# Patient Record
Sex: Male | Born: 1965
Health system: Southern US, Community
[De-identification: ages and names within clinical notes are randomized; demographics above are authoritative.]

## PROBLEM LIST (undated history)

## (undated) DIAGNOSIS — F1020 Alcohol dependence, uncomplicated: Secondary | ICD-10-CM

## (undated) DIAGNOSIS — F419 Anxiety disorder, unspecified: Secondary | ICD-10-CM

## (undated) DIAGNOSIS — F32A Depression, unspecified: Secondary | ICD-10-CM

---

## 1999-09-06 ENCOUNTER — Emergency Department (HOSPITAL_COMMUNITY): Admission: EM | Admit: 1999-09-06 | Discharge: 1999-09-06 | Payer: Self-pay | Admitting: Emergency Medicine

## 1999-09-06 ENCOUNTER — Encounter: Payer: Self-pay | Admitting: Emergency Medicine

## 1999-09-15 ENCOUNTER — Emergency Department (HOSPITAL_COMMUNITY): Admission: EM | Admit: 1999-09-15 | Discharge: 1999-09-15 | Payer: Self-pay | Admitting: Emergency Medicine

## 2000-05-25 ENCOUNTER — Encounter: Payer: Self-pay | Admitting: Emergency Medicine

## 2000-05-25 ENCOUNTER — Emergency Department (HOSPITAL_COMMUNITY): Admission: EM | Admit: 2000-05-25 | Discharge: 2000-05-25 | Payer: Self-pay | Admitting: Emergency Medicine

## 2004-06-05 ENCOUNTER — Emergency Department (HOSPITAL_COMMUNITY): Admission: EM | Admit: 2004-06-05 | Discharge: 2004-06-05 | Payer: Self-pay | Admitting: Emergency Medicine

## 2009-07-21 ENCOUNTER — Emergency Department (HOSPITAL_COMMUNITY): Admission: EM | Admit: 2009-07-21 | Discharge: 2009-07-21 | Payer: Self-pay | Admitting: Emergency Medicine

## 2009-08-02 ENCOUNTER — Emergency Department (HOSPITAL_COMMUNITY): Admission: EM | Admit: 2009-08-02 | Discharge: 2009-08-03 | Payer: Self-pay | Admitting: Emergency Medicine

## 2009-11-03 ENCOUNTER — Emergency Department (HOSPITAL_COMMUNITY): Admission: EM | Admit: 2009-11-03 | Discharge: 2009-11-04 | Payer: Self-pay | Admitting: Emergency Medicine

## 2009-11-04 ENCOUNTER — Ambulatory Visit: Payer: Self-pay | Admitting: Psychiatry

## 2009-11-04 ENCOUNTER — Inpatient Hospital Stay (HOSPITAL_COMMUNITY): Admission: AD | Admit: 2009-11-04 | Discharge: 2009-11-09 | Payer: Self-pay | Admitting: Psychiatry

## 2010-06-26 LAB — COMPREHENSIVE METABOLIC PANEL
ALT: 35 U/L (ref 0–53)
Alkaline Phosphatase: 59 U/L (ref 39–117)
CO2: 26 mEq/L (ref 19–32)
Chloride: 100 mEq/L (ref 96–112)
Glucose, Bld: 113 mg/dL — ABNORMAL HIGH (ref 70–99)
Potassium: 3.9 mEq/L (ref 3.5–5.1)
Sodium: 138 mEq/L (ref 135–145)
Total Bilirubin: 0.1 mg/dL — ABNORMAL LOW (ref 0.3–1.2)
Total Protein: 8.6 g/dL — ABNORMAL HIGH (ref 6.0–8.3)

## 2010-06-26 LAB — RAPID URINE DRUG SCREEN, HOSP PERFORMED
Benzodiazepines: NOT DETECTED
Cocaine: NOT DETECTED
Tetrahydrocannabinol: NOT DETECTED

## 2010-06-26 LAB — CBC
HCT: 40.7 % (ref 39.0–52.0)
Hemoglobin: 14.4 g/dL (ref 13.0–17.0)
RBC: 4.56 MIL/uL (ref 4.22–5.81)
WBC: 4.3 10*3/uL (ref 4.0–10.5)

## 2010-06-26 LAB — DIFFERENTIAL
Basophils Relative: 1 % (ref 0–1)
Eosinophils Absolute: 0.2 10*3/uL (ref 0.0–0.7)
Monocytes Absolute: 0.5 10*3/uL (ref 0.1–1.0)
Neutrophils Relative %: 16 % — ABNORMAL LOW (ref 43–77)

## 2010-06-26 LAB — ACETAMINOPHEN LEVEL: Acetaminophen (Tylenol), Serum: <10 ug/mL — ABNORMAL LOW (ref 10–30)

## 2010-06-26 LAB — ETHANOL: Alcohol, Ethyl (B): 362 mg/dL — ABNORMAL HIGH (ref 0–10)

## 2010-06-29 LAB — RAPID URINE DRUG SCREEN, HOSP PERFORMED
Amphetamines: NOT DETECTED
Barbiturates: NOT DETECTED
Opiates: POSITIVE — AB

## 2010-06-29 LAB — URINE MICROSCOPIC-ADD ON

## 2010-06-29 LAB — POCT I-STAT, CHEM 8
BUN: 12 mg/dL (ref 6–23)
Chloride: 102 mEq/L (ref 96–112)
Creatinine, Ser: 1 mg/dL (ref 0.4–1.5)
Glucose, Bld: 106 mg/dL — ABNORMAL HIGH (ref 70–99)
Hemoglobin: 14.6 g/dL (ref 13.0–17.0)
Potassium: 3.6 mEq/L (ref 3.5–5.1)
Sodium: 137 mEq/L (ref 135–145)

## 2010-06-29 LAB — URINALYSIS, ROUTINE W REFLEX MICROSCOPIC
Bilirubin Urine: NEGATIVE
Glucose, UA: NEGATIVE mg/dL
Urobilinogen, UA: 0.2 mg/dL (ref 0.0–1.0)
pH: 5.5 (ref 5.0–8.0)

## 2010-06-30 LAB — BASIC METABOLIC PANEL
BUN: 7 mg/dL (ref 6–23)
Creatinine, Ser: 0.94 mg/dL (ref 0.4–1.5)
GFR calc non Af Amer: >60 mL/min (ref >60)
Glucose, Bld: 156 mg/dL — ABNORMAL HIGH (ref 70–99)
Potassium: 3.8 mEq/L (ref 3.5–5.1)

## 2010-06-30 LAB — RAPID URINE DRUG SCREEN, HOSP PERFORMED
Amphetamines: NOT DETECTED
Benzodiazepines: NOT DETECTED
Cocaine: NOT DETECTED
Opiates: NOT DETECTED
Tetrahydrocannabinol: NOT DETECTED

## 2010-06-30 LAB — CBC
HCT: 39.3 % (ref 39.0–52.0)
MCV: 90.3 fL (ref 78.0–100.0)
Platelets: 78 10*3/uL — ABNORMAL LOW (ref 150–400)
RDW: 15.1 % (ref 11.5–15.5)
WBC: 2.6 10*3/uL — ABNORMAL LOW (ref 4.0–10.5)

## 2010-06-30 LAB — DIFFERENTIAL
Basophils Absolute: 0.1 10*3/uL (ref 0.0–0.1)
Lymphs Abs: 1.6 10*3/uL (ref 0.7–4.0)
Monocytes Absolute: 0.3 10*3/uL (ref 0.1–1.0)
Monocytes Relative: 11 % (ref 3–12)
Neutrophils Relative %: 15 % — ABNORMAL LOW (ref 43–77)
Smear Review: DECREASED

## 2010-06-30 LAB — ETHANOL: Alcohol, Ethyl (B): 391 mg/dL — ABNORMAL HIGH (ref 0–10)

## 2010-06-30 LAB — PATHOLOGIST SMEAR REVIEW

## 2011-09-22 ENCOUNTER — Emergency Department (HOSPITAL_COMMUNITY)
Admission: EM | Admit: 2011-09-22 | Discharge: 2011-09-22 | Discharge status: Against Medical Advice | Payer: Self-pay | Attending: Emergency Medicine | Admitting: Emergency Medicine

## 2011-09-22 ENCOUNTER — Encounter (HOSPITAL_COMMUNITY): Payer: Self-pay | Admitting: Emergency Medicine

## 2011-09-22 DIAGNOSIS — Z0389 Encounter for observation for other suspected diseases and conditions ruled out: Secondary | ICD-10-CM | POA: Insufficient documentation

## 2011-09-22 NOTE — ED Notes (Signed)
Pt not in room when this nurse went to assess him. Charge nurse reported that if he walked out again he would be escorted off premises by security, which he did. Pt was ambulatory at time of leaving.

## 2011-09-22 NOTE — ED Notes (Signed)
Pt presents via EMS intoxicated with the hiccups, and n/v. Pt wants to leave. No active vomiting.

## 2013-11-27 ENCOUNTER — Ambulatory Visit: Payer: Self-pay

## 2013-12-12 ENCOUNTER — Ambulatory Visit: Payer: Self-pay

## 2014-06-14 ENCOUNTER — Emergency Department (HOSPITAL_COMMUNITY): Payer: Self-pay

## 2014-06-14 ENCOUNTER — Emergency Department (HOSPITAL_COMMUNITY)
Admission: EM | Admit: 2014-06-14 | Discharge: 2014-06-14 | Disposition: A | Discharge status: Home / Self Care | Payer: Self-pay | Attending: Emergency Medicine | Admitting: Emergency Medicine

## 2014-06-14 ENCOUNTER — Encounter (HOSPITAL_COMMUNITY): Payer: Self-pay | Admitting: Emergency Medicine

## 2014-06-14 DIAGNOSIS — Y9389 Activity, other specified: Secondary | ICD-10-CM | POA: Insufficient documentation

## 2014-06-14 DIAGNOSIS — Y9289 Other specified places as the place of occurrence of the external cause: Secondary | ICD-10-CM | POA: Insufficient documentation

## 2014-06-14 DIAGNOSIS — Z720 Tobacco use: Secondary | ICD-10-CM | POA: Insufficient documentation

## 2014-06-14 DIAGNOSIS — S82099A Other fracture of unspecified patella, initial encounter for closed fracture: Secondary | ICD-10-CM | POA: Insufficient documentation

## 2014-06-14 DIAGNOSIS — Y990 Civilian activity done for income or pay: Secondary | ICD-10-CM | POA: Insufficient documentation

## 2014-06-14 DIAGNOSIS — W1830XA Fall on same level, unspecified, initial encounter: Secondary | ICD-10-CM | POA: Insufficient documentation

## 2014-06-14 DIAGNOSIS — S82001A Unspecified fracture of right patella, initial encounter for closed fracture: Secondary | ICD-10-CM

## 2014-06-14 MED ORDER — HYDROCODONE-ACETAMINOPHEN 5-325 MG PO TABS: 1.0000 | ORAL_TABLET | Freq: Four times a day (QID) | ORAL | Status: DC | PRN

## 2014-06-14 MED ORDER — IBUPROFEN 800 MG PO TABS: 800.0000 mg | ORAL_TABLET | Freq: Three times a day (TID) | ORAL | Status: DC | PRN

## 2014-06-14 NOTE — ED Notes (Signed)
Pt c/o rigth knee pain and swelling after falling last week.  Pt ambulated to triage room.

## 2014-06-14 NOTE — ED Notes (Signed)
Ortho tech at bedside 

## 2014-06-14 NOTE — Discharge Instructions (Signed)
Patellar Fracture with Rehab Patellar fracture is a break in the kneecap (patella). These fractures may be complete or incomplete. It is common for injuries that cause a patellar fracture to also cause a tear (sprain) of the ligaments or ligament-like tissue (retinaculum), or a tear (strain) in the patellar tendon. SYMPTOMS   Severe pain in the knee at the time of injury.  Tenderness and swelling in the knee.  Pain with movement of the knee.  Inability to straighten a bent knee by its own power.  Catching or locking of the knee.  Bleeding and bruising (contusion) in the knee.  Difficulty bearing weight on the injured limb, especially when trying to get up from a sitting position, go up or down stairs, or jump.  Visible deformity, if the bone fragments are out of alignment (displaced).  Numbness and coldness in the leg and foot beyond the fracture site, if the blood supply is impaired. CAUSES  Patellar fractures occur when a force is placed on the kneecap that is greater than it can handle. Common causes of injury include:  Direct trauma to the patella.  Indirect stress caused by twisting or bending. RISK INCREASES WITH:   Contact sports (football, hockey, soccer).  Basketball.  Motor sports.  Bony abnormalities or diseases of the bone (osteoporosis, bone tumor).  Metabolism disorders, hormone problems, nutrition deficiencies and disorders (anorexia or bulimia).  Poor strength and flexibility. PREVENTION  Warm up and stretch properly before activity.  Maintain physical fitness:  Strength, flexibility, and endurance.  Cardiovascular fitness.  Wear properly fitted and padded protective equipment (knee pads). PROGNOSIS  If treated properly, patellar fractures usually heal.  RELATED COMPLICATIONS   Fracture fails to heal (nonunion).  Fracture heals in a poor position (malunion).  Bone death (necrosis), due to interruption of blood supply to the bone.  Stopping  of normal bone growth in children.  Risks of surgery: infection, bleeding, injury to nerves (numbness, weakness, paralysis), need for further surgery, and pain from the wires or screws used to fix the fracture.  Infection in the skin, broken over the fracture site (open fractures).  Arthritic knee joint.  Longer healing time, if activity is resumed too soon.  Proneness to repeated knee injury.  After healing, risk of roughened contact surface of the kneecap, causing pain with sitting, when rising from a sitting position, when going up or down stairs or hills, and when jumping or running.  Stiff knee.  Unstable kneecap. TREATMENT  Treatment first involves the use of ice and medicine to reduce pain and inflammation. If the bone fragments are out of alignment (displaced), immediate realigning of the bone (reduction) by a person trained in the procedure is required. Fractures that cannot be realigned by hand, or are open (bones protrude through the skin), may require surgery to hold the fracture in place with screws, pins, and plates. Once the bone is in proper alignment, restraining the knee is often needed to allow for healing. After restraint, it is important to perform strengthening and stretching exercises to help regain strength and a full range of motion. These exercises may be completed at home or with a therapist. MEDICATION   If pain medicine is needed, nonsteroidal anti-inflammatory medicines (aspirin and ibuprofen), or other minor pain relievers (acetaminophen), are often advised.  Do not take pain medicine for 7 days before surgery.  Prescription pain relievers may be given if your caregiver thinks they are needed. Use only as directed and only as much as you need. SEEK   MEDICAL CARE IF:  Symptoms get worse or do not improve in 2 weeks, despite treatment.  The following occur after restraint or surgery. (Report any of these signs immediately):  Swelling above or below the  fracture site.  Severe, persistent pain.  Blue or gray skin below the fracture site or in the toes. Numbness or loss of feeling below the fracture site.  New, unexplained symptoms develop. (Drugs used in treatment may produce side effects.) EXERCISES RANGE OF MOTION (ROM) AND STRETCHING EXERCISES - Patellar Fracture Your fractured patella may be corrected with surgery and/or with a knee brace. Upon your caregiver's instructions, you may begin your rehabilitation with some of the following exercises. As you complete these, remember:   These initial exercises are intended to be gentle. They will help you restore motion without increasing any swelling.  Completing these exercises allows less painful movement and prepares you for the more aggressive strengthening exercises in Phase II.  An effective stretch should be held for at least 30 seconds.  A stretch should never be painful. You should only feel a gentle lengthening or release in the stretched tissue. RANGE OF MOTION - Knee Flexion and Extension, Active-Assisted  Sit on the edge of a table or chair with your thighs firmly supported. It may be helpful to place a folded towel under the end of your right / left thigh.  Flexion (bending): Place the ankle of your healthy leg on top of the other ankle. Use your healthy leg to gently bend your right / left knee until you feel a mild tension across the top of your knee.  Hold for __________ seconds.  Extension (straightening): Switch your ankles so your right / left leg is on top. Use your healthy leg to straighten your right / left knee until you feel a mild tension on the backside of your knee.  Hold for __________ seconds. Repeat __________ times. Complete this exercise __________ times per day. RANGE OF MOTION - Knee Flexion, Active  Lie on your back with both knees straight. (If this causes back discomfort, bend your opposite knee, placing your foot flat on the floor.)  Slowly slide  your heel back toward your buttocks until you feel a gentle stretch in the front of your knee or thigh.  Hold for __________ seconds. Slowly slide your heel back to the starting position. Repeat __________ times. Complete this exercise __________ times per day.  STRETCH - Knee Flexion, Supine  Lie on the floor with your right / left heel and foot lightly touching the wall. (Place both feet on the wall if you do not use a door frame.)  Without using any effort, allow gravity to slide your foot down the wall slowly until you feel a gentle stretch in the front of your right / left knee.  Hold this stretch for __________ seconds. Then return the leg to the starting position, using your health leg for help, if needed. Repeat __________ times. Complete this stretch __________ times per day.  STRETCH - Knee Extension Sitting  Sit with your right / left leg/heel propped on another chair, coffee table, or foot stool.  Allow your leg muscles to relax, letting gravity straighten out your knee.*  You should feel a stretch behind your right / left knee. Hold this position for __________ seconds. Repeat __________ times. Complete this stretch __________ times per day.  *Your physician, physical therapist or athletic trainer may instruct you place a __________ weight on your thigh, just above your kneecap,   to deepen the stretch.  STRETCH - Knee Extension, Prone  Lie on your stomach on a firm surface, such as a bed or countertop. Place your right / left knee and leg just beyond the edge of the surface. You may wish to place a towel under the far end of your right / left thigh for comfort.  Relax your leg muscles and allow gravity to straighten your knee. Your caregiver may advise you to add an ankle weight if more resistance is helpful for you.  You should feel a stretch in the back of your right / left knee. Hold this position for __________ seconds. Repeat __________ times. Complete this __________  times per day. *Your physician, physical therapist or athletic trainer may instruct you place a __________ weight on your ankle to deepen the stretch.  STRENGTHENING EXERCISES - Patellar Fracture  Regardless of whether your physician has stabilized your knee with surgery or a brace, he or she may have you begin some of the following exercises to rehabilitate your injury. The exercises may resolve your symptoms with or without further involvement from your physician, physical therapist, or athletic trainer. While completing these exercises, remember:   Muscles can gain both the endurance and the strength needed for everyday activities through controlled exercises.  Complete these exercises as instructed by your physician, physical therapist, or athletic trainer. Increase the resistance and repetitions only as guided by your caregiver. STRENGTH - Quadriceps, Isometrics  Lie on your back with your right / left leg extended and your opposite knee bent.  Gradually tense the muscles in the front of your right / left thigh. You should see either your kneecap slide up toward your hip or increased dimpling just above the knee. This motion will push the back of the knee down toward the floor, mat, or bed on which you are lying.  Hold the muscle as tight as you can, without increasing your pain, for __________ seconds.  Relax the muscles slowly and completely in between each repetition. Repeat __________ times. Complete this exercise __________ times per day.  STRENGTH - Quadriceps, Short Arcs  Lie on your back. Place a __________ inch towel roll under your right / left knee, so that the knee bends slightly.  Raise only your lower leg by tightening the muscles in the front of your thigh. Do not allow your thigh to rise.  Hold this position for __________ seconds. Repeat __________ times. Complete this exercise __________ times per day.  OPTIONAL ANKLE WEIGHTS: Begin with ____________________, but DO  NOT exceed ____________________. Increase in 1 lb/0.5 kg increments.  STRENGTH - Quadriceps, Straight Leg Raises Quality counts! Watch for signs that the quadriceps muscle is working, to be sure you are strengthening the correct muscles and not "cheating" by substituting with healthier muscles.  Lay on your back with your right / left leg extended and your opposite knee bent.  Tense the muscles in the front of your right / left thigh. You should see either your kneecap slide up or increased dimpling just above the knee. Your thigh may even shake a bit.  Tighten these muscles even more and raise your leg 4 to 6 inches off the floor. Hold for __________ seconds.  Keeping these muscles tense, lower your leg.  Relax the muscles slowly and completely between each repetition. Repeat __________ times. Complete this exercise __________ times per day.  STRENGTH - Hamstring, Isometrics  Lie on your back on a firm surface.  Bend your right / left knee   approximately __________ degrees.  Dig your heel into the surface as if you are trying to pull it toward your buttocks. Tighten the muscles in the back of your thighs to "dig" as hard as you can without increasing any pain.  Hold this position for __________ seconds.  Release the tension gradually and allow your muscle to completely relax for __________ seconds between each exercise. Repeat __________ times. Complete this exercise __________ times per day.  STRENGTH - Hamstring, Curls  Lay on your stomach with your legs extended. (If you lay on a bed, your feet may hang over the edge.)  Tighten the muscles in the back of your thigh to bend your right / left knee up to 90 degrees. Keep your hips flat on the bed.  Hold this position for __________ seconds.  Slowly lower your leg back to the starting position. Repeat __________ times. Complete this exercise __________ times per day.  STRENGTH - Hip Extensors, Bridge  Lie on your back on a firm  surface. Bend your knees and place your feet flat on the floor.  Tighten your buttocks muscles and lift your bottom off the floor until your trunk is level with your thighs. You should feel the muscles in your buttocks and back of your thighs working. If you do not feel these muscles, slide your feet 1-2 inches further away from your buttocks.  Hold this position for __________ seconds.  Slowly lower your hips to the starting position and allow your buttock muscles relax completely before beginning the next repetition.  If this exercise is too easy, you may cross your arms over your chest. Repeat __________ times. Complete this exercise __________ times per day.  STRENGTH - Hip Abductors, Straight Leg Raises Be aware of your form throughout the entire exercise, so that you exercise the correct muscles. Poor form means that you are not strengthening the correct muscles.  Lie on your side so that your head, shoulders, knee and hip line up. You may bend your lower knee to help maintain your balance. Your right / left leg should be on top.  Roll your hips slightly forward, so that your hips are stacked directly over each other and your right / left knee is facing forward.  Lift your top leg up 4 to 6 inches, leading with your heel. Be sure that your foot does not drift forward or that your knee does not roll toward the ceiling.  Hold this position for __________ seconds. You should feel the muscles in your outer hip lifting. (You may not notice this until your leg begins to tire.)  Slowly lower your leg to the starting position. Allow the muscles to fully relax before beginning the next repetition. Repeat __________ times. Complete this exercise __________ times per day.  STRENGTH - Hip Abductors, Quadruped  On a firm, lightly padded surface, position yourself on your hands and knees. Your hands should be directly below your shoulders and your knees should be directly below your hips.  Keeping  your right / left knee bent, lift your leg out to the side. Keep your legs level and in line with your shoulders.  Hold for __________ seconds.  Keeping your trunk steady and your hips level, slowly lower your leg to the starting position. Repeat __________ times. Complete this exercise __________ times per day.  STRENGTH - Quadriceps, Squats  Stand in a door frame so that your feet and knees are in line with the frame.  Use your hands for balance, not   support, on the frame.  Slowly lower your weight, bending at the hips and knees. Keep your lower legs upright so that they are parallel with the door frame. Squat only within the range that does not increase your knee pain. Never let your hips drop below your knees.  Slowly return upright, pushing with your legs, not pulling with your hands. Repeat __________ times. Complete this exercise __________ times per day.  STRENGTH - Plantar-flexors, Standing  Stand with your feet shoulder width apart. Steady yourself with a wall or table, using as little support as needed.  Keeping your weight evenly spread over the width of your feet, rise up on your toes.*  Hold this position for __________ seconds. Repeat exercise __________ times, __________ times per day.  *If this is too easy, shift your weight toward your right / left leg until you feel challenged. Ultimately, you may be asked to do this exercise while standing on your right / left foot only. Document Released: 03/28/2005 Document Revised: 08/12/2013 Document Reviewed: 07/10/2008 [employer] Patient Information 2015 [employer]. This information is not intended to replace advice given to you by your health care provider. Make sure you discuss any questions you have with your health care provider.  

## 2014-06-14 NOTE — ED Provider Notes (Signed)
CSN: [id number]     Arrival date & time 06/14/14  1510 History  This chart was scribed for non-physician practitioner, Jake Helper, PA-C,working with Flint Melter, MD, by Karle Plumber, ED Scribe. This patient was seen in room WTR9/WTR9 and the patient's care was started at 4:59 PM.  Chief Complaint  Patient presents with  . Knee Pain   Patient is a 49 y.o. male presenting with knee pain. The history is provided by the patient and medical records. No language interpreter was used.  Knee Pain   HPI Comments:  Jake Nguyen is a 49 y.o. male who presents to the Emergency Department complaining of severe right knee pain and swelling secondary to falling on the knee from a standing position on to concrete at work approximately six days ago. He reports associated stiffness of the knee while sleeping at night. Walking makes the pain worse. Denies alleviating factors. He applied ice and warm compresses with minimal relief of the symptoms. He denies right ankle or right hip pain, numbness, tingling or weakness of the LLE.  History reviewed. No pertinent past medical history. History reviewed. No pertinent past surgical history. No family history on file. History  Substance Use Topics  . Smoking status: Current Every Day Smoker    Types: Cigarettes  . Smokeless tobacco: Not on file  . Alcohol Use: Yes    Review of Systems  Musculoskeletal: Positive for joint swelling and arthralgias.  Skin: Negative for color change and wound.  Neurological: Negative for weakness and numbness.    Allergies  Review of patient's allergies indicates no known allergies.  Home Medications   Prior to Admission medications   Not on File   Triage Vitals: BP 139/95 mmHg  Pulse 91  Temp(Src) 98.1 F (36.7 C) (Oral)  Resp 17  Ht  (1.778 m)  Wt 163 lb (73.936 kg)  BMI 23.39 kg/m2  SpO2 100% Physical Exam  Constitutional: He is oriented to person, place, and time. He appears well-developed and  well-nourished.  HENT:  Head: Normocephalic and atraumatic.  Eyes: EOM are normal.  Neck: Normal range of motion.  Cardiovascular: Normal rate and intact distal pulses.   Pulmonary/Chest: Effort normal.  Musculoskeletal: Normal range of motion. He exhibits tenderness.  No joint laxity of right knee. Tender to palpation of both medial and lateral right knee. No tenderness over patella. No gross deformity. Normal flexion and extension of right knee. Normal right hip and ankle.   Neurological: He is alert and oriented to person, place, and time.  Skin: Skin is warm and dry.  Psychiatric: He has a normal mood and affect. His behavior is normal.  Nursing note and vitals reviewed.   ED Course  Procedures (including critical care time) DIAGNOSTIC STUDIES: Oxygen Saturation is 100% on RA, normal by my interpretation.   COORDINATION OF CARE: 5:06 PM- Informed pt of fractured patella. Will order crutches and knee immobilizer and give ortho referral. Pt verbalizes understanding and agrees to plan.  Medications - No data to display  Labs Review Labs Reviewed - No data to display  Imaging Review Dg Knee Complete 4 Views Right  06/14/2014   CLINICAL DATA:  Initial evaluation for pain in right knee around the patella due to a fall 6 days ago at work  EXAM: RIGHT KNEE - COMPLETE 4+ VIEW  COMPARISON:  None.  FINDINGS: Small moderate joint effusion. Vertical defect in the lateral patella consistent with nondisplaced fracture. Prominent nutrient foramen proximal fibula. Tibia, fibula,  and femur otherwise normal.  IMPRESSION: Patellar fracture   Electronically Signed   By: Esperanza Heiraymond  Rubner M.D.   On: 06/14/2014 16:22     EKG Interpretation None      MDM   Final diagnoses:  Right patella fracture, closed, initial encounter    BP 139/95 mmHg  Pulse 91  Temp(Src) 98.1 F (36.7 C) (Oral)  Resp 17  Ht 5\' 10"  (1.778 m)  Wt 163 lb (73.936 kg)  BMI 23.39 kg/m2  SpO2 100%  I have reviewed  nursing notes and vital signs. I personally reviewed the imaging tests through PACS system  I reviewed available ER/hospitalization records thought the EMR   I personally performed the services described in this documentation, which was scribed in my presence. The recorded information has been reviewed and is accurate.    Jake HelperBowie Kamy Poinsett, PA-C 06/14/14 1712  Flint MelterElliott L Wentz, MD 06/14/14 [id number]

## 2014-08-19 ENCOUNTER — Ambulatory Visit: Payer: Self-pay | Attending: Internal Medicine

## 2014-08-25 ENCOUNTER — Ambulatory Visit: Payer: Self-pay

## 2016-02-02 ENCOUNTER — Encounter (HOSPITAL_COMMUNITY): Payer: Self-pay | Admitting: *Deleted

## 2016-02-02 ENCOUNTER — Emergency Department (HOSPITAL_COMMUNITY)
Admission: EM | Admit: 2016-02-02 | Discharge: 2016-02-02 | Discharge status: Against Medical Advice | Payer: Self-pay | Attending: Emergency Medicine | Admitting: Emergency Medicine

## 2016-02-02 DIAGNOSIS — F10120 Alcohol abuse with intoxication, uncomplicated: Secondary | ICD-10-CM | POA: Insufficient documentation

## 2016-02-02 DIAGNOSIS — Z5181 Encounter for therapeutic drug level monitoring: Secondary | ICD-10-CM | POA: Insufficient documentation

## 2016-02-02 DIAGNOSIS — F10920 Alcohol use, unspecified with intoxication, uncomplicated: Secondary | ICD-10-CM

## 2016-02-02 DIAGNOSIS — F17210 Nicotine dependence, cigarettes, uncomplicated: Secondary | ICD-10-CM | POA: Insufficient documentation

## 2016-02-02 LAB — COMPREHENSIVE METABOLIC PANEL
ALBUMIN: 4.7 g/dL (ref 3.5–5.0)
ALT: 16 U/L — AB (ref 17–63)
AST: 31 U/L (ref 15–41)
Alkaline Phosphatase: 54 U/L (ref 38–126)
Anion gap: 10 (ref 5–15)
BUN: 7 mg/dL (ref 6–20)
CHLORIDE: 104 mmol/L (ref 101–111)
CO2: 28 mmol/L (ref 22–32)
CREATININE: 0.72 mg/dL (ref 0.61–1.24)
Calcium: 8.8 mg/dL — ABNORMAL LOW (ref 8.9–10.3)
GFR calc Af Amer: >60 mL/min (ref >60)
GFR calc non Af Amer: >60 mL/min (ref >60)
GLUCOSE: 124 mg/dL — AB (ref 65–99)
POTASSIUM: 3.6 mmol/L (ref 3.5–5.1)
SODIUM: 142 mmol/L (ref 135–145)
Total Bilirubin: 0.3 mg/dL (ref 0.3–1.2)
Total Protein: 8.9 g/dL — ABNORMAL HIGH (ref 6.5–8.1)

## 2016-02-02 LAB — RAPID URINE DRUG SCREEN, HOSP PERFORMED
AMPHETAMINES: NOT DETECTED
BENZODIAZEPINES: NOT DETECTED
Barbiturates: NOT DETECTED
Cocaine: NOT DETECTED
Opiates: NOT DETECTED
TETRAHYDROCANNABINOL: POSITIVE — AB

## 2016-02-02 LAB — CBC
HEMATOCRIT: 41.9 % (ref 39.0–52.0)
HEMOGLOBIN: 14.3 g/dL (ref 13.0–17.0)
MCH: 30.9 pg (ref 26.0–34.0)
MCHC: 34.1 g/dL (ref 30.0–36.0)
MCV: 90.5 fL (ref 78.0–100.0)
PLATELETS: 168 10*3/uL (ref 150–400)
RBC: 4.63 MIL/uL (ref 4.22–5.81)
RDW: 13.8 % (ref 11.5–15.5)
WBC: 5.9 10*3/uL (ref 4.0–10.5)

## 2016-02-02 LAB — ETHANOL
ALCOHOL ETHYL (B): 439 mg/dL — AB (ref <5)
ALCOHOL ETHYL (B): 376 mg/dL — AB (ref <5)

## 2016-02-02 LAB — ACETAMINOPHEN LEVEL: Acetaminophen (Tylenol), Serum: <10 ug/mL — ABNORMAL LOW (ref 10–30)

## 2016-02-02 LAB — SALICYLATE LEVEL: Salicylate Lvl: <7 mg/dL (ref 2.8–30.0)

## 2016-02-02 MED ORDER — ONDANSETRON HCL 4 MG PO TABS: 4.0000 mg | ORAL_TABLET | Freq: Three times a day (TID) | ORAL | Status: DC | PRN

## 2016-02-02 MED ORDER — ALUM & MAG HYDROXIDE-SIMETH 200-200-20 MG/5ML PO SUSP: 30.0000 mL | ORAL | Status: DC | PRN

## 2016-02-02 MED ORDER — LORAZEPAM 1 MG PO TABS: 1.0000 mg | ORAL_TABLET | Freq: Three times a day (TID) | ORAL | Status: DC | PRN

## 2016-02-02 MED ORDER — LORAZEPAM 1 MG PO TABS: 0.0000 mg | ORAL_TABLET | Freq: Two times a day (BID) | ORAL | Status: DC

## 2016-02-02 MED ORDER — NICOTINE 21 MG/24HR TD PT24: 21.0000 mg | MEDICATED_PATCH | Freq: Every day | TRANSDERMAL | Status: DC

## 2016-02-02 MED ORDER — IBUPROFEN 200 MG PO TABS: 600.0000 mg | ORAL_TABLET | Freq: Three times a day (TID) | ORAL | Status: DC | PRN

## 2016-02-02 MED ORDER — LORAZEPAM 1 MG PO TABS
0.0000 mg | ORAL_TABLET | Freq: Four times a day (QID) | ORAL | Status: DC
Start: 2016-02-02 — End: 2016-02-02
  Administered 2016-02-02: 2 mg via ORAL
  Filled 2016-02-02: qty 2

## 2016-02-02 NOTE — ED Notes (Signed)
Pt is changed into scrubs, belongings collected and wanded by security.

## 2016-02-02 NOTE — BH Assessment (Addendum)
Assessment Note  Jake Nguyen is an 50 y.o. male. He presents to [facility] via EMS called by his girlfriend. Patient and girlfriend live in a hotel room together. Girlfriend contacted 911/EMS after patient voiced that he wanted to "End it all". Patient presents to the [facility] with both suicidal ideations and intoxicated. BAL upon arrival was 439.  Onset of suicidal thoughts started 2-3 days. No plan or intent. No history of suicidal ideations; thoughts only. No history of self mutilating behaviors. Patient admits that he has felt depressed with symptoms of hopelessness, isolating self from others, crying spells, and anger/irritability. He denies HI. No history of violent or aggressive behaviors. No AVH's per patient. Girlfriend however sts that patient "talks to himself all the time". No current psychiatrist or therapist. Patient has a history of INPT hospitalization at [facility]. Patient admits that he drinks heavily on a daily basis. He started drinking at the age of 29. Last drink was today. Denied drug use but UDS was + for THC.  Diagnosis:  Major Depressive Disorder, Recurrent, Severe, without psychotic features  Past Medical History: History reviewed. No pertinent past medical history.  History reviewed. No pertinent surgical history.  Family History: No family history on file.  Social History:  reports that he has been smoking Cigarettes.  He has been smoking about 0.50 packs per day. He has never used smokeless tobacco. He reports that he drinks alcohol. His drug history is not on file.   CIWA: CIWA-Ar BP: 137/87 Pulse Rate: 75 COWS:    Allergies:  Allergies  Allergen Reactions  . Pork-Derived Products Other (See Comments)    Pt states he doesn't eat pork    Additional Social History:  Alcohol / Drug Use Pain Medications: SEE MAR Prescriptions: SEE MAR Over the Counter: SEE MAR History of alcohol / drug use?: Yes Negative Consequences of Use: Financial, Personal  relationships Withdrawal Symptoms: Agitation, Sweats, Weakness Substance #1 Name of Substance 1: Alcohol  1 - Age of First Use: 50 yrs old 1 - Amount (size/oz): "I drink until I can't drink no more" 1 - Frequency: daily  1 - Duration: "all my life" 1 - Last Use / Amount: 02/02/2016  CIWA: CIWA-Ar BP: 109/76 Pulse Rate: 69 COWS:    Allergies:  Allergies  Allergen Reactions  . Pork-Derived Products Other (See Comments)    Pt states he doesn't eat pork    Home Medications:  (Not in a hospital admission)  OB/GYN Status:  No LMP for male patient.  General Assessment Data [facility] of Assessment: [facility] TTS Assessment: In system Is this a Tele or Face-to-Face Assessment?: Face-to-Face Is this an Initial Assessment or a Re-assessment for this encounter?: Initial Assessment Marital status: Single Maiden name:  (n/a) Is patient pregnant?: No Pregnancy Status: No Living Arrangements: Other (Comment) (lives in hotel with girlfriend ) Can pt return to current living arrangement?: Yes Admission Status: Voluntary Is patient capable of signing voluntary admission?: Yes Referral Source: Self/Family/Friend Insurance type:  (Self Pay )     Crisis Care Plan Living Arrangements: Other (Comment) (lives in hotel with girlfriend ) Legal Guardian: Other: (no legal guardian ) Name of Psychiatrist:  (no psychiatrist ) Name of Therapist:  (no therapist )  Education Status Is patient currently in school?: No Current Grade:  (n/a) Highest grade of school patient has completed:  (college) Name of school:  (n/a) Contact person:  (n/a)  Risk to self with the past 6 months Suicidal Ideation: Yes-Currently Present Has patient been  a risk to self within the past 6 months prior to admission? : Yes Suicidal Intent: No Has patient had any suicidal intent within the past 6 months prior to admission? : Yes Is patient at risk for suicide?: No Suicidal Plan?: No Has patient had any suicidal plan  within the past 6 months prior to admission? : Yes Access to Means: No What has been your use of drugs/alcohol within the last 12 months?:  (heavy alcohol use) Previous Attempts/Gestures: No How many times?:  (0) Other Self Harm Risks:  (denies ) Triggers for Past Attempts: Other (Comment) (no previous attempts or gestures; ideations only ) Intentional Self Injurious Behavior: None Family Suicide History: No Recent stressful life event(s): Other (Comment) ("Everything"; pt would not provide specific responses ) Persecutory voices/beliefs?: No Depression: Yes Depression Symptoms: Feeling angry/irritable, Feeling worthless/self pity, Loss of interest in usual pleasures, Guilt, Fatigue, Tearfulness, Isolating, Despondent, Insomnia Substance abuse history and/or treatment for substance abuse?: No Suicide prevention information given to non-admitted patients: Not applicable  Risk to Others within the past 6 months Homicidal Ideation: No Does patient have any lifetime risk of violence toward others beyond the six months prior to admission? : No Thoughts of Harm to Others: No Current Homicidal Intent: No Current Homicidal Plan: No Access to Homicidal Means: No Identified Victim:  (n/a) History of harm to others?: No Assessment of Violence: None Noted Violent Behavior Description:  (patient is calm and cooperative ) Does patient have access to weapons?: No Criminal Charges Pending?: No Describe Pending Criminal Charges:  (n/a) Does patient have a court date: No Is patient on probation?: No  Psychosis Hallucinations: None noted (Pt denies; Girlfriend sts patient talks to himself all the t) Delusions: None noted  Mental Status Report Appearance/Hygiene: Disheveled Eye Contact: Poor Motor Activity: Freedom of movement Speech: Logical/coherent Level of Consciousness: Alert Mood: Depressed Affect: Appropriate to circumstance Anxiety Level: None Thought Processes: Coherent,  Relevant Judgement: Impaired Orientation: Person, Place, Time, Situation Obsessive Compulsive Thoughts/Behaviors: None  Cognitive Functioning Concentration: Decreased Memory: Remote Intact, Recent Intact IQ: Average Insight: Poor Impulse Control: Poor Appetite: Fair Weight Loss:  (none reported) Weight Gain:  (none reported) Sleep: Decreased Total Hours of Sleep:  ("I don't sleep at all") Vegetative Symptoms: None  ADLScreening [facility] Patient's cognitive ability adequate to safely complete daily activities?: Yes Patient able to express need for assistance with ADLs?: Yes Independently performs ADLs?: Yes (appropriate for developmental age)  Prior Inpatient Therapy Prior Inpatient Therapy: Yes Prior Therapy Dates:  (2011) Prior Therapy Facilty/Provider(s):  ([name]) Reason for Treatment:  (substance abuse, depression, suicidal ideations)  Prior Outpatient Therapy Prior Outpatient Therapy: No Prior Therapy Dates:  (n/a) Prior Therapy Facilty/Provider(s):  (n/a) Reason for Treatment:  (n/a) Does patient have an ACCT team?: No Does patient have Intensive In-House Services?  : No Does patient have [facility] services? : No Does patient have P4CC services?: No  ADL Screening (condition at time of admission) Patient's cognitive ability adequate to safely complete daily activities?: Yes Is the patient deaf or have difficulty hearing?: No Does the patient have difficulty seeing, even when wearing glasses/contacts?: No Does the patient have difficulty concentrating, remembering, or making decisions?: No Patient able to express need for assistance with ADLs?: Yes Does the patient have difficulty dressing or bathing?: No Independently performs ADLs?: Yes (appropriate for developmental age) Does the patient have difficulty walking or climbing stairs?: No Weakness of Legs: None Weakness of Arms/Hands: None  Home Assistive Devices/Equipment Home Assistive  Devices/Equipment:  None    Abuse/Neglect Assessment (Assessment to be complete while patient is alone) Physical Abuse: Denies Verbal Abuse: Denies Sexual Abuse: Denies Exploitation of patient/patient's resources: Denies Self-Neglect: Denies Possible abuse reported to:: [facility] department of social services Values / Beliefs Cultural Requests During Hospitalization: None Spiritual Requests During Hospitalization: None   Advance Directives (For Healthcare) Does patient have an advance directive?: No    Additional Information 1:1 In Past 12 Months?: No CIRT Risk: No Elopement Risk: No Does patient have medical clearance?: Yes     Disposition: Nanine Means, DNP recommended Obs unit at [facility]; patient declined and requested to discharge home. Patient discharged home AMA.  Disposition Initial Assessment Completed for this Encounter: Yes Disposition of Patient:  Type of inpatient treatment program:  Nanine Means, DNP recommends OBS treatment)   On Site Evaluation by:   Reviewed with Physician:  Nanine Means, DNP  Melynda Ripple [facility] 02/02/2016 3:46 PM

## 2016-02-02 NOTE — Progress Notes (Signed)
Entered in d/c instructions Please use the resources provided to you in emergency room by case manager to assist you're your choice of doctor for follow up     These [place] uninsured resources provide possible primary care providers, resources for discounted medications, housing, dental resources, affordable care act information, plus other resources for [place]    Next Steps: Schedule an appointment as soon as possible for a visit today  Instructions: A referral for you has been sent to [facility] if you have not received a call in 3 days you may contact them Call Karen Andrianos at [phone] Tuesday-Friday [email or web address]    

## 2016-02-02 NOTE — ED Notes (Addendum)
This RN went and got patient's girlfriend from lobby so he could talk to her and Patient still conflicted about wanting to leave or stay.  Patient and girlfriend talking at bedside.

## 2016-02-02 NOTE — Progress Notes (Signed)
CM spoke with pt who [place]cp.  CM discussed and provided written information to assist pt with determining choice for uninsured accepting pcps, discussed the importance of pcp vs EDP services for f/u care, www.needymeds.org, www.goodrx.com, discounted pharmacies and other [place] resources such as [facility] , [facility], affordable care act, financial assistance, uninsured dental services, [place] [facility] assist, DSS and  health department  Reviewed resources for [place] uninsured accepting pcps like [facility], family medicine at [address], [facility], [facility], local urgent care centers, [facility], [facility] family practice, general medical clinics, family services of the [place], [facility] urgent care plus others, medication resources, [facility] out patient pharmacies and housing Pt voiced understanding and appreciation of resources provided   Provided P4CC contact information Pt agreed to a referral Cm completed referral to get a his old [place] card  Pt to be contact by [facility] clinical liaison

## 2016-02-02 NOTE — ED Notes (Signed)
Patient ambulated with girlfriend and [place] with TTS to consult room.

## 2016-02-02 NOTE — Progress Notes (Signed)
Patient ID: Jake Nguyen, male   DOB: 02/01/1966, 50 y.o.   MRN: [id number]  Patient fully assessed by TTS counselor.  He initially presented with passive suicidal ideations but was intoxicated, no plan or intent.  Jake [facility] and agreed to go but decided he wanted to leave.  Multiple attempts to leave despite redirection.  Snacks and drinks provided.  When he started demanding to leave, he signed AMA paper work and left with his girlfriend.  Not vested in treatment at this time.    [name], PMH-NP

## 2016-02-02 NOTE — ED Notes (Signed)
Patient getting agitated.  Patient yelling at girlfriend and trying to grab her purse to get a cigarette.  Girlfriend got up and walked off.  Made Jamison with TTS aware.

## 2016-02-02 NOTE — ED Notes (Signed)
[facility] Expected date:  Expected time:  Means of arrival:  Comments: 

## 2016-02-02 NOTE — ED Notes (Signed)
Patient states that he wants to leave. Made Catha NottinghamJamison with TTS aware and stated that patient could leave AMA if he wants to.   Patient signed AMA form.   Patient then states that he just wants to speak to girlfriend and he does want to stay.   RN working on Therapist, artgetting registration to send girlfriend back from lobby.

## 2016-02-02 NOTE — ED Notes (Signed)
Patient and girlfriend asking for patient's clothes because patient states that he is leaving.  This RN asked patient if he was serious this time about leaving.  Patient stated he was and wanted his clothing.  Patient was given his clothes in patient belonging bag and he left with girlfriend,.  Patient did sign AMA form.

## 2016-02-02 NOTE — ED Notes (Signed)
Patient wanting to go home.  Made Jyl Heinzoyka and Jamison with TTS aware.  Catha NottinghamJamison will be coming to see patient.

## 2016-02-02 NOTE — ED Notes (Signed)
Family at bedside. 

## 2016-02-02 NOTE — ED Provider Notes (Signed)
[facility]-EM[facility]ENCY DEPT Provider Note   CSN: [id number] Arrival date & time: 02/02/16  1129     History   Chief Complaint Chief Complaint  Patient presents with  . Psychiatric Evaluation  . Alcohol Intoxication    HPI Jake Nguyen is a 50 y.o. male.  HPI   Patient brought in by girlfriend for worsening psychiatric symptoms over the past month.   Has has been talking to people who are not present, has been saying things that do not connect to the conversation, has bad nightmares about people trying to kill him or him trying to kill others.  Has become depressed and cries a lot, stating he wants to kill himself.  History obtained mostly from girlfriend of 3 years.   Patient sleeping soundly, intoxicated.  Wakes up and answers questions.  States he wants to put a knife in his heart.    Level 5 caveat for intoxication, psychiatric disorder.   History reviewed. No pertinent past medical history.  There are no active problems to display for this patient.   History reviewed. No pertinent surgical history.     Home Medications    Prior to Admission medications   Not on File    Family History No family history on file.  Social History Social History  Substance Use Topics  . Smoking status: Current Every Day Smoker    Packs/day: 0.50    Types: Cigarettes  . Smokeless tobacco: Never Used  . Alcohol use Yes     Allergies   Pork-derived products   Review of Systems Review of Systems  Unable to perform ROS: Psychiatric disorder     Physical Exam Updated Vital Signs BP 109/76 (BP Location: Right Arm)   Pulse 69   Temp 97.7 F (36.5 C) (Oral)   Resp 18   SpO2 96%   Physical Exam  Constitutional: He appears well-developed and well-nourished. He is sleeping. No distress.  Sleeping soundly, somewhat difficult to wake up.  Does wake up and talk, goes right back to sleep. Intoxicated.    HENT:  Head: Normocephalic and atraumatic.  Neck: Neck supple.    Cardiovascular: Normal rate and regular rhythm.   Pulmonary/Chest: Effort normal and breath sounds normal. No respiratory distress. He has no wheezes. He has no rales.  Abdominal: Soft. He exhibits no distension and no mass. There is no tenderness. There is no rebound and no guarding.  Neurological: He exhibits normal muscle tone.  Skin: He is not diaphoretic.  Nursing note and vitals reviewed.    ED Treatments / Results  Labs (all labs ordered are listed, but only abnormal results are displayed) Labs Reviewed  COMPREHENSIVE METABOLIC PANEL - Abnormal; Notable for the following:       Result Value   Glucose, Bld 124 (*)    Calcium 8.8 (*)    Total Protein 8.9 (*)    ALT 16 (*)    All other components within normal limits  ETHANOL - Abnormal; Notable for the following:    Alcohol, Ethyl (B) 439 (*)    All other components within normal limits  ACETAMINOPHEN LEVEL - Abnormal; Notable for the following:    Acetaminophen (Tylenol), Serum <10 (*)    All other components within normal limits  RAPID URINE DRUG SCREEN, HOSP PERFORMED - Abnormal; Notable for the following:    Tetrahydrocannabinol POSITIVE (*)    All other components within normal limits  ETHANOL - Abnormal; Notable for the following:    Alcohol, Ethyl (B) 376 (*)  All other components within normal limits  SALICYLATE LEVEL  CBC    EKG  EKG Interpretation None       Radiology No results found.  Procedures Procedures (including critical care time)  Medications Ordered in ED Medications  LORazepam (ATIVAN) tablet 0-4 mg (2 mg Oral Given 02/02/16 1712)    Followed by  LORazepam (ATIVAN) tablet 0-4 mg (not administered)  LORazepam (ATIVAN) tablet 1 mg (not administered)  ibuprofen (ADVIL,MOTRIN) tablet 600 mg (not administered)  nicotine (NICODERM CQ - dosed in mg/24 hours) patch 21 mg (21 mg Transdermal Refused 02/02/16 1723)  ondansetron (ZOFRAN) tablet 4 mg (not administered)  alum & mag  hydroxide-simeth (MAALOX/MYLANTA) 200-200-20 MG/5ML suspension 30 mL (not administered)     Initial Impression / Assessment and Plan / ED Course  I have reviewed the triage vital signs and the nursing notes.  Pertinent labs & imaging results that were available during my care of the patient were reviewed by me and considered in my medical decision making (see chart for details).  Clinical Course    Pt with no known mental health disorders presents after 1 month of presumed visual and auditory hallucinations, depression, crying, threatening to kill himself.  High level of alcohol use.  Pt remained in ED several hours for sobering so that he could be properly assessed by TTS and psychiatry.  After becoming more alert and conversational he was seen by TTS and Nanine Means, NP.  Please see their notes for further information about this patient's assessment.  After he was seen and assessed he was allowed to leave AMA.  As the patient was very intoxicated on my assessment and he said very little, the psychiatric provider's assessment is likely a more accurate reflection of the patient's current state.  I was not made aware when he was asking to leave or when he left AMA.    Final Clinical Impressions(s) / ED Diagnoses   Final diagnoses:  Alcoholic intoxication without complication (HCC)    New Prescriptions There are no discharge medications for this patient.    Trixie Dredge, PA-C 02/02/16 2042    Nira Conn, MD 02/02/16 2156

## 2016-02-02 NOTE — ED Triage Notes (Signed)
Pt bib girlfriend.  Pt appears to be intoxicated and states he doesn't know why he is here and that he is not in pain. Pt's girlfriend states that pt has been having nightmares and hearing voices that tells pt to harm himself.  When asked if pt has SI, pt is unable to answer.

## 2016-06-15 ENCOUNTER — Emergency Department (HOSPITAL_COMMUNITY)
Admission: EM | Admit: 2016-06-15 | Discharge: 2016-06-15 | Disposition: A | Discharge status: Home / Self Care | Payer: Self-pay | Attending: Emergency Medicine | Admitting: Emergency Medicine

## 2016-06-15 ENCOUNTER — Encounter (HOSPITAL_COMMUNITY): Payer: Self-pay | Admitting: Emergency Medicine

## 2016-06-15 DIAGNOSIS — Z5181 Encounter for therapeutic drug level monitoring: Secondary | ICD-10-CM | POA: Insufficient documentation

## 2016-06-15 DIAGNOSIS — F17210 Nicotine dependence, cigarettes, uncomplicated: Secondary | ICD-10-CM | POA: Insufficient documentation

## 2016-06-15 DIAGNOSIS — F10120 Alcohol abuse with intoxication, uncomplicated: Secondary | ICD-10-CM | POA: Insufficient documentation

## 2016-06-15 DIAGNOSIS — F10920 Alcohol use, unspecified with intoxication, uncomplicated: Secondary | ICD-10-CM

## 2016-06-15 HISTORY — DX: Alcohol dependence, uncomplicated: F10.20

## 2016-06-15 LAB — COMPREHENSIVE METABOLIC PANEL
ALBUMIN: 4.2 g/dL (ref 3.5–5.0)
ALK PHOS: 61 U/L (ref 38–126)
ALT: 40 U/L (ref 17–63)
ANION GAP: 10 (ref 5–15)
AST: 98 U/L — ABNORMAL HIGH (ref 15–41)
BILIRUBIN TOTAL: 0.5 mg/dL (ref 0.3–1.2)
BUN: 7 mg/dL (ref 6–20)
CALCIUM: 8.7 mg/dL — AB (ref 8.9–10.3)
CO2: 30 mmol/L (ref 22–32)
Chloride: 99 mmol/L — ABNORMAL LOW (ref 101–111)
Creatinine, Ser: 0.67 mg/dL (ref 0.61–1.24)
GFR calc Af Amer: >60 mL/min (ref >60)
GLUCOSE: 100 mg/dL — AB (ref 65–99)
POTASSIUM: 4.1 mmol/L (ref 3.5–5.1)
Sodium: 139 mmol/L (ref 135–145)
TOTAL PROTEIN: 9.5 g/dL — AB (ref 6.5–8.1)

## 2016-06-15 LAB — ETHANOL: Alcohol, Ethyl (B): 349 mg/dL (ref <5)

## 2016-06-15 LAB — PROTIME-INR
INR: 0.88
PROTHROMBIN TIME: 12 s (ref 11.4–15.2)

## 2016-06-15 LAB — CBC WITH DIFFERENTIAL/PLATELET
BASOS PCT: 1 %
Basophils Absolute: 0 10*3/uL (ref 0.0–0.1)
Eosinophils Absolute: 0 10*3/uL (ref 0.0–0.7)
Eosinophils Relative: 1 %
HEMATOCRIT: 39.8 % (ref 39.0–52.0)
HEMOGLOBIN: 14 g/dL (ref 13.0–17.0)
LYMPHS ABS: 1.2 10*3/uL (ref 0.7–4.0)
LYMPHS PCT: 32 %
MCH: 30.2 pg (ref 26.0–34.0)
MCHC: 35.2 g/dL (ref 30.0–36.0)
MCV: 86 fL (ref 78.0–100.0)
MONO ABS: 0.6 10*3/uL (ref 0.1–1.0)
MONOS PCT: 14 %
NEUTROS ABS: 2.1 10*3/uL (ref 1.7–7.7)
NEUTROS PCT: 53 %
Platelets: 47 10*3/uL — ABNORMAL LOW (ref 150–400)
RBC: 4.63 MIL/uL (ref 4.22–5.81)
RDW: 14.6 % (ref 11.5–15.5)
WBC: 3.9 10*3/uL — ABNORMAL LOW (ref 4.0–10.5)

## 2016-06-15 LAB — POC OCCULT BLOOD, ED: FECAL OCCULT BLD: NEGATIVE

## 2016-06-15 MED ORDER — SODIUM CHLORIDE 0.9 % IV BOLUS (SEPSIS)
1000.0000 mL | Freq: Once | INTRAVENOUS | Status: AC
  Administered 2016-06-15: 1000 mL via INTRAVENOUS

## 2016-06-15 NOTE — ED Provider Notes (Signed)
[facility] Provider Note   CSN: [id number] Arrival date & time: 06/15/16  1312     History   Chief Complaint Chief Complaint  Patient presents with  . Alcohol Intoxication  . Melena    HPI Jake Nguyen is a 51 y.o. male.  Patient with history of alcoholism presents with complaint of alcohol intoxication. Patient is requesting alcohol detox. He also states that he noted red blood in his stool. Last drink was earlier today. Patient denies other substances. Denies other symptoms. States he is currently homeless. States he has had 'problems' after quitting drinking in the past but cannot give details. The onset of this condition was acute. The course is constant. Aggravating factors: none. Alleviating factors: none.         Past Medical History:  Diagnosis Date  . Alcoholism (HCC)     There are no active problems to display for this patient.   History reviewed. No pertinent surgical history.     Home Medications    Prior to Admission medications   Not on File    Family History No family history on file.  Social History Social History  Substance Use Topics  . Smoking status: Current Every Day Smoker    Packs/day: 0.50    Types: Cigarettes  . Smokeless tobacco: Never Used  . Alcohol use Yes     Allergies   Pork-derived products   Review of Systems Review of Systems  Constitutional: Negative for fever.  HENT: Negative for rhinorrhea and sore throat.   Eyes: Negative for redness.  Respiratory: Negative for cough.   Cardiovascular: Negative for chest pain.  Gastrointestinal: Positive for blood in stool. Negative for abdominal pain, diarrhea, nausea and vomiting.  Genitourinary: Negative for dysuria.  Musculoskeletal: Negative for myalgias.  Skin: Negative for rash.  Neurological: Negative for headaches.     Physical Exam Updated Vital Signs BP (!) 139/129   Pulse 109   Temp 98 F (36.7 C) (Oral)   Resp 14   SpO2 99%   Physical  Exam  Constitutional: He appears well-developed and well-nourished.  HENT:  Head: Normocephalic and atraumatic.  Eyes: Conjunctivae are normal. Right eye exhibits no discharge. Left eye exhibits no discharge.  Neck: Normal range of motion. Neck supple.  Cardiovascular: Normal rate, regular rhythm and normal heart sounds.   Pulmonary/Chest: Effort normal and breath sounds normal.  Abdominal: Soft. There is no tenderness.  Genitourinary: Testes normal and penis normal. Rectal exam shows no external hemorrhoid, no mass, no tenderness and guaiac negative stool.  Neurological: He is alert.  Speech is slurred, consistent with intoxication.   Skin: Skin is warm and dry.  Psychiatric: He has a normal mood and affect.  Nursing note and vitals reviewed.    ED Treatments / Results  Labs (all labs ordered are listed, but only abnormal results are displayed) Labs Reviewed  CBC WITH DIFFERENTIAL/PLATELET - Abnormal; Notable for the following:       Result Value   WBC 3.9 (*)    Platelets 47 (*)    All other components within normal limits  COMPREHENSIVE METABOLIC PANEL - Abnormal; Notable for the following:    Chloride 99 (*)    Glucose, Bld 100 (*)    Calcium 8.7 (*)    Total Protein 9.5 (*)    AST 98 (*)    All other components within normal limits  ETHANOL - Abnormal; Notable for the following:    Alcohol, Ethyl (B) 349 (*)  All other components within normal limits  PROTIME-INR  POC OCCULT BLOOD, ED    Procedures Procedures (including critical care time)  Medications Ordered in ED Medications  sodium chloride 0.9 % bolus 1,000 mL (1,000 mLs Intravenous New Bag/Given 06/15/16 1441)     Initial Impression / Assessment and Plan / ED Course  I have reviewed the triage vital signs and the nursing notes.  Pertinent labs & imaging results that were available during my care of the patient were reviewed by me and considered in my medical decision making (see chart for details).       Patient seen and examined. Work-up initiated. Pt is clinically intoxicated.    Vital signs reviewed and are as follows: BP (!) 139/129   Pulse 109   Temp 98 F (36.7 C) (Oral)   Resp 14   SpO2 99%   Rectal performed with RN chaperone [facility]). Hemoccult negative. Hemoglobin normal. Labs are consistent with chronic alcoholism. Alcohol 349.  4:25 PM Informed by nurse that patient eloped. He was smoking in the bathroom x 2.   Final Clinical Impressions(s) / ED Diagnoses   Final diagnoses:  Alcoholic intoxication without complication (HCC)   Pt eloped. Do not suspect GI bleed given exam and work-up.   New Prescriptions New Prescriptions   No medications on file     Renne Crigler, PA-C 06/15/16 1626    Charlynne Pander, MD 06/16/16 [phone]

## 2016-06-15 NOTE — ED Triage Notes (Signed)
Per PT[facility] pt from home reports Hx alcoholism , last drink today. Requests detox. alos reported to [facility] melena noticed today. Alert and oriented x 4.

## 2016-06-15 NOTE — ED Notes (Signed)
Alcohol of 349 result given to Lenox Dalekrame, Charity fundraiserN and Josh, P.A.

## 2016-06-15 NOTE — ED Notes (Signed)
Pt removed his IV and walked out his room out of the department. Left AMA. Josh PA made aware.

## 2016-06-15 NOTE — ED Notes (Signed)
Bed: WA12 Expected date:  Expected time:  Means of arrival:  Comments: EMS ETOH 

## 2017-10-27 ENCOUNTER — Emergency Department (HOSPITAL_COMMUNITY): Payer: Self-pay

## 2017-10-27 ENCOUNTER — Other Ambulatory Visit: Payer: Self-pay

## 2017-10-27 ENCOUNTER — Encounter (HOSPITAL_COMMUNITY): Payer: Self-pay

## 2017-10-27 ENCOUNTER — Emergency Department (HOSPITAL_COMMUNITY)
Admission: EM | Admit: 2017-10-27 | Discharge: 2017-10-28 | Disposition: A | Discharge status: Home / Self Care | Payer: Self-pay | Attending: Emergency Medicine | Admitting: Emergency Medicine

## 2017-10-27 DIAGNOSIS — R4182 Altered mental status, unspecified: Secondary | ICD-10-CM | POA: Insufficient documentation

## 2017-10-27 DIAGNOSIS — F17210 Nicotine dependence, cigarettes, uncomplicated: Secondary | ICD-10-CM | POA: Insufficient documentation

## 2017-10-27 DIAGNOSIS — F10920 Alcohol use, unspecified with intoxication, uncomplicated: Secondary | ICD-10-CM | POA: Insufficient documentation

## 2017-10-27 LAB — COMPREHENSIVE METABOLIC PANEL
ALBUMIN: 3.7 g/dL (ref 3.5–5.0)
ALK PHOS: 69 U/L (ref 38–126)
ALT: 52 U/L — AB (ref 0–44)
AST: 100 U/L — AB (ref 15–41)
Anion gap: 13 (ref 5–15)
BILIRUBIN TOTAL: 0.3 mg/dL (ref 0.3–1.2)
BUN: 6 mg/dL (ref 6–20)
CALCIUM: 8.5 mg/dL — AB (ref 8.9–10.3)
CO2: 27 mmol/L (ref 22–32)
CREATININE: 0.65 mg/dL (ref 0.61–1.24)
Chloride: 102 mmol/L (ref 98–111)
GFR calc Af Amer: >60 mL/min (ref >60)
GFR calc non Af Amer: >60 mL/min (ref >60)
GLUCOSE: 98 mg/dL (ref 70–99)
Potassium: 4.3 mmol/L (ref 3.5–5.1)
Sodium: 142 mmol/L (ref 135–145)
TOTAL PROTEIN: 9 g/dL — AB (ref 6.5–8.1)

## 2017-10-27 LAB — ETHANOL: Alcohol, Ethyl (B): 478 mg/dL (ref <10)

## 2017-10-27 LAB — URINALYSIS, ROUTINE W REFLEX MICROSCOPIC
BILIRUBIN URINE: NEGATIVE
Glucose, UA: NEGATIVE mg/dL
Ketones, ur: NEGATIVE mg/dL
Leukocytes, UA: NEGATIVE
Nitrite: NEGATIVE
PH: 6 (ref 5.0–8.0)
Protein, ur: 30 mg/dL — AB
SPECIFIC GRAVITY, URINE: 1.005 (ref 1.005–1.030)

## 2017-10-27 LAB — CBC WITH DIFFERENTIAL/PLATELET
BASOS PCT: 5 %
Basophils Absolute: 0.2 10*3/uL — ABNORMAL HIGH (ref 0.0–0.1)
EOS ABS: 0.1 10*3/uL (ref 0.0–0.7)
Eosinophils Relative: 3 %
HCT: 40.6 % (ref 39.0–52.0)
HEMOGLOBIN: 13.9 g/dL (ref 13.0–17.0)
LYMPHS ABS: 2.2 10*3/uL (ref 0.7–4.0)
LYMPHS PCT: 54 %
MCH: 29.8 pg (ref 26.0–34.0)
MCHC: 34.2 g/dL (ref 30.0–36.0)
MCV: 86.9 fL (ref 78.0–100.0)
Monocytes Absolute: 0.1 10*3/uL (ref 0.1–1.0)
Monocytes Relative: 3 %
NEUTROS ABS: 1.4 10*3/uL — AB (ref 1.7–7.7)
Neutrophils Relative %: 35 %
Platelets: 65 10*3/uL — ABNORMAL LOW (ref 150–400)
RBC: 4.67 MIL/uL (ref 4.22–5.81)
RDW: 15.3 % (ref 11.5–15.5)
WBC: 4 10*3/uL (ref 4.0–10.5)

## 2017-10-27 MED ORDER — SODIUM CHLORIDE 0.9 % IV BOLUS
1000.0000 mL | Freq: Once | INTRAVENOUS | Status: AC
  Administered 2017-10-27: 1000 mL via INTRAVENOUS

## 2017-10-27 NOTE — ED Triage Notes (Signed)
Pt BIBA for ETOH. Pt denies substance use.

## 2017-10-27 NOTE — ED Notes (Signed)
Pt provided meal tray. Pt asleep, so placed at bedside for him

## 2017-10-27 NOTE — ED Notes (Signed)
Date and time results received: 10/27/17 5:44 PM  Test: Etoh Critical Value: 478  Name of Provider Notified: Dr. Rubin PayorPickering  Orders Received? Or Actions Taken?:N/A

## 2017-10-27 NOTE — ED Notes (Signed)
Bed: [id number] Expected date:  Expected time:  Means of arrival:  Comments: ETOH

## 2017-10-27 NOTE — ED Provider Notes (Addendum)
[facility]-EMERGENCY DEPT Provider Note   CSN: [id number] Arrival date & time: 10/27/17  1545     History   Chief Complaint Chief Complaint  Patient presents with  . Alcohol Intoxication    HPI Jake Nguyen is a 52 y.o. male.  level 5 caveat due to altered mental status.   HPI Patient presents brought in by EMS.  Unable to get history from patient.  Patient states he drank 1 beer.  States he does not know why he is here and frankly does not seem to know where he is.  There is evidence of trauma on his left ear.  He is covered in grass posteriorly. Past Medical History:  Diagnosis Date  . Alcoholism (HCC)     There are no active problems to display for this patient.   History reviewed. No pertinent surgical history.      Home Medications    Prior to Admission medications   Not on File    Family History No family history on file.  Social History Social History   Tobacco Use  . Smoking status: Current Every Day Smoker    Packs/day: 0.50    Types: Cigarettes  . Smokeless tobacco: Never Used  Substance Use Topics  . Alcohol use: Yes  . Drug use: Not on file     Allergies   Pork-derived products   Review of Systems Review of Systems  Unable to perform ROS: Mental status change     Physical Exam Updated Vital Signs BP 116/79   Pulse 78   Temp 98.4 F (36.9 C) (Oral)   Resp 18   Ht 5\' 10"  (1.778 m)   Wt 73.9 kg (163 lb)   SpO2 95%   BMI 23.39 kg/m   Physical Exam  Constitutional: He appears well-developed.  HENT:  Dried blood on top of left ear.  Eyes: Pupils are equal, round, and reactive to light.  Neck:  No cervical spine tenderness.  Cardiovascular: Normal rate.  Pulmonary/Chest: Effort normal.  Abdominal: There is no tenderness.  Musculoskeletal: He exhibits no tenderness.  Neurological:  Patient is laying in bed on his right side.  Will answer some yes or no questions but appears somewhat altered.   Appears to move all his extremities but cannot give a reliable history.  Skin: Skin is warm.     ED Treatments / Results  Labs (all labs ordered are listed, but only abnormal results are displayed) Labs Reviewed  COMPREHENSIVE METABOLIC PANEL - Abnormal; Notable for the following components:      Result Value   Calcium 8.5 (*)    Total Protein 9.0 (*)    AST 100 (*)    ALT 52 (*)    All other components within normal limits  ETHANOL - Abnormal; Notable for the following components:   Alcohol, Ethyl (B) 478 (*)    All other components within normal limits  CBC WITH DIFFERENTIAL/PLATELET - Abnormal; Notable for the following components:   Platelets 65 (*)    Neutro Abs 1.4 (*)    Basophils Absolute 0.2 (*)    All other components within normal limits  URINALYSIS, ROUTINE W REFLEX MICROSCOPIC - Abnormal; Notable for the following components:   Hgb urine dipstick SMALL (*)    Protein, ur 30 (*)    Bacteria, UA RARE (*)    All other components within normal limits    EKG None  Radiology Ct Head Wo Contrast  Result Date: 10/27/2017 CLINICAL DATA:  Altered level of consciousness.  Fall. EXAM: CT HEAD WITHOUT CONTRAST TECHNIQUE: Contiguous axial images were obtained from the base of the skull through the vertex without intravenous contrast. COMPARISON:  08/02/2009 FINDINGS: Brain: The brainstem, cerebellum, cerebral peduncles, thalami, basal ganglia, basilar cisterns, and ventricular system appear within normal limits. No intracranial hemorrhage, mass lesion, or acute CVA. Vascular: Unremarkable Skull: Unremarkable Sinuses/Orbits: Chronic frontal, ethmoid, maxillary, and sphenoid sinusitis. Other: No supplemental non-categorized findings. IMPRESSION: 1. No acute intracranial findings. 2. Chronic paranasal pansinusitis. Electronically Signed   By: Gaylyn Rong M.D.   On: 10/27/2017 18:27    Procedures Procedures (including critical care time)  Medications Ordered in  ED Medications  sodium chloride 0.9 % bolus 1,000 mL (0 mLs Intravenous Stopped 10/27/17 1829)     Initial Impression / Assessment and Plan / ED Course  I have reviewed the triage vital signs and the nursing notes.  Pertinent labs & imaging results that were available during my care of the patient were reviewed by me and considered in my medical decision making (see chart for details).    Patient brought in for reported alcohol intoxication but unable to provide history.  Head CT done due to unclear story.  Head CT reassuring.  Does have a severely elevated alcohol  however.  Continues to be sleepy.  We will continue to monitor and await sobriety.  Care turned over to Dr Nicanor Alcon.  Patient now awake. Will d'c  Final Clinical Impressions(s) / ED Diagnoses   Final diagnoses:  Alcoholic intoxication without complication [facility])    ED Discharge Orders    None       Benjiman Core, MD 10/28/17 Mike Gip    Benjiman Core, MD 10/28/17 [phone]

## 2017-10-28 NOTE — ED Notes (Signed)
Discharge instructions reviewed with pt. Pt verbalized understanding. Pt to follow up with PCP. Pt ambulatory to waiting room.  

## 2017-12-23 ENCOUNTER — Other Ambulatory Visit: Payer: Self-pay

## 2017-12-23 ENCOUNTER — Encounter (HOSPITAL_COMMUNITY): Payer: Self-pay | Admitting: Obstetrics and Gynecology

## 2017-12-23 ENCOUNTER — Emergency Department (HOSPITAL_COMMUNITY)
Admission: EM | Admit: 2017-12-23 | Discharge: 2017-12-23 | Disposition: A | Discharge status: Home / Self Care | Payer: Self-pay | Attending: Emergency Medicine | Admitting: Emergency Medicine

## 2017-12-23 DIAGNOSIS — M25561 Pain in right knee: Secondary | ICD-10-CM | POA: Insufficient documentation

## 2017-12-23 DIAGNOSIS — Z5321 Procedure and treatment not carried out due to patient leaving prior to being seen by health care provider: Secondary | ICD-10-CM | POA: Insufficient documentation

## 2017-12-23 DIAGNOSIS — M25562 Pain in left knee: Secondary | ICD-10-CM | POA: Insufficient documentation
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
## 2017-12-23 NOTE — ED Notes (Signed)
Pt was wandering the department and RN escorted pt back to his room several times.

## 2017-12-23 NOTE — ED Notes (Signed)
Pt reported to RN "I need to go to the lobby" RN told pt if he left the department he would'nt not be seen.  Pt stated "Okay" and walked out.

## 2018-05-20 ENCOUNTER — Emergency Department (HOSPITAL_COMMUNITY): Payer: Self-pay

## 2018-05-20 ENCOUNTER — Other Ambulatory Visit: Payer: Self-pay

## 2018-05-20 ENCOUNTER — Inpatient Hospital Stay (HOSPITAL_COMMUNITY)
Admission: EM | Admit: 2018-05-20 | Discharge: 2018-05-28 | DRG: 557 | Disposition: A | Discharge status: Skilled Nursing Facility | Payer: Self-pay | Attending: Internal Medicine | Admitting: Internal Medicine

## 2018-05-20 ENCOUNTER — Encounter (HOSPITAL_COMMUNITY): Payer: Self-pay

## 2018-05-20 DIAGNOSIS — M10472 Other secondary gout, left ankle and foot: Secondary | ICD-10-CM | POA: Diagnosis present

## 2018-05-20 DIAGNOSIS — E861 Hypovolemia: Secondary | ICD-10-CM | POA: Diagnosis present

## 2018-05-20 DIAGNOSIS — Z789 Other specified health status: Secondary | ICD-10-CM

## 2018-05-20 DIAGNOSIS — R778 Other specified abnormalities of plasma proteins: Secondary | ICD-10-CM

## 2018-05-20 DIAGNOSIS — R7401 Elevation of levels of liver transaminase levels: Secondary | ICD-10-CM

## 2018-05-20 DIAGNOSIS — F10229 Alcohol dependence with intoxication, unspecified: Secondary | ICD-10-CM | POA: Diagnosis present

## 2018-05-20 DIAGNOSIS — Z590 Homelessness: Secondary | ICD-10-CM

## 2018-05-20 DIAGNOSIS — R740 Nonspecific elevation of levels of transaminase and lactic acid dehydrogenase [LDH]: Secondary | ICD-10-CM

## 2018-05-20 DIAGNOSIS — F1010 Alcohol abuse, uncomplicated: Secondary | ICD-10-CM

## 2018-05-20 DIAGNOSIS — L8990 Pressure ulcer of unspecified site, unspecified stage: Secondary | ICD-10-CM

## 2018-05-20 DIAGNOSIS — G9341 Metabolic encephalopathy: Secondary | ICD-10-CM | POA: Diagnosis present

## 2018-05-20 DIAGNOSIS — Z7289 Other problems related to lifestyle: Secondary | ICD-10-CM

## 2018-05-20 DIAGNOSIS — E871 Hypo-osmolality and hyponatremia: Secondary | ICD-10-CM

## 2018-05-20 DIAGNOSIS — F1090 Alcohol use, unspecified, uncomplicated: Secondary | ICD-10-CM

## 2018-05-20 DIAGNOSIS — E860 Dehydration: Secondary | ICD-10-CM

## 2018-05-20 DIAGNOSIS — K7010 Alcoholic hepatitis without ascites: Secondary | ICD-10-CM | POA: Diagnosis present

## 2018-05-20 DIAGNOSIS — D6959 Other secondary thrombocytopenia: Secondary | ICD-10-CM | POA: Diagnosis present

## 2018-05-20 DIAGNOSIS — F17210 Nicotine dependence, cigarettes, uncomplicated: Secondary | ICD-10-CM | POA: Diagnosis present

## 2018-05-20 DIAGNOSIS — J101 Influenza due to other identified influenza virus with other respiratory manifestations: Secondary | ICD-10-CM

## 2018-05-20 DIAGNOSIS — R7989 Other specified abnormal findings of blood chemistry: Secondary | ICD-10-CM

## 2018-05-20 DIAGNOSIS — M79672 Pain in left foot: Secondary | ICD-10-CM

## 2018-05-20 DIAGNOSIS — R4182 Altered mental status, unspecified: Secondary | ICD-10-CM

## 2018-05-20 DIAGNOSIS — R7881 Bacteremia: Secondary | ICD-10-CM | POA: Diagnosis present

## 2018-05-20 DIAGNOSIS — M6282 Rhabdomyolysis: Principal | ICD-10-CM | POA: Diagnosis present

## 2018-05-20 DIAGNOSIS — M10471 Other secondary gout, right ankle and foot: Secondary | ICD-10-CM | POA: Diagnosis present

## 2018-05-20 DIAGNOSIS — E876 Hypokalemia: Secondary | ICD-10-CM | POA: Diagnosis not present

## 2018-05-20 HISTORY — DX: Influenza due to other identified influenza virus with other respiratory manifestations: J10.1

## 2018-05-20 LAB — URINALYSIS, ROUTINE W REFLEX MICROSCOPIC
BILIRUBIN URINE: NEGATIVE
Bacteria, UA: NONE SEEN
Glucose, UA: NEGATIVE mg/dL
Ketones, ur: 80 mg/dL — AB
Leukocytes, UA: NEGATIVE
Nitrite: NEGATIVE
Protein, ur: >=300 mg/dL — AB
Specific Gravity, Urine: 1.024 (ref 1.005–1.030)
pH: 6 (ref 5.0–8.0)

## 2018-05-20 LAB — LACTIC ACID, PLASMA
Lactic Acid, Venous: 3.1 mmol/L (ref 0.5–1.9)
Lactic Acid, Venous: 1.9 mmol/L (ref 0.5–1.9)

## 2018-05-20 LAB — COMPREHENSIVE METABOLIC PANEL
ALT: 128 U/L — AB (ref 0–44)
AST: 704 U/L — AB (ref 15–41)
Albumin: 3.4 g/dL — ABNORMAL LOW (ref 3.5–5.0)
Alkaline Phosphatase: 46 U/L (ref 38–126)
Anion gap: 19 — ABNORMAL HIGH (ref 5–15)
BILIRUBIN TOTAL: 1.5 mg/dL — AB (ref 0.3–1.2)
BUN: 24 mg/dL — AB (ref 6–20)
CO2: 24 mmol/L (ref 22–32)
CREATININE: 0.82 mg/dL (ref 0.61–1.24)
Calcium: 8.1 mg/dL — ABNORMAL LOW (ref 8.9–10.3)
Chloride: 81 mmol/L — ABNORMAL LOW (ref 98–111)
GFR calc Af Amer: >60 mL/min (ref >60)
Glucose, Bld: 118 mg/dL — ABNORMAL HIGH (ref 70–99)
POTASSIUM: 3.9 mmol/L (ref 3.5–5.1)
Sodium: 124 mmol/L — ABNORMAL LOW (ref 135–145)
TOTAL PROTEIN: 8.2 g/dL — AB (ref 6.5–8.1)

## 2018-05-20 LAB — GLUCOSE, CAPILLARY: GLUCOSE-CAPILLARY: 99 mg/dL (ref 70–99)

## 2018-05-20 LAB — CK: CK TOTAL: 18367 U/L — AB (ref 49–397)

## 2018-05-20 LAB — CBC WITH DIFFERENTIAL/PLATELET
Abs Immature Granulocytes: 0.03 10*3/uL (ref 0.00–0.07)
BASOS ABS: 0 10*3/uL (ref 0.0–0.1)
Basophils Relative: 0 %
EOS ABS: 0 10*3/uL (ref 0.0–0.5)
EOS PCT: 0 %
HEMATOCRIT: 42.6 % (ref 39.0–52.0)
Hemoglobin: 14.3 g/dL (ref 13.0–17.0)
Immature Granulocytes: 1 %
LYMPHS ABS: 0.8 10*3/uL (ref 0.7–4.0)
LYMPHS PCT: 19 %
MCH: 29.5 pg (ref 26.0–34.0)
MCHC: 33.6 g/dL (ref 30.0–36.0)
MCV: 87.8 fL (ref 80.0–100.0)
Monocytes Absolute: 0.9 10*3/uL (ref 0.1–1.0)
Monocytes Relative: 21 %
NRBC: 0.5 % — AB (ref 0.0–0.2)
Neutro Abs: 2.5 10*3/uL (ref 1.7–7.7)
Neutrophils Relative %: 59 %
Platelets: UNDETERMINED 10*3/uL (ref 150–400)
RBC: 4.85 MIL/uL (ref 4.22–5.81)
RDW: 14 % (ref 11.5–15.5)
WBC: 4.3 10*3/uL (ref 4.0–10.5)

## 2018-05-20 LAB — MRSA PCR SCREENING: MRSA by PCR: NEGATIVE

## 2018-05-20 LAB — AMMONIA: Ammonia: 26 umol/L (ref 9–35)

## 2018-05-20 LAB — ACETAMINOPHEN LEVEL: Acetaminophen (Tylenol), Serum: <10 ug/mL — ABNORMAL LOW (ref 10–30)

## 2018-05-20 LAB — TROPONIN I
Troponin I: 0.33 ng/mL (ref <0.03)
Troponin I: 0.3 ng/mL (ref <0.03)

## 2018-05-20 LAB — RAPID URINE DRUG SCREEN, HOSP PERFORMED
AMPHETAMINES: NOT DETECTED
BARBITURATES: NOT DETECTED
Benzodiazepines: NOT DETECTED
COCAINE: NOT DETECTED
Opiates: NOT DETECTED
Tetrahydrocannabinol: NOT DETECTED

## 2018-05-20 LAB — PROTIME-INR
INR: 0.93
PROTHROMBIN TIME: 12.4 s (ref 11.4–15.2)

## 2018-05-20 LAB — ETHANOL

## 2018-05-20 LAB — CBG MONITORING, ED: GLUCOSE-CAPILLARY: 70 mg/dL (ref 70–99)

## 2018-05-20 LAB — VITAMIN B12: Vitamin B-12: 349 pg/mL (ref 180–914)

## 2018-05-20 LAB — SALICYLATE LEVEL: Salicylate Lvl: <7 mg/dL (ref 2.8–30.0)

## 2018-05-20 LAB — INFLUENZA PANEL BY PCR (TYPE A & B)
Influenza A By PCR: POSITIVE — AB
Influenza B By PCR: NEGATIVE

## 2018-05-20 MED ORDER — FOLIC ACID 1 MG PO TABS
1.0000 mg | ORAL_TABLET | Freq: Every day | ORAL | Status: DC
  Administered 2018-05-21 – 2018-05-28 (×8): 1 mg via ORAL
  Filled 2018-05-20 (×8): qty 1

## 2018-05-20 MED ORDER — HEPARIN SODIUM (PORCINE) 5000 UNIT/ML IJ SOLN: 5000.0000 [IU] | Freq: Three times a day (TID) | INTRAMUSCULAR | Status: DC

## 2018-05-20 MED ORDER — LORAZEPAM 1 MG PO TABS: 0.0000 mg | ORAL_TABLET | Freq: Two times a day (BID) | ORAL | Status: AC

## 2018-05-20 MED ORDER — ACETAMINOPHEN 325 MG PO TABS
650.0000 mg | ORAL_TABLET | Freq: Four times a day (QID) | ORAL | Status: DC | PRN
  Administered 2018-05-20 – 2018-05-21 (×3): 650 mg via ORAL
  Filled 2018-05-20 (×4): qty 2

## 2018-05-20 MED ORDER — ONDANSETRON HCL 4 MG/2ML IJ SOLN: 4.0000 mg | Freq: Four times a day (QID) | INTRAMUSCULAR | Status: DC | PRN

## 2018-05-20 MED ORDER — SENNOSIDES-DOCUSATE SODIUM 8.6-50 MG PO TABS: 1.0000 | ORAL_TABLET | Freq: Every evening | ORAL | Status: DC | PRN

## 2018-05-20 MED ORDER — LORAZEPAM 1 MG PO TABS: 0.0000 mg | ORAL_TABLET | Freq: Four times a day (QID) | ORAL | Status: AC

## 2018-05-20 MED ORDER — ENOXAPARIN SODIUM 40 MG/0.4ML ~~LOC~~ SOLN
40.0000 mg | SUBCUTANEOUS | Status: DC
  Administered 2018-05-21 – 2018-05-27 (×7): 40 mg via SUBCUTANEOUS
  Filled 2018-05-20 (×8): qty 0.4

## 2018-05-20 MED ORDER — SODIUM CHLORIDE 0.9 % IV BOLUS
1000.0000 mL | Freq: Once | INTRAVENOUS | Status: AC
  Administered 2018-05-20: 1000 mL via INTRAVENOUS

## 2018-05-20 MED ORDER — LACTATED RINGERS IV BOLUS: 1000.0000 mL | Freq: Once | INTRAVENOUS | Status: DC

## 2018-05-20 MED ORDER — LORAZEPAM 1 MG PO TABS: 1.0000 mg | ORAL_TABLET | Freq: Four times a day (QID) | ORAL | Status: AC | PRN

## 2018-05-20 MED ORDER — LORAZEPAM 2 MG/ML IJ SOLN
0.0000 mg | Freq: Four times a day (QID) | INTRAMUSCULAR | Status: AC
  Administered 2018-05-21: 1 mg via INTRAVENOUS
  Filled 2018-05-20: qty 1

## 2018-05-20 MED ORDER — VITAMIN B-1 100 MG PO TABS
100.0000 mg | ORAL_TABLET | Freq: Every day | ORAL | Status: DC
  Administered 2018-05-21 – 2018-05-28 (×8): 100 mg via ORAL
  Filled 2018-05-20 (×8): qty 1

## 2018-05-20 MED ORDER — THIAMINE HCL 100 MG/ML IJ SOLN
100.0000 mg | Freq: Once | INTRAMUSCULAR | Status: AC
  Administered 2018-05-20: 100 mg via INTRAVENOUS
  Filled 2018-05-20: qty 2

## 2018-05-20 MED ORDER — INSULIN ASPART 100 UNIT/ML ~~LOC~~ SOLN
0.0000 [IU] | Freq: Three times a day (TID) | SUBCUTANEOUS | Status: DC
  Administered 2018-05-22 (×2): 2 [IU] via SUBCUTANEOUS
  Administered 2018-05-23: 11 [IU] via SUBCUTANEOUS
  Administered 2018-05-23: 3 [IU] via SUBCUTANEOUS
  Administered 2018-05-24 (×3): 2 [IU] via SUBCUTANEOUS

## 2018-05-20 MED ORDER — LORAZEPAM 2 MG/ML IJ SOLN: 0.0000 mg | Freq: Two times a day (BID) | INTRAMUSCULAR | Status: AC

## 2018-05-20 MED ORDER — LORAZEPAM 2 MG/ML IJ SOLN
1.0000 mg | Freq: Four times a day (QID) | INTRAMUSCULAR | Status: AC | PRN
  Administered 2018-05-21: 1 mg via INTRAVENOUS
  Filled 2018-05-20: qty 1

## 2018-05-20 MED ORDER — BISACODYL 5 MG PO TBEC: 5.0000 mg | DELAYED_RELEASE_TABLET | Freq: Every day | ORAL | Status: DC | PRN

## 2018-05-20 MED ORDER — ACETAMINOPHEN 650 MG RE SUPP: 650.0000 mg | Freq: Four times a day (QID) | RECTAL | Status: DC | PRN

## 2018-05-20 MED ORDER — ONDANSETRON HCL 4 MG PO TABS: 4.0000 mg | ORAL_TABLET | Freq: Four times a day (QID) | ORAL | Status: DC | PRN

## 2018-05-20 MED ORDER — ADULT MULTIVITAMIN W/MINERALS CH
1.0000 | ORAL_TABLET | Freq: Every day | ORAL | Status: DC
  Administered 2018-05-21 – 2018-05-28 (×8): 1 via ORAL
  Filled 2018-05-20 (×8): qty 1

## 2018-05-20 MED ORDER — SODIUM CHLORIDE 0.9 % IV SOLN
INTRAVENOUS | Status: AC
  Administered 2018-05-21 – 2018-05-23 (×7): via INTRAVENOUS

## 2018-05-20 MED ORDER — SODIUM CHLORIDE 0.9 % IV SOLN
INTRAVENOUS | Status: DC
Start: 2018-05-20 — End: 2018-05-21

## 2018-05-20 MED ORDER — THIAMINE HCL 100 MG/ML IJ SOLN: 100.0000 mg | Freq: Every day | INTRAMUSCULAR | Status: DC

## 2018-05-20 MED ORDER — OSELTAMIVIR PHOSPHATE 75 MG PO CAPS
75.0000 mg | ORAL_CAPSULE | Freq: Two times a day (BID) | ORAL | Status: AC
  Administered 2018-05-21 – 2018-05-25 (×10): 75 mg via ORAL
  Filled 2018-05-20 (×11): qty 1

## 2018-05-20 NOTE — Progress Notes (Signed)
Pt arrived from ED. Alert to person, place, and time. When primary RN, Sophia assessed feet, Socks were adhered to his toes. Once socks removed, toes are grey and cold.  [place] and [place] paged to assess/advise.  Earnest Conroy. Clelia Croft, RN
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
## 2018-05-20 NOTE — Progress Notes (Signed)
CRITICAL VALUE ALERT  Critical Value:  Troponin 0.30  Date & Time Notied:  05/20/2018 20:17  Provider Notified:  Dr. Bruna Potter  Orders Received/Actions taken: awaiting any new orders.

## 2018-05-20 NOTE — ED Notes (Signed)
Date and time results received: 05/20/18 1508 (use smartphrase ".now" to insert current time)  Test: Lactic Acid  Critical Value: 3.1 Name of Provider Notified: Kathlene NovemberMike, RN  Orders Received? Or Actions Taken?: Actions Taken: Surveyor, miningotified Primary RN

## 2018-05-20 NOTE — ED Provider Notes (Signed)
Patient care assumed at 1600.  Pt with hx/o EtOH abuse here after [place].  Pt with dehydration, hyponatremia, rhabdo, troponemia.  Plan to admit for further evaluation and mgmt.     Tilden Fossa, MD 05/21/18 0001

## 2018-05-20 NOTE — ED Notes (Signed)
Bed: EX93 Expected date:  Expected time:  Means of arrival:  Comments: 53 yo FTT

## 2018-05-20 NOTE — ED Provider Notes (Signed)
[facility]-EMERGENCY DEPT Provider Note   CSN: [id number] Arrival date & time: 05/20/18  1347     History   Chief Complaint Chief Complaint  Patient presents with  . Alcohol Intoxication  . Failure To Thrive  . Fatigue    HPI Jake Nguyen is a 53 y.o. male.  The history is provided by the EMS personnel. The history is limited by the condition of the patient (intoxicated).  Alcohol Intoxication   Pt was seen at 1350. Per EMS: EMS called by pt's brother who called for pt homeless, laying on ground at bus stop since 7am today. Hx etoh abuse. EMS states CBG 60 on scene, gave glucose with CBG increasing to 74. Pt "unable to sit up" per EMS, and "admits to drinking beer today."   Past Medical History:  Diagnosis Date  . Alcoholism (HCC)     There are no active problems to display for this patient.   History reviewed. No pertinent surgical history.      Home Medications    Prior to Admission medications   Not on File    Family History History reviewed. No pertinent family history.  Social History Social History   Tobacco Use  . Smoking status: Current Every Day Smoker    Packs/day: 0.50    Types: Cigarettes  . Smokeless tobacco: Never Used  Substance Use Topics  . Alcohol use: Yes  . Drug use: Not on file     Allergies   Pork-derived products   Review of Systems Review of Systems  Unable to perform ROS: Other     Physical Exam Updated Vital Signs BP (!) 115/99   Pulse (!) 107   Temp 98.1 F (36.7 C) (Oral)   Resp 16   SpO2 100%    BP 124/90   Pulse (!) 113   Temp 98.1 F (36.7 C) (Oral)   Resp 18   SpO2 98%    Physical Exam 1355: Physical examination:  Nursing notes reviewed; Vital signs and O2 SAT reviewed;  Constitutional: Well developed, Well nourished, In no acute distress. Poor hygiene, disheveled.; Head:  Normocephalic, atraumatic; Eyes: EOMI, PERRL, No scleral icterus; ENMT: Mouth and pharynx normal,  Mucous membranes dry; Neck: Supple, Full range of motion, No lymphadenopathy; Cardiovascular: Regular rate and rhythm, No gallop; Respiratory: Breath sounds clear & equal bilaterally, No wheezes. +moist cough. Normal respiratory effort/excursion; Chest: Nontender, Movement normal; Abdomen: Soft, Nontender, Nondistended, Normal bowel sounds; Genitourinary: No CVA tenderness; Spine:  No midline CS, TS, LS tenderness.;; Extremities: Peripheral pulses normal, No tenderness, No edema, No calf edema or asymmetry.; Neuro: With me: Pt laying with eyes closed, moaning. Will open eyes spontaneously, look around, then close them again and moan. ED RN and EMS states pt spoke with them. No facial droop. Moves all extremities spontaneously on stretcher..; Skin: Color normal, Warm, Dry.      ED Treatments / Results  Labs (all labs ordered are listed, but only abnormal results are displayed)   EKG None  Radiology   Procedures Procedures (including critical care time)  Medications Ordered in ED Medications  sodium chloride 0.9 % bolus 1,000 mL (1,000 mLs Intravenous New Bag/Given 05/20/18 1532)  0.9 %  sodium chloride infusion (has no administration in time range)  sodium chloride 0.9 % bolus 1,000 mL (1,000 mLs Intravenous New Bag/Given 05/20/18 1532)  LORazepam (ATIVAN) injection 0-4 mg (has no administration in time range)    Or  LORazepam (ATIVAN) tablet 0-4 mg (has no  administration in time range)  LORazepam (ATIVAN) injection 0-4 mg (has no administration in time range)    Or  LORazepam (ATIVAN) tablet 0-4 mg (has no administration in time range)  thiamine (B-1) injection 100 mg (100 mg Intravenous Given 05/20/18 1532)     Initial Impression / Assessment and Plan / ED Course  I have reviewed the triage vital signs and the nursing notes.  Pertinent labs & imaging results that were available during my care of the patient were reviewed by me and considered in my medical decision making (see chart  for details).  MDM Reviewed: previous chart, nursing note and vitals Reviewed previous: labs and ECG Interpretation: labs, ECG, x-ray and CT scan Total time providing critical care: 30-74 minutes. This excludes time spent performing separately reportable procedures and services.    CRITICAL CARE Performed by: Samuel Jester Total critical care time: 35 minutes Critical care time was exclusive of separately billable procedures and treating other patients. Critical care was necessary to treat or prevent imminent or life-threatening deterioration. Critical care was time spent personally by me on the following activities: development of treatment plan with patient and/or surrogate as well as nursing, discussions with consultants, evaluation of patient's response to treatment, examination of patient, obtaining history from patient or surrogate, ordering and performing treatments and interventions, ordering and review of laboratory studies, ordering and review of radiographic studies, pulse oximetry and re-evaluation of patient's condition.   ED ECG REPORT   Date: 05/20/2018  Rate: 95  Rhythm: normal sinus rhythm  QRS Axis: normal  Intervals: normal  ST/T Wave abnormalities: nonspecific ST/T changes  Conduction Disutrbances:none  Narrative Interpretation: Artifact  Old EKG Reviewed: none available I have personally reviewed the EKG tracing and agree with the computerized printout as noted.   Results for orders placed or performed during the hospital encounter of 05/20/18  Acetaminophen level  Result Value Ref Range   Acetaminophen (Tylenol), Serum <10 (L) 10 - 30 ug/mL  Comprehensive metabolic panel  Result Value Ref Range   Sodium 124 (L) 135 - 145 mmol/L   Potassium 3.9 3.5 - 5.1 mmol/L   Chloride 81 (L) 98 - 111 mmol/L   CO2 24 22 - 32 mmol/L   Glucose, Bld 118 (H) 70 - 99 mg/dL   BUN 24 (H) 6 - 20 mg/dL   Creatinine, Ser 5.83 0.61 - 1.24 mg/dL   Calcium 8.1 (L) 8.9 - 10.3  mg/dL   Total Protein 8.2 (H) 6.5 - 8.1 g/dL   Albumin 3.4 (L) 3.5 - 5.0 g/dL   AST 094 (H) 15 - 41 U/L   ALT 128 (H) 0 - 44 U/L   Alkaline Phosphatase 46 38 - 126 U/L   Total Bilirubin 1.5 (H) 0.3 - 1.2 mg/dL   GFR calc non Af Amer >60 >60 mL/min   GFR calc Af Amer >60 >60 mL/min   Anion gap 19 (H) 5 - 15  Ethanol  Result Value Ref Range   Alcohol, Ethyl (B) <10 <10 mg/dL  Salicylate level  Result Value Ref Range   Salicylate Lvl <7.0 2.8 - 30.0 mg/dL  Troponin I - Once  Result Value Ref Range   Troponin I 0.33 (HH) <0.03 ng/mL  Lactic acid, plasma  Result Value Ref Range   Lactic Acid, Venous 3.1 (HH) 0.5 - 1.9 mmol/L  CBC with Differential  Result Value Ref Range   WBC 4.3 4.0 - 10.5 K/uL   RBC 4.85 4.22 - 5.81 MIL/uL   Hemoglobin  14.3 13.0 - 17.0 g/dL   HCT 40.942.6 81.139.0 - 91.452.0 %   MCV 87.8 80.0 - 100.0 fL   MCH 29.5 26.0 - 34.0 pg   MCHC 33.6 30.0 - 36.0 g/dL   RDW 78.214.0 95.611.5 - 21.315.5 %   Platelets PLATELET CLUMPS NOTED ON SMEAR, UNABLE TO ESTIMATE 150 - 400 K/uL   nRBC 0.5 (H) 0.0 - 0.2 %   Neutrophils Relative % 59 %   Neutro Abs 2.5 1.7 - 7.7 K/uL   Lymphocytes Relative 19 %   Lymphs Abs 0.8 0.7 - 4.0 K/uL   Monocytes Relative 21 %   Monocytes Absolute 0.9 0.1 - 1.0 K/uL   Eosinophils Relative 0 %   Eosinophils Absolute 0.0 0.0 - 0.5 K/uL   Basophils Relative 0 %   Basophils Absolute 0.0 0.0 - 0.1 K/uL   Immature Granulocytes 1 %   Abs Immature Granulocytes 0.03 0.00 - 0.07 K/uL   Acanthocytes PRESENT   Protime-INR  Result Value Ref Range   Prothrombin Time 12.4 11.4 - 15.2 seconds   INR 0.93   Urine rapid drug screen (hosp performed)  Result Value Ref Range   Opiates NONE DETECTED NONE DETECTED   Cocaine NONE DETECTED NONE DETECTED   Benzodiazepines NONE DETECTED NONE DETECTED   Amphetamines NONE DETECTED NONE DETECTED   Tetrahydrocannabinol NONE DETECTED NONE DETECTED   Barbiturates NONE DETECTED NONE DETECTED  Urinalysis, Routine w reflex microscopic    Result Value Ref Range   Color, Urine YELLOW YELLOW   APPearance CLEAR CLEAR   Specific Gravity, Urine 1.024 1.005 - 1.030   pH 6.0 5.0 - 8.0   Glucose, UA NEGATIVE NEGATIVE mg/dL   Hgb urine dipstick LARGE (A) NEGATIVE   Bilirubin Urine NEGATIVE NEGATIVE   Ketones, ur 80 (A) NEGATIVE mg/dL   Protein, ur >=086>=300 (A) NEGATIVE mg/dL   Nitrite NEGATIVE NEGATIVE   Leukocytes, UA NEGATIVE NEGATIVE   RBC / HPF 0-5 0 - 5 RBC/hpf   WBC, UA 0-5 0 - 5 WBC/hpf   Bacteria, UA NONE SEEN NONE SEEN   Mucus PRESENT    Hyaline Casts, UA PRESENT    Granular Casts, UA PRESENT    Non Squamous Epithelial 0-5 (A) NONE SEEN  CBG monitoring, ED  Result Value Ref Range   Glucose-Capillary 70 70 - 99 mg/dL     Results for Rosealee AlbeeJACKSON, La L (MRN [id number]) as of 05/20/2018 15:13  Ref. Range 10/27/2017 16:59 05/20/2018 13:57  AST Latest Ref Range: 15 - 41 U/L 100 (H) 704 (H)  ALT Latest Ref Range: 0 - 44 U/L 52 (H) 128 (H)     1545:  IVF boluses ordered, as well as CIWA protocol, and thiamine. Pt will answer questions with me now. Talking with his brother at bedside. Speech clear.   1625:  CT-H pending, as well as ammonia, flu and CK level. Pt will need admission. Sign out Dr. Madilyn Hookees.      Final Clinical Impressions(s) / ED Diagnoses   Final diagnoses:  None    ED Discharge Orders    None       Samuel JesterMcManus, Hridaan Bouse, DO 05/23/18 1851

## 2018-05-20 NOTE — ED Notes (Signed)
ED TO INPATIENT HANDOFF REPORT  Name/Age/Gender Jake Nguyen 53 y.o. male  Code Status    Code Status Orders  (From admission, onward)         Start     Ordered   05/20/18 1707  Full code  Continuous     05/20/18 1709        Code Status History    Date Active Date Inactive Code Status Order ID Comments User Context   02/02/2016 1702 02/02/2016 2138 Full Code [id number]  Trixie Dredge, PA-C ED      Home/SNF/Other Homeless  Chief Complaint FTT  Level of Care/Admitting Diagnosis ED Disposition    ED Disposition Condition Comment   Admit  Hospital Area: [facility] [[id number]]  Level of Care: Telemetry [5]  Admit to tele based on following criteria: Complex arrhythmia (Bradycardia/Tachycardia)  Diagnosis: Rhabdomyolysis [728.88.ICD-9-CM]  Admitting Physician: Stephania Fragmin [facility] [[id number]]  Attending Physician: Stephania Fragmin [facility] [[id number]]  Estimated length of stay: past midnight tomorrow  Certification:: I certify this patient will need inpatient services for at least 2 midnights  PT Class (Do Not Modify): Inpatient [101]  PT Acc Code (Do Not Modify): Private [1]       Medical History Past Medical History:  Diagnosis Date  . Alcoholism (HCC)     Allergies Allergies  Allergen Reactions  . Pork-Derived Products Other (See Comments)    Pt states he doesn't eat pork    IV Location/Drains/Wounds Patient Lines/Drains/Airways Status   Active Line/Drains/Airways    Name:   Placement date:   Placement time:   Site:   Days:   Peripheral IV 05/20/18 Right;Anterior Forearm   05/20/18    1518    Forearm   less than 1          Labs/Imaging Results for orders placed or performed during the hospital encounter of 05/20/18 (from the past 48 hour(s))  Acetaminophen level     Status: Abnormal   Collection Time: 05/20/18  1:57 PM  Result Value Ref Range   Acetaminophen (Tylenol), Serum <10 (L) 10 - 30 ug/mL    Comment: (NOTE) Therapeutic  concentrations vary significantly. A range of 10-30 ug/mL  may be an effective concentration for many patients. However, some  are best treated at concentrations outside of this range. Acetaminophen concentrations >150 ug/mL at 4 hours after ingestion  and >50 ug/mL at 12 hours after ingestion are often associated with  toxic reactions. Performed at [facility], [address] [place], [place] [address]   Comprehensive metabolic panel     Status: Abnormal   Collection Time: 05/20/18  1:57 PM  Result Value Ref Range   Sodium 124 (L) 135 - 145 mmol/L   Potassium 3.9 3.5 - 5.1 mmol/L   Chloride 81 (L) 98 - 111 mmol/L   CO2 24 22 - 32 mmol/L   Glucose, Bld 118 (H) 70 - 99 mg/dL   BUN 24 (H) 6 - 20 mg/dL   Creatinine, Ser 6.94 0.61 - 1.24 mg/dL   Calcium 8.1 (L) 8.9 - 10.3 mg/dL   Total Protein 8.2 (H) 6.5 - 8.1 g/dL   Albumin 3.4 (L) 3.5 - 5.0 g/dL   AST 503 (H) 15 - 41 U/L   ALT 128 (H) 0 - 44 U/L   Alkaline Phosphatase 46 38 - 126 U/L   Total Bilirubin 1.5 (H) 0.3 - 1.2 mg/dL   GFR calc non Af Amer >60 >60 mL/min   GFR calc Af  Amer >60 >60 mL/min   Anion gap 19 (H) 5 - 15    Comment: Performed at [facility], [address] [place], [place] [address]  Ethanol     Status: None   Collection Time: 05/20/18  1:57 PM  Result Value Ref Range   Alcohol, Ethyl (B) <10 <10 mg/dL    Comment: (NOTE) Lowest detectable limit for serum alcohol is 10 mg/dL. For medical purposes only. Performed at [facility], [address] [place], [place] [address]   Salicylate level     Status: None   Collection Time: 05/20/18  1:57 PM  Result Value Ref Range   Salicylate Lvl <7.0 2.8 - 30.0 mg/dL    Comment: Performed at [facility], [address] [place], [place] [address]  Troponin I - Once     Status: Abnormal   Collection Time: 05/20/18  1:57 PM  Result Value Ref Range   Troponin I 0.33 (HH) <0.03 ng/mL     Comment: CRITICAL RESULT CALLED TO, READ BACK BY AND VERIFIED WITH: S CLAPP,RN 05/20/18 1509 RHOLMES Performed at [facility], [address] [place], [place] [address]   Lactic acid, plasma     Status: Abnormal   Collection Time: 05/20/18  1:57 PM  Result Value Ref Range   Lactic Acid, Venous 3.1 (HH) 0.5 - 1.9 mmol/L    Comment: CRITICAL RESULT CALLED TO, READ BACK BY AND VERIFIED WITH: S CLAPP,RN 05/20/18 1507 RHOLMES Performed at [facility], [address] [place], [place] [address]   CBC with Differential     Status: Abnormal   Collection Time: 05/20/18  1:57 PM  Result Value Ref Range   WBC 4.3 4.0 - 10.5 K/uL   RBC 4.85 4.22 - 5.81 MIL/uL   Hemoglobin 14.3 13.0 - 17.0 g/dL   HCT 69.642.6 29.539.0 - 28.452.0 %   MCV 87.8 80.0 - 100.0 fL   MCH 29.5 26.0 - 34.0 pg   MCHC 33.6 30.0 - 36.0 g/dL   RDW 13.214.0 44.011.5 - 10.215.5 %   Platelets PLATELET CLUMPS NOTED ON SMEAR, UNABLE TO ESTIMATE 150 - 400 K/uL    Comment: Immature Platelet Fraction may be clinically indicated, consider ordering this additional test [id number]    nRBC 0.5 (H) 0.0 - 0.2 %   Neutrophils Relative % 59 %   Neutro Abs 2.5 1.7 - 7.7 K/uL   Lymphocytes Relative 19 %   Lymphs Abs 0.8 0.7 - 4.0 K/uL   Monocytes Relative 21 %   Monocytes Absolute 0.9 0.1 - 1.0 K/uL   Eosinophils Relative 0 %   Eosinophils Absolute 0.0 0.0 - 0.5 K/uL   Basophils Relative 0 %   Basophils Absolute 0.0 0.0 - 0.1 K/uL   Immature Granulocytes 1 %   Abs Immature Granulocytes 0.03 0.00 - 0.07 K/uL   Acanthocytes PRESENT     Comment: Performed at [facility], [address] [place], [place] [address]  Protime-INR     Status: None   Collection Time: 05/20/18  1:57 PM  Result Value Ref Range   Prothrombin Time 12.4 11.4 - 15.2 seconds   INR 0.93     Comment: Performed at [facility], [address] [place], [place] [address]  CK     Status: Abnormal   Collection Time:  05/20/18  1:57 PM  Result Value Ref Range   Total CK 18,367 (H) 49 - 397 U/L  Comment: RESULTS CONFIRMED BY MANUAL DILUTION Performed at [facility], [address] [place], [place] [address]   CBG monitoring, ED     Status: None   Collection Time: 05/20/18  2:15 PM  Result Value Ref Range   Glucose-Capillary 70 70 - 99 mg/dL  Urine rapid drug screen (hosp performed)     Status: None   Collection Time: 05/20/18  2:58 PM  Result Value Ref Range   Opiates NONE DETECTED NONE DETECTED   Cocaine NONE DETECTED NONE DETECTED   Benzodiazepines NONE DETECTED NONE DETECTED   Amphetamines NONE DETECTED NONE DETECTED   Tetrahydrocannabinol NONE DETECTED NONE DETECTED   Barbiturates NONE DETECTED NONE DETECTED    Comment: (NOTE) DRUG SCREEN FOR MEDICAL PURPOSES ONLY.  IF CONFIRMATION IS NEEDED FOR ANY PURPOSE, NOTIFY LAB WITHIN 5 DAYS. LOWEST DETECTABLE LIMITS FOR URINE DRUG SCREEN Drug Class                     Cutoff (ng/mL) Amphetamine and metabolites    1000 Barbiturate and metabolites    200 Benzodiazepine                 200 Tricyclics and metabolites     300 Opiates and metabolites        300 Cocaine and metabolites        300 THC                            50 Performed at [facility], [address], [place], [place] [address]   Urinalysis, Routine w reflex microscopic     Status: Abnormal   Collection Time: 05/20/18  2:58 PM  Result Value Ref Range   Color, Urine YELLOW YELLOW   APPearance CLEAR CLEAR   Specific Gravity, Urine 1.024 1.005 - 1.030   pH 6.0 5.0 - 8.0   Glucose, UA NEGATIVE NEGATIVE mg/dL   Hgb urine dipstick LARGE (A) NEGATIVE   Bilirubin Urine NEGATIVE NEGATIVE   Ketones, ur 80 (A) NEGATIVE mg/dL   Protein, ur >=578 (A) NEGATIVE mg/dL   Nitrite NEGATIVE NEGATIVE   Leukocytes, UA NEGATIVE NEGATIVE   RBC / HPF 0-5 0 - 5 RBC/hpf   WBC, UA 0-5 0 - 5 WBC/hpf   Bacteria, UA NONE SEEN NONE SEEN   Mucus PRESENT     Hyaline Casts, UA PRESENT    Granular Casts, UA PRESENT    Non Squamous Epithelial 0-5 (A) NONE SEEN    Comment: Performed at [facility], [address], [place], [place] [address]  Influenza panel by PCR (type A & B)     Status: Abnormal   Collection Time: 05/20/18  3:54 PM  Result Value Ref Range   Influenza A By PCR POSITIVE (A) NEGATIVE   Influenza B By PCR NEGATIVE NEGATIVE    Comment: (NOTE) The Xpert Xpress Flu assay is intended as an aid in the diagnosis of  influenza and should not be used as a sole basis for treatment.  This  assay is FDA approved for nasopharyngeal swab specimens only. Nasal  washings and aspirates are unacceptable for Xpert Xpress Flu testing. Performed at [facility], [address], [place], [place] [address]   Lactic acid, plasma     Status: None   Collection Time: 05/20/18  3:57 PM  Result Value Ref Range   Lactic Acid, Venous 1.9 0.5 - 1.9 mmol/L  Comment: Performed at [facility], [address] [place], [place] [address]  Ammonia     Status: None   Collection Time: 05/20/18  4:27 PM  Result Value Ref Range   Ammonia 26 9 - 35 umol/L    Comment: Performed at [facility], [address] [place], [place] [address]   Dg Chest 1 View  Result Date: 05/20/2018 CLINICAL DATA:  Patient with failure to thrive. EXAM: CHEST  1 VIEW COMPARISON:  None. FINDINGS: Monitoring leads overlie the patient. Normal cardiac and mediastinal contours. No consolidative pulmonary opacities. No pleural effusion or pneumothorax. IMPRESSION: No acute cardiopulmonary process. Electronically Signed   By: Annia Belt M.D.   On: 05/20/2018 15:53   Ct Head Wo Contrast  Result Date: 05/20/2018 CLINICAL DATA:  Altered mental status, ETOH abuse, failure to thrive EXAM: CT HEAD WITHOUT CONTRAST TECHNIQUE: Contiguous axial images were obtained from the base of the skull through the vertex without  intravenous contrast. COMPARISON:  10/27/2017 FINDINGS: Brain: No evidence of acute infarction, hemorrhage, hydrocephalus, extra-axial collection or mass lesion/mass effect. Cortical volume is likely decreased for age. Vascular: No hyperdense vessel or unexpected calcification. Skull: Normal. Negative for fracture or focal lesion. Sinuses/Orbits: Mild paranasal sinus opacification, maxillary and ethmoid predominant. Mastoid air cells are clear. Other: None. IMPRESSION: Normal head CT. Electronically Signed   By: Charline Bills M.D.   On: 05/20/2018 16:45   None  Pending Labs Unresulted Labs (From admission, onward)    Start     Ordered   05/21/18 0500  Comprehensive metabolic panel  Daily,   R    Question:  Specimen collection method  Answer:  Lab=Lab collect   05/20/18 1709   05/21/18 0500  CBC  Daily,   R    Question:  Specimen collection method  Answer:  Lab=Lab collect   05/20/18 1709   05/21/18 0500  Magnesium  Daily,   R    Question:  Specimen collection method  Answer:  Lab=Lab collect   05/20/18 1709   05/21/18 0500  CK  Daily,   R    Question:  Specimen collection method  Answer:  Lab=Lab collect   05/20/18 1709   05/20/18 1800  Troponin I - Now Then Q6H  Now then every 6 hours,   R    Question:  Specimen collection method  Answer:  Lab=Lab collect   05/20/18 1709   05/20/18 1748  Culture, blood (Routine X 2) w Reflex to ID Panel  BLOOD CULTURE X 2,   R     05/20/18 1747   05/20/18 1709  Vitamin B12  Once,   R    Question:  Specimen collection method  Answer:  Lab=Lab collect   05/20/18 1709   05/20/18 1709  Folate RBC  Once,   R    Question:  Specimen collection method  Answer:  Lab=Lab collect   05/20/18 1709   05/20/18 1706  HIV antibody (Routine Testing)  Once,   R    Question:  Specimen collection method  Answer:  Lab=Lab collect   05/20/18 1709   05/20/18 1706  CBC  (heparin)  Once,   R    Comments:  Baseline for heparin therapy IF NOT ALREADY DRAWN.  Notify MD if  PLT < 100 K.   Question:  Specimen collection method  Answer:  Lab=Lab collect   05/20/18 1709   05/20/18 1706  Creatinine, serum  (heparin)  Once,   R  Comments:  Baseline for heparin therapy IF NOT ALREADY DRAWN.   Question:  Specimen collection method  Answer:  Lab=Lab collect   05/20/18 1709   05/20/18 1357  Urine culture  ONCE - STAT,   STAT     05/20/18 1357          Vitals/Pain Today's Vitals   05/20/18 1630 05/20/18 1700 05/20/18 1743 05/20/18 1800  BP: (!) 137/98 (!) 125/95  122/77  Pulse: (!) 112 (!) 113  97  Resp: (!) 27 (!) 26  (!) 24  Temp:   (!) 101.7 F (38.7 C)   TempSrc:   Rectal   SpO2: 100% 95%  100%  PainSc:        Isolation Precautions Droplet precaution  Medications Medications  0.9 %  sodium chloride infusion (has no administration in time range)  LORazepam (ATIVAN) injection 0-4 mg (0 mg Intravenous Not Given 05/20/18 1643)    Or  LORazepam (ATIVAN) tablet 0-4 mg ( Oral See Alternative 05/20/18 1643)  LORazepam (ATIVAN) injection 0-4 mg (has no administration in time range)    Or  LORazepam (ATIVAN) tablet 0-4 mg (has no administration in time range)  0.9 %  sodium chloride infusion (has no administration in time range)  acetaminophen (TYLENOL) tablet 650 mg (has no administration in time range)    Or  acetaminophen (TYLENOL) suppository 650 mg (has no administration in time range)  senna-docusate (Senokot-S) tablet 1 tablet (has no administration in time range)  bisacodyl (DULCOLAX) EC tablet 5 mg (has no administration in time range)  ondansetron (ZOFRAN) tablet 4 mg (has no administration in time range)    Or  ondansetron (ZOFRAN) injection 4 mg (has no administration in time range)  insulin aspart (novoLOG) injection 0-15 Units (has no administration in time range)  enoxaparin (LOVENOX) injection 40 mg (has no administration in time range)  LORazepam (ATIVAN) tablet 1 mg (has no administration in time range)    Or  LORazepam (ATIVAN)  injection 1 mg (has no administration in time range)  thiamine (VITAMIN B-1) tablet 100 mg (has no administration in time range)    Or  thiamine (B-1) injection 100 mg (has no administration in time range)  folic acid (FOLVITE) tablet 1 mg (has no administration in time range)  multivitamin with minerals tablet 1 tablet (has no administration in time range)  oseltamivir (TAMIFLU) capsule 75 mg (has no administration in time range)  sodium chloride 0.9 % bolus 1,000 mL (0 mLs Intravenous Stopped 05/20/18 1641)  thiamine (B-1) injection 100 mg (100 mg Intravenous Given 05/20/18 1532)  sodium chloride 0.9 % bolus 1,000 mL (0 mLs Intravenous Stopped 05/20/18 1641)    Mobility walks

## 2018-05-20 NOTE — ED Triage Notes (Signed)
EMS reports homeless from bus stop, with brother who called for failure to thrive, Pt has been laying on ground at bus stop since approx. 7 am. Pt unable to sit up on own at EMS arrival. Hx of ETOH abuse Pt admits to drinking beer today.  BP 144/94 HR 110 RR 16  CBG 60 on scene given 15gms oral glucose CBG enroute 74

## 2018-05-20 NOTE — Progress Notes (Addendum)
Received pt from the ED, slow to respond, during transfer pt became alert and able to state name and location. "I getting ready to leave." Pt reoriented and skin assessed with Herbert Seta, RN. Pt has dry flaky skin over 100% of his body, skin appear dry flaky, leathery skin. Pt sock were mated dry to his feet, removed by cutting the sock. Pt feet are wrinkled and dry leathery malodorous. Pt has decreased sensation in the great toe and 2nd-4th digit on the left foot and on the right foot pt has scatter area of red and grey patches with decrease sensation in the feet, which increase towards the arch of each feet. Multiple areas of skin discoloration- left great toe to 4th digit, decreased sensation, right great toes are scatter areas of red and grey patches. decreased sensation.Possible Maceration to feet due to over-exposure to moisture. Complex skin --total body to feet. Called CN to assist in  assessment review, Dr. Teddy Spike and NP [facility] notified. Night RN updated. SRP, RN

## 2018-05-20 NOTE — ED Notes (Signed)
Date and time results received: 05/20/18 1509 (use smartphrase ".now" to insert current time)   Test: Troponin Critical Value: 0.33  Name of Provider Notified: Kathlene November, RN Orders Received? Or Actions Taken?: Actions Taken: Surveyor, mining

## 2018-05-20 NOTE — ED Notes (Signed)
Reported critical lab results Trop 0.33 and Lactic acid 3.1 to provider

## 2018-05-21 ENCOUNTER — Other Ambulatory Visit: Payer: Self-pay

## 2018-05-21 LAB — COMPREHENSIVE METABOLIC PANEL
ALBUMIN: 2.6 g/dL — AB (ref 3.5–5.0)
ALT: 94 U/L — ABNORMAL HIGH (ref 0–44)
AST: 510 U/L — ABNORMAL HIGH (ref 15–41)
Alkaline Phosphatase: 30 U/L — ABNORMAL LOW (ref 38–126)
Anion gap: 14 (ref 5–15)
BUN: 15 mg/dL (ref 6–20)
CALCIUM: 7.4 mg/dL — AB (ref 8.9–10.3)
CO2: 23 mmol/L (ref 22–32)
CREATININE: 0.76 mg/dL (ref 0.61–1.24)
Chloride: 88 mmol/L — ABNORMAL LOW (ref 98–111)
GFR calc Af Amer: >60 mL/min (ref >60)
GFR calc non Af Amer: >60 mL/min (ref >60)
Glucose, Bld: 134 mg/dL — ABNORMAL HIGH (ref 70–99)
Potassium: 2.8 mmol/L — ABNORMAL LOW (ref 3.5–5.1)
SODIUM: 125 mmol/L — AB (ref 135–145)
Total Bilirubin: 0.9 mg/dL (ref 0.3–1.2)
Total Protein: 6.2 g/dL — ABNORMAL LOW (ref 6.5–8.1)

## 2018-05-21 LAB — BLOOD CULTURE ID PANEL (REFLEXED)
Acinetobacter baumannii: DETECTED — AB
CANDIDA ALBICANS: NOT DETECTED
Candida glabrata: NOT DETECTED
Candida krusei: NOT DETECTED
Candida parapsilosis: NOT DETECTED
Candida tropicalis: NOT DETECTED
Carbapenem resistance: NOT DETECTED
ENTEROBACTER CLOACAE COMPLEX: NOT DETECTED
Enterobacteriaceae species: NOT DETECTED
Enterococcus species: NOT DETECTED
Escherichia coli: NOT DETECTED
Haemophilus influenzae: NOT DETECTED
Klebsiella oxytoca: NOT DETECTED
Klebsiella pneumoniae: NOT DETECTED
Listeria monocytogenes: NOT DETECTED
Methicillin resistance: DETECTED — AB
Neisseria meningitidis: NOT DETECTED
Proteus species: NOT DETECTED
Pseudomonas aeruginosa: NOT DETECTED
STREPTOCOCCUS PYOGENES: NOT DETECTED
Serratia marcescens: NOT DETECTED
Staphylococcus aureus (BCID): NOT DETECTED
Staphylococcus species: DETECTED — AB
Streptococcus agalactiae: NOT DETECTED
Streptococcus pneumoniae: NOT DETECTED
Streptococcus species: NOT DETECTED

## 2018-05-21 LAB — GLUCOSE, CAPILLARY
Glucose-Capillary: 99 mg/dL (ref 70–99)
Glucose-Capillary: 107 mg/dL — ABNORMAL HIGH (ref 70–99)
Glucose-Capillary: 96 mg/dL (ref 70–99)
Glucose-Capillary: 114 mg/dL — ABNORMAL HIGH (ref 70–99)

## 2018-05-21 LAB — CBC
HCT: 31.5 % — ABNORMAL LOW (ref 39.0–52.0)
Hemoglobin: 10.6 g/dL — ABNORMAL LOW (ref 13.0–17.0)
MCH: 29.4 pg (ref 26.0–34.0)
MCHC: 33.7 g/dL (ref 30.0–36.0)
MCV: 87.3 fL (ref 80.0–100.0)
Platelets: 90 10*3/uL — ABNORMAL LOW (ref 150–400)
RBC: 3.61 MIL/uL — ABNORMAL LOW (ref 4.22–5.81)
RDW: 14.3 % (ref 11.5–15.5)
WBC: 7.4 10*3/uL (ref 4.0–10.5)
nRBC: 0 % (ref 0.0–0.2)

## 2018-05-21 LAB — TROPONIN I: Troponin I: 0.23 ng/mL (ref <0.03)

## 2018-05-21 LAB — MAGNESIUM: Magnesium: 2 mg/dL (ref 1.7–2.4)

## 2018-05-21 LAB — CK: Total CK: 17614 U/L — ABNORMAL HIGH (ref 49–397)

## 2018-05-21 MED ORDER — ACETAMINOPHEN 325 MG PO TABS
650.0000 mg | ORAL_TABLET | Freq: Once | ORAL | Status: AC
  Administered 2018-05-21: 650 mg via ORAL

## 2018-05-21 MED ORDER — SODIUM CHLORIDE 0.9 % IV BOLUS
500.0000 mL | Freq: Once | INTRAVENOUS | Status: AC
  Administered 2018-05-21: 500 mL via INTRAVENOUS

## 2018-05-21 MED ORDER — SODIUM CHLORIDE 0.9 % IV SOLN
500.0000 mg | Freq: Four times a day (QID) | INTRAVENOUS | Status: DC
  Administered 2018-05-21 – 2018-05-23 (×9): 500 mg via INTRAVENOUS
  Filled 2018-05-21 (×10): qty 500

## 2018-05-21 MED ORDER — HYDROCERIN EX CREA
TOPICAL_CREAM | Freq: Every day | CUTANEOUS | Status: DC
  Administered 2018-05-21 – 2018-05-27 (×6): via TOPICAL
  Filled 2018-05-21 (×2): qty 113

## 2018-05-21 MED ORDER — POTASSIUM CHLORIDE CRYS ER 20 MEQ PO TBCR
40.0000 meq | EXTENDED_RELEASE_TABLET | Freq: Two times a day (BID) | ORAL | Status: AC
  Administered 2018-05-21: 40 meq via ORAL
  Administered 2018-05-21: 20 meq via ORAL
  Filled 2018-05-21 (×2): qty 2

## 2018-05-21 MED ORDER — POTASSIUM CHLORIDE 10 MEQ/100ML IV SOLN
10.0000 meq | INTRAVENOUS | Status: AC
  Administered 2018-05-21 (×4): 10 meq via INTRAVENOUS
  Filled 2018-05-21 (×4): qty 100

## 2018-05-21 MED ORDER — IBUPROFEN 200 MG PO TABS
400.0000 mg | ORAL_TABLET | Freq: Once | ORAL | Status: DC
  Filled 2018-05-21: qty 2

## 2018-05-21 NOTE — Progress Notes (Signed)
CRITICAL VALUE ALERT  Critical Value:  0.23  Date & Time Notied: 05/21/2018 0142  Provider Notified: Bruna Potter NP   Orders Received/Actions taken: awaiting any new orders

## 2018-05-21 NOTE — Progress Notes (Signed)
PHARMACY - PHYSICIAN COMMUNICATION CRITICAL VALUE ALERT - BLOOD CULTURE IDENTIFICATION (BCID)  Jake Nguyen is an 53 y.o. male who presented to [facility] on 05/20/2018 after being found unresponsive.  Assessment:  Patient + for influenza A. BCID call received reporting 1/4 staph species mecA detected, and acinetobacter.  Name of physician (or Provider) Contacted: Dr. Nelson Chimes  Current antibiotics: No antibiotics currently ordered. Pt on Tamiflu 75 mg PO BID  Changes to prescribed antibiotics recommended: Per discussion with MD: -will initiate imipenem per BCID treatment algorithm. Ordered imipenem/cilastin 500 mg IV q6h. Pt is not hypotensive or requiring pressors so will hold off on tobramycin.  1/4 staph species, mecA detected likely contaminant.   Results for orders placed or performed during the hospital encounter of 05/20/18  Blood Culture ID Panel (Reflexed) (Collected: 05/20/2018  7:23 PM)  Result Value Ref Range   Enterococcus species NOT DETECTED NOT DETECTED   Listeria monocytogenes NOT DETECTED NOT DETECTED   Staphylococcus species DETECTED (A) NOT DETECTED   Staphylococcus aureus (BCID) NOT DETECTED NOT DETECTED   Methicillin resistance DETECTED (A) NOT DETECTED   Streptococcus species NOT DETECTED NOT DETECTED   Streptococcus agalactiae NOT DETECTED NOT DETECTED   Streptococcus pneumoniae NOT DETECTED NOT DETECTED   Streptococcus pyogenes NOT DETECTED NOT DETECTED   Acinetobacter baumannii DETECTED (A) NOT DETECTED   Enterobacteriaceae species NOT DETECTED NOT DETECTED   Enterobacter cloacae complex NOT DETECTED NOT DETECTED   Escherichia coli NOT DETECTED NOT DETECTED   Klebsiella oxytoca NOT DETECTED NOT DETECTED   Klebsiella pneumoniae NOT DETECTED NOT DETECTED   Proteus species NOT DETECTED NOT DETECTED   Serratia marcescens NOT DETECTED NOT DETECTED   Carbapenem resistance NOT DETECTED NOT DETECTED   Haemophilus influenzae NOT DETECTED NOT DETECTED   Neisseria  meningitidis NOT DETECTED NOT DETECTED   Pseudomonas aeruginosa NOT DETECTED NOT DETECTED   Candida albicans NOT DETECTED NOT DETECTED   Candida glabrata NOT DETECTED NOT DETECTED   Candida krusei NOT DETECTED NOT DETECTED   Candida parapsilosis NOT DETECTED NOT DETECTED   Candida tropicalis NOT DETECTED NOT DETECTED    Cindi Carbon, PharmD 05/21/2018  1:33 PM
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
## 2018-05-21 NOTE — Progress Notes (Signed)
PROGRESS NOTE    Jake AlbeeDwight L Nguyen  [name]:[id number] DOB: 01/26/1966 DOA: 05/20/2018 PCP: Patient, No Pcp Per   Brief Narrative: 53 y.o. male with medical history significant of alcohol abuse but otherwise limited medical history was brought to the hospital for evaluation of change in mental status and unresponsiveness likely secondary to alcohol intoxication.  History was limited due to his mental status.  Upon admission he was found to be hyponatremic, and rhabdomyolysis with elevated lactate level.  His LFTs were elevated as well.  He was positive for influenza.    Assessment & Plan:   Principal Problem:   Rhabdomyolysis Active Problems:   Hyponatremia   Elevated troponin   Moderate dehydration   Alcohol use   Influenza A   Transaminitis Acute metabolic encephalopathy secondary to alcohol intoxication, improving Acute rhabdomyolysis, improving Moderate dehydration with elevated anion gap, improving - CK levels are trending down with IV fluid.  Still quite elevated therefore continue IV fluids.  Monitor urine output.  No focal neuro deficits.  Closely monitor his neurologic status.  CT of the head is negative - UDS-negative, UA-negative, chest x-ray-negative - Anion gap improved with IV fluids -Diet as tolerated.  Hypokalemia -Aggressive repletion  Hyponatremia, secondary to hypovolemia -Improving with IV fluids.  Elevated troponin -Trended down.  Unlikely ACS.  Currently chest pain-free.  Transaminitis Alcohol use -LFTs are trending down.  Will closely monitor this.  Nutritional support. -Alcohol withdrawal protocol in place.  Folic acid, thiamine and multivitamins ordered.  Influenza A positive -Day 2 Tamiflu.  Supportive care..  DVT prophylaxis: Lovenox Code Status: Full code Family Communication: None at bedside Disposition Plan: Maintain inpatient stay for IV fluids and aggressive treatment for his rhabdomyolysis.  Consultants:   None  Procedures:    None  Antimicrobials:   Tamiflu   Subjective: Patient mentation is little better this morning.  He is awake alert.  Does remember much about what happened over the last couple of days.  Reports of drinking alcohol.  Review of Systems Otherwise negative except as per HPI, including: General = no fevers, chills, dizziness, malaise, fatigue HEENT/EYES = negative for pain, redness, loss of vision, double vision, blurred vision, loss of hearing, sore throat, hoarseness, dysphagia Cardiovascular= negative for chest pain, palpitation, murmurs, lower extremity swelling Respiratory/lungs= negative for shortness of breath, cough, hemoptysis, wheezing, mucus production Gastrointestinal= negative for nausea, vomiting,, abdominal pain, melena, hematemesis Genitourinary= negative for Dysuria, Hematuria, Change in Urinary Frequency MSK = Negative for arthralgia, myalgias, Back Pain, Joint swelling  Neurology= Negative for headache, seizures, numbness, tingling  Psychiatry= Negative for anxiety, depression, suicidal and homocidal ideation Allergy/Immunology= Medication/Food allergy as listed  Skin= Negative for Rash, lesions, ulcers, itching   Objective: Vitals:   05/21/18 0600 05/21/18 0617 05/21/18 0723 05/21/18 0917  BP: 114/63 114/63 105/81 114/84  Pulse:  77 78 80  Resp:   20   Temp:   99.2 F (37.3 C) 99.8 F (37.7 C)  TempSrc:    Oral  SpO2:   95% 99%  Weight:  70.5 kg    Height:        Intake/Output Summary (Last 24 hours) at 05/21/2018 1053 Last data filed at 05/21/2018 0600 Gross per 24 hour  Intake 3726.86 ml  Output 350 ml  Net 3376.86 ml   Filed Weights   05/20/18 2031 05/21/18 0617  Weight: 70.8 kg 70.5 kg    Examination:  Constitutional: Poor hygiene, disheveled. Eyes: PERRL, lids and conjunctivae normal ENMT: Mucous membranes are dry posterior  pharynx clear of any exudate or lesions.Normal dentition.  Neck: normal, supple, no masses, no  thyromegaly Respiratory: clear to auscultation bilaterally, no wheezing, no crackles. Normal respiratory effort. No accessory muscle use.  Cardiovascular: Regular rate and rhythm, no murmurs / rubs / gallops. No extremity edema. 2+ pedal pulses. No carotid bruits.  Abdomen: no tenderness, no masses palpated. No hepatosplenomegaly. Bowel sounds positive.  Musculoskeletal: no clubbing / cyanosis. No joint deformity upper and lower extremities. Good ROM, no contractures. Normal muscle tone.  Skin: no rashes, lesions, ulcers. No induration Neurologic: CN 2-12 grossly intact. Sensation intact, DTR normal. Strength 5/5 in all 4.  Psychiatric: Alert awake oriented X3.  Poor judgment and insight.  Data Reviewed:   CBC: Recent Labs  Lab 05/20/18 1357 05/21/18 0052  WBC 4.3 7.4  NEUTROABS 2.5  --   HGB 14.3 10.6*  HCT 42.6 31.5*  MCV 87.8 87.3  PLT PLATELET CLUMPS NOTED ON SMEAR, UNABLE TO ESTIMATE 90*   Basic Metabolic Panel: Recent Labs  Lab 05/20/18 1357 05/21/18 0052  NA 124* 125*  K 3.9 2.8*  CL 81* 88*  CO2 24 23  GLUCOSE 118* 134*  BUN 24* 15  CREATININE 0.82 0.76  CALCIUM 8.1* 7.4*  MG  --  2.0   GFR: Estimated Creatinine Clearance: 106.9 mL/min (by C-G formula based on SCr of 0.76 mg/dL). Liver Function Tests: Recent Labs  Lab 05/20/18 1357 05/21/18 0052  AST 704* 510*  ALT 128* 94*  ALKPHOS 46 30*  BILITOT 1.5* 0.9  PROT 8.2* 6.2*  ALBUMIN 3.4* 2.6*   No results for input(s): LIPASE, AMYLASE in the last 168 hours. Recent Labs  Lab 05/20/18 1627  AMMONIA 26   Coagulation Profile: Recent Labs  Lab 05/20/18 1357  INR 0.93   Cardiac Enzymes: Recent Labs  Lab 05/20/18 1357 05/20/18 1923 05/21/18 0052  CKTOTAL 18,367*  --  17,614*  TROPONINI 0.33* 0.30* 0.23*   BNP (last 3 results) No results for input(s): PROBNP in the last 8760 hours. HbA1C: No results for input(s): HGBA1C in the last 72 hours. CBG: Recent Labs  Lab 05/20/18 1415  05/20/18 2118 05/21/18 0803  GLUCAP 70 99 99   Lipid Profile: No results for input(s): CHOL, HDL, LDLCALC, TRIG, CHOLHDL, LDLDIRECT in the last 72 hours. Thyroid Function Tests: No results for input(s): TSH, T4TOTAL, FREET4, T3FREE, THYROIDAB in the last 72 hours. Anemia Panel: Recent Labs    05/20/18 1923  VITAMINB12 349   Sepsis Labs: Recent Labs  Lab 05/20/18 1357 05/20/18 1557  LATICACIDVEN 3.1* 1.9    Recent Results (from the past 240 hour(s))  MRSA PCR Screening     Status: None   Collection Time: 05/20/18  6:28 PM  Result Value Ref Range Status   MRSA by PCR NEGATIVE NEGATIVE Final    Comment:        The GeneXpert MRSA Assay (FDA approved for NASAL specimens only), is one component of a comprehensive MRSA colonization surveillance program. It is not intended to diagnose MRSA infection nor to guide or monitor treatment for MRSA infections. Performed at [facility], [address] [place], [place] [address]          Radiology Studies: Dg Chest 1 View  Result Date: 05/20/2018 CLINICAL DATA:  Patient with failure to thrive. EXAM: CHEST  1 VIEW COMPARISON:  None. FINDINGS: Monitoring leads overlie the patient. Normal cardiac and mediastinal contours. No consolidative pulmonary opacities. No pleural effusion or pneumothorax. IMPRESSION: No acute cardiopulmonary  process. Electronically Signed   By: Annia Belt M.D.   On: 05/20/2018 15:53   Ct Head Wo Contrast  Result Date: 05/20/2018 CLINICAL DATA:  Altered mental status, ETOH abuse, failure to thrive EXAM: CT HEAD WITHOUT CONTRAST TECHNIQUE: Contiguous axial images were obtained from the base of the skull through the vertex without intravenous contrast. COMPARISON:  10/27/2017 FINDINGS: Brain: No evidence of acute infarction, hemorrhage, hydrocephalus, extra-axial collection or mass lesion/mass effect. Cortical volume is likely decreased for age. Vascular: No hyperdense vessel or unexpected  calcification. Skull: Normal. Negative for fracture or focal lesion. Sinuses/Orbits: Mild paranasal sinus opacification, maxillary and ethmoid predominant. Mastoid air cells are clear. Other: None. IMPRESSION: Normal head CT. Electronically Signed   By: Charline Bills M.D.   On: 05/20/2018 16:45        Scheduled Meds: . enoxaparin (LOVENOX) injection  40 mg Subcutaneous Q24H  . folic acid  1 mg Oral Daily  . insulin aspart  0-15 Units Subcutaneous TID WC  . LORazepam  0-4 mg Intravenous Q6H   Or  . LORazepam  0-4 mg Oral Q6H  . [START ON 05/23/2018] LORazepam  0-4 mg Intravenous Q12H   Or  . [START ON 05/23/2018] LORazepam  0-4 mg Oral Q12H  . multivitamin with minerals  1 tablet Oral Daily  . oseltamivir  75 mg Oral BID  . potassium chloride  40 mEq Oral BID  . thiamine  100 mg Oral Daily   Or  . thiamine  100 mg Intravenous Daily   Continuous Infusions: . sodium chloride 100 mL/hr at 05/21/18 0253  . potassium chloride 10 mEq (05/21/18 0928)     LOS: 1 day   Time spent= 35 mins    Ankit Joline Maxcy, MD [facility]  If 7PM-7AM, please contact night-coverage [email or web address] 05/21/2018, 10:53 AM

## 2018-05-21 NOTE — Progress Notes (Signed)
MEWS 3, VS assessed. Provider paged with update

## 2018-05-21 NOTE — Significant Event (Addendum)
Rapid Response Event Note  Overview: Time Called: 2126 Arrival Time: 2130 Event Type: Neurologic  Initial Focused Assessment: Patient lying in bed. Responds to pain, will not follow commands or open eyes. Patient is warm, dry to touch. Has current rectal temperature of 102F. Patient had been awake and taking in oral medication at 1915, now only responding to pain. Breathing is intermittently sonorous. Patient is on continuous pulse oximetry monitoring, oxygen saturation >94% on 2LNC. HR 90-110 bpm. BP 117/78. Pupils equal, round and reactive. Sclera redden. CBG check, 114.  Patient admitted on 05/20/18 for change in mental status and unresponsiveness.   Interventions: Patient received Tylenol at 1915 and just completed a 500 cc bolus. Temperature is coming down but is currently at 102F, rectally.   CT head completed on admission. CXR completed on admission. Sodium 124 this morning. EKG completed at 1654. No other acute changes noted by primary RN, other than patient's drowsiness at this time.  NP notified of patient assessment, and change in mentation from today at 84. NP advised primary RN to recheck the patient's temperature at 2230. NP ordered a second 500 cc bolus to be given. NP to call primary RN back at 2230 for update on patient status. NP advises that the patient's mentation change is most-likely related to his fever.   Plan of Care (if not transferred): Patient currently maintaining his vital signs and intermittently responding with speech, but not following commands or stating his name when asked to. I recommend when the primary RN speaks with NP at 2230, if patient's mentation hasn't improved, to ask if checking an ABG would be beneficial since the patient was unable to provide his medical history now or on admission.   RN to contact rapid response for further needs.   Event Summary:  Jake Nguyen

## 2018-05-21 NOTE — Progress Notes (Signed)
Rectal temp reassessed, now 102.7. On call provider paged. Will give tylenol

## 2018-05-22 DIAGNOSIS — R7881 Bacteremia: Secondary | ICD-10-CM

## 2018-05-22 LAB — COMPREHENSIVE METABOLIC PANEL
ALK PHOS: 40 U/L (ref 38–126)
ALT: 110 U/L — ABNORMAL HIGH (ref 0–44)
AST: 669 U/L — ABNORMAL HIGH (ref 15–41)
Albumin: 2.5 g/dL — ABNORMAL LOW (ref 3.5–5.0)
Anion gap: 12 (ref 5–15)
BUN: 6 mg/dL (ref 6–20)
CALCIUM: 7.8 mg/dL — AB (ref 8.9–10.3)
CO2: 25 mmol/L (ref 22–32)
Chloride: 95 mmol/L — ABNORMAL LOW (ref 98–111)
Creatinine, Ser: 0.53 mg/dL — ABNORMAL LOW (ref 0.61–1.24)
Glucose, Bld: 93 mg/dL (ref 70–99)
Potassium: 2.8 mmol/L — ABNORMAL LOW (ref 3.5–5.1)
Sodium: 132 mmol/L — ABNORMAL LOW (ref 135–145)
Total Bilirubin: 0.9 mg/dL (ref 0.3–1.2)
Total Protein: 6.7 g/dL (ref 6.5–8.1)

## 2018-05-22 LAB — GLUCOSE, CAPILLARY
Glucose-Capillary: 97 mg/dL (ref 70–99)
Glucose-Capillary: 150 mg/dL — ABNORMAL HIGH (ref 70–99)
Glucose-Capillary: 146 mg/dL — ABNORMAL HIGH (ref 70–99)
Glucose-Capillary: 138 mg/dL — ABNORMAL HIGH (ref 70–99)

## 2018-05-22 LAB — CBC
HCT: 35.3 % — ABNORMAL LOW (ref 39.0–52.0)
Hemoglobin: 11.3 g/dL — ABNORMAL LOW (ref 13.0–17.0)
MCH: 29 pg (ref 26.0–34.0)
MCHC: 32 g/dL (ref 30.0–36.0)
MCV: 90.7 fL (ref 80.0–100.0)
NRBC: 0.2 % (ref 0.0–0.2)
Platelets: 124 10*3/uL — ABNORMAL LOW (ref 150–400)
RBC: 3.89 MIL/uL — ABNORMAL LOW (ref 4.22–5.81)
RDW: 14.9 % (ref 11.5–15.5)
WBC: 8.5 10*3/uL (ref 4.0–10.5)

## 2018-05-22 LAB — HIV ANTIBODY (ROUTINE TESTING W REFLEX): HIV Screen 4th Generation wRfx: NONREACTIVE

## 2018-05-22 LAB — MAGNESIUM: Magnesium: 2 mg/dL (ref 1.7–2.4)

## 2018-05-22 LAB — URINE CULTURE

## 2018-05-22 LAB — CK
Total CK: 23740 U/L — ABNORMAL HIGH (ref 49–397)
Total CK: 17441 U/L — ABNORMAL HIGH (ref 49–397)

## 2018-05-22 LAB — FOLATE RBC
Folate, Hemolysate: 329 ng/mL
Folate, RBC: 904 ng/mL (ref >498)
HEMATOCRIT: 36.4 % — AB (ref 37.5–51.0)

## 2018-05-22 LAB — BASIC METABOLIC PANEL
Anion gap: 8 (ref 5–15)
CO2: 28 mmol/L (ref 22–32)
Calcium: 7.9 mg/dL — ABNORMAL LOW (ref 8.9–10.3)
Chloride: 98 mmol/L (ref 98–111)
Creatinine, Ser: 0.57 mg/dL — ABNORMAL LOW (ref 0.61–1.24)
GFR calc Af Amer: >60 mL/min (ref >60)
GFR calc non Af Amer: >60 mL/min (ref >60)
Glucose, Bld: 131 mg/dL — ABNORMAL HIGH (ref 70–99)
Potassium: 3 mmol/L — ABNORMAL LOW (ref 3.5–5.1)
Sodium: 134 mmol/L — ABNORMAL LOW (ref 135–145)

## 2018-05-22 MED ORDER — POTASSIUM CHLORIDE CRYS ER 20 MEQ PO TBCR
40.0000 meq | EXTENDED_RELEASE_TABLET | ORAL | Status: AC
  Administered 2018-05-22 (×2): 40 meq via ORAL
  Filled 2018-05-22 (×2): qty 2

## 2018-05-22 MED ORDER — POLYVINYL ALCOHOL 1.4 % OP SOLN: 1.0000 [drp] | OPHTHALMIC | Status: DC | PRN

## 2018-05-22 MED ORDER — PHENOL 1.4 % MT LIQD: 1.0000 | OROMUCOSAL | Status: DC | PRN

## 2018-05-22 MED ORDER — LORATADINE 10 MG PO TABS
10.0000 mg | ORAL_TABLET | Freq: Every day | ORAL | Status: DC | PRN
  Administered 2018-05-22: 10 mg via ORAL
  Filled 2018-05-22: qty 1

## 2018-05-22 MED ORDER — SALINE SPRAY 0.65 % NA SOLN
1.0000 | NASAL | Status: DC | PRN
  Administered 2018-05-22: 1 via NASAL
  Filled 2018-05-22: qty 44

## 2018-05-22 MED ORDER — HYDROCORTISONE 1 % EX CREA: 1.0000 "application " | TOPICAL_CREAM | Freq: Three times a day (TID) | CUTANEOUS | Status: DC | PRN

## 2018-05-22 MED ORDER — VANCOMYCIN HCL IN DEXTROSE 1-5 GM/200ML-% IV SOLN
1000.0000 mg | Freq: Two times a day (BID) | INTRAVENOUS | Status: DC
  Administered 2018-05-22 – 2018-05-23 (×2): 1000 mg via INTRAVENOUS
  Filled 2018-05-22 (×3): qty 200

## 2018-05-22 MED ORDER — POTASSIUM CHLORIDE 10 MEQ/100ML IV SOLN
10.0000 meq | INTRAVENOUS | Status: AC
  Administered 2018-05-22 (×4): 10 meq via INTRAVENOUS
  Filled 2018-05-22 (×4): qty 100

## 2018-05-22 MED ORDER — VANCOMYCIN HCL 10 G IV SOLR
1500.0000 mg | INTRAVENOUS | Status: AC
  Administered 2018-05-22: 1500 mg via INTRAVENOUS
  Filled 2018-05-22: qty 1500

## 2018-05-22 MED ORDER — HYDROCORTISONE 2.5 % RE CREA: 1.0000 "application " | TOPICAL_CREAM | Freq: Four times a day (QID) | RECTAL | Status: DC | PRN

## 2018-05-22 MED ORDER — IBUPROFEN 200 MG PO TABS
400.0000 mg | ORAL_TABLET | Freq: Three times a day (TID) | ORAL | Status: DC | PRN
  Administered 2018-05-22: 400 mg via ORAL
  Filled 2018-05-22: qty 2

## 2018-05-22 MED ORDER — GUAIFENESIN-DM 100-10 MG/5ML PO SYRP: 5.0000 mL | ORAL_SOLUTION | ORAL | Status: DC | PRN

## 2018-05-22 MED ORDER — ALUM & MAG HYDROXIDE-SIMETH 200-200-20 MG/5ML PO SUSP: 30.0000 mL | ORAL | Status: DC | PRN

## 2018-05-22 MED ORDER — LIP MEDEX EX OINT
1.0000 "application " | TOPICAL_OINTMENT | CUTANEOUS | Status: DC | PRN
  Filled 2018-05-22: qty 7

## 2018-05-22 MED ORDER — MUSCLE RUB 10-15 % EX CREA: 1.0000 "application " | TOPICAL_CREAM | CUTANEOUS | Status: DC | PRN

## 2018-05-22 NOTE — Progress Notes (Signed)
Pt's temp is coming down, 101.4 rectal temp. Pt is still drowsy, but is now following commands to swallow water and oral medication. He is speaking intermittently, but I am not able to understand what he is saying. Updated on-call TRH NP. All other vitals WNL. Administered tylenol per order, will continue to monitor closely.

## 2018-05-22 NOTE — Progress Notes (Signed)
Pharmacy Antibiotic Note  Jake Nguyen is a 53 y.o. male admitted on 05/20/2018 with bacteremia.  Pharmacy has been consulted for vancomycin dosing.  Patient was found unresponsive and brought to ED. Pt has PMH significant for EtOH abuse. Blood cultures positive for 1/4 Acinetobacter baumannii and staphylococcus species mecA detected (CoNS). Pt also influenza +.   Currently on tamiflu and imipenem/cilastin. Pharmacy consulted to dose vancomycin.  Today, 05/22/18  WBC 8.5  SCr stable, WNL. CrCl ~90 mL/min  Tmax 103 F  Plan:  Vancomycin 1500 mg IV once loading dose followed by vancomycin 1000 mg IV q12h for estimated AUC ~ 420  Goal AUC 400-500   Continue imipenem/cilastin 500 mg IV q6h per MD  Tamiflu 75 mg PO BID per MD  Monitor culture data for sensitivities, renal function, and vancomycin levels at steady state if indicated  Height: 5\' 8"  (172.7 cm) Weight: 155 lb 6.8 oz (70.5 kg) IBW/kg (Calculated) : 68.4  Temp (24hrs), Avg:100.2 F (37.9 C), Min:98 F (36.7 C), Max:103 F (39.4 C)  Recent Labs  Lab 05/20/18 1357 05/20/18 1557 05/21/18 0052 05/22/18 0508  WBC 4.3  --  7.4 8.5  CREATININE 0.82  --  0.76 0.53*  LATICACIDVEN 3.1* 1.9  --   --     Estimated Creatinine Clearance: 106.9 mL/min (A) (by C-G formula based on SCr of 0.53 mg/dL (L)).    Allergies  Allergen Reactions  . Pork-Derived Products Other (See Comments)    Pt states he doesn't eat pork    Antimicrobials this admission: Imipenem/cilastin 2/10 >>  vancomycin 2/11 >>  Tamiflu 2/10 >>  Dose adjustments this admission:  Microbiology results: 2/9 BCx: 1/4 acinetobacter; staphylococcus species methicillin resistance detected (CoNS) 2/9 UCx: Multiple species, suggest recollection  2/9 MRSA PCR: Negative  Thank you for allowing pharmacy to be a part of this patient's care.  Cindi Carbon, PharmD 05/22/2018 11:54 AM

## 2018-05-22 NOTE — Progress Notes (Signed)
PROGRESS NOTE    Jake Nguyen  OZH:[id number] DOB: 04/30/1965 DOA: 05/20/2018 PCP: Patient, No Pcp Per   Brief Narrative: 53 y.o. male with medical history significant of alcohol abuse but otherwise limited medical history was brought to the hospital for evaluation of change in mental status and unresponsiveness likely secondary to alcohol intoxication.  History was limited due to his mental status.  Upon admission he was found to be hyponatremic, and rhabdomyolysis with elevated lactate level.  His LFTs were elevated as well.  He was positive for influenza.  He was febrile during the hospitalization and blood cultures ended up growing Acinetobacter and 1 bottle staph aureus with methicillin resistance.  Patient started on imipenem and vancomycin.    Assessment & Plan:   Principal Problem:   Rhabdomyolysis Active Problems:   Hyponatremia   Elevated troponin   Moderate dehydration   Alcohol use   Influenza A   Transaminitis Acute metabolic encephalopathy secondary to alcohol intoxication, improving Acute rhabdomyolysis, worsening Moderate dehydration with elevated anion gap, resolved - CK levels have trended up.  Will increase normal saline to 150 cc/h.  Monitor urine output.  CT of the head was negative at the time of admission.  Encourage oral diet.  Bacteremia-Acinetobacter. -Blood cultures are positive for Acinetobacter.  Also positive for what appears to be MRSA just in 1 bottle.  Due to his fevers overnight, he has been started on vancomycin and imipenem. -We will repeat cultures again today.  If this persistently remains positive, he will definitely need further work-up.  Hypokalemia -Aggressive repletion  Hyponatremia, secondary to hypovolemia -Improved quite a bit to hydration.  Sodium is 132.  Elevated troponin -Trended down.  Unlikely ACS.  Currently chest pain-free.  Transaminitis Alcohol use -LFTs are stable at this time.  Continue to provide nutritional  support.  If continues to go up, consider treatment for alcoholic hepatitis with prednisone.  No indication at this moment. -Alcohol withdrawal protocol in place.  Folic acid, thiamine and multivitamins ordered.  Influenza A positive -Day 3 Tamiflu.  Supportive care..  Thrombocytopenia -Secondary to alcohol use.  DVT prophylaxis: Lovenox Code Status: Full code Family Communication: None at bedside Disposition Plan: Maintain in hospital stay for IV antibiotics and fluids.  He also needs aggressive fluids for worsening rhabdomyolysis.  Consultants:   None  Procedures:   None  Antimicrobials:   Tamiflu   Subjective: Patient had several episodes of fever over the last 24 hours.  No new complaints this morning.  Review of Systems Otherwise negative except as per HPI, including: General = no fevers, chills, dizziness, malaise, fatigue HEENT/EYES = negative for pain, redness, loss of vision, double vision, blurred vision, loss of hearing, sore throat, hoarseness, dysphagia Cardiovascular= negative for chest pain, palpitation, murmurs, lower extremity swelling Respiratory/lungs= negative for shortness of breath, cough, hemoptysis, wheezing, mucus production Gastrointestinal= negative for nausea, vomiting,, abdominal pain, melena, hematemesis Genitourinary= negative for Dysuria, Hematuria, Change in Urinary Frequency MSK = Negative for arthralgia, myalgias, Back Pain, Joint swelling  Neurology= Negative for headache, seizures, numbness, tingling  Psychiatry= Negative for anxiety, depression, suicidal and homocidal ideation Allergy/Immunology= Medication/Food allergy as listed  Skin= Negative for Rash, lesions, ulcers, itching    Objective: Vitals:   05/22/18 0259 05/22/18 0513 05/22/18 0525 05/22/18 0806  BP:  137/82    Pulse: 81 98 88   Resp:   20   Temp: 100.1 F (37.8 C)  (!) 100.4 F (38 C) 99.3 F (37.4 C)  TempSrc: Rectal  Rectal Oral  SpO2: 97% 100% 94%     Weight:      Height:        Intake/Output Summary (Last 24 hours) at 05/22/2018 1126 Last data filed at 05/22/2018 0813 Gross per 24 hour  Intake 3151.37 ml  Output 3250 ml  Net -98.63 ml   Filed Weights   05/20/18 2031 05/21/18 0617  Weight: 70.8 kg 70.5 kg    Examination: Constitutional: NAD, calm, comfortable, disheveled and poor hygiene Eyes: PERRL, lids and conjunctivae normal ENMT: Mucous membranes are moist. Posterior pharynx clear of any exudate or lesions.Normal dentition.  Neck: normal, supple, no masses, no thyromegaly Respiratory: clear to auscultation bilaterally, no wheezing, no crackles. Normal respiratory effort. No accessory muscle use.  Cardiovascular: Regular rate and rhythm, no murmurs / rubs / gallops. No extremity edema. 2+ pedal pulses. No carotid bruits.  Abdomen: no tenderness, no masses palpated. No hepatosplenomegaly. Bowel sounds positive.  Musculoskeletal: no clubbing / cyanosis. No joint deformity upper and lower extremities. Good ROM, no contractures. Normal muscle tone.  Skin: no rashes, lesions, ulcers. No induration Neurologic: CN 2-12 grossly intact. Sensation intact, DTR normal. Strength 4/5 in all 4.  Psychiatric: Normal judgment and insight. Alert and oriented x 3. Normal mood.   Data Reviewed:   CBC: Recent Labs  Lab 05/20/18 1357 05/21/18 0052 05/22/18 0508  WBC 4.3 7.4 8.5  NEUTROABS 2.5  --   --   HGB 14.3 10.6* 11.3*  HCT 42.6 31.5* 35.3*  MCV 87.8 87.3 90.7  PLT PLATELET CLUMPS NOTED ON SMEAR, UNABLE TO ESTIMATE 90* 124*   Basic Metabolic Panel: Recent Labs  Lab 05/20/18 1357 05/21/18 0052 05/22/18 0508  NA 124* 125* 132*  K 3.9 2.8* 2.8*  CL 81* 88* 95*  CO2 24 23 25   GLUCOSE 118* 134* 93  BUN 24* 15 6  CREATININE 0.82 0.76 0.53*  CALCIUM 8.1* 7.4* 7.8*  MG  --  2.0 2.0   GFR: Estimated Creatinine Clearance: 106.9 mL/min (A) (by C-G formula based on SCr of 0.53 mg/dL (L)). Liver Function Tests: Recent Labs   Lab 05/20/18 1357 05/21/18 0052 05/22/18 0508  AST 704* 510* 669*  ALT 128* 94* 110*  ALKPHOS 46 30* 40  BILITOT 1.5* 0.9 0.9  PROT 8.2* 6.2* 6.7  ALBUMIN 3.4* 2.6* 2.5*   No results for input(s): LIPASE, AMYLASE in the last 168 hours. Recent Labs  Lab 05/20/18 1627  AMMONIA 26   Coagulation Profile: Recent Labs  Lab 05/20/18 1357  INR 0.93   Cardiac Enzymes: Recent Labs  Lab 05/20/18 1357 05/20/18 1923 05/21/18 0052 05/22/18 0508  CKTOTAL 18,367*  --  17,614* 23,740*  TROPONINI 0.33* 0.30* 0.23*  --    BNP (last 3 results) No results for input(s): PROBNP in the last 8760 hours. HbA1C: No results for input(s): HGBA1C in the last 72 hours. CBG: Recent Labs  Lab 05/21/18 0803 05/21/18 1200 05/21/18 1710 05/21/18 2139 05/22/18 0800  GLUCAP 99 107* 96 114* 97   Lipid Profile: No results for input(s): CHOL, HDL, LDLCALC, TRIG, CHOLHDL, LDLDIRECT in the last 72 hours. Thyroid Function Tests: No results for input(s): TSH, T4TOTAL, FREET4, T3FREE, THYROIDAB in the last 72 hours. Anemia Panel: Recent Labs    05/20/18 1923  VITAMINB12 349   Sepsis Labs: Recent Labs  Lab 05/20/18 1357 05/20/18 1557  LATICACIDVEN 3.1* 1.9    Recent Results (from the past 240 hour(s))  Urine culture     Status: Abnormal   Collection  Time: 05/20/18  2:58 PM  Result Value Ref Range Status   Specimen Description   Final    URINE, RANDOM Performed at [facility], [address] [place], [place] [address]    Special Requests   Final    NONE Performed at [facility], [address] [place], [place] [address]    Culture MULTIPLE SPECIES PRESENT, SUGGEST RECOLLECTION (A)  Final   Report Status 05/22/2018 FINAL  Final  MRSA PCR Screening     Status: None   Collection Time: 05/20/18  6:28 PM  Result Value Ref Range Status   MRSA by PCR NEGATIVE NEGATIVE Final    Comment:        The GeneXpert MRSA Assay (FDA approved for NASAL  specimens only), is one component of a comprehensive MRSA colonization surveillance program. It is not intended to diagnose MRSA infection nor to guide or monitor treatment for MRSA infections. Performed at [facility], [address] [place], [place] [address]   Culture, blood (Routine X 2) w Reflex to ID Panel     Status: None (Preliminary result)   Collection Time: 05/20/18  7:23 PM  Result Value Ref Range Status   Specimen Description   Final    BLOOD BLOOD RIGHT HAND Performed at [facility], [address] [place], [place] [address]    Special Requests   Final    BOTTLES DRAWN AEROBIC ONLY Blood Culture adequate volume Performed at [facility], [address] [place], [place] [address]    Culture  Setup Time   Final    GRAM POSITIVE COCCI GRAM NEGATIVE COCCOBACILLI AEROBIC BOTTLE ONLY CRITICAL RESULT CALLED TO, READ BACK BY AND VERIFIED WITH: PHARMD Santosuosso, E [phone] FCP    Culture   Final    GRAM POSITIVE COCCI GRAM NEGATIVE COCCOBACILLI CULTURE REINCUBATED FOR BETTER GROWTH Performed at [facility], [address] [place], [place] [address]    Report Status PENDING  Incomplete  Culture, blood (Routine X 2) w Reflex to ID Panel     Status: None (Preliminary result)   Collection Time: 05/20/18  7:23 PM  Result Value Ref Range Status   Specimen Description   Final    BLOOD RIGHT ANTECUBITAL Performed at [facility], [address] [place], [place] [address]    Special Requests   Final    BOTTLES DRAWN AEROBIC ONLY Blood Culture adequate volume Performed at [facility], [address] [place], [place] [address]    Culture   Final    NO GROWTH 2 DAYS Performed at [facility], [address] [place], [place] [address]    Report Status PENDING  Incomplete  Blood Culture ID Panel (Reflexed)     Status: Abnormal   Collection Time: 05/20/18   7:23 PM  Result Value Ref Range Status   Enterococcus species NOT DETECTED NOT DETECTED Final   Listeria monocytogenes NOT DETECTED NOT DETECTED Final   Staphylococcus species DETECTED (A) NOT DETECTED Final    Comment: Methicillin (oxacillin) resistant coagulase negative staphylococcus. Possible blood culture contaminant (unless isolated from more than one blood culture draw or clinical case suggests pathogenicity). No antibiotic treatment is indicated for blood  culture contaminants. CRITICAL RESULT CALLED TO, READ BACK BY AND VERIFIED WITH: PHARMD Manus, E [id number] FCP    Staphylococcus aureus (BCID) NOT DETECTED NOT DETECTED Final   Methicillin resistance DETECTED (A) NOT  DETECTED Final    Comment: CRITICAL RESULT CALLED TO, READ BACK BY AND VERIFIED WITH: PHARMD [place], E [phone] FCP    Streptococcus species NOT DETECTED NOT DETECTED Final   Streptococcus agalactiae NOT DETECTED NOT DETECTED Final   Streptococcus pneumoniae NOT DETECTED NOT DETECTED Final   Streptococcus pyogenes NOT DETECTED NOT DETECTED Final   Acinetobacter baumannii DETECTED (A) NOT DETECTED Final    Comment: CRITICAL RESULT CALLED TO, READ BACK BY AND VERIFIED WITH: PHARMD Husser, E [phone] FCP    Enterobacteriaceae species NOT DETECTED NOT DETECTED Final   Enterobacter cloacae complex NOT DETECTED NOT DETECTED Final   Escherichia coli NOT DETECTED NOT DETECTED Final   Klebsiella oxytoca NOT DETECTED NOT DETECTED Final   Klebsiella pneumoniae NOT DETECTED NOT DETECTED Final   Proteus species NOT DETECTED NOT DETECTED Final   Serratia marcescens NOT DETECTED NOT DETECTED Final   Carbapenem resistance NOT DETECTED NOT DETECTED Final   Haemophilus influenzae NOT DETECTED NOT DETECTED Final   Neisseria meningitidis NOT DETECTED NOT DETECTED Final   Pseudomonas aeruginosa NOT DETECTED NOT DETECTED Final   Candida albicans NOT DETECTED NOT DETECTED Final   Candida glabrata NOT DETECTED NOT  DETECTED Final   Candida krusei NOT DETECTED NOT DETECTED Final   Candida parapsilosis NOT DETECTED NOT DETECTED Final   Candida tropicalis NOT DETECTED NOT DETECTED Final         Radiology Studies: Dg Chest 1 View  Result Date: 05/20/2018 CLINICAL DATA:  Patient with failure to thrive. EXAM: CHEST  1 VIEW COMPARISON:  None. FINDINGS: Monitoring leads overlie the patient. Normal cardiac and mediastinal contours. No consolidative pulmonary opacities. No pleural effusion or pneumothorax. IMPRESSION: No acute cardiopulmonary process. Electronically Signed   By: Annia Belt M.D.   On: 05/20/2018 15:53   Ct Head Wo Contrast  Result Date: 05/20/2018 CLINICAL DATA:  Altered mental status, ETOH abuse, failure to thrive EXAM: CT HEAD WITHOUT CONTRAST TECHNIQUE: Contiguous axial images were obtained from the base of the skull through the vertex without intravenous contrast. COMPARISON:  10/27/2017 FINDINGS: Brain: No evidence of acute infarction, hemorrhage, hydrocephalus, extra-axial collection or mass lesion/mass effect. Cortical volume is likely decreased for age. Vascular: No hyperdense vessel or unexpected calcification. Skull: Normal. Negative for fracture or focal lesion. Sinuses/Orbits: Mild paranasal sinus opacification, maxillary and ethmoid predominant. Mastoid air cells are clear. Other: None. IMPRESSION: Normal head CT. Electronically Signed   By: Charline Bills M.D.   On: 05/20/2018 16:45        Scheduled Meds: . enoxaparin (LOVENOX) injection  40 mg Subcutaneous Q24H  . folic acid  1 mg Oral Daily  . hydrocerin   Topical Daily  . insulin aspart  0-15 Units Subcutaneous TID WC  . LORazepam  0-4 mg Intravenous Q6H   Or  . LORazepam  0-4 mg Oral Q6H  . [START ON 05/23/2018] LORazepam  0-4 mg Intravenous Q12H   Or  . [START ON 05/23/2018] LORazepam  0-4 mg Oral Q12H  . multivitamin with minerals  1 tablet Oral Daily  . oseltamivir  75 mg Oral BID  . potassium chloride  40 mEq  Oral Q4H  . thiamine  100 mg Oral Daily   Or  . thiamine  100 mg Intravenous Daily   Continuous Infusions: . sodium chloride 150 mL/hr at 05/22/18 0912  . imipenem-cilastatin 500 mg (05/22/18 0700)  . potassium chloride 10 mEq (05/22/18 1016)  . vancomycin 1,500 mg (05/22/18 1009)     LOS:  2 days   Time spent= 40 mins    Ankit Joline Maxcy, MD [facility]  If 7PM-7AM, please contact night-coverage [email or web address] 05/22/2018, 11:26 AM

## 2018-05-22 NOTE — Progress Notes (Signed)
CSW consutl-Self care issues  CSW met with the patient at bedside 2/10. Patient alert to place and self. Patient appeared drowsy and somewhat confused by the questions presented.. Patient reports he is homeless in the area. He understands he is in the hospital because he is sick. Patient informed CSW he has two brothers but unable to recall their contact information.   CSW will continue to follow this patient.   Kathrin Greathouse, Marlinda Mike, MSW Clinical Social Worker  [phone]

## 2018-05-23 DIAGNOSIS — R4182 Altered mental status, unspecified: Secondary | ICD-10-CM

## 2018-05-23 LAB — CBC
HCT: 31 % — ABNORMAL LOW (ref 39.0–52.0)
Hemoglobin: 10 g/dL — ABNORMAL LOW (ref 13.0–17.0)
MCH: 28.5 pg (ref 26.0–34.0)
MCHC: 32.3 g/dL (ref 30.0–36.0)
MCV: 88.3 fL (ref 80.0–100.0)
Platelets: 181 10*3/uL (ref 150–400)
RBC: 3.51 MIL/uL — ABNORMAL LOW (ref 4.22–5.81)
RDW: 15 % (ref 11.5–15.5)
WBC: 8.3 10*3/uL (ref 4.0–10.5)
nRBC: 0 % (ref 0.0–0.2)

## 2018-05-23 LAB — CULTURE, BLOOD (ROUTINE X 2): Special Requests: ADEQUATE

## 2018-05-23 LAB — MAGNESIUM: Magnesium: 1.7 mg/dL (ref 1.7–2.4)

## 2018-05-23 LAB — COMPREHENSIVE METABOLIC PANEL
ALBUMIN: 2.3 g/dL — AB (ref 3.5–5.0)
ALT: 100 U/L — ABNORMAL HIGH (ref 0–44)
AST: 503 U/L — ABNORMAL HIGH (ref 15–41)
Alkaline Phosphatase: 39 U/L (ref 38–126)
Anion gap: 10 (ref 5–15)
BUN: <5 mg/dL — ABNORMAL LOW (ref 6–20)
CO2: 25 mmol/L (ref 22–32)
CREATININE: 0.57 mg/dL — AB (ref 0.61–1.24)
Calcium: 7.6 mg/dL — ABNORMAL LOW (ref 8.9–10.3)
Chloride: 99 mmol/L (ref 98–111)
GFR calc Af Amer: >60 mL/min (ref >60)
GFR calc non Af Amer: >60 mL/min (ref >60)
GLUCOSE: 139 mg/dL — AB (ref 70–99)
Potassium: 2.8 mmol/L — ABNORMAL LOW (ref 3.5–5.1)
Sodium: 134 mmol/L — ABNORMAL LOW (ref 135–145)
Total Bilirubin: 0.6 mg/dL (ref 0.3–1.2)
Total Protein: 6.1 g/dL — ABNORMAL LOW (ref 6.5–8.1)

## 2018-05-23 LAB — GLUCOSE, CAPILLARY
GLUCOSE-CAPILLARY: 197 mg/dL — AB (ref 70–99)
Glucose-Capillary: 329 mg/dL — ABNORMAL HIGH (ref 70–99)
Glucose-Capillary: 106 mg/dL — ABNORMAL HIGH (ref 70–99)
Glucose-Capillary: 175 mg/dL — ABNORMAL HIGH (ref 70–99)

## 2018-05-23 LAB — HEMOGLOBIN A1C
Hgb A1c MFr Bld: 6.9 % — ABNORMAL HIGH (ref 4.8–5.6)
Mean Plasma Glucose: 151.33 mg/dL

## 2018-05-23 LAB — CK: Total CK: 11546 U/L — ABNORMAL HIGH (ref 49–397)

## 2018-05-23 MED ORDER — MAGNESIUM SULFATE 2 GM/50ML IV SOLN
2.0000 g | Freq: Once | INTRAVENOUS | Status: AC
  Administered 2018-05-23: 2 g via INTRAVENOUS
  Filled 2018-05-23: qty 50

## 2018-05-23 MED ORDER — SODIUM CHLORIDE 0.9 % IV SOLN
INTRAVENOUS | Status: AC
  Administered 2018-05-23 – 2018-05-26 (×8): via INTRAVENOUS

## 2018-05-23 MED ORDER — SODIUM CHLORIDE 0.9 % IV SOLN
3.0000 g | Freq: Four times a day (QID) | INTRAVENOUS | Status: DC
  Administered 2018-05-23 – 2018-05-27 (×15): 3 g via INTRAVENOUS
  Filled 2018-05-23 (×17): qty 3

## 2018-05-23 MED ORDER — METHYLPREDNISOLONE SODIUM SUCC 40 MG IJ SOLR
40.0000 mg | Freq: Once | INTRAMUSCULAR | Status: AC
  Administered 2018-05-23: 40 mg via INTRAVENOUS
  Filled 2018-05-23: qty 1

## 2018-05-23 MED ORDER — POTASSIUM CHLORIDE CRYS ER 20 MEQ PO TBCR
40.0000 meq | EXTENDED_RELEASE_TABLET | Freq: Two times a day (BID) | ORAL | Status: AC
  Administered 2018-05-23 (×2): 40 meq via ORAL
  Filled 2018-05-23 (×2): qty 2

## 2018-05-23 NOTE — Plan of Care (Signed)
  Problem: Clinical Measurements: Goal: Respiratory complications will improve Outcome: Progressing   Problem: Activity: Goal: Risk for activity intolerance will decrease Outcome: Progressing   Problem: Nutrition: Goal: Adequate nutrition will be maintained Outcome: Progressing   

## 2018-05-23 NOTE — Progress Notes (Signed)
Report received from K. Garcia, RN. No change from initial pm assessment. Will continue to monitor and follow the POC.  

## 2018-05-23 NOTE — Progress Notes (Signed)
PROGRESS NOTE    Jake Nguyen  QPR:[id number] DOB: 25-Sep-1965 DOA: 05/20/2018 PCP: Patient, No Pcp Per    Brief Narrative:  53 y.o.malewith medical history significant ofalcohol abuse but otherwise limited medical history was brought to the hospital for evaluation of change in mental status and unresponsiveness likely secondary to alcohol intoxication.  History was limited due to his mental status.  Upon admission he was found to be hyponatremic, and rhabdomyolysis with elevated lactate level.  His LFTs were elevated as well.  He was positive for influenza.  He was febrile during the hospitalization and blood cultures ended up growing Acinetobacter and 1 bottle staph aureus with methicillin resistance.  Patient started on imipenem and vancomycin.   Assessment & Plan:   Principal Problem:   Rhabdomyolysis Active Problems:   Hyponatremia   Elevated troponin   Moderate dehydration   Alcohol use   Influenza A   Transaminitis  Acute metabolic encephalopathy secondary to alcohol intoxication, improving Acute rhabdomyolysis, worsening Moderate dehydration with elevated anion gap, resolved -CK trending down with IVF hydration -Repeat CK in AM -Plan to continue aggressive IVF hydration until CK is at least <5,000  Bacteremia-Acinetobacter. -Blood cultures are positive for Acinetobacter.  Also positive for what appears to be MRSA just in 1 bottle.  Due to his fevers overnight, he has been started on vancomycin and imipenem. -Repeat cultures neg  Hypokalemia -Low at 2.8. Replaced  Hyponatremia, secondary to hypovolemia -Improved with hydration -Na currently 134. Repeat bmet in AM  Elevated troponin -Trended down.  Unlikely ACS.  Presently chest pain-free.  Transaminitis Alcohol use -LFTs trending down -Alcohol withdrawal protocol in place. Folic acid, thiamine and multivitamins ordered.  Influenza A positive -Day 4 Tamiflu.  Supportive care. - Stable at  present  Thrombocytopenia -Likely secondary to alcohol use. -Repeat CBC in AM  DVT prophylaxis: Lovenox subQ Code Status: Full Family Communication: Pt in room, family not at bedside Disposition Plan: Uncertain at this time  Consultants:     Procedures:     Antimicrobials: Anti-infectives (From admission, onward)   Start     Dose/Rate Route Frequency Ordered Stop   05/22/18 2100  vancomycin (VANCOCIN) IVPB 1000 mg/200 mL premix     1,000 mg 200 mL/hr over 60 Minutes Intravenous Every 12 hours 05/22/18 1153     05/22/18 0815  vancomycin (VANCOCIN) 1,500 mg in sodium chloride 0.9 % 500 mL IVPB     1,500 mg 250 mL/hr over 120 Minutes Intravenous STAT 05/22/18 0754 05/22/18 1209   05/21/18 1400  imipenem-cilastatin (PRIMAXIN) 500 mg in sodium chloride 0.9 % 100 mL IVPB     500 mg 200 mL/hr over 30 Minutes Intravenous Every 6 hours 05/21/18 1328     05/20/18 1800  oseltamivir (TAMIFLU) capsule 75 mg     75 mg Oral 2 times daily 05/20/18 1738 05/25/18 2159       Subjective: Complaining of diarrhea  Objective: Vitals:   05/22/18 1835 05/22/18 2117 05/23/18 0500 05/23/18 1153  BP: 106/68 113/71 113/76 119/77  Pulse: 80 62 75 86  Resp: 19 18 19 19   Temp: 99.3 F (37.4 C) 98.5 F (36.9 C) 98.3 F (36.8 C) 98.7 F (37.1 C)  TempSrc: Oral Oral Oral Oral  SpO2: 97% 97% 95% 99%  Weight:      Height:        Intake/Output Summary (Last 24 hours) at 05/23/2018 1444 Last data filed at 05/23/2018 1206 Gross per 24 hour  Intake 4909.76 ml  Output  6050 ml  Net -1140.24 ml   Filed Weights   05/20/18 2031 05/21/18 0617  Weight: 70.8 kg 70.5 kg    Examination:  General exam: Appears calm and comfortable  Respiratory system: Clear to auscultation. Respiratory effort normal. Cardiovascular system: S1 & S2 heard, RRR. Gastrointestinal system: Abdomen is nondistended, soft and nontender. No organomegaly or masses felt. Normal bowel sounds heard. Central nervous system:  Alert and oriented. No focal neurological deficits. Extremities: Symmetric 5 x 5 power. Skin: No rashes, lesions  Psychiatry: Judgement and insight appear normal. Mood & affect appropriate.   Data Reviewed: I have personally reviewed following labs and imaging studies  CBC: Recent Labs  Lab 05/20/18 1357 05/20/18 1923 05/21/18 0052 05/22/18 0508 05/23/18 0413  WBC 4.3  --  7.4 8.5 8.3  NEUTROABS 2.5  --   --   --   --   HGB 14.3  --  10.6* 11.3* 10.0*  HCT 42.6 36.4* 31.5* 35.3* 31.0*  MCV 87.8  --  87.3 90.7 88.3  PLT PLATELET CLUMPS NOTED ON SMEAR, UNABLE TO ESTIMATE  --  90* 124* 181   Basic Metabolic Panel: Recent Labs  Lab 05/20/18 1357 05/21/18 0052 05/22/18 0508 05/22/18 1527 05/23/18 0413  NA 124* 125* 132* 134* 134*  K 3.9 2.8* 2.8* 3.0* 2.8*  CL 81* 88* 95* 98 99  CO2 24 23 25 28 25   GLUCOSE 118* 134* 93 131* 139*  BUN 24* 15 6 <5* <5*  CREATININE 0.82 0.76 0.53* 0.57* 0.57*  CALCIUM 8.1* 7.4* 7.8* 7.9* 7.6*  MG  --  2.0 2.0  --  1.7   GFR: Estimated Creatinine Clearance: 106.9 mL/min (A) (by C-G formula based on SCr of 0.57 mg/dL (L)). Liver Function Tests: Recent Labs  Lab 05/20/18 1357 05/21/18 0052 05/22/18 0508 05/23/18 0413  AST 704* 510* 669* 503*  ALT 128* 94* 110* 100*  ALKPHOS 46 30* 40 39  BILITOT 1.5* 0.9 0.9 0.6  PROT 8.2* 6.2* 6.7 6.1*  ALBUMIN 3.4* 2.6* 2.5* 2.3*   No results for input(s): LIPASE, AMYLASE in the last 168 hours. Recent Labs  Lab 05/20/18 1627  AMMONIA 26   Coagulation Profile: Recent Labs  Lab 05/20/18 1357  INR 0.93   Cardiac Enzymes: Recent Labs  Lab 05/20/18 1357 05/20/18 1923 05/21/18 0052 05/22/18 0508 05/22/18 1527 05/23/18 0413  CKTOTAL 18,367*  --  17,614* 23,740* 17,441* 11,546*  TROPONINI 0.33* 0.30* 0.23*  --   --   --    BNP (last 3 results) No results for input(s): PROBNP in the last 8760 hours. HbA1C: Recent Labs    05/23/18 0952  HGBA1C 6.9*   CBG: Recent Labs  Lab  05/22/18 1225 05/22/18 1637 05/22/18 2116 05/23/18 0736 05/23/18 1150  GLUCAP 150* 146* 138* 329* 106*   Lipid Profile: No results for input(s): CHOL, HDL, LDLCALC, TRIG, CHOLHDL, LDLDIRECT in the last 72 hours. Thyroid Function Tests: No results for input(s): TSH, T4TOTAL, FREET4, T3FREE, THYROIDAB in the last 72 hours. Anemia Panel: Recent Labs    05/20/18 1923  VITAMINB12 349   Sepsis Labs: Recent Labs  Lab 05/20/18 1357 05/20/18 1557  LATICACIDVEN 3.1* 1.9    Recent Results (from the past 240 hour(s))  Urine culture     Status: Abnormal   Collection Time: 05/20/18  2:58 PM  Result Value Ref Range Status   Specimen Description   Final    URINE, RANDOM Performed at [facility], [address] [place], [place]  [id number]    Special Requests   Final    NONE Performed at [facility], [address] [place], [place] [address]    Culture MULTIPLE SPECIES PRESENT, SUGGEST RECOLLECTION (A)  Final   Report Status 05/22/2018 FINAL  Final  MRSA PCR Screening     Status: None   Collection Time: 05/20/18  6:28 PM  Result Value Ref Range Status   MRSA by PCR NEGATIVE NEGATIVE Final    Comment:        The GeneXpert MRSA Assay (FDA approved for NASAL specimens only), is one component of a comprehensive MRSA colonization surveillance program. It is not intended to diagnose MRSA infection nor to guide or monitor treatment for MRSA infections. Performed at [facility], [address] [place], [place] [address]   Culture, blood (Routine X 2) w Reflex to ID Panel     Status: Abnormal   Collection Time: 05/20/18  7:23 PM  Result Value Ref Range Status   Specimen Description   Final    BLOOD BLOOD RIGHT HAND Performed at [facility], [address] [place], [place] [address]    Special Requests   Final    BOTTLES DRAWN AEROBIC ONLY Blood Culture adequate volume Performed at [facility], [address] [place], [place] [address]    Culture  Setup Time   Final    GRAM POSITIVE COCCI GRAM NEGATIVE COCCOBACILLI AEROBIC BOTTLE ONLY CRITICAL RESULT CALLED TO, READ BACK BY AND VERIFIED WITH: PHARMD Kroner, E [phone] FCP    Culture (A)  Final    AEROCOCCUS VIRIDANS ACINETOBACTER CALCOACETICUS/BAUMANNII COMPLEX STAPHYLOCOCCUS SPECIES (COAGULASE NEGATIVE) THE SIGNIFICANCE OF ISOLATING THIS ORGANISM FROM A SINGLE SET OF BLOOD CULTURES WHEN MULTIPLE SETS ARE DRAWN IS UNCERTAIN. PLEASE NOTIFY THE MICROBIOLOGY DEPARTMENT WITHIN ONE WEEK IF SPECIATION AND SENSITIVITIES ARE REQUIRED. Performed at [facility], [address] [place], [place] [address]    Report Status 05/23/2018 FINAL  Final   Organism ID, Bacteria ACINETOBACTER CALCOACETICUS/BAUMANNII COMPLEX  Final      Susceptibility   Acinetobacter calcoaceticus/baumannii complex - MIC*    CEFTAZIDIME 4 SENSITIVE Sensitive     CEFTRIAXONE 16 INTERMEDIATE Intermediate     CIPROFLOXACIN <=0.25 SENSITIVE Sensitive     GENTAMICIN <=1 SENSITIVE Sensitive     IMIPENEM <=0.25 SENSITIVE Sensitive     PIP/TAZO 8 SENSITIVE Sensitive     TRIMETH/SULFA <=20 SENSITIVE Sensitive     CEFEPIME 2 SENSITIVE Sensitive     AMPICILLIN/SULBACTAM <=2 SENSITIVE Sensitive     * ACINETOBACTER CALCOACETICUS/BAUMANNII COMPLEX  Culture, blood (Routine X 2) w Reflex to ID Panel     Status: None (Preliminary result)   Collection Time: 05/20/18  7:23 PM  Result Value Ref Range Status   Specimen Description   Final    BLOOD RIGHT ANTECUBITAL Performed at [facility], [address] [place], [place] [address]    Special Requests   Final    BOTTLES DRAWN AEROBIC ONLY Blood Culture adequate volume Performed at [facility], [address] [place], [place] [address]    Culture   Final    NO GROWTH 3 DAYS Performed at [facility], [address] [place], [place] [address]     Report Status PENDING  Incomplete  Blood Culture ID Panel (Reflexed)     Status: Abnormal   Collection Time: 05/20/18  7:23 PM  Result Value Ref Range Status  Enterococcus species NOT DETECTED NOT DETECTED Final   Listeria monocytogenes NOT DETECTED NOT DETECTED Final   Staphylococcus species DETECTED (A) NOT DETECTED Final    Comment: Methicillin (oxacillin) resistant coagulase negative staphylococcus. Possible blood culture contaminant (unless isolated from more than one blood culture draw or clinical case suggests pathogenicity). No antibiotic treatment is indicated for blood  culture contaminants. CRITICAL RESULT CALLED TO, READ BACK BY AND VERIFIED WITH: PHARMD Brokaw, E [phone] FCP    Staphylococcus aureus (BCID) NOT DETECTED NOT DETECTED Final   Methicillin resistance DETECTED (A) NOT DETECTED Final    Comment: CRITICAL RESULT CALLED TO, READ BACK BY AND VERIFIED WITH: PHARMD Dirk, E [phone] FCP    Streptococcus species NOT DETECTED NOT DETECTED Final   Streptococcus agalactiae NOT DETECTED NOT DETECTED Final   Streptococcus pneumoniae NOT DETECTED NOT DETECTED Final   Streptococcus pyogenes NOT DETECTED NOT DETECTED Final   Acinetobacter baumannii DETECTED (A) NOT DETECTED Final    Comment: CRITICAL RESULT CALLED TO, READ BACK BY AND VERIFIED WITH: PHARMD Novakovich, E [phone] FCP    Enterobacteriaceae species NOT DETECTED NOT DETECTED Final   Enterobacter cloacae complex NOT DETECTED NOT DETECTED Final   Escherichia coli NOT DETECTED NOT DETECTED Final   Klebsiella oxytoca NOT DETECTED NOT DETECTED Final   Klebsiella pneumoniae NOT DETECTED NOT DETECTED Final   Proteus species NOT DETECTED NOT DETECTED Final   Serratia marcescens NOT DETECTED NOT DETECTED Final   Carbapenem resistance NOT DETECTED NOT DETECTED Final   Haemophilus influenzae NOT DETECTED NOT DETECTED Final   Neisseria meningitidis NOT DETECTED NOT DETECTED Final   Pseudomonas aeruginosa NOT  DETECTED NOT DETECTED Final   Candida albicans NOT DETECTED NOT DETECTED Final   Candida glabrata NOT DETECTED NOT DETECTED Final   Candida krusei NOT DETECTED NOT DETECTED Final   Candida parapsilosis NOT DETECTED NOT DETECTED Final   Candida tropicalis NOT DETECTED NOT DETECTED Final  Culture, blood (Routine X 2) w Reflex to ID Panel     Status: None (Preliminary result)   Collection Time: 05/22/18  3:27 PM  Result Value Ref Range Status   Specimen Description   Final    BLOOD LEFT ANTECUBITAL Performed at [facility], [address] [place], [place] [address]    Special Requests   Final    BOTTLES DRAWN AEROBIC AND ANAEROBIC Blood Culture adequate volume Performed at [facility], [address] [place], [place] [address]    Culture   Final    NO GROWTH < 12 HOURS Performed at [facility], [address] [place], [place] [address]    Report Status PENDING  Incomplete  Culture, blood (Routine X 2) w Reflex to ID Panel     Status: None (Preliminary result)   Collection Time: 05/22/18  3:28 PM  Result Value Ref Range Status   Specimen Description   Final    BLOOD LEFT HAND Performed at [facility], [address] [place], [place] [address]    Special Requests   Final    BOTTLES DRAWN AEROBIC ONLY Blood Culture adequate volume Performed at [facility], [address] [place], [place] [address]    Culture   Final    NO GROWTH < 12 HOURS Performed at [facility], [address] [place], [place] [address]    Report Status PENDING  Incomplete     Radiology Studies: No results found.  Scheduled Meds: .  enoxaparin (LOVENOX) injection  40 mg Subcutaneous Q24H  . folic acid  1 mg Oral Daily  . hydrocerin   Topical Daily  . insulin aspart  0-15 Units Subcutaneous TID WC  . LORazepam  0-4 mg Intravenous Q12H   Or  . LORazepam  0-4 mg Oral Q12H  . multivitamin with minerals  1  tablet Oral Daily  . oseltamivir  75 mg Oral BID  . potassium chloride  40 mEq Oral BID  . thiamine  100 mg Oral Daily   Or  . thiamine  100 mg Intravenous Daily   Continuous Infusions: . sodium chloride 150 mL/hr at 05/23/18 1204  . imipenem-cilastatin 500 mg (05/23/18 1154)  . vancomycin 1,000 mg (05/23/18 0930)     LOS: 3 days   Rickey Barbara, MD [facility] Pager On Amion  If 7PM-7AM, please contact night-coverage 05/23/2018, 2:44 PM

## 2018-05-23 NOTE — Progress Notes (Signed)
Xeroform guaze applied to scrotal and perineum area. Pt tol well. SRP, RN

## 2018-05-24 ENCOUNTER — Inpatient Hospital Stay (HOSPITAL_COMMUNITY): Payer: Self-pay

## 2018-05-24 DIAGNOSIS — R7881 Bacteremia: Secondary | ICD-10-CM

## 2018-05-24 LAB — COMPREHENSIVE METABOLIC PANEL
ALT: 99 U/L — ABNORMAL HIGH (ref 0–44)
ANION GAP: 6 (ref 5–15)
AST: 352 U/L — ABNORMAL HIGH (ref 15–41)
Albumin: 2.5 g/dL — ABNORMAL LOW (ref 3.5–5.0)
Alkaline Phosphatase: 48 U/L (ref 38–126)
BUN: 7 mg/dL (ref 6–20)
CO2: 26 mmol/L (ref 22–32)
Calcium: 8.1 mg/dL — ABNORMAL LOW (ref 8.9–10.3)
Chloride: 103 mmol/L (ref 98–111)
Creatinine, Ser: 0.55 mg/dL — ABNORMAL LOW (ref 0.61–1.24)
GFR calc Af Amer: >60 mL/min (ref >60)
GFR calc non Af Amer: >60 mL/min (ref >60)
GLUCOSE: 115 mg/dL — AB (ref 70–99)
Potassium: 3.7 mmol/L (ref 3.5–5.1)
Sodium: 135 mmol/L (ref 135–145)
Total Bilirubin: 0.8 mg/dL (ref 0.3–1.2)
Total Protein: 6.9 g/dL (ref 6.5–8.1)

## 2018-05-24 LAB — CBC
HCT: 32.6 % — ABNORMAL LOW (ref 39.0–52.0)
Hemoglobin: 10.5 g/dL — ABNORMAL LOW (ref 13.0–17.0)
MCH: 29.2 pg (ref 26.0–34.0)
MCHC: 32.2 g/dL (ref 30.0–36.0)
MCV: 90.6 fL (ref 80.0–100.0)
PLATELETS: 255 10*3/uL (ref 150–400)
RBC: 3.6 MIL/uL — ABNORMAL LOW (ref 4.22–5.81)
RDW: 15.4 % (ref 11.5–15.5)
WBC: 10.1 10*3/uL (ref 4.0–10.5)
nRBC: 0 % (ref 0.0–0.2)

## 2018-05-24 LAB — ECHOCARDIOGRAM COMPLETE
Height: 68 in
Weight: 2486.79 oz

## 2018-05-24 LAB — GLUCOSE, CAPILLARY
Glucose-Capillary: 147 mg/dL — ABNORMAL HIGH (ref 70–99)
Glucose-Capillary: 174 mg/dL — ABNORMAL HIGH (ref 70–99)
Glucose-Capillary: 142 mg/dL — ABNORMAL HIGH (ref 70–99)
Glucose-Capillary: 149 mg/dL — ABNORMAL HIGH (ref 70–99)

## 2018-05-24 LAB — CK: Total CK: 6266 U/L — ABNORMAL HIGH (ref 49–397)

## 2018-05-24 LAB — MAGNESIUM: Magnesium: 1.9 mg/dL (ref 1.7–2.4)

## 2018-05-24 MED ORDER — TRAZODONE HCL 100 MG PO TABS
100.0000 mg | ORAL_TABLET | Freq: Every evening | ORAL | Status: DC | PRN
  Administered 2018-05-25 – 2018-05-27 (×3): 100 mg via ORAL
  Filled 2018-05-24 (×3): qty 1

## 2018-05-24 MED ORDER — PREDNISONE 20 MG PO TABS
40.0000 mg | ORAL_TABLET | Freq: Every day | ORAL | Status: DC
  Administered 2018-05-24 – 2018-05-28 (×5): 40 mg via ORAL
  Filled 2018-05-24 (×5): qty 2

## 2018-05-24 MED ORDER — PREDNISONE 10 MG PO TABS: 10.0000 mg | ORAL_TABLET | Freq: Every day | ORAL | Status: DC

## 2018-05-24 MED ORDER — POTASSIUM CHLORIDE CRYS ER 20 MEQ PO TBCR
40.0000 meq | EXTENDED_RELEASE_TABLET | Freq: Once | ORAL | Status: AC
  Administered 2018-05-24: 40 meq via ORAL
  Filled 2018-05-24: qty 2

## 2018-05-24 NOTE — Evaluation (Signed)
Occupational Therapy Evaluation Patient Details Name: Jake AlbeeDwight L Hopes MRN: [id number] DOB: 08/06/1965 Today's Date: 05/24/2018    History of Present Illness 53 y.o. male with medical history significant of alcohol abuse but otherwise limited medical history was brought to the hospital for evaluation of change in mental status and unresponsiveness likely secondary to alcohol intoxication.   Pt fournd to have acute rhabdomyolysis, Bacteremia-Acinetobacter, and influenza A positive.   Clinical Impression   Pt admitted with the above diagnoses and presents with below problem list. Pt will benefit from continued acute OT to address the below listed deficits and maximize independence with basic ADLs prior to d/c below. PTA pt was independent with ADLs, does endorses h/o recent falls. Pt is currently min A for LB ADLs and functional mobility.      Follow Up Recommendations  SNF    Equipment Recommendations  Other (comment)(defer to next venue)    Recommendations for Other Services       Precautions / Restrictions Precautions Precautions: Fall Restrictions Weight Bearing Restrictions: No      Mobility Bed Mobility Overal bed mobility: Modified Independent             General bed mobility comments: up in chair  Transfers Overall transfer level: Needs assistance Equipment used: Rolling walker (2 wheeled) Transfers: Sit to/from Stand Sit to Stand: Min assist         General transfer comment: assist for steadying with rise, cues for hand placement    Balance Overall balance assessment: History of Falls;Needs assistance         Standing balance support: Bilateral upper extremity supported Standing balance-Leahy Scale: Poor                             ADL either performed or assessed with clinical judgement   ADL Overall ADL's : Needs assistance/impaired Eating/Feeding: Set up;Sitting   Grooming: Set up;Sitting   Upper Body Bathing: Set up;Sitting    Lower Body Bathing: Minimal assistance;Sit to/from stand   Upper Body Dressing : Set up;Sitting   Lower Body Dressing: Minimal assistance;Sit to/from stand   Toilet Transfer: Minimal assistance;Ambulation;RW   Toileting- Clothing Manipulation and Hygiene: Minimal assistance;Sit to/from stand   Tub/ Shower Transfer: Minimal assistance   Functional mobility during ADLs: Minimal assistance;Rolling walker General ADL Comments: min A for LB ADLs and functional transfers/mobility due to balance deficits.     Vision         Perception     Praxis      Pertinent Vitals/Pain Pain Assessment: 0-10 Pain Score: 5  Pain Location: bil feet Pain Descriptors / Indicators: Discomfort Pain Intervention(s): Monitored during session     Hand Dominance     Extremity/Trunk Assessment Upper Extremity Assessment Upper Extremity Assessment: RUE deficits/detail;Overall [facility] for tasks assessed;Generalized weakness RUE Deficits / Details: reports some mild numbness and weakness throughout RUE. WFL.   Lower Extremity Assessment Lower Extremity Assessment: Defer to PT evaluation       Communication Communication Communication: No difficulties   Cognition Arousal/Alertness: Awake/alert Behavior During Therapy: WFL for tasks assessed/performed Overall Cognitive Status: Within Functional Limits for tasks assessed                                     General Comments       Exercises     Shoulder Instructions  Home Living Family/patient expects to be discharged to:: Shelter/Homeless                                        Prior Functioning/Environment Level of Independence: Independent        Comments: reports he is on the move a lot on a typical day. endorses h/o recent falls.        OT Problem List: Decreased activity tolerance;Impaired balance (sitting and/or standing);Decreased knowledge of use of DME or AE;Decreased knowledge of  precautions;Pain      OT Treatment/Interventions: Self-care/ADL training;Therapeutic exercise;DME and/or AE instruction;Therapeutic activities;Patient/family education;Balance training    OT Goals(Current goals can be found in the care plan section) Acute Rehab OT Goals Patient Stated Goal: not stated OT Goal Formulation: With patient Time For Goal Achievement: 06/07/18 Potential to Achieve Goals: Good ADL Goals Pt Will Perform Grooming: with modified independence;standing Pt Will Perform Lower Body Bathing: with modified independence;sit to/from stand Pt Will Perform Lower Body Dressing: with modified independence;sit to/from stand Pt Will Transfer to Toilet: with modified independence;ambulating Pt Will Perform Toileting - Clothing Manipulation and hygiene: with modified independence;sit to/from stand Pt Will Perform Tub/Shower Transfer: with modified independence;ambulating  OT Frequency: Min 2X/week   Barriers to D/C: Other (comment)(homeless)          Co-evaluation              AM-PAC OT "6 Clicks" Daily Activity     Outcome Measure Help from another person eating meals?: None Help from another person taking care of personal grooming?: None Help from another person toileting, which includes using toliet, bedpan, or urinal?: A Little Help from another person bathing (including washing, rinsing, drying)?: A Little Help from another person to put on and taking off regular upper body clothing?: A Little Help from another person to put on and taking off regular lower body clothing?: A Little 6 Click Score: 20   End of Session Equipment Utilized During Treatment: Rolling walker  Activity Tolerance: Patient limited by fatigue Patient left: in chair;with call bell/phone within reach;with chair alarm set  OT Visit Diagnosis: Unsteadiness on feet (R26.81);Muscle weakness (generalized) (M62.81);Pain;History of falling (Z91.81);Repeated falls (R29.6)                Time:  4270-6237 OT Time Calculation (min): 10 min Charges:  OT General Charges $OT Visit: 1 Visit OT Evaluation $OT Eval Low Complexity: 1 Low  05/24/2018  Sat: Wynelle Link:  Mon: Tues: Wed: Thurs: Fri:  Seen by:     Pilar Grammes 05/24/2018, 1:51 PM

## 2018-05-24 NOTE — Progress Notes (Signed)
PROGRESS NOTE    Jake AlbeeDwight L Doty  ZOX:[id number] DOB: 02/13/1966 DOA: 05/20/2018 PCP: Patient, No Pcp Per    Brief Narrative:  53 y.o.malewith medical history significant ofalcohol abuse but otherwise limited medical history was brought to the hospital for evaluation of change in mental status and unresponsiveness likely secondary to alcohol intoxication.  History was limited due to his mental status.  Upon admission he was found to be hyponatremic, and rhabdomyolysis with elevated lactate level.  His LFTs were elevated as well.  He was positive for influenza.  He was febrile during the hospitalization and blood cultures ended up growing Acinetobacter and 1 bottle staph aureus with methicillin resistance.  Patient started on imipenem and vancomycin.   Assessment & Plan:   Principal Problem:   Rhabdomyolysis Active Problems:   Hyponatremia   Elevated troponin   Moderate dehydration   Alcohol use   Influenza A   Transaminitis  Acute metabolic encephalopathy secondary to alcohol intoxication, improving Acute rhabdomyolysis, worsening Moderate dehydration with elevated anion gap, resolved -CK trending down with IVF hydration -Recheck CK in AM -CK down to 6k today -Plan to continue aggressive IVF hydration until CK is at least <5,000  Bacteremia-Acinetobacter. -Blood cultures are positive for Acinetobacter.  Also positive for what appears to be MRSA just in 1 bottle.  Due to his fevers overnight, he was started empirically  on vancomycin and imipenem. Discussed with ID. Transitioned to unasyn with eventual plan to transition to orals when stable. Anticipating tx of 5 days of abx -Repeat cultures neg  Hypokalemia -replaced -Will provide 40mEq additional KCl to achieve goal of K of 4.0  Hyponatremia, secondary to hypovolemia -Improved with hydration -Recheck bmet in AM  Elevated troponin -Trended down.  Unlikely ACS.   - Remains chest pain-free at this  time  Transaminitis Alcohol use -LFTs trending down -Alcohol withdrawal protocol in place. Folic acid, thiamine and multivitamins ordered. -Cont to follow LFT's  Influenza A positive -Day 5 Tamiflu.  Supportive care. -Currently stable  Thrombocytopenia -Likely secondary to alcohol use. -Repeat CBC in AM  DVT prophylaxis: Lovenox subQ Code Status: Full Family Communication: Pt in room, family not at bedside Disposition Plan: Uncertain at this time  Consultants:     Procedures:     Antimicrobials: Anti-infectives (From admission, onward)   Start     Dose/Rate Route Frequency Ordered Stop   05/23/18 1800  Ampicillin-Sulbactam (UNASYN) 3 g in sodium chloride 0.9 % 100 mL IVPB     3 g 200 mL/hr over 30 Minutes Intravenous Every 6 hours 05/23/18 1630     05/22/18 2100  vancomycin (VANCOCIN) IVPB 1000 mg/200 mL premix  Status:  Discontinued     1,000 mg 200 mL/hr over 60 Minutes Intravenous Every 12 hours 05/22/18 1153 05/23/18 1630   05/22/18 0815  vancomycin (VANCOCIN) 1,500 mg in sodium chloride 0.9 % 500 mL IVPB     1,500 mg 250 mL/hr over 120 Minutes Intravenous STAT 05/22/18 0754 05/22/18 1209   05/21/18 1400  imipenem-cilastatin (PRIMAXIN) 500 mg in sodium chloride 0.9 % 100 mL IVPB  Status:  Discontinued     500 mg 200 mL/hr over 30 Minutes Intravenous Every 6 hours 05/21/18 1328 05/23/18 1630   05/20/18 1800  oseltamivir (TAMIFLU) capsule 75 mg     75 mg Oral 2 times daily 05/20/18 1738 05/25/18 2159      Subjective: Complaining of trouble sleeping at night  Objective: Vitals:   05/24/18 0452 05/24/18 0704 05/24/18 0900 05/24/18 1400  BP: (!) 148/94 137/87 125/83 137/89  Pulse: 71 68 64 69  Resp: 18 18 14 12   Temp: 98 F (36.7 C)  98.6 F (37 C) 98.1 F (36.7 C)  TempSrc:   Oral Oral  SpO2: 96% 94% 97% 98%  Weight:      Height:        Intake/Output Summary (Last 24 hours) at 05/24/2018 1443 Last data filed at 05/24/2018 1441 Gross per 24 hour   Intake 5946.95 ml  Output 6875 ml  Net -928.05 ml   Filed Weights   05/20/18 2031 05/21/18 0617  Weight: 70.8 kg 70.5 kg    Examination: General exam: Awake, laying in bed, in nad Respiratory system: Normal respiratory effort, no wheezing Cardiovascular system: regular rate, s1, s2 Gastrointestinal system: Soft, nondistended, positive BS Central nervous system: CN2-12 grossly intact, strength intact Extremities: Perfused, no clubbing Skin: Normal skin turgor, no rashes Psychiatry: Mood normal // no visual hallucinations   Data Reviewed: I have personally reviewed following labs and imaging studies  CBC: Recent Labs  Lab 05/20/18 1357 05/20/18 1923 05/21/18 0052 05/22/18 0508 05/23/18 0413 05/24/18 0357  WBC 4.3  --  7.4 8.5 8.3 10.1  NEUTROABS 2.5  --   --   --   --   --   HGB 14.3  --  10.6* 11.3* 10.0* 10.5*  HCT 42.6 36.4* 31.5* 35.3* 31.0* 32.6*  MCV 87.8  --  87.3 90.7 88.3 90.6  PLT PLATELET CLUMPS NOTED ON SMEAR, UNABLE TO ESTIMATE  --  90* 124* 181 255   Basic Metabolic Panel: Recent Labs  Lab 05/21/18 0052 05/22/18 0508 05/22/18 1527 05/23/18 0413 05/24/18 0357  NA 125* 132* 134* 134* 135  K 2.8* 2.8* 3.0* 2.8* 3.7  CL 88* 95* 98 99 103  CO2 23 25 28 25 26   GLUCOSE 134* 93 131* 139* 115*  BUN 15 6 <5* <5* 7  CREATININE 0.76 0.53* 0.57* 0.57* 0.55*  CALCIUM 7.4* 7.8* 7.9* 7.6* 8.1*  MG 2.0 2.0  --  1.7 1.9   GFR: Estimated Creatinine Clearance: 106.9 mL/min (A) (by C-G formula based on SCr of 0.55 mg/dL (L)). Liver Function Tests: Recent Labs  Lab 05/20/18 1357 05/21/18 0052 05/22/18 0508 05/23/18 0413 05/24/18 0357  AST 704* 510* 669* 503* 352*  ALT 128* 94* 110* 100* 99*  ALKPHOS 46 30* 40 39 48  BILITOT 1.5* 0.9 0.9 0.6 0.8  PROT 8.2* 6.2* 6.7 6.1* 6.9  ALBUMIN 3.4* 2.6* 2.5* 2.3* 2.5*   No results for input(s): LIPASE, AMYLASE in the last 168 hours. Recent Labs  Lab 05/20/18 1627  AMMONIA 26   Coagulation Profile: Recent  Labs  Lab 05/20/18 1357  INR 0.93   Cardiac Enzymes: Recent Labs  Lab 05/20/18 1357 05/20/18 1923 05/21/18 0052 05/22/18 0508 05/22/18 1527 05/23/18 0413 05/24/18 0357  CKTOTAL 18,367*  --  17,614* 23,740* 17,441* 11,546* 6,266*  TROPONINI 0.33* 0.30* 0.23*  --   --   --   --    BNP (last 3 results) No results for input(s): PROBNP in the last 8760 hours. HbA1C: Recent Labs    05/23/18 0952  HGBA1C 6.9*   CBG: Recent Labs  Lab 05/23/18 1150 05/23/18 1706 05/23/18 2113 05/24/18 0839 05/24/18 1120  GLUCAP 106* 197* 175* 147* 174*   Lipid Profile: No results for input(s): CHOL, HDL, LDLCALC, TRIG, CHOLHDL, LDLDIRECT in the last 72 hours. Thyroid Function Tests: No results for input(s): TSH, T4TOTAL, FREET4, T3FREE, THYROIDAB in the last  72 hours. Anemia Panel: No results for input(s): VITAMINB12, FOLATE, FERRITIN, TIBC, IRON, RETICCTPCT in the last 72 hours. Sepsis Labs: Recent Labs  Lab 05/20/18 1357 05/20/18 1557  LATICACIDVEN 3.1* 1.9    Recent Results (from the past 240 hour(s))  Urine culture     Status: Abnormal   Collection Time: 05/20/18  2:58 PM  Result Value Ref Range Status   Specimen Description   Final    URINE, RANDOM Performed at [facility], [address], [place], [place] [address]    Special Requests   Final    NONE Performed at [facility], [address], [place], [place] [address]    Culture MULTIPLE SPECIES PRESENT, SUGGEST RECOLLECTION (A)  Final   Report Status 05/22/2018 FINAL  Final  MRSA PCR Screening     Status: None   Collection Time: 05/20/18  6:28 PM  Result Value Ref Range Status   MRSA by PCR NEGATIVE NEGATIVE Final    Comment:        The GeneXpert MRSA Assay (FDA approved for NASAL specimens only), is one component of a comprehensive MRSA colonization surveillance program. It is not intended to diagnose MRSA infection nor to guide or monitor treatment for MRSA  infections. Performed at [facility], [address], [place], [place] [address]   Culture, blood (Routine X 2) w Reflex to ID Panel     Status: Abnormal   Collection Time: 05/20/18  7:23 PM  Result Value Ref Range Status   Specimen Description   Final    BLOOD BLOOD RIGHT HAND Performed at [facility], [address], [place], [place] [address]    Special Requests   Final    BOTTLES DRAWN AEROBIC ONLY Blood Culture adequate volume Performed at [facility], [address], [place], [place] [address]    Culture  Setup Time   Final    GRAM POSITIVE COCCI GRAM NEGATIVE COCCOBACILLI AEROBIC BOTTLE ONLY CRITICAL RESULT CALLED TO, READ BACK BY AND VERIFIED WITH: PHARMD Spake, [phone] FCP    Culture (A)  Final    AEROCOCCUS VIRIDANS ACINETOBACTER CALCOACETICUS/BAUMANNII COMPLEX STAPHYLOCOCCUS SPECIES (COAGULASE NEGATIVE) THE SIGNIFICANCE OF ISOLATING THIS ORGANISM FROM A SINGLE SET OF BLOOD CULTURES WHEN MULTIPLE SETS ARE DRAWN IS UNCERTAIN. PLEASE NOTIFY THE MICROBIOLOGY DEPARTMENT WITHIN ONE WEEK IF SPECIATION AND SENSITIVITIES ARE REQUIRED. Performed at [facility], [address] [place], [place] [address]    Report Status 05/23/2018 FINAL  Final   Organism ID, Bacteria ACINETOBACTER CALCOACETICUS/BAUMANNII COMPLEX  Final      Susceptibility   Acinetobacter calcoaceticus/baumannii complex - MIC*    CEFTAZIDIME 4 SENSITIVE Sensitive     CEFTRIAXONE 16 INTERMEDIATE Intermediate     CIPROFLOXACIN <=0.25 SENSITIVE Sensitive     GENTAMICIN <=1 SENSITIVE Sensitive     IMIPENEM <=0.25 SENSITIVE Sensitive     PIP/TAZO 8 SENSITIVE Sensitive     TRIMETH/SULFA <=20 SENSITIVE Sensitive     CEFEPIME 2 SENSITIVE Sensitive     AMPICILLIN/SULBACTAM <=2 SENSITIVE Sensitive     * ACINETOBACTER CALCOACETICUS/BAUMANNII COMPLEX  Culture, blood (Routine X 2) w Reflex to ID Panel     Status: None (Preliminary result)    Collection Time: 05/20/18  7:23 PM  Result Value Ref Range Status   Specimen Description   Final    BLOOD RIGHT ANTECUBITAL Performed at [facility], [address] [place], [place] [address]    Special  Requests   Final    BOTTLES DRAWN AEROBIC ONLY Blood Culture adequate volume Performed at [facility], [address] [place], [place] [address]    Culture   Final    NO GROWTH 4 DAYS Performed at [facility], [address] [place], [place] [address]    Report Status PENDING  Incomplete  Blood Culture ID Panel (Reflexed)     Status: Abnormal   Collection Time: 05/20/18  7:23 PM  Result Value Ref Range Status   Enterococcus species NOT DETECTED NOT DETECTED Final   Listeria monocytogenes NOT DETECTED NOT DETECTED Final   Staphylococcus species DETECTED (A) NOT DETECTED Final    Comment: Methicillin (oxacillin) resistant coagulase negative staphylococcus. Possible blood culture contaminant (unless isolated from more than one blood culture draw or clinical case suggests pathogenicity). No antibiotic treatment is indicated for blood  culture contaminants. CRITICAL RESULT CALLED TO, READ BACK BY AND VERIFIED WITH: PHARMD Privett, E [phone] FCP    Staphylococcus aureus (BCID) NOT DETECTED NOT DETECTED Final   Methicillin resistance DETECTED (A) NOT DETECTED Final    Comment: CRITICAL RESULT CALLED TO, READ BACK BY AND VERIFIED WITH: PHARMD Vary, E [phone] FCP    Streptococcus species NOT DETECTED NOT DETECTED Final   Streptococcus agalactiae NOT DETECTED NOT DETECTED Final   Streptococcus pneumoniae NOT DETECTED NOT DETECTED Final   Streptococcus pyogenes NOT DETECTED NOT DETECTED Final   Acinetobacter baumannii DETECTED (A) NOT DETECTED Final    Comment: CRITICAL RESULT CALLED TO, READ BACK BY AND VERIFIED WITH: PHARMD Mcsorley, E [phone] FCP    Enterobacteriaceae species NOT DETECTED NOT DETECTED Final   Enterobacter  cloacae complex NOT DETECTED NOT DETECTED Final   Escherichia coli NOT DETECTED NOT DETECTED Final   Klebsiella oxytoca NOT DETECTED NOT DETECTED Final   Klebsiella pneumoniae NOT DETECTED NOT DETECTED Final   Proteus species NOT DETECTED NOT DETECTED Final   Serratia marcescens NOT DETECTED NOT DETECTED Final   Carbapenem resistance NOT DETECTED NOT DETECTED Final   Haemophilus influenzae NOT DETECTED NOT DETECTED Final   Neisseria meningitidis NOT DETECTED NOT DETECTED Final   Pseudomonas aeruginosa NOT DETECTED NOT DETECTED Final   Candida albicans NOT DETECTED NOT DETECTED Final   Candida glabrata NOT DETECTED NOT DETECTED Final   Candida krusei NOT DETECTED NOT DETECTED Final   Candida parapsilosis NOT DETECTED NOT DETECTED Final   Candida tropicalis NOT DETECTED NOT DETECTED Final  Culture, blood (Routine X 2) w Reflex to ID Panel     Status: None (Preliminary result)   Collection Time: 05/22/18  3:27 PM  Result Value Ref Range Status   Specimen Description   Final    BLOOD LEFT ANTECUBITAL Performed at [facility], [address] [place], [place] [address]    Special Requests   Final    BOTTLES DRAWN AEROBIC AND ANAEROBIC Blood Culture adequate volume Performed at [facility], [address] [place], [place] [address]    Culture   Final    NO GROWTH 2 DAYS Performed at [facility], [address] [place], [place] [address]    Report Status PENDING  Incomplete  Culture, blood (Routine X 2) w Reflex to ID Panel     Status: None (Preliminary result)   Collection Time: 05/22/18  3:28 PM  Result Value Ref Range Status   Specimen Description   Final    BLOOD LEFT HAND Performed at [facility], [address], [place], [place] [address]    Special Requests   Final    BOTTLES DRAWN AEROBIC ONLY Blood Culture adequate volume Performed at [facility], [address], [place], [place]  [address]    Culture   Final    NO GROWTH 2 DAYS Performed at [facility], [address], [place], [place] [address]    Report Status PENDING  Incomplete     Radiology Studies: No results found.  Scheduled Meds: . enoxaparin (LOVENOX) injection  40 mg Subcutaneous Q24H  . folic acid  1 mg Oral Daily  . hydrocerin   Topical Daily  . insulin aspart  0-15 Units Subcutaneous TID WC  . LORazepam  0-4 mg Intravenous Q12H   Or  . LORazepam  0-4 mg Oral Q12H  . multivitamin with minerals  1 tablet Oral Daily  . oseltamivir  75 mg Oral BID  . predniSONE  40 mg Oral Q breakfast  . thiamine  100 mg Oral Daily   Or  . thiamine  100 mg Intravenous Daily   Continuous Infusions: . sodium chloride 150 mL/hr at 05/24/18 0727  . ampicillin-sulbactam (UNASYN) IV 3 g (05/24/18 1153)     LOS: 4 days   Rickey Barbara, MD [facility] Pager On Amion  If 7PM-7AM, please contact night-coverage 05/24/2018, 2:43 PM

## 2018-05-24 NOTE — Evaluation (Signed)
Physical Therapy Evaluation Patient Details Name: Jake Nguyen MRN: [id number] DOB: 1966-04-06 Today's Date: 05/24/2018   History of Present Illness  53 y.o. male with medical history significant of alcohol abuse but otherwise limited medical history was brought to the hospital for evaluation of change in mental status and unresponsiveness likely secondary to alcohol intoxication.   Pt fournd to have acute rhabdomyolysis, Bacteremia-Acinetobacter, and influenza A positive.  Clinical Impression  Pt admitted with above diagnosis. Pt currently with functional limitations due to the deficits listed below (see PT Problem List).  Pt will benefit from skilled PT to increase their independence and safety with mobility to allow discharge to the venue listed below.  Pt very unsteady initially however improved with ambulation distance.  Pt requiring RW at this time.  Pt presents as high fall risk.  Recommend d/c to SNF if possible.     Follow Up Recommendations SNF    Equipment Recommendations  Rolling walker with 5" wheels    Recommendations for Other Services       Precautions / Restrictions Precautions Precautions: Fall Restrictions Weight Bearing Restrictions: No      Mobility  Bed Mobility Overal bed mobility: Modified Independent                Transfers Overall transfer level: Needs assistance Equipment used: Rolling walker (2 wheeled) Transfers: Sit to/from Stand Sit to Stand: Min assist         General transfer comment: assist for steadying with rise, cues for hand placement  Ambulation/Gait Ambulation/Gait assistance: Min guard;Min assist Gait Distance (Feet): 60 Feet Assistive device: Rolling walker (2 wheeled) Gait Pattern/deviations: Decreased stride length;Narrow base of support;Trunk flexed     General Gait Details: verbal cues for RW position and step length, pt requiring min assist initially for unsteadiness however improved, student nurse following  with recliner for safety, HR elevated to 137 bpm so had pt sit in recliner, SPO2 93% on room air  Stairs            Wheelchair Mobility    Modified Rankin (Stroke Patients Only)       Balance Overall balance assessment: History of Falls;Needs assistance         Standing balance support: Bilateral upper extremity supported Standing balance-Leahy Scale: Poor                               Pertinent Vitals/Pain Pain Assessment: 0-10 Pain Score: 5  Pain Location: bil feet Pain Descriptors / Indicators: Discomfort Pain Intervention(s): Limited activity within patient's tolerance;Monitored during session    Home Living Family/patient expects to be discharged to:: Shelter/Homeless                      Prior Function Level of Independence: Independent               Hand Dominance        Extremity/Trunk Assessment        Lower Extremity Assessment Lower Extremity Assessment: Generalized weakness       Communication   Communication: No difficulties  Cognition Arousal/Alertness: Awake/alert Behavior During Therapy: WFL for tasks assessed/performed Overall Cognitive Status: Within Functional Limits for tasks assessed                                        General  Comments      Exercises     Assessment/Plan    PT Assessment Patient needs continued PT services  PT Problem List Decreased strength;Decreased mobility;Decreased safety awareness;Decreased activity tolerance;Decreased balance;Decreased knowledge of use of DME;Decreased coordination       PT Treatment Interventions DME instruction;Gait training;Therapeutic activities;Stair training;Therapeutic exercise;Balance training;Functional mobility training;Patient/family education    PT Goals (Current goals can be found in the Care Plan section)  Acute Rehab PT Goals PT Goal Formulation: With patient Time For Goal Achievement: 06/07/18 Potential to Achieve  Goals: Good    Frequency Min 2X/week   Barriers to discharge        Co-evaluation               AM-PAC PT "6 Clicks" Mobility  Outcome Measure Help needed turning from your back to your side while in a flat bed without using bedrails?: None Help needed moving from lying on your back to sitting on the side of a flat bed without using bedrails?: A Little Help needed moving to and from a bed to a chair (including a wheelchair)?: A Little Help needed standing up from a chair using your arms (e.g., wheelchair or bedside chair)?: A Little Help needed to walk in hospital room?: A Little Help needed climbing 3-5 steps with a railing? : A Lot 6 Click Score: 18    End of Session Equipment Utilized During Treatment: Gait belt Activity Tolerance: Patient tolerated treatment well Patient left: in chair;with call bell/phone within reach;with chair alarm set(with OT)   PT Visit Diagnosis: Other abnormalities of gait and mobility (R26.89)    Time: 1030-1052 PT Time Calculation (min) (ACUTE ONLY): 22 min   Charges:   PT Evaluation $PT Eval Low Complexity: 1 Low         Jake Nguyen, PT, DPT Acute Rehabilitation Services Office: [phone] Pager: [phone]  Sarajane JewsLEMYRE,KATHrine E 05/24/2018, 12:00 PM

## 2018-05-25 DIAGNOSIS — F1010 Alcohol abuse, uncomplicated: Secondary | ICD-10-CM

## 2018-05-25 DIAGNOSIS — L8990 Pressure ulcer of unspecified site, unspecified stage: Secondary | ICD-10-CM

## 2018-05-25 LAB — CULTURE, BLOOD (ROUTINE X 2)
Culture: NO GROWTH
SPECIAL REQUESTS: ADEQUATE

## 2018-05-25 LAB — CBC
HCT: 31.6 % — ABNORMAL LOW (ref 39.0–52.0)
Hemoglobin: 10 g/dL — ABNORMAL LOW (ref 13.0–17.0)
MCH: 28.8 pg (ref 26.0–34.0)
MCHC: 31.6 g/dL (ref 30.0–36.0)
MCV: 91.1 fL (ref 80.0–100.0)
Platelets: 327 10*3/uL (ref 150–400)
RBC: 3.47 MIL/uL — ABNORMAL LOW (ref 4.22–5.81)
RDW: 15.6 % — ABNORMAL HIGH (ref 11.5–15.5)
WBC: 11 10*3/uL — ABNORMAL HIGH (ref 4.0–10.5)
nRBC: 0.2 % (ref 0.0–0.2)

## 2018-05-25 LAB — COMPREHENSIVE METABOLIC PANEL
ALT: 123 U/L — AB (ref 0–44)
AST: 261 U/L — ABNORMAL HIGH (ref 15–41)
Albumin: 2.4 g/dL — ABNORMAL LOW (ref 3.5–5.0)
Alkaline Phosphatase: 56 U/L (ref 38–126)
Anion gap: 5 (ref 5–15)
BUN: 9 mg/dL (ref 6–20)
CO2: 25 mmol/L (ref 22–32)
Calcium: 8.1 mg/dL — ABNORMAL LOW (ref 8.9–10.3)
Chloride: 104 mmol/L (ref 98–111)
Creatinine, Ser: 0.56 mg/dL — ABNORMAL LOW (ref 0.61–1.24)
GFR calc non Af Amer: >60 mL/min (ref >60)
Glucose, Bld: 108 mg/dL — ABNORMAL HIGH (ref 70–99)
Potassium: 3.4 mmol/L — ABNORMAL LOW (ref 3.5–5.1)
Sodium: 134 mmol/L — ABNORMAL LOW (ref 135–145)
Total Bilirubin: 0.5 mg/dL (ref 0.3–1.2)
Total Protein: 6.3 g/dL — ABNORMAL LOW (ref 6.5–8.1)

## 2018-05-25 LAB — GLUCOSE, CAPILLARY
GLUCOSE-CAPILLARY: 151 mg/dL — AB (ref 70–99)
Glucose-Capillary: 99 mg/dL (ref 70–99)

## 2018-05-25 LAB — CK: Total CK: 2852 U/L — ABNORMAL HIGH (ref 49–397)

## 2018-05-25 LAB — MAGNESIUM: Magnesium: 1.6 mg/dL — ABNORMAL LOW (ref 1.7–2.4)

## 2018-05-25 MED ORDER — MAGNESIUM SULFATE 2 GM/50ML IV SOLN
2.0000 g | Freq: Once | INTRAVENOUS | Status: AC
  Administered 2018-05-25: 2 g via INTRAVENOUS
  Filled 2018-05-25: qty 50

## 2018-05-25 MED ORDER — POTASSIUM CHLORIDE CRYS ER 20 MEQ PO TBCR
40.0000 meq | EXTENDED_RELEASE_TABLET | Freq: Once | ORAL | Status: AC
  Administered 2018-05-25: 40 meq via ORAL
  Filled 2018-05-25: qty 2

## 2018-05-25 NOTE — Clinical Social Work Note (Signed)
Clinical Social Work Assessment  Patient Details  Name: Jake Nguyen MRN: [id number] Date of Birth: 05-06-1965  Date of referral:  05/25/18               Reason for consult:  Discharge Planning, Facility Placement                Permission sought to share information with:  Facility Industrial/product designer granted to share information::  Yes, Verbal Permission Granted  Name::      Magic Kuhar   Agency::   SNF  Relationship::    Brother   Contact Information:    [phone]  Housing/Transportation Living arrangements for the past 2 months:  Homeless Source of Information:  Patient Patient Interpreter Needed:  None Criminal Activity/Legal Involvement Pertinent to Current Situation/Hospitalization:  Yes Significant Relationships:    Lives with:    Do you feel safe going back to the place where you live?  Yes Need for family participation in patient care:  Yes (Comment)  Care giving concerns:   53 y.o.malewith medical history significant ofalcohol abuse but otherwise limited medical history was brought to the hospital for evaluation of change in mental status and unresponsiveness likely secondary to alcohol intoxication. History was limited due to his mental status.   Patient homeless. Patient deconditioned and weak,PT recommends SNF placement.   Patient does not have not have payor source.   CSW reached out to CSW for Letter of Guarantee.    Social Worker assessment / plan:  CSW discussed discharge planning with the patient and his brother at bedside. Patient is agreeable to SNF if accepted. CSW reached out to CSW Chiropodist. He has requested the patient be revaluated on Monday to determine if the patient will still need intensive rehab at [facility]. CSW sent clinicals to SNF's that accept LOG's.   CSW will continue to assist the patient discharge.   Plan: To be determine.   Employment status:  Unemployed Health and safety inspector:  Self Pay (Medicaid  Pending) PT Recommendations:  Skilled Nursing Facility Information / Referral to community resources:  Skilled Nursing Facility  Patient/Family's Response to care:  Agreeable to care.  Patient/Family's Understanding of and Emotional Response to Diagnosis, Current Treatment, and Prognosis:  Patient reports understanding his diagnosis. Patient brother willing to help and support the patient during his time of transition from the hospital.   Emotional Assessment Appearance:  Appears older than stated age Attitude/Demeanor/Rapport:    Affect (typically observed):  Accepting Orientation:  Oriented to Self, Oriented to Place, Oriented to  Time, Oriented to Situation Alcohol / Substance use:  Not Applicable Psych involvement (Current and /or in the community):  No (Comment)  Discharge Needs  Concerns to be addressed:  Homelessness, Discharge Planning Concerns Readmission within the last 30 days:  No Current discharge risk:  Homeless, Inadequate Financial Supports, Dependent with Mobility Barriers to Discharge:  Continued Medical Work up, Homeless with medical needs, Inadequate or no insurance, Active Substance Use   Clearance Coots, LCSW 05/25/2018, 4:01 PM

## 2018-05-25 NOTE — Progress Notes (Signed)
Occupational Therapy Treatment Patient Details Name: Jake Nguyen MRN: [id number] DOB: 30-Dec-1965 Today's Date: 05/25/2018    History of present illness 53 y.o. male with medical history significant of alcohol abuse but otherwise limited medical history was brought to the hospital for evaluation of change in mental status and unresponsiveness likely secondary to alcohol intoxication.   Pt fournd to have acute rhabdomyolysis, Bacteremia-Acinetobacter, and influenza A positive.   OT comments  Pt very motivated. Balance improved during the session; the RW and min guard to min A is needed for safety.  Pt fatiques quickly; multiple rest breaks given  Follow Up Recommendations  SNF    Equipment Recommendations  (pt is homeless)    Recommendations for Other Services      Precautions / Restrictions Precautions Precautions: Fall Restrictions Weight Bearing Restrictions: No       Mobility Bed Mobility Overal bed mobility: Modified Independent                Transfers   Equipment used: Rolling walker (2 wheeled)   Sit to Stand: Min assist         General transfer comment: light assistance to stand for balance    Balance                                           ADL either performed or assessed with clinical judgement   ADL       Grooming: Oral care;Min guard;Standing               Lower Body Dressing: Minimal assistance;Sit to/from stand   Toilet Transfer: Minimal assistance;Ambulation;RW(chair)             General ADL Comments: stood at sink, transferred to chair and worked on sit to stand, reaching activities as well as walking across room and reaching for things on tray to reorganize/work on weight shifts:  min guard to min A.  Pt more unbalanced at beginning of sesion but needs min A to stand and min guard to min A for balance during activities. Pt is having difficulty reaching to feet for donning socks.     Vision        Perception     Praxis      Cognition Arousal/Alertness: Awake/alert Behavior During Therapy: WFL for tasks assessed/performed Overall Cognitive Status: Within Functional Limits for tasks assessed                                          Exercises     Shoulder Instructions       General Comments multiple rest breaks offered; pt fatiques easily    Pertinent Vitals/ Pain       Pain Score: 5  Pain Location: bil feet Pain Descriptors / Indicators: Tingling Pain Intervention(s): Limited activity within patient's tolerance;Monitored during session  Home Living                                          Prior Functioning/Environment              Frequency  Min 2X/week        Progress Toward Goals  OT Goals(current goals can  now be found in the care plan section)  Progress towards OT goals: Progressing toward goals     Plan      Co-evaluation                 AM-PAC OT "6 Clicks" Daily Activity     Outcome Measure   Help from another person eating meals?: None Help from another person taking care of personal grooming?: A Little Help from another person toileting, which includes using toliet, bedpan, or urinal?: A Little Help from another person bathing (including washing, rinsing, drying)?: A Little Help from another person to put on and taking off regular upper body clothing?: A Little Help from another person to put on and taking off regular lower body clothing?: A Little 6 Click Score: 19    End of Session    OT Visit Diagnosis: Unsteadiness on feet (R26.81);Muscle weakness (generalized) (M62.81);Pain;History of falling (Z91.81);Repeated falls (R29.6)   Activity Tolerance Patient limited by fatigue   Patient Left in bed;with call bell/phone within reach;with chair alarm set(pt wanted to go back to bed at end of sesion; encouraged cha)   Nurse Communication          Time: 9024-0973 OT Time Calculation  (min): 29 min  Charges: OT General Charges $OT Visit: 1 Visit OT Treatments $Self Care/Home Management : 8-22 mins $Therapeutic Activity: 8-22 mins  Jake Nguyen, OTR/L Acute Rehabilitation Services [phone] [facility] pager [phone] office 05/25/2018   Jake Nguyen 05/25/2018, 12:47 PM

## 2018-05-25 NOTE — Progress Notes (Signed)
PROGRESS NOTE    Jake Nguyen  ZOX:[id number] DOB: 02/08/1966 DOA: 05/20/2018 PCP: Patient, No Pcp Per    Brief Narrative:  52 y.o.malewith medical history significant ofalcohol abuse but otherwise limited medical history was brought to the hospital for evaluation of change in mental status and unresponsiveness likely secondary to alcohol intoxication.  History was limited due to his mental status.  Upon admission he was found to be hyponatremic, and rhabdomyolysis with elevated lactate level.  His LFTs were elevated as well.  He was positive for influenza.  He was febrile during the hospitalization and blood cultures ended up growing Acinetobacter and 1 bottle staph aureus with methicillin resistance.  Patient started on imipenem and vancomycin.   Assessment & Plan:   Principal Problem:   Rhabdomyolysis Active Problems:   Hyponatremia   Elevated troponin   Moderate dehydration   Alcohol use   Influenza A   Transaminitis   Pressure injury of skin  Acute metabolic encephalopathy secondary to alcohol intoxication, improving Acute rhabdomyolysis, worsening Moderate dehydration with elevated anion gap, resolved -CK has trended down with IVF hydration -Recheck CK in AM -CK down to 2k today  Bacteremia-Acinetobacter. -Blood cultures are positive for Acinetobacter.  Also positive for what appears to be MRSA just in 1 bottle.  Due to his fevers overnight, he was started empirically  on vancomycin and imipenem. Discussed with ID. Transitioned to unasyn with eventual plan to complete 5 days of tx -Repeat cultures noted to be neg   Hypokalemia -replaced -repeat BMET in AM  Hyponatremia, secondary to hypovolemia -Improved with hydration -Recheck bmet in AM  Elevated troponin -Trended down.  Unlikely ACS.   - Noted to be chest pain free  Transaminitis Alcohol use -LFTs trending down -Alcohol withdrawal protocol in place. Folic acid, thiamine and multivitamins  ordered. -Repeat LFT  Influenza A positive -Day 5 Tamiflu.  Supportive care. -Remains stable  Thrombocytopenia -Likely secondary to alcohol use. -Recheck CBC in AM  DVT prophylaxis: Lovenox subQ Code Status: Full Family Communication: Pt in room, family not at bedside Disposition Plan: Uncertain at this time  Consultants:     Procedures:     Antimicrobials: Anti-infectives (From admission, onward)   Start     Dose/Rate Route Frequency Ordered Stop   05/23/18 1800  Ampicillin-Sulbactam (UNASYN) 3 g in sodium chloride 0.9 % 100 mL IVPB     3 g 200 mL/hr over 30 Minutes Intravenous Every 6 hours 05/23/18 1630     05/22/18 2100  vancomycin (VANCOCIN) IVPB 1000 mg/200 mL premix  Status:  Discontinued     1,000 mg 200 mL/hr over 60 Minutes Intravenous Every 12 hours 05/22/18 1153 05/23/18 1630   05/22/18 0815  vancomycin (VANCOCIN) 1,500 mg in sodium chloride 0.9 % 500 mL IVPB     1,500 mg 250 mL/hr over 120 Minutes Intravenous STAT 05/22/18 0754 05/22/18 1209   05/21/18 1400  imipenem-cilastatin (PRIMAXIN) 500 mg in sodium chloride 0.9 % 100 mL IVPB  Status:  Discontinued     500 mg 200 mL/hr over 30 Minutes Intravenous Every 6 hours 05/21/18 1328 05/23/18 1630   05/20/18 1800  oseltamivir (TAMIFLU) capsule 75 mg     75 mg Oral 2 times daily 05/20/18 1738 05/25/18 0927      Subjective: Didn't receive trazadone overnight. Asking about sleep medication for tonight  Objective: Vitals:   05/24/18 2347 05/25/18 0549 05/25/18 0550 05/25/18 1152  BP: (!) 142/90 (!) 153/95  (!) 145/91  Pulse: 64 63  Marland Kitchen)  59  Resp: 18 18  16   Temp: 99.1 F (37.3 C) 98.9 F (37.2 C)  98.9 F (37.2 C)  TempSrc: Oral Oral  Oral  SpO2: 97% 94%  100%  Weight:   73.3 kg   Height:        Intake/Output Summary (Last 24 hours) at 05/25/2018 1514 Last data filed at 05/25/2018 1451 Gross per 24 hour  Intake 4330.21 ml  Output 6250 ml  Net -1919.79 ml   Filed Weights   05/20/18 2031  05/21/18 0617 05/25/18 0550  Weight: 70.8 kg 70.5 kg 73.3 kg    Examination: General exam: Conversant, in no acute distress Respiratory system: normal chest rise, clear, no audible wheezing Cardiovascular system: regular rhythm, s1-s2 Gastrointestinal system: Nondistended, nontender, pos BS Central nervous system: No seizures, no tremors Extremities: No cyanosis, no joint deformities Skin: No rashes, no pallor Psychiatry: Affect normal // no auditory hallucinations   Data Reviewed: I have personally reviewed following labs and imaging studies  CBC: Recent Labs  Lab 05/20/18 1357  05/21/18 0052 05/22/18 0508 05/23/18 0413 05/24/18 0357 05/25/18 0357  WBC 4.3  --  7.4 8.5 8.3 10.1 11.0*  NEUTROABS 2.5  --   --   --   --   --   --   HGB 14.3  --  10.6* 11.3* 10.0* 10.5* 10.0*  HCT 42.6   < > 31.5* 35.3* 31.0* 32.6* 31.6*  MCV 87.8  --  87.3 90.7 88.3 90.6 91.1  PLT PLATELET CLUMPS NOTED ON SMEAR, UNABLE TO ESTIMATE  --  90* 124* 181 255 327   < > = values in this interval not displayed.   Basic Metabolic Panel: Recent Labs  Lab 05/21/18 0052 05/22/18 0508 05/22/18 1527 05/23/18 0413 05/24/18 0357 05/25/18 0357  NA 125* 132* 134* 134* 135 134*  K 2.8* 2.8* 3.0* 2.8* 3.7 3.4*  CL 88* 95* 98 99 103 104  CO2 23 25 28 25 26 25   GLUCOSE 134* 93 131* 139* 115* 108*  BUN 15 6 <5* <5* 7 9  CREATININE 0.76 0.53* 0.57* 0.57* 0.55* 0.56*  CALCIUM 7.4* 7.8* 7.9* 7.6* 8.1* 8.1*  MG 2.0 2.0  --  1.7 1.9 1.6*   GFR: Estimated Creatinine Clearance: 106.9 mL/min (A) (by C-G formula based on SCr of 0.56 mg/dL (L)). Liver Function Tests: Recent Labs  Lab 05/21/18 0052 05/22/18 0508 05/23/18 0413 05/24/18 0357 05/25/18 0357  AST 510* 669* 503* 352* 261*  ALT 94* 110* 100* 99* 123*  ALKPHOS 30* 40 39 48 56  BILITOT 0.9 0.9 0.6 0.8 0.5  PROT 6.2* 6.7 6.1* 6.9 6.3*  ALBUMIN 2.6* 2.5* 2.3* 2.5* 2.4*   No results for input(s): LIPASE, AMYLASE in the last 168 hours. Recent  Labs  Lab 05/20/18 1627  AMMONIA 26   Coagulation Profile: Recent Labs  Lab 05/20/18 1357  INR 0.93   Cardiac Enzymes: Recent Labs  Lab 05/20/18 1357 05/20/18 1923 05/21/18 0052 05/22/18 0508 05/22/18 1527 05/23/18 0413 05/24/18 0357 05/25/18 0357  CKTOTAL 18,367*  --  17,614* 23,740* 17,441* 11,546* 6,266* 2,852*  TROPONINI 0.33* 0.30* 0.23*  --   --   --   --   --    BNP (last 3 results) No results for input(s): PROBNP in the last 8760 hours. HbA1C: Recent Labs    05/23/18 0952  HGBA1C 6.9*   CBG: Recent Labs  Lab 05/24/18 1120 05/24/18 1614 05/24/18 2256 05/25/18 0740 05/25/18 1148  GLUCAP 174* 142* 149* 99 151*  Lipid Profile: No results for input(s): CHOL, HDL, LDLCALC, TRIG, CHOLHDL, LDLDIRECT in the last 72 hours. Thyroid Function Tests: No results for input(s): TSH, T4TOTAL, FREET4, T3FREE, THYROIDAB in the last 72 hours. Anemia Panel: No results for input(s): VITAMINB12, FOLATE, FERRITIN, TIBC, IRON, RETICCTPCT in the last 72 hours. Sepsis Labs: Recent Labs  Lab 05/20/18 1357 05/20/18 1557  LATICACIDVEN 3.1* 1.9    Recent Results (from the past 240 hour(s))  Urine culture     Status: Abnormal   Collection Time: 05/20/18  2:58 PM  Result Value Ref Range Status   Specimen Description   Final    URINE, RANDOM Performed at [facility], [address] [place], [place] [address]    Special Requests   Final    NONE Performed at [facility], [address] [place], [place] [address]    Culture MULTIPLE SPECIES PRESENT, SUGGEST RECOLLECTION (A)  Final   Report Status 05/22/2018 FINAL  Final  MRSA PCR Screening     Status: None   Collection Time: 05/20/18  6:28 PM  Result Value Ref Range Status   MRSA by PCR NEGATIVE NEGATIVE Final    Comment:        The GeneXpert MRSA Assay (FDA approved for NASAL specimens only), is one component of a comprehensive MRSA colonization surveillance program. It is  not intended to diagnose MRSA infection nor to guide or monitor treatment for MRSA infections. Performed at [facility], [address] [place], [place] [address]   Culture, blood (Routine X 2) w Reflex to ID Panel     Status: Abnormal   Collection Time: 05/20/18  7:23 PM  Result Value Ref Range Status   Specimen Description   Final    BLOOD BLOOD RIGHT HAND Performed at [facility], [address] [place], [place] [address]    Special Requests   Final    BOTTLES DRAWN AEROBIC ONLY Blood Culture adequate volume Performed at [facility], [address] [place], [place] [address]    Culture  Setup Time   Final    GRAM POSITIVE COCCI GRAM NEGATIVE COCCOBACILLI AEROBIC BOTTLE ONLY CRITICAL RESULT CALLED TO, READ BACK BY AND VERIFIED WITH: PHARMD Avakian, E [phone] FCP    Culture (A)  Final    AEROCOCCUS VIRIDANS ACINETOBACTER CALCOACETICUS/BAUMANNII COMPLEX STAPHYLOCOCCUS SPECIES (COAGULASE NEGATIVE) THE SIGNIFICANCE OF ISOLATING THIS ORGANISM FROM A SINGLE SET OF BLOOD CULTURES WHEN MULTIPLE SETS ARE DRAWN IS UNCERTAIN. PLEASE NOTIFY THE MICROBIOLOGY DEPARTMENT WITHIN ONE WEEK IF SPECIATION AND SENSITIVITIES ARE REQUIRED. Performed at [facility], [address] [place], [place] [address]    Report Status 05/23/2018 FINAL  Final   Organism ID, Bacteria ACINETOBACTER CALCOACETICUS/BAUMANNII COMPLEX  Final      Susceptibility   Acinetobacter calcoaceticus/baumannii complex - MIC*    CEFTAZIDIME 4 SENSITIVE Sensitive     CEFTRIAXONE 16 INTERMEDIATE Intermediate     CIPROFLOXACIN <=0.25 SENSITIVE Sensitive     GENTAMICIN <=1 SENSITIVE Sensitive     IMIPENEM <=0.25 SENSITIVE Sensitive     PIP/TAZO 8 SENSITIVE Sensitive     TRIMETH/SULFA <=20 SENSITIVE Sensitive     CEFEPIME 2 SENSITIVE Sensitive     AMPICILLIN/SULBACTAM <=2 SENSITIVE Sensitive     * ACINETOBACTER CALCOACETICUS/BAUMANNII COMPLEX  Culture,  blood (Routine X 2) w Reflex to ID Panel     Status: None   Collection Time: 05/20/18  7:23 PM  Result Value Ref Range Status  Specimen Description   Final    BLOOD RIGHT ANTECUBITAL Performed at [facility], [address], [place], [place] [address]    Special Requests   Final    BOTTLES DRAWN AEROBIC ONLY Blood Culture adequate volume Performed at [facility], [address], [place], [place] [address]    Culture   Final    NO GROWTH 5 DAYS Performed at [facility], [address], [place], [place] [address]    Report Status 05/25/2018 FINAL  Final  Blood Culture ID Panel (Reflexed)     Status: Abnormal   Collection Time: 05/20/18  7:23 PM  Result Value Ref Range Status   Enterococcus species NOT DETECTED NOT DETECTED Final   Listeria monocytogenes NOT DETECTED NOT DETECTED Final   Staphylococcus species DETECTED (A) NOT DETECTED Final    Comment: Methicillin (oxacillin) resistant coagulase negative staphylococcus. Possible blood culture contaminant (unless isolated from more than one blood culture draw or clinical case suggests pathogenicity). No antibiotic treatment is indicated for blood  culture contaminants. CRITICAL RESULT CALLED TO, READ BACK BY AND VERIFIED WITH: PHARMD Karney, E [phone] FCP    Staphylococcus aureus (BCID) NOT DETECTED NOT DETECTED Final   Methicillin resistance DETECTED (A) NOT DETECTED Final    Comment: CRITICAL RESULT CALLED TO, READ BACK BY AND VERIFIED WITH: PHARMD Andes, E [phone] FCP    Streptococcus species NOT DETECTED NOT DETECTED Final   Streptococcus agalactiae NOT DETECTED NOT DETECTED Final   Streptococcus pneumoniae NOT DETECTED NOT DETECTED Final   Streptococcus pyogenes NOT DETECTED NOT DETECTED Final   Acinetobacter baumannii DETECTED (A) NOT DETECTED Final    Comment: CRITICAL RESULT CALLED TO, READ BACK BY AND VERIFIED WITH: PHARMD Bodner, E [phone] FCP     Enterobacteriaceae species NOT DETECTED NOT DETECTED Final   Enterobacter cloacae complex NOT DETECTED NOT DETECTED Final   Escherichia coli NOT DETECTED NOT DETECTED Final   Klebsiella oxytoca NOT DETECTED NOT DETECTED Final   Klebsiella pneumoniae NOT DETECTED NOT DETECTED Final   Proteus species NOT DETECTED NOT DETECTED Final   Serratia marcescens NOT DETECTED NOT DETECTED Final   Carbapenem resistance NOT DETECTED NOT DETECTED Final   Haemophilus influenzae NOT DETECTED NOT DETECTED Final   Neisseria meningitidis NOT DETECTED NOT DETECTED Final   Pseudomonas aeruginosa NOT DETECTED NOT DETECTED Final   Candida albicans NOT DETECTED NOT DETECTED Final   Candida glabrata NOT DETECTED NOT DETECTED Final   Candida krusei NOT DETECTED NOT DETECTED Final   Candida parapsilosis NOT DETECTED NOT DETECTED Final   Candida tropicalis NOT DETECTED NOT DETECTED Final  Culture, blood (Routine X 2) w Reflex to ID Panel     Status: None (Preliminary result)   Collection Time: 05/22/18  3:27 PM  Result Value Ref Range Status   Specimen Description   Final    BLOOD LEFT ANTECUBITAL Performed at [facility], [address], [place], [place] [address]    Special Requests   Final    BOTTLES DRAWN AEROBIC AND ANAEROBIC Blood Culture adequate volume Performed at [facility] [address] [place], [place] [address]    Culture   Final    NO GROWTH 3 DAYS Performed at [facility], [address], [place], [place] [address]    Report Status PENDING  Incomplete  Culture, blood (Routine X 2) w Reflex to ID Panel     Status: None (Preliminary result)  Collection Time: 05/22/18  3:28 PM  Result Value Ref Range Status   Specimen Description   Final    BLOOD LEFT HAND Performed at [facility], [address] [place], [place] [address]    Special Requests   Final    BOTTLES DRAWN AEROBIC ONLY Blood Culture adequate volume Performed  at [facility], [address] [place], [place] [address]    Culture   Final    NO GROWTH 3 DAYS Performed at [facility], [address], [place], [place] [address]    Report Status PENDING  Incomplete     Radiology Studies: No results found.  Scheduled Meds: . enoxaparin (LOVENOX) injection  40 mg Subcutaneous Q24H  . folic acid  1 mg Oral Daily  . hydrocerin   Topical Daily  . multivitamin with minerals  1 tablet Oral Daily  . potassium chloride  40 mEq Oral Once  . predniSONE  40 mg Oral Q breakfast  . thiamine  100 mg Oral Daily   Or  . thiamine  100 mg Intravenous Daily   Continuous Infusions: . sodium chloride 100 mL/hr at 05/25/18 0928  . ampicillin-sulbactam (UNASYN) IV 3 g (05/25/18 3716)     LOS: 5 days   Rickey Barbara, MD [facility] Pager On Amion  If 7PM-7AM, please contact night-coverage 05/25/2018, 3:14 PM

## 2018-05-26 ENCOUNTER — Inpatient Hospital Stay (HOSPITAL_COMMUNITY): Payer: Self-pay

## 2018-05-26 LAB — BASIC METABOLIC PANEL
Anion gap: 6 (ref 5–15)
BUN: 9 mg/dL (ref 6–20)
CO2: 26 mmol/L (ref 22–32)
Calcium: 8.1 mg/dL — ABNORMAL LOW (ref 8.9–10.3)
Chloride: 106 mmol/L (ref 98–111)
Creatinine, Ser: 0.5 mg/dL — ABNORMAL LOW (ref 0.61–1.24)
GFR calc Af Amer: >60 mL/min (ref >60)
GFR calc non Af Amer: >60 mL/min (ref >60)
Glucose, Bld: 121 mg/dL — ABNORMAL HIGH (ref 70–99)
Potassium: 3.6 mmol/L (ref 3.5–5.1)
SODIUM: 138 mmol/L (ref 135–145)

## 2018-05-26 LAB — CK: Total CK: 1699 U/L — ABNORMAL HIGH (ref 49–397)

## 2018-05-26 NOTE — Progress Notes (Signed)
PROGRESS NOTE    Jake Nguyen  [name]:[id number] DOB: 30-Nov-1965 DOA: 05/20/2018 PCP: Patient, No Pcp Per    Brief Narrative:  52 y.o.malewith medical history significant ofalcohol abuse but otherwise limited medical history was brought to the hospital for evaluation of change in mental status and unresponsiveness likely secondary to alcohol intoxication.  History was limited due to his mental status.  Upon admission he was found to be hyponatremic, and rhabdomyolysis with elevated lactate level.  His LFTs were elevated as well.  He was positive for influenza.  He was febrile during the hospitalization and blood cultures ended up growing Acinetobacter and 1 bottle staph aureus with methicillin resistance.  Patient started on imipenem and vancomycin.   Assessment & Plan:   Principal Problem:   Rhabdomyolysis Active Problems:   Hyponatremia   Elevated troponin   Moderate dehydration   Alcohol use   Influenza A   Transaminitis   Pressure injury of skin  Acute metabolic encephalopathy secondary to alcohol intoxication, improving Acute rhabdomyolysis, worsening Moderate dehydration with elevated anion gap, resolved -CK has trended down with IVF hydration -CK down to 1K -Reduce IVF to 75cc/hr  Bacteremia-Acinetobacter. -Blood cultures are positive for Acinetobacter.  Also positive for what appears to be MRSA just in 1 bottle.  Due to his fevers overnight, he was started empirically  on vancomycin and imipenem. Earlier discussed with ID.  -To complete course of Unasyn today for total of 5 days of tx -Repeat cultures noted to be neg   Hypokalemia -replaced -Recheck bmet in AM  Hyponatremia, secondary to hypovolemia -Improved with hydration -Repeat bmet in AM  Elevated troponin -Trended down.  Unlikely ACS.   - chest pain free at this time  Transaminitis Alcohol use -LFTs trending down -Alcohol withdrawal protocol in place. Folic acid, thiamine and multivitamins  ordered. -Continue to follow LFT's  Influenza A positive -Day 5 Tamiflu.  Supportive care. -Presently stable  Thrombocytopenia -Likely secondary to alcohol use. -Will repeat CBC in AM  B foot pain -Recent BLE swelling and pain -improved with steroids given -Suspect gout in setting of known significant ETOH abuse -Ordered and reviewed B foot xrays, found to be unremarkable -continue prednisone as tolerated  DVT prophylaxis: Lovenox subQ Code Status: Full Family Communication: Pt in room, family not at bedside Disposition Plan: Uncertain at this time  Consultants:     Procedures:     Antimicrobials: Anti-infectives (From admission, onward)   Start     Dose/Rate Route Frequency Ordered Stop   05/23/18 1800  Ampicillin-Sulbactam (UNASYN) 3 g in sodium chloride 0.9 % 100 mL IVPB     3 g 200 mL/hr over 30 Minutes Intravenous Every 6 hours 05/23/18 1630     05/22/18 2100  vancomycin (VANCOCIN) IVPB 1000 mg/200 mL premix  Status:  Discontinued     1,000 mg 200 mL/hr over 60 Minutes Intravenous Every 12 hours 05/22/18 1153 05/23/18 1630   05/22/18 0815  vancomycin (VANCOCIN) 1,500 mg in sodium chloride 0.9 % 500 mL IVPB     1,500 mg 250 mL/hr over 120 Minutes Intravenous STAT 05/22/18 0754 05/22/18 1209   05/21/18 1400  imipenem-cilastatin (PRIMAXIN) 500 mg in sodium chloride 0.9 % 100 mL IVPB  Status:  Discontinued     500 mg 200 mL/hr over 30 Minutes Intravenous Every 6 hours 05/21/18 1328 05/23/18 1630   05/20/18 1800  oseltamivir (TAMIFLU) capsule 75 mg     75 mg Oral 2 times daily 05/20/18 1738 05/25/18 0927  Subjective: Still with B foot pain, improved  Objective: Vitals:   05/26/18 0002 05/26/18 0541 05/26/18 0547 05/26/18 1330  BP: (!) 149/92 (!) 153/95  139/85  Pulse: (!) 59 (!) 58  68  Resp: 18 18  15   Temp: 99.2 F (37.3 C) 99.4 F (37.4 C)  98.9 F (37.2 C)  TempSrc: Oral Oral  Oral  SpO2: 95% 96%  97%  Weight:   70.4 kg   Height:         Intake/Output Summary (Last 24 hours) at 05/26/2018 1619 Last data filed at 05/26/2018 1502 Gross per 24 hour  Intake 4723.91 ml  Output 4725 ml  Net -1.09 ml   Filed Weights   05/21/18 0617 05/25/18 0550 05/26/18 0547  Weight: 70.5 kg 73.3 kg 70.4 kg    Examination: General exam: Awake, laying in bed, in nad Respiratory system: Normal respiratory effort, no wheezing Cardiovascular system: regular rate, s1, s2 Gastrointestinal system: Soft, nondistended, positive BS Central nervous system: CN2-12 grossly intact, strength intact Extremities: Perfused, no clubbing Skin: Normal skin turgor, no notable skin lesions seen Psychiatry: Mood normal // no visual hallucinations   Data Reviewed: I have personally reviewed following labs and imaging studies  CBC: Recent Labs  Lab 05/20/18 1357  05/21/18 0052 05/22/18 0508 05/23/18 0413 05/24/18 0357 05/25/18 0357  WBC 4.3  --  7.4 8.5 8.3 10.1 11.0*  NEUTROABS 2.5  --   --   --   --   --   --   HGB 14.3  --  10.6* 11.3* 10.0* 10.5* 10.0*  HCT 42.6   < > 31.5* 35.3* 31.0* 32.6* 31.6*  MCV 87.8  --  87.3 90.7 88.3 90.6 91.1  PLT PLATELET CLUMPS NOTED ON SMEAR, UNABLE TO ESTIMATE  --  90* 124* 181 255 327   < > = values in this interval not displayed.   Basic Metabolic Panel: Recent Labs  Lab 05/21/18 0052 05/22/18 0508 05/22/18 1527 05/23/18 0413 05/24/18 0357 05/25/18 0357 05/26/18 0415  NA 125* 132* 134* 134* 135 134* 138  K 2.8* 2.8* 3.0* 2.8* 3.7 3.4* 3.6  CL 88* 95* 98 99 103 104 106  CO2 23 25 28 25 26 25 26   GLUCOSE 134* 93 131* 139* 115* 108* 121*  BUN 15 6 <5* <5* 7 9 9   CREATININE 0.76 0.53* 0.57* 0.57* 0.55* 0.56* 0.50*  CALCIUM 7.4* 7.8* 7.9* 7.6* 8.1* 8.1* 8.1*  MG 2.0 2.0  --  1.7 1.9 1.6*  --    GFR: Estimated Creatinine Clearance: 106.9 mL/min (A) (by C-G formula based on SCr of 0.5 mg/dL (L)). Liver Function Tests: Recent Labs  Lab 05/21/18 0052 05/22/18 0508 05/23/18 0413 05/24/18 0357  05/25/18 0357  AST 510* 669* 503* 352* 261*  ALT 94* 110* 100* 99* 123*  ALKPHOS 30* 40 39 48 56  BILITOT 0.9 0.9 0.6 0.8 0.5  PROT 6.2* 6.7 6.1* 6.9 6.3*  ALBUMIN 2.6* 2.5* 2.3* 2.5* 2.4*   No results for input(s): LIPASE, AMYLASE in the last 168 hours. Recent Labs  Lab 05/20/18 1627  AMMONIA 26   Coagulation Profile: Recent Labs  Lab 05/20/18 1357  INR 0.93   Cardiac Enzymes: Recent Labs  Lab 05/20/18 1357 05/20/18 1923 05/21/18 0052  05/22/18 1527 05/23/18 0413 05/24/18 0357 05/25/18 0357 05/26/18 0415  CKTOTAL 18,367*  --  17,614*   < > 17,441* 11,546* 6,266* 2,852* 1,699*  TROPONINI 0.33* 0.30* 0.23*  --   --   --   --   --   --    < > =  values in this interval not displayed.   BNP (last 3 results) No results for input(s): PROBNP in the last 8760 hours. HbA1C: No results for input(s): HGBA1C in the last 72 hours. CBG: Recent Labs  Lab 05/24/18 1120 05/24/18 1614 05/24/18 2256 05/25/18 0740 05/25/18 1148  GLUCAP 174* 142* 149* 99 151*   Lipid Profile: No results for input(s): CHOL, HDL, LDLCALC, TRIG, CHOLHDL, LDLDIRECT in the last 72 hours. Thyroid Function Tests: No results for input(s): TSH, T4TOTAL, FREET4, T3FREE, THYROIDAB in the last 72 hours. Anemia Panel: No results for input(s): VITAMINB12, FOLATE, FERRITIN, TIBC, IRON, RETICCTPCT in the last 72 hours. Sepsis Labs: Recent Labs  Lab 05/20/18 1357 05/20/18 1557  LATICACIDVEN 3.1* 1.9    Recent Results (from the past 240 hour(s))  Urine culture     Status: Abnormal   Collection Time: 05/20/18  2:58 PM  Result Value Ref Range Status   Specimen Description   Final    URINE, RANDOM Performed at [facility], [address] [place], [place] [address]    Special Requests   Final    NONE Performed at [facility], [address] [place], [place] [address]    Culture MULTIPLE SPECIES PRESENT, SUGGEST RECOLLECTION (A)  Final   Report Status  05/22/2018 FINAL  Final  MRSA PCR Screening     Status: None   Collection Time: 05/20/18  6:28 PM  Result Value Ref Range Status   MRSA by PCR NEGATIVE NEGATIVE Final    Comment:        The GeneXpert MRSA Assay (FDA approved for NASAL specimens only), is one component of a comprehensive MRSA colonization surveillance program. It is not intended to diagnose MRSA infection nor to guide or monitor treatment for MRSA infections. Performed at [facility], [address] [place], [place] [address]   Culture, blood (Routine X 2) w Reflex to ID Panel     Status: Abnormal   Collection Time: 05/20/18  7:23 PM  Result Value Ref Range Status   Specimen Description   Final    BLOOD BLOOD RIGHT HAND Performed at [facility], [address] [place], [place] [address]    Special Requests   Final    BOTTLES DRAWN AEROBIC ONLY Blood Culture adequate volume Performed at [facility], [address] [place], [place] [address]    Culture  Setup Time   Final    GRAM POSITIVE COCCI GRAM NEGATIVE COCCOBACILLI AEROBIC BOTTLE ONLY CRITICAL RESULT CALLED TO, READ BACK BY AND VERIFIED WITH: PHARMD Forcier, E [phone] FCP    Culture (A)  Final    AEROCOCCUS VIRIDANS ACINETOBACTER CALCOACETICUS/BAUMANNII COMPLEX STAPHYLOCOCCUS SPECIES (COAGULASE NEGATIVE) THE SIGNIFICANCE OF ISOLATING THIS ORGANISM FROM A SINGLE SET OF BLOOD CULTURES WHEN MULTIPLE SETS ARE DRAWN IS UNCERTAIN. PLEASE NOTIFY THE MICROBIOLOGY DEPARTMENT WITHIN ONE WEEK IF SPECIATION AND SENSITIVITIES ARE REQUIRED. Performed at [facility], [address] [place], [place] [address]    Report Status 05/23/2018 FINAL  Final   Organism ID, Bacteria ACINETOBACTER CALCOACETICUS/BAUMANNII COMPLEX  Final      Susceptibility   Acinetobacter calcoaceticus/baumannii complex - MIC*    CEFTAZIDIME 4 SENSITIVE Sensitive     CEFTRIAXONE 16 INTERMEDIATE Intermediate      CIPROFLOXACIN <=0.25 SENSITIVE Sensitive     GENTAMICIN <=1 SENSITIVE Sensitive     IMIPENEM <=0.25 SENSITIVE Sensitive     PIP/TAZO 8 SENSITIVE Sensitive     TRIMETH/SULFA <=20  SENSITIVE Sensitive     CEFEPIME 2 SENSITIVE Sensitive     AMPICILLIN/SULBACTAM <=2 SENSITIVE Sensitive     * ACINETOBACTER CALCOACETICUS/BAUMANNII COMPLEX  Culture, blood (Routine X 2) w Reflex to ID Panel     Status: None   Collection Time: 05/20/18  7:23 PM  Result Value Ref Range Status   Specimen Description   Final    BLOOD RIGHT ANTECUBITAL Performed at [facility], [address], [place], [place] [address]    Special Requests   Final    BOTTLES DRAWN AEROBIC ONLY Blood Culture adequate volume Performed at [facility], [address] [place], [place] [address]    Culture   Final    NO GROWTH 5 DAYS Performed at [facility], [address] [place], [place] [address]    Report Status 05/25/2018 FINAL  Final  Blood Culture ID Panel (Reflexed)     Status: Abnormal   Collection Time: 05/20/18  7:23 PM  Result Value Ref Range Status   Enterococcus species NOT DETECTED NOT DETECTED Final   Listeria monocytogenes NOT DETECTED NOT DETECTED Final   Staphylococcus species DETECTED (A) NOT DETECTED Final    Comment: Methicillin (oxacillin) resistant coagulase negative staphylococcus. Possible blood culture contaminant (unless isolated from more than one blood culture draw or clinical case suggests pathogenicity). No antibiotic treatment is indicated for blood  culture contaminants. CRITICAL RESULT CALLED TO, READ BACK BY AND VERIFIED WITH: PHARMD Stotts, E [phone] FCP    Staphylococcus aureus (BCID) NOT DETECTED NOT DETECTED Final   Methicillin resistance DETECTED (A) NOT DETECTED Final    Comment: CRITICAL RESULT CALLED TO, READ BACK BY AND VERIFIED WITH: PHARMD Horace, E [phone] FCP    Streptococcus species NOT DETECTED NOT DETECTED Final    Streptococcus agalactiae NOT DETECTED NOT DETECTED Final   Streptococcus pneumoniae NOT DETECTED NOT DETECTED Final   Streptococcus pyogenes NOT DETECTED NOT DETECTED Final   Acinetobacter baumannii DETECTED (A) NOT DETECTED Final    Comment: CRITICAL RESULT CALLED TO, READ BACK BY AND VERIFIED WITH: PHARMD Lahmann, E [phone] FCP    Enterobacteriaceae species NOT DETECTED NOT DETECTED Final   Enterobacter cloacae complex NOT DETECTED NOT DETECTED Final   Escherichia coli NOT DETECTED NOT DETECTED Final   Klebsiella oxytoca NOT DETECTED NOT DETECTED Final   Klebsiella pneumoniae NOT DETECTED NOT DETECTED Final   Proteus species NOT DETECTED NOT DETECTED Final   Serratia marcescens NOT DETECTED NOT DETECTED Final   Carbapenem resistance NOT DETECTED NOT DETECTED Final   Haemophilus influenzae NOT DETECTED NOT DETECTED Final   Neisseria meningitidis NOT DETECTED NOT DETECTED Final   Pseudomonas aeruginosa NOT DETECTED NOT DETECTED Final   Candida albicans NOT DETECTED NOT DETECTED Final   Candida glabrata NOT DETECTED NOT DETECTED Final   Candida krusei NOT DETECTED NOT DETECTED Final   Candida parapsilosis NOT DETECTED NOT DETECTED Final   Candida tropicalis NOT DETECTED NOT DETECTED Final  Culture, blood (Routine X 2) w Reflex to ID Panel     Status: None (Preliminary result)   Collection Time: 05/22/18  3:27 PM  Result Value Ref Range Status   Specimen Description   Final    BLOOD LEFT ANTECUBITAL Performed at [facility], [address], [place], [place] [address]    Special Requests   Final    BOTTLES DRAWN AEROBIC AND ANAEROBIC Blood Culture adequate volume Performed at [facility], [address]  [place], [place] [address]    Culture   Final    NO GROWTH 4 DAYS Performed at [facility], [address] [place], [place] [address]    Report Status PENDING  Incomplete  Culture, blood (Routine X 2) w Reflex to ID Panel      Status: None (Preliminary result)   Collection Time: 05/22/18  3:28 PM  Result Value Ref Range Status   Specimen Description   Final    BLOOD LEFT HAND Performed at [facility], [address] [place], [place] [address]    Special Requests   Final    BOTTLES DRAWN AEROBIC ONLY Blood Culture adequate volume Performed at [facility], [address] [place], [place] [address]    Culture   Final    NO GROWTH 4 DAYS Performed at [facility], [address], [place], [place] [address]    Report Status PENDING  Incomplete     Radiology Studies: Dg Foot 2 Views Left  Result Date: 05/26/2018 CLINICAL DATA:  Diabetic with bilateral foot pain for 1 year. EXAM: LEFT FOOT - 2 VIEW COMPARISON:  None. FINDINGS: The mineralization and alignment are normal. There is no evidence of acute fracture or dislocation. There are mild talonavicular degenerative changes. The joint spaces are otherwise preserved. No focal soft tissue swelling or foreign body identified. IMPRESSION: No acute findings.  Mild talonavicular degenerative changes. Electronically Signed   By: Carey Bullocks M.D.   On: 05/26/2018 15:37   Dg Foot 2 Views Right  Result Date: 05/26/2018 CLINICAL DATA:  Bilateral foot pain for 1 year.  Diabetes. EXAM: RIGHT FOOT - 2 VIEW COMPARISON:  None. FINDINGS: The mineralization and alignment are normal. There is no evidence of acute fracture or dislocation. The joint spaces are maintained. No focal soft tissue swelling or foreign bodies identified. IMPRESSION: Normal examination. Electronically Signed   By: Carey Bullocks M.D.   On: 05/26/2018 15:36    Scheduled Meds: . enoxaparin (LOVENOX) injection  40 mg Subcutaneous Q24H  . folic acid  1 mg Oral Daily  . hydrocerin   Topical Daily  . multivitamin with minerals  1 tablet Oral Daily  . predniSONE  40 mg Oral Q breakfast  . thiamine  100 mg Oral Daily   Or  . thiamine  100 mg Intravenous Daily    Continuous Infusions: . sodium chloride 75 mL/hr at 05/26/18 1036  . ampicillin-sulbactam (UNASYN) IV 3 g (05/26/18 1408)     LOS: 6 days   Rickey Barbara, MD [facility] Pager On Amion  If 7PM-7AM, please contact night-coverage 05/26/2018, 4:19 PM

## 2018-05-27 LAB — COMPREHENSIVE METABOLIC PANEL
ALK PHOS: 57 U/L (ref 38–126)
ALT: 142 U/L — ABNORMAL HIGH (ref 0–44)
AST: 104 U/L — ABNORMAL HIGH (ref 15–41)
Albumin: 2.6 g/dL — ABNORMAL LOW (ref 3.5–5.0)
Anion gap: 7 (ref 5–15)
BUN: 8 mg/dL (ref 6–20)
CO2: 26 mmol/L (ref 22–32)
Calcium: 8.4 mg/dL — ABNORMAL LOW (ref 8.9–10.3)
Chloride: 102 mmol/L (ref 98–111)
Creatinine, Ser: 0.55 mg/dL — ABNORMAL LOW (ref 0.61–1.24)
GFR calc Af Amer: >60 mL/min (ref >60)
GFR calc non Af Amer: >60 mL/min (ref >60)
Glucose, Bld: 132 mg/dL — ABNORMAL HIGH (ref 70–99)
Potassium: 4 mmol/L (ref 3.5–5.1)
Sodium: 135 mmol/L (ref 135–145)
Total Bilirubin: 0.7 mg/dL (ref 0.3–1.2)
Total Protein: 7.1 g/dL (ref 6.5–8.1)

## 2018-05-27 LAB — CULTURE, BLOOD (ROUTINE X 2)
Culture: NO GROWTH
Culture: NO GROWTH
Special Requests: ADEQUATE
Special Requests: ADEQUATE

## 2018-05-27 NOTE — Progress Notes (Signed)
PROGRESS NOTE    Jake Nguyen  [name]:[id number] DOB: 1965/08/18 DOA: 05/20/2018 PCP: Patient, No Pcp Per    Brief Narrative:  53 y.o.malewith medical history significant ofalcohol abuse but otherwise limited medical history was brought to the hospital for evaluation of change in mental status and unresponsiveness likely secondary to alcohol intoxication.  History was limited due to his mental status.  Upon admission he was found to be hyponatremic, and rhabdomyolysis with elevated lactate level.  His LFTs were elevated as well.  He was positive for influenza.  He was febrile during the hospitalization and blood cultures ended up growing Acinetobacter and 1 bottle staph aureus with methicillin resistance.  Patient started on imipenem and vancomycin.   Assessment & Plan:   Principal Problem:   Rhabdomyolysis Active Problems:   Hyponatremia   Elevated troponin   Moderate dehydration   Alcohol use   Influenza A   Transaminitis   Pressure injury of skin  Acute metabolic encephalopathy secondary to alcohol intoxication, improving Acute rhabdomyolysis, worsening Moderate dehydration with elevated anion gap, resolved -CK has trended down with IVF hydration -CK has since trended down -Continued on basal IVF  Bacteremia-Acinetobacter. -Blood cultures are positive for Acinetobacter.  Also positive for what appears to be MRSA just in 1 bottle.  Due to his fevers overnight, he was started empirically  on vancomycin and imipenem. Earlier discussed with ID.  -Completed course of Unasyn today for total of 5 days of tx -Reviewed cultures noted to be neg   Hypokalemia -replaced -Potassium normal  Hyponatremia, secondary to hypovolemia -Improved with hydration -sodium within normal limits  Elevated troponin -Trended down.  Unlikely ACS.   - Remains chest pain free  Transaminitis Alcohol use -LFTs trending down -Alcohol withdrawal protocol in place. Folic acid, thiamine and  multivitamins ordered. -Continue to follow LFT's  Influenza A positive -Day 5 Tamiflu.  Supportive care. -Currently stable  Thrombocytopenia -Likely secondary to alcohol use. -normalized  B foot pain -Recent BLE swelling and pain -improved with steroids given -Suspect gout in setting of known significant ETOH abuse -Ordered and reviewed B foot xrays, found to be unremarkable -tolerating prednisone -PT/OT eval with recs for SNF, pending placement  DVT prophylaxis: Lovenox subQ Code Status: Full Family Communication: Pt in room, family not at bedside Disposition Plan: Uncertain at this time  Consultants:     Procedures:     Antimicrobials: Anti-infectives (From admission, onward)   Start     Dose/Rate Route Frequency Ordered Stop   05/23/18 1800  Ampicillin-Sulbactam (UNASYN) 3 g in sodium chloride 0.9 % 100 mL IVPB     3 g 200 mL/hr over 30 Minutes Intravenous Every 6 hours 05/23/18 1630     05/22/18 2100  vancomycin (VANCOCIN) IVPB 1000 mg/200 mL premix  Status:  Discontinued     1,000 mg 200 mL/hr over 60 Minutes Intravenous Every 12 hours 05/22/18 1153 05/23/18 1630   05/22/18 0815  vancomycin (VANCOCIN) 1,500 mg in sodium chloride 0.9 % 500 mL IVPB     1,500 mg 250 mL/hr over 120 Minutes Intravenous STAT 05/22/18 0754 05/22/18 1209   05/21/18 1400  imipenem-cilastatin (PRIMAXIN) 500 mg in sodium chloride 0.9 % 100 mL IVPB  Status:  Discontinued     500 mg 200 mL/hr over 30 Minutes Intravenous Every 6 hours 05/21/18 1328 05/23/18 1630   05/20/18 1800  oseltamivir (TAMIFLU) capsule 75 mg     75 mg Oral 2 times daily 05/20/18 1738 05/25/18 0927  Subjective: States foot pain improved  Objective: Vitals:   05/26/18 0547 05/26/18 1330 05/26/18 2157 05/27/18 0619  BP:  139/85 118/79 (!) 127/92  Pulse:  68 68 71  Resp:  15 18 18   Temp:  98.9 F (37.2 C) 98.9 F (37.2 C) 98.8 F (37.1 C)  TempSrc:  Oral Oral Oral  SpO2:  97% 95% 95%  Weight: 70.4 kg      Height:        Intake/Output Summary (Last 24 hours) at 05/27/2018 0945 Last data filed at 05/27/2018 0102 Gross per 24 hour  Intake 1464.39 ml  Output 5950 ml  Net -4485.61 ml   Filed Weights   05/21/18 0617 05/25/18 0550 05/26/18 0547  Weight: 70.5 kg 73.3 kg 70.4 kg    Examination: General exam: Conversant, in no acute distress Respiratory system: normal chest rise, clear, no audible wheezing Cardiovascular system: regular rhythm, s1-s2 Gastrointestinal system: Nondistended, nontender, pos BS Central nervous system: No seizures, no tremors Extremities: No cyanosis, no joint deformities Skin: No rashes, no pallor Psychiatry: Affect normal // no auditory hallucinations   Data Reviewed: I have personally reviewed following labs and imaging studies  CBC: Recent Labs  Lab 05/20/18 1357  05/21/18 0052 05/22/18 0508 05/23/18 0413 05/24/18 0357 05/25/18 0357  WBC 4.3  --  7.4 8.5 8.3 10.1 11.0*  NEUTROABS 2.5  --   --   --   --   --   --   HGB 14.3  --  10.6* 11.3* 10.0* 10.5* 10.0*  HCT 42.6   < > 31.5* 35.3* 31.0* 32.6* 31.6*  MCV 87.8  --  87.3 90.7 88.3 90.6 91.1  PLT PLATELET CLUMPS NOTED ON SMEAR, UNABLE TO ESTIMATE  --  90* 124* 181 255 327   < > = values in this interval not displayed.   Basic Metabolic Panel: Recent Labs  Lab 05/21/18 0052 05/22/18 0508  05/23/18 0413 05/24/18 0357 05/25/18 0357 05/26/18 0415 05/27/18 0457  NA 125* 132*   < > 134* 135 134* 138 135  K 2.8* 2.8*   < > 2.8* 3.7 3.4* 3.6 4.0  CL 88* 95*   < > 99 103 104 106 102  CO2 23 25   < > 25 26 25 26 26   GLUCOSE 134* 93   < > 139* 115* 108* 121* 132*  BUN 15 6   < > <5* 7 9 9 8   CREATININE 0.76 0.53*   < > 0.57* 0.55* 0.56* 0.50* 0.55*  CALCIUM 7.4* 7.8*   < > 7.6* 8.1* 8.1* 8.1* 8.4*  MG 2.0 2.0  --  1.7 1.9 1.6*  --   --    < > = values in this interval not displayed.   GFR: Estimated Creatinine Clearance: 106.9 mL/min (A) (by C-G formula based on SCr of 0.55 mg/dL  (L)). Liver Function Tests: Recent Labs  Lab 05/22/18 0508 05/23/18 0413 05/24/18 0357 05/25/18 0357 05/27/18 0457  AST 669* 503* 352* 261* 104*  ALT 110* 100* 99* 123* 142*  ALKPHOS 40 39 48 56 57  BILITOT 0.9 0.6 0.8 0.5 0.7  PROT 6.7 6.1* 6.9 6.3* 7.1  ALBUMIN 2.5* 2.3* 2.5* 2.4* 2.6*   No results for input(s): LIPASE, AMYLASE in the last 168 hours. Recent Labs  Lab 05/20/18 1627  AMMONIA 26   Coagulation Profile: Recent Labs  Lab 05/20/18 1357  INR 0.93   Cardiac Enzymes: Recent Labs  Lab 05/20/18 1357 05/20/18 1923 05/21/18 0052  05/22/18 1527  05/23/18 0413 05/24/18 0357 05/25/18 0357 05/26/18 0415  CKTOTAL 18,367*  --  17,614*   < > 17,441* 11,546* 6,266* 2,852* 1,699*  TROPONINI 0.33* 0.30* 0.23*  --   --   --   --   --   --    < > = values in this interval not displayed.   BNP (last 3 results) No results for input(s): PROBNP in the last 8760 hours. HbA1C: No results for input(s): HGBA1C in the last 72 hours. CBG: Recent Labs  Lab 05/24/18 1120 05/24/18 1614 05/24/18 2256 05/25/18 0740 05/25/18 1148  GLUCAP 174* 142* 149* 99 151*   Lipid Profile: No results for input(s): CHOL, HDL, LDLCALC, TRIG, CHOLHDL, LDLDIRECT in the last 72 hours. Thyroid Function Tests: No results for input(s): TSH, T4TOTAL, FREET4, T3FREE, THYROIDAB in the last 72 hours. Anemia Panel: No results for input(s): VITAMINB12, FOLATE, FERRITIN, TIBC, IRON, RETICCTPCT in the last 72 hours. Sepsis Labs: Recent Labs  Lab 05/20/18 1357 05/20/18 1557  LATICACIDVEN 3.1* 1.9    Recent Results (from the past 240 hour(s))  Urine culture     Status: Abnormal   Collection Time: 05/20/18  2:58 PM  Result Value Ref Range Status   Specimen Description   Final    URINE, RANDOM Performed at [facility], [address] [place], [place] [address]    Special Requests   Final    NONE Performed at [facility], [address]  [place], [place] [address]    Culture MULTIPLE SPECIES PRESENT, SUGGEST RECOLLECTION (A)  Final   Report Status 05/22/2018 FINAL  Final  MRSA PCR Screening     Status: None   Collection Time: 05/20/18  6:28 PM  Result Value Ref Range Status   MRSA by PCR NEGATIVE NEGATIVE Final    Comment:        The GeneXpert MRSA Assay (FDA approved for NASAL specimens only), is one component of a comprehensive MRSA colonization surveillance program. It is not intended to diagnose MRSA infection nor to guide or monitor treatment for MRSA infections. Performed at [facility], [address] [place], [place] [address]   Culture, blood (Routine X 2) w Reflex to ID Panel     Status: Abnormal   Collection Time: 05/20/18  7:23 PM  Result Value Ref Range Status   Specimen Description   Final    BLOOD BLOOD RIGHT HAND Performed at [facility], [address] [place], [place] [address]    Special Requests   Final    BOTTLES DRAWN AEROBIC ONLY Blood Culture adequate volume Performed at [facility], [address] [place], [place] [address]    Culture  Setup Time   Final    GRAM POSITIVE COCCI GRAM NEGATIVE COCCOBACILLI AEROBIC BOTTLE ONLY CRITICAL RESULT CALLED TO, READ BACK BY AND VERIFIED WITH: PHARMD Hannula, E [phone] FCP    Culture (A)  Final    AEROCOCCUS VIRIDANS ACINETOBACTER CALCOACETICUS/BAUMANNII COMPLEX STAPHYLOCOCCUS SPECIES (COAGULASE NEGATIVE) THE SIGNIFICANCE OF ISOLATING THIS ORGANISM FROM A SINGLE SET OF BLOOD CULTURES WHEN MULTIPLE SETS ARE DRAWN IS UNCERTAIN. PLEASE NOTIFY THE MICROBIOLOGY DEPARTMENT WITHIN ONE WEEK IF SPECIATION AND SENSITIVITIES ARE REQUIRED. Performed at [facility], [address] [place], [place] [address]    Report Status 05/23/2018 FINAL  Final   Organism ID, Bacteria ACINETOBACTER CALCOACETICUS/BAUMANNII COMPLEX  Final      Susceptibility   Acinetobacter calcoaceticus/baumannii  complex - MIC*  CEFTAZIDIME 4 SENSITIVE Sensitive     CEFTRIAXONE 16 INTERMEDIATE Intermediate     CIPROFLOXACIN <=0.25 SENSITIVE Sensitive     GENTAMICIN <=1 SENSITIVE Sensitive     IMIPENEM <=0.25 SENSITIVE Sensitive     PIP/TAZO 8 SENSITIVE Sensitive     TRIMETH/SULFA <=20 SENSITIVE Sensitive     CEFEPIME 2 SENSITIVE Sensitive     AMPICILLIN/SULBACTAM <=2 SENSITIVE Sensitive     * ACINETOBACTER CALCOACETICUS/BAUMANNII COMPLEX  Culture, blood (Routine X 2) w Reflex to ID Panel     Status: None   Collection Time: 05/20/18  7:23 PM  Result Value Ref Range Status   Specimen Description   Final    BLOOD RIGHT ANTECUBITAL Performed at [facility], [address] [place], [place] [address]    Special Requests   Final    BOTTLES DRAWN AEROBIC ONLY Blood Culture adequate volume Performed at [facility], [address] [place], [place] [address]    Culture   Final    NO GROWTH 5 DAYS Performed at [facility], [address] [place], [place] [address]    Report Status 05/25/2018 FINAL  Final  Blood Culture ID Panel (Reflexed)     Status: Abnormal   Collection Time: 05/20/18  7:23 PM  Result Value Ref Range Status   Enterococcus species NOT DETECTED NOT DETECTED Final   Listeria monocytogenes NOT DETECTED NOT DETECTED Final   Staphylococcus species DETECTED (A) NOT DETECTED Final    Comment: Methicillin (oxacillin) resistant coagulase negative staphylococcus. Possible blood culture contaminant (unless isolated from more than one blood culture draw or clinical case suggests pathogenicity). No antibiotic treatment is indicated for blood  culture contaminants. CRITICAL RESULT CALLED TO, READ BACK BY AND VERIFIED WITH: PHARMD Beverlin, E [id number] FCP    Staphylococcus aureus (BCID) NOT DETECTED NOT DETECTED Final   Methicillin resistance DETECTED (A) NOT DETECTED Final    Comment: CRITICAL RESULT CALLED TO, READ BACK BY AND VERIFIED  WITH: PHARMD Amis, E [id number] FCP    Streptococcus species NOT DETECTED NOT DETECTED Final   Streptococcus agalactiae NOT DETECTED NOT DETECTED Final   Streptococcus pneumoniae NOT DETECTED NOT DETECTED Final   Streptococcus pyogenes NOT DETECTED NOT DETECTED Final   Acinetobacter baumannii DETECTED (A) NOT DETECTED Final    Comment: CRITICAL RESULT CALLED TO, READ BACK BY AND VERIFIED WITH: PHARMD Jobst, E [id number] FCP    Enterobacteriaceae species NOT DETECTED NOT DETECTED Final   Enterobacter cloacae complex NOT DETECTED NOT DETECTED Final   Escherichia coli NOT DETECTED NOT DETECTED Final   Klebsiella oxytoca NOT DETECTED NOT DETECTED Final   Klebsiella pneumoniae NOT DETECTED NOT DETECTED Final   Proteus species NOT DETECTED NOT DETECTED Final   Serratia marcescens NOT DETECTED NOT DETECTED Final   Carbapenem resistance NOT DETECTED NOT DETECTED Final   Haemophilus influenzae NOT DETECTED NOT DETECTED Final   Neisseria meningitidis NOT DETECTED NOT DETECTED Final   Pseudomonas aeruginosa NOT DETECTED NOT DETECTED Final   Candida albicans NOT DETECTED NOT DETECTED Final   Candida glabrata NOT DETECTED NOT DETECTED Final   Candida krusei NOT DETECTED NOT DETECTED Final   Candida parapsilosis NOT DETECTED NOT DETECTED Final   Candida tropicalis NOT DETECTED NOT DETECTED Final  Culture, blood (Routine X 2) w Reflex to ID Panel     Status: None   Collection Time: 05/22/18  3:27 PM  Result Value Ref Range Status   Specimen Description   Final  BLOOD LEFT ANTECUBITAL Performed at [facility], [address] [place], [place] [address]    Special Requests   Final    BOTTLES DRAWN AEROBIC AND ANAEROBIC Blood Culture adequate volume Performed at [facility], [address] [place], [place] [address]    Culture   Final    NO GROWTH 5 DAYS Performed at [facility], [address] [place], [place] [address]    Report  Status 05/27/2018 FINAL  Final  Culture, blood (Routine X 2) w Reflex to ID Panel     Status: None   Collection Time: 05/22/18  3:28 PM  Result Value Ref Range Status   Specimen Description   Final    BLOOD LEFT HAND Performed at [facility], [address], [place], [place] [address]    Special Requests   Final    BOTTLES DRAWN AEROBIC ONLY Blood Culture adequate volume Performed at [facility], [address] [place], [place] [address]    Culture   Final    NO GROWTH 5 DAYS Performed at [facility], [address], [place], [place] [address]    Report Status 05/27/2018 FINAL  Final     Radiology Studies: Dg Foot 2 Views Left  Result Date: 05/26/2018 CLINICAL DATA:  Diabetic with bilateral foot pain for 1 year. EXAM: LEFT FOOT - 2 VIEW COMPARISON:  None. FINDINGS: The mineralization and alignment are normal. There is no evidence of acute fracture or dislocation. There are mild talonavicular degenerative changes. The joint spaces are otherwise preserved. No focal soft tissue swelling or foreign body identified. IMPRESSION: No acute findings.  Mild talonavicular degenerative changes. Electronically Signed   By: Carey Bullocks M.D.   On: 05/26/2018 15:37   Dg Foot 2 Views Right  Result Date: 05/26/2018 CLINICAL DATA:  Bilateral foot pain for 1 year.  Diabetes. EXAM: RIGHT FOOT - 2 VIEW COMPARISON:  None. FINDINGS: The mineralization and alignment are normal. There is no evidence of acute fracture or dislocation. The joint spaces are maintained. No focal soft tissue swelling or foreign bodies identified. IMPRESSION: Normal examination. Electronically Signed   By: Carey Bullocks M.D.   On: 05/26/2018 15:36    Scheduled Meds: . enoxaparin (LOVENOX) injection  40 mg Subcutaneous Q24H  . folic acid  1 mg Oral Daily  . hydrocerin   Topical Daily  . multivitamin with minerals  1 tablet Oral Daily  . predniSONE  40 mg Oral Q breakfast  .  thiamine  100 mg Oral Daily   Or  . thiamine  100 mg Intravenous Daily   Continuous Infusions: . ampicillin-sulbactam (UNASYN) IV 3 g (05/27/18 0519)     LOS: 7 days   Rickey Barbara, MD [facility] Pager On Amion  If 7PM-7AM, please contact night-coverage 05/27/2018, 9:45 AM

## 2018-05-28 LAB — COMPREHENSIVE METABOLIC PANEL
ALK PHOS: 56 U/L (ref 38–126)
ALT: 143 U/L — ABNORMAL HIGH (ref 0–44)
AST: 108 U/L — ABNORMAL HIGH (ref 15–41)
Albumin: 2.6 g/dL — ABNORMAL LOW (ref 3.5–5.0)
Anion gap: 7 (ref 5–15)
BUN: 11 mg/dL (ref 6–20)
CO2: 26 mmol/L (ref 22–32)
Calcium: 8.5 mg/dL — ABNORMAL LOW (ref 8.9–10.3)
Chloride: 101 mmol/L (ref 98–111)
Creatinine, Ser: 0.58 mg/dL — ABNORMAL LOW (ref 0.61–1.24)
GFR calc Af Amer: >60 mL/min (ref >60)
GFR calc non Af Amer: >60 mL/min (ref >60)
Glucose, Bld: 105 mg/dL — ABNORMAL HIGH (ref 70–99)
Potassium: 3.8 mmol/L (ref 3.5–5.1)
SODIUM: 134 mmol/L — AB (ref 135–145)
Total Bilirubin: 0.6 mg/dL (ref 0.3–1.2)
Total Protein: 6.7 g/dL (ref 6.5–8.1)

## 2018-05-28 LAB — MAGNESIUM: Magnesium: 2 mg/dL (ref 1.7–2.4)

## 2018-05-28 MED ORDER — PREDNISONE 10 MG PO TABS: ORAL_TABLET | ORAL | Status: DC

## 2018-05-28 MED ORDER — IBUPROFEN 400 MG PO TABS: 400.0000 mg | ORAL_TABLET | Freq: Three times a day (TID) | ORAL | 0 refills | Status: DC | PRN

## 2018-05-28 MED ORDER — METOPROLOL TARTRATE 25 MG PO TABS: 25.0000 mg | ORAL_TABLET | Freq: Two times a day (BID) | ORAL | 0 refills | Status: DC

## 2018-05-28 MED ORDER — METOPROLOL TARTRATE 25 MG PO TABS
25.0000 mg | ORAL_TABLET | Freq: Two times a day (BID) | ORAL | Status: DC
  Administered 2018-05-28: 25 mg via ORAL
  Filled 2018-05-28: qty 1

## 2018-05-28 NOTE — [place] FL2 (Signed)
[place] MEDICAID FL2 LEVEL OF CARE SCREENING TOOL     IDENTIFICATION  Patient Name: Jake Nguyen Birthdate: March 31, 1966 Sex: male Admission Date (Current Location): 05/20/2018  [facility] and [place] Number:  Producer, television/film/video and Address:  [facility],  [address] [place], [place] [address]      Provider Number: [id number]  Attending Physician Name and Address:  Jerald Kief, MD  Relative Name and Phone Number:       Current Level of Care: Hospital Recommended Level of Care: Skilled Nursing Facility Prior Approval Number:    Date Approved/Denied:   PASRR Number: [id number]  Discharge Plan: SNF    Current Diagnoses: Patient Active Problem List   Diagnosis Date Noted  . Pressure injury of skin 05/25/2018  . Rhabdomyolysis 05/20/2018  . Hyponatremia 05/20/2018  . Elevated troponin 05/20/2018  . Moderate dehydration 05/20/2018  . Alcohol use 05/20/2018  . Influenza A 05/20/2018  . Transaminitis 05/20/2018    Orientation RESPIRATION BLADDER Height & Weight     Self, Time, Situation, Place  Normal Continent Weight: 154 lb 8.7 oz (70.1 kg) Height:  5\' 8"  (172.7 cm)  BEHAVIORAL SYMPTOMS/MOOD NEUROLOGICAL BOWEL NUTRITION STATUS      Continent Diet(Regular )  AMBULATORY STATUS COMMUNICATION OF NEEDS Skin   Extensive Assist Verbally Normal                       Personal Care Assistance Level of Assistance  Bathing, Feeding, Dressing Bathing Assistance: Limited assistance Feeding assistance: Independent Dressing Assistance: Limited assistance     Functional Limitations Info  Sight, Hearing, Speech Sight Info: Adequate Hearing Info: Adequate Speech Info: Adequate    SPECIAL CARE FACTORS FREQUENCY  PT (By licensed PT), OT (By licensed OT)     PT Frequency: 5x/week OT Frequency: 5x/week            Contractures Contractures Info: Not present    Additional Factors Info  Code Status, Allergies, Psychotropic Code Status Info:  Fullcode  Allergies Info: Allergies: Pork-derived Products           Current Medications (05/28/2018):  This is the current hospital active medication list Current Facility-Administered Medications  Medication Dose Route Frequency Provider Last Rate Last Dose  . alum & mag hydroxide-simeth (MAALOX/MYLANTA) 200-200-20 MG/5ML suspension 30 mL  30 mL Oral Q4H PRN Amin, Ankit Chirag, MD      . bisacodyl (DULCOLAX) EC tablet 5 mg  5 mg Oral Daily PRN Amin, Ankit Chirag, MD      . enoxaparin (LOVENOX) injection 40 mg  40 mg Subcutaneous Q24H Amin, Ankit Chirag, MD   40 mg at 05/27/18 1704  . folic acid (FOLVITE) tablet 1 mg  1 mg Oral Daily Amin, Ankit Chirag, MD   1 mg at 05/28/18 1020  . guaiFENesin-dextromethorphan (ROBITUSSIN DM) 100-10 MG/5ML syrup 5 mL  5 mL Oral Q4H PRN Amin, Ankit Chirag, MD      . hydrocerin (EUCERIN) cream   Topical Daily Amin, Ankit Chirag, MD      . hydrocortisone (ANUSOL-HC) 2.5 % rectal cream 1 application  1 application Topical QID PRN Amin, Ankit Chirag, MD      . hydrocortisone cream 1 % 1 application  1 application Topical TID PRN Amin, Ankit Chirag, MD      . ibuprofen (ADVIL,MOTRIN) tablet 400 mg  400 mg Oral TID PRN Dimple Nanas, MD   400 mg at 05/22/18 1555  . lip balm (CARMEX) ointment  1 application  1 application Topical PRN Amin, Ankit Chirag, MD      . loratadine (CLARITIN) tablet 10 mg  10 mg Oral Daily PRN Amin, Loura Halt, MD   10 mg at 05/22/18 1708  . metoprolol tartrate (LOPRESSOR) tablet 25 mg  25 mg Oral BID Jerald Kief, MD   25 mg at 05/28/18 1020  . multivitamin with minerals tablet 1 tablet  1 tablet Oral Daily Amin, Ankit Chirag, MD   1 tablet at 05/28/18 1020  . MUSCLE RUB CREA 1 application  1 application Topical PRN Amin, Ankit Chirag, MD      . ondansetron (ZOFRAN) tablet 4 mg  4 mg Oral Q6H PRN Amin, Ankit Chirag, MD       Or  . ondansetron (ZOFRAN) injection 4 mg  4 mg Intravenous Q6H PRN Amin, Ankit Chirag, MD      . phenol  (CHLORASEPTIC) mouth spray 1 spray  1 spray Mouth/Throat PRN Amin, Ankit Chirag, MD      . polyvinyl alcohol (LIQUIFILM TEARS) 1.4 % ophthalmic solution 1 drop  1 drop Both Eyes PRN Amin, Ankit Chirag, MD      . predniSONE (DELTASONE) tablet 40 mg  40 mg Oral Q breakfast Jerald Kief, MD   40 mg at 05/28/18 1020  . senna-docusate (Senokot-S) tablet 1 tablet  1 tablet Oral QHS PRN Amin, Ankit Chirag, MD      . sodium chloride (OCEAN) 0.65 % nasal spray 1 spray  1 spray Each Nare PRN Amin, Loura Halt, MD   1 spray at 05/22/18 1701  . thiamine (VITAMIN B-1) tablet 100 mg  100 mg Oral Daily Amin, Ankit Chirag, MD   100 mg at 05/28/18 1020   Or  . thiamine (B-1) injection 100 mg  100 mg Intravenous Daily Amin, Ankit Chirag, MD      . traZODone (DESYREL) tablet 100 mg  100 mg Oral QHS PRN Jerald Kief, MD   100 mg at 05/27/18 2102     Discharge Medications: Please see discharge summary for a list of discharge medications.  Relevant Imaging Results:  Relevant Lab Results:   Additional Information [id number]  Clearance Coots, LCSW

## 2018-05-28 NOTE — Progress Notes (Signed)
Report called to Cowen at [facility].  All questions answered.  VSS.

## 2018-05-28 NOTE — Progress Notes (Signed)
Occupational Therapy Treatment Patient Details Name: Jake Nguyen MRN: [id number] DOB: [birth year] Today's Date: 05/28/2018    History of present illness 53 y.o. male with medical history significant of alcohol abuse but otherwise limited medical history was brought to the hospital for evaluation of change in mental status and unresponsiveness likely secondary to alcohol intoxication.   Pt fournd to have acute rhabdomyolysis, Bacteremia-Acinetobacter, and influenza A positive.   OT comments  PATIENT REQUIRED CUES FOR SAFETY WITH ADLS AND ADLS MOBILITY. PATIENT IS IMPULSIVE AND REQUIRES CUES TO SLOW DOWN WITH AMB. PATIENT WAS S TO MIN A WITH ADLS AND MOBILITY. ACUTE OT TO FOLLOW.   Follow Up Recommendations       Equipment Recommendations       Recommendations for Other Services      Precautions / Restrictions Precautions Precautions: Fall Restrictions Weight Bearing Restrictions: No       Mobility Bed Mobility Overal bed mobility: Needs Assistance Bed Mobility: Supine to Sit;Sit to Supine     Supine to sit: Supervision Sit to supine: Supervision   General bed mobility comments: PATIENT IS S WITH DECREASED SAFETY.  Transfers     Transfers: Stand Pivot Transfers Sit to Stand: Min assist Stand pivot transfers: Min assist            Balance                                           ADL either performed or assessed with clinical judgement   ADL       Grooming: Wash/dry hands;Wash/dry face;Oral care;Supervision/safety;Standing           Upper Body Dressing : Supervision/safety;Set up;Sitting   Lower Body Dressing: Min guard;Sit to/from stand   Toilet Transfer: Minimal assistance   Toileting- Architect and Hygiene: Min guard       Functional mobility during ADLs: Minimal assistance;Rolling walker General ADL Comments: PATIENT PERFORMED ADLS SITTING EOB AT S TO MIN GUARD ASSIST LEVEL. PATIENT AMB TO BATHROOM AND STOOD  AT [facility] AT S LEVEL. PATIIENT WAS MIN A WITH AMB AND TRANSFERS WITH PATIENT HAVING DECREASED BALANCE.      Vision       Perception     Praxis      Cognition Arousal/Alertness: Awake/alert Behavior During Therapy: Impulsive Overall Cognitive Status: No family/caregiver present to determine baseline cognitive functioning                                          Exercises     Shoulder Instructions       General Comments      Pertinent Vitals/ Pain       Pain Assessment: No/denies pain(PATIENT STATES HIS FEET ARE TINGELING )  Home Living                                          Prior Functioning/Environment              Frequency  Min 2X/week        Progress Toward Goals  OT Goals(current goals can now be found in the care plan section)  Progress towards OT goals: Progressing toward goals  Plan Discharge plan remains appropriate    Co-evaluation                 AM-PAC OT "6 Clicks" Daily Activity     Outcome Measure   Help from another person eating meals?: None Help from another person taking care of personal grooming?: A Little Help from another person toileting, which includes using toliet, bedpan, or urinal?: A Little Help from another person bathing (including washing, rinsing, drying)?: A Little Help from another person to put on and taking off regular upper body clothing?: A Little Help from another person to put on and taking off regular lower body clothing?: A Little 6 Click Score: 19    End of Session Equipment Utilized During Treatment: Rolling walker  OT Visit Diagnosis: Unsteadiness on feet (R26.81);Repeated falls (R29.6)   Activity Tolerance Patient limited by fatigue   Patient Left in bed;with call bell/phone within reach;with bed alarm set   Nurse Communication ([facility])        Time: 3329-5188 OT Time Calculation (min): 38 min  Charges: OT General Charges $OT Visit: 1  Visit OT Treatments $Self Care/Home Management : 38-52 mins  6 CLICKS.   Renay Crammer 05/28/2018, 9:08 AM

## 2018-05-28 NOTE — Progress Notes (Signed)
Physical Therapy Treatment Patient Details Name: Jake Nguyen MRN: [id number] DOB: [birth year] Today's Date: 05/28/2018    History of Present Illness 53 y.o. male with medical history significant of alcohol abuse but otherwise limited medical history was brought to the hospital for evaluation of change in mental status and unresponsiveness likely secondary to alcohol intoxication.   Pt fournd to have acute rhabdomyolysis, Bacteremia-Acinetobacter, and influenza A positive.    PT Comments    Pt progressing, incr activity tolerance however he continues to demonstrate decr balance, fatigue quickly and is at risk for falls. Continue to recommend STSNF post acute. Will follow in acute setting.   Follow Up Recommendations  SNF     Equipment Recommendations  Rolling walker with 5" wheels    Recommendations for Other Services       Precautions / Restrictions Precautions Precautions: Fall Restrictions Weight Bearing Restrictions: No    Mobility  Bed Mobility   Bed Mobility: Supine to Sit;Sit to Supine     Supine to sit: Supervision Sit to supine: Supervision   General bed mobility comments: no phsyical assist, no use of rails, bed flat, supervision for safety   Transfers Overall transfer level: Needs assistance Equipment used: Rolling walker (2 wheeled)   Sit to Stand: Min guard         General transfer comment: light steadying assist to rise, cues for hand placement  Ambulation/Gait Ambulation/Gait assistance: Min guard;Min assist Gait Distance (Feet): (hallway ambulation ) Assistive device: Rolling walker (2 wheeled);1 person hand held assist Gait Pattern/deviations: Step-through pattern;Decreased stride length;Wide base of support     General Gait Details: amb ~ 36' with RW and min assist,  amb further distance without device, intermittent HHA. LOB x3 requiring min assist to recover   Stairs             Wheelchair Mobility    Modified Rankin (Stroke  Patients Only)       Balance             Standing balance-Leahy Scale: Fair                              Cognition Arousal/Alertness: Awake/alert Behavior During Therapy: WFL for tasks assessed/performed Overall Cognitive Status: Within Functional Limits for tasks assessed                                        Exercises      General Comments        Pertinent Vitals/Pain Pain Assessment: No/denies pain    Home Living                      Prior Function            PT Goals (current goals can now be found in the care plan section) Acute Rehab PT Goals Patient Stated Goal: not stated PT Goal Formulation: With patient Time For Goal Achievement: 06/07/18 Potential to Achieve Goals: Good Progress towards PT goals: Progressing toward goals    Frequency    Min 2X/week      PT Plan Current plan remains appropriate    Co-evaluation              AM-PAC PT "6 Clicks" Mobility   Outcome Measure  Help needed turning from your back to your side while in a  flat bed without using bedrails?: None Help needed moving from lying on your back to sitting on the side of a flat bed without using bedrails?: None Help needed moving to and from a bed to a chair (including a wheelchair)?: A Little Help needed standing up from a chair using your arms (e.g., wheelchair or bedside chair)?: A Little Help needed to walk in hospital room?: A Little Help needed climbing 3-5 steps with a railing? : A Little 6 Click Score: 20    End of Session Equipment Utilized During Treatment: Gait belt Activity Tolerance: Patient tolerated treatment well Patient left: in bed;with call bell/phone within reach;with bed alarm set   PT Visit Diagnosis: Other abnormalities of gait and mobility (R26.89)     Time: 1423-9532 PT Time Calculation (min) (ACUTE ONLY): 17 min  Charges:  $Gait Training: 8-22 mins                     Drucilla Chalet,  PT  Pager: [phone] Acute Rehab Dept [facility]): [phone]   05/28/2018    [facility] 05/28/2018, 1:37 PM

## 2018-05-28 NOTE — Discharge Summary (Signed)
Physician Discharge Summary  Jake Nguyen:[id number] DOB: 06/19/1965 DOA: 05/20/2018  PCP: Patient, No Pcp Per  Admit date: 05/20/2018 Discharge date: 05/28/2018  Admitted From: Home Disposition:  SNF  Recommendations for Outpatient Follow-up:  1. Follow up with PCP in 1-2 weeks  Discharge Condition:Improved CODE STATUS:Full Diet recommendation: Regular   Brief/Interim Summary: 53 y.o.malewith medical history significant ofalcohol abuse but otherwise limited medical history was brought to the hospital for evaluation of change in mental status and unresponsiveness likely secondary to alcohol intoxication. History was limited due to his mental status. Upon admission he was found to be hyponatremic, and rhabdomyolysis with elevated lactate level. His LFTs were elevated as well. He was positive for influenza.He was febrile during the hospitalization and blood cultures ended up growing Acinetobacter and 1 bottle staph aureus with methicillin resistance. Patient started on imipenem and vancomycin.   Discharge Diagnoses:  Principal Problem:   Rhabdomyolysis Active Problems:   Hyponatremia   Elevated troponin   Moderate dehydration   Alcohol use   Influenza A   Transaminitis   Pressure injury of skin   Acute metabolic encephalopathy secondary to alcohol intoxication, improving Acute rhabdomyolysis,worsening Moderate dehydration with elevated anion gap,resolved -CK has trended down with IVF hydration -CK has since trended down  Bacteremia-Acinetobacter. -Blood cultures are positive for Acinetobacter. Also positive for what appears to be MRSA just in 1 bottle. Due to his fevers overnight, he was started empirically  on vancomycin and imipenem. Earlier discussed with ID.  -Completed course of Unasyn today for total of 5 days of tx -Reviewed cultures noted to be neg   Hypokalemia -replaced -Potassium normal  Hyponatremia, secondary to hypovolemia -Improved with  hydration -sodium within normal limits  Elevated troponin -Trended down. Unlikely ACS.  - Remains chest pain free  Transaminitis Alcohol use -LFTs trending down -Alcohol withdrawal protocol in place. Folic acid, thiamine and multivitamins ordered. -Continue to follow LFT's  Influenza A positive -Completed 5 days ofTamiflu. Supportive care. -afebrile and asymptomatic  Thrombocytopenia -Likely secondary to alcohol use. -normalized  B foot pain -Recent BLE swelling and pain -improved with steroids given -Suspect gout in setting of known significant ETOH abuse -Ordered and reviewed B foot xrays, found to be unremarkable -tolerating prednisone -PT/OT eval with recs for SNF   Discharge Instructions   Allergies as of 05/28/2018      Reactions   Pork-derived Products Other (See Comments)   Pt states he doesn't eat pork      Medication List    TAKE these medications   ibuprofen 400 MG tablet Commonly known as:  ADVIL,MOTRIN Take 1 tablet (400 mg total) by mouth 3 (three) times daily as needed for fever.   metoprolol tartrate 25 MG tablet Commonly known as:  LOPRESSOR Take 1 tablet (25 mg total) by mouth 2 (two) times daily for 30 days.   predniSONE 10 MG tablet Commonly known as:  DELTASONE Taper dose: 40mg  po x 2 days, then 20mg  po daily x 2 days, then 10mg  po daily x 2 days, then 5mg  po daily x 2 days, then stop. Zero refills      Contact information for after-discharge care    Destination    [facility] .   Service:  Skilled Nursing Contact information: [address] [place] [address] [phone]             Allergies  Allergen Reactions  . Pork-Derived Products Other (See Comments)    Pt states he  doesn't eat pork    Procedures/Studies: Dg Chest 1 View  Result Date: 05/20/2018 CLINICAL DATA:  Patient with failure to thrive. EXAM: CHEST  1 VIEW COMPARISON:  None. FINDINGS: Monitoring leads  overlie the patient. Normal cardiac and mediastinal contours. No consolidative pulmonary opacities. No pleural effusion or pneumothorax. IMPRESSION: No acute cardiopulmonary process. Electronically Signed   By: Annia Belt M.D.   On: 05/20/2018 15:53   Ct Head Wo Contrast  Result Date: 05/20/2018 CLINICAL DATA:  Altered mental status, ETOH abuse, failure to thrive EXAM: CT HEAD WITHOUT CONTRAST TECHNIQUE: Contiguous axial images were obtained from the base of the skull through the vertex without intravenous contrast. COMPARISON:  10/27/2017 FINDINGS: Brain: No evidence of acute infarction, hemorrhage, hydrocephalus, extra-axial collection or mass lesion/mass effect. Cortical volume is likely decreased for age. Vascular: No hyperdense vessel or unexpected calcification. Skull: Normal. Negative for fracture or focal lesion. Sinuses/Orbits: Mild paranasal sinus opacification, maxillary and ethmoid predominant. Mastoid air cells are clear. Other: None. IMPRESSION: Normal head CT. Electronically Signed   By: Charline Bills M.D.   On: 05/20/2018 16:45   Dg Foot 2 Views Left  Result Date: 05/26/2018 CLINICAL DATA:  Diabetic with bilateral foot pain for 1 year. EXAM: LEFT FOOT - 2 VIEW COMPARISON:  None. FINDINGS: The mineralization and alignment are normal. There is no evidence of acute fracture or dislocation. There are mild talonavicular degenerative changes. The joint spaces are otherwise preserved. No focal soft tissue swelling or foreign body identified. IMPRESSION: No acute findings.  Mild talonavicular degenerative changes. Electronically Signed   By: Carey Bullocks M.D.   On: 05/26/2018 15:37   Dg Foot 2 Views Right  Result Date: 05/26/2018 CLINICAL DATA:  Bilateral foot pain for 1 year.  Diabetes. EXAM: RIGHT FOOT - 2 VIEW COMPARISON:  None. FINDINGS: The mineralization and alignment are normal. There is no evidence of acute fracture or dislocation. The joint spaces are maintained. No focal soft  tissue swelling or foreign bodies identified. IMPRESSION: Normal examination. Electronically Signed   By: Carey Bullocks M.D.   On: 05/26/2018 15:36     Subjective: Eager to start therapy  Discharge Exam: Vitals:   05/28/18 0456 05/28/18 1330  BP: 131/89 129/79  Pulse: 68 72  Resp: 18 18  Temp: 98.8 F (37.1 C) 98.7 F (37.1 C)  SpO2: 95% 97%   Vitals:   05/27/18 2056 05/28/18 0400 05/28/18 0456 05/28/18 1330  BP: 115/82  131/89 129/79  Pulse: 68  68 72  Resp: 16  18 18   Temp: 98.9 F (37.2 C)  98.8 F (37.1 C) 98.7 F (37.1 C)  TempSrc: Oral  Oral Oral  SpO2: 96%  95% 97%  Weight:  70.1 kg    Height:        General: Pt is alert, awake, not in acute distress Cardiovascular: RRR, S1/S2 +, no rubs, no gallops Respiratory: CTA bilaterally, no wheezing, no rhonchi Abdominal: Soft, NT, ND, bowel sounds + Extremities: no edema, no cyanosis   The results of significant diagnostics from this hospitalization (including imaging, microbiology, ancillary and laboratory) are listed below for reference.     Microbiology: Recent Results (from the past 240 hour(s))  Urine culture     Status: Abnormal   Collection Time: 05/20/18  2:58 PM  Result Value Ref Range Status   Specimen Description   Final    URINE, RANDOM Performed at [facility], [address], [place], [place] [address]    Special Requests  Final    NONE Performed at [facility], [address] [place], [place] [address]    Culture MULTIPLE SPECIES PRESENT, SUGGEST RECOLLECTION (A)  Final   Report Status 05/22/2018 FINAL  Final  MRSA PCR Screening     Status: None   Collection Time: 05/20/18  6:28 PM  Result Value Ref Range Status   MRSA by PCR NEGATIVE NEGATIVE Final    Comment:        The GeneXpert MRSA Assay (FDA approved for NASAL specimens only), is one component of a comprehensive MRSA colonization surveillance program. It is not intended to diagnose  MRSA infection nor to guide or monitor treatment for MRSA infections. Performed at [facility], [address] [place], [place] [address]   Culture, blood (Routine X 2) w Reflex to ID Panel     Status: Abnormal   Collection Time: 05/20/18  7:23 PM  Result Value Ref Range Status   Specimen Description   Final    BLOOD BLOOD RIGHT HAND Performed at [facility], [address] [place], [place] [address]    Special Requests   Final    BOTTLES DRAWN AEROBIC ONLY Blood Culture adequate volume Performed at [facility], [address], [place], [place] [address]    Culture  Setup Time   Final    GRAM POSITIVE COCCI GRAM NEGATIVE COCCOBACILLI AEROBIC BOTTLE ONLY CRITICAL RESULT CALLED TO, READ BACK BY AND VERIFIED WITH: PHARMD Avitabile, E [phone] FCP    Culture (A)  Final    AEROCOCCUS VIRIDANS ACINETOBACTER CALCOACETICUS/BAUMANNII COMPLEX STAPHYLOCOCCUS SPECIES (COAGULASE NEGATIVE) THE SIGNIFICANCE OF ISOLATING THIS ORGANISM FROM A SINGLE SET OF BLOOD CULTURES WHEN MULTIPLE SETS ARE DRAWN IS UNCERTAIN. PLEASE NOTIFY THE MICROBIOLOGY DEPARTMENT WITHIN ONE WEEK IF SPECIATION AND SENSITIVITIES ARE REQUIRED. Performed at [facility], [address] [place], [place] [address]    Report Status 05/23/2018 FINAL  Final   Organism ID, Bacteria ACINETOBACTER CALCOACETICUS/BAUMANNII COMPLEX  Final      Susceptibility   Acinetobacter calcoaceticus/baumannii complex - MIC*    CEFTAZIDIME 4 SENSITIVE Sensitive     CEFTRIAXONE 16 INTERMEDIATE Intermediate     CIPROFLOXACIN <=0.25 SENSITIVE Sensitive     GENTAMICIN <=1 SENSITIVE Sensitive     IMIPENEM <=0.25 SENSITIVE Sensitive     PIP/TAZO 8 SENSITIVE Sensitive     TRIMETH/SULFA <=20 SENSITIVE Sensitive     CEFEPIME 2 SENSITIVE Sensitive     AMPICILLIN/SULBACTAM <=2 SENSITIVE Sensitive     * ACINETOBACTER CALCOACETICUS/BAUMANNII COMPLEX  Culture, blood (Routine X 2) w  Reflex to ID Panel     Status: None   Collection Time: 05/20/18  7:23 PM  Result Value Ref Range Status   Specimen Description   Final    BLOOD RIGHT ANTECUBITAL Performed at [facility], [address] [place], [place] [address]    Special Requests   Final    BOTTLES DRAWN AEROBIC ONLY Blood Culture adequate volume Performed at [facility], [address], [place], [place] [address]    Culture   Final    NO GROWTH 5 DAYS Performed at [facility], [address], [place], [place] [address]    Report Status 05/25/2018 FINAL  Final  Blood Culture ID Panel (Reflexed)     Status: Abnormal   Collection Time: 05/20/18  7:23 PM  Result Value Ref Range Status   Enterococcus species NOT DETECTED NOT DETECTED Final  Listeria monocytogenes NOT DETECTED NOT DETECTED Final   Staphylococcus species DETECTED (A) NOT DETECTED Final    Comment: Methicillin (oxacillin) resistant coagulase negative staphylococcus. Possible blood culture contaminant (unless isolated from more than one blood culture draw or clinical case suggests pathogenicity). No antibiotic treatment is indicated for blood  culture contaminants. CRITICAL RESULT CALLED TO, READ BACK BY AND VERIFIED WITH: PHARMD Pronovost, E [phone] FCP    Staphylococcus aureus (BCID) NOT DETECTED NOT DETECTED Final   Methicillin resistance DETECTED (A) NOT DETECTED Final    Comment: CRITICAL RESULT CALLED TO, READ BACK BY AND VERIFIED WITH: PHARMD Ziebarth, E [phone] FCP    Streptococcus species NOT DETECTED NOT DETECTED Final   Streptococcus agalactiae NOT DETECTED NOT DETECTED Final   Streptococcus pneumoniae NOT DETECTED NOT DETECTED Final   Streptococcus pyogenes NOT DETECTED NOT DETECTED Final   Acinetobacter baumannii DETECTED (A) NOT DETECTED Final    Comment: CRITICAL RESULT CALLED TO, READ BACK BY AND VERIFIED WITH: PHARMD Ohms, E [phone] FCP    Enterobacteriaceae species NOT  DETECTED NOT DETECTED Final   Enterobacter cloacae complex NOT DETECTED NOT DETECTED Final   Escherichia coli NOT DETECTED NOT DETECTED Final   Klebsiella oxytoca NOT DETECTED NOT DETECTED Final   Klebsiella pneumoniae NOT DETECTED NOT DETECTED Final   Proteus species NOT DETECTED NOT DETECTED Final   Serratia marcescens NOT DETECTED NOT DETECTED Final   Carbapenem resistance NOT DETECTED NOT DETECTED Final   Haemophilus influenzae NOT DETECTED NOT DETECTED Final   Neisseria meningitidis NOT DETECTED NOT DETECTED Final   Pseudomonas aeruginosa NOT DETECTED NOT DETECTED Final   Candida albicans NOT DETECTED NOT DETECTED Final   Candida glabrata NOT DETECTED NOT DETECTED Final   Candida krusei NOT DETECTED NOT DETECTED Final   Candida parapsilosis NOT DETECTED NOT DETECTED Final   Candida tropicalis NOT DETECTED NOT DETECTED Final  Culture, blood (Routine X 2) w Reflex to ID Panel     Status: None   Collection Time: 05/22/18  3:27 PM  Result Value Ref Range Status   Specimen Description   Final    BLOOD LEFT ANTECUBITAL Performed at [facility], [address], [place], [place] [address]    Special Requests   Final    BOTTLES DRAWN AEROBIC AND ANAEROBIC Blood Culture adequate volume Performed at [facility], [address], [place], [place] [address]    Culture   Final    NO GROWTH 5 DAYS Performed at [facility], [address], [place], [place] [address]    Report Status 05/27/2018 FINAL  Final  Culture, blood (Routine X 2) w Reflex to ID Panel     Status: None   Collection Time: 05/22/18  3:28 PM  Result Value Ref Range Status   Specimen Description   Final    BLOOD LEFT HAND Performed at [facility], [address], [place], [place] [address]    Special Requests   Final    BOTTLES DRAWN AEROBIC ONLY Blood Culture adequate volume Performed at [facility], [address] [place],  [place] [address]    Culture   Final    NO GROWTH 5 DAYS Performed at [facility], [address] [place], [place] [address]    Report Status 05/27/2018 FINAL  Final     Labs: BNP (last 3 results) No results for input(s): BNP in the last 8760 hours. Basic Metabolic Panel: Recent Labs  Lab 05/22/18 0508  05/23/18 0413 05/24/18 0357 05/25/18 0357 05/26/18 0415 05/27/18 0457 05/28/18 0429  NA 132*   < > 134* 135 134* 138 135 134*  K 2.8*   < > 2.8* 3.7 3.4* 3.6 4.0 3.8  CL 95*   < > 99 103 104 106 102 101  CO2 25   < > 25 26 25 26 26 26   GLUCOSE 93   < > 139* 115* 108* 121* 132* 105*  BUN 6   < > <5* 7 9 9 8 11   CREATININE 0.53*   < > 0.57* 0.55* 0.56* 0.50* 0.55* 0.58*  CALCIUM 7.8*   < > 7.6* 8.1* 8.1* 8.1* 8.4* 8.5*  MG 2.0  --  1.7 1.9 1.6*  --   --  2.0   < > = values in this interval not displayed.   Liver Function Tests: Recent Labs  Lab 05/23/18 0413 05/24/18 0357 05/25/18 0357 05/27/18 0457 05/28/18 0429  AST 503* 352* 261* 104* 108*  ALT 100* 99* 123* 142* 143*  ALKPHOS 39 48 56 57 56  BILITOT 0.6 0.8 0.5 0.7 0.6  PROT 6.1* 6.9 6.3* 7.1 6.7  ALBUMIN 2.3* 2.5* 2.4* 2.6* 2.6*   No results for input(s): LIPASE, AMYLASE in the last 168 hours. No results for input(s): AMMONIA in the last 168 hours. CBC: Recent Labs  Lab 05/22/18 0508 05/23/18 0413 05/24/18 0357 05/25/18 0357  WBC 8.5 8.3 10.1 11.0*  HGB 11.3* 10.0* 10.5* 10.0*  HCT 35.3* 31.0* 32.6* 31.6*  MCV 90.7 88.3 90.6 91.1  PLT 124* 181 255 327   Cardiac Enzymes: Recent Labs  Lab 05/22/18 1527 05/23/18 0413 05/24/18 0357 05/25/18 0357 05/26/18 0415  CKTOTAL 17,441* 11,546* 6,266* 2,852* 1,699*   BNP: Invalid input(s): POCBNP CBG: Recent Labs  Lab 05/24/18 1120 05/24/18 1614 05/24/18 2256 05/25/18 0740 05/25/18 1148  GLUCAP 174* 142* 149* 99 151*   D-Dimer No results for input(s): DDIMER in the last 72 hours. Hgb A1c No results for input(s): HGBA1C in the last 72  hours. Lipid Profile No results for input(s): CHOL, HDL, LDLCALC, TRIG, CHOLHDL, LDLDIRECT in the last 72 hours. Thyroid function studies No results for input(s): TSH, T4TOTAL, T3FREE, THYROIDAB in the last 72 hours.  Invalid input(s): FREET3 Anemia work up No results for input(s): VITAMINB12, FOLATE, FERRITIN, TIBC, IRON, RETICCTPCT in the last 72 hours. Urinalysis    Component Value Date/Time   COLORURINE YELLOW 05/20/2018 1458   APPEARANCEUR CLEAR 05/20/2018 1458   LABSPEC 1.024 05/20/2018 1458   PHURINE 6.0 05/20/2018 1458   GLUCOSEU NEGATIVE 05/20/2018 1458   HGBUR LARGE (A) 05/20/2018 1458   BILIRUBINUR NEGATIVE 05/20/2018 1458   KETONESUR 80 (A) 05/20/2018 1458   PROTEINUR >=300 (A) 05/20/2018 1458   UROBILINOGEN 0.2 08/02/2009 2144   NITRITE NEGATIVE 05/20/2018 1458   LEUKOCYTESUR NEGATIVE 05/20/2018 1458   Sepsis Labs Invalid input(s): PROCALCITONIN,  WBC,  LACTICIDVEN Microbiology Recent Results (from the past 240 hour(s))  Urine culture     Status: Abnormal   Collection Time: 05/20/18  2:58 PM  Result Value Ref Range Status   Specimen Description   Final    URINE, RANDOM Performed at [facility], [address] [place], [place] [id number]    Special Requests   Final    NONE Performed at [facility], [address] [place], [place] [id number]    Culture MULTIPLE SPECIES PRESENT, SUGGEST RECOLLECTION (A)  Final   Report Status 05/22/2018 FINAL  Final  MRSA PCR Screening     Status: None   Collection Time: 05/20/18  6:28 PM  Result Value Ref Range Status   MRSA by PCR NEGATIVE NEGATIVE Final    Comment:        The GeneXpert MRSA Assay (FDA approved for NASAL specimens only), is one component of a comprehensive MRSA colonization surveillance program. It is not intended to diagnose MRSA infection nor to guide or monitor treatment for MRSA infections. Performed at [facility], [address], [place], [place] [address]   Culture, blood (Routine X 2) w Reflex to ID Panel     Status: Abnormal   Collection Time: 05/20/18  7:23 PM  Result Value Ref Range Status   Specimen Description   Final    BLOOD BLOOD RIGHT HAND Performed at [facility], [address], [place], [place] [address]    Special Requests   Final    BOTTLES DRAWN AEROBIC ONLY Blood Culture adequate volume Performed at [facility], [address], [place], [place] [address]    Culture  Setup Time   Final    GRAM POSITIVE COCCI GRAM NEGATIVE COCCOBACILLI AEROBIC BOTTLE ONLY CRITICAL RESULT CALLED TO, READ BACK BY AND VERIFIED WITH: PHARMD Lorensen, E [phone] FCP    Culture (A)  Final    AEROCOCCUS VIRIDANS ACINETOBACTER CALCOACETICUS/BAUMANNII COMPLEX STAPHYLOCOCCUS SPECIES (COAGULASE NEGATIVE) THE SIGNIFICANCE OF ISOLATING THIS ORGANISM FROM A SINGLE SET OF BLOOD CULTURES WHEN MULTIPLE SETS ARE DRAWN IS UNCERTAIN. PLEASE NOTIFY THE MICROBIOLOGY DEPARTMENT WITHIN ONE WEEK IF SPECIATION AND SENSITIVITIES ARE REQUIRED. Performed at [facility], [address] [place], [place] [address]    Report Status 05/23/2018 FINAL  Final   Organism ID, Bacteria ACINETOBACTER CALCOACETICUS/BAUMANNII COMPLEX  Final      Susceptibility   Acinetobacter calcoaceticus/baumannii complex - MIC*    CEFTAZIDIME 4 SENSITIVE Sensitive     CEFTRIAXONE 16 INTERMEDIATE Intermediate     CIPROFLOXACIN <=0.25 SENSITIVE Sensitive     GENTAMICIN <=1 SENSITIVE Sensitive     IMIPENEM <=0.25 SENSITIVE Sensitive     PIP/TAZO 8 SENSITIVE Sensitive     TRIMETH/SULFA <=20 SENSITIVE Sensitive     CEFEPIME 2 SENSITIVE Sensitive     AMPICILLIN/SULBACTAM <=2 SENSITIVE Sensitive     * ACINETOBACTER CALCOACETICUS/BAUMANNII COMPLEX  Culture, blood (Routine X 2) w Reflex to ID Panel     Status: None   Collection Time: 05/20/18  7:23 PM  Result Value Ref Range Status   Specimen Description   Final     BLOOD RIGHT ANTECUBITAL Performed at [facility], [address], [place], [place] [address]    Special Requests   Final    BOTTLES DRAWN AEROBIC ONLY Blood Culture adequate volume Performed at [facility], [address], [place], [place] [address]    Culture   Final    NO GROWTH 5 DAYS Performed at [facility], [address] [place] [address] [place], [place] [address]    Report Status 05/25/2018 FINAL  Final  Blood Culture ID Panel (Reflexed)     Status: Abnormal   Collection Time: 05/20/18  7:23 PM  Result Value Ref Range Status   Enterococcus species NOT DETECTED NOT DETECTED Final   Listeria monocytogenes NOT DETECTED NOT DETECTED Final   Staphylococcus species DETECTED (A) NOT DETECTED Final    Comment: Methicillin (oxacillin) resistant coagulase negative staphylococcus. Possible blood culture contaminant (unless isolated from more than one  blood culture draw or clinical case suggests pathogenicity). No antibiotic treatment is indicated for blood  culture contaminants. CRITICAL RESULT CALLED TO, READ BACK BY AND VERIFIED WITH: PHARMD Steinhilber, E [phone] FCP    Staphylococcus aureus (BCID) NOT DETECTED NOT DETECTED Final   Methicillin resistance DETECTED (A) NOT DETECTED Final    Comment: CRITICAL RESULT CALLED TO, READ BACK BY AND VERIFIED WITH: PHARMD Mccaskey, E [phone] FCP    Streptococcus species NOT DETECTED NOT DETECTED Final   Streptococcus agalactiae NOT DETECTED NOT DETECTED Final   Streptococcus pneumoniae NOT DETECTED NOT DETECTED Final   Streptococcus pyogenes NOT DETECTED NOT DETECTED Final   Acinetobacter baumannii DETECTED (A) NOT DETECTED Final    Comment: CRITICAL RESULT CALLED TO, READ BACK BY AND VERIFIED WITH: PHARMD Holst, E [phone] FCP    Enterobacteriaceae species NOT DETECTED NOT DETECTED Final   Enterobacter cloacae complex NOT DETECTED NOT DETECTED Final   Escherichia coli NOT DETECTED NOT DETECTED Final    Klebsiella oxytoca NOT DETECTED NOT DETECTED Final   Klebsiella pneumoniae NOT DETECTED NOT DETECTED Final   Proteus species NOT DETECTED NOT DETECTED Final   Serratia marcescens NOT DETECTED NOT DETECTED Final   Carbapenem resistance NOT DETECTED NOT DETECTED Final   Haemophilus influenzae NOT DETECTED NOT DETECTED Final   Neisseria meningitidis NOT DETECTED NOT DETECTED Final   Pseudomonas aeruginosa NOT DETECTED NOT DETECTED Final   Candida albicans NOT DETECTED NOT DETECTED Final   Candida glabrata NOT DETECTED NOT DETECTED Final   Candida krusei NOT DETECTED NOT DETECTED Final   Candida parapsilosis NOT DETECTED NOT DETECTED Final   Candida tropicalis NOT DETECTED NOT DETECTED Final  Culture, blood (Routine X 2) w Reflex to ID Panel     Status: None   Collection Time: 05/22/18  3:27 PM  Result Value Ref Range Status   Specimen Description   Final    BLOOD LEFT ANTECUBITAL Performed at [facility], [address], [place], [place] [address]    Special Requests   Final    BOTTLES DRAWN AEROBIC AND ANAEROBIC Blood Culture adequate volume Performed at [facility], [address] [place], [place] [address]    Culture   Final    NO GROWTH 5 DAYS Performed at [facility], [address], [place], [place] [address]    Report Status 05/27/2018 FINAL  Final  Culture, blood (Routine X 2) w Reflex to ID Panel     Status: None   Collection Time: 05/22/18  3:28 PM  Result Value Ref Range Status   Specimen Description   Final    BLOOD LEFT HAND Performed at [facility], [address], [place], [place] [address]    Special Requests   Final    BOTTLES DRAWN AEROBIC ONLY Blood Culture adequate volume Performed at [facility], [address] [place], [place] [address]    Culture   Final    NO GROWTH 5 DAYS Performed at [facility], [address] [place], [place] [address]    Report  Status 05/27/2018 FINAL  Final   Time spent: 30 min  SIGNED:   Rickey Barbara, MD  [facility] 05/28/2018, 3:01 PM  If 7PM-7AM, please contact night-coverage

## 2018-05-28 NOTE — Clinical Social Work Placement (Addendum)
CSW leadership approved 2 week Letter of [place] for therapy at SNF. Patient reports understanding the plan.  Patient brother [name] aware and will complete paperwork [facility] to transport.  Nurse call report [phone] Room 113B   CLINICAL SOCIAL WORK PLACEMENT  NOTE  Date:  05/28/2018  Patient Details  Name: Jake Nguyen MRN: [id number] Date of Birth: January 07, 1966  Clinical Social Work is seeking post-discharge placement for this patient at the Skilled  Nursing Facility level of care (*CSW will initial, date and re-position this form in  chart as items are completed):      Patient/family provided with [facility] Clinical Social Work Department's list of facilities offering this level of care within the geographic area requested by the patient (or if unable, by the patient's family).      Patient/family informed of their freedom to choose among providers that offer the needed level of care, that participate in Medicare, Medicaid or managed care program needed by the patient, have an available bed and are willing to accept the patient.      Patient/family informed of 's ownership interest in [facility] and [facility], as well as of the fact that they are under no obligation to receive care at these facilities.  PASRR submitted to EDS on       PASRR number received on       Existing PASRR number confirmed on       FL2 transmitted to all facilities in geographic area requested by pt/family on       FL2 transmitted to all facilities within larger geographic area on 05/28/18     Patient informed that his/her managed care company has contracts with or will negotiate with certain facilities, including the following:        Yes   Patient/family informed of bed offers received.  Patient chooses bed at     [facility]   Physician recommends and patient chooses bed at      Patient to be transferred to   on 05/28/18.  Patient to be transferred to facility  by [facility]      Patient family notified on 05/28/18 of transfer.  Name of family member notified:     Brother- [name]  PHYSICIAN       Additional Comment:    _______________________________________________ Clearance Coots, LCSW 05/28/2018, 2:31 PM

## 2018-07-03 ENCOUNTER — Ambulatory Visit: Payer: Self-pay | Admitting: Family Medicine

## 2019-05-24 ENCOUNTER — Emergency Department (HOSPITAL_COMMUNITY)
Admission: EM | Admit: 2019-05-24 | Discharge: 2019-05-25 | Disposition: A | Discharge status: Home / Self Care | Payer: Self-pay | Attending: Emergency Medicine | Admitting: Emergency Medicine

## 2019-05-24 ENCOUNTER — Other Ambulatory Visit: Payer: Self-pay

## 2019-05-24 DIAGNOSIS — Y908 Blood alcohol level of 240 mg/100 ml or more: Secondary | ICD-10-CM | POA: Insufficient documentation

## 2019-05-24 DIAGNOSIS — R1013 Epigastric pain: Secondary | ICD-10-CM | POA: Insufficient documentation

## 2019-05-24 DIAGNOSIS — F10220 Alcohol dependence with intoxication, uncomplicated: Secondary | ICD-10-CM | POA: Insufficient documentation

## 2019-05-24 DIAGNOSIS — F17210 Nicotine dependence, cigarettes, uncomplicated: Secondary | ICD-10-CM | POA: Insufficient documentation

## 2019-05-24 DIAGNOSIS — F10920 Alcohol use, unspecified with intoxication, uncomplicated: Secondary | ICD-10-CM

## 2019-05-24 DIAGNOSIS — R112 Nausea with vomiting, unspecified: Secondary | ICD-10-CM | POA: Insufficient documentation

## 2019-05-24 LAB — CBC WITH DIFFERENTIAL/PLATELET
Abs Immature Granulocytes: 0.01 10*3/uL (ref 0.00–0.07)
Basophils Absolute: 0.1 10*3/uL (ref 0.0–0.1)
Basophils Relative: 1 %
Eosinophils Absolute: 0 10*3/uL (ref 0.0–0.5)
Eosinophils Relative: 1 %
HCT: 44 % (ref 39.0–52.0)
Hemoglobin: 14.5 g/dL (ref 13.0–17.0)
Immature Granulocytes: 0 %
Lymphocytes Relative: 71 %
Lymphs Abs: 3.7 10*3/uL (ref 0.7–4.0)
MCH: 27.9 pg (ref 26.0–34.0)
MCHC: 33 g/dL (ref 30.0–36.0)
MCV: 84.6 fL (ref 80.0–100.0)
Monocytes Absolute: 0.5 10*3/uL (ref 0.1–1.0)
Monocytes Relative: 9 %
Neutro Abs: 1 10*3/uL — ABNORMAL LOW (ref 1.7–7.7)
Neutrophils Relative %: 18 %
Platelets: 100 10*3/uL — ABNORMAL LOW (ref 150–400)
RBC: 5.2 MIL/uL (ref 4.22–5.81)
RDW: 16.1 % — ABNORMAL HIGH (ref 11.5–15.5)
WBC: 5.2 10*3/uL (ref 4.0–10.5)
nRBC: 0 % (ref 0.0–0.2)

## 2019-05-24 LAB — RAPID URINE DRUG SCREEN, HOSP PERFORMED
Amphetamines: NOT DETECTED
Barbiturates: NOT DETECTED
Benzodiazepines: NOT DETECTED
Cocaine: NOT DETECTED
Opiates: NOT DETECTED
Tetrahydrocannabinol: POSITIVE — AB

## 2019-05-24 LAB — COMPREHENSIVE METABOLIC PANEL
ALT: 52 U/L — ABNORMAL HIGH (ref 0–44)
AST: 61 U/L — ABNORMAL HIGH (ref 15–41)
Albumin: 4.6 g/dL (ref 3.5–5.0)
Alkaline Phosphatase: 63 U/L (ref 38–126)
Anion gap: 13 (ref 5–15)
BUN: 5 mg/dL — ABNORMAL LOW (ref 6–20)
CO2: 26 mmol/L (ref 22–32)
Calcium: 8.3 mg/dL — ABNORMAL LOW (ref 8.9–10.3)
Chloride: 101 mmol/L (ref 98–111)
Creatinine, Ser: 0.62 mg/dL (ref 0.61–1.24)
GFR calc Af Amer: >60 mL/min (ref >60)
GFR calc non Af Amer: >60 mL/min (ref >60)
Glucose, Bld: 101 mg/dL — ABNORMAL HIGH (ref 70–99)
Potassium: 4.1 mmol/L (ref 3.5–5.1)
Sodium: 140 mmol/L (ref 135–145)
Total Bilirubin: 0.5 mg/dL (ref 0.3–1.2)
Total Protein: 8.9 g/dL — ABNORMAL HIGH (ref 6.5–8.1)

## 2019-05-24 LAB — LIPASE, BLOOD: Lipase: 29 U/L (ref 11–51)

## 2019-05-24 LAB — ETHANOL: Alcohol, Ethyl (B): 395 mg/dL (ref <10)

## 2019-05-24 MED ORDER — LORAZEPAM 1 MG PO TABS: 0.0000 mg | ORAL_TABLET | Freq: Two times a day (BID) | ORAL | Status: DC

## 2019-05-24 MED ORDER — LORAZEPAM 2 MG/ML IJ SOLN: 0.0000 mg | Freq: Two times a day (BID) | INTRAMUSCULAR | Status: DC

## 2019-05-24 MED ORDER — SODIUM CHLORIDE 0.9 % IV BOLUS
1000.0000 mL | Freq: Once | INTRAVENOUS | Status: AC
  Administered 2019-05-24: 22:00:00 1000 mL via INTRAVENOUS

## 2019-05-24 MED ORDER — LORAZEPAM 2 MG/ML IJ SOLN: 0.0000 mg | Freq: Four times a day (QID) | INTRAMUSCULAR | Status: DC

## 2019-05-24 MED ORDER — THIAMINE HCL 100 MG/ML IJ SOLN: 100.0000 mg | Freq: Every day | INTRAMUSCULAR | Status: DC

## 2019-05-24 MED ORDER — THIAMINE HCL 100 MG PO TABS: 100.0000 mg | ORAL_TABLET | Freq: Every day | ORAL | Status: DC

## 2019-05-24 MED ORDER — PANTOPRAZOLE SODIUM 40 MG IV SOLR
40.0000 mg | Freq: Once | INTRAVENOUS | Status: AC
  Administered 2019-05-24: 22:00:00 40 mg via INTRAVENOUS
  Filled 2019-05-24: qty 40

## 2019-05-24 MED ORDER — LORAZEPAM 1 MG PO TABS: 0.0000 mg | ORAL_TABLET | Freq: Four times a day (QID) | ORAL | Status: DC

## 2019-05-24 NOTE — ED Provider Notes (Signed)
[facility]-EMERGENCY DEPT Provider Note   CSN: [id number] Arrival date & time: 05/24/19  2116     History Abdominal pain   Jake Nguyen is a 54 y.o. male with past history significant for alcoholism presents for evaluation abdominal pain and emesis.  States he just got done with an alcohol "binger over the last 3 days." NBNB emesis at home. Denies CP, SOB, headache, dysuria, diarrhea however patient seems very intoxicated on exam and is intermittently falling asleep.  Last drink PTA. States drank a "40"  Level 5 Caveat- Intoxication  Patient received Zofran via EMS. Intermittently aggitated however able to be redirected  No prior hx of esophageal varices per patient  HPI     Past Medical History:  Diagnosis Date  . Alcoholism [facility])     Patient Active Problem List   Diagnosis Date Noted  . Pressure injury of skin 05/25/2018  . Rhabdomyolysis 05/20/2018  . Hyponatremia 05/20/2018  . Elevated troponin 05/20/2018  . Moderate dehydration 05/20/2018  . Alcohol use 05/20/2018  . Influenza A 05/20/2018  . Transaminitis 05/20/2018    No past surgical history on file.     No family history on file.  Social History   Tobacco Use  . Smoking status: Current Every Day Smoker    Packs/day: 0.50    Types: Cigarettes  . Smokeless tobacco: Never Used  Substance Use Topics  . Alcohol use: Yes  . Drug use: Not on file    Home Medications Prior to Admission medications   Medication Sig Start Date End Date Taking? Authorizing Provider  ibuprofen (ADVIL,MOTRIN) 400 MG tablet Take 1 tablet (400 mg total) by mouth 3 (three) times daily as needed for fever. Patient not taking: Reported on 05/24/2019 05/28/18   Jerald Kief, MD  metoprolol tartrate (LOPRESSOR) 25 MG tablet Take 1 tablet (25 mg total) by mouth 2 (two) times daily for 30 days. Patient not taking: Reported on 05/24/2019 05/28/18 06/27/18  Jerald Kief, MD  predniSONE (DELTASONE) 10 MG  tablet Taper dose: 40mg  po x 2 days, then 20mg  po daily x 2 days, then 10mg  po daily x 2 days, then 5mg  po daily x 2 days, then stop. Zero refills Patient not taking: Reported on 05/24/2019 05/28/18   , MD    Allergies    Pork-derived products  Review of Systems   Review of Systems  Unable to perform ROS: Other (Intoxicated)  Constitutional: Negative for fatigue and fever.  Respiratory: Negative for cough and shortness of breath.   Cardiovascular: Negative for chest pain.  Gastrointestinal: Positive for abdominal pain, nausea and vomiting.  All other systems reviewed and are negative.   Physical Exam Updated Vital Signs BP 105/68   Pulse 60   Temp 98.2 F (36.8 C) (Oral)   Resp (!) 22   Ht 5\' 11"  (1.803 m)   Wt 72 kg   SpO2 93%   BMI 22.14 kg/m   Physical Exam Constitutional:      General: He is not in acute distress.    Appearance: He is not toxic-appearing or diaphoretic.     Comments: Disheveled, smells of alcohol. Falls asleep mid sentence  HENT:     Head: Normocephalic and atraumatic.     Mouth/Throat:     Mouth: Mucous membranes are moist.     Comments: Mucous membranes moist. Poor dentition with multiple missing teeth and dental carries Cardiovascular:     Rate and Rhythm: Normal rate.  Pulses:          Dorsalis pedis pulses are 2+ on the right side and 2+ on the left side.       Posterior tibial pulses are 2+ on the right side and 2+ on the left side.     Heart sounds: Normal heart sounds.  Pulmonary:     Effort: Pulmonary effort is normal.     Breath sounds: Normal breath sounds.  Abdominal:     General: Bowel sounds are normal.     Palpations: Abdomen is soft.     Tenderness: There is abdominal tenderness in the epigastric area. There is no right CVA tenderness, left CVA tenderness, guarding or rebound. Negative signs include Murphy's sign.     Comments: Mild tenderness to palpation to epigastric region. Negative murphy sign    Musculoskeletal:     Comments: Compartments soft. Moves all 4 extremities without difficulty.  Skin:    Comments: Brisk cap refill    ED Results / Procedures / Treatments   Labs (all labs ordered are listed, but only abnormal results are displayed) Labs Reviewed  CBC WITH DIFFERENTIAL/PLATELET - Abnormal; Notable for the following components:      Result Value   RDW 16.1 (*)    Platelets 100 (*)    Neutro Abs 1.0 (*)    All other components within normal limits  COMPREHENSIVE METABOLIC PANEL - Abnormal; Notable for the following components:   Glucose, Bld 101 (*)    BUN 5 (*)    Calcium 8.3 (*)    Total Protein 8.9 (*)    AST 61 (*)    ALT 52 (*)    All other components within normal limits  ETHANOL - Abnormal; Notable for the following components:   Alcohol, Ethyl (B) 395 (*)    All other components within normal limits  RAPID URINE DRUG SCREEN, HOSP PERFORMED - Abnormal; Notable for the following components:   Tetrahydrocannabinol POSITIVE (*)    All other components within normal limits  LIPASE, BLOOD  TROPONIN I (HIGH SENSITIVITY)    EKG EKG Interpretation  Date/Time:  Friday May 24 2019 21:32:05 EST Ventricular Rate:  62 PR Interval:    QRS Duration: 93 QT Interval:  420 QTC Calculation: 427 R Axis:   90 Text Interpretation: Sinus rhythm Borderline right axis deviation Borderline low voltage, extremity leads ST elev, probable normal early repol pattern inferior ST elevation improved from previous No significant change was found Confirmed by Ezequiel Essex [phone]) on 05/24/2019 11:00:03 PM   Radiology No results found.  Procedures Procedures (including critical care time)  Medications Ordered in ED Medications  LORazepam (ATIVAN) injection 0-4 mg (has no administration in time range)    Or  LORazepam (ATIVAN) tablet 0-4 mg (has no administration in time range)  LORazepam (ATIVAN) injection 0-4 mg (has no administration in time range)    Or   LORazepam (ATIVAN) tablet 0-4 mg (has no administration in time range)  thiamine tablet 100 mg (has no administration in time range)    Or  thiamine (B-1) injection 100 mg (has no administration in time range)  sodium chloride 0.9 % bolus 1,000 mL (1,000 mLs Intravenous New Bag/Given 05/24/19 2210)  pantoprazole (PROTONIX) injection 40 mg (40 mg Intravenous Given 05/24/19 2207)   ED Course  I have reviewed the triage vital signs and the nursing notes.  Pertinent labs & imaging results that were available during my care of the patient were reviewed by me and considered in  my medical decision making (see chart for details).  54 year old male appears otherwise well presents for evaluation of abdominal pain.  Known history of alcohol abuse.  Afebrile, nonseptic appearing.  Patient states had a 3-day "binger" of alcohol. Patient disheveled, smells of alcohol. Slurred speech obviously intoxicated on exam.  Has some mild epigastric pain however no rebound, guarding.  Negative Murphy sign.  No chest pain, shortness of breath.  Patient was agitated intermittently with EMS however able to be redirected.  Plan on labs, fluid and reevaluate.  Labs and imaging personally reviewed and interpreted: UDS positive for THC EKG with mild ST elevation however improved from previous. Denies CP, SOB, hemoptysis. Will add trop. Previously elevated trops in the past. Lipase 29 CMP with mild elevation in LFT however with significant improvement in previous labs. CBC without leukocytosis Ethanol 395 Trop pending  2320: Patient reevaluated. No emesis in ED. States only has abd pain when he moves. No radiation into back, No CP, SOB. Abd soft, non tender without rebound or guarding.Texting on phone on repeat evaluation. No acute distress noted.  Care transferred to [facility] [place] who will follow up on remaining labs. She will determine ultimate disposition. If labs negative and able to tolerate PO intake plan to obs until  clinically sober and dc home. Suspect alcoholic gastritis.    MDM Rules/Calculators/A&P                       Final Clinical Impression(s) / ED Diagnoses Final diagnoses:  Alcoholic intoxication without complication (HCC)  Epigastric abdominal pain  Non-intractable vomiting with nausea, unspecified vomiting type    Rx / DC Orders ED Discharge Orders    None       Baani Bober A, PA-C 05/24/19 2335    Glynn Octave, MD 05/25/19 1353

## 2019-05-24 NOTE — ED Triage Notes (Signed)
Pt presents to the ED from home via [facility] c/o N/V and abdominal pain x3 days with a hx of heavy ETOH use. EMS gave 4mg  Zofran IV. No episodes of emesis with EMS. Pt was belligerent at times per EMS but able to be verbally deescalated.

## 2019-05-24 NOTE — ED Notes (Signed)
Labeled specimen cup and culture sent to lab. [employer]

## 2019-05-24 NOTE — ED Notes (Signed)
Critical Alcohol Level is 395. EDP and Primary RN made aware.

## 2019-05-25 LAB — TROPONIN I (HIGH SENSITIVITY)
Troponin I (High Sensitivity): 5 ng/L (ref <18)
Troponin I (High Sensitivity): 5 ng/L (ref <18)

## 2019-05-25 MED ORDER — THIAMINE HCL 100 MG/ML IJ SOLN
Freq: Once | INTRAVENOUS | Status: AC
  Filled 2019-05-25: qty 1000

## 2019-05-25 NOTE — ED Provider Notes (Signed)
5:28 AM Patient care assumed at shift change from tenderly, PA-C.  In short, patient is a 54 year old male with a history of alcoholism complaining of epigastric abdominal pain and vomiting after an alcohol binge x3 days.  Was treated with IV fluids as well as Protonix.  Placed on CIWA protocol given acute intoxication in the ED.  Troponin was pending at shift change.  This is stable, negative x2.  The patient repeatedly denied chest pain.  Do not feel further cardiopulmonary evaluation is indicated.  Patient has been allowed to sober in the emergency department for the past 8 hours.  VSS.  Will attempt ambulation.  6:22 AM Patient too intoxicated for ambulation. Continues to sleep.   6:30 AM Patient assessed.  Easily awoken.  Is able to tell me that he is in [place] in the hospital.  He is asking that he be removed from all items on the monitor.  Discussed ambulation attempt with patient who verbalizes understanding.  Anticipate discharge if able to ambulate with steady gait.   Vitals:   05/25/19 0350 05/25/19 0400 05/25/19 0430 05/25/19 0500  BP:  91/70 91/64 103/78  Pulse: 80 63 72 88  Resp: (!) 21 17 16 19   Temp:      TempSrc:      SpO2: 100% 96% 97% 99%  Weight:      Height:          , PA-C 05/25/19 0631    Palumbo, April, MD 05/25/19 05/27/19

## 2019-07-16 ENCOUNTER — Ambulatory Visit: Payer: Self-pay | Attending: Internal Medicine | Admitting: Internal Medicine

## 2019-07-16 ENCOUNTER — Other Ambulatory Visit: Payer: Self-pay

## 2019-08-06 ENCOUNTER — Ambulatory Visit: Payer: Self-pay | Attending: Internal Medicine | Admitting: Internal Medicine

## 2019-08-06 ENCOUNTER — Other Ambulatory Visit: Payer: Self-pay

## 2019-11-04 ENCOUNTER — Other Ambulatory Visit: Payer: Self-pay

## 2019-11-04 ENCOUNTER — Encounter (HOSPITAL_COMMUNITY): Payer: Self-pay

## 2019-11-04 ENCOUNTER — Emergency Department (HOSPITAL_COMMUNITY)
Admission: EM | Admit: 2019-11-04 | Discharge: 2019-11-04 | Discharge status: Against Medical Advice | Payer: Self-pay | Attending: Emergency Medicine | Admitting: Emergency Medicine

## 2019-11-04 DIAGNOSIS — R000 Tachycardia, unspecified: Secondary | ICD-10-CM | POA: Insufficient documentation

## 2019-11-04 DIAGNOSIS — F17210 Nicotine dependence, cigarettes, uncomplicated: Secondary | ICD-10-CM | POA: Insufficient documentation

## 2019-11-04 DIAGNOSIS — R61 Generalized hyperhidrosis: Secondary | ICD-10-CM | POA: Insufficient documentation

## 2019-11-04 DIAGNOSIS — Z79899 Other long term (current) drug therapy: Secondary | ICD-10-CM | POA: Insufficient documentation

## 2019-11-04 DIAGNOSIS — R569 Unspecified convulsions: Secondary | ICD-10-CM | POA: Insufficient documentation

## 2019-11-04 NOTE — ED Notes (Addendum)
Pt clearly agitated, visible tremors, profusely diaphoretic, requesting to leave at this time. Patient's visitor notified this RN that pt is a severe alcoholic and has not had anything to drink today. Provider made aware. Pt informed that leaving now is against medical advice and dangerous to his health. Pt insisted on leaving and signed AMA form.

## 2019-11-04 NOTE — ED Triage Notes (Signed)
Pt BIB [facility] from home following seizure witnessed by wife, no hx of the same. Pt post ictal on EMS arrival, diaphoretic, mouth trauma noted, no obstruction of airway. ETOH use per wife, none today. CBG 105, sinus tach in 130's, VSS, A&Ox4 at this time, no memory of event.

## 2019-11-04 NOTE — ED Provider Notes (Signed)
[facility] EMERGENCY DEPARTMENT Provider Note   CSN: [id number] Arrival date & time: 11/04/19  1108    History Chief Complaint  Patient presents with  . Seizures    Jake Nguyen is a 54 y.o. male with past medical history significant for rhabdomyolysis, hyponatremia, alcohol abuse who presents for evaluation of witnessed seizure.  No prior history of same.  Postictal on EMS arrival with diaphoretic and dry mouth.  Patient with chronic alcohol use however wife states patient has not used in approximately 18 hours.  Prior history of DTs or withdrawal seizures.  Patient visibly agitated in room with tremors and diaphoretic.  Patient states "I am leaving this should."  He refuses to answer any of my questions.  Patient got up from bed and states he is leaving.  Patient denies any pain.  States "I just need to drink."  History obtained from patient and past medical records.  No interpreter used.   HPI    Past Medical History:  Diagnosis Date  . Alcoholism [facility])     Patient Active Problem List   Diagnosis Date Noted  . Pressure injury of skin 05/25/2018  . Rhabdomyolysis 05/20/2018  . Hyponatremia 05/20/2018  . Elevated troponin 05/20/2018  . Moderate dehydration 05/20/2018  . Alcohol use 05/20/2018  . Influenza A 05/20/2018  . Transaminitis 05/20/2018    History reviewed. No pertinent surgical history.     History reviewed. No pertinent family history.  Social History   Tobacco Use  . Smoking status: Current Every Day Smoker    Packs/day: 0.50    Types: Cigarettes  . Smokeless tobacco: Never Used  Substance Use Topics  . Alcohol use: Yes  . Drug use: Not on file    Home Medications Prior to Admission medications   Medication Sig Start Date End Date Taking? Authorizing Provider  ibuprofen (ADVIL,MOTRIN) 400 MG tablet Take 1 tablet (400 mg total) by mouth 3 (three) times daily as needed for fever. Patient not taking: Reported on  05/24/2019 05/28/18   Jerald Kief, MD  metoprolol tartrate (LOPRESSOR) 25 MG tablet Take 1 tablet (25 mg total) by mouth 2 (two) times daily for 30 days. Patient not taking: Reported on 05/24/2019 05/28/18 06/27/18  Jerald Kief, MD  predniSONE (DELTASONE) 10 MG tablet Taper dose: 40mg  po x 2 days, then 20mg  po daily x 2 days, then 10mg  po daily x 2 days, then 5mg  po daily x 2 days, then stop. Zero refills Patient not taking: Reported on 05/24/2019 05/28/18   , MD    Allergies    Pork-derived products  Review of Systems   Review of Systems  Constitutional: Negative.   HENT: Negative.   Respiratory: Negative.   Cardiovascular: Negative.   Gastrointestinal: Negative.   Genitourinary: Negative.   Musculoskeletal: Negative.   Skin: Negative.   Neurological: Negative.   All other systems reviewed and are negative.   Physical Exam Updated Vital Signs BP (!) 132/98   Pulse (!) 113   Temp 97.9 F (36.6 C) (Oral)   Resp 20   Ht 6' (1.829 m)   Wt 72.6 kg   SpO2 95%   BMI 21.70 kg/m   Physical Exam Vitals and nursing note reviewed.  Constitutional:      General: He is not in acute distress.    Appearance: He is well-developed. He is diaphoretic. He is not toxic-appearing.  HENT:     Head: Normocephalic and atraumatic.  Comments: Circular nodule to right mandible.    Nose: Nose normal.     Mouth/Throat:     Mouth: Mucous membranes are dry.  Eyes:     Pupils: Pupils are equal, round, and reactive to light.  Cardiovascular:     Rate and Rhythm: Regular rhythm. Tachycardia present.     Pulses: Normal pulses.     Heart sounds: Normal heart sounds.  Pulmonary:     Effort: Pulmonary effort is normal. No respiratory distress.     Breath sounds: Normal breath sounds.     Comments: Speaks in full sentences without difficulty Abdominal:     General: Bowel sounds are normal. There is no distension.     Palpations: Abdomen is soft.     Comments: Soft, nontender   Musculoskeletal:        General: Normal range of motion.     Cervical back: Normal range of motion and neck supple.     Comments: Bilateral tremors however no bony tenderness.  Moves all 4 extremities without difficulty.  Skin:    General: Skin is warm.     Comments: Diaphoretic. Unable to perform remaining skin exam due to patient refusal  Neurological:     General: No focal deficit present.     Mental Status: He is alert and oriented to person, place, and time.     Comments: Patient alert to person, place, time, and president.  He has bilateral tremors.  He is ambulatory without difficulty.   Psychiatric:     Comments: Denies SI, HI, AVH.    ED Results / Procedures / Treatments   Labs (all labs ordered are listed, but only abnormal results are displayed) Labs Reviewed - No data to display  EKG None  Radiology No results found.  Procedures Procedures (including critical care time)  Medications Ordered in ED Medications - No data to display  ED Course  I have reviewed the triage vital signs and the nursing notes.  Pertinent labs & imaging results that were available during my care of the patient were reviewed by me and considered in my medical decision making (see chart for details).  54 year old presents for evaluation of seizure.  Known history of alcohol abuse.  Per wife has not had alcohol in approximately 18 hours.  No prior history of DTs or withdrawal seizures.  Postictal on EMS arrival.  Patient tachycardic, mildly hypertensive, diaphoretic, tremulous.  Patient agitated, intermittently refusing to answer questions.  Patient stating "I am getting out of this shit."  States "I just need to drink." "Leave me the F alone." Doe snot appear to be clinically intoxicated or under illicit substance  Discussed this with family.  Family members also try to convince patient to stay however patient is adamant that he is leaving AGAINST MEDICAL ADVICE.   Patient A x O x4.  He  ambulated around room in the emergency department doors without difficulty with family member.  Patient appears to have capacity to make decisions.  I had thorough discussion with patient as well as nursing, Copy and tech in room.  Patient states he is leaving.  I thoroughly discussed risk of leaving including death, permanent organ or limb damage.  Patient voiced understanding of risk versus benefit and has chosen to AGAINST MEDICAL ADVICE.  I did offer Ativan here in the ED prior to leaving as well as Librium taper for at home however patient adamantly refuses this.   We discussed the nature and purpose, risks and benefits,  as well as, the alternatives of treatment. Time was given to allow the opportunity to ask questions and consider their options, and after the discussion, the patient decided to refuse the offerred treatment. The patient was informed that refusal could lead to, but was not limited to, death, permanent disability, or severe pain. If present, I asked the relatives or significant others to dissuade them without success. Prior to refusing, I determined that the patient had the capacity to make their decision and understood the consequences of that decision. After refusal, I made every reasonable opportunity to treat them to the best of my ability.  The patient was notified that they may return to the emergency department at any time for further treatment.    Discussed patient with attending, Dr. Effie Shy who states if patient has capacity make medical decisions he will have to leave AGAINST MEDICAL ADVICE if he does not want treatment.     MDM Rules/Calculators/A&P                           Final Clinical Impression(s) / ED Diagnoses Final diagnoses:  Seizure [facility])    Rx / DC Orders ED Discharge Orders    None       Sunaina Ferrando A, PA-C 11/04/19 1214    Mancel Bale, MD 11/04/19 1746

## 2019-11-06 ENCOUNTER — Inpatient Hospital Stay (HOSPITAL_COMMUNITY)
Admission: EM | Admit: 2019-11-06 | Discharge: 2019-11-12 | DRG: 897 | Disposition: A | Discharge status: Home / Self Care | Payer: Self-pay | Attending: Family Medicine | Admitting: Family Medicine

## 2019-11-06 DIAGNOSIS — F1014 Alcohol abuse with alcohol-induced mood disorder: Secondary | ICD-10-CM

## 2019-11-06 DIAGNOSIS — Z20822 Contact with and (suspected) exposure to covid-19: Secondary | ICD-10-CM | POA: Diagnosis present

## 2019-11-06 DIAGNOSIS — E860 Dehydration: Secondary | ICD-10-CM | POA: Diagnosis present

## 2019-11-06 DIAGNOSIS — X58XXXA Exposure to other specified factors, initial encounter: Secondary | ICD-10-CM | POA: Diagnosis present

## 2019-11-06 DIAGNOSIS — F1210 Cannabis abuse, uncomplicated: Secondary | ICD-10-CM | POA: Diagnosis present

## 2019-11-06 DIAGNOSIS — R270 Ataxia, unspecified: Secondary | ICD-10-CM

## 2019-11-06 DIAGNOSIS — E871 Hypo-osmolality and hyponatremia: Secondary | ICD-10-CM | POA: Diagnosis present

## 2019-11-06 DIAGNOSIS — F1020 Alcohol dependence, uncomplicated: Secondary | ICD-10-CM

## 2019-11-06 DIAGNOSIS — F10931 Alcohol use, unspecified with withdrawal delirium: Secondary | ICD-10-CM

## 2019-11-06 DIAGNOSIS — F17210 Nicotine dependence, cigarettes, uncomplicated: Secondary | ICD-10-CM | POA: Diagnosis present

## 2019-11-06 DIAGNOSIS — E875 Hyperkalemia: Secondary | ICD-10-CM | POA: Diagnosis present

## 2019-11-06 DIAGNOSIS — R569 Unspecified convulsions: Secondary | ICD-10-CM | POA: Diagnosis present

## 2019-11-06 DIAGNOSIS — R2681 Unsteadiness on feet: Secondary | ICD-10-CM | POA: Diagnosis present

## 2019-11-06 DIAGNOSIS — N179 Acute kidney failure, unspecified: Secondary | ICD-10-CM | POA: Diagnosis present

## 2019-11-06 DIAGNOSIS — Z7289 Other problems related to lifestyle: Secondary | ICD-10-CM

## 2019-11-06 DIAGNOSIS — D696 Thrombocytopenia, unspecified: Secondary | ICD-10-CM | POA: Diagnosis present

## 2019-11-06 DIAGNOSIS — IMO0001 Reserved for inherently not codable concepts without codable children: Secondary | ICD-10-CM

## 2019-11-06 DIAGNOSIS — Y901 Blood alcohol level of 20-39 mg/100 ml: Secondary | ICD-10-CM | POA: Diagnosis present

## 2019-11-06 DIAGNOSIS — F10231 Alcohol dependence with withdrawal delirium: Principal | ICD-10-CM | POA: Diagnosis present

## 2019-11-06 DIAGNOSIS — R000 Tachycardia, unspecified: Secondary | ICD-10-CM | POA: Diagnosis not present

## 2019-11-06 DIAGNOSIS — E872 Acidosis: Secondary | ICD-10-CM | POA: Diagnosis present

## 2019-11-06 DIAGNOSIS — S40022A Contusion of left upper arm, initial encounter: Secondary | ICD-10-CM | POA: Diagnosis present

## 2019-11-06 DIAGNOSIS — L850 Acquired ichthyosis: Secondary | ICD-10-CM | POA: Diagnosis present

## 2019-11-06 DIAGNOSIS — E876 Hypokalemia: Secondary | ICD-10-CM | POA: Diagnosis not present

## 2019-11-06 DIAGNOSIS — F1024 Alcohol dependence with alcohol-induced mood disorder: Secondary | ICD-10-CM | POA: Diagnosis present

## 2019-11-06 DIAGNOSIS — S0093XA Contusion of unspecified part of head, initial encounter: Secondary | ICD-10-CM | POA: Diagnosis present

## 2019-11-06 HISTORY — DX: Alcohol use, unspecified with withdrawal delirium: F10.931

## 2019-11-06 LAB — CBC
HCT: 40 % (ref 39.0–52.0)
Hemoglobin: 13.9 g/dL (ref 13.0–17.0)
MCH: 29.8 pg (ref 26.0–34.0)
MCHC: 34.8 g/dL (ref 30.0–36.0)
MCV: 85.7 fL (ref 80.0–100.0)
Platelets: 69 10*3/uL — ABNORMAL LOW (ref 150–400)
RBC: 4.67 MIL/uL (ref 4.22–5.81)
RDW: 14.3 % (ref 11.5–15.5)
WBC: 8 10*3/uL (ref 4.0–10.5)
nRBC: 0 % (ref 0.0–0.2)

## 2019-11-06 LAB — HIV ANTIBODY (ROUTINE TESTING W REFLEX): HIV Screen 4th Generation wRfx: NONREACTIVE

## 2019-11-06 LAB — COMPREHENSIVE METABOLIC PANEL
ALT: 43 U/L (ref 0–44)
AST: 181 U/L — ABNORMAL HIGH (ref 15–41)
Albumin: 4.7 g/dL (ref 3.5–5.0)
Alkaline Phosphatase: 63 U/L (ref 38–126)
Anion gap: 18 — ABNORMAL HIGH (ref 5–15)
BUN: 9 mg/dL (ref 6–20)
CO2: 19 mmol/L — ABNORMAL LOW (ref 22–32)
Calcium: 9.4 mg/dL (ref 8.9–10.3)
Chloride: 90 mmol/L — ABNORMAL LOW (ref 98–111)
Creatinine, Ser: 1.32 mg/dL — ABNORMAL HIGH (ref 0.61–1.24)
GFR calc Af Amer: >60 mL/min (ref >60)
GFR calc non Af Amer: >60 mL/min (ref >60)
Glucose, Bld: 101 mg/dL — ABNORMAL HIGH (ref 70–99)
Potassium: 5.2 mmol/L — ABNORMAL HIGH (ref 3.5–5.1)
Sodium: 127 mmol/L — ABNORMAL LOW (ref 135–145)
Total Bilirubin: 1.1 mg/dL (ref 0.3–1.2)
Total Protein: 10.3 g/dL — ABNORMAL HIGH (ref 6.5–8.1)

## 2019-11-06 LAB — SARS CORONAVIRUS 2 BY RT PCR (DIASORIN): SARS Coronavirus 2: NEGATIVE

## 2019-11-06 LAB — BETA-HYDROXYBUTYRIC ACID: Beta-Hydroxybutyric Acid: 0.77 mmol/L — ABNORMAL HIGH (ref 0.05–0.27)

## 2019-11-06 LAB — GLUCOSE, CAPILLARY: Glucose-Capillary: 90 mg/dL (ref 70–99)

## 2019-11-06 LAB — ETHANOL: Alcohol, Ethyl (B): 37 mg/dL — ABNORMAL HIGH (ref <10)

## 2019-11-06 MED ORDER — LORAZEPAM 1 MG PO TABS
0.0000 mg | ORAL_TABLET | Freq: Four times a day (QID) | ORAL | Status: DC
  Administered 2019-11-06: 2 mg via ORAL
  Filled 2019-11-06: qty 2

## 2019-11-06 MED ORDER — LORAZEPAM 2 MG/ML IJ SOLN
2.0000 mg | Freq: Once | INTRAMUSCULAR | Status: AC
  Administered 2019-11-06: 2 mg via INTRAVENOUS

## 2019-11-06 MED ORDER — LORAZEPAM 2 MG/ML IJ SOLN
1.0000 mg | Freq: Once | INTRAMUSCULAR | Status: AC
  Administered 2019-11-06: 1 mg via INTRAVENOUS
  Filled 2019-11-06: qty 1

## 2019-11-06 MED ORDER — THIAMINE HCL 100 MG/ML IJ SOLN
100.0000 mg | Freq: Every day | INTRAMUSCULAR | Status: DC
  Administered 2019-11-06 – 2019-11-08 (×2): 100 mg via INTRAVENOUS
  Filled 2019-11-06 (×2): qty 2

## 2019-11-06 MED ORDER — PHENOBARBITAL SODIUM 130 MG/ML IJ SOLN
130.0000 mg | Freq: Once | INTRAMUSCULAR | Status: AC
  Administered 2019-11-06: 130 mg via INTRAVENOUS
  Filled 2019-11-06 (×2): qty 1

## 2019-11-06 MED ORDER — LORAZEPAM 2 MG/ML IJ SOLN
0.0000 mg | INTRAMUSCULAR | Status: DC | PRN
  Administered 2019-11-08 – 2019-11-09 (×4): 2 mg via INTRAVENOUS
  Administered 2019-11-09 (×2): 4 mg via INTRAVENOUS
  Filled 2019-11-06: qty 2
  Filled 2019-11-06: qty 1
  Filled 2019-11-06: qty 2
  Filled 2019-11-06: qty 1
  Filled 2019-11-06: qty 2
  Filled 2019-11-06: qty 1
  Filled 2019-11-06: qty 2

## 2019-11-06 MED ORDER — SODIUM CHLORIDE 0.9 % IV BOLUS
1000.0000 mL | Freq: Once | INTRAVENOUS | Status: AC
  Administered 2019-11-06: 1000 mL via INTRAVENOUS

## 2019-11-06 MED ORDER — LORAZEPAM 2 MG/ML IJ SOLN
0.0000 mg | Freq: Four times a day (QID) | INTRAMUSCULAR | Status: DC
  Administered 2019-11-06: 2 mg via INTRAVENOUS
  Administered 2019-11-06: 1 mg via INTRAVENOUS
  Filled 2019-11-06 (×3): qty 1

## 2019-11-06 MED ORDER — FOLIC ACID 5 MG/ML IJ SOLN
1.0000 mg | Freq: Every day | INTRAMUSCULAR | Status: DC
  Administered 2019-11-07 – 2019-11-08 (×2): 1 mg via INTRAVENOUS
  Filled 2019-11-06 (×3): qty 0.2

## 2019-11-06 MED ORDER — LORAZEPAM 1 MG PO TABS
0.0000 mg | ORAL_TABLET | ORAL | Status: DC | PRN
  Filled 2019-11-06: qty 4

## 2019-11-06 MED ORDER — THIAMINE HCL 100 MG PO TABS
100.0000 mg | ORAL_TABLET | Freq: Every day | ORAL | Status: DC
  Administered 2019-11-07 – 2019-11-12 (×4): 100 mg via ORAL
  Filled 2019-11-06 (×5): qty 1

## 2019-11-06 MED ORDER — LORAZEPAM 2 MG/ML IJ SOLN
2.0000 mg | INTRAMUSCULAR | Status: DC
  Administered 2019-11-06 – 2019-11-08 (×11): 2 mg via INTRAVENOUS
  Filled 2019-11-06 (×10): qty 1

## 2019-11-06 MED ORDER — ADULT MULTIVITAMIN W/MINERALS CH
1.0000 | ORAL_TABLET | Freq: Every day | ORAL | Status: DC
  Administered 2019-11-07 – 2019-11-12 (×4): 1 via ORAL
  Filled 2019-11-06 (×5): qty 1

## 2019-11-06 MED ORDER — LORAZEPAM 1 MG PO TABS: 0.0000 mg | ORAL_TABLET | Freq: Two times a day (BID) | ORAL | Status: DC

## 2019-11-06 MED ORDER — LORAZEPAM 2 MG/ML IJ SOLN
0.0000 mg | Freq: Two times a day (BID) | INTRAMUSCULAR | Status: DC
  Administered 2019-11-06: 1 mg via INTRAVENOUS

## 2019-11-06 NOTE — ED Notes (Signed)
MAGISTRATE CALLED AROUND 1300 AND IVC PAPERWORK MISSING PETITION. RN TALKED TO DR. Yvone Neu AGREES THIS IS MEDICAL PROBLEM NOT PSYC. WILL NOT PURSUE IVC PROCESS AT THIS TIME

## 2019-11-06 NOTE — ED Triage Notes (Addendum)
Pt has been IVCed by wife for leaving hospital against medical advice. She reports acting erratic and paranoid. Drinking beer only and not eating. Pt states he does not want to be here and knows what he needs to do to go through motions to get discharged.

## 2019-11-06 NOTE — Progress Notes (Addendum)
Late entry. Checked in on Jake Nguyen around 10:30pm.  He was sleeping calmly, occasionally taking his legs.  Nursing and staff have no concerns at this time.  I did not attempt to wake him, instead letting him rest.  I was able to perform limited physical exam as below.  Vitals:   11/06/19 1954 11/06/19 2048  BP: 124/81   Pulse: 91 97  Resp: (!) 25   Temp: 99.6 F (37.6 C)   SpO2: 96%    General: Sleeping comfortably Cardiac: Regular rate and rhythm, no murmurs auscultated Respiratory: Clear to auscultation bilaterally Extremities: No BLE edema, palpable pedal pulses  Fayette Pho, MD

## 2019-11-06 NOTE — Progress Notes (Signed)
Patient heart rate going up into the 180s-190s patient is not sympathic. Heart rate is not sustaining at that rate. MD notified new orders for EKG given.  Rod Majerus, Kae Heller, RN

## 2019-11-06 NOTE — Consult Note (Signed)
NAME:  Jake Nguyen, MRN:  [id number], DOB:  1965-07-20, LOS: 0 ADMISSION DATE:  11/06/2019, CONSULTATION DATE:  11/06/2019 REFERRING MD: Pearlean Brownie, MD CHIEF COMPLAINT:  Alcohol withdrawl  Brief History   Consulted for alcohol withdrawal hallucinations History of present illness   Patient currently having hallucinations and unable to provide history. Patient was brought to the emergency department 7/26 for evaluation after a witnessed seizure. His girlfriend stated he had not drink anything for approximately 18 hours leading up to the seizure.  He was noted to be very agitated in the ED and left AMA.   I was able to reach patient's sister who gave me the correct number for patient's significant other listed in the chart.  Phone number was updated in chart. Patient's drank six 40 ounce beers yesterday and was hallucinating at home. He has not been sick recently and has no other medical issues she knows of.   Patient was started on  4 mg Ativan ,scheduled Ativan, and put on CIWA protocol.   Past Medical History  Alcohol use disorder Tobacco use disorder   Significant Hospital Events   Admitted to [facility] 7/28  Consults:  PCCM  Procedures:  na  Significant Diagnostic Tests:  na  Micro Data:  SARS coronavirus 2: Negative  Antimicrobials:  na  Interim history/subjective:  na  Objective   Blood pressure (!) 132/93, pulse (!) 135, temperature 99.3 F (37.4 C), temperature source Oral, resp. rate 18, SpO2 98 %.       No intake or output data in the 24 hours ending 11/06/19 1611 There were no vitals filed for this visit.  Examination: General: Adult male, curled up in bed, talking to someone who is not in the room HENT: [place]/AT, trachea midline Lungs: CTAB, no wheezes or rhonchi Cardiovascular: tachycardic, normal rhythm, no murmurs , rubs or gallops Abdomen: BS+ non distended Extremities: warm , no deformities Neuro: not alert or oriented, hallucinating GU:  Deferred   Noted labs: Creatinine 1.32, ethanol 37, hemoglobin 13.9 CO2 19, anion gap 18 Resolved Hospital Problem list   na  Assessment & Plan:  Jake Nguyen is a 54 year old present with a history of alcohol use disorder presenting with altered mental status likely from alcohol withdrawal.Patient is actively hallucinating and has received 11 mg total of Ativan.  He is going 03/07/19/35 on CIWA since presentation to the ED.  Assessment: AMS Alcohol withdrawal w/ alcoholic hallucinosis AGMA   Plan: - Check beta hydroxybutyric acid given anion gap metabolic acidosis to check for alcoholic ketoacidosis.  -Consider starting some dextrose if patient has alcoholic ketoacidosis, patient already started on Thiamine , Folic acid, and multivitamin  -Given phenobarbital and responded well. - Continue CIWA with Ativan   AKI Hypernatremia Thrombocytopenia - Per primary   Best practice:  Diet: NPO Pain/Anxiety/Delirium protocol (if indicated): S VAP protocol (if indicated): Safety precautions  DVT prophylaxis: Per primary  Glucose control: Per primary Mobility: Bed rest  Code Status: Full Family Communication: Updated significant other Disposition: Progressive  Labs   CBC: Recent Labs  Lab 11/06/19 1200  WBC 8.0  HGB 13.9  HCT 40.0  MCV 85.7  PLT 69*    Basic Metabolic Panel: Recent Labs  Lab 11/06/19 1200  NA 127*  K 5.2*  CL 90*  CO2 19*  GLUCOSE 101*  BUN 9  CREATININE 1.32*  CALCIUM 9.4   GFR: Estimated Creatinine Clearance: 67.2 mL/min (A) (by C-G formula based on SCr of 1.32 mg/dL (H)). Recent  Labs  Lab 11/06/19 1200  WBC 8.0    Liver Function Tests: Recent Labs  Lab 11/06/19 1200  AST 181*  ALT 43  ALKPHOS 63  BILITOT 1.1  PROT 10.3*  ALBUMIN 4.7   No results for input(s): LIPASE, AMYLASE in the last 168 hours. No results for input(s): AMMONIA in the last 168 hours.  ABG    Component Value Date/Time   TCO2 25 08/02/2009 2156      Coagulation Profile: No results for input(s): INR, PROTIME in the last 168 hours.  Cardiac Enzymes: No results for input(s): CKTOTAL, CKMB, CKMBINDEX, TROPONINI in the last 168 hours.  HbA1C: Hgb A1c MFr Bld  Date/Time Value Ref Range Status  05/23/2018 09:52 AM 6.9 (H) 4.8 - 5.6 % Final    Comment:    (NOTE) Pre diabetes:          5.7%-6.4% Diabetes:              >6.4% Glycemic control for   <7.0% adults with diabetes     CBG: No results for input(s): GLUCAP in the last 168 hours.  Review of Systems:   Unable to complete due to ams  Past Medical History  He,  has a past medical history of Alcoholism (HCC).   Surgical History   No past surgical history on file.   Social History   reports that he has been smoking cigarettes. He has been smoking about 0.50 packs per day. He has never used smokeless tobacco. He reports current alcohol use.   Family History   His family history is not on file.   Allergies Allergies  Allergen Reactions  . Pork-Derived Products Other (See Comments)    Pt states he doesn't eat pork     Home Medications  Prior to Admission medications   Medication Sig Start Date End Date Taking? Authorizing Provider  ibuprofen (ADVIL,MOTRIN) 400 MG tablet Take 1 tablet (400 mg total) by mouth 3 (three) times daily as needed for fever. Patient not taking: Reported on 05/24/2019 05/28/18   Jerald Kief, MD  metoprolol tartrate (LOPRESSOR) 25 MG tablet Take 1 tablet (25 mg total) by mouth 2 (two) times daily for 30 days. Patient not taking: Reported on 05/24/2019 05/28/18 06/27/18  Jerald Kief, MD  predniSONE (DELTASONE) 10 MG tablet Taper dose: 40mg  po x 2 days, then 20mg  po daily x 2 days, then 10mg  po daily x 2 days, then 5mg  po daily x 2 days, then stop. Zero refills Patient not taking: Reported on 05/24/2019 05/28/18   , MD     Critical care time:     , MD PGY2 Internal Medicine [phone]

## 2019-11-06 NOTE — ED Notes (Signed)
Pt wanded and changed in to scrubs

## 2019-11-06 NOTE — ED Notes (Signed)
TTS occurring at this time 

## 2019-11-06 NOTE — Progress Notes (Signed)
Got a page from nursing regarding his HR. Nursing noted heart rate jumped up to the 180s, but was not sustained.  Says he is still sleeping comfortably, and heart rate baseline is around 100.  We will order an EKG.   Fayette Pho, MD

## 2019-11-06 NOTE — ED Notes (Signed)
Attempted to call nursing report to the floor  

## 2019-11-06 NOTE — ED Provider Notes (Addendum)
[facility] EMERGENCY DEPARTMENT Provider Note   CSN: [id number] Arrival date & time: 11/06/19  1028     History Chief Complaint  Patient presents with  . Hallucinations  . Alcohol Problem    Per report, Pt experienced withdrawal symptoms    Jake Nguyen is a 53 y.o. male.  Jake Nguyen is a 54 y.o. male with a history of alcoholism, who presents to the emergency department accompanied by [employer] under IVC by significant other for hallucinations.  Patient was seen here in the emergency department 2 days prior after having a witnessed seizure without prior history.  This was felt to likely be related to alcohol withdrawal, but the patient left AMA, stating that he just needed to leave and go drink.  His significant other IVC to him today after he had persistent erratic and disoriented behavior since leaving the hospital 2 days ago.  She states that he is only been drinking beer and has not been eating at all.  She states that he has continued to have hallucinations and has been acting paranoid.  She states that he left the house during the night, and came back bleeding.  She is worried he is a danger to himself or others potentially.  Patient does not appear to be have any open wounds today.  Patient denies any suicidal or homicidal ideations.  Reports he has had 3 beers today and had several beers yesterday.  He is not currently hallucinating.  He denies any focal medical complaints.  Denies any chest pain, palpitations, abdominal pain, nausea or vomiting.  No headaches or visual changes.  Patient stated to nursing staff "I do not want to be here and I know what I need to say and can go through the motions to get discharged".        Past Medical History:  Diagnosis Date  . Alcoholism [facility])     Patient Active Problem List   Diagnosis Date Noted  . Pressure injury of skin 05/25/2018  . Rhabdomyolysis 05/20/2018  . Hyponatremia 05/20/2018  . Elevated troponin  05/20/2018  . Moderate dehydration 05/20/2018  . Alcohol use 05/20/2018  . Influenza A 05/20/2018  . Transaminitis 05/20/2018    No past surgical history on file.     No family history on file.  Social History   Tobacco Use  . Smoking status: Current Every Day Smoker    Packs/day: 0.50    Types: Cigarettes  . Smokeless tobacco: Never Used  Substance Use Topics  . Alcohol use: Yes  . Drug use: Not on file    Home Medications Prior to Admission medications   Medication Sig Start Date End Date Taking? Authorizing Provider  ibuprofen (ADVIL,MOTRIN) 400 MG tablet Take 1 tablet (400 mg total) by mouth 3 (three) times daily as needed for fever. Patient not taking: Reported on 05/24/2019 05/28/18   Jerald Kief, MD  metoprolol tartrate (LOPRESSOR) 25 MG tablet Take 1 tablet (25 mg total) by mouth 2 (two) times daily for 30 days. Patient not taking: Reported on 05/24/2019 05/28/18 06/27/18  Jerald Kief, MD  predniSONE (DELTASONE) 10 MG tablet Taper dose: 40mg  po x 2 days, then 20mg  po daily x 2 days, then 10mg  po daily x 2 days, then 5mg  po daily x 2 days, then stop. Zero refills Patient not taking: Reported on 05/24/2019 05/28/18   , MD    Allergies    Pork-derived products  Review of Systems  Review of Systems  Constitutional: Negative for chills and fever.  HENT: Negative.   Respiratory: Negative for cough and shortness of breath.   Gastrointestinal: Negative for abdominal pain, nausea and vomiting.  Musculoskeletal: Negative for arthralgias and myalgias.  Skin: Negative for color change and rash.  Neurological: Negative for dizziness, syncope, light-headedness and headaches.  Psychiatric/Behavioral: Positive for hallucinations. Negative for agitation, dysphoric mood and suicidal ideas. The patient is not nervous/anxious.   All other systems reviewed and are negative.   Physical Exam Updated Vital Signs BP (!) 118/103 (BP Location: Left Arm)   Pulse 75    Temp 98.3 F (36.8 C) (Oral)   Resp 18   SpO2 100%   Physical Exam Vitals and nursing note reviewed.  Constitutional:      General: He is not in acute distress.    Appearance: Normal appearance. He is well-developed. He is not diaphoretic.     Comments: Alert, well-appearing and in no acute distress  HENT:     Head: Normocephalic and atraumatic.  Eyes:     General:        Right eye: No discharge.        Left eye: No discharge.     Extraocular Movements: Extraocular movements intact.     Pupils: Pupils are equal, round, and reactive to light.  Cardiovascular:     Rate and Rhythm: Normal rate and regular rhythm.     Heart sounds: Normal heart sounds. No murmur heard.  No friction rub. No gallop.   Pulmonary:     Effort: Pulmonary effort is normal. No respiratory distress.     Breath sounds: Normal breath sounds. No wheezing or rales.     Comments: Respirations equal and unlabored, patient able to speak in full sentences, lungs clear to auscultation bilaterally Abdominal:     General: Bowel sounds are normal. There is no distension.     Palpations: Abdomen is soft. There is no mass.     Tenderness: There is no abdominal tenderness. There is no guarding.     Comments: Abdomen soft, nondistended, nontender to palpation in all quadrants without guarding or peritoneal signs  Musculoskeletal:        General: No deformity.     Cervical back: Neck supple.  Skin:    General: Skin is warm and dry.     Capillary Refill: Capillary refill takes less than 2 seconds.  Neurological:     Mental Status: He is alert.     Coordination: Coordination normal.     Comments: Speech is clear, able to follow commands CN III-XII intact Normal strength in upper and lower extremities bilaterally including dorsiflexion and plantar flexion, strong and equal grip strength Sensation normal to light and sharp touch Moves extremities without ataxia, coordination intact   Psychiatric:        Attention and  Perception: He does not perceive auditory or visual hallucinations.        Mood and Affect: Affect is blunt.        Speech: Speech normal. Speech is not tangential.        Behavior: Behavior normal. Behavior is cooperative.        Thought Content: Thought content does not include homicidal or suicidal ideation.     ED Results / Procedures / Treatments   Labs (all labs ordered are listed, but only abnormal results are displayed) Labs Reviewed  COMPREHENSIVE METABOLIC PANEL - Abnormal; Notable for the following components:      Result Value  Sodium 127 (*)    Potassium 5.2 (*)    Chloride 90 (*)    CO2 19 (*)    Glucose, Bld 101 (*)    Creatinine, Ser 1.32 (*)    Total Protein 10.3 (*)    AST 181 (*)    Anion gap 18 (*)    All other components within normal limits  ETHANOL - Abnormal; Notable for the following components:   Alcohol, Ethyl (B) 37 (*)    All other components within normal limits  CBC - Abnormal; Notable for the following components:   Platelets 69 (*)    All other components within normal limits  SARS CORONAVIRUS 2 BY RT PCR ([facility])  SARS CORONAVIRUS 2 BY RT PCR (HOSPITAL ORDER, PERFORMED IN [place] [facility])  RAPID URINE DRUG SCREEN, HOSP PERFORMED   EKG None  Radiology No results found.  Procedures .Critical Care Performed by: Dartha Lodge, PA-C Authorized by: Dartha Lodge, PA-C   Critical care provider statement:    Critical care time (minutes):  45   Critical care was necessary to treat or prevent imminent or life-threatening deterioration of the following conditions: Alcohol withdrawal with delirium.   Critical care was time spent personally by me on the following activities:  Discussions with consultants, evaluation of patient's response to treatment, examination of patient, ordering and performing treatments and interventions, ordering and review of laboratory studies, ordering and review of radiographic studies, pulse oximetry,  re-evaluation of patient's condition, obtaining history from patient or surrogate and review of old charts   (including critical care time)  Medications Ordered in ED Medications  LORazepam (ATIVAN) injection 0-4 mg (1 mg Intravenous Given 11/06/19 1352)    Or  LORazepam (ATIVAN) tablet 0-4 mg ( Oral See Alternative 11/06/19 1352)  LORazepam (ATIVAN) injection 0-4 mg (has no administration in time range)    Or  LORazepam (ATIVAN) tablet 0-4 mg (has no administration in time range)  thiamine tablet 100 mg (has no administration in time range)    Or  thiamine (B-1) injection 100 mg (has no administration in time range)  sodium chloride 0.9 % bolus 1,000 mL (1,000 mLs Intravenous New Bag/Given 11/06/19 1323)  LORazepam (ATIVAN) injection 1 mg (1 mg Intravenous Given 11/06/19 1327)    ED Course  I have reviewed the triage vital signs and the nursing notes.  Pertinent labs & imaging results that were available during my care of the patient were reviewed by me and considered in my medical decision making (see chart for details).    MDM Rules/Calculators/A&P                         54 year old male presents under IVC completed by patient significant other due to hallucinations and erratic behavior at home for the past 2 days after he left AGAINST MEDICAL ADVICE 2 days ago after having a seizure that was felt to likely be related to alcohol withdrawal.  Since leaving the hospital patient has been drinking beer, will not eat or drink anything else.  Has been hallucinating and acting paranoid per the family member, and left the house in the middle of the night.  Patient is currently cooperative and states, I know what I need to do, and can go through the motions so that I can get discharged".  I certainly think that alcohol withdrawal may be contributing patient's symptoms, but at this time his symptoms appear to be fairly mild with a  CIWA score of 9.  Will treat with Ativan and continue to monitor  closely.  Patient does state that he has been drinking for the past 2 days since being discharged from the hospital but today his drink less than he usually does..  He has had no further seizure activity since being discharged 2 days ago.  Denies SI or HI.  Will get medical screening labs and place TTS consult.  First exam paperwork completed.  Medical Screening labs reviewed and interpreted: CBC: No leukocytosis, normal hemoglobin, patient does have low platelets of 69, was previously 100, patient is not having any bleeding symptoms, will have patient follow-up as an outpatient CMP: With sodium of 127, potassium of 5.2 with elevation in creatinine at 1.32 today, CO2 of 19 and slight bump in anion gap at 18, I suspect a lot of this is related to dehydration, will give IV fluid bolus and recheck metabolic panel.  Patient does have slight elevation in AST, other LFTs are unremarkable. Ethanol level of 37 which I suspect is significantly lower than patient's baseline with chronic alcohol use.    TTS consult placed and they are in the process of evaluating the patient.  On reevaluation despite 2 doses of Ativan patient's withdrawal symptoms are significantly worsening he is tremulous and actively hallucinating, with history of alcohol withdrawal seizure 2 days ago he has the high clinical potential to worsen and feel that patient will require medical admission for alcohol withdrawal.  CIWA worsening just in the past 2 hours despite 4mg  in total of Ativan.  Consult placed for medical admission.  Case discussed with family medicine teaching service who will see and admit the patient    Final Clinical Impression(s) / ED Diagnoses Final diagnoses:  Alcohol withdrawal syndrome, with delirium [facility](HCC)    Rx / DC Orders ED Discharge Orders    None       Legrand RamsFord, Ezri Fanguy N, PA-C 11/06/19 1410    Blane OharaZavitz, Joshua, MD 11/06/19 1516    Blane OharaZavitz, Joshua, MD 11/06/19 1535    Jodi GeraldsFord, Sabastien Tyler ClinchportN,  [place]PA-C 11/06/19 1746    Blane OharaZavitz, Joshua, MD 11/12/19 [phone]

## 2019-11-06 NOTE — BH Assessment (Signed)
Comprehensive Clinical Assessment (CCA) Note  11/06/2019 Jake Nguyen [id number]  Visit Diagnosis:   Alcohol Withdrawal; Alcohol Use Disorder, Severe; Cannabis Use Disorder  This patient is a 54 year old gentleman who presented to [facility] under IVC (Petitioner is his Jake Nguyen -- [phone]).   Per hospital note, Ms. Jake Nguyen petitioned for IVC due to Pt's ongoing abuse of alcohol and accompanying symptoms.  Pt was last seen at the ED on 11/04/2019.  At that time, Pt came to the ED following a seizure.  Pt was observed to be agitated, tremulous, and profusely diaphoretic.  Pt left the hospital AMA.    Per hospital note, Ms. Jake Nguyen petitioned for IVC as Pt abuses alcohol and is now hallucinating.  Author left a Architect for Ms. Jake Nguyen.  Author assessed Pt.  Pt stated that he drinks at least a six pack of beer each day and that he has not had beer since yesterday.  Pt stated that he has not had enough to drink over the last three days (''No money''), and as a result, Pt has experienced the following symptoms -- insomnia (no sleep over three days), tremors, nausea, sweating.  Pt denied suicidal ideation, homicidal ideation, and hallucination.  CCA Screening, Triage and Referral (STR)  Patient Reported Information How did you hear about [place]? Family/Friend  Referral name: Jake Nguyen fiancee  Referral phone number: [phone]   Whom do you see for routine medical problems? I don't have a doctor  Practice/Facility Name: No data recorded Practice/Facility Phone Number: No data recorded Name of Contact: No data recorded Contact Number: No data recorded Contact Fax Number: No data recorded Prescriber Name: No data recorded Prescriber Address (if known): No data recorded  What Is the Reason for Your Visit/Call Today? Pt presented under IVC; endorsed withdrawal symptoms  How Long Has This Been Causing You Problems? > than 6 months  What Do You  Feel Would Help You the Most Today? Assessment Only   Have You Recently Been in Any Inpatient Treatment (Hospital/Detox/Crisis Center/28-Day Program)? Yes  Name/Location of Program/Hospital:[facility]  How Long Were You There? one day  When Were You Discharged? 11/03/19   Have You Ever Received Services From [employer] Before? Yes  Who Do You See at [facility]? Pt was assessed due to alcohol use   Have You Recently Had Any Thoughts About Hurting Yourself? No  Are You Planning to Commit Suicide/Harm Yourself At This time? No   Have you Recently Had Thoughts About Hurting Someone [name]? No  Explanation: No data recorded  Have You Used Any Alcohol or Drugs in the Past 24 Hours? Yes  How Long Ago Did You Use Drugs or Alcohol? 1500  What Did You Use and How Much? At least a six pack of beer; unknown quantity of marijuana   Do You Currently Have a Therapist/Psychiatrist? No  Name of Therapist/Psychiatrist: No data recorded  Have You Been Recently Discharged From Any Office Practice or Programs? No  Explanation of Discharge From Practice/Program: No data recorded    CCA Screening Triage Referral Assessment Type of Contact: Tele-Assessment  Is this Initial or Reassessment? Initial Assessment  Date Telepsych consult ordered in [facility]:  11/06/19  Time Telepsych consult ordered in [facility]:  No data recorded  Patient Reported Information Reviewed? Yes  Patient Left Without Being Seen? No data recorded Reason for Not Completing Assessment: No data recorded  Collateral Involvement: Left HIPAA-compliant voice message for Lonia Skinner   Does Patient Have  a Automotive engineer Guardian? No data recorded Name and Contact of Legal Guardian: No data recorded If Minor and Not Nguyen with Parent(s), Who has Custody? No data recorded Is CPS involved or ever been involved? Never  Is APS involved or ever been involved? Never   Patient Determined To Be At Risk for Harm To  Self or Others Based on Review of Patient Reported Information or Presenting Complaint? No  Method: No data recorded Availability of Means: No data recorded Intent: No data recorded Notification Required: No data recorded Additional Information for Danger to Others Potential: No data recorded Additional Comments for Danger to Others Potential: No data recorded Are There Guns or Other Weapons in Your Home? No data recorded Types of Guns/Weapons: No data recorded Are These Weapons Safely Secured?                            No data recorded Who Could Verify You Are Able To Have These Secured: No data recorded Do You Have any Outstanding Charges, Pending Court Dates, Parole/Probation? No data recorded Contacted To Inform of Risk of Harm To Self or Others: No data recorded  Location of Assessment: [facility] ED   Does Patient Present under Involuntary Commitment? Yes  IVC Papers Initial File Date: 11/06/19   [place] of Residence: [place]   Patient Currently Receiving the Following Services: Not Receiving Services   Determination of Need: Emergent (2 hours)   Options For Referral: Chemical Dependency Intensive Outpatient Therapy (CDIOP);Medication Management;BH Urgent Care     CCA Biopsychosocial  Intake/Chief Complaint:  CCA Intake With Chief Complaint Chief Complaint/Presenting Problem: Pt presented to ED under IVC; endorsed withdrawal symptoms Patient's Currently Reported Symptoms/Problems: Tremors, nausea, sweating; insomnia; poor appetite; per IVC, Pt is hallucinating Type of Services Patient Feels Are Needed: None indicated Initial Clinical Notes/Concerns: Pt is a 54 year old male who presented to [facility] under IVC due to ongoing alcohol use and withdrawal symptoms.  Per report, Pt's fiancee, Jake Nguyen, petitioned for IVC as Pt continues to use alcohol and refused treatment/detox three days ago.  Mental Health Symptoms Depression:  Depression: None  Mania:  Mania: None   Anxiety:   Anxiety: None  Psychosis:  Psychosis: None  Trauma:  Trauma: None  Obsessions:  Obsessions: None  Compulsions:  Compulsions: None  Inattention:  Inattention: None  Hyperactivity/Impulsivity:  Hyperactivity/Impulsivity: N/A  Oppositional/Defiant Behaviors:  Oppositional/Defiant Behaviors: None  Emotional Irregularity:  Emotional Irregularity: Potentially harmful impulsivity  Other Mood/Personality Symptoms:  Other Mood/Personality Symptoms: Ongoing use of alcohol and marijuana   Mental Status Exam Appearance and self-care  Stature:  Stature: Average  Weight:  Weight: Average weight  Clothing:  Clothing: Casual  Grooming:  Grooming: Normal  Cosmetic use:  Cosmetic Use: None  Posture/gait:  Posture/Gait: Normal  Motor activity:  Motor Activity: Not Remarkable  Sensorium  Attention:  Attention: Distractible  Concentration:  Concentration: Scattered  Orientation:  Orientation: X5  Recall/memory:  Recall/Memory: Normal  Affect and Mood  Affect:  Affect: Negative  Mood:  Mood: Dysphoric, Negative  Relating  Eye contact:  Eye Contact: Fleeting  Facial expression:  Facial Expression: Responsive  Attitude toward examiner:  Attitude Toward Examiner: Resistant  Thought and Language  Speech flow: Speech Flow: Normal  Thought content:  Thought Content: Appropriate to Mood and Circumstances  Preoccupation:  Preoccupations: None  Hallucinations:  Hallucinations: None (Pt denied; but see notes)  Organization:     Art therapist  Fund of Knowledge:  Fund of Knowledge: Average  Intelligence:  Intelligence: Average  Abstraction:  Abstraction: Normal  Judgement:  Judgement: Poor  Reality Testing:  Reality Testing: Adequate  Insight:  Insight: Poor  Decision Making:  Decision Making: Impulsive  Social Functioning  Social Maturity:  Social Maturity: Irresponsible  Social Judgement:  Social Judgement: Heedless  Stress  Stressors:  Stressors: Relationship (Conflict with  fiancee)  Coping Ability:  Coping Ability: Building surveyor Deficits:  Skill Deficits: Decision making  Supports:  Supports: Family     Religion:    Leisure/Recreation:    Exercise/Diet: Exercise/Diet Do You Have Any Trouble Sleeping?: Yes Explanation of Sleeping Difficulties: Pt stated that he has not slept in three days   CCA Employment/Education  Employment/Work Situation: Employment / Work Situation Employment situation: Unemployed  Education:     CCA Family/Childhood History  Family and Relationship History: Family history Marital status: Long term relationship Additional relationship information: Engaged to [employer] petitioner Are you sexually active?: Yes What is your sexual orientation?: Heterosexual  Childhood History:  Childhood History Did patient suffer from severe childhood neglect?: No Has patient ever been sexually abused/assaulted/raped as an adolescent or adult?: No Was the patient ever a victim of a crime or a disaster?: No Witnessed domestic violence?: No Has patient been affected by domestic violence as an adult?: No  Child/Adolescent Assessment:     CCA Substance Use  Alcohol/Drug Use: Alcohol / Drug Use Pain Medications: See MAR Prescriptions: See MAR Over the Counter: See MAR History of alcohol / drug use?: Yes Negative Consequences of Use: Financial, Personal relationships Withdrawal Symptoms: Agitation, Sweats, Tremors, Nausea / Vomiting, Other (Comment) (Per IVC, Pt experiences hallucinations) Substance #1 Name of Substance 1: Alcohol 1 - Amount (size/oz): At least a six pack 1 - Frequency: Daily 1 - Duration: ongoing 1 - Last Use / Amount: 11/05/2019 -- Not sure of amount; BAC not available on admission Substance #2 Name of Substance 2: Marijuana 2 - Amount (size/oz): Varied 2 - Frequency: Daily 2 - Duration: Ongoing 2 - Last Use / Amount: 11/04/2019 -- not sure                     ASAM's:  Six Dimensions of  Multidimensional Assessment  Dimension 1:  Acute Intoxication and/or Withdrawal Potential:      Dimension 2:  Biomedical Conditions and Complications:      Dimension 3:  Emotional, Behavioral, or Cognitive Conditions and Complications:     Dimension 4:  Readiness to Change:     Dimension 5:  Relapse, Continued use, or Continued Problem Potential:     Dimension 6:  Recovery/Nguyen Environment:     ASAM Severity Score:    ASAM Recommended Level of Treatment:     Substance use Disorder (SUD) Substance Use Disorder (SUD)  Checklist Symptoms of Substance Use: Recurrent use that results in a failure to fulfill major role obligations (work, school, home), Evidence of withdrawal (Comment), Continued use despite persistent or recurrent social, interpersonal problems, caused or exacerbated by use  Recommendations for Services/Supports/Treatments: Recommendations for Services/Supports/Treatments Recommendations For Services/Supports/Treatments: SAIOP (Substance Abuse Intensive Outpatient Program), Medication Management  DSM5 Diagnoses: Patient Active Problem List   Diagnosis Date Noted  . Pressure injury of skin 05/25/2018  . Rhabdomyolysis 05/20/2018  . Hyponatremia 05/20/2018  . Elevated troponin 05/20/2018  . Moderate dehydration 05/20/2018  . Alcohol use 05/20/2018  . Influenza A 05/20/2018  . Transaminitis 05/20/2018    Patient Centered Plan: Patient  is on the following Treatment Plan(s):     Referrals to Alternative Service(s): Referred to Alternative Service(s):   Place:   Date:   Time:    Referred to Alternative Service(s):   Place:   Date:   Time:    Referred to Alternative Service(s):   Place:   Date:   Time:    Referred to Alternative Service(s):   Place:   Date:   Time:     MSE:  During assessment, Pt presented as alert and oriented.Marland Kitchen.  He had fair eye contact.  Demeanor was guarded.  Pt's motor activity appeared agitated.  Pt was in a gown, and he appeared appropriately  groomed.  Mood was irritable and preoccupied with physical symptoms (insomnia, nausea, tremors, loss of appetite).  Affect was also preoccupied.  Pt's speech was normal in rate, rhythm, and volume.  Thought processes were within normal range, and thought content was logical and goal-oriented.  There was no evidence of delusion.  Pt denied hallucination.  Pt's memory and concentration were fair.  Insight, judgment, and impulse control were deemed poor as evidenced by ongoing alcohol use in spite of physical consequences.   DISPOSITION:   As assessment was concluded, advised by attending PA that Pt is being medically admitted.  Dorris FetchEugene T Rosebud Koenen

## 2019-11-06 NOTE — H&P (Addendum)
Family Medicine Teaching [facility] Admission History and Physical Service Pager: [phone]  Patient name: Jake Nguyen Medical record number: [id number] Date of birth: Mar 20, 1966 Age: 54 y.o. Gender: male  Primary Care Provider: Patient, No Pcp Per Consultants: Psychiatry  Code Status: Full Preferred Emergency Contact: Lonia Skinner (fiancee) [phone]  Chief Complaint: alcohol withdrawal and hallucinations   Assessment and Plan: Jake Nguyen is a 54 y.o. male presenting with hallucinations and alcohol withdrawal. PMH is significant for alcoholism.  Worsening Alcohol Withdrawal w/ Hallucinations Patient presents with hallucinations and paranoia after having a recent hospitalization 2 days ago for new-onset, witnessed seizure likely 2/2 alcohol withdrawal. He subsequently left AMA to continue to drink alcohol. This admission he was escorted to the ED by [employer] due to being IVCed by fiancee who was worried for his safety and others because of his odd behavior of leaving the house in the middle of the night and returning bloody. Physical exam limited due to lack of cooperation from patient's withdrawal state, but overall he has some bruising on the left extremity. He is visibly unstable and tremulous. His upper extremities are hyperflexed but he will extend them with passive movement. Only oriented to person. Patient needs consistent redirection and cannot follow commands, responds to what seems like both visual and auditory hallucinations. Did not appear combative or agitated. No known seizures since admission. Ethanol level elevated at 37 but baseline appears to be in the 300-400s. Blood glucose 101, AST 181 and ALT 43. Plts 69, appreciably decreased from prior labs. Anion gap 18. No pertinent imaging performed in ED. Prior to admission he received a total of 7 mg of Ativan due to his worsening withdrawal. CIWAs ranged from 9-20. CCM consulted shortly after admission and provided  recommendation of Phenobabital. CCM aware of patient and will return to assess if patient requires ICU status. For now we will continue management and admit to FPTS, Progressive.  -Admit to progressive, attending Dr. Deirdre Priest -Initially given Ativan 4 mg -scheduled Ativan 2 mg q4-6h  -phenobarbital 130 mg x1 -thiamine and folate, MVI -1L fluid bolus x2 -CIWA q6h -CCM consulted: f/u BHB c/f alcoholic ketoacidosis; consider dextrose if indicated  -continuous cardiac monitoring placed -placed on 1:1 safety observation  -seizure precautions placed -Psych consulted and on board -monitor vitals per floor protocol  -awaiting UDS -counsel on excessive alcohol use once stable  AKI Cr on admission 1.32, baseline appears to be around 0.5. Likely secondary to alcohol abuse. -1L fluid bolus x2, assess after hydration  -Continue to monitor BMP/CMP  Hyponatremia  Na on admission 127, likely dehydration status. History of hyponatremia during previous hospitalizations.  -1L fluid x2 bolus -Monitor for improvement with hydration -monitor mental status changes   Thrombocytopenia Platelet count on admission 69, continues to downtrend since 5 months ago at 100. Likely reactive causes and complication of alcohol abuse. -Continue to monitor CBC -order CBC w/ diff -consider smear and hem consult once stable and if continues to downtrend  FEN/GI: NPO Prophylaxis: SCDs  Disposition: admit to progressive, attending Dr. Deirdre Priest   History of Present Illness:  Jake Nguyen is a 54 y.o. male presenting with hallucinations.  Appears to be experiencing both visual and auditory hallucinations, requires consistent redirection while in the room and does not follow commands. When asked a question, patient responds with illogical responses. When asked where we are, answered with "a chicken box with white walls." Able to say his name when asked, but not able to answer any further  questions including if he is  in any pain currently. Appears tremulous and rigid.   I tried to get in touch with fiancee to determine more information but was not successful. Per chart review, it appears that patient was admitted 2 days ago for first-time seizure likely from alcohol withdrawal and left AMA soon after. Today, patient comes in under IVC by fiancee who is worried about him having worsening withdrawal symptoms and worried for him harming himself or others. Patient has been erratic and disoriented since him leaving the hospital 2 days ago. [name] reports that his only intake has been beer and has not been eating. No other symptoms noted except disorientation and hallucinations. Denies suicidal or homicidal ideations, according to psych note.  History and review of systems limited by patient's current state, most information obtained from chart review.   Review Of Systems: Per HPI with the following additions:   Review of Systems  Psychiatric/Behavioral: Positive for confusion and hallucinations. The patient is hyperactive.      Patient Active Problem List   Diagnosis Date Noted   Pressure injury of skin 05/25/2018   Rhabdomyolysis 05/20/2018   Hyponatremia 05/20/2018   Elevated troponin 05/20/2018   Moderate dehydration 05/20/2018   Alcohol use 05/20/2018   Influenza A 05/20/2018   Transaminitis 05/20/2018    Past Medical History: Past Medical History:  Diagnosis Date   Alcoholism [facility])     Past Surgical History: No past surgical history on file.  Social History: Social History   Tobacco Use   Smoking status: Current Every Day Smoker    Packs/day: 0.50    Types: Cigarettes   Smokeless tobacco: Never Used  Substance Use Topics   Alcohol use: Yes   Drug use: Not on file   Please also refer to relevant sections of EMR.  Family History: No family history on file.   Allergies and Medications: Allergies  Allergen Reactions   Pork-Derived Products Other (See Comments)     Pt states he doesn't eat pork   No current facility-administered medications on file prior to encounter.   Current Outpatient Medications on File Prior to Encounter  Medication Sig Dispense Refill   ibuprofen (ADVIL,MOTRIN) 400 MG tablet Take 1 tablet (400 mg total) by mouth 3 (three) times daily as needed for fever. (Patient not taking: Reported on 05/24/2019) 30 tablet 0   metoprolol tartrate (LOPRESSOR) 25 MG tablet Take 1 tablet (25 mg total) by mouth 2 (two) times daily for 30 days. (Patient not taking: Reported on 05/24/2019) 60 tablet 0   predniSONE (DELTASONE) 10 MG tablet Taper dose: 40mg  po x 2 days, then 20mg  po daily x 2 days, then 10mg  po daily x 2 days, then 5mg  po daily x 2 days, then stop. Zero refills (Patient not taking: Reported on 05/24/2019)      Objective: BP (!) 118/103 (BP Location: Left Arm)    Pulse 75    Temp 98.3 F (36.8 C) (Oral)    Resp 18    SpO2 100%   Exam: General: Alert to voice, in no acute distress. Engaging in visual and auditory hallucinations. Neck: no JVD noted Cardiovascular: tachycardic, no arrhythmias, murmurs or gallops appreciated  Respiratory: lungs clear to auscultation bilaterally  Gastrointestinal: nontender on palpation, active bowel sounds  MSK: increased tone and rigidity of UE bilaterally, distal pulses intact bilaterally, no LE edema noted bilaterally  Derm: warm and dry to touch, moderate xerosis noted in LE bilaterally  Neuro: AOx1 (person only) Psych: auditory and  visual hallucinations, requires constant redirection, no agitation noted  Labs and Imaging: CBC BMET  Recent Labs  Lab 11/06/19 1200  WBC 8.0  HGB 13.9  HCT 40.0  PLT 69*   Recent Labs  Lab 11/06/19 1200  NA 127*  K 5.2*  CL 90*  CO2 19*  BUN 9  CREATININE 1.32*  GLUCOSE 101*  CALCIUM 9.4     EKG: sinus tachycardic, shortened QT interval   No results found.  Reece Leader, DO 11/06/2019, 1:59 PM PGY-1, [facility] Family Medicine FPTS Intern  pager: [phone], text pages welcome  FPTS Upper-Level Resident Addendum I have independently interviewed and examined the patient. I have discussed the above with the original author and agree with their documentation. My edits for correction/addition/clarification are in PINK. Please see also any attending notes.  Lavonda Jumbo, DO PGY-2, [facility] 11/06/2019 6:06 PM  FPTS Service pager: [phone] (text pages welcome through [facility])

## 2019-11-07 LAB — COMPREHENSIVE METABOLIC PANEL
ALT: 33 U/L (ref 0–44)
AST: 113 U/L — ABNORMAL HIGH (ref 15–41)
Albumin: 3.5 g/dL (ref 3.5–5.0)
Alkaline Phosphatase: 44 U/L (ref 38–126)
Anion gap: 12 (ref 5–15)
BUN: 12 mg/dL (ref 6–20)
CO2: 22 mmol/L (ref 22–32)
Calcium: 8.2 mg/dL — ABNORMAL LOW (ref 8.9–10.3)
Chloride: 98 mmol/L (ref 98–111)
Creatinine, Ser: 0.8 mg/dL (ref 0.61–1.24)
GFR calc Af Amer: >60 mL/min (ref >60)
GFR calc non Af Amer: >60 mL/min (ref >60)
Glucose, Bld: 87 mg/dL (ref 70–99)
Potassium: 3.3 mmol/L — ABNORMAL LOW (ref 3.5–5.1)
Sodium: 132 mmol/L — ABNORMAL LOW (ref 135–145)
Total Bilirubin: 1 mg/dL (ref 0.3–1.2)
Total Protein: 8 g/dL (ref 6.5–8.1)

## 2019-11-07 LAB — CBC
HCT: 34.7 % — ABNORMAL LOW (ref 39.0–52.0)
Hemoglobin: 11.7 g/dL — ABNORMAL LOW (ref 13.0–17.0)
MCH: 29.4 pg (ref 26.0–34.0)
MCHC: 33.7 g/dL (ref 30.0–36.0)
MCV: 87.2 fL (ref 80.0–100.0)
Platelets: 64 10*3/uL — ABNORMAL LOW (ref 150–400)
RBC: 3.98 MIL/uL — ABNORMAL LOW (ref 4.22–5.81)
RDW: 14.6 % (ref 11.5–15.5)
WBC: 5.4 10*3/uL (ref 4.0–10.5)
nRBC: 0 % (ref 0.0–0.2)

## 2019-11-07 LAB — PROTIME-INR
INR: 1.1 (ref 0.8–1.2)
Prothrombin Time: 13.4 seconds (ref 11.4–15.2)

## 2019-11-07 MED ORDER — POTASSIUM CHLORIDE CRYS ER 20 MEQ PO TBCR
40.0000 meq | EXTENDED_RELEASE_TABLET | Freq: Once | ORAL | Status: AC
  Administered 2019-11-07: 40 meq via ORAL
  Filled 2019-11-07: qty 2

## 2019-11-07 MED ORDER — NICOTINE 14 MG/24HR TD PT24
14.0000 mg | MEDICATED_PATCH | Freq: Every day | TRANSDERMAL | Status: DC
  Administered 2019-11-08 – 2019-11-12 (×4): 14 mg via TRANSDERMAL
  Filled 2019-11-07 (×5): qty 1

## 2019-11-07 MED ORDER — DEXTROSE-NACL 5-0.9 % IV SOLN: INTRAVENOUS | Status: AC

## 2019-11-07 MED ORDER — POTASSIUM CHLORIDE 10 MEQ/100ML IV SOLN: 10.0000 meq | INTRAVENOUS | Status: DC

## 2019-11-07 MED ORDER — ENOXAPARIN SODIUM 40 MG/0.4ML ~~LOC~~ SOLN
40.0000 mg | SUBCUTANEOUS | Status: DC
  Administered 2019-11-07 – 2019-11-12 (×5): 40 mg via SUBCUTANEOUS
  Filled 2019-11-07 (×5): qty 0.4

## 2019-11-07 NOTE — Progress Notes (Signed)
Abdominal ultra sound ordered for which Pt must be NPO for 6 hrs prior.  At 1500 call made to U/S dept, revealing a possible wait of several more hours.  Pt is coming around and asking to eat so permission requested and received to delay U/S to tomorrow and allow Pt to eat this afternoon/evening.    Several notes indicate the Pt's SO wishes Pt to be IVC'd this admission; however there is no official document to confirm this status.  Call made to Rae Lips, who states she will bring a copy from the magistrate later today.

## 2019-11-07 NOTE — Progress Notes (Signed)
FPTS Interim Progress Note  Patient seen and examined at bedside for PM check.  Patient denies any complaints, states "I am still here."  Nurse reports that CIWA just checked, score of 3.  Patient does not seem to be obviously hallucinating or tremulous on examination.  We will continue to monitor on CIWA's closely.  Dessie Tatem, Solmon Ice, DO 11/07/2019, 9:04 PM PGY-3, [facility] Family Medicine Service pager [phone]

## 2019-11-07 NOTE — Progress Notes (Signed)
Family Medicine Teaching Service Daily Progress Note Intern Pager: [phone]  Patient name: Jake Nguyen Medical record number: [id number] Date of birth: 02/11/1966 Age: 54 y.o. Gender: male  Primary Care Provider: Patient, No Pcp Per Consultants: Psychiatry Code Status: Full  Pt Overview and Major Events to Date:  Admitted 11/06/2019  Assessment and Plan: Jake Nguyen is a 54 y.o. male presenting with hallucinations and alcohol withdrawal. PMH is significant for alcoholism.  Alcohol Withdrawal w/ Hallucinations Improving. Patient presents with hallucinations and paranoia after having a recent hospitalization 2 days ago for new-onset, witnessed seizure likely 2/2 alcohol withdrawal. He subsequently left AMA to continue to drink alcohol. IVCed by fiancee for concern for safety of patient and others. Ethanol level elevated at 37 but baseline appears to be in the 300-400s. Blood glucose 87, AST 113 and ALT 33. Patient is alert and able to follow commands, denies experiencing any auditory or visual hallucinations. -scheduled Ativan 2 mg q4 -CIWA q6h, Ativan as needed -placed on 1:1 safety observation  -seizure precautions placed -awaiting UDS -complete abdominal US ordered, no recent abdominal imaging noted -continue to monitor for further withdrawal symptoms, including tremors -counsel on excessive alcohol use once stable  AKI Likely resolved. Cr 0.8 today. Cr on admission 1.32, baseline appears to be around 0.5. Likely secondary to alcohol abuse. -Continue to monitor BMP/CMP  Hyponatremia  Na 132 today, improved since admission at 127, likely dehydration status. History of hyponatremia during previous hospitalizations.  -1L fluid x2 bolus -Monitor for improvement with hydration -monitor mental status changes  -Continue to monitor BMP  Hypokalemia  Admitted for hyperkalemia with 5.2, with fluid bolus this morning K 3.3  - given -continue to monitor  BMP/CMP  Thrombocytopenia Platelet count 64 today. Platelet count on admission 69, continues to downtrend since 5 months ago at 100. Likely reactive causes and complication of alcohol abuse. -Continue to monitor CBC -consider CBC w/ diff -consider smear and hem consult once stable and if continues to downtrend  Tobacco use Patient smokes half pack/day.  -Nicotine patch  FEN/GI: Regular diet PPx: SCDs  Disposition: Inpatient   Subjective:  Overnight events include tachycardic and went back to baseline in 100s. Given Ativan 2mg  twice. Given D5NSat 1/101mIVF for 7 hrs since betahydroxybutyrate was 0.77 yesterday. Denies any chest pain, dyspnea, generalized or localized pain. He knows where he is and states that his fiancee put him in the hospital. Denies suicidal or homicidal ideations. Denies experiencing auditory or visual hallucinations.   Objective: Temp:  [97.8 F (36.6 C)-99.6 F (37.6 C)] 98.1 F (36.7 C) (07/29 0400) Pulse Rate:  [75-135] 91 (07/29 0400) Resp:  [14-31] 26 (07/29 0400) BP: (107-132)/(67-103) 128/88 (07/29 0400) SpO2:  [92 %-100 %] 92 % (07/29 0400) Weight:  07[birth year]28-1989 kg] 69 kg (07/28 1954) Physical Exam: General: Patient laying comfortably, in no acute distress. Cardiovascular: regular rate and rhythm, no murmurs or gallops noted  Respiratory: lungs clear to auscultation bilaterally anteriorly  Abdomen: nontender, nondistended, active bowel sounds  Extremities: no LE edema present, distal pulses intact bilaterally, moderate xerosis noted LE bilaterally  Neuro: AOx4, CN2-12 grossly intact, 5/5 LE strength bilaterally  Psych: denies suicidal or homicidal ideations, no agitation or combativeness noted  Laboratory: Recent Labs  Lab 11/06/19 1200 11/07/19 0049  WBC 8.0 5.4  HGB 13.9 11.7*  HCT 40.0 34.7*  PLT 69* 64*   Recent Labs  Lab 11/06/19 1200 11/07/19 0049  NA 127* 132*  K 5.2* 3.3*  CL 90* 98  CO2 19* 22  BUN 9 12  CREATININE 1.32* 0.80   CALCIUM 9.4 8.2*  PROT 10.3* 8.0  BILITOT 1.1 1.0  ALKPHOS 63 44  ALT 43 33  AST 181* 113*  GLUCOSE 101* 87    EKG 7/28 demonstrated sinus tachycardia with inverted T waves and low voltage QRS, shortened QT interval, unchanged from previous  Imaging/Diagnostic Tests: No results found.  Reece Leader, DO 11/07/2019, 6:49 AM PGY-1, [facility] Family Medicine FPTS Intern pager: [phone], text pages welcome

## 2019-11-07 NOTE — Progress Notes (Addendum)
EKG with sinus tachycardia, unchanged from previous.  Will continue to monitor.  Could be having intermittent tachycardia with withdrawal.  Also BHOB mildly elevated at 0.77.  Given anion gap, likely alcoholic ketoacidosis.  Has received thiamine, therefore will give 1/2 mIVF of D5NS overnight x7 hrs.  Luis Abed, D.O.  PGY-3 Family Medicine  11/07/2019 12:01 AM

## 2019-11-08 ENCOUNTER — Other Ambulatory Visit (HOSPITAL_COMMUNITY): Payer: Self-pay

## 2019-11-08 DIAGNOSIS — F1020 Alcohol dependence, uncomplicated: Secondary | ICD-10-CM

## 2019-11-08 DIAGNOSIS — D696 Thrombocytopenia, unspecified: Secondary | ICD-10-CM

## 2019-11-08 LAB — CBC WITH DIFFERENTIAL/PLATELET
Abs Immature Granulocytes: 0.02 10*3/uL (ref 0.00–0.07)
Basophils Absolute: 0 10*3/uL (ref 0.0–0.1)
Basophils Relative: 1 %
Eosinophils Absolute: 0 10*3/uL (ref 0.0–0.5)
Eosinophils Relative: 1 %
HCT: 35.6 % — ABNORMAL LOW (ref 39.0–52.0)
Hemoglobin: 11.9 g/dL — ABNORMAL LOW (ref 13.0–17.0)
Immature Granulocytes: 0 %
Lymphocytes Relative: 35 %
Lymphs Abs: 1.9 10*3/uL (ref 0.7–4.0)
MCH: 30 pg (ref 26.0–34.0)
MCHC: 33.4 g/dL (ref 30.0–36.0)
MCV: 89.7 fL (ref 80.0–100.0)
Monocytes Absolute: 0.9 10*3/uL (ref 0.1–1.0)
Monocytes Relative: 16 %
Neutro Abs: 2.6 10*3/uL (ref 1.7–7.7)
Neutrophils Relative %: 47 %
Platelets: 92 10*3/uL — ABNORMAL LOW (ref 150–400)
RBC: 3.97 MIL/uL — ABNORMAL LOW (ref 4.22–5.81)
RDW: 14.2 % (ref 11.5–15.5)
WBC: 5.5 10*3/uL (ref 4.0–10.5)
nRBC: 0 % (ref 0.0–0.2)

## 2019-11-08 LAB — BASIC METABOLIC PANEL
Anion gap: 9 (ref 5–15)
BUN: 8 mg/dL (ref 6–20)
CO2: 26 mmol/L (ref 22–32)
Calcium: 8.5 mg/dL — ABNORMAL LOW (ref 8.9–10.3)
Chloride: 98 mmol/L (ref 98–111)
Creatinine, Ser: 0.63 mg/dL (ref 0.61–1.24)
GFR calc Af Amer: >60 mL/min (ref >60)
GFR calc non Af Amer: >60 mL/min (ref >60)
Glucose, Bld: 104 mg/dL — ABNORMAL HIGH (ref 70–99)
Potassium: 3.4 mmol/L — ABNORMAL LOW (ref 3.5–5.1)
Sodium: 133 mmol/L — ABNORMAL LOW (ref 135–145)

## 2019-11-08 LAB — RAPID URINE DRUG SCREEN, HOSP PERFORMED
Amphetamines: NOT DETECTED
Barbiturates: POSITIVE — AB
Benzodiazepines: POSITIVE — AB
Cocaine: NOT DETECTED
Opiates: NOT DETECTED
Tetrahydrocannabinol: POSITIVE — AB

## 2019-11-08 LAB — MAGNESIUM: Magnesium: 2.1 mg/dL (ref 1.7–2.4)

## 2019-11-08 MED ORDER — WHITE PETROLATUM EX OINT: TOPICAL_OINTMENT | CUTANEOUS | Status: DC | PRN

## 2019-11-08 MED ORDER — FOLIC ACID 5 MG/ML IJ SOLN
1.0000 mg | Freq: Every day | INTRAMUSCULAR | Status: DC
  Filled 2019-11-08 (×5): qty 0.2

## 2019-11-08 MED ORDER — FOLIC ACID 1 MG PO TABS
1.0000 mg | ORAL_TABLET | Freq: Every day | ORAL | Status: DC
  Administered 2019-11-10 – 2019-11-12 (×3): 1 mg via ORAL
  Filled 2019-11-08 (×3): qty 1

## 2019-11-08 MED ORDER — LORAZEPAM 2 MG/ML IJ SOLN
2.0000 mg | Freq: Four times a day (QID) | INTRAMUSCULAR | Status: DC
  Administered 2019-11-08 – 2019-11-11 (×8): 2 mg via INTRAVENOUS
  Filled 2019-11-08 (×10): qty 1

## 2019-11-08 MED ORDER — POTASSIUM CHLORIDE CRYS ER 20 MEQ PO TBCR
40.0000 meq | EXTENDED_RELEASE_TABLET | Freq: Once | ORAL | Status: AC
  Administered 2019-11-08: 40 meq via ORAL
  Filled 2019-11-08: qty 2

## 2019-11-08 NOTE — Progress Notes (Signed)
Received page that patient was irate wanting to leave and that security was present.  Went to evaluate at bedside.  RN and security officers outside patient's room questioning if IVC in place as fianc was adamant and recently just left to go back to the magistrate's office. Of note, we had already investigated and noted that official IVC completion was not obtained.  Went to discuss with patient, sitting at his desk eating pineapple. Tearful and appeared frustrated, gave him space.   No official IVC reconfirmed via ED and security left.  Went back to speak with the patient again, now laying in bed.  He reports he just wants to go home because his rent is due on the third.  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  He does not have a primary care provider and does not follow with anyone, felt like he just never needed to.  In reference to alcohol, says he has had too many times where he has needed medical care and is going to quit again.  He said back in ~2009 he quit for 2 years until his mother passed away of a heart attack in 2011.  Shortly after that his brother passed away in his sleep due to a presumed asthma attack.  He has never tried counseling/therapy or medications to ease his depression, but does enjoy playing basketball and football.   He asked why we wanted the ultrasound and if we could go ahead and do this now so that he could leave afterwards.  I discussed we are worried for his liver (i.e. cirrhosis) and that we postponed so that he could eat.  Additionally discussed alcohol withdrawal if he were to leave and that it would be a good idea to at least stay through tomorrow to allow Ativan to taper.  He again reiterated that he understood that seizures and death could be possible.  At this point he became very frustrated and stated "I don't want to talk anymore if you're telling me I can't leave tonight."  Informed him that he  is not forced to stay but would be recommended and that I am available if he would like to talk any further.  Left the room with his safety sitter present.  Allayne Stack, DO

## 2019-11-08 NOTE — Progress Notes (Signed)
**  NO IVC IN PLACE**  Girlfriend/fianc at bedside reported to our attending, Dr. Jennette Kettle, that she was just at the [facility] office this morning and provided [place] with a card to call and that they could "help [place] out "to get the paperwork.  Reach out to phone number provided, Judson Roch with the magistrate's office.  She reports that indeed the patient's fianc was at the office earlier this morning, but informed us that they do not keep any copy of the IVC paperwork if there was one and cannot help.  Additionally, we received a call back from Tom at [facility] who reported that in fact no official IVC paperwork was completed.  States it was considered, but not officially completed due to behavior being 2/2 medical reasoning.  At this point, the patient is alert and oriented. He is able to state the risks associated including seizure, coma, or death if he decides to leave AGAINST MEDICAL ADVICE.  Currently, if patient does decide to leave he should not be held as no IVC in place and has capacity for medical decision-making.  Will continually reassess.   Allayne Stack, DO

## 2019-11-08 NOTE — Progress Notes (Signed)
FPTS Interim Progress Note Spoke with Berneice Heinrich [facility] NP. She is not covering for my patient currently but agreed to help out with a question about IVC paperwork. She states there is no timing on IVC paperwork it just allows the patient to be evaluated and then the IVC can be either rescinded or upheld. The actual paperwork should be in either media (which we both agreed and visually reviewed it was not) or on the paper chart (which it was not). Next steps to be sure that this paperwork is factual would be to attempt to get in touch with the Magistrate office via Tom. Attempted to call Tom at [phone]. No answer. Left voicemail with call back number.   Lavonda Jumbo, DO 11/08/2019, 12:05 PM PGY-2, [facility] Family Medicine Service pager [phone]

## 2019-11-08 NOTE — Hospital Course (Addendum)
Patient presented on 7/28 with hallucinations, secondary to alcohol withdrawal. Poor historian at admission due to withdrawal state. Appeared to be experiencing auditory and visual hallucinations. Physical exam limited due to patient unable to follow commands. Ethanol 37, baseline appears to be in the 300-400s. AST 181, ALT 43, platelet count 69, anion gap 18. No pertinent imaging performed in ED. Ativan 4 mg given, followed by Ativan 2 mg every 4 hours followed by Ativan 2 mg every 6 hours. CCM consulted and recommended a dose of phenobarbital 130 mg. Folate and thiamine given. BHB 0.77, given D5NS, half maintenance fluids for 7 hours. Patient alert and oriented the next morning, denies experiencing auditory and visual hallucinations as well as suicidal or homicidal ideations. Throughout the remainder of his hospital course he continued to progress until discharge.

## 2019-11-08 NOTE — Progress Notes (Signed)
Family Medicine Teaching Service Daily Progress Note Intern Pager: [phone]  Patient name: Jake Nguyen Medical record number: [id number] Date of birth: 30-Oct-1965 Age: 54 y.o. Gender: male  Primary Care Provider: Patient, No Pcp Per Consultants: Psychiatry  Code Status: Full  Pt Overview and Major Events to Date:  Admitted on 11/06/2019  Assessment and Plan: JSON KOELZER a 54 y.o.malepresenting with hallucinations and alcohol withdrawal. PMH is significant foralcoholism.  Alcohol Withdrawal w/ Hallucinations Improving. Patient is alert and able to follow commands, denies experiencing any auditory or visual hallucinations. Slight tremor noted on finger to nose testing but of less concern considering his alcohol withdrawal state. Otherwise unremarkable neuro exam. UDS positive for THC. Additionally, I was notified today that patient was in fact not ICVed upon admission. I had made this assumption from reading previous notes in the ED and from psychiatry. At the time of admission, patient was a poor historian given his withdrawal state and was not following many commands. Therefore, I was not able to gather all the appropriate details to verify.  -scheduled Ativan 2 mg q6 -CIWA -folate -placed on 1:1safetyobservation -seizure precautionsplaced -cancelled complete abdominal US since patient agitated about being NPO and wanted to make him comfortable so he can eat and continue his hospital course. Patient transitioned from NPO to regular diet.  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 Patient uses lotion at home. -Vaseline  Hyponatremia Improving. Na 133 today, improved since admission at 127, likelydehydration status upon arrival.History of hyponatremia during previous hospitalizations. -monitor mental status changes -Continue to monitor  BMP  Hypokalemia  Admitted for hyperkalemia with 5.2. This morning hypokalemic, K 3.4. - PO given -continue to monitor BMP/CMP  Thrombocytopenia Platelet count 92 today. All cell lines trending down, likely related to volume status. Platelet count on admission 69, continues to downtrend since 5 months ago at 100. Likely reactivecausesand complication of alcohol abuse. -Continue to monitor CBC  -consider smear and hem consultonce stable andif continues to downtrend but likely due to excessive alcohol intake   Tobacco use Patient smokes half pack/day -Nicotine patch  FEN/GI: Regular diet  PPx: Lovenox  Disposition: Inpatient with close monitoring   Subjective:  Patient seen and examined at bedside. Appears pleasant, states that he is hungry but is NPO for abdominal imaging today. Reports getting up to use the restroom and had a BM this morning. Denies chest pain, dyspnea, headache, blurry vision, dizziness and any generalized or localized pain. He denies experiencing any hallucinations.  Objective: Temp:  [98.6 F (37 C)-99.3 F (37.4 C)] (P) 98.6 F (37 C) (07/30 0426) Pulse Rate:  [64-90] 64 (07/30 0413) Resp:  [13-29] 19 (07/30 0413) BP: (103-134)/(70-89) 120/73 (07/30 0500) SpO2:  [95 %-100 %] 100 % (07/30 0413) Physical Exam: General: Patient sitting upright, in no acute distress. Cardiovascular: regular rate and rhythm, no murmurs or gallops noted Respiratory: lungs clear to auscultation bilaterally, no rhonchi or rales noted  Abdomen: nontender, nondistended, active bowel sounds present  Extremities: no LE edema noted, distal pulses intact bilaterally  Neuro: AOx4, CN2-12 grossly intact, UE sensation intact, 5/5 strength UE and LE bilaterally, slight tremor noted on finger to nose testing bilaterally with left side a little worse Psych: mood appropriate, no agitation, denies suicidal or homicidal ideations, denies both auditory and visual hallucinations    Laboratory: Recent Labs  Lab 11/06/19 1200 11/07/19 0049 11/08/19 0421  WBC 8.0  5.4 5.5  HGB 13.9 11.7* 11.9*  HCT 40.0 34.7* 35.6*  PLT 69* 64* 92*   Recent Labs  Lab 11/06/19 1200 11/07/19 0049 11/08/19 0421  NA 127* 132* 133*  K 5.2* 3.3* 3.4*  CL 90* 98 98  CO2 19* 22 26  BUN 9 12 8   CREATININE 1.32* 0.80 0.63  CALCIUM 9.4 8.2* 8.5*  PROT 10.3* 8.0  --   BILITOT 1.1 1.0  --   ALKPHOS 63 44  --   ALT 43 33  --   AST 181* 113*  --   GLUCOSE 101* 87 104*      Imaging/Diagnostic Tests: No results found.  , DO 11/08/2019, 6:30 AM PGY-1, [facility] Family Medicine FPTS Intern pager: [phone], text pages welcome

## 2019-11-08 NOTE — Progress Notes (Signed)
Pt has been cooperative, ordered dinner and ate his meal. Made phone calls to family members. Tearful on and off, is not ready to share why he is hurt at this time. Requesting prayers for well being/ healing.

## 2019-11-09 ENCOUNTER — Inpatient Hospital Stay (HOSPITAL_COMMUNITY): Payer: Self-pay

## 2019-11-09 DIAGNOSIS — IMO0001 Reserved for inherently not codable concepts without codable children: Secondary | ICD-10-CM

## 2019-11-09 LAB — CBC WITH DIFFERENTIAL/PLATELET
Abs Immature Granulocytes: 0.01 10*3/uL (ref 0.00–0.07)
Basophils Absolute: 0.1 10*3/uL (ref 0.0–0.1)
Basophils Relative: 1 %
Eosinophils Absolute: 0 10*3/uL (ref 0.0–0.5)
Eosinophils Relative: 1 %
HCT: 39.9 % (ref 39.0–52.0)
Hemoglobin: 13.2 g/dL (ref 13.0–17.0)
Immature Granulocytes: 0 %
Lymphocytes Relative: 31 %
Lymphs Abs: 1.8 10*3/uL (ref 0.7–4.0)
MCH: 30.1 pg (ref 26.0–34.0)
MCHC: 33.1 g/dL (ref 30.0–36.0)
MCV: 91.1 fL (ref 80.0–100.0)
Monocytes Absolute: 1.1 10*3/uL — ABNORMAL HIGH (ref 0.1–1.0)
Monocytes Relative: 19 %
Neutro Abs: 2.9 10*3/uL (ref 1.7–7.7)
Neutrophils Relative %: 48 %
Platelets: 116 10*3/uL — ABNORMAL LOW (ref 150–400)
RBC: 4.38 MIL/uL (ref 4.22–5.81)
RDW: 14.2 % (ref 11.5–15.5)
WBC: 6 10*3/uL (ref 4.0–10.5)
nRBC: 0 % (ref 0.0–0.2)

## 2019-11-09 LAB — BASIC METABOLIC PANEL
Anion gap: 10 (ref 5–15)
BUN: 5 mg/dL — ABNORMAL LOW (ref 6–20)
CO2: 23 mmol/L (ref 22–32)
Calcium: 9.1 mg/dL (ref 8.9–10.3)
Chloride: 100 mmol/L (ref 98–111)
Creatinine, Ser: 0.62 mg/dL (ref 0.61–1.24)
GFR calc Af Amer: >60 mL/min (ref >60)
GFR calc non Af Amer: >60 mL/min (ref >60)
Glucose, Bld: 112 mg/dL — ABNORMAL HIGH (ref 70–99)
Potassium: 4.3 mmol/L (ref 3.5–5.1)
Sodium: 133 mmol/L — ABNORMAL LOW (ref 135–145)

## 2019-11-09 MED ORDER — PHENOBARBITAL SODIUM 130 MG/ML IJ SOLN
130.0000 mg | INTRAMUSCULAR | Status: AC
  Administered 2019-11-09: 130 mg via INTRAVENOUS
  Filled 2019-11-09: qty 1

## 2019-11-09 MED ORDER — HALOPERIDOL LACTATE 5 MG/ML IJ SOLN: 1.0000 mg | Freq: Once | INTRAMUSCULAR | Status: DC

## 2019-11-09 MED ORDER — LORAZEPAM BOLUS VIA INFUSION: 6.0000 mg | INTRAVENOUS | Status: DC | PRN

## 2019-11-09 MED ORDER — LORAZEPAM 2 MG/ML IJ SOLN: 6.0000 mg | INTRAMUSCULAR | Status: DC | PRN

## 2019-11-09 MED ORDER — HALOPERIDOL LACTATE 5 MG/ML IJ SOLN
1.0000 mg | Freq: Once | INTRAMUSCULAR | Status: AC | PRN
  Administered 2019-11-09: 1 mg via INTRAMUSCULAR
  Filled 2019-11-09: qty 1

## 2019-11-09 MED ORDER — HALOPERIDOL 0.5 MG PO TABS
0.5000 mg | ORAL_TABLET | Freq: Once | ORAL | Status: AC | PRN
  Filled 2019-11-09: qty 1

## 2019-11-09 MED ORDER — HALOPERIDOL 0.5 MG PO TABS: 0.5000 mg | ORAL_TABLET | Freq: Once | ORAL | Status: DC

## 2019-11-09 MED ORDER — HALOPERIDOL 1 MG PO TABS
4.0000 mg | ORAL_TABLET | Freq: Once | ORAL | Status: AC
  Filled 2019-11-09: qty 4

## 2019-11-09 MED ORDER — HALOPERIDOL LACTATE 5 MG/ML IJ SOLN
4.0000 mg | Freq: Once | INTRAMUSCULAR | Status: AC
  Administered 2019-11-09: 4 mg via INTRAMUSCULAR
  Filled 2019-11-09: qty 1

## 2019-11-09 NOTE — Progress Notes (Signed)
Patient noted removing LLE restraint, mittens and loosing wrist restraints.  Patient pulling and jerking arm stating he is going to break out of them.    Patient yelling & cursing

## 2019-11-09 NOTE — Progress Notes (Signed)
Pt resting comfortably in bed at 1300 and agreed to allow cardiac monitoring.  CCMD notified. Approximately 1305, an individual addressed as "cuz" walked in room and patient became increasingly agitated and self-released waist restraint.  Patient then noted staggering, attempted to leave and was assisted by multiple staff members as not to fall.  Pt began swinging and striking staff.  Another individual, while on video chat, arrived and began yelling out "don't touch him".  " what did you give him?"   Security notified/MD notified/AC notified.  All staff present & notified visitors that all visitation has been halted.   Pt's emergency contact would be able to give them information and updates or they can speak to the patient via room phone or his personal phone.   Information desk notified by security

## 2019-11-09 NOTE — Progress Notes (Signed)
NAME:  Jake Nguyen, MRN:  [id number], DOB:  1966-03-31, LOS: 3 ADMISSION DATE:  11/06/2019, CONSULTATION DATE:  11/06/2019 REFERRING MD:  Deirdre Priest, CHIEF COMPLAINT:  Alcohol withdrawal   Brief History   Patient currently having hallucinations and unable to provide history. Patient was brought to the emergency department 7/26 for evaluation after a witnessed seizure. His girlfriend stated he had not drink anything for approximately 18 hours leading up to the seizure.  He was noted to be very agitated in the ED and left AMA.  Was initially seen by [facility] 7/28; recommended hi-dose Ativan CIWA, phenobarb for breakthrough, but recognized patient may need Precedex.  PCCM called today for increased agitation not responsive to Ativan 4 mg IV or phenobarb. The RN reports that the patient can be relatively calm one minute, then markedly agitated the next.  Past Medical History  Alcohol use disorder Tobacco use disorder   Interim history/subjective:  On my interview, the patient acknowledges no c/o other than a desire to sit up in bed.  Objective   Blood pressure (!) 138/93, pulse (!) 114, temperature 98.4 F (36.9 C), temperature source Oral, resp. rate 20, weight 69 kg, SpO2 100 %.        Intake/Output Summary (Last 24 hours) at 11/09/2019 1732 Last data filed at 11/09/2019 1533 Gross per 24 hour  Intake 960 ml  Output 852 ml  Net 108 ml   Filed Weights   11/06/19 1954  Weight: 69 kg    Examination: General: WD/WN B M NAD and  HENT: [place]/AT PERRL EOMI Lungs: Clear bilaterally Cardiovascular: RRR w/o m/r/g Abdomen: Supple, NT Extremities: No c/c/e Neuro: Does not know date but knows presidents from [place] to [place]  Assessment & Plan:  Alc withdrawal. Will give Ativan 6 mg to see level of effect and observe for possibility of over sedation. If tolerated, would use this as a prn dose; otherwise, would manage with Precedex.   Labs   CBC: Recent Labs  Lab 11/06/19 1200  11/07/19 0049 11/08/19 0421 11/09/19 0202  WBC 8.0 5.4 5.5 6.0  NEUTROABS  --   --  2.6 2.9  HGB 13.9 11.7* 11.9* 13.2  HCT 40.0 34.7* 35.6* 39.9  MCV 85.7 87.2 89.7 91.1  PLT 69* 64* 92* 116*    Basic Metabolic Panel: Recent Labs  Lab 11/06/19 1200 11/07/19 0049 11/08/19 0421 11/09/19 0202  NA 127* 132* 133* 133*  K 5.2* 3.3* 3.4* 4.3  CL 90* 98 98 100  CO2 19* 22 26 23   GLUCOSE 101* 87 104* 112*  BUN 9 12 8  5*  CREATININE 1.32* 0.80 0.63 0.62  CALCIUM 9.4 8.2* 8.5* 9.1  MG  --   --  2.1  --    GFR: Estimated Creatinine Clearance: 105.4 mL/min (by C-G formula based on SCr of 0.62 mg/dL). Recent Labs  Lab 11/06/19 1200 11/07/19 0049 11/08/19 0421 11/09/19 0202  WBC 8.0 5.4 5.5 6.0    Liver Function Tests: Recent Labs  Lab 11/06/19 1200 11/07/19 0049  AST 181* 113*  ALT 43 33  ALKPHOS 63 44  BILITOT 1.1 1.0  PROT 10.3* 8.0  ALBUMIN 4.7 3.5   No results for input(s): LIPASE, AMYLASE in the last 168 hours. No results for input(s): AMMONIA in the last 168 hours.  ABG    Component Value Date/Time   TCO2 25 08/02/2009 2156     Coagulation Profile: Recent Labs  Lab 11/07/19 1219  INR 1.1    Cardiac Enzymes: No  results for input(s): CKTOTAL, CKMB, CKMBINDEX, TROPONINI in the last 168 hours.  HbA1C: Hgb A1c MFr Bld  Date/Time Value Ref Range Status  05/23/2018 09:52 AM 6.9 (H) 4.8 - 5.6 % Final    Comment:    (NOTE) Pre diabetes:          5.7%-6.4% Diabetes:              >6.4% Glycemic control for   <7.0% adults with diabetes     CBG: Recent Labs  Lab 11/06/19 2104  GLUCAP 90    Past Medical History  He,  has a past medical history of Alcoholism (HCC).   Surgical History   No past surgical history on file.   Social History   reports that he has been smoking cigarettes. He has been smoking about 0.50 packs per day. He has never used smokeless tobacco. He reports current alcohol use.   Family History   His family history is not  on file.   Allergies Allergies  Allergen Reactions  . Pork-Derived Products Other (See Comments)    Pt states he doesn't eat pork     Home Medications  Prior to Admission medications   Medication Sig Start Date End Date Taking? Authorizing Provider  ibuprofen (ADVIL,MOTRIN) 400 MG tablet Take 1 tablet (400 mg total) by mouth 3 (three) times daily as needed for fever. Patient not taking: Reported on 05/24/2019 05/28/18   Jerald Kief, MD  metoprolol tartrate (LOPRESSOR) 25 MG tablet Take 1 tablet (25 mg total) by mouth 2 (two) times daily for 30 days. Patient not taking: Reported on 05/24/2019 05/28/18 06/27/18  Jerald Kief, MD  predniSONE (DELTASONE) 10 MG tablet Taper dose: 40mg  po x 2 days, then 20mg  po daily x 2 days, then 10mg  po daily x 2 days, then 5mg  po daily x 2 days, then stop. Zero refills Patient not taking: Reported on 05/24/2019 05/28/18   , MD     , M.D.

## 2019-11-09 NOTE — Progress Notes (Addendum)
Family Medicine Teaching Service Daily Progress Note Intern Pager: [phone]  Patient name: Jake Nguyen Medical record number: [id number] Date of birth: 1965-11-11 Age: 54 y.o. Gender: male  Primary Care Provider: Patient, No Pcp Per Consultants: Psychiatry  Code Status: Full  Pt Overview and Major Events to Date:  Admitted on 11/06/2019  Assessment and Plan: Jake Nguyen a 54 y.o.malepresenting with hallucinations and alcohol withdrawal. PMH is significant foralcoholism.  Alcohol Withdrawal w/ Hallucinations Appears to be worsening. Received 10 mg IV ativan in 4 hours. Continually intermittently agitated and wanting to leave. Security called x2 (1 once for support, 1 once to get patient back to room). Appreciate interim progress note for full details. Official IVC paperwork filed 11/08/19 and is in patient's shadow chart.  -scheduled Ativan 2 mg q6 -CIWA score w/ 0-4mg  IV ativan Q1H PRN -Haldol 1 mg IM once PRN -folate -placed on 1:1safetyobservation -seizure precautionsplaced  -Obtain bedside complete abdominal US  -continue to monitor for further withdrawal symptoms -counsel on excessive alcohol use once stable -Low threshold to call CCM with continued worsening symptoms  Right Mandibular Cyst Not seen on admission but patient admits it has been there for several weeks. It is not painful but has grown some. It is mobile and does not appear to cause the patient distress at this time. Will need to monitor for acute changes since this is a new finding by the team. Nurse voiced neck cyst as well. This was not seen on exam at the time and patient remains agitated. 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 Patient uses lotion at home. -Vaseline  Hyponatremia. Stable.  Na 133 again today, improved since admission at 127, likelydehydration status upon arrival.History of hyponatremia during previous  hospitalizations. -monitor mental status changes -Continue to monitor BMP  Hypokalemia. Resolved.   K 4.3 -continue to monitor BMP/CMP  Thrombocytopenia. Improving  Platelet count 116 today. Platelet count on admission 69. Likely reactivecausesand complication of alcohol abuse. -Continue to monitor CBC   Tobacco use Patient smokes half pack/day -Nicotine patch  FEN/GI: Regular diet  PPx: Lovenox  Disposition: Inpatient with close monitoring   Subjective:  Please see interim progress note.   Objective: Temp:  [97.8 F (36.6 C)-99.2 F (37.3 C)] 98.1 F (36.7 C) (07/31 0726) Pulse Rate:  [60-98] 98 (07/31 0726) Resp:  [17-20] 17 (07/31 0726) BP: (112-138)/(72-95) 126/93 (07/31 0726) SpO2:  [99 %-100 %] 99 % (07/31 0726)  Physical Exam:   General: Agitated, very unstable with ambulation. No acute distress. Sitter at bedside. HEENT: Right mandibular nodule consistent with what appears to be a mobile non painful cyst Cardiac: Tachycardic regular rhythm, normal heart sounds, no murmurs Respiratory: CTAB, normal effort Neuro: alert and oriented to person and place. Unstable ambulation.  Psych: Visible and vocal agitation   Laboratory: Recent Labs  Lab 11/07/19 0049 11/08/19 0421 11/09/19 0202  WBC 5.4 5.5 6.0  HGB 11.7* 11.9* 13.2  HCT 34.7* 35.6* 39.9  PLT 64* 92* 116*   Recent Labs  Lab 11/06/19 1200 11/06/19 1200 11/07/19 0049 11/08/19 0421 11/09/19 0202  NA 127*   < > 132* 133* 133*  K 5.2*   < > 3.3* 3.4* 4.3  CL 90*   < > 98 98 100  CO2 19*   < > 22 26 23   BUN 9   < > 12 8 5*  CREATININE 1.32*   < > 0.80 0.63 0.62  CALCIUM 9.4   < >  8.2* 8.5* 9.1  PROT 10.3*  --  8.0  --   --   BILITOT 1.1  --  1.0  --   --   ALKPHOS 63  --  44  --   --   ALT 43  --  33  --   --   AST 181*  --  113*  --   --   GLUCOSE 101*   < > 87 104* 112*   < > = values in this interval not displayed.   Imaging/Diagnostic Tests: No results found.  Lavonda Jumbo, DO 11/09/2019, 8:11 AM PGY-2, [facility] FPTS Intern pager: [phone], text pages welcome

## 2019-11-09 NOTE — Progress Notes (Signed)
Pt again attempting to leave.  Unsteady gait-high fall risk.  Agitated & demanding to leave.  Pt requiring 2-4 staff members to re-orient, re-direct & stabilize patient.   MD notified  Haldal given 1 mg IM

## 2019-11-09 NOTE — Progress Notes (Signed)
74- Paged to patient's room. IVC hard copy in patient's shadow chart. Security called. Mr. Inclan has made multiple attempts to leave the hospital. 2 mg IV ativan given by RN. Asked to sit on bed when I entered the room as he was standing by the door. He is visibly unstable on his feet and flopped back into the bed. He would like to go outside and smoke a cigarette and go home. He wants to see his family. When asked why does he want to go home he says to pay his bills. He would not like his fiance take the money and pay them for him. He continues to get agitated. I performed my physical exam (appreciate in progress note) without issue. Explained to him that he is here for the safety of himself. He says his fiance is coming to get him and take him home. I explained I would talk with the fiance when she got here and get her opinion on how he is doing. He agreed but continued to get out of bed and walk toward the door additional 2 mg IV ativan given.   [phone]-Paged by RN patient attempting to leave after a period of calm and attempting activities, security paged 2mg  IV ativan given. Arrive and patient and staff are in the hallway. Security intervened shortly after my arrival and helped patient back into the room. Mr. Mutchler was covering up his IV with his hand so that the nurse could not give him IV ativan. With the assistance of security and [employer] she was able to administer 4 mg ativan. In a separate private room I spoke with the fiance who was in the waiting room at the time of this event. She is concerned for his safety and his mental state especially since the seizure. She and his sister who joined Jean Rosenthal via phone (she lives in [place]) she has the same concerns as well. Explained EEG may not be beneficial in his acute state and explains the course of Delirium tremens. They voiced understanding. Also voiced understanding is the plan is to follow up on the abdominal ultrasound and continue to keep him safe and treat  him medically as he continues to withdrawal from alcohol. For now the fiance will not visit his room because she feels this agitates him more and makes him think she is coming to take him home. We will call with daily updates or major changes.   I really appreciate the patience and care of the nursing staff and security guards.  Rhiley Tarver Autry-Lott, DO 11/09/2019, 12:45 PM PGY-2, [facility]

## 2019-11-09 NOTE — Progress Notes (Signed)
Pt noted darting out of room at1410 and into fire exit at the end of the hall.  Patient once again assisted back to bed.   Security notified.   Pharmacy contacted regarding medication status.  Medication arrived via tube and administered.

## 2019-11-09 NOTE — Progress Notes (Signed)
Late lunch ordered per pt request.  Meal arrived and currently refusing meal

## 2019-11-09 NOTE — Progress Notes (Signed)
Patient a little more agitated this morning. Trying to leave the room. He says he is ready to leave and wants to smoke a cigarette. A pack of cigarettes and a lighter was found in the  patients room. It has been confiscated and placed in the patients chart.

## 2019-11-09 NOTE — Progress Notes (Signed)
Paged by nursing staff. Patient has 2 visitors without clearance at bedside. Was resting prior to their arrival and became agitated as well as physical with the nursing staff. AC and security called. Visitors asked to contact patient's contact person for further details. They were informed that they do not have clearance to visit. They exited on their own. Patient got back in the bed and laid down on his own. PCCM aware and will come see likely today. Will appreciate recommendations. Psych consult place and psych (spoke with Dr. Lenore Cordia) aware and will come see pt tomorrow.  Lavonda Jumbo, DO 11/09/2019, 2:17 PM PGY-2, [facility]

## 2019-11-09 NOTE — Progress Notes (Signed)
CALL PAGER [phone] for any questions or notifications regarding this patient  FMTS Attending Note: Denny Levy MD Appreciate CCm consult and recommendations. {atient has been very unstable today with several episodes requiring security to room and has broken several restraints.  I have examined him multiple times today and do not see anything that would indicate other cause for agitation than his alcohol withdrawal. Notably, he was experiencing DT symptoms on admission when his alcohol level was 40.

## 2019-11-09 NOTE — Progress Notes (Signed)
IVC filed 11/08/19 Copy of papers are in pt chart. MD notified

## 2019-11-09 NOTE — Progress Notes (Signed)
6 mg ativan given at 1733 per CCM MD.  Approx 1800, patient alert and no yelling/crusing and attempting to remove restraints.

## 2019-11-09 NOTE — Progress Notes (Signed)
No urine output recorded since 7/30.   Pt states he did "use that bottle" but no urination noted.  Abd & pelvis non-tender/non-distended. Bladder scan x 3 noted bladder with no urine.

## 2019-11-09 NOTE — Progress Notes (Signed)
Pt belligerent and combative and is moving too much to insert an IV at this time. Daryl RN aware.

## 2019-11-09 NOTE — Progress Notes (Addendum)
Pt noted agitated, trying to leave, and swinging fist.   Unsteady gait.   Charge notified  Security called and currently at bedside PRN ativan 2 mg given  MD paged

## 2019-11-09 NOTE — Progress Notes (Signed)
Pt refusing to sit in bed and wants to leave. Pt staggering in hallway, resisting help from staff. Order received for soft waist restraint. Will apply with help from security.  Versie Starks, RN

## 2019-11-09 NOTE — Progress Notes (Signed)
Patient given juice and happy meal/ box lunch.  Sitter assisted in feeding.

## 2019-11-10 DIAGNOSIS — F1014 Alcohol abuse with alcohol-induced mood disorder: Secondary | ICD-10-CM

## 2019-11-10 DIAGNOSIS — E871 Hypo-osmolality and hyponatremia: Secondary | ICD-10-CM

## 2019-11-10 HISTORY — DX: Alcohol abuse with alcohol-induced mood disorder: F10.14

## 2019-11-10 LAB — BASIC METABOLIC PANEL
Anion gap: 11 (ref 5–15)
BUN: 6 mg/dL (ref 6–20)
CO2: 26 mmol/L (ref 22–32)
Calcium: 9.5 mg/dL (ref 8.9–10.3)
Chloride: 97 mmol/L — ABNORMAL LOW (ref 98–111)
Creatinine, Ser: 0.69 mg/dL (ref 0.61–1.24)
GFR calc Af Amer: >60 mL/min (ref >60)
GFR calc non Af Amer: >60 mL/min (ref >60)
Glucose, Bld: 104 mg/dL — ABNORMAL HIGH (ref 70–99)
Potassium: 3.7 mmol/L (ref 3.5–5.1)
Sodium: 134 mmol/L — ABNORMAL LOW (ref 135–145)

## 2019-11-10 LAB — CBC WITH DIFFERENTIAL/PLATELET
Abs Immature Granulocytes: 0.02 10*3/uL (ref 0.00–0.07)
Basophils Absolute: 0 10*3/uL (ref 0.0–0.1)
Basophils Relative: 1 %
Eosinophils Absolute: 0 10*3/uL (ref 0.0–0.5)
Eosinophils Relative: 1 %
HCT: 36.5 % — ABNORMAL LOW (ref 39.0–52.0)
Hemoglobin: 12.1 g/dL — ABNORMAL LOW (ref 13.0–17.0)
Immature Granulocytes: 0 %
Lymphocytes Relative: 24 %
Lymphs Abs: 1.6 10*3/uL (ref 0.7–4.0)
MCH: 29.5 pg (ref 26.0–34.0)
MCHC: 33.2 g/dL (ref 30.0–36.0)
MCV: 89 fL (ref 80.0–100.0)
Monocytes Absolute: 1.3 10*3/uL — ABNORMAL HIGH (ref 0.1–1.0)
Monocytes Relative: 20 %
Neutro Abs: 3.7 10*3/uL (ref 1.7–7.7)
Neutrophils Relative %: 54 %
Platelets: 157 10*3/uL (ref 150–400)
RBC: 4.1 MIL/uL — ABNORMAL LOW (ref 4.22–5.81)
RDW: 14.1 % (ref 11.5–15.5)
WBC: 6.8 10*3/uL (ref 4.0–10.5)
nRBC: 0 % (ref 0.0–0.2)

## 2019-11-10 MED ORDER — GABAPENTIN 100 MG PO CAPS
200.0000 mg | ORAL_CAPSULE | Freq: Two times a day (BID) | ORAL | Status: DC
  Administered 2019-11-10 – 2019-11-12 (×4): 200 mg via ORAL
  Filled 2019-11-10 (×5): qty 2

## 2019-11-10 MED ORDER — LORAZEPAM 2 MG/ML IJ SOLN
6.0000 mg | INTRAMUSCULAR | Status: AC | PRN
  Administered 2019-11-10: 2 mg via INTRAMUSCULAR

## 2019-11-10 NOTE — Progress Notes (Signed)
CALL PAGER [phone] for any questions or notifications regarding this patient  FMTS Attending Note: Jake Levy MD Jake Nguyen is much calmer today. Will continue current regimen and begin to wean his lorazepam tomorrow if e continues to do well. Appreciate CCM assistance in his care.

## 2019-11-10 NOTE — Progress Notes (Signed)
NAME:  Jake Nguyen, MRN:  [id number], DOB:  30-Dec-1965, LOS: 4 ADMISSION DATE:  11/06/2019, CONSULTATION DATE:  11/06/2019 REFERRING MD:  Deirdre Priest, CHIEF COMPLAINT:  Alcohol withdrawal   Brief History   Patient currently having hallucinations and unable to provide history. Patient was brought to the emergency department 7/26 for evaluation after a witnessed seizure. His girlfriend stated he had not drink anything for approximately 18 hours leading up to the seizure.  He was noted to be very agitated in the ED and left AMA.  Was initially seen by [facility] 7/28; recommended hi-dose Ativan CIWA, phenobarb for breakthrough, but recognized patient may need Precedex.  PCCM called today for increased agitation not responsive to Ativan 4 mg IV or phenobarb. The RN reports that the patient can be relatively calm one minute, then markedly agitated the next.  Past Medical History  Alcohol use disorder Tobacco use disorder   Interim history/subjective:   Patient's agitation better managed over last 24h - ativan working   Objective   Blood pressure 120/83, pulse 83, temperature 98.1 F (36.7 C), temperature source Oral, resp. rate 20, weight 69 kg, SpO2 98 %.        Intake/Output Summary (Last 24 hours) at 11/10/2019 1441 Last data filed at 11/10/2019 1258 Gross per 24 hour  Intake 1320 ml  Output 1000 ml  Net 320 ml   Filed Weights   11/06/19 1954  Weight: 69 kg    Examination: Appears calm, sleeping. Has not been belligerent, has participated in his care today - allowed PIV to be placed. No more tremor.   Assessment & Plan:   EtOH withdrawal, better control now after initial bolus and increase in ativan to get symptoms under control. Anticipate that we will be able to slowly wean.  Does not appear to need ICU transfer.    Labs   CBC: Recent Labs  Lab 11/06/19 1200 11/07/19 0049 11/08/19 0421 11/09/19 0202 11/10/19 0244  WBC 8.0 5.4 5.5 6.0 6.8  NEUTROABS  --   --  2.6 2.9 3.7   HGB 13.9 11.7* 11.9* 13.2 12.1*  HCT 40.0 34.7* 35.6* 39.9 36.5*  MCV 85.7 87.2 89.7 91.1 89.0  PLT 69* 64* 92* 116* 157    Basic Metabolic Panel: Recent Labs  Lab 11/06/19 1200 11/07/19 0049 11/08/19 0421 11/09/19 0202 11/10/19 0244  NA 127* 132* 133* 133* 134*  K 5.2* 3.3* 3.4* 4.3 3.7  CL 90* 98 98 100 97*  CO2 19* 22 26 23 26   GLUCOSE 101* 87 104* 112* 104*  BUN 9 12 8  5* 6  CREATININE 1.32* 0.80 0.63 0.62 0.69  CALCIUM 9.4 8.2* 8.5* 9.1 9.5  MG  --   --  2.1  --   --    GFR: Estimated Creatinine Clearance: 105.4 mL/min (by C-G formula based on SCr of 0.69 mg/dL). Recent Labs  Lab 11/07/19 0049 11/08/19 0421 11/09/19 0202 11/10/19 0244  WBC 5.4 5.5 6.0 6.8    Liver Function Tests: Recent Labs  Lab 11/06/19 1200 11/07/19 0049  AST 181* 113*  ALT 43 33  ALKPHOS 63 44  BILITOT 1.1 1.0  PROT 10.3* 8.0  ALBUMIN 4.7 3.5   No results for input(s): LIPASE, AMYLASE in the last 168 hours. No results for input(s): AMMONIA in the last 168 hours.  ABG    Component Value Date/Time   TCO2 25 08/02/2009 2156     Coagulation Profile: Recent Labs  Lab 11/07/19 1219  INR 1.1  Cardiac Enzymes: No results for input(s): CKTOTAL, CKMB, CKMBINDEX, TROPONINI in the last 168 hours.  HbA1C: Hgb A1c MFr Bld  Date/Time Value Ref Range Status  05/23/2018 09:52 AM 6.9 (H) 4.8 - 5.6 % Final    Comment:    (NOTE) Pre diabetes:          5.7%-6.4% Diabetes:              >6.4% Glycemic control for   <7.0% adults with diabetes     CBG: Recent Labs  Lab 11/06/19 2104  GLUCAP 90    Past Medical History  He,  has a past medical history of Alcoholism (HCC).   Surgical History   No past surgical history on file.   Social History   reports that he has been smoking cigarettes. He has been smoking about 0.50 packs per day. He has never used smokeless tobacco. He reports current alcohol use.   Family History   His family history is not on file.    Allergies Allergies  Allergen Reactions   Pork-Derived Products Other (See Comments)    Pt states he doesn't eat pork     Home Medications  Prior to Admission medications   Medication Sig Start Date End Date Taking? Authorizing Provider  ibuprofen (ADVIL,MOTRIN) 400 MG tablet Take 1 tablet (400 mg total) by mouth 3 (three) times daily as needed for fever. Patient not taking: Reported on 05/24/2019 05/28/18   Jerald Kief, MD  metoprolol tartrate (LOPRESSOR) 25 MG tablet Take 1 tablet (25 mg total) by mouth 2 (two) times daily for 30 days. Patient not taking: Reported on 05/24/2019 05/28/18 06/27/18  Jerald Kief, MD  predniSONE (DELTASONE) 10 MG tablet Taper dose: 40mg  po x 2 days, then 20mg  po daily x 2 days, then 10mg  po daily x 2 days, then 5mg  po daily x 2 days, then stop. Zero refills Patient not taking: Reported on 05/24/2019 05/28/18   , MD    Call if we can assist in any way.    , MD, PhD 11/10/2019, 2:45 PM [facility] [phone] or if no answer [phone]

## 2019-11-10 NOTE — Consult Note (Signed)
[facility] Face-to-Face Psychiatry Consult   Reason for Consult:  ''agitation, failed medicinal restraints.'' Referring Physician:  Denny Levy, MD Patient Identification: MABRY TIFT MRN:  [id number] Principal Diagnosis: Alcohol abuse with alcohol-induced mood disorder (HCC) Diagnosis:  Principal Problem:   Alcohol abuse with alcohol-induced mood disorder (HCC) Active Problems:   Alcohol withdrawal syndrome, with delirium (HCC)   Alcoholism /alcohol abuse (HCC)   Total Time spent with patient: 1 hour  Subjective:   BOYCE KELTNER is a 54 y.o. male patient admitted with hallucinations and Alcohol withdrawal.  HPI:  Patient with Cannabis use disorder, Alcohol use disorder -severe who was placed under IVC prior to admission by her fiancee Izetta Dakin)  due to ongoing alcohol abuse with hallucinations and withdrawal symptoms. This consult was initiated due to increased agitation which did not respond to medication management. Today, patient is alert, oriented, cooperative, denies psychosis, delusions self harming thoughts or thoughts of harming others. He reports daily alcohol use, about 6 pack of beer  for the past 20 years. He states that he was referred to alcohol rehabilitations several times in the past but did not take it serious. Now, he is willing to quit alcohol and wants every help he can get, says he does not want to ruin his relationship with his fiancee.    Past Psychiatric History: as above  Risk to Self:  denies Risk to Others:   denies Prior Inpatient Therapy:   [facility] in the past Prior Outpatient Therapy:   none reported by the patient  Past Medical History:  Past Medical History:  Diagnosis Date  . Alcoholism (HCC)    No past surgical history on file. Family History: No family history on file. Family Psychiatric  History:  Social History:  Social History   Substance and Sexual Activity  Alcohol Use Yes     Social History   Substance and Sexual Activity   Drug Use Not on file    Social History   Socioeconomic History  . Marital status: Single    Spouse name: Not on file  . Number of children: Not on file  . Years of education: Not on file  . Highest education level: Not on file  Occupational History  . Not on file  Tobacco Use  . Smoking status: Current Every Day Smoker    Packs/day: 0.50    Types: Cigarettes  . Smokeless tobacco: Never Used  Substance and Sexual Activity  . Alcohol use: Yes  . Drug use: Not on file  . Sexual activity: Not Currently  Other Topics Concern  . Not on file  Social History Narrative  . Not on file   Social Determinants of Health   Financial Resource Strain:   . Difficulty of Paying Living Expenses:   Food Insecurity:   . Worried About Programme researcher, broadcasting/film/video in the Last Year:   . Barista in the Last Year:   Transportation Needs:   . Freight forwarder (Medical):   Marland Kitchen Lack of Transportation (Non-Medical):   Physical Activity:   . Days of Exercise per Week:   . Minutes of Exercise per Session:   Stress:   . Feeling of Stress :   Social Connections:   . Frequency of Communication with Friends and Family:   . Frequency of Social Gatherings with Friends and Family:   . Attends Religious Services:   . Active Member of Clubs or Organizations:   . Attends Banker Meetings:   .  Marital Status:    Additional Social History:    Allergies:   Allergies  Allergen Reactions  . Pork-Derived Products Other (See Comments)    Pt states he doesn't eat pork    Labs:  Results for orders placed or performed during the hospital encounter of 11/06/19 (from the past 48 hour(s))  Rapid urine drug screen (hospital performed)     Status: Abnormal   Collection Time: 11/08/19  1:21 PM  Result Value Ref Range   Opiates NONE DETECTED NONE DETECTED   Cocaine NONE DETECTED NONE DETECTED   Benzodiazepines POSITIVE (A) NONE DETECTED   Amphetamines NONE DETECTED NONE DETECTED    Tetrahydrocannabinol POSITIVE (A) NONE DETECTED   Barbiturates POSITIVE (A) NONE DETECTED    Comment: (NOTE) DRUG SCREEN FOR MEDICAL PURPOSES ONLY.  IF CONFIRMATION IS NEEDED FOR ANY PURPOSE, NOTIFY LAB WITHIN 5 DAYS.  LOWEST DETECTABLE LIMITS FOR URINE DRUG SCREEN Drug Class                     Cutoff (ng/mL) Amphetamine and metabolites    1000 Barbiturate and metabolites    200 Benzodiazepine                 200 Tricyclics and metabolites     300 Opiates and metabolites        300 Cocaine and metabolites        300 THC                            50 Performed at [facility], [address], [place], [place] [address]   CBC with Differential/Platelet     Status: Abnormal   Collection Time: 11/09/19  2:02 AM  Result Value Ref Range   WBC 6.0 4.0 - 10.5 K/uL   RBC 4.38 4.22 - 5.81 MIL/uL   Hemoglobin 13.2 13.0 - 17.0 g/dL   HCT 20.2 39 - 52 %   MCV 91.1 80.0 - 100.0 fL   MCH 30.1 26.0 - 34.0 pg   MCHC 33.1 30.0 - 36.0 g/dL   RDW 54.2 70.6 - 23.7 %   Platelets 116 (L) 150 - 400 K/uL    Comment: REPEATED TO VERIFY SPECIMEN CHECKED FOR CLOTS Immature Platelet Fraction may be clinically indicated, consider ordering this additional test [id number] CONSISTENT WITH PREVIOUS RESULT    nRBC 0.0 0.0 - 0.2 %   Neutrophils Relative % 48 %   Neutro Abs 2.9 1.7 - 7.7 K/uL   Lymphocytes Relative 31 %   Lymphs Abs 1.8 0.7 - 4.0 K/uL   Monocytes Relative 19 %   Monocytes Absolute 1.1 (H) 0 - 1 K/uL   Eosinophils Relative 1 %   Eosinophils Absolute 0.0 0 - 0 K/uL   Basophils Relative 1 %   Basophils Absolute 0.1 0 - 0 K/uL   Immature Granulocytes 0 %   Abs Immature Granulocytes 0.01 0.00 - 0.07 K/uL    Comment: Performed at [facility], [address] [place], [place] [address]  Basic metabolic panel     Status: Abnormal   Collection Time: 11/09/19  2:02 AM  Result Value Ref Range   Sodium 133 (L) 135 - 145 mmol/L   Potassium 4.3 3.5 - 5.1 mmol/L   Chloride 100 98  - 111 mmol/L   CO2 23 22 - 32 mmol/L   Glucose, Bld 112 (H) 70 - 99 mg/dL    Comment:  Glucose reference range applies only to samples taken after fasting for at least 8 hours.   BUN 5 (L) 6 - 20 mg/dL   Creatinine, Ser 1.61 0.61 - 1.24 mg/dL   Calcium 9.1 8.9 - 09.6 mg/dL   GFR calc non Af Amer >60 >60 mL/min   GFR calc Af Amer >60 >60 mL/min   Anion gap 10 5 - 15    Comment: Performed at [facility], [address] [place], [place] [address]  CBC with Differential/Platelet     Status: Abnormal   Collection Time: 11/10/19  2:44 AM  Result Value Ref Range   WBC 6.8 4.0 - 10.5 K/uL   RBC 4.10 (L) 4.22 - 5.81 MIL/uL   Hemoglobin 12.1 (L) 13.0 - 17.0 g/dL   HCT 98.1 (L) 39 - 52 %   MCV 89.0 80.0 - 100.0 fL   MCH 29.5 26.0 - 34.0 pg   MCHC 33.2 30.0 - 36.0 g/dL   RDW 19.1 47.8 - 29.5 %   Platelets 157 150 - 400 K/uL   nRBC 0.0 0.0 - 0.2 %   Neutrophils Relative % 54 %   Neutro Abs 3.7 1.7 - 7.7 K/uL   Lymphocytes Relative 24 %   Lymphs Abs 1.6 0.7 - 4.0 K/uL   Monocytes Relative 20 %   Monocytes Absolute 1.3 (H) 0 - 1 K/uL   Eosinophils Relative 1 %   Eosinophils Absolute 0.0 0 - 0 K/uL   Basophils Relative 1 %   Basophils Absolute 0.0 0 - 0 K/uL   Immature Granulocytes 0 %   Abs Immature Granulocytes 0.02 0.00 - 0.07 K/uL    Comment: Performed at [facility], [address] [place] [address], [place], [place] [address]  Basic metabolic panel     Status: Abnormal   Collection Time: 11/10/19  2:44 AM  Result Value Ref Range   Sodium 134 (L) 135 - 145 mmol/L   Potassium 3.7 3.5 - 5.1 mmol/L   Chloride 97 (L) 98 - 111 mmol/L   CO2 26 22 - 32 mmol/L   Glucose, Bld 104 (H) 70 - 99 mg/dL    Comment: Glucose reference range applies only to samples taken after fasting for at least 8 hours.   BUN 6 6 - 20 mg/dL   Creatinine, Ser 8.65 0.61 - 1.24 mg/dL   Calcium 9.5 8.9 - 78.4 mg/dL   GFR calc non Af Amer >60 >60 mL/min   GFR calc Af Amer >60 >60 mL/min   Anion gap 11 5 - 15     Comment: Performed at [facility], [address], [place], [place] [address]    Current Facility-Administered Medications  Medication Dose Route Frequency Provider Last Rate Last Admin  . enoxaparin (LOVENOX) injection 40 mg  40 mg Subcutaneous Q24H Autry-Lott, Simone, DO   40 mg at 11/08/19 1233  . folic acid (FOLVITE) tablet 1 mg  1 mg Oral Daily Leander Rams, RPH   1 mg at 11/10/19 5284   Or  . folic acid injection 1 mg  1 mg Intravenous Daily Leander Rams, RPH      . gabapentin (NEURONTIN) capsule 200 mg  200 mg Oral BID Carles Florea, MD      . LORazepam (ATIVAN) injection 0-4 mg  0-4 mg Intravenous Q1H PRN Autry-Lott, Simone, DO   4 mg at 11/09/19 1733   Or  . LORazepam (ATIVAN) tablet 0-4 mg  0-4 mg Oral Q1H PRN  Autry-Lott, Simone, DO      . LORazepam (ATIVAN) injection 2 mg  2 mg Intravenous Q6H Beard, Samantha N, DO   2 mg at 11/09/19 2001  . multivitamin with minerals tablet 1 tablet  1 tablet Oral Daily Autry-Lott, Simone, DO   1 tablet at 11/10/19 0934  . nicotine (NICODERM CQ - dosed in mg/24 hours) patch 14 mg  14 mg Transdermal Daily Ganta, Anupa, DO   14 mg at 11/09/19 1015  . thiamine tablet 100 mg  100 mg Oral Daily Autry-Lott, Simone, DO   100 mg at 11/10/19 3474   Or  . thiamine (B-1) injection 100 mg  100 mg Intravenous Daily Autry-Lott, Simone, DO   100 mg at 11/08/19 2595  . white petrolatum (VASELINE) gel   Topical PRN Reece Leader, DO        Musculoskeletal: Strength & Muscle Tone: not tested Gait & Station: unsteady Patient leans: N/A  Psychiatric Specialty Exam: Physical Exam Psychiatric:        Attention and Perception: Attention normal.        Mood and Affect: Mood normal.        Speech: Speech normal.        Behavior: Behavior is cooperative.        Thought Content: Thought content normal.        Cognition and Memory: Cognition and memory normal.        Judgment: Judgment is impulsive.     Review of Systems  Constitutional: Negative.    HENT: Negative.   Eyes: Negative.   Respiratory: Negative.   Cardiovascular: Negative.   Gastrointestinal: Negative.   Psychiatric/Behavioral: The patient is nervous/anxious.     Blood pressure 117/80, pulse 92, temperature 98.2 F (36.8 C), temperature source Oral, resp. rate 19, weight 69 kg, SpO2 100 %.Body mass index is 20.63 kg/m.  General Appearance: Casual  Eye Contact:  Good  Speech:  Clear and Coherent  Volume:  Normal  Mood:  Euthymic  Affect:  anxious  Thought Process:  Coherent and Linear  Orientation:  Full (Time, Place, and Person)  Thought Content:  Logical  Suicidal Thoughts:  No  Homicidal Thoughts:  No  Memory:  Immediate;   Fair Recent;   Fair Remote;   Fair  Judgement:  Fair  Insight:  Shallow  Psychomotor Activity:  Psychomotor Retardation  Concentration:  Concentration: Fair and Attention Span: Fair  Recall:  Good  Fund of Knowledge:  Good  Language:  Good  Akathisia:  No  Handed:  Right  AIMS (if indicated):     Assets:  Communication Skills Desire for Improvement Social Support  ADL's:  Intact  Cognition:  WNL  Sleep:        Treatment Plan Summary: 54 year old man with long history of alcohol use disorder dating back to 20 years ago. He was admitted due to alcohol withdrawal, hallucination and found to be agitated. Today, he is calm, cooperative but still having unsteady gait and mild tremulous. He denies psychosis, delusions and self harming thoughts.   Recommendations: -Continue CIWA and Lorazepam alcohol detox protocol  -Consider adding Gabapentin 200 mg bid for alcohol withdrawal and agitation. -Consider social worker consult to facilitate referring patient to outpatient alcohol rehab facility after he is medically stable  Disposition: No evidence of imminent risk to self or others at present.   Patient does not meet criteria for psychiatric inpatient admission. Supportive therapy provided about ongoing stressors. Psychiatric service  siging out. Re-consult  as needed  Thedore MinsMojeed Tarin Johndrow, MD 11/10/2019 11:43 AM

## 2019-11-10 NOTE — Progress Notes (Signed)
Family Medicine Teaching Service Daily Progress Note Intern Pager: [phone]  Patient name: Jake Nguyen Medical record number: [id number] Date of birth: Jun 08, 1965 Age: 54 y.o. Gender: male  Primary Care Provider: Patient, No Pcp Per Consultants: Psychiatry  Code Status: Full  Pt Overview and Major Events to Date:  Admitted on 11/06/2019  Assessment and Plan: Jake Nguyen a 54 y.o.malepresenting with hallucinations and alcohol withdrawal. PMH is significant foralcoholism.  Alcohol Withdrawal w/ Hallucinations, improved Patient significantly less agitated today. Received total of 6 mg IV Ativan today. Yesterday received total of 22 mg IV Ativan. Official IVC paperwork filed 11/08/19 and is in patient's shadow chart.  -PRN Ativan 6mg  x1 as needed for agitation -CIWA score w/ 0-4mg  IV ativan Q1H PRN -Haldol 1 mg IM once PRN -folate -placed on 1:1safetyobservation -seizure precautionsplaced  -continue to monitor for further withdrawal symptoms -counsel on excessive alcohol use once stable -Low threshold to call CCM should patient become unstable  Right Mandibular Cyst, stable, unchanged Not seen on admission but patient admits it has been there for several weeks. It is not painful but has grown some. It is mobile and does not appear to cause the patient distress at this time. Will need to monitor for acute changes since this is a new finding by the team. -Consider outpatient I&D   Xeroderma, chronic, stable Severe, scaly xerosis noted on LE bilaterally. Patient uses lotion at home. -Vaseline  Hyponatremia, improving, stable  Na 134 today 8/1, has improved since admission. History of hyponatremia during previous hospitalizations. -monitor mental status changes -Continue to monitor BMP  Hypokalemia, resolved.   K 4.3 -continue to monitor BMP  Thrombocytopenia, improving  Platelet count 116>157 today 8/1. Platelet count on admission 69. Likely  reactivecausesand complication of alcohol abuse. -Continue to monitor CBC   Tobacco use Patient smokes half pack/day -Nicotine patch  FEN/GI: Regular diet  PPx: Lovenox  Disposition: Inpatient with close monitoring   Subjective:  Patient seen sitting upright on the edge of his bed with safety sitter in the room.  Reports that he feels he is doing "much better" today than yesterday.  Is much more clear headed today, also very friendly.  Does not have any concerns at this moment.    Objective: Temp:  [97.7 F (36.5 C)-99 F (37.2 C)] 98.8 F (37.1 C) (08/01 0416) Pulse Rate:  [85-115] 85 (08/01 0416) Resp:  [17-23] 23 (07/31 1938) BP: (112-138)/(82-95) 117/84 (08/01 0416) SpO2:  [99 %-100 %] 100 % (08/01 0100)  Physical Exam:   General: Pleasant today, no apparent distress, sitter at bedside HEENT: Right mandibular nodule consistent with what appears to be a mobile non painful cyst, unchanged from previous exam Cardiac: RRR, S1-S2 present, no murmurs appreciated Respiratory: CTAB, speaking in full sentences Neuro: alert and oriented to person and place.  Unsteady gait appreciated with brief ambulation in the room Psych: A&O x3  Laboratory: Recent Labs  Lab 11/08/19 0421 11/09/19 0202 11/10/19 0244  WBC 5.5 6.0 6.8  HGB 11.9* 13.2 12.1*  HCT 35.6* 39.9 36.5*  PLT 92* 116* 157   Recent Labs  Lab 11/06/19 1200 11/06/19 1200 11/07/19 0049 11/07/19 0049 11/08/19 0421 11/09/19 0202 11/10/19 0244  NA 127*   < > 132*   < > 133* 133* 134*  K 5.2*   < > 3.3*   < > 3.4* 4.3 3.7  CL 90*   < > 98   < > 98 100 97*  CO2 19*   < >  22   < > 26 23 26   BUN 9   < > 12   < > 8 5* 6  CREATININE 1.32*   < > 0.80   < > 0.63 0.62 0.69  CALCIUM 9.4   < > 8.2*   < > 8.5* 9.1 9.5  PROT 10.3*  --  8.0  --   --   --   --   BILITOT 1.1  --  1.0  --   --   --   --   ALKPHOS 63  --  44  --   --   --   --   ALT 43  --  33  --   --   --   --   AST 181*  --  113*  --   --   --   --    GLUCOSE 101*   < > 87   < > 104* 112* 104*   < > = values in this interval not displayed.   Imaging/Diagnostic Tests: Abdomen Complete  Result Date: 11/09/2019 CLINICAL DATA:  Alcoholism EXAM: ABDOMEN ULTRASOUND COMPLETE COMPARISON:  None FINDINGS: Gallbladder: No gallstones or wall thickening visualized. No sonographic Murphy sign noted by sonographer. Common bile duct: Diameter: 2.1 Liver: Coarse hepatic echotexture, generally increased with respect echogenicity. Limited assessment without focal lesion. Portal vein is patent on color Doppler imaging with normal direction of blood flow towards the liver. IVC: No abnormality visualized. Pancreas: Obscured by bowel gas. Spleen: Mildly heterogeneous, limited assessment.  Unenlarged. Right Kidney: Length: 11.6 cm. Mild lobulation of renal contour. Decreased corticomedullary differentiation. No hydronephrosis. Left Kidney: Length: 11 cm. No hydronephrosis. Mild lobulation of renal contour. Limited assessment due to adjacent bowel gas. Abdominal aorta: No aneurysm visualized. Other findings: None. IMPRESSION: 1. Coarse hepatic echotexture and increased echogenicity compatible with steatosis and question of early liver disease. 2. No acute biliary findings. 3. Mildly lobulated renal contours may represent scarring or congenital lobulation. No hydronephrosis. Limited assessment of kidneys and mid abdominal structures due to adjacent bowel gas. Electronically Signed   By: 11/11/2019 M.D.   On: 11/09/2019 13:28    11/11/2019, DO 11/10/2019, 6:51 AM PGY-3, [place] [facility] FPTS Intern pager: [phone], text pages welcome

## 2019-11-11 DIAGNOSIS — R270 Ataxia, unspecified: Secondary | ICD-10-CM

## 2019-11-11 LAB — CBC WITH DIFFERENTIAL/PLATELET
Abs Immature Granulocytes: 0.01 10*3/uL (ref 0.00–0.07)
Basophils Absolute: 0.1 10*3/uL (ref 0.0–0.1)
Basophils Relative: 1 %
Eosinophils Absolute: 0.1 10*3/uL (ref 0.0–0.5)
Eosinophils Relative: 2 %
HCT: 37.9 % — ABNORMAL LOW (ref 39.0–52.0)
Hemoglobin: 12.8 g/dL — ABNORMAL LOW (ref 13.0–17.0)
Immature Granulocytes: 0 %
Lymphocytes Relative: 41 %
Lymphs Abs: 2.5 10*3/uL (ref 0.7–4.0)
MCH: 30.3 pg (ref 26.0–34.0)
MCHC: 33.8 g/dL (ref 30.0–36.0)
MCV: 89.8 fL (ref 80.0–100.0)
Monocytes Absolute: 1 10*3/uL (ref 0.1–1.0)
Monocytes Relative: 16 %
Neutro Abs: 2.4 10*3/uL (ref 1.7–7.7)
Neutrophils Relative %: 40 %
Platelets: 224 10*3/uL (ref 150–400)
RBC: 4.22 MIL/uL (ref 4.22–5.81)
RDW: 13.9 % (ref 11.5–15.5)
WBC: 6 10*3/uL (ref 4.0–10.5)
nRBC: 0 % (ref 0.0–0.2)

## 2019-11-11 LAB — BASIC METABOLIC PANEL
Anion gap: 9 (ref 5–15)
BUN: 8 mg/dL (ref 6–20)
CO2: 28 mmol/L (ref 22–32)
Calcium: 9.1 mg/dL (ref 8.9–10.3)
Chloride: 98 mmol/L (ref 98–111)
Creatinine, Ser: 0.75 mg/dL (ref 0.61–1.24)
GFR calc Af Amer: >60 mL/min (ref >60)
GFR calc non Af Amer: >60 mL/min (ref >60)
Glucose, Bld: 107 mg/dL — ABNORMAL HIGH (ref 70–99)
Potassium: 3.7 mmol/L (ref 3.5–5.1)
Sodium: 135 mmol/L (ref 135–145)

## 2019-11-11 NOTE — Progress Notes (Signed)
Family Medicine Teaching Service Daily Progress Note Intern Pager: [phone]  Patient name: Jake Nguyen Medical record number: [id number] Date of birth: Aug 13, 1965 Age: 54 y.o. Gender: male  Primary Care Provider: Patient, No Pcp Per Consultants: Psychiatry Code Status: Full   Pt Overview and Major Events to Date:  Admitted on 11/06/2019  Assessment and Plan: Jake Nguyen a 54 y.o.malepresenting with hallucinations and alcohol withdrawal. PMH is significant foralcoholism.   Alcohol Withdrawal w/ Hallucinations, improved Pt reports feeling very well today. Received 2 mg IV Ativan today. Yesterday received 6mg  IV Ativan. Official IVC paperwork filed 11/08/19 and is in patient's shadow chart.  -CIWA score 4 (tremor and tactile disturbance) w/ 0-4mg  IV ativan Q1H PRN.  Received one dose 2 mg today  -PRN Ativan 6mg  x1 as needed for agitation. -Haldol 1 mg IM once PRN -folate -placed on 1:1safetyobservation. Consider removing sitter as pt has been improving -seizure precautionsplaced  -continue to monitor for further withdrawal symptoms -counsel on excessive alcohol use once stable -Low threshold to call CCM should patient become unstable - per psych consult: -"Continue CIWA and Lorazepam alcohol detox protocol  Consider adding Gabapentin 200 mg bid for alcohol withdrawal and agitation. Consider social worker consult to facilitate referring patient to outpatient alcohol rehab facility after he is medically stable" - received 1 dose gabapentin 200mg  this am  Right Mandibular Cyst, stable, unchanged Not seen on admission but patient admits it has been there for several weeks. It is not painful but has grown some. It is mobile and does not appear to cause the patient distress at this time. Will need to monitor for acute changes since this is a new finding by the team. -Consider outpatient I&D   Xeroderma, chronic, stable Severe, scaly xerosis noted on LE bilaterally.  Patient uses lotion at home. -Vaseline  Hyponatremia, improved  Na 135 today 8/2, has improved since admission. History of hyponatremia during previous hospitalizations. -monitor mental status changes -Continue to monitor BMP  Thrombocytopenia, improving  Platelet count [phone]. Platelet count on admission 69. Likely reactivecausesand complication of alcohol abuse. -Continue to monitor CBC   Tobacco use Patient smokes half pack/day -Nicotine patch  FEN/GI: Regular diet  PPx: Lovenox  Disposition: Inpatient with close monitoring   Subjective:   Today patient sitting on the bed and just finished eating a big breakfast. He endorses feeling very well, and was pleasant on conversation. He denies any complaints at this time.   Objective: Temp:  [98.1 F (36.7 C)-99.1 F (37.3 C)] 98.5 F (36.9 C) (08/02 0457) Pulse Rate:  [83-110] 91 (08/02 0457) Resp:  [18-20] 18 (08/02 0457) BP: (95-145)/(62-85) 103/85 (08/02 0457) SpO2:  [96 %-100 %] 96 % (08/02 0457) Physical Exam: General: Pleasant, sitting in bed eating breakfast  Cardiovascular: RRR. No murmurs, rubs, or gallops Respiratory: CTA. No wheezes, rales, rhonchi  Abdomen: Soft, non-tender, non distended  Psych: A&O x3  Laboratory: Recent Labs  Lab 11/09/19 0202 11/10/19 0244 11/11/19 0225  WBC 6.0 6.8 6.0  HGB 13.2 12.1* 12.8*  HCT 39.9 36.5* 37.9*  PLT 116* 157 224   Recent Labs  Lab 11/06/19 1200 11/06/19 1200 11/07/19 0049 11/08/19 0421 11/09/19 0202 11/10/19 0244 11/11/19 0225  NA 127*   < > 132*   < > 133* 134* 135  K 5.2*   < > 3.3*   < > 4.3 3.7 3.7  CL 90*   < > 98   < > 100 97* 98  CO2 19*   < >  22   < > 23 26 28   BUN 9   < > 12   < > 5* 6 8  CREATININE 1.32*   < > 0.80   < > 0.62 0.69 0.75  CALCIUM 9.4   < > 8.2*   < > 9.1 9.5 9.1  PROT 10.3*  --  8.0  --   --   --   --   BILITOT 1.1  --  1.0  --   --   --   --   ALKPHOS 63  --  44  --   --   --   --   ALT 43  --  33  --   --    --   --   AST 181*  --  113*  --   --   --   --   GLUCOSE 101*   < > 87   < > 112* 104* 107*   < > = values in this interval not displayed.     Imaging/Diagnostic Tests: Abdomen Complete Result Date: 11/09/2019 CLINICAL DATA:  Alcoholism   IMPRESSION: 1. Coarse hepatic echotexture and increased echogenicity compatible with steatosis and question of early liver disease. 2. No acute biliary findings. 3. Mildly lobulated renal contours may represent scarring or congenital lobulation. No hydronephrosis. Limited assessment of kidneys and mid abdominal structures due to adjacent bowel gas.  11/11/2019, DO 11/11/2019, 5:33 AM PGY-1, [place] [facility] FPTS Intern pager: [phone], text pages welcome

## 2019-11-11 NOTE — Progress Notes (Signed)
Spoke with RN Kathlene November, states patient has been calm and cooperative today. Went to see patient, he denies SI/HI. He is AxO x4, I believe that he has capacity. He tells me that he is willing to stay and continue treatment. Will d/c IVC.

## 2019-11-11 NOTE — Social Work (Signed)
8pm EDCSW called [place] [employer] and got verbal confirmation that IVC rescind paperwork was received.  10:49 CSW still waiting for return fax. Provider updated.

## 2019-11-11 NOTE — Significant Event (Signed)
Patient ambulated in the halls with RN and fiance, walked approximately 300 feet. Returned to room and settled in bed per patient's requests.

## 2019-11-12 LAB — CBC WITH DIFFERENTIAL/PLATELET
Abs Immature Granulocytes: 0.01 10*3/uL (ref 0.00–0.07)
Basophils Absolute: 0.1 10*3/uL (ref 0.0–0.1)
Basophils Relative: 1 %
Eosinophils Absolute: 0.1 10*3/uL (ref 0.0–0.5)
Eosinophils Relative: 1 %
HCT: 37.6 % — ABNORMAL LOW (ref 39.0–52.0)
Hemoglobin: 12.5 g/dL — ABNORMAL LOW (ref 13.0–17.0)
Immature Granulocytes: 0 %
Lymphocytes Relative: 36 %
Lymphs Abs: 2.2 10*3/uL (ref 0.7–4.0)
MCH: 30.3 pg (ref 26.0–34.0)
MCHC: 33.2 g/dL (ref 30.0–36.0)
MCV: 91.3 fL (ref 80.0–100.0)
Monocytes Absolute: 1.2 10*3/uL — ABNORMAL HIGH (ref 0.1–1.0)
Monocytes Relative: 19 %
Neutro Abs: 2.7 10*3/uL (ref 1.7–7.7)
Neutrophils Relative %: 43 %
Platelets: 244 10*3/uL (ref 150–400)
RBC: 4.12 MIL/uL — ABNORMAL LOW (ref 4.22–5.81)
RDW: 13.7 % (ref 11.5–15.5)
WBC: 6.3 10*3/uL (ref 4.0–10.5)
nRBC: 0 % (ref 0.0–0.2)

## 2019-11-12 LAB — BASIC METABOLIC PANEL
Anion gap: 7 (ref 5–15)
BUN: 10 mg/dL (ref 6–20)
CO2: 29 mmol/L (ref 22–32)
Calcium: 9 mg/dL (ref 8.9–10.3)
Chloride: 98 mmol/L (ref 98–111)
Creatinine, Ser: 0.75 mg/dL (ref 0.61–1.24)
GFR calc Af Amer: >60 mL/min (ref >60)
GFR calc non Af Amer: >60 mL/min (ref >60)
Glucose, Bld: 112 mg/dL — ABNORMAL HIGH (ref 70–99)
Potassium: 4.1 mmol/L (ref 3.5–5.1)
Sodium: 134 mmol/L — ABNORMAL LOW (ref 135–145)

## 2019-11-12 MED ORDER — GABAPENTIN 100 MG PO CAPS: 200.0000 mg | ORAL_CAPSULE | Freq: Every day | ORAL | 0 refills | Status: DC

## 2019-11-12 MED ORDER — NICOTINE 14 MG/24HR TD PT24: 14.0000 mg | MEDICATED_PATCH | Freq: Every day | TRANSDERMAL | 0 refills | Status: DC

## 2019-11-12 MED ORDER — TRAZODONE HCL 50 MG PO TABS: 50.0000 mg | ORAL_TABLET | Freq: Every day | ORAL | 0 refills | Status: DC

## 2019-11-12 MED ORDER — TRAZODONE HCL 50 MG PO TABS: 50.0000 mg | ORAL_TABLET | Freq: Every day | ORAL | Status: DC

## 2019-11-12 MED FILL — GABAPENTIN 100 MG CAPSULE: 100 | 15 days supply | Qty: 30 | Fill #0

## 2019-11-12 MED FILL — traZODone HCL 50 MG TABS: 50 | 30 days supply | Qty: 30 | Fill #0

## 2019-11-12 NOTE — Discharge Instructions (Signed)
You were hospitalized at [facility] due to alcohol withrawal. We have prescribed 30 days of Trazodone to help with sleep, as well as continuing the folic acid, thiamine and nicotine patch. Please be sure to follow-up with our clinic/PCP  at your earliest convenience.  Please return to the hospital for difficulty breathing, seizures, tremors, or other concerning symptoms.  Thank you for allowing [place] to take care of you.  Take care, [facility] family medicine team    Alcohol Withdrawal Syndrome Alcohol withdrawal syndrome is a group of symptoms that can develop when a person who drinks heavily and regularly stops drinking or drinks less. Alcohol withdrawal syndrome can be mild or severe, and it may even be life-threatening. Alcohol withdrawal syndrome usually affects people who have alcohol use disorder, which may also be called alcoholism. Alcohol use disorder is when a person is unable to control his or her alcohol use, and drinking too much or too often causes problems at home, at work, or in relationships. What are the causes? Drinking heavily and drinking on a regular basis cause changes in brain chemistry. Over time, the body becomes dependent on alcohol. When alcohol use stops, the chemistry system in the brain becomes unbalanced and causes the symptoms of alcohol withdrawal. What increases the risk? Alcohol withdrawal syndrome is more likely to occur in people who drink more than the recommended limit of alcohol (2 drinks a day for men or 1 drink a day for non-pregnant women). It is also more likely to affect heavy drinkers who have been using alcohol for long periods of time. The more a person drinks and the longer he or she drinks, the greater the risk of alcohol withdrawal syndrome. Severe withdrawal is more likely to develop in someone who:  Had severe alcohol withdrawal in the past.  Had a seizure during a previous episode of alcohol withdrawal.  Is elderly.  Uses other  drugs.  Has a long-term (chronic) medical problem, such as heart, lung, or liver disease.  Has depression.  Does not get enough nutrients from his or her diet (malnutrition). What are the signs or symptoms? Symptoms of this condition can be mild to moderate, or they can be severe. Symptoms may develop a few hours (or up to a day) after a person changes his or her drinking patterns. During the 48 hours after he or she has stopped drinking, the following symptoms may go away or get better:  Uncontrollable shaking (tremor).  Sweating.  Headache.  Anxiety.  Inability to relax (agitation).  Trouble sleeping (insomnia).  Irregular heartbeats (palpitations).  Alcohol cravings.  Seizure. The following symptoms may get worse 24-48 hours after a person has decreased or stopped alcohol use, and they may gradually improve over a period of days or weeks:  Nausea and vomiting.  Fatigue.  Sensitivity to light and sounds.  Confusion and inability to think clearly.  Loss of appetite.  Mood swings, irritability, depression, and anxiety.  Insomnia and nightmares. The following symptoms are severe and life-threatening. When these symptoms occur together, they are called delirium tremens (DTs):  High blood pressure.  Increased heart rate.  Trouble breathing.  Seizures. These may go away along with other symptoms, or they may persist.  Seeing, hearing, feeling, smelling, or tasting things that are not there (hallucinations). If you experience hallucinations, they usually begin 12-24 hours after a change in drinking patterns. Delirium tremens requires immediate hospitalization. How is this diagnosed? This condition may be diagnosed based on:  Your symptoms and  medical history.  Your history of alcohol use. Your health care provider may ask questions about your drinking behavior. It is important to be honest when you answer these questions.  A psychological assessment.  A  physical exam.  Blood tests or urine tests to measure blood alcohol level and to rule out other causes of symptoms.  MRI or CT scan. This may be done if you seem to have abnormal thinking or behaviors (altered mental status). Diagnosis can be difficult. People going through withdrawal often avoid seeking medical care and are not thinking clearly. Friends and family members play an important role in recognizing symptoms and encouraging loved ones to get treatment. How is this treated? Most people with symptoms of withdrawal can be treated outside of a hospital setting (outpatient treatment), with close monitoring such as daily check-ins with a health care provider and counseling. You may need treatment at a hospital or treatment center (inpatient treatment) if:  You have a history of delirium tremens or seizures.  You have severe symptoms.  You are addicted to other drugs.  You cannot swallow medicine.  You have a serious medical condition such as heart failure.  You experienced withdrawal in the past but then you continued drinking alcohol.  You are not likely to commit to an outpatient treatment schedule. Treatment may involve:  Monitoring your blood pressure, pulse, and breathing.  IV fluids to keep you hydrated.  Medicines to reduce withdrawal symptoms and discomfort (benzodiazepines).  Medicine to reduce anxiety.  Medicine to prevent or control seizures.  Multivitamins and B vitamins.  Having a health care provider check on you daily. It is important to get treatment for alcohol withdrawal early. Getting treatment early can:  Speed up your recovery from withdrawal symptoms.  Make you more successful with long-term stoppage of alcohol use (sobriety). If you need help to stop drinking, your health care provider may recommend a long-term treatment plan that includes:  Medicines to help treat alcohol use disorder.  Substance abuse counseling.  Support groups. Follow  these instructions at home:   Take over-the-counter and prescription medicines (including vitamin supplements) only as told by your health care provider.  Do not drink alcohol.  Do not drive until your health care provider approves.  Have someone you trust stay with you or be available if you need help with your symptoms or with not drinking.  Drink enough fluid to keep your urine pale yellow.  Consider joining an alcohol support group or treatment program. These can provide emotional support, advice, and guidance.  Keep all follow-up visits as told by your health care provider. This is important. Contact a health care provider if:  Your symptoms get worse instead of better.  You cannot eat or drink without vomiting.  You are struggling with not drinking alcohol.  You cannot stop drinking alcohol. Get help right away if:  You have an irregular heartbeat.  You have chest pain.  You have trouble breathing.  You have a seizure for the first time.  You hallucinate.  You become very confused. Summary  Alcohol withdrawal is a group of symptoms that can develop when a person who drinks heavily and regularly stops drinking or drinks less.  Symptoms of this condition can be mild to moderate, or they can be severe.  Treatment may include hospitalization, medicine, and counseling. This information is not intended to replace advice given to you by your health care provider. Make sure you discuss any questions you have with your health  care provider. Document Revised: 03/10/2017 Document Reviewed: 12/02/2016 Elsevier Patient Education  2020 [employer].

## 2019-11-12 NOTE — Progress Notes (Addendum)
Family Medicine Teaching Service Daily Progress Note Intern Pager: [phone]  Patient name: Jake Nguyen Medical record number: [id number] Date of birth: October 13, 1965 Age: 54 y.o. Gender: male  Primary Care Provider: Patient, No Pcp Per Consultants: Psychiatry, Transitions of Care  Code Status: Full   Pt Overview and Major Events to Date:  Admitted on 11/06/2019  Assessment and Plan: Jake Redner Jacksonis a 54 y.o.malepresenting with hallucinations and alcohol withdrawal. PMH is significant foralcoholism.   Alcohol Withdrawal w/ Hallucinations,improved Pt reports feeling very well today. Not interested  Received 2 mg IV Ativan yesterday. IVC rescinded, SW waiting on faxed confirmation from [place] office.   -CIWA score 1 (very slight tremor) w/ 0-4mg  IV ativan Q1H PRN.  Received one dose 2 mg today  -PRNAtivan6mg x1as needed for agitation. -folate -seizure precautionsplaced -continue to monitor for further withdrawal symptoms -counsel on excessive alcohol use once stable -Low threshold to call CCMshould patient become unstable - per psych consult: -"Continue CIWA and Lorazepam alcohol detox protocol  Consider adding Gabapentin 200 mg bid for alcohol withdrawal and agitation. Consider social worker consult to facilitate referring patient to outpatient alcohol rehab facility after he is medically stable" - received 1 dose gabapentin 200mg  this am (scheduled for BID) - trazodone 50pg qhs   Right Mandibular Cyst, stable,unchanged Not seen on admission but patient admits it has been there for several weeks. It is not painful but has grown some. It is mobile and does not appear to cause the patient distress at this time. Will need to monitor for acute changes since this is a new finding by the team. -Consider outpatient I&D   Xeroderma, chronic, stable Severe, scaly xerosis noted on LE bilaterally. Patient uses lotion at  home. -Vaseline  Hyponatremia,improved Na 134. has improved since admission.History of hyponatremia during previous hospitalizations. -monitor mental status changes -Continue to monitor BMP  Tobacco use Patient smokes half pack/day -Nicotine patch  FEN/GI: Regular diet  PPx: Lovenox  Disposition: Inpatient with close monitoring   Subjective:  Pt alert and pleasant in bed this morning. States he is feeling better. He requested Trazodone for sleep- stated it has helped him in the past. He stated he wanted to quit alcohol but was not interested in going to a rehab facility after leaving the hospital. Also not interested in meds to help him with quitting alcohol. He discussed that he picked up alcohol again after his brother's death. He is also not interested in talking to someone about everything he says he is better now.   Objective: Temp:  [98.8 F (37.1 C)-99.4 F (37.4 C)] 98.8 F (37.1 C) (08/03 0411) Pulse Rate:  [73-97] 84 (08/03 0411) Resp:  [14-20] 20 (08/03 0411) BP: (112-128)/(69-88) 112/69 (08/03 0411) SpO2:  [95 %-100 %] 96 % (08/03 0411) Physical Exam: General: alert, pleasant, in no acute distress Cardiovascular: RRR no murmurs rubs or gallops Respiratory: CTA bilaterally no wheezes, rales or rhonchi  Abdomen: soft, non-distended. Non tender to palpation  Extremities: no edema. Distal pulses 2+ bilaterally   Laboratory: Recent Labs  Lab 11/10/19 0244 11/11/19 0225 11/12/19 0359  WBC 6.8 6.0 6.3  HGB 12.1* 12.8* 12.5*  HCT 36.5* 37.9* 37.6*  PLT 157 224 244   Recent Labs  Lab 11/06/19 1200 11/06/19 1200 11/07/19 0049 11/08/19 0421 11/10/19 0244 11/11/19 0225 11/12/19 0359  NA 127*   < > 132*   < > 134* 135 134*  K 5.2*   < > 3.3*   < > 3.7 3.7  4.1  CL 90*   < > 98   < > 97* 98 98  CO2 19*   < > 22   < > 26 28 29   BUN 9   < > 12   < > 6 8 10   CREATININE 1.32*   < > 0.80   < > 0.69 0.75 0.75  CALCIUM 9.4   < > 8.2*   < > 9.5 9.1 9.0   PROT 10.3*  --  8.0  --   --   --   --   BILITOT 1.1  --  1.0  --   --   --   --   ALKPHOS 63  --  44  --   --   --   --   ALT 43  --  33  --   --   --   --   AST 181*  --  113*  --   --   --   --   GLUCOSE 101*   < > 87   < > 104* 107* 112*   < > = values in this interval not displayed.    Imaging/Diagnostic Tests:  Abdomen Complete Result Date: 11/09/2019 CLINICAL DATA: Alcoholism   IMPRESSION: 1. Coarse hepatic echotexture and increased echogenicity compatible with steatosis and question of early liver disease. 2. No acute biliary findings. 3. Mildly lobulated renal contours may represent scarring or congenital lobulation. No hydronephrosis. Limited assessment of kidneys and mid abdominal structures due to adjacent bowel gas.  [place], DO 11/12/2019, 5:44 AM PGY-1, [facility] FPTS Intern pager: [phone], text pages welcome

## 2019-11-12 NOTE — Progress Notes (Signed)
PT Cancellation Note  Patient Details Name: Jake Nguyen MRN: [id number] DOB: [birth year]   Cancelled Treatment:    Reason Eval/Treat Not Completed: Other (comment).  Pt is verbalizing that he is getting around with no issues, and agreed to let PT check in tomorrow to be sure he is still fine.  Asked pt to let nursing know if he needs anything.   [name] 11/12/2019, 11:56 AM   Samul Dada, PT MS Acute Rehab Dept. Number: [facility] [id number] and [facility] [phone]

## 2019-11-13 NOTE — Discharge Summary (Addendum)
Family Medicine Teaching [facility] Discharge Summary  Patient name: Jake Nguyen Medical record number: [id number] Date of birth: 10-05-1965 Age: 54 y.o. Gender: male Date of Admission: 11/06/2019  Date of Discharge: 11/12/2019 Admitting Physician: Carney Living, MD  Primary Care Provider: Patient, No Pcp Per Consultants: Psychiatry   Indication for Hospitalization: Alcohol withdrawal  Discharge Diagnoses/Problem List:  Alcohol withdrawal, resolved  Unsteady gait  Disposition: Home  Discharge Condition: Stable   Discharge Exam:  Temp:  [98.1 F (36.7 C)-99.1 F (37.3 C)] 98.5 F (36.9 C) (08/02 0457) Pulse Rate:  [83-110] 91 (08/02 0457) Resp:  [18-20] 18 (08/02 0457) BP: (95-145)/(62-85) 103/85 (08/02 0457) SpO2:  [96 %-100 %] 96 % (08/02 0457) Physical Exam: General: Pleasant, sitting in bed eating breakfast  Cardiovascular: RRR. No murmurs, rubs, or gallops Respiratory: CTA. No wheezes, rales, rhonchi  Abdomen: Soft, non-tender, non distended  Psych: A&O x3  Brief Hospital Course:   Patient presented on 7/28 with hallucinations, likely secondary to alcohol withdrawal. Poor historian at admission due to withdrawal state. Appeared to be experiencing auditory and visual hallucinations.  Initial physical exam limited due to patient not being able to follow commands. Ethanol 37, baseline appears to be in the 300-400s. AST 181, ALT 43, platelet count 69, anion gap 18.  Ativan 4 mg given, followed by Ativan 2 mg every 4 hours followed by Ativan 2 mg every 6 hours. CCM consulted and recommended a dose of phenobarbital 130 mg. Folate and thiamine given. BHB 0.77, given D5NS, half maintenance fluids for 7 hours. Psych was consulted who recommended Gabapentin 200mg  twice a day for his alcohol withdrawal and agitation. CIWA scores were recorded throughout his hospitalization until he reached a score of 1 for very slight tremors.  Abdominal ultrasound showed steatosis and  question of early liver disease. Throughout the remainder of his hospital course he continued to progress until discharge on 8/3, denying auditory and visual hallucinations as well as suicidal or homicidal ideations. Patient denied rehab after discharge or medication to help with his alcohol abuse.   Issues for Follow Up:  Follow up with [facility] for routine care  Significant Procedures: None  Significant Labs and Imaging:  Recent Labs  Lab 11/10/19 0244 11/11/19 0225 11/12/19 0359  WBC 6.8 6.0 6.3  HGB 12.1* 12.8* 12.5*  HCT 36.5* 37.9* 37.6*  PLT 157 224 244   Recent Labs  Lab 11/07/19 0049 11/07/19 0049 11/08/19 0421 11/08/19 0421 11/09/19 0202 11/09/19 0202 11/10/19 0244 11/10/19 0244 11/11/19 0225 11/12/19 0359  NA 132*   < > 133*  --  133*  --  134*  --  135 134*  K 3.3*   < > 3.4*   < > 4.3   < > 3.7   < > 3.7 4.1  CL 98   < > 98  --  100  --  97*  --  98 98  CO2 22   < > 26  --  23  --  26  --  28 29  GLUCOSE 87   < > 104*  --  112*  --  104*  --  107* 112*  BUN 12   < > 8  --  5*  --  6  --  8 10  CREATININE 0.80   < > 0.63  --  0.62  --  0.69  --  0.75 0.75  CALCIUM 8.2*   < > 8.5*  --  9.1  --  9.5  --  9.1 9.0  MG  --   --  2.1  --   --   --   --   --   --   --   ALKPHOS 44  --   --   --   --   --   --   --   --   --   AST 113*  --   --   --   --   --   --   --   --   --   ALT 33  --   --   --   --   --   --   --   --   --   ALBUMIN 3.5  --   --   --   --   --   --   --   --   --    < > = values in this interval not displayed.    Results/Tests Pending at Time of Discharge:   US Abdomen Complete  Result Date: 11/09/2019 CLINICAL DATA:  Alcoholism   IMPRESSION: 1. Coarse hepatic echotexture and increased echogenicity compatible with steatosis and question of early liver disease. 2. No acute biliary findings. 3. Mildly lobulated renal contours may represent scarring or congenital lobulation. No hydronephrosis. Limited assessment of  kidneys and mid abdominal structures due to adjacent bowel gas.  Discharge Medications:  Allergies as of 11/12/2019       Reactions   Pork-derived Products Other (See Comments)   Pt states he doesn't eat pork        Medication List     STOP taking these medications    ibuprofen 400 MG tablet Commonly known as: ADVIL   metoprolol tartrate 25 MG tablet Commonly known as: LOPRESSOR   predniSONE 10 MG tablet Commonly known as: DELTASONE       TAKE these medications    gabapentin 100 MG capsule Commonly known as: NEURONTIN Take 2 capsules (200 mg total) by mouth at bedtime.   nicotine 14 mg/24hr patch Commonly known as: NICODERM CQ - dosed in mg/24 hours Place 1 patch (14 mg total) onto the skin daily.   traZODone 50 MG tablet Commonly known as: DESYREL Take 1 tablet (50 mg total) by mouth at bedtime.        Discharge Instructions: Please refer to Patient Instructions section of EMR for full details.  Patient was counseled important signs and symptoms that should prompt return to medical care, changes in medications, dietary instructions, activity restrictions, and follow up appointments.   Follow-Up Appointments:  Patient to follow-up with PCP  Bethena Midget, DO 11/13/2019, 2:26 PM PGY-1, [facility]   FPTS Upper-Level Resident Addendum I have independently interviewed and examined the patient. I have discussed the above with the original author and agree with their documentation. Please see also any attending notes.    Peggyann [place], DO PGY-2, [facility] 11/13/2019 5:53 PM  FPTS Service pager: [phone] (text pages welcome through [facility])

## 2019-12-04 ENCOUNTER — Encounter (HOSPITAL_COMMUNITY): Payer: Self-pay

## 2019-12-04 ENCOUNTER — Ambulatory Visit (HOSPITAL_COMMUNITY)
Admission: EM | Admit: 2019-12-04 | Discharge: 2019-12-04 | Disposition: A | Discharge status: Home / Self Care | Payer: Self-pay | Attending: Internal Medicine | Admitting: Internal Medicine

## 2019-12-04 ENCOUNTER — Other Ambulatory Visit: Payer: Self-pay

## 2019-12-04 DIAGNOSIS — L989 Disorder of the skin and subcutaneous tissue, unspecified: Secondary | ICD-10-CM

## 2019-12-04 DIAGNOSIS — H6121 Impacted cerumen, right ear: Secondary | ICD-10-CM

## 2019-12-04 NOTE — ED Triage Notes (Addendum)
Pt c/o painful swollen area to right jaw x 1 year

## 2019-12-04 NOTE — Discharge Instructions (Signed)
I have referred you to [facility] health family medicine.  They will call you to schedule an appointment.

## 2019-12-04 NOTE — ED Provider Notes (Signed)
[facility]    CSN: [id number] Arrival date & time: 12/04/19  6606     History   Chief Complaint Chief Complaint  Patient presents with  . Cyst    HPI Jake Nguyen is a 54 y.o. male with past medical history of EtOH abuse presents today with complaints of swollen area to right jaw x1 year.  Patient states he has been seen in the emergency department for same but has not had any treatment.  Patient states area is nontender and has remained unchanged though he does feel like it has started affecting his hearing.  Patient denies any recent headache, dizziness, fever.  Doing over-the-counter earwax softener this week.    Past Medical History:  Diagnosis Date  . Alcoholism [facility])     Patient Active Problem List   Diagnosis Date Noted  . Ataxia   . Alcohol abuse with alcohol-induced mood disorder (HCC) 11/10/2019  . Alcoholism /alcohol abuse (HCC)   . Alcohol withdrawal syndrome, with delirium (HCC) 11/06/2019  . Pressure injury of skin 05/25/2018  . Rhabdomyolysis 05/20/2018  . Hyponatremia 05/20/2018  . Elevated troponin 05/20/2018  . Moderate dehydration 05/20/2018  . Alcohol use 05/20/2018  . Influenza A 05/20/2018  . Transaminitis 05/20/2018    History reviewed. No pertinent surgical history.    Home Medications    Prior to Admission medications   Medication Sig Start Date End Date Taking? Authorizing Provider  gabapentin (NEURONTIN) 100 MG capsule Take 2 capsules (200 mg total) by mouth at bedtime. 11/12/19   Cora Collum, DO  nicotine (NICODERM CQ - DOSED IN MG/24 HOURS) 14 mg/24hr patch Place 1 patch (14 mg total) onto the skin daily. 11/13/19   Cora Collum, DO  traZODone (DESYREL) 50 MG tablet Take 1 tablet (50 mg total) by mouth at bedtime. 11/12/19   Cora Collum, DO    Family History Family History  Family history unknown: Yes    Social History Social History   Tobacco Use  . Smoking status: Current Every Day Smoker     Packs/day: 0.50    Types: Cigarettes  . Smokeless tobacco: Never Used  Substance Use Topics  . Alcohol use: Yes  . Drug use: Not on file     Allergies   Pork-derived products   Review of Systems As stated in HPI otherwise negative   Physical Exam Triage Vital Signs ED Triage Vitals  Enc Vitals Group     BP 12/04/19 1022 107/74     Pulse Rate 12/04/19 1021 60     Resp 12/04/19 1020 16     Temp 12/04/19 1020 97.8 F (36.6 C)     Temp src --      SpO2 12/04/19 1021 97 %     Weight --      Height --      Head Circumference --      Peak Flow --      Pain Score 12/04/19 1021 8     Pain Loc --      Pain Edu? --      Excl. in GC? --    No data found.  Updated Vital Signs BP 107/74   Pulse 60   Temp 97.8 F (36.6 C)   Resp 16   SpO2 97%       Physical Exam Constitutional:      General: He is not in acute distress.    Appearance: Normal appearance. He is not ill-appearing.  HENT:  Right Ear: There is impacted cerumen.  Musculoskeletal:     Cervical back: Normal range of motion.  Skin:    General: Skin is warm and dry.     Findings: Lesion present.     Comments: Large approximately 3 cm lesion along right jawline.  Lesion nontender and movable.  No surrounding erythema  Neurological:     Mental Status: He is alert.  Psychiatric:        Mood and Affect: Mood normal.        Behavior: Behavior normal.     UC Treatments / Results  Labs (all labs ordered are listed, but only abnormal results are displayed) Labs Reviewed - No data to display  EKG  Radiology No results found.  Procedures Procedures (including critical care time)  Medications Ordered in UC Medications - No data to display  Initial Impression / Assessment and Plan / UC Course  I have reviewed the triage vital signs and the nursing notes.  Pertinent labs & imaging results that were available during my care of the patient were reviewed by me and considered in my medical decision  making (see chart for details).  Cyst, right facial -exam c/w cystic lesion present for approximately 81yr -given location and size as well as repeated visits, will refer to [facility] to discuss removal  Cerumen impaction -small amt removed with curette -pt declined irrigation -continue using OTC debrox. Ok to irrigate at home after using gtts x 1 week -f/u as needed   Final Clinical Impressions(s) / UC Diagnoses   Final diagnoses:  Facial lesion  Impacted cerumen of right ear     Discharge Instructions     I have referred you to [facility] health family medicine.  They will call you to schedule an appointment.    ED Prescriptions    None     PDMP not reviewed this encounter.   Rolla Etienne, NP 12/04/19 1053

## 2020-07-14 ENCOUNTER — Emergency Department (HOSPITAL_COMMUNITY): Payer: Medicaid Other

## 2020-07-14 ENCOUNTER — Inpatient Hospital Stay (HOSPITAL_COMMUNITY)
Admission: EM | Admit: 2020-07-14 | Discharge: 2020-07-14 | DRG: 300 | Discharge status: Against Medical Advice | Payer: Medicaid Other | Attending: Internal Medicine | Admitting: Internal Medicine

## 2020-07-14 ENCOUNTER — Other Ambulatory Visit: Payer: Self-pay

## 2020-07-14 ENCOUNTER — Encounter (HOSPITAL_COMMUNITY): Payer: Self-pay

## 2020-07-14 DIAGNOSIS — I96 Gangrene, not elsewhere classified: Principal | ICD-10-CM | POA: Diagnosis present

## 2020-07-14 DIAGNOSIS — Z5329 Procedure and treatment not carried out because of patient's decision for other reasons: Secondary | ICD-10-CM | POA: Diagnosis not present

## 2020-07-14 DIAGNOSIS — F10920 Alcohol use, unspecified with intoxication, uncomplicated: Secondary | ICD-10-CM

## 2020-07-14 DIAGNOSIS — E871 Hypo-osmolality and hyponatremia: Secondary | ICD-10-CM | POA: Diagnosis present

## 2020-07-14 DIAGNOSIS — F17210 Nicotine dependence, cigarettes, uncomplicated: Secondary | ICD-10-CM | POA: Diagnosis present

## 2020-07-14 DIAGNOSIS — D649 Anemia, unspecified: Secondary | ICD-10-CM | POA: Diagnosis present

## 2020-07-14 DIAGNOSIS — Z20822 Contact with and (suspected) exposure to covid-19: Secondary | ICD-10-CM | POA: Diagnosis present

## 2020-07-14 DIAGNOSIS — F10220 Alcohol dependence with intoxication, uncomplicated: Secondary | ICD-10-CM | POA: Diagnosis present

## 2020-07-14 DIAGNOSIS — Z5900 Homelessness unspecified: Secondary | ICD-10-CM | POA: Diagnosis not present

## 2020-07-14 DIAGNOSIS — Z79899 Other long term (current) drug therapy: Secondary | ICD-10-CM | POA: Diagnosis not present

## 2020-07-14 LAB — COMPREHENSIVE METABOLIC PANEL
ALT: 14 U/L (ref 0–44)
AST: 19 U/L (ref 15–41)
Albumin: 2.7 g/dL — ABNORMAL LOW (ref 3.5–5.0)
Alkaline Phosphatase: 58 U/L (ref 38–126)
Anion gap: 12 (ref 5–15)
BUN: 6 mg/dL (ref 6–20)
CO2: 25 mmol/L (ref 22–32)
Calcium: 7.7 mg/dL — ABNORMAL LOW (ref 8.9–10.3)
Chloride: 97 mmol/L — ABNORMAL LOW (ref 98–111)
Creatinine, Ser: 0.49 mg/dL — ABNORMAL LOW (ref 0.61–1.24)
GFR, Estimated: >60 mL/min (ref >60)
Glucose, Bld: 99 mg/dL (ref 70–99)
Potassium: 3.5 mmol/L (ref 3.5–5.1)
Sodium: 134 mmol/L — ABNORMAL LOW (ref 135–145)
Total Bilirubin: 0.5 mg/dL (ref 0.3–1.2)
Total Protein: 8 g/dL (ref 6.5–8.1)

## 2020-07-14 LAB — CBC WITH DIFFERENTIAL/PLATELET
Abs Immature Granulocytes: 0.02 10*3/uL (ref 0.00–0.07)
Basophils Absolute: 0.1 10*3/uL (ref 0.0–0.1)
Basophils Relative: 1 %
Eosinophils Absolute: 0 10*3/uL (ref 0.0–0.5)
Eosinophils Relative: 0 %
HCT: 32.5 % — ABNORMAL LOW (ref 39.0–52.0)
Hemoglobin: 10.7 g/dL — ABNORMAL LOW (ref 13.0–17.0)
Immature Granulocytes: 0 %
Lymphocytes Relative: 36 %
Lymphs Abs: 2.6 10*3/uL (ref 0.7–4.0)
MCH: 28 pg (ref 26.0–34.0)
MCHC: 32.9 g/dL (ref 30.0–36.0)
MCV: 85.1 fL (ref 80.0–100.0)
Monocytes Absolute: 0.7 10*3/uL (ref 0.1–1.0)
Monocytes Relative: 9 %
Neutro Abs: 4 10*3/uL (ref 1.7–7.7)
Neutrophils Relative %: 54 %
Platelets: 210 10*3/uL (ref 150–400)
RBC: 3.82 MIL/uL — ABNORMAL LOW (ref 4.22–5.81)
RDW: 14.5 % (ref 11.5–15.5)
WBC: 7.4 10*3/uL (ref 4.0–10.5)
nRBC: 0 % (ref 0.0–0.2)

## 2020-07-14 LAB — ETHANOL: Alcohol, Ethyl (B): 247 mg/dL — ABNORMAL HIGH (ref <10)

## 2020-07-14 LAB — LACTIC ACID, PLASMA: Lactic Acid, Venous: 1.3 mmol/L (ref 0.5–1.9)

## 2020-07-14 LAB — SARS CORONAVIRUS 2 (TAT 6-24 HRS): SARS Coronavirus 2: NEGATIVE

## 2020-07-14 MED ORDER — MORPHINE SULFATE (PF) 4 MG/ML IV SOLN
4.0000 mg | Freq: Once | INTRAVENOUS | Status: AC
  Administered 2020-07-14: 4 mg via INTRAVENOUS
  Filled 2020-07-14: qty 1

## 2020-07-14 MED ORDER — VANCOMYCIN HCL IN DEXTROSE 1-5 GM/200ML-% IV SOLN
1000.0000 mg | Freq: Once | INTRAVENOUS | Status: AC
  Administered 2020-07-14: 1000 mg via INTRAVENOUS
  Filled 2020-07-14: qty 200

## 2020-07-14 MED ORDER — LACTATED RINGERS IV BOLUS
1000.0000 mL | Freq: Once | INTRAVENOUS | Status: AC
  Administered 2020-07-14: 1000 mL via INTRAVENOUS

## 2020-07-14 MED ORDER — CALCIUM CARBONATE ANTACID 500 MG PO CHEW: 200.0000 mg | CHEWABLE_TABLET | Freq: Two times a day (BID) | ORAL | Status: DC

## 2020-07-14 NOTE — ED Provider Notes (Signed)
I spoke with Dr Arbie Cookey.  Reviewed images in the chart.  Requests admission to [facility], pt will likely need amputation.   Linwood Dibbles, MD 07/14/20 [phone]

## 2020-07-14 NOTE — ED Provider Notes (Addendum)
[facility]-EMERGENCY DEPT Provider Note   CSN: [id number] Arrival date & time: 07/14/20  0347   History Chief Complaint  Patient presents with  . Foot Pain  . Alcohol Intoxication    Jake Nguyen is a 55 y.o. male.  The history is provided by the patient.  Foot Pain  Alcohol Intoxication  He has history of alcohol abuse and comes in complaining of a painful left foot.  He states it has been hurting for several weeks.  Unfortunately, he is a very poor and vague historian.  Pain has been increasing to the point where he says it hurts a lot but he will not put a number on the pain.  He denies fever or chills.  He does admit to drinking approximately 6 pack of beer a day and admits to smoking 1-1.5 packs of cigarettes a day.  He denies other drug use.  He is homeless.  Past Medical History:  Diagnosis Date  . Alcoholism [facility])     Patient Active Problem List   Diagnosis Date Noted  . Ataxia   . Alcohol abuse with alcohol-induced mood disorder (HCC) 11/10/2019  . Alcoholism /alcohol abuse   . Alcohol withdrawal syndrome, with delirium (HCC) 11/06/2019  . Pressure injury of skin 05/25/2018  . Rhabdomyolysis 05/20/2018  . Hyponatremia 05/20/2018  . Elevated troponin 05/20/2018  . Moderate dehydration 05/20/2018  . Alcohol use 05/20/2018  . Influenza A 05/20/2018  . Transaminitis 05/20/2018    History reviewed. No pertinent surgical history.     Family History  Family history unknown: Yes    Social History   Tobacco Use  . Smoking status: Current Every Day Smoker    Packs/day: 0.50    Types: Cigarettes  . Smokeless tobacco: Never Used  Substance Use Topics  . Alcohol use: Yes    Home Medications Prior to Admission medications   Medication Sig Start Date End Date Taking? Authorizing Provider  gabapentin (NEURONTIN) 100 MG capsule Take 2 capsules (200 mg total) by mouth at bedtime. 11/12/19   Cora Collum, DO  nicotine (NICODERM CQ -  DOSED IN MG/24 HOURS) 14 mg/24hr patch Place 1 patch (14 mg total) onto the skin daily. 11/13/19   Cora Collum, DO  traZODone (DESYREL) 50 MG tablet Take 1 tablet (50 mg total) by mouth at bedtime. 11/12/19   Cora Collum, DO    Allergies    Pork-derived products  Review of Systems   Review of Systems  All other systems reviewed and are negative.   Physical Exam Updated Vital Signs BP 125/80 (BP Location: Left Arm)   Pulse (!) 110   Temp 98 F (36.7 C) (Oral)   Resp 16   Ht 6' (1.829 m)   Wt 69 kg   SpO2 93%   BMI 20.63 kg/m   Physical Exam Vitals and nursing note reviewed.   55 year old male, resting comfortably and in no acute distress. Vital signs are significant for elevated heart rate. Oxygen saturation is 93%, which is normal. Head is normocephalic and atraumatic. PERRLA, EOMI. Oropharynx is clear. Neck is nontender and supple without adenopathy or JVD. Back is nontender and there is no CVA tenderness. Lungs are clear without rales, wheezes, or rhonchi. Chest is nontender. Heart has regular rate and rhythm without murmur. Abdomen is soft, flat, nontender without masses or hepatosplenomegaly and peristalsis is normoactive. Extremities: Left foot is cold from the midfoot region, distally.  Toes on the left foot are  deeply cyanotic.  There is no crepitus.  Right foot is warm and dorsalis pedis pulse palpable. Skin is warm and dry without rash. Neurologic: Sleepy but arousable, answers questions appropriately.  Moves all extremities, but resists moving his left leg because of pain in the foot.   ED Results / Procedures / Treatments   Labs (all labs ordered are listed, but only abnormal results are displayed) Labs Reviewed  CBC WITH DIFFERENTIAL/PLATELET  ETHANOL  LACTIC ACID, PLASMA  COMPREHENSIVE METABOLIC PANEL    EKG None  Radiology DG Foot Complete Left  Result Date: 07/14/2020 CLINICAL DATA:  Injury. EXAM: LEFT FOOT - COMPLETE 3+ VIEW COMPARISON:   05/26/2018. FINDINGS: Soft tissue air is noted diffusely about the medial aspect of the left foot. No radiopaque foreign body. No acute bony or joint abnormality. No bony erosions. Degenerative changes first MTP joint. IMPRESSION: Soft tissue air is noted about the medial aspect of the left foot. No radiopaque foreign body. No acute bony abnormality identified. No bony erosions noted. Electronically Signed   By: Maisie Fus  Register   On: 07/14/2020 05:09    Procedures Procedures   Medications Ordered in ED Medications  lactated ringers bolus 1,000 mL (has no administration in time range)  morphine 4 MG/ML injection 4 mg (has no administration in time range)    ED Course  I have reviewed the triage vital signs and the nursing notes.  Pertinent labs & imaging results that were available during my care of the patient were reviewed by me and considered in my medical decision making (see chart for details).  MDM Rules/Calculators/A&P Tissue necrosis of the left foot.  X-rays show gas in the soft tissues of the medial aspect of the left midfoot.  He will likely need midfoot amputation at a minimum, possible below the knee amputation.  He is started on IV antibiotics and will plan to admit when screening labs are back.  Old records are reviewed, and he has no relevant past visits.  WBC is normal with normal differential.  Hemoglobin has dropped compared with last year.  Ethanol level is 247 indicating significant alcohol intoxication.  Borderline hyponatremia is present which is not felt to be clinically significant.  Lactic acid level is normal.  Case is discussed with Dr. Ronaldo Miyamoto of [facility], who agrees to admit the patient.  Vascular surgery consult will be obtained.  Final Clinical Impression(s) / ED Diagnoses Final diagnoses:  Gangrene of left foot (HCC)  Alcohol intoxication, uncomplicated (HCC)  Normochromic normocytic anemia  Hyponatremia    Rx / DC Orders ED Discharge Orders     None       Dione Booze, MD 07/14/20 5284    Dione Booze, MD 07/14/20 [phone]

## 2020-07-14 NOTE — Progress Notes (Deleted)
Notified that patient wishes to leave AMA. I spoke with him in room about his plan of care. I informed him that his foot is gangrenous; and without treatment, he is likely to become septic. Consequences of sepsis can include death. He states that he is aware that can happen. He states he is willing to accept the risks. He is currently A&O x 4. AMA paper work has been given.   Teddy Spike, DO

## 2020-07-14 NOTE — Discharge Summary (Signed)
AMA DISCHARGE SUMMARY  Notified that patient wishes to leave AMA. I spoke with him in room about his plan of care. I informed him that his foot is gangrenous; and without treatment, he is likely to become septic. Consequences of sepsis can include death. He states that he is aware that can happen. He states he is willing to accept the risks. He is currently A&O x 4. AMA paper work has been given.   Final Dx: Gangrene of left foot EtOH abuse Normocytic anemia Hypocalcemia Hyponatremia  Recommended against leaving AMA. He chose otherwise. Recommend he follow up with physician as soon as possible.

## 2020-07-14 NOTE — H&P (Addendum)
History and Physical    Jake Nguyen VZD:[id number] DOB: 1966/03/29 DOA: 07/14/2020  PCP: Patient, No Pcp Per (Inactive)  Patient coming from: Home  Chief Complaint: foot pain  HPI: OSA Jake Nguyen is a 55 y.o. male with medical history significant of EtOH abuse. Presenting with left foot pain. He gives limited history. He reports that he has had left foot pain for a long time, but can not give me an estimate. He says that he was in the hospital recently and was told he was ok. He says that he foot continued to have sharp pain through last night. It was last night that he could not stand on it anymore, so he decided to come to the hospital. He denies any other aggravating or alleviating factors.    ED Course: XR foot showed soft tissue airabout the medial aspect of the left foot. He was started on vanc. Vascular Surgery was consulted. [facility] was called for admission.   Review of Systems:  Denies CP, dyspnea, palpitations, N/V/D, fevers. Review of systems is otherwise negative for all not mentioned in HPI.   PMHx Past Medical History:  Diagnosis Date  . Alcoholism (HCC)     PSHx History reviewed. No pertinent surgical history.  SocHx  reports that he has been smoking cigarettes. He has been smoking about 0.50 packs per day. He has never used smokeless tobacco. He reports current alcohol use. No history on file for drug use.  Allergies  Allergen Reactions  . Pork-Derived Products Other (See Comments)    Pt states he doesn't eat pork    FamHx Family History  Family history unknown: Yes    Prior to Admission medications   Medication Sig Start Date End Date Taking? Authorizing Provider  traZODone (DESYREL) 50 MG tablet Take 1 tablet (50 mg total) by mouth at bedtime. Patient not taking: Reported on 07/14/2020 11/12/19   Cora Collum, DO    Physical Exam: Vitals:   07/14/20 0404 07/14/20 0610 07/14/20 0700 07/14/20 0804  BP:  114/62 128/80 114/74  Pulse:  (!) 119 (!) 113  (!) 122  Resp:  16 16 18   Temp:      TempSrc:      SpO2:  93% 96% 92%  Weight: 69 kg     Height: 6' (1.829 m)       General: 55 y.o. male resting in bed in NAD Eyes: PERRL, normal sclera ENMT: Nares patent w/o discharge, orophaynx clear, poor dentition, ears w/o discharge/lesions/ulcers Neck: Supple, trachea midline Cardiovascular: RRR, +S1, S2, no m/g/r, equal pulses throughout Respiratory: CTABL, no w/r/r, normal WOB GI: BS+, NDNT, no masses noted, no organomegaly noted MSK: gangrene of left foot w/ breakage in skin;no draining noted, BLE dry skin noted, No edema.  Skin: chronic facial lesion of left orbit, non-bloody, nod-draining Neuro: A&O x 3, no focal deficits Psyc: Appropriate interaction and affect, calm/cooperative  Labs on Admission: I have personally reviewed following labs and imaging studies  CBC: Recent Labs  Lab 07/14/20 0615  WBC 7.4  NEUTROABS 4.0  HGB 10.7*  HCT 32.5*  MCV 85.1  PLT 210   Basic Metabolic Panel: Recent Labs  Lab 07/14/20 0615  NA 134*  K 3.5  CL 97*  CO2 25  GLUCOSE 99  BUN 6  CREATININE 0.49*  CALCIUM 7.7*   GFR: Estimated Creatinine Clearance: 105.4 mL/min (A) (by C-G formula based on SCr of 0.49 mg/dL (L)). Liver Function Tests: Recent Labs  Lab 07/14/20 0615  AST  19  ALT 14  ALKPHOS 58  BILITOT 0.5  PROT 8.0  ALBUMIN 2.7*   No results for input(s): LIPASE, AMYLASE in the last 168 hours. No results for input(s): AMMONIA in the last 168 hours. Coagulation Profile: No results for input(s): INR, PROTIME in the last 168 hours. Cardiac Enzymes: No results for input(s): CKTOTAL, CKMB, CKMBINDEX, TROPONINI in the last 168 hours. BNP (last 3 results) No results for input(s): PROBNP in the last 8760 hours. HbA1C: No results for input(s): HGBA1C in the last 72 hours. CBG: No results for input(s): GLUCAP in the last 168 hours. Lipid Profile: No results for input(s): CHOL, HDL, LDLCALC, TRIG, CHOLHDL, LDLDIRECT in the  last 72 hours. Thyroid Function Tests: No results for input(s): TSH, T4TOTAL, FREET4, T3FREE, THYROIDAB in the last 72 hours. Anemia Panel: No results for input(s): VITAMINB12, FOLATE, FERRITIN, TIBC, IRON, RETICCTPCT in the last 72 hours. Urine analysis:    Component Value Date/Time   COLORURINE YELLOW 05/20/2018 1458   APPEARANCEUR CLEAR 05/20/2018 1458   LABSPEC 1.024 05/20/2018 1458   PHURINE 6.0 05/20/2018 1458   GLUCOSEU NEGATIVE 05/20/2018 1458   HGBUR LARGE (A) 05/20/2018 1458   BILIRUBINUR NEGATIVE 05/20/2018 1458   KETONESUR 80 (A) 05/20/2018 1458   PROTEINUR >=300 (A) 05/20/2018 1458   UROBILINOGEN 0.2 08/02/2009 2144   NITRITE NEGATIVE 05/20/2018 1458   LEUKOCYTESUR NEGATIVE 05/20/2018 1458    Radiological Exams on Admission: DG Foot Complete Left  Result Date: 07/14/2020 CLINICAL DATA:  Injury. EXAM: LEFT FOOT - COMPLETE 3+ VIEW COMPARISON:  05/26/2018. FINDINGS: Soft tissue air is noted diffusely about the medial aspect of the left foot. No radiopaque foreign body. No acute bony or joint abnormality. No bony erosions. Degenerative changes first MTP joint. IMPRESSION: Soft tissue air is noted about the medial aspect of the left foot. No radiopaque foreign body. No acute bony abnormality identified. No bony erosions noted. Electronically Signed   By: Maisie Fus  Register   On: 07/14/2020 05:09   Assessment/Plan Gangrene of left foot     - admit to inpt, tele @ [facility]     - VVS consulted (Dr. Arbie Cookey), will see at [facility], appreciate assistance     - continue vanco, fluids  EtOH abuse     - CIWA protocol     - check thiamine, B12     - daily thiamine, B12, folate     - banana bag x 1  Normocytic anemia     - no evidence of bleed     - check iron studies  Hypocalcemia Hyponatremia     - fluids, add TUMS w/ meals (corrected Ca2+ is 8.7)  DVT prophylaxis: lovenox  Code Status: FULL  Family Communication: None at bedside  Consults called: EDP spoke with VVS   Status is:  Inpatient  Remains inpatient appropriate because:Inpatient level of care appropriate due to severity of illness   Dispo: The patient is from: Home              Anticipated d/c is to: TBD              Patient currently is not medically stable to d/c.   Difficult to place patient No  Teddy Spike DO [facility]  If 7PM-7AM, please contact night-coverage [email or web address]  07/14/2020, 8:49 AM

## 2020-07-14 NOTE — ED Notes (Signed)
Pt wants to leave, MD made aware and came to see pt. Pt alert and A&O X4, Dr Ronaldo Miyamoto states Pt is aware that the infection in his foot will spread if not treated and pt accepted that responsibility. Pt leaving AMA
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
## 2020-07-31 ENCOUNTER — Other Ambulatory Visit: Payer: Self-pay

## 2020-07-31 ENCOUNTER — Emergency Department (HOSPITAL_COMMUNITY): Payer: Medicaid Other

## 2020-07-31 ENCOUNTER — Inpatient Hospital Stay (HOSPITAL_COMMUNITY)
Admission: EM | Admit: 2020-07-31 | Discharge: 2020-08-27 | DRG: 240 | Disposition: A | Discharge status: Skilled Nursing Facility | Payer: Medicaid Other | Attending: Internal Medicine | Admitting: Internal Medicine

## 2020-07-31 DIAGNOSIS — Z89512 Acquired absence of left leg below knee: Secondary | ICD-10-CM

## 2020-07-31 DIAGNOSIS — F419 Anxiety disorder, unspecified: Secondary | ICD-10-CM | POA: Diagnosis not present

## 2020-07-31 DIAGNOSIS — Z23 Encounter for immunization: Secondary | ICD-10-CM

## 2020-07-31 DIAGNOSIS — R45851 Suicidal ideations: Secondary | ICD-10-CM | POA: Diagnosis present

## 2020-07-31 DIAGNOSIS — I739 Peripheral vascular disease, unspecified: Secondary | ICD-10-CM | POA: Diagnosis not present

## 2020-07-31 DIAGNOSIS — M792 Neuralgia and neuritis, unspecified: Secondary | ICD-10-CM | POA: Diagnosis not present

## 2020-07-31 DIAGNOSIS — D62 Acute posthemorrhagic anemia: Secondary | ICD-10-CM | POA: Diagnosis not present

## 2020-07-31 DIAGNOSIS — F17203 Nicotine dependence unspecified, with withdrawal: Secondary | ICD-10-CM | POA: Diagnosis present

## 2020-07-31 DIAGNOSIS — Z20822 Contact with and (suspected) exposure to covid-19: Secondary | ICD-10-CM | POA: Diagnosis present

## 2020-07-31 DIAGNOSIS — F1024 Alcohol dependence with alcohol-induced mood disorder: Secondary | ICD-10-CM | POA: Diagnosis present

## 2020-07-31 DIAGNOSIS — E860 Dehydration: Secondary | ICD-10-CM | POA: Diagnosis present

## 2020-07-31 DIAGNOSIS — Z993 Dependence on wheelchair: Secondary | ICD-10-CM | POA: Diagnosis not present

## 2020-07-31 DIAGNOSIS — F39 Unspecified mood [affective] disorder: Secondary | ICD-10-CM | POA: Diagnosis not present

## 2020-07-31 DIAGNOSIS — R5381 Other malaise: Secondary | ICD-10-CM | POA: Diagnosis not present

## 2020-07-31 DIAGNOSIS — F1014 Alcohol abuse with alcohol-induced mood disorder: Secondary | ICD-10-CM | POA: Diagnosis not present

## 2020-07-31 DIAGNOSIS — E861 Hypovolemia: Secondary | ICD-10-CM | POA: Diagnosis not present

## 2020-07-31 DIAGNOSIS — L97929 Non-pressure chronic ulcer of unspecified part of left lower leg with unspecified severity: Secondary | ICD-10-CM | POA: Diagnosis present

## 2020-07-31 DIAGNOSIS — R0902 Hypoxemia: Secondary | ICD-10-CM | POA: Diagnosis not present

## 2020-07-31 DIAGNOSIS — L0390 Cellulitis, unspecified: Secondary | ICD-10-CM | POA: Diagnosis not present

## 2020-07-31 DIAGNOSIS — G546 Phantom limb syndrome with pain: Secondary | ICD-10-CM | POA: Diagnosis not present

## 2020-07-31 DIAGNOSIS — Z5902 Unsheltered homelessness: Secondary | ICD-10-CM | POA: Diagnosis not present

## 2020-07-31 DIAGNOSIS — I70262 Atherosclerosis of native arteries of extremities with gangrene, left leg: Secondary | ICD-10-CM | POA: Diagnosis not present

## 2020-07-31 DIAGNOSIS — E871 Hypo-osmolality and hyponatremia: Secondary | ICD-10-CM | POA: Diagnosis not present

## 2020-07-31 DIAGNOSIS — F10239 Alcohol dependence with withdrawal, unspecified: Secondary | ICD-10-CM | POA: Diagnosis present

## 2020-07-31 DIAGNOSIS — G4701 Insomnia due to medical condition: Secondary | ICD-10-CM | POA: Diagnosis not present

## 2020-07-31 DIAGNOSIS — M79605 Pain in left leg: Secondary | ICD-10-CM | POA: Diagnosis not present

## 2020-07-31 DIAGNOSIS — I96 Gangrene, not elsewhere classified: Secondary | ICD-10-CM | POA: Diagnosis not present

## 2020-07-31 DIAGNOSIS — E876 Hypokalemia: Secondary | ICD-10-CM | POA: Diagnosis not present

## 2020-07-31 DIAGNOSIS — Z89511 Acquired absence of right leg below knee: Secondary | ICD-10-CM

## 2020-07-31 LAB — COMPREHENSIVE METABOLIC PANEL
ALT: 14 U/L (ref 0–44)
AST: 26 U/L (ref 15–41)
Albumin: 2.3 g/dL — ABNORMAL LOW (ref 3.5–5.0)
Alkaline Phosphatase: 58 U/L (ref 38–126)
Anion gap: 11 (ref 5–15)
BUN: <5 mg/dL — ABNORMAL LOW (ref 6–20)
CO2: 28 mmol/L (ref 22–32)
Calcium: 8.3 mg/dL — ABNORMAL LOW (ref 8.9–10.3)
Chloride: 93 mmol/L — ABNORMAL LOW (ref 98–111)
Creatinine, Ser: 0.6 mg/dL — ABNORMAL LOW (ref 0.61–1.24)
GFR, Estimated: >60 mL/min (ref >60)
Glucose, Bld: 89 mg/dL (ref 70–99)
Potassium: 3.6 mmol/L (ref 3.5–5.1)
Sodium: 132 mmol/L — ABNORMAL LOW (ref 135–145)
Total Bilirubin: 0.4 mg/dL (ref 0.3–1.2)
Total Protein: 8.6 g/dL — ABNORMAL HIGH (ref 6.5–8.1)

## 2020-07-31 LAB — CBC WITH DIFFERENTIAL/PLATELET
Abs Immature Granulocytes: 0.02 10*3/uL (ref 0.00–0.07)
Basophils Absolute: 0.1 10*3/uL (ref 0.0–0.1)
Basophils Relative: 1 %
Eosinophils Absolute: 0 10*3/uL (ref 0.0–0.5)
Eosinophils Relative: 0 %
HCT: 32.2 % — ABNORMAL LOW (ref 39.0–52.0)
Hemoglobin: 10.5 g/dL — ABNORMAL LOW (ref 13.0–17.0)
Immature Granulocytes: 0 %
Lymphocytes Relative: 32 %
Lymphs Abs: 2.7 10*3/uL (ref 0.7–4.0)
MCH: 27.3 pg (ref 26.0–34.0)
MCHC: 32.6 g/dL (ref 30.0–36.0)
MCV: 83.9 fL (ref 80.0–100.0)
Monocytes Absolute: 0.7 10*3/uL (ref 0.1–1.0)
Monocytes Relative: 9 %
Neutro Abs: 5 10*3/uL (ref 1.7–7.7)
Neutrophils Relative %: 58 %
Platelets: 247 10*3/uL (ref 150–400)
RBC: 3.84 MIL/uL — ABNORMAL LOW (ref 4.22–5.81)
RDW: 14.7 % (ref 11.5–15.5)
WBC: 8.5 10*3/uL (ref 4.0–10.5)
nRBC: 0 % (ref 0.0–0.2)

## 2020-07-31 LAB — LIPID PANEL
Cholesterol: 105 mg/dL (ref 0–200)
HDL: 47 mg/dL (ref >40)
LDL Cholesterol: 51 mg/dL (ref 0–99)
Total CHOL/HDL Ratio: 2.2 RATIO
Triglycerides: 37 mg/dL (ref <150)
VLDL: 7 mg/dL (ref 0–40)

## 2020-07-31 LAB — LACTIC ACID, PLASMA
Lactic Acid, Venous: 1.6 mmol/L (ref 0.5–1.9)
Lactic Acid, Venous: 1.1 mmol/L (ref 0.5–1.9)

## 2020-07-31 LAB — RESP PANEL BY RT-PCR (FLU A&B, COVID) ARPGX2
Influenza A by PCR: NEGATIVE
Influenza B by PCR: NEGATIVE
SARS Coronavirus 2 by RT PCR: NEGATIVE

## 2020-07-31 LAB — ACETAMINOPHEN LEVEL: Acetaminophen (Tylenol), Serum: <10 ug/mL — ABNORMAL LOW (ref 10–30)

## 2020-07-31 LAB — HEMOGLOBIN A1C
Hgb A1c MFr Bld: 6.6 % — ABNORMAL HIGH (ref 4.8–5.6)
Mean Plasma Glucose: 142.72 mg/dL

## 2020-07-31 LAB — SALICYLATE LEVEL: Salicylate Lvl: <7 mg/dL — ABNORMAL LOW (ref 7.0–30.0)

## 2020-07-31 MED ORDER — ENOXAPARIN SODIUM 40 MG/0.4ML ~~LOC~~ SOLN
40.0000 mg | SUBCUTANEOUS | Status: DC
  Administered 2020-07-31 – 2020-08-03 (×4): 40 mg via SUBCUTANEOUS
  Filled 2020-07-31 (×3): qty 0.4

## 2020-07-31 MED ORDER — ONDANSETRON HCL 4 MG PO TABS: 4.0000 mg | ORAL_TABLET | Freq: Four times a day (QID) | ORAL | Status: DC | PRN

## 2020-07-31 MED ORDER — MORPHINE SULFATE (PF) 4 MG/ML IV SOLN
4.0000 mg | INTRAVENOUS | Status: DC | PRN
  Filled 2020-07-31: qty 1

## 2020-07-31 MED ORDER — THIAMINE HCL 100 MG/ML IJ SOLN
INTRAVENOUS | Status: DC
  Filled 2020-07-31 (×6): qty 1000

## 2020-07-31 MED ORDER — ONDANSETRON HCL 4 MG/2ML IJ SOLN
4.0000 mg | Freq: Four times a day (QID) | INTRAMUSCULAR | Status: DC | PRN
  Administered 2020-08-01 – 2020-08-02 (×2): 4 mg via INTRAVENOUS
  Filled 2020-07-31 (×2): qty 2

## 2020-07-31 MED ORDER — VANCOMYCIN HCL 1250 MG/250ML IV SOLN
1250.0000 mg | Freq: Once | INTRAVENOUS | Status: AC
  Administered 2020-07-31: 1250 mg via INTRAVENOUS
  Filled 2020-07-31: qty 250

## 2020-07-31 MED ORDER — VANCOMYCIN HCL 1250 MG/250ML IV SOLN
1250.0000 mg | Freq: Two times a day (BID) | INTRAVENOUS | Status: AC
  Administered 2020-08-01 – 2020-08-06 (×12): 1250 mg via INTRAVENOUS
  Filled 2020-07-31 (×15): qty 250

## 2020-07-31 MED ORDER — LORAZEPAM 1 MG PO TABS
1.0000 mg | ORAL_TABLET | ORAL | Status: AC | PRN
  Administered 2020-08-01: 3 mg via ORAL
  Administered 2020-08-01: 1 mg via ORAL
  Administered 2020-08-01: 2 mg via ORAL
  Administered 2020-08-01: 1 mg via ORAL
  Filled 2020-07-31: qty 3
  Filled 2020-07-31 (×2): qty 1
  Filled 2020-07-31: qty 2

## 2020-07-31 MED ORDER — KETOROLAC TROMETHAMINE 30 MG/ML IJ SOLN: 30.0000 mg | Freq: Four times a day (QID) | INTRAMUSCULAR | Status: DC | PRN

## 2020-07-31 MED ORDER — THIAMINE HCL 100 MG PO TABS
100.0000 mg | ORAL_TABLET | Freq: Every day | ORAL | Status: DC
  Administered 2020-08-01 – 2020-08-27 (×26): 100 mg via ORAL
  Filled 2020-07-31 (×28): qty 1

## 2020-07-31 MED ORDER — ACETAMINOPHEN 650 MG RE SUPP
650.0000 mg | Freq: Four times a day (QID) | RECTAL | Status: DC | PRN
  Filled 2020-07-31: qty 1

## 2020-07-31 MED ORDER — ACETAMINOPHEN 325 MG PO TABS
650.0000 mg | ORAL_TABLET | Freq: Four times a day (QID) | ORAL | Status: DC | PRN
  Administered 2020-08-04 – 2020-08-23 (×5): 650 mg via ORAL
  Filled 2020-07-31 (×5): qty 2

## 2020-07-31 MED ORDER — MORPHINE SULFATE (PF) 4 MG/ML IV SOLN
4.0000 mg | Freq: Once | INTRAVENOUS | Status: AC
  Administered 2020-07-31: 4 mg via INTRAVENOUS
  Filled 2020-07-31: qty 1

## 2020-07-31 MED ORDER — PIPERACILLIN-TAZOBACTAM 3.375 G IVPB
3.3750 g | Freq: Three times a day (TID) | INTRAVENOUS | Status: DC
  Administered 2020-07-31 – 2020-08-02 (×5): 3.375 g via INTRAVENOUS
  Filled 2020-07-31 (×5): qty 50

## 2020-07-31 MED ORDER — FOLIC ACID 1 MG PO TABS
1.0000 mg | ORAL_TABLET | Freq: Every day | ORAL | Status: DC
  Administered 2020-08-01 – 2020-08-27 (×27): 1 mg via ORAL
  Filled 2020-07-31 (×27): qty 1

## 2020-07-31 MED ORDER — ASPIRIN EC 81 MG PO TBEC
81.0000 mg | DELAYED_RELEASE_TABLET | Freq: Every day | ORAL | Status: DC
  Administered 2020-07-31 – 2020-08-04 (×5): 81 mg via ORAL
  Filled 2020-07-31 (×5): qty 1

## 2020-07-31 MED ORDER — ADULT MULTIVITAMIN W/MINERALS CH
1.0000 | ORAL_TABLET | Freq: Every day | ORAL | Status: DC
  Administered 2020-08-01 – 2020-08-27 (×27): 1 via ORAL
  Filled 2020-07-31 (×28): qty 1

## 2020-07-31 MED ORDER — KETOROLAC TROMETHAMINE 30 MG/ML IJ SOLN
30.0000 mg | Freq: Four times a day (QID) | INTRAMUSCULAR | Status: AC | PRN
  Administered 2020-08-01 – 2020-08-05 (×14): 30 mg via INTRAVENOUS
  Filled 2020-07-31 (×14): qty 1

## 2020-07-31 MED ORDER — LORAZEPAM 2 MG/ML IJ SOLN
1.0000 mg | INTRAMUSCULAR | Status: AC | PRN
Start: 2020-07-31 — End: 2020-08-03
  Administered 2020-07-31 (×2): 4 mg via INTRAVENOUS
  Administered 2020-08-01: 1 mg via INTRAVENOUS
  Administered 2020-08-02: 2 mg via INTRAVENOUS
  Administered 2020-08-02: 3 mg via INTRAVENOUS
  Administered 2020-08-02 – 2020-08-03 (×8): 2 mg via INTRAVENOUS
  Filled 2020-07-31 (×2): qty 1
  Filled 2020-07-31: qty 2
  Filled 2020-07-31 (×2): qty 1
  Filled 2020-07-31: qty 2
  Filled 2020-07-31 (×3): qty 1
  Filled 2020-07-31: qty 2
  Filled 2020-07-31 (×3): qty 1

## 2020-07-31 MED ORDER — PIPERACILLIN-TAZOBACTAM 3.375 G IVPB 30 MIN
3.3750 g | Freq: Once | INTRAVENOUS | Status: AC
  Administered 2020-07-31: 3.375 g via INTRAVENOUS
  Filled 2020-07-31: qty 50

## 2020-07-31 MED ORDER — HYDROMORPHONE HCL 1 MG/ML IJ SOLN
1.0000 mg | Freq: Once | INTRAMUSCULAR | Status: AC
  Administered 2020-07-31: 1 mg via INTRAVENOUS
  Filled 2020-07-31: qty 1

## 2020-07-31 MED ORDER — THIAMINE HCL 100 MG/ML IJ SOLN
100.0000 mg | Freq: Every day | INTRAMUSCULAR | Status: DC
  Administered 2020-08-03: 100 mg via INTRAVENOUS
  Filled 2020-07-31 (×2): qty 2

## 2020-07-31 NOTE — ED Notes (Addendum)
Pt attempted to get on provider rolling stool in an attempt to leave the ED.  Pt is aware that he is under and IVC hold but continues to attempt to leave, security notified and responded.   Staff able to assist pt back into bed in a sitting position. Pt willing to take more medication for pain and elevated CIWA

## 2020-07-31 NOTE — H&P (Addendum)
History and Physical    Jake Nguyen:[id number] DOB: 07-24-1965 DOA: 07/31/2020  PCP: Patient, No Pcp Per (Inactive) (Confirm with patient/family/NH records and if not entered, this has to be entered at [facility] point of entry) Patient coming from: Homeless  I have personally briefly reviewed patient's old medical records in [facility]  Chief Complaint: Left foot pain  HPI: Jake Nguyen is a 55 y.o. male with medical history significant of alcohol abuse, left foot gangrene, presented with worsening of the left foot pain.  Patient developed left foot gangrene about 1 month ago and came to [facility], there was plan for vascular surgery to evaluate the patient for further treatment however patient signed out AMA.  Patient has been homeless and living on the streets since.  He reported has been wheelchair-bound for last 2 months because of left foot pain and gangrene.  For last few days, the pain of the left foot has become unbearable, "the pain has been killing me" he could not eat and sleep because of the pain.  This morning he called his brother and started him and he went to kill himself because of left foot pain and brother called police and police arrived and called EMS.  Patient denies any fever chills, no pain on the right leg or foot.  Currently, patient denied any suicidal ideation. ED Course: Left foot gangrene-like changes, tachycardia no fever.  Lactic acid within normal limits.  WBC within normal limits.  Left shoulder x-ray showed gas gangrene soft tissue but no signs of osteomyelitis.  Review of Systems: As per HPI otherwise 14 point review of systems negative.    Past Medical History:  Diagnosis Date  . Alcoholism (HCC)     No past surgical history on file.   reports that he has been smoking cigarettes. He has been smoking about 0.50 packs per day. He has never used smokeless tobacco. He reports current alcohol use. No history on file for drug  use.  Allergies  Allergen Reactions  . Pork-Derived Products Other (See Comments)    Pt states he doesn't eat pork    Family History  Family history unknown: Yes     Prior to Admission medications   Medication Sig Start Date End Date Taking? Authorizing Provider  traZODone (DESYREL) 50 MG tablet Take 1 tablet (50 mg total) by mouth at bedtime. Patient not taking: Reported on 07/14/2020 11/12/19   Cora Collum, DO    Physical Exam: Vitals:   07/31/20 1029 07/31/20 1130 07/31/20 1200 07/31/20 1300  BP: 117/86 127/86 114/79   Pulse: (!) 138 (!) 128 (!) 128   Resp: 20 17 20    Temp: 98.6 F (37 C)     SpO2: 99% 99% 100%   Weight:    68.9 kg  Height:    6' (1.829 m)    Constitutional: NAD, calm, comfortable Vitals:   07/31/20 1029 07/31/20 1130 07/31/20 1200 07/31/20 1300  BP: 117/86 127/86 114/79   Pulse: (!) 138 (!) 128 (!) 128   Resp: 20 17 20    Temp: 98.6 F (37 C)     SpO2: 99% 99% 100%   Weight:    68.9 kg  Height:    6' (1.829 m)   Eyes: PERRL, lids and conjunctivae normal ENMT: Mucous membranes are dry. Posterior pharynx clear of any exudate or lesions.Normal dentition.  Neck: normal, supple, no masses, no thyromegaly Respiratory: clear to auscultation bilaterally, no wheezing, no crackles. Normal respiratory effort.  No accessory muscle use.  Cardiovascular: Regular rate and rhythm, no murmurs / rubs / gallops. No extremity edema. 2+ pedal pulses. No carotid bruits.  Abdomen: no tenderness, no masses palpated. No hepatosplenomegaly. Bowel sounds positive.  Musculoskeletal: no clubbing / cyanosis. No joint deformity upper and lower extremities. Good ROM, no contractures. Normal muscle tone.  Skin: Left foot gangrene acute injuries, left leg cool to touch below-the-knee compared to right side below-knee Neurologic: CN 2-12 grossly intact. Sensation intact, DTR normal. Strength 5/5 in all 4.  Psychiatric: Normal judgment and insight. Alert and oriented x 3. Normal  mood.       Labs on Admission: I have personally reviewed following labs and imaging studies  CBC: Recent Labs  Lab 07/31/20 1036  WBC 8.5  NEUTROABS 5.0  HGB 10.5*  HCT 32.2*  MCV 83.9  PLT 247   Basic Metabolic Panel: Recent Labs  Lab 07/31/20 1036  NA 132*  K 3.6  CL 93*  CO2 28  GLUCOSE 89  BUN <5*  CREATININE 0.60*  CALCIUM 8.3*   GFR: Estimated Creatinine Clearance: 105.3 mL/min (A) (by C-G formula based on SCr of 0.6 mg/dL (L)). Liver Function Tests: Recent Labs  Lab 07/31/20 1036  AST 26  ALT 14  ALKPHOS 58  BILITOT 0.4  PROT 8.6*  ALBUMIN 2.3*   No results for input(s): LIPASE, AMYLASE in the last 168 hours. No results for input(s): AMMONIA in the last 168 hours. Coagulation Profile: No results for input(s): INR, PROTIME in the last 168 hours. Cardiac Enzymes: No results for input(s): CKTOTAL, CKMB, CKMBINDEX, TROPONINI in the last 168 hours. BNP (last 3 results) No results for input(s): PROBNP in the last 8760 hours. HbA1C: No results for input(s): HGBA1C in the last 72 hours. CBG: No results for input(s): GLUCAP in the last 168 hours. Lipid Profile: No results for input(s): CHOL, HDL, LDLCALC, TRIG, CHOLHDL, LDLDIRECT in the last 72 hours. Thyroid Function Tests: No results for input(s): TSH, T4TOTAL, FREET4, T3FREE, THYROIDAB in the last 72 hours. Anemia Panel: No results for input(s): VITAMINB12, FOLATE, FERRITIN, TIBC, IRON, RETICCTPCT in the last 72 hours. Urine analysis:    Component Value Date/Time   COLORURINE YELLOW 05/20/2018 1458   APPEARANCEUR CLEAR 05/20/2018 1458   LABSPEC 1.024 05/20/2018 1458   PHURINE 6.0 05/20/2018 1458   GLUCOSEU NEGATIVE 05/20/2018 1458   HGBUR LARGE (A) 05/20/2018 1458   BILIRUBINUR NEGATIVE 05/20/2018 1458   KETONESUR 80 (A) 05/20/2018 1458   PROTEINUR >=300 (A) 05/20/2018 1458   UROBILINOGEN 0.2 08/02/2009 2144   NITRITE NEGATIVE 05/20/2018 1458   LEUKOCYTESUR NEGATIVE 05/20/2018 1458     Radiological Exams on Admission: DG Foot Complete Left  Result Date: 07/31/2020 CLINICAL DATA:  Gangrene. EXAM: LEFT FOOT - COMPLETE 3+ VIEW COMPARISON:  07/14/2020 FINDINGS: Extensive soft tissue gas is seen along the dorsal and plantar surface of the foot with associated soft tissue swelling. Negative for osteomyelitis or fracture. IMPRESSION: Progression of soft tissue gas compatible with infection. Negative for osteomyelitis. Electronically Signed   By: Marlan Palau M.D.   On: 07/31/2020 11:16    EKG: None  Assessment/Plan Active Problems:   Gangrene of foot (HCC)   Gangrene (HCC)  (please populate well all problems here in Problem List. (For example, if patient is on BP meds at home and you resume or decide to hold them, it is a problem that needs to be her. Same for CAD, COPD, HLD and so on)  Left foot dry gangrene -  Suspect underlying PVD, patient reported he does not have history of claudication before L>R. Now, left foot looks like unsalvageable, expect below-knee amputation.  Vascular surgeon consulted in ED.  There is signs of active infection going on in the left foot but x-ray and symptoms.  Given the worsening of left foot pain in the last few days, agreed with starting antibiotics to prevent sepsis. -Symptoms onset >1 month, no systemic anticoagulation indicated.  Left foot infection -As above.  Sinus tachycardia -Clinically looks hypovolemia/dehydrated, start IVF.  Question of PVD -Check ABI on right side -Vascular surgeon to follow -Check A1c and lipid panel  Alcohol abuse -No symptoms signs of active withdrawal, CIWA protocol with as needed benzos  Suicidal ideation -Currently denied any suicidal ideation, psychiatry we developed the patient.  DVT prophylaxis: Lovenox Code Status: Full Code Family Communication: none at bedside Disposition Plan: Expect OR time and at least BKA of left side and postop recovery, expect more than 2 midnight hospital  stay. Consults called: Vascular surgery Admission status: Telemetry admission expect downgrade within 24 hours to MedSurg   Emeline General MD [facility] Pager [phone]  07/31/2020, 1:14 PM

## 2020-07-31 NOTE — [facility] Counselor (Signed)
Patient is to be a medical admit. Will need psych consult rather than TTS. Not medically cleared for psych eval at this time.

## 2020-07-31 NOTE — ED Notes (Signed)
Attempted to call report to nurse receiving pt. Secretary states charge nurse has yet to check the chart on this pt and will call me back.

## 2020-07-31 NOTE — ED Notes (Signed)
Pt given crackers, peanut butter and water. 
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
## 2020-07-31 NOTE — Consult Note (Signed)
[facility] OF   ASSESSMENT / PLAN: 55 y.o. male with severe gangrene of entire left forefoot. Suspect severe infra-inguinal peripheral arterial disease based on physical exam. ABIs pending. Needs best medical therapy for PAD:  Recommend the following which can slow the progression of atherosclerosis and reduce the risk of major adverse cardiac / limb events:  Complete cessation from all tobacco products. Blood glucose control with goal A1c < 7%. Blood pressure control with goal blood pressure < 140/90 mmHg. Lipid reduction therapy with goal LDL-C <100 mg/dL (<14 if symptomatic from PAD).  Aspirin 81mg  PO QD.  Atorvastatin 40-80mg  PO QD (or other "high intensity" statin therapy).  Plan left lower extremity angiogram with possible intervention via right common femoral artery approach in cath lab next week.    CHIEF COMPLAINT: left foot gangrene  HISTORY OF PRESENT ILLNESS: Jake Nguyen is a 55 y.o. homeless, alcoholic male who presents to [place] ER for evaluation of left foot pain x 1 month. Patient seen in early April for the same at [facility] ER, but left AMA before vascular consultation could be done. On our evaluation today, the patient is somnolent but arousable. He will awaken briefly to stimulation, but cannot provide much history. All history is per chart review and discussion with hospital staff.   Past Medical History:  Diagnosis Date  . Alcoholism (HCC)    No past surgical history on file.  Family History  Family history unknown: Yes    Social History   Socioeconomic History  . Marital status: Single    Spouse name: Not on file  . Number of children: Not on file  . Years of education: Not on file  . Highest education level: Not on file  Occupational History  . Not on file  Tobacco Use  . Smoking status: Current Every Day Smoker    Packs/day: 0.50    Types: Cigarettes  . Smokeless tobacco: Never Used  Substance and Sexual  Activity  . Alcohol use: Yes  . Drug use: Not on file  . Sexual activity: Not Currently  Other Topics Concern  . Not on file  Social History Narrative  . Not on file   Social Determinants of Health   Financial Resource Strain: Not on file  Food Insecurity: Not on file  Transportation Needs: Not on file  Physical Activity: Not on file  Stress: Not on file  Social Connections: Not on file  Intimate Partner Violence: Not on file    Allergies  Allergen Reactions  . Pork-Derived Products Other (See Comments)    Pt states he doesn't eat pork    Current Facility-Administered Medications  Medication Dose Route Frequency Provider Last Rate Last Admin  . acetaminophen (TYLENOL) tablet 650 mg  650 mg Oral Q6H PRN [facility] T, MD       Or  . acetaminophen (TYLENOL) suppository 650 mg  650 mg Rectal Q6H PRN Mikey College T, MD      . enoxaparin (LOVENOX) injection 40 mg  40 mg Subcutaneous Q24H Mikey College T, MD      . Mikey College ON 08/01/2020] folic acid (FOLVITE) tablet 1 mg  1 mg Oral Daily 08/03/2020 T, MD      . ketorolac (TORADOL) 30 MG/ML injection 30 mg  30 mg Intravenous Q6H PRN Mikey College T, MD      . LORazepam (ATIVAN) tablet 1-4 mg  1-4 mg Oral Q1H PRN Mikey College, MD  Or  . LORazepam (ATIVAN) injection 1-4 mg  1-4 mg Intravenous Q1H PRN Mikey College T, MD   4 mg at 07/31/20 1413  . morphine 4 MG/ML injection 4 mg  4 mg Subcutaneous Q4H PRN Emeline General, MD      . Melene Muller ON 08/01/2020] multivitamin with minerals tablet 1 tablet  1 tablet Oral Daily Mikey College T, MD      . ondansetron [facility]) tablet 4 mg  4 mg Oral Q6H PRN Emeline General, MD       Or  . ondansetron [facility]) injection 4 mg  4 mg Intravenous Q6H PRN Mikey College T, MD      . piperacillin-tazobactam (ZOSYN) IVPB 3.375 g  3.375 g Intravenous Q8H Rumbarger, Rachel L, RPH      . sodium chloride 0.9 % 1,000 mL with thiamine 100 mg, folic acid 1 mg, multivitamins adult 10 mL infusion   Intravenous Continuous  Mikey College T, MD 150 mL/hr at 07/31/20 1437 New Bag at 07/31/20 1437  . [START ON 08/01/2020] thiamine tablet 100 mg  100 mg Oral Daily Mikey College T, MD       Or  . Melene Muller ON 08/01/2020] thiamine (B-1) injection 100 mg  100 mg Intravenous Daily Mikey College T, MD      . vancomycin (VANCOREADY) IVPB 1250 mg/250 mL  1,250 mg Intravenous Once Rumbarger, Faye Ramsay, RPH 166.7 mL/hr at 07/31/20 1439 1,250 mg at 07/31/20 1439  . [START ON 08/01/2020] vancomycin (VANCOREADY) IVPB 1250 mg/250 mL  1,250 mg Intravenous Q12H Rumbarger, Faye Ramsay, RPH       Current Outpatient Medications  Medication Sig Dispense Refill  . neomycin-bacitracin-polymyxin (NEOSPORIN) ointment Apply 1 application topically daily as needed for wound care.    . traZODone (DESYREL) 50 MG tablet Take 1 tablet (50 mg total) by mouth at bedtime. (Patient not taking: No sig reported) 30 tablet 0    REVIEW OF SYSTEMS:  Unable to obtain - somnolent  PHYSICAL EXAM  Vitals:   07/31/20 1130 07/31/20 1200 07/31/20 1300 07/31/20 1405  BP: 127/86 114/79 92/82   Pulse: (!) 128 (!) 128  (!) 110  Resp: 17 20 20    Temp:   98.8 F (37.1 C)   TempSrc:   Oral   SpO2: 99% 100%    Weight:   68.9 kg   Height:   6' (1.829 m)     Constitutional: Chronically ill appearing. Disheveled. Malodorous. Somnolent. In no distress. Appears marginally nourished.  Neurologic: not able to participate in neurologic exam. Psychiatric: not able to participate in neurologic exam.. Eyes: No icterus. No conjunctival pallor. Ears, nose, throat: mucous membranes moist. Midline trachea.  Cardiac: tachycardic.  Respiratory: unlabored. Abdominal: soft, non-tender, non-distended.  Peripheral vascular:  Severe left forefoot gangrene.  2+ femoral pulses  No popliteal pulses  2+ R DP  Absent pedal pulses on L  Absent doppler flow on L Extremity: No edema. No cyanosis. No pallor.  Skin:        Lymphatic: No Stemmer's sign. No palpable  lymphadenopathy.  PERTINENT LABORATORY AND RADIOLOGIC DATA  Most recent CBC CBC Latest Ref Rng & Units 07/31/2020 07/14/2020 11/12/2019  WBC 4.0 - 10.5 K/uL 8.5 7.4 6.3  Hemoglobin 13.0 - 17.0 g/dL 10.5(L) 10.7(L) 12.5(L)  Hematocrit 39.0 - 52.0 % 32.2(L) 32.5(L) 37.6(L)  Platelets 150 - 400 K/uL 247 210 244     Most recent CMP CMP Latest Ref Rng & Units 07/31/2020 07/14/2020 11/12/2019  Glucose 70 -  99 mg/dL 89 99 916(B)  BUN 6 - 20 mg/dL <8(G) 6 10  Creatinine 0.61 - 1.24 mg/dL 6.65(L) 9.35(T) 0.17  Sodium 135 - 145 mmol/L 132(L) 134(L) 134(L)  Potassium 3.5 - 5.1 mmol/L 3.6 3.5 4.1  Chloride 98 - 111 mmol/L 93(L) 97(L) 98  CO2 22 - 32 mmol/L 28 25 29   Calcium 8.9 - 10.3 mg/dL 8.3(L) 7.7(L) 9.0  Total Protein 6.5 - 8.1 g/dL ) 8.0 -  Total Bilirubin 0.3 - 1.2 mg/dL 0.4 0.5 -  Alkaline Phos 38 - 126 U/L 58 58 -  AST 15 - 41 U/L 26 19 -  ALT 0 - 44 U/L 14 14 -    Renal function Estimated Creatinine Clearance: 105.3 mL/min (A) (by C-G formula based on SCr of 0.6 mg/dL (L)).  Hgb A1c MFr Bld (%)  Date Value  05/23/2018 6.9 (H)    No results found for: LDLCALC, LDLC, HIRISKLDL, POCLDL, LDLDIRECT, REALLDLC, TOTLDLC   Trilby Way N. 07/22/2018, MD [facility] Phone Number: [phone] 07/31/2020 3:15 PM

## 2020-07-31 NOTE — ED Provider Notes (Signed)
[facility] EMERGENCY DEPARTMENT Provider Note   CSN: [id number] Arrival date & time: 07/31/20  1027     History Chief Complaint  Patient presents with  . Foot Injury    Jake Nguyen is a 55 y.o. male medical history significant for alcoholism, gangrene of left foot, rhabdomyolysis.  HPI Patient presents to emergency department today with chief complaint of foot injury.  Patient arrives via EMS.  Patient's brother took out IVC papers on him earlier this morning, [employer] are the ones that called EMS.  [employer] at the bedside states patient has been living on the street corner ever since recent hospital discharge.  Patient has made comments to his family that he hopes the infection in his foot will kill him.  Patient tells me he has severe pain in his left foot.  He describes it as throbbing sensation.  He states the pain is 9 out of 10 in severity.  He states that pain is located in his left ankle.  He states he has some feeling in his foot still however not much.  He is able to ambulate using a crutch.  He denies any purulent drainage from the wounds on his foot.  Denies any fever or chills.  Patient denies being suicidal.  He states he "does not want to have his foot cut off" which is the reason why he left the hospital last time.  Denies any homicidal ideations.  Patient admits to drinking alcohol daily.  He states he drank a 16 ounce beer this morning.  He denies any drug use.  Chart review shows patient seen in the ED on 07/14/2020 for left foot pain.  He was found to have gangrene of his left foot.  He was admitted to the hospital and vascular surgery was consulted and recommended likely amputation.  Patient ultimately left AMA that same day.  Past Medical History:  Diagnosis Date  . Alcoholism [facility])     Patient Active Problem List   Diagnosis Date Noted  . Gangrene of left foot (HCC) 07/14/2020  . Ataxia   . Alcohol abuse with alcohol-induced mood disorder (HCC)  11/10/2019  . Alcoholism /alcohol abuse   . Alcohol withdrawal syndrome, with delirium (HCC) 11/06/2019  . Pressure injury of skin 05/25/2018  . Rhabdomyolysis 05/20/2018  . Hyponatremia 05/20/2018  . Elevated troponin 05/20/2018  . Moderate dehydration 05/20/2018  . Alcohol use 05/20/2018  . Influenza A 05/20/2018  . Transaminitis 05/20/2018    No past surgical history on file.     Family History  Family history unknown: Yes    Social History   Tobacco Use  . Smoking status: Current Every Day Smoker    Packs/day: 0.50    Types: Cigarettes  . Smokeless tobacco: Never Used  Substance Use Topics  . Alcohol use: Yes    Home Medications Prior to Admission medications   Medication Sig Start Date End Date Taking? Authorizing Provider  traZODone (DESYREL) 50 MG tablet Take 1 tablet (50 mg total) by mouth at bedtime. Patient not taking: Reported on 07/14/2020 11/12/19   Cora Collum, DO    Allergies    Pork-derived products  Review of Systems   Review of Systems All other systems are reviewed and are negative for acute change except as noted in the HPI.  Physical Exam Updated Vital Signs BP 117/86   Pulse (!) 138   Temp 98.6 F (37 C)   Resp 20   SpO2 99%   Physical  Exam Vitals and nursing note reviewed.  Constitutional:      Appearance: He is well-developed. He is not ill-appearing or toxic-appearing.  HENT:     Head: Normocephalic and atraumatic.     Nose: Nose normal.  Eyes:     General: No scleral icterus.       Right eye: No discharge.        Left eye: No discharge.     Conjunctiva/sclera: Conjunctivae normal.  Neck:     Vascular: No JVD.  Cardiovascular:     Rate and Rhythm: Regular rhythm. Tachycardia present.     Pulses: Normal pulses.     Heart sounds: Normal heart sounds.  Pulmonary:     Effort: Pulmonary effort is normal.     Breath sounds: Normal breath sounds.  Abdominal:     General: There is no distension.  Musculoskeletal:         General: Normal range of motion.     Cervical back: Normal range of motion.  Feet:     Comments: Please see media below.  Patient has no sensation in his left toes.  Has tenderness palpation of plantar aspect of foot and left ankle.  Malodorous Skin:    General: Skin is warm and dry.  Neurological:     Mental Status: He is oriented to person, place, and time.     GCS: GCS eye subscore is 4. GCS verbal subscore is 5. GCS motor subscore is 6.     Comments: Fluent speech, no facial droop.  Psychiatric:        Behavior: Behavior normal.          ED Results / Procedures / Treatments   Labs (all labs ordered are listed, but only abnormal results are displayed) Labs Reviewed  CBC WITH DIFFERENTIAL/PLATELET - Abnormal; Notable for the following components:      Result Value   RBC 3.84 (*)    Hemoglobin 10.5 (*)    HCT 32.2 (*)    All other components within normal limits  COMPREHENSIVE METABOLIC PANEL - Abnormal; Notable for the following components:   Sodium 132 (*)    Chloride 93 (*)    BUN <5 (*)    Creatinine, Ser 0.60 (*)    Calcium 8.3 (*)    Total Protein 8.6 (*)    Albumin 2.3 (*)    All other components within normal limits  SALICYLATE LEVEL - Abnormal; Notable for the following components:   Salicylate Lvl <7.0 (*)    All other components within normal limits  ACETAMINOPHEN LEVEL - Abnormal; Notable for the following components:   Acetaminophen (Tylenol), Serum <10 (*)    All other components within normal limits  RESP PANEL BY RT-PCR (FLU A&B, COVID) ARPGX2  LACTIC ACID, PLASMA  LACTIC ACID, PLASMA    EKG None  Radiology DG Foot Complete Left  Result Date: 07/31/2020 CLINICAL DATA:  Gangrene. EXAM: LEFT FOOT - COMPLETE 3+ VIEW COMPARISON:  07/14/2020 FINDINGS: Extensive soft tissue gas is seen along the dorsal and plantar surface of the foot with associated soft tissue swelling. Negative for osteomyelitis or fracture. IMPRESSION: Progression of soft tissue  gas compatible with infection. Negative for osteomyelitis. Electronically Signed   By: Marlan Palau M.D.   On: 07/31/2020 11:16    Procedures Procedures   Medications Ordered in ED Medications  morphine 4 MG/ML injection 4 mg (4 mg Intravenous Given 07/31/20 1121)    ED Course  I have reviewed the triage vital signs  and the nursing notes.  Pertinent labs & imaging results that were available during my care of the patient were reviewed by me and considered in my medical decision making (see chart for details).    MDM Rules/Calculators/A&P                          History provided by patient with additional history obtained from chart review.    Patient here with left foot pain.  He has known gangrene to left foot.  Patient under IVC papers taken out by his brother.  On ED arrival patient is afebrile.  He is tachycardic to 138.  He is hemodynamically stable.  On exam left foot is gangrenous.  He has no sensation to touch on dorsal aspect of left midfoot extending to his toes.  He does have sensation to palpation of sole of left foot.  There is no purulent drainage.  Right ankle is swollen and erythematous.  Patient denies feeling suicidal or having any thoughts of self-harm.  He is adamant he does not want his foot to be amputated.  Had lengthy discussion with patient about treatment and how ultimately amputation is going to greatly improve his pain.  Patient given dose of morphine here.  He is agreeable to stay in the hospital for further evaluation of his foot pain.  With patient's passive suicidal comments made to family will have TTS evaluation there in the ED. Patient still under IVC until psych evaluation is complete. Pain medication ordered. First exam completed.  Labs show no leukocytosis, hemoglobin 10.5 is consistent with his baseline.  Normal platelets.  CMP with mild hyponatremia 132, low BUN/creatinine as well as low albumin likely in setting of severe malnutrition.  Lactic acid  is within normal range at 1.6.  Significant and salicylate levels are negative.  COVID test is negative.  X-ray of left foot shows progression of soft tissue gas compatible with infection. Negative for osteomyelitis.  I viewed imaging and agree with radiologist impression. Vancomycin ordered.   Consulted on call vascular surgeon and discussed case with RN as Dr. Lenell Antu is in the OR. She will let him know patient is being admitted to the hospital and vascular will see him in consult.  Spoke with Dr. Chipper Herb with hospitalist service who agrees to assume care of patient and bring into the hospital for further evaluation and management.  Findings and plan of care discussed with supervising physician Dr. Anitra Lauth who agrees with plan of care.   Portions of this note were generated with Scientist, clinical (histocompatibility and immunogenetics). Dictation errors may occur despite best attempts at proofreading.    Final Clinical Impression(s) / ED Diagnoses Final diagnoses:  Gangrene [facility])    Rx / DC Orders ED Discharge Orders    None       Kandice Hams 07/31/20 1409    Gwyneth Sprout, MD 08/01/20 [phone]

## 2020-07-31 NOTE — ED Notes (Signed)
Pt getting aggressive and restless. Pt yelling for help and demanding to be allow to get out of bed and move around the unit. Staff explained to pt that he needs to stay in his room for pt privacy and HIPPA   Pt offered option of chair or recliner instead of bed, pt refused, stating that he "needs to move and has to get of here"  Pt pulling off Vital sign monitoring

## 2020-07-31 NOTE — ED Notes (Signed)
Pt medicated per CIWA protocol orders, provider notified for CIWA of 20.

## 2020-07-31 NOTE — Progress Notes (Signed)
Pharmacy Antibiotic Note  Jake Nguyen is a 55 y.o. male admitted on 07/31/2020 with wound infection. Pharmacy has been consulted for vancomycin and zosyn dosing. Pt is afebrile and WBC is WNL. Scr is WNL and lactic acid is <2.   Plan: Vancomycin 1250mg  IV Q12H Zosyn 3.375gm IV Q8H (4 hr inf) F/u renal fxn, C&S, clinical status and peak/trough at SS  Height: 6' (182.9 cm) Weight: 68.9 kg (152 lb) IBW/kg (Calculated) : 77.6  Temp (24hrs), Avg:98.6 F (37 C), Min:98.6 F (37 C), Max:98.6 F (37 C)  Recent Labs  Lab 07/31/20 1036 07/31/20 1042  WBC 8.5  --   CREATININE 0.60*  --   LATICACIDVEN  --  1.6    Estimated Creatinine Clearance: 105.3 mL/min (A) (by C-G formula based on SCr of 0.6 mg/dL (L)).    Allergies  Allergen Reactions  . Pork-Derived Products Other (See Comments)    Pt states he doesn't eat pork    Antimicrobials this admission: Vanc 4/22>> Zosyn 4/22>>  Dose adjustments this admission: N/A  Microbiology results: Pending  Thank you for allowing pharmacy to be a part of this patient's care.  Neola Worrall, 08/02/20 07/31/2020 1:06 PM

## 2020-07-31 NOTE — ED Notes (Signed)
family updated at this time.

## 2020-07-31 NOTE — ED Notes (Signed)
Pt identified, resting in stretcher, vss on ccm, NADN. Sitter at bedside.

## 2020-08-01 ENCOUNTER — Encounter (HOSPITAL_COMMUNITY): Payer: Self-pay | Admitting: Internal Medicine

## 2020-08-01 LAB — CBC
HCT: 30.3 % — ABNORMAL LOW (ref 39.0–52.0)
Hemoglobin: 9.8 g/dL — ABNORMAL LOW (ref 13.0–17.0)
MCH: 27.1 pg (ref 26.0–34.0)
MCHC: 32.3 g/dL (ref 30.0–36.0)
MCV: 83.9 fL (ref 80.0–100.0)
Platelets: 211 10*3/uL (ref 150–400)
RBC: 3.61 MIL/uL — ABNORMAL LOW (ref 4.22–5.81)
RDW: 14.5 % (ref 11.5–15.5)
WBC: 6.9 10*3/uL (ref 4.0–10.5)
nRBC: 0 % (ref 0.0–0.2)

## 2020-08-01 MED ORDER — NALOXONE HCL 0.4 MG/ML IJ SOLN: 0.4000 mg | INTRAMUSCULAR | Status: DC | PRN

## 2020-08-01 MED ORDER — DIAZEPAM 5 MG PO TABS: 10.0000 mg | ORAL_TABLET | Freq: Three times a day (TID) | ORAL | Status: DC

## 2020-08-01 MED ORDER — SENNOSIDES-DOCUSATE SODIUM 8.6-50 MG PO TABS
2.0000 | ORAL_TABLET | Freq: Two times a day (BID) | ORAL | Status: DC
  Administered 2020-08-01 – 2020-08-25 (×18): 2 via ORAL
  Filled 2020-08-01 (×41): qty 2

## 2020-08-01 MED ORDER — TRAZODONE HCL 50 MG PO TABS
50.0000 mg | ORAL_TABLET | Freq: Every day | ORAL | Status: DC
  Administered 2020-08-01: 50 mg via ORAL
  Filled 2020-08-01: qty 1

## 2020-08-01 MED ORDER — NICOTINE 14 MG/24HR TD PT24
14.0000 mg | MEDICATED_PATCH | Freq: Every day | TRANSDERMAL | Status: DC
  Administered 2020-08-01 – 2020-08-02 (×2): 14 mg via TRANSDERMAL
  Filled 2020-08-01 (×2): qty 1

## 2020-08-01 MED ORDER — MORPHINE SULFATE (PF) 4 MG/ML IV SOLN
4.0000 mg | INTRAVENOUS | Status: DC | PRN
  Administered 2020-08-01 (×3): 4 mg via INTRAVENOUS
  Filled 2020-08-01 (×2): qty 1

## 2020-08-01 MED ORDER — KETOROLAC TROMETHAMINE 30 MG/ML IJ SOLN: 30.0000 mg | Freq: Once | INTRAMUSCULAR | Status: DC

## 2020-08-01 MED ORDER — DIAZEPAM 5 MG PO TABS
10.0000 mg | ORAL_TABLET | Freq: Three times a day (TID) | ORAL | Status: AC
  Administered 2020-08-01 (×2): 10 mg via ORAL
  Filled 2020-08-01 (×2): qty 2

## 2020-08-01 MED ORDER — DIAZEPAM 5 MG PO TABS
5.0000 mg | ORAL_TABLET | Freq: Two times a day (BID) | ORAL | Status: AC
  Administered 2020-08-04 – 2020-08-05 (×4): 5 mg via ORAL
  Filled 2020-08-01 (×4): qty 1

## 2020-08-01 MED ORDER — SODIUM CHLORIDE 0.9 % IV SOLN: INTRAVENOUS | Status: DC

## 2020-08-01 MED ORDER — DIAZEPAM 5 MG PO TABS
10.0000 mg | ORAL_TABLET | Freq: Two times a day (BID) | ORAL | Status: AC
  Administered 2020-08-02 – 2020-08-03 (×4): 10 mg via ORAL
  Filled 2020-08-01 (×4): qty 2

## 2020-08-01 NOTE — Progress Notes (Signed)
Entered diazepam taper per Dr. Marcelo Baldy instructions in the order placed.  Take 10 mg 3 times daily first day, then  Take 10 mg twice daily x2 days, then Take 5 mg twice daily x2 days, then DC.  Lamar Sprinkles, PharmD PGY1 Pharmacy Resident 08/01/2020 12:23 PM

## 2020-08-01 NOTE — Progress Notes (Signed)
[facility] [place] PROGRESS NOTE  ASSESSMENT / PLAN: 55 y.o. male with severe gangrene of entire left forefoot. Suspect severe infra-inguinal peripheral arterial disease based on physical exam. ABIs pending. Needs best medical therapy for PAD:  Recommend the following which can slow the progression of atherosclerosis and reduce the risk of major adverse cardiac / limb events:  Complete cessation from all tobacco products. Blood glucose control with goal A1c < 7%. Blood pressure control with goal blood pressure < 140/90 mmHg. Lipid reduction therapy with goal LDL-C <100 mg/dL (<83 if symptomatic from PAD).  Aspirin 81mg  PO QD.  Atorvastatin 40-80mg  PO QD (or other "high intensity" statin therapy).  Plan left lower extremity angiogram with possible intervention via right common femoral artery approach in cath lab Tuesday 08/04/20 if safe from a EtOH withdrawal perspective.    SUBJECTIVE: More lucid this AM. Reports drinking >1 pint a day. Explained my concerns for his foot, and high likelihood of some form of amputation.  OBJECTIVE: BP 122/88 (BP Location: Right Arm)   Pulse (!) 110   Temp 98.3 F (36.8 C) (Oral)   Resp 20   Ht 6' (1.829 m)   Wt 67.3 kg   SpO2 100%   BMI 20.12 kg/m   Intake/Output Summary (Last 24 hours) at 08/01/2020 1028 Last data filed at 08/01/2020 0748 Gross per 24 hour  Intake 2420.86 ml  Output 3025 ml  Net -604.14 ml    Somnolent, but awakens easily Unchanged appearance of left foot gangrene  CBC Latest Ref Rng & Units 08/01/2020 07/31/2020 07/14/2020  WBC 4.0 - 10.5 K/uL 6.9 8.5 7.4  Hemoglobin 13.0 - 17.0 g/dL 09/13/2020) 10.5(L) 10.7(L)  Hematocrit 39.0 - 52.0 % 30.3(L) 32.2(L) 32.5(L)  Platelets 150 - 400 K/uL 211 247 210     CMP Latest Ref Rng & Units 07/31/2020 07/14/2020 11/12/2019  Glucose 70 - 99 mg/dL 89 99 01/12/2020)  BUN 6 - 20 mg/dL 947(M) 6 10  Creatinine 0.61 - 1.24 mg/dL <5(Y) 6.50(P) 5.46(F  Sodium 135 - 145 mmol/L  132(L) 134(L) 134(L)  Potassium 3.5 - 5.1 mmol/L 3.6 3.5 4.1  Chloride 98 - 111 mmol/L 93(L) 97(L) 98  CO2 22 - 32 mmol/L 28 25 29   Calcium 8.9 - 10.3 mg/dL 8.3(L) 7.7(L) 9.0  Total Protein 6.5 - 8.1 g/dL 6.81) 8.0 -  Total Bilirubin 0.3 - 1.2 mg/dL 0.4 0.5 -  Alkaline Phos 38 - 126 U/L 58 58 -  AST 15 - 41 U/L 26 19 -  ALT 0 - 44 U/L 14 14 -    Estimated Creatinine Clearance: 102.8 mL/min (A) (by C-G formula based on SCr of 0.6 mg/dL (L)).  . 2.7(N, MD [facility] Phone Number: [phone] 08/01/2020 10:28 AM

## 2020-08-01 NOTE — Progress Notes (Signed)
PROGRESS NOTE  [name] INO:[id number] DOB: 05-10-1965 DOA: 07/31/2020 PCP: Patient, No Pcp Per (Inactive)  HPI/Recap of past 24 hours: Jake Nguyen is a 55 y.o. male with medical history significant of alcohol abuse, left foot gangrene, presented with worsening of the left foot pain.  Patient developed left foot gangrene about 1 month ago and came to [facility], there was plan for vascular surgery to evaluate the patient for further treatment however patient signed out AMA.  Patient has been homeless and living on the streets since.  He reported has been wheelchair-bound for last 2 months because of left foot pain and gangrene.  For last few days, the pain of the left foot has become unbearable, "the pain has been killing me" he could not eat and sleep because of the pain.  This morning he called his brother and told him he wanted to kill himself because of left foot pain.  Brother called police and police arrived and called EMS.  Patient denies any fever chills, no pain on the right leg or foot.  Currently, patient denied any suicidal ideation.  One-to-one sitter in place for patient's own safety.  Vascular surgery consulted.  Left foot x-ray showed gas gangrene soft tissue but no signs of osteomyelitis.  On IV antibiotics empirically.  08/01/20: Patient was seen and examined at his bedside.  He reports having some pain in his left lower extremity.  Cannot feel his toes.  Dry gangrene noted in his left foot.  Seen by vascular surgery with plan for possible intervention on Tuesday.   Assessment/Plan: Active Problems:   Gangrene of foot (HCC)   Gangrene (HCC)   Left foot dry gangrene with concern for superimposed infection, POA Suspected underlying PVD. Vascular surgery consulted, possible intervention on Tuesday Continue IV antibiotics empirically, IV vancomycin and Zosyn. Obtain blood cultures x2.  Follow-up Pain control in place with bowel regimen.    Suspected  peripheral vascular disease ABI  Vascular surgery  On daily aspirin 81 mg  Hypovolemic hyponatremia Presented with serum sodium 132 Continue gentle IV fluid hydration normal saline  Alcohol abuse with concern for alcohol withdrawal Reports daily alcohol use Continue CIWA protocol Continue multivitamin, thiamine and folic acid Alcohol cessation counseling  Suspected chronic blood loss in the setting of left fourth dry gangrene Shortly hemoglobin 9.8, baseline hemoglobin appears to be on 11/12/19. No overt bleeding Continue to monitor H&H  Sinus tachycardia Not hypoxic with O2 saturation 100% on room air Suspect secondary to hypovolemia Continue IV fluid restriction.   Closely monitor vital signs   Code Status: Full code.  Family Communication: None at bedside.  Disposition Plan: Likely will discharge once vascular surgery signs of.   Consultants:  Vascular surgery  Procedures:  None.  Antimicrobials:  IV vancomycin  IV Zosyn.  DVT prophylaxis: Subcu Lovenox daily  Status is: Inpatient    Dispo: The patient is from: Homeless              Anticipated d/c is to: Undetermined               Patient currently not appropriate for discharge due to ongoing management of left foot gangrene.   Difficult to place patient, not applicable        Objective: Vitals:   08/01/20 0300 08/01/20 0400 08/01/20 0747 08/01/20 1051  BP:  134/83 122/88 117/80  Pulse:  (!) 135 (!) 110 96  Resp: 20 14 20 18   Temp:  99.1 F (  37.3 C) 98.3 F (36.8 C) 97.8 F (36.6 C)  TempSrc:  Oral Oral Oral  SpO2: 98% 97% 100% 98%  Weight:      Height:        Intake/Output Summary (Last 24 hours) at 08/01/2020 1100 Last data filed at 08/01/2020 2992 Gross per 24 hour  Intake 2420.86 ml  Output 3025 ml  Net -604.14 ml   Filed Weights   07/31/20 1300 07/31/20 2135  Weight: 68.9 kg 67.3 kg    Exam:  . General: 55 y.o. year-old male well developed well nourished in no acute  distress.  Alert and oriented x3. . Cardiovascular: Tachycardic with no rubs or gallops.  No thyromegaly or JVD noted.   Marland Kitchen Respiratory: Clear to auscultation with no wheezes or rales. Good inspiratory effort. . Abdomen: Soft nontender nondistended with normal bowel sounds x4 quadrants. . Musculoskeletal: No lower extremity edema. Left foot gangrene . Skin:  Left foot gangrene . Psychiatry: Mood is appropriate for condition and setting   Data Reviewed: CBC: Recent Labs  Lab 07/31/20 1036 08/01/20 0224  WBC 8.5 6.9  NEUTROABS 5.0  --   HGB 10.5* 9.8*  HCT 32.2* 30.3*  MCV 83.9 83.9  PLT 247 211   Basic Metabolic Panel: Recent Labs  Lab 07/31/20 1036  NA 132*  K 3.6  CL 93*  CO2 28  GLUCOSE 89  BUN <5*  CREATININE 0.60*  CALCIUM 8.3*   GFR: Estimated Creatinine Clearance: 102.8 mL/min (A) (by C-G formula based on SCr of 0.6 mg/dL (L)). Liver Function Tests: Recent Labs  Lab 07/31/20 1036  AST 26  ALT 14  ALKPHOS 58  BILITOT 0.4  PROT 8.6*  ALBUMIN 2.3*   No results for input(s): LIPASE, AMYLASE in the last 168 hours. No results for input(s): AMMONIA in the last 168 hours. Coagulation Profile: No results for input(s): INR, PROTIME in the last 168 hours. Cardiac Enzymes: No results for input(s): CKTOTAL, CKMB, CKMBINDEX, TROPONINI in the last 168 hours. BNP (last 3 results) No results for input(s): PROBNP in the last 8760 hours. HbA1C: Recent Labs    07/31/20 1036  HGBA1C 6.6*   CBG: No results for input(s): GLUCAP in the last 168 hours. Lipid Profile: Recent Labs    07/31/20 2152  CHOL 105  HDL 47  LDLCALC 51  TRIG 37  CHOLHDL 2.2   Thyroid Function Tests: No results for input(s): TSH, T4TOTAL, FREET4, T3FREE, THYROIDAB in the last 72 hours. Anemia Panel: No results for input(s): VITAMINB12, FOLATE, FERRITIN, TIBC, IRON, RETICCTPCT in the last 72 hours. Urine analysis:    Component Value Date/Time   COLORURINE YELLOW 05/20/2018 1458    APPEARANCEUR CLEAR 05/20/2018 1458   LABSPEC 1.024 05/20/2018 1458   PHURINE 6.0 05/20/2018 1458   GLUCOSEU NEGATIVE 05/20/2018 1458   HGBUR LARGE (A) 05/20/2018 1458   BILIRUBINUR NEGATIVE 05/20/2018 1458   KETONESUR 80 (A) 05/20/2018 1458   PROTEINUR >=300 (A) 05/20/2018 1458   UROBILINOGEN 0.2 08/02/2009 2144   NITRITE NEGATIVE 05/20/2018 1458   LEUKOCYTESUR NEGATIVE 05/20/2018 1458   Sepsis Labs: @LABRCNTIP (procalcitonin:4,lacticidven:4)  ) Recent Results (from the past 240 hour(s))  Resp Panel by RT-PCR (Flu A&B, Covid) Nasopharyngeal Swab     Status: None   Collection Time: 07/31/20 10:36 AM   Specimen: Nasopharyngeal Swab; Nasopharyngeal(NP) swabs in vial transport medium  Result Value Ref Range Status   SARS Coronavirus 2 by RT PCR NEGATIVE NEGATIVE Final    Comment: (NOTE) SARS-CoV-2 target nucleic acids are  NOT DETECTED.  The SARS-CoV-2 RNA is generally detectable in upper respiratory specimens during the acute phase of infection. The lowest concentration of SARS-CoV-2 viral copies this assay can detect is 138 copies/mL. A negative result does not preclude SARS-Cov-2 infection and should not be used as the sole basis for treatment or other patient management decisions. A negative result may occur with  improper specimen collection/handling, submission of specimen other than nasopharyngeal swab, presence of viral mutation(s) within the areas targeted by this assay, and inadequate number of viral copies(<138 copies/mL). A negative result must be combined with clinical observations, patient history, and epidemiological information. The expected result is Negative.  Fact Sheet for Patients:  BloggerCourse.com  Fact Sheet for Healthcare Providers:  SeriousBroker.it  This test is no t yet approved or cleared by the [place] FDA and  has been authorized for detection and/or diagnosis of SARS-CoV-2 by FDA under an  Emergency Use Authorization (EUA). This EUA will remain  in effect (meaning this test can be used) for the duration of the COVID-19 declaration under Section 564(b)(1) of the Act, 21 U.S.C.section 360bbb-3(b)(1), unless the authorization is terminated  or revoked sooner.       Influenza A by PCR NEGATIVE NEGATIVE Final   Influenza B by PCR NEGATIVE NEGATIVE Final    Comment: (NOTE) The Xpert Xpress SARS-CoV-2/FLU/RSV plus assay is intended as an aid in the diagnosis of influenza from Nasopharyngeal swab specimens and should not be used as a sole basis for treatment. Nasal washings and aspirates are unacceptable for Xpert Xpress SARS-CoV-2/FLU/RSV testing.  Fact Sheet for Patients: BloggerCourse.com  Fact Sheet for Healthcare Providers: SeriousBroker.it  This test is not yet approved or cleared by the [place] FDA and has been authorized for detection and/or diagnosis of SARS-CoV-2 by FDA under an Emergency Use Authorization (EUA). This EUA will remain in effect (meaning this test can be used) for the duration of the COVID-19 declaration under Section 564(b)(1) of the Act, 21 U.S.C. section 360bbb-3(b)(1), unless the authorization is terminated or revoked.  Performed at [facility], [address], [place], [place] [address]       Studies: No results found.  Scheduled Meds: . aspirin EC  81 mg Oral Daily  . enoxaparin (LOVENOX) injection  40 mg Subcutaneous Q24H  . folic acid  1 mg Oral Daily  . multivitamin with minerals  1 tablet Oral Daily  . thiamine  100 mg Oral Daily   Or  . thiamine  100 mg Intravenous Daily    Continuous Infusions: . piperacillin-tazobactam (ZOSYN)  IV 3.375 g (08/01/20 0440)  . banana bag IV 1000 mL 150 mL/hr at 08/01/20 0614  . vancomycin 166.7 mL/hr at 08/01/20 3086     LOS: 1 day     Darlin Drop, MD [facility] Pager [phone]  If 7PM-7AM, please contact  night-coverage [email or web address] Password [email or web address] 08/01/2020, 11:00 AM

## 2020-08-02 DIAGNOSIS — F1014 Alcohol abuse with alcohol-induced mood disorder: Secondary | ICD-10-CM

## 2020-08-02 LAB — CBC
HCT: 29.4 % — ABNORMAL LOW (ref 39.0–52.0)
Hemoglobin: 9.3 g/dL — ABNORMAL LOW (ref 13.0–17.0)
MCH: 26.9 pg (ref 26.0–34.0)
MCHC: 31.6 g/dL (ref 30.0–36.0)
MCV: 85 fL (ref 80.0–100.0)
Platelets: 192 10*3/uL (ref 150–400)
RBC: 3.46 MIL/uL — ABNORMAL LOW (ref 4.22–5.81)
RDW: 14.6 % (ref 11.5–15.5)
WBC: 6.5 10*3/uL (ref 4.0–10.5)
nRBC: 0 % (ref 0.0–0.2)

## 2020-08-02 LAB — BASIC METABOLIC PANEL
Anion gap: 5 (ref 5–15)
BUN: <5 mg/dL — ABNORMAL LOW (ref 6–20)
CO2: 30 mmol/L (ref 22–32)
Calcium: 8 mg/dL — ABNORMAL LOW (ref 8.9–10.3)
Chloride: 97 mmol/L — ABNORMAL LOW (ref 98–111)
Creatinine, Ser: 0.55 mg/dL — ABNORMAL LOW (ref 0.61–1.24)
GFR, Estimated: >60 mL/min (ref >60)
Glucose, Bld: 99 mg/dL (ref 70–99)
Potassium: 3.2 mmol/L — ABNORMAL LOW (ref 3.5–5.1)
Sodium: 132 mmol/L — ABNORMAL LOW (ref 135–145)

## 2020-08-02 MED ORDER — POTASSIUM CHLORIDE CRYS ER 20 MEQ PO TBCR
40.0000 meq | EXTENDED_RELEASE_TABLET | Freq: Once | ORAL | Status: AC
  Administered 2020-08-02: 40 meq via ORAL
  Filled 2020-08-02: qty 2

## 2020-08-02 MED ORDER — GABAPENTIN 100 MG PO CAPS
100.0000 mg | ORAL_CAPSULE | Freq: Three times a day (TID) | ORAL | Status: DC | PRN
  Administered 2020-08-03 – 2020-08-08 (×9): 100 mg via ORAL
  Filled 2020-08-02 (×9): qty 1

## 2020-08-02 MED ORDER — SODIUM CHLORIDE 0.9 % IV SOLN
2.0000 g | Freq: Three times a day (TID) | INTRAVENOUS | Status: AC
  Administered 2020-08-02 – 2020-08-06 (×14): 2 g via INTRAVENOUS
  Filled 2020-08-02 (×14): qty 2

## 2020-08-02 MED ORDER — NICOTINE 21 MG/24HR TD PT24
21.0000 mg | MEDICATED_PATCH | Freq: Every day | TRANSDERMAL | Status: DC
  Administered 2020-08-03 – 2020-08-26 (×24): 21 mg via TRANSDERMAL
  Filled 2020-08-02 (×25): qty 1

## 2020-08-02 MED ORDER — QUETIAPINE FUMARATE 25 MG PO TABS
25.0000 mg | ORAL_TABLET | Freq: Every evening | ORAL | Status: DC | PRN
  Administered 2020-08-02 – 2020-08-25 (×21): 25 mg via ORAL
  Filled 2020-08-02 (×21): qty 1

## 2020-08-02 NOTE — Evaluation (Signed)
Physical Therapy Evaluation Patient Details Name: Jake Nguyen MRN: [id number] DOB: 08-19-1965 Today's Date: 08/02/2020   History of Present Illness  55 y.o. male presents to [facility] ED on 07/31/2020 with worsening L foot pain. Pt with history of L foot gangrene for ~1 month, there were plans for vascular surgery at [facility] however pt left AMA. PMH includes medical history significant of alcohol abuse, left foot gangrene.  Clinical Impression  Pt presents to PT with deficits in functional mobility, gait, balance, cognition, endurance, sensation, and with bilateral foot pain. PT evaluation limited by lethargy and confusion, increasing risk for falls at this time. Pt often forgetting he is in the hospital and requiring re-orientation. Pt demonstrates impaired balance in standing and reduced safety awareness in transfers which place him at a high risk for falls or injury. PT simulating NWB LLE during this session to prepare for possible LLE amputation as mentioned in vascular surgery note. PT recommends SNF placement at this time due to poor cognition, imbalance, and inability to identify caregiver support.    Follow Up Recommendations SNF (D/C recs will depend on possibility of amputation and WB status)    Equipment Recommendations  Rolling walker with 5" wheels    Recommendations for Other Services       Precautions / Restrictions Precautions Precautions: Fall Restrictions Weight Bearing Restrictions: No Other Position/Activity Restrictions: no WB restrictions currently, however PT treating pt as NWB LLE due to possible need for LLE amputation per vascular notes      Mobility  Bed Mobility               General bed mobility comments: not assessed, pt received in sitting at edge of bed and left sitting in recliner    Transfers Overall transfer level: Needs assistance Equipment used: Rolling walker (2 wheeled);1 person hand held assist Transfers: Squat Pivot Transfers;Sit  to/from Stand Sit to Stand: Min assist   Squat pivot transfers: Min assist     General transfer comment: pt first performs squat pivot transfer from bed to recliner, needing PT assist to ensure pt's buttocks makes it to recliner seat as pt nearly stops transfer short. Pt with some WB through L foot during this attempt. Pt is then able to perform sit to stand transfer with RW, maintaining NWB LLE, with PT assist due to L lateral lean  Ambulation/Gait Ambulation/Gait assistance:  (deferred 2/2 AMS and fatigue)              Stairs            Wheelchair Mobility    Modified Rankin (Stroke Patients Only)       Balance Overall balance assessment: Needs assistance Sitting-balance support: No upper extremity supported;Feet supported Sitting balance-Leahy Scale: Good     Standing balance support: Bilateral upper extremity supported Standing balance-Leahy Scale: Poor Standing balance comment: reliant on minA and BUE support of walker                             Pertinent Vitals/Pain Pain Assessment: Faces Faces Pain Scale: Hurts whole lot Pain Location: bilateral feet to touch Pain Descriptors / Indicators: Moaning Pain Intervention(s): Monitored during session    Home Living Family/patient expects to be discharged to:: Private residence Living Arrangements: Alone Available Help at Discharge:  (pt is unable to identify, brother has been present during hospitalization however not present during PT eval) Type of Home: House Home Access: Stairs to enter  Entrance Stairs-Rails:  (unable to report) Entrance Stairs-Number of Steps: 3 Home Layout: One level Home Equipment: Wheelchair - manual Additional Comments: pt is confsed during session and likely an unreliable historian. Per chart pt has been homeless prior to admission    Prior Function Level of Independence: Independent with assistive device(s)         Comments: pt reports independence with  transfers to wheelchair, PLOF will need to be confirmed with family or friends as pt is confused     Hand Dominance        Extremity/Trunk Assessment   Upper Extremity Assessment Upper Extremity Assessment: Overall WFL for tasks assessed    Lower Extremity Assessment Lower Extremity Assessment: RLE deficits/detail;LLE deficits/detail RLE Deficits / Details: ROM WFL, RLE strenght WFL LLE Deficits / Details: ROM WFL, generalized weakness noted, difficult to assess sensation due to cogitive deficits    Cervical / Trunk Assessment Cervical / Trunk Assessment: Normal  Communication   Communication: Other (comment) (dysarthric, garbled speech, difficult to understand)  Cognition Arousal/Alertness: Lethargic (intermittently falling asleep during history) Behavior During Therapy: Flat affect Overall Cognitive Status: No family/caregiver present to determine baseline cognitive functioning                                 General Comments: pt is disoriented to place, situation, requires increased time to follow commands, demonstrates reduced awareness of deficits and safety.      General Comments General comments (skin integrity, edema, etc.): VSS on RA    Exercises     Assessment/Plan    PT Assessment Patient needs continued PT services  PT Problem List Decreased strength;Decreased activity tolerance;Decreased balance;Decreased mobility;Decreased cognition;Decreased knowledge of use of DME;Decreased safety awareness;Decreased knowledge of precautions;Pain;Impaired sensation       PT Treatment Interventions DME instruction;Gait training;Stair training;Functional mobility training;Therapeutic activities;Therapeutic exercise;Balance training;Neuromuscular re-education;Cognitive remediation;Patient/family education;Wheelchair mobility training    PT Goals (Current goals can be found in the Care Plan section)  Acute Rehab PT Goals Patient Stated Goal: pt goal to go  outside, PT goal to improve balance and transfer quality PT Goal Formulation: With patient Time For Goal Achievement: 08/16/20 Potential to Achieve Goals: Fair Additional Goals Additional Goal #1: Pt will mobilize in manual wheelchair for >250' utilizing UEs and RLE    Frequency Min 3X/week   Barriers to discharge Decreased caregiver support      Co-evaluation               AM-PAC PT "6 Clicks" Mobility  Outcome Measure Help needed turning from your back to your side while in a flat bed without using bedrails?: A Little Help needed moving from lying on your back to sitting on the side of a flat bed without using bedrails?: A Little Help needed moving to and from a bed to a chair (including a wheelchair)?: A Little Help needed standing up from a chair using your arms (e.g., wheelchair or bedside chair)?: A Little Help needed to walk in hospital room?: A Lot Help needed climbing 3-5 steps with a railing? : A Lot 6 Click Score: 16    End of Session   Activity Tolerance: Patient limited by lethargy Patient left: in chair;with call bell/phone within reach;with nursing/sitter in room Nurse Communication: Mobility status PT Visit Diagnosis: Unsteadiness on feet (R26.81);Other abnormalities of gait and mobility (R26.89);Muscle weakness (generalized) (M62.81);Pain Pain - Right/Left: Left Pain - part of body: Ankle and joints of  foot    Time: 1027-2536 PT Time Calculation (min) (ACUTE ONLY): 17 min   Charges:   PT Evaluation $PT Eval Moderate Complexity: 1 Mod          Arlyss Gandy, PT, DPT Acute Rehabilitation Pager: [phone]   Arlyss Gandy 08/02/2020, 3:36 PM

## 2020-08-02 NOTE — Progress Notes (Addendum)
PROGRESS NOTE  Jake AlbeeDwight L Lightcap ZOX:[id number] DOB: 02/10/1966 DOA: 07/31/2020 PCP: Patient, No Pcp Per (Inactive)  HPI/Recap of past 24 hours: Jake Nguyen is a 55 y.o. male with medical history significant of alcohol abuse, left foot gangrene, presented with worsening of the left foot pain.  Patient developed left foot gangrene about 1 month ago and [facility], there was plan for vascular surgery to evaluate the patient for further treatment however patient signed out AMA.  Patient has been homeless and living on the streets since.  He reported has been wheelchair-bound for last 2 months because of left foot pain and gangrene.  For last few days, the pain of the left foot has become unbearable, "the pain has been killing me" he could not eat and sleep because of the pain.  This morning he called his brother and told him he wanted to kill himself because of left foot pain.  Brother called police and police arrived and called EMS.  Patient denies any fever chills, no pain on the right leg or foot.  Currently, patient denied any suicidal ideation.  One-to-one sitter in place for patient's own safety.  Vascular surgery consulted.  Left foot x-ray showed gas gangrene soft tissue but no signs of osteomyelitis.  On IV antibiotics empirically.  Seen by vascular surgery with plan for possible intervention on Tuesday.  08/02/20: Seen and examined at his bedside.  He denies having any suicidal or homicidal ideation.  Denies having any pain at the time of this visit.   Assessment/Plan: Active Problems:   Alcohol abuse with alcohol-induced mood disorder (HCC)   Gangrene of foot (HCC)   Gangrene (HCC)   Left foot dry gangrene with concern for superimposed infection, POA Suspected underlying PVD. Vascular surgery consulted, possible intervention on Tuesday Continue IV antibiotics empirically, IV vancomycin and Zosyn.   Follow-up blood cultures x2.  Pain control in place with bowel  regimen.    Suspected peripheral vascular disease ABI  Vascular surgery  On daily aspirin 81 mg  Hypokalemia Repleted Repeat levels in the morning  Hypovolemic hyponatremia Presented with serum sodium 132 Continue gentle IV fluid hydration normal saline  Alcohol abuse with concern for alcohol withdrawal Reports daily alcohol use Continue CIWA protocol Continue multivitamin, thiamine and folic acid Added Valium with taper. No evidence of alcohol withdrawal at the time of this visit. Alcohol cessation counseling  Suspected chronic blood loss in the setting of left fourth dry gangrene Shortly hemoglobin 9.8, baseline hemoglobin appears to be on 11/12/19. No overt bleeding Continue to monitor H&H  Improved sinus tachycardia likely secondary to volume depletion. Not hypoxic with O2 saturation 100% on room air Suspect secondary to hypovolemia Continue IV fluid restriction.   Closely monitor vital signs  Tobacco use disorder Patient states he is having a bad nicotine withdrawal. Started nicotine patch 21 mg daily.  Code Status: Full code.  Family Communication: None at bedside.  Disposition Plan: Likely will discharge once vascular surgery signs of.   Consultants:  Vascular surgery  Psychiatry  Procedures:  None.  Antimicrobials:  IV vancomycin  IV Zosyn.  DVT prophylaxis: Subcu Lovenox daily  Status is: Inpatient    Dispo: The patient is from: Homeless              Anticipated d/c is to: Undetermined               Patient currently not appropriate for discharge due to ongoing management of left foot  gangrene.   Difficult to place patient, not applicable        Objective: Vitals:   08/02/20 0307 08/02/20 0808 08/02/20 1219 08/02/20 1226  BP: 116/78 117/82 114/81 114/81  Pulse: 84 89 87 87  Resp: 17 18  17   Temp: 98.2 F (36.8 C) 98.1 F (36.7 C)  97.6 F (36.4 C)  TempSrc: Oral Oral  Oral  SpO2: 96% 100%  100%  Weight:      Height:         Intake/Output Summary (Last 24 hours) at 08/02/2020 1353 Last data filed at 08/02/2020 1010 Gross per 24 hour  Intake 600 ml  Output 2000 ml  Net -1400 ml   Filed Weights   07/31/20 1300 07/31/20 2135  Weight: 68.9 kg 67.3 kg    Exam:  . General: 55 y.o. year-old male chronically ill-appearing in no acute distress.  He is alert oriented x3.   . Cardiovascular: Regular rate and rhythm no rubs or gallops.   44 Respiratory: Clear to auscultation no wheezes or rales.   . Abdomen: Soft nontender normal bowel sounds present.   . Musculoskeletal: No lower extremity edema bilaterally.  Left foot gangrene noted. . Skin: Left foot gangrene. Marland Kitchen Psychiatry: Mood is appropriate for condition setting.   Data Reviewed: CBC: Recent Labs  Lab 07/31/20 1036 08/01/20 0224 08/02/20 0105  WBC 8.5 6.9 6.5  NEUTROABS 5.0  --   --   HGB 10.5* 9.8* 9.3*  HCT 32.2* 30.3* 29.4*  MCV 83.9 83.9 85.0  PLT 247 211 192   Basic Metabolic Panel: Recent Labs  Lab 07/31/20 1036 08/02/20 0105  NA 132* 132*  K 3.6 3.2*  CL 93* 97*  CO2 28 30  GLUCOSE 89 99  BUN <5* <5*  CREATININE 0.60* 0.55*  CALCIUM 8.3* 8.0*   GFR: Estimated Creatinine Clearance: 102.8 mL/min (A) (by C-G formula based on SCr of 0.55 mg/dL (L)). Liver Function Tests: Recent Labs  Lab 07/31/20 1036  AST 26  ALT 14  ALKPHOS 58  BILITOT 0.4  PROT 8.6*  ALBUMIN 2.3*   No results for input(s): LIPASE, AMYLASE in the last 168 hours. No results for input(s): AMMONIA in the last 168 hours. Coagulation Profile: No results for input(s): INR, PROTIME in the last 168 hours. Cardiac Enzymes: No results for input(s): CKTOTAL, CKMB, CKMBINDEX, TROPONINI in the last 168 hours. BNP (last 3 results) No results for input(s): PROBNP in the last 8760 hours. HbA1C: Recent Labs    07/31/20 1036  HGBA1C 6.6*   CBG: No results for input(s): GLUCAP in the last 168 hours. Lipid Profile: Recent Labs    07/31/20 2152  CHOL 105   HDL 47  LDLCALC 51  TRIG 37  CHOLHDL 2.2   Thyroid Function Tests: No results for input(s): TSH, T4TOTAL, FREET4, T3FREE, THYROIDAB in the last 72 hours. Anemia Panel: No results for input(s): VITAMINB12, FOLATE, FERRITIN, TIBC, IRON, RETICCTPCT in the last 72 hours. Urine analysis:    Component Value Date/Time   COLORURINE YELLOW 05/20/2018 1458   APPEARANCEUR CLEAR 05/20/2018 1458   LABSPEC 1.024 05/20/2018 1458   PHURINE 6.0 05/20/2018 1458   GLUCOSEU NEGATIVE 05/20/2018 1458   HGBUR LARGE (A) 05/20/2018 1458   BILIRUBINUR NEGATIVE 05/20/2018 1458   KETONESUR 80 (A) 05/20/2018 1458   PROTEINUR >=300 (A) 05/20/2018 1458   UROBILINOGEN 0.2 08/02/2009 2144   NITRITE NEGATIVE 05/20/2018 1458   LEUKOCYTESUR NEGATIVE 05/20/2018 1458   Sepsis Labs: @LABRCNTIP (procalcitonin:4,lacticidven:4)  ) Recent Results (  from the past 240 hour(s))  Resp Panel by RT-PCR (Flu A&B, Covid) Nasopharyngeal Swab     Status: None   Collection Time: 07/31/20 10:36 AM   Specimen: Nasopharyngeal Swab; Nasopharyngeal(NP) swabs in vial transport medium  Result Value Ref Range Status   SARS Coronavirus 2 by RT PCR NEGATIVE NEGATIVE Final    Comment: (NOTE) SARS-CoV-2 target nucleic acids are NOT DETECTED.  The SARS-CoV-2 RNA is generally detectable in upper respiratory specimens during the acute phase of infection. The lowest concentration of SARS-CoV-2 viral copies this assay can detect is 138 copies/mL. A negative result does not preclude SARS-Cov-2 infection and should not be used as the sole basis for treatment or other patient management decisions. A negative result may occur with  improper specimen collection/handling, submission of specimen other than nasopharyngeal swab, presence of viral mutation(s) within the areas targeted by this assay, and inadequate number of viral copies(<138 copies/mL). A negative result must be combined with clinical observations, patient history, and  epidemiological information. The expected result is Negative.  Fact Sheet for Patients:  BloggerCourse.com  Fact Sheet for Healthcare Providers:  SeriousBroker.it  This test is no t yet approved or cleared by the [place] FDA and  has been authorized for detection and/or diagnosis of SARS-CoV-2 by FDA under an Emergency Use Authorization (EUA). This EUA will remain  in effect (meaning this test can be used) for the duration of the COVID-19 declaration under Section 564(b)(1) of the Act, 21 U.S.C.section 360bbb-3(b)(1), unless the authorization is terminated  or revoked sooner.       Influenza A by PCR NEGATIVE NEGATIVE Final   Influenza B by PCR NEGATIVE NEGATIVE Final    Comment: (NOTE) The Xpert Xpress SARS-CoV-2/FLU/RSV plus assay is intended as an aid in the diagnosis of influenza from Nasopharyngeal swab specimens and should not be used as a sole basis for treatment. Nasal washings and aspirates are unacceptable for Xpert Xpress SARS-CoV-2/FLU/RSV testing.  Fact Sheet for Patients: BloggerCourse.com  Fact Sheet for Healthcare Providers: SeriousBroker.it  This test is not yet approved or cleared by the [place] FDA and has been authorized for detection and/or diagnosis of SARS-CoV-2 by FDA under an Emergency Use Authorization (EUA). This EUA will remain in effect (meaning this test can be used) for the duration of the COVID-19 declaration under Section 564(b)(1) of the Act, 21 U.S.C. section 360bbb-3(b)(1), unless the authorization is terminated or revoked.  Performed at [facility], [address] [place], [place] [address]   Culture, blood (routine x 2)     Status: None (Preliminary result)   Collection Time: 08/01/20 11:57 AM   Specimen: BLOOD  Result Value Ref Range Status   Specimen Description BLOOD RIGHT ANTECUBITAL  Final   Special Requests    Final    BOTTLES DRAWN AEROBIC AND ANAEROBIC Blood Culture adequate volume   Culture   Final    NO GROWTH < 24 HOURS Performed at [facility], [address], [place], [place] [address]    Report Status PENDING  Incomplete  Culture, blood (routine x 2)     Status: None (Preliminary result)   Collection Time: 08/01/20 12:04 PM   Specimen: BLOOD LEFT HAND  Result Value Ref Range Status   Specimen Description BLOOD LEFT HAND  Final   Special Requests   Final    BOTTLES DRAWN AEROBIC AND ANAEROBIC Blood Culture results may not be optimal due to an inadequate volume of blood received in culture bottles  Culture   Final    NO GROWTH < 24 HOURS Performed at [facility], [address] [place], [place] [address]    Report Status PENDING  Incomplete      Studies: No results found.  Scheduled Meds: . aspirin EC  81 mg Oral Daily  . diazepam  10 mg Oral BID   Followed by  . [START ON 08/04/2020] diazepam  5 mg Oral BID  . enoxaparin (LOVENOX) injection  40 mg Subcutaneous Q24H  . folic acid  1 mg Oral Daily  . ketorolac  30 mg Intravenous Once  . multivitamin with minerals  1 tablet Oral Daily  . [START ON 08/03/2020] nicotine  21 mg Transdermal Daily  . senna-docusate  2 tablet Oral BID  . thiamine  100 mg Oral Daily   Or  . thiamine  100 mg Intravenous Daily    Continuous Infusions: . sodium chloride 75 mL/hr at 08/02/20 1102  . ceFEPime (MAXIPIME) IV    . vancomycin 1,250 mg (08/02/20 0205)     LOS: 2 days     Darlin Drop, MD [facility] Pager [phone]  If 7PM-7AM, please contact night-coverage [email or web address] Password [email or web address] 08/02/2020, 1:53 PM

## 2020-08-02 NOTE — Progress Notes (Signed)
Pharmacy Antibiotic Note  Jake Nguyen is a 55 y.o. male admitted on 07/31/2020 with wound infection. Pharmacy has been consulted for vancomycin and cefepime dosing. Pt is afebrile and WBC is WNL. VVS consulted. Scr is WNL and lactic acid is <2.   Will discontinue Zosyn and start cefepime 2g q8hr in order to reduce the potential risk for concomitant nephrotoxicity when combining Zosyn and vanc. Vanc SS levels ordered for tomorrow.  Plan: Discontinue Zosyn Start cefepime 2g q8hr Continue vancomycin 1250mg  IV Q12H F/u renal fxn, C&S, clinical status and peak/trough at SS  Height: 6' (182.9 cm) Weight: 67.3 kg (148 lb 5.9 oz) IBW/kg (Calculated) : 77.6  Temp (24hrs), Avg:98.1 F (36.7 C), Min:98 F (36.7 C), Max:98.2 F (36.8 C)  Recent Labs  Lab 07/31/20 1036 07/31/20 1042 07/31/20 2152 08/01/20 0224 08/02/20 0105  WBC 8.5  --   --  6.9 6.5  CREATININE 0.60*  --   --   --  0.55*  LATICACIDVEN  --  1.6 1.1  --   --     Estimated Creatinine Clearance: 102.8 mL/min (A) (by C-G formula based on SCr of 0.55 mg/dL (L)).    Allergies  Allergen Reactions  . Pork-Derived Products Other (See Comments)    Pt states he doesn't eat pork   Thank you for allowing pharmacy to be a part of this patient's care.  08/04/20, PharmD PGY2 ID Pharmacy Resident Phone between 7 am - 3:30 pm: Margarite Gouge  Please check AMION for all [facility] Pharmacy phone numbers After 10:00 PM, call Main Pharmacy [phone]  08/02/2020 11:43 AM

## 2020-08-02 NOTE — Consult Note (Addendum)
[facility] Face-to-Face Psychiatry Consult   Reason for Consult:  Suicide comments Referring Physician:  Dr Margo Aye Patient Identification: Jake Nguyen MRN:  [id number] Principal Diagnosis: Alcohol abuse with alcohol induced mood disorder Diagnosis:  Active Problems:   Alcohol abuse with alcohol-induced mood disorder (HCC)   Gangrene of foot (HCC)   Gangrene (HCC)  Total Time spent with patient: 45 minutes  Subjective:   Jake Nguyen is a 55 y.o. male patient admitted with alcohol withdrawal and gangrene of left foot.  HPI:  55 yo male with alcohol use disorder.  Prior to admission he reports, "I came in with withdrawals from alcohol.  I was on a binge for a week."  He currently has some anxiety and tremors along with some cravings for alcohol.  Denies suicidal ideations "never that", denies past attempts or psychiatric hospitalizations (one admission for alcohol detox in 2011).  No homicidal ideations or hallucinations.  Appetite "been eating, good".  He would like something for sleep as his sleep is poor and for anxiety. The Trazodone did not work last night, started Seroquel 25 mg PRN sleep.  He is currently on a Valium and Ativan taper, started gabapentin 100 mg TID PRN which will also help prevent seizures.  Recommend alcohol rehab after discharge.  Past Psychiatric History: alcohol use disorder  Risk to Self:  none Risk to Others:  none Prior Inpatient Therapy:  2011, alcohol detox Prior Outpatient Therapy:  none  Past Medical History:  Past Medical History:  Diagnosis Date  . Alcoholism (HCC)    History reviewed. No pertinent surgical history. Family History:  Family History  Family history unknown: Yes   Family Psychiatric  History:  Alcoholism in his family Social History:  Social History   Substance and Sexual Activity  Alcohol Use Yes     Social History   Substance and Sexual Activity  Drug Use Not on file    Social History   Socioeconomic History  . Marital  status: Single    Spouse name: Not on file  . Number of children: Not on file  . Years of education: Not on file  . Highest education level: Not on file  Occupational History  . Not on file  Tobacco Use  . Smoking status: Current Every Day Smoker    Packs/day: 0.50    Types: Cigarettes  . Smokeless tobacco: Never Used  Substance and Sexual Activity  . Alcohol use: Yes  . Drug use: Not on file  . Sexual activity: Not Currently  Other Topics Concern  . Not on file  Social History Narrative  . Not on file   Social Determinants of Health   Financial Resource Strain: Not on file  Food Insecurity: Not on file  Transportation Needs: Not on file  Physical Activity: Not on file  Stress: Not on file  Social Connections: Not on file   Additional Social History:    Allergies:   Allergies  Allergen Reactions  . Pork-Derived Products Other (See Comments)    Pt states he doesn't eat pork    Labs:  Results for orders placed or performed during the hospital encounter of 07/31/20 (from the past 48 hour(s))  Lactic acid, plasma     Status: None   Collection Time: 07/31/20  9:52 PM  Result Value Ref Range   Lactic Acid, Venous 1.1 0.5 - 1.9 mmol/L    Comment: Performed at [facility], [address], [place], [place] [address]  Lipid panel  Status: None   Collection Time: 07/31/20  9:52 PM  Result Value Ref Range   Cholesterol 105 0 - 200 mg/dL   Triglycerides 37 <409 mg/dL   HDL 47 >81 mg/dL   Total CHOL/HDL Ratio 2.2 RATIO   VLDL 7 0 - 40 mg/dL   LDL Cholesterol 51 0 - 99 mg/dL    Comment:        Total Cholesterol/HDL:CHD Risk Coronary Heart Disease Risk Table                     Men   Women  1/2 Average Risk   3.4   3.3  Average Risk       5.0   4.4  2 X Average Risk   9.6   7.1  3 X Average Risk  23.4   11.0        Use the calculated Patient Ratio above and the CHD Risk Table to determine the patient's CHD Risk.        ATP III CLASSIFICATION (LDL):   <100     mg/dL   Optimal  191-478  mg/dL   Near or Above                    Optimal  130-159  mg/dL   Borderline  295-621  mg/dL   High  >308     mg/dL   Very High Performed at [facility], [address] [place], [place] [address]   CBC     Status: Abnormal   Collection Time: 08/01/20  2:24 AM  Result Value Ref Range   WBC 6.9 4.0 - 10.5 K/uL   RBC 3.61 (L) 4.22 - 5.81 MIL/uL   Hemoglobin 9.8 (L) 13.0 - 17.0 g/dL   HCT 69.6 (L) 29.5 - 28.4 %   MCV 83.9 80.0 - 100.0 fL   MCH 27.1 26.0 - 34.0 pg   MCHC 32.3 30.0 - 36.0 g/dL   RDW 13.2 44.0 - 10.2 %   Platelets 211 150 - 400 K/uL   nRBC 0.0 0.0 - 0.2 %    Comment: Performed at [facility], [address], [place], [place] [address]  Culture, blood (routine x 2)     Status: None (Preliminary result)   Collection Time: 08/01/20 11:57 AM   Specimen: BLOOD  Result Value Ref Range   Specimen Description BLOOD RIGHT ANTECUBITAL    Special Requests      BOTTLES DRAWN AEROBIC AND ANAEROBIC Blood Culture adequate volume   Culture      NO GROWTH < 24 HOURS Performed at [facility], [address] [place], [place] [address]    Report Status PENDING   Culture, blood (routine x 2)     Status: None (Preliminary result)   Collection Time: 08/01/20 12:04 PM   Specimen: BLOOD LEFT HAND  Result Value Ref Range   Specimen Description BLOOD LEFT HAND    Special Requests      BOTTLES DRAWN AEROBIC AND ANAEROBIC Blood Culture results may not be optimal due to an inadequate volume of blood received in culture bottles   Culture      NO GROWTH < 24 HOURS Performed at [facility], [address], [place], [place] [address]    Report Status PENDING   Basic metabolic panel     Status: Abnormal   Collection Time: 08/02/20  1:05 AM  Result Value Ref Range  Sodium 132 (L) 135 - 145 mmol/L   Potassium 3.2 (L) 3.5 - 5.1 mmol/L   Chloride 97 (L) 98 - 111 mmol/L   CO2 30 22 - 32 mmol/L   Glucose, Bld 99 70 - 99 mg/dL     Comment: Glucose reference range applies only to samples taken after fasting for at least 8 hours.   BUN <5 (L) 6 - 20 mg/dL   Creatinine, Ser 8.41 (L) 0.61 - 1.24 mg/dL   Calcium 8.0 (L) 8.9 - 10.3 mg/dL   GFR, Estimated >32 >44 mL/min    Comment: (NOTE) Calculated using the CKD-EPI Creatinine Equation (2021)    Anion gap 5 5 - 15    Comment: Performed at [facility], [address] [place], [place] [address]  CBC     Status: Abnormal   Collection Time: 08/02/20  1:05 AM  Result Value Ref Range   WBC 6.5 4.0 - 10.5 K/uL   RBC 3.46 (L) 4.22 - 5.81 MIL/uL   Hemoglobin 9.3 (L) 13.0 - 17.0 g/dL   HCT 25.3 (L) 66.4 - 40.3 %   MCV 85.0 80.0 - 100.0 fL   MCH 26.9 26.0 - 34.0 pg   MCHC 31.6 30.0 - 36.0 g/dL   RDW 47.4 25.9 - 56.3 %   Platelets 192 150 - 400 K/uL   nRBC 0.0 0.0 - 0.2 %    Comment: Performed at [facility], [address] [place], [place] [address]    Current Facility-Administered Medications  Medication Dose Route Frequency Provider Last Rate Last Admin  . 0.9 %  sodium chloride infusion   Intravenous Continuous Darlin Drop, DO 75 mL/hr at 08/02/20 1102 New Bag at 08/02/20 1102  . acetaminophen (TYLENOL) tablet 650 mg  650 mg Oral Q6H PRN Mikey College T, MD       Or  . acetaminophen (TYLENOL) suppository 650 mg  650 mg Rectal Q6H PRN Mikey College T, MD      . aspirin EC tablet 81 mg  81 mg Oral Daily Mikey College T, MD   81 mg at 08/02/20 0846  . ceFEPIme (MAXIPIME) 2 g in sodium chloride 0.9 % 100 mL IVPB  2 g Intravenous Q8H Jennette Kettle, RPH      . diazepam (VALIUM) tablet 10 mg  10 mg Oral BID Dow Adolph N, DO   10 mg at 08/02/20 0845   Followed by  . [START ON 08/04/2020] diazepam (VALIUM) tablet 5 mg  5 mg Oral BID Hall, Carole N, DO      . enoxaparin (LOVENOX) injection 40 mg  40 mg Subcutaneous Q24H Mikey College T, MD   40 mg at 08/01/20 1724  . folic acid (FOLVITE) tablet 1 mg  1 mg Oral Daily Mikey College T, MD   1 mg at 08/02/20 0846  .  ketorolac (TORADOL) 30 MG/ML injection 30 mg  30 mg Intravenous Q6H PRN Mikey College T, MD   30 mg at 08/02/20 0847  . ketorolac (TORADOL) 30 MG/ML injection 30 mg  30 mg Intravenous Once Stockton, Enid Derry N, DO      . LORazepam (ATIVAN) tablet 1-4 mg  1-4 mg Oral Q1H PRN Emeline General, MD   3 mg at 08/01/20 1851   Or  . LORazepam (ATIVAN) injection 1-4 mg  1-4 mg Intravenous Q1H PRN Mikey College T, MD   2 mg at 08/02/20 1111  . multivitamin with minerals tablet 1 tablet  1  tablet Oral Daily Mikey College T, MD   1 tablet at 08/02/20 0846  . naloxone [facility]) injection 0.4 mg  0.4 mg Intravenous PRN Dow Adolph N, DO      . nicotine (NICODERM CQ - dosed in mg/24 hours) patch 14 mg  14 mg Transdermal Daily Dow Adolph N, DO   14 mg at 08/02/20 0846  . ondansetron (ZOFRAN) tablet 4 mg  4 mg Oral Q6H PRN Mikey College T, MD       Or  . ondansetron [facility]) injection 4 mg  4 mg Intravenous Q6H PRN Emeline General, MD   4 mg at 08/02/20 0847  . senna-docusate (Senokot-S) tablet 2 tablet  2 tablet Oral BID Dow Adolph N, DO   2 tablet at 08/02/20 0846  . thiamine tablet 100 mg  100 mg Oral Daily Mikey College T, MD   100 mg at 08/02/20 0845   Or  . thiamine (B-1) injection 100 mg  100 mg Intravenous Daily Mikey College T, MD      . traZODone (DESYREL) tablet 50 mg  50 mg Oral QHS Dow Adolph N, DO   50 mg at 08/01/20 2059  . vancomycin (VANCOREADY) IVPB 1250 mg/250 mL  1,250 mg Intravenous Q12H Rumbarger, Faye Ramsay, RPH 166.7 mL/hr at 08/02/20 0205 1,250 mg at 08/02/20 0205    Musculoskeletal: Strength & Muscle Tone: decreased Gait & Station: did not witness Patient leans: N/A  Psychiatric Specialty Exam: Physical Exam Vitals and nursing note reviewed.  Constitutional:      Appearance: Normal appearance.  HENT:     Head: Normocephalic.     Nose: Nose normal.  Pulmonary:     Effort: Pulmonary effort is normal.  Musculoskeletal:     Cervical back: Normal range of motion.  Neurological:     General: No  focal deficit present.     Mental Status: He is alert and oriented to person, place, and time.  Psychiatric:        Attention and Perception: Attention and perception normal.        Mood and Affect: Mood is anxious.        Speech: Speech normal.        Behavior: Behavior normal. Behavior is cooperative.        Thought Content: Thought content normal.        Cognition and Memory: Cognition and memory normal.        Judgment: Judgment normal.     Review of Systems  Musculoskeletal:       Left foot pain  Psychiatric/Behavioral: Positive for substance abuse. The patient is nervous/anxious.   All other systems reviewed and are negative.   Blood pressure 114/81, pulse 87, temperature 97.6 F (36.4 C), temperature source Oral, resp. rate 17, height 6' (1.829 m), weight 67.3 kg, SpO2 100 %.Body mass index is 20.12 kg/m.  General Appearance: Casual  Eye Contact:  Good  Speech:  Normal Rate  Volume:  Normal  Mood:  Anxious  Affect:  Congruent  Thought Process:  Coherent and Descriptions of Associations: Intact  Orientation:  Full (Time, Place, and Person)  Thought Content:  WDL and Logical  Suicidal Thoughts:  No  Homicidal Thoughts:  No  Memory:  Immediate;   Good Recent;   Good Remote;   Good  Judgement:  Fair  Insight:  Fair  Psychomotor Activity:  Decreased  Concentration:  Concentration: Fair and Attention Span: Fair  Recall:  Good  Fund of Knowledge:  Fair  Language:  Good  Akathisia:  No  Handed:  Right  AIMS (if indicated):     Assets:  Housing Leisure Time Resilience Social Support  ADL's:  Intact  Cognition:  WNL  Sleep:      Physical Exam: Physical Exam Vitals and nursing note reviewed.  Constitutional:      Appearance: Normal appearance.  HENT:     Head: Normocephalic.     Nose: Nose normal.  Pulmonary:     Effort: Pulmonary effort is normal.  Musculoskeletal:     Cervical back: Normal range of motion.  Neurological:     General: No focal deficit  present.     Mental Status: He is alert and oriented to person, place, and time.  Psychiatric:        Attention and Perception: Attention and perception normal.        Mood and Affect: Mood is anxious.        Speech: Speech normal.        Behavior: Behavior normal. Behavior is cooperative.        Thought Content: Thought content normal.        Cognition and Memory: Cognition and memory normal.        Judgment: Judgment normal.    Review of Systems  Musculoskeletal:       Left foot pain  Psychiatric/Behavioral: Positive for substance abuse. The patient is nervous/anxious.   All other systems reviewed and are negative.  Blood pressure 117/82, pulse 89, temperature 98.1 F (36.7 C), temperature source Oral, resp. rate 18, height 6' (1.829 m), weight 67.3 kg, SpO2 100 %. Body mass index is 20.12 kg/m.  Treatment Plan Summary: Alcohol use disorder: -Continue CIWA with Ativan detox and Valium detox protocol -Recommend rehab after discharge  Anxiety: -Started gabapentin 100 mg TID PRN  Insomnia: -Started Seroquel 25 mg at bedtime PRN  Disposition: No evidence of imminent risk to self or others at present.   Patient does not meet criteria for psychiatric inpatient admission.  Nanine MeansJamison Guerry Covington, NP 08/02/2020 12:27 PM

## 2020-08-03 LAB — VANCOMYCIN, TROUGH: Vancomycin Tr: 10 ug/mL — ABNORMAL LOW (ref 15–20)

## 2020-08-03 LAB — CBC
HCT: 30.8 % — ABNORMAL LOW (ref 39.0–52.0)
Hemoglobin: 9.8 g/dL — ABNORMAL LOW (ref 13.0–17.0)
MCH: 26.8 pg (ref 26.0–34.0)
MCHC: 31.8 g/dL (ref 30.0–36.0)
MCV: 84.2 fL (ref 80.0–100.0)
Platelets: 188 10*3/uL (ref 150–400)
RBC: 3.66 MIL/uL — ABNORMAL LOW (ref 4.22–5.81)
RDW: 14.6 % (ref 11.5–15.5)
WBC: 7.8 10*3/uL (ref 4.0–10.5)
nRBC: 0 % (ref 0.0–0.2)

## 2020-08-03 LAB — MAGNESIUM: Magnesium: 1.7 mg/dL (ref 1.7–2.4)

## 2020-08-03 LAB — BASIC METABOLIC PANEL
Anion gap: 9 (ref 5–15)
BUN: <5 mg/dL — ABNORMAL LOW (ref 6–20)
CO2: 24 mmol/L (ref 22–32)
Calcium: 8.3 mg/dL — ABNORMAL LOW (ref 8.9–10.3)
Chloride: 100 mmol/L (ref 98–111)
Creatinine, Ser: 0.44 mg/dL — ABNORMAL LOW (ref 0.61–1.24)
GFR, Estimated: >60 mL/min (ref >60)
Glucose, Bld: 96 mg/dL (ref 70–99)
Potassium: 3.6 mmol/L (ref 3.5–5.1)
Sodium: 133 mmol/L — ABNORMAL LOW (ref 135–145)

## 2020-08-03 LAB — PHOSPHORUS: Phosphorus: 3.6 mg/dL (ref 2.5–4.6)

## 2020-08-03 LAB — VANCOMYCIN, PEAK: Vancomycin Pk: 21 ug/mL — ABNORMAL LOW (ref 30–40)

## 2020-08-03 MED ORDER — SODIUM CHLORIDE 0.9 % IV SOLN: INTRAVENOUS | Status: DC

## 2020-08-03 MED ORDER — MAGNESIUM OXIDE 400 (241.3 MG) MG PO TABS
800.0000 mg | ORAL_TABLET | Freq: Once | ORAL | Status: AC
  Administered 2020-08-03: 800 mg via ORAL
  Filled 2020-08-03: qty 2

## 2020-08-03 NOTE — Progress Notes (Incomplete)
Pharmacy Antibiotic Note  Jake Nguyen is a 55 y.o. male admitted on 07/31/2020 with wound infection. Pharmacy has been consulted for vancomycin and cefepime dosing. Zosyn switched to cefepime to reduce the risk for AKI. Pt is afebrile and WBC is WNL. VVS consulted.    Vanc levels indicate AUC ***   Plan: Cefepime 2g q8hr *** vancomycin 1250mg  IV Q12H F/u renal fxn, C&S, clinical status and vanc levels, duration   Height: 6' (182.9 cm) Weight: 67.3 kg (148 lb 5.9 oz) IBW/kg (Calculated) : 77.6  Temp (24hrs), Avg:98.4 F (36.9 C), Min:97.6 F (36.4 C), Max:99.3 F (37.4 C)  Recent Labs  Lab 07/31/20 1036 07/31/20 1042 07/31/20 2152 08/01/20 0224 08/02/20 0105 08/03/20 0615  WBC 8.5  --   --  6.9 6.5 7.8  CREATININE 0.60*  --   --   --  0.55* 0.44*  LATICACIDVEN  --  1.6 1.1  --   --   --   VANCOPEAK  --   --   --   --   --  21*    Estimated Creatinine Clearance: 102.8 mL/min (A) (by C-G formula based on SCr of 0.44 mg/dL (L)).    Allergies  Allergen Reactions  . Pork-Derived Products Other (See Comments)    Pt states he doesn't eat pork   Antimicrobials Vanc 4/22>> Zosyn 4/22>>4/24 Cefepime 4/24 >>  Vanc levels  4/25 VP= 21 & VT = ***    Microbiology 4/23 Bcx ngtd     Thank you for allowing pharmacy to be a part of this patient's care.  5/23, PharmD, BCPS, BCCP Clinical Pharmacist  Please check AMION for all [facility] Pharmacy phone numbers After 10:00 PM, call Main Pharmacy [phone]

## 2020-08-03 NOTE — Progress Notes (Signed)
   Planleftlower extremity angiogram with possible intervention via right common femoral arteryapproach in cath lab Tuesday 08/04/20  NPO past MN  Mosetta Pigeon PA-C

## 2020-08-03 NOTE — Progress Notes (Signed)
PROGRESS NOTE  Jake Nguyen IOE:[id number] DOB: 10-27-1965 DOA: 07/31/2020 PCP: Patient, No Pcp Per (Inactive)  HPI/Recap of past 24 hours: Jake Nguyen is a 55 y.o. male with medical history significant of alcohol abuse, left foot gangrene, presented with worsening of the left foot pain.  Patient developed left foot gangrene about 1 month ago and came to [facility], there was plan for vascular surgery to evaluate the patient for further treatment however patient signed out AMA.  Patient has been homeless and living on the streets since.  He reported has been wheelchair-bound for last 2 months because of left foot pain and gangrene.  For last few days, the pain of the left foot has become unbearable, "the pain has been killing me" he could not eat and sleep because of the pain.  This morning he called his brother and told him he wanted to kill himself because of left foot pain.  Brother called police and police arrived and called EMS.  Patient denies any fever chills, no pain on the right leg or foot.  Currently, patient denied any suicidal ideation.  One-to-one sitter in place for patient's own safety.  Vascular surgery consulted.  Left foot x-ray showed gas gangrene soft tissue but no signs of osteomyelitis.  On IV antibiotics empirically.  Seen by vascular surgery with plan for possible intervention on Tuesday.  08/03/20: No new complaints this morning.  Plan for left lower extremity angiogram with possible intervention via right common femoral artery approach in Cath Lab on Tuesday, 08/04/2020.  N.p.o. after midnight.   Assessment/Plan: Active Problems:   Alcohol abuse with alcohol-induced mood disorder (HCC)   Gangrene of foot (HCC)   Gangrene (HCC)   Left foot dry gangrene with concern for superimposed infection, POA Suspected underlying PVD. Vascular surgery consulted, possible intervention on Tuesday Continue IV antibiotics empirically, IV vancomycin and Zosyn.    Follow-up blood cultures x2.  Pain control in place with bowel regimen.    Suspected peripheral vascular disease ABI  Vascular surgery  On daily aspirin 81 mg  Resolved post repletion: Hypokalemia Potassium 3.6 from 3.2.  Hypomagnesemia Magnesium 1.7 Repleted orally with magnesium oxide 800 mg x 1 dose.  Improving hypovolemic hyponatremia Presented with serum sodium 132>.  133 Continue gentle IV fluid hydration normal saline  Alcohol abuse with concern for alcohol withdrawal Reports daily alcohol use Continue CIWA protocol Continue multivitamin, thiamine and folic acid Added Valium with taper. No evidence of alcohol withdrawal at time of this visit. Alcohol cessation counseling  Suspected chronic blood loss in the setting of left fourth dry gangrene Shortly hemoglobin 9.8, baseline hemoglobin appears to be on 11/12/19. No overt bleeding Continue to monitor H&H  Improved sinus tachycardia likely secondary to volume depletion. Not hypoxic with O2 saturation 100% on room air Suspect secondary to hypovolemia Continue IV fluid restriction.   Closely monitor vital signs  Tobacco use disorder Patient states he is having a bad nicotine withdrawal. Started nicotine patch 21 mg daily.  Code Status: Full code.  Family Communication: None at bedside.  Disposition Plan: Likely will discharge once vascular surgery signs off.   Consultants:  Vascular surgery  Psychiatry  Procedures:  None.  Antimicrobials:  IV vancomycin  IV Zosyn.  DVT prophylaxis: Subcu Lovenox daily  Status is: Inpatient    Dispo: The patient is from: Homeless              Anticipated d/c is to: Undetermined  Patient currently not appropriate for discharge due to ongoing management of left foot gangrene.   Difficult to place patient, not applicable        Objective: Vitals:   08/02/20 2308 08/03/20 0333 08/03/20 0729 08/03/20 1254  BP: 130/90 (!) 126/98 128/87  100/66  Pulse: (!) 114 (!) 117 88 100  Resp: 20 20 19 18   Temp: 98.2 F (36.8 C) 98.2 F (36.8 C) 99.3 F (37.4 C) 98.5 F (36.9 C)  TempSrc: Oral Oral Oral Axillary  SpO2: 100% 98% 98% 100%  Weight:      Height:        Intake/Output Summary (Last 24 hours) at 08/03/2020 1606 Last data filed at 08/03/2020 1536 Gross per 24 hour  Intake 2421.67 ml  Output 1200 ml  Net 1221.67 ml   Filed Weights   07/31/20 1300 07/31/20 2135  Weight: 68.9 kg 67.3 kg    Exam:  . General: 55 y.o. year-old male chronically ill-appearing no acute distress.  He is alert oriented x3.   . Cardiovascular: Regular rate and rhythm no rubs or gallops. Marland Kitchen. Respiratory: Clear to auscultation no wheezes or rales. . Abdomen: Soft nontender normal bowel sounds present.   . Musculoskeletal: No lower extremity edema bilaterally.  Left with gangrene noted.   Marland Kitchen. Psychiatry: Mood is appropriate for condition and setting.   Data Reviewed: CBC: Recent Labs  Lab 07/31/20 1036 08/01/20 0224 08/02/20 0105 08/03/20 0615  WBC 8.5 6.9 6.5 7.8  NEUTROABS 5.0  --   --   --   HGB 10.5* 9.8* 9.3* 9.8*  HCT 32.2* 30.3* 29.4* 30.8*  MCV 83.9 83.9 85.0 84.2  PLT 247 211 192 188   Basic Metabolic Panel: Recent Labs  Lab 07/31/20 1036 08/02/20 0105 08/03/20 0615  NA 132* 132* 133*  K 3.6 3.2* 3.6  CL 93* 97* 100  CO2 28 30 24   GLUCOSE 89 99 96  BUN <5* <5* <5*  CREATININE 0.60* 0.55* 0.44*  CALCIUM 8.3* 8.0* 8.3*  MG  --   --  1.7  PHOS  --   --  3.6   GFR: Estimated Creatinine Clearance: 102.8 mL/min (A) (by C-G formula based on SCr of 0.44 mg/dL (L)). Liver Function Tests: Recent Labs  Lab 07/31/20 1036  AST 26  ALT 14  ALKPHOS 58  BILITOT 0.4  PROT 8.6*  ALBUMIN 2.3*   No results for input(s): LIPASE, AMYLASE in the last 168 hours. No results for input(s): AMMONIA in the last 168 hours. Coagulation Profile: No results for input(s): INR, PROTIME in the last 168 hours. Cardiac Enzymes: No  results for input(s): CKTOTAL, CKMB, CKMBINDEX, TROPONINI in the last 168 hours. BNP (last 3 results) No results for input(s): PROBNP in the last 8760 hours. HbA1C: No results for input(s): HGBA1C in the last 72 hours. CBG: No results for input(s): GLUCAP in the last 168 hours. Lipid Profile: Recent Labs    07/31/20 2152  CHOL 105  HDL 47  LDLCALC 51  TRIG 37  CHOLHDL 2.2   Thyroid Function Tests: No results for input(s): TSH, T4TOTAL, FREET4, T3FREE, THYROIDAB in the last 72 hours. Anemia Panel: No results for input(s): VITAMINB12, FOLATE, FERRITIN, TIBC, IRON, RETICCTPCT in the last 72 hours. Urine analysis:    Component Value Date/Time   COLORURINE YELLOW 05/20/2018 1458   APPEARANCEUR CLEAR 05/20/2018 1458   LABSPEC 1.024 05/20/2018 1458   PHURINE 6.0 05/20/2018 1458   GLUCOSEU NEGATIVE 05/20/2018 1458   HGBUR LARGE (A) 05/20/2018  1458   BILIRUBINUR NEGATIVE 05/20/2018 1458   KETONESUR 80 (A) 05/20/2018 1458   PROTEINUR >=300 (A) 05/20/2018 1458   UROBILINOGEN 0.2 08/02/2009 2144   NITRITE NEGATIVE 05/20/2018 1458   LEUKOCYTESUR NEGATIVE 05/20/2018 1458   Sepsis Labs: @LABRCNTIP (procalcitonin:4,lacticidven:4)  ) Recent Results (from the past 240 hour(s))  Resp Panel by RT-PCR (Flu A&B, Covid) Nasopharyngeal Swab     Status: None   Collection Time: 07/31/20 10:36 AM   Specimen: Nasopharyngeal Swab; Nasopharyngeal(NP) swabs in vial transport medium  Result Value Ref Range Status   SARS Coronavirus 2 by RT PCR NEGATIVE NEGATIVE Final    Comment: (NOTE) SARS-CoV-2 target nucleic acids are NOT DETECTED.  The SARS-CoV-2 RNA is generally detectable in upper respiratory specimens during the acute phase of infection. The lowest concentration of SARS-CoV-2 viral copies this assay can detect is 138 copies/mL. A negative result does not preclude SARS-Cov-2 infection and should not be used as the sole basis for treatment or other patient management decisions. A negative  result may occur with  improper specimen collection/handling, submission of specimen other than nasopharyngeal swab, presence of viral mutation(s) within the areas targeted by this assay, and inadequate number of viral copies(<138 copies/mL). A negative result must be combined with clinical observations, patient history, and epidemiological information. The expected result is Negative.  Fact Sheet for Patients:  08/02/20  Fact Sheet for Healthcare Providers:  BloggerCourse.com  This test is no t yet approved or cleared by the [employer]iousBro[employer].it FDA and  has been authorized for detection and/or diagnosis of SARS-CoV-2 by FDA under an Emergency Use Authorization (EUA). This EUA will remain  in effect (meaning this test can be used) for the duration of the COVID-19 declaration under Section 564(b)(1) of the Act, 21 U.S.C.section 360bbb-3(b)(1), unless the authorization is terminated  or revoked sooner.       Influenza A by PCR NEGATIVE NEGATIVE Final   Influenza B by PCR NEGATIVE NEGATIVE Final    Comment: (NOTE) The Xpert Xpress SARS-CoV-2/FLU/RSV plus assay is intended as an aid in the diagnosis of influenza from Nasopharyngeal swab specimens and should not be used as a sole basis for treatment. Nasal washings and aspirates are unacceptable for Xpert Xpress SARS-CoV-2/FLU/RSV testing.  Fact Sheet for Patients: [place]  Fact Sheet for Healthcare Providers: BloggerCourse.com  This test is not yet approved or cleared by the [employer]iousBroker.it FDA and has been authorized for detection and/or diagnosis of SARS-CoV-2 by FDA under an Emergency Use Authorization (EUA). This EUA will remain in effect (meaning this test can be used) for the duration of the COVID-19 declaration under Section 564(b)(1) of the Act, 21 U.S.C. section 360bbb-3(b)(1), unless the authorization is  terminated or revoked.  Performed at [facility], [address], [place], [place]   Culture, blood (routine x 2)     Status: None (Preliminary result)   Collection Time: 08/01/20 11:57 AM   Specimen: BLOOD  Result Value Ref Range Status   Specimen Description BLOOD RIGHT ANTECUBITAL  Final   Special Requests   Final    BOTTLES DRAWN AEROBIC AND ANAEROBIC Blood Culture adequate volume   Culture   Final    NO GROWTH 2 DAYS Performed at [facility], [address], [place], [place]    Report Status PENDING  Incomplete  Culture, blood (routine x 2)     Status: None (Preliminary result)   Collection Time: 08/01/20 12:04 PM   Specimen: BLOOD LEFT HAND  Result Value Ref Range Status   Specimen Description BLOOD LEFT HAND  Final   Special Requests   Final    BOTTLES DRAWN AEROBIC AND ANAEROBIC Blood Culture results may not be optimal due to an inadequate volume of blood received in culture bottles   Culture   Final    NO GROWTH 2 DAYS Performed at [facility], [address] [place], [place] [address]    Report Status PENDING  Incomplete      Studies: No results found.  Scheduled Meds: . aspirin EC  81 mg Oral Daily  . diazepam  10 mg Oral BID   Followed by  . [START ON 08/04/2020] diazepam  5 mg Oral BID  . enoxaparin (LOVENOX) injection  40 mg Subcutaneous Q24H  . folic acid  1 mg Oral Daily  . multivitamin with minerals  1 tablet Oral Daily  . nicotine  21 mg Transdermal Daily  . senna-docusate  2 tablet Oral BID  . thiamine  100 mg Oral Daily   Or  . thiamine  100 mg Intravenous Daily    Continuous Infusions: . sodium chloride 50 mL/hr at 08/03/20 0610  . ceFEPime (MAXIPIME) IV 2 g (08/03/20 1358)  . vancomycin 1,250 mg (08/03/20 1517)     LOS: 3 days     Darlin Drop, MD [facility] Pager [phone]  If 7PM-7AM, please contact night-coverage [email or web address] Password [facility] 08/03/2020, 4:06 PM

## 2020-08-03 NOTE — Progress Notes (Signed)
Pharmacy Antibiotic Note  Jake Nguyen is a 55 y.o. male admitted on 07/31/2020 with wound infection.  Pharmacy has been consulted for vancomycin and cefepime dosing. Pt is afebrile and WBC is WNL. VVS consulted. Scr is WNL and lactic acid is <2.  xray noted to have gangrene bu no osteomyelitis.   Vancomycin 1250mg  q12h  Vancomycin peak level 21 trough level 10  calculated AUC 421 (at goal 400-550)  Plan: Continue cefepime 2g q8hr Continue vancomycin 1250mg  IV Q12H F/u renal fxn, C&S, clinical status and peak/trough at SS  Height: 6' (182.9 cm) Weight: 67.3 kg (148 lb 5.9 oz) IBW/kg (Calculated) : 77.6  Temp (24hrs), Avg:98.6 F (37 C), Min:98.2 F (36.8 C), Max:99.3 F (37.4 C)  Recent Labs  Lab 07/31/20 1036 07/31/20 1042 07/31/20 2152 08/01/20 0224 08/02/20 0105 08/03/20 0615 08/03/20 1326  WBC 8.5  --   --  6.9 6.5 7.8  --   CREATININE 0.60*  --   --   --  0.55* 0.44*  --   LATICACIDVEN  --  1.6 1.1  --   --   --   --   VANCOTROUGH  --   --   --   --   --   --  10*  VANCOPEAK  --   --   --   --   --  21*  --     Estimated Creatinine Clearance: 102.8 mL/min (A) (by C-G formula based on SCr of 0.44 mg/dL (L)).    Allergies  Allergen Reactions  . Pork-Derived Products Other (See Comments)    Pt states he doesn't eat pork      08/05/20 Pharm.D. CPP, BCPS Clinical Pharmacist [phone] 08/03/2020 6:16 PM

## 2020-08-03 NOTE — Consult Note (Signed)
WOC Nurse Consult Note: Reason for Consult:LEft foot gangrene.  Vascular surgery has consulted and recommending left lower angiogram with possible common femoral artery in cath lab for tomorrow.  High risk amputation.  Will implement betadine daily as indicated for PAD with gangrene.  Wound type:infectious/arterial Pressure Injury POA: NA Measurement:LEft foot, including dorsum) is black and cool Wound JHE:[id number] Drainage (amount, consistency, odor) dry  None  Necrotic odor Periwound:NA Dressing procedure/placement/frequency:Paint left foot gangrenous changes with betadine daily.  Awaiting vascular intervention.  Will not follow at this time.  Please re-consult if needed.  Maple Hudson MSN, RN, FNP-BC CWON Wound, Ostomy, Continence Nurse Pager [phone]

## 2020-08-04 ENCOUNTER — Inpatient Hospital Stay (HOSPITAL_COMMUNITY): Payer: Medicaid Other

## 2020-08-04 ENCOUNTER — Inpatient Hospital Stay (HOSPITAL_COMMUNITY)
Admission: EM | Disposition: A | Discharge status: Skilled Nursing Facility | Payer: Self-pay | Source: Home / Self Care | Attending: Internal Medicine

## 2020-08-04 DIAGNOSIS — I70262 Atherosclerosis of native arteries of extremities with gangrene, left leg: Secondary | ICD-10-CM

## 2020-08-04 DIAGNOSIS — I96 Gangrene, not elsewhere classified: Secondary | ICD-10-CM

## 2020-08-04 DIAGNOSIS — I739 Peripheral vascular disease, unspecified: Secondary | ICD-10-CM

## 2020-08-04 DIAGNOSIS — L0390 Cellulitis, unspecified: Secondary | ICD-10-CM

## 2020-08-04 HISTORY — PX: ABDOMINAL AORTOGRAM W/LOWER EXTREMITY: CATH118223

## 2020-08-04 LAB — CBC
HCT: 30.3 % — ABNORMAL LOW (ref 39.0–52.0)
Hemoglobin: 9.6 g/dL — ABNORMAL LOW (ref 13.0–17.0)
MCH: 26.8 pg (ref 26.0–34.0)
MCHC: 31.7 g/dL (ref 30.0–36.0)
MCV: 84.6 fL (ref 80.0–100.0)
Platelets: 203 10*3/uL (ref 150–400)
RBC: 3.58 MIL/uL — ABNORMAL LOW (ref 4.22–5.81)
RDW: 14.6 % (ref 11.5–15.5)
WBC: 7.9 10*3/uL (ref 4.0–10.5)
nRBC: 0 % (ref 0.0–0.2)

## 2020-08-04 LAB — SURGICAL PCR SCREEN
MRSA, PCR: NEGATIVE
Staphylococcus aureus: POSITIVE — AB

## 2020-08-04 LAB — BASIC METABOLIC PANEL
Anion gap: 7 (ref 5–15)
BUN: <5 mg/dL — ABNORMAL LOW (ref 6–20)
CO2: 29 mmol/L (ref 22–32)
Calcium: 8.7 mg/dL — ABNORMAL LOW (ref 8.9–10.3)
Chloride: 99 mmol/L (ref 98–111)
Creatinine, Ser: 0.6 mg/dL — ABNORMAL LOW (ref 0.61–1.24)
GFR, Estimated: >60 mL/min (ref >60)
Glucose, Bld: 117 mg/dL — ABNORMAL HIGH (ref 70–99)
Potassium: 3.8 mmol/L (ref 3.5–5.1)
Sodium: 135 mmol/L (ref 135–145)

## 2020-08-04 SURGERY — ABDOMINAL AORTOGRAM W/LOWER EXTREMITY
Anesthesia: LOCAL

## 2020-08-04 MED ORDER — ACETAMINOPHEN 325 MG PO TABS: 650.0000 mg | ORAL_TABLET | ORAL | Status: DC | PRN

## 2020-08-04 MED ORDER — SODIUM CHLORIDE 0.9% FLUSH
3.0000 mL | INTRAVENOUS | Status: DC | PRN
  Administered 2020-08-05: 3 mL via INTRAVENOUS

## 2020-08-04 MED ORDER — IODIXANOL 320 MG/ML IV SOLN
INTRAVENOUS | Status: DC | PRN
Start: 2020-08-04 — End: 2020-08-04
  Administered 2020-08-04: 103 mL via INTRA_ARTERIAL

## 2020-08-04 MED ORDER — SODIUM CHLORIDE 0.9 % IV SOLN
250.0000 mL | INTRAVENOUS | Status: DC | PRN
  Administered 2020-08-05: 250 mL via INTRAVENOUS

## 2020-08-04 MED ORDER — MIDAZOLAM HCL 2 MG/2ML IJ SOLN
INTRAMUSCULAR | Status: AC
  Filled 2020-08-04: qty 2

## 2020-08-04 MED ORDER — LABETALOL HCL 5 MG/ML IV SOLN: 10.0000 mg | INTRAVENOUS | Status: DC | PRN

## 2020-08-04 MED ORDER — LIDOCAINE HCL (PF) 1 % IJ SOLN
INTRAMUSCULAR | Status: AC
  Filled 2020-08-04: qty 30

## 2020-08-04 MED ORDER — MIDAZOLAM HCL 2 MG/2ML IJ SOLN
INTRAMUSCULAR | Status: DC | PRN
  Administered 2020-08-04: 1 mg via INTRAVENOUS

## 2020-08-04 MED ORDER — FENTANYL CITRATE (PF) 100 MCG/2ML IJ SOLN
INTRAMUSCULAR | Status: AC
  Filled 2020-08-04: qty 2

## 2020-08-04 MED ORDER — SODIUM CHLORIDE 0.9 % WEIGHT BASED INFUSION
1.0000 mL/kg/h | INTRAVENOUS | Status: DC
  Administered 2020-08-04: 1 mL/kg/h via INTRAVENOUS

## 2020-08-04 MED ORDER — ONDANSETRON HCL 4 MG/2ML IJ SOLN: 4.0000 mg | Freq: Four times a day (QID) | INTRAMUSCULAR | Status: DC | PRN

## 2020-08-04 MED ORDER — ASPIRIN EC 325 MG PO TBEC
325.0000 mg | DELAYED_RELEASE_TABLET | Freq: Every day | ORAL | Status: DC
  Administered 2020-08-04 – 2020-08-27 (×24): 325 mg via ORAL
  Filled 2020-08-04 (×24): qty 1

## 2020-08-04 MED ORDER — LIDOCAINE HCL (PF) 1 % IJ SOLN
INTRAMUSCULAR | Status: DC | PRN
  Administered 2020-08-04: 15 mL via INTRADERMAL

## 2020-08-04 MED ORDER — MUPIROCIN 2 % EX OINT
1.0000 "application " | TOPICAL_OINTMENT | Freq: Two times a day (BID) | CUTANEOUS | Status: AC
  Administered 2020-08-04 – 2020-08-08 (×9): 1 via NASAL
  Filled 2020-08-04 (×2): qty 22

## 2020-08-04 MED ORDER — HYDRALAZINE HCL 20 MG/ML IJ SOLN: 5.0000 mg | INTRAMUSCULAR | Status: DC | PRN

## 2020-08-04 MED ORDER — FENTANYL CITRATE (PF) 100 MCG/2ML IJ SOLN
INTRAMUSCULAR | Status: DC | PRN
  Administered 2020-08-04: 50 ug via INTRAVENOUS

## 2020-08-04 MED ORDER — SODIUM CHLORIDE 0.9% FLUSH
3.0000 mL | Freq: Two times a day (BID) | INTRAVENOUS | Status: DC
  Administered 2020-08-06 – 2020-08-25 (×32): 3 mL via INTRAVENOUS

## 2020-08-04 MED ORDER — HEPARIN (PORCINE) IN NACL 1000-0.9 UT/500ML-% IV SOLN
INTRAVENOUS | Status: AC
  Filled 2020-08-04: qty 1000

## 2020-08-04 SURGICAL SUPPLY — 9 items
CATH OMNI FLUSH 5F 65CM (CATHETERS) ×1 IMPLANT
DEVICE TORQUE H2O (MISCELLANEOUS) ×1 IMPLANT
GUIDEWIRE ANGLED .035X150CM (WIRE) ×1 IMPLANT
KIT MICROPUNCTURE NIT STIFF (SHEATH) ×1 IMPLANT
KIT PV (KITS) ×2 IMPLANT
SHEATH PINNACLE 5F 10CM (SHEATH) ×1 IMPLANT
TRANSDUCER W/STOPCOCK (MISCELLANEOUS) ×2 IMPLANT
TRAY PV CATH (CUSTOM PROCEDURE TRAY) ×2 IMPLANT
WIRE BENTSON .035X145CM (WIRE) ×1 IMPLANT

## 2020-08-04 NOTE — Progress Notes (Signed)
ABI has been completed.  Results can be found under chart review under CV PROC. 08/04/2020 9:48 AM Vaishnav Demartin RVT, RDMS

## 2020-08-04 NOTE — Progress Notes (Signed)
[facility] [place] PROGRESS NOTE  ASSESSMENT / PLAN: Jake Nguyen is a 55 y.o. male with severe gangrene of left forefoot. For angiogram today with Dr. Myra Gianotti. Counseled patient RE: risks / benefits / alternatives. He is somnolent, but expresses understanding. ABI ordered to establish baseline prior to intervention. Continue medical therapy for PAD (ASA / Statin).  SUBJECTIVE: Sleeping soundly. Slow to rouse.   OBJECTIVE: BP 111/79 (BP Location: Left Arm)   Pulse (!) 105   Temp 99 F (37.2 C) (Oral)   Resp 18   Ht 6' (1.829 m)   Wt 67.3 kg   SpO2 100%   BMI 20.12 kg/m   Intake/Output Summary (Last 24 hours) at 08/04/2020 0746 Last data filed at 08/04/2020 8280 Gross per 24 hour  Intake 2779.3 ml  Output 3450 ml  Net -670.7 ml    Palpable femoral pulses Unchanged left forefoot gangrene  CBC Latest Ref Rng & Units 08/04/2020 08/03/2020 08/02/2020  WBC 4.0 - 10.5 K/uL 7.9 7.8 6.5  Hemoglobin 13.0 - 17.0 g/dL 0.3(K) 9.1(P) 9.1(T)  Hematocrit 39.0 - 52.0 % 30.3(L) 30.8(L) 29.4(L)  Platelets 150 - 400 K/uL 203 188 192     CMP Latest Ref Rng & Units 08/04/2020 08/03/2020 08/02/2020  Glucose 70 - 99 mg/dL 056(P) 96 99  BUN 6 - 20 mg/dL <7(X) <4(I) <0(X)  Creatinine 0.61 - 1.24 mg/dL 6.55(V) 7.48(O) 7.07(E)  Sodium 135 - 145 mmol/L 135 133(L) 132(L)  Potassium 3.5 - 5.1 mmol/L 3.8 3.6 3.2(L)  Chloride 98 - 111 mmol/L 99 100 97(L)  CO2 22 - 32 mmol/L 29 24 30   Calcium 8.9 - 10.3 mg/dL ) 8.3(L) 8.0(L)  Total Protein 6.5 - 8.1 g/dL - - -  Total Bilirubin 0.3 - 1.2 mg/dL - - -  Alkaline Phos 38 - 126 U/L - - -  AST 15 - 41 U/L - - -  ALT 0 - 44 U/L - - -    Estimated Creatinine Clearance: 102.8 mL/min (A) (by C-G formula based on SCr of 0.6 mg/dL (L)).  6.7(J. Rande Brunt, MD [facility] Phone Number: [phone] 08/04/2020 7:46 AM

## 2020-08-04 NOTE — Progress Notes (Signed)
Site area: rt groin arterial site Site Prior to Removal:  Level 0 Pressure Applied For: 20 minutes Manual:   yes Patient Status During Pull:  stable Post Pull Site:  Level 0 Post Pull Instructions Given:  yes Post Pull Pulses Present: rt dp dopplered Dressing Applied:  Gauze and tegaderm Bedrest begins @ 1530 Comments:

## 2020-08-04 NOTE — Progress Notes (Signed)
Pt arrived from PACU, VSS, call light within reach, right groin level 0. Alert.  Kalman Jewels, RN 08/04/2020 4:02 PM

## 2020-08-04 NOTE — Progress Notes (Signed)
Physical Therapy Treatment Patient Details Name: Jake Nguyen MRN: [id number] DOB: Apr 03, 1966 Today's Date: 08/04/2020    History of Present Illness 55 y.o. male presents to [facility] ED on 07/31/2020 with worsening L foot pain. Pt with history of L foot gangrene for ~1 month, there were plans for vascular surgery at [facility] however pt left AMA. Plan for LLE angiogram 4/26 PM. PMH includes medical history significant of alcohol abuse, left foot gangrene.    PT Comments    Pt received in supine, alert and oriented to location/situation and with good participation and tolerance for therapeutic exercises and transfer training. He demonstrates improved command following and participation this date despite severe reported LLE pain and needs up to minA for transfers and short gait task at bedside while maintaining NWB on LLE due to pain. Pt given HEP handout (link: [email or web address] Access Code: [email or web address]) and encouraged to perform TID as able. Pt continues to benefit from PT services to progress toward functional mobility goals. Continue to recommend SNF.  Follow Up Recommendations  SNF;Other (comment);Supervision for mobility/OOB (D/C recs will depend on possibility of amputation and WB status)     Equipment Recommendations  Rolling walker with 5" wheels    Recommendations for Other Services       Precautions / Restrictions Precautions Precautions: Fall Precaution Comments: tachycardia Restrictions Weight Bearing Restrictions: No LLE Weight Bearing: Non weight bearing Other Position/Activity Restrictions: no WB restrictions currently, however PT treating pt as NWB LLE due to possible need for LLE amputation per vascular notes    Mobility  Bed Mobility Overal bed mobility: Needs Assistance Bed Mobility: Supine to Sit;Sit to Supine     Supine to sit: Supervision;HOB elevated Sit to supine: Supervision;HOB elevated   General bed mobility comments: pt needs cues for awareness  of lines mostly, no physical assist needed    Transfers Overall transfer level: Needs assistance Equipment used: Rolling walker (2 wheeled);1 person hand held assist Transfers: Sit to/from Stand Sit to Stand: Min assist         General transfer comment: from EOB<>RW, pt compliant with NWB precaution x3 trials  Ambulation/Gait Ambulation/Gait assistance: Min guard;Min assist Gait Distance (Feet): 6 Feet (x2 with seated break) Assistive device: Rolling walker (2 wheeled) Gait Pattern/deviations: Trunk flexed (hop-to pattern)     General Gait Details: ~2-3 hops forward/backward x3 reps (IV line limiting distance) but deferred longer trial due to pt tachycardia to 138 bpm; x2 trials; min guard for forward hops and up to minA for stability with backward hops   Stairs             Wheelchair Mobility    Modified Rankin (Stroke Patients Only)       Balance Overall balance assessment: Needs assistance Sitting-balance support: No upper extremity supported;Feet supported Sitting balance-Leahy Scale: Good     Standing balance support: Bilateral upper extremity supported Standing balance-Leahy Scale: Poor Standing balance comment: reliant on minA and BUE support of walker for dynamic tasks                            Cognition Arousal/Alertness: Awake/alert Behavior During Therapy: Flat affect Overall Cognitive Status: Impaired/Different from baseline Area of Impairment: Safety/judgement;Problem solving                         Safety/Judgement: Decreased awareness of safety   Problem Solving: Requires verbal cues General Comments: improved alertness this date from  previous session, he needs cues for safety with transfers/and for line awareness, he can be impulsive to stand unassisted but will wait if therapist tells him again to wait.      Exercises Other Exercises Other Exercises: supine BLE AROM: hip abd, SLR, heel slides, ankle pumps (limited  on LLE), quad sets x10-15 reps ea Other Exercises: seated BLE AROM: hip flexion (not using arms for support), LAQ x15 reps ea Other Exercises: STS x3 and forward/backward hops for LE/UE strengthening    General Comments General comments (skin integrity, edema, etc.): HR 95-115 bpm supine/resting; HR 120 bpm seated EOB and to 138 bpm with exertion/standing; SpO2 95-100% on RA (poor pleth signal during gait but not short of breath or dizzy). L foot remains gangrenous and pt c/o the odor emanating from his foot      Pertinent Vitals/Pain Pain Assessment: 0-10 Pain Score: 7  Pain Location: LLE (foot), increased to 8/10 with mobility tasks Pain Descriptors / Indicators: Discomfort;Grimacing;Guarding Pain Intervention(s): Monitored during session;Repositioned;Limited activity within patient's tolerance    Home Living                      Prior Function            PT Goals (current goals can now be found in the care plan section) Acute Rehab PT Goals Patient Stated Goal: pt goal to go outside, PT goal to improve balance and transfer quality PT Goal Formulation: With patient Time For Goal Achievement: 08/16/20 Potential to Achieve Goals: Good Progress towards PT goals: Progressing toward goals    Frequency    Min 3X/week      PT Plan Current plan remains appropriate    Co-evaluation              AM-PAC PT "6 Clicks" Mobility   Outcome Measure  Help needed turning from your back to your side while in a flat bed without using bedrails?: None Help needed moving from lying on your back to sitting on the side of a flat bed without using bedrails?: A Little Help needed moving to and from a bed to a chair (including a wheelchair)?: A Little Help needed standing up from a chair using your arms (e.g., wheelchair or bedside chair)?: A Little Help needed to walk in hospital room?: A Little Help needed climbing 3-5 steps with a railing? : A Lot 6 Click Score: 18    End  of Session Equipment Utilized During Treatment: Gait belt Activity Tolerance: Patient tolerated treatment well Patient left: with call bell/phone within reach;in bed;with bed alarm set;with nursing/sitter in room Psychiatrist and his brother Jake Nguyen in room) Nurse Communication: Mobility status;Other (comment) (tachycardia) PT Visit Diagnosis: Unsteadiness on feet (R26.81);Other abnormalities of gait and mobility (R26.89);Muscle weakness (generalized) (M62.81);Pain Pain - Right/Left: Left Pain - part of body: Ankle and joints of foot     Time: 0945-1005 PT Time Calculation (min) (ACUTE ONLY): 20 min  Charges:  $Therapeutic Exercise: 8-22 mins                     Cameron Schwinn P., PTA Acute Rehabilitation Services Pager: [phone] Office: [phone]   Dorathy Kinsman Makynzi Eastland 08/04/2020, 11:02 AM

## 2020-08-04 NOTE — Op Note (Signed)
    Patient name: Jake Nguyen MRN: [id number] DOB: 1965/04/15 Sex: male  08/04/2020 Pre-operative Diagnosis: Left foot gangrene Post-operative diagnosis:  Same Surgeon:  Durene Cal Procedure Performed:  1.  Ultrasound-guided access, right femoral artery  2.  Abdominal aortogram  3.  Second-order catheterization  4.  Bilateral lower extremity runoff  5.  Conscious sedation, 35 minutes   Indications: This is a 55 year old male with a gangrenous left foot.  He comes in for arterial evaluation and possible intervention  Procedure:  The patient was identified in the holding area and taken to room 8.  The patient was then placed supine on the table and prepped and draped in the usual sterile fashion.  A time out was called.  Conscious sedation was administered with the use of IV fentanyl and Versed under continuous physician and nurse monitoring.  Heart rate, blood pressure, and oxygen saturation were continuously monitored.  Total sedation time was 115 minutes.  Ultrasound was used to evaluate the right common femoral artery.  It was patent .  A digital ultrasound image was acquired.  A micropuncture needle was used to access the right common femoral artery under ultrasound guidance.  An 018 wire was advanced without resistance and a micropuncture sheath was placed.  The 018 wire was removed and a benson wire was placed.  The micropuncture sheath was exchanged for a 5 french sheath.  An omniflush catheter was advanced over the wire to the level of L-1.  An abdominal angiogram was obtained.  Next, using the omniflush catheter and a benson wire, the aortic bifurcation was crossed and the catheter was placed into theleft external iliac artery and left runoff was obtained.  right runoff was performed via retrograde sheath injections.  Findings:   Aortogram: No significant renal artery stenosis was identified.  The infrarenal abdominal aorta was widely patent.  Bilateral common and external iliac  arteries are widely patent.  Right Lower Extremity: The right common femoral profundofemoral, and superficial femoral arteries are widely patent without stenosis.  The popliteal artery is widely patent.  There is single-vessel runoff via the peroneal artery  Left Lower Extremity: The left common femoral and profundofemoral artery are widely patent without stenosis.  The superficial femoral artery is patent down to the adductor canal where it occludes.  Through geniculate collaterals, the peroneal artery reconstitutes at its origin.  It does collateralized a posterior tibial artery at the ankle which supplies the foot  Intervention: None  Impression:  #1  No significant aortoiliac occlusive disease  #2  On the right, there was no outflow stenosis.  There is single-vessel runoff via the peroneal artery  #3  On the left, the superficial femoral artery is patent down to the adductor canal where it occludes.  There is reconstitution of the peroneal artery which is patent down to the ankle where it collateralizes to a posterior tibial artery    V. Durene Cal, M.D., [facility] [place] Office: [phone] Pager:  [phone]

## 2020-08-04 NOTE — Progress Notes (Addendum)
PROGRESS NOTE  Jake Nguyen ZOX:[id number] DOB: [birth year] DOA: 07/31/2020 PCP: Patient, No Pcp Per (Inactive)  HPI/Recap of past 24 hours: Jake Nguyen is a 55 y.o. male with medical history significant of alcohol abuse, left foot gangrene, presented with worsening left foot pain.  He is homeless.  Developed left foot gangrene about 1 month ago, came to [facility], there was plan for vascular surgery evaluation however the patient left AMA.  Wheelchair-bound for last 2 months because of left foot pain.  For last few days, the pain of the left foot has become unbearable, "the pain has been killing me" he could not eat and sleep because of the pain.  The morning of his presentation he called his brother and told him he wanted to kill himself because of left foot pain.  Brother called police and police arrived and called EMS.  Currently, patient denies any suicidal ideation.  One-to-one sitter in place for patient's own safety.  Seen by psychiatry, he does not meet criteria for psychiatric inpatient admission, he was started on gabapentin 100 mg 3 times daily as needed for anxiety and Seroquel 25 mg nightly as needed for insomnia.  Seen by vascular surgery with plan for possible intervention via right common femoral artery approach in Cath Lab on 08/04/2020.  Due to concern for alcohol withdrawal he was started on CIWA protocol and scheduled Valium taper.  08/04/20: Patient was seen at bedside.  No evidence of alcohol withdrawal at the time of this visit.  There were no acute events overnight.  ABI was completed.   Assessment/Plan: Active Problems:   Alcohol abuse with alcohol-induced mood disorder (HCC)   Gangrene of foot (HCC)   Gangrene (HCC)   Left foot dry gangrene with concern for superimposed infection seen on x-ray, POA X-ray of the left foot on 07/31/2020 revealed progression of soft tissue gas compatible with infection.  Negative for osteomyelitis. ABI completed evidence  of severe PAD affecting left lower extremity  Vascular surgery following.  Possible intervention today. Continue IV antibiotics empirically, IV vancomycin and Zosyn. Blood cultures negative to date, continue to follow Pain control in place with bowel regimen.    Peripheral artery disease ABI completed on 08/04/2020 showed evidence of severe left lower extremity arterial disease.  The left toe brachial index is absent. No evidence of significant right lower extremity arterial disease.  The right toe brachial index is abnormal. Appreciate vascular surgery's assistance. On daily aspirin 81 mg Last LDL 51 on 07/31/2020.  Resolved post repletion: Hypokalemia Potassium 3.8 from 3.2.  Hypomagnesemia Magnesium 1.7 Repleted orally with magnesium oxide 800 mg x 1 dose.  Resolved hypovolemic hyponatremia Presented with serum sodium 132, repeat 135. Continue gentle IV fluid hydration normal saline  Alcohol abuse with concern for alcohol withdrawal Reports daily alcohol use prior to admission. Continue CIWA protocol with scheduled Valium taper. Continue multivitamin, thiamine and folic acid No evidence of alcohol withdrawal at time of this visit. Alcohol cessation counseling at bedside  Suspected chronic blood loss in the setting of left foot dry gangrene Slowly downtrending hemoglobin 9.6 from 9.8, baseline hemoglobin appears to be 12 on 11/12/2019. No overt bleeding Continue to monitor H&H  Sinus tachycardia likely secondary to volume depletion. Not hypoxic with O2 saturation 98 to 100% on room air Suspect secondary to hypovolemia Continue IV fluid restriction. Closely monitor vital signs  Tobacco use disorder Patient states he is having a bad nicotine withdrawal. Continue nicotine patch 21 mg daily.  Ambulatory dysfunction PT assessed and recommended SNF TOC consulted to assist with SNF placement Patient was homeless prior to admission Continue fall precautions  Code Status:  Full code.  Family Communication: None at bedside.  Disposition Plan: Likely will discharge once vascular surgery signs off.   Consultants:  Vascular surgery  Psychiatry  Procedures:  None.  Antimicrobials:  IV vancomycin  IV Zosyn.  DVT prophylaxis: Subcu Lovenox daily  Status is: Inpatient    Dispo: The patient is from: Homeless              Anticipated d/c is to: SNF if placement can be made.              Patient currently not appropriate for discharge due to ongoing management of left foot gangrene.   Difficult to place patient, not applicable        Objective: Vitals:   08/04/20 0400 08/04/20 0554 08/04/20 0600 08/04/20 0751  BP:  111/79  124/82  Pulse:  (!) 105 96 85  Resp: 20  (!) 21 16  Temp:  99 F (37.2 C)  98.3 F (36.8 C)  TempSrc:  Oral  Oral  SpO2:  100% 99% 100%  Weight:      Height:        Intake/Output Summary (Last 24 hours) at 08/04/2020 1124 Last data filed at 08/04/2020 3428 Gross per 24 hour  Intake 2359.3 ml  Output 2950 ml  Net -590.7 ml   Filed Weights   07/31/20 1300 07/31/20 2135  Weight: 68.9 kg 67.3 kg    Exam:  . General: 55 y.o. year-old male chronically ill-appearing no acute distress.  He is alert and oriented x3.   . Cardiovascular: Tachycardic no rubs or gallops. Marland Kitchen Respiratory: Clear to auscultation with no wheezes or rales. . Abdomen: Soft nontender normal bowel sounds present.   . Musculoskeletal: No lower extremity edema bilaterally.  Left with gangrene noted.   Marland Kitchen Psychiatry: Mood is appropriate for condition and setting   Data Reviewed: CBC: Recent Labs  Lab 07/31/20 1036 08/01/20 0224 08/02/20 0105 08/03/20 0615 08/04/20 0203  WBC 8.5 6.9 6.5 7.8 7.9  NEUTROABS 5.0  --   --   --   --   HGB 10.5* 9.8* 9.3* 9.8* 9.6*  HCT 32.2* 30.3* 29.4* 30.8* 30.3*  MCV 83.9 83.9 85.0 84.2 84.6  PLT 247 211 192 188 203   Basic Metabolic Panel: Recent Labs  Lab 07/31/20 1036 08/02/20 0105  08/03/20 0615 08/04/20 0203  NA 132* 132* 133* 135  K 3.6 3.2* 3.6 3.8  CL 93* 97* 100 99  CO2 28 30 24 29   GLUCOSE 89 99 96 117*  BUN <5* <5* <5* <5*  CREATININE 0.60* 0.55* 0.44* 0.60*  CALCIUM 8.3* 8.0* 8.3* 8.7*  MG  --   --  1.7  --   PHOS  --   --  3.6  --    GFR: Estimated Creatinine Clearance: 102.8 mL/min (A) (by C-G formula based on SCr of 0.6 mg/dL (L)). Liver Function Tests: Recent Labs  Lab 07/31/20 1036  AST 26  ALT 14  ALKPHOS 58  BILITOT 0.4  PROT 8.6*  ALBUMIN 2.3*   No results for input(s): LIPASE, AMYLASE in the last 168 hours. No results for input(s): AMMONIA in the last 168 hours. Coagulation Profile: No results for input(s): INR, PROTIME in the last 168 hours. Cardiac Enzymes: No results for input(s): CKTOTAL, CKMB, CKMBINDEX, TROPONINI in the last 168 hours. BNP (last  3 results) No results for input(s): PROBNP in the last 8760 hours. HbA1C: No results for input(s): HGBA1C in the last 72 hours. CBG: No results for input(s): GLUCAP in the last 168 hours. Lipid Profile: No results for input(s): CHOL, HDL, LDLCALC, TRIG, CHOLHDL, LDLDIRECT in the last 72 hours. Thyroid Function Tests: No results for input(s): TSH, T4TOTAL, FREET4, T3FREE, THYROIDAB in the last 72 hours. Anemia Panel: No results for input(s): VITAMINB12, FOLATE, FERRITIN, TIBC, IRON, RETICCTPCT in the last 72 hours. Urine analysis:    Component Value Date/Time   COLORURINE YELLOW 05/20/2018 1458   APPEARANCEUR CLEAR 05/20/2018 1458   LABSPEC 1.024 05/20/2018 1458   PHURINE 6.0 05/20/2018 1458   GLUCOSEU NEGATIVE 05/20/2018 1458   HGBUR LARGE (A) 05/20/2018 1458   BILIRUBINUR NEGATIVE 05/20/2018 1458   KETONESUR 80 (A) 05/20/2018 1458   PROTEINUR >=300 (A) 05/20/2018 1458   UROBILINOGEN 0.2 08/02/2009 2144   NITRITE NEGATIVE 05/20/2018 1458   LEUKOCYTESUR NEGATIVE 05/20/2018 1458   Sepsis Labs: @LABRCNTIP (procalcitonin:4,lacticidven:4)  ) Recent Results (from the past  240 hour(s))  Resp Panel by RT-PCR (Flu A&B, Covid) Nasopharyngeal Swab     Status: None   Collection Time: 07/31/20 10:36 AM   Specimen: Nasopharyngeal Swab; Nasopharyngeal(NP) swabs in vial transport medium  Result Value Ref Range Status   SARS Coronavirus 2 by RT PCR NEGATIVE NEGATIVE Final    Comment: (NOTE) SARS-CoV-2 target nucleic acids are NOT DETECTED.  The SARS-CoV-2 RNA is generally detectable in upper respiratory specimens during the acute phase of infection. The lowest concentration of SARS-CoV-2 viral copies this assay can detect is 138 copies/mL. A negative result does not preclude SARS-Cov-2 infection and should not be used as the sole basis for treatment or other patient management decisions. A negative result may occur with  improper specimen collection/handling, submission of specimen other than nasopharyngeal swab, presence of viral mutation(s) within the areas targeted by this assay, and inadequate number of viral copies(<138 copies/mL). A negative result must be combined with clinical observations, patient history, and epidemiological information. The expected result is Negative.  Fact Sheet for Patients:  08/02/20  Fact Sheet for Healthcare Providers:  BloggerCourse.com  This test is no t yet approved or cleared by the SeriousBroker.it FDA and  has been authorized for detection and/or diagnosis of SARS-CoV-2 by FDA under an Emergency Use Authorization (EUA). This EUA will remain  in effect (meaning this test can be used) for the duration of the COVID-19 declaration under Section 564(b)(1) of the Act, 21 U.S.C.section 360bbb-3(b)(1), unless the authorization is terminated  or revoked sooner.       Influenza A by PCR NEGATIVE NEGATIVE Final   Influenza B by PCR NEGATIVE NEGATIVE Final    Comment: (NOTE) The Xpert Xpress SARS-CoV-2/FLU/RSV plus assay is intended as an aid in the diagnosis of influenza  from Nasopharyngeal swab specimens and should not be used as a sole basis for treatment. Nasal washings and aspirates are unacceptable for Xpert Xpress SARS-CoV-2/FLU/RSV testing.  Fact Sheet for Patients: [place]  Fact Sheet for Healthcare Providers: BloggerCourse.com  This test is not yet approved or cleared by the SeriousBroker.it FDA and has been authorized for detection and/or diagnosis of SARS-CoV-2 by FDA under an Emergency Use Authorization (EUA). This EUA will remain in effect (meaning this test can be used) for the duration of the COVID-19 declaration under Section 564(b)(1) of the Act, 21 U.S.C. section 360bbb-3(b)(1), unless the authorization is terminated or revoked.  Performed at [facility],  [address] [place], [place] [address]   Culture, blood (routine x 2)     Status: None (Preliminary result)   Collection Time: 08/01/20 11:57 AM   Specimen: BLOOD  Result Value Ref Range Status   Specimen Description BLOOD RIGHT ANTECUBITAL  Final   Special Requests   Final    BOTTLES DRAWN AEROBIC AND ANAEROBIC Blood Culture adequate volume   Culture   Final    NO GROWTH 3 DAYS Performed at [facility], [address] [place], [place] [address]    Report Status PENDING  Incomplete  Culture, blood (routine x 2)     Status: None (Preliminary result)   Collection Time: 08/01/20 12:04 PM   Specimen: BLOOD LEFT HAND  Result Value Ref Range Status   Specimen Description BLOOD LEFT HAND  Final   Special Requests   Final    BOTTLES DRAWN AEROBIC AND ANAEROBIC Blood Culture results may not be optimal due to an inadequate volume of blood received in culture bottles   Culture   Final    NO GROWTH 3 DAYS Performed at [facility], [address] [place], [place] [address]    Report Status PENDING  Incomplete  Surgical PCR screen     Status: Abnormal   Collection Time: 08/04/20  7:47 AM   Specimen:  Nasal Mucosa; Nasal Swab  Result Value Ref Range Status   MRSA, PCR NEGATIVE NEGATIVE Final   Staphylococcus aureus POSITIVE (A) NEGATIVE Final    Comment: (NOTE) The Xpert SA Assay (FDA approved for NASAL specimens in patients 53 years of age and older), is one component of a comprehensive surveillance program. It is not intended to diagnose infection nor to guide or monitor treatment. Performed at [facility], [address] [place], [place] [address]       Studies: VAS US ABI WITH/WO TBI  Result Date: 08/04/2020  LOWER EXTREMITY DOPPLER STUDY Patient Name:  Jake Nguyen  Date of Exam:   08/04/2020 Medical Rec #: [id number]         Accession #:    [id number] Date of Birth: 08/16/1965          Patient Gender: M Patient Age:   54Y [facility]am [facility] Procedure:      VAS US ABI WITH/WO TBI Referring Phys: Heath Larkhomas Hawken --------------------------------------------------------------------------------  Indications: Ulceration, gangrene, and peripheral artery disease. High Risk Factors: Current smoker. Other Factors: Alcohol abuse.  Comparison Study: No previous exams Performing Technologist: Hill, Jody RVT, RDMS  Examination Guidelines: A complete evaluation includes at minimum, Doppler waveform signals and systolic blood pressure reading at the level of bilateral brachial, anterior tibial, and posterior tibial arteries, when vessel segments are accessible. Bilateral testing is considered an integral part of a complete examination. Photoelectric Plethysmograph (PPG) waveforms and toe systolic pressure readings are included as required and additional duplex testing as needed. Limited examinations for reoccurring indications may be performed as noted.  ABI Findings: +---------+------------------+-----+----------+--------+ Right    Rt Pressure (mmHg)IndexWaveform  Comment  +---------+------------------+-----+----------+--------+ Brachial 128                    triphasic           +---------+------------------+-----+----------+--------+ PTA      131               0.94 monophasic         +---------+------------------+-----+----------+--------+ DP       159  1.14 biphasic           +---------+------------------+-----+----------+--------+ Great Toe55                0.40 Abnormal           +---------+------------------+-----+----------+--------+ +---------+------------------+-----+-------------------+-------+ Left     Lt Pressure (mmHg)IndexWaveform           Comment +---------+------------------+-----+-------------------+-------+ Brachial 139                    triphasic                  +---------+------------------+-----+-------------------+-------+ PTA      69                0.50 dampened monophasic        +---------+------------------+-----+-------------------+-------+ DP       0                 0.00 absent                     +---------+------------------+-----+-------------------+-------+ Great Toe0                 0.00 Absent                     +---------+------------------+-----+-------------------+-------+ No previous exams.  Summary: Right: Resting right ankle-brachial index is within normal range. No evidence of significant right lower extremity arterial disease. The right toe-brachial index is abnormal. Left: Resting left ankle-brachial index indicates severe left lower extremity arterial disease. The left toe-brachial index is absent.  *See table(s) above for measurements and observations.     Preliminary     Scheduled Meds: . aspirin EC  81 mg Oral Daily  . diazepam  5 mg Oral BID  . enoxaparin (LOVENOX) injection  40 mg Subcutaneous Q24H  . folic acid  1 mg Oral Daily  . multivitamin with minerals  1 tablet Oral Daily  . mupirocin ointment  1 application Nasal BID  . nicotine  21 mg Transdermal Daily  . senna-docusate  2 tablet Oral BID  . thiamine  100 mg Oral Daily   Or  . thiamine  100 mg  Intravenous Daily    Continuous Infusions: . sodium chloride 50 mL/hr at 08/03/20 0610  . ceFEPime (MAXIPIME) IV 2 g (08/04/20 0616)  . vancomycin 1,250 mg (08/04/20 0224)     LOS: 4 days     Darlin Drop, MD [facility] Pager [phone]  If 7PM-7AM, please contact night-coverage [email or web address] Password [facility] 08/04/2020, 11:24 AM

## 2020-08-05 ENCOUNTER — Encounter (HOSPITAL_COMMUNITY)
Admission: EM | Disposition: A | Discharge status: Skilled Nursing Facility | Payer: Self-pay | Source: Home / Self Care | Attending: Internal Medicine

## 2020-08-05 ENCOUNTER — Inpatient Hospital Stay (HOSPITAL_COMMUNITY): Payer: Medicaid Other | Admitting: Anesthesiology

## 2020-08-05 ENCOUNTER — Encounter (HOSPITAL_COMMUNITY): Payer: Self-pay | Admitting: Surgery

## 2020-08-05 DIAGNOSIS — I70262 Atherosclerosis of native arteries of extremities with gangrene, left leg: Secondary | ICD-10-CM

## 2020-08-05 HISTORY — PX: AMPUTATION: SHX166

## 2020-08-05 LAB — BASIC METABOLIC PANEL
Anion gap: 9 (ref 5–15)
BUN: <5 mg/dL — ABNORMAL LOW (ref 6–20)
CO2: 25 mmol/L (ref 22–32)
Calcium: 8.2 mg/dL — ABNORMAL LOW (ref 8.9–10.3)
Chloride: 102 mmol/L (ref 98–111)
Creatinine, Ser: 0.51 mg/dL — ABNORMAL LOW (ref 0.61–1.24)
GFR, Estimated: >60 mL/min (ref >60)
Glucose, Bld: 107 mg/dL — ABNORMAL HIGH (ref 70–99)
Potassium: 3.6 mmol/L (ref 3.5–5.1)
Sodium: 136 mmol/L (ref 135–145)

## 2020-08-05 LAB — CBC
HCT: 27.1 % — ABNORMAL LOW (ref 39.0–52.0)
Hemoglobin: 8.5 g/dL — ABNORMAL LOW (ref 13.0–17.0)
MCH: 26.9 pg (ref 26.0–34.0)
MCHC: 31.4 g/dL (ref 30.0–36.0)
MCV: 85.8 fL (ref 80.0–100.0)
Platelets: 204 10*3/uL (ref 150–400)
RBC: 3.16 MIL/uL — ABNORMAL LOW (ref 4.22–5.81)
RDW: 14.7 % (ref 11.5–15.5)
WBC: 7.5 10*3/uL (ref 4.0–10.5)
nRBC: 0 % (ref 0.0–0.2)

## 2020-08-05 LAB — TYPE AND SCREEN
ABO/RH(D): B POS
Antibody Screen: NEGATIVE

## 2020-08-05 LAB — ABO/RH: ABO/RH(D): B POS

## 2020-08-05 LAB — PROTIME-INR
INR: 1.1 (ref 0.8–1.2)
Prothrombin Time: 14.2 seconds (ref 11.4–15.2)

## 2020-08-05 SURGERY — AMPUTATION BELOW KNEE
Anesthesia: General | Site: Knee | Laterality: Left

## 2020-08-05 MED ORDER — DEXAMETHASONE SODIUM PHOSPHATE 10 MG/ML IJ SOLN
INTRAMUSCULAR | Status: AC
  Filled 2020-08-05: qty 1

## 2020-08-05 MED ORDER — PHENYLEPHRINE HCL-NACL 10-0.9 MG/250ML-% IV SOLN
INTRAVENOUS | Status: DC | PRN
  Administered 2020-08-05: 45 ug/min via INTRAVENOUS

## 2020-08-05 MED ORDER — PHENYLEPHRINE 40 MCG/ML (10ML) SYRINGE FOR IV PUSH (FOR BLOOD PRESSURE SUPPORT)
PREFILLED_SYRINGE | INTRAVENOUS | Status: DC | PRN
  Administered 2020-08-05: 80 ug via INTRAVENOUS
  Administered 2020-08-05 (×3): 120 ug via INTRAVENOUS

## 2020-08-05 MED ORDER — HEMOSTATIC AGENTS (NO CHARGE) OPTIME
TOPICAL | Status: DC | PRN
  Administered 2020-08-05: 1 via TOPICAL

## 2020-08-05 MED ORDER — FENTANYL CITRATE (PF) 250 MCG/5ML IJ SOLN
INTRAMUSCULAR | Status: AC
  Filled 2020-08-05: qty 5

## 2020-08-05 MED ORDER — BUPIVACAINE LIPOSOME 1.3 % IJ SUSP
INTRAMUSCULAR | Status: DC | PRN
  Administered 2020-08-05: 10 mL via PERINEURAL

## 2020-08-05 MED ORDER — 0.9 % SODIUM CHLORIDE (POUR BTL) OPTIME
TOPICAL | Status: DC | PRN
  Administered 2020-08-05: 1000 mL

## 2020-08-05 MED ORDER — ONDANSETRON HCL 4 MG/2ML IJ SOLN
INTRAMUSCULAR | Status: DC | PRN
  Administered 2020-08-05: 4 mg via INTRAVENOUS

## 2020-08-05 MED ORDER — FENTANYL CITRATE (PF) 100 MCG/2ML IJ SOLN: 25.0000 ug | INTRAMUSCULAR | Status: DC | PRN

## 2020-08-05 MED ORDER — DEXAMETHASONE SODIUM PHOSPHATE 10 MG/ML IJ SOLN
INTRAMUSCULAR | Status: DC | PRN
  Administered 2020-08-05: 8 mg via INTRAVENOUS

## 2020-08-05 MED ORDER — FENTANYL CITRATE (PF) 100 MCG/2ML IJ SOLN
INTRAMUSCULAR | Status: DC | PRN
  Administered 2020-08-05 (×2): 25 ug via INTRAVENOUS

## 2020-08-05 MED ORDER — MORPHINE SULFATE (PF) 2 MG/ML IV SOLN
2.0000 mg | INTRAVENOUS | Status: DC | PRN
  Administered 2020-08-05 – 2020-08-10 (×7): 2 mg via INTRAVENOUS
  Filled 2020-08-05 (×7): qty 1

## 2020-08-05 MED ORDER — MIDAZOLAM HCL 2 MG/2ML IJ SOLN
INTRAMUSCULAR | Status: AC
  Filled 2020-08-05: qty 2

## 2020-08-05 MED ORDER — FENTANYL CITRATE (PF) 100 MCG/2ML IJ SOLN
INTRAMUSCULAR | Status: AC
  Administered 2020-08-05: 100 ug via INTRAVENOUS
  Filled 2020-08-05: qty 2

## 2020-08-05 MED ORDER — FENTANYL CITRATE (PF) 100 MCG/2ML IJ SOLN: 100.0000 ug | Freq: Once | INTRAMUSCULAR | Status: AC

## 2020-08-05 MED ORDER — LACTATED RINGERS IV SOLN: INTRAVENOUS | Status: DC | PRN

## 2020-08-05 MED ORDER — ROPIVACAINE HCL 5 MG/ML IJ SOLN
INTRAMUSCULAR | Status: DC | PRN
  Administered 2020-08-05: 20 mL via PERINEURAL

## 2020-08-05 MED ORDER — LIDOCAINE 2% (20 MG/ML) 5 ML SYRINGE
INTRAMUSCULAR | Status: DC | PRN
  Administered 2020-08-05: 20 mg via INTRAVENOUS

## 2020-08-05 MED ORDER — PROPOFOL 10 MG/ML IV BOLUS
INTRAVENOUS | Status: DC | PRN
  Administered 2020-08-05: 150 mg via INTRAVENOUS

## 2020-08-05 MED ORDER — ENOXAPARIN SODIUM 40 MG/0.4ML ~~LOC~~ SOLN
40.0000 mg | SUBCUTANEOUS | Status: DC
  Administered 2020-08-06 – 2020-08-26 (×21): 40 mg via SUBCUTANEOUS
  Filled 2020-08-05 (×22): qty 0.4

## 2020-08-05 MED ORDER — MIDAZOLAM HCL 2 MG/2ML IJ SOLN: 2.0000 mg | Freq: Once | INTRAMUSCULAR | Status: AC

## 2020-08-05 MED ORDER — PROMETHAZINE HCL 25 MG/ML IJ SOLN: 6.2500 mg | INTRAMUSCULAR | Status: DC | PRN

## 2020-08-05 MED ORDER — MIDAZOLAM HCL 2 MG/2ML IJ SOLN
INTRAMUSCULAR | Status: AC
  Administered 2020-08-05: 2 mg via INTRAVENOUS
  Filled 2020-08-05: qty 2

## 2020-08-05 MED ORDER — BUPIVACAINE HCL (PF) 0.25 % IJ SOLN
INTRAMUSCULAR | Status: DC | PRN
  Administered 2020-08-05: 20 mL

## 2020-08-05 MED ORDER — HEPARIN SODIUM (PORCINE) 1000 UNIT/ML IJ SOLN
INTRAMUSCULAR | Status: AC
  Filled 2020-08-05: qty 1

## 2020-08-05 MED ORDER — ATORVASTATIN CALCIUM 40 MG PO TABS
40.0000 mg | ORAL_TABLET | Freq: Every day | ORAL | Status: DC
  Administered 2020-08-06 – 2020-08-27 (×22): 40 mg via ORAL
  Filled 2020-08-05 (×22): qty 1

## 2020-08-05 MED ORDER — OXYCODONE-ACETAMINOPHEN 5-325 MG PO TABS
1.0000 | ORAL_TABLET | ORAL | Status: DC | PRN
  Administered 2020-08-06 – 2020-08-13 (×30): 2 via ORAL
  Filled 2020-08-05 (×30): qty 2

## 2020-08-05 MED FILL — Heparin Sod (Porcine)-NaCl IV Soln 1000 Unit/500ML-0.9%: INTRAVENOUS | Qty: 1000 | Status: AC

## 2020-08-05 SURGICAL SUPPLY — 59 items
AGENT HMST KT MTR STRL THRMB (HEMOSTASIS) ×1
BANDAGE ESMARK 6X9 LF (GAUZE/BANDAGES/DRESSINGS) IMPLANT
BLADE SAW GIGLI 510 (BLADE) ×2 IMPLANT
BNDG CMPR 9X6 STRL LF SNTH (GAUZE/BANDAGES/DRESSINGS) ×1
BNDG COHESIVE 6X5 TAN STRL LF (GAUZE/BANDAGES/DRESSINGS) ×2 IMPLANT
BNDG ELASTIC 4X5.8 VLCR STR LF (GAUZE/BANDAGES/DRESSINGS) ×2 IMPLANT
BNDG ELASTIC 6X5.8 VLCR STR LF (GAUZE/BANDAGES/DRESSINGS) ×2 IMPLANT
BNDG ESMARK 6X9 LF (GAUZE/BANDAGES/DRESSINGS) ×2
BNDG GAUZE ELAST 4 BULKY (GAUZE/BANDAGES/DRESSINGS) ×1 IMPLANT
CANISTER SUCT 3000ML PPV (MISCELLANEOUS) ×2 IMPLANT
CLIP VESOCCLUDE MED 6/CT (CLIP) ×1 IMPLANT
COVER SURGICAL LIGHT HANDLE (MISCELLANEOUS) ×2 IMPLANT
COVER WAND RF STERILE (DRAPES) ×1 IMPLANT
CUFF TOURN SGL QUICK 34 (TOURNIQUET CUFF)
CUFF TOURN SGL QUICK 42 (TOURNIQUET CUFF) IMPLANT
CUFF TRNQT CYL 34X4.125X (TOURNIQUET CUFF) IMPLANT
DRAIN CHANNEL 19F RND (DRAIN) IMPLANT
DRAPE HALF SHEET 40X57 (DRAPES) ×2 IMPLANT
DRAPE INCISE IOBAN 66X45 STRL (DRAPES) IMPLANT
DRAPE ORTHO SPLIT 77X108 STRL (DRAPES) ×4
DRAPE SURG ORHT 6 SPLT 77X108 (DRAPES) ×2 IMPLANT
DRESSING PREVENA PLUS CUSTOM (GAUZE/BANDAGES/DRESSINGS) IMPLANT
DRSG ADAPTIC 3X8 NADH LF (GAUZE/BANDAGES/DRESSINGS) ×2 IMPLANT
DRSG PREVENA PLUS CUSTOM (GAUZE/BANDAGES/DRESSINGS)
ELECT REM PT RETURN 9FT ADLT (ELECTROSURGICAL) ×2
ELECTRODE REM PT RTRN 9FT ADLT (ELECTROSURGICAL) ×1 IMPLANT
EVACUATOR SILICONE 100CC (DRAIN) IMPLANT
GAUZE 4X4 16PLY RFD (DISPOSABLE) ×1 IMPLANT
GAUZE SPONGE 4X4 12PLY STRL (GAUZE/BANDAGES/DRESSINGS) ×2 IMPLANT
GLOVE BIOGEL PI IND STRL 7.5 (GLOVE) ×1 IMPLANT
GLOVE BIOGEL PI INDICATOR 7.5 (GLOVE) ×1
GLOVE SURG SS PI 7.5 STRL IVOR (GLOVE) ×2 IMPLANT
GOWN STRL REUS W/ TWL LRG LVL3 (GOWN DISPOSABLE) ×2 IMPLANT
GOWN STRL REUS W/ TWL XL LVL3 (GOWN DISPOSABLE) ×1 IMPLANT
GOWN STRL REUS W/TWL LRG LVL3 (GOWN DISPOSABLE) ×4
GOWN STRL REUS W/TWL XL LVL3 (GOWN DISPOSABLE) ×2
KIT BASIN OR (CUSTOM PROCEDURE TRAY) ×2 IMPLANT
KIT TURNOVER KIT B (KITS) ×2 IMPLANT
NS IRRIG 1000ML POUR BTL (IV SOLUTION) ×2 IMPLANT
PACK GENERAL/GYN (CUSTOM PROCEDURE TRAY) ×2 IMPLANT
PAD ARMBOARD 7.5X6 YLW CONV (MISCELLANEOUS) ×4 IMPLANT
PREVENA RESTOR ARTHOFORM 46X30 (CANNISTER) IMPLANT
SPONGE LAP 18X18 RF (DISPOSABLE) ×2 IMPLANT
STAPLER VISISTAT 35W (STAPLE) ×2 IMPLANT
STOCKINETTE IMPERVIOUS LG (DRAPES) ×2 IMPLANT
SURGIFLO W/THROMBIN 8M KIT (HEMOSTASIS) ×1 IMPLANT
SUT BONE WAX W31G (SUTURE) IMPLANT
SUT ETHILON 3 0 PS 1 (SUTURE) IMPLANT
SUT SILK 0 TIES 10X30 (SUTURE) ×1 IMPLANT
SUT SILK 2 0 (SUTURE) ×2
SUT SILK 2 0 SH CR/8 (SUTURE) ×2 IMPLANT
SUT SILK 2-0 18XBRD TIE 12 (SUTURE) IMPLANT
SUT SILK 3 0 (SUTURE) ×2
SUT SILK 3-0 18XBRD TIE 12 (SUTURE) IMPLANT
SUT VIC AB 2-0 CT1 18 (SUTURE) ×6 IMPLANT
TAPE UMBILICAL COTTON 1/8X30 (MISCELLANEOUS) ×1 IMPLANT
TOWEL GREEN STERILE (TOWEL DISPOSABLE) ×3 IMPLANT
UNDERPAD 30X36 HEAVY ABSORB (UNDERPADS AND DIAPERS) ×2 IMPLANT
WATER STERILE IRR 1000ML POUR (IV SOLUTION) ×2 IMPLANT

## 2020-08-05 NOTE — Op Note (Signed)
    Patient name: Jake Nguyen MRN: [id number] DOB: Feb 28, 1966 Sex: male  08/05/2020 Pre-operative Diagnosis: Ischemic left leg Post-operative diagnosis:  Same Surgeon:  Durene Cal Assistants:  Wendi Maya Procedure:   Left below knee amputation Anesthesia:  General Blood Loss:  200 cc Specimens:  Left leg  Findings:  Viable muscle at the amputation site  Indications:  This is a 54 year old male with dry gangrene to the left foot which was felt to not be salvaged with revascularization.  The patient and his sister were in agreement with amputation.  Procedure:  The patient was identified in the holding area and taken to [facility] OR ROOM 16  The patient was then placed supine on the table. general anesthesia was administered.  The patient was prepped and draped in the usual sterile fashion.  A time out was called and antibiotics were administered.  A PA was necessary to expedite the procedure and assist with technical details.  I made an incision 13 cm down from the tibial tuberosity.  A 2/3, 1/3 posterior flap was created.  Cautery was used to divide the subcutaneous tissue and muscle.  I then circumferentially exposed the tibia and fibula.  A periosteal elevator was used to elevate the periosteum.  The tibia was divided with a gigli saw, beveling the anterior surface.  The fibula was divided with a double action bone cutter.  I then divided the neurovascular between clamps.  The remaining tissue was divided with the amputation knife.  The leg was sent as a specimen.  Next, the neurovascular bundle was individullyy ligated, proximal to the cut edge of the tibia.  A rasp was used to smooth the bone edges.  The wound was irrigated and hemostasis was obtained.  I did have to divide extra muscle in order to close the incision without tension.  The fascia was re-approximated with 2-0 vicryl, and the skin was closed with staples.  Sterile dressing was applied.  There were no immediate  complications   Disposition:  To PACU stable   V. Durene Cal, M.D., [facility] [place] Office: [phone] Pager:  [phone]

## 2020-08-05 NOTE — Anesthesia Procedure Notes (Signed)
Anesthesia Regional Block: Adductor canal block   Pre-Anesthetic Checklist: ,, timeout performed, Correct Patient, Correct Site, Correct Laterality, Correct Procedure, Correct Position, site marked, Risks and benefits discussed,  Surgical consent,  Pre-op evaluation,  At surgeon's request and post-op pain management  Laterality: Left  Prep: chloraprep       Needles:  Injection technique: Single-shot  Needle Type: Echogenic Needle     Needle Length: 9cm  Needle Gauge: 21     Additional Needles:   Procedures:,,,, ultrasound used (permanent image in chart),,,,  Narrative:  Start time: 08/05/2020 1:29 PM End time: 08/05/2020 1:33 PM Injection made incrementally with aspirations every 5 mL.  Performed by: Personally  Anesthesiologist: Marcene Duos, MD

## 2020-08-05 NOTE — Transfer of Care (Signed)
Immediate Anesthesia Transfer of Care Note  Patient: Jake Nguyen  Procedure(s) Performed: AMPUTATION BELOW KNEE (Left Knee)  Patient Location: PACU  Anesthesia Type:GA combined with regional for post-op pain  Level of Consciousness: awake and drowsy  Airway & Oxygen Therapy: Patient Spontanous Breathing and Patient connected to nasal cannula oxygen  Post-op Assessment: Report given to RN and Post -op Vital signs reviewed and stable  Post vital signs: Reviewed and stable  Last Vitals:  Vitals Value Taken Time  BP 111/78 08/05/20 1536  Temp    Pulse 80 08/05/20 1536  Resp 14 08/05/20 1536  SpO2 98 % 08/05/20 1536  Vitals shown include unvalidated device data.  Last Pain:  Vitals:   08/05/20 1345  TempSrc:   PainSc: 0-No pain      Patients Stated Pain Goal: 2 (08/05/20 1335)  Complications: No complications documented.

## 2020-08-05 NOTE — Progress Notes (Signed)
Pt received from PACU. VSS. L stump dressing, clean dry and intact. Call light in reach. Will continue to monitor.  Versie Starks, RN

## 2020-08-05 NOTE — Anesthesia Preprocedure Evaluation (Signed)
Anesthesia Evaluation  Patient identified by MRN, date of birth, ID band Patient awake    Reviewed: Allergy & Precautions, NPO status , Patient's Chart, lab work & pertinent test results  Airway Mallampati: II  TM Distance: >3 FB     Dental  (+) Dental Advisory Given   Pulmonary Current Smoker,    breath sounds clear to auscultation       Cardiovascular negative cardio ROS   Rhythm:Regular Rate:Normal     Neuro/Psych  Neuromuscular disease    GI/Hepatic negative GI ROS, Neg liver ROS,   Endo/Other  negative endocrine ROS  Renal/GU negative Renal ROS     Musculoskeletal   Abdominal   Peds  Hematology negative hematology ROS (+) anemia ,   Anesthesia Other Findings   Reproductive/Obstetrics                             Lab Results  Component Value Date   WBC 7.5 08/05/2020   HGB 8.5 (L) 08/05/2020   HCT 27.1 (L) 08/05/2020   MCV 85.8 08/05/2020   PLT 204 08/05/2020   Lab Results  Component Value Date   CREATININE 0.51 (L) 08/05/2020   BUN <5 (L) 08/05/2020   NA 136 08/05/2020   K 3.6 08/05/2020   CL 102 08/05/2020   CO2 25 08/05/2020    Anesthesia Physical Anesthesia Plan  ASA: III  Anesthesia Plan: General   Post-op Pain Management:  Regional for Post-op pain   Induction: Intravenous  PONV Risk Score and Plan: 1 and Dexamethasone, Ondansetron and Treatment may vary due to age or medical condition  Airway Management Planned: LMA and Oral ETT  Additional Equipment:   Intra-op Plan:   Post-operative Plan: Extubation in OR  Informed Consent: I have reviewed the patients History and Physical, chart, labs and discussed the procedure including the risks, benefits and alternatives for the proposed anesthesia with the patient or authorized representative who has indicated his/her understanding and acceptance.     Dental advisory given  Plan Discussed with:  CRNA  Anesthesia Plan Comments:         Anesthesia Quick Evaluation

## 2020-08-05 NOTE — Plan of Care (Signed)

## 2020-08-05 NOTE — Progress Notes (Addendum)
PROGRESS NOTE    Jake Nguyen  ZOX:[id number] DOB: 1966/02/26 DOA: 07/31/2020 PCP: Patient, No Pcp Per (Inactive)    Brief Narrative:  Jake Nguyen is a 55 year old male with past medical history significant for EtOH abuse, left foot gangrene who presented to [facility], ED with worsening left foot pain.  Patient developed left foot gangrene about 1 month ago, evaluated at [facility] with plan for vascular surgery evaluation; however patient left AMA.  He reports being wheelchair-bound for past 2 months because of an bearable left foot pain.  In the ED, his left foot was noted to be with gangrenous changes, was tachycardic without fever.  Lactic acid within normal limits.  WBC within normal limits.  Jake Nguyen/influenza A/B Nguyen negative.  Left foot x-ray with progression of soft tissue gas compatible with infection, negative for osteomyelitis.  Vascular surgery consulted.  Hospital service consulted for further evaluation and management.   Assessment & Plan:   Active Problems:   Alcohol abuse with alcohol-induced mood disorder (HCC)   Gangrene of foot (HCC)   Gangrene (HCC)   Left foot gangrene, severe Patient presenting with progressive/severe left foot pain.  Diagnosed with gangrene left foot roughly 1 month ago.  Has been wheelchair-bound for roughly 2 months.  X-ray left foot 07/31/2020 with progression of soft tissue gas compatible with infection, negative for osteomyelitis.  ABI completed with evidence of severe PAD affecting left lower extremity.  Underwent abdominal aortogram by vascular surgery, Dr. Myra Gianotti on 08/04/2020 with findings of patent superficial femoral artery down to adductor canal where it occludes with reconstitution of the peroneal artery which is patent down to the ankle where it collateralizes well posterior tibial artery. --Vascular surgery following, appreciate assistance --Continue antibiotics with vancomycin and cefepime --Plan for BKA  today  Peripheral artery disease Significant peripheral vascular disease noted on ABI and abdominal aortogram.  LDL 51 07/31/2020. --Atorvastatin 40 mg p.o. daily --Aspirin 81 mg p.o. daily  Anxiety: Gabapentin 2 mg p.o. 3 times daily as needed  Insomnia: Seroquel 25 mg p.o. nightly as needed  Hypovolemic hyponatremia: Resolved Patient presented with a sodium 132, improved with IV fluid hydration.  Hypomagnesemia Repleted. --Repeat electrolytes in a.m.  Hypokalemia: Resolved Repleted. --Repeat electrolytes in the a.m.  Chronic blood loss anemia in the setting of left foot gangrene Baseline hemoglobin around 12.  No overt bleeding. --Hgb 10.5>>8.5 --repeat CBC in am  EtOH use disorder Counseled on need for complete cessation. --CIWAA protocol --Scheduled Valium taper --Multivitamin, thiamine, folic acid --Outpatient substance abuse treatment  Tobacco use disorder Counseled on need for complete cessation.   -- Nicotine patch  Mood disorder Patient was initially placed under IVC for concern of possible suicidal ideation.  Was seen by psychiatry 07/29/2020; and was deemed not an imminent risk to self or others at present and does not meet criteria for psychiatric inpatient admission.  Etiology of his initial statements likely related to severe pain, infection and gangrene of foot with confounding factor of EtOH binge/use disorder -- Discontinue IVC and sitter  Ambulatory dysfunction Seen by PT, recommending SNF.  TOC for placement   DVT prophylaxis: Lovenox   Code Status: Full Code Family Communication: Updated patient brother who is present at bedside this morning  Disposition Plan:  Level of care: Telemetry Medical Status is: Inpatient  Remains inpatient appropriate because:Ongoing diagnostic testing needed not appropriate for outpatient work up, Unsafe d/c plan, IV treatments appropriate due to intensity of illness or inability to take PO  and Inpatient level of care  appropriate due to severity of illness   Dispo:  Patient From: Home  Planned Disposition: To be determined  Medically stable for discharge: No      Consultants:   Vascular Surgery  Procedures:   Adominal aortogram, 08/04/2020, Dr. Myra GianottiBrabham  Antimicrobials:   Vancomycin 4/22>>  Cefepime 4/24>>  Zosyn 4/22 - 4/24    Subjective: Patient seen and examined bedside, resting comfortably.  Sleeping but easily arousable.  Brother present at bedside.  Upset that he keeps on being woken up by multiple people during the day.  Discussed with patient plan for BKA later this afternoon, would like to know time.  No other complaints or concerns at this time.  Denies headache, no chest pain, palpitations, no shortness of breath, no abdominal pain.  No acute events overnight per nursing staff.  Objective: Vitals:   08/05/20 0449 08/05/20 0745 08/05/20 0746 08/05/20 1055  BP: 121/86  (!) 130/91 (!) 147/91  Pulse: 92  83 74  Resp: 18  18 18   Temp: 98.7 F (37.1 C) 98.6 F (37 C) 98.6 F (37 C) 98.3 F (36.8 C)  TempSrc: Oral Oral Oral Oral  SpO2: 100%  100% 93%  Weight:      Height:        Intake/Output Summary (Last 24 hours) at 08/05/2020 1252 Last data filed at 08/05/2020 [id number] Gross per 24 hour  Intake 3446.87 ml  Output 2300 ml  Net 1146.87 ml   Filed Weights   07/31/20 1300 07/31/20 2135  Weight: 68.9 kg 67.3 kg    Examination:  General exam: Appears calm and comfortable, appears older than stated age Respiratory system: Clear to auscultation. Respiratory effort normal.  On room air Cardiovascular system: S1 & S2 heard, RRR. No JVD, murmurs, rubs, gallops or clicks. No pedal edema. Gastrointestinal system: Abdomen is nondistended, soft and nontender. No organomegaly or masses felt. Normal bowel sounds heard. Central nervous system: Alert and oriented. No focal neurological deficits. Extremities: Moves all extremities independently, muscle strength globally intact, noted  left foot gangrene which is malodorous Skin: Gangrenous changes noted as below to the left foot Psychiatry: Judgement and insight appear poor. Mood & affect appropriate.           Data Reviewed: I have personally reviewed following labs and imaging studies  CBC: Recent Labs  Lab 07/31/20 1036 08/01/20 0224 08/02/20 0105 08/03/20 0615 08/04/20 0203 08/05/20 0218  WBC 8.5 6.9 6.5 7.8 7.9 7.5  NEUTROABS 5.0  --   --   --   --   --   HGB 10.5* 9.8* 9.3* 9.8* 9.6* 8.5*  HCT 32.2* 30.3* 29.4* 30.8* 30.3* 27.1*  MCV 83.9 83.9 85.0 84.2 84.6 85.8  PLT 247 211 192 188 203 204   Basic Metabolic Panel: Recent Labs  Lab 07/31/20 1036 08/02/20 0105 08/03/20 0615 08/04/20 0203 08/05/20 0218  NA 132* 132* 133* 135 136  K 3.6 3.2* 3.6 3.8 3.6  CL 93* 97* 100 99 102  CO2 28 30 24 29 25   GLUCOSE 89 99 96 117* 107*  BUN <5* <5* <5* <5* <5*  CREATININE 0.60* 0.55* 0.44* 0.60* 0.51*  CALCIUM 8.3* 8.0* 8.3* 8.7* 8.2*  MG  --   --  1.7  --   --   PHOS  --   --  3.6  --   --    GFR: Estimated Creatinine Clearance: 102.8 mL/min (A) (by C-G formula based on SCr of 0.51 mg/dL (L)).  Liver Function Tests: Recent Labs  Lab 07/31/20 1036  AST 26  ALT 14  ALKPHOS 58  BILITOT 0.4  PROT 8.6*  ALBUMIN 2.3*   No results for input(s): LIPASE, AMYLASE in the last 168 hours. No results for input(s): AMMONIA in the last 168 hours. Coagulation Profile: Recent Labs  Lab 08/05/20 0218  INR 1.1   Cardiac Enzymes: No results for input(s): CKTOTAL, CKMB, CKMBINDEX, TROPONINI in the last 168 hours. BNP (last 3 results) No results for input(s): PROBNP in the last 8760 hours. HbA1C: No results for input(s): HGBA1C in the last 72 hours. CBG: No results for input(s): GLUCAP in the last 168 hours. Lipid Profile: No results for input(s): CHOL, HDL, LDLCALC, TRIG, CHOLHDL, LDLDIRECT in the last 72 hours. Thyroid Function Tests: No results for input(s): TSH, T4TOTAL, FREET4, T3FREE,  THYROIDAB in the last 72 hours. Anemia Panel: No results for input(s): VITAMINB12, FOLATE, FERRITIN, TIBC, IRON, RETICCTPCT in the last 72 hours. Sepsis Labs: Recent Labs  Lab 07/31/20 1042 07/31/20 2152  LATICACIDVEN 1.6 1.1    Recent Results (from the past 240 hour(s))  Resp Panel by RT-Nguyen (Flu A&B, Covid) Nasopharyngeal Swab     Status: None   Collection Time: 07/31/20 10:36 AM   Specimen: Nasopharyngeal Swab; Nasopharyngeal(NP) swabs in vial transport medium  Result Value Ref Range Status   SARS Coronavirus 2 by RT Nguyen NEGATIVE NEGATIVE Final    Comment: (NOTE) SARS-CoV-2 target nucleic acids are NOT DETECTED.  The SARS-CoV-2 RNA is generally detectable in upper respiratory specimens during the acute phase of infection. The lowest concentration of SARS-CoV-2 viral copies this assay can detect is 138 copies/mL. A negative result does not preclude SARS-Cov-2 infection and should not be used as the sole basis for treatment or other patient management decisions. A negative result may occur with  improper specimen collection/handling, submission of specimen other than nasopharyngeal swab, presence of viral mutation(s) within the areas targeted by this assay, and inadequate number of viral copies(<138 copies/mL). A negative result must be combined with clinical observations, patient history, and epidemiological information. The expected result is Negative.  Fact Sheet for Patients:  BloggerCourse.com  Fact Sheet for Healthcare Providers:  SeriousBroker.it  This test is no t yet approved or cleared by the [place] FDA and  has been authorized for detection and/or diagnosis of SARS-CoV-2 by FDA under an Emergency Use Authorization (EUA). This EUA will remain  in effect (meaning this test can be used) for the duration of the COVID-19 declaration under Section 564(b)(1) of the Act, 21 U.S.C.section 360bbb-3(b)(1), unless the  authorization is terminated  or revoked sooner.       Influenza A by Nguyen NEGATIVE NEGATIVE Final   Influenza B by Nguyen NEGATIVE NEGATIVE Final    Comment: (NOTE) The Xpert Xpress SARS-CoV-2/FLU/RSV plus assay is intended as an aid in the diagnosis of influenza from Nasopharyngeal swab specimens and should not be used as a sole basis for treatment. Nasal washings and aspirates are unacceptable for Xpert Xpress SARS-CoV-2/FLU/RSV testing.  Fact Sheet for Patients: BloggerCourse.com  Fact Sheet for Healthcare Providers: SeriousBroker.it  This test is not yet approved or cleared by the [place] FDA and has been authorized for detection and/or diagnosis of SARS-CoV-2 by FDA under an Emergency Use Authorization (EUA). This EUA will remain in effect (meaning this test can be used) for the duration of the COVID-19 declaration under Section 564(b)(1) of the Act, 21 U.S.C. section 360bbb-3(b)(1), unless the authorization is terminated or revoked.  Performed at [facility], [address], [place], [place] [address]   Culture, blood (routine x 2)     Status: None (Preliminary result)   Collection Time: 08/01/20 11:57 AM   Specimen: BLOOD  Result Value Ref Range Status   Specimen Description BLOOD RIGHT ANTECUBITAL  Final   Special Requests   Final    BOTTLES DRAWN AEROBIC AND ANAEROBIC Blood Culture adequate volume   Culture   Final    NO GROWTH 4 DAYS Performed at [facility], [address], [place], [place] [address]    Report Status PENDING  Incomplete  Culture, blood (routine x 2)     Status: None (Preliminary result)   Collection Time: 08/01/20 12:04 PM   Specimen: BLOOD LEFT HAND  Result Value Ref Range Status   Specimen Description BLOOD LEFT HAND  Final   Special Requests   Final    BOTTLES DRAWN AEROBIC AND ANAEROBIC Blood Culture results may not be optimal due to an inadequate volume of blood received  in culture bottles   Culture   Final    NO GROWTH 4 DAYS Performed at [facility], [address], [place], [place] [address]    Report Status PENDING  Incomplete  Surgical Nguyen screen     Status: Abnormal   Collection Time: 08/04/20  7:47 AM   Specimen: Nasal Mucosa; Nasal Swab  Result Value Ref Range Status   MRSA, Nguyen NEGATIVE NEGATIVE Final   Staphylococcus aureus POSITIVE (A) NEGATIVE Final    Comment: (NOTE) The Xpert SA Assay (FDA approved for NASAL specimens in patients 1 years of age and older), is one component of a comprehensive surveillance program. It is not intended to diagnose infection nor to guide or monitor treatment. Performed at [facility], [address], [place], [place] [address]          Radiology Studies: PERIPHERAL VASCULAR CATHETERIZATION  Result Date: 08/04/2020   Patient name: HENOK HEACOCK        MRN: [id number]        DOB: 11/01/1965            Sex: male  08/04/2020 Pre-operative Diagnosis: Left foot gangrene Post-operative diagnosis:  Same Surgeon:  Durene Cal Procedure Performed:             1.  Ultrasound-guided access, right femoral artery             2.  Abdominal aortogram             3.  Second-order catheterization             4.  Bilateral lower extremity runoff             5.  Conscious sedation, 35 minutes   Indications: This is a 55 year old male with a gangrenous left foot.  He comes in for arterial evaluation and possible intervention  Procedure:  The patient was identified in the holding area and taken to room 8.  The patient was then placed supine on the table and prepped and draped in the usual sterile fashion.  A time out was called.  Conscious sedation was administered with the use of IV fentanyl and Versed under continuous physician and nurse monitoring.  Heart rate, blood pressure, and oxygen saturation were continuously monitored.  Total sedation time was 115 minutes.  Ultrasound was used to evaluate the right  common femoral artery.  It was patent .  A digital  ultrasound image was acquired.  A micropuncture needle was used to access the right common femoral artery under ultrasound guidance.  An 018 wire was advanced without resistance and a micropuncture sheath was placed.  The 018 wire was removed and a benson wire was placed.  The micropuncture sheath was exchanged for a 5 french sheath.  An omniflush catheter was advanced over the wire to the level of L-1.  An abdominal angiogram was obtained.  Next, using the omniflush catheter and a benson wire, the aortic bifurcation was crossed and the catheter was placed into theleft external iliac artery and left runoff was obtained.  right runoff was performed via retrograde sheath injections.  Findings:             Aortogram: No significant renal artery stenosis was identified.  The infrarenal abdominal aorta was widely patent.  Bilateral common and external iliac arteries are widely patent.             Right Lower Extremity: The right common femoral profundofemoral, and superficial femoral arteries are widely patent without stenosis.  The popliteal artery is widely patent.  There is single-vessel runoff via the peroneal artery             Left Lower Extremity: The left common femoral and profundofemoral artery are widely patent without stenosis.  The superficial femoral artery is patent down to the adductor canal where it occludes.  Through geniculate collaterals, the peroneal artery reconstitutes at its origin.  It does collateralized a posterior tibial artery at the ankle which supplies the foot  Intervention: None  Impression:             #1  No significant aortoiliac occlusive disease             #2  On the right, there was no outflow stenosis.  There is single-vessel runoff via the peroneal artery             #3  On the left, the superficial femoral artery is patent down to the adductor canal where it occludes.  There is reconstitution of the peroneal artery which is  patent down to the ankle where it collateralizes to a posterior tibial artery    V. Durene Cal, M.D., [facility] [place] Office: [phone] Pager:  [phone]  VAS [place] ABI WITH/WO TBI  Result Date: 08/04/2020  LOWER EXTREMITY DOPPLER STUDY Patient Name:  JACION DISMORE  Date of Exam:   08/04/2020 Medical Rec #: [id number]         Accession #:    [id number] Date of Birth: 09-01-1965          Patient Gender: M Patient Age:   54Y Exam Location:  [facility] Procedure:      VAS [place] ABI WITH/WO TBI Referring Phys: [id number] Rande Brunt HAWKEN --------------------------------------------------------------------------------  Indications: Ulceration, gangrene, and peripheral artery disease. High Risk Factors: Current smoker. Other Factors: Alcohol abuse.  Comparison Study: No previous exams Performing Technologist: Hill, Jody RVT, RDMS  Examination Guidelines: A complete evaluation includes at minimum, Doppler waveform signals and systolic blood pressure reading at the level of bilateral brachial, anterior tibial, and posterior tibial arteries, when vessel segments are accessible. Bilateral testing is considered an integral part of a complete examination. Photoelectric Plethysmograph (PPG) waveforms and toe systolic pressure readings are included as required and additional duplex testing as needed. Limited examinations for reoccurring indications may be performed as noted.  ABI Findings: +---------+------------------+-----+----------+--------+ Right  Rt Pressure (mmHg)IndexWaveform  Comment  +---------+------------------+-----+----------+--------+ Brachial 128                    triphasic          +---------+------------------+-----+----------+--------+ PTA      131               0.94 monophasic         +---------+------------------+-----+----------+--------+ DP       159               1.14 biphasic            +---------+------------------+-----+----------+--------+ Great Toe55                0.40 Abnormal           +---------+------------------+-----+----------+--------+ +---------+------------------+-----+-------------------+-------+ Left     Lt Pressure (mmHg)IndexWaveform           Comment +---------+------------------+-----+-------------------+-------+ Brachial 139                    triphasic                  +---------+------------------+-----+-------------------+-------+ PTA      69                0.50 dampened monophasic        +---------+------------------+-----+-------------------+-------+ DP       0                 0.00 absent                     +---------+------------------+-----+-------------------+-------+ Great Toe0                 0.00 Absent                     +---------+------------------+-----+-------------------+-------+ No previous exams.  Summary: Right: Resting right ankle-brachial index is within normal range. No evidence of significant right lower extremity arterial disease. The right toe-brachial index is abnormal. Left: Resting left ankle-brachial index indicates severe left lower extremity arterial disease. The left toe-brachial index is absent.  *See table(s) above for measurements and observations.  Electronically signed by Waverly Ferrari MD on 08/04/2020 at 7:05:56 PM.    Final         Scheduled Meds: . aspirin EC  325 mg Oral Daily  . diazepam  5 mg Oral BID  . enoxaparin (LOVENOX) injection  40 mg Subcutaneous Q24H  . folic acid  1 mg Oral Daily  . multivitamin with minerals  1 tablet Oral Daily  . mupirocin ointment  1 application Nasal BID  . nicotine  21 mg Transdermal Daily  . senna-docusate  2 tablet Oral BID  . sodium chloride flush  3 mL Intravenous Q12H  . thiamine  100 mg Oral Daily   Or  . thiamine  100 mg Intravenous Daily   Continuous Infusions: . sodium chloride    . ceFEPime (MAXIPIME) IV 2 g (08/05/20 0606)   . vancomycin 1,250 mg (08/05/20 0148)     LOS: 5 days    Time spent: 42 minutes spent on chart review, discussion with nursing staff, consultants, updating family and interview/physical exam; more than 50% of that time was spent in counseling and/or coordination of care.    Alvira Philips [place], DO [facility] Available via Epic secure chat 7am-7pm After these hours, please refer to coverage provider listed on [email or web address] 08/05/2020, 12:52 PM

## 2020-08-05 NOTE — Anesthesia Postprocedure Evaluation (Signed)
Anesthesia Post Note  Patient: Jake Nguyen  Procedure(s) Performed: AMPUTATION BELOW KNEE (Left Knee)     Patient location during evaluation: PACU Anesthesia Type: General Level of consciousness: awake and alert Pain management: pain level controlled Vital Signs Assessment: post-procedure vital signs reviewed and stable Respiratory status: spontaneous breathing, nonlabored ventilation, respiratory function stable and patient connected to nasal cannula oxygen Cardiovascular status: blood pressure returned to baseline and stable Postop Assessment: no apparent nausea or vomiting Anesthetic complications: no   No complications documented.  Last Vitals:  Vitals:   08/05/20 1552 08/05/20 1614  BP: 119/79 129/86  Pulse: 92 80  Resp: 17 16  Temp: 36.7 C 36.8 C  SpO2: 99% 98%    Last Pain:  Vitals:   08/05/20 1614  TempSrc: Oral  PainSc:                  Kennieth Rad

## 2020-08-05 NOTE — Progress Notes (Signed)
Spoke with sister this am regarding a BKA.  She is agreeable with proceeding  Durene Cal

## 2020-08-05 NOTE — Anesthesia Procedure Notes (Signed)
Anesthesia Regional Block: Popliteal block   Pre-Anesthetic Checklist: ,, timeout performed, Correct Patient, Correct Site, Correct Laterality, Correct Procedure, Correct Position, site marked, Risks and benefits discussed,  Surgical consent,  Pre-op evaluation,  At surgeon's request and post-op pain management  Laterality: Left  Prep: chloraprep       Needles:  Injection technique: Single-shot  Needle Type: Echogenic Needle     Needle Length: 9cm  Needle Gauge: 21     Additional Needles:   Procedures:,,,, ultrasound used (permanent image in chart),,,,  Narrative:  Start time: 08/05/2020 1:22 PM End time: 08/05/2020 1:29 PM Injection made incrementally with aspirations every 5 mL.  Performed by: Personally  Anesthesiologist: Marcene Duos, MD

## 2020-08-05 NOTE — Progress Notes (Signed)
RN was made aware by sitter that the patient was eating food given to him by family. When I went into the room there was no evidence of food and patient and family denied that Mr. Jowers had eaten anything. Education on the importance of staying NPO before surgery provided.  Versie Starks, RN

## 2020-08-05 NOTE — Anesthesia Procedure Notes (Signed)
Procedure Name: LMA Insertion Date/Time: 08/05/2020 2:01 PM Performed by: Epifanio Lesches, CRNA Pre-anesthesia Checklist: Patient identified, Emergency Drugs available, Suction available and Patient being monitored Patient Re-evaluated:Patient Re-evaluated prior to induction Oxygen Delivery Method: Circle System Utilized Preoxygenation: Pre-oxygenation with 100% oxygen Induction Type: IV induction Ventilation: Mask ventilation without difficulty LMA: LMA inserted LMA Size: 4.0 Number of attempts: 1 Airway Equipment and Method: Bite block Placement Confirmation: positive ETCO2 Tube secured with: Tape Dental Injury: Teeth and Oropharynx as per pre-operative assessment

## 2020-08-05 NOTE — Progress Notes (Signed)
Physical Therapy Treatment Patient Details Name: Jake Nguyen MRN: [id number] DOB: 1965/05/08 Today's Date: 08/05/2020    History of Present Illness 55 y.o. male presents to [facility] ED on 07/31/2020 with worsening L foot pain. Pt with history of L foot gangrene for ~1 month, there were plans for vascular surgery at [facility] however pt left AMA. He had LLE angiogram 4/26 and plan for L BKA 4/27. PMH includes medical history significant of alcohol abuse, left foot gangrene.    PT Comments    Pt received in supine, agreeable to therapy session and with good participation in gait trials x2. Pt with good compliance with NWB on LLE throughout transfers/gait and needs up to minA for stability along with RW support. He was limited mainly due to pain and tachycardia (HR max 140 bpm, RN notified). Pt continues to benefit from PT services to progress toward functional mobility goals. Per chart review, plan for OR today for BKA so he will need to be seen by PT next date for re-eval. Continue to recommend SNF, pending progress post-op.   Follow Up Recommendations  SNF;Other (comment);Supervision for mobility/OOB (D/C recs will depend on possibility of amputation and WB status)     Equipment Recommendations  Rolling walker with 5" wheels    Recommendations for Other Services       Precautions / Restrictions Precautions Precautions: Fall Precaution Comments: tachycardia Restrictions Weight Bearing Restrictions: No Other Position/Activity Restrictions: no WB restrictions currently, however PT treating pt as NWB LLE due to possible need for LLE amputation per vascular notes    Mobility  Bed Mobility Overal bed mobility: Needs Assistance Bed Mobility: Supine to Sit;Sit to Supine     Supine to sit: Supervision;HOB elevated Sit to supine: Supervision;HOB elevated   General bed mobility comments: pt needs cues for awareness of lines mostly, no physical assist needed    Transfers Overall  transfer level: Needs assistance Equipment used: Rolling walker (2 wheeled);1 person hand held assist Transfers: Sit to/from Stand Sit to Stand: Min guard         General transfer comment: from EOB<>RW, pt compliant with NWB precaution x3 trials  Ambulation/Gait Ambulation/Gait assistance: Min guard;Min assist Gait Distance (Feet): 25 Feet (73ft, seated break, 32ft (40 total)) Assistive device: Rolling walker (2 wheeled) Gait Pattern/deviations: Trunk flexed (hop-to pattern)     General Gait Details: ~4-5 hops forward/backward x3 reps (IV line limiting distance), then seated break, then to door in room and back; min guard for forward hops and up to minA for stability with backward hops; HR to 140 bpm so returned to supine after second trial; good compliance with NWB on LLE   Stairs             Wheelchair Mobility    Modified Rankin (Stroke Patients Only)       Balance Overall balance assessment: Needs assistance Sitting-balance support: No upper extremity supported;Feet supported Sitting balance-Leahy Scale: Good     Standing balance support: Bilateral upper extremity supported Standing balance-Leahy Scale: Poor Standing balance comment: reliant on up to minA and BUE support of walker for dynamic tasks                            Cognition Arousal/Alertness: Awake/alert Behavior During Therapy: Impulsive Overall Cognitive Status: Impaired/Different from baseline Area of Impairment: Safety/judgement;Problem solving  Safety/Judgement: Decreased awareness of safety   Problem Solving: Requires verbal cues General Comments: pt alert and oriented to self/situation (not further assessed) this date, he needs cues for safety with transfers/and for line awareness, he can be impulsive but will wait if therapist tells him again to wait.      Exercises Other Exercises Other Exercises: seated BLE AROM: hip flexion (not using  arms for support), LAQ x10 reps ea    General Comments General comments (skin integrity, edema, etc.): HR 91 bpm resting and up to 140 with exertion/standing; BP 142/94 seated EOB; very painful      Pertinent Vitals/Pain Pain Assessment: 0-10 Pain Score: 7  Faces Pain Scale: Hurts whole lot Pain Location: LLE (foot), increased to 8/10+ with mobility tasks Pain Descriptors / Indicators: Discomfort;Grimacing;Guarding;Moaning Pain Intervention(s): Monitored during session;Repositioned;Ice applied    Home Living                      Prior Function            PT Goals (current goals can now be found in the care plan section) Acute Rehab PT Goals Patient Stated Goal: pt goal to go outside, PT goal to improve balance and transfer quality PT Goal Formulation: With patient Time For Goal Achievement: 08/16/20 Potential to Achieve Goals: Good Progress towards PT goals: Progressing toward goals    Frequency    Min 3X/week      PT Plan Current plan remains appropriate    Co-evaluation              AM-PAC PT "6 Clicks" Mobility   Outcome Measure  Help needed turning from your back to your side while in a flat bed without using bedrails?: None Help needed moving from lying on your back to sitting on the side of a flat bed without using bedrails?: A Little Help needed moving to and from a bed to a chair (including a wheelchair)?: A Little Help needed standing up from a chair using your arms (e.g., wheelchair or bedside chair)?: A Little Help needed to walk in hospital room?: A Little Help needed climbing 3-5 steps with a railing? : A Lot 6 Click Score: 18    End of Session Equipment Utilized During Treatment: Gait belt Activity Tolerance: Patient tolerated treatment well;Patient limited by pain Patient left: with call bell/phone within reach;in bed;with bed alarm set;with nursing/sitter in room (sitter [employer] present) Nurse Communication: Mobility status;Other  (comment) (tachycardia) PT Visit Diagnosis: Unsteadiness on feet (R26.81);Other abnormalities of gait and mobility (R26.89);Muscle weakness (generalized) (M62.81);Pain Pain - Right/Left: Left Pain - part of body: Ankle and joints of foot     Time: 1116-1130 PT Time Calculation (min) (ACUTE ONLY): 14 min  Charges:  $Gait Training: 8-22 mins                     Eliany Mccarter P., PTA Acute Rehabilitation Services Pager: [phone] Office: [phone]   [place]us [place] 08/05/2020, 1:36 PM

## 2020-08-05 NOTE — Progress Notes (Signed)
[facility] [place] PROGRESS NOTE  ASSESSMENT / PLAN: Jake Nguyen is a 55 y.o. male with severe gangrene of left foot. I reviewed his angiogram and explained options in detail to him this morning. Trying to preserve his ankle would require femoral - peroneal bypass and open amputation of the midfoot with skin grafting. This is unlikely to work going forward, especially if he is unable to care for himself. I think the best course of action would be a below knee amputation. I have discussed the case extensively with Dr. Myra Gianotti and he agrees. Will plan for left below knee amputation today in OR.  SUBJECTIVE: No interval changes. Reviewed angiogram findings and options going forward in detail.  OBJECTIVE: BP (!) 130/91 (BP Location: Right Arm)   Pulse 83   Temp 98.6 F (37 C) (Oral)   Resp 18   Ht 6' (1.829 m)   Wt 67.3 kg   SpO2 100%   BMI 20.12 kg/m   Intake/Output Summary (Last 24 hours) at 08/05/2020 0813 Last data filed at 08/05/2020 7824 Gross per 24 hour  Intake 3446.87 ml  Output 2300 ml  Net 1146.87 ml    No distress Unchanged appearance of L foot  CBC Latest Ref Rng & Units 08/05/2020 08/04/2020 08/03/2020  WBC 4.0 - 10.5 K/uL 7.5 7.9 7.8  Hemoglobin 13.0 - 17.0 g/dL 2.3(N) 3.6(R) 4.4(R)  Hematocrit 39.0 - 52.0 % 27.1(L) 30.3(L) 30.8(L)  Platelets 150 - 400 K/uL 204 203 188     CMP Latest Ref Rng & Units 08/05/2020 08/04/2020 08/03/2020  Glucose 70 - 99 mg/dL 154(M) 086(P) 96  BUN 6 - 20 mg/dL <6(P) <9(J) <0(D)  Creatinine 0.61 - 1.24 mg/dL 3.26(Z) 1.24(P) 8.09(X)  Sodium 135 - 145 mmol/L 136 135 133(L)  Potassium 3.5 - 5.1 mmol/L 3.6 3.8 3.6  Chloride 98 - 111 mmol/L 102 99 100  CO2 22 - 32 mmol/L 25 29 24   Calcium 8.9 - 10.3 mg/dL 8.2(L) 8.7(L) 8.3(L)  Total Protein 6.5 - 8.1 g/dL - - -  Total Bilirubin 0.3 - 1.2 mg/dL - - -  Alkaline Phos 38 - 126 U/L - - -  AST 15 - 41 U/L - - -  ALT 0 - 44 U/L - - -    Estimated Creatinine  Clearance: 102.8 mL/min (A) (by C-G formula based on SCr of 0.51 mg/dL (L)).  . Rande Brunt, MD [facility] Phone Number: [phone] 08/05/2020 8:13 AM

## 2020-08-06 ENCOUNTER — Encounter (HOSPITAL_COMMUNITY): Payer: Self-pay | Admitting: Surgery

## 2020-08-06 LAB — CBC
HCT: 25.7 % — ABNORMAL LOW (ref 39.0–52.0)
Hemoglobin: 8.1 g/dL — ABNORMAL LOW (ref 13.0–17.0)
MCH: 27.2 pg (ref 26.0–34.0)
MCHC: 31.5 g/dL (ref 30.0–36.0)
MCV: 86.2 fL (ref 80.0–100.0)
Platelets: 225 10*3/uL (ref 150–400)
RBC: 2.98 MIL/uL — ABNORMAL LOW (ref 4.22–5.81)
RDW: 14.6 % (ref 11.5–15.5)
WBC: 8.5 10*3/uL (ref 4.0–10.5)
nRBC: 0 % (ref 0.0–0.2)

## 2020-08-06 LAB — CULTURE, BLOOD (ROUTINE X 2)
Culture: NO GROWTH
Culture: NO GROWTH
Special Requests: ADEQUATE

## 2020-08-06 LAB — BASIC METABOLIC PANEL
Anion gap: 7 (ref 5–15)
BUN: 7 mg/dL (ref 6–20)
CO2: 25 mmol/L (ref 22–32)
Calcium: 7.9 mg/dL — ABNORMAL LOW (ref 8.9–10.3)
Chloride: 103 mmol/L (ref 98–111)
Creatinine, Ser: 0.53 mg/dL — ABNORMAL LOW (ref 0.61–1.24)
GFR, Estimated: >60 mL/min (ref >60)
Glucose, Bld: 133 mg/dL — ABNORMAL HIGH (ref 70–99)
Potassium: 3.8 mmol/L (ref 3.5–5.1)
Sodium: 135 mmol/L (ref 135–145)

## 2020-08-06 LAB — MAGNESIUM: Magnesium: 1.7 mg/dL (ref 1.7–2.4)

## 2020-08-06 MED ORDER — SODIUM CHLORIDE 0.9 % IV BOLUS
500.0000 mL | Freq: Once | INTRAVENOUS | Status: AC
  Administered 2020-08-06: 500 mL via INTRAVENOUS

## 2020-08-06 MED ORDER — KETOROLAC TROMETHAMINE 30 MG/ML IJ SOLN
30.0000 mg | Freq: Four times a day (QID) | INTRAMUSCULAR | Status: AC
  Administered 2020-08-06 – 2020-08-08 (×10): 30 mg via INTRAVENOUS
  Filled 2020-08-06 (×10): qty 1

## 2020-08-06 NOTE — Progress Notes (Signed)
Pharmacy Antibiotic Note  Jake Nguyen is a 55 y.o. male admitted on 07/31/2020 with wound infection.  Pharmacy has been consulted for vancomycin and cefepime dosing.   S/p L BKA amp on 4/27 - WBC WNL, afebrile. Scr 0.53 (CrCl >100 mL/min). Plan to stop antibiotics 24 hr surgery.   Plan: Stop antibiotics 24 hr after stop date   Height: 6' (182.9 cm) Weight: 67.3 kg (148 lb 5.9 oz) IBW/kg (Calculated) : 77.6  Temp (24hrs), Avg:98.1 F (36.7 C), Min:97.6 F (36.4 C), Max:99.1 F (37.3 C)  Recent Labs  Lab 07/31/20 1042 07/31/20 2152 08/01/20 0224 08/02/20 0105 08/03/20 0615 08/03/20 1326 08/04/20 0203 08/05/20 0218 08/06/20 0144  WBC  --   --    < > 6.5 7.8  --  7.9 7.5 8.5  CREATININE  --   --   --  0.55* 0.44*  --  0.60* 0.51* 0.53*  LATICACIDVEN 1.6 1.1  --   --   --   --   --   --   --   VANCOTROUGH  --   --   --   --   --  10*  --   --   --   VANCOPEAK  --   --   --   --  21*  --   --   --   --    < > = values in this interval not displayed.    Estimated Creatinine Clearance: 102.8 mL/min (A) (by C-G formula based on SCr of 0.53 mg/dL (L)).    Allergies  Allergen Reactions  . Pork-Derived Products Other (See Comments)    Pt states he doesn't eat pork   Sherron Monday, PharmD, BCCCP Clinical Pharmacist  Phone: [phone] 08/06/2020 3:53 PM  Please check AMION for all [facility] phone numbers After 10:00 PM, call Main Pharmacy [phone]

## 2020-08-06 NOTE — Progress Notes (Signed)
    Subjective  - POD #1, s/p left BKA  C/o pain   Physical Exam:  Dressing dry       Assessment/Plan:  POD #1  Severe pain, will treat with narcotis and add toradol Acute blood loss anemia:  Will repeat CBC in am  Wells Dnasia Gauna 08/06/2020 7:58 AM --  Vitals:   08/06/20 0200 08/06/20 0322  BP: 110/77 113/77  Pulse: (!) 113 (!) 103  Resp: 20 20  Temp:  98.4 F (36.9 C)  SpO2: 100% 100%    Intake/Output Summary (Last 24 hours) at 08/06/2020 0758 Last data filed at 08/05/2020 1550 Gross per 24 hour  Intake 1350 ml  Output 800 ml  Net 550 ml     Laboratory CBC    Component Value Date/Time   WBC 8.5 08/06/2020 0144   HGB 8.1 (L) 08/06/2020 0144   HCT 25.7 (L) 08/06/2020 0144   HCT 36.4 (L) 05/20/2018 1923   PLT 225 08/06/2020 0144    BMET    Component Value Date/Time   NA 135 08/06/2020 0144   K 3.8 08/06/2020 0144   CL 103 08/06/2020 0144   CO2 25 08/06/2020 0144   GLUCOSE 133 (H) 08/06/2020 0144   BUN 7 08/06/2020 0144   CREATININE 0.53 (L) 08/06/2020 0144   CALCIUM 7.9 (L) 08/06/2020 0144   GFRNONAA >60 08/06/2020 0144   GFRAA >60 11/12/2019 0359    COAG Lab Results  Component Value Date   INR 1.1 08/05/2020   INR 1.1 11/07/2019   INR 0.93 05/20/2018   No results found for: PTT  Antibiotics Anti-infectives (From admission, onward)   Start     Dose/Rate Route Frequency Ordered Stop   08/02/20 1400  ceFEPIme (MAXIPIME) 2 g in sodium chloride 0.9 % 100 mL IVPB        2 g 200 mL/hr over 30 Minutes Intravenous Every 8 hours 08/02/20 1142     08/01/20 0200  vancomycin (VANCOREADY) IVPB 1250 mg/250 mL        1,250 mg 166.7 mL/hr over 90 Minutes Intravenous Every 12 hours 07/31/20 1317     07/31/20 2000  piperacillin-tazobactam (ZOSYN) IVPB 3.375 g  Status:  Discontinued        3.375 g 12.5 mL/hr over 240 Minutes Intravenous Every 8 hours 07/31/20 1317 08/02/20 1141   07/31/20 1330  piperacillin-tazobactam (ZOSYN) IVPB 3.375 g         3.375 g 100 mL/hr over 30 Minutes Intravenous  Once 07/31/20 1315 07/31/20 1402   07/31/20 1315  vancomycin (VANCOREADY) IVPB 1250 mg/250 mL        1,250 mg 166.7 mL/hr over 90 Minutes Intravenous  Once 07/31/20 1306 07/31/20 1640       V. Charlena Cross, M.D., [facility] [place] Office: [phone] Pager:  [phone]

## 2020-08-06 NOTE — Progress Notes (Signed)
PROGRESS NOTE    LOVIS MORE  ZOX:[id number] DOB: [birth year] DOA: 07/31/2020 PCP: Patient, No Pcp Per (Inactive)    Brief Narrative:  CASTEN FLOREN is a 55 year old male with past medical history significant for EtOH abuse, left foot gangrene who presented to [facility], ED with worsening left foot pain.  Patient developed left foot gangrene about 1 month ago, evaluated at [facility] with plan for vascular surgery evaluation; however patient left AMA.  He reports being wheelchair-bound for past 2 months because of an bearable left foot pain.  In the ED, his left foot was noted to be with gangrenous changes, was tachycardic without fever.  Lactic acid within normal limits.  WBC within normal limits.  Cova-19 PCR/influenza A/B PCR negative.  Left foot x-ray with progression of soft tissue gas compatible with infection, negative for osteomyelitis.  Vascular surgery consulted.  Hospital service consulted for further evaluation and management.   Assessment & Plan:   Active Problems:   Alcohol abuse with alcohol-induced mood disorder (HCC)   Gangrene of foot (HCC)   Gangrene (HCC)   Left foot gangrene, severe Patient presenting with progressive/severe left foot pain.  Diagnosed with gangrene left foot roughly 1 month ago.  Has been wheelchair-bound for roughly 2 months.  X-ray left foot 07/31/2020 with progression of soft tissue gas compatible with infection, negative for osteomyelitis.  ABI completed with evidence of severe PAD affecting left lower extremity.  Underwent abdominal aortogram by vascular surgery, Dr. Myra Gianotti on 08/04/2020 with findings of patent superficial femoral artery down to adductor canal where it occludes with reconstitution of the peroneal artery which is patent down to the ankle where it collateralizes well posterior tibial artery.  Underwent BKA by vascular surgery, Dr. Myra Gianotti on 08/05/2020. --Vascular surgery following, appreciate assistance --Continue  antibiotics with vancomycin and cefepime for 24h post op and then discontinue --Toradol  IV q6h x 3 days --Percocet 5-325mg  1-2 tabs q4h prn moderate pain --Morphine  IV q2h prn severe pain --PT/OT evaluation  Peripheral artery disease Significant peripheral vascular disease noted on ABI and abdominal aortogram.  LDL 51 07/31/2020. --Atorvastatin 40 mg p.o. daily --Aspirin 81 mg p.o. daily  Anxiety: Gabapentin 2 mg p.o. 3 times daily as needed  Insomnia: Seroquel 25 mg p.o. nightly as needed  Hypovolemic hyponatremia: Resolved Patient presented with a sodium 132, improved with IV fluid hydration.  Hypomagnesemia Repleted. --Repeat electrolytes in a.m.  Hypokalemia: Resolved Repleted. --Repeat electrolytes in the a.m.  Chronic blood loss anemia in the setting of left foot gangrene Baseline hemoglobin around 12.  No overt bleeding. --Hgb 10.5>>8.5>8.1 --repeat CBC in am  EtOH use disorder Counseled on need for complete cessation. --CIWAA protocol --Scheduled Valium taper --Multivitamin, thiamine, folic acid --Outpatient substance abuse treatment  Tobacco use disorder Counseled on need for complete cessation.   --Nicotine patch  Mood disorder Patient was initially placed under IVC for concern of possible suicidal ideation.  Was seen by psychiatry 07/29/2020; and was deemed not an imminent risk to self or others at present and does not meet criteria for psychiatric inpatient admission.  Etiology of his initial statements likely related to severe pain, infection and gangrene of foot with confounding factor of EtOH binge/use disorder -- Discontinued IVC and sitter on 4/27  Ambulatory dysfunction Seen by PT, recommending SNF.  TOC for placement --continue PT/OT efforts while inpatient   DVT prophylaxis: Lovenox   Code Status: Full Code Family Communication: No family present at bedside this morning, updated patient's  brother on 4/27  Disposition Plan:  Level of  care: Telemetry Medical Status is: Inpatient  Remains inpatient appropriate because:Ongoing diagnostic testing needed not appropriate for outpatient work up, Unsafe d/c plan, IV treatments appropriate due to intensity of illness or inability to take PO and Inpatient level of care appropriate due to severity of illness   Dispo:  Patient From: Home  Planned Disposition: Skilled Nursing Facility  Medically stable for discharge: No      Consultants:   Vascular Surgery  Procedures:   Adominal aortogram, 08/04/2020, Dr. Myra Gianotti  Left BKA, 08/05/2020, Dr. Myra Gianotti  Antimicrobials:   Vancomycin 4/22>>  Cefepime 4/24>>  Zosyn 4/22 - 4/24    Subjective: Patient seen and examined bedside, resting comfortably.  Complaining of pain to his left lower extremity stump.  Underwent BKA yesterday.  No family present at bedside this morning.  No other questions or concerns at this time.  Denies headache, no chest pain, palpitations, no shortness of breath, no abdominal pain.  No acute events overnight per nursing staff.  Objective: Vitals:   08/06/20 0322 08/06/20 0809 08/06/20 1100 08/06/20 1105  BP: 113/77 106/79 117/82 114/83  Pulse: (!) 103  (!) 119 (!) 112  Resp: 20  18 18   Temp: 98.4 F (36.9 C) 98.2 F (36.8 C)  99.1 F (37.3 C)  TempSrc: Oral Oral  Oral  SpO2: 100% 97% 100%   Weight:      Height:        Intake/Output Summary (Last 24 hours) at 08/06/2020 1239 Last data filed at 08/05/2020 1550 Gross per 24 hour  Intake 1350 ml  Output 800 ml  Net 550 ml   Filed Weights   07/31/20 1300 07/31/20 2135  Weight: 68.9 kg 67.3 kg    Examination:  General exam: Appears calm and comfortable, appears older than stated age Respiratory system: Clear to auscultation. Respiratory effort normal.  On room air Cardiovascular system: S1 & S2 heard, RRR. No JVD, murmurs, rubs, gallops or clicks. No pedal edema. Gastrointestinal system: Abdomen is nondistended, soft and nontender. No  organomegaly or masses felt. Normal bowel sounds heard. Central nervous system: Alert and oriented. No focal neurological deficits. Extremities: Moves all extremities independently, muscle strength globally intact, noted left BKA site with dressing in place Skin: Left lower extremity with surgical dressing in place, clean/dry/intact Psychiatry: Judgement and insight appear poor. Mood & affect appropriate.           Data Reviewed: I have personally reviewed following labs and imaging studies  CBC: Recent Labs  Lab 07/31/20 1036 08/01/20 0224 08/02/20 0105 08/03/20 0615 08/04/20 0203 08/05/20 0218 08/06/20 0144  WBC 8.5   < > 6.5 7.8 7.9 7.5 8.5  NEUTROABS 5.0  --   --   --   --   --   --   HGB 10.5*   < > 9.3* 9.8* 9.6* 8.5* 8.1*  HCT 32.2*   < > 29.4* 30.8* 30.3* 27.1* 25.7*  MCV 83.9   < > 85.0 84.2 84.6 85.8 86.2  PLT 247   < > 192 188 203 204 225   < > = values in this interval not displayed.   Basic Metabolic Panel: Recent Labs  Lab 08/02/20 0105 08/03/20 0615 08/04/20 0203 08/05/20 0218 08/06/20 0144  NA 132* 133* 135 136 135  K 3.2* 3.6 3.8 3.6 3.8  CL 97* 100 99 102 103  CO2 30 24 29 25 25   GLUCOSE 99 96 117* 107* 133*  BUN <  5* <5* <5* <5* 7  CREATININE 0.55* 0.44* 0.60* 0.51* 0.53*  CALCIUM 8.0* 8.3* 8.7* 8.2* 7.9*  MG  --  1.7  --   --  1.7  PHOS  --  3.6  --   --   --    GFR: Estimated Creatinine Clearance: 102.8 mL/min (A) (by C-G formula based on SCr of 0.53 mg/dL (L)). Liver Function Tests: Recent Labs  Lab 07/31/20 1036  AST 26  ALT 14  ALKPHOS 58  BILITOT 0.4  PROT 8.6*  ALBUMIN 2.3*   No results for input(s): LIPASE, AMYLASE in the last 168 hours. No results for input(s): AMMONIA in the last 168 hours. Coagulation Profile: Recent Labs  Lab 08/05/20 0218  INR 1.1   Cardiac Enzymes: No results for input(s): CKTOTAL, CKMB, CKMBINDEX, TROPONINI in the last 168 hours. BNP (last 3 results) No results for input(s): PROBNP in the  last 8760 hours. HbA1C: No results for input(s): HGBA1C in the last 72 hours. CBG: No results for input(s): GLUCAP in the last 168 hours. Lipid Profile: No results for input(s): CHOL, HDL, LDLCALC, TRIG, CHOLHDL, LDLDIRECT in the last 72 hours. Thyroid Function Tests: No results for input(s): TSH, T4TOTAL, FREET4, T3FREE, THYROIDAB in the last 72 hours. Anemia Panel: No results for input(s): VITAMINB12, FOLATE, FERRITIN, TIBC, IRON, RETICCTPCT in the last 72 hours. Sepsis Labs: Recent Labs  Lab 07/31/20 1042 07/31/20 2152  LATICACIDVEN 1.6 1.1    Recent Results (from the past 240 hour(s))  Resp Panel by RT-PCR (Flu A&B, Covid) Nasopharyngeal Swab     Status: None   Collection Time: 07/31/20 10:36 AM   Specimen: Nasopharyngeal Swab; Nasopharyngeal(NP) swabs in vial transport medium  Result Value Ref Range Status   SARS Coronavirus 2 by RT PCR NEGATIVE NEGATIVE Final    Comment: (NOTE) SARS-CoV-2 target nucleic acids are NOT DETECTED.  The SARS-CoV-2 RNA is generally detectable in upper respiratory specimens during the acute phase of infection. The lowest concentration of SARS-CoV-2 viral copies this assay can detect is 138 copies/mL. A negative result does not preclude SARS-Cov-2 infection and should not be used as the sole basis for treatment or other patient management decisions. A negative result may occur with  improper specimen collection/handling, submission of specimen other than nasopharyngeal swab, presence of viral mutation(s) within the areas targeted by this assay, and inadequate number of viral copies(<138 copies/mL). A negative result must be combined with clinical observations, patient history, and epidemiological information. The expected result is Negative.  Fact Sheet for Patients:  BloggerCourse.com  Fact Sheet for Healthcare Providers:  SeriousBroker.it  This test is no t yet approved or cleared by the  [place] FDA and  has been authorized for detection and/or diagnosis of SARS-CoV-2 by FDA under an Emergency Use Authorization (EUA). This EUA will remain  in effect (meaning this test can be used) for the duration of the COVID-19 declaration under Section 564(b)(1) of the Act, 21 U.S.C.section 360bbb-3(b)(1), unless the authorization is terminated  or revoked sooner.       Influenza A by PCR NEGATIVE NEGATIVE Final   Influenza B by PCR NEGATIVE NEGATIVE Final    Comment: (NOTE) The Xpert Xpress SARS-CoV-2/FLU/RSV plus assay is intended as an aid in the diagnosis of influenza from Nasopharyngeal swab specimens and should not be used as a sole basis for treatment. Nasal washings and aspirates are unacceptable for Xpert Xpress SARS-CoV-2/FLU/RSV testing.  Fact Sheet for Patients: BloggerCourse.com  Fact Sheet for Healthcare Providers: SeriousBroker.it  This  test is not yet approved or cleared by the [place] FDA and has been authorized for detection and/or diagnosis of SARS-CoV-2 by FDA under an Emergency Use Authorization (EUA). This EUA will remain in effect (meaning this test can be used) for the duration of the COVID-19 declaration under Section 564(b)(1) of the Act, 21 U.S.C. section 360bbb-3(b)(1), unless the authorization is terminated or revoked.  Performed at [facility], [address] [place], [place] [address]   Culture, blood (routine x 2)     Status: None   Collection Time: 08/01/20 11:57 AM   Specimen: BLOOD  Result Value Ref Range Status   Specimen Description BLOOD RIGHT ANTECUBITAL  Final   Special Requests   Final    BOTTLES DRAWN AEROBIC AND ANAEROBIC Blood Culture adequate volume   Culture   Final    NO GROWTH 5 DAYS Performed at [facility], [address] [place], [place] [address]    Report Status 08/06/2020 FINAL  Final  Culture, blood (routine x 2)     Status: None    Collection Time: 08/01/20 12:04 PM   Specimen: BLOOD LEFT HAND  Result Value Ref Range Status   Specimen Description BLOOD LEFT HAND  Final   Special Requests   Final    BOTTLES DRAWN AEROBIC AND ANAEROBIC Blood Culture results may not be optimal due to an inadequate volume of blood received in culture bottles   Culture   Final    NO GROWTH 5 DAYS Performed at [facility], [address] [place], [place] [address]    Report Status 08/06/2020 FINAL  Final  Surgical PCR screen     Status: Abnormal   Collection Time: 08/04/20  7:47 AM   Specimen: Nasal Mucosa; Nasal Swab  Result Value Ref Range Status   MRSA, PCR NEGATIVE NEGATIVE Final   Staphylococcus aureus POSITIVE (A) NEGATIVE Final    Comment: (NOTE) The Xpert SA Assay (FDA approved for NASAL specimens in patients 53 years of age and older), is one component of a comprehensive surveillance program. It is not intended to diagnose infection nor to guide or monitor treatment. Performed at [facility], [address] [place], [place] [address]          Radiology Studies: PERIPHERAL VASCULAR CATHETERIZATION  Result Date: 08/04/2020   Patient name: Rosealee AlbeeDwight L Gubser        MRN: [id number]        DOB: 06/28/1965            Sex: male  08/04/2020 Pre-operative Diagnosis: Left foot gangrene Post-operative diagnosis:  Same Surgeon:  Durene CalWells Brabham Procedure Performed:             1.  Ultrasound-guided access, right femoral artery             2.  Abdominal aortogram             3.  Second-order catheterization             4.  Bilateral lower extremity runoff             5.  Conscious sedation, 35 minutes   Indications: This is a 55 year old male with a gangrenous left foot.  He comes in for arterial evaluation and possible intervention  Procedure:  The patient was identified in the holding area and taken to room 8.  The patient was then placed supine on the table and prepped and draped in the usual  sterile fashion.  A time  out was called.  Conscious sedation was administered with the use of IV fentanyl and Versed under continuous physician and nurse monitoring.  Heart rate, blood pressure, and oxygen saturation were continuously monitored.  Total sedation time was 115 minutes.  Ultrasound was used to evaluate the right common femoral artery.  It was patent .  A digital ultrasound image was acquired.  A micropuncture needle was used to access the right common femoral artery under ultrasound guidance.  An 018 wire was advanced without resistance and a micropuncture sheath was placed.  The 018 wire was removed and a benson wire was placed.  The micropuncture sheath was exchanged for a 5 french sheath.  An omniflush catheter was advanced over the wire to the level of L-1.  An abdominal angiogram was obtained.  Next, using the omniflush catheter and a benson wire, the aortic bifurcation was crossed and the catheter was placed into theleft external iliac artery and left runoff was obtained.  right runoff was performed via retrograde sheath injections.  Findings:             Aortogram: No significant renal artery stenosis was identified.  The infrarenal abdominal aorta was widely patent.  Bilateral common and external iliac arteries are widely patent.             Right Lower Extremity: The right common femoral profundofemoral, and superficial femoral arteries are widely patent without stenosis.  The popliteal artery is widely patent.  There is single-vessel runoff via the peroneal artery             Left Lower Extremity: The left common femoral and profundofemoral artery are widely patent without stenosis.  The superficial femoral artery is patent down to the adductor canal where it occludes.  Through geniculate collaterals, the peroneal artery reconstitutes at its origin.  It does collateralized a posterior tibial artery at the ankle which supplies the foot  Intervention: None  Impression:             #1  No significant aortoiliac  occlusive disease             #2  On the right, there was no outflow stenosis.  There is single-vessel runoff via the peroneal artery             #3  On the left, the superficial femoral artery is patent down to the adductor canal where it occludes.  There is reconstitution of the peroneal artery which is patent down to the ankle where it collateralizes to a posterior tibial artery    V. Durene Cal, M.D., [facility] [place] Office: [phone] Pager:  [phone]       Scheduled Meds: . aspirin EC  325 mg Oral Daily  . atorvastatin  40 mg Oral Daily  . enoxaparin (LOVENOX) injection  40 mg Subcutaneous Q24H  . folic acid  1 mg Oral Daily  . ketorolac  30 mg Intravenous Q6H  . multivitamin with minerals  1 tablet Oral Daily  . mupirocin ointment  1 application Nasal BID  . nicotine  21 mg Transdermal Daily  . senna-docusate  2 tablet Oral BID  . sodium chloride flush  3 mL Intravenous Q12H  . thiamine  100 mg Oral Daily   Or  . thiamine  100 mg Intravenous Daily   Continuous Infusions: . sodium chloride 250 mL (08/05/20 2120)  . ceFEPime (MAXIPIME) IV 2 g (08/06/20 0622)  .  vancomycin 1,250 mg (08/06/20 0213)     LOS: 6 days    Time spent: 38 minutes spent on chart review, discussion with nursing staff, consultants, updating family and interview/physical exam; more than 50% of that time was spent in counseling and/or coordination of care.    Alvira Philips [place], DO [facility] Available via Epic secure chat 7am-7pm After these hours, please refer to coverage provider listed on [email or web address] 08/06/2020, 12:39 PM

## 2020-08-06 NOTE — Progress Notes (Signed)
Mobility Specialist: Progress Note   08/06/20 1555  Mobility  Activity Ambulated in hall  Level of Assistance Contact guard assist, steadying assist  Assistive Device Front wheel walker  Distance Ambulated (ft) 140 ft  Mobility Response Tolerated well  Mobility performed by Mobility specialist  $Mobility charge 1 Mobility   Pre-Mobility: 110 HR, 98% SpO2 Post-Mobility: 134 HR  Pt c/o 7-8/10 pain in L stump during ambulation. Pt stopped frequently due to hands cramping while using RW. Pt said his hands cramped while working with PT earlier today as well. Pt sitting EOB with call bell at his side and is requesting pain medication, RN notified.   [facility] Darell Saputo Mobility Specialist Mobility Specialist Phone: [phone]

## 2020-08-06 NOTE — Plan of Care (Signed)

## 2020-08-07 LAB — CBC
HCT: 24.3 % — ABNORMAL LOW (ref 39.0–52.0)
Hemoglobin: 7.8 g/dL — ABNORMAL LOW (ref 13.0–17.0)
MCH: 27.2 pg (ref 26.0–34.0)
MCHC: 32.1 g/dL (ref 30.0–36.0)
MCV: 84.7 fL (ref 80.0–100.0)
Platelets: 264 10*3/uL (ref 150–400)
RBC: 2.87 MIL/uL — ABNORMAL LOW (ref 4.22–5.81)
RDW: 14.8 % (ref 11.5–15.5)
WBC: 9.8 10*3/uL (ref 4.0–10.5)
nRBC: 0 % (ref 0.0–0.2)

## 2020-08-07 LAB — SURGICAL PATHOLOGY

## 2020-08-07 LAB — BASIC METABOLIC PANEL
Anion gap: 7 (ref 5–15)
BUN: 6 mg/dL (ref 6–20)
CO2: 26 mmol/L (ref 22–32)
Calcium: 7.8 mg/dL — ABNORMAL LOW (ref 8.9–10.3)
Chloride: 101 mmol/L (ref 98–111)
Creatinine, Ser: 0.59 mg/dL — ABNORMAL LOW (ref 0.61–1.24)
GFR, Estimated: >60 mL/min (ref >60)
Glucose, Bld: 133 mg/dL — ABNORMAL HIGH (ref 70–99)
Potassium: 3.2 mmol/L — ABNORMAL LOW (ref 3.5–5.1)
Sodium: 134 mmol/L — ABNORMAL LOW (ref 135–145)

## 2020-08-07 LAB — MAGNESIUM: Magnesium: 1.7 mg/dL (ref 1.7–2.4)

## 2020-08-07 MED ORDER — MAGNESIUM SULFATE 2 GM/50ML IV SOLN
2.0000 g | Freq: Once | INTRAVENOUS | Status: AC
  Administered 2020-08-07: 2 g via INTRAVENOUS
  Filled 2020-08-07: qty 50

## 2020-08-07 MED ORDER — POTASSIUM CHLORIDE CRYS ER 20 MEQ PO TBCR
40.0000 meq | EXTENDED_RELEASE_TABLET | ORAL | Status: AC
  Administered 2020-08-07 (×2): 40 meq via ORAL
  Filled 2020-08-07 (×2): qty 2

## 2020-08-07 NOTE — Progress Notes (Signed)
OT Cancellation Note  Patient Details Name: Jake Nguyen MRN: [id number] DOB: 28-Jan-1966   Cancelled Treatment:    Reason Eval/Treat Not Completed: Patient declined, on the phone and asking for OT to return.  Continue efforts in the acute setting.    Farren Landa D Eriq Hufford 08/07/2020, 3:10 PM

## 2020-08-07 NOTE — Progress Notes (Addendum)
  Progress Note    08/07/2020 7:34 AM 2 Days Post-Op  Subjective: Pain in stump this morning   Vitals:   08/07/20 0200 08/07/20 0300  BP:  114/77  Pulse:  (!) 118  Resp:  20  Temp: 98 F (36.7 C) 100.2 F (37.9 C)  SpO2:  98%    Physical Exam: Incisions:  Some serosanguinous drainage from mid incision  CBC    Component Value Date/Time   WBC 9.8 08/07/2020 0124   RBC 2.87 (L) 08/07/2020 0124   HGB 7.8 (L) 08/07/2020 0124   HCT 24.3 (L) 08/07/2020 0124   HCT 36.4 (L) 05/20/2018 1923   PLT 264 08/07/2020 0124   MCV 84.7 08/07/2020 0124   MCH 27.2 08/07/2020 0124   MCHC 32.1 08/07/2020 0124   RDW 14.8 08/07/2020 0124   LYMPHSABS 2.7 07/31/2020 1036   MONOABS 0.7 07/31/2020 1036   EOSABS 0.0 07/31/2020 1036   BASOSABS 0.1 07/31/2020 1036    BMET    Component Value Date/Time   NA 134 (L) 08/07/2020 0124   K 3.2 (L) 08/07/2020 0124   CL 101 08/07/2020 0124   CO2 26 08/07/2020 0124   GLUCOSE 133 (H) 08/07/2020 0124   BUN 6 08/07/2020 0124   CREATININE 0.59 (L) 08/07/2020 0124   CALCIUM 7.8 (L) 08/07/2020 0124   GFRNONAA >60 08/07/2020 0124   GFRAA >60 11/12/2019 0359    INR    Component Value Date/Time   INR 1.1 08/05/2020 0218     Intake/Output Summary (Last 24 hours) at 08/07/2020 0734 Last data filed at 08/07/2020 0557 Gross per 24 hour  Intake --  Output 1950 ml  Net -1950 ml     Assessment/Plan:  55 y.o. male is s/p left below knee amputation  2 Days Post-Op  - L BKA dressing changed; continue dressing changes with 4x4, kerlix, and ACE - PT recommending SNF - Ok for d/c when bed available; office will arrange staple removal in 4-6 weeks  [name], PA-C [facility] [phone] 08/07/2020 7:34 AM   VASCULAR STAFF ADDENDUM: I have independently interviewed and examined the patient. I agree with the above.  Looks good status post below-knee amputation. PT/OT/OOB As needed pain control Disposition planning:  SNF or inpatient rehab  Rande Brunt. Lenell Antu, MD [facility] Phone Number: [phone] 08/07/2020 8:03 AM

## 2020-08-07 NOTE — Progress Notes (Addendum)
PROGRESS NOTE    Jake Nguyen  ZOX:[id number] DOB: 11/02/1965 DOA: 07/31/2020 PCP: Patient, No Pcp Per (Inactive)    Brief Narrative:  Jake Nguyen is a 55 year old male with past medical history significant for EtOH abuse, left foot gangrene who [facility] with worsening left foot pain.  Patient developed left foot gangrene about 1 month ago, evaluated [facility] with plan for vascular surgery evaluation; however patient left AMA.  He reports being wheelchair-bound for past 2 months because of an bearable left foot pain.  In the ED, his left foot was noted to be with gangrenous changes, was tachycardic without fever.  Lactic acid within normal limits.  WBC within normal limits.  Cova-19 PCR/influenza A/B PCR negative.  Left foot x-ray with progression of soft tissue gas compatible with infection, negative for osteomyelitis.  Vascular surgery consulted.  Hospital service consulted for further evaluation and management.   Assessment & Plan:   Active Problems:   Alcohol abuse with alcohol-induced mood disorder (HCC)   Gangrene of foot (HCC)   Gangrene (HCC)   Left foot gangrene, severe Patient presenting with progressive/severe left foot pain.  Diagnosed with gangrene left foot roughly 1 month ago.  Has been wheelchair-bound for roughly 2 months.  X-ray left foot 07/31/2020 with progression of soft tissue gas compatible with infection, negative for osteomyelitis.  ABI completed with evidence of severe PAD affecting left lower extremity.  Underwent abdominal aortogram by vascular surgery, Dr. Myra GianottiBrabham on 08/04/2020 with findings of patent superficial femoral artery down to adductor canal where it occludes with reconstitution of the peroneal artery which is patent down to the ankle where it collateralizes well posterior tibial artery.  Underwent BKA by vascular surgery, Dr. Myra GianottiBrabham on 08/05/2020. --Vascular surgery following, appreciate assistance --Toradol 30mg   IV q6h x 3 days --Percocet 5-325mg  1-2 tabs q4h prn moderate pain --Morphine 2mg  IV q2h prn severe pain --PT recommend SNF, social work for placement  Peripheral artery disease Significant peripheral vascular disease noted on ABI and abdominal aortogram.  LDL 51 07/31/2020. --Atorvastatin 40 mg p.o. daily --Aspirin 81 mg p.o. daily  Anxiety: Gabapentin 100 mg p.o. 3 times daily as needed  Insomnia: Seroquel 25 mg p.o. nightly as needed  Hypovolemic hyponatremia: Resolved Patient presented with a sodium 132, improved with IV fluid hydration.  Hypomagnesemia Magnesium 1.7 today, will replete --Repeat electrolytes in a.m.  Hypokalemia: Resolved Potassium 3.2 today, will replete --Repeat electrolytes in the a.m.  Chronic blood loss anemia in the setting of left foot gangrene Baseline hemoglobin around 12.  No overt bleeding. --Hgb 10.5>>8.5>8.1 --repeat CBC in am  EtOH use disorder Counseled on need for complete cessation. --CIWAA protocol --Scheduled Valium taper --Multivitamin, thiamine, folic acid --Outpatient substance abuse treatment  Tobacco use disorder Counseled on need for complete cessation.   --Nicotine patch  Mood disorder Patient was initially placed under IVC for concern of possible suicidal ideation.  Was seen by psychiatry 07/29/2020; and was deemed not an imminent risk to self or others at present and does not meet criteria for psychiatric inpatient admission.  Etiology of his initial statements likely related to severe pain, infection and gangrene of foot with confounding factor of EtOH binge/use disorder --Discontinued IVC and sitter on 4/27  Ambulatory dysfunction Seen by PT, recommending SNF.  TOC for placement --continue PT/OT efforts while inpatient   DVT prophylaxis: Lovenox   Code Status: Full Code Family Communication:  updated patient's brother present at bedside this morning.  Disposition Plan:  Level of care: Telemetry Medical Status is:  Inpatient  Remains inpatient appropriate because:Ongoing diagnostic testing needed not appropriate for outpatient work up, Unsafe d/c plan, IV treatments appropriate due to intensity of illness or inability to take PO and Inpatient level of care appropriate due to severity of illness   Dispo:  Patient From: Home  Planned Disposition: Skilled Nursing Facility  Medically stable for discharge: No      Consultants:   Vascular Surgery  Procedures:   Adominal aortogram, 08/04/2020, Dr. Myra Gianotti  Left BKA, 08/05/2020, Dr. Myra Gianotti  Antimicrobials:   Vancomycin 4/22>>  Cefepime 4/24 - 4/28  Zosyn 4/22 - 4/28    Subjective: Patient seen and examined bedside, resting comfortably.  Complaining of pain to his left lower extremity stump.  Brother present at bedside this morning.  No other questions or concerns at this time.  Denies headache, no chest pain, palpitations, no shortness of breath, no abdominal pain.  No acute events overnight per nursing staff.  SNF placement.  Objective: Vitals:   08/07/20 0300 08/07/20 0800 08/07/20 0823 08/07/20 1027  BP: 114/77 114/75    Pulse: (!) 118     Resp: 20 19  20   Temp: 100.2 F (37.9 C)  97.8 F (36.6 C)   TempSrc: Oral  Oral   SpO2: 98%     Weight:      Height:        Intake/Output Summary (Last 24 hours) at 08/07/2020 1106 Last data filed at 08/07/2020 1055 Gross per 24 hour  Intake 250 ml  Output 2400 ml  Net -2150 ml   Filed Weights   07/31/20 1300 07/31/20 2135  Weight: 68.9 kg 67.3 kg    Examination:  General exam: Appears calm and comfortable, appears older than stated age Respiratory system: Clear to auscultation. Respiratory effort normal.  On room air Cardiovascular system: S1 & S2 heard, RRR. No JVD, murmurs, rubs, gallops or clicks. No pedal edema. Gastrointestinal system: Abdomen is nondistended, soft and nontender. No organomegaly or masses felt. Normal bowel sounds heard. Central nervous system: Alert and  oriented. No focal neurological deficits. Extremities: Moves all extremities independently, muscle strength globally intact, noted left BKA site with dressing/Ace wrap in place Skin: Left lower extremity with surgical dressing/Ace wrap in place, clean/dry/intact Psychiatry: Judgement and insight appear poor. Mood & affect appropriate.    Data Reviewed: I have personally reviewed following labs and imaging studies  CBC: Recent Labs  Lab 08/03/20 0615 08/04/20 0203 08/05/20 0218 08/06/20 0144 08/07/20 0124  WBC 7.8 7.9 7.5 8.5 9.8  HGB 9.8* 9.6* 8.5* 8.1* 7.8*  HCT 30.8* 30.3* 27.1* 25.7* 24.3*  MCV 84.2 84.6 85.8 86.2 84.7  PLT 188 203 204 225 264   Basic Metabolic Panel: Recent Labs  Lab 08/03/20 0615 08/04/20 0203 08/05/20 0218 08/06/20 0144 08/07/20 0124 08/07/20 0750  NA 133* 135 136 135 134*  --   K 3.6 3.8 3.6 3.8 3.2*  --   CL 100 99 102 103 101  --   CO2 24 29 25 25 26   --   GLUCOSE 96 117* 107* 133* 133*  --   BUN <5* <5* <5* 7 6  --   CREATININE 0.44* 0.60* 0.51* 0.53* 0.59*  --   CALCIUM 8.3* 8.7* 8.2* 7.9* 7.8*  --   MG 1.7  --   --  1.7  --  1.7  PHOS 3.6  --   --   --   --   --  GFR: Estimated Creatinine Clearance: 102.8 mL/min (A) (by C-G formula based on SCr of 0.59 mg/dL (L)). Liver Function Tests: No results for input(s): AST, ALT, ALKPHOS, BILITOT, PROT, ALBUMIN in the last 168 hours. No results for input(s): LIPASE, AMYLASE in the last 168 hours. No results for input(s): AMMONIA in the last 168 hours. Coagulation Profile: Recent Labs  Lab 08/05/20 0218  INR 1.1   Cardiac Enzymes: No results for input(s): CKTOTAL, CKMB, CKMBINDEX, TROPONINI in the last 168 hours. BNP (last 3 results) No results for input(s): PROBNP in the last 8760 hours. HbA1C: No results for input(s): HGBA1C in the last 72 hours. CBG: No results for input(s): GLUCAP in the last 168 hours. Lipid Profile: No results for input(s): CHOL, HDL, LDLCALC, TRIG, CHOLHDL,  LDLDIRECT in the last 72 hours. Thyroid Function Tests: No results for input(s): TSH, T4TOTAL, FREET4, T3FREE, THYROIDAB in the last 72 hours. Anemia Panel: No results for input(s): VITAMINB12, FOLATE, FERRITIN, TIBC, IRON, RETICCTPCT in the last 72 hours. Sepsis Labs: Recent Labs  Lab 07/31/20 2152  LATICACIDVEN 1.1    Recent Results (from the past 240 hour(s))  Resp Panel by RT-PCR (Flu A&B, Covid) Nasopharyngeal Swab     Status: None   Collection Time: 07/31/20 10:36 AM   Specimen: Nasopharyngeal Swab; Nasopharyngeal(NP) swabs in vial transport medium  Result Value Ref Range Status   SARS Coronavirus 2 by RT PCR NEGATIVE NEGATIVE Final    Comment: (NOTE) SARS-CoV-2 target nucleic acids are NOT DETECTED.  The SARS-CoV-2 RNA is generally detectable in upper respiratory specimens during the acute phase of infection. The lowest concentration of SARS-CoV-2 viral copies this assay can detect is 138 copies/mL. A negative result does not preclude SARS-Cov-2 infection and should not be used as the sole basis for treatment or other patient management decisions. A negative result may occur with  improper specimen collection/handling, submission of specimen other than nasopharyngeal swab, presence of viral mutation(s) within the areas targeted by this assay, and inadequate number of viral copies(<138 copies/mL). A negative result must be combined with clinical observations, patient history, and epidemiological information. The expected result is Negative.  Fact Sheet for Patients:  BloggerCourse.com  Fact Sheet for Healthcare Providers:  SeriousBroker.it  This test is no t yet approved or cleared by the [place] FDA and  has been authorized for detection and/or diagnosis of SARS-CoV-2 by FDA under an Emergency Use Authorization (EUA). This EUA will remain  in effect (meaning this test can be used) for the duration of  the COVID-19 declaration under Section 564(b)(1) of the Act, 21 U.S.C.section 360bbb-3(b)(1), unless the authorization is terminated  or revoked sooner.       Influenza A by PCR NEGATIVE NEGATIVE Final   Influenza B by PCR NEGATIVE NEGATIVE Final    Comment: (NOTE) The Xpert Xpress SARS-CoV-2/FLU/RSV plus assay is intended as an aid in the diagnosis of influenza from Nasopharyngeal swab specimens and should not be used as a sole basis for treatment. Nasal washings and aspirates are unacceptable for Xpert Xpress SARS-CoV-2/FLU/RSV testing.  Fact Sheet for Patients: BloggerCourse.com  Fact Sheet for Healthcare Providers: SeriousBroker.it  This test is not yet approved or cleared by the [place] FDA and has been authorized for detection and/or diagnosis of SARS-CoV-2 by FDA under an Emergency Use Authorization (EUA). This EUA will remain in effect (meaning this test can be used) for the duration of the COVID-19 declaration under Section 564(b)(1) of the Act, 21 U.S.C. section 360bbb-3(b)(1), unless the authorization is  terminated or revoked.  Performed at [facility], [address] [place], [place] [address]   Culture, blood (routine x 2)     Status: None   Collection Time: 08/01/20 11:57 AM   Specimen: BLOOD  Result Value Ref Range Status   Specimen Description BLOOD RIGHT ANTECUBITAL  Final   Special Requests   Final    BOTTLES DRAWN AEROBIC AND ANAEROBIC Blood Culture adequate volume   Culture   Final    NO GROWTH 5 DAYS Performed at [facility], [address], [place], [place] [address]    Report Status 08/06/2020 FINAL  Final  Culture, blood (routine x 2)     Status: None   Collection Time: 08/01/20 12:04 PM   Specimen: BLOOD LEFT HAND  Result Value Ref Range Status   Specimen Description BLOOD LEFT HAND  Final   Special Requests   Final    BOTTLES DRAWN AEROBIC AND ANAEROBIC Blood Culture  results may not be optimal due to an inadequate volume of blood received in culture bottles   Culture   Final    NO GROWTH 5 DAYS Performed at [facility], [address], [place], [place] [address]    Report Status 08/06/2020 FINAL  Final  Surgical PCR screen     Status: Abnormal   Collection Time: 08/04/20  7:47 AM   Specimen: Nasal Mucosa; Nasal Swab  Result Value Ref Range Status   MRSA, PCR NEGATIVE NEGATIVE Final   Staphylococcus aureus POSITIVE (A) NEGATIVE Final    Comment: (NOTE) The Xpert SA Assay (FDA approved for NASAL specimens in patients 64 years of age and older), is one component of a comprehensive surveillance program. It is not intended to diagnose infection nor to guide or monitor treatment. Performed at [facility], [address], [place], [place] [address]          Radiology Studies: No results found.      Scheduled Meds: . aspirin EC  325 mg Oral Daily  . atorvastatin  40 mg Oral Daily  . enoxaparin (LOVENOX) injection  40 mg Subcutaneous Q24H  . folic acid  1 mg Oral Daily  . ketorolac  30 mg Intravenous Q6H  . multivitamin with minerals  1 tablet Oral Daily  . mupirocin ointment  1 application Nasal BID  . nicotine  21 mg Transdermal Daily  . potassium chloride  40 mEq Oral Q3H  . senna-docusate  2 tablet Oral BID  . sodium chloride flush  3 mL Intravenous Q12H  . thiamine  100 mg Oral Daily   Or  . thiamine  100 mg Intravenous Daily   Continuous Infusions: . sodium chloride 250 mL (08/05/20 2120)  . magnesium sulfate bolus IVPB 2 g (08/07/20 1050)     LOS: 7 days    Time spent: 36 minutes spent on chart review, discussion with nursing staff, consultants, updating family and interview/physical exam; more than 50% of that time was spent in counseling and/or coordination of care.    Alvira Philips [place], DO [facility] Available via Epic secure chat 7am-7pm After these hours, please refer to coverage provider  listed on [email or web address] 08/07/2020, 11:06 AM

## 2020-08-07 NOTE — Progress Notes (Signed)
   08/07/20 1320  Clinical Encounter Type  Visited With Patient and family together  Visit Type Spiritual support  Referral From Nurse  Consult/Referral To Chaplain  Chaplain responded to request. The patient had visitors, his brother and two childhood friends. They talked about their forty year friendship and growing up together in [place] neighborhood. Prayer was offered. This chaplain advised the patient I could return if needed and needed to inform his nurse. This note was prepared by Deneen Harts, M.Div..  For questions please contact by phone [phone].

## 2020-08-07 NOTE — Progress Notes (Signed)
Mobility Specialist: Progress Note   08/07/20 1513  Mobility  Activity Ambulated in hall  Level of Assistance Contact guard assist, steadying assist  Assistive Device Front wheel walker  Distance Ambulated (ft) 140 ft  Mobility Response Tolerated fair  Mobility performed by Mobility specialist  $Mobility charge 1 Mobility   Pre-Mobility: 104 HR During Mobility: 145 HR Post-Mobility: 106 HR, 119/81 BP  Pt c/o hands cramping during ambulation again today. Pt took frequent brief standing breaks to take his hands off the RW due to hands cramping.  Pt c/o pain in L stump after returning to room, no rating given. Pt requesting pain medication, RN notified. Pt back in bed with pt's brother present in room.   [facility] Shakeita Vandevander Mobility Specialist Mobility Specialist Phone: [phone]

## 2020-08-07 NOTE — TOC Initial Note (Signed)
Transition of Care [facility]) - Initial/Assessment Note    Patient Details  Name: Jake Nguyen MRN: [id number] Date of Birth: May 19, 1965  Transition of Care [facility]) CM/SW Contact:    Vinie Sill, LCSW Phone Number: 08/07/2020, 4:40 PM  Clinical Narrative:                  CSW met with patient at bedside, along with his brother. CSW introduced self and explained role. CSW discussed PT recommendation of short term rehab at [facility]. Patient states he was residing in a boarding house and was working prior to being admitted. He has been informed, the boarding house will be closed effective on Monday. CSW encourage the patient and his brother to talk with boarding house manager to confirm housing situation and to explore other housing options. CSW explained the SNF process and possible barriers ( no insurance, ETOH, homeless, no income) with placement. Patient states no income and no service in the [employer]. Patient has not received covid vaccines and currently not interested in receiving the vaccine at this time.   CSW called patient's sister,Cheryl and updated on placement and barriers as requested.  Patient was referred on 04/27- to [employer] to screen for Medicaid.  TOC will continue to follow and assist with discharge planning.   Expected Discharge Plan: Skilled Nursing Facility Barriers to Discharge: Inadequate or no insurance,Continued Medical Work up,SNF Pending bed offer   Patient Goals and CMS Choice        Expected Discharge Plan and Services Expected Discharge Plan: [place] In-house Referral: Clinical Social Work     Living arrangements for the past 2 months: [place]                                      Prior Living Arrangements/Services Living arrangements for the past 2 months: [place] with:: Self Patient language and need for interpreter reviewed:: No        Need for Family Participation in Patient Care: Yes  (Comment) Care giver support system in place?: Yes (comment)   Criminal Activity/Legal Involvement Pertinent to Current Situation/Hospitalization: No - Comment as needed  Activities of Daily Living Home Assistive Devices/Equipment: None ADL Screening (condition at time of admission) Patient's cognitive ability adequate to safely complete daily activities?: Yes Is the patient deaf or have difficulty hearing?: No Does the patient have difficulty seeing, even when wearing glasses/contacts?: No Does the patient have difficulty concentrating, remembering, or making decisions?: No Patient able to express need for assistance with ADLs?: Yes Does the patient have difficulty dressing or bathing?: No Independently performs ADLs?: Yes (appropriate for developmental age) Does the patient have difficulty walking or climbing stairs?: Yes Weakness of Legs: Left Weakness of Arms/Hands: None  Permission Sought/Granted Permission sought to share information with : Family Supports Permission granted to share information with : Yes, Verbal Permission Granted  Share Information with NAME: Lajoyce Corners  Permission granted to share info w AGENCY: SNFs  Permission granted to share info w Relationship: sister  Permission granted to share info w Contact Information: [phone]  Emotional Assessment Appearance:: Appears older than stated age Attitude/Demeanor/Rapport: Engaged Affect (typically observed): Accepting,Pleasant,Appropriate Orientation: : Oriented to Self,Oriented to Place,Oriented to  Time,Oriented to Situation Alcohol / Substance Use: Alcohol Use Psych Involvement: No (comment)  Admission diagnosis:  Gangrene [facility]) [I96] Patient Active Problem List   Diagnosis Date Noted  . Gangrene (  [place]) 07/31/2020  . Gangrene of foot ([place]) 07/14/2020  . Ataxia   . Alcohol abuse with alcohol-induced mood disorder ([place]) 11/10/2019  . Alcoholism /alcohol abuse   . Alcohol withdrawal syndrome, with  delirium ([place]) 11/06/2019  . Pressure injury of skin 05/25/2018  . Rhabdomyolysis 05/20/2018  . Hyponatremia 05/20/2018  . Elevated troponin 05/20/2018  . Moderate dehydration 05/20/2018  . Alcohol use 05/20/2018  . Influenza A 05/20/2018  . Transaminitis 05/20/2018   PCP:  Patient, No Pcp Per (Inactive) Pharmacy:   [facility] [address] [place] [address] Phone: [phone] Fax: [phone]  [facility] [phone] [place], [place] - [address] [place] [address] Phone: [phone] Fax: [phone]     Social Determinants of Health (SDOH) Interventions    Readmission Risk Interventions No flowsheet data found.

## 2020-08-08 LAB — BASIC METABOLIC PANEL
Anion gap: 8 (ref 5–15)
BUN: 6 mg/dL (ref 6–20)
CO2: 26 mmol/L (ref 22–32)
Calcium: 7.8 mg/dL — ABNORMAL LOW (ref 8.9–10.3)
Chloride: 101 mmol/L (ref 98–111)
Creatinine, Ser: 0.61 mg/dL (ref 0.61–1.24)
GFR, Estimated: >60 mL/min (ref >60)
Glucose, Bld: 117 mg/dL — ABNORMAL HIGH (ref 70–99)
Potassium: 3.5 mmol/L (ref 3.5–5.1)
Sodium: 135 mmol/L (ref 135–145)

## 2020-08-08 LAB — MAGNESIUM: Magnesium: 1.9 mg/dL (ref 1.7–2.4)

## 2020-08-08 LAB — CBC
HCT: 22.2 % — ABNORMAL LOW (ref 39.0–52.0)
Hemoglobin: 7.2 g/dL — ABNORMAL LOW (ref 13.0–17.0)
MCH: 27.3 pg (ref 26.0–34.0)
MCHC: 32.4 g/dL (ref 30.0–36.0)
MCV: 84.1 fL (ref 80.0–100.0)
Platelets: 281 10*3/uL (ref 150–400)
RBC: 2.64 MIL/uL — ABNORMAL LOW (ref 4.22–5.81)
RDW: 15.2 % (ref 11.5–15.5)
WBC: 9.1 10*3/uL (ref 4.0–10.5)
nRBC: 0 % (ref 0.0–0.2)

## 2020-08-08 MED ORDER — POTASSIUM CHLORIDE CRYS ER 20 MEQ PO TBCR
40.0000 meq | EXTENDED_RELEASE_TABLET | Freq: Once | ORAL | Status: AC
  Administered 2020-08-08: 40 meq via ORAL
  Filled 2020-08-08: qty 2

## 2020-08-08 NOTE — Evaluation (Signed)
Physical Therapy Re-Evaluation Patient Details Name: Jake Nguyen MRN: [id number] DOB: 03-02-1966 Today's Date: 08/08/2020   History of Present Illness  55 y.o. male presents to [facility] ED on 07/31/2020 with worsening L foot pain. Pt with history of L foot gangrene for ~1 month, there were plans for vascular surgery at [facility] however pt left AMA. He had LLE angiogram 4/26.  Pt S/P left BKA 4/27.   PMH includes medical history significant of alcohol abuse, left foot gangrene.    Clinical Impression  Pt presents with condition above and deficits mentioned below, see PT Problem List. PTA, pt reports he was ambulating and independent with all functional mobility without AD/AE (does not appear to remember stating he used a w/c during initial PT eval) and living in a boarding home; suspect he was struggling with LB ADLs and functional transfers/mobility as condition of L foot deteriorated. Pt currently needs up to min guard assist with functional transfers and household distance gait utilizing a RW. Educated on importance of keeping L knee in extension and residual limb elevated to prevent contractures and manage edema, as it is likely limiting his knee flexion AROM. Educated pt on performing AROM knee flexion and quad sets as HEP. Complicated d/c planning considering insecure housing status, no insurance, and h/o ETOH abuse; appreciate SW's help with this. Pt would benefit from short-term therapy at a SNF for continued amputee rehab to maximize his safety and independence with all functional mobility. Will continue to follow acutely.    Follow Up Recommendations SNF;Supervision for mobility/OOB    Equipment Recommendations  Rolling walker with 5" wheels    Recommendations for Other Services       Precautions / Restrictions Precautions Precautions: Fall Precaution Comments: tachycardia, s/p L BKA Restrictions Other Position/Activity Restrictions: no order set addressing WB status but assuming LLE  NWB given recent BKA      Mobility  Bed Mobility Overal bed mobility: Needs Assistance Bed Mobility: Supine to Sit     Supine to sit: Supervision     General bed mobility comments: Pt able to transition supine > sit safely with supervision.    Transfers Overall transfer level: Needs assistance Equipment used: Rolling walker (2 wheeled) Transfers: Sit to/from Stand Sit to Stand: Min guard         General transfer comment: min guard from regular EOB height. min guard for safety. no LOB.  Ambulation/Gait Ambulation/Gait assistance: Min guard Gait Distance (Feet): 170 Feet Assistive device: Rolling walker (2 wheeled) Gait Pattern/deviations: Trunk flexed (swing-to) Gait velocity: reduced Gait velocity interpretation: <1.8 ft/sec, indicate of risk for recurrent falls General Gait Details: Pt with flexed posture, needs RW to be heightened. Pt relying heavily on UEs. Steady swing-to gait with no LOB, min guard for safety. x3 standing rest breaks due to UE fatigue.  Stairs            Wheelchair Mobility    Modified Rankin (Stroke Patients Only)       Balance Overall balance assessment: Needs assistance Sitting-balance support: No upper extremity supported;Feet supported Sitting balance-Leahy Scale: Good     Standing balance support: Bilateral upper extremity supported Standing balance-Leahy Scale: Poor Standing balance comment: reliant on the RW                             Pertinent Vitals/Pain Pain Assessment: Faces Faces Pain Scale: Hurts a little bit Pain Location: L residual limb Pain Descriptors / Indicators:  Discomfort;Grimacing;Guarding Pain Intervention(s): Limited activity within patient's tolerance;Monitored during session;Repositioned    Home Living Family/patient expects to be discharged to:: Unsure Living Arrangements: Alone Available Help at Discharge: Family;Other (Comment) (brother present on OT eval) Type of Home:  House Home Access: Stairs to enter   [employer] of Steps: 2-3 Home Layout: One level Home Equipment:  (pt reports not having a w/c) Additional Comments: per chart review and conversation with pt, housing status remains in question. Recently in a boarding house but per chart review that is closing on Monday. Difficult SNF placement per SW. Pt reports his family lives in [place].    Prior Function Level of Independence: Independent with assistive device(s);Independent         Comments: pt gave conflicting information to PT eval. States he did not have a w/c and was independent without AD/AE.     Hand Dominance        Extremity/Trunk Assessment   Upper Extremity Assessment Upper Extremity Assessment: Defer to OT evaluation    Lower Extremity Assessment Lower Extremity Assessment: LLE deficits/detail RLE Deficits / Details: ROM WFL, RLE strenght WFL LLE Deficits / Details: Denies numbness/tingling directly inferior to knee and superiorly throughout leg; edema noted at residual limb; hip strength WFL, held off MMT of knee extension and flexion due to recent sx; Slight knee flexion AROM limited, likely due to soft tissue edema, WNL knee extension AROM    Cervical / Trunk Assessment Cervical / Trunk Assessment: Normal  Communication   Communication: No difficulties  Cognition Arousal/Alertness: Awake/alert Behavior During Therapy: WFL for tasks assessed/performed Overall Cognitive Status: Within Functional Limits for tasks assessed                                 General Comments: Answering questions appropriately. Does not seem to recall telling PT he had been using a w/c PTA at initial eval. Suspect pt was experiencing some confusion during PT initial eval.      General Comments General comments (skin integrity, edema, etc.): HR up to 140 bpm towards end of gait bout, returned pt to room and bed; Educated pt on proper positioning of L residula limb  with pillow under lower leg, not knee, to encourage knee extension to decrease risk for contractures and to encourage elevation to decrease edema; Educated pt to perform L knee extension and flexion exercises    Exercises     Assessment/Plan    PT Assessment Patient needs continued PT services  PT Problem List Decreased strength;Decreased activity tolerance;Decreased balance;Decreased mobility;Decreased cognition;Decreased knowledge of use of DME;Decreased safety awareness;Decreased knowledge of precautions;Pain;Impaired sensation;Decreased range of motion;Decreased skin integrity       PT Treatment Interventions DME instruction;Gait training;Stair training;Functional mobility training;Therapeutic activities;Therapeutic exercise;Balance training;Neuromuscular re-education;Cognitive remediation;Patient/family education;Wheelchair mobility training    PT Goals (Current goals can be found in the Care Plan section)  Acute Rehab PT Goals Patient Stated Goal: to walk PT Goal Formulation: With patient Time For Goal Achievement: 08/22/20 Potential to Achieve Goals: Good    Frequency Min 3X/week   Barriers to discharge Decreased caregiver support      Co-evaluation               AM-PAC PT "6 Clicks" Mobility  Outcome Measure Help needed turning from your back to your side while in a flat bed without using bedrails?: None Help needed moving from lying on your back to sitting on the side of  a flat bed without using bedrails?: A Little Help needed moving to and from a bed to a chair (including a wheelchair)?: A Little Help needed standing up from a chair using your arms (e.g., wheelchair or bedside chair)?: A Little Help needed to walk in hospital room?: A Little Help needed climbing 3-5 steps with a railing? : A Lot 6 Click Score: 18    End of Session Equipment Utilized During Treatment: Gait belt Activity Tolerance: Patient tolerated treatment well Patient left: in bed;with call  bell/phone within reach;with bed alarm set   PT Visit Diagnosis: Other abnormalities of gait and mobility (R26.89);Pain;Unsteadiness on feet (R26.81);Difficulty in walking, not elsewhere classified (R26.2) Pain - Right/Left: Left Pain - part of body:  (residual limb)    Time: 1121-6244 PT Time Calculation (min) (ACUTE ONLY): 20 min   Charges:   PT Evaluation $PT Re-evaluation: 1 Re-eval          Raymond Gurney, PT, DPT Acute Rehabilitation Services  Pager: [phone] Office: [phone]   Jewel Baize 08/08/2020, 4:48 PM

## 2020-08-08 NOTE — Evaluation (Addendum)
Occupational Therapy Evaluation Patient Details Name: Jake Nguyen MRN: [id number] DOB: 27-May-1965 Today's Date: 08/08/2020    History of Present Illness 55 y.o. male presents to [facility] ED on 07/31/2020 with worsening L foot pain. Pt with history of L foot gangrene for ~1 month, there were plans for vascular surgery at [facility] however pt left AMA. He had LLE angiogram 4/26.  pT S/P left BKA 4/27.   PMH includes medical history significant of alcohol abuse, left foot gangrene.   Clinical Impression   Pt admitted with the above diagnoses and presents with below problem list. Pt will benefit from continued acute OT to address the below listed deficits and maximize independence with basic ADLs prior to d/c to venue below. PTA pt reports he was ambulating (denies/does not recall stating he used a w/c during PT eval) and living in a boarding home; suspect he was struggling with LB ADLs and functional transfers/mobility as condition of L foot deteriorated. Pt currently needs up to min guard assist with LB ADLs, functional transfers and mobility. Educated on importance of keeping L knee in extension as much as possible; suspect he will need reinforcement of education. Complicated d/c planning considering insecure housing status, no insurance, and h/o ETOH abuse; appreciate SW's help with this. Pt would benefit from ST SNF for continued amputee rehab.      Follow Up Recommendations  SNF;Supervision/Assistance - 24 hour;Supervision - Intermittent    Equipment Recommendations  Other (comment) (TBD depending on d/c venue)    Recommendations for Other Services PT consult     Precautions / Restrictions Precautions Precautions: Fall Precaution Comments: tachycardia Restrictions Weight Bearing Restrictions: Yes LLE Weight Bearing: Non weight bearing Other Position/Activity Restrictions: no order set addressing WB status but assuming LLE NWB given recent BKA      Mobility Bed Mobility                General bed mobility comments: sitting EOB at start and end of session    Transfers Overall transfer level: Needs assistance Equipment used: Rolling walker (2 wheeled) Transfers: Sit to/from Stand Sit to Stand: Min guard         General transfer comment: min guard from regular EOB height. min guard for safety. no LOB.    Balance Overall balance assessment: Needs assistance Sitting-balance support: No upper extremity supported;Feet supported Sitting balance-Leahy Scale: Good Sitting balance - Comments: sitting EOB reading the newspaper   Standing balance support: Bilateral upper extremity supported Standing balance-Leahy Scale: Poor Standing balance comment: reliant on the RW                           ADL either performed or assessed with clinical judgement   ADL Overall ADL's : Needs assistance/impaired Eating/Feeding: Set up;Sitting   Grooming: Set up;Sitting   Upper Body Bathing: Set up;Sitting   Lower Body Bathing: Min guard;Sit to/from stand   Upper Body Dressing : Set up;Sitting   Lower Body Dressing: Min guard;Sit to/from stand   Toilet Transfer: Min guard;Ambulation;RW   Toileting- Architect and Hygiene: Min guard;Set up;Sitting/lateral lean;Sit to/from stand   Tub/ Shower Transfer: Min guard;Ambulation;Rolling walker   Functional mobility during ADLs: Min guard;Rolling walker General ADL Comments: Pt recently returned from walking in the hall with mobility tech. received sitting EOB reading newspaper. Min guard for functional transfers/mobility. LB ADLs in sit<>stand vs sitting/lateral leans. Educated on importance of keeping L knee in extension as much as possible.  Pt reports he thinks he will get a prosthetic eventually, seems unsure.     Vision         Perception     Praxis      Pertinent Vitals/Pain Pain Assessment: Faces Faces Pain Scale: Hurts a little bit Pain Location: L stump Pain Descriptors / Indicators:  Discomfort;Grimacing Pain Intervention(s): Monitored during session     Hand Dominance     Extremity/Trunk Assessment Upper Extremity Assessment Upper Extremity Assessment: Overall WFL for tasks assessed   Lower Extremity Assessment Lower Extremity Assessment: Defer to PT evaluation   Cervical / Trunk Assessment Cervical / Trunk Assessment: Normal   Communication Communication Communication: No difficulties   Cognition Arousal/Alertness: Awake/alert Behavior During Therapy: WFL for tasks assessed/performed Overall Cognitive Status: Within Functional Limits for tasks assessed                                 General Comments: Answering questions appropriately. Does not seem to recall telling PT he had been using a w/c PTA. Suspect pt was experiencing some confusion during PT eval.   General Comments  Observed HR sitting EOB prior to start of session: 115-123. HR 119 after walking in the room    Exercises     Shoulder Instructions      Home Living Family/patient expects to be discharged to:: Unsure Living Arrangements: Alone Available Help at Discharge: Family;Other (Comment) (brother present on eval) Type of Home: House Home Access: Stairs to enter [employer] of Steps: 3   Home Layout: One level               Home Equipment:  (pt reports not having a w/c "That's someone else. I wish I had one.")   Additional Comments: per chart review and conversation with pt, housing status remains in question. Recently in a boarding house but per chart review that is closing on Monday. Difficult SNF placement per SW.      Prior Functioning/Environment Level of Independence: Independent with assistive device(s)        Comments: pt gave conflicting information to PT eval. States he did not have a w/c and seemed shocked when mentioned.        OT Problem List: Decreased strength;Decreased activity tolerance;Impaired balance (sitting and/or  standing);Decreased knowledge of use of DME or AE;Decreased knowledge of precautions;Cardiopulmonary status limiting activity;Pain      OT Treatment/Interventions: Self-care/ADL training;Energy conservation;DME and/or AE instruction;Therapeutic activities;Patient/family education;Balance training    OT Goals(Current goals can be found in the care plan section) Acute Rehab OT Goals Patient Stated Goal: pt goal to go outside OT Goal Formulation: With patient Time For Goal Achievement: 08/21/20 Potential to Achieve Goals: Good ADL Goals Pt Will Perform Lower Body Bathing: with modified independence;sit to/from stand Pt Will Perform Lower Body Dressing: with modified independence;sit to/from stand Pt Will Transfer to Toilet: with modified independence;ambulating Pt Will Perform Toileting - Clothing Manipulation and hygiene: with modified independence;sitting/lateral leans;sit to/from stand Pt Will Perform Tub/Shower Transfer: with modified independence;ambulating;3 in 1;rolling walker Additional ADL Goal #1: Pt will be independent with education regarding positioning of L residual stump towards improved functional mobility/transfer goals.  OT Frequency: Min 2X/week   Barriers to D/C: Decreased caregiver support;Other (comment)  h/o homelessness, boarding home where he has been staying is closing Monday per chart review. H/o ETOH abuse.       Co-evaluation  AM-PAC OT "6 Clicks" Daily Activity     Outcome Measure Help from another person eating meals?: None Help from another person taking care of personal grooming?: None Help from another person toileting, which includes using toliet, bedpan, or urinal?: A Little Help from another person bathing (including washing, rinsing, drying)?: A Little Help from another person to put on and taking off regular upper body clothing?: None Help from another person to put on and taking off regular lower body clothing?: A Little 6 Click  Score: 21   End of Session Equipment Utilized During Treatment: Rolling walker Nurse Communication: Other (comment) (HR)  Activity Tolerance: Patient tolerated treatment well (tachycardic) Patient left: with call bell/phone within reach;with family/visitor present;Other (comment) (sitting EOB reading newspaper)  OT Visit Diagnosis: Pain;Muscle weakness (generalized) (M62.81);Other abnormalities of gait and mobility (R26.89)                Time: 9935-7017 OT Time Calculation (min): 12 min Charges:  OT General Charges $OT Visit: 1 Visit OT Evaluation $OT Eval Low Complexity: 1 Low  Raynald Kemp, OT Acute Rehabilitation Services Pager: [phone] Office: [phone]   Pilar Grammes 08/08/2020, 10:37 AM

## 2020-08-08 NOTE — Progress Notes (Signed)
Mobility Specialist - Progress Note   08/08/20 0939  Mobility  Activity Ambulated in hall  Level of Assistance Contact guard assist, steadying assist  Assistive Device Front wheel walker  Distance Ambulated (ft) 160 ft  Mobility Response Tolerated well  Mobility performed by Mobility specialist  $Mobility charge 1 Mobility   Pt's HR up to 142 w/ ambulation. He required several standing rest breaks due to fatigue in both of his arms, he was diaphoretic towards the end of ambulation. Pt sitting up on edge of bed after walk and HR decreased to 110.   Mamie Levers Mobility Specialist Mobility Specialist Phone: [phone]

## 2020-08-08 NOTE — Progress Notes (Signed)
PROGRESS NOTE    Jake Nguyen  [name]:[id number] DOB: 1965/06/24 DOA: 07/31/2020 PCP: Patient, No Pcp Per (Inactive)    Brief Narrative:  Jake Nguyen is a 55 year old male with past medical history significant for EtOH abuse, left foot gangrene who presented to [facility], ED with worsening left foot pain.  Patient developed left foot gangrene about 1 month ago, evaluated at [facility] with plan for vascular surgery evaluation; however patient left AMA.  He reports being wheelchair-bound for past 2 months because of an bearable left foot pain.  In the ED, his left foot was noted to be with gangrenous changes, was tachycardic without fever.  Lactic acid within normal limits.  WBC within normal limits.  Cova-19 PCR/influenza A/B PCR negative.  Left foot x-ray with progression of soft tissue gas compatible with infection, negative for osteomyelitis.  Vascular surgery consulted.  Hospital service consulted for further evaluation and management.   Assessment & Plan:   Active Problems:   Alcohol abuse with alcohol-induced mood disorder (HCC)   Gangrene of foot (HCC)   Gangrene (HCC)   Left foot gangrene, severe Patient presenting with progressive/severe left foot pain.  Diagnosed with gangrene left foot roughly 1 month ago.  Has been wheelchair-bound for roughly 2 months.  X-ray left foot 07/31/2020 with progression of soft tissue gas compatible with infection, negative for osteomyelitis.  ABI completed with evidence of severe PAD affecting left lower extremity.  Underwent abdominal aortogram by vascular surgery, Dr. Myra Gianotti on 08/04/2020 with findings of patent superficial femoral artery down to adductor canal where it occludes with reconstitution of the peroneal artery which is patent down to the ankle where it collateralizes well posterior tibial artery.  Underwent BKA by vascular surgery, Dr. Myra Gianotti on 08/05/2020. --Vascular surgery following, appreciate assistance --Toradol 30mg   IV q6h x 3 days --Percocet 5-325mg  1-2 tabs q4h prn moderate pain --Morphine 2mg  IV q2h prn severe pain --PT recommend SNF, social work for placement  Peripheral artery disease Significant peripheral vascular disease noted on ABI and abdominal aortogram.  LDL 51 07/31/2020. --Atorvastatin 40 mg p.o. daily --Aspirin 81 mg p.o. daily  Anxiety: Gabapentin 100 mg p.o. 3 times daily as needed  Insomnia: Seroquel 25 mg p.o. nightly as needed  Hypovolemic hyponatremia: Resolved Patient presented with a sodium 132, improved with IV fluid hydration.  Hypomagnesemia Magnesium 1.7 today, will replete --Repeat electrolytes in a.m.  Hypokalemia: Resolved Potassium 3.2 today, will replete --Repeat electrolytes in the a.m.  Chronic blood loss anemia in the setting of left foot gangrene Baseline hemoglobin around 12.  No overt bleeding. --Hgb 10.5>>8.5>8.1>7.8>7.2 --repeat CBC in am --Transfuse for hemoglobin less than 7.0 or active bleeding  EtOH use disorder Counseled on need for complete cessation.  Completed Valium taper, no signs of significant EtOH withdrawal during hospitalization. --Multivitamin, thiamine, folic acid --Outpatient substance abuse treatment  Tobacco use disorder Counseled on need for complete cessation.   --Nicotine patch  Mood disorder Patient was initially placed under IVC for concern of possible suicidal ideation.  Was seen by psychiatry 07/29/2020; and was deemed not an imminent risk to self or others at present and does not meet criteria for psychiatric inpatient admission.  Etiology of his initial statements likely related to severe pain, infection and gangrene of foot with confounding factor of EtOH binge/use disorder --Discontinued IVC and sitter on 4/27  Ambulatory dysfunction Seen by PT, recommending SNF.  TOC for placement --continue PT/OT efforts while inpatient   DVT prophylaxis: Lovenox  Code Status: Full Code Family Communication: No family  present at bedside this morning, updated patient's brother who is present at bedside yesterday afternoon.  Disposition Plan:  Level of care: Telemetry Medical Status is: Inpatient  Remains inpatient appropriate because:Ongoing diagnostic testing needed not appropriate for outpatient work up, Unsafe d/c plan, IV treatments appropriate due to intensity of illness or inability to take PO and Inpatient level of care appropriate due to severity of illness   Dispo:  Patient From: Home  Planned Disposition: Skilled Nursing Facility  Medically stable for discharge: No      Consultants:   Vascular Surgery  Procedures:   Adominal aortogram, 08/04/2020, Dr. Myra Gianotti  Left BKA, 08/05/2020, Dr. Myra Gianotti  Antimicrobials:   Vancomycin 4/22>>  Cefepime 4/24 - 4/28  Zosyn 4/22 - 4/28    Subjective: Patient seen and examined bedside, resting comfortably.  Complaining of pain to his left lower extremity stump.  Brother present at bedside this morning.  No other questions or concerns at this time.  Denies headache, no chest pain, palpitations, no shortness of breath, no abdominal pain.  No acute events overnight per nursing staff.  SNF placement.  Objective: Vitals:   08/07/20 1948 08/07/20 2306 08/08/20 0342 08/08/20 0736  BP: 107/83 100/70 117/65 124/77  Pulse: 92 97 70 (!) 111  Resp: 15 17 14 15   Temp: 98.7 F (37.1 C) 98 F (36.7 C) 98.2 F (36.8 C) 98.7 F (37.1 C)  TempSrc: Oral Oral Oral Oral  SpO2: 98% 99% 97% 96%  Weight:      Height:        Intake/Output Summary (Last 24 hours) at 08/08/2020 1501 Last data filed at 08/07/2020 1900 Gross per 24 hour  Intake --  Output 300 ml  Net -300 ml   Filed Weights   07/31/20 1300 07/31/20 2135  Weight: 68.9 kg 67.3 kg    Examination:  General exam: Appears calm and comfortable, appears older than stated age Respiratory system: Clear to auscultation. Respiratory effort normal.  On room air Cardiovascular system: S1 & S2  heard, RRR. No JVD, murmurs, rubs, gallops or clicks. No pedal edema. Gastrointestinal system: Abdomen is nondistended, soft and nontender. No organomegaly or masses felt. Normal bowel sounds heard. Central nervous system: Alert and oriented. No focal neurological deficits. Extremities: Moves all extremities independently, muscle strength globally intact, noted left BKA site with dressing/Ace wrap in place Skin: Left lower extremity with surgical dressing/Ace wrap in place, clean/dry/intact Psychiatry: Judgement and insight appear poor. Mood & affect appropriate.    Data Reviewed: I have personally reviewed following labs and imaging studies  CBC: Recent Labs  Lab 08/04/20 0203 08/05/20 0218 08/06/20 0144 08/07/20 0124 08/08/20 0145  WBC 7.9 7.5 8.5 9.8 9.1  HGB 9.6* 8.5* 8.1* 7.8* 7.2*  HCT 30.3* 27.1* 25.7* 24.3* 22.2*  MCV 84.6 85.8 86.2 84.7 84.1  PLT 203 204 225 264 281   Basic Metabolic Panel: Recent Labs  Lab 08/03/20 0615 08/04/20 0203 08/05/20 0218 08/06/20 0144 08/07/20 0124 08/07/20 0750 08/08/20 0145  NA 133* 135 136 135 134*  --  135  K 3.6 3.8 3.6 3.8 3.2*  --  3.5  CL 100 99 102 103 101  --  101  CO2 24 29 25 25 26   --  26  GLUCOSE 96 117* 107* 133* 133*  --  117*  BUN <5* <5* <5* 7 6  --  6  CREATININE 0.44* 0.60* 0.51* 0.53* 0.59*  --  0.61  CALCIUM  8.3* 8.7* 8.2* 7.9* 7.8*  --  7.8*  MG 1.7  --   --  1.7  --  1.7 1.9  PHOS 3.6  --   --   --   --   --   --    GFR: Estimated Creatinine Clearance: 102.8 mL/min (by C-G formula based on SCr of 0.61 mg/dL). Liver Function Tests: No results for input(s): AST, ALT, ALKPHOS, BILITOT, PROT, ALBUMIN in the last 168 hours. No results for input(s): LIPASE, AMYLASE in the last 168 hours. No results for input(s): AMMONIA in the last 168 hours. Coagulation Profile: Recent Labs  Lab 08/05/20 0218  INR 1.1   Cardiac Enzymes: No results for input(s): CKTOTAL, CKMB, CKMBINDEX, TROPONINI in the last 168  hours. BNP (last 3 results) No results for input(s): PROBNP in the last 8760 hours. HbA1C: No results for input(s): HGBA1C in the last 72 hours. CBG: No results for input(s): GLUCAP in the last 168 hours. Lipid Profile: No results for input(s): CHOL, HDL, LDLCALC, TRIG, CHOLHDL, LDLDIRECT in the last 72 hours. Thyroid Function Tests: No results for input(s): TSH, T4TOTAL, FREET4, T3FREE, THYROIDAB in the last 72 hours. Anemia Panel: No results for input(s): VITAMINB12, FOLATE, FERRITIN, TIBC, IRON, RETICCTPCT in the last 72 hours. Sepsis Labs: No results for input(s): PROCALCITON, LATICACIDVEN in the last 168 hours.  Recent Results (from the past 240 hour(s))  Resp Panel by RT-PCR (Flu A&B, Covid) Nasopharyngeal Swab     Status: None   Collection Time: 07/31/20 10:36 AM   Specimen: Nasopharyngeal Swab; Nasopharyngeal(NP) swabs in vial transport medium  Result Value Ref Range Status   SARS Coronavirus 2 by RT PCR NEGATIVE NEGATIVE Final    Comment: (NOTE) SARS-CoV-2 target nucleic acids are NOT DETECTED.  The SARS-CoV-2 RNA is generally detectable in upper respiratory specimens during the acute phase of infection. The lowest concentration of SARS-CoV-2 viral copies this assay can detect is 138 copies/mL. A negative result does not preclude SARS-Cov-2 infection and should not be used as the sole basis for treatment or other patient management decisions. A negative result may occur with  improper specimen collection/handling, submission of specimen other than nasopharyngeal swab, presence of viral mutation(s) within the areas targeted by this assay, and inadequate number of viral copies(<138 copies/mL). A negative result must be combined with clinical observations, patient history, and epidemiological information. The expected result is Negative.  Fact Sheet for Patients:  BloggerCourse.comhttps://www.fda.gov/media/152166/download  Fact Sheet for Healthcare Providers:   SeriousBroker.ithttps://www.fda.gov/media/152162/download  This test is no t yet approved or cleared by the [place] FDA and  has been authorized for detection and/or diagnosis of SARS-CoV-2 by FDA under an Emergency Use Authorization (EUA). This EUA will remain  in effect (meaning this test can be used) for the duration of the COVID-19 declaration under Section 564(b)(1) of the Act, 21 U.S.C.section 360bbb-3(b)(1), unless the authorization is terminated  or revoked sooner.       Influenza A by PCR NEGATIVE NEGATIVE Final   Influenza B by PCR NEGATIVE NEGATIVE Final    Comment: (NOTE) The Xpert Xpress SARS-CoV-2/FLU/RSV plus assay is intended as an aid in the diagnosis of influenza from Nasopharyngeal swab specimens and should not be used as a sole basis for treatment. Nasal washings and aspirates are unacceptable for Xpert Xpress SARS-CoV-2/FLU/RSV testing.  Fact Sheet for Patients: BloggerCourse.comhttps://www.fda.gov/media/152166/download  Fact Sheet for Healthcare Providers: SeriousBroker.ithttps://www.fda.gov/media/152162/download  This test is not yet approved or cleared by the [place] FDA and has been authorized for detection  and/or diagnosis of SARS-CoV-2 by FDA under an Emergency Use Authorization (EUA). This EUA will remain in effect (meaning this test can be used) for the duration of the COVID-19 declaration under Section 564(b)(1) of the Act, 21 U.S.C. section 360bbb-3(b)(1), unless the authorization is terminated or revoked.  Performed at [facility], [address] [place], [place] [address]   Culture, blood (routine x 2)     Status: None   Collection Time: 08/01/20 11:57 AM   Specimen: BLOOD  Result Value Ref Range Status   Specimen Description BLOOD RIGHT ANTECUBITAL  Final   Special Requests   Final    BOTTLES DRAWN AEROBIC AND ANAEROBIC Blood Culture adequate volume   Culture   Final    NO GROWTH 5 DAYS Performed at [facility], [address], [place], [place] [address]     Report Status 08/06/2020 FINAL  Final  Culture, blood (routine x 2)     Status: None   Collection Time: 08/01/20 12:04 PM   Specimen: BLOOD LEFT HAND  Result Value Ref Range Status   Specimen Description BLOOD LEFT HAND  Final   Special Requests   Final    BOTTLES DRAWN AEROBIC AND ANAEROBIC Blood Culture results may not be optimal due to an inadequate volume of blood received in culture bottles   Culture   Final    NO GROWTH 5 DAYS Performed at [facility], [address], [place], [place] [address]    Report Status 08/06/2020 FINAL  Final  Surgical PCR screen     Status: Abnormal   Collection Time: 08/04/20  7:47 AM   Specimen: Nasal Mucosa; Nasal Swab  Result Value Ref Range Status   MRSA, PCR NEGATIVE NEGATIVE Final   Staphylococcus aureus POSITIVE (A) NEGATIVE Final    Comment: (NOTE) The Xpert SA Assay (FDA approved for NASAL specimens in patients 73 years of age and older), is one component of a comprehensive surveillance program. It is not intended to diagnose infection nor to guide or monitor treatment. Performed at [facility], [address] [place], [place] [address]          Radiology Studies: No results found.      Scheduled Meds: . aspirin EC  325 mg Oral Daily  . atorvastatin  40 mg Oral Daily  . enoxaparin (LOVENOX) injection  40 mg Subcutaneous Q24H  . folic acid  1 mg Oral Daily  . ketorolac  30 mg Intravenous Q6H  . multivitamin with minerals  1 tablet Oral Daily  . mupirocin ointment  1 application Nasal BID  . nicotine  21 mg Transdermal Daily  . senna-docusate  2 tablet Oral BID  . sodium chloride flush  3 mL Intravenous Q12H  . thiamine  100 mg Oral Daily   Or  . thiamine  100 mg Intravenous Daily   Continuous Infusions: . sodium chloride 250 mL (08/05/20 2120)     LOS: 8 days    Time spent: 36 minutes spent on chart review, discussion with nursing staff, consultants, updating family and interview/physical exam;  more than 50% of that time was spent in counseling and/or coordination of care.    Alvira Philips [place], DO [facility] Available via Epic secure chat 7am-7pm After these hours, please refer to coverage provider listed on [email or web address] 08/08/2020, 3:01 PM

## 2020-08-08 NOTE — Progress Notes (Signed)
    Subjective  - POD #3, status post left below-knee amputation  Pain is better controlled   Physical Exam:  Incision is healing without drainage or erythema       Assessment/Plan:  POD #3  Continue with PT/OT/mobility assessments. Awaiting discharge to skilled nursing facility versus inpatient rehab  Wells Tiffane Sheldon 08/08/2020 8:52 AM --  Vitals:   08/08/20 0342 08/08/20 0736  BP: 117/65 124/77  Pulse: 70 (!) 111  Resp: 14 15  Temp: 98.2 F (36.8 C) 98.7 F (37.1 C)  SpO2: 97% 96%    Intake/Output Summary (Last 24 hours) at 08/08/2020 0852 Last data filed at 08/07/2020 1900 Gross per 24 hour  Intake 250 ml  Output 1130 ml  Net -880 ml     Laboratory CBC    Component Value Date/Time   WBC 9.1 08/08/2020 0145   HGB 7.2 (L) 08/08/2020 0145   HCT 22.2 (L) 08/08/2020 0145   HCT 36.4 (L) 05/20/2018 1923   PLT 281 08/08/2020 0145    BMET    Component Value Date/Time   NA 135 08/08/2020 0145   K 3.5 08/08/2020 0145   CL 101 08/08/2020 0145   CO2 26 08/08/2020 0145   GLUCOSE 117 (H) 08/08/2020 0145   BUN 6 08/08/2020 0145   CREATININE 0.61 08/08/2020 0145   CALCIUM 7.8 (L) 08/08/2020 0145   GFRNONAA >60 08/08/2020 0145   GFRAA >60 11/12/2019 0359    COAG Lab Results  Component Value Date   INR 1.1 08/05/2020   INR 1.1 11/07/2019   INR 0.93 05/20/2018   No results found for: PTT  Antibiotics Anti-infectives (From admission, onward)   Start     Dose/Rate Route Frequency Ordered Stop   08/02/20 1400  ceFEPIme (MAXIPIME) 2 g in sodium chloride 0.9 % 100 mL IVPB        2 g 200 mL/hr over 30 Minutes Intravenous Every 8 hours 08/02/20 1142 08/06/20 2319   08/01/20 0200  vancomycin (VANCOREADY) IVPB 1250 mg/250 mL        1,250 mg 166.7 mL/hr over 90 Minutes Intravenous Every 12 hours 07/31/20 1317 08/06/20 1604   07/31/20 2000  piperacillin-tazobactam (ZOSYN) IVPB 3.375 g  Status:  Discontinued        3.375 g 12.5 mL/hr over 240 Minutes  Intravenous Every 8 hours 07/31/20 1317 08/02/20 1141   07/31/20 1330  piperacillin-tazobactam (ZOSYN) IVPB 3.375 g        3.375 g 100 mL/hr over 30 Minutes Intravenous  Once 07/31/20 1315 07/31/20 1402   07/31/20 1315  vancomycin (VANCOREADY) IVPB 1250 mg/250 mL        1,250 mg 166.7 mL/hr over 90 Minutes Intravenous  Once 07/31/20 1306 07/31/20 1640       V. Charlena Cross, M.D., [facility] [place] Office: [phone] Pager:  [phone]

## 2020-08-09 MED ORDER — METHOCARBAMOL 500 MG PO TABS
500.0000 mg | ORAL_TABLET | Freq: Four times a day (QID) | ORAL | Status: DC | PRN
  Administered 2020-08-09 – 2020-08-14 (×8): 500 mg via ORAL
  Filled 2020-08-09 (×8): qty 1

## 2020-08-09 MED ORDER — GABAPENTIN 100 MG PO CAPS
100.0000 mg | ORAL_CAPSULE | Freq: Three times a day (TID) | ORAL | Status: DC
  Administered 2020-08-09 – 2020-08-10 (×3): 100 mg via ORAL
  Filled 2020-08-09 (×3): qty 1

## 2020-08-09 NOTE — Progress Notes (Addendum)
  Progress Note    08/09/2020 9:08 AM 4 Days Post-Op  Subjective:  No complaints. Pain about the same in left BKA   Vitals:   08/09/20 0537 08/09/20 0805  BP: 121/83 114/69  Pulse: 97 94  Resp: 16 18  Temp: 98.6 F (37 C) 98.4 F (36.9 C)  SpO2: 96% 97%   Physical Exam: Cardiac:Regular Lungs: non labored Incisions:   BKA looks okay. Some drainage from central aspect. Dry Dressings applied. Flaps viable. No appreciable fluid collections Extremities:  Well perfused and warm Abdomen:  Flat soft non tender Neurologic: alert and oriented  CBC    Component Value Date/Time   WBC 9.1 08/08/2020 0145   RBC 2.64 (L) 08/08/2020 0145   HGB 7.2 (L) 08/08/2020 0145   HCT 22.2 (L) 08/08/2020 0145   HCT 36.4 (L) 05/20/2018 1923   PLT 281 08/08/2020 0145   MCV 84.1 08/08/2020 0145   MCH 27.3 08/08/2020 0145   MCHC 32.4 08/08/2020 0145   RDW 15.2 08/08/2020 0145   LYMPHSABS 2.7 07/31/2020 1036   MONOABS 0.7 07/31/2020 1036   EOSABS 0.0 07/31/2020 1036   BASOSABS 0.1 07/31/2020 1036    BMET    Component Value Date/Time   NA 135 08/08/2020 0145   K 3.5 08/08/2020 0145   CL 101 08/08/2020 0145   CO2 26 08/08/2020 0145   GLUCOSE 117 (H) 08/08/2020 0145   BUN 6 08/08/2020 0145   CREATININE 0.61 08/08/2020 0145   CALCIUM 7.8 (L) 08/08/2020 0145   GFRNONAA >60 08/08/2020 0145   GFRAA >60 11/12/2019 0359    INR    Component Value Date/Time   INR 1.1 08/05/2020 0218     Intake/Output Summary (Last 24 hours) at 08/09/2020 0908 Last data filed at 08/09/2020 0500 Gross per 24 hour  Intake --  Output 1700 ml  Net -1700 ml     Assessment/Plan:  55 y.o. male is s/p left BKA 4 Days Post-Op. Doing well post op. Pain manageable. BKA looks okay. Some drainage from central aspect. Dry Dressings applied. Flaps viable. No appreciable fluid collections. Continue to mobilize/ PT/ OT. Awaiting dispo. Will follow up in vascular in 4 weeks for staple removal  DVT prophylaxis:   Lovenox   Graceann Congress, PA-C [facility] [phone] 08/09/2020 9:09 AM   I agree with the above.  I have seen and evaluated the patient.  Durene Cal

## 2020-08-09 NOTE — Progress Notes (Signed)
Mobility Specialist - Progress Note   08/09/20 1402  Mobility  Activity Ambulated in hall  Level of Assistance Contact guard assist, steadying assist  Assistive Device Front wheel walker  Distance Ambulated (ft) 90 ft  Mobility Response Tolerated well  Mobility performed by Mobility specialist  $Mobility charge 1 Mobility   Pre-mobility: 114 HR During mobility: 144 HR Post-mobility: 128 HR  Pt c/o pain in stump throughout session. Pt requesting pain meds after walk, RN notified. Pt sitting up on edge of bed after ambulation.   Mamie Levers Mobility Specialist Mobility Specialist Phone: [phone]

## 2020-08-09 NOTE — Progress Notes (Signed)
PROGRESS NOTE    Jake Nguyen  XNA:[id number] DOB: 1966-04-09 DOA: 07/31/2020 PCP: Patient, No Pcp Per (Inactive)    Brief Narrative:  Jake Nguyen is a 55 year old male with past medical history significant for EtOH abuse, left foot gangrene who presented to [facility], ED with worsening left foot pain.  Patient developed left foot gangrene about 1 month ago, evaluated at [facility] with plan for vascular surgery evaluation; however patient left AMA.  He reports being wheelchair-bound for past 2 months because of an bearable left foot pain.  In the ED, his left foot was noted to be with gangrenous changes, was tachycardic without fever.  Lactic acid within normal limits.  WBC within normal limits.  Cova-19 PCR/influenza A/B PCR negative.  Left foot x-ray with progression of soft tissue gas compatible with infection, negative for osteomyelitis.  Vascular surgery consulted.  Hospital service consulted for further evaluation and management.   Assessment & Plan:   Active Problems:   Alcohol abuse with alcohol-induced mood disorder (HCC)   Gangrene of foot (HCC)   Gangrene (HCC)   Left foot gangrene, severe Patient presenting with progressive/severe left foot pain.  Diagnosed with gangrene left foot roughly 1 month ago.  Has been wheelchair-bound for roughly 2 months.  X-ray left foot 07/31/2020 with progression of soft tissue gas compatible with infection, negative for osteomyelitis.  ABI completed with evidence of severe PAD affecting left lower extremity.  Underwent abdominal aortogram by vascular surgery, Dr. Myra Gianotti on 08/04/2020 with findings of patent superficial femoral artery down to adductor canal where it occludes with reconstitution of the peroneal artery which is patent down to the ankle where it collateralizes well posterior tibial artery.  Underwent BKA by vascular surgery, Dr. Myra Gianotti on 08/05/2020. --Vascular surgery following, appreciate assistance --Percocet  5-325mg  1-2 tabs q4h prn moderate pain --Morphine 2mg  IV q2h prn severe pain --Gabapentin 100 mg p.o. 3 times daily --Outpatient follow-up with vascular surgery 4 weeks for staple removal --PT recommend SNF, social work for placement  Peripheral artery disease Significant peripheral vascular disease noted on ABI and abdominal aortogram.  LDL 51 07/31/2020. --Atorvastatin 40 mg p.o. daily --Aspirin 81 mg p.o. daily  Anxiety: Gabapentin 100 mg p.o. 3 times daily   Insomnia: Seroquel 25 mg p.o. nightly as needed  Hypovolemic hyponatremia: Resolved Patient presented with a sodium 132, improved with IV fluid hydration.  Hypomagnesemia Repleted --Repeat electrolytes in a.m.  Hypokalemia: Resolved Repleted --Repeat electrolytes in the a.m.  Chronic blood loss anemia in the setting of left foot gangrene Baseline hemoglobin around 12.  No overt bleeding. --Hgb 10.5>>8.5>8.1>7.8>7.2 --repeat CBC in am --Transfuse for hemoglobin less than 7.0 or active bleeding  EtOH use disorder Counseled on need for complete cessation.  Completed Valium taper, no signs of significant EtOH withdrawal during hospitalization. --Multivitamin, thiamine, folic acid --Outpatient substance abuse treatment  Tobacco use disorder Counseled on need for complete cessation.   --Nicotine patch  Mood disorder Patient was initially placed under IVC for concern of possible suicidal ideation.  Was seen by psychiatry 07/29/2020; and was deemed not an imminent risk to self or others at present and does not meet criteria for psychiatric inpatient admission.  Etiology of his initial statements likely related to severe pain, infection and gangrene of foot with confounding factor of EtOH binge/use disorder --Discontinued IVC and sitter on 4/27  Ambulatory dysfunction Seen by PT, recommending SNF.  TOC for placement --continue PT/OT efforts while inpatient   DVT prophylaxis: Lovenox  Code Status: Full Code Family  Communication: No family present at bedside this morning, updated patient's brother who is present at bedside yesterday afternoon.  Disposition Plan:  Level of care: Telemetry Medical Status is: Inpatient  Remains inpatient appropriate because:Ongoing diagnostic testing needed not appropriate for outpatient work up, Unsafe d/c plan, IV treatments appropriate due to intensity of illness or inability to take PO and Inpatient level of care appropriate due to severity of illness   Dispo:  Patient From: Home  Planned Disposition: Skilled Nursing Facility  Medically stable for discharge: Yes  Difficult to place patient: Yes   Consultants:   Vascular Surgery  Procedures:   Adominal aortogram, 08/04/2020, Dr. Myra Gianotti  Left BKA, 08/05/2020, Dr. Myra Gianotti  Antimicrobials:   Vancomycin 4/22>>  Cefepime 4/24 - 4/28  Zosyn 4/22 - 4/28    Subjective: Patient seen and examined bedside, resting comfortably.  Complaining of pain to his left lower extremity stump.  Brother present at bedside this morning.  No other questions or concerns at this time.  Denies headache, no chest pain, palpitations, no shortness of breath, no abdominal pain.  No acute events overnight per nursing staff.  SNF placement.  Objective: Vitals:   08/08/20 2000 08/09/20 0537 08/09/20 0805 08/09/20 0954  BP: 94/60 121/83 114/69   Pulse: (!) 108 97 94   Resp: 20 16 18 16   Temp: 98.7 F (37.1 C) 98.6 F (37 C) 98.4 F (36.9 C)   TempSrc: Oral Oral Oral   SpO2: 94% 96% 97%   Weight:      Height:        Intake/Output Summary (Last 24 hours) at 08/09/2020 1119 Last data filed at 08/09/2020 0500 Gross per 24 hour  Intake --  Output 1700 ml  Net -1700 ml   Filed Weights   07/31/20 1300 07/31/20 2135  Weight: 68.9 kg 67.3 kg    Examination:  General exam: Appears calm and comfortable, appears older than stated age Respiratory system: Clear to auscultation. Respiratory effort normal.  On room  air Cardiovascular system: S1 & S2 heard, RRR. No JVD, murmurs, rubs, gallops or clicks. No pedal edema. Gastrointestinal system: Abdomen is nondistended, soft and nontender. No organomegaly or masses felt. Normal bowel sounds heard. Central nervous system: Alert and oriented. No focal neurological deficits. Extremities: Moves all extremities independently, muscle strength globally intact, noted left BKA site with dressing/Ace wrap in place Skin: Left lower extremity with surgical dressing/Ace wrap in place, clean/dry/intact Psychiatry: Judgement and insight appear poor. Mood & affect appropriate.    Data Reviewed: I have personally reviewed following labs and imaging studies  CBC: Recent Labs  Lab 08/04/20 0203 08/05/20 0218 08/06/20 0144 08/07/20 0124 08/08/20 0145  WBC 7.9 7.5 8.5 9.8 9.1  HGB 9.6* 8.5* 8.1* 7.8* 7.2*  HCT 30.3* 27.1* 25.7* 24.3* 22.2*  MCV 84.6 85.8 86.2 84.7 84.1  PLT 203 204 225 264 281   Basic Metabolic Panel: Recent Labs  Lab 08/03/20 0615 08/04/20 0203 08/05/20 0218 08/06/20 0144 08/07/20 0124 08/07/20 0750 08/08/20 0145  NA 133* 135 136 135 134*  --  135  K 3.6 3.8 3.6 3.8 3.2*  --  3.5  CL 100 99 102 103 101  --  101  CO2 24 29 25 25 26   --  26  GLUCOSE 96 117* 107* 133* 133*  --  117*  BUN <5* <5* <5* 7 6  --  6  CREATININE 0.44* 0.60* 0.51* 0.53* 0.59*  --  0.61  CALCIUM  8.3* 8.7* 8.2* 7.9* 7.8*  --  7.8*  MG 1.7  --   --  1.7  --  1.7 1.9  PHOS 3.6  --   --   --   --   --   --    GFR: Estimated Creatinine Clearance: 102.8 mL/min (by C-G formula based on SCr of 0.61 mg/dL). Liver Function Tests: No results for input(s): AST, ALT, ALKPHOS, BILITOT, PROT, ALBUMIN in the last 168 hours. No results for input(s): LIPASE, AMYLASE in the last 168 hours. No results for input(s): AMMONIA in the last 168 hours. Coagulation Profile: Recent Labs  Lab 08/05/20 0218  INR 1.1   Cardiac Enzymes: No results for input(s): CKTOTAL, CKMB,  CKMBINDEX, TROPONINI in the last 168 hours. BNP (last 3 results) No results for input(s): PROBNP in the last 8760 hours. HbA1C: No results for input(s): HGBA1C in the last 72 hours. CBG: No results for input(s): GLUCAP in the last 168 hours. Lipid Profile: No results for input(s): CHOL, HDL, LDLCALC, TRIG, CHOLHDL, LDLDIRECT in the last 72 hours. Thyroid Function Tests: No results for input(s): TSH, T4TOTAL, FREET4, T3FREE, THYROIDAB in the last 72 hours. Anemia Panel: No results for input(s): VITAMINB12, FOLATE, FERRITIN, TIBC, IRON, RETICCTPCT in the last 72 hours. Sepsis Labs: No results for input(s): PROCALCITON, LATICACIDVEN in the last 168 hours.  Recent Results (from the past 240 hour(s))  Resp Panel by RT-PCR (Flu A&B, Covid) Nasopharyngeal Swab     Status: None   Collection Time: 07/31/20 10:36 AM   Specimen: Nasopharyngeal Swab; Nasopharyngeal(NP) swabs in vial transport medium  Result Value Ref Range Status   SARS Coronavirus 2 by RT PCR NEGATIVE NEGATIVE Final    Comment: (NOTE) SARS-CoV-2 target nucleic acids are NOT DETECTED.  The SARS-CoV-2 RNA is generally detectable in upper respiratory specimens during the acute phase of infection. The lowest concentration of SARS-CoV-2 viral copies this assay can detect is 138 copies/mL. A negative result does not preclude SARS-Cov-2 infection and should not be used as the sole basis for treatment or other patient management decisions. A negative result may occur with  improper specimen collection/handling, submission of specimen other than nasopharyngeal swab, presence of viral mutation(s) within the areas targeted by this assay, and inadequate number of viral copies(<138 copies/mL). A negative result must be combined with clinical observations, patient history, and epidemiological information. The expected result is Negative.  Fact Sheet for Patients:  BloggerCourse.com  Fact Sheet for Healthcare  Providers:  SeriousBroker.it  This test is no t yet approved or cleared by the [place] FDA and  has been authorized for detection and/or diagnosis of SARS-CoV-2 by FDA under an Emergency Use Authorization (EUA). This EUA will remain  in effect (meaning this test can be used) for the duration of the COVID-19 declaration under Section 564(b)(1) of the Act, 21 U.S.C.section 360bbb-3(b)(1), unless the authorization is terminated  or revoked sooner.       Influenza A by PCR NEGATIVE NEGATIVE Final   Influenza B by PCR NEGATIVE NEGATIVE Final    Comment: (NOTE) The Xpert Xpress SARS-CoV-2/FLU/RSV plus assay is intended as an aid in the diagnosis of influenza from Nasopharyngeal swab specimens and should not be used as a sole basis for treatment. Nasal washings and aspirates are unacceptable for Xpert Xpress SARS-CoV-2/FLU/RSV testing.  Fact Sheet for Patients: BloggerCourse.com  Fact Sheet for Healthcare Providers: SeriousBroker.it  This test is not yet approved or cleared by the [place] FDA and has been authorized for detection  and/or diagnosis of SARS-CoV-2 by FDA under an Emergency Use Authorization (EUA). This EUA will remain in effect (meaning this test can be used) for the duration of the COVID-19 declaration under Section 564(b)(1) of the Act, 21 U.S.C. section 360bbb-3(b)(1), unless the authorization is terminated or revoked.  Performed at [facility], [address] [place], [place] [address]   Culture, blood (routine x 2)     Status: None   Collection Time: 08/01/20 11:57 AM   Specimen: BLOOD  Result Value Ref Range Status   Specimen Description BLOOD RIGHT ANTECUBITAL  Final   Special Requests   Final    BOTTLES DRAWN AEROBIC AND ANAEROBIC Blood Culture adequate volume   Culture   Final    NO GROWTH 5 DAYS Performed at [facility], [address] [place] [address]    Report Status 08/06/2020 FINAL  Final  Culture, blood (routine x 2)     Status: None   Collection Time: 08/01/20 12:04 PM   Specimen: BLOOD LEFT HAND  Result Value Ref Range Status   Specimen Description BLOOD LEFT HAND  Final   Special Requests   Final    BOTTLES DRAWN AEROBIC AND ANAEROBIC Blood Culture results may not be optimal due to an inadequate volume of blood received in culture bottles   Culture   Final    NO GROWTH 5 DAYS Performed at [facility], [address] [place], [place] [address]    Report Status 08/06/2020 FINAL  Final  Surgical PCR screen     Status: Abnormal   Collection Time: 08/04/20  7:47 AM   Specimen: Nasal Mucosa; Nasal Swab  Result Value Ref Range Status   MRSA, PCR NEGATIVE NEGATIVE Final   Staphylococcus aureus POSITIVE (A) NEGATIVE Final    Comment: (NOTE) The Xpert SA Assay (FDA approved for NASAL specimens in patients 53 years of age and older), is one component of a comprehensive surveillance program. It is not intended to diagnose infection nor to guide or monitor treatment. Performed at [facility], [address] [place], [place] [address]          Radiology Studies: No results found.      Scheduled Meds: . aspirin EC  325 mg Oral Daily  . atorvastatin  40 mg Oral Daily  . enoxaparin (LOVENOX) injection  40 mg Subcutaneous Q24H  . folic acid  1 mg Oral Daily  . multivitamin with minerals  1 tablet Oral Daily  . nicotine  21 mg Transdermal Daily  . senna-docusate  2 tablet Oral BID  . sodium chloride flush  3 mL Intravenous Q12H  . thiamine  100 mg Oral Daily   Or  . thiamine  100 mg Intravenous Daily   Continuous Infusions: . sodium chloride 250 mL (08/05/20 2120)     LOS: 9 days    Time spent: 36 minutes spent on chart review, discussion with nursing staff, consultants, updating family and interview/physical exam; more than 50% of that time was spent in counseling and/or coordination of  care.    Alvira PhilipsEric J [place], DO [facility] Available via Epic secure chat 7am-7pm After these hours, please refer to coverage provider listed on [email or web address] 08/09/2020, 11:19 AM

## 2020-08-10 DIAGNOSIS — Z89511 Acquired absence of right leg below knee: Secondary | ICD-10-CM

## 2020-08-10 DIAGNOSIS — M792 Neuralgia and neuritis, unspecified: Secondary | ICD-10-CM

## 2020-08-10 DIAGNOSIS — Z89512 Acquired absence of left leg below knee: Secondary | ICD-10-CM

## 2020-08-10 LAB — CBC
HCT: 24 % — ABNORMAL LOW (ref 39.0–52.0)
Hemoglobin: 7.5 g/dL — ABNORMAL LOW (ref 13.0–17.0)
MCH: 26.4 pg (ref 26.0–34.0)
MCHC: 31.3 g/dL (ref 30.0–36.0)
MCV: 84.5 fL (ref 80.0–100.0)
Platelets: 395 10*3/uL (ref 150–400)
RBC: 2.84 MIL/uL — ABNORMAL LOW (ref 4.22–5.81)
RDW: 15.1 % (ref 11.5–15.5)
WBC: 7.1 10*3/uL (ref 4.0–10.5)
nRBC: 0 % (ref 0.0–0.2)

## 2020-08-10 LAB — BASIC METABOLIC PANEL
Anion gap: 8 (ref 5–15)
BUN: <5 mg/dL — ABNORMAL LOW (ref 6–20)
CO2: 28 mmol/L (ref 22–32)
Calcium: 8.5 mg/dL — ABNORMAL LOW (ref 8.9–10.3)
Chloride: 101 mmol/L (ref 98–111)
Creatinine, Ser: 0.62 mg/dL (ref 0.61–1.24)
GFR, Estimated: >60 mL/min (ref >60)
Glucose, Bld: 109 mg/dL — ABNORMAL HIGH (ref 70–99)
Potassium: 4.1 mmol/L (ref 3.5–5.1)
Sodium: 137 mmol/L (ref 135–145)

## 2020-08-10 LAB — MAGNESIUM: Magnesium: 1.9 mg/dL (ref 1.7–2.4)

## 2020-08-10 MED ORDER — COVID-19 MRNA VACC (MODERNA) 100 MCG/0.5ML IM SUSP
0.5000 mL | Freq: Once | INTRAMUSCULAR | Status: DC
  Filled 2020-08-10: qty 0.5

## 2020-08-10 MED ORDER — VASOPRESSIN 20 UNIT/ML IV SOLN
INTRAVENOUS | Status: AC
  Filled 2020-08-10: qty 1

## 2020-08-10 MED ORDER — PREGABALIN 75 MG PO CAPS
75.0000 mg | ORAL_CAPSULE | Freq: Three times a day (TID) | ORAL | Status: DC
  Administered 2020-08-10 – 2020-08-12 (×8): 75 mg via ORAL
  Filled 2020-08-10 (×8): qty 1

## 2020-08-10 NOTE — Progress Notes (Signed)
Mobility Specialist: Progress Note   08/10/20 1620  Mobility  Activity Ambulated in hall  Level of Assistance Standby assist, set-up cues, supervision of patient - no hands on  Assistive Device Front wheel walker  Distance Ambulated (ft) 150 ft  Mobility Response Tolerated well  Mobility performed by Mobility specialist  $Mobility charge 1 Mobility   Pre-Mobility: 109 HR During Mobility: 143 HR Post-Mobility: 121 HR, 115/81 BP  Pt stopped for a brief standing break due to hands cramping, otherwise asx. Pt back to bed after walk and is requesting pain medication, RN notified. No pain rating given.   [facility] Jake Nguyen Mobility Specialist Mobility Specialist Phone: [phone]

## 2020-08-10 NOTE — Progress Notes (Signed)
[facility] PROGRESS NOTE  BRYLON BRENNING KGM:[id number] DOB: [birth year] DOA: 07/31/2020 PCP: Patient, No Pcp Per (Inactive)  Status: Remains inpatient appropriate because:Ongoing active pain requiring inpatient pain management and Unsafe d/c plan   Dispo:  Patient From: Home  Planned Disposition: Skilled Nursing Facility  Medically stable for discharge: Yes  Difficult to place patient: Yes     Level of care: Telemetry Medical  Code Status: Full Family Communication: Patient only DVT prophylaxis: Lovenox Vaccination status: Unvaccinated but as of 5/2 has agreed to take the first shot of Moderna    HPI:  55 year old male with past medical history significant for EtOH abuse, left foot gangrene who presented to [facility], ED with worsening left foot pain.  Patient developed left foot gangrene about 1 month ago, evaluated at [facility] with plan for vascular surgery evaluation; however patient left AMA.  He reports being wheelchair-bound for past 2 months because of an bearable left foot pain.  In the ED, his left foot was noted to be with gangrenous changes, was tachycardic without fever.  Lactic acid within normal limits.  WBC within normal limits.  Cova-19 PCR/influenza A/B PCR negative.  Left foot x-ray with progression of soft tissue gas compatible with infection, negative for osteomyelitis.  Vascular surgery consulted.  Hospital service consulted for further evaluation and management.  Since admission patient underwent aortogram that revealed patent superficial femoral artery but unfortunately was occluded with reconstitution of the peroneal artery where it collateralized with the posterior tibial artery.  Patient subsequently underwent BKA.  PT and OT recommending short-term SNF for rehab.  Patient has had significant issues with ongoing postoperative pain manifesting as phantom limb pain as well as shooting neuropathic pain.  In addition during the initial  portion of the hospitalization patient was placed under IVC due to reported suicidal ideation.  Evaluated by psychiatry and was deemed not an imminent risk to self or others therefore IVC rescinded.  Subjective: Alert.  Sitting up in bed.  Multiple questions answered.  Patient reporting intermittent issues with phantom limb pain as well as shooting neuropathic pain.  Also having insomnia secondary to pain.  States also had been using alcohol at home as a sleep aid.  Objective: Vitals:   08/10/20 0856 08/10/20 1201  BP: 103/66 (!) 153/82  Pulse: 100 100  Resp: 15 16  Temp: 98.4 F (36.9 C) 98.7 F (37.1 C)  SpO2: 100% 100%    Intake/Output Summary (Last 24 hours) at 08/10/2020 1602 Last data filed at 08/10/2020 0900 Gross per 24 hour  Intake --  Output 2050 ml  Net -2050 ml   Filed Weights   07/31/20 1300 07/31/20 2135  Weight: 68.9 kg 67.3 kg    Exam:  Constitutional: NAD, calm, uncomfortable Respiratory: clear to auscultation bilaterally, no wheezing, no crackles. Normal respiratory effort.  Cardiovascular: Regular rate and rhythm, no murmurs / rubs / gallops. No extremity edema.  Skin warm and dry Abdomen: no tenderness, no masses palpated. Bowel sounds positive. LBM 4/29 Musculoskeletal: Left BKA.  Surgical site unremarkable Neurologic: CN 2-12 grossly intact. Sensation intact, DTR normal. Strength 5/5 x all 4 extremities.  Adding phantom limb pain at left BKA site Psychiatric: Normal judgment and insight. Alert and oriented x 3. Normal mood.    Assessment/Plan: Acute problems: Left foot gangrene, severe-s/p left BKA 4/27 -- Diagnosed with gangrene of left foot roughly 1 month prior to current admission and subsequently was wheelchair-bound for at least 2 months prior to current admission --  Imaging this admission consistent with soft tissue gangrene -- Imaging also consistent with moderate to severe PAD --Outpatient follow-up with vascular surgery 4 weeks for staple  removal  Peripheral artery disease --Confirmed on imaging this admission  -- Continue atorvastatin and aspirin  Neuropathy/phantom limb pain -- Continue current regimen of Percocet and morphine -- 5/2 discontinued Neurontin in favor of Lyrica  Mood disorder /anxiety/insomnia -- Neurontin discontinued as above -- Continue Seroquel-may need to increase dose if treatment of pain not helpful in decreasing insomnia  Chronic blood loss anemia in the setting of left foot gangrene Baseline hemoglobin around 12.  No overt bleeding. --Hgb 10.5>>8.5>8.1>7.8>7.2 --repeat CBC in am --Transfuse for hemoglobin less than 7.0 or active bleeding  Polysubstance abuse: EtOH and tobacco --Counseled on need for complete cessation.   --Completed Valium taper-tolerated well -- Continue multivitamin, thiamine, folic acid and nicotine patch -- Recommend outpatient substance abuse treatment  Physical deconditioning/ambulatory dysfunction secondary to new BKA --PT and OT recommend SNF for rehabilitative therapies    Other problems: Hypovolemic hyponatremia: Resolved Patient presented with a sodium 132, improved with IV fluid hydration.  Hypomagnesemia Repleted --Repeat electrolytes in a.m.  Hypokalemia: Resolved Repleted --Repeat electrolytes in the a.m.   Data Reviewed: Basic Metabolic Panel: Recent Labs  Lab 08/05/20 0218 08/06/20 0144 08/07/20 0124 08/07/20 0750 08/08/20 0145 08/10/20 0205  NA 136 135 134*  --  135 137  K 3.6 3.8 3.2*  --  3.5 4.1  CL 102 103 101  --  101 101  CO2 25 25 26   --  26 28  GLUCOSE 107* 133* 133*  --  117* 109*  BUN <5* 7 6  --  6 <5*  CREATININE 0.51* 0.53* 0.59*  --  0.61 0.62  CALCIUM 8.2* 7.9* 7.8*  --  7.8* 8.5*  MG  --  1.7  --  1.7 1.9 1.9   CBC: Recent Labs  Lab 08/05/20 0218 08/06/20 0144 08/07/20 0124 08/08/20 0145 08/10/20 0205  WBC 7.5 8.5 9.8 9.1 7.1  HGB 8.5* 8.1* 7.8* 7.2* 7.5*  HCT 27.1* 25.7* 24.3* 22.2* 24.0*  MCV  85.8 86.2 84.7 84.1 84.5  PLT 204 225 264 281 395     Studies: No results found.  Scheduled Meds: . aspirin EC  325 mg Oral Daily  . atorvastatin  40 mg Oral Daily  . COVID-19 mRNA vaccine (Moderna)  0.5 mL Intramuscular Once  . enoxaparin (LOVENOX) injection  40 mg Subcutaneous Q24H  . folic acid  1 mg Oral Daily  . multivitamin with minerals  1 tablet Oral Daily  . nicotine  21 mg Transdermal Daily  . pregabalin  75 mg Oral TID  . senna-docusate  2 tablet Oral BID  . sodium chloride flush  3 mL Intravenous Q12H  . thiamine  100 mg Oral Daily   Or  . thiamine  100 mg Intravenous Daily   Continuous Infusions: . sodium chloride 250 mL (08/05/20 2120)    Active Problems:   Alcohol abuse with alcohol-induced mood disorder (HCC)   Gangrene of foot (HCC)   Gangrene [facility])   Consultants:  Psychiatry  Vascular surgery  Procedures:  Adominal aortogram, 08/04/2020, Dr. 08/06/2020  Left BKA, 08/05/2020, Dr. 08/07/2020   Antibiotics:  Vancomycin 4/22>>  Cefepime 4/24 - 4/28  Zosyn 4/22 - 4/28    Time spent: 35 minutes    5/28 ANP  [facility] 7 am - 330 pm/M-F for direct patient care and secure chat Please refer to [facility]  for contact info 10  days

## 2020-08-10 NOTE — Progress Notes (Addendum)
  Progress Note    08/10/2020 7:38 AM 5 Days Post-Op  Subjective:  Pain in L BKA.  Does not want dressing change today   Vitals:   08/09/20 2215 08/10/20 0724  BP:  107/73  Pulse: 99   Resp: 16 15  Temp: 97.7 F (36.5 C) 98.1 F (36.7 C)  SpO2: 100% 100%    Physical Exam: Incisions:  Exam deferred   CBC    Component Value Date/Time   WBC 7.1 08/10/2020 0205   RBC 2.84 (L) 08/10/2020 0205   HGB 7.5 (L) 08/10/2020 0205   HCT 24.0 (L) 08/10/2020 0205   HCT 36.4 (L) 05/20/2018 1923   PLT 395 08/10/2020 0205   MCV 84.5 08/10/2020 0205   MCH 26.4 08/10/2020 0205   MCHC 31.3 08/10/2020 0205   RDW 15.1 08/10/2020 0205   LYMPHSABS 2.7 07/31/2020 1036   MONOABS 0.7 07/31/2020 1036   EOSABS 0.0 07/31/2020 1036   BASOSABS 0.1 07/31/2020 1036    BMET    Component Value Date/Time   NA 137 08/10/2020 0205   K 4.1 08/10/2020 0205   CL 101 08/10/2020 0205   CO2 28 08/10/2020 0205   GLUCOSE 109 (H) 08/10/2020 0205   BUN <5 (L) 08/10/2020 0205   CREATININE 0.62 08/10/2020 0205   CALCIUM 8.5 (L) 08/10/2020 0205   GFRNONAA >60 08/10/2020 0205   GFRAA >60 11/12/2019 0359    INR    Component Value Date/Time   INR 1.1 08/05/2020 0218     Intake/Output Summary (Last 24 hours) at 08/10/2020 0738 Last data filed at 08/10/2020 4196 Gross per 24 hour  Intake 240 ml  Output 3050 ml  Net -2810 ml     Assessment/Plan:  55 y.o. male is s/p left below knee amputation  5 Days Post-Op  - dressing change later today with nursing staff - try to wean from IV pain medication - Placement is an issue; CSW on board - Office will arrange staple removal in 4 weeks   [name], PA-C [facility] [phone] 08/10/2020 7:38 AM  VASCULAR STAFF ADDENDUM: I agree with the above.  Please call for questions.  Rande Brunt. Lenell Antu, MD [facility] Phone Number: [phone] 08/10/2020 11:41 AM

## 2020-08-10 NOTE — TOC Progression Note (Addendum)
Transition of Care [facility]) - Progression Note    Patient Details  Name: Jake Nguyen MRN: [id number] Date of Birth: 11-24-1965  Transition of Care [facility]) CM/SW Contact  Janae Bridgeman, RN Phone Number: 08/10/2020, 10:45 AM  Clinical Narrative:    Case management spoke with the patient this morning concerning transitions of care to a skilled nursing rehabilitation facility.  The patient is agreeable to placement once an accepting bed offer is available.  The patient states that he continues to have moderate pain to left stump relating to phantom leg pain her Junious Silk, NP and she plans to adjust the patient's medications to better control pain.  The patient has not received COVID vaccine in the past and is agreeable to vaccine at the hospital prior to transfer to an accepting facility.  I spoke with Malena Peer, CM at [facility] this morning and the facility is willing to review the patient's clinicals for possible SNf placement under an LOG.  Initial referral and therapy notes sent to [place], CM at [facility] skilled nursing facility.  CM and MSW on DTP Team will continue to follow the patient for SNF placement.  08/11/2020 1115 - [facility] declined patient for admission at this time.  CM sent a secure email to Jiles Crocker, [facility] supervisor to ask that patient be placed on the PT initiatives list.  The patient currently has not accepting SNF bed offers at this time.   Expected Discharge Plan: Skilled Nursing Facility Barriers to Discharge: Inadequate or no insurance,Continued Medical Work up,No SNF bed  Expected Discharge Plan and Services Expected Discharge Plan: Skilled Nursing Facility In-house Referral: Clinical Social Psychologist, prison and probation services (email sent to [employer] financial counselor to start Medicaid) Discharge Planning Services: CM Consult,Indigent Health Clinic   Living arrangements for the past 2 months: Boarding House                                        Social Determinants of Health (SDOH) Interventions    Readmission Risk Interventions Readmission Risk Prevention Plan 08/10/2020  Transportation Screening Complete  PCP or Specialist Appt within 5-7 Days Complete  Home Care Screening Complete  Medication Review (RN CM) Complete  Some recent data might be hidden

## 2020-08-10 NOTE — Progress Notes (Signed)
Physical Therapy Treatment Patient Details Name: Jake Nguyen MRN: [id number] DOB: [birth year] Today's Date: 08/10/2020    History of Present Illness 55 y.o. male presents to [facility] ED on 07/31/2020 with worsening L foot pain. Pt with history of L foot gangrene for ~1 month, there were plans for vascular surgery at [facility] however pt left AMA. He had LLE angiogram 4/26.  Pt S/P left BKA 4/27.   PMH includes medical history significant of alcohol abuse, left foot gangrene.    PT Comments    Pt progressing well toward is goals.  He does not have a place to go to nor is independent in order to do for himself at this point and will need rehab at SNF to improve independence.  Emphasis on transitions, sit to stand, progression of gait stability/stamina and amputee exercises to work toward prep for prosthetics.    Follow Up Recommendations  SNF;Supervision for mobility/OOB     Equipment Recommendations  Rolling walker with 5" wheels    Recommendations for Other Services       Precautions / Restrictions Precautions Precautions: Fall    Mobility  Bed Mobility Overal bed mobility: Needs Assistance Bed Mobility: Supine to Sit;Sit to Supine     Supine to sit: Supervision Sit to supine: Supervision   General bed mobility comments: controlled transitions with appropriate use of UE's    Transfers Overall transfer level: Needs assistance Equipment used: Rolling walker (2 wheeled) Transfers: Sit to/from Stand Sit to Stand: Min guard   Squat pivot transfers: Min guard     General transfer comment: appropriate use of UE's, but descent a little uncontrolled.  Ambulation/Gait Ambulation/Gait assistance: Min guard Gait Distance (Feet): 120 Feet Assistive device: Rolling walker (2 wheeled)   Gait velocity: reduced Gait velocity interpretation: <1.8 ft/sec, indicate of risk for recurrent falls General Gait Details: generally steady, controlled swing to pattern, cues for better  proximity in the RW   Stairs             Wheelchair Mobility    Modified Rankin (Stroke Patients Only)       Balance     Sitting balance-Leahy Scale: Good     Standing balance support: Bilateral upper extremity supported Standing balance-Leahy Scale: Poor Standing balance comment: reliant on the RW                            Cognition Arousal/Alertness: Awake/alert Behavior During Therapy: WFL for tasks assessed/performed Overall Cognitive Status: Within Functional Limits for tasks assessed                                 General Comments: NT formally      Exercises Amputee Exercises Quad Sets: AROM;Strengthening;Left;10 reps;Supine Hip Extension: AROM;Strengthening;Left;10 reps;Supine Hip ABduction/ADduction: AROM;Strengthening;Left;10 reps;Supine Hip Flexion/Marching: AROM;Strengthening;Left;10 reps;Supine Straight Leg Raises: AROM;Strengthening;Left;10 reps;Supine    General Comments General comments (skin integrity, edema, etc.): vss      Pertinent Vitals/Pain Pain Assessment: Faces Faces Pain Scale: Hurts whole lot Pain Location: L residual limb Pain Descriptors / Indicators: Discomfort;Grimacing;Throbbing Pain Intervention(s): Monitored during session    Home Living                      Prior Function            PT Goals (current goals can now be found in the care plan section) Acute  Rehab PT Goals Patient Stated Goal: to walk PT Goal Formulation: With patient Time For Goal Achievement: 08/22/20 Potential to Achieve Goals: Fair Progress towards PT goals: Progressing toward goals    Frequency    Min 3X/week      PT Plan Current plan remains appropriate    Co-evaluation              AM-PAC PT "6 Clicks" Mobility   Outcome Measure  Help needed turning from your back to your side while in a flat bed without using bedrails?: None Help needed moving from lying on your back to sitting on the  side of a flat bed without using bedrails?: A Little Help needed moving to and from a bed to a chair (including a wheelchair)?: A Little Help needed standing up from a chair using your arms (e.g., wheelchair or bedside chair)?: A Little Help needed to walk in hospital room?: A Little Help needed climbing 3-5 steps with a railing? : A Lot 6 Click Score: 18    End of Session   Activity Tolerance: Patient tolerated treatment well Patient left: in bed;with call bell/phone within reach;with bed alarm set;with family/visitor present Nurse Communication: Mobility status PT Visit Diagnosis: Other abnormalities of gait and mobility (R26.89);Pain;Unsteadiness on feet (R26.81);Difficulty in walking, not elsewhere classified (R26.2) Pain - Right/Left: Left Pain - part of body: Leg     Time: 1131-1155 PT Time Calculation (min) (ACUTE ONLY): 24 min  Charges:  $Gait Training: 8-22 mins $Therapeutic Activity: 8-22 mins                     08/10/2020  Jacinto Halim., PT Acute Rehabilitation Services [phone]  (pager) [phone]  (office)   Jake Nguyen 08/10/2020, 2:33 PM

## 2020-08-11 LAB — IRON AND TIBC
Iron: 13 ug/dL — ABNORMAL LOW (ref 45–182)
Saturation Ratios: 4 % — ABNORMAL LOW (ref 17.9–39.5)
TIBC: 308 ug/dL (ref 250–450)
UIBC: 295 ug/dL

## 2020-08-11 LAB — RETICULOCYTES
Immature Retic Fract: 27.8 % — ABNORMAL HIGH (ref 2.3–15.9)
RBC.: 3.2 MIL/uL — ABNORMAL LOW (ref 4.22–5.81)
Retic Count, Absolute: 43.2 10*3/uL (ref 19.0–186.0)
Retic Ct Pct: 1.4 % (ref 0.4–3.1)

## 2020-08-11 LAB — FERRITIN: Ferritin: 135 ng/mL (ref 24–336)

## 2020-08-11 LAB — VITAMIN B12: Vitamin B-12: 221 pg/mL (ref 180–914)

## 2020-08-11 LAB — FOLATE: Folate: 13 ng/mL (ref >5.9)

## 2020-08-11 MED ORDER — SODIUM CHLORIDE 0.9 % IV SOLN
25.0000 mg | Freq: Once | INTRAVENOUS | Status: AC
  Administered 2020-08-11: 25 mg via INTRAVENOUS
  Filled 2020-08-11: qty 0.5

## 2020-08-11 MED ORDER — SODIUM CHLORIDE 0.9 % IV SOLN
500.0000 mg | Freq: Once | INTRAVENOUS | Status: AC
  Administered 2020-08-11: 500 mg via INTRAVENOUS
  Filled 2020-08-11 (×2): qty 10

## 2020-08-11 MED ORDER — COVID-19 MRNA VACC (MODERNA) 100 MCG/0.5ML IM SUSP
0.5000 mL | Freq: Once | INTRAMUSCULAR | Status: AC
  Administered 2020-08-11: 0.5 mL via INTRAMUSCULAR
  Filled 2020-08-11: qty 0.5

## 2020-08-11 MED ORDER — FERROUS SULFATE 325 (65 FE) MG PO TABS
325.0000 mg | ORAL_TABLET | Freq: Two times a day (BID) | ORAL | Status: DC
  Administered 2020-08-11 – 2020-08-27 (×32): 325 mg via ORAL
  Filled 2020-08-11 (×32): qty 1

## 2020-08-11 NOTE — Progress Notes (Signed)
[facility] PROGRESS NOTE  Jake Nguyen UXL:[id number] DOB: [birth year] DOA: 07/31/2020 PCP: Patient, No Pcp Per (Inactive)  Status: Remains inpatient appropriate because:Ongoing active pain requiring inpatient pain management and Unsafe d/c plan   Dispo:  Patient From: Home  Planned Disposition: Skilled Nursing Facility  Medically stable for discharge: Yes  Difficult to place patient: Yes   Level of care: Telemetry Medical  Code Status: Full Family Communication: Patient only DVT prophylaxis: Lovenox Vaccination status: Unvaccinated but as of 5/2 has agreed to take the first shot of Moderna    HPI:  55 year old male with past medical history significant for EtOH abuse, left foot gangrene who presented to [facility], ED with worsening left foot pain.  Patient developed left foot gangrene about 1 month ago, evaluated at [facility] with plan for vascular surgery evaluation; however patient left AMA.  He reports being wheelchair-bound for past 2 months because of an bearable left foot pain.  In the ED, his left foot was noted to be with gangrenous changes, was tachycardic without fever.  Lactic acid within normal limits.  WBC within normal limits.  Cova-19 PCR/influenza A/B PCR negative.  Left foot x-ray with progression of soft tissue gas compatible with infection, negative for osteomyelitis.  Vascular surgery consulted.  Hospital service consulted for further evaluation and management.  Since admission patient underwent aortogram that revealed patent superficial femoral artery but unfortunately was occluded with reconstitution of the peroneal artery where it collateralized with the posterior tibial artery.  Patient subsequently underwent BKA.  PT and OT recommending short-term SNF for rehab.  Patient has had significant issues with ongoing postoperative pain manifesting as phantom limb pain as well as shooting neuropathic pain.  In addition during the initial portion  of the hospitalization patient was placed under IVC due to reported suicidal ideation.  Evaluated by psychiatry and was deemed not an imminent risk to self or others therefore IVC rescinded.  Subjective: Awake.  States improvement in neuropathic and phantom limb pain.  Continues to have significant discomfort with palpation over distal aspect of stump.  Objective: Vitals:   08/11/20 0147 08/11/20 0410  BP: 97/61 99/65  Pulse: (!) 105 99  Resp: 15 16  Temp: 98.9 F (37.2 C) 98.7 F (37.1 C)  SpO2: 96% 95%    Intake/Output Summary (Last 24 hours) at 08/11/2020 0818 Last data filed at 08/10/2020 2155 Gross per 24 hour  Intake 250 ml  Output 1550 ml  Net -1300 ml   Filed Weights   07/31/20 1300 07/31/20 2135  Weight: 68.9 kg 67.3 kg    Exam:  Constitutional: Awake alert and in no acute distress unless stump palpated Respiratory: Lungs are clear, stable on room air Cardiovascular: Normal heart sounds, no peripheral edema, no tachycardia. Abdomen: LBM 5/1-soft and nontender, bowel sounds are normoactive.  Eating well. Musculoskeletal: Left BKA.  Surgical site dressing removed by nurse who states was stuck to the wound and now patient has some minor incisional oozing of blood and serous fluid Neurologic: Cranial nerves are intact, moves all extremities x4 although left leg somewhat limited by pain and BKA. Psychiatric: Alert and oriented x3, appropriate affect   Assessment/Plan: Acute problems: Left foot gangrene, severe-s/p left BKA 4/27 -- Diagnosed with gangrene of left foot roughly 1 month prior to current admission and subsequently was wheelchair-bound for at least 2 months prior to current admission -- Imaging this admission consistent with soft tissue gangrene and additional imaging also consistent with moderate to severe PAD --  Outpatient follow-up with vascular surgery 4 weeks for staple removal  Peripheral artery disease --Confirmed on imaging this admission  --  Continue atorvastatin and aspirin  Neuropathy/phantom limb pain -- Continue current regimen of Percocet and morphine --Continue Lyrica  Mood disorder /anxiety/insomnia -- Continue Seroquel-may need to increase dose if treatment of pain not helpful in decreasing insomnia  Chronic blood loss anemia in the setting of left foot gangrene Baseline hemoglobin around 12.  No overt bleeding. --Hgb 10.5>>8.5>8.1>7.8>7.2 > 7.5 --Anemia panel reveals profoundly low iron of 13 with a saturation of 4-we will give 1 dose of IV iron with a test dose and begin oral iron replacement on 5/4. --Transfuse for hemoglobin less than 7.0 or active bleeding  Polysubstance abuse: EtOH and tobacco  --Completed Valium taper-tolerated well -- Continue multivitamin, thiamine, folic acid and nicotine patch -- Recommend outpatient substance abuse treatment  Physical deconditioning/ambulatory dysfunction secondary to new BKA --PT and OT recommend SNF for rehabilitative therapies    Other problems: Hypovolemic hyponatremia: Resolved Patient presented with a sodium 132, improved with IV fluid hydration.  Hypomagnesemia Resolved --Follow lab prn   Hypokalemia: Resolved Resolved --Follow lab prn    Data Reviewed: Basic Metabolic Panel: Recent Labs  Lab 08/05/20 0218 08/06/20 0144 08/07/20 0124 08/07/20 0750 08/08/20 0145 08/10/20 0205  NA 136 135 134*  --  135 137  K 3.6 3.8 3.2*  --  3.5 4.1  CL 102 103 101  --  101 101  CO2 25 25 26   --  26 28  GLUCOSE 107* 133* 133*  --  117* 109*  BUN <5* 7 6  --  6 <5*  CREATININE 0.51* 0.53* 0.59*  --  0.61 0.62  CALCIUM 8.2* 7.9* 7.8*  --  7.8* 8.5*  MG  --  1.7  --  1.7 1.9 1.9   CBC: Recent Labs  Lab 08/05/20 0218 08/06/20 0144 08/07/20 0124 08/08/20 0145 08/10/20 0205  WBC 7.5 8.5 9.8 9.1 7.1  HGB 8.5* 8.1* 7.8* 7.2* 7.5*  HCT 27.1* 25.7* 24.3* 22.2* 24.0*  MCV 85.8 86.2 84.7 84.1 84.5  PLT 204 225 264 281 395     Studies: No  results found.  Scheduled Meds: . aspirin EC  325 mg Oral Daily  . atorvastatin  40 mg Oral Daily  . COVID-19 mRNA vaccine (Moderna)  0.5 mL Intramuscular ONCE-1600  . enoxaparin (LOVENOX) injection  40 mg Subcutaneous Q24H  . folic acid  1 mg Oral Daily  . multivitamin with minerals  1 tablet Oral Daily  . nicotine  21 mg Transdermal Daily  . pregabalin  75 mg Oral TID  . senna-docusate  2 tablet Oral BID  . sodium chloride flush  3 mL Intravenous Q12H  . thiamine  100 mg Oral Daily   Or  . thiamine  100 mg Intravenous Daily   Continuous Infusions: . sodium chloride 250 mL (08/05/20 2120)    Active Problems:   Alcohol abuse with alcohol-induced mood disorder (HCC)   Gangrene of foot (HCC)   Gangrene (HCC)   S/P BKA (below knee amputation) unilateral, left (HCC)   Neuropathic pain, leg, left   Consultants:  Psychiatry  Vascular surgery  Procedures:  Adominal aortogram, 08/04/2020, Dr. 08/06/2020  Left BKA, 08/05/2020, Dr. 08/07/2020   Antibiotics:  Vancomycin 4/22>>  Cefepime 4/24 - 4/28  Zosyn 4/22 - 4/28    Time spent: 35 minutes    5/28 ANP  [facility] 7 am - 330 pm/M-F for direct  patient care and secure chat Please refer to Amion for contact info 11  days

## 2020-08-11 NOTE — Progress Notes (Signed)
Mobility Specialist: Progress Note   08/11/20 1419  Mobility  Activity Ambulated in hall  Level of Assistance Contact guard assist, steadying assist  Assistive Device Front wheel walker  Distance Ambulated (ft) 160 ft  Mobility Response Tolerated well  Mobility performed by Mobility specialist  $Mobility charge 1 Mobility   During Mobility: 149 HR Post-Mobility: 133 HR, 114/76 BP  Pt c/o pain in stump during ambulation, no rating given. Pt stopped to take two brief standing break due to his hands cramping. Pt sitting EOB after walk and is requesting pain medication, RN notified.   [facility] Ayahna Solazzo Mobility Specialist Mobility Specialist Phone: [phone]

## 2020-08-11 NOTE — Progress Notes (Signed)
Occupational Therapy Treatment Patient Details Name: Jake Nguyen MRN: [id number] DOB: 1966/01/02 Today's Date: 08/11/2020    History of present illness 55 y.o. male presents to [facility] ED on 07/31/2020 with worsening L foot pain. Pt with history of L foot gangrene for ~1 month, there were plans for vascular surgery at [facility] however pt left AMA. He had LLE angiogram 4/26.  Pt S/P left BKA 4/27.   PMH includes medical history significant of alcohol abuse, left foot gangrene.   OT comments  Pt making steady progress towards OT goals this session. Pt continues to present with increased pain in LLE but agreeable to OT intervention. Pt currently requires min guard assist for functional mobility greater than a household distance with RW, and set- up assist for seated grooming tasks from EOB. Pt HR increase to as much as 142 bpm with pt asymptomatic. Pt reports pain/ cramping in hands when using RW, applied palm guard+ ace wrap to cushion grips on RW. Pt would continue to benefit from skilled occupational therapy while admitted and after d/c to address the below listed limitations in order to improve overall functional mobility and facilitate independence with BADL participation. DC plan remains appropriate, will follow acutely per POC.     Follow Up Recommendations  SNF;Supervision/Assistance - 24 hour;Supervision - Intermittent    Equipment Recommendations  Other (comment) (TBD depending on d/c venue)    Recommendations for Other Services      Precautions / Restrictions Precautions Precautions: Fall Precaution Comments: tachycardia, s/p L BKA Restrictions Weight Bearing Restrictions: Yes LLE Weight Bearing: Non weight bearing       Mobility Bed Mobility Overal bed mobility: Needs Assistance Bed Mobility: Supine to Sit;Sit to Supine     Supine to sit: Modified independent (Device/Increase time) Sit to supine: Modified independent (Device/Increase time)   General bed mobility  comments: no physcial assist needed, increased time and effort    Transfers Overall transfer level: Needs assistance Equipment used: Rolling walker (2 wheeled) Transfers: Sit to/from Stand Sit to Stand: Min guard         General transfer comment: good hand placement noted, min guard for safety to rise from EOB    Balance Overall balance assessment: Needs assistance Sitting-balance support: No upper extremity supported;Feet supported Sitting balance-Leahy Scale: Good Sitting balance - Comments: sitting EOB with no LOB or BUE support   Standing balance support: Bilateral upper extremity supported Standing balance-Leahy Scale: Poor Standing balance comment: reliant on the RW                           ADL either performed or assessed with clinical judgement   ADL Overall ADL's : Needs assistance/impaired     Grooming: Wash/dry face;Oral care;Sitting;Supervision/safety;Set up Grooming Details (indicate cue type and reason): sitting EOB         Upper Body Dressing : Set up;Sitting Upper Body Dressing Details (indicate cue type and reason): to don posterior gown from EOB     Toilet Transfer: Min guard;Ambulation;RW Toilet Transfer Details (indicate cue type and reason): simulated via functional mobility         Functional mobility during ADLs: Min guard;Rolling walker General ADL Comments: Pt continues to present with increased pain, impaired activity tolerance and impaired balance. pt reports pain in hands from using BUEs on Rw- applied palm guards with ace wraps to decrease risk cramping and pain in hands during functional mobility     Vision  Perception     Praxis      Cognition Arousal/Alertness: Awake/alert Behavior During Therapy: WFL for tasks assessed/performed Overall Cognitive Status: Within Functional Limits for tasks assessed                                 General Comments: distracted by pain at times but following  commands and agreeable to session        Exercises     Shoulder Instructions       General Comments noted drainage from staples upon arrival, RN enter to dress incision, at end of session pt complain that dressing is too tight, this COTA rewrappred residual limb and applied piece of tape over velcro as pt reprots it always comes "undone". also applied palm gaurds to RW with ace wrap as pt reports his hands cramp when using RW    Pertinent Vitals/ Pain       Pain Assessment: 0-10 Pain Score: 8  Pain Location: L residual limb Pain Descriptors / Indicators: Discomfort;Grimacing;Throbbing Pain Intervention(s): Limited activity within patient's tolerance;Monitored during session;Repositioned  Home Living                                          Prior Functioning/Environment              Frequency  Min 2X/week        Progress Toward Goals  OT Goals(current goals can now be found in the care plan section)  Progress towards OT goals: Progressing toward goals  Acute Rehab OT Goals Patient Stated Goal: to have less pain OT Goal Formulation: With patient Time For Goal Achievement: 08/21/20 Potential to Achieve Goals: Good  Plan Discharge plan remains appropriate;Frequency remains appropriate    Co-evaluation                 AM-PAC OT "6 Clicks" Daily Activity     Outcome Measure   Help from another person eating meals?: None Help from another person taking care of personal grooming?: None Help from another person toileting, which includes using toliet, bedpan, or urinal?: A Little Help from another person bathing (including washing, rinsing, drying)?: A Little Help from another person to put on and taking off regular upper body clothing?: None Help from another person to put on and taking off regular lower body clothing?: A Little 6 Click Score: 21    End of Session Equipment Utilized During Treatment: Gait belt;Rolling walker  OT Visit  Diagnosis: Pain;Muscle weakness (generalized) (M62.81);Other abnormalities of gait and mobility (R26.89) Pain - Right/Left: Left   Activity Tolerance Patient tolerated treatment well   Patient Left in bed;with call bell/phone within reach;with bed alarm set   Nurse Communication Mobility status        Time: 0928-1000 OT Time Calculation (min): 32 min  Charges: OT General Charges $OT Visit: 1 Visit OT Treatments $Self Care/Home Management : 23-37 mins  Lenor Derrick., COTA/L Acute Rehabilitation Services [phone]    Barron Schmid 08/11/2020, 1:00 PM

## 2020-08-12 NOTE — Progress Notes (Addendum)
CSW sent patient's clinical information to [facility], and [employer] in [place] for review.  Edwin Dada, MSW, LCSW Transitions of Care  Clinical Social Worker II [phone]

## 2020-08-12 NOTE — Progress Notes (Signed)
[facility] PROGRESS NOTE  Jake Nguyen ALP:[id number] DOB: 11-21-1965 DOA: 07/31/2020 PCP: Patient, No Pcp Per (Inactive)  Status: Remains inpatient appropriate because:Ongoing active pain requiring inpatient pain management and Unsafe d/c plan   Dispo:  Patient From: Home  Planned Disposition: Skilled Nursing Facility  Medically stable for discharge: Yes  Difficult to place patient: Yes   Level of care: Telemetry Medical  Code Status: Full Family Communication: Patient only DVT prophylaxis: Lovenox Vaccination status: Unvaccinated but as of 5/2 has agreed to take the first shot of Moderna    HPI:  55 year old male with past medical history significant for EtOH abuse, left foot gangrene who presented to [facility], ED with worsening left foot pain.  Patient developed left foot gangrene about 1 month ago, evaluated at [facility] with plan for vascular surgery evaluation; however patient left AMA.  He reports being wheelchair-bound for past 2 months because of an bearable left foot pain.  In the ED, his left foot was noted to be with gangrenous changes, was tachycardic without fever.  Lactic acid within normal limits.  WBC within normal limits.  Cova-19 PCR/influenza A/B PCR negative.  Left foot x-ray with progression of soft tissue gas compatible with infection, negative for osteomyelitis.  Vascular surgery consulted.  Hospital service consulted for further evaluation and management.  Since admission patient underwent aortogram that revealed patent superficial femoral artery but unfortunately was occluded with reconstitution of the peroneal artery where it collateralized with the posterior tibial artery.  Patient subsequently underwent BKA.  PT and OT recommending short-term SNF for rehab.  Patient has had significant issues with ongoing postoperative pain manifesting as phantom limb pain as well as shooting neuropathic pain.  In addition during the initial portion  of the hospitalization patient was placed under IVC due to reported suicidal ideation.  Evaluated by psychiatry and was deemed not an imminent risk to self or others therefore IVC rescinded.  Subjective: Awakened.  Family member in room.  Patient reports continued adequate pain control.  No further bleeding from stump site.  Objective: Vitals:   08/12/20 0019 08/12/20 0503  BP: 98/70 95/62  Pulse: (!) 109   Resp: 19 18  Temp: 98.4 F (36.9 C) 98.4 F (36.9 C)  SpO2: 97% 96%    Intake/Output Summary (Last 24 hours) at 08/12/2020 0803 Last data filed at 08/12/2020 0538 Gross per 24 hour  Intake 500.76 ml  Output 2750 ml  Net -2249.24 ml   Filed Weights   07/31/20 1300 07/31/20 2135  Weight: 68.9 kg 67.3 kg    Exam:  Constitutional: Awakened, calm, no acute distress Respiratory: Anterior lung sounds are clear.  Room air Cardiovascular: Normal heart sounds, regular pulse, skin warm and dry, no peripheral edema. Abdomen: LBM 5/3-soft nontender nondistended with normoactive bowel sounds Musculoskeletal: Left BKA.  Surgical site dry and intact Neurologic: Cranial nerves II through XII grossly intact.  Continues to have neuropathic pain and left lower extremity at BKA site.  Ambulation limited by new BKA Psychiatric: Alert and oriented x3, pleasant affect   Assessment/Plan: Acute problems: Left foot gangrene, severe-s/p left BKA 4/27 -- Imaging this admission consistent with soft tissue gangrene and additional imaging also consistent with moderate to severe PAD --OP FU w/ vascular surgery in 4 weeks for staple removal  Peripheral artery disease --Confirmed on imaging this admission  -- Continue atorvastatin and aspirin  Neuropathy/phantom limb pain -- Continue current regimen of Lyrica, Percocet and IV morphine  Mood disorder /anxiety/insomnia --  Continue Seroquel-may need to increase dose if treatment of pain not helpful in decreasing insomnia  Chronic blood loss anemia  in the setting of left foot gangrene Baseline hemoglobin around 12 --Hgb 10.5>>8.5>8.1>7.8>7.2 > 7.5 --Iron 13 with a saturation of 4-given IV iron followed by oral iron  --Transfuse for hemoglobin less than 7.0 or active bleeding  Polysubstance abuse: EtOH and tobacco  --Completed Valium taper-tolerated well -- Continue multivitamin, thiamine, folic acid and nicotine patch -- Recommend outpatient substance abuse treatment  Physical deconditioning/ambulatory dysfunction secondary to new BKA --PT and OT recommend SNF for rehabilitative therapies -- Was deconditioned prior to admission noting was wheelchair-bound for 2 months prior to admission    Other problems: Hypovolemic hyponatremia: Resolved Patient presented with a sodium 132, improved with IV fluid hydration.  Hypomagnesemia Resolved --Follow lab prn   Hypokalemia: Resolved Resolved --Follow lab prn    Data Reviewed: Basic Metabolic Panel: Recent Labs  Lab 08/06/20 0144 08/07/20 0124 08/07/20 0750 08/08/20 0145 08/10/20 0205  NA 135 134*  --  135 137  K 3.8 3.2*  --  3.5 4.1  CL 103 101  --  101 101  CO2 25 26  --  26 28  GLUCOSE 133* 133*  --  117* 109*  BUN 7 6  --  6 <5*  CREATININE 0.53* 0.59*  --  0.61 0.62  CALCIUM 7.9* 7.8*  --  7.8* 8.5*  MG 1.7  --  1.7 1.9 1.9   CBC: Recent Labs  Lab 08/06/20 0144 08/07/20 0124 08/08/20 0145 08/10/20 0205  WBC 8.5 9.8 9.1 7.1  HGB 8.1* 7.8* 7.2* 7.5*  HCT 25.7* 24.3* 22.2* 24.0*  MCV 86.2 84.7 84.1 84.5  PLT 225 264 281 395     Studies: No results found.  Scheduled Meds: . aspirin EC  325 mg Oral Daily  . atorvastatin  40 mg Oral Daily  . enoxaparin (LOVENOX) injection  40 mg Subcutaneous Q24H  . ferrous sulfate  325 mg Oral BID WC  . folic acid  1 mg Oral Daily  . multivitamin with minerals  1 tablet Oral Daily  . nicotine  21 mg Transdermal Daily  . pregabalin  75 mg Oral TID  . senna-docusate  2 tablet Oral BID  . sodium chloride  flush  3 mL Intravenous Q12H  . thiamine  100 mg Oral Daily   Or  . thiamine  100 mg Intravenous Daily   Continuous Infusions: . sodium chloride 250 mL (08/05/20 2120)    Active Problems:   Alcohol abuse with alcohol-induced mood disorder (HCC)   Gangrene of foot (HCC)   Gangrene (HCC)   S/P BKA (below knee amputation) unilateral, left (HCC)   Neuropathic pain, leg, left   Consultants:  Psychiatry  Vascular surgery  Procedures:  Adominal aortogram, 08/04/2020, Dr. Myra Gianotti  Left BKA, 08/05/2020, Dr. Myra Gianotti   Antibiotics:  Vancomycin 4/22>>  Cefepime 4/24 - 4/28  Zosyn 4/22 - 4/28    Time spent: 20 minutes    Junious Silk ANP  [facility] 7 am - 330 pm/M-F for direct patient care and secure chat Please refer to Amion for contact info 12  days

## 2020-08-12 NOTE — NC FL2 (Signed)
[place] MEDICAID FL2 LEVEL OF CARE SCREENING TOOL     IDENTIFICATION  Patient Name: Jake Nguyen Birthdate: January 06, 1966 Sex: male Admission Date (Current Location): 07/31/2020  [facility] and [place] Number:  Producer, television/film/video and Address:  [facility]. [facility], [address], [place], [place] [address]      Provider Number: [id number]  Attending Physician Name and Address:  Joseph Art, DO  Relative Name and Phone Number:       Current Level of Care: Hospital Recommended Level of Care: Skilled Nursing Facility Prior Approval Number:    Date Approved/Denied:   PASRR Number: [id number]  Discharge Plan: SNF    Current Diagnoses: Patient Active Problem List   Diagnosis Date Noted  . S/P BKA (below knee amputation) unilateral, left (HCC)   . Neuropathic pain, leg, left   . Gangrene (HCC) 07/31/2020  . Gangrene of foot (HCC) 07/14/2020  . Ataxia   . Alcohol abuse with alcohol-induced mood disorder (HCC) 11/10/2019  . Alcoholism /alcohol abuse   . Alcohol withdrawal syndrome, with delirium (HCC) 11/06/2019  . Pressure injury of skin 05/25/2018  . Rhabdomyolysis 05/20/2018  . Hyponatremia 05/20/2018  . Elevated troponin 05/20/2018  . Moderate dehydration 05/20/2018  . Alcohol use 05/20/2018  . Influenza A 05/20/2018  . Transaminitis 05/20/2018    Orientation RESPIRATION BLADDER Height & Weight     Self,Time,Situation,Place  Normal Continent Weight: 148 lb 5.9 oz (67.3 kg) Height:  6' (182.9 cm)  BEHAVIORAL SYMPTOMS/MOOD NEUROLOGICAL BOWEL NUTRITION STATUS      Continent Diet  AMBULATORY STATUS COMMUNICATION OF NEEDS Skin   Extensive Assist Verbally Surgical wounds ([place], left BKA 4/27)                       Personal Care Assistance Level of Assistance  Feeding,Bathing,Dressing Bathing Assistance: Maximum assistance Feeding assistance: Independent Dressing Assistance: Limited assistance     Functional Limitations Info   Sight,Speech,Hearing Sight Info: Adequate Hearing Info: Adequate Speech Info: Adequate    SPECIAL CARE FACTORS FREQUENCY  OT (By licensed OT),PT (By licensed PT)     PT Frequency: 5x weekly OT Frequency: 5x weekly            Contractures Contractures Info: Not present    Additional Factors Info  Allergies   Allergies Info: Pork derived products           Current Medications (08/12/2020):  This is the current hospital active medication list Current Facility-Administered Medications  Medication Dose Route Frequency Provider Last Rate Last Admin  . 0.9 %  sodium chloride infusion  250 mL Intravenous PRN Milinda Antis, PA-C 10 mL/hr at 08/05/20 2120 250 mL at 08/05/20 2120  . acetaminophen (TYLENOL) tablet 650 mg  650 mg Oral Q6H PRN Milinda Antis, PA-C   650 mg at 08/05/20 7371   Or  . acetaminophen (TYLENOL) suppository 650 mg  650 mg Rectal Q6H PRN Milinda Antis, PA-C      . aspirin EC tablet 325 mg  325 mg Oral Daily Milinda Antis, PA-C   325 mg at 08/12/20 0809  . atorvastatin (LIPITOR) tablet 40 mg  40 mg Oral Daily Milinda Antis, PA-C   40 mg at 08/12/20 0810  . enoxaparin (LOVENOX) injection 40 mg  40 mg Subcutaneous Q24H Scarlett Presto, RPH   40 mg at 08/12/20 0626  . ferrous sulfate tablet 325 mg  325 mg Oral BID WC Junious Silk  L, NP   325 mg at 08/12/20 0810  . folic acid (FOLVITE) tablet 1 mg  1 mg Oral Daily Milinda Antis, PA-C   1 mg at 08/12/20 4315  . hydrALAZINE (APRESOLINE) injection 5 mg  5 mg Intravenous Q20 Min PRN Milinda Antis, PA-C      . labetalol (NORMODYNE) injection 10 mg  10 mg Intravenous Q10 min PRN Setzer, Lynnell Jude, PA-C      . methocarbamol (ROBAXIN) tablet 500 mg  500 mg Oral Q6H PRN [place], Eric J, DO   500 mg at 08/11/20 2024  . morphine 2 MG/ML injection 2 mg  2 mg Intravenous Q2H PRN Milinda Antis, PA-C   2 mg at 08/10/20 2155  . multivitamin with minerals tablet 1 tablet  1 tablet Oral Daily Milinda Antis,  PA-C   1 tablet at 08/12/20 0810  . naloxone [facility]) injection 0.4 mg  0.4 mg Intravenous PRN Milinda Antis, PA-C      . nicotine (NICODERM CQ - dosed in mg/24 hours) patch 21 mg  21 mg Transdermal Daily Milinda Antis, PA-C   21 mg at 08/12/20 4008  . ondansetron (ZOFRAN) tablet 4 mg  4 mg Oral Q6H PRN Setzer, Lynnell Jude, PA-C       Or  . ondansetron [facility]) injection 4 mg  4 mg Intravenous Q6H PRN Milinda Antis, PA-C   4 mg at 08/02/20 0847  . oxyCODONE-acetaminophen (PERCOCET/ROXICET) 5-325 MG per tablet 1-2 tablet  1-2 tablet Oral Q4H PRN Milinda Antis, PA-C   2 tablet at 08/12/20 0809  . pregabalin (LYRICA) capsule 75 mg  75 mg Oral TID Russella Dar, NP   75 mg at 08/12/20 0809  . QUEtiapine (SEROQUEL) tablet 25 mg  25 mg Oral QHS PRN Milinda Antis, PA-C   25 mg at 08/11/20 2121  . senna-docusate (Senokot-S) tablet 2 tablet  2 tablet Oral BID Milinda Antis, PA-C   2 tablet at 08/11/20 0848  . sodium chloride flush (NS) 0.9 % injection 3 mL  3 mL Intravenous Q12H Milinda Antis, PA-C   3 mL at 08/12/20 6761  . sodium chloride flush (NS) 0.9 % injection 3 mL  3 mL Intravenous PRN Milinda Antis, PA-C   3 mL at 08/05/20 0152  . thiamine tablet 100 mg  100 mg Oral Daily Milinda Antis, PA-C   100 mg at 08/12/20 9509   Or  . thiamine (B-1) injection 100 mg  100 mg Intravenous Daily Milinda Antis, PA-C   100 mg at 08/03/20 3267     Discharge Medications: Please see discharge summary for a list of discharge medications.  Relevant Imaging Results:  Relevant Lab Results:   Additional Information SSN: [id number]  Inis Sizer, LCSW

## 2020-08-12 NOTE — Progress Notes (Signed)
Mobility Specialist - Progress Note   08/12/20 1437  Mobility  Activity Refused mobility   Pt refused stating he has already worked w/ PT today and doesn't want to be back in pain. He refused walking a short distance or any mobility at the bedside as well.   Mamie Levers Mobility Specialist Mobility Specialist Phone: [phone]

## 2020-08-12 NOTE — Progress Notes (Signed)
Physical Therapy Treatment Patient Details Name: Jake Nguyen MRN: [id number] DOB: 02-11-1966 Today's Date: 08/12/2020    History of Present Illness 55 y.o. male presents to [facility] ED on 07/31/2020 with worsening L foot pain. Pt with history of L foot gangrene for ~1 month, there were plans for vascular surgery at [facility] however pt left AMA. He had LLE angiogram 4/26.  Pt S/P left BKA 4/27. PMH includes medical history significant of alcohol abuse, left foot gangrene.    PT Comments    Pt received in supine, agreeable to therapy session with encouragement and with good participation and tolerance for exercises in supine/standing postures and short hallway gait trial for strengthening using RW. He was noted to have his LLE propped up on pillow upon PTA arrival to room and this therapist reinforced with pt and RN that he is not to place pillows under residual limb so as to prevent knee contraction. Also reviewed techniques to reduce phantom limb pain and importance of pressure offloading. Pt refusing transfer to chair at this time. He needs up to [facility] for dynamic standing balance and min guard for transfer safety from bed height. Pt continues to benefit from PT services to progress toward functional mobility goals. Continue to recommend SNF, will plan to initiate stair training next session in case placement unable to be found.  Follow Up Recommendations  SNF;Supervision for mobility/OOB     Equipment Recommendations  Rolling walker with 5" wheels    Recommendations for Other Services       Precautions / Restrictions Precautions Precautions: Fall Precaution Comments: tachycardia, s/p L BKA Restrictions Weight Bearing Restrictions: Yes LLE Weight Bearing: Non weight bearing    Mobility  Bed Mobility Overal bed mobility: Needs Assistance Bed Mobility: Supine to Sit;Sit to Supine     Supine to sit: Modified independent (Device/Increase time) Sit to supine: Modified independent  (Device/Increase time)   General bed mobility comments: no physical assist needed, increased time and effort    Transfers Overall transfer level: Needs assistance Equipment used: Rolling walker (2 wheeled) Transfers: Sit to/from Stand Sit to Stand: Min guard;Min assist         General transfer comment: good hand placement noted, min guard for safety to rise from EOB but needs up to minA for stability upon standing at RW  Ambulation/Gait Ambulation/Gait assistance: Min guard Gait Distance (Feet): 100 Feet (including x2 standing breaks (~1ft x3)) Assistive device: Rolling walker (2 wheeled) Gait Pattern/deviations:  (swing-to pattern, forward head posture) Gait velocity: reduced   General Gait Details: HR elevated 130's bpm, no dizziness, fair use of RW, cues for activity pacing/standing breaks when arms fatigue   Stairs             Wheelchair Mobility    Modified Rankin (Stroke Patients Only)       Balance Overall balance assessment: Needs assistance Sitting-balance support: No upper extremity supported;Feet supported Sitting balance-Leahy Scale: Good Sitting balance - Comments: sitting EOB with no LOB or BUE support   Standing balance support: Bilateral upper extremity supported Standing balance-Leahy Scale: Poor Standing balance comment: reliant on the RW                            Cognition Arousal/Alertness: Awake/alert Behavior During Therapy: WFL for tasks assessed/performed Overall Cognitive Status: Within Functional Limits for tasks assessed Area of Impairment: Safety/judgement;Problem solving  Safety/Judgement: Decreased awareness of safety   Problem Solving: Requires verbal cues (visual cues also helpful for him) General Comments: distracted by pain at times but following commands and agreeable to session      Exercises Amputee Exercises Quad Sets: AROM;Strengthening;Left;10 reps;Supine Hip  Extension: AROM;Strengthening;Left;10 reps;Standing (at RW) Hip ABduction/ADduction: AROM;Strengthening;Left;10 reps;Standing (at RW) Hip Flexion/Marching: AROM;Strengthening;Left;10 reps;Standing Knee Flexion: AROM;Left;10 reps;Seated Knee Extension: AROM;Left;10 reps;Seated Chair Push Up:  (visual demo but pt defers to attempt this date) Other Exercises Other Exercises: portion of gait billed as TE for UE/RLE strengthening    General Comments General comments (skin integrity, edema, etc.): HR elevated to 140 bpm with mobility (mostly 130's bpm standing), SpO2 WNL on RA. Pt with knee bent up on 2 pillows on PTA arrival to room and he was given education on importance of keeping L knee straight/not propped up on pillows and also phantom pain reduction techniques      Pertinent Vitals/Pain Pain Assessment: Faces Faces Pain Scale: Hurts even more Pain Location: L residual limb Pain Descriptors / Indicators: Discomfort;Grimacing;Throbbing Pain Intervention(s): Monitored during session;Repositioned           PT Goals (current goals can now be found in the care plan section) Acute Rehab PT Goals Patient Stated Goal: to have less pain and get out of the hospital soon PT Goal Formulation: With patient Time For Goal Achievement: 08/22/20 Potential to Achieve Goals: Fair Progress towards PT goals: Progressing toward goals    Frequency    Min 3X/week      PT Plan Current plan remains appropriate       AM-PAC PT "6 Clicks" Mobility   Outcome Measure  Help needed turning from your back to your side while in a flat bed without using bedrails?: None Help needed moving from lying on your back to sitting on the side of a flat bed without using bedrails?: None Help needed moving to and from a bed to a chair (including a wheelchair)?: A Little Help needed standing up from a chair using your arms (e.g., wheelchair or bedside chair)?: A Little Help needed to walk in hospital room?: A  Little Help needed climbing 3-5 steps with a railing? : A Lot 6 Click Score: 19    End of Session Equipment Utilized During Treatment: Gait belt Activity Tolerance: Patient tolerated treatment well Patient left: in bed;with call bell/phone within reach;with bed alarm set (NO pillow under L knee) Nurse Communication: Mobility status;Other (comment) (NO pillow under L knee) PT Visit Diagnosis: Other abnormalities of gait and mobility (R26.89);Pain;Unsteadiness on feet (R26.81);Difficulty in walking, not elsewhere classified (R26.2) Pain - Right/Left: Left Pain - part of body: Leg     Time: 1100-1119 PT Time Calculation (min) (ACUTE ONLY): 19 min  Charges:  $Therapeutic Exercise: 8-22 mins                     Hatim Homann P., PTA Acute Rehabilitation Services Pager: [phone] Office: [phone]   Angus Palms 08/12/2020, 11:47 AM

## 2020-08-12 NOTE — Discharge Instructions (Signed)
Leg Amputation        Leg amputation is a procedure to remove the leg. You may have this procedure if your leg has been affected by a disease, such as peripheral vascular disease or severe circulation disease, and is causing problems. There are three types of leg amputation:  Above-the-knee amputation (transfemoral amputation). This type is done to remove the leg from above the knee.  Below-the-knee amputation (transtibial amputation). This type is done to remove the leg from below the knee.  Through-knee amputation. This type is done to remove the leg at the knee joint. Tell a health care provider about:  Any allergies you have.  All medicines you are taking, including vitamins, herbs, eye drops, creams, and over-the-counter medicines.  Any problems you or family members have had with anesthetic medicines.  Any blood disorders you have.  Any surgeries you have had.  Any medical conditions you have.  Whether you are pregnant or may be pregnant. What are the risks? Generally, this is a safe procedure. However, problems may occur, including:  Infection.  Bleeding.  Allergic reactions to medicines.  Stiffening of the hip and knee joint (hip and knee contracture).  Blood clot in a deep vein (deep vein thrombosis, or DVT), usually in the leg.  A blood clot in your lung (pulmonary embolism). What happens before the procedure? Staying hydrated Follow instructions from your health care provider about hydration, which may include:  Up to 2 hours before the procedure - you may continue to drink clear liquids, such as water, clear fruit juice, black coffee, and plain tea. Eating and drinking restrictions Follow instructions from your health care provider about eating and drinking, which may include:  8 hours before the procedure - stop eating heavy meals or foods such as meat, fried foods, or fatty foods.  6 hours before the procedure - stop eating light meals or foods, such  as toast or cereal.  6 hours before the procedure - stop drinking milk or drinks that contain milk.  2 hours before the procedure - stop drinking clear liquids. Medicines  Ask your health care provider about: ? Changing or stopping your regular medicines. This is especially important if you are taking diabetes medicines or blood thinners. ? Taking over-the-counter medicines, vitamins, herbs, and supplements. ? Taking medicines such as aspirin and ibuprofen. These medicines can thin your blood. Do not take these medicines unless your health care provider tells you to take them.  You may be given antibiotic medicine to help prevent an infection. If so, take the antibiotic as told by your health care provider. General instructions  Ask your health care provider how your surgical site will be marked or identified.  Plan to have someone take you home from the hospital or clinic.  Plan to have a responsible adult care for you for at least 24 hours after you leave the hospital or clinic. This is important. What happens during the procedure?  To lower your risk of infection: ? Your health care team will wash or sanitize their hands. ? Hair may be removed from the surgical area. ? Your skin will be washed with soap.  An IV will be inserted into one of your veins.  You will be given one or more of the following: ? A medicine to help you relax (sedative). ? A medicine to numb the area (local anesthetic). ? A medicine to make you fall asleep (general anesthetic). ? A medicine that is injected into your spine to  numb the area below and slightly above the injection site (spinal anesthetic). ? A medicine that is injected into an area of your body to numb everything below the injection site (regional anesthetic).  Damaged or diseased tissue and bone will be removed.  Uneven areas of bone will be smoothed.  Blood vessels and nerves will be closed off.  Muscles will be cut and reshaped so  that an artificial leg (prosthesis) can be attached to the stump.  The incision will be closed with stitches (sutures).  A bandage (dressing) will be placed over the incision. The procedure may vary among health care providers and hospitals. What happens after the procedure?  Your blood pressure, heart rate, breathing rate, and blood oxygen level will be monitored until the medicines you were given have worn off.  You will be given medicine for pain. The medicine may include: ? A sedative. ? A spinal anesthetic. ? A regional anesthetic.  You may start physical therapy within 24 hours after your surgery. Summary  Leg amputation is a procedure to remove the leg from above, below, or through the knee.  Follow instructions from your health care provider about eating or drinking restrictions before the procedure.  Before the procedure, ask your health care provider how your surgical site will be marked or identified. This information is not intended to replace advice given to you by your health care provider. Make sure you discuss any questions you have with your health care provider. Document Revised: 07/11/2019 Document Reviewed: 07/11/2019 Elsevier Patient Education  2021 [employer].

## 2020-08-13 MED ORDER — PREGABALIN 100 MG PO CAPS
100.0000 mg | ORAL_CAPSULE | Freq: Three times a day (TID) | ORAL | Status: DC
  Administered 2020-08-13 (×3): 100 mg via ORAL
  Filled 2020-08-13 (×3): qty 1

## 2020-08-13 MED ORDER — OXYCODONE HCL ER 10 MG PO T12A
10.0000 mg | EXTENDED_RELEASE_TABLET | Freq: Two times a day (BID) | ORAL | Status: DC
Start: 2020-08-13 — End: 2020-08-14
  Administered 2020-08-13 (×2): 10 mg via ORAL
  Filled 2020-08-13 (×2): qty 1

## 2020-08-13 MED ORDER — OXYCODONE-ACETAMINOPHEN 5-325 MG PO TABS
1.0000 | ORAL_TABLET | ORAL | Status: DC | PRN
  Administered 2020-08-13 – 2020-08-14 (×3): 2 via ORAL
  Filled 2020-08-13 (×3): qty 2

## 2020-08-13 NOTE — Progress Notes (Signed)
Patient was not offered a bed from [facility] in [place].  Patient is under review by corporate at [facility] for possible admission as an LOG.   Edwin Dada, MSW, LCSW Transitions of Care  Clinical Social Worker II [phone]

## 2020-08-13 NOTE — Progress Notes (Signed)
Occupational Therapy Treatment Patient Details Name: Jake Nguyen MRN: [id number] DOB: June 13, 1965 Today's Date: 08/13/2020    History of present illness 55 y.o. male presents to [facility] ED on 07/31/2020 with worsening L foot pain. Pt with history of L foot gangrene for ~1 month, there were plans for vascular surgery at [facility] however pt left AMA. He had LLE angiogram 4/26.  Pt S/P left BKA 4/27. PMH includes medical history significant of alcohol abuse, left foot gangrene.   OT comments  Pt with significant pain in residual limb, but participated in toileting on BSC with min guard assist for transfer and seated grooming at EOB with set up. Reinforced compensatory strategies for LB ADL and pericare seated, leaning side to side.   Follow Up Recommendations  SNF;Supervision/Assistance - 24 hour;Supervision - Intermittent    Equipment Recommendations  3 in 1 bedside commode    Recommendations for Other Services      Precautions / Restrictions Precautions Precautions: Fall Precaution Comments: watch HR, L BKA Restrictions LLE Weight Bearing: Non weight bearing       Mobility Bed Mobility Overal bed mobility: Modified Independent             General bed mobility comments: HOB up    Transfers Overall transfer level: Needs assistance Equipment used: Rolling walker (2 wheeled) Transfers: Sit to/from Stand Sit to Stand: Min guard         General transfer comment: from bed and BSC    Balance Overall balance assessment: Needs assistance   Sitting balance-Leahy Scale: Good     Standing balance support: Bilateral upper extremity supported Standing balance-Leahy Scale: Poor                             ADL either performed or assessed with clinical judgement   ADL Overall ADL's : Needs assistance/impaired     Grooming: Wash/dry hands;Wash/dry face;Oral care;Sitting;Supervision/safety Grooming Details (indicate cue type and reason): sitting EOB                  Toilet Transfer: Min guard;Stand-pivot;BSC;RW   Toileting- Architect and Hygiene: Min guard;Set up;Sitting/lateral lean;Sit to/from stand               Vision       Perception     Praxis      Cognition Arousal/Alertness: Awake/alert Behavior During Therapy: WFL for tasks assessed/performed Overall Cognitive Status: Impaired/Different from baseline Area of Impairment: Safety/judgement;Problem solving                         Safety/Judgement: Decreased awareness of safety   Problem Solving: Requires verbal cues General Comments: highly focused on pain        Exercises     Shoulder Instructions       General Comments      Pertinent Vitals/ Pain       Pain Assessment: 0-10 Faces Pain Scale: Hurts whole lot Pain Location: L residual limb Pain Descriptors / Indicators: Discomfort;Grimacing;Throbbing Pain Intervention(s): Monitored during session;Repositioned;Patient requesting pain meds-RN notified;Premedicated before session  Home Living                                          Prior Functioning/Environment              Frequency  Min 2X/week        Progress Toward Goals  OT Goals(current goals can now be found in the care plan section)  Progress towards OT goals: Progressing toward goals  Acute Rehab OT Goals Patient Stated Goal: to have less pain and get out of the hospital soon OT Goal Formulation: With patient Time For Goal Achievement: 08/21/20 Potential to Achieve Goals: Good  Plan Discharge plan remains appropriate;Frequency remains appropriate    Co-evaluation                 AM-PAC OT "6 Clicks" Daily Activity     Outcome Measure   Help from another person eating meals?: None Help from another person taking care of personal grooming?: A Little Help from another person toileting, which includes using toliet, bedpan, or urinal?: A Little Help from another person bathing  (including washing, rinsing, drying)?: A Little Help from another person to put on and taking off regular upper body clothing?: A Little Help from another person to put on and taking off regular lower body clothing?: A Little 6 Click Score: 19    End of Session Equipment Utilized During Treatment: Gait belt;Rolling walker  OT Visit Diagnosis: Pain;Muscle weakness (generalized) (M62.81);Other abnormalities of gait and mobility (R26.89) Pain - Right/Left: Left Pain - part of body: Leg   Activity Tolerance Patient tolerated treatment well   Patient Left in bed;with call bell/phone within reach;with bed alarm set   Nurse Communication Patient requests pain meds        Time: 1025-8527 OT Time Calculation (min): 22 min  Charges: OT General Charges $OT Visit: 1 Visit OT Treatments $Self Care/Home Management : 8-22 mins  Martie Round, OTR/L Acute Rehabilitation Services Pager: [phone] Office: [phone]   Evern Bio 08/13/2020, 3:22 PM

## 2020-08-13 NOTE — Progress Notes (Signed)
Mobility Specialist - Progress Note   08/13/20 1150  Mobility  Activity Ambulated in hall  Level of Assistance Contact guard assist, steadying assist  Assistive Device Front wheel walker  Distance Ambulated (ft) 70 ft  Mobility Response Tolerated well  Mobility performed by Mobility specialist  $Mobility charge 1 Mobility   Pre-mobility: 126 HR During mobility: 145 HR Post-mobility: 118 HR  Pt c/o pain in stump w/ ambulation, otherwise asx. Distance limited so that pt can work w/ PT as well today. Pt back in bed after ambulation, call bell at side.   Mamie Levers Mobility Specialist Mobility Specialist Phone: [phone]

## 2020-08-13 NOTE — Progress Notes (Signed)
Physical Therapy Treatment Patient Details Name: Jake Nguyen MRN: [id number] DOB: [birth year] Today's Date: 08/13/2020    History of Present Illness 55 y.o. male presents to [facility] ED on 07/31/2020 with worsening L foot pain. Pt with history of L foot gangrene for ~1 month, there were plans for vascular surgery at [facility] however pt left AMA. He had LLE angiogram 4/26.  Pt S/P left BKA 4/27. PMH includes medical history significant of alcohol abuse, left foot gangrene.    PT Comments    Pt received in supine, agreeable to therapy session with encouragement and with good participation and tolerance for stair training with 4" curb step. Pt at times impulsive due to frustration with timing of therapy session and HR elevated to 140's bpm with exertion, therefore deferred longer gait trial due to pt c/o pain/fatigue and tachycardia. Pt continues to benefit from PT services to progress toward functional mobility goals. Continue to recommend SNF, pt reports no one available to assist him for mobility/ADLs and reports his family all live in [place].   Follow Up Recommendations  SNF;Supervision for mobility/OOB     Equipment Recommendations  Rolling walker with 5" wheels    Recommendations for Other Services       Precautions / Restrictions Precautions Precautions: Fall Precaution Comments: watch HR, L BKA Restrictions Weight Bearing Restrictions: Yes LLE Weight Bearing: Non weight bearing    Mobility  Bed Mobility Overal bed mobility: Modified Independent Bed Mobility: Supine to Sit;Sit to Supine     Supine to sit: Modified independent (Device/Increase time) Sit to supine: Modified independent (Device/Increase time)   General bed mobility comments: HOB up    Transfers Overall transfer level: Needs assistance Equipment used: Rolling walker (2 wheeled) Transfers: Sit to/from Stand Sit to Stand: Min guard         General transfer comment: from  bed<>RW  Ambulation/Gait Ambulation/Gait assistance: Min guard Gait Distance (Feet): 10 Feet Assistive device: Rolling walker (2 wheeled)       General Gait Details: HR elevated to 140's bpm after steps, no dizziness, fair use of RW, cues for activity pacing/standing breaks when arms fatigue   Stairs Stairs: Yes Stairs assistance: Min assist;Min guard Stair Management: Step to pattern;Backwards;With walker;No rails Number of Stairs: 20 General stair comments: pt performed 4" platform step via backward hop-up using RW x20 reps, initially encouraged him to perform x5 reps if tolerated but pt frustrated and ignoring therapist cues to stop after 5 reps. Pt HR max 145 bpm and eventually agreed to stop after 20 reps. He c/o increased pain once back in bed.   Wheelchair Mobility    Modified Rankin (Stroke Patients Only)       Balance Overall balance assessment: Needs assistance Sitting-balance support: No upper extremity supported;Feet supported Sitting balance-Leahy Scale: Good     Standing balance support: Bilateral upper extremity supported Standing balance-Leahy Scale: Poor Standing balance comment: reliant on the RW                            Cognition Arousal/Alertness: Awake/alert Behavior During Therapy: WFL for tasks assessed/performed Overall Cognitive Status: Impaired/Different from baseline Area of Impairment: Safety/judgement;Problem solving                         Safety/Judgement: Decreased awareness of safety   Problem Solving: Requires verbal cues General Comments: highly focused on pain, initally agreeable but some emotional lability (staff had  to leave to get platform step for session then food arrived prior to staff getting back with equipment and pt frustrated by having to finish session prior to eating).      Exercises      General Comments General comments (skin integrity, edema, etc.): HR 120's bpm after resting ~3 mins, HR  max 145 bpm with exertion; cued him for activity pacing but pt at times ignoring therapist cues and self-directed behaviors.      Pertinent Vitals/Pain Pain Assessment: Faces Faces Pain Scale: Hurts whole lot Pain Location: L residual limb, especially after exertion Pain Descriptors / Indicators: Discomfort;Grimacing;Throbbing Pain Intervention(s): Monitored during session;Limited activity within patient's tolerance;Repositioned;Patient requesting pain meds-RN notified    Home Living                      Prior Function            PT Goals (current goals can now be found in the care plan section) Acute Rehab PT Goals Patient Stated Goal: to have less pain and get out of the hospital soon PT Goal Formulation: With patient Time For Goal Achievement: 08/22/20 Potential to Achieve Goals: Fair Progress towards PT goals: Progressing toward goals    Frequency    Min 3X/week      PT Plan Current plan remains appropriate    Co-evaluation              AM-PAC PT "6 Clicks" Mobility   Outcome Measure  Help needed turning from your back to your side while in a flat bed without using bedrails?: None Help needed moving from lying on your back to sitting on the side of a flat bed without using bedrails?: None Help needed moving to and from a bed to a chair (including a wheelchair)?: A Little Help needed standing up from a chair using your arms (e.g., wheelchair or bedside chair)?: A Little Help needed to walk in hospital room?: A Little Help needed climbing 3-5 steps with a railing? : A Lot (for regular 7" height step) 6 Click Score: 19    End of Session Equipment Utilized During Treatment: Gait belt Activity Tolerance: Patient limited by pain;Patient tolerated treatment well Patient left: in bed;with call bell/phone within reach;with bed alarm set (pt instructed on NO pillow under LLE) Nurse Communication: Mobility status;Other (comment) PT Visit Diagnosis: Other  abnormalities of gait and mobility (R26.89);Pain;Unsteadiness on feet (R26.81);Difficulty in walking, not elsewhere classified (R26.2) Pain - Right/Left: Left Pain - part of body: Leg     Time: 1610-9604 PT Time Calculation (min) (ACUTE ONLY): 16 min  Charges:  $Gait Training: 8-22 mins                     Suad Autrey P., PTA Acute Rehabilitation Services Pager: [phone] Office: [phone]   Angus Palms 08/13/2020, 5:34 PM

## 2020-08-13 NOTE — Progress Notes (Signed)
[facility] PROGRESS NOTE  Jake Nguyen WUX:[id number] DOB: [birth year] DOA: 07/31/2020 PCP: Patient, No Pcp Per (Inactive)  Status: Remains inpatient appropriate because:Ongoing active pain requiring inpatient pain management and Unsafe d/c plan   Dispo:  Patient From: Home  Planned Disposition: Skilled Nursing Facility  Medically stable for discharge: Yes  Difficult to place patient: Yes   Level of care: Telemetry Medical  Code Status: Full Family Communication: Patient only DVT prophylaxis: Lovenox Vaccination status: Unvaccinated but as of 5/2 has agreed to take the first shot of Moderna    HPI:  55 year old male with past medical history significant for EtOH abuse, left foot gangrene who presented to [facility], ED with worsening left foot pain.  Patient developed left foot gangrene about 1 month ago, evaluated at [facility] with plan for vascular surgery evaluation; however patient left AMA.  He reports being wheelchair-bound for past 2 months because of an bearable left foot pain.  In the ED, his left foot was noted to be with gangrenous changes, was tachycardic without fever.  Lactic acid within normal limits.  WBC within normal limits.  Cova-19 PCR/influenza A/B PCR negative.  Left foot x-ray with progression of soft tissue gas compatible with infection, negative for osteomyelitis.  Vascular surgery consulted.  Hospital service consulted for further evaluation and management.  Since admission patient underwent aortogram that revealed patent superficial femoral artery but unfortunately was occluded with reconstitution of the peroneal artery where it collateralized with the posterior tibial artery.  Patient subsequently underwent BKA.  PT and OT recommending short-term SNF for rehab.  Patient has had significant issues with ongoing postoperative pain manifesting as phantom limb pain as well as shooting neuropathic pain.  In addition during the initial portion  of the hospitalization patient was placed under IVC due to reported suicidal ideation.  Evaluated by psychiatry and was deemed not an imminent risk to self or others therefore IVC rescinded.  Subjective: Alert.  Complaining of significant pain and stop which is limiting his ability to participate with therapies and get out of bed to the chair otherwise.  Objective: Vitals:   08/13/20 0322 08/13/20 0815  BP: 101/67 (!) 92/55  Pulse: 94   Resp: 18   Temp: 98.7 F (37.1 C) 98.4 F (36.9 C)  SpO2: 93%     Intake/Output Summary (Last 24 hours) at 08/13/2020 0827 Last data filed at 08/13/2020 0816 Gross per 24 hour  Intake 480 ml  Output 1050 ml  Net -570 ml   Filed Weights   07/31/20 1300 07/31/20 2135  Weight: 68.9 kg 67.3 kg    Exam:  Constitutional: Awake, calm, at rest no significant distress although does have low-grade continuous pain and left stump Respiratory:'s are clear.  Respiratory effort normal.  Room air Cardiovascular: Normal heart sounds, no peripheral edema, pulse regular. Abdomen: LBM 5/4, soft and nontender.  Adequate oral intake.  Normoactive bowel sounds. Musculoskeletal: Left BKA.  Surgical site dry and intact Neurologic: Cranial nerves II through XII grossly intact.  Psychiatric: Alert and oriented x3.  Pleasant affect.   Assessment/Plan: Acute problems: Left foot gangrene, severe-s/p left BKA 4/27 -- Imaging this admission consistent with soft tissue gangrene and additional imaging also consistent with moderate to severe PAD --OP FU w/ vascular surgery in 4 weeks for staple removal  Peripheral artery disease --Confirmed on imaging this admission  -- Continue atorvastatin and aspirin  Neuropathy/phantom limb pain -- Continue Lyrica to 100 mg 3 times daily -- 5/5: Add OxyContin  10 mg twice daily -- Continue low-dose Percocet for breakthrough pain -- DC IV morphine  Mood disorder /anxiety/insomnia -- Continue Seroquel-may need to increase dose if  treatment of pain not helpful in decreasing insomnia  Chronic blood loss anemia in the setting of left foot gangrene Baseline hemoglobin around 12 --Hgb 10.5>>8.5>8.1>7.8>7.2 > 7.5 --Iron 13 with a saturation of 4-given IV iron followed by oral iron  --Transfuse for hemoglobin less than 7.0 or active bleeding --Repeat labs a.m. 5/6  Polysubstance abuse: EtOH and tobacco  -- Continue multivitamin, thiamine, folic acid and nicotine patch -- Recommend outpatient substance abuse treatment  Physical deconditioning/ambulatory dysfunction secondary to new BKA --PT and OT recommend SNF for rehabilitative therapies -- Was deconditioned prior to admission noting was wheelchair-bound for 2 months prior to admission    Other problems: Hypovolemic hyponatremia: Resolved Patient presented with a sodium 132, improved with IV fluid hydration.  Hypomagnesemia Resolved --Follow lab prn   Hypokalemia: Resolved Resolved --Follow lab prn    Data Reviewed: Basic Metabolic Panel: Recent Labs  Lab 08/07/20 0124 08/07/20 0750 08/08/20 0145 08/10/20 0205  NA 134*  --  135 137  K 3.2*  --  3.5 4.1  CL 101  --  101 101  CO2 26  --  26 28  GLUCOSE 133*  --  117* 109*  BUN 6  --  6 <5*  CREATININE 0.59*  --  0.61 0.62  CALCIUM 7.8*  --  7.8* 8.5*  MG  --  1.7 1.9 1.9   CBC: Recent Labs  Lab 08/07/20 0124 08/08/20 0145 08/10/20 0205  WBC 9.8 9.1 7.1  HGB 7.8* 7.2* 7.5*  HCT 24.3* 22.2* 24.0*  MCV 84.7 84.1 84.5  PLT 264 281 395     Studies: No results found.  Scheduled Meds: . aspirin EC  325 mg Oral Daily  . atorvastatin  40 mg Oral Daily  . enoxaparin (LOVENOX) injection  40 mg Subcutaneous Q24H  . ferrous sulfate  325 mg Oral BID WC  . folic acid  1 mg Oral Daily  . multivitamin with minerals  1 tablet Oral Daily  . nicotine  21 mg Transdermal Daily  . pregabalin  75 mg Oral TID  . senna-docusate  2 tablet Oral BID  . sodium chloride flush  3 mL Intravenous Q12H   . thiamine  100 mg Oral Daily   Or  . thiamine  100 mg Intravenous Daily   Continuous Infusions: . sodium chloride 250 mL (08/05/20 2120)    Active Problems:   Alcohol abuse with alcohol-induced mood disorder (HCC)   Gangrene of foot (HCC)   Gangrene (HCC)   S/P BKA (below knee amputation) unilateral, left (HCC)   Neuropathic pain, leg, left   Consultants:  Psychiatry  Vascular surgery  Procedures:  Adominal aortogram, 08/04/2020, Dr. Myra Gianotti  Left BKA, 08/05/2020, Dr. Myra Gianotti   Antibiotics:  Vancomycin 4/22>>  Cefepime 4/24 - 4/28  Zosyn 4/22 - 4/28    Time spent: 20 minutes    Junious Silk ANP  [facility] 7 am - 330 pm/M-F for direct patient care and secure chat Please refer to Amion for contact info 13  days

## 2020-08-14 LAB — CBC
HCT: 24.6 % — ABNORMAL LOW (ref 39.0–52.0)
Hemoglobin: 7.7 g/dL — ABNORMAL LOW (ref 13.0–17.0)
MCH: 26.1 pg (ref 26.0–34.0)
MCHC: 31.3 g/dL (ref 30.0–36.0)
MCV: 83.4 fL (ref 80.0–100.0)
Platelets: 602 10*3/uL — ABNORMAL HIGH (ref 150–400)
RBC: 2.95 MIL/uL — ABNORMAL LOW (ref 4.22–5.81)
RDW: 15.7 % — ABNORMAL HIGH (ref 11.5–15.5)
WBC: 9.5 10*3/uL (ref 4.0–10.5)
nRBC: 0 % (ref 0.0–0.2)

## 2020-08-14 LAB — BASIC METABOLIC PANEL
Anion gap: 5 (ref 5–15)
BUN: 7 mg/dL (ref 6–20)
CO2: 30 mmol/L (ref 22–32)
Calcium: 8.6 mg/dL — ABNORMAL LOW (ref 8.9–10.3)
Chloride: 98 mmol/L (ref 98–111)
Creatinine, Ser: 0.6 mg/dL — ABNORMAL LOW (ref 0.61–1.24)
GFR, Estimated: >60 mL/min (ref >60)
Glucose, Bld: 101 mg/dL — ABNORMAL HIGH (ref 70–99)
Potassium: 3.8 mmol/L (ref 3.5–5.1)
Sodium: 133 mmol/L — ABNORMAL LOW (ref 135–145)

## 2020-08-14 MED ORDER — OXYCODONE HCL ER 10 MG PO T12A: 20.0000 mg | EXTENDED_RELEASE_TABLET | Freq: Two times a day (BID) | ORAL | Status: DC

## 2020-08-14 MED ORDER — OXYCODONE HCL ER 10 MG PO T12A
20.0000 mg | EXTENDED_RELEASE_TABLET | Freq: Two times a day (BID) | ORAL | Status: DC
  Administered 2020-08-14 – 2020-08-20 (×13): 20 mg via ORAL
  Filled 2020-08-14 (×14): qty 2

## 2020-08-14 MED ORDER — OXYCODONE HCL 5 MG PO TABS
10.0000 mg | ORAL_TABLET | ORAL | Status: DC | PRN
  Administered 2020-08-14 – 2020-08-25 (×25): 10 mg via ORAL
  Filled 2020-08-14 (×25): qty 2

## 2020-08-14 MED ORDER — OXYCODONE HCL 5 MG PO TABS
15.0000 mg | ORAL_TABLET | Freq: Once | ORAL | Status: AC
  Administered 2020-08-14: 15 mg via ORAL
  Filled 2020-08-14: qty 3

## 2020-08-14 MED ORDER — BACLOFEN 5 MG HALF TABLET
5.0000 mg | ORAL_TABLET | Freq: Three times a day (TID) | ORAL | Status: DC
  Administered 2020-08-14 – 2020-08-27 (×38): 5 mg via ORAL
  Filled 2020-08-14 (×43): qty 1

## 2020-08-14 MED ORDER — PREGABALIN 75 MG PO CAPS
150.0000 mg | ORAL_CAPSULE | Freq: Three times a day (TID) | ORAL | Status: DC
  Administered 2020-08-14 – 2020-08-27 (×40): 150 mg via ORAL
  Filled 2020-08-14 (×37): qty 2
  Filled 2020-08-14: qty 6
  Filled 2020-08-14 (×2): qty 2

## 2020-08-14 NOTE — Progress Notes (Addendum)
CSW was notified by Antionette at [facility] that the facility will not accept any LOG's at this time.  CSW spoke with Rasheema at [facility] who states the facility will not accept any LOG's at this time.   CSW spoke with Malena Peer at [facility] who states that the facility will not accept this patient due to him recently leaving AMA from the hospital.  Edwin Dada, MSW, LCSW Transitions of Care  Clinical Social Worker II [phone]

## 2020-08-14 NOTE — Progress Notes (Signed)
Mobility Specialist: Progress Note   08/14/20 1521  Mobility  Activity Ambulated in hall  Level of Assistance Contact guard assist, steadying assist  Assistive Device Front wheel walker  Distance Ambulated (ft) 190 ft  Mobility Response Tolerated well  Mobility performed by Mobility specialist  $Mobility charge 1 Mobility   Pt c/o pain pre-mobility and received pain medication before mobility by RN. Pt had no c/o during ambulation. Pt back to bed after walk with family present in room.   [facility] Bentlie Withem Mobility Specialist Mobility Specialist Phone: [phone]

## 2020-08-14 NOTE — Progress Notes (Signed)
[facility] PROGRESS NOTE  Jake Nguyen KAJ:[id number] DOB: 1965-08-03 DOA: 07/31/2020 PCP: Patient, No Pcp Per (Inactive)  Status: Remains inpatient appropriate because:Ongoing active pain requiring inpatient pain management and Unsafe d/c plan   Dispo:  Patient From: Home  Planned Disposition: Skilled Nursing Facility  Medically stable for discharge: Yes  Difficult to place patient: Yes   Level of care: Telemetry Medical  Code Status: Full Family Communication: Patient, significant other on 5/5 DVT prophylaxis: Lovenox Vaccination status: Unvaccinated but as of 5/2 has agreed to take the first shot of Moderna    HPI:  55 year old male with past medical history significant for EtOH abuse, left foot gangrene who presented to [facility], ED with worsening left foot pain.  Patient developed left foot gangrene about 1 month ago, evaluated at [facility] with plan for vascular surgery evaluation; however patient left AMA.  He reports being wheelchair-bound for past 2 months because of an bearable left foot pain.  In the ED, his left foot was noted to be with gangrenous changes, was tachycardic without fever.  Lactic acid within normal limits.  WBC within normal limits.  Cova-19 PCR/influenza A/B PCR negative.  Left foot x-ray with progression of soft tissue gas compatible with infection, negative for osteomyelitis.  Vascular surgery consulted.  Hospital service consulted for further evaluation and management.  Since admission patient underwent aortogram that revealed patent superficial femoral artery but unfortunately was occluded with reconstitution of the peroneal artery where it collateralized with the posterior tibial artery.  Patient subsequently underwent BKA.  PT and OT recommending short-term SNF for rehab.  Patient has had significant issues with ongoing postoperative pain manifesting as phantom limb pain as well as shooting neuropathic pain.  In addition during  the initial portion of the hospitalization patient was placed under IVC due to reported suicidal ideation.  Evaluated by psychiatry and was deemed not an imminent risk to self or others therefore IVC rescinded.  Subjective: Awake.  No significant improvement in pain with the addition of low-dose OxyContin.  Requesting something for pain now.  Objective: Vitals:   08/14/20 0212 08/14/20 0411  BP: 102/64 100/73  Pulse: 100 80  Resp: 17 16  Temp: 99.2 F (37.3 C) 98.6 F (37 C)  SpO2: 96% 98%    Intake/Output Summary (Last 24 hours) at 08/14/2020 0751 Last data filed at 08/14/2020 0030 Gross per 24 hour  Intake 960 ml  Output 2060 ml  Net -1100 ml   Filed Weights   07/31/20 1300 07/31/20 2135  Weight: 68.9 kg 67.3 kg    Exam:  Constitutional: Awake, uncomfortable 2/2 ongoing neuropathic stomach pain Respiratory: Lungs are clear, normal respiratory effort, room air Cardiovascular: S1-S2, no JVD, no peripheral edema, regular pulse Abdomen: LBM 5/5, nontender normal active bowel sounds Musculoskeletal: Left BKA tender to minimal palpation.  Surgical site dry and intact Neurologic: Cranial nerves II through XII grossly intact, no focal neurological deficits, gait and ambulation limited by recent BKA Psychiatric: Alert and oriented x3, pleasant affect.   Assessment/Plan: Acute problems: Left foot gangrene, severe-s/p left BKA 4/27 -- Imaging this admission consistent with soft tissue gangrene and additional imaging also consistent with moderate to severe PAD --OP FU w/ vascular surgery in 4 weeks for staple removal  Peripheral artery disease --Confirmed on imaging this admission  -- Continue atorvastatin and aspirin  Neuropathy/phantom limb pain -- Increase Lyrica to 150 mg TID -- Increase OxyContin to 20 mg BID -- DC Percocet in favor of Oxy  IR 10 mg every 4 hours prn for breakthrough pain-give 15 mg x 1 now -- DC Robaxin and begin scheduled baclofen 5 mg TID  Mood  disorder /anxiety/insomnia -- Continue Seroquel-may need to increase dose if treatment of pain not helpful in decreasing insomnia  Chronic blood loss anemia in the setting of left foot gangrene Baseline hemoglobin around 12 --Hgb 10.5 >8.5 >8.1 >7.8 >7.2 > 7.5 > 7.7 --Iron 13 with a saturation of 4-given IV iron followed by oral iron  --Transfuse for hemoglobin less than 7.0 or active bleeding --Repeat labs a.m. 5/6  Polysubstance abuse: EtOH and tobacco  -- Continue multivitamin, thiamine, folic acid and nicotine patch -- Recommend outpatient substance abuse treatment  Physical deconditioning/ambulatory dysfunction secondary to new BKA --PT and OT recommend SNF for rehabilitative therapies -- Was deconditioned prior to admission noting was wheelchair-bound for 2 months prior to admission    Other problems: Hypovolemic hyponatremia:  Resolved  Hypomagnesemia Resolved  Hypokalemia:  Resolved     Data Reviewed: Basic Metabolic Panel: Recent Labs  Lab 08/08/20 0145 08/10/20 0205 08/14/20 0150  NA 135 137 133*  K 3.5 4.1 3.8  CL 101 101 98  CO2 26 28 30   GLUCOSE 117* 109* 101*  BUN 6 <5* 7  CREATININE 0.61 0.62 0.60*  CALCIUM 7.8* 8.5* 8.6*  MG 1.9 1.9  --    CBC: Recent Labs  Lab 08/08/20 0145 08/10/20 0205 08/14/20 0150  WBC 9.1 7.1 9.5  HGB 7.2* 7.5* 7.7*  HCT 22.2* 24.0* 24.6*  MCV 84.1 84.5 83.4  PLT 281 395 602*     Studies: No results found.  Scheduled Meds: . aspirin EC  325 mg Oral Daily  . atorvastatin  40 mg Oral Daily  . enoxaparin (LOVENOX) injection  40 mg Subcutaneous Q24H  . ferrous sulfate  325 mg Oral BID WC  . folic acid  1 mg Oral Daily  . multivitamin with minerals  1 tablet Oral Daily  . nicotine  21 mg Transdermal Daily  . oxyCODONE  10 mg Oral Q12H  . pregabalin  100 mg Oral TID  . senna-docusate  2 tablet Oral BID  . sodium chloride flush  3 mL Intravenous Q12H  . thiamine  100 mg Oral Daily   Or  . thiamine  100  mg Intravenous Daily   Continuous Infusions: . sodium chloride 250 mL (08/05/20 2120)    Active Problems:   Alcohol abuse with alcohol-induced mood disorder (HCC)   Gangrene of foot (HCC)   Gangrene (HCC)   S/P BKA (below knee amputation) unilateral, left (HCC)   Neuropathic pain, leg, left   Consultants:  Psychiatry  Vascular surgery  Procedures:  Adominal aortogram, 08/04/2020, Dr. 08/06/2020  Left BKA, 08/05/2020, Dr. 08/07/2020   Antibiotics:  Vancomycin 4/22>>  Cefepime 4/24 - 4/28  Zosyn 4/22 - 4/28    Time spent: 20 minutes    5/28 ANP  [facility] 7 am - 330 pm/M-F for direct patient care and secure chat Please refer to Amion for contact info 14  days

## 2020-08-14 NOTE — Plan of Care (Signed)
  Problem: Education: Goal: Knowledge of General Education information will improve Description: Including pain rating scale, medication(s)/side effects and non-pharmacologic comfort measures Outcome: Progressing   Problem: Activity: Goal: Risk for activity intolerance will decrease Outcome: Progressing   Problem: Coping: Goal: Level of anxiety will decrease Outcome: Progressing   

## 2020-08-15 NOTE — Progress Notes (Signed)
Mobility Specialist: Progress Note   08/15/20 1513  Mobility  Activity Ambulated in hall  Level of Assistance Contact guard assist, steadying assist  Assistive Device Front wheel walker  Distance Ambulated (ft) 230 ft  Mobility Response Tolerated well  Mobility performed by Mobility specialist  $Mobility charge 1 Mobility   Pt c/o pain in L stump during ambulation, no rating given. Pt otherwise asx and back to bed after walk.   [facility] Ronon Ferger Mobility Specialist Mobility Specialist Phone: [phone]

## 2020-08-15 NOTE — Progress Notes (Signed)
Patient on the LLS team.  No overnight events.  Patient resting in bed this AM after eating breakfast.  Continue to work on placement [employer] DO

## 2020-08-16 NOTE — Progress Notes (Signed)
Mobility Specialist: Progress Note   08/16/20 1631  Mobility  Activity Ambulated in hall  Level of Assistance Contact guard assist, steadying assist  Assistive Device Front wheel walker  Distance Ambulated (ft) 210 ft  Mobility Response Tolerated well  Mobility performed by Mobility specialist  $Mobility charge 1 Mobility   Pt took several brief standing breaks today due to hands cramping. Pt c/o 6-7/10 pain after walk and wondering when he can have pain medication again, RN notified.   [facility] Maryum Batterson Mobility Specialist Mobility Specialist Phone: [phone]

## 2020-08-16 NOTE — Progress Notes (Signed)
Patient on the LLS team.  No overnight events.  Pain better controlled than last weekend.  Continue to work on placement.  Patient resting comfortably in bed.  Worked with mobility specialist yesterday. Marlin Canary DO

## 2020-08-17 NOTE — Progress Notes (Addendum)
[facility] PROGRESS NOTE  CLEO VILLAMIZAR WUX:[id number] DOB: [birth year] DOA: 07/31/2020 PCP: Patient, No Pcp Per (Inactive)  Status: Remains inpatient appropriate because:Ongoing active pain requiring inpatient pain management and Unsafe d/c plan   Dispo:  Patient From: Home  Planned Disposition: Shelter  Medically stable for discharge: Yes  Difficult to place patient: Yes   Level of care: Med-Surg  Code Status: Full Family Communication: Patient, significant other on 5/5 DVT prophylaxis: Lovenox Vaccination status: Unvaccinated but as of 5/2 has agreed to take the first shot of Moderna    HPI:  55 year old male with past medical history significant for EtOH abuse, left foot gangrene who presented to [facility], ED with worsening left foot pain.  Patient developed left foot gangrene about 1 month ago, evaluated at [facility] with plan for vascular surgery evaluation; however patient left AMA.  He reports being wheelchair-bound for past 2 months because of an bearable left foot pain.  In the ED, his left foot was noted to be with gangrenous changes, was tachycardic without fever.  Lactic acid within normal limits.  WBC within normal limits.  Cova-19 PCR/influenza A/B PCR negative.  Left foot x-ray with progression of soft tissue gas compatible with infection, negative for osteomyelitis.  Vascular surgery consulted.  Hospital service consulted for further evaluation and management.  Since admission patient underwent aortogram that revealed patent superficial femoral artery but unfortunately was occluded with reconstitution of the peroneal artery where it collateralized with the posterior tibial artery.  Patient subsequently underwent BKA.  PT and OT recommending short-term SNF for rehab.  Patient has had significant issues with ongoing postoperative pain manifesting as phantom limb pain as well as shooting neuropathic pain.  In addition during the initial portion of  the hospitalization patient was placed under IVC due to reported suicidal ideation.  Evaluated by psychiatry and was deemed not an imminent risk to self or others therefore IVC rescinded.  Subjective: Awakened from sleep.  Denies pain.  Objective: Vitals:   08/16/20 2341 08/17/20 0400  BP: 114/78 109/73  Pulse: (!) 107 (!) 110  Resp: 16 18  Temp: 98.7 F (37.1 C) 98.8 F (37.1 C)  SpO2: 96% 98%    Intake/Output Summary (Last 24 hours) at 08/17/2020 0800 Last data filed at 08/17/2020 0400 Gross per 24 hour  Intake 720 ml  Output 2430 ml  Net -1710 ml   Filed Weights   07/31/20 1300 07/31/20 2135  Weight: 68.9 kg 67.3 kg    Exam:  Constitutional: Awakens easily, calm, no acute distress Respiratory: Lungs are clear, room air Cardiovascular: S1-S2, pulse regular and nontachycardic except with activity, no peripheral edema Abdomen: LBM 5/5, soft and nontender.  Bowel sounds present Musculoskeletal: Left BKA tender to minimal palpation.  Surgical site dry and intact Neurologic: Cranial nerves II through XII are intact.  Weak on left side in context of recent BKA.  No focal neurological deficits Psychiatric: Alert and oriented x3.  Pleasant affect   Assessment/Plan: Acute problems: Left foot gangrene, severe-s/p left BKA 4/27 -- Imaging this admission consistent with soft tissue gangrene and additional imaging also consistent with moderate to severe PAD --OP FU w/ vascular surgery in 4 weeks for staple removal  Peripheral artery disease --Confirmed on imaging this admission  -- Continue atorvastatin and aspirin  Neuropathy/phantom limb pain -- Continue Lyrica to 150 mg TID -- Continue OxyContin to 20 mg BID -- Oxy IR 10 mg every 4 hours prn for breakthrough pain -- Continue scheduled  baclofen 5 mg TID  Mood disorder /anxiety/insomnia -- Continue Seroquel-may need to increase dose if treatment of pain not helpful in decreasing insomnia  Chronic blood loss anemia in the  setting of left foot gangrene Baseline hemoglobin around 12 --Hgb 10.5 >8.5 >8.1 >7.8 >7.2 > 7.5 > 7.7 --Iron 13 with a saturation of 4-given IV iron followed by oral iron  --Transfuse for hemoglobin less than 7.0 or active bleeding --Repeat labs a.m. 5/6  Polysubstance abuse: EtOH and tobacco  -- Continue multivitamin, thiamine, folic acid and nicotine patch -- Recommend outpatient substance abuse treatment  Physical deconditioning/ambulatory dysfunction secondary to new BKA --PT and OT recommend SNF for rehabilitative therapies -- Was deconditioned prior to admission noting was wheelchair-bound for 2 months prior to admission -Over the past 72 hours patient's mobility has markedly improved.  PT now recommending outpatient services and after inquiry made they have stated patient is appropriate to discharge to a homeless shelter with PT services, rolling walker and wheelchair.    Other problems: Hypovolemic hyponatremia:  Resolved  Hypomagnesemia Resolved  Hypokalemia:  Resolved     Data Reviewed: Basic Metabolic Panel: Recent Labs  Lab 08/14/20 0150  NA 133*  K 3.8  CL 98  CO2 30  GLUCOSE 101*  BUN 7  CREATININE 0.60*  CALCIUM 8.6*   CBC: Recent Labs  Lab 08/14/20 0150  WBC 9.5  HGB 7.7*  HCT 24.6*  MCV 83.4  PLT 602*     Studies: No results found.  Scheduled Meds: . aspirin EC  325 mg Oral Daily  . atorvastatin  40 mg Oral Daily  . baclofen  5 mg Oral TID  . enoxaparin (LOVENOX) injection  40 mg Subcutaneous Q24H  . ferrous sulfate  325 mg Oral BID WC  . folic acid  1 mg Oral Daily  . multivitamin with minerals  1 tablet Oral Daily  . nicotine  21 mg Transdermal Daily  . oxyCODONE  20 mg Oral Q12H  . pregabalin  150 mg Oral TID  . senna-docusate  2 tablet Oral BID  . sodium chloride flush  3 mL Intravenous Q12H  . thiamine  100 mg Oral Daily   Or  . thiamine  100 mg Intravenous Daily   Continuous Infusions: . sodium chloride 250 mL  (08/05/20 2120)    Active Problems:   Alcohol abuse with alcohol-induced mood disorder (HCC)   Gangrene of foot (HCC)   Gangrene (HCC)   S/P BKA (below knee amputation) unilateral, left (HCC)   Neuropathic pain, leg, left   Consultants:  Psychiatry  Vascular surgery  Procedures:  Adominal aortogram, 08/04/2020, Dr. Myra Gianotti  Left BKA, 08/05/2020, Dr. Myra Gianotti   Antibiotics:  Vancomycin 4/22>>  Cefepime 4/24 - 4/28  Zosyn 4/22 - 4/28    Time spent: 20 minutes    Junious Silk ANP  [facility] 7 am - 330 pm/M-F for direct patient care and secure chat Please refer to Amion for contact info 17  days

## 2020-08-17 NOTE — Progress Notes (Signed)
Physical Therapy Treatment Patient Details Name: Jake Nguyen MRN: [id number] DOB: 11/28/1965 Today's Date: 08/17/2020    History of Present Illness 55 y.o. male presents to [facility] ED on 07/31/2020 with worsening L foot pain. Pt with history of L foot gangrene for ~1 month, there were plans for vascular surgery at [facility] however pt left AMA. He had LLE angiogram 4/26.  Pt S/P left BKA 4/27. PMH includes medical history significant of alcohol abuse, left foot gangrene.    PT Comments    Pt received in supine, agreeable to therapy session and with good participation and tolerance in transfer and wheelchair training this date. Pt propelled chair >364ft modI for BUE strengthening and needs Supervision for squat pivot to wheelchair only due to needing reminder to lock chair before pivoting back. Pt continues to benefit from PT services to progress toward functional mobility goals. Recommend PT follow-up when limb is healed enough for prosthesis. Discharge recommendations updated per discussion with Supervising PT [facility] and pt.   Follow Up Recommendations  No PT follow up;Other (comment);Supervision - Intermittent (PT follow-up when pt limb is appropriate for prosthetic)     Equipment Recommendations  Rolling walker with 5" wheels;Other (comment);Wheelchair (measurements PT) Lawyer [phone] lightweight amputee wheelchair with left amputee pad, anti tippers, desk armrests, and foot rest for right);Wheelchair cushion (18x16 pressure relieving cushion))    Recommendations for Other Services       Precautions / Restrictions Precautions Precautions: Fall Precaution Comments: watch HR, L BKA Restrictions Weight Bearing Restrictions: Yes LLE Weight Bearing: Non weight bearing    Mobility  Bed Mobility Overal bed mobility: Modified Independent Bed Mobility: Supine to Sit;Sit to Supine     Supine to sit: Modified independent (Device/Increase time) Sit to supine: Modified  independent (Device/Increase time)   General bed mobility comments: HOB up    Transfers Overall transfer level: Needs assistance Equipment used: None Transfers: Squat Pivot Transfers     Squat pivot transfers: Supervision     General transfer comment: bed<>wheelchair, needs cues to lock brakes first but he can perform this unassisted    Corporate treasurer Wheelchair mobility: Yes Wheelchair propulsion: Both upper extremities;Right lower extremity Wheelchair parts: Needs assistance Distance: 300 Wheelchair Assistance Details (indicate cue type and reason): pt given verbal cues and visual demo for technique to remove/replace leg rests and use of brakes, after demo pt able to perform brakes on his own but needs reminders; pt modI to propel wheelchair and turn just needs occasional cues for environmental awareness.  Modified Rankin (Stroke Patients Only)       Balance Overall balance assessment: Needs assistance Sitting-balance support: No upper extremity supported;Feet supported Sitting balance-Leahy Scale: Good                                      Cognition Arousal/Alertness: Awake/alert Behavior During Therapy: WFL for tasks assessed/performed Overall Cognitive Status: Impaired/Different from baseline Area of Impairment: Safety/judgement;Problem solving                         Safety/Judgement: Decreased awareness of safety   Problem Solving: Requires verbal cues General Comments: improved participation this date, pt with good following of 2-step cues but needs safety reminders (to lock brakes prior to standing from [facility])      Exercises      General Comments General comments (skin integrity,  edema, etc.): VSS on RA (SpO2 sensor not working poor pleth but pt not short of breath with exertion)      Pertinent Vitals/Pain Pain Assessment: Faces Faces Pain Scale: Hurts little more Pain Location: L residual limb,  especially after exertion Pain Descriptors / Indicators: Discomfort;Grimacing Pain Intervention(s): Monitored during session;Repositioned;Other (comment) (pt notified to ask RN for pain meds once he is ready since he is due, he said he will ask after session)    Home Living                      Prior Function            PT Goals (current goals can now be found in the care plan section) Acute Rehab PT Goals Patient Stated Goal: to have less pain and get out of the hospital soon PT Goal Formulation: With patient Time For Goal Achievement: 08/22/20 Potential to Achieve Goals: Fair Progress towards PT goals: Progressing toward goals    Frequency    Min 3X/week      PT Plan Discharge plan needs to be updated;Equipment recommendations need to be updated    Co-evaluation              AM-PAC PT "6 Clicks" Mobility   Outcome Measure  Help needed turning from your back to your side while in a flat bed without using bedrails?: None Help needed moving from lying on your back to sitting on the side of a flat bed without using bedrails?: None Help needed moving to and from a bed to a chair (including a wheelchair)?: A Little Help needed standing up from a chair using your arms (e.g., wheelchair or bedside chair)?: A Little Help needed to walk in hospital room?: A Little Help needed climbing 3-5 steps with a railing? : A Little 6 Click Score: 20    End of Session   Activity Tolerance: Patient tolerated treatment well Patient left: in bed;with call bell/phone within reach;with bed alarm set;with family/visitor present (male friend present) Nurse Communication: Mobility status;Other (comment) (pt does not want pain meds yet he was instructed to call if he needs them) PT Visit Diagnosis: Other abnormalities of gait and mobility (R26.89);Pain;Unsteadiness on feet (R26.81);Difficulty in walking, not elsewhere classified (R26.2) Pain - Right/Left: Left Pain - part of body:  Leg     Time: 8676-7209 PT Time Calculation (min) (ACUTE ONLY): 24 min  Charges:  $Therapeutic Exercise: 8-22 mins $Wheel Chair Management: 8-22 mins                     Waverley Krempasky P., PTA Acute Rehabilitation Services Pager: [phone] Office: [phone]   Angus Palms 08/17/2020, 1:54 PM

## 2020-08-17 NOTE — Progress Notes (Addendum)
CSW spoke with receptionist at [facility] who states they do not offer any type of emergency shelter or housing for clients.  CSW attempted to reach [place] at [employer] in [place] without success - a voicemail was left requesting a return call.  CSW spoke with receptionist at [employer] who states there are 84 individuals on the waiting list but was agreeable to add patient to list.  CSW spoke with receptionist at [facility] who states they would not be able to accept patient due to his physical limitations.  CSW spoke with receptionist at [employer] [facility] in [place] who states all housing referrals must be made through the [facility] housing help line at [phone].  CSW attempted to reach staff at the housing help line - no answer, a voicemail was left requesting a return call. A secure e-mail was provided on voicemail so secure e-mail was sent requesting follow up.  Edwin Dada, MSW, LCSW Transitions of Care  Clinical Social Worker II [phone]

## 2020-08-18 NOTE — TOC Progression Note (Signed)
Transition of Care [facility]) - Progression Note    Patient Details  Name: ELDRIDGE MARCOTT MRN: [id number] Date of Birth: [birth year]  Transition of Care [facility]) CM/SW Contact  Janae Bridgeman, RN Phone Number: 08/18/2020, 10:33 AM  Clinical Narrative:    Case management spoke with the patient at the bedside regarding discharge planning.  The patient does not have any accepting bed offers at this time considering patient with history of alcohol abuse, no insurance and history of signed out AMA from skilled nursing homes in the past.  The patient was updated that he will be most likely discharged to a homeless shelter with handicap access for care after discharge from the hospital.  The patient has a brother who lives in [place] locally but the patient states that he does not have a home as well.  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  I spoke with the patient and he states that he will reach out to his sister in [place] and make her aware that he will be discharged to a shelter unless family can provide transportation and/or support in [place].  CM and MSW will continue to follow the patient for discharge to homeless shelter with handicap access.   Expected Discharge Plan: Skilled Nursing Facility Barriers to Discharge: Inadequate or no insurance,Continued Medical Work up,No SNF bed  Expected Discharge Plan and Services Expected Discharge Plan: Skilled Nursing Facility In-house Referral: Clinical Social Psychologist, prison and probation services (email sent to [employer] financial counselor to start Medicaid) Discharge Planning Services: CM Consult,Indigent Health Clinic   Living arrangements for the past 2 months: Boarding House                                       Social Determinants of Health (SDOH) Interventions    Readmission Risk Interventions Readmission Risk Prevention Plan  08/10/2020  Transportation Screening Complete  PCP or Specialist Appt within 5-7 Days Complete  Home Care Screening Complete  Medication Review (RN CM) Complete  Some recent data might be hidden

## 2020-08-18 NOTE — Progress Notes (Signed)
Mobility Specialist: Progress Note   08/18/20 1405  Mobility  Activity Ambulated in hall  Level of Assistance Contact guard assist, steadying assist  Assistive Device Front wheel walker  Distance Ambulated (ft) 310 ft  Mobility Response Tolerated well  Mobility performed by Mobility specialist  $Mobility charge 1 Mobility   Pre-Mobility: 118 HR, 100% SpO2  Pt c/o hands cramping as well as feeling overall fatigued during ambulation. Pt otherwise asx. Pt back to bed after walk.   [facility] Aissatou Fronczak Mobility Specialist Mobility Specialist Phone: [phone]

## 2020-08-18 NOTE — Progress Notes (Signed)
[facility] PROGRESS NOTE  Jake Nguyen JHE:[id number] DOB: [birth year] DOA: 07/31/2020 PCP: Patient, No Pcp Per (Inactive)  Status: Remains inpatient appropriate because:Ongoing active pain requiring inpatient pain management and Unsafe d/c plan   Dispo:  Patient From: Home  Planned Disposition: Shelter vs SNF  Medically stable for discharge: Yes  Difficult to place patient: Yes   Level of care: Med-Surg  Code Status: Full Family Communication: Patient, significant other on 5/5 DVT prophylaxis: Lovenox Vaccination status: Unvaccinated but as of 5/2 has agreed to take the first shot of Moderna    HPI:  55 year old male with past medical history significant for EtOH abuse, left foot gangrene who presented to [facility], ED with worsening left foot pain.  Patient developed left foot gangrene about 1 month ago, evaluated at [facility] with plan for vascular surgery evaluation; however patient left AMA.  He reports being wheelchair-bound for past 2 months because of an bearable left foot pain.  In the ED, his left foot was noted to be with gangrenous changes, was tachycardic without fever.  Lactic acid within normal limits.  WBC within normal limits.  Cova-19 PCR/influenza A/B PCR negative.  Left foot x-ray with progression of soft tissue gas compatible with infection, negative for osteomyelitis.  Vascular surgery consulted.  Hospital service consulted for further evaluation and management.  Since admission patient underwent aortogram that revealed patent superficial femoral artery but unfortunately was occluded with reconstitution of the peroneal artery where it collateralized with the posterior tibial artery.  Patient subsequently underwent BKA.  PT and OT recommending short-term SNF for rehab.  Patient has had significant issues with ongoing postoperative pain manifesting as phantom limb pain as well as shooting neuropathic pain.  In addition during the initial  portion of the hospitalization patient was placed under IVC due to reported suicidal ideation.  Evaluated by psychiatry and was deemed not an imminent risk to self or others therefore IVC rescinded.  Subjective: Patient reports pain is now appropriately controlled.  Discussed potential discharge options to prevent discharge to shelter or the streets.  He reports that his brother is also homeless.  He does not have anywhere to go.  We mentioned contacting family in [place] and he was open to [place] calling them but did not seem to think that they would accept him.  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  Made him aware that he currently is not eligible for acute rehab given significant improvement in mobility will explore option of potentially discharging to SNF as a custodial care patient  Objective: Vitals:   08/18/20 0432 08/18/20 0755  BP: 103/69 103/75  Pulse: 100 (!) 104  Resp: 18 18  Temp: 98.7 F (37.1 C) 98.6 F (37 C)  SpO2: 94% 98%    Intake/Output Summary (Last 24 hours) at 08/18/2020 0801 Last data filed at 08/18/2020 0545 Gross per 24 hour  Intake 240 ml  Output 1575 ml  Net -1335 ml   Filed Weights   07/31/20 1300 07/31/20 2135  Weight: 68.9 kg 67.3 kg    Exam:  Constitutional: Awake, calm, no acute distress Respiratory: Lungs are clear, room air Cardiovascular: S1-S2, regular pulse, no peripheral edema Abdomen: LBM 5/9, Musculoskeletal: Left BKA tender to minimal palpation.  Surgical site dry and intact Neurologic: Cranial nerves intact, no focal neurological deficits, gait and ambulation significantly affected by recent BKA.  Requires walker for short  distances and wheelchair for longer distances Psychiatric: Alert and oriented x3.  Pleasant affect.   Assessment/Plan: Acute problems: Left foot gangrene, severe-s/p left BKA 4/27 -- Imaging this admission consistent with soft tissue gangrene  and additional imaging also consistent with moderate to severe PAD --OP FU w/ vascular surgery in 4 weeks for staple removal (5/27)  Peripheral artery disease --Confirmed on imaging this admission  -- Continue atorvastatin and aspirin  Neuropathy/phantom limb pain -- Continue Lyrica to 150 mg TID -- Continue OxyContin to 20 mg BID -- Oxy IR 10 mg every 4 hours prn for breakthrough pain -- Continue scheduled baclofen 5 mg TID  Mood disorder /anxiety/insomnia -- Continue Seroquel  Chronic blood loss anemia in the setting of left foot gangrene Baseline hemoglobin around 12 --Hgb 10.5 >8.5 >8.1 >7.8 >7.2 > 7.5 > 7.7 --Iron 13 with a saturation of 4-given IV iron followed by oral iron  --Transfuse for hemoglobin less than 7.0 or active bleeding  Polysubstance abuse: EtOH and tobacco  -- Continue multivitamin, thiamine, folic acid and nicotine patch -- Recommend outpatient substance abuse treatment  Physical deconditioning/ambulatory dysfunction secondary to new BKA --PT and OT recommend SNF for rehabilitative therapies -- Was deconditioned prior to admission noting was wheelchair-bound for 2 months prior to admission -Over the past 72 hours patient's mobility has markedly improved.  PT now recommending outpatient services and after inquiry made they have stated patient is appropriate to discharge to a homeless shelter with PT services, rolling walker and wheelchair. -Given homeless state and requirement for narcotics and other controlled substances to manage his neuropathic and phantom limb pain as well as Medicaid pending patient may need to be temporarily placed at skilled nursing facility while waiting on Medicaid to become approved.  Given mobility issues and inability to work disability has been applied for but has not yet been approved.  He will likely will need to reside in a skilled nursing facility if possible for custodial care.    Other problems: Hypovolemic hyponatremia:   Resolved  Hypomagnesemia Resolved  Hypokalemia:  Resolved     Data Reviewed: Basic Metabolic Panel: Recent Labs  Lab 08/14/20 0150  NA 133*  K 3.8  CL 98  CO2 30  GLUCOSE 101*  BUN 7  CREATININE 0.60*  CALCIUM 8.6*   CBC: Recent Labs  Lab 08/14/20 0150  WBC 9.5  HGB 7.7*  HCT 24.6*  MCV 83.4  PLT 602*     Studies: No results found.  Scheduled Meds: . aspirin EC  325 mg Oral Daily  . atorvastatin  40 mg Oral Daily  . baclofen  5 mg Oral TID  . enoxaparin (LOVENOX) injection  40 mg Subcutaneous Q24H  . ferrous sulfate  325 mg Oral BID WC  . folic acid  1 mg Oral Daily  . multivitamin with minerals  1 tablet Oral Daily  . nicotine  21 mg Transdermal Daily  . oxyCODONE  20 mg Oral Q12H  . pregabalin  150 mg Oral TID  . senna-docusate  2 tablet Oral BID  . sodium chloride flush  3 mL Intravenous Q12H  . thiamine  100 mg Oral Daily   Or  . thiamine  100 mg Intravenous Daily   Continuous Infusions: . sodium chloride 250 mL (08/05/20 2120)    Active Problems:   Alcohol abuse with alcohol-induced mood disorder (HCC)   Gangrene of foot (HCC)   Gangrene (HCC)   S/P BKA (below knee amputation) unilateral, left (HCC)   Neuropathic  pain, leg, left   Consultants:  Psychiatry  Vascular surgery  Procedures:  Adominal aortogram, 08/04/2020, Dr. Myra Gianotti  Left BKA, 08/05/2020, Dr. Myra Gianotti   Antibiotics:  Vancomycin 4/22>>  Cefepime 4/24 - 4/28  Zosyn 4/22 - 4/28    Time spent: 20 minutes    Junious Silk ANP  [facility] 7 am - 330 pm/M-F for direct patient care and secure chat Please refer to Amion for contact info 18  days

## 2020-08-19 NOTE — Progress Notes (Signed)
[facility] PROGRESS NOTE  LULA MICHAUX ZWC:[id number] DOB: 1966/01/12 DOA: 07/31/2020 PCP: Patient, No Pcp Per (Inactive)  Status: Remains inpatient appropriate because:Ongoing active pain requiring inpatient pain management and Unsafe d/c plan   Dispo:  Patient From: Home  Planned Disposition: Shelter vs SNF  Medically stable for discharge: Yes  Difficult to place patient: Yes   Level of care: Med-Surg  Code Status: Full Family Communication: Patient, significant other on 5/5 DVT prophylaxis: Lovenox Vaccination status: Unvaccinated but as of 5/2 has agreed to take the first shot of Moderna    HPI:  55 year old male with past medical history significant for EtOH abuse, left foot gangrene who presented to [facility], ED with worsening left foot pain.  Patient developed left foot gangrene about 1 month ago, evaluated at [facility] with plan for vascular surgery evaluation; however patient left AMA.  He reports being wheelchair-bound for past 2 months because of an bearable left foot pain.  In the ED, his left foot was noted to be with gangrenous changes, was tachycardic without fever.  Lactic acid within normal limits.  WBC within normal limits.  Cova-19 PCR/influenza A/B PCR negative.  Left foot x-ray with progression of soft tissue gas compatible with infection, negative for osteomyelitis.  Vascular surgery consulted.  Hospital service consulted for further evaluation and management.  Since admission patient underwent aortogram that revealed patent superficial femoral artery but unfortunately was occluded with reconstitution of the peroneal artery where it collateralized with the posterior tibial artery.  Patient subsequently underwent BKA.  PT and OT recommending short-term SNF for rehab.  Patient has had significant issues with ongoing postoperative pain manifesting as phantom limb pain as well as shooting neuropathic pain.  In addition during the initial  portion of the hospitalization patient was placed under IVC due to reported suicidal ideation.  Evaluated by psychiatry and was deemed not an imminent risk to self or others therefore IVC rescinded.  Subjective: Alert, sitting up in bed doing a crossword puzzle.  States pain adequately controlled.  Has given case manager permission to call his sister in [place] to inquire about possible discharging home with her  Objective: Vitals:   08/18/20 2317 08/19/20 0326  BP: 99/65 93/71  Pulse: 97 100  Resp: 18 18  Temp: 98.4 F (36.9 C) 98.3 F (36.8 C)  SpO2: 97% 95%    Intake/Output Summary (Last 24 hours) at 08/19/2020 0809 Last data filed at 08/19/2020 2353 Gross per 24 hour  Intake 1553 ml  Output 1860 ml  Net -307 ml   Filed Weights   07/31/20 1300 07/31/20 2135  Weight: 68.9 kg 67.3 kg    Exam:  Constitutional: Alert, calm, no acute distress Respiratory: Lung sounds are clear to auscultation anteriorly with no increased work of breathing and he remained stable on room air Cardiovascular: Normal heart sounds, exertional tachycardia only which has improved over the past several days, no peripheral edema Abdomen: LBM 5/10, soft nontender nondistended with normoactive bowel sounds.  Eating well Musculoskeletal: Left BKA site unremarkable Neurologic: Cranial nerves are intact, no focal neurological deficits, with recent BKA ambulates with walker and wheelchair Psychiatric: Alert and oriented x3, pleasant affect.   Assessment/Plan: Acute problems: Left foot gangrene, severe-s/p left BKA 4/27 -- Imaging this admission consistent with soft tissue gangrene and additional imaging also consistent with moderate to severe PAD --OP FU w/ vascular surgery in 4 weeks for staple removal (5/27)  Peripheral artery disease --Confirmed on imaging this admission  --  Continue atorvastatin and aspirin  Neuropathy/phantom limb pain -- Continue Lyrica, OxyContin, short acting Oxy IR for  breakthrough pain and scheduled baclofen  Mood disorder /anxiety/insomnia -- Continue Seroquel  Chronic blood loss anemia in the setting of left foot gangrene Baseline hemoglobin around 12 --Hgb 10.5 >8.5 >8.1 >7.8 >7.2 > 7.5 > 7.7 --Iron 13 with a saturation of 4-given IV iron followed by oral iron  --Transfuse for hgb </= 7.0 or if active bleeding  Polysubstance abuse: EtOH and tobacco  -- Continue multivitamin, thiamine, folic acid and nicotine patch  Physical deconditioning/ambulatory dysfunction secondary to new BKA --PT and OT recommend SNF for rehabilitative therapies -- Was deconditioned prior to admission noting was wheelchair-bound for 2 months prior to admission -Over the past 72 hours patient's mobility has markedly improved improvement in pain management -- Still trying to locate safe discharge disposition and are hopeful he can discharge with family intact    Other problems: Hypovolemic hyponatremia:  Resolved  Hypomagnesemia Resolved  Hypokalemia:  Resolved     Data Reviewed: Basic Metabolic Panel: Recent Labs  Lab 08/14/20 0150  NA 133*  K 3.8  CL 98  CO2 30  GLUCOSE 101*  BUN 7  CREATININE 0.60*  CALCIUM 8.6*   CBC: Recent Labs  Lab 08/14/20 0150  WBC 9.5  HGB 7.7*  HCT 24.6*  MCV 83.4  PLT 602*     Studies: No results found.  Scheduled Meds: . aspirin EC  325 mg Oral Daily  . atorvastatin  40 mg Oral Daily  . baclofen  5 mg Oral TID  . enoxaparin (LOVENOX) injection  40 mg Subcutaneous Q24H  . ferrous sulfate  325 mg Oral BID WC  . folic acid  1 mg Oral Daily  . multivitamin with minerals  1 tablet Oral Daily  . nicotine  21 mg Transdermal Daily  . oxyCODONE  20 mg Oral Q12H  . pregabalin  150 mg Oral TID  . senna-docusate  2 tablet Oral BID  . sodium chloride flush  3 mL Intravenous Q12H  . thiamine  100 mg Oral Daily   Or  . thiamine  100 mg Intravenous Daily   Continuous Infusions: . sodium chloride 250 mL  (08/05/20 2120)    Active Problems:   Alcohol abuse with alcohol-induced mood disorder (HCC)   Gangrene of foot (HCC)   Gangrene (HCC)   S/P BKA (below knee amputation) unilateral, left (HCC)   Neuropathic pain, leg, left   Consultants:  Psychiatry  Vascular surgery  Procedures:  Adominal aortogram, 08/04/2020, Dr. Myra Gianotti  Left BKA, 08/05/2020, Dr. Myra Gianotti   Antibiotics:  Vancomycin 4/22>>  Cefepime 4/24 - 4/28  Zosyn 4/22 - 4/28    Time spent: 20 minutes    Junious Silk ANP  [facility] 7 am - 330 pm/M-F for direct patient care and secure chat Please refer to Amion for contact info 19  days

## 2020-08-19 NOTE — Progress Notes (Signed)
Mobility Specialist - Progress Note   08/19/20 1558  Mobility  Activity Ambulated in hall  Level of Assistance Standby assist, set-up cues, supervision of patient - no hands on  Assistive Device Wheelchair  Distance Ambulated (ft) 470 ft  Mobility Ambulated with assistance in hallway  Mobility Response Tolerated well  Mobility performed by Mobility specialist  $Mobility charge 1 Mobility   Pt required reminders to lock the breaks when getting in/out of the wheel chair. Distance limited by fatigue mainly in his biceps. Pt back in bed after walk.   Mamie Levers Mobility Specialist Mobility Specialist Phone: [phone]

## 2020-08-19 NOTE — Progress Notes (Signed)
Physical Therapy Treatment Patient Details Name: Jake Nguyen MRN: [id number] DOB: 10-Dec-1965 Today's Date: 08/19/2020    History of Present Illness 55 y.o. male presents to [facility] ED on 07/31/2020 with worsening L foot pain. Pt with history of L foot gangrene for ~1 month, there were plans for vascular surgery at [facility] however pt left AMA. He had LLE angiogram 4/26.  Pt S/P left BKA 4/27. PMH includes medical history significant of alcohol abuse, left foot gangrene.    PT Comments    Pt received seated EOB, pleasantly agreeable to therapy session and with good participation and tolerance for further wheelchair set-up and mobility training. Pt with improved activity tolerance in wheelchair this date but continues to need verbal/tactile cues for wheelchair setup and safety prior to transfers, but does not need physical assist for squat pivot tf or to propel chair. Difficulty achieving exertional SpO2 reading due to poor signal, RN notified no acute s/sx of distress and no increased work of breathing for community distance wheelchair mobility. Pt continues to benefit from PT services to progress toward functional mobility goals. Discharge recommendations below remain appropriate.   Follow Up Recommendations  No PT follow up;Other (comment);Supervision - Intermittent (PT follow-up when pt limb is appropriate for prosthetic)     Equipment Recommendations  Rolling walker with 5" wheels;Other (comment);Wheelchair (measurements PT) Lawyer [phone] lightweight amputee wheelchair with left amputee pad, anti tippers, desk armrests, and foot rest for right);Wheelchair cushion (18x16 pressure relieving cushion))    Recommendations for Other Services       Precautions / Restrictions Precautions Precautions: Fall Precaution Comments: L BKA Restrictions Weight Bearing Restrictions: Yes LLE Weight Bearing: Non weight bearing    Mobility  Bed Mobility Overal bed mobility: Modified  Independent                  Transfers Overall transfer level: Needs assistance Equipment used: Rolling walker (2 wheeled) Transfers: Squat Pivot Transfers Sit to Stand: Supervision         General transfer comment: supervision for safety, cues for brakes and to move leg rests out of the way needed  Ambulation/Gait                 Dance movement psychotherapist Wheelchair mobility: Yes Wheelchair propulsion: Both upper extremities;Right lower extremity Wheelchair parts: Supervision/cueing Distance: 1500 Wheelchair Assistance Details (indicate cue type and reason): pt given verbal cues and visual demo for technique to remove/replace leg rests and use of brakes, after demo pt able to perform brakes and remove/replace leg rests on his own but needs reminders on technique; modI to propel w/c and turn, improved activity tolerance in w/c  Modified Rankin (Stroke Patients Only)       Balance Overall balance assessment: Needs assistance Sitting-balance support: No upper extremity supported;Feet supported Sitting balance-Leahy Scale: Good Sitting balance - Comments: leaning to remove/replace wheelchair parts 6-8" outside BOS no LOB     Standing balance-Leahy Scale: Poor Standing balance comment: reliant on BUE support                            Cognition Arousal/Alertness: Awake/alert Behavior During Therapy: WFL for tasks assessed/performed   Area of Impairment: Safety/judgement                         Safety/Judgement: Decreased awareness of  safety   Problem Solving: Requires verbal cues General Comments: improved participation this date, pt with good following of 2-step cues but needs safety reminders (to lock brakes prior to standing from [facility]), he has fair carryover of wheelchair set-up within session but decreased recall from previous session, will continue to reinforce.      Exercises Other  Exercises Other Exercises: verbal review for seated/standing HEP and single leg bridges, pt reports he has been performing with mobility tech and on his own. Other Exercises: wheelchair mobility billed as TE for B UE/R LE strengthening    General Comments General comments (skin integrity, edema, etc.): unable to get pleth reading on either hand with portable sensor and with finger sensor on tele machine, RN notified. Pt in no acute distress and no increased work of breathing; brief reading of 94% SpO2 with poor signal and HR not reading.      Pertinent Vitals/Pain Pain Assessment: 0-10 Pain Score: 5  Faces Pain Scale: Hurts little more Pain Location: L LE Pain Descriptors / Indicators: Sore;Discomfort Pain Intervention(s): Monitored during session    Home Living                      Prior Function            PT Goals (current goals can now be found in the care plan section) Acute Rehab PT Goals Patient Stated Goal: to have less pain and get out of the hospital soon PT Goal Formulation: With patient Time For Goal Achievement: 08/22/20 Potential to Achieve Goals: Good Progress towards PT goals: Progressing toward goals    Frequency    Min 3X/week      PT Plan Current plan remains appropriate    Co-evaluation              AM-PAC PT "6 Clicks" Mobility   Outcome Measure  Help needed turning from your back to your side while in a flat bed without using bedrails?: None Help needed moving from lying on your back to sitting on the side of a flat bed without using bedrails?: None Help needed moving to and from a bed to a chair (including a wheelchair)?: A Little Help needed standing up from a chair using your arms (e.g., wheelchair or bedside chair)?: A Little Help needed to walk in hospital room?: A Little Help needed climbing 3-5 steps with a railing? : A Little 6 Click Score: 20    End of Session Equipment Utilized During Treatment: Gait belt Activity  Tolerance: Patient tolerated treatment well Patient left: with call bell/phone within reach;in bed (seated EOB to eat, RN OK to leave bed alarm off at this time due to pt improving safety awareness with pivots to [facility]) Nurse Communication: Mobility status;Other (comment) (poor pleth signal for HR) PT Visit Diagnosis: Other abnormalities of gait and mobility (R26.89);Pain;Unsteadiness on feet (R26.81);Difficulty in walking, not elsewhere classified (R26.2) Pain - Right/Left: Left Pain - part of body: Leg     Time: 1310-1334 PT Time Calculation (min) (ACUTE ONLY): 24 min  Charges:  $Therapeutic Exercise: 8-22 mins $Therapeutic Activity: 8-22 mins                     Amanada Philbrick P., PTA Acute Rehabilitation Services Pager: [phone] Office: [phone]   Angus Palms 08/19/2020, 1:58 PM

## 2020-08-19 NOTE — Progress Notes (Addendum)
11:50am: CSW spoke with patient's sister Elnita Maxwell to discuss the possibility of her receiving him in [place] - Elnita Maxwell to discuss this option with her husband and return call to CSW.   8:30am: CSW received return call from [facility] housing help line stating there is only one emergency shelter in operation at this time and that there is a significantly long waiting list.  Edwin Dada, MSW, LCSW Transitions of Care  Clinical Social Worker II [phone]

## 2020-08-20 MED ORDER — MORPHINE SULFATE ER 30 MG PO TBCR
30.0000 mg | EXTENDED_RELEASE_TABLET | Freq: Two times a day (BID) | ORAL | Status: DC
Start: 2020-08-20 — End: 2020-08-27
  Administered 2020-08-20 – 2020-08-27 (×14): 30 mg via ORAL
  Filled 2020-08-20 (×14): qty 1

## 2020-08-20 NOTE — Progress Notes (Addendum)
1:15pm: CSW spoke with Diplomatic Services operational officer at [facility] - there are no beds available at this time, was told to return call after Sunday.  CSW spoke with Victorino Dike at [facility] who states patient needs to call [facility] to complete an assessment to determine eligibility for housing - CSW will inform patient of information. Victorino Dike informed CSW that there was no pending disability application on file at this time.  CSW attempted to reach [facility] at [facility] to request she submit a referral to [employer] on patient's behalf - a voicemail and email was sent requesting her assistance.  Revonda Standard, NP was advised by Alinda Money at [facility] pharmacy that the patient will be set up with an "AR account" so that he is able to obtain his medications after discharge.  11:45am: CSW spoke with patient's sister Elnita Maxwell to follow up from yesterday's conversation - she states that at this time she cannot offer the patient a place to stay due to financial and health concerns of her own.  Edwin Dada, MSW, LCSW Transitions of Care  Clinical Social Worker II [phone]

## 2020-08-20 NOTE — Progress Notes (Addendum)
[facility] PROGRESS NOTE  Jake Nguyen ZOX:[id number] DOB: 03-May-1965 DOA: 07/31/2020 PCP: Patient, No Pcp Per (Inactive)  Status: Remains inpatient appropriate because:Ongoing active pain requiring inpatient pain management and Unsafe d/c plan   Dispo:  Patient From: Home  Planned Disposition: Shelter vs streets  Medically stable for discharge: Yes  Difficult to place patient: Yes patient currently has Medicaid pending but has no funding source to pay for medications that will not be available through the [facility] health and wellness community clinic after discharge.  Primary concern is that patient (at least for the short-term) will need up to 3 to 6 months of chronic narcotics consisting of short acting Oxy IR and MS Contin as well as baclofen and Lyrica to control severe neuropathic and phantom limb pain after left BKA.  At present [facility] health currently is only able to provide a 1 week supply of narcotics per our policy.  I have been speaking with [facility] pharmacist about options.   Level of care: Med-Surg  Code Status: Full Family Communication: Patient, significant other on 5/5 DVT prophylaxis: Lovenox Vaccination status: Unvaccinated but as of 5/2 has agreed to take the first shot of Moderna    HPI:  55 year old male with past medical history significant for EtOH abuse, left foot gangrene who presented to [facility], ED with worsening left foot pain.  Patient developed left foot gangrene about 1 month ago, evaluated at [facility] with plan for vascular surgery evaluation; however patient left AMA.  He reports being wheelchair-bound for past 2 months because of an bearable left foot pain.  Initial work-up in the ER consistent with soft tissue gangrene but no osteomyelitis.  Vascular service was consulted.  Aortogram revealed significant vascular disease and patient subsequently underwent left BKA.  Initial recommendation was for SNF placement but with adequate control of  severe phantom limb and neuropathic pain patient mobility has improved to the point that he does not qualify for short-term rehab.  As noted above, further concerning issues after discharge is patient's lack of access to necessary pain medications to ensure adequate mobility after discharge.  Also he is a new amputee and has no options for discharge except to a shelter (currently no available beds) or the streets with his brother.   Subjective: In bed again working on crossword puzzles.  Brother at bedside.  At the time I saw the patient we were still awaiting word from his sister about whether he could stay there but subsequently we have received word that she will not be able to take him in.  She lives in [place].  Objective: Vitals:   08/20/20 0352 08/20/20 0400  BP: 97/62   Pulse: (!) 103 100  Resp: 19   Temp: 98.6 F (37 C)   SpO2: 93% 93%    Intake/Output Summary (Last 24 hours) at 08/20/2020 0846 Last data filed at 08/20/2020 0600 Gross per 24 hour  Intake 1203 ml  Output 1780 ml  Net -577 ml   Filed Weights   07/31/20 1300 07/31/20 2135  Weight: 68.9 kg 67.3 kg    Exam:  Constitutional:, Calm, no acute distress and appears very comfortable Respiratory: Lungs are clear to auscultation, remained stable on room air without any increased work of breathing at rest. Cardiovascular: S1-S2, no JVD, no peripheral edema, regular pulse Abdomen: LBM 5/10, soft nontender nondistended.  Eating well. Musculoskeletal: Left BKA site unremarkable Neurologic: Cranial nerves are intact, no focal neurological deficits, with recent BKA ambulates with walker and  wheelchair Psychiatric:, Pleasant interactive affect.  Alert and oriented x3   Assessment/Plan: Acute problems: Left foot gangrene, severe-s/p left BKA 4/27 -- Imaging this admission consistent with soft tissue gangrene and additional imaging also consistent with moderate to severe PAD --OP FU w/ vascular surgery in 4 weeks for  staple removal (5/27)  Peripheral artery disease --Confirmed on imaging this admission  -- Continue atorvastatin and aspirin  Neuropathy/phantom limb pain -- Continue Lyrica, OxyContin, short acting Oxy IR for breakthrough pain and scheduled baclofen -- Anticipate patient will need a minimum of 3 and a maximum of 6 months of MS Contin and short acting Oxy IR with hopeful eventual tapering and discontinuation of narcotics in favor of NSAIDs and Tylenol.  Anticipate will need to remain on baclofen and Lyrica for an indefinite amount of time.  Mood disorder /anxiety/insomnia -- Continue Seroquel  Chronic blood loss anemia in the setting of left foot gangrene Baseline hemoglobin around 12 --Hgb 10.5 >8.5 >8.1 >7.8 >7.2 > 7.5 > 7.7 --Iron 13 with a saturation of 4-given IV iron followed by oral iron  --Transfuse for hgb </= 7.0 or if active bleeding  Polysubstance abuse: EtOH and tobacco  -- Continue multivitamin, thiamine, folic acid and nicotine patch  Physical deconditioning/ambulatory dysfunction secondary to new BKA --PT and OT recommend SNF for rehabilitative therapies -- Was deconditioned prior to admission noting was wheelchair-bound for 2 months prior to admission -Over the past 72 hours patient's mobility has markedly improved improvement in pain management -- Still trying to locate safe discharge disposition and are hopeful he can discharge with family intact    Other problems: Hypovolemic hyponatremia:  Resolved  Hypomagnesemia Resolved  Hypokalemia:  Resolved     Data Reviewed: Basic Metabolic Panel: Recent Labs  Lab 08/14/20 0150  NA 133*  K 3.8  CL 98  CO2 30  GLUCOSE 101*  BUN 7  CREATININE 0.60*  CALCIUM 8.6*   CBC: Recent Labs  Lab 08/14/20 0150  WBC 9.5  HGB 7.7*  HCT 24.6*  MCV 83.4  PLT 602*     Studies: No results found.  Scheduled Meds: . aspirin EC  325 mg Oral Daily  . atorvastatin  40 mg Oral Daily  . baclofen  5 mg  Oral TID  . enoxaparin (LOVENOX) injection  40 mg Subcutaneous Q24H  . ferrous sulfate  325 mg Oral BID WC  . folic acid  1 mg Oral Daily  . multivitamin with minerals  1 tablet Oral Daily  . nicotine  21 mg Transdermal Daily  . oxyCODONE  20 mg Oral Q12H  . pregabalin  150 mg Oral TID  . senna-docusate  2 tablet Oral BID  . sodium chloride flush  3 mL Intravenous Q12H  . thiamine  100 mg Oral Daily   Or  . thiamine  100 mg Intravenous Daily   Continuous Infusions: . sodium chloride 250 mL (08/05/20 2120)    Active Problems:   Alcohol abuse with alcohol-induced mood disorder (HCC)   Gangrene of foot (HCC)   Gangrene (HCC)   S/P BKA (below knee amputation) unilateral, left (HCC)   Neuropathic pain, leg, left   Consultants:  Psychiatry  Vascular surgery  Procedures:  Adominal aortogram, 08/04/2020, Dr. Myra Gianotti  Left BKA, 08/05/2020, Dr. Myra Gianotti   Antibiotics:  Vancomycin 4/22>>  Cefepime 4/24 - 4/28  Zosyn 4/22 - 4/28    Time spent: 20 minutes    Junious Silk ANP  [facility] 7 am - 330 pm/M-F for  direct patient care and secure chat Please refer to Amion for contact info 20  days

## 2020-08-20 NOTE — Progress Notes (Addendum)
Mobility Specialist - Progress Note   08/20/20 1101  Mobility  Activity Ambulated in hall  Level of Assistance Standby assist, set-up cues, supervision of patient - no hands on  Assistive Device Wheelchair  Distance Ambulated (ft) 1300 ft  Mobility Ambulated independently in room  Mobility Response Tolerated well  Mobility performed by Mobility specialist  $Mobility charge 1 Mobility   Pt did not require any verbal cues for locking breaks w/ squat pivot transfer to/from the wheel chair. Pt endorsed shoulder and UE fatigue w/ distance, otherwise asx. Pt back in bed after mobility.   Jake Nguyen Mobility Specialist Mobility Specialist Phone: [phone]

## 2020-08-21 NOTE — Progress Notes (Signed)
Physical Therapy Treatment Patient Details Name: Jake Nguyen MRN: [id number] DOB: 1965/09/06 Today's Date: 08/21/2020    History of Present Illness 55 y.o. male presents to [facility] ED on 07/31/2020 with worsening L foot pain. Pt with history of L foot gangrene for ~1 month, there were plans for vascular surgery at [facility] however pt left AMA. He had LLE angiogram 4/26.  Pt S/P left BKA 4/27. PMH includes medical history significant of alcohol abuse, left foot gangrene.    PT Comments    Pt received seated EOB, agreeable to therapy session and with good tolerance for wheelchair mobility, gait and stair training, all for strengthening. Pt requesting to propel chair outside next session, notified RN and mobility tech to see if he can get MD order to go outside briefly since he hasn't been out of hospital in 21 days. Pt continues to benefit from PT services to progress toward functional mobility goals. Continue to recommend PT follow-up when pt limb is appropriate for prosthesis.  Follow Up Recommendations  No PT follow up;Other (comment);Supervision - Intermittent (PT follow-up when pt limb is appropriate for prosthetic)     Equipment Recommendations  Rolling walker with 5" wheels;Other (comment);Wheelchair (measurements PT) Lawyer [phone] lightweight amputee wheelchair with left amputee pad, anti tippers, desk armrests, and foot rest for right);Wheelchair cushion (18x16 pressure relieving cushion))    Recommendations for Other Services       Precautions / Restrictions Precautions Precautions: Fall Precaution Comments: L BKA Restrictions Weight Bearing Restrictions: Yes LLE Weight Bearing: Non weight bearing    Mobility  Bed Mobility Overal bed mobility: Modified Independent Bed Mobility: Supine to Sit;Sit to Supine     Supine to sit: Modified independent (Device/Increase time);HOB elevated Sit to supine: Modified independent (Device/Increase time)         Transfers Overall transfer level: Needs assistance Equipment used: Rolling walker (2 wheeled);None Transfers: Sit to/from [employer] Sit to Stand: Supervision Stand pivot transfers: Supervision Squat pivot transfers: Supervision     General transfer comment: squat pivot to return to bed from [facility] and sit<>stand from EOB<>RW to ambulate to wheelchair to propel to stairwell.  Ambulation/Gait Ambulation/Gait assistance: Supervision Gait Distance (Feet): 15 Feet Assistive device: Rolling walker (2 wheeled) Gait Pattern/deviations:  (swing-to pattern)     General Gait Details: HR 70's bpm resting, poor pleth reading with exertion; SpO2 99% on RA; BP 113/82 (92) post-exertion   Stairs Stairs: Yes Stairs assistance: Mod assist Stair Management: Step to pattern;Backwards;With walker;No rails Number of Stairs: 1 General stair comments: pt performed one 7" step backward via hop-up pattern, once up on first step attempted to have him hop up with RW support to second step, but pt with posterior bias and fearful to attempt/too anxious so returned down to floor, needed up to modA for balance; visual/verbal demo, may reattempt again next session or try forward hop-up   Corporate treasurer Wheelchair mobility: Yes Wheelchair propulsion: Both upper extremities Wheelchair parts: Supervision/cueing Distance: 100 Wheelchair Assistance Details (indicate cue type and reason): min cues for optimal wheelchair positioning in relation to bed and setup with fair carryover from previous session; no assist or cues needed to propel chair  Modified Rankin (Stroke Patients Only)       Balance Overall balance assessment: Needs assistance Sitting-balance support: No upper extremity supported;Feet supported Sitting balance-Leahy Scale: Good     Standing balance support: Bilateral upper extremity supported;Single extremity supported Standing balance-Leahy Scale:  Poor Standing balance comment: reliant on  BUE support                            Cognition Arousal/Alertness: Awake/alert Behavior During Therapy: WFL for tasks assessed/performed Overall Cognitive Status: No family/caregiver present to determine baseline cognitive functioning Area of Impairment: Safety/judgement                         Safety/Judgement: Decreased awareness of safety   Problem Solving: Requires verbal cues General Comments: improved safety awareness generally, pt with some slow processing at times with new instructions but anticipate he is close to cognitive baseline      Exercises Other Exercises Other Exercises: wheelchair mobility and stair/gait training billed as TE for BUE and RLE strengthening    General Comments General comments (skin integrity, edema, etc.): see gait comments for VS (VSS on RA)      Pertinent Vitals/Pain Pain Assessment: Faces Faces Pain Scale: Hurts a little bit Pain Location: L LE Pain Descriptors / Indicators: Sore;Discomfort Pain Intervention(s): Monitored during session;Repositioned    Home Living                      Prior Function            PT Goals (current goals can now be found in the care plan section) Acute Rehab PT Goals Patient Stated Goal: to have less pain and get out of the hospital soon PT Goal Formulation: With patient Time For Goal Achievement: 08/22/20 Potential to Achieve Goals: Good Progress towards PT goals: Progressing toward goals    Frequency    Min 3X/week      PT Plan Current plan remains appropriate    Co-evaluation              AM-PAC PT "6 Clicks" Mobility   Outcome Measure  Help needed turning from your back to your side while in a flat bed without using bedrails?: None Help needed moving from lying on your back to sitting on the side of a flat bed without using bedrails?: None Help needed moving to and from a bed to a chair (including a  wheelchair)?: None Help needed standing up from a chair using your arms (e.g., wheelchair or bedside chair)?: A Little Help needed to walk in hospital room?: A Little Help needed climbing 3-5 steps with a railing? : A Little 6 Click Score: 21    End of Session Equipment Utilized During Treatment: Gait belt Activity Tolerance: Patient tolerated treatment well Patient left: in bed;with call bell/phone within reach Nurse Communication: Mobility status PT Visit Diagnosis: Other abnormalities of gait and mobility (R26.89);Pain;Unsteadiness on feet (R26.81);Difficulty in walking, not elsewhere classified (R26.2) Pain - Right/Left: Left Pain - part of body: Leg     Time: 8299-3716 PT Time Calculation (min) (ACUTE ONLY): 20 min  Charges:  $Therapeutic Exercise: 8-22 mins                     Zyliah Schier P., PTA Acute Rehabilitation Services Pager: [phone] Office: [phone]   Angus Palms 08/21/2020, 4:27 PM

## 2020-08-21 NOTE — Progress Notes (Signed)
Occupational Therapy Treatment Patient Details Name: Jake Nguyen MRN: [id number] DOB: [birth year] Today's Date: 08/21/2020    History of present illness 55 y.o. male presents to [facility] ED on 07/31/2020 with worsening L foot pain. Pt with history of L foot gangrene for ~1 month, there were plans for vascular surgery at [facility] however pt left AMA. He had LLE angiogram 4/26.  Pt S/P left BKA 4/27. PMH includes medical history significant of alcohol abuse, left foot gangrene.   OT comments  Pt continues to progress with functional goals. Session focused on BSC transfers, LB ADLs seated EOB, functional mobility using RW to bathroom for toilet transfers using grab bars, clothing mgt/toileting, standing at sink got hygiene.  OT provided sup - min guard assist for pt to walk to RN station as pt requested to go to desk to get newspaper. OT will continue to follow acutely to maximize level of function and safety  Follow Up Recommendations  No OT follow up    Equipment Recommendations  3 in 1 bedside commode;Wheelchair (measurements OT);Wheelchair cushion (measurements OT)    Recommendations for Other Services      Precautions / Restrictions Precautions Precautions: Fall Precaution Comments: L BKA Restrictions Weight Bearing Restrictions: No LLE Weight Bearing: Non weight bearing       Mobility Bed Mobility Overal bed mobility: Modified Independent Bed Mobility: Supine to Sit;Sit to Supine     Supine to sit: Modified independent (Device/Increase time);HOB elevated          Transfers Overall transfer level: Needs assistance Equipment used: Rolling walker (2 wheeled) Transfers: Sit to/from [employer] Sit to Stand: Supervision Stand pivot transfers: Supervision Squat pivot transfers: Supervision     General transfer comment: squat pivot transfer to [facility], ambulated to bathroom with RW to transfer to toilet sup with cues to use grab bar    Balance Overall  balance assessment: Needs assistance Sitting-balance support: No upper extremity supported;Feet supported Sitting balance-Leahy Scale: Good     Standing balance support: Bilateral upper extremity supported;Single extremity supported Standing balance-Leahy Scale: Poor                             ADL either performed or assessed with clinical judgement   ADL Overall ADL's : Needs assistance/impaired     Grooming: Supervision/safety;Standing;Wash/dry hands;Wash/dry face       Lower Body Bathing: Min guard;Sitting/lateral leans       Lower Body Dressing: Min guard;Sitting/lateral leans   Toilet Transfer: Supervision/safety;Ambulation;RW;BSC;Grab bars;Regular Social worker and Hygiene: Min guard;Sit to/from stand   Tub/ Shower Transfer: Min guard;Supervision/safety;Ambulation;Rolling walker;Cueing for safety   Functional mobility during ADLs: Supervision/safety;Min guard;Cueing for safety;Rolling walker       Vision Patient Visual Report: No change from baseline     Perception     Praxis      Cognition Arousal/Alertness: Awake/alert Behavior During Therapy: WFL for tasks assessed/performed Overall Cognitive Status: No family/caregiver present to determine baseline cognitive functioning Area of Impairment: Safety/judgement                         Safety/Judgement: Decreased awareness of safety   Problem Solving: Requires verbal cues          Exercises     Shoulder Instructions       General Comments      Pertinent Vitals/ Pain       Pain  Assessment: Faces Faces Pain Scale: Hurts little more Pain Location: L LE Pain Descriptors / Indicators: Sore;Discomfort Pain Intervention(s): Monitored during session;Repositioned  Home Living                                          Prior Functioning/Environment              Frequency  Min 2X/week        Progress Toward Goals  OT  Goals(current goals can now be found in the care plan section)  Progress towards OT goals: Progressing toward goals     Plan Discharge plan needs to be updated    Co-evaluation                 AM-PAC OT "6 Clicks" Daily Activity     Outcome Measure   Help from another person eating meals?: None Help from another person taking care of personal grooming?: A Little Help from another person toileting, which includes using toliet, bedpan, or urinal?: A Little Help from another person bathing (including washing, rinsing, drying)?: A Little Help from another person to put on and taking off regular upper body clothing?: None Help from another person to put on and taking off regular lower body clothing?: A Little 6 Click Score: 20    End of Session Equipment Utilized During Treatment: Gait belt;Rolling walker  OT Visit Diagnosis: Pain;Muscle weakness (generalized) (M62.81);Other abnormalities of gait and mobility (R26.89) Pain - Right/Left: Left Pain - part of body: Leg   Activity Tolerance Patient tolerated treatment well   Patient Left in bed;with call bell/phone within reach;Other (comment) (sitting EOB)   Nurse Communication Mobility status        Time: 7262-0355 OT Time Calculation (min): 25 min  Charges: OT General Charges $OT Visit: 1 Visit OT Treatments $Self Care/Home Management : 8-22 mins $Therapeutic Activity: 8-22 mins     Galen Manila 08/21/2020, 2:02 PM

## 2020-08-21 NOTE — Progress Notes (Signed)
PROGRESS NOTE    Jake Nguyen  [name]:[id number] DOB: [birth year] DOA: 07/31/2020 PCP: Patient, No Pcp Per (Inactive)    Brief Narrative:  Jake Nguyen is a 55 year old male with past medical history significant for EtOH abuse, left foot gangrene who presented to [facility], ED with worsening left foot pain.  Patient developed left foot gangrene about 1 month ago, evaluated at [facility] with plan for vascular surgery evaluation; however patient left AMA.  He reports being wheelchair-bound for past 2 months because of an bearable left foot pain.  In the ED, his left foot was noted to be with gangrenous changes, was tachycardic without fever.  Lactic acid within normal limits.  WBC within normal limits.  Cova-19 PCR/influenza A/B PCR negative.  Left foot x-ray with progression of soft tissue gas compatible with infection, negative for osteomyelitis.  Vascular surgery consulted.  Hospital service consulted for further evaluation and management.   Assessment & Plan:   Active Problems:   Alcohol abuse with alcohol-induced mood disorder (HCC)   Gangrene of foot (HCC)   Gangrene (HCC)   S/P BKA (below knee amputation) unilateral, left (HCC)   Neuropathic pain, leg, left   Left foot gangrene, severe s/p BKA Patient presenting with progressive/severe left foot pain.  Diagnosed with gangrene left foot roughly 1 month ago.  Has been wheelchair-bound for roughly 2 months.  X-ray left foot 07/31/2020 with progression of soft tissue gas compatible with infection, negative for osteomyelitis.  ABI completed with evidence of severe PAD affecting left lower extremity.  Underwent abdominal aortogram by vascular surgery, Dr. Myra Gianotti on 08/04/2020 with findings of patent superficial femoral artery down to adductor canal where it occludes with reconstitution of the peroneal artery which is patent down to the ankle where it collateralizes well posterior tibial artery.  Underwent BKA by vascular surgery,  Dr. Myra Gianotti on 08/05/2020. --MS Contin 30 mg p.o. every 12 hours --Oxycodone 10 mg p.o. every 4 hours as needed moderate breakthrough pain --Baclofen 5 mg p.o. 3 times daily --Lyrica 150 mg p.o. 3 times daily --Outpatient follow-up with vascular surgery 4 weeks for staple removal (5/27) --Social work for placement; difficulty given homeless, no insurance, lack of family support  Peripheral artery disease Significant peripheral vascular disease noted on ABI and abdominal aortogram.  LDL 51 07/31/2020. --Atorvastatin 40 mg p.o. daily --Aspirin 325 mg p.o. daily  Anxiety: Insomnia:  --Seroquel 5 mg p.o. nightly as needed  Hypovolemic hyponatremia: Resolved Patient presented with a sodium 132, improved with IV fluid hydration.  Hypomagnesemia Repleted during the hospitalization  Hypokalemia: Resolved Repleted during hospitalization  Chronic blood loss anemia in the setting of left foot gangrene Baseline hemoglobin around 12.  No overt bleeding. --Hgb 10.5>>8.5>8.1>7.8>7.2>>7.7 --s/p IV iron --continue ferrous sulfate 325 mg p.o. twice daily --Transfuse for hemoglobin less than 7.0 or active bleeding  EtOH use disorder Counseled on need for complete cessation.  Completed Valium taper, no signs of significant EtOH withdrawal during hospitalization. --Multivitamin, thiamine, folic acid --Outpatient substance abuse treatment  Tobacco use disorder Counseled on need for complete cessation.   --Nicotine patch  Ambulatory dysfunction Patient was severely deconditioned prior to admission as he was wheelchair-bound for 2 months.  Now new BKA. --TOC for placement --continue PT/OT efforts while inpatient   DVT prophylaxis: Lovenox   Code Status: Full Code Family Communication: No family present at bedside this morning  Disposition Plan:  Level of care: Med-Surg Status is: Inpatient  Remains inpatient appropriate because:Ongoing diagnostic testing needed  not appropriate for  outpatient work up, Unsafe d/c plan, IV treatments appropriate due to intensity of illness or inability to take PO and Inpatient level of care appropriate due to severity of illness   Dispo:  Patient From: Home  Planned Disposition: Home  Medically stable for discharge: Yes  Difficult to place patient: Yes   Consultants:   Vascular Surgery  Procedures:   Adominal aortogram, 08/04/2020, Dr. Myra Gianotti  Left BKA, 08/05/2020, Dr. Myra Gianotti  Antimicrobials:   Vancomycin   Cefepime 4/24 - 4/28  Zosyn 4/22 - 4/28    Subjective: Patient seen and examined bedside, resting comfortably.  Continues with intermittent pain to left BKA site.  No family present.  No other questions or concerns at this time.  Denies headache, no chest pain, palpitations, no shortness of breath, no abdominal pain.  No acute events overnight per nursing staff.  Difficult placement, TOC working.  Objective: Vitals:   08/20/20 1942 08/20/20 2335 08/21/20 0356 08/21/20 0852  BP: 109/75 105/72 (!) 96/59 96/68  Pulse: 98 (!) 106 100 (!) 108  Resp: 18 16 16 18   Temp: 97.9 F (36.6 C) 98.5 F (36.9 C) 98.3 F (36.8 C) 98.5 F (36.9 C)  TempSrc: Oral Oral Oral Oral  SpO2: 98% 94% 96% 92%  Weight:      Height:        Intake/Output Summary (Last 24 hours) at 08/21/2020 1015 Last data filed at 08/21/2020 0854 Gross per 24 hour  Intake 600 ml  Output 1800 ml  Net -1200 ml   Filed Weights   07/31/20 1300 07/31/20 2135  Weight: 68.9 kg 67.3 kg    Examination:  General exam: Appears calm and comfortable, appears older than stated age Respiratory system: Clear to auscultation. Respiratory effort normal.  On room air Cardiovascular system: S1 & S2 heard, RRR. No JVD, murmurs, rubs, gallops or clicks. No pedal edema. Gastrointestinal system: Abdomen is nondistended, soft and nontender. No organomegaly or masses felt. Normal bowel sounds heard. Central nervous system: Alert and oriented. No focal neurological  deficits. Extremities: Moves all extremities independently, muscle strength globally intact, noted left BKA site with dressing/Ace wrap in place Skin: Left lower extremity with surgical dressing/Ace wrap in place, clean/dry/intact Psychiatry: Judgement and insight appear poor. Mood & affect appropriate.    Data Reviewed: I have personally reviewed following labs and imaging studies  CBC: No results for input(s): WBC, NEUTROABS, HGB, HCT, MCV, PLT in the last 168 hours. Basic Metabolic Panel: No results for input(s): NA, K, CL, CO2, GLUCOSE, BUN, CREATININE, CALCIUM, MG, PHOS in the last 168 hours. GFR: Estimated Creatinine Clearance: 101.7 mL/min (A) (by C-G formula based on SCr of 0.6 mg/dL (L)). Liver Function Tests: No results for input(s): AST, ALT, ALKPHOS, BILITOT, PROT, ALBUMIN in the last 168 hours. No results for input(s): LIPASE, AMYLASE in the last 168 hours. No results for input(s): AMMONIA in the last 168 hours. Coagulation Profile: No results for input(s): INR, PROTIME in the last 168 hours. Cardiac Enzymes: No results for input(s): CKTOTAL, CKMB, CKMBINDEX, TROPONINI in the last 168 hours. BNP (last 3 results) No results for input(s): PROBNP in the last 8760 hours. HbA1C: No results for input(s): HGBA1C in the last 72 hours. CBG: No results for input(s): GLUCAP in the last 168 hours. Lipid Profile: No results for input(s): CHOL, HDL, LDLCALC, TRIG, CHOLHDL, LDLDIRECT in the last 72 hours. Thyroid Function Tests: No results for input(s): TSH, T4TOTAL, FREET4, T3FREE, THYROIDAB in the last 72 hours. Anemia  Panel: No results for input(s): VITAMINB12, FOLATE, FERRITIN, TIBC, IRON, RETICCTPCT in the last 72 hours. Sepsis Labs: No results for input(s): PROCALCITON, LATICACIDVEN in the last 168 hours.  No results found for this or any previous visit (from the past 240 hour(s)).       Radiology Studies: No results found.      Scheduled Meds: . aspirin EC  325  mg Oral Daily  . atorvastatin  40 mg Oral Daily  . baclofen  5 mg Oral TID  . enoxaparin (LOVENOX) injection  40 mg Subcutaneous Q24H  . ferrous sulfate  325 mg Oral BID WC  . folic acid  1 mg Oral Daily  . morphine  30 mg Oral Q12H  . multivitamin with minerals  1 tablet Oral Daily  . nicotine  21 mg Transdermal Daily  . pregabalin  150 mg Oral TID  . senna-docusate  2 tablet Oral BID  . sodium chloride flush  3 mL Intravenous Q12H  . thiamine  100 mg Oral Daily   Or  . thiamine  100 mg Intravenous Daily   Continuous Infusions: . sodium chloride 250 mL (08/05/20 2120)     LOS: 21 days    Time spent: 31 minutes spent on chart review, discussion with nursing staff, consultants, updating family and interview/physical exam; more than 50% of that time was spent in counseling and/or coordination of care.    Alvira Philips [place], DO [facility] Available via Epic secure chat 7am-7pm After these hours, please refer to coverage provider listed on [email or web address] 08/21/2020, 10:15 AM

## 2020-08-21 NOTE — Progress Notes (Signed)
Mobility Specialist: Progress Note   08/21/20 1639  Mobility  Activity Ambulated in hall  Level of Assistance Independent after set-up  Assistive Device Wheelchair  Distance Ambulated (ft) 1000 ft  Mobility Ambulated with assistance in hallway  Mobility Response Tolerated well  Mobility performed by Mobility specialist  $Mobility charge 1 Mobility   Pt performed w/c mobility in the hallway. Pt assisted in going outside for a few minutes per order, RN aware. Pt back to bed after returning to room.   [facility] Fatuma Dowers Mobility Specialist Mobility Specialist Phone: [phone]

## 2020-08-21 NOTE — Progress Notes (Addendum)
1pm: CSW was notified by Elie Confer of [place] that the referral has been sent to the [facility] to initiate the disability application process.  9:45am: CSW and RN CM met with patient at bedside to discuss discharge planning efforts including attempts to find temporary housing. Patient agreeable to relocate outside of [place] if necessary. Patient states he does not have any friends or family that would be willing to assist him. Patient is agreeable for any assistance TOC is able to provide. Patient confirms he does not have any income at this time.  Madilyn Fireman, MSW, LCSW Transitions of Care  Clinical Social Worker II [phone]

## 2020-08-22 DIAGNOSIS — M792 Neuralgia and neuritis, unspecified: Secondary | ICD-10-CM

## 2020-08-22 DIAGNOSIS — Z89512 Acquired absence of left leg below knee: Secondary | ICD-10-CM

## 2020-08-22 NOTE — Progress Notes (Signed)
PROGRESS NOTE    [name]  Nguyen:[id number] DOB: [birth year] DOA: 07/31/2020 PCP: Patient, No Pcp Per (Inactive)    Brief Narrative:  Jake Nguyen is a 55 year old male with past medical history significant for EtOH abuse, left foot gangrene who presented to [facility], ED with worsening left foot pain.  Patient developed left foot gangrene about 1 month ago, evaluated at [facility] with plan for vascular surgery evaluation; however patient left AMA.  He reports being wheelchair-bound for past 2 months because of an bearable left foot pain.  In the ED, his left foot was noted to be with gangrenous changes, was tachycardic without fever.  Lactic acid within normal limits.  WBC within normal limits.  Cova-19 PCR/influenza A/B PCR negative.  Left foot x-ray with progression of soft tissue gas compatible with infection, negative for osteomyelitis.  Vascular surgery consulted.  Hospital service consulted for further evaluation and management.   Assessment & Plan:   Active Problems:   Alcohol abuse with alcohol-induced mood disorder (HCC)   Gangrene of foot (HCC)   Gangrene (HCC)   S/P BKA (below knee amputation) unilateral, left (HCC)   Neuropathic pain, leg, left   Left foot gangrene, severe s/p BKA Patient presenting with progressive/severe left foot pain.  Diagnosed with gangrene left foot roughly 1 month ago.  Has been wheelchair-bound for roughly 2 months.  X-ray left foot 07/31/2020 with progression of soft tissue gas compatible with infection, negative for osteomyelitis.  ABI completed with evidence of severe PAD affecting left lower extremity.  Underwent abdominal aortogram by vascular surgery, Dr. Myra Gianotti on 08/04/2020 with findings of patent superficial femoral artery down to adductor canal where it occludes with reconstitution of the peroneal artery which is patent down to the ankle where it collateralizes well posterior tibial artery.  Underwent BKA by vascular surgery,  Dr. Myra Gianotti on 08/05/2020. --MS Contin 30 mg p.o. every 12 hours --Oxycodone 10 mg p.o. every 4 hours as needed moderate breakthrough pain --Baclofen 5 mg p.o. 3 times daily --Lyrica 150 mg p.o. 3 times daily --Outpatient follow-up with vascular surgery 4 weeks for staple removal (5/27) --Social work for placement; difficulty given homeless, no insurance, lack of family support  Peripheral artery disease Significant peripheral vascular disease noted on ABI and abdominal aortogram.  LDL 51 07/31/2020. --Atorvastatin 40 mg p.o. daily --Aspirin 325 mg p.o. daily  Anxiety: Insomnia:  --Seroquel 5 mg p.o. nightly as needed  Hypovolemic hyponatremia: Resolved Patient presented with a sodium 132, improved with IV fluid hydration.  Hypomagnesemia Repleted during the hospitalization  Hypokalemia: Resolved Repleted during hospitalization  Chronic blood loss anemia in the setting of left foot gangrene Baseline hemoglobin around 12.  No overt bleeding. --Hgb 10.5>>8.5>8.1>7.8>7.2>>7.7 --s/p IV iron --continue ferrous sulfate 325 mg p.o. twice daily --Transfuse for hemoglobin less than 7.0 or active bleeding  EtOH use disorder Counseled on need for complete cessation.  Completed Valium taper, no signs of significant EtOH withdrawal during hospitalization. --Multivitamin, thiamine, folic acid --Outpatient substance abuse treatment  Tobacco use disorder Counseled on need for complete cessation.   --Nicotine patch  Ambulatory dysfunction Patient was severely deconditioned prior to admission as he was wheelchair-bound for 2 months.  Now new BKA. --TOC for placement --continue PT/OT efforts while inpatient   DVT prophylaxis: Lovenox   Code Status: Full Code Family Communication: No family present at bedside this morning  Disposition Plan:  Level of care: Med-Surg Status is: Inpatient  Remains inpatient appropriate because:Ongoing diagnostic testing needed  not appropriate for  outpatient work up, Unsafe d/c plan, IV treatments appropriate due to intensity of illness or inability to take PO and Inpatient level of care appropriate due to severity of illness   Dispo:  Patient From: Home  Planned Disposition: Home  Medically stable for discharge: Yes  Difficult to place patient: Yes   Consultants:   Vascular Surgery  Procedures:   Adominal aortogram, 08/04/2020, Dr. Myra Gianotti  Left BKA, 08/05/2020, Dr. Myra Gianotti  Antimicrobials:   Vancomycin   Cefepime 4/24 - 4/28  Zosyn 4/22 - 4/28    Subjective: Patient seen and examined bedside, resting comfortably.  Eating breakfast.  Complained of continued intermittent pain to left BKA site.  Apparently, dressing came off overnight with some mild blood dressing per patient, now rewrapped.  No family present.  No other questions or concerns at this time.  Denies headache, no chest pain, palpitations, no shortness of breath, no abdominal pain.  No acute events overnight per nursing staff.  Difficult placement, TOC working.  Objective: Vitals:   08/21/20 1925 08/22/20 0003 08/22/20 0505 08/22/20 0700  BP: 111/83 105/68 99/67 98/63   Pulse: 79 98 95 99  Resp: 18 17 18 18   Temp:  98.2 F (36.8 C) 98.3 F (36.8 C) 98.4 F (36.9 C)  TempSrc:  Oral Oral Oral  SpO2: 96% 100% 100% 98%  Weight:      Height:        Intake/Output Summary (Last 24 hours) at 08/22/2020 0924 Last data filed at 08/22/2020 0300 Gross per 24 hour  Intake 480 ml  Output 1300 ml  Net -820 ml   Filed Weights   07/31/20 1300 07/31/20 2135  Weight: 68.9 kg 67.3 kg    Examination:  General exam: Appears calm and comfortable, appears older than stated age Respiratory system: Clear to auscultation. Respiratory effort normal.  On room air Cardiovascular system: S1 & S2 heard, RRR. No JVD, murmurs, rubs, gallops or clicks. No pedal edema. Gastrointestinal system: Abdomen is nondistended, soft and nontender. No organomegaly or masses felt.  Normal bowel sounds heard. Central nervous system: Alert and oriented. No focal neurological deficits. Extremities: Moves all extremities independently, muscle strength globally intact, noted left BKA site with dressing/Ace wrap in place, clean/dry/intact Skin: Left lower extremity with surgical dressing/Ace wrap in place, clean/dry/intact Psychiatry: Judgement and insight appear poor. Mood & affect appropriate.    Data Reviewed: I have personally reviewed following labs and imaging studies  CBC: No results for input(s): WBC, NEUTROABS, HGB, HCT, MCV, PLT in the last 168 hours. Basic Metabolic Panel: No results for input(s): NA, K, CL, CO2, GLUCOSE, BUN, CREATININE, CALCIUM, MG, PHOS in the last 168 hours. GFR: Estimated Creatinine Clearance: 101.7 mL/min (A) (by C-G formula based on SCr of 0.6 mg/dL (L)). Liver Function Tests: No results for input(s): AST, ALT, ALKPHOS, BILITOT, PROT, ALBUMIN in the last 168 hours. No results for input(s): LIPASE, AMYLASE in the last 168 hours. No results for input(s): AMMONIA in the last 168 hours. Coagulation Profile: No results for input(s): INR, PROTIME in the last 168 hours. Cardiac Enzymes: No results for input(s): CKTOTAL, CKMB, CKMBINDEX, TROPONINI in the last 168 hours. BNP (last 3 results) No results for input(s): PROBNP in the last 8760 hours. HbA1C: No results for input(s): HGBA1C in the last 72 hours. CBG: No results for input(s): GLUCAP in the last 168 hours. Lipid Profile: No results for input(s): CHOL, HDL, LDLCALC, TRIG, CHOLHDL, LDLDIRECT in the last 72 hours. Thyroid Function Tests: No  results for input(s): TSH, T4TOTAL, FREET4, T3FREE, THYROIDAB in the last 72 hours. Anemia Panel: No results for input(s): VITAMINB12, FOLATE, FERRITIN, TIBC, IRON, RETICCTPCT in the last 72 hours. Sepsis Labs: No results for input(s): PROCALCITON, LATICACIDVEN in the last 168 hours.  No results found for this or any previous visit (from the  past 240 hour(s)).       Radiology Studies: No results found.      Scheduled Meds: . aspirin EC  325 mg Oral Daily  . atorvastatin  40 mg Oral Daily  . baclofen  5 mg Oral TID  . enoxaparin (LOVENOX) injection  40 mg Subcutaneous Q24H  . ferrous sulfate  325 mg Oral BID WC  . folic acid  1 mg Oral Daily  . morphine  30 mg Oral Q12H  . multivitamin with minerals  1 tablet Oral Daily  . nicotine  21 mg Transdermal Daily  . pregabalin  150 mg Oral TID  . senna-docusate  2 tablet Oral BID  . sodium chloride flush  3 mL Intravenous Q12H  . thiamine  100 mg Oral Daily   Or  . thiamine  100 mg Intravenous Daily   Continuous Infusions: . sodium chloride 250 mL (08/05/20 2120)     LOS: 22 days    Time spent: 32 minutes spent on chart review, discussion with nursing staff, consultants, updating family and interview/physical exam; more than 50% of that time was spent in counseling and/or coordination of care.    Alvira Philips [place], DO [facility] Available via Epic secure chat 7am-7pm After these hours, please refer to coverage provider listed on [email or web address] 08/22/2020, 9:24 AM

## 2020-08-22 NOTE — Progress Notes (Signed)
Mobility Specialist - Progress Note   08/22/20 1700  Mobility  Activity Ambulated in hall  Level of Assistance Independent after set-up  Assistive Device Wheelchair  Distance Ambulated (ft) 470 ft  Mobility Ambulated with assistance in hallway  Mobility Response Tolerated well  Mobility performed by Mobility specialist  $Mobility charge 1 Mobility   Pt asx throughout ambulation. Pt sitting up on edge of bed after walk, call bell at side. VSS throughout.  Mamie Levers Mobility Specialist Mobility Specialist Phone: [phone]

## 2020-08-23 MED ORDER — LORAZEPAM 2 MG/ML IJ SOLN
1.0000 mg | INTRAMUSCULAR | Status: AC
  Administered 2020-08-23: 1 mg via INTRAVENOUS
  Filled 2020-08-23: qty 1

## 2020-08-23 NOTE — Progress Notes (Signed)
Mobility Specialist - Progress Note   08/23/20 1457  Mobility  Activity Ambulated in hall  Level of Assistance Standby assist, set-up cues, supervision of patient - no hands on  Assistive Device Wheelchair  Distance Ambulated (ft) 1500 ft  Mobility Ambulated with assistance in hallway  Mobility Response Tolerated well  Mobility performed by Mobility specialist  $Mobility charge 1 Mobility   Pt asx throughout ambulation. Pt back in bed after walk, call bell at side.   Mamie Levers Mobility Specialist Mobility Specialist Phone: [phone]

## 2020-08-23 NOTE — Progress Notes (Signed)
PROGRESS NOTE    Jake Nguyen  AOZ:[id number] DOB: [birth year] DOA: 07/31/2020 PCP: Patient, No Pcp Per (Inactive)    Brief Narrative:  Jake Nguyen is a 55 year old male with past medical history significant for EtOH abuse, left foot gangrene who presented to [facility], ED with worsening left foot pain.  Patient developed left foot gangrene about 1 month ago, evaluated at [facility] with plan for vascular surgery evaluation; however patient left AMA.  He reports being wheelchair-bound for past 2 months because of an bearable left foot pain.  In the ED, his left foot was noted to be with gangrenous changes, was tachycardic without fever.  Lactic acid within normal limits.  WBC within normal limits.  Cova-19 PCR/influenza A/B PCR negative.  Left foot x-ray with progression of soft tissue gas compatible with infection, negative for osteomyelitis.  Vascular surgery consulted.  Hospital service consulted for further evaluation and management.   Assessment & Plan:   Active Problems:   Alcohol abuse with alcohol-induced mood disorder (HCC)   Gangrene of foot (HCC)   Gangrene (HCC)   S/P BKA (below knee amputation) unilateral, left (HCC)   Neuropathic pain, leg, left   Left foot gangrene, severe s/p BKA Patient presenting with progressive/severe left foot pain.  Diagnosed with gangrene left foot roughly 1 month ago.  Has been wheelchair-bound for roughly 2 months.  X-ray left foot 07/31/2020 with progression of soft tissue gas compatible with infection, negative for osteomyelitis.  ABI completed with evidence of severe PAD affecting left lower extremity.  Underwent abdominal aortogram by vascular surgery, Dr. Myra Gianotti on 08/04/2020 with findings of patent superficial femoral artery down to adductor canal where it occludes with reconstitution of the peroneal artery which is patent down to the ankle where it collateralizes well posterior tibial artery.  Underwent BKA by vascular surgery,  Dr. Myra Gianotti on 08/05/2020. --MS Contin 30 mg p.o. every 12 hours --Oxycodone 10 mg p.o. every 4 hours as needed moderate breakthrough pain --Baclofen 5 mg p.o. 3 times daily --Lyrica 150 mg p.o. 3 times daily --Outpatient follow-up with vascular surgery 4 weeks for staple removal (5/27) --Social work for placement; difficulty given homeless, no insurance, lack of family support  Peripheral artery disease Significant peripheral vascular disease noted on ABI and abdominal aortogram.  LDL 51 07/31/2020. --Atorvastatin 40 mg p.o. daily --Aspirin 325 mg p.o. daily  Anxiety: Insomnia:  --Seroquel 25 mg p.o. nightly as needed  Hypovolemic hyponatremia: Resolved Patient presented with a sodium 132, improved with IV fluid hydration.  Hypomagnesemia Repleted during the hospitalization  Hypokalemia: Resolved Repleted during hospitalization  Chronic blood loss anemia in the setting of left foot gangrene Baseline hemoglobin around 12.  No overt bleeding. --Hgb 10.5>>8.5>8.1>7.8>7.2>>7.7 --s/p IV iron --continue ferrous sulfate 325 mg p.o. twice daily --Transfuse for hemoglobin less than 7.0 or active bleeding  EtOH use disorder Counseled on need for complete cessation.  Completed Valium taper, no signs of significant EtOH withdrawal during hospitalization. --Multivitamin, thiamine, folic acid --Outpatient substance abuse treatment  Tobacco use disorder Counseled on need for complete cessation.   --Nicotine patch  Ambulatory dysfunction Patient was severely deconditioned prior to admission as he was wheelchair-bound for 2 months.  Now new BKA. --TOC for placement --continue PT/OT efforts while inpatient   DVT prophylaxis: Lovenox   Code Status: Full Code Family Communication: No family present at bedside this morning  Disposition Plan:  Level of care: Med-Surg Status is: Inpatient  Remains inpatient appropriate because:Ongoing diagnostic testing needed  not appropriate for  outpatient work up, Unsafe d/c plan, IV treatments appropriate due to intensity of illness or inability to take PO and Inpatient level of care appropriate due to severity of illness   Dispo:  Patient From:    Planned Disposition: Home  Medically stable for discharge:        Consultants:   Vascular Surgery  Procedures:   Adominal aortogram, 08/04/2020, Dr. Myra Gianotti  Left BKA, 08/05/2020, Dr. Myra Gianotti  Antimicrobials:   Vancomycin   Cefepime 4/24 - 4/28  Zosyn 4/22 - 4/28    Subjective: Patient seen and examined bedside, resting comfortably.  Requesting pain medication.  No other complaints. No family present.  No other questions or concerns at this time.  Denies headache, no chest pain, palpitations, no shortness of breath, no abdominal pain.  No acute events overnight per nursing staff.  Difficult placement, TOC working.  Objective: Vitals:   08/22/20 1935 08/23/20 0030 08/23/20 0427 08/23/20 0821  BP: 101/75 98/62 94/64  102/89  Pulse: 65 68 92 95  Resp:  18 18 15   Temp: 98.2 F (36.8 C) 98.4 F (36.9 C) 98.6 F (37 C) 97.7 F (36.5 C)  TempSrc: Oral Oral Oral Oral  SpO2: 97% 97% 94% 92%  Weight:      Height:        Intake/Output Summary (Last 24 hours) at 08/23/2020 1035 Last data filed at 08/23/2020 08/25/2020 Gross per 24 hour  Intake --  Output 2100 ml  Net -2100 ml   Filed Weights   07/31/20 1300 07/31/20 2135  Weight: 68.9 kg 67.3 kg    Examination:  General exam: Appears calm and comfortable, appears older than stated age Respiratory system: Clear to auscultation. Respiratory effort normal.  On room air Cardiovascular system: S1 & S2 heard, RRR. No JVD, murmurs, rubs, gallops or clicks. No pedal edema. Gastrointestinal system: Abdomen is nondistended, soft and nontender. No organomegaly or masses felt. Normal bowel sounds heard. Central nervous system: Alert and oriented. No focal neurological deficits. Extremities: Moves all extremities independently,  muscle strength globally intact, noted left BKA site with dressing/Ace wrap in place, clean/dry/intact Skin: Left lower extremity with surgical dressing/Ace wrap in place, clean/dry/intact Psychiatry: Judgement and insight appear poor. Mood & affect appropriate.    Data Reviewed: I have personally reviewed following labs and imaging studies  CBC: No results for input(s): WBC, NEUTROABS, HGB, HCT, MCV, PLT in the last 168 hours. Basic Metabolic Panel: No results for input(s): NA, K, CL, CO2, GLUCOSE, BUN, CREATININE, CALCIUM, MG, PHOS in the last 168 hours. GFR: Estimated Creatinine Clearance: 101.7 mL/min (A) (by C-G formula based on SCr of 0.6 mg/dL (L)). Liver Function Tests: No results for input(s): AST, ALT, ALKPHOS, BILITOT, PROT, ALBUMIN in the last 168 hours. No results for input(s): LIPASE, AMYLASE in the last 168 hours. No results for input(s): AMMONIA in the last 168 hours. Coagulation Profile: No results for input(s): INR, PROTIME in the last 168 hours. Cardiac Enzymes: No results for input(s): CKTOTAL, CKMB, CKMBINDEX, TROPONINI in the last 168 hours. BNP (last 3 results) No results for input(s): PROBNP in the last 8760 hours. HbA1C: No results for input(s): HGBA1C in the last 72 hours. CBG: No results for input(s): GLUCAP in the last 168 hours. Lipid Profile: No results for input(s): CHOL, HDL, LDLCALC, TRIG, CHOLHDL, LDLDIRECT in the last 72 hours. Thyroid Function Tests: No results for input(s): TSH, T4TOTAL, FREET4, T3FREE, THYROIDAB in the last 72 hours. Anemia Panel: No results for input(s): VITAMINB12,  FOLATE, FERRITIN, TIBC, IRON, RETICCTPCT in the last 72 hours. Sepsis Labs: No results for input(s): PROCALCITON, LATICACIDVEN in the last 168 hours.  No results found for this or any previous visit (from the past 240 hour(s)).       Radiology Studies: No results found.      Scheduled Meds: . aspirin EC  325 mg Oral Daily  . atorvastatin  40 mg Oral  Daily  . baclofen  5 mg Oral TID  . enoxaparin (LOVENOX) injection  40 mg Subcutaneous Q24H  . ferrous sulfate  325 mg Oral BID WC  . folic acid  1 mg Oral Daily  . morphine  30 mg Oral Q12H  . multivitamin with minerals  1 tablet Oral Daily  . nicotine  21 mg Transdermal Daily  . pregabalin  150 mg Oral TID  . senna-docusate  2 tablet Oral BID  . sodium chloride flush  3 mL Intravenous Q12H  . thiamine  100 mg Oral Daily   Or  . thiamine  100 mg Intravenous Daily   Continuous Infusions: . sodium chloride 250 mL (08/05/20 2120)     LOS: 23 days    Time spent: 30 minutes spent on chart review, discussion with nursing staff, consultants, updating family and interview/physical exam; more than 50% of that time was spent in counseling and/or coordination of care.    Alvira Philips [place], DO [facility] Available via Epic secure chat 7am-7pm After these hours, please refer to coverage provider listed on [email or web address] 08/23/2020, 10:35 AM

## 2020-08-24 LAB — CBC
HCT: 29.3 % — ABNORMAL LOW (ref 39.0–52.0)
Hemoglobin: 9 g/dL — ABNORMAL LOW (ref 13.0–17.0)
MCH: 25.8 pg — ABNORMAL LOW (ref 26.0–34.0)
MCHC: 30.7 g/dL (ref 30.0–36.0)
MCV: 84 fL (ref 80.0–100.0)
Platelets: 420 10*3/uL — ABNORMAL HIGH (ref 150–400)
RBC: 3.49 MIL/uL — ABNORMAL LOW (ref 4.22–5.81)
RDW: 15.9 % — ABNORMAL HIGH (ref 11.5–15.5)
WBC: 9.1 10*3/uL (ref 4.0–10.5)
nRBC: 0 % (ref 0.0–0.2)

## 2020-08-24 LAB — BASIC METABOLIC PANEL
Anion gap: 7 (ref 5–15)
BUN: 6 mg/dL (ref 6–20)
CO2: 30 mmol/L (ref 22–32)
Calcium: 9 mg/dL (ref 8.9–10.3)
Chloride: 98 mmol/L (ref 98–111)
Creatinine, Ser: 0.54 mg/dL — ABNORMAL LOW (ref 0.61–1.24)
GFR, Estimated: >60 mL/min (ref >60)
Glucose, Bld: 110 mg/dL — ABNORMAL HIGH (ref 70–99)
Potassium: 3.8 mmol/L (ref 3.5–5.1)
Sodium: 135 mmol/L (ref 135–145)

## 2020-08-24 LAB — MAGNESIUM: Magnesium: 1.7 mg/dL (ref 1.7–2.4)

## 2020-08-24 MED ORDER — LORAZEPAM 1 MG PO TABS
1.0000 mg | ORAL_TABLET | Freq: Four times a day (QID) | ORAL | Status: DC | PRN
  Administered 2020-08-24 – 2020-08-26 (×2): 1 mg via ORAL
  Filled 2020-08-24 (×2): qty 1

## 2020-08-24 MED ORDER — HALOPERIDOL LACTATE 5 MG/ML IJ SOLN
5.0000 mg | Freq: Once | INTRAMUSCULAR | Status: AC
  Administered 2020-08-24: 5 mg via INTRAVENOUS
  Filled 2020-08-24: qty 1

## 2020-08-24 NOTE — Progress Notes (Signed)
CSW spoke with Pat Patrick at [facility] in [place] regarding a shelter bed for patient - Greggory Stallion states that due to the patient being wheelchair bound, there would need to be adjustments made at the shelter before the patient can arrive. Greggory Stallion states the agency has a Probation officer on Tuesday at 3pm and requests CSW return call on Wednesday morning.  CSW spoke with receptionist at [facility] who states the facility is not wheelchair accessible.  Edwin Dada, MSW, LCSW Transitions of Care  Clinical Social Worker II [phone]

## 2020-08-24 NOTE — Progress Notes (Addendum)
CSW spoke with patient regarding the submission of his Medicaid and Disability applications, CSW informed him those were completed and submitted by a Artist. CSW provided patient with contact information for his significant other to call with additional questions.  CSW spoke with patient's significant other Sula Soda who states she resides in [place] with her parents and cannot accept the patient into her home. CSW answered Fontella's questions regarding the Medicaid and Disability applications.  Edwin Dada, MSW, LCSW Transitions of Care  Clinical Social Worker II [phone]

## 2020-08-24 NOTE — Progress Notes (Signed)
Occupational Therapy Treatment Patient Details Name: Jake Nguyen MRN: [id number] DOB: 28-Oct-1965 Today's Date: 08/24/2020    History of present illness 55 y.o. male presents to [facility] ED on 07/31/2020 with worsening L foot pain. Pt with history of L foot gangrene for ~1 month, there were plans for vascular surgery at [facility] however pt left AMA. He had LLE angiogram 4/26.  Pt S/P left BKA 4/27. PMH includes medical history significant of alcohol abuse, left foot gangrene.   OT comments  Performed dressing with set up, toileting and standing grooming with supervision from RW. Pt stating he feels depressed about the lack of discharge disposition.   Follow Up Recommendations  No OT follow up    Equipment Recommendations  3 in 1 bedside commode;Wheelchair (measurements OT);Wheelchair cushion (measurements OT)    Recommendations for Other Services      Precautions / Restrictions Precautions Precautions: Fall Precaution Comments: L BKA Restrictions LLE Weight Bearing: Non weight bearing       Mobility Bed Mobility Overal bed mobility: Modified Independent                  Transfers Overall transfer level: Needs assistance Equipment used: Rolling walker (2 wheeled) Transfers: Sit to/from Stand Sit to Stand: Supervision         General transfer comment: for safety    Balance     Sitting balance-Leahy Scale: Good     Standing balance support: Bilateral upper extremity supported Standing balance-Leahy Scale: Poor                             ADL either performed or assessed with clinical judgement   ADL Overall ADL's : Needs assistance/impaired     Grooming: Brushing hair;Wash/dry hands;Standing;Supervision/safety           Upper Body Dressing : Set up;Sitting   Lower Body Dressing: Set up;Sitting/lateral leans   Toilet Transfer: Supervision/safety;Ambulation;RW;Regular Toilet   Toileting- Architect and Hygiene:  Supervision/safety;Sit to/from stand       Functional mobility during ADLs: Supervision/safety;Rolling walker       Vision       Perception     Praxis      Cognition Arousal/Alertness: Awake/alert Behavior During Therapy: WFL for tasks assessed/performed Overall Cognitive Status: No family/caregiver present to determine baseline cognitive functioning                                 General Comments: Pt with concerns about going to [employer], reports he may have a conflict with someone in that area.        Exercises     Shoulder Instructions       General Comments      Pertinent Vitals/ Pain       Pain Assessment: Faces Faces Pain Scale: Hurts a little bit Pain Location: L LE Pain Descriptors / Indicators: Sore;Discomfort Pain Intervention(s): Monitored during session  Home Living                                          Prior Functioning/Environment              Frequency  Min 2X/week        Progress Toward Goals  OT Goals(current goals can now be  found in the care plan section)  Progress towards OT goals: Progressing toward goals  Acute Rehab OT Goals Patient Stated Goal: to have less pain and get out of the hospital soon OT Goal Formulation: With patient Time For Goal Achievement: 09/02/20 Potential to Achieve Goals: Good  Plan Discharge plan remains appropriate    Co-evaluation                 AM-PAC OT "6 Clicks" Daily Activity     Outcome Measure   Help from another person eating meals?: None Help from another person taking care of personal grooming?: A Little Help from another person toileting, which includes using toliet, bedpan, or urinal?: A Little Help from another person bathing (including washing, rinsing, drying)?: A Little Help from another person to put on and taking off regular upper body clothing?: None Help from another person to put on and taking off regular lower body  clothing?: A Little 6 Click Score: 20    End of Session Equipment Utilized During Treatment: Rolling walker;Gait belt  OT Visit Diagnosis: Pain;Muscle weakness (generalized) (M62.81);Other abnormalities of gait and mobility (R26.89)   Activity Tolerance Patient tolerated treatment well   Patient Left with call bell/phone within reach;in bed   Nurse Communication          Time: 6629-4765 OT Time Calculation (min): 15 min  Charges: OT General Charges $OT Visit: 1 Visit OT Treatments $Self Care/Home Management : 8-22 mins  Martie Round, OTR/L Acute Rehabilitation Services Pager: [phone] Office: [phone]   Evern Bio 08/24/2020, 12:48 PM

## 2020-08-24 NOTE — Progress Notes (Signed)
Mobility Specialist: Progress Note   08/24/20 1839  Mobility  Activity Ambulated in hall  Level of Assistance Contact guard assist, steadying assist  Assistive Device Front wheel walker  Distance Ambulated (ft) 180 ft  Mobility Ambulated with assistance in hallway  Mobility Response Tolerated well  Mobility performed by Mobility specialist  $Mobility charge 1 Mobility   Pt asx during ambulation. Pt back to bed with RN present in room.   [facility] Jake Nguyen Mobility Specialist Mobility Specialist Phone: [phone]

## 2020-08-24 NOTE — Progress Notes (Signed)
   08/24/20 2252  Vitals  Temp 97.6 F (36.4 C)  Temp Source Oral  BP 133/84  MAP (mmHg) 97  BP Location Right Arm  BP Method Automatic  Patient Position (if appropriate) Lying  Pulse Rate 94  Pulse Rate Source Monitor  Resp 16  MEWS COLOR  MEWS Score Color Green  Oxygen Therapy  SpO2 99 %  MEWS Score  MEWS Temp 0  MEWS Systolic 0  MEWS Pulse 0  MEWS RR 0  MEWS LOC 0  MEWS Score 0   New LBKA Patient with unwitnessed fall found on his bottom, no apparent injuries  attempting to use bedside. No family available for notification physician notified no new orders at this time. Will continue to monitor.

## 2020-08-24 NOTE — Progress Notes (Signed)
[facility] PROGRESS NOTE  Jake Nguyen ZHY:[id number] DOB: [birth year] DOA: 07/31/2020 PCP: Patient, No Pcp Per (Inactive)  Status: Remains inpatient appropriate because:Ongoing active pain requiring inpatient pain management and Unsafe d/c plan   Dispo:  Patient From:  Boarding house  Planned Disposition: Shelter vs streets  Medically stable for discharge:  yes  BARRIERS TO dc: patient currently has Medicaid pending but has no funding source to pay for medications that will not be available through the [facility] health and wellness community clinic after discharge.  Primary concern is that patient (at least for the short-term) will need up to 3 to 6 months of chronic narcotics consisting of short acting Oxy IR and MS Contin as well as baclofen and Lyrica to control severe neuropathic and phantom limb pain after left BKA.  At present [facility] health currently is only able to provide a 1 week supply of narcotics per our policy.  I have been speaking with [facility] pharmacist about options.   Level of care: Med-Surg  Code Status: Full Family Communication: Patient and significant other on 5/12 DVT prophylaxis: Lovenox Vaccination status: Unvaccinated but as of 5/2 has agreed to take the first shot of Moderna    HPI:  55 year old male with past medical history significant for EtOH abuse, left foot gangrene who presented to [facility], ED with worsening left foot pain.  Patient developed left foot gangrene about 1 month ago, evaluated at [facility] with plan for vascular surgery evaluation; however patient left AMA.  He reports being wheelchair-bound for past 2 months because of an bearable left foot pain.  Initial work-up in the ER consistent with soft tissue gangrene but no osteomyelitis.  Vascular service was consulted.  Aortogram revealed significant vascular disease and patient subsequently underwent left BKA.  Initial recommendation was for SNF placement but with adequate control of  severe phantom limb and neuropathic pain patient mobility has improved to the point that he does not qualify for short-term rehab.  As noted above, further concerning issues after discharge is patient's lack of access to necessary pain medications to ensure adequate mobility after discharge.  Also he is a new amputee and has no options for discharge except to a shelter (currently no available beds) or the streets with his brother.   Subjective: Awake.  Able to independently reposition self in bed and sit on side of bed.  Discussed with patient that we are potentially looking at a shelter in the [place] area.  Patient became extremely agitated and stated that he has multiple entities in [place] and he is concerned about discharging to a skilled nursing facility especially now that he has an amputation.  Later apologized for his agitation.  Both myself and TOC member in room expressed understanding and that we will continue to try to locate facilities outside of the [place] area.  Objective: Vitals:   08/23/20 2342 08/24/20 0401  BP: 95/63 99/70  Pulse: 100 95  Resp: 18 18  Temp: 98.5 F (36.9 C) 98.4 F (36.9 C)  SpO2: 90% 94%    Intake/Output Summary (Last 24 hours) at 08/24/2020 0818 Last data filed at 08/23/2020 2200 Gross per 24 hour  Intake 720 ml  Output 2060 ml  Net -1340 ml   Filed Weights   07/31/20 1300 07/31/20 2135  Weight: 68.9 kg 67.3 kg    Exam:  Constitutional: Awake, initially calm, no acute distress Respiratory: Clear, room air without any increased work of breathing with activity Cardiovascular: Continues to have  some mild exertional tachycardia but no resting tachycardia.  No peripheral edema.  Normal heart sounds Abdomen: LBM 5/13, soft nontender nondistended with normoactive bowel sounds.  Eating well. Musculoskeletal: Left BKA site unremarkable Neurologic: Cranial nerves are intact, no focal neurological deficits, with recent BKA ambulates with  walker and wheelchair Psychiatric: Alert and oriented x3.  Overall pleasant affect.   Assessment/Plan: Acute problems: Left foot gangrene, severe-s/p left BKA 4/27 -- Imaging this admission consistent with soft tissue gangrene and additional imaging also consistent with moderate to severe PAD --OP FU w/ vascular surgery in 4 weeks for staple removal (5/27)  Peripheral artery disease --Confirmed on imaging this admission  -- Continue atorvastatin and aspirin  Neuropathy/phantom limb pain -- Continue Lyrica, OxyContin, short acting Oxy IR for breakthrough pain and scheduled baclofen -- Anticipate patient will need a minimum of 3 and a maximum of 6 months of MS Contin and short acting Oxy IR with hopeful eventual tapering and discontinuation of narcotics in favor of NSAIDs and Tylenol.  Anticipate will need to remain on baclofen and Lyrica for an indefinite amount of time.  Mood disorder /anxiety/insomnia -- Continue Seroquel  Chronic blood loss anemia in the setting of left foot gangrene Baseline hemoglobin around 12 --Hgb 10.5 >8.5 >8.1 >7.8 >7.2 > 7.5 > 7.7 --Iron 13 with a saturation of 4-given IV iron followed by oral iron  --Transfuse for hgb </= 7.0 or if active bleeding  Polysubstance abuse: EtOH and tobacco  -- Continue multivitamin, thiamine, folic acid and nicotine patch  Physical deconditioning/ambulatory dysfunction secondary to new BKA --PT and OT recommend SNF for rehabilitative therapies -- Was deconditioned prior to admission noting was wheelchair-bound for 2 months prior to admission -Mobility significantly improved with adequate pain control -- Still trying to locate safe discharge disposition and are hopeful he can discharge with family intact    Other problems: Hypovolemic hyponatremia:  Resolved  Hypomagnesemia Resolved  Hypokalemia:  Resolved     Data Reviewed: Basic Metabolic Panel: Recent Labs  Lab 08/24/20 0158  NA 135  K 3.8  CL 98   CO2 30  GLUCOSE 110*  BUN 6  CREATININE 0.54*  CALCIUM 9.0  MG 1.7   CBC: Recent Labs  Lab 08/24/20 0158  WBC 9.1  HGB 9.0*  HCT 29.3*  MCV 84.0  PLT 420*     Studies: No results found.  Scheduled Meds: . aspirin EC  325 mg Oral Daily  . atorvastatin  40 mg Oral Daily  . baclofen  5 mg Oral TID  . enoxaparin (LOVENOX) injection  40 mg Subcutaneous Q24H  . ferrous sulfate  325 mg Oral BID WC  . folic acid  1 mg Oral Daily  . morphine  30 mg Oral Q12H  . multivitamin with minerals  1 tablet Oral Daily  . nicotine  21 mg Transdermal Daily  . pregabalin  150 mg Oral TID  . senna-docusate  2 tablet Oral BID  . sodium chloride flush  3 mL Intravenous Q12H  . thiamine  100 mg Oral Daily   Or  . thiamine  100 mg Intravenous Daily   Continuous Infusions: . sodium chloride 250 mL (08/05/20 2120)    Active Problems:   Alcohol abuse with alcohol-induced mood disorder (HCC)   Gangrene of foot (HCC)   Gangrene (HCC)   S/P BKA (below knee amputation) unilateral, left (HCC)   Neuropathic pain, leg, left   Consultants:  Psychiatry  Vascular surgery  Procedures:  Adominal aortogram, 08/04/2020,  Dr. Myra Gianotti  Left BKA, 08/05/2020, Dr. Myra Gianotti   Antibiotics:  Vancomycin 4/22>>  Cefepime 4/24 - 4/28  Zosyn 4/22 - 4/28    Time spent: 20 minutes    Junious Silk ANP  [facility] 7 am - 330 pm/M-F for direct patient care and secure chat Please refer to Amion for contact info 24  days

## 2020-08-24 NOTE — Progress Notes (Signed)
Physical Therapy Treatment Patient Details Name: Jake Nguyen MRN: [id number] DOB: [birth year] Today's Date: 08/24/2020    History of Present Illness 55 y.o. male presents to [facility] ED on 07/31/2020 with worsening L foot pain. Pt with history of L foot gangrene for ~1 month, there were plans for vascular surgery at [facility] however pt left AMA. He had LLE angiogram 4/26.  Pt S/P left BKA 4/27. PMH includes medical history significant of alcohol abuse, left foot gangrene.    PT Comments    Pt progressing towards goals. Pt able to propel himself across the Nguyen to have short time outside. Supervision for safety and required cues for management of WC parts. Supervision for safety to take hop steps to WC. Would benefit from continued management of WC on uneven terrain and gait using RW on uneven terrain to ensure safety with outdoor management. Updated goals appropriately. Current recommendations appropriate. Will continue to follow acutely.     Follow Up Recommendations  No PT follow up;Other (comment);Supervision - Intermittent (PT follow up for prosthetic training when appropriate)     Equipment Recommendations  Wheelchair (measurements PT);Wheelchair cushion (measurements PT);Rolling walker with 5" wheels Lawyer (18x16 lightweight amputee wheelchair with left amputee pad, anti tippers, desk armrests, and foot rest for right);Wheelchair cushion (18x16 pressure relieving cushion))    Recommendations for Other Services       Precautions / Restrictions Precautions Precautions: Fall Precaution Comments: L BKA Restrictions Weight Bearing Restrictions: Yes LLE Weight Bearing: Non weight bearing    Mobility  Bed Mobility Overal bed mobility: Modified Independent                  Transfers Overall transfer level: Needs assistance Equipment used: Rolling walker (2 wheeled) Transfers: Sit to/from [employer] Sit to Stand: Modified independent  (Device/Increase time)   Squat pivot transfers: Supervision     General transfer comment: required assist for [employer] part management to perform squat pivot from WC to bed at end of session.  Ambulation/Gait Ambulation/Gait assistance: Supervision Gait Distance (Feet): 5 Feet Assistive device: Rolling walker (2 wheeled) Gait Pattern/deviations: Step-to pattern Gait velocity: reduced   General Gait Details: hop to gait to take steps to [place] this session. supervision for safety.   Stairs             Merchant navy officer mobility: Yes Wheelchair propulsion: Both upper extremities Wheelchair parts: Needs assistance Distance: 2000 Wheelchair Assistance Details (indicate cue type and reason): Required assist to manage WC parts during transfers. Cues for environmental awareness. Able to propel himself accross the Nguyen in order to spend time outside.  Modified Rankin (Stroke Patients Only)       Balance Overall balance assessment: Needs assistance Sitting-balance support: No upper extremity supported;Feet supported Sitting balance-Leahy Scale: Good     Standing balance support: Bilateral upper extremity supported Standing balance-Leahy Scale: Poor                              Cognition Arousal/Alertness: Awake/alert Behavior During Therapy: WFL for tasks assessed/performed Overall Cognitive Status: No family/caregiver present to determine baseline cognitive functioning                                 General Comments: Pt very frustrated this session and stating he is wanting to get out of the Nguyen.  Exercises      General Comments        Pertinent Vitals/Pain Pain Assessment: Faces Faces Pain Scale: Hurts a little bit Pain Location: L LE Pain Descriptors / Indicators: Sore;Discomfort Pain Intervention(s): Limited activity within patient's tolerance;Monitored during session;Repositioned    Home  Living                      Prior Function            PT Goals (current goals can now be found in the care plan section) Acute Rehab PT Goals Patient Stated Goal: to have less pain and get out of the Nguyen soon PT Goal Formulation: With patient Time For Goal Achievement: 09/07/20 Potential to Achieve Goals: Good Additional Goals Additional Goal #1: Pt will perform WC mobility at a mod I level on uneven terrain for >125' to ensure independence with mobility in community. Progress towards PT goals: Progressing toward goals    Frequency    Min 3X/week      PT Plan Current plan remains appropriate    Co-evaluation              AM-PAC PT "6 Clicks" Mobility   Outcome Measure  Help needed turning from your back to your side while in a flat bed without using bedrails?: None Help needed moving from lying on your back to sitting on the side of a flat bed without using bedrails?: None Help needed moving to and from a bed to a chair (including a wheelchair)?: None Help needed standing up from a chair using your arms (e.g., wheelchair or bedside chair)?: A Little Help needed to walk in Nguyen room?: A Little Help needed climbing 3-5 steps with a railing? : A Little 6 Click Score: 21    End of Session Equipment Utilized During Treatment: Gait belt Activity Tolerance: Patient tolerated treatment well Patient left: in bed;with call bell/phone within reach Nurse Communication: Mobility status PT Visit Diagnosis: Other abnormalities of gait and mobility (R26.89);Pain;Unsteadiness on feet (R26.81);Difficulty in walking, not elsewhere classified (R26.2) Pain - Right/Left: Left Pain - part of body: Leg     Time: 1454-1536 PT Time Calculation (min) (ACUTE ONLY): 42 min  Charges:  $Therapeutic Activity: 8-22 mins $Wheel Chair Management: 23-37 mins                     Cindee Salt, DPT  Acute Rehabilitation Services  Pager: [phone] Office: [phone]    [name] 08/24/2020, 4:34 PM

## 2020-08-25 NOTE — Progress Notes (Signed)
[facility] PROGRESS NOTE  Jake Nguyen TDV:[id number] DOB: 07/22/1965 DOA: 07/31/2020 PCP: Patient, No Pcp Per (Inactive)  Status: Remains inpatient appropriate because:Ongoing active pain requiring inpatient pain management and Unsafe d/c plan   Dispo:  Patient From:  Boarding house  Planned Disposition: Shelter vs streets  Medically stable for discharge:  yes  BARRIERS TO dc: patient currently has Medicaid pending but has no funding source to pay for medications that will not be available through the [facility] health and wellness community clinic after discharge.  Primary concern is that patient (at least for the short-term) will need up to 3 to 6 months of chronic narcotics consisting of short acting Oxy IR and MS Contin as well as baclofen and Lyrica to control severe neuropathic and phantom limb pain after left BKA.  At present [facility] health currently is only able to provide a 1 week supply of narcotics per our policy.  I have been speaking with [facility] pharmacist about options.   Level of care: Med-Surg  Code Status: Full Family Communication: Patient and significant other on 5/12 DVT prophylaxis: Lovenox Vaccination status: Unvaccinated but as of 5/2 has agreed to take the first shot of Moderna    HPI:  55 year old male with past medical history significant for EtOH abuse, left foot gangrene who presented to [facility] ED with worsening left foot pain.  Patient developed left foot gangrene about 1 month ago, evaluated at [facility] with plan for vascular surgery evaluation; however patient left AMA.  He reports being wheelchair-bound for past 2 months because of an bearable left foot pain.  Initial work-up in the ER consistent with soft tissue gangrene but no osteomyelitis.  Vascular service was consulted.  Aortogram revealed significant vascular disease and patient subsequently underwent left BKA.  Initial recommendation was for SNF placement but with adequate control of  severe phantom limb and neuropathic pain patient mobility has improved to the point that he does not qualify for short-term rehab.  As noted above, further concerning issues after discharge is patient's lack of access to necessary pain medications to ensure adequate mobility after discharge.  Also he is a new amputee and has no options for discharge except to a shelter (currently no available beds) or the streets with his brother.   Subjective: Just awakened.  Question him about fall yesterday.  He stated he was trying to get up to the bathroom and his right foot slid out from under him.  Does not have any shoes available to wear.  Objective: Vitals:   08/24/20 2252 08/25/20 0300  BP: 133/84 104/67  Pulse: 94 (!) 104  Resp: 16 16  Temp: 97.6 F (36.4 C) 98.1 F (36.7 C)  SpO2: 99% 94%    Intake/Output Summary (Last 24 hours) at 08/25/2020 0753 Last data filed at 08/25/2020 0750 Gross per 24 hour  Intake 920 ml  Output 1350 ml  Net -430 ml   Filed Weights   07/31/20 1300 07/31/20 2135  Weight: 68.9 kg 67.3 kg    Exam:  Constitutional: Awake but somewhat groggy having just awakened.  No acute distress Respiratory: Lungs remain clear anteriorly, normal respiratory effort, no cough.  Room air Cardiovascular: S1-S2, no peripheral edema, regular nontachycardic pulse.  Skin warm and dry Abdomen: LBM 5/15,  Musculoskeletal: Left BKA site unremarkable Neurologic: Cranial nerves are intact, no focal neurological deficits, with recent BKA ambulates with walker and wheelchair Psychiatric: Alert and oriented X3.  Appropriate affect   Assessment/Plan: Acute problems: Left  foot gangrene, severe-s/p left BKA 4/27 -- Imaging this admission consistent with soft tissue gangrene and additional imaging also consistent with moderate to severe PAD --OP FU w/ vascular surgery in 4 weeks for staple removal (5/27)  Peripheral artery disease --Confirmed on imaging this admission  -- Continue  atorvastatin and aspirin  Neuropathy/phantom limb pain -- Continue Lyrica, OxyContin, short acting Oxy IR for breakthrough pain and scheduled baclofen -- Anticipate patient will need a minimum of 3 and a maximum of 6 months of MS Contin and short acting Oxy IR with hopeful eventual tapering and discontinuation of narcotics in favor of NSAIDs and Tylenol.  Anticipate will need to remain on baclofen and Lyrica for an indefinite amount of time.  Mood disorder /anxiety/insomnia -- Continue Seroquel  Chronic blood loss anemia in the setting of left foot gangrene Baseline hemoglobin around 12 --Hgb 10.5 >8.5 >8.1 >7.8 >7.2 > 7.5 > 7.7 --Iron 13 with a saturation of 4-given IV iron followed by oral iron  --Transfuse for hgb </= 7.0 or if active bleeding  Polysubstance abuse: EtOH and tobacco  -- Continue multivitamin, thiamine, folic acid and nicotine patch  Physical deconditioning/ambulatory dysfunction secondary to new BKA --Improvement in mobility therefore does not meet requirements for short-term rehab -Mobility significantly improved with adequate pain control -- Still trying to locate safe discharge disposition and are hopeful he can discharge with family intact    Other problems: Hypovolemic hyponatremia:  Resolved  Hypomagnesemia Resolved  Hypokalemia:  Resolved     Data Reviewed: Basic Metabolic Panel: Recent Labs  Lab 08/24/20 0158  NA 135  K 3.8  CL 98  CO2 30  GLUCOSE 110*  BUN 6  CREATININE 0.54*  CALCIUM 9.0  MG 1.7   CBC: Recent Labs  Lab 08/24/20 0158  WBC 9.1  HGB 9.0*  HCT 29.3*  MCV 84.0  PLT 420*     Studies: No results found.  Scheduled Meds: . aspirin EC  325 mg Oral Daily  . atorvastatin  40 mg Oral Daily  . baclofen  5 mg Oral TID  . enoxaparin (LOVENOX) injection  40 mg Subcutaneous Q24H  . ferrous sulfate  325 mg Oral BID WC  . folic acid  1 mg Oral Daily  . morphine  30 mg Oral Q12H  . multivitamin with minerals  1  tablet Oral Daily  . nicotine  21 mg Transdermal Daily  . pregabalin  150 mg Oral TID  . senna-docusate  2 tablet Oral BID  . sodium chloride flush  3 mL Intravenous Q12H  . thiamine  100 mg Oral Daily   Or  . thiamine  100 mg Intravenous Daily   Continuous Infusions: . sodium chloride 250 mL (08/05/20 2120)    Active Problems:   Alcohol abuse with alcohol-induced mood disorder (HCC)   Gangrene of foot (HCC)   Gangrene (HCC)   S/P BKA (below knee amputation) unilateral, left (HCC)   Neuropathic pain, leg, left   Consultants:  Psychiatry  Vascular surgery  Procedures:  Adominal aortogram, 08/04/2020, Dr. Myra Gianotti  Left BKA, 08/05/2020, Dr. Myra Gianotti   Antibiotics:  Vancomycin 4/22>>  Cefepime 4/24 - 4/28  Zosyn 4/22 - 4/28    Time spent: 20 minutes    Junious Silk ANP  [facility] 7 am - 330 pm/M-F for direct patient care and secure chat Please refer to Amion for contact info 25  days

## 2020-08-25 NOTE — Progress Notes (Signed)
Mobility Specialist: Progress Note   08/25/20 1716  Mobility  Activity Ambulated in hall  Level of Assistance Standby assist, set-up cues, supervision of patient - no hands on  Assistive Device Front wheel walker  Distance Ambulated (ft) 160 ft  Mobility Ambulated with assistance in hallway  Mobility Response Tolerated well  Mobility performed by Mobility specialist  $Mobility charge 1 Mobility   Pt asx during ambulation. Pt to bed after walk per request.  Cristal Deer Jenin Birdsall Mobility Specialist Mobility Specialist Phone: [phone]

## 2020-08-25 NOTE — Progress Notes (Addendum)
12pm: CSW added patient to waiting list at [facility] - left contact information requesting a return call.  10am: CSW spoke with Leeanne Mannan at [facility] - patient ineligible to be considered for LOG admission due to his age.   Patient does not meet requirements for SNF level of care.  TOC to continue pursuing shelter options for patient.  Edwin Dada, MSW, LCSW Transitions of Care  Clinical Social Worker II [phone]

## 2020-08-26 ENCOUNTER — Other Ambulatory Visit (HOSPITAL_COMMUNITY): Payer: Self-pay

## 2020-08-26 MED ORDER — FERROUS SULFATE 325 (65 FE) MG PO TABS
325.0000 mg | ORAL_TABLET | Freq: Two times a day (BID) | ORAL | 3 refills | Status: DC
  Filled 2020-08-26: qty 60, 30d supply, fill #0

## 2020-08-26 MED ORDER — FOLIC ACID 1 MG PO TABS
1.0000 mg | ORAL_TABLET | Freq: Every day | ORAL | 3 refills | Status: DC
  Filled 2020-08-26: qty 30, 30d supply, fill #0

## 2020-08-26 MED ORDER — QUETIAPINE FUMARATE 25 MG PO TABS
25.0000 mg | ORAL_TABLET | Freq: Every evening | ORAL | 1 refills | Status: DC | PRN
  Filled 2020-08-26: qty 30, 30d supply, fill #0

## 2020-08-26 MED ORDER — THIAMINE HCL 100 MG PO TABS
100.0000 mg | ORAL_TABLET | Freq: Every day | ORAL | 3 refills | Status: DC
  Filled 2020-08-26: qty 30, 30d supply, fill #0

## 2020-08-26 MED ORDER — ACETAMINOPHEN 325 MG PO TABS: 650.0000 mg | ORAL_TABLET | Freq: Four times a day (QID) | ORAL | Status: DC | PRN

## 2020-08-26 MED ORDER — PREGABALIN 150 MG PO CAPS
150.0000 mg | ORAL_CAPSULE | Freq: Three times a day (TID) | ORAL | 3 refills | Status: DC
  Filled 2020-08-26: qty 90, 30d supply, fill #0

## 2020-08-26 MED ORDER — OXYCODONE HCL 10 MG PO TABS
10.0000 mg | ORAL_TABLET | ORAL | 0 refills | Status: AC | PRN
  Filled 2020-08-26: qty 30, 5d supply, fill #0

## 2020-08-26 MED ORDER — MORPHINE SULFATE ER 15 MG PO TBCR
30.0000 mg | EXTENDED_RELEASE_TABLET | Freq: Two times a day (BID) | ORAL | 0 refills | Status: AC
  Filled 2020-08-26: qty 28, 7d supply, fill #0

## 2020-08-26 MED ORDER — ASPIRIN 325 MG PO TBEC
325.0000 mg | DELAYED_RELEASE_TABLET | Freq: Every day | ORAL | 0 refills | Status: DC
  Filled 2020-08-26: qty 30, 30d supply, fill #0

## 2020-08-26 MED ORDER — BACLOFEN 5 MG PO TABS
5.0000 mg | ORAL_TABLET | Freq: Three times a day (TID) | ORAL | 3 refills | Status: DC
  Filled 2020-08-26: qty 90, 30d supply, fill #0

## 2020-08-26 MED ORDER — ATORVASTATIN CALCIUM 40 MG PO TABS
40.0000 mg | ORAL_TABLET | Freq: Every day | ORAL | 3 refills | Status: DC
  Filled 2020-08-26: qty 30, 30d supply, fill #0

## 2020-08-26 MED ORDER — SENNOSIDES-DOCUSATE SODIUM 8.6-50 MG PO TABS
2.0000 | ORAL_TABLET | Freq: Two times a day (BID) | ORAL | 3 refills | Status: DC
  Filled 2020-08-26: qty 60, 15d supply, fill #0

## 2020-08-26 NOTE — Progress Notes (Signed)
Mobility Specialist: Progress Note   08/26/20 1748  Mobility  Activity Ambulated in hall  Level of Assistance Standby assist, set-up cues, supervision of patient - no hands on  Assistive Device Front wheel walker  Distance Ambulated (ft) 220 ft  Mobility Ambulated with assistance in hallway  Mobility Response Tolerated well  Mobility performed by Mobility specialist  $Mobility charge 1 Mobility   Pt ambulated in hallway and then was wheeled outside for a few minutes. Pt stopped for a brief standing break due to his hands cramping. Pt back in bed after returning to room with call bell at his side.   [facility] Shaliyah Taite Mobility Specialist Mobility Specialist Phone: [phone]

## 2020-08-26 NOTE — Progress Notes (Signed)
CSW attempted to reach staff at [facility] - no answer, a voicemail was left requesting a return call.  CSW attempted to reach Melida Quitter of [facility] - no answer, a voicemail was left requesting a return call.  Edwin Dada, MSW, LCSW Transitions of Care  Clinical Social Worker II [phone]

## 2020-08-26 NOTE — Care Management (Cosign Needed)
    Durable Medical Equipment  (From admission, onward)         Start     Ordered   08/26/20 1426  For home use only DME lightweight manual wheelchair with seat cushion  Once       Comments: Patient suffers from S/P Left below the knee amputation which impairs their ability to perform daily activities like ADLs:[id number] in the home.  A walking ULA:[id number] will not resolve  issue with performing activities of daily living. A wheelchair will allow patient to safely perform daily activities. Patient is not able to propel themselves in the home using a standard weight wheelchair due to weakness:20653. Patient can self propel in the lightweight wheelchair. Length of need lifetime.  Needs Light weight 18 x16 if available with Left amputee pad, anti-tippers, desk armrests, Right foot rest, and wheelchair cushion. Accessories: elevating leg rests (ELRs), wheel locks, extensions and anti-tippers.   08/26/20 1429   08/04/20 1126  For home use only DME Walker rolling  Once       Question Answer Comment  Walker: With 5 Inch Wheels   Patient needs a walker to treat with the following condition Ambulatory dysfunction      08/04/20 1125

## 2020-08-26 NOTE — TOC Transition Note (Signed)
Transition of Care [facility]) - CM/SW Discharge Note   Patient Details  Name: Jake Nguyen MRN: [id number] Date of Birth: [birth year]  Transition of Care [facility]) CM/SW Contact:  Janae Bridgeman, RN Phone Number: 08/26/2020, 2:45 PM   Clinical Narrative:    Case management spoke with Edwin Dada, MSW and [facility] Team is able to establish an available homeless shelter with an available bed.  The patient is mobile on the nursing unit with a rolling walker and PT/OT therapy visits.  The patient will be discharged to the [facility] tomorrow to be placed on the waiting list for an available bed.  CM placed a MATCH since the patient does have have availability to pay for discharge medications.  TOC pharmacy is aware and is coordinating affordability of medications.  The patient was set up with [facility] for follow up and Clinic visit with [facility] for follow up visit and staple removal.  DME orders were placed and to be co-signed by Junious Silk, NP.  [employer] was called and wheelchair with accessories and RW were ordered to be delivered to the hospital room.  CM and MSW will continue to follow for discharge needs.   Final next level of care: Homeless Shelter (Unable to find available homeless shelter in [place], [place], [place], or surrounding areas for placement for the patient.  No shelter beds available.) Barriers to Discharge: Homeless with medical needs (Planning to discharge to [facility] tomorrow, 08/27/2020)   Patient Goals and CMS Choice Patient states their goals for this hospitalization and ongoing recovery are:: Patient wants to get better. CMS Medicare.gov Compare Post Acute Care list provided to:: Patient Choice offered to / list presented to : Patient  Discharge Placement                       Discharge Plan and Services In-house Referral: Clinical Social [facility] Discharge Planning Services: CM Consult,Indigent Health  [facility],Follow-up appt scheduled,Medication Assistance Post Acute Care Choice: Durable Medical Equipment          DME Arranged: Dan Humphreys rolling,Wheelchair manual DME Agency: [employer] Date DME Agency Contacted: 08/26/20 Time DME Agency Contacted: 1440 Representative spoke with at DME Agency: Maud Deed, CM at [employer]            Social Determinants of Health (SDOH) Interventions     Readmission Risk Interventions Readmission Risk Prevention Plan 08/10/2020  Transportation Screening Complete  PCP or Specialist Appt within 5-7 Days Complete  Home Care Screening Complete  Medication Review (RN CM) Complete  Some recent data might be hidden

## 2020-08-26 NOTE — Progress Notes (Signed)
Physical Therapy Treatment Patient Details Name: Jake Nguyen MRN: [id number] DOB: 03-25-1966 Today's Date: 08/26/2020    History of Present Illness 55 y.o. male presents to [facility] ED on 07/31/2020 with worsening L foot pain. Pt with history of L foot gangrene for ~1 month, there were plans for vascular surgery at [facility] however pt left AMA. He had LLE angiogram 4/26.  Pt S/P left BKA 4/27. PMH includes medical history significant of alcohol abuse, left foot gangrene.    PT Comments    Pt expressed being very depressed today, noted tearful and questioning how he's going to get through this  (hurdles).  Emphasis on addressing all the positives that pt is excelling at.  Stress on safety, feedback given on all of his unsafe habits.  Standing exercise, transfer safety/technique, progression of gait stability.  Will decrease frequency, focus on stairs   Follow Up Recommendations  No PT follow up;Supervision - Intermittent;Other (comment) (OPPT for prosthetic training when appropriate.)     Equipment Recommendations  Wheelchair (measurements PT);Wheelchair cushion (measurements PT)    Recommendations for Other Services       Precautions / Restrictions Precautions Precautions: Fall Precaution Comments: L BKA    Mobility  Bed Mobility   Bed Mobility: Supine to Sit;Sit to Supine     Supine to sit: Modified independent (Device/Increase time);HOB elevated Sit to supine: Modified independent (Device/Increase time)        Transfers Overall transfer level: Needs assistance Equipment used: Rolling walker (2 wheeled) Transfers: Sit to/from [employer] Transfers Sit to Stand: Modified independent (Device/Increase time) Stand pivot transfers: Supervision Squat pivot transfers: Supervision     General transfer comment: pt has fallen recently trying to do transfers himself without safe set up in the room.  Cues to reinforce safe hand placement, but generally  if set up is safe, pt is very safe and functional.  Ambulation/Gait Ambulation/Gait assistance: Supervision Gait Distance (Feet): 75 Feet Assistive device: Rolling walker (2 wheeled) Gait Pattern/deviations: Step-to pattern Gait velocity: reduced Gait velocity interpretation: <1.8 ft/sec, indicate of risk for recurrent falls General Gait Details: safe and well control "swing to " pattern.  Cues for L LE positioning in stance and at rest to work toward being able to utilize prosthesis well in the future.   Stairs             Wheelchair Mobility    Modified Rankin (Stroke Patients Only)       Balance Overall balance assessment: Needs assistance Sitting-balance support: No upper extremity supported;Feet supported Sitting balance-Leahy Scale: Good     Standing balance support: Single extremity supported;Bilateral upper extremity supported;No upper extremity supported;During functional activity Standing balance-Leahy Scale: Fair Standing balance comment: mostly reliant on at least 1 extremitiy, but very stable on 1 LE                            Cognition Arousal/Alertness: Awake/alert Behavior During Therapy: WFL for tasks assessed/performed Overall Cognitive Status: Within Functional Limits for tasks assessed (NT formally)                           Safety/Judgement: Decreased awareness of safety            Exercises Amputee Exercises Hip Extension: AROM;Strengthening;Left;10 reps;Standing Hip ABduction/ADduction: AROM;Strengthening;Left;10 reps;Standing Hip Flexion/Marching: AROM;Strengthening;Left;10 reps;Standing    General Comments        Pertinent Vitals/Pain Pain Assessment:  Faces Faces Pain Scale: Hurts little more Pain Location: L LE Pain Descriptors / Indicators: Sore;Discomfort Pain Intervention(s): Monitored during session;Other (comment) (tapotement, rubbing.)    Home Living                      Prior Function             PT Goals (current goals can now be found in the care plan section) Acute Rehab PT Goals PT Goal Formulation: With patient Time For Goal Achievement: 09/07/20 Potential to Achieve Goals: Good Progress towards PT goals: Progressing toward goals    Frequency    Min 1X/week      PT Plan Current plan remains appropriate;Frequency needs to be updated    Co-evaluation              AM-PAC PT "6 Clicks" Mobility   Outcome Measure  Help needed turning from your back to your side while in a flat bed without using bedrails?: None Help needed moving from lying on your back to sitting on the side of a flat bed without using bedrails?: None Help needed moving to and from a bed to a chair (including a wheelchair)?: None Help needed standing up from a chair using your arms (e.g., wheelchair or bedside chair)?: None Help needed to walk in hospital room?: A Little Help needed climbing 3-5 steps with a railing? : A Lot 6 Click Score: 21    End of Session   Activity Tolerance: Patient tolerated treatment well Patient left: in bed;with call bell/phone within reach Nurse Communication: Mobility status PT Visit Diagnosis: Other abnormalities of gait and mobility (R26.89);Pain Pain - Right/Left: Left Pain - part of body: Leg     Time: 1829-9371 PT Time Calculation (min) (ACUTE ONLY): 35 min  Charges:  $Gait Training: 8-22 mins $Therapeutic Activity: 8-22 mins                     08/26/2020  Jacinto Halim., PT Acute Rehabilitation Services [phone]  (pager) [phone]  (office)   Eliseo Gum [place] 08/26/2020, 11:11 AM

## 2020-08-26 NOTE — Progress Notes (Signed)
[facility] PROGRESS NOTE  Jake Nguyen QPY:[id number] DOB: 12/17/1965 DOA: 07/31/2020 PCP: Jake Nguyen, No Pcp Per (Inactive)  Status: Remains inpatient appropriate because:Ongoing active pain requiring inpatient pain management and Unsafe d/c plan   Dispo:  Jake Nguyen From:  Boarding house  Planned Disposition: Shelter vs streets  Medically stable for discharge:  yes  BARRIERS TO dc: Jake Nguyen currently has Medicaid pending but has no funding source to pay for medications that will not be available through the [facility] health and wellness community clinic after discharge.  Primary concern is that Jake Nguyen (at least for the short-term) will need up to 3 to 6 months of chronic narcotics consisting of short acting Oxy IR and MS Contin as well as baclofen and Lyrica to control severe neuropathic and phantom limb pain after left BKA.  At present [facility] health currently is only able to provide a 1 week supply of narcotics per our policy.  I have been speaking with [facility] pharmacist about options.   Level of care: Med-Surg  Code Status: Full Family Communication: Jake Nguyen and significant other on 5/12 DVT prophylaxis: Lovenox Vaccination status: Unvaccinated but as of 5/2 has agreed to take the first shot of Moderna    HPI:  55 year old male with past medical history significant for EtOH abuse, left foot gangrene who presented to [facility], ED with worsening left foot pain.  Jake Nguyen developed left foot gangrene about 1 month ago, evaluated at [facility] with plan for vascular surgery evaluation; however Jake Nguyen left AMA.  He reports being wheelchair-bound for past 2 months because of an bearable left foot pain.  Initial work-up in the ER consistent with soft tissue gangrene but no osteomyelitis.  Vascular service was consulted.  Aortogram revealed significant vascular disease and Jake Nguyen subsequently underwent left BKA.  Initial recommendation was for SNF placement but with adequate control of  severe phantom limb and neuropathic pain Jake Nguyen mobility has improved to the point that he does not qualify for short-term rehab.  As noted above, further concerning issues after discharge is Jake Nguyen's lack of access to necessary pain medications to ensure adequate mobility after discharge.  Also he is a new amputee and has no options for discharge except to a shelter (currently no available beds) or the streets with his brother.   Subjective: Awake and sitting on side of bed working with PT.  No questions  Objective: Vitals:   08/26/20 0324 08/26/20 0725  BP: 95/62 110/75  Pulse: 90 84  Resp: 18 17  Temp: 98 F (36.7 C) 98 F (36.7 C)  SpO2: 90% 100%    Intake/Output Summary (Last 24 hours) at 08/26/2020 0833 Last data filed at 08/26/2020 0750 Gross per 24 hour  Intake --  Output 1452 ml  Net -1452 ml   Filed Weights   07/31/20 1300 07/31/20 2135  Weight: 68.9 kg 67.3 kg    Exam:  Constitutional: Alert and pleasant, no acute distress Respiratory: Lungs are clear and he remained stable on room air Cardiovascular: Normal heart sounds, no peripheral edema, regular pulse Abdomen: LBM 5/16, soft nontender nondistended.  Eating well Musculoskeletal: Left BKA site unremarkable-staples remain intact Neurologic: Cranial nerves are intact, no focal neurological deficits, with recent BKA ambulates with walker and wheelchair Psychiatric: Alert and oriented x3, pleasant   Assessment/Plan: Acute problems: Left foot gangrene, severe-s/p left BKA 4/27 -- Imaging this admission consistent with soft tissue gangrene and additional imaging also consistent with moderate to severe PAD --OP FU w/ vascular surgery in 4 weeks  for staple removal (5/27) --Mobility has significantly improved  Peripheral artery disease --Confirmed on imaging this admission  -- Continue atorvastatin and aspirin  Neuropathy/phantom limb pain -- Continue Lyrica, OxyContin, short acting Oxy IR for breakthrough  pain and scheduled baclofen -- Anticipate Jake Nguyen will need a minimum of 3 and a maximum of 6 months of MS Contin and short acting Oxy IR with hopeful eventual tapering and discontinuation of narcotics in favor of NSAIDs and Tylenol.  Anticipate will need to remain on baclofen and Lyrica for an indefinite amount of time.  Mood disorder /anxiety/insomnia -- Continue Seroquel  Chronic blood loss anemia in the setting of left foot gangrene Baseline hemoglobin around 12 --Hgb 10.5 >8.5 >8.1 >7.8 >7.2 > 7.5 > 7.7 --Iron 13 with a saturation of 4-given IV iron followed by oral iron  --Transfuse for hgb </= 7.0 or if active bleeding  Polysubstance abuse: EtOH and tobacco  -- Continue multivitamin, thiamine, folic acid and nicotine patch  Physical deconditioning/ambulatory dysfunction secondary to new BKA --Improvement in mobility therefore does not meet requirements for short-term rehab -Mobility significantly improved with adequate pain control -- Still trying to locate safe discharge disposition and are hopeful he can discharge with family intact    Other problems: Hypovolemic hyponatremia:  Resolved  Hypomagnesemia Resolved  Hypokalemia:  Resolved     Data Reviewed: Basic Metabolic Panel: Recent Labs  Lab 08/24/20 0158  NA 135  K 3.8  CL 98  CO2 30  GLUCOSE 110*  BUN 6  CREATININE 0.54*  CALCIUM 9.0  MG 1.7   CBC: Recent Labs  Lab 08/24/20 0158  WBC 9.1  HGB 9.0*  HCT 29.3*  MCV 84.0  PLT 420*     Studies: No results found.  Scheduled Meds: . aspirin EC  325 mg Oral Daily  . atorvastatin  40 mg Oral Daily  . baclofen  5 mg Oral TID  . enoxaparin (LOVENOX) injection  40 mg Subcutaneous Q24H  . ferrous sulfate  325 mg Oral BID WC  . folic acid  1 mg Oral Daily  . morphine  30 mg Oral Q12H  . multivitamin with minerals  1 tablet Oral Daily  . nicotine  21 mg Transdermal Daily  . pregabalin  150 mg Oral TID  . senna-docusate  2 tablet Oral BID  .  sodium chloride flush  3 mL Intravenous Q12H  . thiamine  100 mg Oral Daily   Or  . thiamine  100 mg Intravenous Daily   Continuous Infusions: . sodium chloride 250 mL (08/05/20 2120)    Active Problems:   Alcohol abuse with alcohol-induced mood disorder (HCC)   Gangrene of foot (HCC)   Gangrene (HCC)   S/P BKA (below knee amputation) unilateral, left (HCC)   Neuropathic pain, leg, left   Consultants:  Psychiatry  Vascular surgery  Procedures:  Adominal aortogram, 08/04/2020, Dr. Myra Gianotti  Left BKA, 08/05/2020, Dr. Myra Gianotti   Antibiotics:  Vancomycin 4/22>>  Cefepime 4/24 - 4/28  Zosyn 4/22 - 4/28    Time spent: 20 minutes    Junious Silk ANP  [facility] 7 am - 330 pm/M-F for direct Jake Nguyen care and secure chat Please refer to Amion for contact info 26  days

## 2020-08-27 NOTE — Progress Notes (Signed)
CSW notified Dennison Bulla of [employer] of the patient's updated address so that he can receive any correspondence from [facility].  Edwin Dada, MSW, LCSW Transitions of Care  Clinical Social Worker II [phone]

## 2020-08-27 NOTE — Progress Notes (Signed)
   08/27/20 1500  OT Visit Information  Last OT Received On 08/27/20  Assistance Needed +1  History of Present Illness 55 y.o. male presents to [facility] ED on 07/31/2020 with worsening L foot pain. Pt with history of L foot gangrene for ~1 month, there were plans for vascular surgery at [facility] however pt left AMA. He had LLE angiogram 4/26.  Pt S/P left BKA 4/27. PMH includes medical history significant of alcohol abuse, left foot gangrene.  Precautions  Precautions Fall  Precaution Comments L BKA  Pain Assessment  Pain Assessment Faces  Faces Pain Scale 2  Pain Location L LE  Pain Descriptors / Indicators Discomfort  Pain Intervention(s) Monitored during session  Cognition  Arousal/Alertness Awake/alert  Behavior During Therapy WFL for tasks assessed/performed  Overall Cognitive Status Within Functional Limits for tasks assessed  General Comments Pt using air freshener for deodorant.  ADL  Overall ADL's  Needs assistance/impaired  Grooming Applying deodorant;Sitting;Supervision/safety  Upper Body Bathing Set up;Sitting  Toilet Transfer Supervision/safety;Stand-pivot;Grab bars;Regular Toilet  Toileting- Clothing Manipulation and Hygiene Supervision/safety;Sit to/from stand  Functional mobility during ADLs Supervision/safety;Wheelchair  General ADL Comments Pt educated in importance of using w/c locks with every transfer, how to manage arms of w/c, elevating foot rests, folding for car transport.  Balance  Overall balance assessment Needs assistance  Sitting-balance support No upper extremity supported;Feet supported  Sitting balance-Leahy Scale Good  Standing balance-Leahy Scale Fair  Standing balance comment mostly reliant on at least 1 extremitiy, but very stable on 1 LE  Restrictions  LLE Weight Bearing NWB  Transfers  Overall transfer level Needs assistance  Equipment used None (bathroom grab bar)  Transfers Sit to/from Stand;Stand Pivot Transfers  Sit to Stand Supervision   Stand pivot transfers Supervision  OT - End of Session  Equipment Utilized During Treatment  (wheelchair)  Activity Tolerance Patient tolerated treatment well  Patient left with nursing/sitter in room;with family/visitor present (in w/c)  OT Assessment/Plan  OT Plan Discharge plan remains appropriate  OT Visit Diagnosis Pain;Muscle weakness (generalized) (M62.81);Other abnormalities of gait and mobility (R26.89)  Pain - Right/Left Left  Pain - part of body Leg  OT Frequency (ACUTE ONLY) Min 2X/week  Follow Up Recommendations No OT follow up  OT Equipment 3 in 1 bedside commode;Wheelchair (measurements OT);Wheelchair cushion (measurements OT)  AM-PAC OT "6 Clicks" Daily Activity Outcome Measure (Version 2)  Help from another person eating meals? 4  Help from another person taking care of personal grooming? 4  Help from another person toileting, which includes using toliet, bedpan, or urinal? 4  Help from another person bathing (including washing, rinsing, drying)? 4  Help from another person to put on and taking off regular upper body clothing? 4  Help from another person to put on and taking off regular lower body clothing? 4  6 Click Score 24  OT Goal Progression  Progress towards OT goals Progressing toward goals  Acute Rehab OT Goals  Patient Stated Goal to have less pain and get out of the hospital soon  OT Goal Formulation With patient  Time For Goal Achievement 09/02/20  Potential to Achieve Goals Good  OT Time Calculation  OT Start Time (ACUTE ONLY) 1355  OT Stop Time (ACUTE ONLY) 1410  OT Time Calculation (min) 15 min  OT General Charges  $OT Visit 1 Visit  OT Treatments  $Self Care/Home Management  8-22 mins  Martie Round, OTR/L Acute Rehabilitation Services Pager: [phone] Office: [phone]

## 2020-08-27 NOTE — TOC Progression Note (Signed)
Transition of Care [facility]) - Progression Note    Patient Details  Name: Jake Nguyen MRN: [id number] Date of Birth: 08/12/1965  Transition of Care [facility]) CM/SW Contact  Janae Bridgeman, RN Phone Number: 08/27/2020, 10:54 AM  Clinical Narrative:    Case management spoke with the patient at the bedside regarding transitions of care and discharge back to the homeless shelter in [place], [place] today.  The patient states that he has available family to pick him up by car today [name] (nephew) - [phone].    CM spoke with [employer] and they will be delivering charity [facility] (with amputee accessories) and RW to the hospital room prior to 12 noon today once [employer] gets their shipment of equipment.  Bedside nursing is aware that patient will need dme and TOC meds delivered prior to discharge home from the hospital today.  TOC medications will be delivered to the patient's bedside today.  CM spoke to the patient regarding follow up visits with physician offices after discharge and patient plans to take bus or have family provide transportation.  The patient was given 3 bus tickets for future use for transportation to clinic visits.  Patient plans to follow up with [facility] concerning need for available shelter bed.  CM and MSW will continue to follow for discharge planning to shelter with family assistance today.   Expected Discharge Plan: Homeless Shelter Barriers to Discharge: Homeless with medical needs (Planning to discharge to [facility] tomorrow, 08/27/2020)  Expected Discharge Plan and Services Expected Discharge Plan: Homeless Shelter In-house Referral: Clinical Social [facility] Discharge Planning Services: CM Consult,Indigent Health [facility],Follow-up appt scheduled,Medication Assistance Post Acute Care Choice: Durable Medical Equipment Living arrangements for the past 2 months: Homeless Shelter                 DME Arranged: Dan Humphreys rolling,Wheelchair manual DME  Agency: [employer] Date DME Agency Contacted: 08/26/20 Time DME Agency Contacted: 1440 Representative spoke with at DME Agency: Maud Deed, CM at [employer]             Social Determinants of Health (SDOH) Interventions    Readmission Risk Interventions Readmission Risk Prevention Plan 08/10/2020  Transportation Screening Complete  PCP or Specialist Appt within 5-7 Days Complete  Home Care Screening Complete  Medication Review (RN CM) Complete  Some recent data might be hidden

## 2020-08-27 NOTE — Discharge Summary (Signed)
Physician Discharge Summary  Jake Nguyen YNW:[id number] DOB: 1965-10-23 DOA: 07/31/2020  PCP: Patient, No Pcp Per (Inactive)  Admit date: 07/31/2020 Discharge date: 08/27/2020  Time spent: 45 minutes  Recommendations for Outpatient Follow-up:   1. Patient has follow-up appointment with physician extender at [facility] on June 6 at 8:45 AM for staple removal and postoperative follow-up 2. Patient has a PCP appointment to establish care with [facility] on June 22 at 1:50 PM 3. Unfortunately despite all her best efforts we have been unable to locate a homeless shelter.  Patient will be discharged with instructions to follow-up at [facility] which is a homeless resource hub   Discharge Diagnoses:  Active Problems:   Alcohol abuse with alcohol-induced mood disorder (HCC)   Gangrene of foot (HCC)   Gangrene (HCC)   S/P BKA (below knee amputation) unilateral, left (HCC)   Neuropathic pain, leg, left    Discharge Condition: Stable  Diet recommendation: Regular  Filed Weights   07/31/20 1300 07/31/20 2135  Weight: 68.9 kg 67.3 kg    History of present illness:  55 year old male with past medical history significant for EtOH abuse, left foot gangrene who presented to [facility], ED with worsening left foot pain. Patient developed left foot gangrene about 1 month ago, evaluated at [facility] with plan for vascular surgery evaluation; however patient left AMA. He reports being wheelchair-bound for past 2 months because of an bearable left foot pain.  Initial work-up in the ER consistent with soft tissue gangrene but no osteomyelitis.  Vascular service was consulted.  Aortogram revealed significant vascular disease and patient subsequently underwent left BKA.  Initial recommendation was for SNF placement but with adequate control of severe phantom limb and neuropathic pain patient mobility has improved to the point that he does not  qualify for short-term rehab.  As noted above, further concerning issues after discharge is patient's lack of access to necessary pain medications to ensure adequate mobility after discharge.  Also he is a new amputee and has no options for discharge except to a shelter (currently no available beds) or the streets with his brother.  Hospital Course:  Acute problems: Left foot gangrene, severe-s/p left BKA 4/27 -- Imaging this admission consistent with soft tissue gangrene and additional imaging also consistent with moderate to severe PAD --OP FU w/ vascular surgery in 4 weeks for staple removal (5/27) --Mobility has significantly improved  Peripheral artery disease --Confirmed on imaging this admission -- Continue atorvastatin and aspirin  Neuropathy/phantom limb pain -- Continue Lyrica, OxyContin, short acting Oxy IR for breakthrough pain and scheduled baclofen -- Anticipate patient will need a minimum of 3 and a maximum of 6 months of MS Contin and short acting Oxy IR with hopeful eventual tapering and discontinuation of narcotics in favor of NSAIDs and Tylenol.  Anticipate will need to remain on baclofen and Lyrica for an indefinite amount of time.  Mood disorder /anxiety/insomnia -- Continue Seroquel  Chronic blood loss anemia in the setting of left foot gangrene Baseline hemoglobin around 12 --Hgb 10.5 >8.5 >8.1 >7.8 >7.2 > 7.5 > 7.7 --Iron 13 with a saturation of 4-given IV iron followed by oral iron  --Transfuse for hgb </= 7.0 or if active bleeding  Polysubstance abuse: EtOH and tobacco -- Continue multivitamin, thiamine, folic acid and nicotine patch  Physical deconditioning/ambulatory dysfunction secondary to new BKA --Improvement in mobility therefore does not meet requirements for short-term rehab -Mobility significantly improved with adequate  pain control -- Still trying to locate safe discharge disposition and are hopeful he can discharge with family  intact    Other problems: Hypovolemic hyponatremia:  Resolved  Hypomagnesemia Resolved  Hypokalemia:  Resolved  Procedures:  Adominal aortogram, 08/04/2020, Dr. Myra Gianotti  Left BKA, 08/05/2020, Dr. Myra Gianotti   Consultations:  Psychiatry  Vascular surgery   Discharge Exam: Vitals:   08/27/20 0441 08/27/20 0717  BP: 95/65 110/83  Pulse: 90 73  Resp: 18 17  Temp: 98.4 F (36.9 C) 97.6 F (36.4 C)  SpO2: 100% 97%   Constitutional: Alert and pleasant, no acute distress Respiratory: Lungs are clear and he remained stable on room air Cardiovascular: Normal heart sounds, no peripheral edema, regular pulse Abdomen: LBM 5/16, soft nontender nondistended.  Eating well Musculoskeletal: Left BKA site unremarkable-Nguyen remain intact Neurologic: Cranial nerves are intact, no focal neurological deficits, with recent BKA ambulates with walker and wheelchair Psychiatric: Alert and oriented x3, pleasant  Discharge Instructions   Discharge Instructions    Ambulatory referral to Vascular Surgery   Complete by: As directed    Nguyen from BKA need to be removed 5/27-homeless-appointment obtained with app on June 6th at 9-arrive at 845   Call MD for:  extreme fatigue   Complete by: As directed    Call MD for:  persistant nausea and vomiting   Complete by: As directed    Call MD for:  redness, tenderness, or signs of infection (pain, swelling, redness, odor or green/yellow discharge around incision site)   Complete by: As directed    Call MD for:  severe uncontrolled pain   Complete by: As directed    Call MD for:  temperature >100.4   Complete by: As directed    Diet general   Complete by: As directed    Discharge instructions   Complete by: As directed    Also we are limited in the amount of narcotics we can prescribe you and there is more than 10 days you were able to see a provider after discharge please take your narcotics as follows: Start with the MS Contin twice  daily first. Hold the Oxy IR/Roxicodone until the MS Contin runs out.  Then begin taking the shorter acting Oxy IR/oxycodone to replace the MS Contin. Take the baclofen and the Lyrica as prescribed You can also take Tylenol or ibuprofen over-the-counter to help with pain  Please attend all follow-up appointments as arranged.  You have been provided bus passes.  Sula Soda has agreed to use her address for future communications from [facility] and disability   Discharge wound care:   Complete by: As directed    Dry dressing over staple line as needed to prevent irritation.   Increase activity slowly   Complete by: As directed      Allergies as of 08/27/2020      Reactions   Pork-derived Products Other (See Comments)   Pt states he doesn't eat pork      Medication List    STOP taking these medications   neomycin-bacitracin-polymyxin ointment Commonly known as: NEOSPORIN   traZODone 50 MG tablet Commonly known as: DESYREL     TAKE these medications   acetaminophen 325 MG tablet Commonly known as: TYLENOL Take 2 tablets (650 mg total) by mouth every 6 (six) hours as needed for mild pain (or Fever >/= 101).   atorvastatin 40 MG tablet Commonly known as: LIPITOR Take 1 tablet (40 mg total) by mouth daily.   Baclofen 5 MG Tabs Take  5 mg by mouth 3 (three) times daily.   FeroSul 325 (65 FE) MG tablet Generic drug: ferrous sulfate Take 1 tablet (325 mg total) by mouth 2 (two) times daily with a meal.   folic acid 1 MG tablet Commonly known as: FOLVITE Take 1 tablet (1 mg total) by mouth daily.   morphine 15 MG 12 hr tablet Commonly known as: MS CONTIN Take 2 tablets (30 mg total) by mouth every 12 (twelve) hours for 7 days.   Oxycodone HCl 10 MG Tabs Take 1 tablet (10 mg total) by mouth every 4 (four) hours as needed for up to 7 days (BT pain).   pregabalin 150 MG capsule Commonly known as: LYRICA Take 1 capsule (150 mg total) by mouth 3 (three) times daily.    QUEtiapine 25 MG tablet Commonly known as: SEROQUEL Take 1 tablet (25 mg total) by mouth at bedtime as needed (sleep).   Senexon-S 8.6-50 MG tablet Generic drug: senna-docusate Take 2 tablets by mouth 2 (two) times daily.   thiamine 100 MG tablet Take 1 tablet (100 mg total) by mouth daily.   V-R ASPIRIN EC 325 MG EC tablet Generic drug: aspirin Take 1 tablet (325 mg total) by mouth daily.            Durable Medical Equipment  (From admission, onward)         Start     Ordered   08/26/20 1426  For home use only DME lightweight manual wheelchair with seat cushion  Once       Comments: Patient suffers from S/P Left below the knee amputation which impairs their ability to perform daily activities like ADLs:20651 in the home.  A walking [name]:81191 will not resolve  issue with performing activities of daily living. A wheelchair will allow patient to safely perform daily activities. Patient is not able to propel themselves in the home using a standard weight wheelchair due to weakness:20653. Patient can self propel in the lightweight wheelchair. Length of need lifetime.  Needs Light weight 18 x16 if available with Left amputee pad, anti-tippers, desk armrests, Right foot rest, and wheelchair cushion. Accessories: elevating leg rests (ELRs), wheel locks, extensions and anti-tippers.   08/26/20 1429   08/04/20 1126  For home use only DME Walker rolling  Once       Question Answer Comment  Walker: With 5 Inch Wheels   Patient needs a walker to treat with the following condition Ambulatory dysfunction      08/04/20 1125           Discharge Care Instructions  (From admission, onward)         Start     Ordered   08/27/20 0000  Discharge wound care:       Comments: Dry dressing over staple line as needed to prevent irritation.   08/27/20 1201         Allergies  Allergen Reactions  . Pork-Derived Products Other (See Comments)    Pt states he doesn't eat pork     Follow-up Information    [facility] Follow up in 5 week(s).   Specialty: Vascular Surgery Why: You are scheduled to clinic follow up and staple removal at the office on Monday, September 14, 2020 at 0845 am. Contact information: [address] [place] [address] [phone]       [facility]. Schedule an appointment as soon as possible for a visit.   Why: You  are scheduled for a hospital follow up on September 30, 2020 at 1:50 pm. Contact information: [address] [place] [address] [phone]               The results of significant diagnostics from this hospitalization (including imaging, microbiology, ancillary and laboratory) are listed below for reference.    Significant Diagnostic Studies: PERIPHERAL VASCULAR CATHETERIZATION  Result Date: 08/04/2020   Patient name: LEOMAR WESTBERG        MRN: [id number]        DOB: 1965/10/02            Sex: male  08/04/2020 Pre-operative Diagnosis: Left foot gangrene Post-operative diagnosis:  Same Surgeon:  Durene Cal Procedure Performed:             1.  Ultrasound-guided access, right femoral artery             2.  Abdominal aortogram             3.  Second-order catheterization             4.  Bilateral lower extremity runoff             5.  Conscious sedation, 35 minutes   Indications: This is a 55 year old male with a gangrenous left foot.  He comes in for arterial evaluation and possible intervention  Procedure:  The patient was identified in the holding area and taken to room 8.  The patient was then placed supine on the table and prepped and draped in the usual sterile fashion.  A time out was called.  Conscious sedation was administered with the use of IV fentanyl and Versed under continuous physician and nurse monitoring.  Heart rate, blood pressure, and oxygen saturation were continuously monitored.  Total sedation time was 115 minutes.   Ultrasound was used to evaluate the right common femoral artery.  It was patent .  A digital ultrasound image was acquired.  A micropuncture needle was used to access the right common femoral artery under ultrasound guidance.  An 018 wire was advanced without resistance and a micropuncture sheath was placed.  The 018 wire was removed and a benson wire was placed.  The micropuncture sheath was exchanged for a 5 french sheath.  An omniflush catheter was advanced over the wire to the level of L-1.  An abdominal angiogram was obtained.  Next, using the omniflush catheter and a benson wire, the aortic bifurcation was crossed and the catheter was placed into theleft external iliac artery and left runoff was obtained.  right runoff was performed via retrograde sheath injections.  Findings:             Aortogram: No significant renal artery stenosis was identified.  The infrarenal abdominal aorta was widely patent.  Bilateral common and external iliac arteries are widely patent.             Right Lower Extremity: The right common femoral profundofemoral, and superficial femoral arteries are widely patent without stenosis.  The popliteal artery is widely patent.  There is single-vessel runoff via the peroneal artery             Left Lower Extremity: The left common femoral and profundofemoral artery are widely patent without stenosis.  The superficial femoral artery is patent down to the adductor canal where it occludes.  Through geniculate collaterals, the peroneal artery reconstitutes at its origin.  It does collateralized a posterior tibial artery at the ankle which  supplies the foot  Intervention: None  Impression:             #1  No significant aortoiliac occlusive disease             #2  On the right, there was no outflow stenosis.  There is single-vessel runoff via the peroneal artery             #3  On the left, the superficial femoral artery is patent down to the adductor canal where it occludes.  There is  reconstitution of the peroneal artery which is patent down to the ankle where it collateralizes to a posterior tibial artery    V. Durene CalWells Brabham, M.D., [facility] [place] Office: [phone] Pager:  [phone]  DG Foot Complete Left  Result Date: 07/31/2020 CLINICAL DATA:  Gangrene. EXAM: LEFT FOOT - COMPLETE 3+ VIEW COMPARISON:  07/14/2020 FINDINGS: Extensive soft tissue gas is seen along the dorsal and plantar surface of the foot with associated soft tissue swelling. Negative for osteomyelitis or fracture. IMPRESSION: Progression of soft tissue gas compatible with infection. Negative for osteomyelitis. Electronically Signed   By: Marlan Palauharles  Clark M.D.   On: 07/31/2020 11:16   VAS US ABI WITH/WO TBI  Result Date: 08/04/2020  LOWER EXTREMITY DOPPLER STUDY Patient Name:  Jake AlbeeDwight L Nguyen  Date of Exam:   08/04/2020 Medical Rec #: [id number]         Accession #:    [id number] Date of Birth: 11/17/1965          Patient Gender: M Patient Age:   54Y [facility] Procedure:      VAS US ABI WITH/WO TBI Referring Phys: [id number] Rande BruntHOMAS N HAWKEN --------------------------------------------------------------------------------  Indications: Ulceration, gangrene, and peripheral artery disease. High Risk Factors: Current smoker. Other Factors: Alcohol abuse.  Comparison Study: No previous exams Performing Technologist: Hill, Jody RVT, RDMS  Examination Guidelines: A complete evaluation includes at minimum, Doppler waveform signals and systolic blood pressure reading at the level of bilateral brachial, anterior tibial, and posterior tibial arteries, when vessel segments are accessible. Bilateral testing is considered an integral part of a complete examination. Photoelectric Plethysmograph (PPG) waveforms and toe systolic pressure readings are included as required and additional duplex testing as needed. Limited examinations for reoccurring indications may be performed as  noted.  ABI Findings: +---------+------------------+-----+----------+--------+ Right    Rt Pressure (mmHg)IndexWaveform  Comment  +---------+------------------+-----+----------+--------+ Brachial 128                    triphasic          +---------+------------------+-----+----------+--------+ PTA      131               0.94 monophasic         +---------+------------------+-----+----------+--------+ DP       159               1.14 biphasic           +---------+------------------+-----+----------+--------+ Great Toe55                0.40 Abnormal           +---------+------------------+-----+----------+--------+ +---------+------------------+-----+-------------------+-------+ Left     Lt Pressure (mmHg)IndexWaveform           Comment +---------+------------------+-----+-------------------+-------+ Brachial 139                    triphasic                  +---------+------------------+-----+-------------------+-------+  PTA      69                0.50 dampened monophasic        +---------+------------------+-----+-------------------+-------+ DP       0                 0.00 absent                     +---------+------------------+-----+-------------------+-------+ Great Toe0                 0.00 Absent                     +---------+------------------+-----+-------------------+-------+ No previous exams.  Summary: Right: Resting right ankle-brachial index is within normal range. No evidence of significant right lower extremity arterial disease. The right toe-brachial index is abnormal. Left: Resting left ankle-brachial index indicates severe left lower extremity arterial disease. The left toe-brachial index is absent.  *See table(s) above for measurements and observations.  Electronically signed by Waverly Ferrari MD on 08/04/2020 at 7:05:56 PM.    Final     Microbiology: No results found for this or any previous visit (from the past 240 hour(s)).    Labs: Basic Metabolic Panel: Recent Labs  Lab 08/24/20 0158  NA 135  K 3.8  CL 98  CO2 30  GLUCOSE 110*  BUN 6  CREATININE 0.54*  CALCIUM 9.0  MG 1.7   Liver Function Tests: No results for input(s): AST, ALT, ALKPHOS, BILITOT, PROT, ALBUMIN in the last 168 hours. No results for input(s): LIPASE, AMYLASE in the last 168 hours. No results for input(s): AMMONIA in the last 168 hours. CBC: Recent Labs  Lab 08/24/20 0158  WBC 9.1  HGB 9.0*  HCT 29.3*  MCV 84.0  PLT 420*   Cardiac Enzymes: No results for input(s): CKTOTAL, CKMB, CKMBINDEX, TROPONINI in the last 168 hours. BNP: BNP (last 3 results) No results for input(s): BNP in the last 8760 hours.  ProBNP (last 3 results) No results for input(s): PROBNP in the last 8760 hours.  CBG: No results for input(s): GLUCAP in the last 168 hours.     Signed:  Junious Silk ANP [facility] 08/27/2020, 12:01 PM

## 2020-08-27 NOTE — Progress Notes (Signed)
Pt/family given discharge instructions, medication lists, follow up appointments, and when to call the doctor.  Pt/family verbalizes understanding. Pt with TOC meds in room, wheelchair and walker with patient. Transported to main entrance with via wheelchair. Friend gave tranport. Thomas Hoff, RN

## 2020-08-31 ENCOUNTER — Other Ambulatory Visit (HOSPITAL_COMMUNITY): Payer: Self-pay

## 2020-09-30 ENCOUNTER — Other Ambulatory Visit: Payer: Self-pay

## 2020-09-30 ENCOUNTER — Ambulatory Visit: Payer: Self-pay | Attending: Physician Assistant | Admitting: Physician Assistant

## 2020-09-30 NOTE — Progress Notes (Deleted)
Patient ID: Jake Nguyen, male   DOB: 06/14/1965, 55 y.o.   MRN: [id number]   Admit date: 07/31/2020 Discharge date: 08/27/2020   Time spent: 45 minutes   Recommendations for Outpatient Follow-up:   Patient has follow-up appointment with physician extender at [facility] on June 6 at 8:45 AM for staple removal and postoperative follow-up Patient has a PCP appointment to establish care with [facility] on June 22 at 1:50 PM Unfortunately despite all her best efforts we have been unable to locate a homeless shelter.  Patient will be discharged with instructions to follow-up at [facility] which is a homeless resource hub     Discharge Diagnoses:  Active Problems:   Alcohol abuse with alcohol-induced mood disorder (HCC)   Gangrene of foot (HCC)   Gangrene (HCC)   S/P BKA (below knee amputation) unilateral, left (HCC)   Neuropathic pain, leg, left   History of present illness:  55 year old male with past medical history significant for EtOH abuse, left foot gangrene who presented to [facility], ED with worsening left foot pain.  Patient developed left foot gangrene about 1 month ago, evaluated at [facility] with plan for vascular surgery evaluation; however patient left AMA.  He reports being wheelchair-bound for past 2 months because of an bearable left foot pain.   Initial work-up in the ER consistent with soft tissue gangrene but no osteomyelitis.  Vascular service was consulted.  Aortogram revealed significant vascular disease and patient subsequently underwent left BKA.  Initial recommendation was for SNF placement but with adequate control of severe phantom limb and neuropathic pain patient mobility has improved to the point that he does not qualify for short-term rehab.  As noted above, further concerning issues after discharge is patient's lack of access to necessary pain medications to ensure adequate mobility after discharge.  Also he  is a new amputee and has no options for discharge except to a shelter (currently no available beds) or the streets with his brother.   Hospital Course:  Acute problems: Left foot gangrene, severe-s/p left BKA 4/27 -- Imaging this admission consistent with soft tissue gangrene and additional imaging also consistent with moderate to severe PAD --OP FU w/ vascular surgery in 4 weeks for staple removal (5/27) --Mobility has significantly improved   Peripheral artery disease --Confirmed on imaging this admission  -- Continue atorvastatin and aspirin   Neuropathy/phantom limb pain -- Continue Lyrica, OxyContin, short acting Oxy IR for breakthrough pain and scheduled baclofen -- Anticipate patient will need a minimum of 3 and a maximum of 6 months of MS Contin and short acting Oxy IR with hopeful eventual tapering and discontinuation of narcotics in favor of NSAIDs and Tylenol.  Anticipate will need to remain on baclofen and Lyrica for an indefinite amount of time.   Mood disorder /anxiety/insomnia -- Continue Seroquel   Chronic blood loss anemia in the setting of left foot gangrene Baseline hemoglobin around 12 --Hgb 10.5 >8.5 >8.1 >7.8 >7.2 > 7.5 > 7.7 --Iron 13 with a saturation of 4-given IV iron followed by oral iron --Transfuse for hgb </= 7.0 or if active bleeding   Polysubstance abuse: EtOH and tobacco  -- Continue multivitamin, thiamine, folic acid and nicotine patch   Physical deconditioning/ambulatory dysfunction secondary to new BKA --Improvement in mobility therefore does not meet requirements for short-term rehab -Mobility significantly improved with adequate pain control -- Still trying to locate safe discharge disposition and are hopeful he can discharge  with family intact       Other problems: Hypovolemic hyponatremia: Resolved   Hypomagnesemia Resolved   Hypokalemia: Resolved

## 2020-10-01 ENCOUNTER — Encounter (HOSPITAL_COMMUNITY): Payer: Self-pay | Admitting: *Deleted

## 2020-10-01 ENCOUNTER — Emergency Department (HOSPITAL_COMMUNITY)
Admission: EM | Admit: 2020-10-01 | Discharge: 2020-10-01 | Disposition: A | Discharge status: Home / Self Care | Payer: Medicaid Other | Attending: Emergency Medicine | Admitting: Emergency Medicine

## 2020-10-01 DIAGNOSIS — F17210 Nicotine dependence, cigarettes, uncomplicated: Secondary | ICD-10-CM | POA: Diagnosis not present

## 2020-10-01 DIAGNOSIS — Z7982 Long term (current) use of aspirin: Secondary | ICD-10-CM | POA: Insufficient documentation

## 2020-10-01 DIAGNOSIS — Z5900 Homelessness unspecified: Secondary | ICD-10-CM | POA: Insufficient documentation

## 2020-10-01 DIAGNOSIS — F10929 Alcohol use, unspecified with intoxication, unspecified: Secondary | ICD-10-CM

## 2020-10-01 DIAGNOSIS — F10129 Alcohol abuse with intoxication, unspecified: Secondary | ICD-10-CM | POA: Diagnosis not present

## 2020-10-01 DIAGNOSIS — Y908 Blood alcohol level of 240 mg/100 ml or more: Secondary | ICD-10-CM | POA: Insufficient documentation

## 2020-10-01 DIAGNOSIS — F329 Major depressive disorder, single episode, unspecified: Secondary | ICD-10-CM | POA: Diagnosis not present

## 2020-10-01 LAB — CBC WITH DIFFERENTIAL/PLATELET
Abs Immature Granulocytes: 0.01 10*3/uL (ref 0.00–0.07)
Basophils Absolute: 0 10*3/uL (ref 0.0–0.1)
Basophils Relative: 1 %
Eosinophils Absolute: 0.1 10*3/uL (ref 0.0–0.5)
Eosinophils Relative: 2 %
HCT: 42.7 % (ref 39.0–52.0)
Hemoglobin: 13.7 g/dL (ref 13.0–17.0)
Immature Granulocytes: 0 %
Lymphocytes Relative: 43 %
Lymphs Abs: 1.8 10*3/uL (ref 0.7–4.0)
MCH: 26.3 pg (ref 26.0–34.0)
MCHC: 32.1 g/dL (ref 30.0–36.0)
MCV: 82.1 fL (ref 80.0–100.0)
Monocytes Absolute: 0.3 10*3/uL (ref 0.1–1.0)
Monocytes Relative: 8 %
Neutro Abs: 2 10*3/uL (ref 1.7–7.7)
Neutrophils Relative %: 46 %
Platelets: 83 10*3/uL — ABNORMAL LOW (ref 150–400)
RBC: 5.2 MIL/uL (ref 4.22–5.81)
RDW: 18.3 % — ABNORMAL HIGH (ref 11.5–15.5)
WBC: 4.1 10*3/uL (ref 4.0–10.5)
nRBC: 0 % (ref 0.0–0.2)

## 2020-10-01 LAB — COMPREHENSIVE METABOLIC PANEL
ALT: 25 U/L (ref 0–44)
AST: 52 U/L — ABNORMAL HIGH (ref 15–41)
Albumin: 4 g/dL (ref 3.5–5.0)
Alkaline Phosphatase: 63 U/L (ref 38–126)
Anion gap: 13 (ref 5–15)
BUN: <5 mg/dL — ABNORMAL LOW (ref 6–20)
CO2: 28 mmol/L (ref 22–32)
Calcium: 8.5 mg/dL — ABNORMAL LOW (ref 8.9–10.3)
Chloride: 96 mmol/L — ABNORMAL LOW (ref 98–111)
Creatinine, Ser: 0.48 mg/dL — ABNORMAL LOW (ref 0.61–1.24)
GFR, Estimated: >60 mL/min (ref >60)
Glucose, Bld: 103 mg/dL — ABNORMAL HIGH (ref 70–99)
Potassium: 3.5 mmol/L (ref 3.5–5.1)
Sodium: 137 mmol/L (ref 135–145)
Total Bilirubin: 0.5 mg/dL (ref 0.3–1.2)
Total Protein: 8.9 g/dL — ABNORMAL HIGH (ref 6.5–8.1)

## 2020-10-01 LAB — ETHANOL: Alcohol, Ethyl (B): 318 mg/dL (ref <10)

## 2020-10-01 LAB — CBG MONITORING, ED: Glucose-Capillary: 89 mg/dL (ref 70–99)

## 2020-10-01 MED ORDER — THIAMINE HCL 100 MG/ML IJ SOLN
100.0000 mg | Freq: Once | INTRAMUSCULAR | Status: AC
  Administered 2020-10-01: 100 mg via INTRAVENOUS
  Filled 2020-10-01: qty 2

## 2020-10-01 MED ORDER — SODIUM CHLORIDE 0.9 % IV BOLUS
1000.0000 mL | Freq: Once | INTRAVENOUS | Status: AC
  Administered 2020-10-01: 1000 mL via INTRAVENOUS

## 2020-10-01 NOTE — ED Triage Notes (Addendum)
Per EMS, pt found by side of road, reports drinking alcohol since last night, had half a case of beer near him. Was given choice between jail or hospital. No complaints. CBG 109.  BP 107/61 HR 104

## 2020-10-01 NOTE — ED Provider Notes (Signed)
[facility]-EMERGENCY DEPT Provider Note   CSN: [id number] Arrival date & time: 10/01/20  7209     History Chief Complaint  Patient presents with   Alcohol Intoxication    Jake Nguyen is a 55 y.o. male.  HPI Patient is a 55 year old male with a history of alcoholism as well as homelessness who presents to the emergency department due to alcohol intoxication.  Patient brought in via [employer].  He was found by the side of the road in a wheelchair with half case of beer next to him.  He was told "he can go to jail or the hospital".  Patient is fatigued appearing and goes in and out of sleep during my exam.  Does not want to answer my questions.  Follows commands and speaks clearly when he does answer questions.  States he has no complaints at this time.  Level 5 caveat due to intoxication    Past Medical History:  Diagnosis Date   Alcoholism [facility])     Patient Active Problem List   Diagnosis Date Noted   S/P BKA (below knee amputation) unilateral, left (HCC)    Neuropathic pain, leg, left    Gangrene (HCC) 07/31/2020   Gangrene of foot (HCC) 07/14/2020   Ataxia    Alcohol abuse with alcohol-induced mood disorder (HCC) 11/10/2019   Alcoholism /alcohol abuse    Alcohol withdrawal syndrome, with delirium (HCC) 11/06/2019   Pressure injury of skin 05/25/2018   Rhabdomyolysis 05/20/2018   Hyponatremia 05/20/2018   Elevated troponin 05/20/2018   Moderate dehydration 05/20/2018   Alcohol use 05/20/2018   Influenza A 05/20/2018   Transaminitis 05/20/2018    Past Surgical History:  Procedure Laterality Date   ABDOMINAL AORTOGRAM W/LOWER EXTREMITY N/A 08/04/2020   Procedure: ABDOMINAL AORTOGRAM W/LOWER EXTREMITY;  Surgeon: Nada Libman, MD;  [facility]: [facility] INVASIVE CV LAB;  Service: Cardiovascular;  Laterality: N/A;   AMPUTATION Left 08/05/2020   Procedure: AMPUTATION BELOW KNEE;  Surgeon: Nada Libman, MD;  Location: [facility] OR;  Service: Vascular;   Laterality: Left;       Family History  Family history unknown: Yes    Social History   Tobacco Use   Smoking status: Every Day    Packs/day: 0.50    Pack years: 0.00    Types: Cigarettes   Smokeless tobacco: Never  Substance Use Topics   Alcohol use: Yes    Home Medications Prior to Admission medications   Medication Sig Start Date End Date Taking? Authorizing Provider  acetaminophen (TYLENOL) 325 MG tablet Take 2 tablets (650 mg total) by mouth every 6 (six) hours as needed for mild pain (or Fever >/= 101). 08/26/20   Russella Dar, NP  aspirin 325 MG EC tablet Take 1 tablet (325 mg total) by mouth daily. 08/27/20   Russella Dar, NP  atorvastatin (LIPITOR) 40 MG tablet Take 1 tablet (40 mg total) by mouth daily. 08/27/20   Russella Dar, NP  Baclofen 5 MG TABS Take 5 mg by mouth 3 (three) times daily. 08/26/20   Russella Dar, NP  ferrous sulfate 325 (65 FE) MG tablet Take 1 tablet (325 mg total) by mouth 2 (two) times daily with a meal. 08/26/20   Russella Dar, NP  folic acid (FOLVITE) 1 MG tablet Take 1 tablet (1 mg total) by mouth daily. 08/27/20   Russella Dar, NP  pregabalin (LYRICA) 150 MG capsule Take 1 capsule (150 mg total) by mouth 3 (three)  times daily. 08/26/20   Russella Dar, NP  QUEtiapine (SEROQUEL) 25 MG tablet Take 1 tablet (25 mg total) by mouth at bedtime as needed (sleep). 08/26/20   Russella Dar, NP  senna-docusate (SENOKOT-S) 8.6-50 MG tablet Take 2 tablets by mouth 2 (two) times daily. 08/26/20   Russella Dar, NP  thiamine 100 MG tablet Take 1 tablet (100 mg total) by mouth daily. 08/27/20   Russella Dar, NP    Allergies    Pork-derived products  Review of Systems   Review of Systems  Unable to perform ROS: Other   Physical Exam Updated Vital Signs BP 111/69   Pulse 68   Temp 98.4 F (36.9 C) (Oral)   Resp 16   SpO2 94%   Physical Exam Vitals and nursing note reviewed.  Constitutional:      General: He is not  in acute distress.    Appearance: Normal appearance. He is not ill-appearing, toxic-appearing or diaphoretic.  HENT:     Head: Normocephalic and atraumatic.     Right Ear: External ear normal.     Left Ear: External ear normal.     Nose: Nose normal.     Mouth/Throat:     Mouth: Mucous membranes are moist.     Pharynx: Oropharynx is clear. No oropharyngeal exudate or posterior oropharyngeal erythema.  Eyes:     Extraocular Movements: Extraocular movements intact.  Cardiovascular:     Rate and Rhythm: Normal rate and regular rhythm.     Pulses: Normal pulses.     Heart sounds: Normal heart sounds. No murmur heard.   No friction rub. No gallop.  Pulmonary:     Effort: Pulmonary effort is normal. No respiratory distress.     Breath sounds: Normal breath sounds. No stridor. No wheezing, rhonchi or rales.  Abdominal:     General: Abdomen is flat.     Tenderness: There is no abdominal tenderness.  Musculoskeletal:        General: Normal range of motion.     Cervical back: Normal range of motion and neck supple. No tenderness.  Skin:    General: Skin is warm and dry.  Neurological:     General: No focal deficit present.     Comments: Moving all four extremities. Appears fatigued. A&O x 2. Believes it is currently 2011.   Psychiatric:        Attention and Perception: He is inattentive.        Mood and Affect: Mood is depressed.        Speech: Speech is delayed and slurred.        Behavior: Behavior is slowed and withdrawn. Behavior is not agitated or aggressive. Behavior is cooperative.    ED Results / Procedures / Treatments   Labs (all labs ordered are listed, but only abnormal results are displayed) Labs Reviewed  COMPREHENSIVE METABOLIC PANEL - Abnormal; Notable for the following components:      Result Value   Chloride 96 (*)    Glucose, Bld 103 (*)    BUN <5 (*)    Creatinine, Ser 0.48 (*)    Calcium 8.5 (*)    Total Protein 8.9 (*)    AST 52 (*)    All other  components within normal limits  CBC WITH DIFFERENTIAL/PLATELET - Abnormal; Notable for the following components:   RDW 18.3 (*)    Platelets 83 (*)    All other components within normal limits  ETHANOL - Abnormal; Notable  for the following components:   Alcohol, Ethyl (B) 318 (*)    All other components within normal limits  CBG MONITORING, ED   EKG None  Radiology No results found.  Procedures Procedures   Medications Ordered in ED Medications  sodium chloride 0.9 % bolus 1,000 mL (0 mLs Intravenous Stopped 10/01/20 1418)  thiamine (B-1) injection 100 mg (100 mg Intravenous Given 10/01/20 1143)    ED Course  I have reviewed the triage vital signs and the nursing notes.  Pertinent labs & imaging results that were available during my care of the patient were reviewed by me and considered in my medical decision making (see chart for details).  Clinical Course as of 10/02/20 1535  Thu Oct 01, 2020  1356 Patient reassessed.  Much more alert.  Ambulatory.  We will give patient a sandwich and something to drink. [LJ]    Clinical Course User Index [LJ] Placido Sou, PA-C   MDM Rules/Calculators/A&P                          Pt is a 55 y.o. male who presents to the emergency department via [employer] due to alcohol intoxication.  Patient has a history of alcohol abuse and homelessness.  Labs: CBC with an RDW of 18.3 and platelets of 83. CMP with a chloride of 96, glucose of 103, BUN less than 5, creatinine of 0.48, calcium of 8.5, total protein of 8.9, AST of 52. CBG of 89. Ethanol 318.  I, Placido Sou, PA-C, personally reviewed and evaluated these images and lab results as part of my medical decision-making.  Patient reassessed on multiple occasions.  After IV fluids and thiamine patient appears much better oriented.  Now alert, awake, and ambulating throughout the emergency department.  He was given a sandwich as well as something to drink and tolerated p.o. without  difficulty.  Nursing staff notified me that patient eloped from the emergency department.  They attempted to bring patient back to the emergency department but he refused.  He removed his own IV and left the premises.  Note: Portions of this report may have been transcribed using voice recognition software. Every effort was made to ensure accuracy; however, inadvertent computerized transcription errors may be present.    Final Clinical Impression(s) / ED Diagnoses Final diagnoses:  Alcoholic intoxication with complication [facility])  Homeless   Rx / DC Orders ED Discharge Orders     None        Placido Sou, PA-C 10/02/20 1537    Koleen Distance, MD 10/05/20 [phone]

## 2020-10-01 NOTE — ED Notes (Addendum)
Pt wheeled to the restroom in his personal wheelchair and then disappeared. Pt found in the parking lot. Attempted to have pt come back in to be seen, but refused. Pt took out his IV. Made PA aware.

## 2020-11-14 ENCOUNTER — Other Ambulatory Visit: Payer: Self-pay

## 2020-11-14 ENCOUNTER — Emergency Department (HOSPITAL_COMMUNITY): Payer: Medicaid Other

## 2020-11-14 ENCOUNTER — Observation Stay (HOSPITAL_COMMUNITY)
Admission: EM | Admit: 2020-11-14 | Discharge: 2020-11-14 | DRG: 564 | Discharge status: Against Medical Advice | Payer: Medicaid Other | Attending: Internal Medicine | Admitting: Internal Medicine

## 2020-11-14 DIAGNOSIS — Z5900 Homelessness unspecified: Secondary | ICD-10-CM | POA: Diagnosis not present

## 2020-11-14 DIAGNOSIS — Y907 Blood alcohol level of 200-239 mg/100 ml: Secondary | ICD-10-CM | POA: Diagnosis present

## 2020-11-14 DIAGNOSIS — Z79899 Other long term (current) drug therapy: Secondary | ICD-10-CM | POA: Diagnosis not present

## 2020-11-14 DIAGNOSIS — G9341 Metabolic encephalopathy: Secondary | ICD-10-CM

## 2020-11-14 DIAGNOSIS — I739 Peripheral vascular disease, unspecified: Secondary | ICD-10-CM | POA: Diagnosis present

## 2020-11-14 DIAGNOSIS — F1010 Alcohol abuse, uncomplicated: Secondary | ICD-10-CM | POA: Diagnosis not present

## 2020-11-14 DIAGNOSIS — Z20822 Contact with and (suspected) exposure to covid-19: Secondary | ICD-10-CM | POA: Diagnosis not present

## 2020-11-14 DIAGNOSIS — I959 Hypotension, unspecified: Secondary | ICD-10-CM | POA: Diagnosis not present

## 2020-11-14 DIAGNOSIS — A419 Sepsis, unspecified organism: Secondary | ICD-10-CM

## 2020-11-14 DIAGNOSIS — E785 Hyperlipidemia, unspecified: Secondary | ICD-10-CM | POA: Diagnosis not present

## 2020-11-14 DIAGNOSIS — Z7982 Long term (current) use of aspirin: Secondary | ICD-10-CM | POA: Diagnosis not present

## 2020-11-14 DIAGNOSIS — T8744 Infection of amputation stump, left lower extremity: Secondary | ICD-10-CM | POA: Diagnosis present

## 2020-11-14 DIAGNOSIS — L089 Local infection of the skin and subcutaneous tissue, unspecified: Secondary | ICD-10-CM

## 2020-11-14 DIAGNOSIS — Z5329 Procedure and treatment not carried out because of patient's decision for other reasons: Secondary | ICD-10-CM | POA: Diagnosis present

## 2020-11-14 DIAGNOSIS — F17210 Nicotine dependence, cigarettes, uncomplicated: Secondary | ICD-10-CM | POA: Diagnosis present

## 2020-11-14 DIAGNOSIS — Z91018 Allergy to other foods: Secondary | ICD-10-CM | POA: Diagnosis not present

## 2020-11-14 DIAGNOSIS — R000 Tachycardia, unspecified: Secondary | ICD-10-CM | POA: Diagnosis not present

## 2020-11-14 DIAGNOSIS — Y835 Amputation of limb(s) as the cause of abnormal reaction of the patient, or of later complication, without mention of misadventure at the time of the procedure: Secondary | ICD-10-CM | POA: Diagnosis present

## 2020-11-14 DIAGNOSIS — R509 Fever, unspecified: Secondary | ICD-10-CM | POA: Diagnosis not present

## 2020-11-14 DIAGNOSIS — R001 Bradycardia, unspecified: Secondary | ICD-10-CM | POA: Diagnosis not present

## 2020-11-14 DIAGNOSIS — R404 Transient alteration of awareness: Secondary | ICD-10-CM | POA: Diagnosis not present

## 2020-11-14 DIAGNOSIS — R4182 Altered mental status, unspecified: Secondary | ICD-10-CM | POA: Diagnosis present

## 2020-11-14 HISTORY — DX: Sepsis, unspecified organism: A41.9

## 2020-11-14 HISTORY — DX: Metabolic encephalopathy: G93.41

## 2020-11-14 LAB — CBC WITH DIFFERENTIAL/PLATELET
Abs Immature Granulocytes: 0.03 10*3/uL (ref 0.00–0.07)
Basophils Absolute: 0.1 10*3/uL (ref 0.0–0.1)
Basophils Relative: 1 %
Eosinophils Absolute: 0.2 10*3/uL (ref 0.0–0.5)
Eosinophils Relative: 3 %
HCT: 41.4 % (ref 39.0–52.0)
Hemoglobin: 13.5 g/dL (ref 13.0–17.0)
Immature Granulocytes: 1 %
Lymphocytes Relative: 29 %
Lymphs Abs: 1.8 10*3/uL (ref 0.7–4.0)
MCH: 27.6 pg (ref 26.0–34.0)
MCHC: 32.6 g/dL (ref 30.0–36.0)
MCV: 84.7 fL (ref 80.0–100.0)
Monocytes Absolute: 0.6 10*3/uL (ref 0.1–1.0)
Monocytes Relative: 10 %
Neutro Abs: 3.4 10*3/uL (ref 1.7–7.7)
Neutrophils Relative %: 56 %
Platelets: 153 10*3/uL (ref 150–400)
RBC: 4.89 MIL/uL (ref 4.22–5.81)
RDW: 17.4 % — ABNORMAL HIGH (ref 11.5–15.5)
WBC: 6 10*3/uL (ref 4.0–10.5)
nRBC: 0 % (ref 0.0–0.2)

## 2020-11-14 LAB — COMPREHENSIVE METABOLIC PANEL
ALT: 21 U/L (ref 0–44)
AST: 35 U/L (ref 15–41)
Albumin: 3 g/dL — ABNORMAL LOW (ref 3.5–5.0)
Alkaline Phosphatase: 66 U/L (ref 38–126)
Anion gap: 13 (ref 5–15)
BUN: <5 mg/dL — ABNORMAL LOW (ref 6–20)
CO2: 28 mmol/L (ref 22–32)
Calcium: 8 mg/dL — ABNORMAL LOW (ref 8.9–10.3)
Chloride: 95 mmol/L — ABNORMAL LOW (ref 98–111)
Creatinine, Ser: 0.52 mg/dL — ABNORMAL LOW (ref 0.61–1.24)
GFR, Estimated: >60 mL/min (ref >60)
Glucose, Bld: 105 mg/dL — ABNORMAL HIGH (ref 70–99)
Potassium: 3.5 mmol/L (ref 3.5–5.1)
Sodium: 136 mmol/L (ref 135–145)
Total Bilirubin: 0.3 mg/dL (ref 0.3–1.2)
Total Protein: 7.8 g/dL (ref 6.5–8.1)

## 2020-11-14 LAB — RESP PANEL BY RT-PCR (FLU A&B, COVID) ARPGX2
Influenza A by PCR: NEGATIVE
Influenza B by PCR: NEGATIVE
SARS Coronavirus 2 by RT PCR: NEGATIVE

## 2020-11-14 LAB — ETHANOL: Alcohol, Ethyl (B): 223 mg/dL — ABNORMAL HIGH (ref <10)

## 2020-11-14 MED ORDER — VANCOMYCIN HCL IN DEXTROSE 1-5 GM/200ML-% IV SOLN
1000.0000 mg | Freq: Once | INTRAVENOUS | Status: AC
  Administered 2020-11-14: 1000 mg via INTRAVENOUS
  Filled 2020-11-14: qty 200

## 2020-11-14 MED ORDER — PIPERACILLIN-TAZOBACTAM 3.375 G IVPB 30 MIN
3.3750 g | Freq: Once | INTRAVENOUS | Status: AC
  Administered 2020-11-14: 3.375 g via INTRAVENOUS
  Filled 2020-11-14: qty 50

## 2020-11-14 MED ORDER — SODIUM CHLORIDE 0.9 % IV SOLN: INTRAVENOUS | Status: DC

## 2020-11-14 NOTE — Discharge Summary (Signed)
AMA Discharge   Notified by nursing that the patient is leaving AMA. He has been counseled by multiple medical staff of the seriousness of his condition. He still wishes to leave. Similar episode and decision made by the patient on previous admission 07/17/20. AMA paperwork has been signed. He has left AMA.   Final Dx: Sepsis secondary to LLE wound  Teddy Spike, DO

## 2020-11-14 NOTE — Progress Notes (Signed)
Elink monitoring for sepsis protocol 

## 2020-11-14 NOTE — ED Provider Notes (Signed)
[facility]-EMERGENCY DEPT Provider Note   CSN: [id number] Arrival date & time: 11/14/20  0801     History No chief complaint on file.   RYLAN BERNARD is a 55 y.o. male.  HPI Patient presents via EMS with concern for maggots in his wound, possible infection, and altered mental status.  The patient himself sleeping, awakens easily, denies pain, but falls asleep again quickly, level 5 caveat secondary to cognitive status/acuity of condition.  Per EMS the patient was observed to be listless, unresponsive for about 1 hour, and EMS was notified.  EMS staff note that the patient was seen and evaluated by fire department staff at some point during evening but declined care.  Upon evaluating the patient they found him to be febrile, 100.4, and with a grossly infected left stump wound he was brought here for evaluation.  In route to the patient was not hypotensive, but was noninteractive. EMS reports no evidence of trauma, fall, but the patient was soaking, with smell of alcohol, possibly urine.    Past Medical History:  Diagnosis Date   Alcoholism [facility])     Patient Active Problem List   Diagnosis Date Noted   Acute metabolic encephalopathy 11/14/2020   S/P BKA (below knee amputation) unilateral, left (HCC)    Neuropathic pain, leg, left    Gangrene (HCC) 07/31/2020   Gangrene of foot (HCC) 07/14/2020   Ataxia    Alcohol abuse with alcohol-induced mood disorder (HCC) 11/10/2019   Alcoholism /alcohol abuse    Alcohol withdrawal syndrome, with delirium (HCC) 11/06/2019   Pressure injury of skin 05/25/2018   Rhabdomyolysis 05/20/2018   Hyponatremia 05/20/2018   Elevated troponin 05/20/2018   Moderate dehydration 05/20/2018   Alcohol use 05/20/2018   Influenza A 05/20/2018   Transaminitis 05/20/2018    Past Surgical History:  Procedure Laterality Date   ABDOMINAL AORTOGRAM W/LOWER EXTREMITY N/A 08/04/2020   Procedure: ABDOMINAL AORTOGRAM W/LOWER EXTREMITY;   Surgeon: Nada Libman, MD;  [facility]: [facility] INVASIVE CV LAB;  Service: Cardiovascular;  Laterality: N/A;   AMPUTATION Left 08/05/2020   Procedure: AMPUTATION BELOW KNEE;  Surgeon: Nada Libman, MD;  Location: [facility] OR;  Service: Vascular;  Laterality: Left;       Family History  Family history unknown: Yes    Social History   Tobacco Use   Smoking status: Every Day    Packs/day: 0.50    Types: Cigarettes   Smokeless tobacco: Never  Substance Use Topics   Alcohol use: Yes    Home Medications Prior to Admission medications   Medication Sig Start Date End Date Taking? Authorizing Provider  acetaminophen (TYLENOL) 325 MG tablet Take 2 tablets (650 mg total) by mouth every 6 (six) hours as needed for mild pain (or Fever >/= 101). 08/26/20   Russella Dar, NP  aspirin 325 MG EC tablet Take 1 tablet (325 mg total) by mouth daily. 08/27/20   Russella Dar, NP  atorvastatin (LIPITOR) 40 MG tablet Take 1 tablet (40 mg total) by mouth daily. 08/27/20   Russella Dar, NP  Baclofen 5 MG TABS Take 5 mg by mouth 3 (three) times daily. 08/26/20   Russella Dar, NP  ferrous sulfate 325 (65 FE) MG tablet Take 1 tablet (325 mg total) by mouth 2 (two) times daily with a meal. 08/26/20   Russella Dar, NP  folic acid (FOLVITE) 1 MG tablet Take 1 tablet (1 mg total) by mouth daily. 08/27/20   Rennis Harding,  Kelle Darting, NP  pregabalin (LYRICA) 150 MG capsule Take 1 capsule (150 mg total) by mouth 3 (three) times daily. 08/26/20   Russella Dar, NP  QUEtiapine (SEROQUEL) 25 MG tablet Take 1 tablet (25 mg total) by mouth at bedtime as needed (sleep). 08/26/20   Russella Dar, NP  senna-docusate (SENOKOT-S) 8.6-50 MG tablet Take 2 tablets by mouth 2 (two) times daily. 08/26/20   Russella Dar, NP  thiamine 100 MG tablet Take 1 tablet (100 mg total) by mouth daily. 08/27/20   Russella Dar, NP    Allergies    Pork-derived products  Review of Systems   Review of Systems  Unable to perform ROS:  Mental status change   Physical Exam Updated Vital Signs BP 107/66   Pulse 95   Temp 98.6 F (37 C) (Oral)   Resp 18   SpO2 96%   Physical Exam Vitals and nursing note reviewed.  Constitutional:      Appearance: He is well-developed.     Comments: Disheveled, foul-smelling thin adult male withdrawn, awakens to stimuli, briefly falls asleep again quickly  HENT:     Head: Normocephalic and atraumatic.  Eyes:     Conjunctiva/sclera: Conjunctivae normal.  Cardiovascular:     Rate and Rhythm: Normal rate and regular rhythm.  Pulmonary:     Effort: Pulmonary effort is normal. No respiratory distress.     Breath sounds: No stridor.  Abdominal:     General: There is no distension.  Musculoskeletal:     Comments: Left below the knee amputation  Skin:    General: Skin is warm.       Neurological:     Comments: Patient awakens easily, falls asleep again quickly, does not offer any details of his history, nor condition.  Psychiatric:     Comments: Withdrawn    ED Results / Procedures / Treatments   Labs (all labs ordered are listed, but only abnormal results are displayed) Labs Reviewed  CBC WITH DIFFERENTIAL/PLATELET - Abnormal; Notable for the following components:      Result Value   RDW 17.4 (*)    All other components within normal limits  ETHANOL - Abnormal; Notable for the following components:   Alcohol, Ethyl (B) 223 (*)    All other components within normal limits  COMPREHENSIVE METABOLIC PANEL - Abnormal; Notable for the following components:   Chloride 95 (*)    Glucose, Bld 105 (*)    BUN <5 (*)    Creatinine, Ser 0.52 (*)    Calcium 8.0 (*)    Albumin 3.0 (*)    All other components within normal limits  RESP PANEL BY RT-PCR (FLU A&B, COVID) [id number]      Procedures Procedures   Medications Ordered in ED Medications  vancomycin (VANCOCIN) IVPB 1000 mg/200 mL premix (0 mg Intravenous Stopped 11/14/20 1340)  piperacillin-tazobactam (ZOSYN) IVPB 3.375 g  (0 g Intravenous Stopped 11/14/20 1230)    ED Course  I have reviewed the triage vital signs and the nursing notes.  Pertinent labs & imaging results that were available during my care of the patient were reviewed by me and considered in my medical decision making (see chart for details).  With consideration of wound infection versus bacteremia versus sepsis given the patient's withdrawn status, though with other considerations including alcohol intoxication patient had decontamination performed, labs drawn.  3:55 PM I discussed the patient's case with our vascular surgeon on-call, Dr. Juanetta Gosling, and he and I discussed  the patient case at bedside, evaluating the wound.  With substantial erythema, bogginess, open aspects with deep tissue visible, staple still embedded after procedure performed in April of this year patient will start antibiotics, and conversation regarding the necessity of likely AKA initiated. Adult male, homeless, now 4 months status post left BKA presents with grossly infected wound.  Patient had debridement of maggots, decontamination, started on broad-spectrum antibiotics, was admitted to internal medicine colleagues for anticipated further surgical intervention from our vascular surgery team. MDM Rules/Calculators/A&P MDM Number of Diagnoses or Management Options Infected wound: new, needed workup   Amount and/or Complexity of Data Reviewed Clinical lab tests: ordered and reviewed Tests in the radiology section of CPT: ordered and reviewed Tests in the medicine section of CPT: reviewed and ordered Decide to obtain previous medical records or to obtain history from someone other than the patient: yes Obtain history from someone other than the patient: yes Review and summarize past medical records: yes Discuss the patient with other providers: yes Independent visualization of images, tracings, or specimens: yes  Risk of Complications, Morbidity, and/or  Mortality Presenting problems: high Diagnostic procedures: high Management options: high  Critical Care Total time providing critical care: < 30 minutes  Patient Progress Patient progress: stable   Final Clinical Impression(s) / ED Diagnoses Final diagnoses:  Infected wound     Gerhard Munch, MD 11/14/20 1556

## 2020-11-14 NOTE — H&P (Addendum)
History and Physical    Jake Nguyen RJJ:[id number] DOB: 1965/09/26 DOA: 11/14/2020  PCP: Patient, No Pcp Per (Inactive)  Patient coming from: Homeless  Chief Complaint: "I've got those bugs in my leg"  HPI: TALLIE HEVIA is a 55 y.o. male with medical history significant of PAD, EtOH abuse, HLD. Presenting LLE wound infection. The patient had a L BKA back in April 2022. He did not complete outpt follow up after his procedure. He has had his staples in ever since. His wound has dehisced. He has had some unnamed assistance changing bandages for him. He reports that the last couple of times the bandages have been changed there have been maggots in his wound. He was found to be unresponsive this morning. EMS was called. He was found to be febrile w/ an infected stump. He was transported to the ED for evaluation.   ED Course: Ortho was consulted. He was started on vanc and zosyn. TRH was called for admission.   Review of Systems:  Review of systems is otherwise negative for all not mentioned in HPI.   PMHx Past Medical History:  Diagnosis Date   Alcoholism [facility])     PSHx Past Surgical History:  Procedure Laterality Date   ABDOMINAL AORTOGRAM W/LOWER EXTREMITY N/A 08/04/2020   Procedure: ABDOMINAL AORTOGRAM W/LOWER EXTREMITY;  Surgeon: Nada Libman, MD;  [facility]: [facility] IN[facility];  Service: Cardiovascular;  Laterality: N/A;   AMPUTATION Left 08/05/2020   Procedure: AMPUTATION BELOW KNEE;  Surgeon: Nada Libman, MD;  Location: [facility] OR;  Service: Vascular;  Laterality: Left;    SocHx  reports that he has been smoking cigarettes. He has been smoking an average of .5 packs per day. He has never used smokeless tobacco. He reports current alcohol use. No history on file for drug use.  Allergies  Allergen Reactions   Pork-Derived Products Other (See Comments)    Pt states he doesn't eat pork    FamHx Family History  Family history unknown: Yes    Prior to Admission  medications   Medication Sig Start Date End Date Taking? Authorizing Provider  acetaminophen (TYLENOL) 325 MG tablet Take 2 tablets (650 mg total) by mouth every 6 (six) hours as needed for mild pain (or Fever >/= 101). 08/26/20   Russella Dar, NP  aspirin 325 MG EC tablet Take 1 tablet (325 mg total) by mouth daily. 08/27/20   Russella Dar, NP  atorvastatin (LIPITOR) 40 MG tablet Take 1 tablet (40 mg total) by mouth daily. 08/27/20   Russella Dar, NP  Baclofen 5 MG TABS Take 5 mg by mouth 3 (three) times daily. 08/26/20   Russella Dar, NP  ferrous sulfate 325 (65 FE) MG tablet Take 1 tablet (325 mg total) by mouth 2 (two) times daily with a meal. 08/26/20   Russella Dar, NP  folic acid (FOLVITE) 1 MG tablet Take 1 tablet (1 mg total) by mouth daily. 08/27/20   Russella Dar, NP  pregabalin (LYRICA) 150 MG capsule Take 1 capsule (150 mg total) by mouth 3 (three) times daily. 08/26/20   Russella Dar, NP  QUEtiapine (SEROQUEL) 25 MG tablet Take 1 tablet (25 mg total) by mouth at bedtime as needed (sleep). 08/26/20   Russella Dar, NP  senna-docusate (SENOKOT-S) 8.6-50 MG tablet Take 2 tablets by mouth 2 (two) times daily. 08/26/20   Russella Dar, NP  thiamine 100 MG tablet Take 1 tablet (100 mg total)  by mouth daily. 08/27/20   Russella Dar, NP    Physical Exam: Vitals:   11/14/20 0821 11/14/20 1125 11/14/20 1245  BP: (!) 144/99 127/74 107/66  Pulse: 97 95 95  Resp: 18 18 18   Temp: 98.4 F (36.9 C)  98.6 F (37 C)  TempSrc:   Oral  SpO2: 92% 94% 96%    General: 55 y.o. male resting in bed in NAD, disheveled Eyes: PERRL, normal sclera ENMT: Nares patent w/o discharge, orophaynx clear, dentition poor, ears w/o discharge/lesions/ulcers Neck: Supple, trachea midline Cardiovascular: tachy, +S1, S2, no m/g/r, equal pulses throughout Respiratory: CTABL, no w/r/r, normal WOB GI: BS+, NDNT, no masses noted, no organomegaly noted MSK: L BKA w/ open wound, staples  still present, purulent, foul smelling Neuro: A&O x 3, no focal deficits Psyc: Aloof and flat affect, calm  Labs on Admission: I have personally reviewed following labs and imaging studies  CBC: Recent Labs  Lab 11/14/20 1127  WBC 6.0  NEUTROABS 3.4  HGB 13.5  HCT 41.4  MCV 84.7  PLT 153   Basic Metabolic Panel: Recent Labs  Lab 11/14/20 1231  NA 136  K 3.5  CL 95*  CO2 28  GLUCOSE 105*  BUN <5*  CREATININE 0.52*  CALCIUM 8.0*   GFR: CrCl cannot be calculated (Unknown ideal weight.). Liver Function Tests: Recent Labs  Lab 11/14/20 1231  AST 35  ALT 21  ALKPHOS 66  BILITOT 0.3  PROT 7.8  ALBUMIN 3.0*   No results for input(s): LIPASE, AMYLASE in the last 168 hours. No results for input(s): AMMONIA in the last 168 hours. Coagulation Profile: No results for input(s): INR, PROTIME in the last 168 hours. Cardiac Enzymes: No results for input(s): CKTOTAL, CKMB, CKMBINDEX, TROPONINI in the last 168 hours. BNP (last 3 results) No results for input(s): PROBNP in the last 8760 hours. HbA1C: No results for input(s): HGBA1C in the last 72 hours. CBG: No results for input(s): GLUCAP in the last 168 hours. Lipid Profile: No results for input(s): CHOL, HDL, LDLCALC, TRIG, CHOLHDL, LDLDIRECT in the last 72 hours. Thyroid Function Tests: No results for input(s): TSH, T4TOTAL, FREET4, T3FREE, THYROIDAB in the last 72 hours. Anemia Panel: No results for input(s): VITAMINB12, FOLATE, FERRITIN, TIBC, IRON, RETICCTPCT in the last 72 hours. Urine analysis:    Component Value Date/Time   COLORURINE YELLOW 05/20/2018 1458   APPEARANCEUR CLEAR 05/20/2018 1458   LABSPEC 1.024 05/20/2018 1458   PHURINE 6.0 05/20/2018 1458   GLUCOSEU NEGATIVE 05/20/2018 1458   HGBUR LARGE (A) 05/20/2018 1458   BILIRUBINUR NEGATIVE 05/20/2018 1458   KETONESUR 80 (A) 05/20/2018 1458   PROTEINUR >=300 (A) 05/20/2018 1458   UROBILINOGEN 0.2 08/02/2009 2144   NITRITE NEGATIVE 05/20/2018 1458    LEUKOCYTESUR NEGATIVE 05/20/2018 1458    Radiological Exams on Admission: No results found.  EKG: Independently reviewed. Sinus tach, no st elevations  Assessment/Plan Sepsis secondary to LLE wound infection     - admit to inpt, tele @ [facility]     - check lactic acid     - check bld Cx     - continue vanc, zosyn     - vasc surg onboard, appreciate assistance     - fluids, follow  PAD HLD     - continue statin when meds confirmed  EtOH abuse     - CIWA     - counseled against further use  Tobacco abuse     - nicotine patch     -  counseled against further use  UPDATE: Notified the patient is leaving AMA.   DVT prophylaxis: lovenox  Code Status: FULL  Family Communication: none at bedside  Consults called: EDP spoke with vascular surgery   Status is: Inpatient  Remains inpatient appropriate because:Inpatient level of care appropriate due to severity of illness  Dispo: The patient is from:  homeless              Anticipated d/c is to:  TBD              Patient currently is not medically stable to d/c.   Difficult to place patient Yes  Time spent coordinating admission: 70 minutes  Wilfredo Canterbury A Catalyna Reilly DO [facility]  If 7PM-7AM, please contact night-coverage [email or web address]  11/14/2020, 2:06 PM

## 2020-11-14 NOTE — ED Notes (Signed)
IV team consult order placed 

## 2020-11-14 NOTE — ED Notes (Signed)
Pt removed IV in R hand by himself.

## 2020-11-14 NOTE — ED Notes (Signed)
Pt did not want vitals taken at this time

## 2020-11-14 NOTE — ED Notes (Signed)
Pt got out of bed and got in wheelchair and was attempting to leave. Pt had pulled IV out and stated he was leaving. DO Tyrone notified and stated to have pt sign ama and let him leave. Writer educated on benefits of staying for medical care but pt still refused and left.

## 2020-11-14 NOTE — ED Notes (Signed)
Pt in bed resting. Respirations even and unlabored. Pt can be uncooperative at times and states he does not want anymore surgeries.

## 2020-11-14 NOTE — ED Notes (Signed)
Pt came in with maggots coming out of recently BKA, Writer and charge cleaned with NS and got some maggots out. Pt is covered in red raised lesions over body. Pt was taken to decon room and washed thoroughly. All pt clothes are soiled and have been placed in a sealed bag in his room along with his wheelchair.

## 2020-11-14 NOTE — ED Notes (Signed)
Maggots removed from patient's wound per MD request.

## 2020-11-14 NOTE — ED Triage Notes (Signed)
Per EMS, Pt homeless, pt was found on side of road, has a hard time staying awake. Infection in left amputated nub. Maggots and flys seen and dripping puss. Foul smell. Hypotensive.  No complaints and told EMS "what is hospital going to do for me".   86/64 HR 60-100 100.4 R20  Alter and oriented X4

## 2020-11-14 NOTE — ED Notes (Signed)
Patient seen wheeling self in wheel chair in ED hall. IV removed by patient. States he is leaving. This RN spoke with Dr. Ronaldo Miyamoto on the phone making him aware. Per MD, have patient sign AMA prior to leaving. Alvino Chapel RN made aware to have patient sign AMA prior to leaving.

## 2020-11-14 NOTE — Consult Note (Signed)
[facility] [place]  ASSESSMENT / PLAN: 55 y.o. male with grossly infected left below knee amputation stump. He will need left above knee amputation.  He is currently not interested in any further surgery.  I explained that he would eventually develop sepsis and die if we did not revise his stump to an above-knee amputation.  I suspect he is intoxicated.  We will check back with him tomorrow to reevaluate.  CHIEF COMPLAINT: Altered mental status  HISTORY OF PRESENT ILLNESS: Jake Nguyen is a 55 y.o. male well-known to me.  I saw him in consultation in late April 2022 for critical limb ischemia.  He underwent a left lower extremity angiogram 4/26 with Dr. Myra Gianotti which revealed unreconstructable peripheral vascular disease.  He was offered left below-knee amputation which was done 08/05/2020 by Dr. Myra Gianotti.  He was lost to follow-up.  He is homeless and a chronic alcoholic.  He was brought in by emergency medical services early this morning with altered mental status, and gross infection of the left amputation stump.  Maggots in purulence were noted in the wound bed.  Past Medical History:  Diagnosis Date   Alcoholism [facility])     Past Surgical History:  Procedure Laterality Date   ABDOMINAL AORTOGRAM W/LOWER EXTREMITY N/A 08/04/2020   Procedure: ABDOMINAL AORTOGRAM W/LOWER EXTREMITY;  Surgeon: Nada Libman, MD;  Location: [facility] INV[facility] CV LAB;  Service: Cardiovascular;  Laterality: N/A;   AMPUTATION Left 08/05/2020   Procedure: AMPUTATION BELOW KNEE;  Surgeon: Nada Libman, MD;  Location: [facility] OR;  Service: Vascular;  Laterality: Left;    Family History  Family history unknown: Yes    Social History   Socioeconomic History   Marital status: Single    Spouse name: Not on file   Number of children: Not on file   Years of education: Not on file   Highest education level: Not on file  Occupational History   Not on file  Tobacco Use   Smoking status: Every  Day    Packs/day: 0.50    Types: Cigarettes   Smokeless tobacco: Never  Substance and Sexual Activity   Alcohol use: Yes   Drug use: Not on file   Sexual activity: Not Currently  Other Topics Concern   Not on file  Social History Narrative   Not on file   Social Determinants of Health   Financial Resource Strain: Not on file  Food Insecurity: Not on file  Transportation Needs: Not on file  Physical Activity: Not on file  Stress: Not on file  Social Connections: Not on file  Intimate Partner Violence: Not on file    Allergies  Allergen Reactions   Pork-Derived Products Other (See Comments)    Pt states he doesn't eat pork    No current facility-administered medications for this encounter.   Current Outpatient Medications  Medication Sig Dispense Refill   acetaminophen (TYLENOL) 325 MG tablet Take 2 tablets (650 mg total) by mouth every 6 (six) hours as needed for mild pain (or Fever >/= 101).     aspirin 325 MG EC tablet Take 1 tablet (325 mg total) by mouth daily. 30 tablet 0   atorvastatin (LIPITOR) 40 MG tablet Take 1 tablet (40 mg total) by mouth daily. 30 tablet 3   Baclofen 5 MG TABS Take 5 mg by mouth 3 (three) times daily. 90 tablet 3   ferrous sulfate 325 (65 FE) MG tablet Take 1 tablet (325 mg total) by mouth  2 (two) times daily with a meal. 60 tablet 3   folic acid (FOLVITE) 1 MG tablet Take 1 tablet (1 mg total) by mouth daily. 30 tablet 3   pregabalin (LYRICA) 150 MG capsule Take 1 capsule (150 mg total) by mouth 3 (three) times daily. 90 capsule 3   QUEtiapine (SEROQUEL) 25 MG tablet Take 1 tablet (25 mg total) by mouth at bedtime as needed (sleep). 30 tablet 1   senna-docusate (SENOKOT-S) 8.6-50 MG tablet Take 2 tablets by mouth 2 (two) times daily. 60 tablet 3   thiamine 100 MG tablet Take 1 tablet (100 mg total) by mouth daily. 30 tablet 3    REVIEW OF SYSTEMS:  Unable to obtain - altered mental status  PHYSICAL EXAM Vitals:   11/14/20 0821  BP: (!)  144/99  Pulse: 97  Resp: 18  Temp: 98.4 F (36.9 C)  SpO2: 92%   Constitutional: Chronically ill.  Somnolent.  Wakes with prompting.  Appears marginally nourished.  Neurologic: Grossly intact.  Not participatory. Psychiatric: Not participatory. Eyes:  No icterus. No conjunctival pallor. Ears, nose, throat:  mucous membranes moist. Midline trachea.  Cardiac: regular rate and rhythm.  Respiratory: unlabored. Abdominal: soft, non-tender, non-distended.  Peripheral vascular: Left below-knee amputation stump grossly infected.  Staples still in place from this April.  Entire stump is erythematous and edematous.  Large gaping wound with fibrinous exudate at its base. Extremity: As above Skin: As above Lymphatic: no Stemmer's sign. no palpable lymphadenopathy.  PERTINENT LABORATORY AND RADIOLOGIC DATA  Most recent CBC CBC Latest Ref Rng & Units 10/01/2020 08/24/2020 08/14/2020  WBC 4.0 - 10.5 K/uL 4.1 9.1 9.5  Hemoglobin 13.0 - 17.0 g/dL 72.5 9.0(L) 7.7(L)  Hematocrit 39.0 - 52.0 % 42.7 29.3(L) 24.6(L)  Platelets 150 - 400 K/uL 83(L) 420(H) 602(H)     Most recent CMP CMP Latest Ref Rng & Units 10/01/2020 08/24/2020 08/14/2020  Glucose 70 - 99 mg/dL 366(Y) 403(K) 742(V)  BUN 6 - 20 mg/dL <9(D) 6 7  Creatinine 6.38 - 1.24 mg/dL 7.56(E) 3.32(R) 5.18(A)  Sodium 135 - 145 mmol/L 137 135 133(L)  Potassium 3.5 - 5.1 mmol/L 3.5 3.8 3.8  Chloride 98 - 111 mmol/L 96(L) 98 98  CO2 22 - 32 mmol/L 28 30 30   Calcium 8.9 - 10.3 mg/dL ) 9.0 4.1(Y)  Total Protein 6.5 - 8.1 g/dL 8.9(H) - -  Total Bilirubin 0.3 - 1.2 mg/dL 0.5 - -  Alkaline Phos 38 - 126 U/L 63 - -  AST 15 - 41 U/L 52(H) - -  ALT 0 - 44 U/L 25 - -    Renal function CrCl cannot be calculated (Patient's most recent lab result is older than the maximum 21 days allowed.).  Hgb A1c MFr Bld (%)  Date Value  07/31/2020 6.6 (H)    LDL Cholesterol  Date Value Ref Range Status  07/31/2020 51 0 - 99 mg/dL Final    Comment:            Total Cholesterol/HDL:CHD Risk Coronary Heart Disease Risk Table                     Men   Women  1/2 Average Risk   3.4   3.3  Average Risk       5.0   4.4  2 X Average Risk   9.6   7.1  3 X Average Risk  23.4   11.0        Use the  calculated Patient Ratio above and the CHD Risk Table to determine the patient's CHD Risk.        ATP III CLASSIFICATION (LDL):  <100     mg/dL   Optimal  767-209  mg/dL   Near or Above                    Optimal  130-159  mg/dL   Borderline  470-962  mg/dL   High  >836     mg/dL   Very High Performed at [facility], [address] [place], [place] [address]     Rande Brunt. Lenell Antu, MD [facility] Phone Number: [phone] 11/14/2020 10:36 AM

## 2020-11-19 ENCOUNTER — Other Ambulatory Visit: Payer: Self-pay

## 2020-11-19 ENCOUNTER — Emergency Department (HOSPITAL_COMMUNITY): Payer: Medicaid Other

## 2020-11-19 ENCOUNTER — Inpatient Hospital Stay (HOSPITAL_COMMUNITY): Payer: Medicaid Other

## 2020-11-19 ENCOUNTER — Encounter (HOSPITAL_COMMUNITY): Payer: Self-pay | Admitting: Family Medicine

## 2020-11-19 ENCOUNTER — Inpatient Hospital Stay (HOSPITAL_COMMUNITY)
Admission: EM | Admit: 2020-11-19 | Discharge: 2020-11-26 | DRG: 474 | Disposition: A | Discharge status: Skilled Nursing Facility | Payer: Medicaid Other | Attending: Family Medicine | Admitting: Family Medicine

## 2020-11-19 DIAGNOSIS — T8743 Infection of amputation stump, right lower extremity: Secondary | ICD-10-CM | POA: Diagnosis not present

## 2020-11-19 DIAGNOSIS — Z5900 Homelessness unspecified: Secondary | ICD-10-CM | POA: Diagnosis not present

## 2020-11-19 DIAGNOSIS — B999 Unspecified infectious disease: Secondary | ICD-10-CM

## 2020-11-19 DIAGNOSIS — Z0189 Encounter for other specified special examinations: Secondary | ICD-10-CM

## 2020-11-19 DIAGNOSIS — F1020 Alcohol dependence, uncomplicated: Secondary | ICD-10-CM | POA: Diagnosis present

## 2020-11-19 DIAGNOSIS — F1091 Alcohol use, unspecified, in remission: Secondary | ICD-10-CM

## 2020-11-19 DIAGNOSIS — Z20822 Contact with and (suspected) exposure to covid-19: Secondary | ICD-10-CM | POA: Diagnosis present

## 2020-11-19 DIAGNOSIS — F10239 Alcohol dependence with withdrawal, unspecified: Secondary | ICD-10-CM | POA: Diagnosis present

## 2020-11-19 DIAGNOSIS — T8789 Other complications of amputation stump: Secondary | ICD-10-CM | POA: Diagnosis present

## 2020-11-19 DIAGNOSIS — Z79899 Other long term (current) drug therapy: Secondary | ICD-10-CM | POA: Diagnosis not present

## 2020-11-19 DIAGNOSIS — R0689 Other abnormalities of breathing: Secondary | ICD-10-CM | POA: Diagnosis not present

## 2020-11-19 DIAGNOSIS — Y906 Blood alcohol level of 120-199 mg/100 ml: Secondary | ICD-10-CM | POA: Diagnosis present

## 2020-11-19 DIAGNOSIS — A498 Other bacterial infections of unspecified site: Secondary | ICD-10-CM

## 2020-11-19 DIAGNOSIS — E876 Hypokalemia: Secondary | ICD-10-CM | POA: Diagnosis not present

## 2020-11-19 DIAGNOSIS — T8744 Infection of amputation stump, left lower extremity: Secondary | ICD-10-CM | POA: Diagnosis not present

## 2020-11-19 DIAGNOSIS — M7989 Other specified soft tissue disorders: Secondary | ICD-10-CM | POA: Diagnosis not present

## 2020-11-19 DIAGNOSIS — Y835 Amputation of limb(s) as the cause of abnormal reaction of the patient, or of later complication, without mention of misadventure at the time of the procedure: Secondary | ICD-10-CM | POA: Diagnosis present

## 2020-11-19 DIAGNOSIS — I70202 Unspecified atherosclerosis of native arteries of extremities, left leg: Secondary | ICD-10-CM | POA: Diagnosis present

## 2020-11-19 DIAGNOSIS — L89152 Pressure ulcer of sacral region, stage 2: Secondary | ICD-10-CM | POA: Diagnosis not present

## 2020-11-19 DIAGNOSIS — F17210 Nicotine dependence, cigarettes, uncomplicated: Secondary | ICD-10-CM | POA: Diagnosis present

## 2020-11-19 DIAGNOSIS — T50916A Underdosing of multiple unspecified drugs, medicaments and biological substances, initial encounter: Secondary | ICD-10-CM | POA: Diagnosis present

## 2020-11-19 DIAGNOSIS — R000 Tachycardia, unspecified: Secondary | ICD-10-CM | POA: Diagnosis not present

## 2020-11-19 DIAGNOSIS — A4150 Gram-negative sepsis, unspecified: Secondary | ICD-10-CM | POA: Diagnosis present

## 2020-11-19 DIAGNOSIS — E785 Hyperlipidemia, unspecified: Secondary | ICD-10-CM | POA: Diagnosis present

## 2020-11-19 DIAGNOSIS — R7881 Bacteremia: Secondary | ICD-10-CM

## 2020-11-19 DIAGNOSIS — Z9114 Patient's other noncompliance with medication regimen: Secondary | ICD-10-CM | POA: Diagnosis not present

## 2020-11-19 DIAGNOSIS — Z9119 Patient's noncompliance with other medical treatment and regimen: Secondary | ICD-10-CM | POA: Diagnosis not present

## 2020-11-19 DIAGNOSIS — A419 Sepsis, unspecified organism: Secondary | ICD-10-CM | POA: Diagnosis not present

## 2020-11-19 DIAGNOSIS — M869 Osteomyelitis, unspecified: Secondary | ICD-10-CM | POA: Diagnosis present

## 2020-11-19 DIAGNOSIS — R0902 Hypoxemia: Secondary | ICD-10-CM | POA: Diagnosis not present

## 2020-11-19 DIAGNOSIS — F419 Anxiety disorder, unspecified: Secondary | ICD-10-CM | POA: Diagnosis present

## 2020-11-19 DIAGNOSIS — L03116 Cellulitis of left lower limb: Secondary | ICD-10-CM | POA: Diagnosis not present

## 2020-11-19 DIAGNOSIS — R5381 Other malaise: Secondary | ICD-10-CM | POA: Diagnosis present

## 2020-11-19 DIAGNOSIS — Z7982 Long term (current) use of aspirin: Secondary | ICD-10-CM | POA: Diagnosis not present

## 2020-11-19 DIAGNOSIS — F10939 Alcohol use, unspecified with withdrawal, unspecified: Secondary | ICD-10-CM

## 2020-11-19 DIAGNOSIS — R6520 Severe sepsis without septic shock: Secondary | ICD-10-CM | POA: Diagnosis not present

## 2020-11-19 DIAGNOSIS — Z1389 Encounter for screening for other disorder: Secondary | ICD-10-CM

## 2020-11-19 DIAGNOSIS — Z89512 Acquired absence of left leg below knee: Secondary | ICD-10-CM | POA: Diagnosis not present

## 2020-11-19 DIAGNOSIS — Z91138 Patient's unintentional underdosing of medication regimen for other reason: Secondary | ICD-10-CM

## 2020-11-19 DIAGNOSIS — Z89511 Acquired absence of right leg below knee: Secondary | ICD-10-CM

## 2020-11-19 DIAGNOSIS — E871 Hypo-osmolality and hyponatremia: Secondary | ICD-10-CM | POA: Diagnosis not present

## 2020-11-19 DIAGNOSIS — R21 Rash and other nonspecific skin eruption: Secondary | ICD-10-CM | POA: Diagnosis present

## 2020-11-19 DIAGNOSIS — F32A Depression, unspecified: Secondary | ICD-10-CM | POA: Diagnosis present

## 2020-11-19 DIAGNOSIS — R001 Bradycardia, unspecified: Secondary | ICD-10-CM | POA: Diagnosis not present

## 2020-11-19 DIAGNOSIS — F10230 Alcohol dependence with withdrawal, uncomplicated: Secondary | ICD-10-CM | POA: Diagnosis not present

## 2020-11-19 HISTORY — DX: Depression, unspecified: F32.A

## 2020-11-19 HISTORY — DX: Anxiety disorder, unspecified: F41.9

## 2020-11-19 LAB — ACETAMINOPHEN LEVEL: Acetaminophen (Tylenol), Serum: <10 ug/mL — ABNORMAL LOW (ref 10–30)

## 2020-11-19 LAB — CBC WITH DIFFERENTIAL/PLATELET
Abs Immature Granulocytes: 0.02 10*3/uL (ref 0.00–0.07)
Basophils Absolute: 0.1 10*3/uL (ref 0.0–0.1)
Basophils Relative: 1 %
Eosinophils Absolute: 0.2 10*3/uL (ref 0.0–0.5)
Eosinophils Relative: 3 %
HCT: 40.9 % (ref 39.0–52.0)
Hemoglobin: 13.1 g/dL (ref 13.0–17.0)
Immature Granulocytes: 0 %
Lymphocytes Relative: 28 %
Lymphs Abs: 1.6 10*3/uL (ref 0.7–4.0)
MCH: 27.2 pg (ref 26.0–34.0)
MCHC: 32 g/dL (ref 30.0–36.0)
MCV: 84.9 fL (ref 80.0–100.0)
Monocytes Absolute: 0.6 10*3/uL (ref 0.1–1.0)
Monocytes Relative: 10 %
Neutro Abs: 3.3 10*3/uL (ref 1.7–7.7)
Neutrophils Relative %: 58 %
Platelets: 188 10*3/uL (ref 150–400)
RBC: 4.82 MIL/uL (ref 4.22–5.81)
RDW: 16.1 % — ABNORMAL HIGH (ref 11.5–15.5)
WBC: 5.7 10*3/uL (ref 4.0–10.5)
nRBC: 0 % (ref 0.0–0.2)

## 2020-11-19 LAB — COMPREHENSIVE METABOLIC PANEL
ALT: 23 U/L (ref 0–44)
AST: 37 U/L (ref 15–41)
Albumin: 3 g/dL — ABNORMAL LOW (ref 3.5–5.0)
Alkaline Phosphatase: 67 U/L (ref 38–126)
Anion gap: 11 (ref 5–15)
BUN: <5 mg/dL — ABNORMAL LOW (ref 6–20)
CO2: 27 mmol/L (ref 22–32)
Calcium: 8.2 mg/dL — ABNORMAL LOW (ref 8.9–10.3)
Chloride: 98 mmol/L (ref 98–111)
Creatinine, Ser: 0.56 mg/dL — ABNORMAL LOW (ref 0.61–1.24)
GFR, Estimated: >60 mL/min (ref >60)
Glucose, Bld: 110 mg/dL — ABNORMAL HIGH (ref 70–99)
Potassium: 3.4 mmol/L — ABNORMAL LOW (ref 3.5–5.1)
Sodium: 136 mmol/L (ref 135–145)
Total Bilirubin: 0.3 mg/dL (ref 0.3–1.2)
Total Protein: 8.4 g/dL — ABNORMAL HIGH (ref 6.5–8.1)

## 2020-11-19 LAB — PHOSPHORUS: Phosphorus: 3.3 mg/dL (ref 2.5–4.6)

## 2020-11-19 LAB — APTT: aPTT: 30 seconds (ref 24–36)

## 2020-11-19 LAB — PROTIME-INR
INR: 1 (ref 0.8–1.2)
Prothrombin Time: 13.2 seconds (ref 11.4–15.2)

## 2020-11-19 LAB — RESP PANEL BY RT-PCR (FLU A&B, COVID) ARPGX2
Influenza A by PCR: NEGATIVE
Influenza B by PCR: NEGATIVE
SARS Coronavirus 2 by RT PCR: NEGATIVE

## 2020-11-19 LAB — ETHANOL: Alcohol, Ethyl (B): 179 mg/dL — ABNORMAL HIGH (ref <10)

## 2020-11-19 LAB — LACTIC ACID, PLASMA
Lactic Acid, Venous: 2.1 mmol/L (ref 0.5–1.9)
Lactic Acid, Venous: 3.5 mmol/L (ref 0.5–1.9)

## 2020-11-19 LAB — MAGNESIUM: Magnesium: 1.8 mg/dL (ref 1.7–2.4)

## 2020-11-19 LAB — SALICYLATE LEVEL: Salicylate Lvl: <7 mg/dL — ABNORMAL LOW (ref 7.0–30.0)

## 2020-11-19 MED ORDER — NICOTINE 21 MG/24HR TD PT24
21.0000 mg | MEDICATED_PATCH | Freq: Every day | TRANSDERMAL | Status: DC
  Administered 2020-11-19 – 2020-11-24 (×6): 21 mg via TRANSDERMAL
  Filled 2020-11-19 (×8): qty 1

## 2020-11-19 MED ORDER — IBUPROFEN 600 MG PO TABS: 600.0000 mg | ORAL_TABLET | Freq: Three times a day (TID) | ORAL | Status: DC | PRN

## 2020-11-19 MED ORDER — ACETAMINOPHEN 325 MG PO TABS
650.0000 mg | ORAL_TABLET | Freq: Four times a day (QID) | ORAL | Status: DC | PRN
  Administered 2020-11-20: 650 mg via ORAL
  Filled 2020-11-19: qty 2

## 2020-11-19 MED ORDER — PIPERACILLIN-TAZOBACTAM 3.375 G IVPB 30 MIN
3.3750 g | Freq: Once | INTRAVENOUS | Status: AC
  Administered 2020-11-19: 3.375 g via INTRAVENOUS
  Filled 2020-11-19: qty 50

## 2020-11-19 MED ORDER — FOLIC ACID 1 MG PO TABS
1.0000 mg | ORAL_TABLET | Freq: Every day | ORAL | Status: DC
  Administered 2020-11-20 – 2020-11-26 (×7): 1 mg via ORAL
  Filled 2020-11-19 (×7): qty 1

## 2020-11-19 MED ORDER — SENNOSIDES-DOCUSATE SODIUM 8.6-50 MG PO TABS: 1.0000 | ORAL_TABLET | Freq: Every evening | ORAL | Status: DC | PRN

## 2020-11-19 MED ORDER — THIAMINE HCL 100 MG PO TABS
100.0000 mg | ORAL_TABLET | Freq: Every day | ORAL | Status: DC
  Filled 2020-11-19: qty 1

## 2020-11-19 MED ORDER — ADULT MULTIVITAMIN W/MINERALS CH
1.0000 | ORAL_TABLET | Freq: Every day | ORAL | Status: DC
  Administered 2020-11-20 – 2020-11-26 (×7): 1 via ORAL
  Filled 2020-11-19 (×7): qty 1

## 2020-11-19 MED ORDER — THIAMINE HCL 100 MG PO TABS
100.0000 mg | ORAL_TABLET | Freq: Every day | ORAL | Status: DC
  Administered 2020-11-20 – 2020-11-26 (×7): 100 mg via ORAL
  Filled 2020-11-19 (×7): qty 1

## 2020-11-19 MED ORDER — LORAZEPAM 2 MG/ML IJ SOLN
1.0000 mg | INTRAMUSCULAR | Status: AC | PRN
  Administered 2020-11-20: 1 mg via INTRAVENOUS
  Administered 2020-11-22 (×2): 4 mg via INTRAVENOUS
  Administered 2020-11-22 (×2): 2 mg via INTRAVENOUS
  Filled 2020-11-19 (×2): qty 2
  Filled 2020-11-19: qty 1

## 2020-11-19 MED ORDER — ALBUTEROL SULFATE (2.5 MG/3ML) 0.083% IN NEBU: 2.5000 mg | INHALATION_SOLUTION | RESPIRATORY_TRACT | Status: DC | PRN

## 2020-11-19 MED ORDER — ONDANSETRON HCL 4 MG PO TABS: 4.0000 mg | ORAL_TABLET | Freq: Three times a day (TID) | ORAL | Status: DC | PRN

## 2020-11-19 MED ORDER — ALUM & MAG HYDROXIDE-SIMETH 200-200-20 MG/5ML PO SUSP: 30.0000 mL | Freq: Four times a day (QID) | ORAL | Status: DC | PRN

## 2020-11-19 MED ORDER — THIAMINE HCL 100 MG/ML IJ SOLN
100.0000 mg | Freq: Every day | INTRAMUSCULAR | Status: DC
  Administered 2020-11-19: 100 mg via INTRAVENOUS
  Filled 2020-11-19: qty 2

## 2020-11-19 MED ORDER — VANCOMYCIN HCL IN DEXTROSE 1-5 GM/200ML-% IV SOLN
1000.0000 mg | Freq: Two times a day (BID) | INTRAVENOUS | Status: DC
  Filled 2020-11-19: qty 200

## 2020-11-19 MED ORDER — THIAMINE HCL 100 MG/ML IJ SOLN: 100.0000 mg | Freq: Every day | INTRAMUSCULAR | Status: DC

## 2020-11-19 MED ORDER — LACTATED RINGERS IV SOLN: INTRAVENOUS | Status: DC

## 2020-11-19 MED ORDER — ONDANSETRON HCL 4 MG/2ML IJ SOLN: 4.0000 mg | Freq: Four times a day (QID) | INTRAMUSCULAR | Status: DC | PRN

## 2020-11-19 MED ORDER — ACETAMINOPHEN 650 MG RE SUPP: 650.0000 mg | Freq: Four times a day (QID) | RECTAL | Status: DC | PRN

## 2020-11-19 MED ORDER — ONDANSETRON HCL 4 MG PO TABS: 4.0000 mg | ORAL_TABLET | Freq: Four times a day (QID) | ORAL | Status: DC | PRN

## 2020-11-19 MED ORDER — LORAZEPAM 2 MG/ML IJ SOLN
0.0000 mg | Freq: Two times a day (BID) | INTRAMUSCULAR | Status: AC
  Filled 2020-11-19 (×2): qty 1

## 2020-11-19 MED ORDER — ASPIRIN EC 81 MG PO TBEC
81.0000 mg | DELAYED_RELEASE_TABLET | Freq: Every day | ORAL | Status: DC
  Administered 2020-11-20 – 2020-11-26 (×6): 81 mg via ORAL
  Filled 2020-11-19 (×6): qty 1

## 2020-11-19 MED ORDER — ZOLPIDEM TARTRATE 5 MG PO TABS
5.0000 mg | ORAL_TABLET | Freq: Every evening | ORAL | Status: DC | PRN
  Administered 2020-11-20 – 2020-11-26 (×7): 5 mg via ORAL
  Filled 2020-11-19 (×7): qty 1

## 2020-11-19 MED ORDER — POTASSIUM CHLORIDE CRYS ER 20 MEQ PO TBCR
20.0000 meq | EXTENDED_RELEASE_TABLET | Freq: Once | ORAL | Status: AC
  Administered 2020-11-20: 20 meq via ORAL
  Filled 2020-11-19: qty 1

## 2020-11-19 MED ORDER — LORAZEPAM 1 MG PO TABS
0.0000 mg | ORAL_TABLET | Freq: Four times a day (QID) | ORAL | Status: AC
  Administered 2020-11-20: 1 mg via ORAL
  Filled 2020-11-19: qty 1

## 2020-11-19 MED ORDER — VANCOMYCIN HCL 1500 MG/300ML IV SOLN
1500.0000 mg | Freq: Once | INTRAVENOUS | Status: AC
  Administered 2020-11-19: 1500 mg via INTRAVENOUS
  Filled 2020-11-19 (×2): qty 300

## 2020-11-19 MED ORDER — LORAZEPAM 2 MG/ML IJ SOLN
4.0000 mg | Freq: Once | INTRAMUSCULAR | Status: AC
  Administered 2020-11-19: 4 mg via INTRAVENOUS
  Filled 2020-11-19: qty 2

## 2020-11-19 MED ORDER — LORAZEPAM 1 MG PO TABS: 0.0000 mg | ORAL_TABLET | Freq: Two times a day (BID) | ORAL | Status: AC

## 2020-11-19 MED ORDER — PIPERACILLIN-TAZOBACTAM 3.375 G IVPB
3.3750 g | Freq: Three times a day (TID) | INTRAVENOUS | Status: DC
  Administered 2020-11-20 – 2020-11-24 (×14): 3.375 g via INTRAVENOUS
  Filled 2020-11-19 (×15): qty 50

## 2020-11-19 MED ORDER — LORAZEPAM 2 MG/ML IJ SOLN
0.0000 mg | Freq: Four times a day (QID) | INTRAMUSCULAR | Status: AC
  Administered 2020-11-20: 4 mg via INTRAVENOUS
  Administered 2020-11-21: 1 mg via INTRAVENOUS
  Filled 2020-11-19: qty 2
  Filled 2020-11-19: qty 1

## 2020-11-19 MED ORDER — ATORVASTATIN CALCIUM 40 MG PO TABS
40.0000 mg | ORAL_TABLET | Freq: Every day | ORAL | Status: DC
  Administered 2020-11-20 – 2020-11-26 (×7): 40 mg via ORAL
  Filled 2020-11-19 (×7): qty 1

## 2020-11-19 MED ORDER — LORAZEPAM 1 MG PO TABS: 1.0000 mg | ORAL_TABLET | ORAL | Status: AC | PRN

## 2020-11-19 MED ORDER — ENOXAPARIN SODIUM 40 MG/0.4ML IJ SOSY
40.0000 mg | PREFILLED_SYRINGE | INTRAMUSCULAR | Status: DC
  Administered 2020-11-20 – 2020-11-26 (×6): 40 mg via SUBCUTANEOUS
  Filled 2020-11-19 (×6): qty 0.4

## 2020-11-19 NOTE — ED Triage Notes (Addendum)
Per [facility]: Pt is IVC, paperwork on arrival, by brother "not taking care of himself", Pt has a stump that has signs of infection with maggots and fly's present. Foul smell and drainage present. Pt homeless. Pt has hx of leaving AMA from hospital. Brother wants pt transferred to [facility]. Pt is in personal wheelchair. Staples still present on stump, chart indicates possible surgery in April.

## 2020-11-19 NOTE — BH Assessment (Signed)
Disposition:  Confirmed with Prosperi, PA-C that patient is being medically admitted. Patient removed from the TTS list.   Hospitalist Medial Provider to place a consult to psych if any further psychiatric services are needed.

## 2020-11-19 NOTE — ED Provider Notes (Signed)
[facility] EMERGENCY DEPARTMENT Provider Note   CSN: [id number] Arrival date & time: 11/19/20  3570     History Chief Complaint  Patient presents with   Psychiatric Evaluation   Wound Infection    Jake Nguyen is a 55 y.o. male who presents to the department with IVC paperwork from his brother. Patient is homeless. Part of the history was obtained by the triage provider on hand off. IVC paperwork was filed because the brother is "not taking care of himself". The patient had a BKA in April and has not taken proper care of it, brother endorsing pus and maggots in wound. The patient reports he does not want to be in the hospital, and that his leg injury has not been painful to him. The patient does report the stump has been draining pus. Patient also endorses he was drinking alcohol earlier this morning, reports he had "only one beer". On further interview, patient endorses he uses alcohol to get to sleep each night. Patient was evaluated for the same problem on Monday, but left AMA after discussion for admission was made. Patient is not compliant with medications and has had no antibiotics since last visit.  HPI     Past Medical History:  Diagnosis Date   Alcoholism [facility])     Patient Active Problem List   Diagnosis Date Noted   Cellulitis of left leg 11/19/2020   Alcohol withdrawal syndrome (HCC) 11/19/2020   Hypokalemia 11/19/2020   Acute metabolic encephalopathy 11/14/2020   Sepsis (HCC) 11/14/2020   S/P BKA (below knee amputation) unilateral, left (HCC)    Neuropathic pain, leg, left    Gangrene (HCC) 07/31/2020   Gangrene of foot (HCC) 07/14/2020   Ataxia    Alcohol abuse with alcohol-induced mood disorder (HCC) 11/10/2019   Alcoholism /alcohol abuse    Alcohol withdrawal syndrome, with delirium (HCC) 11/06/2019   Pressure injury of skin 05/25/2018   Rhabdomyolysis 05/20/2018   Hyponatremia 05/20/2018   Elevated troponin 05/20/2018   Moderate  dehydration 05/20/2018   Alcohol use 05/20/2018   Influenza A 05/20/2018   Transaminitis 05/20/2018    Past Surgical History:  Procedure Laterality Date   ABDOMINAL AORTOGRAM W/LOWER EXTREMITY N/A 08/04/2020   Procedure: ABDOMINAL AORTOGRAM W/LOWER EXTREMITY;  Surgeon: Nada Libman, MD;  Location: [facility] INVASIVE CV LAB;  Service: Cardiovascular;  Laterality: N/A;   AMPUTATION Left 08/05/2020   Procedure: AMPUTATION BELOW KNEE;  Surgeon: Nada Libman, MD;  Location: [facility] OR;  Service: Vascular;  Laterality: Left;       Family History  Family history unknown: Yes    Social History   Tobacco Use   Smoking status: Every Day    Packs/day: 0.50    Types: Cigarettes   Smokeless tobacco: Never  Substance Use Topics   Alcohol use: Yes    Home Medications Prior to Admission medications   Medication Sig Start Date End Date Taking? Authorizing Provider  acetaminophen (TYLENOL) 325 MG tablet Take 2 tablets (650 mg total) by mouth every 6 (six) hours as needed for mild pain (or Fever >/= 101). 08/26/20   Russella Dar, NP  aspirin 325 MG EC tablet Take 1 tablet (325 mg total) by mouth daily. 08/27/20   Russella Dar, NP  atorvastatin (LIPITOR) 40 MG tablet Take 1 tablet (40 mg total) by mouth daily. 08/27/20   Russella Dar, NP  Baclofen 5 MG TABS Take 5 mg by mouth 3 (three) times daily. 08/26/20  Russella Dar, NP  ferrous sulfate 325 (65 FE) MG tablet Take 1 tablet (325 mg total) by mouth 2 (two) times daily with a meal. 08/26/20   Russella Dar, NP  folic acid (FOLVITE) 1 MG tablet Take 1 tablet (1 mg total) by mouth daily. 08/27/20   Russella Dar, NP  pregabalin (LYRICA) 150 MG capsule Take 1 capsule (150 mg total) by mouth 3 (three) times daily. 08/26/20   Russella Dar, NP  QUEtiapine (SEROQUEL) 25 MG tablet Take 1 tablet (25 mg total) by mouth at bedtime as needed (sleep). 08/26/20   Russella Dar, NP  senna-docusate (SENOKOT-S) 8.6-50 MG tablet Take 2 tablets  by mouth 2 (two) times daily. 08/26/20   Russella Dar, NP  thiamine 100 MG tablet Take 1 tablet (100 mg total) by mouth daily. 08/27/20   Russella Dar, NP    Allergies    Pork-derived products  Review of Systems   Review of Systems  Constitutional:  Negative for chills and fever.  Respiratory:  Negative for shortness of breath.   Cardiovascular:  Negative for chest pain.  Gastrointestinal:  Negative for abdominal pain.  Musculoskeletal:        Patient denies pain of affected BKA amputation, but reports some difficulty moving the limb at the joint.  Skin:  Positive for color change and wound.  Neurological:  Positive for tremors.  Psychiatric/Behavioral:  Positive for agitation. The patient is nervous/anxious.    Physical Exam Updated Vital Signs BP (!) 124/97   Pulse (!) 102   Temp 99.2 F (37.3 C) (Oral)   Resp 16   SpO2 100%   Physical Exam Constitutional:      Comments: Disheveled, unclean, ill-appearing  HENT:     Head: Normocephalic and atraumatic.  Eyes:     General:        Right eye: No discharge.        Left eye: No discharge.     Pupils: Pupils are equal, round, and reactive to light.  Cardiovascular:     Rate and Rhythm: Regular rhythm. Tachycardia present.  Pulmonary:     Effort: Pulmonary effort is normal.     Breath sounds: Normal breath sounds.  Musculoskeletal:     Comments: Left leg amputated below the knee. Dehisced wound edge at stump with retained surgical staples. Purulent drainage at site of dehiscence. A large area around the wound is warm to the touch, indurated, swollen. Patient denies pain with palpation but has decreased active ROM of affected joint.  Skin:    Comments: Dry and excoriated on all extremities  Neurological:     Comments: Oriented to self and place, not oriented to time, believes his amputation was last month (actually in April)  Psychiatric:     Comments: Patient shows signs of alcohol withdrawal including tremors,  labile mood, agitation. Patient became agreeable to medical advice after long discussion but was initially very agitated and attempting to leave AMA.    ED Results / Procedures / Treatments   Labs (all labs ordered are listed, but only abnormal results are displayed) Labs Reviewed  LACTIC ACID, PLASMA - Abnormal; Notable for the following components:      Result Value   Lactic Acid, Venous 2.1 (*)    All other components within normal limits  LACTIC ACID, PLASMA - Abnormal; Notable for the following components:   Lactic Acid, Venous 3.5 (*)    All other components within normal limits  COMPREHENSIVE  METABOLIC PANEL - Abnormal; Notable for the following components:   Potassium 3.4 (*)    Glucose, Bld 110 (*)    BUN <5 (*)    Creatinine, Ser 0.56 (*)    Calcium 8.2 (*)    Total Protein 8.4 (*)    Albumin 3.0 (*)    All other components within normal limits  CBC WITH DIFFERENTIAL/PLATELET - Abnormal; Notable for the following components:   RDW 16.1 (*)    All other components within normal limits  ACETAMINOPHEN LEVEL - Abnormal; Notable for the following components:   Acetaminophen (Tylenol), Serum <10 (*)    All other components within normal limits  ETHANOL - Abnormal; Notable for the following components:   Alcohol, Ethyl (B) 179 (*)    All other components within normal limits  SALICYLATE LEVEL - Abnormal; Notable for the following components:   Salicylate Lvl <7.0 (*)    All other components within normal limits  RESP PANEL BY RT-PCR (FLU A&B, COVID) ARPGX2  URINE CULTURE  CULTURE, BLOOD (ROUTINE X 2)  CULTURE, BLOOD (ROUTINE X 2)  PROTIME-INR  APTT  URINALYSIS, ROUTINE W REFLEX MICROSCOPIC  RAPID URINE DRUG SCREEN, HOSP PERFORMED  CBG MONITORING, ED    EKG None  Radiology DG Chest 1 View  Result Date: 11/19/2020 CLINICAL DATA:  Right BKA stump infection. EXAM: CHEST  1 VIEW COMPARISON:  05/20/2018 FINDINGS: The cardiac silhouette, mediastinal and hilar contours  are within normal limits. The lungs are clear. The bony thorax is intact. IMPRESSION: No acute cardiopulmonary findings. Electronically Signed   By: Rudie Meyer M.D.   On: 11/19/2020 15:44   DG Knee 1-2 Views Left  Result Date: 11/19/2020 CLINICAL DATA:  Stump infection EXAM: LEFT KNEE - 1-2 VIEW COMPARISON:  None. FINDINGS: No acute fracture or dislocation. Diffuse osteopenia. Osseous irregularity and periosteal elevation of the distal tibia and fibular osteotomy sites. No obvious bony destruction. Soft tissue swelling of the distal stump with areas of soft tissue irregularity. IMPRESSION: Soft tissue swelling of the distal stump. Osseous irregularity and periosteal reaction of the tibia and fibular osteotomy sites which could which could be related to osteotomy, although osteomyelitis cannot be excluded. Finding could be further evaluated with MRI. Electronically Signed   By: Allegra Lai MD   On: 11/19/2020 15:57    Procedures Procedures   Medications Ordered in ED Medications  LORazepam (ATIVAN) injection 0-4 mg (0 mg Intravenous Hold 11/19/20 1751)    Or  LORazepam (ATIVAN) tablet 0-4 mg ( Oral See Alternative 11/19/20 1751)  LORazepam (ATIVAN) injection 0-4 mg (has no administration in time range)    Or  LORazepam (ATIVAN) tablet 0-4 mg (has no administration in time range)  thiamine tablet 100 mg ( Oral See Alternative 11/19/20 1750)    Or  thiamine (B-1) injection 100 mg (100 mg Intravenous Given 11/19/20 1750)  zolpidem (AMBIEN) tablet 5 mg (has no administration in time range)  ibuprofen (ADVIL) tablet 600 mg (has no administration in time range)  ondansetron (ZOFRAN) tablet 4 mg (has no administration in time range)  alum & mag hydroxide-simeth (MAALOX/MYLANTA) 200-200-20 MG/5ML suspension 30 mL (has no administration in time range)  nicotine (NICODERM CQ - dosed in mg/24 hours) patch 21 mg (21 mg Transdermal Patch Applied 11/19/20 1750)  vancomycin (VANCOREADY) IVPB 1500 mg/300  mL (1,500 mg Intravenous New Bag/Given 11/19/20 2003)  LORazepam (ATIVAN) injection 4 mg (4 mg Intravenous Given 11/19/20 1750)  piperacillin-tazobactam (ZOSYN) IVPB 3.375 g (0 g Intravenous Stopped  11/19/20 1856)    ED Course  I have reviewed the triage vital signs and the nursing notes.  Pertinent labs & imaging results that were available during my care of the patient were reviewed by me and considered in my medical decision making (see chart for details).     MDM Rules/Calculators/A&P                         Positive BAC at time of arrival, but in withdrawal with tremors, tachycardia by time of evaluation. CIWA protocol initiated, initial dose of Ativan 4mg  given to patient. Patient afebrile at this time. Lactic acid increased to 3.5 from 2.1 at initial assessment. CMP, CBC unremarkable. Blood culture pending. Left leg amputated below the knee. Dehisced wound edge at stump with retained surgical staples. Purulent drainage at site of dehiscence. A large area around the wound is warm to the touch, indurated, swollen. Concerning for cellulitis. Patient denies pain with palpation but has decreased active ROM of affected joint. Soft tissue swelling appreciate, and osteomyelitis cannot be excluded based on x-ray. MRI ordered to evaluate for osteomyelitis.  Discussed at length the risks, benefits, and severity of the patient's condition. Major risks discussed include osteomyelitis of affect limb requiring AKA, bacteremia, sepsis, and death if condition goes untreated. I believe that patient warrants admission at this time to receive IV antibiotics, and orthopedic evaluation of affected limb, as well as due to alcohol withdrawal with tachycardia, tachypnea, and agitation.  Patient agrees with plan to stay in the hospital at this time provided he is given something for his withdrawal and given something to help him sleep. Final Clinical Impression(s) / ED Diagnoses Final diagnoses:  Sepsis [facility](HCC)   Infection    Rx / DC Orders ED Discharge Orders     None        West Balirosperi, Kamauri Denardo H, PA-C 11/19/20 2010    Eber HongMiller, Brian, MD 11/21/20 [phone]

## 2020-11-19 NOTE — ED Notes (Signed)
Ruben Gottron PA and charge nurse notified of pt's critical lactic of 2.1

## 2020-11-19 NOTE — Progress Notes (Signed)
Pharmacy Antibiotic Note  Jake Nguyen is a 55 y.o. male admitted on 11/19/2020 with  wound infection .  Pharmacy has been consulted for vancomycin dosing. Dosing weight 67.27 kg per review flowsheets on 4/22. Patient went to the ED at [facility] on 8/6 where his wound was assessed and he received one dose of vanc and zosyn before leaving AMA. This admission is for treatment of the same wound infection.  Zosyn 3.375g given once in ED - pharmacy not consulted for continued doses.  Plan: Vancomycin 1500 mg IV x1 Vancomycin 1000 mg IV Q 12 hrs. Goal AUC 400-550. Expected AUC: 465.4 SCr used: 0.8 Monitor renal function, clinical status, and abx plan.    Temp (24hrs), Avg:98.4 F (36.9 C), Min:98.4 F (36.9 C), Max:98.4 F (36.9 C)  Recent Labs  Lab 11/14/20 1127 11/14/20 1231 11/19/20 1033 11/19/20 1055  WBC 6.0  --  5.7  --   CREATININE  --  0.52* 0.56*  --   LATICACIDVEN  --   --   --  2.1*    CrCl cannot be calculated (Unknown ideal weight.).    Allergies  Allergen Reactions   Pork-Derived Products Other (See Comments)    Pt states he doesn't eat pork    Antimicrobials this admission: Vancomycin 8/11 >>  Zosyn 8/11 x 1  Dose adjustments this admission: None.  Microbiology results: 8/11 BCx: collected 8/11 UCx: needs to be collected    Thank you for allowing pharmacy to be a part of this patient's care.  March Rummage, PharmD PGY1 Pharmacy Resident  Please check AMION for all [facility] pharmacy phone numbers After 10:00 PM call main pharmacy [phone]

## 2020-11-19 NOTE — ED Provider Notes (Signed)
Emergency Medicine Provider Triage Evaluation Note  Jake Nguyen , a 55 y.o. male  was evaluated in triage.  Pt complains of IVC.  He was at [facility] on 8/6 for possible maggots in the wound and was found to be unresponsive.  He left AMA then.  He presents under IVC reportedly taken out by his brother due to failure to take care of himself.\\  States that he does not know why he is here, that he is angry that he is here.  Patient will not provide additional history.  Review of Systems  Positive: Wound, EMS reports flies around the wound, purulent drainage Negative: Also consciousness  Physical Exam  BP (!) 129/95 (BP Location: Left Arm)   Pulse (!) 121   Temp 98.4 F (36.9 C) (Oral)   Resp 14   SpO2 96%  Gen:   Awake, no distress mildly diaphoretic Resp:  Normal effort  MSK:   Moves extremities without difficulty left BKA.  Wound is wrapped and bandaged however has obvious foul-smelling odor. Other:    Medical Decision Making  Medically screening exam initiated at 10:36 AM.  Appropriate orders placed.  Jake Nguyen was informed that the remainder of the evaluation will be completed by another provider, this initial triage assessment does not replace that evaluation, and the importance of remaining in the ED until their evaluation is complete.  Patient is tachycardic at 121.  Given that he was recently admitted with possibility of sepsis sepsis type orders ordered, along with psychiatric orders.  Patient is not currently medically clear for psychiatric evaluation or disposition.  X-rays of the stump and chest x-rays are ordered as a suspect patient may require medical admission.  Note: Portions of this report may have been transcribed using voice recognition software. Every effort was made to ensure accuracy; however, inadvertent computerized transcription errors may be present    Norman Clay 11/19/20 1040    Pollyann Savoy, MD 11/19/20 1157

## 2020-11-19 NOTE — ED Provider Notes (Signed)
Medical screening examination/treatment/procedure(s) were conducted as a shared visit with non-physician practitioner(s) and myself.  I personally evaluated the patient during the encounter.  Clinical Impression:   Final diagnoses:  Sepsis (HCC)  Infection   A level 5 caveat applies to this patient as he is in acute alcohol withdrawal, he is acting confused agitated and is under involuntary commitment, review of systems were unable to be obtained  This patient is an ill-appearing 55 year old male who had a below the knee amputation of his left lower extremity back in April, presenting to the hospital today intoxicated when he arrived, he is now in florid withdrawal, he is diaphoretic and tremulous, he is borderline tachycardic and his left lower extremity appears red swollen with purulent foul-smelling drainage from the wound which has dehisced at the stump.  The patient has a normal white blood cell count, his lactic acid is just over 2, he is afebrile, I suspect that he does have some degree of infection in the stump, he will need antibiotics and admission to the hospital with an MRI to further evaluate for osteomyelitis.  The patient will also need aggressive treatment for alcohol withdrawal, he is actually critically ill and will need to be in a room, he will need to be admitted, he will need a high level of care in the hospital.  Patient was given 4 mg of Ativan, he improved significantly but still remains mildly tachycardic, ongoing alcohol withdrawal symptoms necessitate admission to the hospital, unfortunately his lactic acid has almost doubled, antibiotics were ordered  Discussed the care with Dr. Rachael Darby who will admit this patient to the hospital  .Critical Care  Date/Time: 11/19/2020 4:58 PM Performed by: Eber Hong, MD Authorized by: Eber Hong, MD   Critical care provider statement:    Critical care time (minutes):  45   Critical care was time spent personally by me on  the following activities:  Discussions with consultants, evaluation of patient's response to treatment, examination of patient, ordering and performing treatments and interventions, ordering and review of laboratory studies, ordering and review of radiographic studies, pulse oximetry, re-evaluation of patient's condition, obtaining history from patient or surrogate and review of old charts    Eber Hong, MD 11/21/20 1206

## 2020-11-19 NOTE — H&P (Signed)
History and Physical    Jake Nguyen RCV:[id number] DOB: [birth year] DOA: 11/19/2020  PCP: Patient, No Pcp Per (Inactive)   Patient coming from: Home  Chief Complaint: infected left leg  HPI: Jake Nguyen is a 55 y.o. male with medical history significant for PAD, HLD, ETOH abuse, s/p left BKA in April of 2022 which he never followed up for and still has retained staples. His brother filed IVC paperwork on Jake Nguyen this am because he has not been taking care of himself. Brother stated to the ER provider that he has seen pus and maggots in the wound. Jake Nguyen admits that the wound has drained white purulent discharge.  He reports has been swollen and very red over the last few days.  He states he drinks 3 to 440 ounce beers a day with the last time being earlier this morning.  On the emergency room he did have an elevated alcohol level when he first arrived.  He is now tremulous and going through withdrawals and has required Ativan for DTs.  Patient was seen earlier this week for the same problem and was admitted but he left AMA a few hours later.  He has not been compliant with his medications or antibiotics in the past.  He states he is not sure what medications he used to take but he has not taken in a while he states. Reports he smokes at least 1 pack of cigarettes per day.  He denies IV or illicit drug use.  ED Course: In the Er he has been dynamically stable with some mild tachycardia.  He has become tremulous and having alcohol withdrawals.  He was started on IV antibiotic therapy in the emergency room.  MRI of his legs been ordered to evaluate for osteomyelitis in the left BKA stump.  MRI is pending.  CBC was unremarkable.  Lactic acid was elevated at 2.1.  Sodium was 136, potassium 3.4, chloride 98, bicarb 27, creatinine 0.56, BUN less than 5, alkaline phosphatase 67, AST 37, ALT 23, albumin 3.0, calcium 8.2.  Alcohol level 179, salicylate level less than 7.  COVID swab negative.   Hospitalist service asked admit for further management  Review of Systems:  General: Reports chills. Denies fever, weight loss, night sweats.  Denies dizziness.  Denies change in appetite HENT: Denies head trauma, headache, denies change in hearing, tinnitus.  Denies nasal congestion or bleeding.  Denies sore throat, sores in mouth.  Denies difficulty swallowing Eyes: Denies blurry vision, pain in eye, drainage.  Denies discoloration of eyes. Neck: Denies pain.  Denies swelling.  Denies pain with movement. Cardiovascular: Denies chest pain, palpitations.  Denies edema.  Denies orthopnea Respiratory: Denies shortness of breath, cough.  Denies wheezing.  Denies sputum production Gastrointestinal: Denies abdominal pain, swelling.  Denies nausea, vomiting, diarrhea.  Denies melena.  Denies hematemesis. Musculoskeletal: Denies limitation of movement. Reports pain and swelling  Genitourinary: Denies pelvic pain.  Denies urinary frequency or hesitancy.  Denies dysuria.  Skin: Denies rash.  Denies petechiae, purpura, ecchymosis. Neurological: Denies syncope. Denies seizure activity. Denies paresthesia. Denies slurred speech, drooping face.  Denies visual change. Psychiatric:  Denies hallucinations.  Past Medical History:  Diagnosis Date   Alcoholism [facility])     Past Surgical History:  Procedure Laterality Date   ABDOMINAL AORTOGRAM W/LOWER EXTREMITY N/A 08/04/2020   Procedure: ABDOMINAL AORTOGRAM W/LOWER EXTREMITY;  Surgeon: Nada Libman, MD;  Location: [facility] INV[facility];  Service: Cardiovascular;  Laterality: N/A;   AMPUTATION Left  08/05/2020   Procedure: AMPUTATION BELOW KNEE;  Surgeon: Nada LibmanBrabham, Vance W, MD;  [facility];  Service: Vascular;  Laterality: Left;    Social History  reports that he has been smoking cigarettes. He has been smoking an average of .5 packs per day. He has never used smokeless tobacco. He reports current alcohol use. No history on file for drug use.  Allergies   Allergen Reactions   Pork-Derived Products Other (See Comments)    Pt states he doesn't eat pork    Family History  Family history unknown: Yes     Prior to Admission medications   Medication Sig Start Date End Date Taking? Authorizing Provider  acetaminophen (TYLENOL) 325 MG tablet Take 2 tablets (650 mg total) by mouth every 6 (six) hours as needed for mild pain (or Fever >/= 101). 08/26/20   Russella DarEllis, Allison L, NP  aspirin 325 MG EC tablet Take 1 tablet (325 mg total) by mouth daily. 08/27/20   Russella DarEllis, Allison L, NP  atorvastatin (LIPITOR) 40 MG tablet Take 1 tablet (40 mg total) by mouth daily. 08/27/20   Russella DarEllis, Allison L, NP  Baclofen 5 MG TABS Take 5 mg by mouth 3 (three) times daily. 08/26/20   Russella DarEllis, Allison L, NP  ferrous sulfate 325 (65 FE) MG tablet Take 1 tablet (325 mg total) by mouth 2 (two) times daily with a meal. 08/26/20   Russella DarEllis, Allison L, NP  folic acid (FOLVITE) 1 MG tablet Take 1 tablet (1 mg total) by mouth daily. 08/27/20   Russella DarEllis, Allison L, NP  pregabalin (LYRICA) 150 MG capsule Take 1 capsule (150 mg total) by mouth 3 (three) times daily. 08/26/20   Russella DarEllis, Allison L, NP  QUEtiapine (SEROQUEL) 25 MG tablet Take 1 tablet (25 mg total) by mouth at bedtime as needed (sleep). 08/26/20   Russella DarEllis, Allison L, NP  senna-docusate (SENOKOT-S) 8.6-50 MG tablet Take 2 tablets by mouth 2 (two) times daily. 08/26/20   Russella DarEllis, Allison L, NP  thiamine 100 MG tablet Take 1 tablet (100 mg total) by mouth daily. 08/27/20   Russella DarEllis, Allison L, NP    Physical Exam: Vitals:   11/19/20 1715 11/19/20 1827 11/19/20 1900 11/19/20 1915  BP: (!) 130/101 126/76 113/72 (!) 126/92  Pulse: 98 86 78 (!) 104  Resp: 16 18 16  (!) 30  Temp: 98.1 F (36.7 C)     TempSrc: Oral     SpO2: 96% 96% 95% 94%    Constitutional: NAD, calm, comfortable Vitals:   11/19/20 1715 11/19/20 1827 11/19/20 1900 11/19/20 1915  BP: (!) 130/101 126/76 113/72 (!) 126/92  Pulse: 98 86 78 (!) 104  Resp: 16 18 16  (!) 30  Temp:  98.1 F (36.7 C)     TempSrc: Oral     SpO2: 96% 96% 95% 94%   General: WDWN, Alert and oriented to person and place.  Eyes: EOMI, PERRL, conjunctivae normal.  Sclera nonicteric HENT:  /AT, external ears normal.  Nares patent without epistasis.  Mucous membranes are moist.  Neck: Soft, normal range of motion, supple, no masses, Trachea midline Respiratory: Equal breath sounds with mild diffuse rales. no wheezing, no crackles. Normal respiratory effort. No accessory muscle use.  Cardiovascular: Regular rate and rhythm, no murmurs / rubs / gallops. No extremity edema. 2+ pedal pulses.  Abdomen: Soft, left stump tenderness to palpation, nondistended, no rebound or guarding.  No masses palpated. Bowel sounds normoactive Musculoskeletal: FROM of RLE and BUE, left BKA with stump having erythema  and warm to touch.Limited movement of left knee joint. Surgical staples in place with mild dehiscence with purulent white drainage. Indurated around edge of wound with no fluctuance no cyanosis. Normal muscle tone.  Skin: Warm, dry, intact no rashes, lesions, ulcers. No induration Neurologic: CN 2-12 grossly intact.  Normal speech.  Sensation intact. Strength 5/5 in all extremities.   Psychiatric:  Tremors, agitated. Anxious mood.    Labs on Admission: I have personally reviewed following labs and imaging studies  CBC: Recent Labs  Lab 11/14/20 1127 11/19/20 1033  WBC 6.0 5.7  NEUTROABS 3.4 3.3  HGB 13.5 13.1  HCT 41.4 40.9  MCV 84.7 84.9  PLT 153 188    Basic Metabolic Panel: Recent Labs  Lab 11/14/20 1231 11/19/20 1033  NA 136 136  K 3.5 3.4*  CL 95* 98  CO2 28 27  GLUCOSE 105* 110*  BUN <5* <5*  CREATININE 0.52* 0.56*  CALCIUM 8.0* 8.2*    GFR: CrCl cannot be calculated (Unknown ideal weight.).  Liver Function Tests: Recent Labs  Lab 11/14/20 1231 11/19/20 1033  AST 35 37  ALT 21 23  ALKPHOS 66 67  BILITOT 0.3 0.3  PROT 7.8 8.4*  ALBUMIN 3.0* 3.0*    Urine  analysis:    Component Value Date/Time   COLORURINE YELLOW 05/20/2018 1458   APPEARANCEUR CLEAR 05/20/2018 1458   LABSPEC 1.024 05/20/2018 1458   PHURINE 6.0 05/20/2018 1458   GLUCOSEU NEGATIVE 05/20/2018 1458   HGBUR LARGE (A) 05/20/2018 1458   BILIRUBINUR NEGATIVE 05/20/2018 1458   KETONESUR 80 (A) 05/20/2018 1458   PROTEINUR >=300 (A) 05/20/2018 1458   UROBILINOGEN 0.2 08/02/2009 2144   NITRITE NEGATIVE 05/20/2018 1458   LEUKOCYTESUR NEGATIVE 05/20/2018 1458    Radiological Exams on Admission: DG Chest 1 View  Result Date: 11/19/2020 CLINICAL DATA:  Right BKA stump infection. EXAM: CHEST  1 VIEW COMPARISON:  05/20/2018 FINDINGS: The cardiac silhouette, mediastinal and hilar contours are within normal limits. The lungs are clear. The bony thorax is intact. IMPRESSION: No acute cardiopulmonary findings. Electronically Signed   By: Rudie Meyer M.D.   On: 11/19/2020 15:44   DG Knee 1-2 Views Left  Result Date: 11/19/2020 CLINICAL DATA:  Stump infection EXAM: LEFT KNEE - 1-2 VIEW COMPARISON:  None. FINDINGS: No acute fracture or dislocation. Diffuse osteopenia. Osseous irregularity and periosteal elevation of the distal tibia and fibular osteotomy sites. No obvious bony destruction. Soft tissue swelling of the distal stump with areas of soft tissue irregularity. IMPRESSION: Soft tissue swelling of the distal stump. Osseous irregularity and periosteal reaction of the tibia and fibular osteotomy sites which could which could be related to osteotomy, although osteomyelitis cannot be excluded. Finding could be further evaluated with MRI. Electronically Signed   By: Allegra Lai MD   On: 11/19/2020 15:57    EKG: Independently reviewed. EKG has been ordered and is pending at this time  Assessment/Plan Principal Problem:   Cellulitis of left leg Mr. Friedmann is admitted to Medical telemetry floor. He was IVC'd by his brother prior to being sent to ER.  Pt is started on Vancomycin and  Zosyn in the ER for cellulitis of left leg stump of BKA. MRI has been ordered to evaluate for osteomyelitis.  IVF with LR at 75 ml/hr  Check CBC, electrolytes and renal function in am  Active Problems:   Alcohol withdrawal syndrome  Placed on CIWA protocol and ativan provided for elevated CIWA score.  Placed on fall and seizure  precautions.  Reports he drinks 3-4 40 oz beers a day and last drank early this am.     Hypokalemia Potassium was 3.4 in the ER. Check Magnesium level and will replete if low.  Given K-dur 20 meq     S/P BKA (below knee amputation) unilateral, left      DVT prophylaxis: Lovenox for DVT prophylaxis.   Code Status:   Full Code  Family Communication:  Diagnosis and plan discussed with patient.  Further recommendations to follow as clinically indicated.  No family at bedside Disposition Plan:   Patient is from:  Home  Anticipated DC to:  Home  Anticipated DC date:  Anticipate 2 midnight or more stay  Anticipated DC barriers: No barriers to discharge identified at this time  Admission status:  Inpatient   Claudean Severance Ottie Tillery MD [facility]  How to contact the [facility] Attending or Consulting provider 7A - 7P or covering provider during after hours 7P -7A, for this patient?   Check the care team in [facility] and look for a) attending/consulting [facility] provider listed and b) the [facility] team listed Log into [email or web address] and use [facility] universal password to access. If you do not have the password, please contact the hospital operator. Locate the [facility] provider you are looking for under [facility] and page to a number that you can be directly reached. If you still have difficulty reaching the provider, please page the [facility] (Director on Call) for the Hospitalists listed on amion for assistance.  11/19/2020, 7:50 PM

## 2020-11-20 ENCOUNTER — Inpatient Hospital Stay (HOSPITAL_COMMUNITY): Payer: Medicaid Other

## 2020-11-20 LAB — URINALYSIS, ROUTINE W REFLEX MICROSCOPIC
Bilirubin Urine: NEGATIVE
Glucose, UA: NEGATIVE mg/dL
Hgb urine dipstick: NEGATIVE
Ketones, ur: NEGATIVE mg/dL
Leukocytes,Ua: NEGATIVE
Nitrite: NEGATIVE
Protein, ur: NEGATIVE mg/dL
Specific Gravity, Urine: 1.011 (ref 1.005–1.030)
pH: 8 (ref 5.0–8.0)

## 2020-11-20 LAB — RAPID URINE DRUG SCREEN, HOSP PERFORMED
Amphetamines: NOT DETECTED
Barbiturates: NOT DETECTED
Benzodiazepines: POSITIVE — AB
Cocaine: NOT DETECTED
Opiates: NOT DETECTED
Tetrahydrocannabinol: NOT DETECTED

## 2020-11-20 LAB — CBC
HCT: 35.8 % — ABNORMAL LOW (ref 39.0–52.0)
Hemoglobin: 11.8 g/dL — ABNORMAL LOW (ref 13.0–17.0)
MCH: 27.8 pg (ref 26.0–34.0)
MCHC: 33 g/dL (ref 30.0–36.0)
MCV: 84.2 fL (ref 80.0–100.0)
Platelets: 155 10*3/uL (ref 150–400)
RBC: 4.25 MIL/uL (ref 4.22–5.81)
RDW: 15.9 % — ABNORMAL HIGH (ref 11.5–15.5)
WBC: 7.9 10*3/uL (ref 4.0–10.5)
nRBC: 0 % (ref 0.0–0.2)

## 2020-11-20 LAB — BASIC METABOLIC PANEL
Anion gap: 13 (ref 5–15)
BUN: <5 mg/dL — ABNORMAL LOW (ref 6–20)
CO2: 27 mmol/L (ref 22–32)
Calcium: 8.6 mg/dL — ABNORMAL LOW (ref 8.9–10.3)
Chloride: 93 mmol/L — ABNORMAL LOW (ref 98–111)
Creatinine, Ser: 0.6 mg/dL — ABNORMAL LOW (ref 0.61–1.24)
GFR, Estimated: >60 mL/min (ref >60)
Glucose, Bld: 92 mg/dL (ref 70–99)
Potassium: 3.5 mmol/L (ref 3.5–5.1)
Sodium: 133 mmol/L — ABNORMAL LOW (ref 135–145)

## 2020-11-20 LAB — HIV ANTIBODY (ROUTINE TESTING W REFLEX): HIV Screen 4th Generation wRfx: NONREACTIVE

## 2020-11-20 MED ORDER — GERHARDT'S BUTT CREAM
TOPICAL_CREAM | Freq: Three times a day (TID) | CUTANEOUS | Status: DC
  Filled 2020-11-20 (×2): qty 1

## 2020-11-20 MED ORDER — GADOBUTROL 1 MMOL/ML IV SOLN
7.0000 mL | Freq: Once | INTRAVENOUS | Status: AC | PRN
  Administered 2020-11-20: 7 mL via INTRAVENOUS

## 2020-11-20 MED ORDER — VANCOMYCIN HCL 1250 MG/250ML IV SOLN
1250.0000 mg | Freq: Two times a day (BID) | INTRAVENOUS | Status: DC
  Administered 2020-11-20 – 2020-11-23 (×7): 1250 mg via INTRAVENOUS
  Filled 2020-11-20 (×8): qty 250

## 2020-11-20 MED ORDER — DIPHENHYDRAMINE-ZINC ACETATE 2-0.1 % EX CREA
TOPICAL_CREAM | Freq: Two times a day (BID) | CUTANEOUS | Status: DC | PRN
  Filled 2020-11-20: qty 28

## 2020-11-20 NOTE — ED Notes (Signed)
Pt returned from MRI °

## 2020-11-20 NOTE — H&P (View-Only) (Signed)
Hospital Consult    Reason for Consult:  infected L BKA Requesting Physician:  Dr. Natale Milch MRN #:  [id number]  History of Present Illness: This is a 55 y.o. male with medical history significant for hyperlipidemia, ETOH abuse, and PAD s/p left BKA in April of 2022 by Dr. Myra Gianotti. He presents to ER per brother who filed IVC paperwork because he has not been caring for himself and has infected left BKA with pus and maggots. He was just seen on 11/14/20 in the ED by Dr. Lenell Antu in consultation as well for same issues and an above knee amputation was recommended but patient was not interested in surgery. It was explained to him that this could lead to sepsis and be life threatening however left AMA. Vascular surgery again has been consulted to evaluate his infected BKA stump.  Past Medical History:  Diagnosis Date   Alcoholism [facility])     Past Surgical History:  Procedure Laterality Date   ABDOMINAL AORTOGRAM W/LOWER EXTREMITY N/A 08/04/2020   Procedure: ABDOMINAL AORTOGRAM W/LOWER EXTREMITY;  Surgeon: Nada Libman, MD;  Location: [facility] INVASIVE CV LAB;  Service: Cardiovascular;  Laterality: N/A;   AMPUTATION Left 08/05/2020   Procedure: AMPUTATION BELOW KNEE;  Surgeon: Nada Libman, MD;  Location: [facility] OR;  Service: Vascular;  Laterality: Left;    Allergies  Allergen Reactions   Pork-Derived Products Other (See Comments)    Pt states he doesn't eat pork    Prior to Admission medications   Medication Sig Start Date End Date Taking? Authorizing Provider  acetaminophen (TYLENOL) 325 MG tablet Take 2 tablets (650 mg total) by mouth every 6 (six) hours as needed for mild pain (or Fever >/= 101). Patient not taking: Reported on 11/19/2020 08/26/20   Russella Dar, NP  aspirin 325 MG EC tablet Take 1 tablet (325 mg total) by mouth daily. Patient not taking: Reported on 11/19/2020 08/27/20   Russella Dar, NP  atorvastatin (LIPITOR) 40 MG tablet Take 1 tablet (40 mg total) by mouth  daily. Patient not taking: Reported on 11/19/2020 08/27/20   Russella Dar, NP  Baclofen 5 MG TABS Take 5 mg by mouth 3 (three) times daily. Patient not taking: Reported on 11/19/2020 08/26/20   Russella Dar, NP  ferrous sulfate 325 (65 FE) MG tablet Take 1 tablet (325 mg total) by mouth 2 (two) times daily with a meal. Patient not taking: Reported on 11/19/2020 08/26/20   Russella Dar, NP  folic acid (FOLVITE) 1 MG tablet Take 1 tablet (1 mg total) by mouth daily. Patient not taking: Reported on 11/19/2020 08/27/20   Russella Dar, NP  pregabalin (LYRICA) 150 MG capsule Take 1 capsule (150 mg total) by mouth 3 (three) times daily. Patient not taking: Reported on 11/19/2020 08/26/20   Russella Dar, NP  QUEtiapine (SEROQUEL) 25 MG tablet Take 1 tablet (25 mg total) by mouth at bedtime as needed (sleep). Patient not taking: Reported on 11/19/2020 08/26/20   Russella Dar, NP  senna-docusate (SENOKOT-S) 8.6-50 MG tablet Take 2 tablets by mouth 2 (two) times daily. Patient not taking: No sig reported 08/26/20   Russella Dar, NP  thiamine 100 MG tablet Take 1 tablet (100 mg total) by mouth daily. Patient not taking: Reported on 11/19/2020 08/27/20   Russella Dar, NP    Social History   Socioeconomic History   Marital status: Single    Spouse name: Not on file   Number of children: Not  on file   Years of education: Not on file   Highest education level: Not on file  Occupational History   Not on file  Tobacco Use   Smoking status: Every Day    Packs/day: 0.50    Types: Cigarettes   Smokeless tobacco: Never  Substance and Sexual Activity   Alcohol use: Yes   Drug use: Not on file   Sexual activity: Not Currently  Other Topics Concern   Not on file  Social History Narrative   Not on file   Social Determinants of Health   Financial Resource Strain: Not on file  Food Insecurity: Not on file  Transportation Needs: Not on file  Physical Activity: Not on file  Stress:  Not on file  Social Connections: Not on file  Intimate Partner Violence: Not on file    Family History  Family history unknown: Yes    ROS: Otherwise negative unless mentioned in HPI  Physical Examination  Vitals:   11/20/20 0900 11/20/20 0945  BP: 129/86 122/85  Pulse: (!) 108 (!) 108  Resp: 20 20  Temp:    SpO2: 95% 96%   Body mass index is 23.68 kg/m.  General:  chronically ill appearing, NAD Gait: Not observed HENT: WNL, normocephalic Pulmonary: normal non-labored breathing Cardiac: regular rate and rhythm Abdomen:  soft, NT/ND, no masses Extremities: left BKA stump grossly infected. Staples still present from April. Large gaping wound with fibrinous exudate of the posterior flap.  Purulent drainage present. No maggots present since wound cleaned. Kerlix wrap applied Musculoskeletal: no muscle wasting or atrophy  Neurologic: A&O X 3;  No focal weakness or paresthesias are detected; speech is fluent/normal Psychiatric:  The pt has normal affect.   CBC    Component Value Date/Time   WBC 7.9 11/20/2020 0500   RBC 4.25 11/20/2020 0500   HGB 11.8 (L) 11/20/2020 0500   HCT 35.8 (L) 11/20/2020 0500   HCT 36.4 (L) 05/20/2018 1923   PLT 155 11/20/2020 0500   MCV 84.2 11/20/2020 0500   MCH 27.8 11/20/2020 0500   MCHC 33.0 11/20/2020 0500   RDW 15.9 (H) 11/20/2020 0500   LYMPHSABS 1.6 11/19/2020 1033   MONOABS 0.6 11/19/2020 1033   EOSABS 0.2 11/19/2020 1033   BASOSABS 0.1 11/19/2020 1033    BMET    Component Value Date/Time   NA 133 (L) 11/20/2020 0500   K 3.5 11/20/2020 0500   CL 93 (L) 11/20/2020 0500   CO2 27 11/20/2020 0500   GLUCOSE 92 11/20/2020 0500   BUN <5 (L) 11/20/2020 0500   CREATININE 0.60 (L) 11/20/2020 0500   CALCIUM 8.6 (L) 11/20/2020 0500   GFRNONAA >60 11/20/2020 0500   GFRAA >60 11/12/2019 0359    COAGS: Lab Results  Component Value Date   INR 1.0 11/19/2020   INR 1.1 08/05/2020   INR 1.1 11/07/2019   Non invasive  studies: MR Knee Left w wo contrast: IMPRESSION: 1. Greater than expected localized edema and enhancement along the distal margin of the tibial amputation site for 4 months post amputation, suspicious for low-grade osteomyelitis along the distal bony margin. 2. Subcutaneous and intramuscular edema in the calf with subcutaneous enhancement characteristic of cellulitis and likely myositis. No drainable abscess is identified. 3. Incidental small bone infarct in the proximal tibia, chronic.   ASSESSMENT/PLAN: This is a 55 y.o. male with sepsis secondary to grossly infected left below knee amputation. MRI showing low grade osteomyelitis. Recommendation is for left above knee amputation. He is  now agreeable to proceed. He will be admitted by Hospitalist's for IV antibiotics and further medical management. Will keep NPO after midnight for left AKA tomorrow 11/21/20. Patient was seen with Dr. Myra Gianotti who will provide further surgical plans   Graceann Congress PA-C [facility] [phone] 11/20/2020  11:03 AM  I agree with the above.  I have seen and evaluated the patient.  He has a nonhealing left below-knee amputation which needs to be converted to an above-knee amputation.  The patient is agreeable to this.  I will plan to do his surgery tomorrow.  He will be n.p.o. after midnight.  Durene Cal

## 2020-11-20 NOTE — ED Notes (Signed)
Attempted to call report, receiving charge has not approved pt assignment at this time. Will call back.

## 2020-11-20 NOTE — Consult Note (Addendum)
Hospital Consult    Reason for Consult:  infected L BKA Requesting Physician:  Dr. Natale Milch MRN #:  [id number]  History of Present Illness: This is a 55 y.o. male with medical history significant for hyperlipidemia, ETOH abuse, and PAD s/p left BKA in April of 2022 by Dr. Myra Gianotti. He presents to ER per brother who filed IVC paperwork because he has not been caring for himself and has infected left BKA with pus and maggots. He was just seen on 11/14/20 in the ED by Dr. Lenell Antu in consultation as well for same issues and an above knee amputation was recommended but patient was not interested in surgery. It was explained to him that this could lead to sepsis and be life threatening however left AMA. Vascular surgery again has been consulted to evaluate his infected BKA stump.  Past Medical History:  Diagnosis Date   Alcoholism [facility])     Past Surgical History:  Procedure Laterality Date   ABDOMINAL AORTOGRAM W/LOWER EXTREMITY N/A 08/04/2020   Procedure: ABDOMINAL AORTOGRAM W/LOWER EXTREMITY;  Surgeon: Nada Libman, MD;  Location: [facility] INVAS[facility];  Service: Cardiovascular;  Laterality: N/A;   AMPUTATION Left 08/05/2020   Procedure: AMPUTATION BELOW KNEE;  Surgeon: Nada Libman, MD;  Location: [facility] OR;  Service: Vascular;  Laterality: Left;    Allergies  Allergen Reactions   Pork-Derived Products Other (See Comments)    Pt states he doesn't eat pork    Prior to Admission medications   Medication Sig Start Date End Date Taking? Authorizing Provider  acetaminophen (TYLENOL) 325 MG tablet Take 2 tablets (650 mg total) by mouth every 6 (six) hours as needed for mild pain (or Fever >/= 101). Patient not taking: Reported on 11/19/2020 08/26/20   Russella Dar, NP  aspirin 325 MG EC tablet Take 1 tablet (325 mg total) by mouth daily. Patient not taking: Reported on 11/19/2020 08/27/20   Russella Dar, NP  atorvastatin (LIPITOR) 40 MG tablet Take 1 tablet (40 mg total) by mouth  daily. Patient not taking: Reported on 11/19/2020 08/27/20   Russella Dar, NP  Baclofen 5 MG TABS Take 5 mg by mouth 3 (three) times daily. Patient not taking: Reported on 11/19/2020 08/26/20   Russella Dar, NP  ferrous sulfate 325 (65 FE) MG tablet Take 1 tablet (325 mg total) by mouth 2 (two) times daily with a meal. Patient not taking: Reported on 11/19/2020 08/26/20   Russella Dar, NP  folic acid (FOLVITE) 1 MG tablet Take 1 tablet (1 mg total) by mouth daily. Patient not taking: Reported on 11/19/2020 08/27/20   Russella Dar, NP  pregabalin (LYRICA) 150 MG capsule Take 1 capsule (150 mg total) by mouth 3 (three) times daily. Patient not taking: Reported on 11/19/2020 08/26/20   Russella Dar, NP  QUEtiapine (SEROQUEL) 25 MG tablet Take 1 tablet (25 mg total) by mouth at bedtime as needed (sleep). Patient not taking: Reported on 11/19/2020 08/26/20   Russella Dar, NP  senna-docusate (SENOKOT-S) 8.6-50 MG tablet Take 2 tablets by mouth 2 (two) times daily. Patient not taking: No sig reported 08/26/20   Russella Dar, NP  thiamine 100 MG tablet Take 1 tablet (100 mg total) by mouth daily. Patient not taking: Reported on 11/19/2020 08/27/20   Russella Dar, NP    Social History   Socioeconomic History   Marital status: Single    Spouse name: Not on file   Number of children: Not  on file   Years of education: Not on file   Highest education level: Not on file  Occupational History   Not on file  Tobacco Use   Smoking status: Every Day    Packs/day: 0.50    Types: Cigarettes   Smokeless tobacco: Never  Substance and Sexual Activity   Alcohol use: Yes   Drug use: Not on file   Sexual activity: Not Currently  Other Topics Concern   Not on file  Social History Narrative   Not on file   Social Determinants of Health   Financial Resource Strain: Not on file  Food Insecurity: Not on file  Transportation Needs: Not on file  Physical Activity: Not on file  Stress:  Not on file  Social Connections: Not on file  Intimate Partner Violence: Not on file    Family History  Family history unknown: Yes    ROS: Otherwise negative unless mentioned in HPI  Physical Examination  Vitals:   11/20/20 0900 11/20/20 0945  BP: 129/86 122/85  Pulse: (!) 108 (!) 108  Resp: 20 20  Temp:    SpO2: 95% 96%   Body mass index is 23.68 kg/m.  General:  chronically ill appearing, NAD Gait: Not observed HENT: WNL, normocephalic Pulmonary: normal non-labored breathing Cardiac: regular rate and rhythm Abdomen:  soft, NT/ND, no masses Extremities: left BKA stump grossly infected. Staples still present from April. Large gaping wound with fibrinous exudate of the posterior flap.  Purulent drainage present. No maggots present since wound cleaned. Kerlix wrap applied Musculoskeletal: no muscle wasting or atrophy  Neurologic: A&O X 3;  No focal weakness or paresthesias are detected; speech is fluent/normal Psychiatric:  The pt has normal affect.   CBC    Component Value Date/Time   WBC 7.9 11/20/2020 0500   RBC 4.25 11/20/2020 0500   HGB 11.8 (L) 11/20/2020 0500   HCT 35.8 (L) 11/20/2020 0500   HCT 36.4 (L) 05/20/2018 1923   PLT 155 11/20/2020 0500   MCV 84.2 11/20/2020 0500   MCH 27.8 11/20/2020 0500   MCHC 33.0 11/20/2020 0500   RDW 15.9 (H) 11/20/2020 0500   LYMPHSABS 1.6 11/19/2020 1033   MONOABS 0.6 11/19/2020 1033   EOSABS 0.2 11/19/2020 1033   BASOSABS 0.1 11/19/2020 1033    BMET    Component Value Date/Time   NA 133 (L) 11/20/2020 0500   K 3.5 11/20/2020 0500   CL 93 (L) 11/20/2020 0500   CO2 27 11/20/2020 0500   GLUCOSE 92 11/20/2020 0500   BUN <5 (L) 11/20/2020 0500   CREATININE 0.60 (L) 11/20/2020 0500   CALCIUM 8.6 (L) 11/20/2020 0500   GFRNONAA >60 11/20/2020 0500   GFRAA >60 11/12/2019 0359    COAGS: Lab Results  Component Value Date   INR 1.0 11/19/2020   INR 1.1 08/05/2020   INR 1.1 11/07/2019   Non invasive  studies: MR Knee Left w wo contrast: IMPRESSION: 1. Greater than expected localized edema and enhancement along the distal margin of the tibial amputation site for 4 months post amputation, suspicious for low-grade osteomyelitis along the distal bony margin. 2. Subcutaneous and intramuscular edema in the calf with subcutaneous enhancement characteristic of cellulitis and likely myositis. No drainable abscess is identified. 3. Incidental small bone infarct in the proximal tibia, chronic.   ASSESSMENT/PLAN: This is a 55 y.o. male with sepsis secondary to grossly infected left below knee amputation. MRI showing low grade osteomyelitis. Recommendation is for left above knee amputation. He is  now agreeable to proceed. He will be admitted by Hospitalist's for IV antibiotics and further medical management. Will keep NPO after midnight for left AKA tomorrow 11/21/20. Patient was seen with Dr. Myra Gianotti who will provide further surgical plans   Graceann Congress PA-C [facility] [phone] 11/20/2020  11:03 AM  I agree with the above.  I have seen and evaluated the patient.  He has a nonhealing left below-knee amputation which needs to be converted to an above-knee amputation.  The patient is agreeable to this.  I will plan to do his surgery tomorrow.  He will be n.p.o. after midnight.  Durene Cal

## 2020-11-20 NOTE — Progress Notes (Signed)
PROGRESS NOTE    Jake Nguyen  VWU:[id number] DOB: 1965-05-15 DOA: 11/19/2020 PCP: Patient, No Pcp Per (Inactive)   Brief Narrative:  Jake Nguyen is a 55 y.o. male well known to the hospital system with medical history significant for PAD, HLD, ETOH abuse, s/p left BKA in April of 2022 which he never followed up for and still has retained staples. His brother filed IVC paperwork on Jake Nguyen this am because he has not been taking care of himself. Brother stated to the ER provider that he has seen pus and maggots in the wound. Jake Nguyen admits that the wound has drained white purulent discharge.  He reports has been swollen and very red over the last few days. Patient was seen earlier this week for the same problem and was admitted but he left AMA a few hours later.  He has not been compliant with his medications or antibiotics in the past.   Assessment & Plan:   Principal Problem:   Cellulitis of left leg Active Problems:   S/P BKA (below knee amputation) unilateral, left (HCC)   Alcohol withdrawal syndrome (HCC)   Hypokalemia   SEPSIS secondary to osteomyelitis - secondary to noncompliance - Based on imagine - Vascular consulted - he is well known to their practice - likely will need debridement/more proximal amputation - Patient has been IVC'd by his brother prior to being sent to ER - on 11/18/20 - Continue Zosyn/vancomycin  Alcohol abuse with active withdrawal  - Continue CIWA protocol - Tolerating well - Reports 120-160 ounces of beer/24h - last drink within 12 hours of admission   Hypokalemia Follow repeat labs - replete as appropriate  Chronic comorbid diseases Peripheral arterial disease - continue ASA 81, Statin HLD - continue statin  DVT prophylaxis: Lovenox Code Status: Full Family Communication: Offered to call brother - patient requested to update himself  Status is: Inpt  Dispo: The patient is from: Home              Anticipated d/c is to: TBD               Anticipated d/c date is: >72h              Patient currently NOT medically stable for discharge  Consultants:  Vascular Sx  Procedures:  Pending  Antimicrobials:  Vanc/Zosyn 8/11 - ongoing   Subjective: No acute issues/events overnight - currently somnolent but arousable, ROS limited  Objective: Vitals:   11/20/20 0616 11/20/20 0630 11/20/20 0700 11/20/20 0729  BP:  (!) 129/100 126/86 118/89  Pulse:  (!) 108 (!) 120 (!) 111  Resp:  (!) 28 (!) 21 16  Temp: 98.8 F (37.1 C)     TempSrc: Oral     SpO2:  97% 99% 100%  Weight:      Height:        Intake/Output Summary (Last 24 hours) at 11/20/2020 0823 Last data filed at 11/20/2020 0347 Gross per 24 hour  Intake --  Output 950 ml  Net -950 ml   Filed Weights   11/20/20 0218  Weight: 74.8 kg    Examination:  General exam: Appears calm and comfortable  Respiratory system: Clear to auscultation. Respiratory effort normal. Cardiovascular system: S1 & S2 heard, RRR. No JVD, murmurs, rubs, gallops or clicks. No pedal edema. Gastrointestinal system: Abdomen is nondistended, soft and nontender. No organomegaly or masses felt. Normal bowel sounds heard. Central nervous system: Asleep but easily arousable Extremities: Symmetric 5 x 5  power. Skin: See image below: (notable for maggots, and residual staples from surgery in April 2022)    Data Reviewed: I have personally reviewed following labs and imaging studies  CBC: Recent Labs  Lab 11/14/20 1127 11/19/20 1033 11/20/20 0500  WBC 6.0 5.7 7.9  NEUTROABS 3.4 3.3  --   HGB 13.5 13.1 11.8*  HCT 41.4 40.9 35.8*  MCV 84.7 84.9 84.2  PLT 153 188 155   Basic Metabolic Panel: Recent Labs  Lab 11/14/20 1231 11/19/20 1033 11/19/20 2241 11/20/20 0500  NA 136 136  --  133*  K 3.5 3.4*  --  3.5  CL 95* 98  --  93*  CO2 28 27  --  27  GLUCOSE 105* 110*  --  92  BUN <5* <5*  --  <5*  CREATININE 0.52* 0.56*  --  0.60*  CALCIUM 8.0* 8.2*  --  8.6*  MG  --    --  1.8  --   PHOS  --   --  3.3  --    GFR: Estimated Creatinine Clearance: 110.3 mL/min (A) (by C-G formula based on SCr of 0.6 mg/dL (L)).  Liver Function Tests: Recent Labs  Lab 11/14/20 1231 11/19/20 1033  AST 35 37  ALT 21 23  ALKPHOS 66 67  BILITOT 0.3 0.3  PROT 7.8 8.4*  ALBUMIN 3.0* 3.0*   No results for input(s): LIPASE, AMYLASE in the last 168 hours. No results for input(s): AMMONIA in the last 168 hours.  Coagulation Profile: Recent Labs  Lab 11/19/20 1033  INR 1.0   Cardiac Enzymes: No results for input(s): CKTOTAL, CKMB, CKMBINDEX, TROPONINI in the last 168 hours. BNP (last 3 results) No results for input(s): PROBNP in the last 8760 hours. HbA1C: No results for input(s): HGBA1C in the last 72 hours. CBG: No results for input(s): GLUCAP in the last 168 hours. Lipid Profile: No results for input(s): CHOL, HDL, LDLCALC, TRIG, CHOLHDL, LDLDIRECT in the last 72 hours. Thyroid Function Tests: No results for input(s): TSH, T4TOTAL, FREET4, T3FREE, THYROIDAB in the last 72 hours. Anemia Panel: No results for input(s): VITAMINB12, FOLATE, FERRITIN, TIBC, IRON, RETICCTPCT in the last 72 hours. Sepsis Labs: Recent Labs  Lab 11/19/20 1055 11/19/20 1754  LATICACIDVEN 2.1* 3.5*   Recent Results (from the past 240 hour(s))  Resp Panel by RT-PCR (Flu A&B, Covid) Nasopharyngeal Swab     Status: None   Collection Time: 11/14/20 10:40 AM   Specimen: Nasopharyngeal Swab; Nasopharyngeal(NP) swabs in vial transport medium  Result Value Ref Range Status   SARS Coronavirus 2 by RT PCR NEGATIVE NEGATIVE Final    Comment: (NOTE) SARS-CoV-2 target nucleic acids are NOT DETECTED.  The SARS-CoV-2 RNA is generally detectable in upper respiratory specimens during the acute phase of infection. The lowest concentration of SARS-CoV-2 viral copies this assay can detect is 138 copies/mL. A negative result does not preclude SARS-Cov-2 infection and should not be used as the sole  basis for treatment or other patient management decisions. A negative result may occur with  improper specimen collection/handling, submission of specimen other than nasopharyngeal swab, presence of viral mutation(s) within the areas targeted by this assay, and inadequate number of viral copies(<138 copies/mL). A negative result must be combined with clinical observations, patient history, and epidemiological information. The expected result is Negative.  Fact Sheet for Patients:  BloggerCourse.com  Fact Sheet for Healthcare Providers:  SeriousBroker.it  This test is no t yet approved or cleared by the [place] FDA  and  has been authorized for detection and/or diagnosis of SARS-CoV-2 by FDA under an Emergency Use Authorization (EUA). This EUA will remain  in effect (meaning this test can be used) for the duration of the COVID-19 declaration under Section 564(b)(1) of the Act, 21 U.S.C.section 360bbb-3(b)(1), unless the authorization is terminated  or revoked sooner.       Influenza A by PCR NEGATIVE NEGATIVE Final   Influenza B by PCR NEGATIVE NEGATIVE Final    Comment: (NOTE) The Xpert Xpress SARS-CoV-2/FLU/RSV plus assay is intended as an aid in the diagnosis of influenza from Nasopharyngeal swab specimens and should not be used as a sole basis for treatment. Nasal washings and aspirates are unacceptable for Xpert Xpress SARS-CoV-2/FLU/RSV testing.  Fact Sheet for Patients: BloggerCourse.com  Fact Sheet for Healthcare Providers: SeriousBroker.it  This test is not yet approved or cleared by the [place] FDA and has been authorized for detection and/or diagnosis of SARS-CoV-2 by FDA under an Emergency Use Authorization (EUA). This EUA will remain in effect (meaning this test can be used) for the duration of the COVID-19 declaration under Section 564(b)(1) of the Act,  21 U.S.C. section 360bbb-3(b)(1), unless the authorization is terminated or revoked.  Performed at [facility], [address] [place], [place] [address]   Resp Panel by RT-PCR (Flu A&B, Covid) Nasopharyngeal Swab     Status: None   Collection Time: 11/19/20  6:16 PM   Specimen: Nasopharyngeal Swab; Nasopharyngeal(NP) swabs in vial transport medium  Result Value Ref Range Status   SARS Coronavirus 2 by RT PCR NEGATIVE NEGATIVE Final    Comment: (NOTE) SARS-CoV-2 target nucleic acids are NOT DETECTED.  The SARS-CoV-2 RNA is generally detectable in upper respiratory specimens during the acute phase of infection. The lowest concentration of SARS-CoV-2 viral copies this assay can detect is 138 copies/mL. A negative result does not preclude SARS-Cov-2 infection and should not be used as the sole basis for treatment or other patient management decisions. A negative result may occur with  improper specimen collection/handling, submission of specimen other than nasopharyngeal swab, presence of viral mutation(s) within the areas targeted by this assay, and inadequate number of viral copies(<138 copies/mL). A negative result must be combined with clinical observations, patient history, and epidemiological information. The expected result is Negative.  Fact Sheet for Patients:  BloggerCourse.com  Fact Sheet for Healthcare Providers:  SeriousBroker.it  This test is no t yet approved or cleared by the [place] FDA and  has been authorized for detection and/or diagnosis of SARS-CoV-2 by FDA under an Emergency Use Authorization (EUA). This EUA will remain  in effect (meaning this test can be used) for the duration of the COVID-19 declaration under Section 564(b)(1) of the Act, 21 U.S.C.section 360bbb-3(b)(1), unless the authorization is terminated  or revoked sooner.       Influenza A by PCR NEGATIVE NEGATIVE  Final   Influenza B by PCR NEGATIVE NEGATIVE Final    Comment: (NOTE) The Xpert Xpress SARS-CoV-2/FLU/RSV plus assay is intended as an aid in the diagnosis of influenza from Nasopharyngeal swab specimens and should not be used as a sole basis for treatment. Nasal washings and aspirates are unacceptable for Xpert Xpress SARS-CoV-2/FLU/RSV testing.  Fact Sheet for Patients: BloggerCourse.com  Fact Sheet for Healthcare Providers: SeriousBroker.it  This test is not yet approved or cleared by the [place] FDA and has been authorized for detection and/or diagnosis of SARS-CoV-2 by FDA under an Emergency Use Authorization (EUA). This EUA  will remain in effect (meaning this test can be used) for the duration of the COVID-19 declaration under Section 564(b)(1) of the Act, 21 U.S.C. section 360bbb-3(b)(1), unless the authorization is terminated or revoked.  Performed at [facility], [address] [place], [place] [address]     Radiology Studies: DG Skull 1-3 Views  Result Date: 11/19/2020 CLINICAL DATA:  Foreign body assessment for MRI. EXAM: SKULL - 1-3 VIEW COMPARISON:  None. FINDINGS: There is no evidence of skull fracture or other focal bone lesions. Dental hardware. No radiopaque foreign body. IMPRESSION: No radiopaque foreign body. Electronically Signed   By: Maudry Mayhew MD   On: 11/19/2020 23:28   DG Chest 1 View  Result Date: 11/19/2020 CLINICAL DATA:  Right BKA stump infection. EXAM: CHEST  1 VIEW COMPARISON:  05/20/2018 FINDINGS: The cardiac silhouette, mediastinal and hilar contours are within normal limits. The lungs are clear. The bony thorax is intact. IMPRESSION: No acute cardiopulmonary findings. Electronically Signed   By: Rudie Meyer M.D.   On: 11/19/2020 15:44   DG Pelvis 1-2 Views  Result Date: 11/19/2020 CLINICAL DATA:  Foreign body assessment for MRI EXAM: PELVIS - 1-2 VIEW COMPARISON:  None.  FINDINGS: There is no evidence of pelvic fracture or diastasis. No pelvic bone lesions are seen. IMPRESSION: No radiopaque foreign body. Electronically Signed   By: Maudry Mayhew MD   On: 11/19/2020 23:27   DG Knee 1-2 Views Left  Result Date: 11/19/2020 CLINICAL DATA:  Stump infection EXAM: LEFT KNEE - 1-2 VIEW COMPARISON:  None. FINDINGS: No acute fracture or dislocation. Diffuse osteopenia. Osseous irregularity and periosteal elevation of the distal tibia and fibular osteotomy sites. No obvious bony destruction. Soft tissue swelling of the distal stump with areas of soft tissue irregularity. IMPRESSION: Soft tissue swelling of the distal stump. Osseous irregularity and periosteal reaction of the tibia and fibular osteotomy sites which could which could be related to osteotomy, although osteomyelitis cannot be excluded. Finding could be further evaluated with MRI. Electronically Signed   By: Allegra Lai MD   On: 11/19/2020 15:57   DG Abdomen 1 View  Result Date: 11/19/2020 CLINICAL DATA:  Foreign body assessment for MRI. EXAM: ABDOMEN - 1 VIEW COMPARISON:  None. FINDINGS: The bowel gas pattern is normal. No radio-opaque calculi. Pelvic phleboliths. No radiopaque foreign body. IMPRESSION: No radiopaque foreign body. Electronically Signed   By: Maudry Mayhew MD   On: 11/19/2020 23:28   MR KNEE LEFT W WO CONTRAST  Result Date: 11/20/2020 CLINICAL DATA:  Open wound along the knee, possible osteomyelitis. Prior BKA 08/05/2020 EXAM: MRI OF THE LEFT KNEE WITHOUT AND WITH CONTRAST TECHNIQUE: Multiplanar, multisequence MR imaging of the left knee was performed both before and after administration of intravenous contrast. Osteomyelitis protocol was utilized rather than the orthopedic knee protocol, partially because of the severe motion artifact and partially because the main clinical concern is for infection. CONTRAST:  5mL GADAVIST GADOBUTROL 1 MMOL/ML IV SOLN COMPARISON:  Knee radiographs 11/19/2020  FINDINGS: Despite efforts by the technologist and patient, motion artifact is present on today's exam and could not be eliminated. This reduces exam sensitivity and specificity. Bones/Joint/Cartilage Prior amputation at the level of the proximal tibial shaft. Small chronic bone infarct in the proximal tibial metaphysis. Along the distal bony margin of the tibia, there is a somewhat irregular 0.8 by 2.5 cm region of edema and enhancement suspicious for a small amount of osteomyelitis. Adjacent periosteal reaction noted. This is somewhat more  edema and enhancement than I would otherwise expect 4 months after amputation. No knee joint effusion. Ligaments N/A Muscles and Tendons Substantial in generalized edema in the musculature of the calf. No definite intramuscular abscess in the visualized tissues. Soft tissues Generalized subcutaneous edema along the distal stump. Due partially to patient motion, not all of the soft tissues of the distal stump are included on imaging. Subcutaneous enhancement favoring cellulitis. IMPRESSION: 1. Greater than expected localized edema and enhancement along the distal margin of the tibial amputation site for 4 months post amputation, suspicious for low-grade osteomyelitis along the distal bony margin. 2. Subcutaneous and intramuscular edema in the calf with subcutaneous enhancement characteristic of cellulitis and likely myositis. No drainable abscess is identified. 3. Incidental small bone infarct in the proximal tibia, chronic. Electronically Signed   By: Gaylyn RongWalter  Liebkemann M.D.   On: 11/20/2020 05:14    Scheduled Meds:  aspirin EC  81 mg Oral Daily   atorvastatin  40 mg Oral Daily   enoxaparin (LOVENOX) injection  40 mg Subcutaneous Q24H   folic acid  1 mg Oral Daily   LORazepam  0-4 mg Intravenous Q6H   Or   LORazepam  0-4 mg Oral Q6H   [START ON 11/22/2020] LORazepam  0-4 mg Intravenous Q12H   Or   [START ON 11/22/2020] LORazepam  0-4 mg Oral Q12H   multivitamin with  minerals  1 tablet Oral Daily   nicotine  21 mg Transdermal Daily   thiamine  100 mg Oral Daily   Or   thiamine  100 mg Intravenous Daily   Continuous Infusions:  lactated ringers 75 mL/hr at 11/20/20 0540   piperacillin-tazobactam (ZOSYN)  IV Stopped (11/20/20 0540)   vancomycin       LOS: 1 day   Time spent: 40min  Azucena FallenWilliam C Hilma Steinhilber, DO [facility]  If 7PM-7AM, please contact night-coverage [email or web address]  11/20/2020, 8:23 AM

## 2020-11-20 NOTE — ED Notes (Signed)
Pt to MRI via stretcher.

## 2020-11-20 NOTE — Progress Notes (Signed)
Patient arrived to 4E from ED. Telemetry box applied, CCMD notified. Patient stated pain 0/10 on pain scale. Patient oriented to room and staff. Call bell in reach. Patient requesting prescription anti-itch cream for his bottom. MD notified.  Kenard Gower, RN

## 2020-11-20 NOTE — ED Notes (Signed)
Pt had breakfast this morning and does have a diet order but Dr. Natale Milch came to see pt and wants pt NPO moving forward until we hear from vascular about possible surgery on leg.  Per Dr. Natale Milch pt is ok to have sips with meds for now.

## 2020-11-21 ENCOUNTER — Inpatient Hospital Stay (HOSPITAL_COMMUNITY): Payer: Medicaid Other | Admitting: Certified Registered Nurse Anesthetist

## 2020-11-21 ENCOUNTER — Encounter (HOSPITAL_COMMUNITY)
Admission: EM | Disposition: A | Discharge status: Skilled Nursing Facility | Payer: Self-pay | Source: Home / Self Care | Attending: Internal Medicine

## 2020-11-21 ENCOUNTER — Encounter (HOSPITAL_COMMUNITY): Payer: Self-pay | Admitting: Family Medicine

## 2020-11-21 DIAGNOSIS — T8744 Infection of amputation stump, left lower extremity: Secondary | ICD-10-CM

## 2020-11-21 HISTORY — PX: AMPUTATION: SHX166

## 2020-11-21 LAB — BASIC METABOLIC PANEL
Anion gap: 9 (ref 5–15)
BUN: <5 mg/dL — ABNORMAL LOW (ref 6–20)
CO2: 25 mmol/L (ref 22–32)
Calcium: 8.3 mg/dL — ABNORMAL LOW (ref 8.9–10.3)
Chloride: 97 mmol/L — ABNORMAL LOW (ref 98–111)
Creatinine, Ser: 0.52 mg/dL — ABNORMAL LOW (ref 0.61–1.24)
GFR, Estimated: >60 mL/min (ref >60)
Glucose, Bld: 102 mg/dL — ABNORMAL HIGH (ref 70–99)
Potassium: 3 mmol/L — ABNORMAL LOW (ref 3.5–5.1)
Sodium: 131 mmol/L — ABNORMAL LOW (ref 135–145)

## 2020-11-21 LAB — URINE CULTURE

## 2020-11-21 LAB — CBC
HCT: 34.7 % — ABNORMAL LOW (ref 39.0–52.0)
Hemoglobin: 11.5 g/dL — ABNORMAL LOW (ref 13.0–17.0)
MCH: 27.6 pg (ref 26.0–34.0)
MCHC: 33.1 g/dL (ref 30.0–36.0)
MCV: 83.4 fL (ref 80.0–100.0)
Platelets: 147 10*3/uL — ABNORMAL LOW (ref 150–400)
RBC: 4.16 MIL/uL — ABNORMAL LOW (ref 4.22–5.81)
RDW: 15.5 % (ref 11.5–15.5)
WBC: 5.7 10*3/uL (ref 4.0–10.5)
nRBC: 0 % (ref 0.0–0.2)

## 2020-11-21 LAB — SURGICAL PCR SCREEN
MRSA, PCR: NEGATIVE
Staphylococcus aureus: POSITIVE — AB

## 2020-11-21 SURGERY — AMPUTATION, ABOVE KNEE
Anesthesia: General | Site: Knee | Laterality: Left

## 2020-11-21 MED ORDER — MUPIROCIN 2 % EX OINT
1.0000 "application " | TOPICAL_OINTMENT | Freq: Two times a day (BID) | CUTANEOUS | Status: AC
  Administered 2020-11-21 – 2020-11-25 (×9): 1 via NASAL
  Filled 2020-11-21 (×3): qty 22

## 2020-11-21 MED ORDER — CHLORHEXIDINE GLUCONATE CLOTH 2 % EX PADS
6.0000 | MEDICATED_PAD | Freq: Every day | CUTANEOUS | Status: AC
  Administered 2020-11-21 – 2020-11-25 (×5): 6 via TOPICAL

## 2020-11-21 MED ORDER — FENTANYL CITRATE (PF) 100 MCG/2ML IJ SOLN
INTRAMUSCULAR | Status: DC | PRN
  Administered 2020-11-21 (×2): 50 ug via INTRAVENOUS

## 2020-11-21 MED ORDER — OXYCODONE-ACETAMINOPHEN 7.5-325 MG PO TABS
1.0000 | ORAL_TABLET | ORAL | Status: DC | PRN
  Administered 2020-11-21 – 2020-11-26 (×15): 1 via ORAL
  Filled 2020-11-21 (×15): qty 1

## 2020-11-21 MED ORDER — MORPHINE SULFATE (PF) 2 MG/ML IV SOLN
2.0000 mg | INTRAVENOUS | Status: DC | PRN
  Administered 2020-11-21 – 2020-11-24 (×5): 2 mg via INTRAVENOUS
  Filled 2020-11-21 (×6): qty 1

## 2020-11-21 MED ORDER — ONDANSETRON HCL 4 MG/2ML IJ SOLN
INTRAMUSCULAR | Status: DC | PRN
  Administered 2020-11-21: 4 mg via INTRAVENOUS

## 2020-11-21 MED ORDER — BUPIVACAINE HCL (PF) 0.5 % IJ SOLN
INTRAMUSCULAR | Status: AC
  Filled 2020-11-21: qty 30

## 2020-11-21 MED ORDER — LACTATED RINGERS IV SOLN: INTRAVENOUS | Status: DC | PRN

## 2020-11-21 MED ORDER — MIDAZOLAM HCL 2 MG/2ML IJ SOLN
INTRAMUSCULAR | Status: AC
  Filled 2020-11-21: qty 2

## 2020-11-21 MED ORDER — CHLORHEXIDINE GLUCONATE 0.12 % MT SOLN
OROMUCOSAL | Status: AC
  Administered 2020-11-21: 15 mL via OROMUCOSAL
  Filled 2020-11-21: qty 15

## 2020-11-21 MED ORDER — BUPIVACAINE LIPOSOME 1.3 % IJ SUSP
INTRAMUSCULAR | Status: AC
  Filled 2020-11-21: qty 20

## 2020-11-21 MED ORDER — BUPIVACAINE LIPOSOME 1.3 % IJ SUSP
INTRAMUSCULAR | Status: DC | PRN
  Administered 2020-11-21: 92 mL

## 2020-11-21 MED ORDER — 0.9 % SODIUM CHLORIDE (POUR BTL) OPTIME
TOPICAL | Status: DC | PRN
  Administered 2020-11-21: 1000 mL

## 2020-11-21 MED ORDER — LIDOCAINE HCL (CARDIAC) PF 100 MG/5ML IV SOSY
PREFILLED_SYRINGE | INTRAVENOUS | Status: DC | PRN
  Administered 2020-11-21: 30 mg via INTRAVENOUS

## 2020-11-21 MED ORDER — FENTANYL CITRATE (PF) 250 MCG/5ML IJ SOLN
INTRAMUSCULAR | Status: AC
  Filled 2020-11-21: qty 5

## 2020-11-21 MED ORDER — CHLORHEXIDINE GLUCONATE 0.12 % MT SOLN: 15.0000 mL | Freq: Once | OROMUCOSAL | Status: AC

## 2020-11-21 MED ORDER — PROPOFOL 10 MG/ML IV BOLUS
INTRAVENOUS | Status: DC | PRN
Start: 2020-11-21 — End: 2020-11-21
  Administered 2020-11-21: 50 mg via INTRAVENOUS
  Administered 2020-11-21: 150 mg via INTRAVENOUS

## 2020-11-21 MED ORDER — PROPOFOL 10 MG/ML IV BOLUS
INTRAVENOUS | Status: AC
  Filled 2020-11-21: qty 20

## 2020-11-21 MED ORDER — SODIUM CHLORIDE 0.9 % IV SOLN: INTRAVENOUS | Status: DC | PRN

## 2020-11-21 MED ORDER — PHENYLEPHRINE HCL (PRESSORS) 10 MG/ML IV SOLN
INTRAVENOUS | Status: DC | PRN
  Administered 2020-11-21: 100 ug via INTRAVENOUS

## 2020-11-21 SURGICAL SUPPLY — 51 items
BAG COUNTER SPONGE SURGICOUNT (BAG) ×2 IMPLANT
BAG SPNG CNTER NS LX DISP (BAG) ×1
BLADE SAW GIGLI 510 (BLADE) ×3 IMPLANT
BNDG COHESIVE 6X5 TAN STRL LF (GAUZE/BANDAGES/DRESSINGS) ×2 IMPLANT
BNDG ELASTIC 4X5.8 VLCR STR LF (GAUZE/BANDAGES/DRESSINGS) ×2 IMPLANT
BNDG ELASTIC 6X5.8 VLCR STR LF (GAUZE/BANDAGES/DRESSINGS) ×2 IMPLANT
BNDG GAUZE ELAST 4 BULKY (GAUZE/BANDAGES/DRESSINGS) ×4 IMPLANT
CANISTER SUCT 3000ML PPV (MISCELLANEOUS) ×2 IMPLANT
CLIP VESOCCLUDE MED 6/CT (CLIP) ×2 IMPLANT
COVER SURGICAL LIGHT HANDLE (MISCELLANEOUS) ×2 IMPLANT
DRAIN CHANNEL 19F RND (DRAIN) IMPLANT
DRAPE HALF SHEET 40X57 (DRAPES) ×2 IMPLANT
DRAPE INCISE IOBAN 66X45 STRL (DRAPES) IMPLANT
DRAPE ORTHO SPLIT 77X108 STRL (DRAPES) ×4
DRAPE SURG ORHT 6 SPLT 77X108 (DRAPES) ×2 IMPLANT
DRESSING PREVENA PLUS CUSTOM (GAUZE/BANDAGES/DRESSINGS) IMPLANT
DRSG ADAPTIC 3X8 NADH LF (GAUZE/BANDAGES/DRESSINGS) ×2 IMPLANT
DRSG PREVENA PLUS CUSTOM (GAUZE/BANDAGES/DRESSINGS)
ELECT CAUTERY BLADE 6.4 (BLADE) ×2 IMPLANT
ELECT REM PT RETURN 9FT ADLT (ELECTROSURGICAL) ×2
ELECTRODE REM PT RTRN 9FT ADLT (ELECTROSURGICAL) ×1 IMPLANT
EVACUATOR SILICONE 100CC (DRAIN) IMPLANT
GAUZE 4X4 16PLY ~~LOC~~+RFID DBL (SPONGE) ×2 IMPLANT
GAUZE SPONGE 4X4 12PLY STRL (GAUZE/BANDAGES/DRESSINGS) ×4 IMPLANT
GLOVE SURG POLYISO LF SZ7.5 (GLOVE) ×2 IMPLANT
GLOVE SURG UNDER POLY LF SZ7.5 (GLOVE) ×2 IMPLANT
GOWN STRL REUS W/ TWL LRG LVL3 (GOWN DISPOSABLE) ×2 IMPLANT
GOWN STRL REUS W/ TWL XL LVL3 (GOWN DISPOSABLE) ×1 IMPLANT
GOWN STRL REUS W/TWL LRG LVL3 (GOWN DISPOSABLE) ×4
GOWN STRL REUS W/TWL XL LVL3 (GOWN DISPOSABLE) ×2
KIT BASIN OR (CUSTOM PROCEDURE TRAY) ×2 IMPLANT
KIT TURNOVER KIT B (KITS) ×2 IMPLANT
NDL SPNL 18GX3.5 QUINCKE PK (NEEDLE) IMPLANT
NEEDLE SPNL 18GX3.5 QUINCKE PK (NEEDLE) ×2 IMPLANT
NS IRRIG 1000ML POUR BTL (IV SOLUTION) ×2 IMPLANT
PACK GENERAL/GYN (CUSTOM PROCEDURE TRAY) ×2 IMPLANT
PAD ARMBOARD 7.5X6 YLW CONV (MISCELLANEOUS) ×4 IMPLANT
PREVENA RESTOR ARTHOFORM 46X30 (CANNISTER) IMPLANT
STAPLER VISISTAT 35W (STAPLE) ×2 IMPLANT
STOCKINETTE IMPERVIOUS LG (DRAPES) ×2 IMPLANT
SUT ETHILON 3 0 PS 1 (SUTURE) IMPLANT
SUT SILK 0 TIES 10X30 (SUTURE) ×2 IMPLANT
SUT SILK 2 0 (SUTURE)
SUT SILK 2-0 18XBRD TIE 12 (SUTURE) IMPLANT
SUT SILK 3 0 (SUTURE)
SUT SILK 3-0 18XBRD TIE 12 (SUTURE) IMPLANT
SUT VIC AB 2-0 CT1 18 (SUTURE) ×4 IMPLANT
SYR TB 1ML LUER SLIP (SYRINGE) ×1 IMPLANT
TOWEL GREEN STERILE (TOWEL DISPOSABLE) ×2 IMPLANT
UNDERPAD 30X36 HEAVY ABSORB (UNDERPADS AND DIAPERS) ×2 IMPLANT
WATER STERILE IRR 1000ML POUR (IV SOLUTION) ×2 IMPLANT

## 2020-11-21 NOTE — Progress Notes (Signed)
Pt back from surgery. Will continue to monitor pt.

## 2020-11-21 NOTE — Progress Notes (Signed)
Pt. Arrived to short stay via stretcher. Marcelino Duster, NT (from floor 4E)here with pt. And staying in the room to observe pt.

## 2020-11-21 NOTE — Anesthesia Postprocedure Evaluation (Signed)
Anesthesia Post Note  Patient: Jake Nguyen  Procedure(s) Performed: REVISION LEFT ABOVE KNEE AMPUTATION (Left: Knee)     Patient location during evaluation: PACU Anesthesia Type: General Level of consciousness: awake and alert Pain management: pain level controlled Vital Signs Assessment: post-procedure vital signs reviewed and stable Respiratory status: spontaneous breathing, nonlabored ventilation and respiratory function stable Cardiovascular status: stable and blood pressure returned to baseline Anesthetic complications: no   No notable events documented.  Last Vitals:  Vitals:   11/21/20 1330 11/21/20 1400  BP: 126/77 125/89  Pulse: 76 85  Resp: 14 19  Temp:    SpO2: 97% 99%    Last Pain:  Vitals:   11/21/20 1223  TempSrc:   PainSc: 0-No pain                 Beryle Lathe

## 2020-11-21 NOTE — Progress Notes (Signed)
Returned from PACU.  A&O.  CCMD notified.  Vital sign monitoring per protocol. IVC sitter at bedside.

## 2020-11-21 NOTE — Progress Notes (Signed)
Pt went down to surgery with this Clinical research associate. Will continue to monitor pt.

## 2020-11-21 NOTE — Progress Notes (Signed)
PROGRESS NOTE    Jake Nguyen  IPJ:[id number] DOB: [birth year] DOA: 11/19/2020 PCP: Patient, No Pcp Per (Inactive)   Brief Narrative:  Jake Nguyen is a 55 y.o. male well known to the hospital system with medical history significant for PAD, HLD, ETOH abuse, s/p left BKA in April of 2022 which he never followed up for and still has retained staples. His brother filed IVC paperwork on Jake Nguyen this am because he has not been taking care of himself. Brother stated to the ER provider that he has seen pus and maggots in the wound. Jake Nguyen admits that the wound has drained white purulent discharge.  He reports has been swollen and very red over the last few days. Patient was seen earlier this week for the same problem and was admitted but he left AMA a few hours later.  He has not been compliant with his medications or antibiotics in the past.   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 - secondary to noncompliance - Based on imagine - Vascular consulted - he is well known to their practice -surgery later today with [facility] - Patient has been IVC'd by his brother prior to being sent to ER - on 11/18/20 - Continue Zosyn/vancomycin -we will discuss discontinuation of antibiotics given the likely clean margins operatively, certainly disseminated infection is of concern given his profound noncompliance with wound infection likely over the past few weeks if not months. -Cultures preliminary negative, urine culture mixed species likely not a clean-catch, blood cultures and intraoperative cultures pending  Alcohol abuse with active withdrawal  - Continue CIWA protocol - Tolerating well -Family reports 120-160 ounces of beer/24h - last drink within 12 hours of admission   Hypokalemia Follow repeat  labs - replete as appropriate  Chronic comorbid diseases Peripheral arterial disease - continue ASA 81, Statin HLD - continue statin  DVT prophylaxis: Lovenox Code Status: Full Family Communication: Offered to call brother - patient requested to update himself  Status is: Inpt  Dispo: The patient is from: Home              Anticipated d/c is to: TBD              Anticipated d/c date is: >72h              Patient currently NOT medically stable for discharge  Consultants:  Vascular Sx  Procedures:  Left AKA 11/21/2020  Antimicrobials:  Vanc/Zosyn 8/11 - ongoing   Subjective: No acute issues/events overnight -denies nausea vomiting diarrhea constipation headache fevers or chills  Objective: Vitals:   11/21/20 0946 11/21/20 1153 11/21/20 1208 11/21/20 1223  BP: 129/76 (!) 139/96 131/90 (!) 144/96  Pulse: 81 99 97 (!) 106  Resp: 11 18 18 12   Temp: 98.8 F (37.1 C) 98.6 F (37 C)  (!) 97.4 F (36.3 C)  TempSrc: Oral     SpO2: 98% 98% 98% 100%  Weight:      Height:        Intake/Output Summary (Last 24 hours) at 11/21/2020 1234 Last data filed at 11/21/2020 1149 Gross per 24 hour  Intake 1855.6 ml  Output 2630 ml  Net -774.4 ml    Filed Weights   11/20/20 0218  Weight: 74.8 kg    Examination:  General:  Pleasantly resting in bed, No acute distress. HEENT:  Normocephalic atraumatic.  Sclerae nonicteric, noninjected.  Extraocular movements intact bilaterally. Neck:  Without mass or deformity.  Trachea is midline. Lungs:  Clear to auscultate bilaterally without rhonchi, wheeze, or rales. Heart:  Regular rate and rhythm.  Without murmurs, rubs, or gallops. Abdomen:  Soft, nontender, nondistended.  Without guarding or rebound. Extremities: Without cyanosis, clubbing, edema, left lower extremity bandage clean dry intact Vascular:  Dorsalis pedis and posterior tibial pulses palpable bilaterally. Skin:  Warm and dry, no erythema, no ulcerations.  Data Reviewed: I  have personally reviewed following labs and imaging studies  CBC: Recent Labs  Lab 11/19/20 1033 11/20/20 0500 11/21/20 0227  WBC 5.7 7.9 5.7  NEUTROABS 3.3  --   --   HGB 13.1 11.8* 11.5*  HCT 40.9 35.8* 34.7*  MCV 84.9 84.2 83.4  PLT 188 155 147*    Basic Metabolic Panel: Recent Labs  Lab 11/19/20 1033 11/19/20 2241 11/20/20 0500 11/21/20 0227  NA 136  --  133* 131*  K 3.4*  --  3.5 3.0*  CL 98  --  93* 97*  CO2 27  --  27 25  GLUCOSE 110*  --  92 102*  BUN <5*  --  <5* <5*  CREATININE 0.56*  --  0.60* 0.52*  CALCIUM 8.2*  --  8.6* 8.3*  MG  --  1.8  --   --   PHOS  --  3.3  --   --     GFR: Estimated Creatinine Clearance: 110.3 mL/min (A) (by C-G formula based on SCr of 0.52 mg/dL (L)).  Liver Function Tests: Recent Labs  Lab 11/19/20 1033  AST 37  ALT 23  ALKPHOS 67  BILITOT 0.3  PROT 8.4*  ALBUMIN 3.0*    No results for input(s): LIPASE, AMYLASE in the last 168 hours. No results for input(s): AMMONIA in the last 168 hours.  Coagulation Profile: Recent Labs  Lab 11/19/20 1033  INR 1.0    Cardiac Enzymes: No results for input(s): CKTOTAL, CKMB, CKMBINDEX, TROPONINI in the last 168 hours. BNP (last 3 results) No results for input(s): PROBNP in the last 8760 hours. HbA1C: No results for input(s): HGBA1C in the last 72 hours. CBG: No results for input(s): GLUCAP in the last 168 hours. Lipid Profile: No results for input(s): CHOL, HDL, LDLCALC, TRIG, CHOLHDL, LDLDIRECT in the last 72 hours. Thyroid Function Tests: No results for input(s): TSH, T4TOTAL, FREET4, T3FREE, THYROIDAB in the last 72 hours. Anemia Panel: No results for input(s): VITAMINB12, FOLATE, FERRITIN, TIBC, IRON, RETICCTPCT in the last 72 hours. Sepsis Labs: Recent Labs  Lab 11/19/20 1055 11/19/20 1754  LATICACIDVEN 2.1* 3.5*    Recent Results (from the past 240 hour(s))  Resp Panel by RT-PCR (Flu A&B, Covid) Nasopharyngeal Swab     Status: None   Collection Time:  11/14/20 10:40 AM   Specimen: Nasopharyngeal Swab; Nasopharyngeal(NP) swabs in vial transport medium  Result Value Ref Range Status   SARS Coronavirus 2 by RT PCR NEGATIVE NEGATIVE Final    Comment: (NOTE) SARS-CoV-2 target nucleic acids are NOT DETECTED.  The SARS-CoV-2 RNA is generally detectable in upper respiratory specimens during the acute phase of infection. The lowest concentration of SARS-CoV-2 viral copies this assay can detect is 138 copies/mL. A negative result does not preclude SARS-Cov-2 infection and should not be used as the sole basis for treatment or other patient management decisions. A negative result may occur with  improper specimen collection/handling, submission of specimen other than nasopharyngeal swab, presence of viral mutation(s) within the areas targeted by this assay, and inadequate number of viral copies(<138 copies/mL). A negative result must be combined with clinical observations, patient history, and epidemiological information. The expected result is Negative.  Fact Sheet for Patients:  BloggerCourse.com  Fact Sheet for Healthcare Providers:  SeriousBroker.it  This test is no t yet approved or cleared by the [place] FDA and  has been authorized for detection and/or diagnosis of SARS-CoV-2 by FDA under an Emergency Use Authorization (EUA). This EUA will remain  in effect (meaning this test can be used) for the duration of the COVID-19 declaration under Section 564(b)(1) of the Act, 21 U.S.C.section 360bbb-3(b)(1), unless the authorization is terminated  or revoked sooner.       Influenza A by PCR NEGATIVE NEGATIVE Final   Influenza B by PCR NEGATIVE NEGATIVE Final    Comment: (NOTE) The Xpert Xpress SARS-CoV-2/FLU/RSV plus assay is intended as an aid in the diagnosis of influenza from Nasopharyngeal swab specimens and should not be used as a sole basis for treatment. Nasal washings  and aspirates are unacceptable for Xpert Xpress SARS-CoV-2/FLU/RSV testing.  Fact Sheet for Patients: BloggerCourse.com  Fact Sheet for Healthcare Providers: SeriousBroker.it  This test is not yet approved or cleared by the [place] FDA and has been authorized for detection and/or diagnosis of SARS-CoV-2 by FDA under an Emergency Use Authorization (EUA). This EUA will remain in effect (meaning this test can be used) for the duration of the COVID-19 declaration under Section 564(b)(1) of the Act, 21 U.S.C. section 360bbb-3(b)(1), unless the authorization is terminated or revoked.  Performed at [facility], [address], [place], [place] [address]   Blood Culture (routine x 2)     Status: None (Preliminary result)   Collection Time: 11/19/20 10:38 AM   Specimen: BLOOD RIGHT ARM  Result Value Ref Range Status   Specimen Description BLOOD RIGHT ARM  Final   Special Requests   Final    BOTTLES DRAWN AEROBIC AND ANAEROBIC Blood Culture adequate volume   Culture   Final    NO GROWTH 2 DAYS Performed at [facility], [address] [place], [place] [address]    Report Status PENDING  Incomplete  Resp Panel by RT-PCR (Flu A&B, Covid) Nasopharyngeal Swab     Status: None   Collection Time: 11/19/20  6:16 PM   Specimen: Nasopharyngeal Swab; Nasopharyngeal(NP) swabs in vial transport medium  Result Value Ref Range Status   SARS Coronavirus 2 by RT PCR NEGATIVE NEGATIVE Final    Comment: (NOTE) SARS-CoV-2 target nucleic acids are NOT DETECTED.  The SARS-CoV-2 RNA is generally detectable in upper respiratory specimens during the acute phase of infection. The lowest concentration of SARS-CoV-2 viral copies this assay can detect is 138 copies/mL. A negative result does not preclude SARS-Cov-2 infection and should not be used as the sole basis for treatment or other patient management decisions. A negative  result may occur with  improper specimen collection/handling, submission of specimen other than nasopharyngeal swab, presence of viral mutation(s) within the areas targeted by this assay, and inadequate number of viral copies(<138 copies/mL). A negative result must be combined with clinical observations, patient history, and epidemiological information. The expected result is Negative.  Fact Sheet for Patients:  BloggerCourse.com  Fact Sheet for Healthcare Providers:  SeriousBroker.it  This test is no t yet approved or cleared by the [place] and  has been authorized for detection and/or diagnosis of SARS-CoV-2 by FDA under an Emergency Use Authorization (EUA). This EUA will remain  in effect (meaning this test can be used) for the duration of the COVID-19 declaration under Section 564(b)(1) of the Act, 21 U.S.C.section 360bbb-3(b)(1), unless the authorization is terminated  or revoked sooner.       Influenza A by PCR NEGATIVE NEGATIVE Final   Influenza B by PCR NEGATIVE NEGATIVE Final    Comment: (NOTE) The Xpert Xpress SARS-CoV-2/FLU/RSV plus assay is intended as an aid in the diagnosis of influenza from Nasopharyngeal swab specimens and should not be used as a sole basis for treatment. Nasal washings and aspirates are unacceptable for Xpert Xpress SARS-CoV-2/FLU/RSV testing.  Fact Sheet for Patients: BloggerCourse.com  Fact Sheet for Healthcare Providers: SeriousBroker.it  This test is not yet approved or cleared by the [place] FDA and has been authorized for detection and/or diagnosis of SARS-CoV-2 by FDA under an Emergency Use Authorization (EUA). This EUA will remain in effect (meaning this test can be used) for the duration of the COVID-19 declaration under Section 564(b)(1) of the Act, 21 U.S.C. section 360bbb-3(b)(1), unless the authorization is  terminated or revoked.  Performed at [facility], [address] [place], [place] [address]   Blood Culture (routine x 2)     Status: None (Preliminary result)   Collection Time: 11/19/20 10:35 PM   Specimen: BLOOD  Result Value Ref Range Status   Specimen Description BLOOD RIGHT HAND  Final   Special Requests   Final    BOTTLES DRAWN AEROBIC AND ANAEROBIC Blood Culture results may not be optimal due to an inadequate volume of blood received in culture bottles   Culture   Final    NO GROWTH 1 DAY Performed at [facility], [address] [place], [place] [address]    Report Status PENDING  Incomplete  Urine Culture     Status: Abnormal   Collection Time: 11/20/20 12:30 AM   Specimen: In/Out Cath Urine  Result Value Ref Range Status   Specimen Description IN/OUT CATH URINE  Final   Special Requests   Final    NONE Performed at [facility], [address] [place], [place] [address]    Culture MULTIPLE SPECIES PRESENT, SUGGEST RECOLLECTION (A)  Final   Report Status 11/21/2020 FINAL  Final  Surgical pcr screen     Status: Abnormal   Collection Time: 11/20/20 10:49 PM   Specimen: Nasal Mucosa; Nasal Swab  Result Value Ref Range Status   MRSA, PCR NEGATIVE NEGATIVE Final   Staphylococcus aureus POSITIVE (A) NEGATIVE Final    Comment: (NOTE) The Xpert SA Assay (FDA approved for NASAL specimens in patients 84 years of age and older), is one component of a comprehensive surveillance program. It is not intended to diagnose infection nor to guide or monitor treatment. Performed at [facility], [address] [place], [place] [address]      Radiology Studies: DG Skull 1-3 Views  Result Date: 11/19/2020 CLINICAL DATA:  Foreign body assessment for MRI. EXAM: SKULL - 1-3 VIEW COMPARISON:  None. FINDINGS: There is no evidence of skull fracture or other focal bone lesions. Dental hardware. No radiopaque foreign body. IMPRESSION: No radiopaque foreign  body. Electronically Signed   By: Maudry Mayhew MD   On: 11/19/2020 23:28   DG Chest 1 View  Result Date: 11/19/2020 CLINICAL DATA:  Right BKA stump infection. EXAM: CHEST  1 VIEW COMPARISON:  05/20/2018 FINDINGS: The cardiac silhouette, mediastinal and hilar contours are within normal limits. The lungs are clear. The bony thorax is intact. IMPRESSION: No acute cardiopulmonary findings. Electronically Signed   By: Rudie Meyer M.D.   On: 11/19/2020 15:44   DG Pelvis 1-2 Views  Result Date: 11/19/2020 CLINICAL DATA:  Foreign body assessment for MRI EXAM: PELVIS - 1-2 VIEW COMPARISON:  None. FINDINGS: There is no evidence of pelvic fracture or diastasis. No pelvic bone lesions are seen. IMPRESSION: No radiopaque foreign body. Electronically Signed   By: Maudry Mayhew MD   On: 11/19/2020 23:27   DG Knee 1-2 Views Left  Result Date: 11/19/2020 CLINICAL DATA:  Stump infection EXAM: LEFT KNEE - 1-2 VIEW COMPARISON:  None. FINDINGS: No acute fracture or dislocation. Diffuse osteopenia. Osseous irregularity and periosteal elevation of the distal tibia and fibular osteotomy sites. No obvious bony destruction. Soft tissue swelling of the distal stump with areas of soft tissue irregularity. IMPRESSION: Soft tissue swelling of the distal stump. Osseous irregularity and periosteal reaction of the tibia and fibular osteotomy sites which could which could be related to osteotomy, although osteomyelitis cannot be excluded. Finding could be further evaluated with MRI. Electronically Signed   By: Allegra Lai MD   On: 11/19/2020 15:57   DG Abdomen 1 View  Result Date: 11/19/2020 CLINICAL DATA:  Foreign body assessment for MRI. EXAM: ABDOMEN - 1 VIEW COMPARISON:  None. FINDINGS: The bowel gas pattern is normal. No radio-opaque calculi. Pelvic phleboliths. No radiopaque foreign body. IMPRESSION: No radiopaque foreign body. Electronically Signed   By: Maudry Mayhew MD   On: 11/19/2020 23:28   MR KNEE LEFT W WO  CONTRAST  Result Date: 11/20/2020 CLINICAL DATA:  Open wound along the knee, possible osteomyelitis. Prior BKA 08/05/2020 EXAM: MRI OF THE LEFT KNEE WITHOUT AND WITH CONTRAST TECHNIQUE: Multiplanar, multisequence MR imaging of the left knee was performed both before and after administration of intravenous contrast. Osteomyelitis protocol was utilized rather than the orthopedic knee protocol, partially because of the severe motion artifact and partially because the main clinical concern is for infection. CONTRAST:  7mL GADAVIST GADOBUTROL 1 MMOL/ML IV SOLN COMPARISON:  Knee radiographs 11/19/2020 FINDINGS: Despite efforts by the technologist and patient, motion artifact is present on today's exam and could not be eliminated. This reduces exam sensitivity and specificity. Bones/Joint/Cartilage Prior amputation at the level of the proximal tibial shaft. Small chronic bone infarct in the proximal tibial metaphysis. Along the distal bony margin of the tibia, there is a somewhat irregular 0.8 by 2.5 cm region of edema and enhancement suspicious for a small amount of osteomyelitis. Adjacent periosteal reaction noted. This is somewhat more edema and enhancement than I would otherwise expect 4 months after amputation. No knee joint effusion. Ligaments N/A Muscles and Tendons Substantial in generalized edema in the musculature of the calf. No definite intramuscular abscess in the visualized tissues. Soft tissues Generalized subcutaneous edema along the distal stump. Due partially to patient motion, not all of the soft tissues of the distal stump are included on imaging. Subcutaneous enhancement favoring cellulitis. IMPRESSION: 1. Greater than expected localized edema and enhancement along the distal margin of the tibial amputation site for 4 months post amputation, suspicious for low-grade osteomyelitis along the distal bony margin. 2. Subcutaneous and intramuscular edema in the calf with subcutaneous enhancement  characteristic of cellulitis and likely myositis. No drainable abscess is identified. 3. Incidental small bone infarct in the proximal tibia, chronic. Electronically  Signed   By: Gaylyn Rong M.D.   On: 11/20/2020 05:14    Scheduled Meds:  aspirin EC  81 mg Oral Daily   atorvastatin  40 mg Oral Daily   Chlorhexidine Gluconate Cloth  6 each Topical Q0600   enoxaparin (LOVENOX) injection  40 mg Subcutaneous Q24H   folic acid  1 mg Oral Daily   Gerhardt's butt cream   Topical TID   LORazepam  0-4 mg Intravenous Q6H   Or   LORazepam  0-4 mg Oral Q6H   [START ON 11/22/2020] LORazepam  0-4 mg Intravenous Q12H   Or   [START ON 11/22/2020] LORazepam  0-4 mg Oral Q12H   multivitamin with minerals  1 tablet Oral Daily   mupirocin ointment  1 application Nasal BID   nicotine  21 mg Transdermal Daily   thiamine  100 mg Oral Daily   Or   thiamine  100 mg Intravenous Daily   Continuous Infusions:  lactated ringers 75 mL/hr at 11/21/20 0139   piperacillin-tazobactam (ZOSYN)  IV 3.375 g (11/21/20 0807)   vancomycin 1,250 mg (11/21/20 0800)     LOS: 2 days   Time spent:  Azucena Fallen, DO [facility]  If 7PM-7AM, please contact night-coverage [email or web address]  11/21/2020, 12:34 PM

## 2020-11-21 NOTE — Anesthesia Procedure Notes (Signed)
Procedure Name: LMA Insertion Date/Time: 11/21/2020 10:32 AM Performed by: Gwenyth Allegra, CRNA Pre-anesthesia Checklist: Patient identified, Emergency Drugs available, Suction available, Patient being monitored and Timeout performed Patient Re-evaluated:Patient Re-evaluated prior to induction Oxygen Delivery Method: Circle system utilized Preoxygenation: Pre-oxygenation with 100% oxygen Induction Type: IV induction LMA: LMA inserted LMA Size: 4.0 Number of attempts: 1 Placement Confirmation: positive ETCO2 and breath sounds checked- equal and bilateral Tube secured with: Tape Dental Injury: Teeth and Oropharynx as per pre-operative assessment

## 2020-11-21 NOTE — Interval H&P Note (Signed)
History and Physical Interval Note:  11/21/2020 10:06 AM  Jake Nguyen  has presented today for surgery, with the diagnosis of Sepsis.  The various methods of treatment have been discussed with the patient and family. After consideration of risks, benefits and other options for treatment, the patient has consented to  Procedure(s): AMPUTATION ABOVE KNEE (Left) as a surgical intervention.  The patient's history has been reviewed, patient examined, no change in status, stable for surgery.  I have reviewed the patient's chart and labs.  Questions were answered to the patient's satisfaction.     Durene Cal

## 2020-11-21 NOTE — Anesthesia Preprocedure Evaluation (Addendum)
Anesthesia Evaluation  Patient identified by MRN, date of birth, ID band Patient awake    Reviewed: Allergy & Precautions, NPO status , Patient's Chart, lab work & pertinent test results  History of Anesthesia Complications Negative for: history of anesthetic complications  Airway Mallampati: II  TM Distance: >3 FB Neck ROM: Full    Dental  (+) Dental Advisory Given, Missing   Pulmonary Current Smoker and Patient abstained from smoking.,    Pulmonary exam normal        Cardiovascular negative cardio ROS Normal cardiovascular exam     Neuro/Psych PSYCHIATRIC DISORDERS Anxiety Depression  Neuromuscular disease    GI/Hepatic negative GI ROS, (+)     substance abuse  alcohol use and marijuana use,   Endo/Other  negative endocrine ROS  Renal/GU negative Renal ROS     Musculoskeletal negative musculoskeletal ROS (+)   Abdominal   Peds  Hematology negative hematology ROS (+)   Anesthesia Other Findings S/p left BKA   Reproductive/Obstetrics                            Anesthesia Physical Anesthesia Plan  ASA: 3  Anesthesia Plan: General   Post-op Pain Management:    Induction: Intravenous  PONV Risk Score and Plan: 3 and Treatment may vary due to age or medical condition, Ondansetron, Midazolam and Diphenhydramine  Airway Management Planned: LMA  Additional Equipment: None  Intra-op Plan:   Post-operative Plan: Extubation in OR  Informed Consent: I have reviewed the patients History and Physical, chart, labs and discussed the procedure including the risks, benefits and alternatives for the proposed anesthesia with the patient or authorized representative who has indicated his/her understanding and acceptance.     Dental advisory given  Plan Discussed with: CRNA and Anesthesiologist  Anesthesia Plan Comments:        Anesthesia Quick Evaluation

## 2020-11-21 NOTE — Transfer of Care (Signed)
Immediate Anesthesia Transfer of Care Note  Patient: Jake Nguyen  Procedure(s) Performed: REVISION LEFT ABOVE KNEE AMPUTATION (Left: Knee)  Patient Location: PACU  Anesthesia Type:General  Level of Consciousness: awake and alert   Airway & Oxygen Therapy: Patient Spontanous Breathing  Post-op Assessment: Report given to RN and Post -op Vital signs reviewed and stable  Post vital signs: Reviewed and stable  Last Vitals:  Vitals Value Taken Time  BP 139/96 11/21/20 1153  Temp    Pulse 103 11/21/20 1154  Resp 17 11/21/20 1154  SpO2 99 % 11/21/20 1154  Vitals shown include unvalidated device data.  Last Pain:  Vitals:   11/21/20 0946  TempSrc: Oral  PainSc: 0-No pain         Complications: No notable events documented.

## 2020-11-21 NOTE — Op Note (Signed)
    Patient name: Jake Nguyen MRN: [id number] DOB: March 22, 1966 Sex: male  11/21/2020 Pre-operative Diagnosis: infected left BKA Post-operative diagnosis:  Same Surgeon:  Durene Cal Assistants:  Wendi Maya, PA Procedure:   Left Above knee amputation Anesthesia:  General Blood Loss:  100cc Specimens:  left leg  Findings:  viable muscle, no evidence of infection at amputation site  Indications: This is a 55 year old gentleman who has previously undergone left below-knee amputation which has not healed and is grossly infected with osteomyelitis.  I discussed converting this to an above-knee amputation and he agreed.  Procedure:  The patient was identified in the holding area and taken to [facility] OR ROOM 12  The patient was then placed supine on the table. general anesthesia was administered.  The patient was prepped and draped in the usual sterile fashion.  A time out was called and antibiotics were administered.  A PA was necessary to expedite procedure and assist with technical details  Exparel was used to infiltrate the incision site.  A fishmouth incision was made above the knee.  Cautery was used about subcutaneous tissue and fascia.  I then used cautery to expose the femur circumferentially.  A periosteal elevator was used to elevate the periosteum.  I then proceeded to divide the muscle with cautery.  I isolated the neurovascular bundle and divided this between hemostats.  A Gigli saw was then used to transect the femur, beveling the anterior surface.  The leg was removed as a specimen.  It was sent to pathology.  I then further dissected out the neurovascular bundle.  I ligated the femoral nerve proximal to the cut edge of the bone and infiltrated this with Exparel.  The artery and vein were similarly ligated.  The wound was then irrigated.  Hemostasis was achieved.  I ended up resecting approximately an additional 2 cm of bone to make sure the flap closed without tension.  The fascia was  then reapproximated with interrupted 2-0 Vicryl.  The skin was closed with staples.  Sterile dressings were applied.  There were no immediate complications.   Disposition: To PACU stable.   Juleen China, M.D., [facility] [place] Office: [phone] Pager:  [phone]

## 2020-11-22 ENCOUNTER — Encounter (HOSPITAL_COMMUNITY): Payer: Self-pay | Admitting: Surgery

## 2020-11-22 LAB — BLOOD CULTURE ID PANEL (REFLEXED) - BCID2

## 2020-11-22 LAB — BASIC METABOLIC PANEL
Anion gap: 8 (ref 5–15)
BUN: <5 mg/dL — ABNORMAL LOW (ref 6–20)
CO2: 27 mmol/L (ref 22–32)
Calcium: 8 mg/dL — ABNORMAL LOW (ref 8.9–10.3)
Chloride: 96 mmol/L — ABNORMAL LOW (ref 98–111)
Creatinine, Ser: 0.52 mg/dL — ABNORMAL LOW (ref 0.61–1.24)
GFR, Estimated: >60 mL/min (ref >60)
Glucose, Bld: 114 mg/dL — ABNORMAL HIGH (ref 70–99)
Potassium: 2.8 mmol/L — ABNORMAL LOW (ref 3.5–5.1)
Sodium: 131 mmol/L — ABNORMAL LOW (ref 135–145)

## 2020-11-22 LAB — CBC
HCT: 32.6 % — ABNORMAL LOW (ref 39.0–52.0)
Hemoglobin: 10.9 g/dL — ABNORMAL LOW (ref 13.0–17.0)
MCH: 27.9 pg (ref 26.0–34.0)
MCHC: 33.4 g/dL (ref 30.0–36.0)
MCV: 83.4 fL (ref 80.0–100.0)
Platelets: 154 10*3/uL (ref 150–400)
RBC: 3.91 MIL/uL — ABNORMAL LOW (ref 4.22–5.81)
RDW: 15.2 % (ref 11.5–15.5)
WBC: 9.5 10*3/uL (ref 4.0–10.5)
nRBC: 0 % (ref 0.0–0.2)

## 2020-11-22 MED ORDER — METOPROLOL TARTRATE 5 MG/5ML IV SOLN
2.5000 mg | Freq: Four times a day (QID) | INTRAVENOUS | Status: DC | PRN
  Administered 2020-11-22: 2.5 mg via INTRAVENOUS
  Filled 2020-11-22 (×2): qty 5

## 2020-11-22 MED ORDER — METOPROLOL TARTRATE 5 MG/5ML IV SOLN: 2.5000 mg | Freq: Four times a day (QID) | INTRAVENOUS | Status: DC

## 2020-11-22 NOTE — Progress Notes (Signed)
PROGRESS NOTE    Jake Nguyen  UJW:[id number] DOB: [birth year] DOA: 11/19/2020 PCP: Patient, No Pcp Per (Inactive)   Brief Narrative:  Jake Nguyen is a 55 y.o. male well known to the hospital system with medical history significant for PAD, HLD, ETOH abuse, s/p left BKA in April of 2022 which he never followed up for and still has retained staples. His brother filed IVC paperwork on Jake Nguyen this am because he has not been taking care of himself. Brother stated to the ER provider that he has seen pus and maggots in the wound. Jake Nguyen admits that the wound has drained white purulent discharge.  He reports has been swollen and very red over the last few days. Patient was seen earlier this week for the same problem and was admitted but he left AMA a few hours later.  He has not been compliant with his medications or antibiotics in the past.   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 - secondary to noncompliance - Based on imagine - Vascular consulted - he is well known to their practice -surgery later today with AKA - Patient has been IVC'd by his brother prior to being sent to ER - on 11/18/20 - Continue Zosyn/vancomycin -we will discuss discontinuation of antibiotics given clean margins operatively, certainly disseminated infection is of concern given his profound noncompliance with wound infection likely over the past few weeks if not months. -Cultures preliminary negative, urine culture mixed species likely not a clean-catch, blood cultures pending  Alcohol abuse with active withdrawal  - Continue CIWA protocol - Tolerating well - Family reports 120-160 ounces of beer/24h - last drink within 12 hours of admission   Hypokalemia - Follow repeat labs - replete as  appropriate  Chronic comorbid diseases - Peripheral arterial disease - continue ASA 81, Statin - HLD - continue statin  DVT prophylaxis: Lovenox Code Status: Full Family Communication: Offered to call brother/sister - patient requested to update himself  Status is: Inpt  Dispo: The patient is from: Home              Anticipated d/c is to: TBD              Anticipated d/c date is: >72h              Patient currently NOT medically stable for discharge  Consultants:  Vascular Sx  Procedures:  Left AKA 11/21/2020  Antimicrobials:  Vanc/Zosyn 8/11 - ongoing   Subjective: No acute issues/events overnight -patient having some runs of tachycardia this morning but appears asymptomatic, somnolent if anything.  Patient review of systems markedly limited, answering simple yes no questions appears to be very difficult for the patient this morning, more than likely his simply choosing not to interact. True to form while leaving the room patient's sister called and he was able to wake up and discuss his care with her in full sentences only highlighting his noncompliance.  Objective: Vitals:   11/21/20 1600 11/21/20 1930 11/21/20 2300 11/22/20 0341  BP: (!) 136/96 132/77 121/77 107/73  Pulse: 95 91 99 (!) 127  Resp: Temp:  98.4 F (36.9 C) 98.5 F (36.9 C) 98.8 F (37.1 C)  TempSrc:  Oral Oral Oral  SpO2: 98% 95% 97% 93%  Weight:      Height:        Intake/Output Summary (Last 24 hours) at 11/22/2020 0743 Last data filed at 11/22/2020 0321 Gross per 24 hour  Intake 900 ml  Output 3580 ml  Net -2680 ml    Filed Weights   11/20/20 0218  Weight: 74.8 kg    Examination:  General:  Pleasantly resting in bed, No acute distress. HEENT:  Normocephalic atraumatic.  Sclerae nonicteric, noninjected.  Extraocular movements intact bilaterally. Neck:  Without mass or deformity.  Trachea is midline. Lungs:  Clear to auscultate bilaterally without rhonchi, wheeze, or  rales. Heart:  Regular rate and rhythm.  Without murmurs, rubs, or gallops. Abdomen:  Soft, nontender, nondistended.  Without guarding or rebound. Extremities: Without cyanosis, clubbing, edema, left lower extremity bandage clean dry intact Vascular:  Dorsalis pedis and posterior tibial pulses palpable bilaterally. Skin:  Warm and dry, no erythema, no ulcerations.  Data Reviewed: I have personally reviewed following labs and imaging studies  CBC: Recent Labs  Lab 11/19/20 1033 11/20/20 0500 11/21/20 0227 11/22/20 0050  WBC 5.7 7.9 5.7 9.5  NEUTROABS 3.3  --   --   --   HGB 13.1 11.8* 11.5* 10.9*  HCT 40.9 35.8* 34.7* 32.6*  MCV 84.9 84.2 83.4 83.4  PLT 188 155 147* 154    Basic Metabolic Panel: Recent Labs  Lab 11/19/20 1033 11/19/20 2241 11/20/20 0500 11/21/20 0227 11/22/20 0050  NA 136  --  133* 131* 131*  K 3.4*  --  3.5 3.0* 2.8*  CL 98  --  93* 97* 96*  CO2 27  --  27 25 27   GLUCOSE 110*  --  92 102* 114*  BUN <5*  --  <5* <5* <5*  CREATININE 0.56*  --  0.60* 0.52* 0.52*  CALCIUM 8.2*  --  8.6* 8.3* 8.0*  MG  --  1.8  --   --   --   PHOS  --  3.3  --   --   --     GFR: Estimated Creatinine Clearance: 110.3 mL/min (A) (by C-G formula based on SCr of 0.52 mg/dL (L)).  Liver Function Tests: Recent Labs  Lab 11/19/20 1033  AST 37  ALT 23  ALKPHOS 67  BILITOT 0.3  PROT 8.4*  ALBUMIN 3.0*    No results for input(s): LIPASE, AMYLASE in the last 168 hours. No results for input(s): AMMONIA in the last 168 hours.  Coagulation Profile: Recent Labs  Lab 11/19/20 1033  INR 1.0    Cardiac Enzymes: No results for input(s): CKTOTAL, CKMB, CKMBINDEX, TROPONINI in the last 168 hours. BNP (last 3 results) No results for input(s): PROBNP in the last 8760 hours. HbA1C: No results for input(s): HGBA1C in the last 72 hours. CBG: No results for input(s): GLUCAP in the last 168 hours. Lipid Profile: No results for input(s): CHOL, HDL, LDLCALC, TRIG, CHOLHDL,  LDLDIRECT in the last 72 hours. Thyroid Function Tests: No results for input(s): TSH, T4TOTAL, FREET4, T3FREE, THYROIDAB in the last 72 hours. Anemia Panel: No results for input(s): VITAMINB12, FOLATE, FERRITIN, TIBC, IRON, RETICCTPCT in the last 72 hours. Sepsis Labs: Recent Labs  Lab 11/19/20 1055 11/19/20 1754  LATICACIDVEN 2.1* 3.5*    Recent Results (from the past 240 hour(s))  Resp Panel by RT-PCR (Flu A&B, Covid) Nasopharyngeal Swab     Status: None   Collection Time: 11/14/20 10:40 AM   Specimen: Nasopharyngeal Swab; Nasopharyngeal(NP) swabs in vial transport medium  Result Value  Ref Range Status   SARS Coronavirus 2 by RT PCR NEGATIVE NEGATIVE Final    Comment: (NOTE) SARS-CoV-2 target nucleic acids are NOT DETECTED.  The SARS-CoV-2 RNA is generally detectable in upper respiratory specimens during the acute phase of infection. The lowest concentration of SARS-CoV-2 viral copies this assay can detect is 138 copies/mL. A negative result does not preclude SARS-Cov-2 infection and should not be used as the sole basis for treatment or other patient management decisions. A negative result may occur with  improper specimen collection/handling, submission of specimen other than nasopharyngeal swab, presence of viral mutation(s) within the areas targeted by this assay, and inadequate number of viral copies(<138 copies/mL). A negative result must be combined with clinical observations, patient history, and epidemiological information. The expected result is Negative.  Fact Sheet for Patients:  BloggerCourse.com  Fact Sheet for Healthcare Providers:  SeriousBroker.it  This test is no t yet approved or cleared by the [place] FDA and  has been authorized for detection and/or diagnosis of SARS-CoV-2 by FDA under an Emergency Use Authorization (EUA). This EUA will remain  in effect (meaning this test can be used) for the  duration of the COVID-19 declaration under Section 564(b)(1) of the Act, 21 U.S.C.section 360bbb-3(b)(1), unless the authorization is terminated  or revoked sooner.       Influenza A by PCR NEGATIVE NEGATIVE Final   Influenza B by PCR NEGATIVE NEGATIVE Final    Comment: (NOTE) The Xpert Xpress SARS-CoV-2/FLU/RSV plus assay is intended as an aid in the diagnosis of influenza from Nasopharyngeal swab specimens and should not be used as a sole basis for treatment. Nasal washings and aspirates are unacceptable for Xpert Xpress SARS-CoV-2/FLU/RSV testing.  Fact Sheet for Patients: BloggerCourse.com  Fact Sheet for Healthcare Providers: SeriousBroker.it  This test is not yet approved or cleared by the [place] FDA and has been authorized for detection and/or diagnosis of SARS-CoV-2 by FDA under an Emergency Use Authorization (EUA). This EUA will remain in effect (meaning this test can be used) for the duration of the COVID-19 declaration under Section 564(b)(1) of the Act, 21 U.S.C. section 360bbb-3(b)(1), unless the authorization is terminated or revoked.  Performed at [facility], [address] [place], [place] [address]   Blood Culture (routine x 2)     Status: None (Preliminary result)   Collection Time: 11/19/20 10:38 AM   Specimen: BLOOD RIGHT ARM  Result Value Ref Range Status   Specimen Description BLOOD RIGHT ARM  Final   Special Requests   Final    BOTTLES DRAWN AEROBIC AND ANAEROBIC Blood Culture adequate volume   Culture  Setup Time   Final    GRAM NEGATIVE RODS ANAEROBIC BOTTLE ONLY CRITICAL RESULT CALLED TO, READ BACK BY AND VERIFIED WITH: PHARMD J LEDFORD AT 0141 11/22/2020 BY L BENFIELD Performed at [facility], [address] [place], [place] [address]    Culture GRAM NEGATIVE RODS  Final   Report Status PENDING  Incomplete  Blood Culture ID Panel (Reflexed)     Status: None    Collection Time: 11/19/20 10:38 AM  Result Value Ref Range Status   Enterococcus faecalis NOT DETECTED NOT DETECTED Final   Enterococcus Faecium NOT DETECTED NOT DETECTED Final   Listeria monocytogenes NOT DETECTED NOT DETECTED Final   Staphylococcus species NOT DETECTED NOT DETECTED Final   Staphylococcus aureus (BCID) NOT DETECTED NOT DETECTED Final   Staphylococcus epidermidis NOT DETECTED NOT DETECTED Final   Staphylococcus lugdunensis  NOT DETECTED NOT DETECTED Final   Streptococcus species NOT DETECTED NOT DETECTED Final   Streptococcus agalactiae NOT DETECTED NOT DETECTED Final   Streptococcus pneumoniae NOT DETECTED NOT DETECTED Final   Streptococcus pyogenes NOT DETECTED NOT DETECTED Final   A.calcoaceticus-baumannii NOT DETECTED NOT DETECTED Final   Bacteroides fragilis NOT DETECTED NOT DETECTED Final   Enterobacterales NOT DETECTED NOT DETECTED Final   Enterobacter cloacae complex NOT DETECTED NOT DETECTED Final   Escherichia coli NOT DETECTED NOT DETECTED Final   Klebsiella aerogenes NOT DETECTED NOT DETECTED Final   Klebsiella oxytoca NOT DETECTED NOT DETECTED Final   Klebsiella pneumoniae NOT DETECTED NOT DETECTED Final   Proteus species NOT DETECTED NOT DETECTED Final   Salmonella species NOT DETECTED NOT DETECTED Final   Serratia marcescens NOT DETECTED NOT DETECTED Final   Haemophilus influenzae NOT DETECTED NOT DETECTED Final   Neisseria meningitidis NOT DETECTED NOT DETECTED Final   Pseudomonas aeruginosa NOT DETECTED NOT DETECTED Final   Stenotrophomonas maltophilia NOT DETECTED NOT DETECTED Final   Candida albicans NOT DETECTED NOT DETECTED Final   Candida auris NOT DETECTED NOT DETECTED Final   Candida glabrata NOT DETECTED NOT DETECTED Final   Candida krusei NOT DETECTED NOT DETECTED Final   Candida parapsilosis NOT DETECTED NOT DETECTED Final   Candida tropicalis NOT DETECTED NOT DETECTED Final   Cryptococcus neoformans/gattii NOT DETECTED NOT DETECTED  Final    Comment: Performed at [facility], [address], [place], [place] [address]  Resp Panel by RT-PCR (Flu A&B, Covid) Nasopharyngeal Swab     Status: None   Collection Time: 11/19/20  6:16 PM   Specimen: Nasopharyngeal Swab; Nasopharyngeal(NP) swabs in vial transport medium  Result Value Ref Range Status   SARS Coronavirus 2 by RT PCR NEGATIVE NEGATIVE Final    Comment: (NOTE) SARS-CoV-2 target nucleic acids are NOT DETECTED.  The SARS-CoV-2 RNA is generally detectable in upper respiratory specimens during the acute phase of infection. The lowest concentration of SARS-CoV-2 viral copies this assay can detect is 138 copies/mL. A negative result does not preclude SARS-Cov-2 infection and should not be used as the sole basis for treatment or other patient management decisions. A negative result may occur with  improper specimen collection/handling, submission of specimen other than nasopharyngeal swab, presence of viral mutation(s) within the areas targeted by this assay, and inadequate number of viral copies(<138 copies/mL). A negative result must be combined with clinical observations, patient history, and epidemiological information. The expected result is Negative.  Fact Sheet for Patients:  BloggerCourse.com  Fact Sheet for Healthcare Providers:  SeriousBroker.it  This test is no t yet approved or cleared by the [place] FDA and  has been authorized for detection and/or diagnosis of SARS-CoV-2 by FDA under an Emergency Use Authorization (EUA). This EUA will remain  in effect (meaning this test can be used) for the duration of the COVID-19 declaration under Section 564(b)(1) of the Act, 21 U.S.C.section 360bbb-3(b)(1), unless the authorization is terminated  or revoked sooner.       Influenza A by PCR NEGATIVE NEGATIVE Final   Influenza B by PCR NEGATIVE NEGATIVE Final    Comment: (NOTE) The Xpert Xpress  SARS-CoV-2/FLU/RSV plus assay is intended as an aid in the diagnosis of influenza from Nasopharyngeal swab specimens and should not be used as a sole basis for treatment. Nasal washings and aspirates are unacceptable for Xpert Xpress SARS-CoV-2/FLU/RSV testing.  Fact Sheet for Patients: BloggerCourse.com  Fact Sheet for Healthcare Providers: SeriousBroker.it  This test is not yet approved or cleared by the [place] FDA and has been authorized for detection and/or diagnosis of SARS-CoV-2 by FDA under an Emergency Use Authorization (EUA). This EUA will remain in effect (meaning this test can be used) for the duration of the COVID-19 declaration under Section 564(b)(1) of the Act, 21 U.S.C. section 360bbb-3(b)(1), unless the authorization is terminated or revoked.  Performed at [facility], [address] [place], [place] [address]   Blood Culture (routine x 2)     Status: None (Preliminary result)   Collection Time: 11/19/20 10:35 PM   Specimen: BLOOD  Result Value Ref Range Status   Specimen Description BLOOD RIGHT HAND  Final   Special Requests   Final    BOTTLES DRAWN AEROBIC AND ANAEROBIC Blood Culture results may not be optimal due to an inadequate volume of blood received in culture bottles   Culture   Final    NO GROWTH 1 DAY Performed at [facility], [address] [place], [place] [address]    Report Status PENDING  Incomplete  Urine Culture     Status: Abnormal   Collection Time: 11/20/20 12:30 AM   Specimen: In/Out Cath Urine  Result Value Ref Range Status   Specimen Description IN/OUT CATH URINE  Final   Special Requests   Final    NONE Performed at [facility], [address] [place], [place] [address]    Culture MULTIPLE SPECIES PRESENT, SUGGEST RECOLLECTION (A)  Final   Report Status 11/21/2020 FINAL  Final  Surgical pcr screen     Status: Abnormal   Collection Time: 11/20/20  10:49 PM   Specimen: Nasal Mucosa; Nasal Swab  Result Value Ref Range Status   MRSA, PCR NEGATIVE NEGATIVE Final   Staphylococcus aureus POSITIVE (A) NEGATIVE Final    Comment: (NOTE) The Xpert SA Assay (FDA approved for NASAL specimens in patients 53 years of age and older), is one component of a comprehensive surveillance program. It is not intended to diagnose infection nor to guide or monitor treatment. Performed at [facility], [address] [place], [place] [address]      Radiology Studies: No results found.  Scheduled Meds:  aspirin EC  81 mg Oral Daily   atorvastatin  40 mg Oral Daily   Chlorhexidine Gluconate Cloth  6 each Topical Q0600   enoxaparin (LOVENOX) injection  40 mg Subcutaneous Q24H   folic acid  1 mg Oral Daily   Gerhardt's butt cream   Topical TID   LORazepam  0-4 mg Intravenous Q12H   Or   LORazepam  0-4 mg Oral Q12H   multivitamin with minerals  1 tablet Oral Daily   mupirocin ointment  1 application Nasal BID   nicotine  21 mg Transdermal Daily   thiamine  100 mg Oral Daily   Or   thiamine  100 mg Intravenous Daily   Continuous Infusions:  lactated ringers 75 mL/hr at 11/21/20 0139   piperacillin-tazobactam (ZOSYN)  IV 3.375 g (11/22/20 0200)   vancomycin 1,250 mg (11/21/20 2114)     LOS: 3 days   Time spent: 40min  Azucena FallenWilliam C Kymberly Blomberg, DO [facility]  If 7PM-7AM, please contact night-coverage [email or web address]  11/22/2020, 7:43 AM

## 2020-11-22 NOTE — Progress Notes (Signed)
PHARMACY - PHYSICIAN COMMUNICATION CRITICAL VALUE ALERT - BLOOD CULTURE IDENTIFICATION (BCID)  Jake Nguyen is an 55 y.o. male who presented to [facility] on 11/19/2020 with a chief complaint of wound infection  8/11 Blood>>>Gram neg rods, no organism detected  Assessment:  wound infection, s/p L AKA 8/13, afebrile  Name of physician (or Provider) Contacted: Dr. Margo Aye  Current antibiotics: Vancomycin/Zosyn  Changes to prescribed antibiotics recommended:  No changes for now  Results for orders placed or performed during the hospital encounter of 11/19/20  Blood Culture ID Panel (Reflexed) (Collected: 11/19/2020 10:38 AM)  Result Value Ref Range   Enterococcus faecalis NOT DETECTED NOT DETECTED   Enterococcus Faecium NOT DETECTED NOT DETECTED   Listeria monocytogenes NOT DETECTED NOT DETECTED   Staphylococcus species NOT DETECTED NOT DETECTED   Staphylococcus aureus (BCID) NOT DETECTED NOT DETECTED   Staphylococcus epidermidis NOT DETECTED NOT DETECTED   Staphylococcus lugdunensis NOT DETECTED NOT DETECTED   Streptococcus species NOT DETECTED NOT DETECTED   Streptococcus agalactiae NOT DETECTED NOT DETECTED   Streptococcus pneumoniae NOT DETECTED NOT DETECTED   Streptococcus pyogenes NOT DETECTED NOT DETECTED   A.calcoaceticus-baumannii NOT DETECTED NOT DETECTED   Bacteroides fragilis NOT DETECTED NOT DETECTED   Enterobacterales NOT DETECTED NOT DETECTED   Enterobacter cloacae complex NOT DETECTED NOT DETECTED   Escherichia coli NOT DETECTED NOT DETECTED   Klebsiella aerogenes NOT DETECTED NOT DETECTED   Klebsiella oxytoca NOT DETECTED NOT DETECTED   Klebsiella pneumoniae NOT DETECTED NOT DETECTED   Proteus species NOT DETECTED NOT DETECTED   Salmonella species NOT DETECTED NOT DETECTED   Serratia marcescens NOT DETECTED NOT DETECTED   Haemophilus influenzae NOT DETECTED NOT DETECTED   Neisseria meningitidis NOT DETECTED NOT DETECTED   Pseudomonas aeruginosa NOT DETECTED  NOT DETECTED   Stenotrophomonas maltophilia NOT DETECTED NOT DETECTED   Candida albicans NOT DETECTED NOT DETECTED   Candida auris NOT DETECTED NOT DETECTED   Candida glabrata NOT DETECTED NOT DETECTED   Candida krusei NOT DETECTED NOT DETECTED   Candida parapsilosis NOT DETECTED NOT DETECTED   Candida tropicalis NOT DETECTED NOT DETECTED   Cryptococcus neoformans/gattii NOT DETECTED NOT DETECTED   Abran Duke, PharmD, BCPS Clinical Pharmacist Phone: [phone]

## 2020-11-22 NOTE — Progress Notes (Addendum)
  Progress Note    11/22/2020 7:42 AM 1 Day Post-Op  Subjective:  RN at bedside. Patient OOB to chair. Was agitated overnight and didn't sleep.    Vitals:   11/21/20 2300 11/22/20 0341  BP: 121/77 107/73  Pulse: 99 (!) 127  Resp: 18 19  Temp: 98.5 F (36.9 C) 98.8 F (37.1 C)  SpO2: 97% 93%    Physical Exam: General appearance: Awake, alert in no apparent distress Cardiac: Heart rate and rhythm are regular Respirations: Nonlabored Extremities: Amputation site incision dressing is dry and intact. Right foot warm with intact motor and sensation  CBC    Component Value Date/Time   WBC 9.5 11/22/2020 0050   RBC 3.91 (L) 11/22/2020 0050   HGB 10.9 (L) 11/22/2020 0050   HCT 32.6 (L) 11/22/2020 0050   HCT 36.4 (L) 05/20/2018 1923   PLT 154 11/22/2020 0050   MCV 83.4 11/22/2020 0050   MCH 27.9 11/22/2020 0050   MCHC 33.4 11/22/2020 0050   RDW 15.2 11/22/2020 0050   LYMPHSABS 1.6 11/19/2020 1033   MONOABS 0.6 11/19/2020 1033   EOSABS 0.2 11/19/2020 1033   BASOSABS 0.1 11/19/2020 1033    BMET    Component Value Date/Time   NA 131 (L) 11/22/2020 0050   K 2.8 (L) 11/22/2020 0050   CL 96 (L) 11/22/2020 0050   CO2 27 11/22/2020 0050   GLUCOSE 114 (H) 11/22/2020 0050   BUN <5 (L) 11/22/2020 0050   CREATININE 0.52 (L) 11/22/2020 0050   CALCIUM 8.0 (L) 11/22/2020 0050   GFRNONAA >60 11/22/2020 0050   GFRAA >60 11/12/2019 0359     Intake/Output Summary (Last 24 hours) at 11/22/2020 0742 Last data filed at 11/22/2020 0321 Gross per 24 hour  Intake 900 ml  Output 3580 ml  Net -2680 ml    HOSPITAL MEDICATIONS Scheduled Meds:  aspirin EC  81 mg Oral Daily   atorvastatin  40 mg Oral Daily   Chlorhexidine Gluconate Cloth  6 each Topical Q0600   enoxaparin (LOVENOX) injection  40 mg Subcutaneous Q24H   folic acid  1 mg Oral Daily   Gerhardt's butt cream   Topical TID   LORazepam  0-4 mg Intravenous Q12H   Or   LORazepam  0-4 mg Oral Q12H   multivitamin with  minerals  1 tablet Oral Daily   mupirocin ointment  1 application Nasal BID   nicotine  21 mg Transdermal Daily   thiamine  100 mg Oral Daily   Or   thiamine  100 mg Intravenous Daily   Continuous Infusions:  lactated ringers 75 mL/hr at 11/21/20 0139   piperacillin-tazobactam (ZOSYN)  IV 3.375 g (11/22/20 0200)   vancomycin 1,250 mg (11/21/20 2114)   PRN Meds:.acetaminophen **OR** acetaminophen, albuterol, alum & mag hydroxide-simeth, diphenhydrAMINE-zinc acetate, ibuprofen, LORazepam **OR** LORazepam, metoprolol tartrate, morphine injection, ondansetron **OR** ondansetron (ZOFRAN) IV, oxyCODONE-acetaminophen, senna-docusate, zolpidem  Assessment and Plan: POD 1  left AKA conversion from infected BKA. VSS. Afebrile. Hgb down approx one-half gram. Continue antibiotics for now. Take down dressing tomorrow.   Wendi Maya, PA-C [facility] [phone] 11/22/2020  7:42 AM   Plan for dressing change tomorrow  Durene Cal

## 2020-11-23 LAB — CBC
HCT: 30 % — ABNORMAL LOW (ref 39.0–52.0)
Hemoglobin: 10.2 g/dL — ABNORMAL LOW (ref 13.0–17.0)
MCH: 28.1 pg (ref 26.0–34.0)
MCHC: 34 g/dL (ref 30.0–36.0)
MCV: 82.6 fL (ref 80.0–100.0)
Platelets: 177 10*3/uL (ref 150–400)
RBC: 3.63 MIL/uL — ABNORMAL LOW (ref 4.22–5.81)
RDW: 15 % (ref 11.5–15.5)
WBC: 11 10*3/uL — ABNORMAL HIGH (ref 4.0–10.5)
nRBC: 0 % (ref 0.0–0.2)

## 2020-11-23 LAB — BASIC METABOLIC PANEL
Anion gap: 7 (ref 5–15)
BUN: <5 mg/dL — ABNORMAL LOW (ref 6–20)
CO2: 27 mmol/L (ref 22–32)
Calcium: 8.2 mg/dL — ABNORMAL LOW (ref 8.9–10.3)
Chloride: 98 mmol/L (ref 98–111)
Creatinine, Ser: 0.53 mg/dL — ABNORMAL LOW (ref 0.61–1.24)
GFR, Estimated: >60 mL/min (ref >60)
Glucose, Bld: 107 mg/dL — ABNORMAL HIGH (ref 70–99)
Potassium: 2.8 mmol/L — ABNORMAL LOW (ref 3.5–5.1)
Sodium: 132 mmol/L — ABNORMAL LOW (ref 135–145)

## 2020-11-23 MED ORDER — POTASSIUM CHLORIDE CRYS ER 20 MEQ PO TBCR
40.0000 meq | EXTENDED_RELEASE_TABLET | ORAL | Status: AC
  Administered 2020-11-23 (×3): 40 meq via ORAL
  Filled 2020-11-23 (×3): qty 2

## 2020-11-23 NOTE — Social Work (Signed)
Patient IVC granted 11/19/2020- expires 11/25/2020  Antony Blackbird, MSW, LCSW Clinical Social Worker

## 2020-11-23 NOTE — Progress Notes (Signed)
PROGRESS NOTE    Jake Nguyen  ZOX:[id number] DOB: 08-08-1965 DOA: 11/19/2020 PCP: Patient, No Pcp Per (Inactive)   Brief Narrative:  Jake Nguyen is a 55 y.o. male well known to the hospital system with medical history significant for PAD, HLD, ETOH abuse, s/p left BKA in April of 2022 which he never followed up for and still has retained staples. His brother filed IVC paperwork on [name] this am because he has not been taking care of himself. Brother stated to the ER provider that he has seen pus and maggots in the wound. [name] admits that the wound has drained white purulent discharge.  He reports has been swollen and very red over the last few days. Patient was seen earlier this week for the same problem and was admitted but he left AMA a few hours later.  He has not been compliant with his medications or antibiotics in the past.   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 - secondary to noncompliance Cannot rule out GNR bacteremia - Based on imagine - Vascular consulted - he is well known to their practice -surgery later today with AKA - Patient has been IVC'd by his brother prior to being sent to ER - on 11/18/20 - Continue Zosyn - vancomycin discontinued given GNR blood cultures.  Alcohol abuse with active withdrawal  - Continue CIWA protocol - Tolerating well - Family reports 120-160 ounces of beer/24h - last drink within 12 hours of admission   Hypokalemia - Follow repeat labs - replete as appropriate  Chronic comorbid diseases - Peripheral arterial disease - continue ASA 81, Statin - HLD - continue statin  DVT prophylaxis: Lovenox Code Status: Full Family Communication: Offered to call brother/sister - patient requested to update himself  Status is:  Inpt  Dispo: The patient is from: Home              Anticipated d/c is to: TBD              Anticipated d/c date is: >72h              Patient currently NOT medically stable for discharge  Consultants:  Vascular Sx  Procedures:  Left AKA 11/21/2020  Antimicrobials:  Vanc/Zosyn 8/11 - ongoing   Subjective: No acute issues/events overnight - Patient much more awake/alert today - ROS negative other than lingering post-op pain which is well controlled at this time.  Objective: Vitals:   11/22/20 1800 11/22/20 1918 11/22/20 2318 11/23/20 0324  BP: 112/74 128/78 137/86 117/79  Pulse: (!) 118 100 91 (!) 107  Resp: (!) 21 20 16 18   Temp: (!) 100.5 F (38.1 C) 98.3 F (36.8 C) 98.3 F (36.8 C) 98.7 F (37.1 C)  TempSrc: Oral Oral Oral Oral  SpO2: 96% 98% 99% 100%  Weight:      Height:        Intake/Output Summary (Last 24 hours) at 11/23/2020 0739 Last data filed at 11/23/2020 0342 Gross per 24 hour  Intake 300 ml  Output 2125 ml  Net -1825 ml    Filed Weights   11/20/20 0218  Weight: 74.8 kg    Examination:  General:  Pleasantly resting in bed, No acute distress. HEENT:  Normocephalic atraumatic.  Sclerae nonicteric, noninjected.  Extraocular movements  intact bilaterally. Neck:  Without mass or deformity.  Trachea is midline. Lungs:  Clear to auscultate bilaterally without rhonchi, wheeze, or rales. Heart:  Regular rate and rhythm.  Without murmurs, rubs, or gallops. Abdomen:  Soft, nontender, nondistended.  Without guarding or rebound. Extremities: Without cyanosis, clubbing, edema, left lower extremity bandage clean dry intact Vascular:  Dorsalis pedis and posterior tibial pulses palpable bilaterally. Skin:  Warm and dry, no erythema, no ulcerations.  Data Reviewed: I have personally reviewed following labs and imaging studies  CBC: Recent Labs  Lab 11/19/20 1033 11/20/20 0500 11/21/20 0227 11/22/20 0050 11/23/20 0118  WBC 5.7 7.9 5.7 9.5 11.0*   NEUTROABS 3.3  --   --   --   --   HGB 13.1 11.8* 11.5* 10.9* 10.2*  HCT 40.9 35.8* 34.7* 32.6* 30.0*  MCV 84.9 84.2 83.4 83.4 82.6  PLT 188 155 147* 154 177    Basic Metabolic Panel: Recent Labs  Lab 11/19/20 1033 11/19/20 2241 11/20/20 0500 11/21/20 0227 11/22/20 0050 11/23/20 0118  NA 136  --  133* 131* 131* 132*  K 3.4*  --  3.5 3.0* 2.8* 2.8*  CL 98  --  93* 97* 96* 98  CO2 27  --  GLUCOSE 110*  --  92 102* 114* 107*  BUN <5*  --  <5* <5* <5* <5*  CREATININE 0.56*  --  0.60* 0.52* 0.52* 0.53*  CALCIUM 8.2*  --  8.6* 8.3* 8.0* 8.2*  MG  --  1.8  --   --   --   --   PHOS  --  3.3  --   --   --   --     GFR: Estimated Creatinine Clearance: 110.3 mL/min (A) (by C-G formula based on SCr of 0.53 mg/dL (L)).  Liver Function Tests: Recent Labs  Lab 11/19/20 1033  AST 37  ALT 23  ALKPHOS 67  BILITOT 0.3  PROT 8.4*  ALBUMIN 3.0*    No results for input(s): LIPASE, AMYLASE in the last 168 hours. No results for input(s): AMMONIA in the last 168 hours.  Coagulation Profile: Recent Labs  Lab 11/19/20 1033  INR 1.0    Cardiac Enzymes: No results for input(s): CKTOTAL, CKMB, CKMBINDEX, TROPONINI in the last 168 hours. BNP (last 3 results) No results for input(s): PROBNP in the last 8760 hours. HbA1C: No results for input(s): HGBA1C in the last 72 hours. CBG: No results for input(s): GLUCAP in the last 168 hours. Lipid Profile: No results for input(s): CHOL, HDL, LDLCALC, TRIG, CHOLHDL, LDLDIRECT in the last 72 hours. Thyroid Function Tests: No results for input(s): TSH, T4TOTAL, FREET4, T3FREE, THYROIDAB in the last 72 hours. Anemia Panel: No results for input(s): VITAMINB12, FOLATE, FERRITIN, TIBC, IRON, RETICCTPCT in the last 72 hours. Sepsis Labs: Recent Labs  Lab 11/19/20 1055 11/19/20 1754  LATICACIDVEN 2.1* 3.5*    Recent Results (from the past 240 hour(s))  Resp Panel by RT-PCR (Flu A&B, Covid) Nasopharyngeal Swab     Status: None    Collection Time: 11/14/20 10:40 AM   Specimen: Nasopharyngeal Swab; Nasopharyngeal(NP) swabs in vial transport medium  Result Value Ref Range Status   SARS Coronavirus 2 by RT PCR NEGATIVE NEGATIVE Final    Comment: (NOTE) SARS-CoV-2 target nucleic acids are NOT DETECTED.  The SARS-CoV-2 RNA is generally detectable in upper respiratory specimens during the acute phase of infection. The lowest concentration of SARS-CoV-2 viral copies this assay can detect is 138  copies/mL. A negative result does not preclude SARS-Cov-2 infection and should not be used as the sole basis for treatment or other patient management decisions. A negative result may occur with  improper specimen collection/handling, submission of specimen other than nasopharyngeal swab, presence of viral mutation(s) within the areas targeted by this assay, and inadequate number of viral copies(<138 copies/mL). A negative result must be combined with clinical observations, patient history, and epidemiological information. The expected result is Negative.  Fact Sheet for Patients:  BloggerCourse.com  Fact Sheet for Healthcare Providers:  SeriousBroker.it  This test is no t yet approved or cleared by the [place] FDA and  has been authorized for detection and/or diagnosis of SARS-CoV-2 by FDA under an Emergency Use Authorization (EUA). This EUA will remain  in effect (meaning this test can be used) for the duration of the COVID-19 declaration under Section 564(b)(1) of the Act, 21 U.S.C.section 360bbb-3(b)(1), unless the authorization is terminated  or revoked sooner.       Influenza A by PCR NEGATIVE NEGATIVE Final   Influenza B by PCR NEGATIVE NEGATIVE Final    Comment: (NOTE) The Xpert Xpress SARS-CoV-2/FLU/RSV plus assay is intended as an aid in the diagnosis of influenza from Nasopharyngeal swab specimens and should not be used as a sole basis for treatment.  Nasal washings and aspirates are unacceptable for Xpert Xpress SARS-CoV-2/FLU/RSV testing.  Fact Sheet for Patients: BloggerCourse.com  Fact Sheet for Healthcare Providers: SeriousBroker.it  This test is not yet approved or cleared by the [place] FDA and has been authorized for detection and/or diagnosis of SARS-CoV-2 by FDA under an Emergency Use Authorization (EUA). This EUA will remain in effect (meaning this test can be used) for the duration of the COVID-19 declaration under Section 564(b)(1) of the Act, 21 U.S.C. section 360bbb-3(b)(1), unless the authorization is terminated or revoked.  Performed at [facility], [address], [place], [place] [address]   Blood Culture (routine x 2)     Status: None (Preliminary result)   Collection Time: 11/19/20 10:38 AM   Specimen: BLOOD RIGHT ARM  Result Value Ref Range Status   Specimen Description BLOOD RIGHT ARM  Final   Special Requests   Final    BOTTLES DRAWN AEROBIC AND ANAEROBIC Blood Culture adequate volume   Culture  Setup Time   Final    GRAM NEGATIVE RODS ANAEROBIC BOTTLE ONLY CRITICAL RESULT CALLED TO, READ BACK BY AND VERIFIED WITH: PHARMD J LEDFORD AT 0141 11/22/2020 BY L BENFIELD    Culture   Final    GRAM NEGATIVE RODS CULTURE REINCUBATED FOR BETTER GROWTH Performed at [facility], [address], [place], [place] [address]    Report Status PENDING  Incomplete  Blood Culture ID Panel (Reflexed)     Status: None   Collection Time: 11/19/20 10:38 AM  Result Value Ref Range Status   Enterococcus faecalis NOT DETECTED NOT DETECTED Final   Enterococcus Faecium NOT DETECTED NOT DETECTED Final   Listeria monocytogenes NOT DETECTED NOT DETECTED Final   Staphylococcus species NOT DETECTED NOT DETECTED Final   Staphylococcus aureus (BCID) NOT DETECTED NOT DETECTED Final   Staphylococcus epidermidis NOT DETECTED NOT DETECTED Final    Staphylococcus lugdunensis NOT DETECTED NOT DETECTED Final   Streptococcus species NOT DETECTED NOT DETECTED Final   Streptococcus agalactiae NOT DETECTED NOT DETECTED Final   Streptococcus pneumoniae NOT DETECTED NOT DETECTED Final   Streptococcus pyogenes NOT DETECTED NOT DETECTED Final   A.calcoaceticus-baumannii NOT DETECTED  NOT DETECTED Final   Bacteroides fragilis NOT DETECTED NOT DETECTED Final   Enterobacterales NOT DETECTED NOT DETECTED Final   Enterobacter cloacae complex NOT DETECTED NOT DETECTED Final   Escherichia coli NOT DETECTED NOT DETECTED Final   Klebsiella aerogenes NOT DETECTED NOT DETECTED Final   Klebsiella oxytoca NOT DETECTED NOT DETECTED Final   Klebsiella pneumoniae NOT DETECTED NOT DETECTED Final   Proteus species NOT DETECTED NOT DETECTED Final   Salmonella species NOT DETECTED NOT DETECTED Final   Serratia marcescens NOT DETECTED NOT DETECTED Final   Haemophilus influenzae NOT DETECTED NOT DETECTED Final   Neisseria meningitidis NOT DETECTED NOT DETECTED Final   Pseudomonas aeruginosa NOT DETECTED NOT DETECTED Final   Stenotrophomonas maltophilia NOT DETECTED NOT DETECTED Final   Candida albicans NOT DETECTED NOT DETECTED Final   Candida auris NOT DETECTED NOT DETECTED Final   Candida glabrata NOT DETECTED NOT DETECTED Final   Candida krusei NOT DETECTED NOT DETECTED Final   Candida parapsilosis NOT DETECTED NOT DETECTED Final   Candida tropicalis NOT DETECTED NOT DETECTED Final   Cryptococcus neoformans/gattii NOT DETECTED NOT DETECTED Final    Comment: Performed at [facility], [address] [place], [place] [address]  Resp Panel by RT-PCR (Flu A&B, Covid) Nasopharyngeal Swab     Status: None   Collection Time: 11/19/20  6:16 PM   Specimen: Nasopharyngeal Swab; Nasopharyngeal(NP) swabs in vial transport medium  Result Value Ref Range Status   SARS Coronavirus 2 by RT PCR NEGATIVE NEGATIVE Final    Comment: (NOTE) SARS-CoV-2 target nucleic  acids are NOT DETECTED.  The SARS-CoV-2 RNA is generally detectable in upper respiratory specimens during the acute phase of infection. The lowest concentration of SARS-CoV-2 viral copies this assay can detect is 138 copies/mL. A negative result does not preclude SARS-Cov-2 infection and should not be used as the sole basis for treatment or other patient management decisions. A negative result may occur with  improper specimen collection/handling, submission of specimen other than nasopharyngeal swab, presence of viral mutation(s) within the areas targeted by this assay, and inadequate number of viral copies(<138 copies/mL). A negative result must be combined with clinical observations, patient history, and epidemiological information. The expected result is Negative.  Fact Sheet for Patients:  BloggerCourse.comhttps://www.fda.gov/media/152166/download  Fact Sheet for Healthcare Providers:  SeriousBroker.ithttps://www.fda.gov/media/152162/download  This test is no t yet approved or cleared by the [place] FDA and  has been authorized for detection and/or diagnosis of SARS-CoV-2 by FDA under an Emergency Use Authorization (EUA). This EUA will remain  in effect (meaning this test can be used) for the duration of the COVID-19 declaration under Section 564(b)(1) of the Act, 21 U.S.C.section 360bbb-3(b)(1), unless the authorization is terminated  or revoked sooner.       Influenza A by PCR NEGATIVE NEGATIVE Final   Influenza B by PCR NEGATIVE NEGATIVE Final    Comment: (NOTE) The Xpert Xpress SARS-CoV-2/FLU/RSV plus assay is intended as an aid in the diagnosis of influenza from Nasopharyngeal swab specimens and should not be used as a sole basis for treatment. Nasal washings and aspirates are unacceptable for Xpert Xpress SARS-CoV-2/FLU/RSV testing.  Fact Sheet for Patients: BloggerCourse.comhttps://www.fda.gov/media/152166/download  Fact Sheet for Healthcare Providers: SeriousBroker.ithttps://www.fda.gov/media/152162/download  This  test is not yet approved or cleared by the [place] FDA and has been authorized for detection and/or diagnosis of SARS-CoV-2 by FDA under an Emergency Use Authorization (EUA). This EUA will remain in effect (meaning this test can be used) for the duration  of the COVID-19 declaration under Section 564(b)(1) of the Act, 21 U.S.C. section 360bbb-3(b)(1), unless the authorization is terminated or revoked.  Performed at [facility], [address] [place], [place] [address]   Blood Culture (routine x 2)     Status: None (Preliminary result)   Collection Time: 11/19/20 10:35 PM   Specimen: BLOOD  Result Value Ref Range Status   Specimen Description BLOOD RIGHT HAND  Final   Special Requests   Final    BOTTLES DRAWN AEROBIC AND ANAEROBIC Blood Culture results may not be optimal due to an inadequate volume of blood received in culture bottles   Culture   Final    NO GROWTH 3 DAYS Performed at [facility], [address] [place], [place] [address]    Report Status PENDING  Incomplete  Urine Culture     Status: Abnormal   Collection Time: 11/20/20 12:30 AM   Specimen: In/Out Cath Urine  Result Value Ref Range Status   Specimen Description IN/OUT CATH URINE  Final   Special Requests   Final    NONE Performed at [facility], [address] [place], [place] [address]    Culture MULTIPLE SPECIES PRESENT, SUGGEST RECOLLECTION (A)  Final   Report Status 11/21/2020 FINAL  Final  Surgical pcr screen     Status: Abnormal   Collection Time: 11/20/20 10:49 PM   Specimen: Nasal Mucosa; Nasal Swab  Result Value Ref Range Status   MRSA, PCR NEGATIVE NEGATIVE Final   Staphylococcus aureus POSITIVE (A) NEGATIVE Final    Comment: (NOTE) The Xpert SA Assay (FDA approved for NASAL specimens in patients 49 years of age and older), is one component of a comprehensive surveillance program. It is not intended to diagnose infection nor to guide or monitor treatment. Performed  at [facility], [address] [place], [place] [address]      Radiology Studies: No results found.  Scheduled Meds:  aspirin EC  81 mg Oral Daily   atorvastatin  40 mg Oral Daily   Chlorhexidine Gluconate Cloth  6 each Topical Q0600   enoxaparin (LOVENOX) injection  40 mg Subcutaneous Q24H   folic acid  1 mg Oral Daily   Gerhardt's butt cream   Topical TID   LORazepam  0-4 mg Intravenous Q12H   Or   LORazepam  0-4 mg Oral Q12H   multivitamin with minerals  1 tablet Oral Daily   mupirocin ointment  1 application Nasal BID   nicotine  21 mg Transdermal Daily   thiamine  100 mg Oral Daily   Or   thiamine  100 mg Intravenous Daily   Continuous Infusions:  lactated ringers 75 mL/hr at 11/21/20 0139   piperacillin-tazobactam (ZOSYN)  IV 3.375 g (11/23/20 0200)   vancomycin 1,250 mg (11/22/20 2102)     LOS: 4 days   Time spent:  Azucena Fallen, DO [facility]  If 7PM-7AM, please contact night-coverage [email or web address]  11/23/2020, 7:39 AM

## 2020-11-23 NOTE — Progress Notes (Signed)
  Progress Note    11/23/2020 9:22 AM 2 Days Post-Op  Subjective:  More alert today. Pain well controlled   Vitals:   11/22/20 2318 11/23/20 0324  BP: 137/86 117/79  Pulse: 91 (!) 107  Resp: 16 18  Temp: 98.3 F (36.8 C) 98.7 F (37.1 C)  SpO2: 99% 100%   Tmax 100.5 Physical Exam: General appearance: Awake, alert in no apparent distress Cardiac: Heart rate and rhythm are regular Respirations: Nonlabored Extremities: Moderate edema without drainage. Amputation site incision is well approximated without bleeding or hematoma.  Anterior and posterior flaps are warm and well-perfused       CBC    Component Value Date/Time   WBC 11.0 (H) 11/23/2020 0118   RBC 3.63 (L) 11/23/2020 0118   HGB 10.2 (L) 11/23/2020 0118   HCT 30.0 (L) 11/23/2020 0118   HCT 36.4 (L) 05/20/2018 1923   PLT 177 11/23/2020 0118   MCV 82.6 11/23/2020 0118   MCH 28.1 11/23/2020 0118   MCHC 34.0 11/23/2020 0118   RDW 15.0 11/23/2020 0118   LYMPHSABS 1.6 11/19/2020 1033   MONOABS 0.6 11/19/2020 1033   EOSABS 0.2 11/19/2020 1033   BASOSABS 0.1 11/19/2020 1033    BMET    Component Value Date/Time   NA 132 (L) 11/23/2020 0118   K 2.8 (L) 11/23/2020 0118   CL 98 11/23/2020 0118   CO2 27 11/23/2020 0118   GLUCOSE 107 (H) 11/23/2020 0118   BUN <5 (L) 11/23/2020 0118   CREATININE 0.53 (L) 11/23/2020 0118   CALCIUM 8.2 (L) 11/23/2020 0118   GFRNONAA >60 11/23/2020 0118   GFRAA >60 11/12/2019 0359     Intake/Output Summary (Last 24 hours) at 11/23/2020 0922 Last data filed at 11/23/2020 0342 Gross per 24 hour  Intake 240 ml  Output 1750 ml  Net -1510 ml    HOSPITAL MEDICATIONS Scheduled Meds:  aspirin EC  81 mg Oral Daily   atorvastatin  40 mg Oral Daily   Chlorhexidine Gluconate Cloth  6 each Topical Q0600   enoxaparin (LOVENOX) injection  40 mg Subcutaneous Q24H   folic acid  1 mg Oral Daily   Gerhardt's butt cream   Topical TID   LORazepam  0-4 mg Intravenous Q12H   Or    LORazepam  0-4 mg Oral Q12H   multivitamin with minerals  1 tablet Oral Daily   mupirocin ointment  1 application Nasal BID   nicotine  21 mg Transdermal Daily   thiamine  100 mg Oral Daily   Or   thiamine  100 mg Intravenous Daily   Continuous Infusions:  lactated ringers 75 mL/hr at 11/21/20 0139   piperacillin-tazobactam (ZOSYN)  IV 3.375 g (11/23/20 0200)   vancomycin 1,250 mg (11/22/20 2102)   PRN Meds:.acetaminophen **OR** acetaminophen, albuterol, alum & mag hydroxide-simeth, diphenhydrAMINE-zinc acetate, ibuprofen, metoprolol tartrate, morphine injection, ondansetron **OR** ondansetron (ZOFRAN) IV, oxyCODONE-acetaminophen, senna-docusate, zolpidem  Assessment and Plan: POD 2 left AKA conversion from infected BKA. Moderate edema. VSS. Low grade temp yesterday evening, afebrile overnight. Hgb 10.2 Continue antibiotics. Monitor staple line.   -DVT prophylaxis:  Lovenox   Wendi Maya, PA-C [facility] [phone] 11/23/2020  9:22 AM

## 2020-11-23 NOTE — Progress Notes (Signed)
    Subjective  - POD #2 status post left above-knee amputation  No acute events   Physical Exam:  Patient sleeping this morning        Assessment/Plan:  POD #2  I elected not to awake the patient this morning.  He will need his dressing changed later today or tomorrow  [place]s Muhammadali Ries 11/23/2020 6:53 AM --  Vitals:   11/22/20 2318 11/23/20 0324  BP: 137/86 117/79  Pulse: 91 (!) 107  Resp: 16 18  Temp: 98.3 F (36.8 C) 98.7 F (37.1 C)  SpO2: 99% 100%    Intake/Output Summary (Last 24 hours) at 11/23/2020 0653 Last data filed at 11/23/2020 0342 Gross per 24 hour  Intake 300 ml  Output 2125 ml  Net -1825 ml     Laboratory CBC    Component Value Date/Time   WBC 11.0 (H) 11/23/2020 0118   HGB 10.2 (L) 11/23/2020 0118   HCT 30.0 (L) 11/23/2020 0118   HCT 36.4 (L) 05/20/2018 1923   PLT 177 11/23/2020 0118    BMET    Component Value Date/Time   NA 132 (L) 11/23/2020 0118   K 2.8 (L) 11/23/2020 0118   CL 98 11/23/2020 0118   CO2 27 11/23/2020 0118   GLUCOSE 107 (H) 11/23/2020 0118   BUN <5 (L) 11/23/2020 0118   CREATININE 0.53 (L) 11/23/2020 0118   CALCIUM 8.2 (L) 11/23/2020 0118   GFRNONAA >60 11/23/2020 0118   GFRAA >60 11/12/2019 0359    COAG Lab Results  Component Value Date   INR 1.0 11/19/2020   INR 1.1 08/05/2020   INR 1.1 11/07/2019   No results found for: PTT  Antibiotics Anti-infectives (From admission, onward)    Start     Dose/Rate Route Frequency Ordered Stop   11/20/20 0800  vancomycin (VANCOCIN) IVPB 1000 mg/200 mL premix  Status:  Discontinued        1,000 mg 200 mL/hr over 60 Minutes Intravenous Every 12 hours 11/19/20 2043 11/20/20 0746   11/20/20 0800  vancomycin (VANCOREADY) IVPB 1250 mg/250 mL        1,250 mg 166.7 mL/hr over 90 Minutes Intravenous Every 12 hours 11/20/20 0746     11/19/20 2230  piperacillin-tazobactam (ZOSYN) IVPB 3.375 g        3.375 g 12.5 mL/hr over 240 Minutes Intravenous Every 8 hours 11/19/20  2226     11/19/20 1815  vancomycin (VANCOREADY) IVPB 1500 mg/300 mL        1,500 mg 150 mL/hr over 120 Minutes Intravenous  Once 11/19/20 1804 11/19/20 2225   11/19/20 1800  piperacillin-tazobactam (ZOSYN) IVPB 3.375 g        3.375 g 100 mL/hr over 30 Minutes Intravenous  Once 11/19/20 1746 11/19/20 1856        V. Charlena Cross, M.D., [facility] [place] Office: [phone] Pager:  [phone]

## 2020-11-24 DIAGNOSIS — R6520 Severe sepsis without septic shock: Secondary | ICD-10-CM

## 2020-11-24 DIAGNOSIS — A498 Other bacterial infections of unspecified site: Secondary | ICD-10-CM

## 2020-11-24 DIAGNOSIS — A419 Sepsis, unspecified organism: Secondary | ICD-10-CM

## 2020-11-24 DIAGNOSIS — R7881 Bacteremia: Secondary | ICD-10-CM

## 2020-11-24 DIAGNOSIS — Z89512 Acquired absence of left leg below knee: Secondary | ICD-10-CM

## 2020-11-24 DIAGNOSIS — L03116 Cellulitis of left lower limb: Secondary | ICD-10-CM

## 2020-11-24 HISTORY — DX: Sepsis, unspecified organism: A41.9

## 2020-11-24 LAB — CBC
HCT: 29.7 % — ABNORMAL LOW (ref 39.0–52.0)
HCT: 32.1 % — ABNORMAL LOW (ref 39.0–52.0)
Hemoglobin: 9.9 g/dL — ABNORMAL LOW (ref 13.0–17.0)
Hemoglobin: 10.5 g/dL — ABNORMAL LOW (ref 13.0–17.0)
MCH: 28 pg (ref 26.0–34.0)
MCH: 27.9 pg (ref 26.0–34.0)
MCHC: 33.3 g/dL (ref 30.0–36.0)
MCHC: 32.7 g/dL (ref 30.0–36.0)
MCV: 84.1 fL (ref 80.0–100.0)
MCV: 85.4 fL (ref 80.0–100.0)
Platelets: 188 10*3/uL (ref 150–400)
Platelets: 240 10*3/uL (ref 150–400)
RBC: 3.53 MIL/uL — ABNORMAL LOW (ref 4.22–5.81)
RBC: 3.76 MIL/uL — ABNORMAL LOW (ref 4.22–5.81)
RDW: 15.1 % (ref 11.5–15.5)
RDW: 14.9 % (ref 11.5–15.5)
WBC: 10 10*3/uL (ref 4.0–10.5)
WBC: 10.2 10*3/uL (ref 4.0–10.5)
nRBC: 0 % (ref 0.0–0.2)
nRBC: 0 % (ref 0.0–0.2)

## 2020-11-24 LAB — CULTURE, BLOOD (ROUTINE X 2): Special Requests: ADEQUATE

## 2020-11-24 LAB — BASIC METABOLIC PANEL
Anion gap: 6 (ref 5–15)
BUN: <5 mg/dL — ABNORMAL LOW (ref 6–20)
CO2: 27 mmol/L (ref 22–32)
Calcium: 8.1 mg/dL — ABNORMAL LOW (ref 8.9–10.3)
Chloride: 101 mmol/L (ref 98–111)
Creatinine, Ser: 0.53 mg/dL — ABNORMAL LOW (ref 0.61–1.24)
GFR, Estimated: >60 mL/min (ref >60)
Glucose, Bld: 132 mg/dL — ABNORMAL HIGH (ref 70–99)
Potassium: 3.4 mmol/L — ABNORMAL LOW (ref 3.5–5.1)
Sodium: 134 mmol/L — ABNORMAL LOW (ref 135–145)

## 2020-11-24 LAB — MAGNESIUM: Magnesium: 1.7 mg/dL (ref 1.7–2.4)

## 2020-11-24 LAB — PHOSPHORUS: Phosphorus: 3.4 mg/dL (ref 2.5–4.6)

## 2020-11-24 MED ORDER — LORAZEPAM 2 MG/ML IJ SOLN
1.0000 mg | INTRAMUSCULAR | Status: DC | PRN
  Administered 2020-11-24 – 2020-11-26 (×3): 2 mg via INTRAVENOUS
  Filled 2020-11-24 (×3): qty 1

## 2020-11-24 MED ORDER — SODIUM CHLORIDE 0.9 % IV SOLN
3.0000 g | Freq: Four times a day (QID) | INTRAVENOUS | Status: DC
  Administered 2020-11-24 – 2020-11-26 (×8): 3 g via INTRAVENOUS
  Filled 2020-11-24 (×11): qty 8

## 2020-11-24 MED ORDER — LORAZEPAM 1 MG PO TABS: 1.0000 mg | ORAL_TABLET | ORAL | Status: DC | PRN

## 2020-11-24 NOTE — TOC Progression Note (Signed)
Transition of Care [facility]) - Progression Note    Patient Details  Name: Jake Nguyen MRN: [id number] Date of Birth: 1965-08-22  Transition of Care [facility]) CM/SW [place], RN Phone Number: 11/24/2020, 11:41 AM  Clinical Narrative:    Case management met with the patient at the bedside regarding transitions of care.  The patient states that he was living on the streets prior to admission.  The patient is waiting for PT/OT evaluation at this time after surgery for Left AKA on 11/21/2020 by vascular surgery.  The patient had recent COVID vaccine on 08/11/2020.    I spoke with the patient and he is agreeable to SNF admission at this time.  CM called and spoke with Marcelene Butte, RNCM at [facility] [phone] and she is aware that the patient received his surgery on 11/21/2020 and is waiting for PT/OT evaluation and is waiting for possible SNF placement if a bed offer is found.  Madilyn Fireman, MSW reached out to [place], [place] at [facility] and forwarded [facility] to the facility to review for possible placement.  CM and MSW with DTP Team will continue to follow the patient for SNF placement.   Expected Discharge Plan: Skilled Nursing Facility Barriers to Discharge: Homeless with medical needs, Continued Medical Work up, Unsafe home situation  Expected Discharge Plan and Services Expected Discharge Plan: [place] In-house Referral: Clinical Social Work Discharge Planning Services: CM Consult Post Acute Care Choice: [place] arrangements for the past 2 months: Homeless                                       Social Determinants of Health (SDOH) Interventions    Readmission Risk Interventions Readmission Risk Prevention Plan 08/10/2020  Transportation Screening Complete  PCP or Specialist Appt within 5-7 Days Complete  Home Care Screening Complete  Medication Review (RN CM) Complete  Some recent data might be  hidden

## 2020-11-24 NOTE — NC FL2 (Signed)
[place] MEDICAID FL2 LEVEL OF CARE SCREENING TOOL     IDENTIFICATION  Patient Name: Jake Nguyen Birthdate: 01/18/1966 Sex: male Admission Date (Current Location): 11/19/2020  [facility] and [place] Number:  Producer, television/film/video and Address:  [facility], [address], [place], [place] [address]      Provider Number: [id number]  Attending Physician Name and Address:  Azucena Fallen, MD  Relative Name and Phone Number:       Current Level of Care: Hospital Recommended Level of Care: Skilled Nursing Facility Prior Approval Number:    Date Approved/Denied:   PASRR Number: [id number]  Discharge Plan: SNF    Current Diagnoses: Patient Active Problem List   Diagnosis Date Noted   Cellulitis of left leg 11/19/2020   Alcohol withdrawal syndrome (HCC) 11/19/2020   Hypokalemia 11/19/2020   Acute metabolic encephalopathy 11/14/2020   Sepsis (HCC) 11/14/2020   S/P BKA (below knee amputation) unilateral, left (HCC)    Neuropathic pain, leg, left    Gangrene (HCC) 07/31/2020   Gangrene of foot (HCC) 07/14/2020   Ataxia    Alcohol abuse with alcohol-induced mood disorder (HCC) 11/10/2019   Alcoholism /alcohol abuse    Alcohol withdrawal syndrome, with delirium (HCC) 11/06/2019   Pressure injury of skin 05/25/2018   Rhabdomyolysis 05/20/2018   Hyponatremia 05/20/2018   Elevated troponin 05/20/2018   Moderate dehydration 05/20/2018   Alcohol use 05/20/2018   Influenza A 05/20/2018   Transaminitis 05/20/2018    Orientation RESPIRATION BLADDER Height & Weight     Self, Time, Situation, Place  Normal Continent Weight: 165 lb (74.8 kg) Height:  5\' 10"  (177.8 cm)  BEHAVIORAL SYMPTOMS/MOOD NEUROLOGICAL BOWEL NUTRITION STATUS      Continent Diet (Normal)  AMBULATORY STATUS COMMUNICATION OF NEEDS Skin   Extensive Assist Verbally Normal, Surgical wounds                       Personal Care Assistance Level of Assistance  Bathing,  Dressing, Feeding Bathing Assistance: Limited assistance Feeding assistance: Independent Dressing Assistance: Limited assistance     Functional Limitations Info  Sight, Speech, Hearing Sight Info: Adequate Hearing Info: Adequate Speech Info: Adequate    SPECIAL CARE FACTORS FREQUENCY  PT (By licensed PT), OT (By licensed OT)     PT Frequency: 5x weekly OT Frequency: 5x weekly            Contractures Contractures Info: Not present    Additional Factors Info  Code Status, Allergies Code Status Info: Full Allergies Info: Pork-derived products           Current Medications (11/24/2020):  This is the current hospital active medication list Current Facility-Administered Medications  Medication Dose Route Frequency Provider Last Rate Last Admin   acetaminophen (TYLENOL) tablet 650 mg  650 mg Oral Q6H PRN 11/26/2020, PA-C   650 mg at 11/20/20 01/20/21   Or   acetaminophen (TYLENOL) suppository 650 mg  650 mg Rectal Q6H PRN 8099, PA-C       albuterol (PROVENTIL) (2.5 MG/3ML) 0.083% nebulizer solution 2.5 mg  2.5 mg Nebulization Q4H PRN Setzer, Milinda Antis, PA-C       alum & mag hydroxide-simeth (MAALOX/MYLANTA) 200-200-20 MG/5ML suspension 30 mL  30 mL Oral Q6H PRN 10-18-2000, PA-C       aspirin EC tablet 81 mg  81 mg Oral Daily Milinda Antis, PA-C   81 mg at 11/24/20 1012  atorvastatin (LIPITOR) tablet 40 mg  40 mg Oral Daily Milinda Antis, PA-C   40 mg at 11/24/20 1013   Chlorhexidine Gluconate Cloth 2 % PADS 6 each  6 each Topical Q0600 Milinda Antis, PA-C   6 each at 11/24/20 8242   diphenhydrAMINE-zinc acetate (BENADRYL) 2-0.1 % cream   Topical BID PRN Milinda Antis, PA-C   Given at 11/20/20 2156   enoxaparin (LOVENOX) injection 40 mg  40 mg Subcutaneous Q24H Milinda Antis, PA-C   40 mg at 11/24/20 1013   folic acid (FOLVITE) tablet 1 mg  1 mg Oral Daily Milinda Antis, PA-C   1 mg at 11/24/20 1012   Gerhardt's butt cream   Topical TID  Milinda Antis, PA-C   Given at 11/24/20 1016   ibuprofen (ADVIL) tablet 600 mg  600 mg Oral Q8H PRN Milinda Antis, PA-C       lactated ringers infusion   Intravenous Continuous Milinda Antis, PA-C 75 mL/hr at 11/24/20 0106 New Bag at 11/24/20 0106   metoprolol tartrate (LOPRESSOR) injection 2.5 mg  2.5 mg Intravenous Q6H PRN Dow Adolph N, DO   2.5 mg at 11/22/20 0355   morphine 2 MG/ML injection 2 mg  2 mg Intravenous Q4H PRN Milinda Antis, PA-C   2 mg at 11/23/20 1230   multivitamin with minerals tablet 1 tablet  1 tablet Oral Daily Milinda Antis, PA-C   1 tablet at 11/24/20 1012   mupirocin ointment (BACTROBAN) 2 % 1 application  1 application Nasal BID Milinda Antis, PA-C   1 application at 11/24/20 1016   nicotine (NICODERM CQ - dosed in mg/24 hours) patch 21 mg  21 mg Transdermal Daily Milinda Antis, PA-C   21 mg at 11/23/20 1842   ondansetron (ZOFRAN) tablet 4 mg  4 mg Oral Q6H PRN Milinda Antis, PA-C       Or   ondansetron [facility]) injection 4 mg  4 mg Intravenous Q6H PRN Setzer, Lynnell Jude, PA-C       oxyCODONE-acetaminophen (PERCOCET) 7.5-325 MG per tablet 1 tablet  1 tablet Oral Q4H PRN Milinda Antis, PA-C   1 tablet at 11/24/20 0523   piperacillin-tazobactam (ZOSYN) IVPB 3.375 g  3.375 g Intravenous Q8H Milinda Antis, PA-C 12.5 mL/hr at 11/24/20 1020 3.375 g at 11/24/20 1020   senna-docusate (Senokot-S) tablet 1 tablet  1 tablet Oral QHS PRN Milinda Antis, PA-C       thiamine tablet 100 mg  100 mg Oral Daily Milinda Antis, PA-C   100 mg at 11/24/20 1013   Or   thiamine (B-1) injection 100 mg  100 mg Intravenous Daily Setzer, Lynnell Jude, PA-C       zolpidem (AMBIEN) tablet 5 mg  5 mg Oral QHS PRN Milinda Antis, PA-C   5 mg at 11/23/20 2110     Discharge Medications: Please see discharge summary for a list of discharge medications.  Relevant Imaging Results:  Relevant Lab Results:   Additional Information SSN: [id number]  Inis Sizer,  LCSW

## 2020-11-24 NOTE — Progress Notes (Signed)
Mobility Specialist: Progress Note   11/24/20 1215  Mobility  Activity Ambulated in room  Level of Assistance Contact guard assist, steadying assist  Assistive Device Front wheel walker  Distance Ambulated (ft) 36 ft  Mobility Ambulated with assistance in room  Mobility Response Tolerated fair  Mobility performed by Mobility specialist  $Mobility charge 1 Mobility    Pre-Mobility: 114 HR, 106/86 BP, 100% SpO2 During Mobility: 120s HR Post-Mobility: 129-135 HR, 119/77 BP  Pt independent with bed mobility. Pt did c/o dizziness upon sitting EOB but progressed better throughout session. Pt required contact guard to stand as well as during ambulation d/t unsteadiness. Pt did required verbal cues to stay on task during mobility as well. Pt back to bed after walk with call bell and phone at his side. Bed alarm is on. Pt requesting pain medication, RN notified.     [facility] Jozelyn [place] Mobility Specialist Mobility Specialist Phone: [phone]

## 2020-11-24 NOTE — Progress Notes (Signed)
PROGRESS NOTE    Jake Nguyen  RFF:[id number] DOB: 1965-10-04 DOA: 11/19/2020 PCP: Patient, No Pcp Per (Inactive)   Brief Narrative:  Jake Nguyen is a 55 y.o. male well known to the hospital system with medical history significant for PAD, HLD, ETOH abuse, s/p left BKA in April of 2022 which he never followed up for and still has retained staples. His brother filed IVC paperwork on Jake Nguyen this am because he has not been taking care of himself. Brother stated to the ER provider that he has seen pus and maggots in the wound. Jake Nguyen admits that the wound has drained white purulent discharge.  He reports has been swollen and very red over the last few days. Patient was seen earlier this week for the same problem and was admitted but he left AMA a few hours later.  He has not been compliant with his medications or antibiotics in the past.   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 - secondary to noncompliance Concurrent fusobacterium nucleatum - Based on imaging - Vascular consulted - he is well known to their practice -surgery later today with AKA - Patient has been IVC'd by his brother prior to being sent to ER - on 11/18/20 (expired 11/25/20) - ID consulted given atypical bacteria with fusobacterium  Alcohol abuse with active withdrawal  - Continue CIWA protocol - continues to improve daily - Tolerating detox well - Family reports 120-160 ounces of beer/24h - last drink within 12 hours of admission   Hypokalemia - Follow repeat labs - replete as appropriate  Chronic comorbid diseases - Peripheral arterial disease - continue ASA 81, Statin - HLD - continue statin  DVT prophylaxis: Lovenox Code Status: Full Family Communication: Offered to call brother/sister - patient  refused offer "I will let them know myself"  Status is: Inpt  Dispo: The patient is from: Home              Anticipated d/c is to: TBD              Anticipated d/c date is: >72h              Patient currently NOT medically stable for discharge  Consultants:  Vascular Sx  Procedures:  Left AKA 11/21/2020  Antimicrobials:  Vanc/Zosyn 8/11 - ongoing   Subjective: No acute issues/events overnight - Patient much more awake/alert today - tolerating PO well this am - denies fever, chills, headache, nausea, or vomiting.  Objective: Vitals:   11/23/20 2300 11/24/20 0251 11/24/20 0300 11/24/20 0400  BP: 110/78 107/72    Pulse: 96 95    Resp: 15 18 20 20   Temp: 98.8 F (37.1 C) 98.8 F (37.1 C)    TempSrc: Oral Oral    SpO2: 100% 100% 97%   Weight:      Height:        Intake/Output Summary (Last 24 hours) at 11/24/2020 0724 Last data filed at 11/24/2020 0700 Gross per 24 hour  Intake 700 ml  Output 3950 ml  Net -3250 ml    Filed Weights   11/20/20 0218  Weight: 74.8 kg    Examination:  General:  Pleasantly resting in bed, No acute distress. HEENT:  Normocephalic atraumatic.  Sclerae nonicteric, noninjected.  Extraocular movements intact bilaterally. Neck:  Without mass or deformity.  Trachea is midline. Lungs:  Clear to auscultate bilaterally without rhonchi, wheeze, or rales. Heart:  Regular rate and rhythm.  Without murmurs, rubs, or gallops. Abdomen:  Soft, nontender, nondistended.  Without guarding or rebound. Extremities: Without cyanosis, clubbing, edema, left lower AKA bandage clean dry intact Vascular:  Dorsalis pedis and posterior tibial pulses palpable bilaterally. Skin:  Warm and dry, no erythema, no ulcerations.  Data Reviewed: I have personally reviewed following labs and imaging studies  CBC: Recent Labs  Lab 11/19/20 1033 11/20/20 0500 11/21/20 0227 11/22/20 0050 11/23/20 0118 11/24/20 0108  WBC 5.7 7.9 5.7 9.5 11.0* 10.0  NEUTROABS 3.3  --    --   --   --   --   HGB 13.1 11.8* 11.5* 10.9* 10.2* 9.9*  HCT 40.9 35.8* 34.7* 32.6* 30.0* 29.7*  MCV 84.9 84.2 83.4 83.4 82.6 84.1  PLT 188 155 147* 154 177 188    Basic Metabolic Panel: Recent Labs  Lab 11/19/20 2241 11/20/20 0500 11/21/20 0227 11/22/20 0050 11/23/20 0118 11/24/20 0108  NA  --  133* 131* 131* 132* 134*  K  --  3.5 3.0* 2.8* 2.8* 3.4*  CL  --  93* 97* 96* 98 101  CO2  --  27 25 27 27 27   GLUCOSE  --  92 102* 114* 107* 132*  BUN  --  <5* <5* <5* <5* <5*  CREATININE  --  0.60* 0.52* 0.52* 0.53* 0.53*  CALCIUM  --  8.6* 8.3* 8.0* 8.2* 8.1*  MG 1.8  --   --   --   --   --   PHOS 3.3  --   --   --   --   --     GFR: Estimated Creatinine Clearance: 110.3 mL/min (A) (by C-G formula based on SCr of 0.53 mg/dL (L)).  Liver Function Tests: Recent Labs  Lab 11/19/20 1033  AST 37  ALT 23  ALKPHOS 67  BILITOT 0.3  PROT 8.4*  ALBUMIN 3.0*    No results for input(s): LIPASE, AMYLASE in the last 168 hours. No results for input(s): AMMONIA in the last 168 hours.  Coagulation Profile: Recent Labs  Lab 11/19/20 1033  INR 1.0    Cardiac Enzymes: No results for input(s): CKTOTAL, CKMB, CKMBINDEX, TROPONINI in the last 168 hours. BNP (last 3 results) No results for input(s): PROBNP in the last 8760 hours. HbA1C: No results for input(s): HGBA1C in the last 72 hours. CBG: No results for input(s): GLUCAP in the last 168 hours. Lipid Profile: No results for input(s): CHOL, HDL, LDLCALC, TRIG, CHOLHDL, LDLDIRECT in the last 72 hours. Thyroid Function Tests: No results for input(s): TSH, T4TOTAL, FREET4, T3FREE, THYROIDAB in the last 72 hours. Anemia Panel: No results for input(s): VITAMINB12, FOLATE, FERRITIN, TIBC, IRON, RETICCTPCT in the last 72 hours. Sepsis Labs: Recent Labs  Lab 11/19/20 1055 11/19/20 1754  LATICACIDVEN 2.1* 3.5*    Recent Results (from the past 240 hour(s))  Resp Panel by RT-PCR (Flu A&B, Covid) Nasopharyngeal Swab     Status:  None   Collection Time: 11/14/20 10:40 AM   Specimen: Nasopharyngeal Swab; Nasopharyngeal(NP) swabs in vial transport medium  Result Value Ref Range Status   SARS Coronavirus 2 by RT PCR NEGATIVE NEGATIVE Final    Comment: (NOTE) SARS-CoV-2 target nucleic acids are NOT DETECTED.  The SARS-CoV-2 RNA is generally detectable in upper respiratory specimens during the acute phase of infection. The lowest concentration of SARS-CoV-2 viral copies  this assay can detect is 138 copies/mL. A negative result does not preclude SARS-Cov-2 infection and should not be used as the sole basis for treatment or other patient management decisions. A negative result may occur with  improper specimen collection/handling, submission of specimen other than nasopharyngeal swab, presence of viral mutation(s) within the areas targeted by this assay, and inadequate number of viral copies(<138 copies/mL). A negative result must be combined with clinical observations, patient history, and epidemiological information. The expected result is Negative.  Fact Sheet for Patients:  BloggerCourse.com  Fact Sheet for Healthcare Providers:  SeriousBroker.it  This test is no t yet approved or cleared by the [place] FDA and  has been authorized for detection and/or diagnosis of SARS-CoV-2 by FDA under an Emergency Use Authorization (EUA). This EUA will remain  in effect (meaning this test can be used) for the duration of the COVID-19 declaration under Section 564(b)(1) of the Act, 21 U.S.C.section 360bbb-3(b)(1), unless the authorization is terminated  or revoked sooner.       Influenza A by PCR NEGATIVE NEGATIVE Final   Influenza B by PCR NEGATIVE NEGATIVE Final    Comment: (NOTE) The Xpert Xpress SARS-CoV-2/FLU/RSV plus assay is intended as an aid in the diagnosis of influenza from Nasopharyngeal swab specimens and should not be used as a sole basis for  treatment. Nasal washings and aspirates are unacceptable for Xpert Xpress SARS-CoV-2/FLU/RSV testing.  Fact Sheet for Patients: BloggerCourse.com  Fact Sheet for Healthcare Providers: SeriousBroker.it  This test is not yet approved or cleared by the [place] FDA and has been authorized for detection and/or diagnosis of SARS-CoV-2 by FDA under an Emergency Use Authorization (EUA). This EUA will remain in effect (meaning this test can be used) for the duration of the COVID-19 declaration under Section 564(b)(1) of the Act, 21 U.S.C. section 360bbb-3(b)(1), unless the authorization is terminated or revoked.  Performed at [facility], [address], [place], [place] [address]   Blood Culture (routine x 2)     Status: None (Preliminary result)   Collection Time: 11/19/20 10:38 AM   Specimen: BLOOD RIGHT ARM  Result Value Ref Range Status   Specimen Description BLOOD RIGHT ARM  Final   Special Requests   Final    BOTTLES DRAWN AEROBIC AND ANAEROBIC Blood Culture adequate volume   Culture  Setup Time   Final    GRAM NEGATIVE RODS ANAEROBIC BOTTLE ONLY CRITICAL RESULT CALLED TO, READ BACK BY AND VERIFIED WITH: PHARMD J LEDFORD AT 0141 11/22/2020 BY L BENFIELD    Culture   Final    GRAM NEGATIVE RODS CULTURE REINCUBATED FOR BETTER GROWTH Performed at [facility], [address], [place], [place] [address]    Report Status PENDING  Incomplete  Blood Culture ID Panel (Reflexed)     Status: None   Collection Time: 11/19/20 10:38 AM  Result Value Ref Range Status   Enterococcus faecalis NOT DETECTED NOT DETECTED Final   Enterococcus Faecium NOT DETECTED NOT DETECTED Final   Listeria monocytogenes NOT DETECTED NOT DETECTED Final   Staphylococcus species NOT DETECTED NOT DETECTED Final   Staphylococcus aureus (BCID) NOT DETECTED NOT DETECTED Final   Staphylococcus epidermidis NOT DETECTED NOT DETECTED  Final   Staphylococcus lugdunensis NOT DETECTED NOT DETECTED Final   Streptococcus species NOT DETECTED NOT DETECTED Final   Streptococcus agalactiae NOT DETECTED NOT DETECTED Final   Streptococcus pneumoniae NOT DETECTED NOT DETECTED Final   Streptococcus pyogenes NOT DETECTED NOT DETECTED  Final   A.calcoaceticus-baumannii NOT DETECTED NOT DETECTED Final   Bacteroides fragilis NOT DETECTED NOT DETECTED Final   Enterobacterales NOT DETECTED NOT DETECTED Final   Enterobacter cloacae complex NOT DETECTED NOT DETECTED Final   Escherichia coli NOT DETECTED NOT DETECTED Final   Klebsiella aerogenes NOT DETECTED NOT DETECTED Final   Klebsiella oxytoca NOT DETECTED NOT DETECTED Final   Klebsiella pneumoniae NOT DETECTED NOT DETECTED Final   Proteus species NOT DETECTED NOT DETECTED Final   Salmonella species NOT DETECTED NOT DETECTED Final   Serratia marcescens NOT DETECTED NOT DETECTED Final   Haemophilus influenzae NOT DETECTED NOT DETECTED Final   Neisseria meningitidis NOT DETECTED NOT DETECTED Final   Pseudomonas aeruginosa NOT DETECTED NOT DETECTED Final   Stenotrophomonas maltophilia NOT DETECTED NOT DETECTED Final   Candida albicans NOT DETECTED NOT DETECTED Final   Candida auris NOT DETECTED NOT DETECTED Final   Candida glabrata NOT DETECTED NOT DETECTED Final   Candida krusei NOT DETECTED NOT DETECTED Final   Candida parapsilosis NOT DETECTED NOT DETECTED Final   Candida tropicalis NOT DETECTED NOT DETECTED Final   Cryptococcus neoformans/gattii NOT DETECTED NOT DETECTED Final    Comment: Performed at [facility], [address] [place], [place] [address]  Resp Panel by RT-PCR (Flu A&B, Covid) Nasopharyngeal Swab     Status: None   Collection Time: 11/19/20  6:16 PM   Specimen: Nasopharyngeal Swab; Nasopharyngeal(NP) swabs in vial transport medium  Result Value Ref Range Status   SARS Coronavirus 2 by RT PCR NEGATIVE NEGATIVE Final    Comment: (NOTE) SARS-CoV-2 target  nucleic acids are NOT DETECTED.  The SARS-CoV-2 RNA is generally detectable in upper respiratory specimens during the acute phase of infection. The lowest concentration of SARS-CoV-2 viral copies this assay can detect is 138 copies/mL. A negative result does not preclude SARS-Cov-2 infection and should not be used as the sole basis for treatment or other patient management decisions. A negative result may occur with  improper specimen collection/handling, submission of specimen other than nasopharyngeal swab, presence of viral mutation(s) within the areas targeted by this assay, and inadequate number of viral copies(<138 copies/mL). A negative result must be combined with clinical observations, patient history, and epidemiological information. The expected result is Negative.  Fact Sheet for Patients:  BloggerCourse.comhttps://www.fda.gov/media/152166/download  Fact Sheet for Healthcare Providers:  SeriousBroker.ithttps://www.fda.gov/media/152162/download  This test is no t yet approved or cleared by the [place] FDA and  has been authorized for detection and/or diagnosis of SARS-CoV-2 by FDA under an Emergency Use Authorization (EUA). This EUA will remain  in effect (meaning this test can be used) for the duration of the COVID-19 declaration under Section 564(b)(1) of the Act, 21 U.S.C.section 360bbb-3(b)(1), unless the authorization is terminated  or revoked sooner.       Influenza A by PCR NEGATIVE NEGATIVE Final   Influenza B by PCR NEGATIVE NEGATIVE Final    Comment: (NOTE) The Xpert Xpress SARS-CoV-2/FLU/RSV plus assay is intended as an aid in the diagnosis of influenza from Nasopharyngeal swab specimens and should not be used as a sole basis for treatment. Nasal washings and aspirates are unacceptable for Xpert Xpress SARS-CoV-2/FLU/RSV testing.  Fact Sheet for Patients: BloggerCourse.comhttps://www.fda.gov/media/152166/download  Fact Sheet for Healthcare  Providers: SeriousBroker.ithttps://www.fda.gov/media/152162/download  This test is not yet approved or cleared by the [place] FDA and has been authorized for detection and/or diagnosis of SARS-CoV-2 by FDA under an Emergency Use Authorization (EUA). This EUA will remain in effect (meaning this test  can be used) for the duration of the COVID-19 declaration under Section 564(b)(1) of the Act, 21 U.S.C. section 360bbb-3(b)(1), unless the authorization is terminated or revoked.  Performed at [facility], [address], [place], [place] [address]   Blood Culture (routine x 2)     Status: None (Preliminary result)   Collection Time: 11/19/20 10:35 PM   Specimen: BLOOD  Result Value Ref Range Status   Specimen Description BLOOD RIGHT HAND  Final   Special Requests   Final    BOTTLES DRAWN AEROBIC AND ANAEROBIC Blood Culture results may not be optimal due to an inadequate volume of blood received in culture bottles   Culture   Final    NO GROWTH 3 DAYS Performed at [facility], [address], [place], [place] [address]    Report Status PENDING  Incomplete  Urine Culture     Status: Abnormal   Collection Time: 11/20/20 12:30 AM   Specimen: In/Out Cath Urine  Result Value Ref Range Status   Specimen Description IN/OUT CATH URINE  Final   Special Requests   Final    NONE Performed at [facility], [address] [place], [place] [address]    Culture MULTIPLE SPECIES PRESENT, SUGGEST RECOLLECTION (A)  Final   Report Status 11/21/2020 FINAL  Final  Surgical pcr screen     Status: Abnormal   Collection Time: 11/20/20 10:49 PM   Specimen: Nasal Mucosa; Nasal Swab  Result Value Ref Range Status   MRSA, PCR NEGATIVE NEGATIVE Final   Staphylococcus aureus POSITIVE (A) NEGATIVE Final    Comment: (NOTE) The Xpert SA Assay (FDA approved for NASAL specimens in patients 12 years of age and older), is one component of a comprehensive surveillance program. It is not intended to  diagnose infection nor to guide or monitor treatment. Performed at [facility], [address], [place], [place] [address]      Radiology Studies: No results found.  Scheduled Meds:  aspirin EC  81 mg Oral Daily   atorvastatin  40 mg Oral Daily   Chlorhexidine Gluconate Cloth  6 each Topical Q0600   enoxaparin (LOVENOX) injection  40 mg Subcutaneous Q24H   folic acid  1 mg Oral Daily   Gerhardt's butt cream   Topical TID   multivitamin with minerals  1 tablet Oral Daily   mupirocin ointment  1 application Nasal BID   nicotine  21 mg Transdermal Daily   thiamine  100 mg Oral Daily   Or   thiamine  100 mg Intravenous Daily   Continuous Infusions:  lactated ringers 75 mL/hr at 11/24/20 0106   piperacillin-tazobactam (ZOSYN)  IV 3.375 g (11/24/20 0109)     LOS: 5 days   Time spent:  Azucena Fallen, DO [facility]  If 7PM-7AM, please contact night-coverage [email or web address]  11/24/2020, 7:24 AM

## 2020-11-24 NOTE — Consult Note (Signed)
[facility]    Date of Admission:  11/19/2020     Total days of antibiotics 5                   Reason for Consult: Fusobacterium bacteria     Referring Provider: [facility]  Primary Care Provider: Patient, No Pcp Per (Inactive)    Assessment: Jake Nguyen is a 55 y.o. male admitted under IVC for poor self care r/t alcohol abuse and draining wound from previous BKA from April 2022. He was found to have osteomyelitis and went for revision and ultimately AKA with vascular surgery on 8/13. 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 He still has a little erythema / edema and clear fluid from the surgical wound that will need monitoring but overall improved with what seems to be definitive source control with surgery. Would recommend 7 days IV antibiotics from surgery. Will change to unasyn for duration of treatment since no pseudomonas history.    Plan: Change zosyn to unasyn  Would treat through 8/19 as long as surgical wound looks OK Will need SNF at discharge     Principal Problem:   Cellulitis of left leg Active Problems:   S/P BKA (below knee amputation) unilateral, left (HCC)   Alcohol withdrawal syndrome (HCC)   Hypokalemia    aspirin EC  81 mg Oral Daily   atorvastatin  40 mg Oral Daily   Chlorhexidine Gluconate Cloth  6 each Topical Q0600   enoxaparin (LOVENOX) injection  40 mg Subcutaneous Q24H   folic acid  1 mg Oral Daily   Gerhardt's butt cream   Topical TID   multivitamin with minerals  1 tablet Oral Daily   mupirocin ointment  1 application Nasal BID   nicotine  21 mg Transdermal Daily   thiamine  100 mg Oral Daily   Or   thiamine  100 mg Intravenous Daily    HPI: Jake Nguyen is a 55 y.o. male admitted on 11/19/2020 from the community (homeless) for worsening drainage from  left leg wound and poor self care (IVC in placed from brother).   Mr. Jake Nguyen cannot recall when his original BKA was but tells me his stump has been draining about 2 months now. Has been about the same the whole time with regards to pain and drainage. He did have fevers, chills and malaise when he was admitted and found to have maggots and copious purulent drainage from the wound. He reports that he came to ER 2 weeks prior for wound dressing change.   In the ER he was overall stable with no fever, no leukocytosis and normal vital signs otherwise aside from mild tachycardia and tremors c/w alcohol withdrawal.   Blood cultures from 8/11 with fusobacterium nucleatum growing in 1/4 bottles (Anaerobic). He was started on vancomycin + zosyn in ER. MRI of the left stump with edema and suspicious characteristics of osteomyelitis with surrounding myositis and cellulitis. He was taken for revision of BKA which required AKA on 8/13 with Dr. Myra GianottiBrabham. Based on their assessment of the limb today there is some clear drainage but no areas of fluctuance. Mild surrounding erythema / edema. Mr. Jake Nguyen is requesting pain medication for his leg pain. He has a chronic skin rash that he continually itches on his arms.  Dispo planning - patient has no clue what to do. CM working with SNFs, possibly [facility].    Review of Systems: Review of Systems  Constitutional:  Negative for chills, fever and malaise/fatigue.  HENT:  Negative for tinnitus.   Eyes:  Negative for blurred vision and photophobia.  Respiratory:  Negative for cough and sputum production.   Cardiovascular:  Negative for chest pain.  Gastrointestinal:  Negative for diarrhea, nausea and vomiting.  Genitourinary:  Negative for dysuria.  Musculoskeletal:  Positive for joint pain. Negative for back pain and myalgias.  Skin:  Positive for itching and rash.  Neurological:  Negative for dizziness and headaches.   Past Medical History:  Diagnosis Date    Alcoholism (HCC)    Anxiety    Depression     Social History   Tobacco Use   Smoking status: Every Day    Packs/day: 0.50    Types: Cigarettes   Smokeless tobacco: Never  Substance Use Topics   Alcohol use: Yes    Comment: 6 pack perday   Drug use: Yes    Types: Marijuana    Family History  Family history unknown: Yes   Allergies  Allergen Reactions   Pork-Derived Products Other (See Comments)    Pt states he doesn't eat pork    OBJECTIVE: Blood pressure 104/86, pulse 87, temperature 98.8 F (37.1 C), temperature source Oral, resp. rate (!) 23, height 5\' 10"  (1.778 m), weight 74.8 kg, SpO2 100 %.  Physical Exam Vitals reviewed.  Constitutional:      Appearance: He is well-developed.     Comments: Sitting up in bed. No distress.   HENT:     Mouth/Throat:     Mouth: Mucous membranes are moist.     Dentition: Normal dentition. No dental abscesses.     Pharynx: Oropharynx is clear.  Eyes:     General: No scleral icterus.    Pupils: Pupils are equal, round, and reactive to light.  Cardiovascular:     Rate and Rhythm: Normal rate and regular rhythm.     Heart sounds: Normal heart sounds. No murmur heard. Pulmonary:     Effort: Pulmonary effort is normal. No respiratory distress.     Breath sounds: Normal breath sounds.     Comments: Breathing comfortably on room air.  Abdominal:     General: There is no distension.     Palpations: Abdomen is soft.     Tenderness: There is no abdominal tenderness.  Lymphadenopathy:     Cervical: No cervical adenopathy.  Skin:    General: Skin is warm and dry.     Capillary Refill: Capillary refill takes less than 2 seconds.     Findings: Rash (annular appearing scaled rashes with escoriations to bilateral arms. None appear superinfected.) present.  Neurological:     Mental Status: He is alert and oriented to person, place, and time.  Psychiatric:        Judgment: Judgment normal.     Lab Results Lab Results   Component Value Date   WBC 10.0 11/24/2020   HGB 9.9 (L) 11/24/2020   HCT 29.7 (L) 11/24/2020   MCV 84.1 11/24/2020   PLT 188 11/24/2020    Lab Results  Component Value Date   CREATININE 0.53 (L) 11/24/2020   BUN <5 (L) 11/24/2020   NA 134 (L) 11/24/2020   K 3.4 (L) 11/24/2020   CL 101 11/24/2020   CO2 27 11/24/2020    Lab Results  Component Value Date  ALT 23 11/19/2020   AST 37 11/19/2020   ALKPHOS 67 11/19/2020   BILITOT 0.3 11/19/2020     Microbiology: Recent Results (from the past 240 hour(s))  Blood Culture (routine x 2)     Status: Abnormal   Collection Time: 11/19/20 10:38 AM   Specimen: BLOOD RIGHT ARM  Result Value Ref Range Status   Specimen Description BLOOD RIGHT ARM  Final   Special Requests   Final    BOTTLES DRAWN AEROBIC AND ANAEROBIC Blood Culture adequate volume   Culture  Setup Time   Final    GRAM NEGATIVE RODS ANAEROBIC BOTTLE ONLY CRITICAL RESULT CALLED TO, READ BACK BY AND VERIFIED WITH: PHARMD J LEDFORD AT 0141 11/22/2020 BY L BENFIELD    Culture (A)  Final    FUSOBACTERIUM NUCLEATUM BETA LACTAMASE POSITIVE Performed at [facility], [address], [place], [place] [address]    Report Status 11/24/2020 FINAL  Final  Blood Culture ID Panel (Reflexed)     Status: None   Collection Time: 11/19/20 10:38 AM  Result Value Ref Range Status   Enterococcus faecalis NOT DETECTED NOT DETECTED Final   Enterococcus Faecium NOT DETECTED NOT DETECTED Final   Listeria monocytogenes NOT DETECTED NOT DETECTED Final   Staphylococcus species NOT DETECTED NOT DETECTED Final   Staphylococcus aureus (BCID) NOT DETECTED NOT DETECTED Final   Staphylococcus epidermidis NOT DETECTED NOT DETECTED Final   Staphylococcus lugdunensis NOT DETECTED NOT DETECTED Final   Streptococcus species NOT DETECTED NOT DETECTED Final   Streptococcus agalactiae NOT DETECTED NOT DETECTED Final   Streptococcus pneumoniae NOT DETECTED NOT DETECTED Final   Streptococcus  pyogenes NOT DETECTED NOT DETECTED Final   A.calcoaceticus-baumannii NOT DETECTED NOT DETECTED Final   Bacteroides fragilis NOT DETECTED NOT DETECTED Final   Enterobacterales NOT DETECTED NOT DETECTED Final   Enterobacter cloacae complex NOT DETECTED NOT DETECTED Final   Escherichia coli NOT DETECTED NOT DETECTED Final   Klebsiella aerogenes NOT DETECTED NOT DETECTED Final   Klebsiella oxytoca NOT DETECTED NOT DETECTED Final   Klebsiella pneumoniae NOT DETECTED NOT DETECTED Final   Proteus species NOT DETECTED NOT DETECTED Final   Salmonella species NOT DETECTED NOT DETECTED Final   Serratia marcescens NOT DETECTED NOT DETECTED Final   Haemophilus influenzae NOT DETECTED NOT DETECTED Final   Neisseria meningitidis NOT DETECTED NOT DETECTED Final   Pseudomonas aeruginosa NOT DETECTED NOT DETECTED Final   Stenotrophomonas maltophilia NOT DETECTED NOT DETECTED Final   Candida albicans NOT DETECTED NOT DETECTED Final   Candida auris NOT DETECTED NOT DETECTED Final   Candida glabrata NOT DETECTED NOT DETECTED Final   Candida krusei NOT DETECTED NOT DETECTED Final   Candida parapsilosis NOT DETECTED NOT DETECTED Final   Candida tropicalis NOT DETECTED NOT DETECTED Final   Cryptococcus neoformans/gattii NOT DETECTED NOT DETECTED Final    Comment: Performed at [facility], [address] [place], [place] [address]  Resp Panel by RT-PCR (Flu A&B, Covid) Nasopharyngeal Swab     Status: None   Collection Time: 11/19/20  6:16 PM   Specimen: Nasopharyngeal Swab; Nasopharyngeal(NP) swabs in vial transport medium  Result Value Ref Range Status   SARS Coronavirus 2 by RT PCR NEGATIVE NEGATIVE Final    Comment: (NOTE) SARS-CoV-2 target nucleic acids are NOT DETECTED.  The SARS-CoV-2 RNA is generally detectable in upper respiratory specimens during the acute phase of infection. The lowest concentration of SARS-CoV-2 viral copies this assay can detect is 138 copies/mL. A negative  result does  not preclude SARS-Cov-2 infection and should not be used as the sole basis for treatment or other patient management decisions. A negative result may occur with  improper specimen collection/handling, submission of specimen other than nasopharyngeal swab, presence of viral mutation(s) within the areas targeted by this assay, and inadequate number of viral copies(<138 copies/mL). A negative result must be combined with clinical observations, patient history, and epidemiological information. The expected result is Negative.  Fact Sheet for Patients:  BloggerCourse.com  Fact Sheet for Healthcare Providers:  SeriousBroker.it  This test is no t yet approved or cleared by the [place] FDA and  has been authorized for detection and/or diagnosis of SARS-CoV-2 by FDA under an Emergency Use Authorization (EUA). This EUA will remain  in effect (meaning this test can be used) for the duration of the COVID-19 declaration under Section 564(b)(1) of the Act, 21 U.S.C.section 360bbb-3(b)(1), unless the authorization is terminated  or revoked sooner.       Influenza A by PCR NEGATIVE NEGATIVE Final   Influenza B by PCR NEGATIVE NEGATIVE Final    Comment: (NOTE) The Xpert Xpress SARS-CoV-2/FLU/RSV plus assay is intended as an aid in the diagnosis of influenza from Nasopharyngeal swab specimens and should not be used as a sole basis for treatment. Nasal washings and aspirates are unacceptable for Xpert Xpress SARS-CoV-2/FLU/RSV testing.  Fact Sheet for Patients: BloggerCourse.com  Fact Sheet for Healthcare Providers: SeriousBroker.it  This test is not yet approved or cleared by the [place] FDA and has been authorized for detection and/or diagnosis of SARS-CoV-2 by FDA under an Emergency Use Authorization (EUA). This EUA will remain in effect (meaning this test can be used) for the  duration of the COVID-19 declaration under Section 564(b)(1) of the Act, 21 U.S.C. section 360bbb-3(b)(1), unless the authorization is terminated or revoked.  Performed at [facility], [address] [place], [place] [address]   Blood Culture (routine x 2)     Status: None (Preliminary result)   Collection Time: 11/19/20 10:35 PM   Specimen: BLOOD  Result Value Ref Range Status   Specimen Description BLOOD RIGHT HAND  Final   Special Requests   Final    BOTTLES DRAWN AEROBIC AND ANAEROBIC Blood Culture results may not be optimal due to an inadequate volume of blood received in culture bottles   Culture   Final    NO GROWTH 4 DAYS Performed at [facility], [address], [place], [place] [address]    Report Status PENDING  Incomplete  Urine Culture     Status: Abnormal   Collection Time: 11/20/20 12:30 AM   Specimen: In/Out Cath Urine  Result Value Ref Range Status   Specimen Description IN/OUT CATH URINE  Final   Special Requests   Final    NONE Performed at [facility], [address] [place], [place] [address]    Culture MULTIPLE SPECIES PRESENT, SUGGEST RECOLLECTION (A)  Final   Report Status 11/21/2020 FINAL  Final  Surgical pcr screen     Status: Abnormal   Collection Time: 11/20/20 10:49 PM   Specimen: Nasal Mucosa; Nasal Swab  Result Value Ref Range Status   MRSA, PCR NEGATIVE NEGATIVE Final   Staphylococcus aureus POSITIVE (A) NEGATIVE Final    Comment: (NOTE) The Xpert SA Assay (FDA approved for NASAL specimens in patients 44 years of age and older), is one component of a comprehensive surveillance program. It is not intended to diagnose infection  nor to guide or monitor treatment. Performed at [facility], [address], [place], [place] [address]     Rexene Alberts, MSN, NP-C [facility] Cell: [phone] Pager: [phone]  11/24/2020 3:09 PM

## 2020-11-24 NOTE — Progress Notes (Addendum)
  Progress Note    11/24/2020 8:27 AM 3 Days Post-Op  Subjective:  no complaints   Vitals:   11/24/20 0300 11/24/20 0400  BP:    Pulse:    Resp: 20 20  Temp:    SpO2: 97%    Physical Exam: Lungs:  non labored Incisions:  L AKA incision with some clear drainage but no areas of fluctuance; some surrounding erythema and edema Neurologic: A&O  CBC    Component Value Date/Time   WBC 10.0 11/24/2020 0108   RBC 3.53 (L) 11/24/2020 0108   HGB 9.9 (L) 11/24/2020 0108   HCT 29.7 (L) 11/24/2020 0108   HCT 36.4 (L) 05/20/2018 1923   PLT 188 11/24/2020 0108   MCV 84.1 11/24/2020 0108   MCH 28.0 11/24/2020 0108   MCHC 33.3 11/24/2020 0108   RDW 15.1 11/24/2020 0108   LYMPHSABS 1.6 11/19/2020 1033   MONOABS 0.6 11/19/2020 1033   EOSABS 0.2 11/19/2020 1033   BASOSABS 0.1 11/19/2020 1033    BMET    Component Value Date/Time   NA 134 (L) 11/24/2020 0108   K 3.4 (L) 11/24/2020 0108   CL 101 11/24/2020 0108   CO2 27 11/24/2020 0108   GLUCOSE 132 (H) 11/24/2020 0108   BUN <5 (L) 11/24/2020 0108   CREATININE 0.53 (L) 11/24/2020 0108   CALCIUM 8.1 (L) 11/24/2020 0108   GFRNONAA >60 11/24/2020 0108   GFRAA >60 11/12/2019 0359    INR    Component Value Date/Time   INR 1.0 11/19/2020 1033     Intake/Output Summary (Last 24 hours) at 11/24/2020 0827 Last data filed at 11/24/2020 0817 Gross per 24 hour  Intake 940 ml  Output 3950 ml  Net -3010 ml     Assessment/Plan:  55 y.o. male is s/p conversion of L BKA to AKA 3 Days Post-Op   Dressing changed; L AKA incision with some serous drainage and surrounding edema/erythema; continue daily changes Continue antibiotics for now    [name], PA-C [facility] [phone] 11/24/2020 8:27 AM  I have seen and evaluated the patient and agree with the above assessment and plan  Durene Cal

## 2020-11-25 DIAGNOSIS — F10230 Alcohol dependence with withdrawal, uncomplicated: Secondary | ICD-10-CM

## 2020-11-25 LAB — CBC
HCT: 29.7 % — ABNORMAL LOW (ref 39.0–52.0)
Hemoglobin: 9.9 g/dL — ABNORMAL LOW (ref 13.0–17.0)
MCH: 28 pg (ref 26.0–34.0)
MCHC: 33.3 g/dL (ref 30.0–36.0)
MCV: 84.1 fL (ref 80.0–100.0)
Platelets: 240 10*3/uL (ref 150–400)
RBC: 3.53 MIL/uL — ABNORMAL LOW (ref 4.22–5.81)
RDW: 15 % (ref 11.5–15.5)
WBC: 8.8 10*3/uL (ref 4.0–10.5)
nRBC: 0 % (ref 0.0–0.2)

## 2020-11-25 LAB — SURGICAL PATHOLOGY

## 2020-11-25 LAB — BASIC METABOLIC PANEL
Anion gap: 6 (ref 5–15)
BUN: <5 mg/dL — ABNORMAL LOW (ref 6–20)
CO2: 27 mmol/L (ref 22–32)
Calcium: 8.4 mg/dL — ABNORMAL LOW (ref 8.9–10.3)
Chloride: 100 mmol/L (ref 98–111)
Creatinine, Ser: 0.47 mg/dL — ABNORMAL LOW (ref 0.61–1.24)
GFR, Estimated: >60 mL/min (ref >60)
Glucose, Bld: 104 mg/dL — ABNORMAL HIGH (ref 70–99)
Potassium: 3.3 mmol/L — ABNORMAL LOW (ref 3.5–5.1)
Sodium: 133 mmol/L — ABNORMAL LOW (ref 135–145)

## 2020-11-25 LAB — CULTURE, BLOOD (ROUTINE X 2): Culture: NO GROWTH

## 2020-11-25 LAB — SARS CORONAVIRUS 2 (TAT 6-24 HRS): SARS Coronavirus 2: NEGATIVE

## 2020-11-25 MED ORDER — POTASSIUM CHLORIDE CRYS ER 20 MEQ PO TBCR
40.0000 meq | EXTENDED_RELEASE_TABLET | Freq: Once | ORAL | Status: AC
  Administered 2020-11-25: 40 meq via ORAL
  Filled 2020-11-25: qty 2

## 2020-11-25 NOTE — Progress Notes (Signed)
  Progress Note    11/25/2020 8:06 AM 4 Days Post-Op  Subjective:  Resting comfortably.. C/o post-op pain.   Vitals:   11/25/20 0349 11/25/20 0712  BP: 115/75 118/81  Pulse: 85 100  Resp: 16 20  Temp: 98 F (36.7 C) 98.3 F (36.8 C)  SpO2: 93% 95%    Physical Exam: General appearance: Awake, alert in no apparent distress Cardiac: Heart rate and rhythm are regular Respirations: Nonlabored Extremities: Amputation site incision is well approximated without bleeding or hematoma.  There is moderate edema of residual limb. The dressing from yesterday is dry and there is no apparent drainage this morning. Clean dressing reapplied.Anterior and posterior flaps are warm and well-perfused   CBC    Component Value Date/Time   WBC 8.8 11/25/2020 0118   RBC 3.53 (L) 11/25/2020 0118   HGB 9.9 (L) 11/25/2020 0118   HCT 29.7 (L) 11/25/2020 0118   HCT 36.4 (L) 05/20/2018 1923   PLT 240 11/25/2020 0118   MCV 84.1 11/25/2020 0118   MCH 28.0 11/25/2020 0118   MCHC 33.3 11/25/2020 0118   RDW 15.0 11/25/2020 0118   LYMPHSABS 1.6 11/19/2020 1033   MONOABS 0.6 11/19/2020 1033   EOSABS 0.2 11/19/2020 1033   BASOSABS 0.1 11/19/2020 1033    BMET    Component Value Date/Time   NA 133 (L) 11/25/2020 0118   K 3.3 (L) 11/25/2020 0118   CL 100 11/25/2020 0118   CO2 27 11/25/2020 0118   GLUCOSE 104 (H) 11/25/2020 0118   BUN <5 (L) 11/25/2020 0118   CREATININE 0.47 (L) 11/25/2020 0118   CALCIUM 8.4 (L) 11/25/2020 0118   GFRNONAA >60 11/25/2020 0118   GFRAA >60 11/12/2019 0359     Intake/Output Summary (Last 24 hours) at 11/25/2020 0806 Last data filed at 11/25/2020 0601 Gross per 24 hour  Intake 5600.72 ml  Output 1300 ml  Net 4300.72 ml    HOSPITAL MEDICATIONS Scheduled Meds:  aspirin EC  81 mg Oral Daily   atorvastatin  40 mg Oral Daily   enoxaparin (LOVENOX) injection  40 mg Subcutaneous Q24H   folic acid  1 mg Oral Daily   Gerhardt's butt cream   Topical TID   multivitamin  with minerals  1 tablet Oral Daily   mupirocin ointment  1 application Nasal BID   nicotine  21 mg Transdermal Daily   thiamine  100 mg Oral Daily   Or   thiamine  100 mg Intravenous Daily   Continuous Infusions:  ampicillin-sulbactam (UNASYN) IV 3 g (11/25/20 0411)   lactated ringers 75 mL/hr at 11/25/20 0718   PRN Meds:.acetaminophen **OR** acetaminophen, albuterol, alum & mag hydroxide-simeth, diphenhydrAMINE-zinc acetate, ibuprofen, LORazepam **OR** LORazepam, metoprolol tartrate, morphine injection, ondansetron **OR** ondansetron (ZOFRAN) IV, oxyCODONE-acetaminophen, senna-docusate, zolpidem  Assessment and Plan: POD 4 left AKA conversion from infected BKA. Moderate edema. No drainage at the present. VSS. Afebrile. Hgb 9.9 Continue antibiotics. Monitor staple line.    -DVT prophylaxis:  Lovenox  Wendi Maya, PA-C [facility] [phone] 11/25/2020  8:06 AM

## 2020-11-25 NOTE — Evaluation (Signed)
Physical Therapy Evaluation Patient Details Name: Jake Nguyen MRN: [id number] DOB: May 15, 1965 Today's Date: 11/25/2020   History of Present Illness  Pt is a 55 y/o male with nonhealing L BKA in April 22, never received follow up care.  Pt presented to ER with brother who filed IVC paperwork due to pt not properly caring for self/wound. Pt underwent L AKA on 8/13. PMH: PAD, HLD, ETOH abuse.   Clinical Impression  Patient pleasant, sitter in room. He requires supervision for bed mobility, reports pain in left limb. Min guard for sit to stand and for ambulation in hallway with RW. Patient limited by UE fatigue with gait. He will continue to benefit from skilled PT while here to improve strength and activity tolerance.     Follow Up Recommendations SNF;Supervision - Intermittent    Equipment Recommendations  Other (comment) (TBD, has wheelchair in room)    Recommendations for Other Services       Precautions / Restrictions Precautions Precautions: Fall Restrictions Weight Bearing Restrictions: Yes LLE Weight Bearing: Non weight bearing      Mobility  Bed Mobility Overal bed mobility: Needs Assistance Bed Mobility: Supine to Sit     Supine to sit: Supervision          Transfers Overall transfer level: Needs assistance Equipment used: Rolling walker (2 wheeled) Transfers: Sit to/from Stand Sit to Stand: Min guard            Ambulation/Gait Ambulation/Gait assistance: Min guard Gait Distance (Feet): 150 Feet Assistive device: Rolling walker (2 wheeled) Gait Pattern/deviations: Step-to pattern Gait velocity: decr   General Gait Details: Cues to not step beyond front of RW. Min guard, fatigued at end of walk, required a couple of standing rest breaks.  Stairs            Wheelchair Mobility    Modified Rankin (Stroke Patients Only)       Balance Overall balance assessment: Needs assistance Sitting-balance support: Feet supported Sitting  balance-Leahy Scale: Good     Standing balance support: Bilateral upper extremity supported;During functional activity Standing balance-Leahy Scale: Fair Standing balance comment: Reliant on B UE support, min guard                             Pertinent Vitals/Pain Pain Assessment: Faces Faces Pain Scale: Hurts even more Pain Location: L residual limb Pain Descriptors / Indicators: Grimacing;Moaning;Guarding;Sore Pain Intervention(s): Premedicated before session;Repositioned;Monitored during session    Home Living Family/patient expects to be discharged to:: Skilled nursing facility                 Additional Comments: Pt currently homeless, plans to DC to SNF    Prior Function Level of Independence: Needs assistance   Gait / Transfers Assistance Needed: use of wheelchair for mobility since L BKA in April 2022. endorses falls from wheelchair and reports this is due to alcohol  ADL's / Homemaking Assistance Needed: not properly caring for self per chart        Hand Dominance   Dominant Hand: Right    Extremity/Trunk Assessment   Upper Extremity Assessment Upper Extremity Assessment: Defer to OT evaluation    Lower Extremity Assessment Lower Extremity Assessment: Generalized weakness;LLE deficits/detail LLE: Unable to fully assess due to pain LLE Sensation: decreased light touch    Cervical / Trunk Assessment Cervical / Trunk Assessment: Normal  Communication   Communication: No difficulties  Cognition Arousal/Alertness: Awake/alert Behavior During Therapy:  WFL for tasks assessed/performed Overall Cognitive Status: No family/caregiver present to determine baseline cognitive functioning                                 General Comments: pleasant and appropriate responses. Follows directions well. Question awareness of safety and deficits due to hx of not caring for self properly. Also noted to push gait distance in hallway though  fatigued and shaky by end of walk. Did not formally assess cognition      General Comments      Exercises     Assessment/Plan    PT Assessment Patient needs continued PT services  PT Problem List Decreased strength;Decreased mobility;Decreased activity tolerance;Decreased balance;Pain;Decreased knowledge of use of DME;Decreased knowledge of precautions;Decreased safety awareness       PT Treatment Interventions DME instruction;Therapeutic exercise;Gait training;Balance training;Functional mobility training;Therapeutic activities;Patient/family education    PT Goals (Current goals can be found in the Care Plan section)  Acute Rehab PT Goals Patient Stated Goal: to go to rehab PT Goal Formulation: With patient Time For Goal Achievement: 12/09/20 Potential to Achieve Goals: Good    Frequency Min 3X/week   Barriers to discharge   homeless    Co-evaluation               AM-PAC PT "6 Clicks" Mobility  Outcome Measure Help needed turning from your back to your side while in a flat bed without using bedrails?: None Help needed moving from lying on your back to sitting on the side of a flat bed without using bedrails?: A Little Help needed moving to and from a bed to a chair (including a wheelchair)?: A Little Help needed standing up from a chair using your arms (e.g., wheelchair or bedside chair)?: A Little Help needed to walk in hospital room?: A Little Help needed climbing 3-5 steps with a railing? : A Little 6 Click Score: 19    End of Session Equipment Utilized During Treatment: Gait belt Activity Tolerance: Patient tolerated treatment well;Patient limited by fatigue Patient left: in bed;with call bell/phone within reach;with nursing/sitter in room Nurse Communication: Mobility status PT Visit Diagnosis: Muscle weakness (generalized) (M62.81);Difficulty in walking, not elsewhere classified (R26.2);Pain Pain - Right/Left: Left Pain - part of body: Leg    Time:  9355-2174 PT Time Calculation (min) (ACUTE ONLY): 15 min   Charges:   PT Evaluation $PT Eval Moderate Complexity: 1 Mod          Mariavictoria Nottingham, PT, GCS 11/25/20,11:52 AM

## 2020-11-25 NOTE — TOC CAGE-AID Note (Signed)
Transition of Care [facility]) - CAGE-AID Screening   Patient Details  Name: Jake Nguyen MRN: [id number] Date of Birth: 14-Jan-1966  Transition of Care [facility]) CM/SW Contact:    Janae Bridgeman, RN Phone Number: 11/25/2020, 11:58 AM   Clinical Narrative: Case assessment completed and substance abuse follow up resources included in the patient's follow up care since the patient is pending admission to [facility] at this time.   CAGE-AID Screening:    Have You Ever Felt You Ought to Cut Down on Your Drinking or Drug Use?: No Have People Annoyed You By Critizing Your Drinking Or Drug Use?: Yes Have You Felt Bad Or Guilty About Your Drinking Or Drug Use?: No Have You Ever Had a Drink or Used Drugs First Thing In The Morning to Steady Your Nerves or to Get Rid of a Hangover?: Yes CAGE-AID Score: 2  Substance Abuse Education Offered: Yes  Substance abuse interventions: Patient Counseling

## 2020-11-25 NOTE — Progress Notes (Signed)
[facility]  Date of Admission:  11/19/2020      Total days of antibiotics 6   Unasyn 8/16 >> current          ASSESSMENT: Jake Nguyen is a 55 y.o. 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 Now S/P AKA 8/13 with definitive management of source of infection. Would treat through 8/19. Stump with some edema described but does not appear to have any concern for infection.  Vascular following.   Will sign off - if anything changes with his condition or questions please call back.     PLAN: Unasyn through 8/19.  Plan for SNF discharge (still awaiting authorization / approval).    Principal Problem:   Gram-negative bacteremia Active Problems:   S/P BKA (below knee amputation) unilateral, left (HCC)   Cellulitis of left leg   Alcohol withdrawal syndrome (HCC)   Hypokalemia   Fusobacterium infection   Severe sepsis without septic shock (HCC)    aspirin EC  81 mg Oral Daily   atorvastatin  40 mg Oral Daily   enoxaparin (LOVENOX) injection  40 mg Subcutaneous Q24H   folic acid  1 mg Oral Daily   Gerhardt's butt cream   Topical TID   multivitamin with minerals  1 tablet Oral Daily   mupirocin ointment  1 application Nasal BID   nicotine  21 mg Transdermal Daily   thiamine  100 mg Oral Daily   Or   thiamine  100 mg Intravenous Daily    SUBJECTIVE: Sleeping in the room upon arrival. Asking for pain medication and antibiotics for left leg.  No new concerns/complaints.    Review of Systems: Review of Systems  Constitutional:  Negative for chills, fever, malaise/fatigue and weight loss.  HENT:  Negative for sore throat.   Respiratory:  Negative for cough and sputum production.   Cardiovascular:  Negative for chest pain and leg swelling.  Gastrointestinal:  Negative for abdominal pain, diarrhea and vomiting.  Genitourinary:  Negative for dysuria and flank pain.  Musculoskeletal:   Positive for joint pain. Negative for myalgias and neck pain.  Skin:  Positive for itching and rash.  Neurological:  Negative for dizziness, tingling and headaches.  Psychiatric/Behavioral:  Negative for depression and substance abuse. The patient is not nervous/anxious and does not have insomnia.     Allergies  Allergen Reactions   Pork-Derived Products Other (See Comments)    Pt states he doesn't eat pork    OBJECTIVE: Vitals:   11/25/20 0115 11/25/20 0349 11/25/20 0712 11/25/20 1113  BP: 104/71 115/75 118/81 115/75  Pulse: 94 85 100 96  Resp: 16 16 20 14   Temp: 98 F (36.7 C) 98 F (36.7 C) 98.3 F (36.8 C) 98.2 F (36.8 C)  TempSrc: Axillary Axillary Oral Oral  SpO2: 100% 93% 95% 100%  Weight:      Height:       Body mass index is 23.68 kg/m.  Physical Exam Vitals reviewed.  Constitutional:      Appearance: He is well-developed.     Comments: Sleeping when we came in.    HENT:     Mouth/Throat:     Dentition: Normal dentition. No dental abscesses.  Cardiovascular:     Rate and Rhythm: Normal rate and regular rhythm.     Heart sounds: Normal heart sounds.  Pulmonary:     Effort: Pulmonary effort is normal.  Breath sounds: Normal breath sounds.  Abdominal:     General: There is no distension.     Palpations: Abdomen is soft.     Tenderness: There is no abdominal tenderness.  Lymphadenopathy:     Cervical: No cervical adenopathy.  Skin:    General: Skin is warm and dry.     Capillary Refill: Capillary refill takes less than 2 seconds.     Findings: No rash.     Comments: Scaled annular rashes overlying arms. Stable   Neurological:     Mental Status: He is alert and oriented to person, place, and time.  Psychiatric:        Judgment: Judgment normal.    Lab Results Lab Results  Component Value Date   WBC 8.8 11/25/2020   HGB 9.9 (L) 11/25/2020   HCT 29.7 (L) 11/25/2020   MCV 84.1 11/25/2020   PLT 240 11/25/2020    Lab Results  Component Value  Date   CREATININE 0.47 (L) 11/25/2020   BUN <5 (L) 11/25/2020   NA 133 (L) 11/25/2020   K 3.3 (L) 11/25/2020   CL 100 11/25/2020   CO2 27 11/25/2020    Lab Results  Component Value Date   ALT 23 11/19/2020   AST 37 11/19/2020   ALKPHOS 67 11/19/2020   BILITOT 0.3 11/19/2020     Microbiology: Recent Results (from the past 240 hour(s))  Blood Culture (routine x 2)     Status: Abnormal   Collection Time: 11/19/20 10:38 AM   Specimen: BLOOD RIGHT ARM  Result Value Ref Range Status   Specimen Description BLOOD RIGHT ARM  Final   Special Requests   Final    BOTTLES DRAWN AEROBIC AND ANAEROBIC Blood Culture adequate volume   Culture  Setup Time   Final    GRAM NEGATIVE RODS ANAEROBIC BOTTLE ONLY CRITICAL RESULT CALLED TO, READ BACK BY AND VERIFIED WITH: PHARMD J LEDFORD AT 0141 11/22/2020 BY L BENFIELD    Culture (A)  Final    FUSOBACTERIUM NUCLEATUM BETA LACTAMASE POSITIVE Performed at [facility], [address], [place], [place] [address]    Report Status 11/24/2020 FINAL  Final  Blood Culture ID Panel (Reflexed)     Status: None   Collection Time: 11/19/20 10:38 AM  Result Value Ref Range Status   Enterococcus faecalis NOT DETECTED NOT DETECTED Final   Enterococcus Faecium NOT DETECTED NOT DETECTED Final   Listeria monocytogenes NOT DETECTED NOT DETECTED Final   Staphylococcus species NOT DETECTED NOT DETECTED Final   Staphylococcus aureus (BCID) NOT DETECTED NOT DETECTED Final   Staphylococcus epidermidis NOT DETECTED NOT DETECTED Final   Staphylococcus lugdunensis NOT DETECTED NOT DETECTED Final   Streptococcus species NOT DETECTED NOT DETECTED Final   Streptococcus agalactiae NOT DETECTED NOT DETECTED Final   Streptococcus pneumoniae NOT DETECTED NOT DETECTED Final   Streptococcus pyogenes NOT DETECTED NOT DETECTED Final   A.calcoaceticus-baumannii NOT DETECTED NOT DETECTED Final   Bacteroides fragilis NOT DETECTED NOT DETECTED Final   Enterobacterales NOT  DETECTED NOT DETECTED Final   Enterobacter cloacae complex NOT DETECTED NOT DETECTED Final   Escherichia coli NOT DETECTED NOT DETECTED Final   Klebsiella aerogenes NOT DETECTED NOT DETECTED Final   Klebsiella oxytoca NOT DETECTED NOT DETECTED Final   Klebsiella pneumoniae NOT DETECTED NOT DETECTED Final   Proteus species NOT DETECTED NOT DETECTED Final   Salmonella species NOT DETECTED NOT DETECTED Final   Serratia marcescens NOT DETECTED NOT DETECTED Final   Haemophilus  influenzae NOT DETECTED NOT DETECTED Final   Neisseria meningitidis NOT DETECTED NOT DETECTED Final   Pseudomonas aeruginosa NOT DETECTED NOT DETECTED Final   Stenotrophomonas maltophilia NOT DETECTED NOT DETECTED Final   Candida albicans NOT DETECTED NOT DETECTED Final   Candida auris NOT DETECTED NOT DETECTED Final   Candida glabrata NOT DETECTED NOT DETECTED Final   Candida krusei NOT DETECTED NOT DETECTED Final   Candida parapsilosis NOT DETECTED NOT DETECTED Final   Candida tropicalis NOT DETECTED NOT DETECTED Final   Cryptococcus neoformans/gattii NOT DETECTED NOT DETECTED Final    Comment: Performed at [facility], [address] [place], [place] [address]  Resp Panel by RT-PCR (Flu A&B, Covid) Nasopharyngeal Swab     Status: None   Collection Time: 11/19/20  6:16 PM   Specimen: Nasopharyngeal Swab; Nasopharyngeal(NP) swabs in vial transport medium  Result Value Ref Range Status   SARS Coronavirus 2 by RT PCR NEGATIVE NEGATIVE Final    Comment: (NOTE) SARS-CoV-2 target nucleic acids are NOT DETECTED.  The SARS-CoV-2 RNA is generally detectable in upper respiratory specimens during the acute phase of infection. The lowest concentration of SARS-CoV-2 viral copies this assay can detect is 138 copies/mL. A negative result does not preclude SARS-Cov-2 infection and should not be used as the sole basis for treatment or other patient management decisions. A negative result may occur with  improper specimen  collection/handling, submission of specimen other than nasopharyngeal swab, presence of viral mutation(s) within the areas targeted by this assay, and inadequate number of viral copies(<138 copies/mL). A negative result must be combined with clinical observations, patient history, and epidemiological information. The expected result is Negative.  Fact Sheet for Patients:  BloggerCourse.comhttps://www.fda.gov/media/152166/download  Fact Sheet for Healthcare Providers:  SeriousBroker.ithttps://www.fda.gov/media/152162/download  This test is no t yet approved or cleared by the [place] FDA and  has been authorized for detection and/or diagnosis of SARS-CoV-2 by FDA under an Emergency Use Authorization (EUA). This EUA will remain  in effect (meaning this test can be used) for the duration of the COVID-19 declaration under Section 564(b)(1) of the Act, 21 U.S.C.section 360bbb-3(b)(1), unless the authorization is terminated  or revoked sooner.       Influenza A by PCR NEGATIVE NEGATIVE Final   Influenza B by PCR NEGATIVE NEGATIVE Final    Comment: (NOTE) The Xpert Xpress SARS-CoV-2/FLU/RSV plus assay is intended as an aid in the diagnosis of influenza from Nasopharyngeal swab specimens and should not be used as a sole basis for treatment. Nasal washings and aspirates are unacceptable for Xpert Xpress SARS-CoV-2/FLU/RSV testing.  Fact Sheet for Patients: BloggerCourse.comhttps://www.fda.gov/media/152166/download  Fact Sheet for Healthcare Providers: SeriousBroker.ithttps://www.fda.gov/media/152162/download  This test is not yet approved or cleared by the [place] FDA and has been authorized for detection and/or diagnosis of SARS-CoV-2 by FDA under an Emergency Use Authorization (EUA). This EUA will remain in effect (meaning this test can be used) for the duration of the COVID-19 declaration under Section 564(b)(1) of the Act, 21 U.S.C. section 360bbb-3(b)(1), unless the authorization is terminated or revoked.  Performed at [facility], [address] [place], [place] [address]   Blood Culture (routine x 2)     Status: None   Collection Time: 11/19/20 10:35 PM   Specimen: BLOOD  Result Value Ref Range Status   Specimen Description BLOOD RIGHT HAND  Final   Special Requests   Final    BOTTLES DRAWN AEROBIC AND ANAEROBIC Blood Culture results may not be optimal  due to an inadequate volume of blood received in culture bottles   Culture   Final    NO GROWTH 5 DAYS Performed at [facility], [address], [place], [place] [address]    Report Status 11/25/2020 FINAL  Final  Urine Culture     Status: Abnormal   Collection Time: 11/20/20 12:30 AM   Specimen: In/Out Cath Urine  Result Value Ref Range Status   Specimen Description IN/OUT CATH URINE  Final   Special Requests   Final    NONE Performed at [facility], [address] [place], [place] [address]    Culture MULTIPLE SPECIES PRESENT, SUGGEST RECOLLECTION (A)  Final   Report Status 11/21/2020 FINAL  Final  Surgical pcr screen     Status: Abnormal   Collection Time: 11/20/20 10:49 PM   Specimen: Nasal Mucosa; Nasal Swab  Result Value Ref Range Status   MRSA, PCR NEGATIVE NEGATIVE Final   Staphylococcus aureus POSITIVE (A) NEGATIVE Final    Comment: (NOTE) The Xpert SA Assay (FDA approved for NASAL specimens in patients 81 years of age and older), is one component of a comprehensive surveillance program. It is not intended to diagnose infection nor to guide or monitor treatment. Performed at [facility], [address], [place], [place] [address]     Rexene Alberts, MSN, NP-C [facility] Cell: [phone] Pager: [phone]  11/25/2020  1:56 PM

## 2020-11-25 NOTE — Evaluation (Signed)
Occupational Therapy Evaluation Patient Details Name: Jake Nguyen MRN: [id number] DOB: [birth year] Today's Date: 11/25/2020    History of Present Illness Pt is a 55 y/o male with nonhealing L BKA in April 22, never received follow up care.  Pt presented to ER with brother who filed IVC paperwork due to pt not properly caring for self/wound. Pt underwent L AKA on 8/13. PMH: PAD, HLD, ETOH abuse.   Clinical Impression   PTA, pt homeless and had been using wheelchair for mobility though reports frequent falls from wheelchair level. Pt presents now with expected post-op deficits in standing balance, pain and endurance. Pt able to demo mobility with RW at min guard, Independent for UB ADLs and Min A for LB ADLs. Plan to progress compensatory strategies for LB ADLs and instruct in UE HEP to maximize strength in next sessions. Rec SNF as pt at increased risk for falls and infection at this time.  HR up to 151bpm with activity; normalizes quickly with seated rest breaks.    Follow Up Recommendations  SNF;Supervision - Intermittent    Equipment Recommendations  Other (comment) (defer to next venue)    Recommendations for Other Services       Precautions / Restrictions Precautions Precautions: Fall Restrictions Weight Bearing Restrictions: Yes LLE Weight Bearing: Non weight bearing      Mobility Bed Mobility Overal bed mobility: Needs Assistance Bed Mobility: Supine to Sit     Supine to sit: Supervision          Transfers Overall transfer level: Needs assistance Equipment used: Rolling walker (2 wheeled) Transfers: Sit to/from Stand Sit to Stand: Min guard         General transfer comment: able to rise from bed with RW min guard for safety, no LOB    Balance Overall balance assessment: Needs assistance Sitting-balance support: Feet supported Sitting balance-Leahy Scale: Good     Standing balance support: Bilateral upper extremity supported;During functional  activity Standing balance-Leahy Scale: Poor Standing balance comment: reliant on BUE support in standing due to AKA                           ADL either performed or assessed with clinical judgement   ADL Overall ADL's : Needs assistance/impaired Eating/Feeding: Independent   Grooming: Independent;Sitting   Upper Body Bathing: Independent;Sitting   Lower Body Bathing: Minimal assistance;Sit to/from stand   Upper Body Dressing : Independent;Sitting   Lower Body Dressing: Minimal assistance;Sit to/from stand   Toilet Transfer: Min guard;Ambulation;RW   Toileting- Clothing Manipulation and Hygiene: Minimal assistance;Sitting/lateral lean;Sit to/from stand         General ADL Comments: Pt with expected post op pain though mobilizing fairly well. deficits in standing balance expected, as well as endurance     Vision Patient Visual Report: No change from baseline Vision Assessment?: No apparent visual deficits     Perception     Praxis      Pertinent Vitals/Pain Pain Assessment: Faces Faces Pain Scale: Hurts even more Pain Location: L residual limb Pain Descriptors / Indicators: Grimacing;Moaning;Guarding;Sore Pain Intervention(s): Monitored during session;Premedicated before session     Hand Dominance Right   Extremity/Trunk Assessment Upper Extremity Assessment Upper Extremity Assessment: Overall WFL for tasks assessed   Lower Extremity Assessment Lower Extremity Assessment: Defer to PT evaluation LLE: Unable to fully assess due to pain LLE Sensation: decreased light touch   Cervical / Trunk Assessment Cervical / Trunk Assessment: Normal  Communication Communication Communication: No difficulties   Cognition Arousal/Alertness: Awake/alert Behavior During Therapy: WFL for tasks assessed/performed Overall Cognitive Status: No family/caregiver present to determine baseline cognitive functioning                                  General Comments: pleasant and appropriate responses. Follows directions well. Question awareness of safety and deficits due to hx of not caring for self properly. Also noted to push gait distance in hallway though fatigued and shaky by end of walk. Did not formally assess cognition   General Comments       Exercises     Shoulder Instructions      Home Living Family/patient expects to be discharged to:: Skilled nursing facility                                 Additional Comments: Pt currently homeless, plans to DC to SNF      Prior Functioning/Environment Level of Independence: Needs assistance  Gait / Transfers Assistance Needed: use of wheelchair for mobility since L BKA in April 2022. endorses falls from wheelchair and reports this is due to alcohol ADL's / Homemaking Assistance Needed: not properly caring for self per chart            OT Problem List: Decreased strength;Decreased activity tolerance;Impaired balance (sitting and/or standing);Decreased knowledge of use of DME or AE;Decreased safety awareness;Pain      OT Treatment/Interventions: Self-care/ADL training;Therapeutic exercise;DME and/or AE instruction;Therapeutic activities;Patient/family education;Balance training    OT Goals(Current goals can be found in the care plan section) Acute Rehab OT Goals Patient Stated Goal: to go to rehab OT Goal Formulation: With patient Time For Goal Achievement: 12/09/20 Potential to Achieve Goals: Good  OT Frequency: Min 2X/week   Barriers to D/C:            Co-evaluation              AM-PAC OT "6 Clicks" Daily Activity     Outcome Measure Help from another person eating meals?: None Help from another person taking care of personal grooming?: A Little Help from another person toileting, which includes using toliet, bedpan, or urinal?: A Little Help from another person bathing (including washing, rinsing, drying)?: A Little Help from another person  to put on and taking off regular upper body clothing?: A Little Help from another person to put on and taking off regular lower body clothing?: A Little 6 Click Score: 19   End of Session Equipment Utilized During Treatment: Gait belt;Rolling walker Nurse Communication: Mobility status  Activity Tolerance: Patient tolerated treatment well Patient left: in bed;with call bell/phone within reach;with nursing/sitter in room (safety sitter in room)  OT Visit Diagnosis: Unsteadiness on feet (R26.81);Other abnormalities of gait and mobility (R26.89);Muscle weakness (generalized) (M62.81);History of falling (Z91.81);Pain Pain - Right/Left: Left Pain - part of body: Leg                Time: 1448-1856 OT Time Calculation (min): 15 min Charges:  OT General Charges $OT Visit: 1 Visit OT Evaluation $OT Eval Moderate Complexity: 1 Mod  Bradd Canary, OTR/L Acute Rehab Services Office: [phone]   Lorre Munroe 11/25/2020, 12:03 PM

## 2020-11-25 NOTE — Progress Notes (Addendum)
PROGRESS NOTE    Jake Nguyen  XFG:[id number] DOB: February 15, 1966 DOA: 11/19/2020 PCP: Patient, No Pcp Per (Inactive)   Brief Narrative: Jake Nguyen is a 55 y.o. male with a history of PAD status post left BKA, hyperlipidemia, ethanol abuse.  Patient presented after his brother filed IVC paperwork secondary to patient not taking care of himself with an infected left BKA stump.  Patient was started on empiric antibiotics for sepsis and vascular surgery was consulted.  Patient underwent left AKA.  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   Assessment & Plan:   Principal Problem:   Gram-negative bacteremia Active Problems:   S/P BKA (below knee amputation) unilateral, left (HCC)   Cellulitis of left leg   Alcohol withdrawal syndrome (HCC)   Hypokalemia   Fusobacterium infection   Severe sepsis without septic shock (HCC)   Severe sepsis Present on admission. Secondary to left BKA osteomyelitis and bacteremia. Patient empirically treated with Vancomycin and Zosyn. Further antibiotic changes as mentioned below.  Left BKA osteomyelitis BKA from 08/05/2020. Vascular surgery consulted. Antibiotics as mentioned above and below. Vascular surgery performed left AKA on 8/13.  Fusobacterium nucleatum bacteremia ID on board. Patient managed empirically with Zosyn then transitioned to Unasyn IV with recommendations to continue course until 8/19.  Alcohol abuse Alcohol withdrawal CIWA protocol initiated. Now resolved.  Hypokalemia -Replete as needed  Peripheral artery disease -Continue aspirin and Lipitor  Hyperlipidemia -Continue Lipitor  Pressure injury Mid/upper sacrum. Unknown if present on admission.   DVT prophylaxis: Lovenox Code Status:   Code Status: Full Code Family Communication: None at bedside Disposition Plan: Discharge to SNF when bed is available and pending vascular/ID recommendations;  likely in 3 days pending completion of antibiotics   Consultants:  Infectious disease Vascular surgery  Procedures:  REVISION LEFT ABOVE KNEE AMPUTATION (11/21/2020)  Antimicrobials: Zosyn Vancomycin Unasyn    Subjective: No concerns this morning. Patient does not plan to quit drinking at this time.  Objective: Vitals:   11/24/20 2000 11/25/20 0115 11/25/20 0349 11/25/20 0712  BP: 117/75 104/71 115/75 118/81  Pulse: 89 94 85 100  Resp: 16 16 16 20   Temp: 98.3 F (36.8 C) 98 F (36.7 C) 98 F (36.7 C) 98.3 F (36.8 C)  TempSrc: Oral Axillary Axillary Oral  SpO2: 98% 100% 93% 95%  Weight:      Height:        Intake/Output Summary (Last 24 hours) at 11/25/2020 0911 Last data filed at 11/25/2020 0601 Gross per 24 hour  Intake 5360.72 ml  Output 1300 ml  Net 4060.72 ml   Filed Weights   11/20/20 0218  Weight: 74.8 kg    Examination:  General exam: Appears calm and comfortable Respiratory system: Clear to auscultation. Respiratory effort normal. Cardiovascular system: S1 & S2 heard, RRR. No murmurs, rubs, gallops or clicks. Gastrointestinal system: Abdomen is nondistended, soft and nontender. No organomegaly or masses felt. Normal bowel sounds heard. Central nervous system: Alert and oriented. No focal neurological deficits. Musculoskeletal: No edema. No calf tenderness. Left AKA with clean/dry dressing intact Skin: No cyanosis. No rashes Psychiatry: Judgement and insight appear normal. Mood & affect appropriate.     Data Reviewed: I have personally reviewed following labs and imaging studies  CBC Lab Results  Component Value Date   WBC 8.8 11/25/2020   RBC 3.53 (L) 11/25/2020   HGB 9.9 (L) 11/25/2020   HCT 29.7 (L) 11/25/2020   MCV 84.1  11/25/2020   MCH 28.0 11/25/2020   PLT 240 11/25/2020   MCHC 33.3 11/25/2020   RDW 15.0 11/25/2020   LYMPHSABS 1.6 11/19/2020   MONOABS 0.6 11/19/2020   EOSABS 0.2 11/19/2020   BASOSABS 0.1 11/19/2020     Last  metabolic panel Lab Results  Component Value Date   NA 133 (L) 11/25/2020   K 3.3 (L) 11/25/2020   CL 100 11/25/2020   CO2 27 11/25/2020   BUN <5 (L) 11/25/2020   CREATININE 0.47 (L) 11/25/2020   GLUCOSE 104 (H) 11/25/2020   GFRNONAA >60 11/25/2020   GFRAA >60 11/12/2019   CALCIUM 8.4 (L) 11/25/2020   PHOS 3.4 11/24/2020   PROT 8.4 (H) 11/19/2020   ALBUMIN 3.0 (L) 11/19/2020   BILITOT 0.3 11/19/2020   ALKPHOS 67 11/19/2020   AST 37 11/19/2020   ALT 23 11/19/2020   ANIONGAP 6 11/25/2020    CBG (last 3)  No results for input(s): GLUCAP in the last 72 hours.   GFR: Estimated Creatinine Clearance: 110.3 mL/min (A) (by C-G formula based on SCr of 0.47 mg/dL (L)).  Coagulation Profile: Recent Labs  Lab 11/19/20 1033  INR 1.0    Recent Results (from the past 240 hour(s))  Blood Culture (routine x 2)     Status: Abnormal   Collection Time: 11/19/20 10:38 AM   Specimen: BLOOD RIGHT ARM  Result Value Ref Range Status   Specimen Description BLOOD RIGHT ARM  Final   Special Requests   Final    BOTTLES DRAWN AEROBIC AND ANAEROBIC Blood Culture adequate volume   Culture  Setup Time   Final    GRAM NEGATIVE RODS ANAEROBIC BOTTLE ONLY CRITICAL RESULT CALLED TO, READ BACK BY AND VERIFIED WITH: PHARMD J LEDFORD AT 0141 11/22/2020 BY L BENFIELD    Culture (A)  Final    FUSOBACTERIUM NUCLEATUM BETA LACTAMASE POSITIVE Performed at [facility], [address] [place], [place] [address]    Report Status 11/24/2020 FINAL  Final  Blood Culture ID Panel (Reflexed)     Status: None   Collection Time: 11/19/20 10:38 AM  Result Value Ref Range Status   Enterococcus faecalis NOT DETECTED NOT DETECTED Final   Enterococcus Faecium NOT DETECTED NOT DETECTED Final   Listeria monocytogenes NOT DETECTED NOT DETECTED Final   Staphylococcus species NOT DETECTED NOT DETECTED Final   Staphylococcus aureus (BCID) NOT DETECTED NOT DETECTED Final   Staphylococcus epidermidis NOT DETECTED  NOT DETECTED Final   Staphylococcus lugdunensis NOT DETECTED NOT DETECTED Final   Streptococcus species NOT DETECTED NOT DETECTED Final   Streptococcus agalactiae NOT DETECTED NOT DETECTED Final   Streptococcus pneumoniae NOT DETECTED NOT DETECTED Final   Streptococcus pyogenes NOT DETECTED NOT DETECTED Final   A.calcoaceticus-baumannii NOT DETECTED NOT DETECTED Final   Bacteroides fragilis NOT DETECTED NOT DETECTED Final   Enterobacterales NOT DETECTED NOT DETECTED Final   Enterobacter cloacae complex NOT DETECTED NOT DETECTED Final   Escherichia coli NOT DETECTED NOT DETECTED Final   Klebsiella aerogenes NOT DETECTED NOT DETECTED Final   Klebsiella oxytoca NOT DETECTED NOT DETECTED Final   Klebsiella pneumoniae NOT DETECTED NOT DETECTED Final   Proteus species NOT DETECTED NOT DETECTED Final   Salmonella species NOT DETECTED NOT DETECTED Final   Serratia marcescens NOT DETECTED NOT DETECTED Final   Haemophilus influenzae NOT DETECTED NOT DETECTED Final   Neisseria meningitidis NOT DETECTED NOT DETECTED Final   Pseudomonas aeruginosa NOT DETECTED NOT DETECTED Final   Stenotrophomonas maltophilia NOT  DETECTED NOT DETECTED Final   Candida albicans NOT DETECTED NOT DETECTED Final   Candida auris NOT DETECTED NOT DETECTED Final   Candida glabrata NOT DETECTED NOT DETECTED Final   Candida krusei NOT DETECTED NOT DETECTED Final   Candida parapsilosis NOT DETECTED NOT DETECTED Final   Candida tropicalis NOT DETECTED NOT DETECTED Final   Cryptococcus neoformans/gattii NOT DETECTED NOT DETECTED Final    Comment: Performed at [facility], [address], [place], [place] [address]  Resp Panel by RT-PCR (Flu A&B, Covid) Nasopharyngeal Swab     Status: None   Collection Time: 11/19/20  6:16 PM   Specimen: Nasopharyngeal Swab; Nasopharyngeal(NP) swabs in vial transport medium  Result Value Ref Range Status   SARS Coronavirus 2 by RT PCR NEGATIVE NEGATIVE Final    Comment:  (NOTE) SARS-CoV-2 target nucleic acids are NOT DETECTED.  The SARS-CoV-2 RNA is generally detectable in upper respiratory specimens during the acute phase of infection. The lowest concentration of SARS-CoV-2 viral copies this assay can detect is 138 copies/mL. A negative result does not preclude SARS-Cov-2 infection and should not be used as the sole basis for treatment or other patient management decisions. A negative result may occur with  improper specimen collection/handling, submission of specimen other than nasopharyngeal swab, presence of viral mutation(s) within the areas targeted by this assay, and inadequate number of viral copies(<138 copies/mL). A negative result must be combined with clinical observations, patient history, and epidemiological information. The expected result is Negative.  Fact Sheet for Patients:  BloggerCourse.com  Fact Sheet for Healthcare Providers:  SeriousBroker.it  This test is no t yet approved or cleared by the [place] FDA and  has been authorized for detection and/or diagnosis of SARS-CoV-2 by FDA under an Emergency Use Authorization (EUA). This EUA will remain  in effect (meaning this test can be used) for the duration of the COVID-19 declaration under Section 564(b)(1) of the Act, 21 U.S.C.section 360bbb-3(b)(1), unless the authorization is terminated  or revoked sooner.       Influenza A by PCR NEGATIVE NEGATIVE Final   Influenza B by PCR NEGATIVE NEGATIVE Final    Comment: (NOTE) The Xpert Xpress SARS-CoV-2/FLU/RSV plus assay is intended as an aid in the diagnosis of influenza from Nasopharyngeal swab specimens and should not be used as a sole basis for treatment. Nasal washings and aspirates are unacceptable for Xpert Xpress SARS-CoV-2/FLU/RSV testing.  Fact Sheet for Patients: BloggerCourse.com  Fact Sheet for Healthcare  Providers: SeriousBroker.it  This test is not yet approved or cleared by the [place] FDA and has been authorized for detection and/or diagnosis of SARS-CoV-2 by FDA under an Emergency Use Authorization (EUA). This EUA will remain in effect (meaning this test can be used) for the duration of the COVID-19 declaration under Section 564(b)(1) of the Act, 21 U.S.C. section 360bbb-3(b)(1), unless the authorization is terminated or revoked.  Performed at [facility], [address] [place], [place] [address]   Blood Culture (routine x 2)     Status: None (Preliminary result)   Collection Time: 11/19/20 10:35 PM   Specimen: BLOOD  Result Value Ref Range Status   Specimen Description BLOOD RIGHT HAND  Final   Special Requests   Final    BOTTLES DRAWN AEROBIC AND ANAEROBIC Blood Culture results may not be optimal due to an inadequate volume of blood received in culture bottles   Culture   Final    NO GROWTH 4 DAYS Performed at [facility], [address] [place], [place] [address]    Report Status PENDING  Incomplete  Urine Culture     Status: Abnormal   Collection Time: 11/20/20 12:30 AM   Specimen: In/Out Cath Urine  Result Value Ref Range Status   Specimen Description IN/OUT CATH URINE  Final   Special Requests   Final    NONE Performed at [facility], [address] [place], [place] [address]    Culture MULTIPLE SPECIES PRESENT, SUGGEST RECOLLECTION (A)  Final   Report Status 11/21/2020 FINAL  Final  Surgical pcr screen     Status: Abnormal   Collection Time: 11/20/20 10:49 PM   Specimen: Nasal Mucosa; Nasal Swab  Result Value Ref Range Status   MRSA, PCR NEGATIVE NEGATIVE Final   Staphylococcus aureus POSITIVE (A) NEGATIVE Final    Comment: (NOTE) The Xpert SA Assay (FDA approved for NASAL specimens in patients 73 years of age and older), is one component of a comprehensive surveillance program. It is not intended to  diagnose infection nor to guide or monitor treatment. Performed at [facility], [address], [place], [place] [address]         Radiology Studies: No results found.      Scheduled Meds:  aspirin EC  81 mg Oral Daily   atorvastatin  40 mg Oral Daily   enoxaparin (LOVENOX) injection  40 mg Subcutaneous Q24H   folic acid  1 mg Oral Daily   Gerhardt's butt cream   Topical TID   multivitamin with minerals  1 tablet Oral Daily   mupirocin ointment  1 application Nasal BID   nicotine  21 mg Transdermal Daily   potassium chloride  40 mEq Oral Once   thiamine  100 mg Oral Daily   Or   thiamine  100 mg Intravenous Daily   Continuous Infusions:  ampicillin-sulbactam (UNASYN) IV 3 g (11/25/20 0811)   lactated ringers 75 mL/hr at 11/25/20 0718     LOS: 6 days     Jacquelin Hawking, MD [facility] 11/25/2020, 9:11 AM  If 7PM-7AM, please contact night-coverage [email or web address]

## 2020-11-25 NOTE — TOC Progression Note (Signed)
Transition of Care [facility]) - Progression Note    Patient Details  Name: Jake Nguyen MRN: [id number] Date of Birth: 1965/07/18  [facility]) CM/SW Contact  Curlene Labrum, RN Phone Number: 11/25/2020, 11:18 AM  Clinical Narrative:    Case management met with the patient at the bedside and discussed with him that [place] has accepted him for admission to the facility and that insurance authorization is currently pending.  Patient has accepted the offer to the facility and states that he will notify his sister of his pending placement with the facility.  I spoke to the patient regarding his ETOH use and he is aware of outpatient resources for substance abuse counseling and these resources will be included in his discharge follow up.  COVID Screening was ordered by the attending physician and the patient can transfer to the facility once insurance approves.  CM called and spoke with Cherlynn, [place] at [facility] and she was notified that [facility] has accepted the patient for admission and we are waiting on authorization approval.  CM and MSW will continue to follow the patient for SNF placement.   Expected Discharge Plan: Skilled Nursing Facility Barriers to Discharge: Homeless with medical needs, Continued Medical Work up, Unsafe home situation  Expected Discharge Plan and Services Expected Discharge Plan: [place] In-house Referral: Clinical Social Work Discharge Planning Services: CM Consult Post Acute Care Choice: [place] arrangements for the past 2 months: Homeless                                       Social Determinants of Health (SDOH) Interventions    Readmission Risk Interventions Readmission Risk Prevention Plan 11/25/2020 08/10/2020  Transportation Screening Complete Complete  PCP or Specialist Appt within 5-7 Days - Complete  PCP or Specialist Appt within 3-5 Days Complete -   Home Care Screening - Complete  Medication Review (RN CM) - Complete  HRI or Home Care Consult Complete -  Social Work Consult for [place] Planning/Counseling Complete -  Palliative Care Screening Not Applicable -  Medication Review Press photographer) Complete -  Some recent data might be hidden

## 2020-11-25 NOTE — Progress Notes (Signed)
CSW spoke with Delorise Shiner at [facility] to confirm patient's bed offer - Delorise Shiner to initiate insurance authorization.  CSW requested a new COVID test be ordered from MD.  Edwin Dada, MSW, LCSW Transitions of Care  Clinical Social Worker II [phone]

## 2020-11-26 LAB — POTASSIUM: Potassium: 3.7 mmol/L (ref 3.5–5.1)

## 2020-11-26 MED ORDER — ADULT MULTIVITAMIN W/MINERALS CH: 1.0000 | ORAL_TABLET | Freq: Every day | ORAL | Status: DC

## 2020-11-26 MED ORDER — OXYCODONE-ACETAMINOPHEN 5-325 MG PO TABS: 1.0000 | ORAL_TABLET | ORAL | 0 refills | Status: DC | PRN

## 2020-11-26 MED ORDER — AMOXICILLIN-POT CLAVULANATE 500-125 MG PO TABS: 1.0000 | ORAL_TABLET | Freq: Three times a day (TID) | ORAL | Status: AC

## 2020-11-26 NOTE — Progress Notes (Addendum)
Attempted to call report 3X to [phone] with no answer. Left VM to return my call.  Yong Wahlquist Dennard Nip

## 2020-11-26 NOTE — Progress Notes (Signed)
PTAR arrived for patient to go to [place].  IV removed.  Harriet Masson, RN

## 2020-11-26 NOTE — Progress Notes (Signed)
Orthopedic Tech Progress Note Patient Details:  Jake Nguyen 11-20-1965 [id number] Called in order to [facility] Patient ID: Jake Nguyen, male   DOB: 1965/04/22, 55 y.o.   MRN: [id number]  Jake Nguyen 11/26/2020, 10:07 AM

## 2020-11-26 NOTE — Discharge Instructions (Signed)
Jake Nguyen,  You were in the hospital with an infected stump and required an above knee amputation. You are now doing better and are stable.

## 2020-11-26 NOTE — TOC Progression Note (Signed)
Transition of Care [facility]) - Progression Note    Patient Details  Name: Jake Nguyen MRN: [id number] Date of Birth: [birth year]  [facility] CM/SW Contact  Janae Bridgeman, RN Phone Number: 11/26/2020, 10:46 AM  Clinical Narrative:    Case management called and spoke with the patient's nephew, Jake Nguyen [phone] and he is going to transport the patient's wheelchair over to [facility] for the patient since the ambulance is unable to transport the equipment.  Bedside nursing is aware and will label the patient's wheelchair for the family to pick up on the unit.  I called and spoke with the patient on the phone and gave him the number of the nephew and made him aware that family would assist with wheelchair delivery.   Expected Discharge Plan: Skilled Nursing Facility Barriers to Discharge: Homeless with medical needs, Continued Medical Work up, Unsafe home situation  Expected Discharge Plan and Services Expected Discharge Plan: Skilled Nursing Facility In-house Referral: Clinical Social Work Discharge Planning Services: CM Consult Post Acute Care Choice: Skilled Nursing Facility Living arrangements for the past 2 months: Homeless                                       Social Determinants of Health (SDOH) Interventions Alcohol Brief Interventions/Follow-up: Alcohol education/Brief advice  Readmission Risk Interventions Readmission Risk Prevention Plan 11/25/2020 08/10/2020  Transportation Screening Complete Complete  PCP or Specialist Appt within 5-7 Days - Complete  PCP or Specialist Appt within 3-5 Days Complete -  Home Care Screening - Complete  Medication Review (RN CM) - Complete  HRI or Home Care Consult Complete -  Social Work Consult for Recovery Care Planning/Counseling Complete -  Palliative Care Screening Not Applicable -  Medication Review Oceanographer) Complete -  Some recent data might be hidden

## 2020-11-26 NOTE — Progress Notes (Signed)
Report given to nurse at [facility].  Harriet Masson, RN

## 2020-11-26 NOTE — Progress Notes (Addendum)
  Progress Note    11/26/2020 8:34 AM 5 Days Post-Op  Subjective:  No complaints   Vitals:   11/25/20 2336 11/26/20 0428  BP: 111/62 116/79  Pulse: 68 99  Resp: 13 14  Temp: 98.3 F (36.8 C) 97.9 F (36.6 C)  SpO2: 95% 98%   Physical Exam: Lungs:  non labored Incisions:  L AKA incision without purulence; skin edges viable Neurologic: A&O  CBC    Component Value Date/Time   WBC 8.8 11/25/2020 0118   RBC 3.53 (L) 11/25/2020 0118   HGB 9.9 (L) 11/25/2020 0118   HCT 29.7 (L) 11/25/2020 0118   HCT 36.4 (L) 05/20/2018 1923   PLT 240 11/25/2020 0118   MCV 84.1 11/25/2020 0118   MCH 28.0 11/25/2020 0118   MCHC 33.3 11/25/2020 0118   RDW 15.0 11/25/2020 0118   LYMPHSABS 1.6 11/19/2020 1033   MONOABS 0.6 11/19/2020 1033   EOSABS 0.2 11/19/2020 1033   BASOSABS 0.1 11/19/2020 1033    BMET    Component Value Date/Time   NA 133 (L) 11/25/2020 0118   K 3.7 11/26/2020 0248   CL 100 11/25/2020 0118   CO2 27 11/25/2020 0118   GLUCOSE 104 (H) 11/25/2020 0118   BUN <5 (L) 11/25/2020 0118   CREATININE 0.47 (L) 11/25/2020 0118   CALCIUM 8.4 (L) 11/25/2020 0118   GFRNONAA >60 11/25/2020 0118   GFRAA >60 11/12/2019 0359    INR    Component Value Date/Time   INR 1.0 11/19/2020 1033     Intake/Output Summary (Last 24 hours) at 11/26/2020 0834 Last data filed at 11/26/2020 0431 Gross per 24 hour  Intake 803 ml  Output 3950 ml  Net -3147 ml     Assessment/Plan:  55 y.o. male is s/p L AKA 5 Days Post-Op   L AKA incision edematous but skin edges viable and no obvious signs of tissue necrosis or infection Continue IV antibiotics until final dose tomorrow 8/19 per ID Retention sock ordered Ok for d/c to SNF after final dose of abx tomorrow   [name], PA-C [facility] [phone] 11/26/2020 8:34 AM   I agree with the above.  Stump looks good.  F/U 4 weeks for staple removal  [place] Rochanda Harpham

## 2020-11-26 NOTE — Progress Notes (Signed)
Mobility Specialist: Progress Note   11/26/20 1457  Mobility  Activity Refused mobility   Pt refused mobility d/t pain level. RN present in the room.   [facility] Carlon Chaloux Mobility Specialist Mobility Specialist Phone: [phone]

## 2020-11-26 NOTE — Discharge Summary (Signed)
Physician Discharge Summary  Jake Nguyen:[id number] DOB: 02-Nov-1965 DOA: 11/19/2020  PCP: Patient, No Pcp Per (Inactive)  Admit date: 11/19/2020 Discharge date: 11/26/2020  Admitted From: Homeless Disposition: SNF  Recommendations for Outpatient Follow-up:  Follow up with vascular surgery in 5 weeks Remove PICC after completion of Unasyn IV Please follow up on the following pending results: None  Discharge Condition: Stable CODE STATUS: Full code Diet recommendation: Regular diet   Brief/Interim Summary:  Admission HPI written by Carlton Adam, MD   HPI: Jake Nguyen is a 55 y.o. male with medical history significant for PAD, HLD, ETOH abuse, s/p left BKA in April of 2022 which he never followed up for and still has retained staples. His brother filed IVC paperwork on Jake Nguyen this am because he has not been taking care of himself. Brother stated to the ER provider that he has seen pus and maggots in the wound. Jake Nguyen admits that the wound has drained white purulent discharge.  He reports has been swollen and very red over the last few days.  He states he drinks 3 to 440 ounce beers a day with the last time being earlier this morning.  On the emergency room he did have an elevated alcohol level when he first arrived.  He is now tremulous and going through withdrawals and has required Ativan for DTs.  Patient was seen earlier this week for the same problem and was admitted but he left AMA a few hours later.  He has not been compliant with his medications or antibiotics in the past.  He states he is not sure what medications he used to take but he has not taken in a while he states. Reports he smokes at least 1 pack of cigarettes per day.  He denies IV or illicit drug use.  Hospital course:  Severe sepsis Present on admission. Secondary to left BKA osteomyelitis and bacteremia. Patient empirically treated with Vancomycin and Zosyn. Further antibiotic changes as  mentioned below.   Left BKA osteomyelitis BKA from 08/05/2020. Vascular surgery consulted. Antibiotics as mentioned above and below. Vascular surgery performed left AKA on 8/13. Percocet prn for pain. 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 Patient managed empirically with Zosyn then transitioned to Unasyn IV with recommendations to continue course until 8/19. Discharged on Augmentin to complete course.   Alcohol abuse Alcohol withdrawal CIWA protocol initiated. Now resolved.   Hypokalemia Resolved with repletion.   Peripheral artery disease Continue aspirin and Lipitor   Hyperlipidemia Continue Lipitor   Pressure injury Mid/upper sacrum. Unknown if present on admission.  Discharge Diagnoses:  Principal Problem:   Gram-negative bacteremia Active Problems:   S/P BKA (below knee amputation) unilateral, left (HCC)   Cellulitis of left leg   Alcohol withdrawal syndrome (HCC)   Hypokalemia   Fusobacterium infection   Severe sepsis without septic shock [facility])    Discharge Instructions  Discharge Instructions     Diet - low sodium heart healthy   Complete by: As directed    Discharge wound care:   Complete by: As directed    Redress wound with kerlix   Increase activity slowly   Complete by: As directed       Allergies as of 11/26/2020       Reactions   Pork-derived Products Other (See Comments)   Pt states he doesn't eat pork        Medication List  STOP taking these medications    acetaminophen 325 MG tablet Commonly known as: TYLENOL   Baclofen 5 MG Tabs   FeroSul 325 (65 FE) MG tablet Generic drug: ferrous sulfate   pregabalin 150 MG capsule Commonly known as: LYRICA   QUEtiapine 25 MG tablet Commonly known as: SEROQUEL   Senexon-S 8.6-50 MG tablet Generic drug: senna-docusate   thiamine 100 MG tablet   V-R ASPIRIN EC 325 MG EC tablet Generic drug: aspirin       TAKE these  medications    amoxicillin-clavulanate 500-125 MG tablet Commonly known as: Augmentin Take 1 tablet (500 mg total) by mouth 3 (three) times daily for 2 days.   atorvastatin 40 MG tablet Commonly known as: LIPITOR Take 1 tablet (40 mg total) by mouth daily.   folic acid 1 MG tablet Commonly known as: FOLVITE Take 1 tablet (1 mg total) by mouth daily.   multivitamin with minerals Tabs tablet Take 1 tablet by mouth daily. Start taking on: November 27, 2020   oxyCODONE-acetaminophen 5-325 MG tablet Commonly known as: Percocet Take 1 tablet by mouth every 4 (four) hours as needed for severe pain.               Discharge Care Instructions  (From admission, onward)           Start     Ordered   11/26/20 0000  Discharge wound care:       Comments: Redress wound with kerlix   11/26/20 1251            Follow-up Information     [facility] Follow up in 5 week(s).   Specialty: Vascular Surgery Why: sent Contact information: [address] [place] [address] [phone]               Allergies  Allergen Reactions   Pork-Derived Products Other (See Comments)    Pt states he doesn't eat pork    Consultations: Vascular surgery Infectious disease   Procedures/Studies: DG Skull 1-3 Views  Result Date: 11/19/2020 CLINICAL DATA:  Foreign body assessment for MRI. EXAM: SKULL - 1-3 VIEW COMPARISON:  None. FINDINGS: There is no evidence of skull fracture or other focal bone lesions. Dental hardware. No radiopaque foreign body. IMPRESSION: No radiopaque foreign body. Electronically Signed   By: Maudry Mayhew MD   On: 11/19/2020 23:28   DG Chest 1 View  Result Date: 11/19/2020 CLINICAL DATA:  Right BKA stump infection. EXAM: CHEST  1 VIEW COMPARISON:  05/20/2018 FINDINGS: The cardiac silhouette, mediastinal and hilar contours are within normal limits. The lungs are clear. The bony thorax is intact. IMPRESSION: No  acute cardiopulmonary findings. Electronically Signed   By: Rudie Meyer M.D.   On: 11/19/2020 15:44   DG Pelvis 1-2 Views  Result Date: 11/19/2020 CLINICAL DATA:  Foreign body assessment for MRI EXAM: PELVIS - 1-2 VIEW COMPARISON:  None. FINDINGS: There is no evidence of pelvic fracture or diastasis. No pelvic bone lesions are seen. IMPRESSION: No radiopaque foreign body. Electronically Signed   By: Maudry Mayhew MD   On: 11/19/2020 23:27   DG Knee 1-2 Views Left  Result Date: 11/19/2020 CLINICAL DATA:  Stump infection EXAM: LEFT KNEE - 1-2 VIEW COMPARISON:  None. FINDINGS: No acute fracture or dislocation. Diffuse osteopenia. Osseous irregularity and periosteal elevation of the distal tibia and fibular osteotomy sites. No obvious bony destruction. Soft tissue swelling of the distal stump with areas of soft tissue irregularity. IMPRESSION:  Soft tissue swelling of the distal stump. Osseous irregularity and periosteal reaction of the tibia and fibular osteotomy sites which could which could be related to osteotomy, although osteomyelitis cannot be excluded. Finding could be further evaluated with MRI. Electronically Signed   By: Allegra Lai MD   On: 11/19/2020 15:57   DG Abdomen 1 View  Result Date: 11/19/2020 CLINICAL DATA:  Foreign body assessment for MRI. EXAM: ABDOMEN - 1 VIEW COMPARISON:  None. FINDINGS: The bowel gas pattern is normal. No radio-opaque calculi. Pelvic phleboliths. No radiopaque foreign body. IMPRESSION: No radiopaque foreign body. Electronically Signed   By: Maudry Mayhew MD   On: 11/19/2020 23:28   MR KNEE LEFT W WO CONTRAST  Result Date: 11/20/2020 CLINICAL DATA:  Open wound along the knee, possible osteomyelitis. Prior BKA 08/05/2020 EXAM: MRI OF THE LEFT KNEE WITHOUT AND WITH CONTRAST TECHNIQUE: Multiplanar, multisequence MR imaging of the left knee was performed both before and after administration of intravenous contrast. Osteomyelitis protocol was utilized rather  than the orthopedic knee protocol, partially because of the severe motion artifact and partially because the main clinical concern is for infection. CONTRAST:  60mL GADAVIST GADOBUTROL 1 MMOL/ML IV SOLN COMPARISON:  Knee radiographs 11/19/2020 FINDINGS: Despite efforts by the technologist and patient, motion artifact is present on today's exam and could not be eliminated. This reduces exam sensitivity and specificity. Bones/Joint/Cartilage Prior amputation at the level of the proximal tibial shaft. Small chronic bone infarct in the proximal tibial metaphysis. Along the distal bony margin of the tibia, there is a somewhat irregular 0.8 by 2.5 cm region of edema and enhancement suspicious for a small amount of osteomyelitis. Adjacent periosteal reaction noted. This is somewhat more edema and enhancement than I would otherwise expect 4 months after amputation. No knee joint effusion. Ligaments N/A Muscles and Tendons Substantial in generalized edema in the musculature of the calf. No definite intramuscular abscess in the visualized tissues. Soft tissues Generalized subcutaneous edema along the distal stump. Due partially to patient motion, not all of the soft tissues of the distal stump are included on imaging. Subcutaneous enhancement favoring cellulitis. IMPRESSION: 1. Greater than expected localized edema and enhancement along the distal margin of the tibial amputation site for 4 months post amputation, suspicious for low-grade osteomyelitis along the distal bony margin. 2. Subcutaneous and intramuscular edema in the calf with subcutaneous enhancement characteristic of cellulitis and likely myositis. No drainable abscess is identified. 3. Incidental small bone infarct in the proximal tibia, chronic. Electronically Signed   By: Gaylyn Rong M.D.   On: 11/20/2020 05:14     Subjective: No issues overnight.  Discharge Exam: Vitals:   11/25/20 2336 11/26/20 0428  BP: 111/62 116/79  Pulse: 68 99  Resp: 13  14  Temp: 98.3 F (36.8 C) 97.9 F (36.6 C)  SpO2: 95% 98%   Vitals:   11/25/20 1558 11/25/20 2008 11/25/20 2336 11/26/20 0428  BP: 112/76 108/66 111/62 116/79  Pulse: 78 82 68 99  Resp: 18 12 13 14   Temp: 98.6 F (37 C) 98.3 F (36.8 C) 98.3 F (36.8 C) 97.9 F (36.6 C)  TempSrc: Oral Oral Oral Oral  SpO2: 98% 98% 95% 98%  Weight:      Height:        General: Pt is alert, awake, not in acute distress Cardiovascular: RRR, S1/S2 +, no rubs, no gallops Respiratory: CTA bilaterally, no wheezing, no rhonchi Abdominal: Soft, NT, ND, bowel sounds + Extremities: no edema, no cyanosis  The results of significant diagnostics from this hospitalization (including imaging, microbiology, ancillary and laboratory) are listed below for reference.     Microbiology: Recent Results (from the past 240 hour(s))  Blood Culture (routine x 2)     Status: Abnormal   Collection Time: 11/19/20 10:38 AM   Specimen: BLOOD RIGHT ARM  Result Value Ref Range Status   Specimen Description BLOOD RIGHT ARM  Final   Special Requests   Final    BOTTLES DRAWN AEROBIC AND ANAEROBIC Blood Culture adequate volume   Culture  Setup Time   Final    GRAM NEGATIVE RODS ANAEROBIC BOTTLE ONLY CRITICAL RESULT CALLED TO, READ BACK BY AND VERIFIED WITH: PHARMD J LEDFORD AT 0141 11/22/2020 BY L BENFIELD    Culture (A)  Final    FUSOBACTERIUM NUCLEATUM BETA LACTAMASE POSITIVE Performed at [facility], [address], [place], [place] [address]    Report Status 11/24/2020 FINAL  Final  Blood Culture ID Panel (Reflexed)     Status: None   Collection Time: 11/19/20 10:38 AM  Result Value Ref Range Status   Enterococcus faecalis NOT DETECTED NOT DETECTED Final   Enterococcus Faecium NOT DETECTED NOT DETECTED Final   Listeria monocytogenes NOT DETECTED NOT DETECTED Final   Staphylococcus species NOT DETECTED NOT DETECTED Final   Staphylococcus aureus (BCID) NOT DETECTED NOT DETECTED Final    Staphylococcus epidermidis NOT DETECTED NOT DETECTED Final   Staphylococcus lugdunensis NOT DETECTED NOT DETECTED Final   Streptococcus species NOT DETECTED NOT DETECTED Final   Streptococcus agalactiae NOT DETECTED NOT DETECTED Final   Streptococcus pneumoniae NOT DETECTED NOT DETECTED Final   Streptococcus pyogenes NOT DETECTED NOT DETECTED Final   A.calcoaceticus-baumannii NOT DETECTED NOT DETECTED Final   Bacteroides fragilis NOT DETECTED NOT DETECTED Final   Enterobacterales NOT DETECTED NOT DETECTED Final   Enterobacter cloacae complex NOT DETECTED NOT DETECTED Final   Escherichia coli NOT DETECTED NOT DETECTED Final   Klebsiella aerogenes NOT DETECTED NOT DETECTED Final   Klebsiella oxytoca NOT DETECTED NOT DETECTED Final   Klebsiella pneumoniae NOT DETECTED NOT DETECTED Final   Proteus species NOT DETECTED NOT DETECTED Final   Salmonella species NOT DETECTED NOT DETECTED Final   Serratia marcescens NOT DETECTED NOT DETECTED Final   Haemophilus influenzae NOT DETECTED NOT DETECTED Final   Neisseria meningitidis NOT DETECTED NOT DETECTED Final   Pseudomonas aeruginosa NOT DETECTED NOT DETECTED Final   Stenotrophomonas maltophilia NOT DETECTED NOT DETECTED Final   Candida albicans NOT DETECTED NOT DETECTED Final   Candida auris NOT DETECTED NOT DETECTED Final   Candida glabrata NOT DETECTED NOT DETECTED Final   Candida krusei NOT DETECTED NOT DETECTED Final   Candida parapsilosis NOT DETECTED NOT DETECTED Final   Candida tropicalis NOT DETECTED NOT DETECTED Final   Cryptococcus neoformans/gattii NOT DETECTED NOT DETECTED Final    Comment: Performed at [facility], [address] [place], [place] [address]  Resp Panel by RT-PCR (Flu A&B, Covid) Nasopharyngeal Swab     Status: None   Collection Time: 11/19/20  6:16 PM   Specimen: Nasopharyngeal Swab; Nasopharyngeal(NP) swabs in vial transport medium  Result Value Ref Range Status   SARS Coronavirus 2 by RT PCR NEGATIVE  NEGATIVE Final    Comment: (NOTE) SARS-CoV-2 target nucleic acids are NOT DETECTED.  The SARS-CoV-2 RNA is generally detectable in upper respiratory specimens during the acute phase of infection. The lowest concentration of SARS-CoV-2 viral copies this assay can detect is 138 copies/mL.  A negative result does not preclude SARS-Cov-2 infection and should not be used as the sole basis for treatment or other patient management decisions. A negative result may occur with  improper specimen collection/handling, submission of specimen other than nasopharyngeal swab, presence of viral mutation(s) within the areas targeted by this assay, and inadequate number of viral copies(<138 copies/mL). A negative result must be combined with clinical observations, patient history, and epidemiological information. The expected result is Negative.  Fact Sheet for Patients:  BloggerCourse.comhttps://www.fda.gov/media/152166/download  Fact Sheet for Healthcare Providers:  SeriousBroker.ithttps://www.fda.gov/media/152162/download  This test is no t yet approved or cleared by the [place] FDA and  has been authorized for detection and/or diagnosis of SARS-CoV-2 by FDA under an Emergency Use Authorization (EUA). This EUA will remain  in effect (meaning this test can be used) for the duration of the COVID-19 declaration under Section 564(b)(1) of the Act, 21 U.S.C.section 360bbb-3(b)(1), unless the authorization is terminated  or revoked sooner.       Influenza A by PCR NEGATIVE NEGATIVE Final   Influenza B by PCR NEGATIVE NEGATIVE Final    Comment: (NOTE) The Xpert Xpress SARS-CoV-2/FLU/RSV plus assay is intended as an aid in the diagnosis of influenza from Nasopharyngeal swab specimens and should not be used as a sole basis for treatment. Nasal washings and aspirates are unacceptable for Xpert Xpress SARS-CoV-2/FLU/RSV testing.  Fact Sheet for Patients: BloggerCourse.comhttps://www.fda.gov/media/152166/download  Fact Sheet for Healthcare  Providers: SeriousBroker.ithttps://www.fda.gov/media/152162/download  This test is not yet approved or cleared by the [place] FDA and has been authorized for detection and/or diagnosis of SARS-CoV-2 by FDA under an Emergency Use Authorization (EUA). This EUA will remain in effect (meaning this test can be used) for the duration of the COVID-19 declaration under Section 564(b)(1) of the Act, 21 U.S.C. section 360bbb-3(b)(1), unless the authorization is terminated or revoked.  Performed at [facility], [address] [place], [place] [address]   Blood Culture (routine x 2)     Status: None   Collection Time: 11/19/20 10:35 PM   Specimen: BLOOD  Result Value Ref Range Status   Specimen Description BLOOD RIGHT HAND  Final   Special Requests   Final    BOTTLES DRAWN AEROBIC AND ANAEROBIC Blood Culture results may not be optimal due to an inadequate volume of blood received in culture bottles   Culture   Final    NO GROWTH 5 DAYS Performed at [facility], [address] [place], [place] [address]    Report Status 11/25/2020 FINAL  Final  Urine Culture     Status: Abnormal   Collection Time: 11/20/20 12:30 AM   Specimen: In/Out Cath Urine  Result Value Ref Range Status   Specimen Description IN/OUT CATH URINE  Final   Special Requests   Final    NONE Performed at [facility], [address] [place], [place] [address]    Culture MULTIPLE SPECIES PRESENT, SUGGEST RECOLLECTION (A)  Final   Report Status 11/21/2020 FINAL  Final  Surgical pcr screen     Status: Abnormal   Collection Time: 11/20/20 10:49 PM   Specimen: Nasal Mucosa; Nasal Swab  Result Value Ref Range Status   MRSA, PCR NEGATIVE NEGATIVE Final   Staphylococcus aureus POSITIVE (A) NEGATIVE Final    Comment: (NOTE) The Xpert SA Assay (FDA approved for NASAL specimens in patients 222 years of age and older), is one component of a comprehensive surveillance program. It is not intended to diagnose infection  nor to guide or monitor treatment. Performed at [facility], [address] [place], [place] [address]   SARS CORONAVIRUS 2 (TAT 6-24 HRS) Nasopharyngeal Nasopharyngeal Swab     Status: None   Collection Time: 11/25/20 10:26 AM   Specimen: Nasopharyngeal Swab  Result Value Ref Range Status   SARS Coronavirus 2 NEGATIVE NEGATIVE Final    Comment: (NOTE) SARS-CoV-2 target nucleic acids are NOT DETECTED.  The SARS-CoV-2 RNA is generally detectable in upper and lower respiratory specimens during the acute phase of infection. Negative results do not preclude SARS-CoV-2 infection, do not rule out co-infections with other pathogens, and should not be used as the sole basis for treatment or other patient management decisions. Negative results must be combined with clinical observations, patient history, and epidemiological information. The expected result is Negative.  Fact Sheet for Patients: HairSlick.no  Fact Sheet for Healthcare Providers: quierodirigir.com  This test is not yet approved or cleared by the [place] FDA and  has been authorized for detection and/or diagnosis of SARS-CoV-2 by FDA under an Emergency Use Authorization (EUA). This EUA will remain  in effect (meaning this test can be used) for the duration of the COVID-19 declaration under Se ction 564(b)(1) of the Act, 21 U.S.C. section 360bbb-3(b)(1), unless the authorization is terminated or revoked sooner.  Performed at [facility] [place] [facility], [address], [place], [place] [address]      Labs: BNP (last 3 results) No results for input(s): BNP in the last 8760 hours. Basic Metabolic Panel: Recent Labs  Lab 11/19/20 2241 11/20/20 0500 11/21/20 0227 11/22/20 0050 11/23/20 0118 11/24/20 0108 11/24/20 1813 11/25/20 0118 11/26/20 0248  NA  --    < > 131* 131* 132* 134*  --  133*  --   K  --    < > 3.0* 2.8* 2.8* 3.4*  --  3.3* 3.7  CL  --     < > 97* 96* 98 101  --  100  --   CO2  --    < > --  27  --   GLUCOSE  --    < > 102* 114* 107* 132*  --  104*  --   BUN  --    < > <5* <5* <5* <5*  --  <5*  --   CREATININE  --    < > 0.52* 0.52* 0.53* 0.53*  --  0.47*  --   CALCIUM  --    < > 8.3* 8.0* 8.2* 8.1*  --  8.4*  --   MG 1.8  --   --   --   --   --  1.7  --   --   PHOS 3.3  --   --   --   --   --  3.4  --   --    < > = values in this interval not displayed.   Liver Function Tests: No results for input(s): AST, ALT, ALKPHOS, BILITOT, PROT, ALBUMIN in the last 168 hours. No results for input(s): LIPASE, AMYLASE in the last 168 hours. No results for input(s): AMMONIA in the last 168 hours. CBC: Recent Labs  Lab 11/22/20 0050 11/23/20 0118 11/24/20 0108 11/24/20 1813 11/25/20 0118  WBC 9.5 11.0* 10.0 10.2 8.8  HGB 10.9* 10.2* 9.9* 10.5* 9.9*  HCT 32.6* 30.0* 29.7* 32.1* 29.7*  MCV 83.4 82.6 84.1 85.4 84.1  PLT 154 177 188 240 240   Cardiac  Enzymes: No results for input(s): CKTOTAL, CKMB, CKMBINDEX, TROPONINI in the last 168 hours. BNP: Invalid input(s): POCBNP CBG: No results for input(s): GLUCAP in the last 168 hours. D-Dimer No results for input(s): DDIMER in the last 72 hours. Hgb A1c No results for input(s): HGBA1C in the last 72 hours. Lipid Profile No results for input(s): CHOL, HDL, LDLCALC, TRIG, CHOLHDL, LDLDIRECT in the last 72 hours. Thyroid function studies No results for input(s): TSH, T4TOTAL, T3FREE, THYROIDAB in the last 72 hours.  Invalid input(s): FREET3 Anemia work up No results for input(s): VITAMINB12, FOLATE, FERRITIN, TIBC, IRON, RETICCTPCT in the last 72 hours. Urinalysis    Component Value Date/Time   COLORURINE STRAW (A) 11/20/2020 0030   APPEARANCEUR CLEAR 11/20/2020 0030   LABSPEC 1.011 11/20/2020 0030   PHURINE 8.0 11/20/2020 0030   GLUCOSEU NEGATIVE 11/20/2020 0030   HGBUR NEGATIVE 11/20/2020 0030   BILIRUBINUR NEGATIVE 11/20/2020 0030   KETONESUR NEGATIVE  11/20/2020 0030   PROTEINUR NEGATIVE 11/20/2020 0030   UROBILINOGEN 0.2 08/02/2009 2144   NITRITE NEGATIVE 11/20/2020 0030   LEUKOCYTESUR NEGATIVE 11/20/2020 0030   Sepsis Labs Invalid input(s): PROCALCITONIN,  WBC,  LACTICIDVEN Microbiology Recent Results (from the past 240 hour(s))  Blood Culture (routine x 2)     Status: Abnormal   Collection Time: 11/19/20 10:38 AM   Specimen: BLOOD RIGHT ARM  Result Value Ref Range Status   Specimen Description BLOOD RIGHT ARM  Final   Special Requests   Final    BOTTLES DRAWN AEROBIC AND ANAEROBIC Blood Culture adequate volume   Culture  Setup Time   Final    GRAM NEGATIVE RODS ANAEROBIC BOTTLE ONLY CRITICAL RESULT CALLED TO, READ BACK BY AND VERIFIED WITH: PHARMD J LEDFORD AT 0141 11/22/2020 BY L BENFIELD    Culture (A)  Final    FUSOBACTERIUM NUCLEATUM BETA LACTAMASE POSITIVE Performed at [facility], [address], [place], [place] [address]    Report Status 11/24/2020 FINAL  Final  Blood Culture ID Panel (Reflexed)     Status: None   Collection Time: 11/19/20 10:38 AM  Result Value Ref Range Status   Enterococcus faecalis NOT DETECTED NOT DETECTED Final   Enterococcus Faecium NOT DETECTED NOT DETECTED Final   Listeria monocytogenes NOT DETECTED NOT DETECTED Final   Staphylococcus species NOT DETECTED NOT DETECTED Final   Staphylococcus aureus (BCID) NOT DETECTED NOT DETECTED Final   Staphylococcus epidermidis NOT DETECTED NOT DETECTED Final   Staphylococcus lugdunensis NOT DETECTED NOT DETECTED Final   Streptococcus species NOT DETECTED NOT DETECTED Final   Streptococcus agalactiae NOT DETECTED NOT DETECTED Final   Streptococcus pneumoniae NOT DETECTED NOT DETECTED Final   Streptococcus pyogenes NOT DETECTED NOT DETECTED Final   A.calcoaceticus-baumannii NOT DETECTED NOT DETECTED Final   Bacteroides fragilis NOT DETECTED NOT DETECTED Final   Enterobacterales NOT DETECTED NOT DETECTED Final   Enterobacter cloacae complex  NOT DETECTED NOT DETECTED Final   Escherichia coli NOT DETECTED NOT DETECTED Final   Klebsiella aerogenes NOT DETECTED NOT DETECTED Final   Klebsiella oxytoca NOT DETECTED NOT DETECTED Final   Klebsiella pneumoniae NOT DETECTED NOT DETECTED Final   Proteus species NOT DETECTED NOT DETECTED Final   Salmonella species NOT DETECTED NOT DETECTED Final   Serratia marcescens NOT DETECTED NOT DETECTED Final   Haemophilus influenzae NOT DETECTED NOT DETECTED Final   Neisseria meningitidis NOT DETECTED NOT DETECTED Final   Pseudomonas aeruginosa NOT DETECTED NOT DETECTED Final   Stenotrophomonas maltophilia NOT DETECTED NOT DETECTED Final  Candida albicans NOT DETECTED NOT DETECTED Final   Candida auris NOT DETECTED NOT DETECTED Final   Candida glabrata NOT DETECTED NOT DETECTED Final   Candida krusei NOT DETECTED NOT DETECTED Final   Candida parapsilosis NOT DETECTED NOT DETECTED Final   Candida tropicalis NOT DETECTED NOT DETECTED Final   Cryptococcus neoformans/gattii NOT DETECTED NOT DETECTED Final    Comment: Performed at [facility], [address] [place], [place] [address]  Resp Panel by RT-PCR (Flu A&B, Covid) Nasopharyngeal Swab     Status: None   Collection Time: 11/19/20  6:16 PM   Specimen: Nasopharyngeal Swab; Nasopharyngeal(NP) swabs in vial transport medium  Result Value Ref Range Status   SARS Coronavirus 2 by RT PCR NEGATIVE NEGATIVE Final    Comment: (NOTE) SARS-CoV-2 target nucleic acids are NOT DETECTED.  The SARS-CoV-2 RNA is generally detectable in upper respiratory specimens during the acute phase of infection. The lowest concentration of SARS-CoV-2 viral copies this assay can detect is 138 copies/mL. A negative result does not preclude SARS-Cov-2 infection and should not be used as the sole basis for treatment or other patient management decisions. A negative result may occur with  improper specimen collection/handling, submission of specimen other than  nasopharyngeal swab, presence of viral mutation(s) within the areas targeted by this assay, and inadequate number of viral copies(<138 copies/mL). A negative result must be combined with clinical observations, patient history, and epidemiological information. The expected result is Negative.  Fact Sheet for Patients:  BloggerCourse.com  Fact Sheet for Healthcare Providers:  SeriousBroker.it  This test is no t yet approved or cleared by the [place] FDA and  has been authorized for detection and/or diagnosis of SARS-CoV-2 by FDA under an Emergency Use Authorization (EUA). This EUA will remain  in effect (meaning this test can be used) for the duration of the COVID-19 declaration under Section 564(b)(1) of the Act, 21 U.S.C.section 360bbb-3(b)(1), unless the authorization is terminated  or revoked sooner.       Influenza A by PCR NEGATIVE NEGATIVE Final   Influenza B by PCR NEGATIVE NEGATIVE Final    Comment: (NOTE) The Xpert Xpress SARS-CoV-2/FLU/RSV plus assay is intended as an aid in the diagnosis of influenza from Nasopharyngeal swab specimens and should not be used as a sole basis for treatment. Nasal washings and aspirates are unacceptable for Xpert Xpress SARS-CoV-2/FLU/RSV testing.  Fact Sheet for Patients: BloggerCourse.com  Fact Sheet for Healthcare Providers: SeriousBroker.it  This test is not yet approved or cleared by the [place] FDA and has been authorized for detection and/or diagnosis of SARS-CoV-2 by FDA under an Emergency Use Authorization (EUA). This EUA will remain in effect (meaning this test can be used) for the duration of the COVID-19 declaration under Section 564(b)(1) of the Act, 21 U.S.C. section 360bbb-3(b)(1), unless the authorization is terminated or revoked.  Performed at [facility], [address], [place], [place] [address]    Blood Culture (routine x 2)     Status: None   Collection Time: 11/19/20 10:35 PM   Specimen: BLOOD  Result Value Ref Range Status   Specimen Description BLOOD RIGHT HAND  Final   Special Requests   Final    BOTTLES DRAWN AEROBIC AND ANAEROBIC Blood Culture results may not be optimal due to an inadequate volume of blood received in culture bottles   Culture   Final    NO GROWTH 5 DAYS Performed at [facility], [address] [place],  [place] [address]    Report Status 11/25/2020 FINAL  Final  Urine Culture     Status: Abnormal   Collection Time: 11/20/20 12:30 AM   Specimen: In/Out Cath Urine  Result Value Ref Range Status   Specimen Description IN/OUT CATH URINE  Final   Special Requests   Final    NONE Performed at [facility], [address] [place], [place] [address]    Culture MULTIPLE SPECIES PRESENT, SUGGEST RECOLLECTION (A)  Final   Report Status 11/21/2020 FINAL  Final  Surgical pcr screen     Status: Abnormal   Collection Time: 11/20/20 10:49 PM   Specimen: Nasal Mucosa; Nasal Swab  Result Value Ref Range Status   MRSA, PCR NEGATIVE NEGATIVE Final   Staphylococcus aureus POSITIVE (A) NEGATIVE Final    Comment: (NOTE) The Xpert SA Assay (FDA approved for NASAL specimens in patients 82 years of age and older), is one component of a comprehensive surveillance program. It is not intended to diagnose infection nor to guide or monitor treatment. Performed at [facility], [address] [place], [place] [address]   SARS CORONAVIRUS 2 (TAT 6-24 HRS) Nasopharyngeal Nasopharyngeal Swab     Status: None   Collection Time: 11/25/20 10:26 AM   Specimen: Nasopharyngeal Swab  Result Value Ref Range Status   SARS Coronavirus 2 NEGATIVE NEGATIVE Final    Comment: (NOTE) SARS-CoV-2 target nucleic acids are NOT DETECTED.  The SARS-CoV-2 RNA is generally detectable in upper and lower respiratory specimens during the acute phase of infection.  Negative results do not preclude SARS-CoV-2 infection, do not rule out co-infections with other pathogens, and should not be used as the sole basis for treatment or other patient management decisions. Negative results must be combined with clinical observations, patient history, and epidemiological information. The expected result is Negative.  Fact Sheet for Patients: HairSlick.no  Fact Sheet for Healthcare Providers: quierodirigir.com  This test is not yet approved or cleared by the [place] FDA and  has been authorized for detection and/or diagnosis of SARS-CoV-2 by FDA under an Emergency Use Authorization (EUA). This EUA will remain  in effect (meaning this test can be used) for the duration of the COVID-19 declaration under Se ction 564(b)(1) of the Act, 21 U.S.C. section 360bbb-3(b)(1), unless the authorization is terminated or revoked sooner.  Performed at [facility], [address] [place], [place] [address]      Time coordinating discharge: 35 minutes  SIGNED:   Jacquelin Hawking, MD [facility] 11/26/2020, 1:44 PM

## 2020-11-26 NOTE — Progress Notes (Addendum)
CSW completed med necessity and placed it in discharge packet on unit. CSW called [facility] for pickup at 2pm. Patient will go to room 130 at [facility]. The number to call for report is [phone].  Patient's IVC has expired and it will not be renewed.  RN aware of discharge plan.  Edwin Dada, MSW, LCSW Transitions of Care  Clinical Social Worker II [phone]

## 2020-11-27 ENCOUNTER — Telehealth: Payer: Self-pay

## 2020-11-27 DIAGNOSIS — A419 Sepsis, unspecified organism: Secondary | ICD-10-CM | POA: Diagnosis not present

## 2020-11-27 DIAGNOSIS — L309 Dermatitis, unspecified: Secondary | ICD-10-CM | POA: Diagnosis not present

## 2020-11-27 DIAGNOSIS — R5381 Other malaise: Secondary | ICD-10-CM | POA: Diagnosis not present

## 2020-11-27 DIAGNOSIS — E7800 Pure hypercholesterolemia, unspecified: Secondary | ICD-10-CM | POA: Diagnosis not present

## 2020-11-27 DIAGNOSIS — I739 Peripheral vascular disease, unspecified: Secondary | ICD-10-CM | POA: Diagnosis not present

## 2020-11-27 NOTE — Telephone Encounter (Signed)
Transition Care Management Unsuccessful Follow-up Telephone Call  Date of discharge and from where:  11/26/2020-[facility]   Attempts:  1st Attempt  Reason for unsuccessful TCM follow-up call:  Unable to reach patient

## 2020-11-28 DIAGNOSIS — I10 Essential (primary) hypertension: Secondary | ICD-10-CM | POA: Diagnosis not present

## 2020-11-28 DIAGNOSIS — E559 Vitamin D deficiency, unspecified: Secondary | ICD-10-CM | POA: Diagnosis not present

## 2020-11-28 DIAGNOSIS — D510 Vitamin B12 deficiency anemia due to intrinsic factor deficiency: Secondary | ICD-10-CM | POA: Diagnosis not present

## 2020-11-30 NOTE — Telephone Encounter (Signed)
Transition Care Management Unsuccessful Follow-up Telephone Call  Date of discharge and from where:  11/26/2020-[facility]   Attempts:  2nd Attempt  Reason for unsuccessful TCM follow-up call:  Unable to reach patient

## 2020-12-01 DIAGNOSIS — F10139 Alcohol abuse with withdrawal, unspecified: Secondary | ICD-10-CM | POA: Diagnosis not present

## 2020-12-01 DIAGNOSIS — I10 Essential (primary) hypertension: Secondary | ICD-10-CM | POA: Diagnosis not present

## 2020-12-01 DIAGNOSIS — M869 Osteomyelitis, unspecified: Secondary | ICD-10-CM | POA: Diagnosis not present

## 2020-12-01 DIAGNOSIS — E1169 Type 2 diabetes mellitus with other specified complication: Secondary | ICD-10-CM | POA: Diagnosis not present

## 2020-12-01 NOTE — Telephone Encounter (Signed)
Transition Care Management Unsuccessful Follow-up Telephone Call  Date of discharge and from where:  11/26/2020 from [facility]  Attempts:  3rd Attempt  Reason for unsuccessful TCM follow-up call:  Unable to reach patient

## 2020-12-06 DIAGNOSIS — G4709 Other insomnia: Secondary | ICD-10-CM | POA: Diagnosis not present

## 2020-12-09 DIAGNOSIS — G4709 Other insomnia: Secondary | ICD-10-CM | POA: Diagnosis not present

## 2020-12-09 DIAGNOSIS — L309 Dermatitis, unspecified: Secondary | ICD-10-CM | POA: Diagnosis not present

## 2020-12-09 DIAGNOSIS — R5381 Other malaise: Secondary | ICD-10-CM | POA: Diagnosis not present

## 2020-12-09 DIAGNOSIS — E7800 Pure hypercholesterolemia, unspecified: Secondary | ICD-10-CM | POA: Diagnosis not present

## 2020-12-09 DIAGNOSIS — I739 Peripheral vascular disease, unspecified: Secondary | ICD-10-CM | POA: Diagnosis not present

## 2021-03-19 ENCOUNTER — Other Ambulatory Visit: Payer: Self-pay

## 2021-03-19 ENCOUNTER — Emergency Department (HOSPITAL_COMMUNITY): Payer: Medicaid Other

## 2021-03-19 ENCOUNTER — Inpatient Hospital Stay (HOSPITAL_COMMUNITY)
Admission: EM | Admit: 2021-03-19 | Discharge: 2021-03-27 | DRG: 871 | Disposition: A | Discharge status: Skilled Nursing Facility | Payer: Medicaid Other | Attending: Internal Medicine | Admitting: Internal Medicine

## 2021-03-19 DIAGNOSIS — R627 Adult failure to thrive: Secondary | ICD-10-CM | POA: Diagnosis present

## 2021-03-19 DIAGNOSIS — L97419 Non-pressure chronic ulcer of right heel and midfoot with unspecified severity: Secondary | ICD-10-CM

## 2021-03-19 DIAGNOSIS — R6521 Severe sepsis with septic shock: Secondary | ICD-10-CM | POA: Diagnosis present

## 2021-03-19 DIAGNOSIS — R404 Transient alteration of awareness: Secondary | ICD-10-CM | POA: Diagnosis not present

## 2021-03-19 DIAGNOSIS — E43 Unspecified severe protein-calorie malnutrition: Secondary | ICD-10-CM | POA: Diagnosis present

## 2021-03-19 DIAGNOSIS — G9341 Metabolic encephalopathy: Secondary | ICD-10-CM | POA: Diagnosis present

## 2021-03-19 DIAGNOSIS — Z89612 Acquired absence of left leg above knee: Secondary | ICD-10-CM

## 2021-03-19 DIAGNOSIS — Z681 Body mass index (BMI) 19 or less, adult: Secondary | ICD-10-CM | POA: Diagnosis not present

## 2021-03-19 DIAGNOSIS — R9431 Abnormal electrocardiogram [ECG] [EKG]: Secondary | ICD-10-CM | POA: Diagnosis not present

## 2021-03-19 DIAGNOSIS — L89322 Pressure ulcer of left buttock, stage 2: Secondary | ICD-10-CM | POA: Diagnosis present

## 2021-03-19 DIAGNOSIS — L89312 Pressure ulcer of right buttock, stage 2: Secondary | ICD-10-CM | POA: Diagnosis present

## 2021-03-19 DIAGNOSIS — A419 Sepsis, unspecified organism: Principal | ICD-10-CM | POA: Diagnosis present

## 2021-03-19 DIAGNOSIS — Z9114 Patient's other noncompliance with medication regimen: Secondary | ICD-10-CM

## 2021-03-19 DIAGNOSIS — Y905 Blood alcohol level of 100-119 mg/100 ml: Secondary | ICD-10-CM | POA: Diagnosis present

## 2021-03-19 DIAGNOSIS — I739 Peripheral vascular disease, unspecified: Secondary | ICD-10-CM | POA: Diagnosis present

## 2021-03-19 DIAGNOSIS — L03115 Cellulitis of right lower limb: Secondary | ICD-10-CM | POA: Diagnosis present

## 2021-03-19 DIAGNOSIS — E876 Hypokalemia: Secondary | ICD-10-CM | POA: Diagnosis not present

## 2021-03-19 DIAGNOSIS — R456 Violent behavior: Secondary | ICD-10-CM | POA: Diagnosis not present

## 2021-03-19 DIAGNOSIS — F1020 Alcohol dependence, uncomplicated: Secondary | ICD-10-CM | POA: Diagnosis present

## 2021-03-19 DIAGNOSIS — Z5900 Homelessness unspecified: Secondary | ICD-10-CM

## 2021-03-19 DIAGNOSIS — E871 Hypo-osmolality and hyponatremia: Secondary | ICD-10-CM | POA: Diagnosis present

## 2021-03-19 DIAGNOSIS — Z20822 Contact with and (suspected) exposure to covid-19: Secondary | ICD-10-CM | POA: Diagnosis present

## 2021-03-19 DIAGNOSIS — L89152 Pressure ulcer of sacral region, stage 2: Secondary | ICD-10-CM | POA: Diagnosis present

## 2021-03-19 DIAGNOSIS — D649 Anemia, unspecified: Secondary | ICD-10-CM | POA: Diagnosis present

## 2021-03-19 DIAGNOSIS — F17210 Nicotine dependence, cigarettes, uncomplicated: Secondary | ICD-10-CM | POA: Diagnosis present

## 2021-03-19 DIAGNOSIS — Z79899 Other long term (current) drug therapy: Secondary | ICD-10-CM | POA: Diagnosis not present

## 2021-03-19 DIAGNOSIS — L97219 Non-pressure chronic ulcer of right calf with unspecified severity: Secondary | ICD-10-CM | POA: Diagnosis not present

## 2021-03-19 DIAGNOSIS — R001 Bradycardia, unspecified: Secondary | ICD-10-CM | POA: Diagnosis not present

## 2021-03-19 DIAGNOSIS — D6959 Other secondary thrombocytopenia: Secondary | ICD-10-CM | POA: Diagnosis present

## 2021-03-19 DIAGNOSIS — T68XXXA Hypothermia, initial encounter: Secondary | ICD-10-CM | POA: Diagnosis not present

## 2021-03-19 DIAGNOSIS — E785 Hyperlipidemia, unspecified: Secondary | ICD-10-CM | POA: Diagnosis present

## 2021-03-19 DIAGNOSIS — X31XXXA Exposure to excessive natural cold, initial encounter: Secondary | ICD-10-CM

## 2021-03-19 DIAGNOSIS — I70235 Atherosclerosis of native arteries of right leg with ulceration of other part of foot: Secondary | ICD-10-CM

## 2021-03-19 DIAGNOSIS — G4700 Insomnia, unspecified: Secondary | ICD-10-CM | POA: Diagnosis present

## 2021-03-19 DIAGNOSIS — E861 Hypovolemia: Secondary | ICD-10-CM | POA: Diagnosis present

## 2021-03-19 DIAGNOSIS — I10 Essential (primary) hypertension: Secondary | ICD-10-CM | POA: Diagnosis not present

## 2021-03-19 LAB — COMPREHENSIVE METABOLIC PANEL
ALT: 40 U/L (ref 0–44)
AST: 81 U/L — ABNORMAL HIGH (ref 15–41)
Albumin: 2.1 g/dL — ABNORMAL LOW (ref 3.5–5.0)
Alkaline Phosphatase: 113 U/L (ref 38–126)
Anion gap: 12 (ref 5–15)
BUN: <5 mg/dL — ABNORMAL LOW (ref 6–20)
CO2: 26 mmol/L (ref 22–32)
Calcium: 7.9 mg/dL — ABNORMAL LOW (ref 8.9–10.3)
Chloride: 88 mmol/L — ABNORMAL LOW (ref 98–111)
Creatinine, Ser: <0.3 mg/dL — ABNORMAL LOW (ref 0.61–1.24)
Glucose, Bld: 100 mg/dL — ABNORMAL HIGH (ref 70–99)
Potassium: 3.7 mmol/L (ref 3.5–5.1)
Sodium: 126 mmol/L — ABNORMAL LOW (ref 135–145)
Total Bilirubin: 0.6 mg/dL (ref 0.3–1.2)
Total Protein: 6.4 g/dL — ABNORMAL LOW (ref 6.5–8.1)

## 2021-03-19 LAB — CBC WITH DIFFERENTIAL/PLATELET
Abs Immature Granulocytes: 0 10*3/uL (ref 0.00–0.07)
Basophils Absolute: 0 10*3/uL (ref 0.0–0.1)
Basophils Relative: 0 %
Eosinophils Absolute: 0 10*3/uL (ref 0.0–0.5)
Eosinophils Relative: 0 %
HCT: 37.3 % — ABNORMAL LOW (ref 39.0–52.0)
Hemoglobin: 12.8 g/dL — ABNORMAL LOW (ref 13.0–17.0)
Immature Granulocytes: 0 %
Lymphocytes Relative: 18 %
Lymphs Abs: 0.6 10*3/uL — ABNORMAL LOW (ref 0.7–4.0)
MCH: 27.8 pg (ref 26.0–34.0)
MCHC: 34.3 g/dL (ref 30.0–36.0)
MCV: 81.1 fL (ref 80.0–100.0)
Monocytes Absolute: 0.2 10*3/uL (ref 0.1–1.0)
Monocytes Relative: 6 %
Neutro Abs: 2.6 10*3/uL (ref 1.7–7.7)
Neutrophils Relative %: 76 %
Platelets: 115 10*3/uL — ABNORMAL LOW (ref 150–400)
RBC: 4.6 MIL/uL (ref 4.22–5.81)
RDW: 13.8 % (ref 11.5–15.5)
WBC: 3.4 10*3/uL — ABNORMAL LOW (ref 4.0–10.5)
nRBC: 0.6 % — ABNORMAL HIGH (ref 0.0–0.2)

## 2021-03-19 LAB — GLUCOSE, CAPILLARY
Glucose-Capillary: 120 mg/dL — ABNORMAL HIGH (ref 70–99)
Glucose-Capillary: 62 mg/dL — ABNORMAL LOW (ref 70–99)

## 2021-03-19 LAB — AMMONIA: Ammonia: 44 umol/L — ABNORMAL HIGH (ref 9–35)

## 2021-03-19 LAB — RESP PANEL BY RT-PCR (FLU A&B, COVID) ARPGX2
Influenza A by PCR: NEGATIVE
Influenza B by PCR: NEGATIVE
SARS Coronavirus 2 by RT PCR: NEGATIVE

## 2021-03-19 LAB — TSH: TSH: 0.357 u[IU]/mL (ref 0.350–4.500)

## 2021-03-19 LAB — LACTIC ACID, PLASMA: Lactic Acid, Venous: 2 mmol/L (ref 0.5–1.9)

## 2021-03-19 LAB — PROTIME-INR
INR: 0.9 (ref 0.8–1.2)
Prothrombin Time: 12.2 seconds (ref 11.4–15.2)

## 2021-03-19 MED ORDER — VANCOMYCIN HCL 750 MG/150ML IV SOLN
750.0000 mg | Freq: Three times a day (TID) | INTRAVENOUS | Status: DC
  Administered 2021-03-20 – 2021-03-21 (×5): 750 mg via INTRAVENOUS
  Filled 2021-03-19 (×7): qty 150

## 2021-03-19 MED ORDER — LACTULOSE 10 GM/15ML PO SOLN
10.0000 g | Freq: Once | ORAL | Status: AC
  Administered 2021-03-19: 10 g via ORAL
  Filled 2021-03-19 (×2): qty 15

## 2021-03-19 MED ORDER — SODIUM CHLORIDE 0.9 % IV BOLUS (SEPSIS)
1000.0000 mL | Freq: Once | INTRAVENOUS | Status: AC
  Administered 2021-03-19: 1000 mL via INTRAVENOUS

## 2021-03-19 MED ORDER — LORAZEPAM 2 MG/ML IJ SOLN
1.0000 mg | INTRAMUSCULAR | Status: AC | PRN
  Administered 2021-03-20: 05:00:00 2 mg via INTRAVENOUS
  Administered 2021-03-20: 03:00:00 1 mg via INTRAVENOUS
  Filled 2021-03-19 (×2): qty 1

## 2021-03-19 MED ORDER — SODIUM CHLORIDE 0.9 % IV SOLN: INTRAVENOUS | Status: DC

## 2021-03-19 MED ORDER — ADULT MULTIVITAMIN W/MINERALS CH
1.0000 | ORAL_TABLET | Freq: Every day | ORAL | Status: DC
  Administered 2021-03-19 – 2021-03-27 (×9): 1 via ORAL
  Filled 2021-03-19 (×9): qty 1

## 2021-03-19 MED ORDER — SODIUM CHLORIDE 0.9 % IV SOLN
250.0000 mL | INTRAVENOUS | Status: DC
  Administered 2021-03-20: 250 mL via INTRAVENOUS

## 2021-03-19 MED ORDER — VANCOMYCIN HCL 1500 MG/300ML IV SOLN
1500.0000 mg | Freq: Once | INTRAVENOUS | Status: AC
  Administered 2021-03-19: 1500 mg via INTRAVENOUS
  Filled 2021-03-19: qty 300

## 2021-03-19 MED ORDER — METRONIDAZOLE 500 MG/100ML IV SOLN
500.0000 mg | Freq: Once | INTRAVENOUS | Status: AC
  Administered 2021-03-19: 500 mg via INTRAVENOUS
  Filled 2021-03-19: qty 100

## 2021-03-19 MED ORDER — SODIUM CHLORIDE 0.9 % IV SOLN
2.0000 g | Freq: Three times a day (TID) | INTRAVENOUS | Status: DC
  Administered 2021-03-20 – 2021-03-26 (×20): 2 g via INTRAVENOUS
  Filled 2021-03-19 (×20): qty 2

## 2021-03-19 MED ORDER — LACTATED RINGERS IV SOLN: INTRAVENOUS | Status: DC

## 2021-03-19 MED ORDER — THIAMINE HCL 100 MG/ML IJ SOLN
100.0000 mg | Freq: Every day | INTRAMUSCULAR | Status: DC
  Administered 2021-03-19: 23:00:00 100 mg via INTRAVENOUS
  Filled 2021-03-19 (×2): qty 2

## 2021-03-19 MED ORDER — NOREPINEPHRINE 4 MG/250ML-% IV SOLN
2.0000 ug/min | INTRAVENOUS | Status: DC
  Administered 2021-03-20 (×2): 8 ug/min via INTRAVENOUS
  Filled 2021-03-19 (×2): qty 250

## 2021-03-19 MED ORDER — HEPARIN SODIUM (PORCINE) 5000 UNIT/ML IJ SOLN
5000.0000 [IU] | Freq: Three times a day (TID) | INTRAMUSCULAR | Status: DC
  Administered 2021-03-19 – 2021-03-22 (×10): 5000 [IU] via SUBCUTANEOUS
  Filled 2021-03-19 (×10): qty 1

## 2021-03-19 MED ORDER — CHLORHEXIDINE GLUCONATE CLOTH 2 % EX PADS
6.0000 | MEDICATED_PAD | Freq: Every day | CUTANEOUS | Status: DC
  Administered 2021-03-19 – 2021-03-27 (×8): 6 via TOPICAL

## 2021-03-19 MED ORDER — VANCOMYCIN HCL 750 MG/150ML IV SOLN
750.0000 mg | Freq: Three times a day (TID) | INTRAVENOUS | Status: DC
  Filled 2021-03-19: qty 150

## 2021-03-19 MED ORDER — PANTOPRAZOLE SODIUM 40 MG IV SOLR
40.0000 mg | Freq: Every day | INTRAVENOUS | Status: DC
  Administered 2021-03-19 – 2021-03-24 (×6): 40 mg via INTRAVENOUS
  Filled 2021-03-19 (×6): qty 40

## 2021-03-19 MED ORDER — DEXTROSE 50 % IV SOLN
12.5000 g | INTRAVENOUS | Status: AC
  Administered 2021-03-19: 12.5 g via INTRAVENOUS

## 2021-03-19 MED ORDER — SODIUM CHLORIDE 0.9 % IV SOLN: 2.0000 g | Freq: Three times a day (TID) | INTRAVENOUS | Status: DC

## 2021-03-19 MED ORDER — DEXTROSE 50 % IV SOLN
INTRAVENOUS | Status: AC
  Filled 2021-03-19: qty 50

## 2021-03-19 MED ORDER — SODIUM CHLORIDE 0.9 % IV SOLN
2.0000 g | Freq: Once | INTRAVENOUS | Status: AC
  Administered 2021-03-19: 2 g via INTRAVENOUS
  Filled 2021-03-19: qty 2

## 2021-03-19 MED ORDER — THIAMINE HCL 100 MG PO TABS
100.0000 mg | ORAL_TABLET | Freq: Every day | ORAL | Status: DC
  Administered 2021-03-20 – 2021-03-27 (×8): 100 mg via ORAL
  Filled 2021-03-19 (×10): qty 1

## 2021-03-19 MED ORDER — SODIUM CHLORIDE 0.9 % IV BOLUS (SEPSIS)
250.0000 mL | Freq: Once | INTRAVENOUS | Status: AC
  Administered 2021-03-19: 250 mL via INTRAVENOUS

## 2021-03-19 MED ORDER — NOREPINEPHRINE 4 MG/250ML-% IV SOLN
0.0000 ug/min | INTRAVENOUS | Status: DC
  Administered 2021-03-19: 2 ug/min via INTRAVENOUS
  Filled 2021-03-19: qty 250

## 2021-03-19 MED ORDER — POLYETHYLENE GLYCOL 3350 17 G PO PACK: 17.0000 g | PACK | Freq: Every day | ORAL | Status: DC | PRN

## 2021-03-19 MED ORDER — FOLIC ACID 1 MG PO TABS
1.0000 mg | ORAL_TABLET | Freq: Every day | ORAL | Status: DC
  Administered 2021-03-19 – 2021-03-27 (×9): 1 mg via ORAL
  Filled 2021-03-19 (×9): qty 1

## 2021-03-19 MED ORDER — LORAZEPAM 1 MG PO TABS: 1.0000 mg | ORAL_TABLET | ORAL | Status: AC | PRN

## 2021-03-19 MED ORDER — DOCUSATE SODIUM 100 MG PO CAPS: 100.0000 mg | ORAL_CAPSULE | Freq: Two times a day (BID) | ORAL | Status: DC | PRN

## 2021-03-19 NOTE — ED Provider Notes (Signed)
[facility] EMERGENCY DEPARTMENT Provider Note   CSN: [id number] Arrival date & time: 03/19/21  2831     History No chief complaint on file.   Jake Nguyen is a 55 y.o. male.  The history is provided by the patient and medical records. The history is limited by the condition of the patient. No language interpreter was used.  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   Gram-negative bacteremia 11/24/2020   Severe sepsis without septic shock (HCC) 11/24/2020   Cellulitis of left leg 11/19/2020   Alcohol withdrawal syndrome (HCC) 11/19/2020   Hypokalemia 11/19/2020   Acute metabolic encephalopathy 11/14/2020   Sepsis (HCC) 11/14/2020   S/P BKA (below knee amputation) unilateral, left (HCC)    Neuropathic pain, leg, left    Gangrene (HCC) 07/31/2020   Gangrene of foot (HCC) 07/14/2020   Ataxia    Alcohol abuse with alcohol-induced mood disorder (HCC) 11/10/2019   Alcoholism /alcohol abuse    Alcohol withdrawal syndrome, with delirium (HCC) 11/06/2019   Pressure injury of skin 05/25/2018   Rhabdomyolysis 05/20/2018   Hyponatremia 05/20/2018   Elevated troponin 05/20/2018   Moderate dehydration 05/20/2018   Alcohol use 05/20/2018   Influenza A 05/20/2018   Transaminitis 05/20/2018    Past Surgical History:  Procedure Laterality Date   ABDOMINAL AORTOGRAM W/LOWER EXTREMITY N/A 08/04/2020   Procedure: ABDOMINAL AORTOGRAM W/LOWER EXTREMITY;  Surgeon: Nada Libman, MD;   Location: [facility] INVASIVE CV LAB;  Service: Cardiovascular;  Laterality: N/A;   AMPUTATION Left 08/05/2020   Procedure: AMPUTATION BELOW KNEE;  Surgeon: Nada Libman, MD;  Location: [facility] OR;  Service: Vascular;  Laterality: Left;   AMPUTATION Left 11/21/2020   Procedure: REVISION LEFT ABOVE KNEE AMPUTATION;  Surgeon: Nada Libman, MD;  Location: [facility] OR;  Service: Vascular;  Laterality: Left;       Family History  Family history unknown: Yes    Social History   Tobacco Use   Smoking status: Every Day    Packs/day: 0.50    Types: Cigarettes   Smokeless tobacco: Never  Substance Use Topics   Alcohol use: Yes    Comment: 6 pack perday   Drug use: Yes    Types: Marijuana    Home Medications Prior to Admission medications   Medication Sig Start Date End Date Taking? Authorizing Provider  atorvastatin (LIPITOR) 40 MG tablet Take 1 tablet (40 mg total) by mouth daily. Patient not taking: Reported on 11/19/2020 08/27/20   Russella Dar, NP  folic acid (FOLVITE) 1 MG tablet Take 1 tablet (1 mg total) by mouth daily. Patient not taking: Reported on 11/19/2020 08/27/20   Russella Dar, NP  Multiple Vitamin (MULTIVITAMIN WITH MINERALS) TABS tablet Take 1 tablet by mouth daily. 11/27/20   Narda Bonds, MD  oxyCODONE-acetaminophen (PERCOCET) 5-325 MG tablet Take 1 tablet by mouth every 4 (four) hours as needed for severe pain. 11/26/20   Narda Bonds, MD    Allergies  Pork-derived products  Review of Systems   Review of Systems  Unable to perform ROS: Mental status change  Constitutional:  Positive for chills and fatigue.  HENT:  Negative for congestion.   Respiratory:  Positive for cough. Negative for chest tightness, shortness of breath and wheezing.   Cardiovascular:  Negative for chest pain.  Gastrointestinal:  Negative for abdominal pain, constipation, diarrhea, nausea and vomiting.  Skin:  Positive for rash and wound.  Neurological:  Negative for headaches.   Psychiatric/Behavioral:  Positive for confusion. Negative for agitation.   All other systems reviewed and are negative.  Physical Exam Updated Vital Signs There were no vitals taken for this visit.  Physical Exam Vitals and nursing note reviewed.  Constitutional:      General: He is not in acute distress.    Appearance: He is well-developed. He is ill-appearing. He is not diaphoretic.  HENT:     Head: Normocephalic and atraumatic.     Nose: No congestion.     Mouth/Throat:     Mouth: Mucous membranes are dry.     Pharynx: No oropharyngeal exudate or posterior oropharyngeal erythema.  Eyes:     Extraocular Movements: Extraocular movements intact.     Conjunctiva/sclera: Conjunctivae normal.     Pupils: Pupils are equal, round, and reactive to light.  Cardiovascular:     Rate and Rhythm: Normal rate and regular rhythm.     Heart sounds: No murmur heard. Pulmonary:     Effort: Tachypnea present. No respiratory distress.     Breath sounds: Rhonchi present. No wheezing or rales.  Chest:     Chest wall: No tenderness.  Abdominal:     General: Abdomen is flat.     Palpations: Abdomen is soft.     Tenderness: There is no abdominal tenderness. There is no guarding or rebound.  Musculoskeletal:        General: Tenderness present. No swelling.     Cervical back: Neck supple. No tenderness.       Legs:     Comments: Patient has left BKA with staples still in place from August.  Right leg has significant infection from the mid shin down.  There is foul smell, purulence, and drainage.  Diffuse tenderness.  Patient can wiggle toes and I felt a DP pulse.  Skin:    General: Skin is warm and dry.     Capillary Refill: Capillary refill takes less than 2 seconds.     Findings: Erythema and rash present.  Neurological:     Sensory: No sensory deficit.     Motor: No weakness.        ED Results / Procedures / Treatments   Labs (all labs ordered are listed, but only abnormal  results are displayed) Labs Reviewed  COMPREHENSIVE METABOLIC PANEL - Abnormal; Notable for the following components:      Result Value   Sodium 126 (*)    Chloride 88 (*)    Glucose, Bld 100 (*)    BUN <5 (*)    Creatinine, Ser <0.30 (*)    Calcium 7.9 (*)    Total Protein 6.4 (*)    Albumin 2.1 (*)    AST 81 (*)    All other components within normal limits  LACTIC ACID, PLASMA - Abnormal; Notable for the following components:   Lactic Acid, Venous 2.0 (*)    All other components within normal limits  CBC WITH DIFFERENTIAL/PLATELET - Abnormal; Notable for the following components:   WBC  3.4 (*)    Hemoglobin 12.8 (*)    HCT 37.3 (*)    Platelets 115 (*)    nRBC 0.6 (*)    Lymphs Abs 0.6 (*)    All other components within normal limits  AMMONIA - Abnormal; Notable for the following components:   Ammonia 44 (*)    All other components within normal limits  RESP PANEL BY RT-PCR (FLU A&B, COVID) ARPGX2  CULTURE, BLOOD (ROUTINE X 2)  CULTURE, BLOOD (ROUTINE X 2)  URINE CULTURE  MRSA NEXT GEN BY PCR, NASAL  PROTIME-INR  TSH  LACTIC ACID, PLASMA  URINALYSIS, ROUTINE W REFLEX MICROSCOPIC  APTT  ETHANOL  RAPID URINE DRUG SCREEN, HOSP PERFORMED  CK  CORTISOL  PROCALCITONIN  CBC  CREATININE, SERUM  CBC  BASIC METABOLIC PANEL  MAGNESIUM  PHOSPHORUS  AMMONIA  PROCALCITONIN  SODIUM  SODIUM  OSMOLALITY    EKG EKG Interpretation  Date/Time:  Friday March 19 2021 20:10:56 EST Ventricular Rate:  85 PR Interval:  158 QRS Duration: 80 QT Interval:  454 QTC Calculation: 540 R Axis:   80 Text Interpretation: Normal sinus rhythm Low voltage QRS Septal infarct , age undetermined Abnormal ECG When compared to prior, longer QTC. No STEMI Confirmed by Theda Belfast ([id number]) on 03/19/2021 9:23:57 PM  Radiology DG Knee 2 Views Left  Result Date: 03/19/2021 CLINICAL DATA:  Sepsis EXAM: LEFT KNEE - 1-2 VIEW COMPARISON:  1122 FINDINGS: Frontal and lateral views of the distal  margin of an above knee amputation are identified. At the surgical stump of the distal left femur there is heterotopic ossification. I do not see any periosteal reaction or bony destruction to suggest osteomyelitis. There is diffuse soft tissue edema, with skin staples in place. IMPRESSION: 1. Left above knee amputation.  No evidence of osteomyelitis. 2. Diffuse soft tissue swelling. Electronically Signed   By: Sharlet Salina M.D.   On: 03/19/2021 18:13   DG Tibia/Fibula Right  Result Date: 03/19/2021 CLINICAL DATA:  Sepsis EXAM: RIGHT TIBIA AND FIBULA - 2 VIEW COMPARISON:  None. FINDINGS: Frontal and lateral views of the right tibia and fibula are obtained. There is diffuse subcutaneous edema. I do not see any evidence of subcutaneous gas. There are no acute or destructive bony lesions. Mild osteoarthritis of the right knee and ankle. IMPRESSION: 1. Diffuse soft tissue swelling. 2. No acute or destructive bony lesions. Electronically Signed   By: Sharlet Salina M.D.   On: 03/19/2021 18:10   CT HEAD WO CONTRAST ( )  Result Date: 03/19/2021 CLINICAL DATA:  Mental status change sepsis EXAM: CT HEAD WITHOUT CONTRAST TECHNIQUE: Contiguous axial images were obtained from the base of the skull through the vertex without intravenous contrast. COMPARISON:  CT brain 05/20/2018 FINDINGS: Brain: No acute territorial infarction, hemorrhage or intracranial mass. Mild atrophy. The ventricles are nonenlarged. Vascular: No hyperdense vessels.  Carotid vascular calcification Skull: Normal. Negative for fracture or focal lesion. Sinuses/Orbits: No acute finding. Other: None IMPRESSION: No CT evidence for acute intracranial abnormality. Electronically Signed   By: Jasmine Pang M.D.   On: 03/19/2021 18:41   DG Chest Port 1 View  Result Date: 03/19/2021 CLINICAL DATA:  Sepsis EXAM: PORTABLE CHEST 1 VIEW COMPARISON:  11/19/2020 FINDINGS: The heart size and mediastinal contours are within normal limits. Both lungs are  clear. The visualized skeletal structures are unremarkable. IMPRESSION: No active disease. Electronically Signed   By: Sharlet Salina M.D.   On: 03/19/2021 18:09   DG Foot Complete Right  Result Date: 03/19/2021 CLINICAL DATA:  Sepsis EXAM: RIGHT FOOT COMPLETE - 3+ VIEW COMPARISON:  05/26/2018 FINDINGS: Frontal, oblique, and lateral views of the right foot are obtained. There is diffuse soft tissue edema. There may be a soft tissue ulceration overlying the medial aspect of the right ankle. No evidence of subcutaneous gas. There are no acute or destructive bony lesions. There is mild osteoarthritis of the ankle, hindfoot, and midfoot. IMPRESSION: 1. Diffuse soft tissue swelling, with possible soft tissue ulceration medial aspect of the ankle. No subcutaneous gas. 2. Osteoarthritis.  No acute or destructive process. Electronically Signed   By: Sharlet Salina M.D.   On: 03/19/2021 18:11    Procedures Procedures   CRITICAL CARE Performed by: Canary Brim Omaira Mellen Total critical care time: 45 minutes Critical care time was exclusive of separately billable procedures and treating other patients. Critical care was necessary to treat or prevent imminent or life-threatening deterioration. Critical care was time spent personally by me on the following activities: development of treatment plan with patient and/or surrogate as well as nursing, discussions with consultants, evaluation of patient's response to treatment, examination of patient, obtaining history from patient or surrogate, ordering and performing treatments and interventions, ordering and review of laboratory studies, ordering and review of radiographic studies, pulse oximetry and re-evaluation of patient's condition.   Medications Ordered in ED Medications  vancomycin (VANCOREADY) IVPB 1500 mg/300 mL (1,500 mg Intravenous New Bag/Given 03/19/21 1933)  ceFEPIme (MAXIPIME) 2 g in sodium chloride 0.9 % 100 mL IVPB (has no administration in time  range)  vancomycin (VANCOREADY) IVPB 750 mg/150 mL (has no administration in time range)  LORazepam (ATIVAN) tablet 1-4 mg (has no administration in time range)    Or  LORazepam (ATIVAN) injection 1-4 mg (has no administration in time range)  thiamine tablet 100 mg (has no administration in time range)    Or  thiamine (B-1) injection 100 mg (has no administration in time range)  folic acid (FOLVITE) tablet 1 mg (has no administration in time range)  multivitamin with minerals tablet 1 tablet (has no administration in time range)  docusate sodium (COLACE) capsule 100 mg (has no administration in time range)  polyethylene glycol (MIRALAX / GLYCOLAX) packet 17 g (has no administration in time range)  heparin injection 5,000 Units (has no administration in time range)  lactulose (CHRONULAC) 10 GM/15ML solution 10 g (has no administration in time range)  0.9 %  sodium chloride infusion (has no administration in time range)  norepinephrine (LEVOPHED) 4mg  in (0.016 mg/mL) premix infusion (has no administration in time range)  pantoprazole (PROTONIX) injection 40 mg (has no administration in time range)  Chlorhexidine Gluconate Cloth 2 % PADS 6 each (has no administration in time range)  0.9 %  sodium chloride infusion (has no administration in time range)  ceFEPIme (MAXIPIME) 2 g in sodium chloride 0.9 % 100 mL IVPB (0 g Intravenous Stopped 03/19/21 1852)  metroNIDAZOLE (FLAGYL) IVPB 500 mg (0 mg Intravenous Stopped 03/19/21 1926)  sodium chloride 0.9 % bolus 1,000 mL (0 mLs Intravenous Stopped 03/19/21 1856)    And  sodium chloride 0.9 % bolus 1,000 mL (0 mLs Intravenous Stopped 03/19/21 1940)    And  sodium chloride 0.9 % bolus 250 mL (250 mLs Intravenous New Bag/Given 03/19/21 2034)    ED Course  I have reviewed the triage vital signs and the nursing notes.  Pertinent labs & imaging results that were available during my care of the patient were reviewed by  me and considered in my medical  decision making (see chart for details).    MDM Rules/Calculators/A&P                           Jake Nguyen is a 55 y.o. male with a past medical history significant for previous left BKA with revision in August, EtOH abuse, and reported homelessness in the chart who presents with altered mental status and concern for leg infection.  Patient was found by EMS confused and with obvious infection in the right leg.  He reportedly had several beers today and friends called 911 for more confusion.  Patient is unable to describe if he fell and is somnolent.  On arrival, patient's rectal temperature is very cold at 84 degrees.  This was confirmed multiple times.  Patient was not hypotensive but was tachypneic and on exam had clear infection in his right leg.  On further exam lungs were coarse and abdomen and chest nontender.  Patient has diffuse purulence and erythema and infection in the right leg just distal to the knee down to the toes.  He is able to move them and I did feel a DP pulse on the right leg.  Left leg has a stump with staples still in place with some tenderness and erythema present.  Patient is confused but pupils are symmetric and reactive.  No evidence of head trauma initially.  Due to altered mental status, head CT was obtained.  Patient made a code sepsis and blood pressure started to decline.  Patient was given his 30 cc/kg of fluid but he was still hypotensive so we started pressors.  Patient will be admitted to ICU for further management of sepsis likely due to significant leg infection needing pressor support.  X-rays did not show evidence of bony infection and medicine is going to speak with orthopedics about further management.  Patient admitted to ICU for further management of sepsis.   Final Clinical Impression(s) / ED Diagnoses Final diagnoses:  Cellulitis of right lower extremity  Sepsis, due to unspecified organism, unspecified whether acute organ dysfunction present  [facility])   Clinical Impression: 1. Cellulitis of right lower extremity   2. Sepsis, due to unspecified organism, unspecified whether acute organ dysfunction present [facility])     Disposition: Admit  This note was prepared with assistance of Dragon voice recognition software. Occasional wrong-word or sound-a-like substitutions may have occurred due to the inherent limitations of voice recognition software.     Sada Mazzoni, Canary Brim, MD 03/19/21 2127

## 2021-03-19 NOTE — Progress Notes (Signed)
eLink Physician-Brief Progress Note Patient Name: Jake Nguyen DOB: Mar 03, 1966 MRN: [id number]   Date of Service  03/19/2021  HPI/Events of Note   55 yr old homeless male Came in with altered MS and hypothermia (57F). HX of left AKA, ETOH abuse. Multiple leg wound on right. Septic shock. Received volume resuscitation and abx.  Needing vasopressor support. Ortho consulted. Acute on chronic hyponatremia also noted.   eICU Interventions  Chart reviewed. Resuscitation and rewarming ongoing     Intervention Category Evaluation Type: New Patient Evaluation  Henry Russel, P 03/19/2021, 11:17 PM

## 2021-03-19 NOTE — Progress Notes (Signed)
Pharmacy Antibiotic Note  Jake Nguyen is a 55 y.o. male admitted on 03/19/2021 with increased altered mental status, R leg necrotic, with concern for sepsis. Pharmacy has been consulted for vancomycin and cefepime dosing.  Pt slightly leukopenic, with low Scr likely 2/2 hx of amputation and overall deconditioning.  Plan: Vancomycin 1500 mg LD, followed by 750mg  IV q8h x7 days Cefepime 2g, followed by 2g IV q8hr x7 days  Monitor renal function, CBC, culture results.    Temp (24hrs), Avg:84.4 F (29.1 C), Min:84.4 F (29.1 C), Max:84.4 F (29.1 C)  Recent Labs  Lab 03/19/21 1704  WBC 3.4*    CrCl cannot be calculated (Patient's most recent lab result is older than the maximum 21 days allowed.).    Allergies  Allergen Reactions   Pork-Derived Products Other (See Comments)    Pt states he doesn't eat pork   Antimicrobials this admission: Vancomycin 12/9 >> Cefepime 12/9 >> Metronidazole 12/9 >>  Microbiology results: 12/9 BCx: In process  Thank you for allowing pharmacy to be a part of this patient's care.  Capers Hagmann 03/19/2021 6:09 PM

## 2021-03-19 NOTE — H&P (Addendum)
NAME:  Jake Nguyen, MRN:  [id number], DOB:  03/07/1966, LOS: 0 ADMISSION DATE:  03/19/2021, CONSULTATION DATE:  12/9 REFERRING MD:  Tegeler, CHIEF COMPLAINT:  AMS   History of Present Illness:  Patient is encephalopathic. Therefore history has been obtained from chart review.   Jake Nguyen, is a 55 y.o. male, who presented to the [facility] ED via [facility] with a AMS.  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 CTH negative for acute changes, no evidence of gas on RLE films. The patient was found to be hyponatremic NA 126, lactate 2.0. Code sepsis. The patient became hypotensive in the ED. 2L IVF given. BC obtained. Vanc/Cefepime/flagyl started.  PCCM was consulted for admission.  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 Head CT negative. Vanc/Cefepime/flagyl  Interim History / Subjective:  See above  Patient denies chest pain, SOB, denies pain in BLLE Objective   Blood pressure (!) 85/56, pulse 80, temperature (!) 88 F (31.1 C), temperature source Rectal, resp. rate 20, SpO2 100 %.       No intake or output data in the 24 hours ending 03/19/21 2015 There were no vitals filed for this visit.  Examination: General: In bed, NAD, appears comfortable, unwell appearing, unkempt  HEENT: MM pink/moist, anicteric, atraumatic, Lump on left jaw, soft, cool, nontender Neuro: RASS 0,  PERRL 60mm, GCS 15, MAE CV: S1S2, NSR, no m/r/g appreciated PULM:  clear in the upper lobes, clear in the lower lobes, trachea midline, chest expansion symmetric GI: soft, bsx4 active, non-tender   Extremities: cool/dry, L AKA, unable to assess pedal pulse due to wounds, +P/M/S  Skin: See photos of wounds, wounds cool            Resolved Hospital Problem list     Assessment & Plan:  Septic Shock Hypothermia RLE wounds- present on admission Lactic Acidosis Suspect secondary to RLE wounds with additional component of hypothermia. TSH WNL. Lactate 2.0. Received Vanc and cefepime in ED. 84 degrees on admission. BP documented 70/46. 100% RA. CXR with no infiltrate. HX osteomyelitis, fusobacterium nucleatum bacteremia. -Goal MAP 65 or greater. Levophed ordered. Titrate medication to goal -S/P 2L ml fluid resusitation. -Follow up BC and UC -On Cefepime and Vancomycin. Narrow as cultures result -Trend lactate -Follow procalcitonin, MRSA PCR -Obtain ECHO -Follow up CBC, CMP, Coags -Follow up CXR -Wound care consult -Ortho consult. Spoke with Dr. Marlou Sa. Will see patient.  -Rewarm with bair hugger, goal rise 1 degree and hour F, until grater than 50F. -Check 12 lead EKG  Addendum: Consulted VS. Dr. Trula Slade. Will see patient  Acute metabolic encephalopathy- Improving Suspect secondary to sepsis, hypothermia. CT head negative for acute process. Ammonia elevated- 44. GCS 15 on exam. Presented with AMS -Monitor neuro exam -Follow serum sodiums -Lactulose x1 -recheck AM ammonia level -Follow up UDS, ethanol level  Hyponatremia, acute on chronic NA 126 on admission, 133 in 08/22, chronic ETOH use, ?beer potomania.  -  Check serum osm, cortisol -No free H20 -Q4H sodium, recheck after 2L fluid volume resuscitation, Goal rise 4-33meq -Holding on hypertonic  ETOH abuse HX Alcohol withdrawal Reports drinking a 40 on 12/9, admissions with DT in past -Start CIWA  protocol -Thiamine -Folate  HX right AKA On 11/21/20 with vascular surgery. Staples remain in [place].  -Wound care consult.   Thrombocytopenia PLT 115. Suspect secondary to sepsis -monitor on AM CBC  Experiencing homlessness Failure to thrive Malnutrition- Unspecified Multiple admissions with lower extremity wound issues, issues with chronic staples, presented with RLE leg wounds. Albumin 2.1. -Social work consult -RD consult  HX PAD HX HLD Reports not taking any home medications -consider resumption of asa and lipitor in AM.   Best Practice (right click and "Reselect all SmartList Selections" daily)   Diet/type: NPO and NPO w/ oral meds DVT prophylaxis: prophylactic heparin  GI prophylaxis: PPI Lines: N/A Foley:  N/A Code Status:  full code Last date of multidisciplinary goals of care discussion [Pending]  Labs   CBC: Recent Labs  Lab 03/19/21 1704  WBC 3.4*  NEUTROABS 2.6  HGB 12.8*  HCT 37.3*  MCV 81.1  PLT 115*    Basic Metabolic Panel: Recent Labs  Lab 03/19/21 1704  NA 126*  K 3.7  CL 88*  CO2 26  GLUCOSE 100*  BUN <5*  CREATININE <0.30*  CALCIUM 7.9*   GFR: CrCl cannot be calculated (This lab value cannot be used to calculate CrCl because it is not a number: <0.30). Recent Labs  Lab 03/19/21 1651 03/19/21 1704  WBC  --  3.4*  LATICACIDVEN 2.0*  --     Liver Function Tests: Recent Labs  Lab 03/19/21 1704  AST 81*  ALT 40  ALKPHOS 113  BILITOT 0.6  PROT 6.4*  ALBUMIN 2.1*   No results for input(s): LIPASE, AMYLASE in the last 168 hours. Recent Labs  Lab 03/19/21 1732  AMMONIA 44*    ABG    Component Value Date/Time   TCO2 25 08/02/2009 2156     Coagulation Profile: Recent Labs  Lab 03/19/21 1704  INR 0.9    Cardiac Enzymes: No results for input(s): CKTOTAL, CKMB, CKMBINDEX, TROPONINI in the last 168 hours.  HbA1C: Hgb A1c MFr Bld  Date/Time Value Ref Range Status  07/31/2020 10:36 AM 6.6 (H) 4.8 - 5.6 %  Final    Comment:    (NOTE) Pre diabetes:          5.7%-6.4%  Diabetes:              >6.4%  Glycemic control for   <7.0% adults with diabetes   05/23/2018 09:52 AM 6.9 (H) 4.8 - 5.6 % Final    Comment:    (NOTE) Pre diabetes:          5.7%-6.4% Diabetes:              >6.4% Glycemic control for   <7.0% adults with diabetes     CBG: No results for input(s): GLUCAP in the last 168 hours.  Review of Systems:   ROS limited due to patient status  Positives in bold  Gen: fever, chills, weight change, fatigue, night sweats HEENT:  blurred vision, double vision, hearing loss, tinnitus, sinus congestion, rhinorrhea, sore throat, neck stiffness, dysphagia PULM:  shortness of breath, cough, sputum production, hemoptysis, wheezing CV: chest pain, edema, orthopnea, paroxysmal nocturnal dyspnea, palpitations GI:  abdominal pain, nausea, vomiting, diarrhea, hematochezia, melena, constipation, change in bowel habits GU: dysuria, hematuria,  polyuria, oliguria, urethral discharge Endocrine: hot or cold intolerance, polyuria, polyphagia or appetite change Derm: rash, dry skin, scaling or peeling skin change, leg wounds Heme: easy bruising, bleeding, bleeding gums Neuro: headache, numbness, weakness, slurred speech, loss of memory or consciousness   Past Medical History:  He,  has a past medical history of Alcoholism (HCC), Anxiety, and Depression.   Surgical History:   Past Surgical History:  Procedure Laterality Date   ABDOMINAL AORTOGRAM W/LOWER EXTREMITY N/A 08/04/2020   Procedure: ABDOMINAL AORTOGRAM W/LOWER EXTREMITY;  Surgeon: Nada Libman, MD;  Location: [facility] INVASIVE CV LAB;  Service: Cardiovascular;  Laterality: N/A;   AMPUTATION Left 08/05/2020   Procedure: AMPUTATION BELOW KNEE;  Surgeon: Nada Libman, MD;  Location: [facility] OR;  Service: Vascular;  Laterality: Left;   AMPUTATION Left 11/21/2020   Procedure: REVISION LEFT ABOVE KNEE AMPUTATION;  Surgeon: Nada Libman, MD;   Location: [facility] OR;  Service: Vascular;  Laterality: Left;     Social History:   reports that he has been smoking cigarettes. He has been smoking an average of .5 packs per day. He has never used smokeless tobacco. He reports current alcohol use. He reports current drug use. Drug: Marijuana.   Family History:  His Family history is unknown by patient.   Allergies Allergies  Allergen Reactions   Pork-Derived Products Other (See Comments)    Pt states he doesn't eat pork     Home Medications  Prior to Admission medications   Medication Sig Start Date End Date Taking? Authorizing Provider  atorvastatin (LIPITOR) 40 MG tablet Take 1 tablet (40 mg total) by mouth daily. Patient not taking: Reported on 11/19/2020 08/27/20   Russella Dar, NP  folic acid (FOLVITE) 1 MG tablet Take 1 tablet (1 mg total) by mouth daily. Patient not taking: Reported on 11/19/2020 08/27/20   Russella Dar, NP  Multiple Vitamin (MULTIVITAMIN WITH MINERALS) TABS tablet Take 1 tablet by mouth daily. Patient not taking: Reported on 03/19/2021 11/27/20   Narda Bonds, MD  oxyCODONE-acetaminophen (PERCOCET) 5-325 MG tablet Take 1 tablet by mouth every 4 (four) hours as needed for severe pain. Patient not taking: Reported on 03/19/2021 11/26/20   Narda Bonds, MD     Critical care time: 52 Minutes    Jake Nguyen Haywood Lasso., MSN, APRN, AGACNP-BC [facility]  03/19/2021 , 9:09 PM  Please see [email or web address] for pager details  If no response, please call [phone] After hours, please call Elink at [phone]

## 2021-03-19 NOTE — Consult Note (Signed)
[facility] [place]  Patient name: Jake Nguyen MRN: [id number] DOB: 09-13-1965 Sex: male   REQUESTING PROVIDER:   Critical care   REASON FOR CONSULT:    Sepsis with right lower extremity ulcers and infection  HISTORY OF PRESENT ILLNESS:   Jake Nguyen is a 55 y.o. male, who I have been asked to evaluate for his right leg being the source of his sepsis.  He is well-known to our service as he presented in August with ischemic changes to his left foot.  He underwent angiography that did not show reconstructable disease.  His foot was not salvageable.  He underwent left below-knee amputation.  This became infected with osteomyelitis and was converted to an above-knee amputation.  The patient presented to the emergency department today via EMS with altered mental status.  He is an alcoholic and homeless.  On arrival he was hypothermic with a temperature of 84 F.  He has multiple wounds on his right lower extremity.  A code sepsis was called.  I have been asked to evaluate his leg.   PAST MEDICAL HISTORY    Past Medical History:  Diagnosis Date   Alcoholism (HCC)    Anxiety    Depression      FAMILY HISTORY   Family History  Family history unknown: Yes    SOCIAL HISTORY:   Social History   Socioeconomic History   Marital status: Single    Spouse name: Not on file   Number of children: Not on file   Years of education: Not on file   Highest education level: Not on file  Occupational History   Not on file  Tobacco Use   Smoking status: Every Day    Packs/day: 0.50    Types: Cigarettes   Smokeless tobacco: Never  Substance and Sexual Activity   Alcohol use: Yes    Comment: 6 pack perday   Drug use: Yes    Types: Marijuana   Sexual activity: Not Currently  Other Topics Concern   Not on file  Social History Narrative   Not on file   Social Determinants of Health   Financial Resource Strain: Not  on file  Food Insecurity: Not on file  Transportation Needs: Not on file  Physical Activity: Not on file  Stress: Not on file  Social Connections: Not on file  Intimate Partner Violence: Not on file    ALLERGIES:    Allergies  Allergen Reactions   Pork-Derived Products Other (See Comments)    Pt states he doesn't eat pork    CURRENT MEDICATIONS:    Current Facility-Administered Medications  Medication Dose Route Frequency Provider Last Rate Last Admin   0.9 %  sodium chloride infusion  250 mL Intravenous Continuous Eliezer Champagne, NP       0.9 %  sodium chloride infusion   Intravenous Continuous Eliezer Champagne, NP       [START ON 03/20/2021] ceFEPIme (MAXIPIME) 2 g in sodium chloride 0.9 % 100 mL IVPB  2 g Intravenous Q8H Mancheril, Candis Schatz, RPH       Chlorhexidine Gluconate Cloth 2 % PADS 6 each  6 each Topical Daily Kirtland Bouchard, MD   6 each at 03/19/21 2312   docusate sodium (COLACE) capsule 100 mg  100 mg Oral BID PRN Eliezer Champagne, NP       folic acid (FOLVITE) tablet 1 mg  1 mg Oral Daily Eliezer Champagne, NP  heparin injection 5,000 Units  5,000 Units Subcutaneous Q8H Eliezer Champagne, NP       lactulose (CHRONULAC) 10 GM/15ML solution 10 g  10 g Oral Once Eliezer Champagne, NP       LORazepam (ATIVAN) tablet 1-4 mg  1-4 mg Oral Q1H PRN Eliezer Champagne, NP       Or   LORazepam (ATIVAN) injection 1-4 mg  1-4 mg Intravenous Q1H PRN Eliezer Champagne, NP       multivitamin with minerals tablet 1 tablet  1 tablet Oral Daily Eliezer Champagne, NP       norepinephrine (LEVOPHED) 4mg  in (0.016 mg/mL) premix infusion  2-10 mcg/min Intravenous Titrated , NP       pantoprazole (PROTONIX) injection 40 mg  40 mg Intravenous QHS Eliezer Champagne, NP   40 mg at 03/19/21 2310   polyethylene glycol (MIRALAX / GLYCOLAX) packet 17 g  17 g Oral Daily PRN 14/09/22, NP       thiamine tablet 100 mg  100 mg Oral Daily Eliezer Champagne, NP       Or   thiamine (B-1) injection 100 mg  100 mg Intravenous Daily Eliezer Champagne, NP   100 mg at 03/19/21 2310   [START ON 03/20/2021] vancomycin (VANCOREADY) IVPB 750 mg/150 mL  750 mg Intravenous Q8H Mancheril, 14/01/2021, RPH        REVIEW OF SYSTEMS:   [X]  denotes positive finding, [ ]  denotes negative finding Cardiac  Comments:  Chest pain or chest pressure:    Shortness of breath upon exertion:    Short of breath when lying flat:    Irregular heart rhythm:        Vascular    Pain in calf, thigh, or hip brought on by ambulation:    Pain in feet at night that wakes you up from your sleep:     Blood clot in your veins:    Leg swelling:         Pulmonary    Oxygen at home:    Productive cough:     Wheezing:         Neurologic    Sudden weakness in arms or legs:     Sudden numbness in arms or legs:     Sudden onset of difficulty speaking or slurred speech:    Temporary loss of vision in one eye:     Problems with dizziness:         Gastrointestinal    Blood in stool:      Vomited blood:         Genitourinary    Burning when urinating:     Blood in urine:        Psychiatric    Major depression:         Hematologic    Bleeding problems:    Problems with blood clotting too easily:        Skin    Rashes or ulcers:        Constitutional    Fever or chills:     PHYSICAL EXAM:   Vitals:   03/19/21 2100 03/19/21 2115 03/19/21 2130 03/19/21 2201  BP: 101/74 (!) 87/59 (!) 86/73   Pulse:  (!) 34 81   Resp: (!) 21 16 15    Temp:    (!) 92.4 F (33.6 C)  TempSrc:    Oral  SpO2:  (!) 76% (!) 81%  Weight:      Height:        GENERAL: The patient has a unkept appearance.  He is minimally communicative. CARDIAC: There is a regular rate and rhythm.  VASCULAR: Palpable right femoral pulse PULMONARY: Nonlabored respirations ABDOMEN: Soft and non-tender with normal pitched bowel sounds.  MUSCULOSKELETAL: Ulcerations noted throughout the right  lower extremity with edema and erythema consistent with cellulitis NEUROLOGIC: No focal weakness or paresthesias are detected. SKIN: There are no ulcers or rashes noted. PSYCHIATRIC: The patient has a normal affect.  STUDIES:   I have reviewed his CT of the head which shows no acute abnormalities.  ASSESSMENT and PLAN   Right leg ischemia: I suspect this is contributing to his overall septic picture.  At this point, I do not think the leg is viable.  I believe he is going to require an above-knee amputation.  However at this time I do not think he is an appropriate operative candidate and needs to undergo resuscitation with IV antibiotics.  At this point he would likely require a guillotine amputation because of the cellulitis and infection which extends up his leg.  Hopefully this will improve with resuscitation so that primary amputation can be performed with closure of the skin.  He will also need somebody to provide informed consent as at this point he cannot make his decisions.  He also needs to have the staples removed from his above-knee amputation on the left which was performed several months ago.  I will continue to follow the patient while he is in the hospital.   Durene Cal, IV, MD, FACS [facility] [phone] Pager [phone]

## 2021-03-19 NOTE — Progress Notes (Signed)
Patient's history and chart reviewed Right lower extremity with significant wounds and currently admitted for sepsis due to hypothermia  Underwent left BKA and subsequent AKA with staples still in place from surgery in August by vascular  Will give vascular right of first refusal for treatment of similar problem in the contralateral extremity which appears likely to have similar outcome.  If they decline we can assume care for the right lower extremity

## 2021-03-19 NOTE — ED Notes (Signed)
Pt resting of left side. Occasionally wakes to complain about how long treatment process is taking. Oriented to self, place, and situation. Bear hugger blanket still in place.

## 2021-03-19 NOTE — Sepsis Progress Note (Signed)
ELink monitoring sepsis protocol 

## 2021-03-19 NOTE — ED Triage Notes (Signed)
Arrives via [facility]. Homeless patient. Consumed 40oz EtOH today, friends called [phone] as pt appeared more altered than normal-friends poor historians for baseline mental status. R leg is necrotic. Left leg AKA.  EMS vitals 140/70 58HR

## 2021-03-19 NOTE — ED Notes (Signed)
Abrasion noted to right buttocks. Bleeding controlled. Wound cleaned with gauze and saline.

## 2021-03-19 NOTE — Progress Notes (Signed)
An USGPIV (ultrasound guided PIV) has been placed for short-term vasopressor infusion. Correctly placed ivWatch must be used when administering Vasopressors. Should this treatment be needed beyond 72 hours, central line access should be obtained.  It will be the responsibility of the bedside nurse to follow best practice to prevent extravasations.   

## 2021-03-19 NOTE — ED Notes (Signed)
Bear hugger blanket applied. MD Tegeler notified.  Pt cooperative and appreciative at this time. Resting comfortably.

## 2021-03-20 ENCOUNTER — Other Ambulatory Visit (HOSPITAL_COMMUNITY): Payer: Medicaid Other

## 2021-03-20 ENCOUNTER — Inpatient Hospital Stay (HOSPITAL_COMMUNITY): Payer: Medicaid Other

## 2021-03-20 DIAGNOSIS — A419 Sepsis, unspecified organism: Secondary | ICD-10-CM | POA: Diagnosis not present

## 2021-03-20 DIAGNOSIS — R6521 Severe sepsis with septic shock: Secondary | ICD-10-CM | POA: Diagnosis not present

## 2021-03-20 LAB — CBC
HCT: 31.5 % — ABNORMAL LOW (ref 39.0–52.0)
Hemoglobin: 10.9 g/dL — ABNORMAL LOW (ref 13.0–17.0)
MCH: 27.8 pg (ref 26.0–34.0)
MCHC: 34.6 g/dL (ref 30.0–36.0)
MCV: 80.4 fL (ref 80.0–100.0)
Platelets: 134 10*3/uL — ABNORMAL LOW (ref 150–400)
RBC: 3.92 MIL/uL — ABNORMAL LOW (ref 4.22–5.81)
RDW: 13.7 % (ref 11.5–15.5)
WBC: 3.8 10*3/uL — ABNORMAL LOW (ref 4.0–10.5)
nRBC: 0.5 % — ABNORMAL HIGH (ref 0.0–0.2)

## 2021-03-20 LAB — BASIC METABOLIC PANEL
Anion gap: 10 (ref 5–15)
BUN: <5 mg/dL — ABNORMAL LOW (ref 6–20)
CO2: 26 mmol/L (ref 22–32)
Calcium: 7.1 mg/dL — ABNORMAL LOW (ref 8.9–10.3)
Chloride: 94 mmol/L — ABNORMAL LOW (ref 98–111)
Creatinine, Ser: 0.41 mg/dL — ABNORMAL LOW (ref 0.61–1.24)
GFR, Estimated: >60 mL/min (ref >60)
Glucose, Bld: 91 mg/dL (ref 70–99)
Potassium: 3 mmol/L — ABNORMAL LOW (ref 3.5–5.1)
Sodium: 130 mmol/L — ABNORMAL LOW (ref 135–145)

## 2021-03-20 LAB — APTT: aPTT: 47 seconds — ABNORMAL HIGH (ref 24–36)

## 2021-03-20 LAB — AMMONIA: Ammonia: 28 umol/L (ref 9–35)

## 2021-03-20 LAB — LACTIC ACID, PLASMA: Lactic Acid, Venous: 2.1 mmol/L (ref 0.5–1.9)

## 2021-03-20 LAB — URINALYSIS, ROUTINE W REFLEX MICROSCOPIC
Bilirubin Urine: NEGATIVE
Glucose, UA: 50 mg/dL — AB
Hgb urine dipstick: NEGATIVE
Ketones, ur: 5 mg/dL — AB
Leukocytes,Ua: NEGATIVE
Nitrite: NEGATIVE
Protein, ur: NEGATIVE mg/dL
Specific Gravity, Urine: 1.012 (ref 1.005–1.030)
pH: 5 (ref 5.0–8.0)

## 2021-03-20 LAB — OSMOLALITY: Osmolality: 296 mOsm/kg — ABNORMAL HIGH (ref 275–295)

## 2021-03-20 LAB — GLUCOSE, CAPILLARY
Glucose-Capillary: 111 mg/dL — ABNORMAL HIGH (ref 70–99)
Glucose-Capillary: 119 mg/dL — ABNORMAL HIGH (ref 70–99)
Glucose-Capillary: 120 mg/dL — ABNORMAL HIGH (ref 70–99)
Glucose-Capillary: 125 mg/dL — ABNORMAL HIGH (ref 70–99)
Glucose-Capillary: 129 mg/dL — ABNORMAL HIGH (ref 70–99)
Glucose-Capillary: 74 mg/dL (ref 70–99)
Glucose-Capillary: 88 mg/dL (ref 70–99)
Glucose-Capillary: 93 mg/dL (ref 70–99)

## 2021-03-20 LAB — CORTISOL: Cortisol, Plasma: 21.7 ug/dL

## 2021-03-20 LAB — MRSA NEXT GEN BY PCR, NASAL: MRSA by PCR Next Gen: NOT DETECTED

## 2021-03-20 LAB — ECHOCARDIOGRAM COMPLETE
Height: 70 in
S' Lateral: 2.5 cm
Weight: 2338.64 oz

## 2021-03-20 LAB — SODIUM
Sodium: 135 mmol/L (ref 135–145)
Sodium: 135 mmol/L (ref 135–145)
Sodium: 135 mmol/L (ref 135–145)
Sodium: 134 mmol/L — ABNORMAL LOW (ref 135–145)

## 2021-03-20 LAB — ETHANOL: Alcohol, Ethyl (B): 111 mg/dL — ABNORMAL HIGH (ref <10)

## 2021-03-20 LAB — MAGNESIUM: Magnesium: 1.5 mg/dL — ABNORMAL LOW (ref 1.7–2.4)

## 2021-03-20 LAB — PHOSPHORUS: Phosphorus: 3.2 mg/dL (ref 2.5–4.6)

## 2021-03-20 LAB — PROCALCITONIN: Procalcitonin: <0.1 ng/mL

## 2021-03-20 LAB — CK: Total CK: 114 U/L (ref 49–397)

## 2021-03-20 MED ORDER — MAGNESIUM SULFATE 4 GM/100ML IV SOLN
4.0000 g | Freq: Once | INTRAVENOUS | Status: AC
  Administered 2021-03-20: 4 g via INTRAVENOUS
  Filled 2021-03-20: qty 100

## 2021-03-20 MED ORDER — METRONIDAZOLE 500 MG/100ML IV SOLN
500.0000 mg | Freq: Two times a day (BID) | INTRAVENOUS | Status: DC
Start: 2021-03-20 — End: 2021-03-26
  Administered 2021-03-20 – 2021-03-25 (×12): 500 mg via INTRAVENOUS
  Filled 2021-03-20 (×12): qty 100

## 2021-03-20 MED ORDER — LACTATED RINGERS IV BOLUS
1000.0000 mL | Freq: Once | INTRAVENOUS | Status: AC
  Administered 2021-03-20: 1000 mL via INTRAVENOUS

## 2021-03-20 MED ORDER — ACETAMINOPHEN 325 MG PO TABS: 650.0000 mg | ORAL_TABLET | Freq: Four times a day (QID) | ORAL | Status: DC | PRN

## 2021-03-20 MED ORDER — SODIUM CHLORIDE 0.9 % IV SOLN: INTRAVENOUS | Status: DC | PRN

## 2021-03-20 MED ORDER — POTASSIUM CHLORIDE CRYS ER 20 MEQ PO TBCR
20.0000 meq | EXTENDED_RELEASE_TABLET | ORAL | Status: AC
  Administered 2021-03-20 (×2): 20 meq via ORAL
  Filled 2021-03-20 (×2): qty 1

## 2021-03-20 MED ORDER — SODIUM CHLORIDE 0.9 % IV SOLN: 250.0000 mL | INTRAVENOUS | Status: DC

## 2021-03-20 MED ORDER — POTASSIUM CHLORIDE 10 MEQ/100ML IV SOLN
10.0000 meq | INTRAVENOUS | Status: AC
  Administered 2021-03-20 (×4): 10 meq via INTRAVENOUS
  Filled 2021-03-20 (×4): qty 100

## 2021-03-20 MED ORDER — MORPHINE SULFATE (PF) 2 MG/ML IV SOLN
2.0000 mg | INTRAVENOUS | Status: DC | PRN
  Administered 2021-03-21: 2 mg via INTRAVENOUS
  Filled 2021-03-20: qty 1

## 2021-03-20 MED ORDER — DEXTROSE-NACL 5-0.9 % IV SOLN: INTRAVENOUS | Status: DC

## 2021-03-20 MED ORDER — OXYCODONE HCL 5 MG PO TABS
5.0000 mg | ORAL_TABLET | ORAL | Status: DC | PRN
  Administered 2021-03-20 – 2021-03-23 (×5): 5 mg via ORAL
  Filled 2021-03-20 (×6): qty 1

## 2021-03-20 NOTE — Progress Notes (Signed)
K+ 3.0, Mg 1.5 Replaced per protocol

## 2021-03-20 NOTE — Progress Notes (Signed)
Subjective  -  A little more alert this morning Remains on low-dose pressors   Physical Exam:  Ulcerations present on the right leg.  He continues to have cellulitis or redness in the upper thigh.  He has a brisk posterior tibial Doppler signal       Assessment/Plan:    I am concerned that his right leg is not salvageable.  He does however appear to have adequate blood flow to the right leg currently with brisk Doppler signals at his ankle.  Despite this, I am concerned that he may end up with an above-knee amputation because of infection.  I would continue to resuscitate him with close observation of the right leg.  At this point, I do not think he is capable of making medical decisions so this will need to be addressed should he progress to the point where he needs amputation of the right leg.  I will plan on removing his staples in the left leg in the operating room if he requires amputation of the right leg  Wells Suad Autrey 03/20/2021 9:38 AM --  Vitals:   03/20/21 0700 03/20/21 0732  BP: 104/62   Pulse: (!) 143   Resp: (!) 36   Temp:  99.6 F (37.6 C)  SpO2: 92%     Intake/Output Summary (Last 24 hours) at 03/20/2021 0938 Last data filed at 03/20/2021 0700 Gross per 24 hour  Intake 1171.76 ml  Output 260 ml  Net 911.76 ml     Laboratory CBC    Component Value Date/Time   WBC 3.8 (L) 03/20/2021 0007   HGB 10.9 (L) 03/20/2021 0007   HCT 31.5 (L) 03/20/2021 0007   HCT 36.4 (L) 05/20/2018 1923   PLT 134 (L) 03/20/2021 0007    BMET    Component Value Date/Time   NA 130 (L) 03/20/2021 0007   K 3.0 (L) 03/20/2021 0007   CL 94 (L) 03/20/2021 0007   CO2 26 03/20/2021 0007   GLUCOSE 91 03/20/2021 0007   BUN <5 (L) 03/20/2021 0007   CREATININE 0.41 (L) 03/20/2021 0007   CALCIUM 7.1 (L) 03/20/2021 0007   GFRNONAA >60 03/20/2021 0007   GFRAA >60 11/12/2019 0359    COAG Lab Results  Component Value Date   INR 0.9 03/19/2021   INR 1.0 11/19/2020    INR 1.1 08/05/2020   No results found for: PTT  Antibiotics Anti-infectives (From admission, onward)    Start     Dose/Rate Route Frequency Ordered Stop   03/20/21 0500  vancomycin (VANCOREADY) IVPB 750 mg/150 mL  Status:  Discontinued        750 mg 150 mL/hr over 60 Minutes Intravenous Every 8 hours 03/19/21 2019 03/19/21 2021   03/20/21 0500  vancomycin (VANCOREADY) IVPB 750 mg/150 mL        750 mg 150 mL/hr over 60 Minutes Intravenous Every 8 hours 03/19/21 2021 03/27/21 0459   03/20/21 0200  ceFEPIme (MAXIPIME) 2 g in sodium chloride 0.9 % 100 mL IVPB  Status:  Discontinued        2 g 200 mL/hr over 30 Minutes Intravenous Every 8 hours 03/19/21 2019 03/19/21 2021   03/20/21 0200  ceFEPIme (MAXIPIME) 2 g in sodium chloride 0.9 % 100 mL IVPB        2 g 200 mL/hr over 30 Minutes Intravenous Every 8 hours 03/19/21 2021 03/27/21 0159   03/19/21 1815  vancomycin (VANCOREADY) IVPB 1500 mg/300 mL  1,500 mg 150 mL/hr over 120 Minutes Intravenous  Once 03/19/21 1731 03/19/21 2133   03/19/21 1745  ceFEPIme (MAXIPIME) 2 g in sodium chloride 0.9 % 100 mL IVPB        2 g 200 mL/hr over 30 Minutes Intravenous  Once 03/19/21 1731 03/19/21 1852   03/19/21 1745  metroNIDAZOLE (FLAGYL) IVPB 500 mg        500 mg 100 mL/hr over 60 Minutes Intravenous  Once 03/19/21 1731 03/19/21 1926        V. Charlena Cross, M.D., [facility] [place] Office: [phone] Pager:  [phone]

## 2021-03-20 NOTE — Plan of Care (Addendum)
Overnight, titrating down on levophed as tolerated. RLE washed with soap + water, wrapped with vaseline guaze and Kerlix. Awainting formal wound care orders for WOC vs. Vasc Sx.    Problem: Education: Goal: Knowledge of General Education information will improve Description: Including pain rating scale, medication(s)/side effects and non-pharmacologic comfort measures Outcome: Progressing   Problem: Health Behavior/Discharge Planning: Goal: Ability to manage health-related needs will improve Outcome: Progressing   Problem: Clinical Measurements: Goal: Ability to maintain clinical measurements within normal limits will improve Outcome: Progressing Goal: Will remain free from infection Outcome: Progressing Goal: Diagnostic test results will improve Outcome: Progressing Goal: Respiratory complications will improve Outcome: Progressing Goal: Cardiovascular complication will be avoided Outcome: Progressing   Problem: Activity: Goal: Risk for activity intolerance will decrease Outcome: Progressing   Problem: Nutrition: Goal: Adequate nutrition will be maintained Outcome: Progressing   Problem: Coping: Goal: Level of anxiety will decrease Outcome: Progressing   Problem: Elimination: Goal: Will not experience complications related to bowel motility Outcome: Progressing Goal: Will not experience complications related to urinary retention Outcome: Progressing   Problem: Pain Managment: Goal: General experience of comfort will improve Outcome: Progressing   Problem: Safety: Goal: Ability to remain free from injury will improve Outcome: Progressing   Problem: Skin Integrity: Goal: Risk for impaired skin integrity will decrease Outcome: Progressing

## 2021-03-20 NOTE — Progress Notes (Addendum)
NAME:  Jake Nguyen, MRN:  [id number], DOB:  [birth year], LOS: 1 ADMISSION DATE:  03/19/2021, CONSULTATION DATE:  12/9 REFERRING MD:  Tegeler, CHIEF COMPLAINT:  AMS   History of Present Illness:  Patient is encephalopathic. Therefore history has been obtained from chart review.   Jake Nguyen, is a 55 y.o. male, who presented to the [facility] ED via [facility] with a AMS.  They have a pertinent past medical history of PAD, HLD, ETOH abuse, osteomyelitis, fusobacterium nucleatum bacteremia, L AKA 8/13, patient experiencing homelessness  Per documentation, patient drank 40oz of ETOH, became more altered than normal, EMS was called and he was brought to [facility]  On arrival he was found to be hypothermic with a temperature of 84.11f, he was also found to have right lower extremity wounds with some swelling. CTH negative for acute changes, no evidence of gas on RLE films. The patient was found to be hyponatremic NA 126, lactate 2.0. Code sepsis. The patient became hypotensive in the ED. 2L IVF given. BC obtained. Vanc/Cefepime/flagyl started.  PCCM was consulted for admission.  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 Head CT negative. Vanc/Cefepime/flagyl  Interim History / Subjective:   Tachycardic Having LE pain w bath Norepi 8   Objective   Blood pressure 104/62, pulse (!) 143, temperature 99.6 F (37.6 C), temperature source Oral, resp. rate (!) 36, height 5\' 10"  (1.778 m), weight 66.3 kg, SpO2 92 %.        Intake/Output Summary (Last 24 hours) at 03/20/2021 0825 Last data filed at 03/20/2021 0700 Gross per 24 hour  Intake 1171.76 ml  Output 260 ml  Net 911.76 ml   Filed Weights   03/19/21 2029 03/20/21 0107  Weight: 75 kg 66.3 kg    Examination: General:  Ill appearing man, no distress HEENT: MM pink/moist, anicteric, atraumatic, Lump on left jaw, soft, cool, nontender Neuro: Awake and interacting, good strength, moving all ext CV: tachy, regular, no M PULM:  decreased both bases GI: nondistended, + BS Extremities: L AKA with staples in place, RLE wounds, cool             Resolved Hospital Problem list     Assessment & Plan:  Septic Shock Hypothermia RLE wounds- present on admission Lactic Acidosis Suspect secondary to RLE wounds with additional component of hypothermia. TSH WNL. Lactate 2.0. Received Vanc and cefepime in ED. 84 degrees on admission. BP documented 70/46. 100% RA. CXR with no infiltrate. HX osteomyelitis, fusobacterium nucleatum bacteremia. -wean norepi as able for MAP > 65 -continue volume resuscitation -follow cx data -empiric cefepime, vanco, narrow depending on cx's. Add flagyl given hx fusobacterium in the past -TTE pending -appreciate Dr 14/10/22 and Dr August Saucer assessments. Will likely need OR for amputation once medically optimized for a procedure -wound care -slow rewarming -pain control  Acute metabolic encephalopathy- Improving Suspect secondary to sepsis, hypothermia. CT head negative for acute process. Ammonia elevated- 44. GCS 15 on exam. Presented with AMS -follow neuro status -follow serial Na -ammonia reassuring -UDS pending  Hyponatremia, acute on chronic NA 126 on admission, 133 in 08/22, chronic ETOH use, ?beer potomania. Osm consistent w hypovolemia -follow BMP, Na -volume resuscitate w isotonic fluids   ETOH abuse HX Alcohol withdrawal Reports drinking a 40 on 12/9, admissions with DT in past -continue  CIWA protocol -continue Thiamine -continue Folate  HX right AKA On 11/21/20 with vascular surgery. Staples remain in [place].  -appreciate wound care  Thrombocytopenia PLT 115. Suspect secondary to sepsis -follow CBC  Experiencing homlessness Failure to  thrive Malnutrition- Unspecified Multiple admissions with lower extremity wound issues, issues with chronic staples, presented with RLE leg wounds. Albumin 2.1. -will need Social work consult -Nutrition consultation  HX PAD HX HLD Reports not taking any home medications -consider resumption of asa and lipitor at some point   Best Practice (right click and "Reselect all SmartList Selections" daily)   Diet/type: NPO and NPO w/ oral meds DVT prophylaxis: prophylactic heparin  GI prophylaxis: PPI Lines: N/A Foley:  N/A Code Status:  full code Last date of multidisciplinary goals of care discussion [Pending]  Labs   CBC: Recent Labs  Lab 03/19/21 1704 03/20/21 0007  WBC 3.4* 3.8*  NEUTROABS 2.6  --   HGB 12.8* 10.9*  HCT 37.3* 31.5*  MCV 81.1 80.4  PLT 115* 134*    Basic Metabolic Panel: Recent Labs  Lab 03/19/21 1704 03/20/21 0007  NA 126* 130*  K 3.7 3.0*  CL 88* 94*  CO2 26 26  GLUCOSE 100* 91  BUN <5* <5*  CREATININE <0.30* 0.41*  CALCIUM 7.9* 7.1*  MG  --  1.5*  PHOS  --  3.2   GFR: Estimated Creatinine Clearance: 100.1 mL/min (A) (by C-G formula based on SCr of 0.41 mg/dL (L)). Recent Labs  Lab 03/19/21 1651 03/19/21 1704 03/20/21 0007  PROCALCITON  --   --  <0.10  WBC  --  3.4* 3.8*  LATICACIDVEN 2.0*  --  2.1*    Liver Function Tests: Recent Labs  Lab 03/19/21 1704  AST 81*  ALT 40  ALKPHOS 113  BILITOT 0.6  PROT 6.4*  ALBUMIN 2.1*   No results for input(s): LIPASE, AMYLASE in the last 168 hours. Recent Labs  Lab 03/19/21 1732 03/20/21 0007  AMMONIA 44* 28    ABG    Component Value Date/Time   TCO2 25 08/02/2009 2156     Coagulation Profile: Recent Labs  Lab 03/19/21 1704  INR 0.9    Cardiac Enzymes: Recent Labs  Lab 03/20/21 0007  CKTOTAL 114    HbA1C: Hgb A1c MFr Bld  Date/Time Value Ref Range Status  07/31/2020 10:36 AM 6.6 (H) 4.8 - 5.6 % Final    Comment:    (NOTE) Pre diabetes:           5.7%-6.4%  Diabetes:              >6.4%  Glycemic control for   <7.0% adults with diabetes   05/23/2018 09:52 AM 6.9 (H) 4.8 - 5.6 % Final    Comment:    (NOTE) Pre diabetes:          5.7%-6.4% Diabetes:              >6.4% Glycemic control for   <7.0% adults with diabetes     CBG: Recent Labs  Lab 03/19/21 2313 03/20/21 0025 03/20/21 0229 03/20/21 0446 03/20/21 0732  GLUCAP 120* 88 74 93 119*     Critical care time: 37 Minutes     Baltazar Apo, MD, PhD 03/20/2021, 8:26 AM [facility] [phone] or if no answer before 7:00PM call [phone] For any issues after 7:00PM please call eLink [phone]

## 2021-03-20 NOTE — Progress Notes (Signed)
  Echocardiogram 2D Echocardiogram has been performed.  Roosvelt Maser F 03/20/2021, 4:03 PM

## 2021-03-21 DIAGNOSIS — R6521 Severe sepsis with septic shock: Secondary | ICD-10-CM | POA: Diagnosis not present

## 2021-03-21 DIAGNOSIS — A419 Sepsis, unspecified organism: Secondary | ICD-10-CM | POA: Diagnosis not present

## 2021-03-21 LAB — VANCOMYCIN, TROUGH: Vancomycin Tr: 12 ug/mL — ABNORMAL LOW (ref 15–20)

## 2021-03-21 LAB — BASIC METABOLIC PANEL
Anion gap: 9 (ref 5–15)
BUN: <5 mg/dL — ABNORMAL LOW (ref 6–20)
CO2: 26 mmol/L (ref 22–32)
Calcium: 7.3 mg/dL — ABNORMAL LOW (ref 8.9–10.3)
Chloride: 98 mmol/L (ref 98–111)
Creatinine, Ser: 0.59 mg/dL — ABNORMAL LOW (ref 0.61–1.24)
GFR, Estimated: >60 mL/min (ref >60)
Glucose, Bld: 119 mg/dL — ABNORMAL HIGH (ref 70–99)
Potassium: 2.9 mmol/L — ABNORMAL LOW (ref 3.5–5.1)
Sodium: 133 mmol/L — ABNORMAL LOW (ref 135–145)

## 2021-03-21 LAB — SODIUM
Sodium: 134 mmol/L — ABNORMAL LOW (ref 135–145)
Sodium: 134 mmol/L — ABNORMAL LOW (ref 135–145)
Sodium: 133 mmol/L — ABNORMAL LOW (ref 135–145)
Sodium: 133 mmol/L — ABNORMAL LOW (ref 135–145)

## 2021-03-21 LAB — GLUCOSE, CAPILLARY
Glucose-Capillary: 110 mg/dL — ABNORMAL HIGH (ref 70–99)
Glucose-Capillary: 138 mg/dL — ABNORMAL HIGH (ref 70–99)
Glucose-Capillary: 113 mg/dL — ABNORMAL HIGH (ref 70–99)
Glucose-Capillary: 105 mg/dL — ABNORMAL HIGH (ref 70–99)
Glucose-Capillary: 146 mg/dL — ABNORMAL HIGH (ref 70–99)

## 2021-03-21 LAB — URINE CULTURE: Culture: NO GROWTH

## 2021-03-21 LAB — CBC
HCT: 30 % — ABNORMAL LOW (ref 39.0–52.0)
Hemoglobin: 10 g/dL — ABNORMAL LOW (ref 13.0–17.0)
MCH: 27.5 pg (ref 26.0–34.0)
MCHC: 33.3 g/dL (ref 30.0–36.0)
MCV: 82.4 fL (ref 80.0–100.0)
Platelets: 63 10*3/uL — ABNORMAL LOW (ref 150–400)
RBC: 3.64 MIL/uL — ABNORMAL LOW (ref 4.22–5.81)
RDW: 14.7 % (ref 11.5–15.5)
WBC: 4.7 10*3/uL (ref 4.0–10.5)
nRBC: 0 % (ref 0.0–0.2)

## 2021-03-21 LAB — VANCOMYCIN, PEAK: Vancomycin Pk: 22 ug/mL — ABNORMAL LOW (ref 30–40)

## 2021-03-21 LAB — PROCALCITONIN: Procalcitonin: 0.18 ng/mL

## 2021-03-21 LAB — MAGNESIUM: Magnesium: 1.9 mg/dL (ref 1.7–2.4)

## 2021-03-21 MED ORDER — ZOLPIDEM TARTRATE 5 MG PO TABS
10.0000 mg | ORAL_TABLET | Freq: Every evening | ORAL | Status: DC | PRN
  Administered 2021-03-21 – 2021-03-26 (×6): 10 mg via ORAL
  Filled 2021-03-21 (×6): qty 2

## 2021-03-21 MED ORDER — POTASSIUM CHLORIDE 20 MEQ PO PACK
40.0000 meq | PACK | ORAL | Status: AC
  Administered 2021-03-21 (×2): 40 meq via ORAL
  Filled 2021-03-21 (×2): qty 2

## 2021-03-21 MED ORDER — VANCOMYCIN HCL IN DEXTROSE 1-5 GM/200ML-% IV SOLN
1000.0000 mg | Freq: Three times a day (TID) | INTRAVENOUS | Status: DC
  Administered 2021-03-21 – 2021-03-26 (×14): 1000 mg via INTRAVENOUS
  Filled 2021-03-21 (×16): qty 200

## 2021-03-21 MED ORDER — LACTATED RINGERS IV BOLUS
1000.0000 mL | Freq: Once | INTRAVENOUS | Status: AC
  Administered 2021-03-21: 1000 mL via INTRAVENOUS

## 2021-03-21 NOTE — Progress Notes (Signed)
Pharmacy Antibiotic Note  Jake Nguyen is a 55 y.o. male admitted on 03/19/2021 with increased altered mental status, R leg necrotic, with concern for sepsis. Pharmacy has been consulted for vancomycin and cefepime dosing.  Vancomycin trough came back at 12, peak came back at 22 - calculated AUC 384. Scr increasing from <0.3 to 0.59 (CrCl 98 mL/min).   Plan: Will increase vancomycin 1g IV every 8 hours (estAUC 512) Cefepime 2g, followed by 2g IV q8hr x7 days  Monitor renal function, CBC, culture results.  Height: 5\' 10"  (177.8 cm) Weight: 65.4 kg (144 lb 2.9 oz) IBW/kg (Calculated) : 73 Temp (24hrs), Avg:98.4 F (36.9 C), Min:97.7 F (36.5 C), Max:99.5 F (37.5 C)  Recent Labs  Lab 03/19/21 1651 03/19/21 1704 03/20/21 0007 03/21/21 0152 03/21/21 0712 03/21/21 1215 03/21/21 1607  WBC  --  3.4* 3.8*  --  4.7  --   --   CREATININE  --  <0.30* 0.41* 0.59*  --   --   --   LATICACIDVEN 2.0*  --  2.1*  --   --   --   --   VANCOTROUGH  --   --   --   --   --  12*  --   VANCOPEAK  --   --   --   --   --   --  22*     Estimated Creatinine Clearance: 98.8 mL/min (A) (by C-G formula based on SCr of 0.59 mg/dL (L)).    Allergies  Allergen Reactions   Pork-Derived Products Other (See Comments)    Pt states he doesn't eat pork   Antimicrobials this admission: Vancomycin 12/9 >> Cefepime 12/9 >> Metronidazole 12/9 >>  Microbiology results: 12/9 BCx: ngtd  12/9 Ucx: ng  Thank you for allowing pharmacy to be a part of this patient's care.  14/9, PharmD, BCCCP Clinical Pharmacist  Phone: [phone] 03/21/2021 5:41 PM  Please check AMION for all [facility] Pharmacy phone numbers After 10:00 PM, call Main Pharmacy [phone]

## 2021-03-21 NOTE — Progress Notes (Signed)
Xeroform and Vaseline gauze + ABD and Kerlix gauze wrap applied to RLE. Dressing changed.

## 2021-03-21 NOTE — Progress Notes (Signed)
Subjective  -   Mental status is improved today Pressor requirement decreasing   Physical Exam:  Extensive right lower extremity wounds Brisk posterior tibial Doppler signal      Assessment/Plan:   Patient remains at high risk for right leg amputation.  This likely played a role in his septic picture.  The erythema has decreased.  I would continue with IV antibiotics and resuscitation.  We will continue to monitor.  I would asked the wound care team to evaluate the patient.  He remains at high risk for amputation.  Wells Jake Nguyen 03/21/2021 11:25 AM --  Vitals:   03/21/21 0700 03/21/21 0830  BP: 103/75   Pulse: (!) 101   Resp: 14   Temp:  97.8 F (36.6 C)  SpO2: 94%     Intake/Output Summary (Last 24 hours) at 03/21/2021 1125 Last data filed at 03/21/2021 0700 Gross per 24 hour  Intake 4950.92 ml  Output 4650 ml  Net 300.92 ml     Laboratory CBC    Component Value Date/Time   WBC 4.7 03/21/2021 0712   HGB 10.0 (L) 03/21/2021 0712   HCT 30.0 (L) 03/21/2021 0712   HCT 36.4 (L) 05/20/2018 1923   PLT 63 (L) 03/21/2021 0712    BMET    Component Value Date/Time   NA 134 (L) 03/21/2021 0712   K 2.9 (L) 03/21/2021 0152   CL 98 03/21/2021 0152   CO2 26 03/21/2021 0152   GLUCOSE 119 (H) 03/21/2021 0152   BUN <5 (L) 03/21/2021 0152   CREATININE 0.59 (L) 03/21/2021 0152   CALCIUM 7.3 (L) 03/21/2021 0152   GFRNONAA >60 03/21/2021 0152   GFRAA >60 11/12/2019 0359    COAG Lab Results  Component Value Date   INR 0.9 03/19/2021   INR 1.0 11/19/2020   INR 1.1 08/05/2020   No results found for: PTT  Antibiotics Anti-infectives (From admission, onward)    Start     Dose/Rate Route Frequency Ordered Stop   03/20/21 1115  metroNIDAZOLE (FLAGYL) IVPB 500 mg        500 mg 100 mL/hr over 60 Minutes Intravenous Every 12 hours 03/20/21 1022     03/20/21 0500  vancomycin (VANCOREADY) IVPB 750 mg/150 mL  Status:  Discontinued        750 mg 150 mL/hr over  60 Minutes Intravenous Every 8 hours 03/19/21 2019 03/19/21 2021   03/20/21 0500  vancomycin (VANCOREADY) IVPB 750 mg/150 mL        750 mg 150 mL/hr over 60 Minutes Intravenous Every 8 hours 03/19/21 2021 03/27/21 0459   03/20/21 0200  ceFEPIme (MAXIPIME) 2 g in sodium chloride 0.9 % 100 mL IVPB  Status:  Discontinued        2 g 200 mL/hr over 30 Minutes Intravenous Every 8 hours 03/19/21 2019 03/19/21 2021   03/20/21 0200  ceFEPIme (MAXIPIME) 2 g in sodium chloride 0.9 % 100 mL IVPB        2 g 200 mL/hr over 30 Minutes Intravenous Every 8 hours 03/19/21 2021 03/27/21 0159   03/19/21 1815  vancomycin (VANCOREADY) IVPB 1500 mg/300 mL        1,500 mg 150 mL/hr over 120 Minutes Intravenous  Once 03/19/21 1731 03/20/21 2043   03/19/21 1745  ceFEPIme (MAXIPIME) 2 g in sodium chloride 0.9 % 100 mL IVPB        2 g 200 mL/hr over 30 Minutes Intravenous  Once 03/19/21 1731 03/19/21 1852   03/19/21  1745  metroNIDAZOLE (FLAGYL) IVPB 500 mg        500 mg 100 mL/hr over 60 Minutes Intravenous  Once 03/19/21 1731 03/19/21 1926        V. Charlena Cross, M.D., [facility] [place] Office: [phone] Pager:  [phone]

## 2021-03-21 NOTE — Progress Notes (Signed)
NAME:  Jake Nguyen, MRN:  [id number], DOB:  [birth year], LOS: 2 ADMISSION DATE:  03/19/2021, CONSULTATION DATE:  12/9 REFERRING MD:  Tegeler, CHIEF COMPLAINT:  AMS   History of Present Illness:  Patient is encephalopathic. Therefore history has been obtained from chart review.   Jake Nguyen, is a 55 y.o. male, who presented to the [facility] ED via [facility] with a AMS.  They have a pertinent past medical history of PAD, HLD, ETOH abuse, osteomyelitis, fusobacterium nucleatum bacteremia, L AKA 8/13, patient experiencing homelessness  Per documentation, patient drank 40oz of ETOH, became more altered than normal, EMS was called and he was brought to [facility]  On arrival he was found to be hypothermic with a temperature of 84.68f, he was also found to have right lower extremity wounds with some swelling. CTH negative for acute changes, no evidence of gas on RLE films. The patient was found to be hyponatremic NA 126, lactate 2.0. Code sepsis. The patient became hypotensive in the ED. 2L IVF given. BC obtained. Vanc/Cefepime/flagyl started.  PCCM was consulted for admission.  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 Head CT negative. Vanc/Cefepime/flagyl Echocardiogram 03/20/2021 >> EF 60-65% without regional wall motion abnormality.  Diastolic parameters indeterminate.  RV normal size and function  Interim History / Subjective:   P.o. diet started 12/10, tolerating Norepinephrine improved to 2 Sodium 134 I/O + 1.4L total C/o insomnia   Objective   Blood pressure 103/75, pulse (!) 101, temperature 99.5 F (37.5 C), temperature source Oral, resp. rate 14, height 5\' 10"  (1.778 m), weight 65.4 kg, SpO2 94 %.        Intake/Output Summary (Last 24 hours) at 03/21/2021  0739 Last data filed at 03/21/2021 0700 Gross per 24 hour  Intake 5611.55 ml  Output 5050 ml  Net 561.55 ml   Filed Weights   03/19/21 2029 03/20/21 0107 03/21/21 0500  Weight: 75 kg 66.3 kg 65.4 kg    Examination: General: Ill-appearing man, sitting up in bed, but stronger today HEENT: Oropharynx clear, poor dentition, Neuro: Awake, interacting, answer questions, follows commands CV: Tachycardic, regular, distant, no murmur PULM: Decreased to both bases, no wheeze GI: Nondistended, positive bowel sounds Extremities: Left AKA, staples still in place, right lower extremity wounds are wrapped.  Right lower extremity a bit warmer.  Photos of right lower extremity wounds from presentation:            Resolved Hospital Problem list     Assessment & Plan:  Septic Shock Hypothermia RLE wounds- present on admission, presumed acute cellulitis although procalcitonin negative Lactic Acidosis Suspect secondary to RLE wounds with additional component of hypothermia. TSH WNL. Lactate 2.0. Received Vanc and cefepime in ED. 84 degrees on admission. BP documented 70/46. 100% RA. CXR with no infiltrate. HX osteomyelitis, fusobacterium nucleatum bacteremia. -Repeat IV fluid bolus this morning -Continue wean norepinephrine, map goal 65 -Follow culture data, negative so far -Continue vancomycin, cefepime, Flagyl (history of Fusobacterium).  -Pain control -Wound care consulted -Appreciate assistance from Dr. Trula Slade.  May still ultimately require OR for amputation once the limb demarcates.  We will also need staples removed, planning to probably do this at bedside [id number]  Acute metabolic encephalopathy- Improved Suspect secondary to sepsis, hypothermia. CT head negative for acute process. Ammonia elevated- 44. GCS 15 on  exam. Presented with AMS -Continue to follow neurological status -Follow BMP, sodium.  Improved -UDS still pending   Hyponatremia, acute on chronic NA 126 on  admission, 133 in 08/22, chronic ETOH use, ?beer potomania. Osm consistent w hypovolemia -Follow BMP, sodium -Volume resuscitation with isotonic fluids   ETOH abuse HX Alcohol withdrawal Insomnia Reports drinking a 40 on 12/9, admissions with DT in past -add ambien 12/11 -On CIWA coverage protocol, no evidence of agitation, scores are low -Continue thiamine, folate  HX right AKA On 11/21/20 with vascular surgery. Staples remain in [place].  -Appreciate wound care assistance -Vascular planning to remove staples 12/12  Thrombocytopenia PLT 115. Suspect secondary to sepsis -Follow CBC  Experiencing homlessness Failure to thrive Malnutrition- Unspecified Multiple admissions with lower extremity wound issues, issues with chronic staples, presented with RLE leg wounds. Albumin 2.1. -We will need social work consult and assistance -Appreciate nutrition consultation  HX PAD HX HLD Reports not taking any home medications -consider resumption of asa and lipitor at some point   Best Practice (right click and "Reselect all SmartList Selections" daily)   Diet/type: NPO and NPO w/ oral meds DVT prophylaxis: prophylactic heparin  GI prophylaxis: PPI Lines: N/A Foley:  N/A Code Status:  full code Last date of multidisciplinary goals of care discussion [Pending]  Labs   CBC: Recent Labs  Lab 03/19/21 1704 03/20/21 0007  WBC 3.4* 3.8*  NEUTROABS 2.6  --   HGB 12.8* 10.9*  HCT 37.3* 31.5*  MCV 81.1 80.4  PLT 115* 134*    Basic Metabolic Panel: Recent Labs  Lab 03/19/21 1704 03/20/21 0007 03/20/21 0955 03/20/21 1438 03/20/21 1746 03/20/21 2213 03/21/21 0154  NA 126* 130* 135 135 135 134* 134*  K 3.7 3.0*  --   --   --   --   --   CL 88* 94*  --   --   --   --   --   CO2 26 26  --   --   --   --   --   GLUCOSE 100* 91  --   --   --   --   --   BUN <5* <5*  --   --   --   --   --   CREATININE <0.30* 0.41*  --   --   --   --   --   CALCIUM 7.9* 7.1*  --   --   --    --   --   MG  --  1.5*  --   --   --   --   --   PHOS  --  3.2  --   --   --   --   --    GFR: Estimated Creatinine Clearance: 98.8 mL/min (A) (by C-G formula based on SCr of 0.41 mg/dL (L)). Recent Labs  Lab 03/19/21 1651 03/19/21 1704 03/20/21 0007 03/21/21 0152  PROCALCITON  --   --  <0.10 0.18  WBC  --  3.4* 3.8*  --   LATICACIDVEN 2.0*  --  2.1*  --     Liver Function Tests: Recent Labs  Lab 03/19/21 1704  AST 81*  ALT 40  ALKPHOS 113  BILITOT 0.6  PROT 6.4*  ALBUMIN 2.1*   No results for input(s): LIPASE, AMYLASE in the last 168 hours. Recent Labs  Lab 03/19/21 1732 03/20/21 0007  AMMONIA 44* 28    ABG    Component Value Date/Time   TCO2  25 08/02/2009 2156     Coagulation Profile: Recent Labs  Lab 03/19/21 1704  INR 0.9    Cardiac Enzymes: Recent Labs  Lab 03/20/21 0007  CKTOTAL 114    HbA1C: Hgb A1c MFr Bld  Date/Time Value Ref Range Status  07/31/2020 10:36 AM 6.6 (H) 4.8 - 5.6 % Final    Comment:    (NOTE) Pre diabetes:          5.7%-6.4%  Diabetes:              >6.4%  Glycemic control for   <7.0% adults with diabetes   05/23/2018 09:52 AM 6.9 (H) 4.8 - 5.6 % Final    Comment:    (NOTE) Pre diabetes:          5.7%-6.4% Diabetes:              >6.4% Glycemic control for   <7.0% adults with diabetes     CBG: Recent Labs  Lab 03/20/21 1137 03/20/21 1529 03/20/21 1939 03/20/21 2342 03/21/21 0349  GLUCAP 125* 120* 129* 111* 110*     Critical care time: 32 Minutes     Baltazar Apo, MD, PhD 03/21/2021, 7:39 AM [place] [facility] [phone] or if no answer before 7:00PM call [phone] For any issues after 7:00PM please call eLink [phone]

## 2021-03-22 DIAGNOSIS — L03115 Cellulitis of right lower limb: Secondary | ICD-10-CM

## 2021-03-22 DIAGNOSIS — E876 Hypokalemia: Secondary | ICD-10-CM

## 2021-03-22 LAB — BASIC METABOLIC PANEL
Anion gap: 7 (ref 5–15)
BUN: <5 mg/dL — ABNORMAL LOW (ref 6–20)
CO2: 27 mmol/L (ref 22–32)
Calcium: 7.7 mg/dL — ABNORMAL LOW (ref 8.9–10.3)
Chloride: 99 mmol/L (ref 98–111)
Creatinine, Ser: 0.61 mg/dL (ref 0.61–1.24)
GFR, Estimated: >60 mL/min (ref >60)
Glucose, Bld: 102 mg/dL — ABNORMAL HIGH (ref 70–99)
Potassium: 3.6 mmol/L (ref 3.5–5.1)
Sodium: 133 mmol/L — ABNORMAL LOW (ref 135–145)

## 2021-03-22 LAB — GLUCOSE, CAPILLARY
Glucose-Capillary: 93 mg/dL (ref 70–99)
Glucose-Capillary: 106 mg/dL — ABNORMAL HIGH (ref 70–99)
Glucose-Capillary: 103 mg/dL — ABNORMAL HIGH (ref 70–99)
Glucose-Capillary: 137 mg/dL — ABNORMAL HIGH (ref 70–99)
Glucose-Capillary: 114 mg/dL — ABNORMAL HIGH (ref 70–99)

## 2021-03-22 LAB — CK: Total CK: 132 U/L (ref 49–397)

## 2021-03-22 LAB — SODIUM
Sodium: 133 mmol/L — ABNORMAL LOW (ref 135–145)
Sodium: 135 mmol/L (ref 135–145)
Sodium: 133 mmol/L — ABNORMAL LOW (ref 135–145)
Sodium: 134 mmol/L — ABNORMAL LOW (ref 135–145)

## 2021-03-22 LAB — MAGNESIUM: Magnesium: 1.6 mg/dL — ABNORMAL LOW (ref 1.7–2.4)

## 2021-03-22 MED ORDER — DEXTROSE-NACL 5-0.9 % IV SOLN: INTRAVENOUS | Status: DC

## 2021-03-22 MED ORDER — POTASSIUM CHLORIDE CRYS ER 20 MEQ PO TBCR
40.0000 meq | EXTENDED_RELEASE_TABLET | Freq: Two times a day (BID) | ORAL | Status: AC
  Administered 2021-03-22 (×2): 40 meq via ORAL
  Filled 2021-03-22 (×2): qty 2

## 2021-03-22 NOTE — Progress Notes (Addendum)
  Progress Note    03/22/2021 8:52 AM * No surgery found *  Subjective:  complaining of lab draws   Vitals:   03/22/21 0600 03/22/21 0743  BP: (!) 96/56   Pulse: (!) 106   Resp:    Temp:  98.9 F (37.2 C)  SpO2: 92%    Physical Exam: Cardiac:  regular Lungs:  non labored Incisions: left AKA staples removed Extremities:  extensive right leg wounds. Dressings changed Neurologic: alert and oriented  CBC    Component Value Date/Time   WBC 4.7 03/21/2021 0712   RBC 3.64 (L) 03/21/2021 0712   HGB 10.0 (L) 03/21/2021 0712   HCT 30.0 (L) 03/21/2021 0712   HCT 36.4 (L) 05/20/2018 1923   PLT 63 (L) 03/21/2021 0712   MCV 82.4 03/21/2021 0712   MCH 27.5 03/21/2021 0712   MCHC 33.3 03/21/2021 0712   RDW 14.7 03/21/2021 0712   LYMPHSABS 0.6 (L) 03/19/2021 1704   MONOABS 0.2 03/19/2021 1704   EOSABS 0.0 03/19/2021 1704   BASOSABS 0.0 03/19/2021 1704    BMET    Component Value Date/Time   NA 133 (L) 03/22/2021 0119   K 2.9 (L) 03/21/2021 0152   CL 98 03/21/2021 0152   CO2 26 03/21/2021 0152   GLUCOSE 119 (H) 03/21/2021 0152   BUN <5 (L) 03/21/2021 0152   CREATININE 0.59 (L) 03/21/2021 0152   CALCIUM 7.3 (L) 03/21/2021 0152   GFRNONAA >60 03/21/2021 0152   GFRAA >60 11/12/2019 0359    INR    Component Value Date/Time   INR 0.9 03/19/2021 1704     Intake/Output Summary (Last 24 hours) at 03/22/2021 0852 Last data filed at 03/22/2021 0634 Gross per 24 hour  Intake 3395.62 ml  Output 6250 ml  Net -2854.38 ml     Assessment/Plan:  55 y.o. male with extensive RLE wounds  L AKA staples removed RLE wound dressings changed. Wound Care consulted for future wound changes Continue [place]/Vanc/Cefepime Off pressors  High risk for RLE amputation   [facility] [phone] 03/22/2021 8:52 AM  I agree with the above.  I have seen and evaluated the patient.  I changed his dressings today.  I am still very concerned that  he is going to require an amputation of the right leg.  He strongly does not want this to happen.  I would recommend cleaning the leg with soap and water and then using Xeroform for wound coverage.  This can continue to be monitored to see if there is any chance of salvage of the leg.  He should remain on IV antibiotics.  I will continue to follow him while he is in the hospital and make decisions regarding limb salvage prior to discharge.  Durene Cal

## 2021-03-22 NOTE — TOC Initial Note (Signed)
Transition of Care [facility]) - Initial/Assessment Note    Patient Details  Name: Jake Nguyen MRN: [id number] Date of Birth: December 04, 1965  Transition of Care [facility]) CM/SW Contact:    Ralene Bathe, LCSWA Phone Number: 03/22/2021, 11:04 AM  Clinical Narrative:                 CSW met with the patient at bedside after being notified that the patient is currently unhoused.  The patient was alert and oriented during the assessment.  CSW inquired about the patient's current living and financial situation as the patient is also failure to thrive and was found to the hypothermic.  The patient reports that he has been "living on the street".  Prior to being unhoused he was at a SNF [facility]).  The patient reports that he "eats whatever he can".  He reports that he has income through social security [phone]) but the check is currently going to the SNF and the patient needs a state ID, SS card, and bank account to have the check transferred to him and he does not have any of the needed items as he "lost his wallet".   CSW inquired about natural supports.  The patient reported that he did not have any family in the area.  He has a sister who lives in [place] and a brother who lives in [place].  CSW inquired about the patient's ability to live with the brother and was informed that his brother does not have a stable living situation.  CSW inquired about the patient's willingness to return to a SNF should he need to get a RLE amputation.  The patient then reported that he does not want to go back to a facility and has friend's in the area that he can stay with.  CSW inquired further and found that the patient could stay with these friends for a "couple of days."    CSW then inquired about the patient's substance use.  The patient reports that he drinks liquor and/ or beer daily and cannot recall the amount he drinks.  CSW inquired about the patient's openness to going to inpatient rehab or receiving SA resources.   The patient declined stating that he has already been to rehab previously and has attempted to abstain from drinking alcohol "more times than [he] can count".  The patient reports that he plans to abstain from alcohol after this hospitalization.  CSW contacted Delorise Shiner with admissions at [facility] and was informed that the patient discharged from the facility on 12/12/2020.  CSW inquired as to whether there was any reason that the patient could not return if needed and was informed that there were no outstanding issues, but that a new FL2 would be needed if SNF becomes the plan.  CSW inquired about the patient's SS income.  CSW was informed that Gunnar Fusi the business office manager would have more information about financials.  CSW attempted to contacted Gunnar Fusi with the business office at [facility] [phone]). There was no answer.  CSW left a VM requesting a returned call.   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   Gram-negative bacteremia 11/24/2020   Septic shock ([place]) 11/24/2020   Cellulitis of left leg 11/19/2020   Alcohol withdrawal syndrome ([place]) 11/19/2020   Hypokalemia 69/79/4801   Acute metabolic encephalopathy 65/53/7482   Sepsis ([place]) 11/14/2020   S/P BKA (below knee amputation) unilateral, left (HCC)    Neuropathic pain, leg, left    Gangrene ([place]) 07/31/2020   Gangrene of foot ([place])  07/14/2020   Ataxia    Alcohol abuse with alcohol-induced mood disorder ([place]) 11/10/2019   Alcoholism /alcohol abuse    Alcohol withdrawal syndrome, with delirium ([place]) 11/06/2019   Pressure injury of skin 05/25/2018   Rhabdomyolysis 05/20/2018   Hyponatremia 05/20/2018   Elevated troponin 05/20/2018   Moderate dehydration 05/20/2018   Alcohol use 05/20/2018   Influenza A 05/20/2018   Transaminitis 05/20/2018   PCP:  Patient, No Pcp Per (Inactive) Pharmacy:   [facility], [place] - [phone] [place] [address] Phone: [phone] Fax: [phone]     Social Determinants of Health ([place]) Interventions    Readmission Risk Interventions Readmission Risk Prevention Plan 11/25/2020 08/10/2020  Transportation Screening Complete Complete  PCP or Specialist Appt within 5-7 Days - Complete  PCP or Specialist Appt within 3-5 Days Complete -  Home Care Screening - Complete  Medication Review (RN CM) - Complete  HRI or Home Care Consult Complete -  Social Work Consult for [place] Planning/Counseling Complete -  Palliative Care Screening Not Applicable -  Medication Review (RN Care Manager) Complete -  Some recent data might be hidden

## 2021-03-22 NOTE — Consult Note (Signed)
WOC Nurse Consult Note: Reason for Consult:Left AKa, Staples removed in ED.  Right vascular wounds to right lower leg, high risk for amputation.  Left buttocks stage 2 pressure injury.   Wound type: vascular and pressure ETOH abuse and unhoused.  REcent left AKA and staples are removed.  Will paint with betadine daily.  RIght leg with purulent malodorous lesions to right dorsal foot, between toes and right lateral leg.  HIgh risk for amputation. Vascular team has consulted.  Pressure Injury POA: Yes Measurement: LEft AKA staple line, staples removed.   Right foot:  4 cm x 4 cm white devitalized tissue  devitalized tissue between toes and right lateral malleolus 2.5 cm x 2 cm  Wound bed: white devitalized tissue.  Drainage (amount, consistency, odor) minimal purulence.  Necrotic odor Periwound: dry skin Dressing procedure/placement/frequency: Cleanse left AKA site staple line area (staples have been removed) with NS and pat dry. Paint with betadine and leave open to air. Apply Xeroform gauze to wounds on right leg with NS and pat dry  Weave strips of Xeroform between toes.  COver with dry dressing an tape.  Change daily.  Will not follow at this time.  Please re-consult if needed.  Maple Hudson MSN, RN, FNP-BC CWON Wound, Ostomy, Continence Nurse Pager [phone]

## 2021-03-22 NOTE — Progress Notes (Incomplete Revision)
°  Progress Note    03/22/2021 8:52 AM * No surgery found *  Subjective:  complaining of lab draws   Vitals:   03/22/21 0600 03/22/21 0743  BP: (!) 96/56   Pulse: (!) 106   Resp:    Temp:  98.9 F (37.2 C)  SpO2: 92%    Physical Exam: Cardiac:  regular Lungs:  non labored Incisions: left AKA staples removed Extremities:  extensive right leg wounds. Dressings changed Neurologic: alert and oriented  CBC    Component Value Date/Time   WBC 4.7 03/21/2021 0712   RBC 3.64 (L) 03/21/2021 0712   HGB 10.0 (L) 03/21/2021 0712   HCT 30.0 (L) 03/21/2021 0712   HCT 36.4 (L) 05/20/2018 1923   PLT 63 (L) 03/21/2021 0712   MCV 82.4 03/21/2021 0712   MCH 27.5 03/21/2021 0712   MCHC 33.3 03/21/2021 0712   RDW 14.7 03/21/2021 0712   LYMPHSABS 0.6 (L) 03/19/2021 1704   MONOABS 0.2 03/19/2021 1704   EOSABS 0.0 03/19/2021 1704   BASOSABS 0.0 03/19/2021 1704    BMET    Component Value Date/Time   NA 133 (L) 03/22/2021 0119   K 2.9 (L) 03/21/2021 0152   CL 98 03/21/2021 0152   CO2 26 03/21/2021 0152   GLUCOSE 119 (H) 03/21/2021 0152   BUN <5 (L) 03/21/2021 0152   CREATININE 0.59 (L) 03/21/2021 0152   CALCIUM 7.3 (L) 03/21/2021 0152   GFRNONAA >60 03/21/2021 0152   GFRAA >60 11/12/2019 0359    INR    Component Value Date/Time   INR 0.9 03/19/2021 1704     Intake/Output Summary (Last 24 hours) at 03/22/2021 0852 Last data filed at 03/22/2021 0634 Gross per 24 hour  Intake 3395.62 ml  Output 6250 ml  Net -2854.38 ml     Assessment/Plan:  55 y.o. male with extensive RLE wounds  L AKA staples removed RLE wound dressings changed. Wound Care consulted for future wound changes Continue Metro/Vanc/Cefepime Off pressors  High risk for RLE amputation   Corrina Baglia, PA-C [facility] [phone] 03/22/2021 8:52 AM  I agree with the above.  I have seen and evaluated the patient.  I changed his dressings today.  I am still very concerned that  he is going to require an amputation of the right leg.  He strongly does not want this to happen.  I would recommend cleaning the leg with soap and water and then using Xeroform for wound coverage.  This can continue to be monitored to see if there is any chance of salvage of the leg.  He should remain on IV antibiotics.  I will continue to follow him while he is in the hospital and make decisions regarding limb salvage prior to discharge.  Wells Dewight Catino      

## 2021-03-22 NOTE — Progress Notes (Signed)
[facility] PROGRESS NOTE    Progress Note  Jake Nguyen  GBE:[id number] DOB: [birth year] DOA: 03/19/2021 PCP: Patient, No Pcp Per (Inactive)     Brief Narrative:   Jake Nguyen is an 55 y.o. 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   Significant studies: Echocardiogram 03/20/2021 >> EF 60-65% without regional wall motion abnormality.  Diastolic parameters indeterminate.  RV normal size and function  Antibiotics: 03/19/2021 IV vancomycin Flagyl and cefepime  Microbiology data: Blood culture:  Procedures: None  Assessment/Plan:   Septic shock due to right lower extremity wound and cellulitis/hypothermia:   Now off pressors and fluid resuscitated. Culture data has remained negative so far he remains on vancomycin cefepime and Flagyl. Vascular surgery was consulted who recommended to continue IV empiric antibiotics and that he remains high risk for amputation.  Acute metabolic encephalopathy likely due to sepsis, hyponatremia and hypothermia: CT of the head was negative. Neurological improved. UDS pending.  Acute on chronic hyponatremia: In the setting of alcoholic use, PR plus anemia osmolarity compatible with hypovolemia. He has been fluid resuscitated and sodium is improved to 133 134.  Hypokalemia: Repleted, will recheck this morning.  Chronic alcohol use: He reports drinking last drink was in 03/19/2021.  He does have a past medical history of DTs. Continue Ambien, thiamine and folate. Continue to monitor with CIWA  protocol.  History of right AKA: On 11/21/2020 vascular has been consulted and plans to remove staples.  Chronic thrombocytopenia: In the setting of alcohol use likely due to sepsis.  Homelessness/failure to thrive/protein caloric malnutrition: Child psychotherapist has been consulted for assistance.  History of PAD/history of hyperlipidemia: Likely noncompliance with his medication. Continue aspirin Lipitor.    Sepsis (HCC) Active Problems:   Septic shock (HCC)  RN Pressure Injury Documentation: Pressure Injury 11/25/20 Sacrum Mid;Upper Stage 2 -  Partial thickness loss of dermis presenting as a shallow open injury with a red, pink wound bed without slough. area of broken skin between buttocks just below sacrum. (Active)  11/25/20 0806  Location: Sacrum  Location Orientation: Mid;Upper  Staging: Stage 2 -  Partial thickness loss of dermis presenting as a shallow open injury with a red, pink wound bed without slough.  Wound Description (Comments): area of broken skin between buttocks just below sacrum.  Present on Admission:      Pressure Injury 03/19/21 Buttocks Left Stage 2 -  Partial thickness loss of dermis presenting as a shallow open injury with a red, pink wound bed without slough. pink red (Active)  03/19/21 2300  Location: Buttocks  Location Orientation: Left  Staging: Stage 2 -  Partial thickness loss of dermis presenting as a shallow open injury with a red, pink wound bed without slough.  Wound Description (Comments): pink red  Present on Admission: Yes     Pressure Injury 03/19/21 Buttocks Right Stage 2 -  Partial thickness loss of dermis presenting as a shallow open injury with a red, pink wound bed without slough. red, pink (Active)  03/19/21 2300  Location: Buttocks  Location Orientation: Right  Staging: Stage  2 -  Partial thickness loss of dermis presenting as a shallow open injury with a red, pink wound bed without slough.  Wound Description (Comments): red, pink   Present on Admission: Yes    Estimated body mass index is 20.69 kg/m as calculated from the following:   Height as of this encounter: 5\' 10"  (1.778 m).   Weight as of this encounter: 65.4 kg.   DVT prophylaxis: lovenox Family Communication:none Status is: Inpatient  Remains inpatient appropriate because: Sepsis with septic shock requiring pressors now probably will need a below the knee amputation        Code Status:     Code Status Orders  (From admission, onward)           Start     Ordered   03/19/21 2043  Full code  Continuous        03/19/21 2046           Code Status History     Date Active Date Inactive Code Status Order ID Comments User Context   11/19/2020 2226 11/27/2020 0224 Full Code [id number]  Chotiner, Claudean SeveranceBradley S, MD ED   11/19/2020 1702 11/19/2020 2226 Full Code [id number]  Olene Flossrosperi, Christian H, PA-C ED   07/31/2020 1312 08/27/2020 1921 Full Code [id number]  Emeline GeneralZhang, Ping T, MD ED   11/06/2019 1531 11/12/2019 2118 Full Code [id number]  Lavonda JumboAutry-Lott, Simone, DO ED   11/06/2019 1206 11/06/2019 1530 Full Code [id number]  Dartha LodgeFord, Kelsey N, PA-C ED   05/20/2018 1709 05/28/2018 2259 Full Code [id number]  Dimple NanasAmin, Ankit Chirag, MD ED   02/02/2016 1702 02/02/2016 2138 Full Code [id number]  Trixie DredgeWest, Emily, PA-C ED         IV Access:   Peripheral IV   Procedures and diagnostic studies:   ECHOCARDIOGRAM COMPLETE  Result Date: 03/20/2021    ECHOCARDIOGRAM REPORT   Patient Name:   Jake AlbeeDWIGHT L Nguyen Date of Exam: 03/20/2021 Medical Rec #:  [id number]        Height:       70.0 in Accession #:    [id number]       Weight:       146.2 lb Date of Birth:  04/15/1965         BSA:          1.827 m Patient Age:    55 years         BP:           98/68 mmHg Patient Gender: M                HR:           124 bpm. Exam Location:  Inpatient Procedure: 2D Echo, Cardiac Doppler and Color Doppler Indications:     Bacteremia  History:         Patient has prior history of Echocardiogram examinations,  most                  recent 05/24/2018. Arrythmias:Tachycardia. ETOH abuse. BKA.  Sonographer:     Roosvelt Maserachel Lane RDCS Referring Phys:  [id number] Eliezer ChampagneIMOTHY S GEORGE Diagnosing Phys: Orpah CobbAjay Kadakia MD IMPRESSIONS  1. Left ventricular ejection fraction, by estimation, is 60 to 65%. The left ventricle has normal function. The left ventricle has no regional wall motion abnormalities. Left ventricular diastolic parameters are indeterminate.  2. Right ventricular systolic function is normal. The right ventricular size is normal.  3. Left atrial size was mildly dilated.  4. Right atrial size was mildly  dilated.  5. The mitral valve is degenerative. Trivial mitral valve regurgitation.  6. The aortic valve is tricuspid. There is mild calcification of the aortic valve. There is mild thickening of the aortic valve. Aortic valve regurgitation is not visualized. Aortic valve sclerosis is present, with no evidence of aortic valve stenosis.  7. The inferior vena cava is normal in size with greater than 50% respiratory variability, suggesting right atrial pressure of 3 mmHg. FINDINGS  Left Ventricle: Left ventricular ejection fraction, by estimation, is 60 to 65%. The left ventricle has normal function. The left ventricle has no regional wall motion abnormalities. The left ventricular internal cavity size was normal in size. There is  no left ventricular hypertrophy. Left ventricular diastolic parameters are indeterminate. Right Ventricle: The right ventricular size is normal. No increase in right ventricular wall thickness. Right ventricular systolic function is normal. Left Atrium: Left atrial size was mildly dilated. Right Atrium: Right atrial size was mildly dilated. Pericardium: There is no evidence of pericardial effusion. Mitral Valve: The mitral valve is degenerative in appearance. There is mild thickening of the mitral valve leaflet(s). There is mild calcification of the mitral valve leaflet(s). Mild mitral annular calcification.  Trivial mitral valve regurgitation. Tricuspid Valve: The tricuspid valve is normal in structure. Tricuspid valve regurgitation is trivial. Aortic Valve: The aortic valve is tricuspid. There is mild calcification of the aortic valve. There is mild thickening of the aortic valve. Aortic valve regurgitation is not visualized. Aortic valve sclerosis is present, with no evidence of aortic valve stenosis. Pulmonic Valve: The pulmonic valve was grossly normal. Pulmonic valve regurgitation is not visualized. Aorta: The aortic root is normal in size and structure. Venous: The inferior vena cava is normal in size with greater than 50% respiratory variability, suggesting right atrial pressure of 3 mmHg. IAS/Shunts: The atrial septum is grossly normal.  LEFT VENTRICLE PLAX 2D LVIDd:         3.90 cm LVIDs:         2.50 cm LV PW:         0.90 cm LV IVS:        0.70 cm LVOT diam:     1.90 cm LV SV:         37 LV SV Index:   20 LVOT Area:     2.84 cm  RIGHT VENTRICLE RV Basal diam:  3.80 cm LEFT ATRIUM           Index        RIGHT ATRIUM           Index LA diam:      3.20 cm 1.75 cm/m   RA Area:     12.90 cm LA Vol (A4C): 40.4 ml 22.11 ml/m  RA Volume:   32.20 ml  17.63 ml/m  AORTIC VALVE LVOT Vmax:   101.00 cm/s LVOT Vmean:  67.400 cm/s LVOT VTI:    0.129 m  AORTA Ao Root diam: 3.10 cm Ao Asc diam:  3.10 cm  SHUNTS Systemic VTI:  0.13 m Systemic Diam: 1.90 cm Orpah Cobb MD Electronically signed by Orpah Cobb MD Signature Date/Time: 03/20/2021/4:41:24 PM    Final      Medical Consultants:   None.   Subjective:    Jake Nguyen no complaints tolerating his diet relates he is homeless  Objective:    Vitals:   03/22/21 0300 03/22/21 0400 03/22/21 0500 03/22/21 0600  BP: (!) 83/57 92/65 94/60  (!) 96/56  Pulse: 98 (!) 105 (!)  111 (!) 106  Resp:      Temp:      TempSrc:      SpO2: 95% 92% 90% 92%  Weight:      Height:       SpO2: 92 % O2 Flow Rate (L/min): 2 L/min   Intake/Output Summary (Last  24 hours) at 03/22/2021 0648 Last data filed at 03/22/2021 0634 Gross per 24 hour  Intake 3580.73 ml  Output 7050 ml  Net -3469.27 ml   Filed Weights   03/19/21 2029 03/20/21 0107 03/21/21 0500  Weight: 75 kg 66.3 kg 65.4 kg    Exam: General exam: In no acute distress. Respiratory system: Good air movement and clear to auscultation. Cardiovascular system: S1 & S2 heard, RRR. No JVD. Extremities: The right leg looks macerated shiny ulcers noted throughout the right lower extremity with erythema. Skin: No rashes, lesions or ulcers Psychiatry: Judgement and insight appear normal. Mood & affect appropriate.    Data Reviewed:    Labs: Basic Metabolic Panel: Recent Labs  Lab 03/19/21 1704 03/20/21 0007 03/20/21 0955 03/21/21 0152 03/21/21 0154 03/21/21 0712 03/21/21 1149 03/21/21 1815 03/22/21 0119  NA 126* 130*   < > 133* 134* 134* 133* 133* 133*  K 3.7 3.0*  --  2.9*  --   --   --   --   --   CL 88* 94*  --  98  --   --   --   --   --   CO2 26 26  --  26  --   --   --   --   --   GLUCOSE 100* 91  --  119*  --   --   --   --   --   BUN <5* <5*  --  <5*  --   --   --   --   --   CREATININE <0.30* 0.41*  --  0.59*  --   --   --   --   --   CALCIUM 7.9* 7.1*  --  7.3*  --   --   --   --   --   MG  --  1.5*  --  1.9  --   --   --   --   --   PHOS  --  3.2  --   --   --   --   --   --   --    < > = values in this interval not displayed.   GFR Estimated Creatinine Clearance: 98.8 mL/min (A) (by C-G formula based on SCr of 0.59 mg/dL (L)). Liver Function Tests: Recent Labs  Lab 03/19/21 1704  AST 81*  ALT 40  ALKPHOS 113  BILITOT 0.6  PROT 6.4*  ALBUMIN 2.1*   No results for input(s): LIPASE, AMYLASE in the last 168 hours. Recent Labs  Lab 03/19/21 1732 03/20/21 0007  AMMONIA 44* 28   Coagulation profile Recent Labs  Lab 03/19/21 1704  INR 0.9   COVID-19 Labs  No results for input(s): DDIMER, FERRITIN, LDH, CRP in the last 72 hours.  Lab Results   Component Value Date   SARSCOV2NAA NEGATIVE 03/19/2021   SARSCOV2NAA NEGATIVE 11/25/2020   SARSCOV2NAA NEGATIVE 11/19/2020   [place] NEGATIVE 11/14/2020    CBC: Recent Labs  Lab 03/19/21 1704 03/20/21 0007 03/21/21 0712  WBC 3.4* 3.8* 4.7  NEUTROABS 2.6  --   --   HGB 12.8* 10.9* 10.0*  HCT  37.3* 31.5* 30.0*  MCV 81.1 80.4 82.4  PLT 115* 134* 63*   Cardiac Enzymes: Recent Labs  Lab 03/20/21 0007  CKTOTAL 114   BNP (last 3 results) No results for input(s): PROBNP in the last 8760 hours. CBG: Recent Labs  Lab 03/21/21 0821 03/21/21 1149 03/21/21 1744 03/21/21 1936 03/22/21 0335  GLUCAP 138* 113* 105* 146* 93   D-Dimer: No results for input(s): DDIMER in the last 72 hours. Hgb A1c: No results for input(s): HGBA1C in the last 72 hours. Lipid Profile: No results for input(s): CHOL, HDL, LDLCALC, TRIG, CHOLHDL, LDLDIRECT in the last 72 hours. Thyroid function studies: Recent Labs    03/19/21 1837  TSH 0.357   Anemia work up: No results for input(s): VITAMINB12, FOLATE, FERRITIN, TIBC, IRON, RETICCTPCT in the last 72 hours. Sepsis Labs: Recent Labs  Lab 03/19/21 1651 03/19/21 1704 03/20/21 0007 03/21/21 0152 03/21/21 0712  PROCALCITON  --   --  <0.10 0.18  --   WBC  --  3.4* 3.8*  --  4.7  LATICACIDVEN 2.0*  --  2.1*  --   --    Microbiology Recent Results (from the past 240 hour(s))  Resp Panel by RT-PCR (Flu A&B, Covid) Nasopharyngeal Swab     Status: None   Collection Time: 03/19/21  4:54 PM   Specimen: Nasopharyngeal Swab; Nasopharyngeal(NP) swabs in vial transport medium  Result Value Ref Range Status   SARS Coronavirus 2 by RT PCR NEGATIVE NEGATIVE Final    Comment: (NOTE) SARS-CoV-2 target nucleic acids are NOT DETECTED.  The SARS-CoV-2 RNA is generally detectable in upper respiratory specimens during the acute phase of infection. The lowest concentration of SARS-CoV-2 viral copies this assay can detect is 138 copies/mL. A negative  result does not preclude SARS-Cov-2 infection and should not be used as the sole basis for treatment or other patient management decisions. A negative result may occur with  improper specimen collection/handling, submission of specimen other than nasopharyngeal swab, presence of viral mutation(s) within the areas targeted by this assay, and inadequate number of viral copies(<138 copies/mL). A negative result must be combined with clinical observations, patient history, and epidemiological information. The expected result is Negative.  Fact Sheet for Patients:  EntrepreneurPulse.com.au  Fact Sheet for Healthcare Providers:  IncredibleEmployment.be  This test is no t yet approved or cleared by the [place] FDA and  has been authorized for detection and/or diagnosis of SARS-CoV-2 by FDA under an Emergency Use Authorization (EUA). This EUA will remain  in effect (meaning this test can be used) for the duration of the COVID-19 declaration under Section 564(b)(1) of the Act, 21 U.S.C.section 360bbb-3(b)(1), unless the authorization is terminated  or revoked sooner.       Influenza A by PCR NEGATIVE NEGATIVE Final   Influenza B by PCR NEGATIVE NEGATIVE Final    Comment: (NOTE) The Xpert Xpress SARS-CoV-2/FLU/RSV plus assay is intended as an aid in the diagnosis of influenza from Nasopharyngeal swab specimens and should not be used as a sole basis for treatment. Nasal washings and aspirates are unacceptable for Xpert Xpress SARS-CoV-2/FLU/RSV testing.  Fact Sheet for Patients: EntrepreneurPulse.com.au  Fact Sheet for Healthcare Providers: IncredibleEmployment.be  This test is not yet approved or cleared by the [place] FDA and has been authorized for detection and/or diagnosis of SARS-CoV-2 by FDA under an Emergency Use Authorization (EUA). This EUA will remain in effect (meaning this test can be used)  for the duration of the COVID-19 declaration under Section 564(b)(1)  of the Act, 21 U.S.C. section 360bbb-3(b)(1), unless the authorization is terminated or revoked.  Performed at [facility], [place] [address] [place], [place] [address]   Culture, blood (Routine x 2)     Status: None (Preliminary result)   Collection Time: 03/19/21  5:00 PM   Specimen: BLOOD LEFT FOREARM  Result Value Ref Range Status   Specimen Description BLOOD LEFT FOREARM  Final   Special Requests   Final    BOTTLES DRAWN AEROBIC AND ANAEROBIC Blood Culture results may not be optimal due to an inadequate volume of blood received in culture bottles   Culture   Final    NO GROWTH 2 DAYS Performed at [facility], [place] [address], [place], [place] [address]    Report Status PENDING  Incomplete  Culture, blood (Routine x 2)     Status: None (Preliminary result)   Collection Time: 03/19/21  5:02 PM   Specimen: BLOOD  Result Value Ref Range Status   Specimen Description BLOOD BLOOD RIGHT HAND  Final   Special Requests   Final    BOTTLES DRAWN AEROBIC AND ANAEROBIC Blood Culture results may not be optimal due to an inadequate volume of blood received in culture bottles   Culture   Final    NO GROWTH 2 DAYS Performed at [facility], [place] [address], [place], [place] [address]    Report Status PENDING  Incomplete  Urine Culture     Status: None   Collection Time: 03/19/21  8:43 PM   Specimen: Urine, Clean Catch  Result Value Ref Range Status   Specimen Description URINE, CLEAN CATCH  Final   Special Requests NONE  Final   Culture   Final    NO GROWTH Performed at [facility], [place] [address] [place], [place] [address]    Report Status 03/21/2021 FINAL  Final  MRSA Next Gen by PCR, Nasal     Status: None   Collection Time: 03/19/21  9:11 PM   Specimen: Nasal Mucosa; Nasal Swab  Result Value Ref Range Status   MRSA by PCR Next Gen NOT DETECTED NOT DETECTED Final    Comment: (NOTE) The  GeneXpert MRSA Assay (FDA approved for NASAL specimens only), is one component of a comprehensive MRSA colonization surveillance program. It is not intended to diagnose MRSA infection nor to guide or monitor treatment for MRSA infections. Test performance is not FDA approved in patients less than 68 years old. Performed at [facility], [place] [address], [place], [place] [address]      Medications:    Chlorhexidine Gluconate Cloth  6 each Topical Daily   folic acid  1 mg Oral Daily   heparin  5,000 Units Subcutaneous Q8H   multivitamin with minerals  1 tablet Oral Daily   pantoprazole (PROTONIX) IV  40 mg Intravenous QHS   thiamine  100 mg Oral Daily   Or   thiamine  100 mg Intravenous Daily   Continuous Infusions:  sodium chloride Stopped (03/22/21 0526)   ceFEPime (MAXIPIME) IV Stopped (03/22/21 0500)   dextrose 5 % and 0.9% NaCl 75 mL/hr at 03/22/21 0600   metronidazole Stopped (03/22/21 0142)   norepinephrine (LEVOPHED) Adult infusion Stopped (03/21/21 1158)   vancomycin 200 mL/hr at 03/22/21 0600      LOS: 3 days   Charlynne Cousins  [facility]  03/22/2021, 6:48 AM

## 2021-03-23 LAB — BASIC METABOLIC PANEL
Anion gap: 7 (ref 5–15)
BUN: <5 mg/dL — ABNORMAL LOW (ref 6–20)
CO2: 26 mmol/L (ref 22–32)
Calcium: 7.8 mg/dL — ABNORMAL LOW (ref 8.9–10.3)
Chloride: 98 mmol/L (ref 98–111)
Creatinine, Ser: 0.53 mg/dL — ABNORMAL LOW (ref 0.61–1.24)
GFR, Estimated: >60 mL/min (ref >60)
Glucose, Bld: 109 mg/dL — ABNORMAL HIGH (ref 70–99)
Potassium: 3.8 mmol/L (ref 3.5–5.1)
Sodium: 131 mmol/L — ABNORMAL LOW (ref 135–145)

## 2021-03-23 LAB — CBC
HCT: 29.5 % — ABNORMAL LOW (ref 39.0–52.0)
Hemoglobin: 9.7 g/dL — ABNORMAL LOW (ref 13.0–17.0)
MCH: 27.6 pg (ref 26.0–34.0)
MCHC: 32.9 g/dL (ref 30.0–36.0)
MCV: 83.8 fL (ref 80.0–100.0)
Platelets: 37 10*3/uL — ABNORMAL LOW (ref 150–400)
RBC: 3.52 MIL/uL — ABNORMAL LOW (ref 4.22–5.81)
RDW: 14.6 % (ref 11.5–15.5)
WBC: 5.3 10*3/uL (ref 4.0–10.5)
nRBC: 0 % (ref 0.0–0.2)

## 2021-03-23 LAB — RAPID URINE DRUG SCREEN, HOSP PERFORMED
Amphetamines: NOT DETECTED
Barbiturates: NOT DETECTED
Benzodiazepines: NOT DETECTED
Cocaine: NOT DETECTED
Opiates: NOT DETECTED
Tetrahydrocannabinol: NOT DETECTED

## 2021-03-23 LAB — GLUCOSE, CAPILLARY: Glucose-Capillary: 174 mg/dL — ABNORMAL HIGH (ref 70–99)

## 2021-03-23 MED ORDER — SODIUM CHLORIDE 0.9% FLUSH: 3.0000 mL | INTRAVENOUS | Status: DC | PRN

## 2021-03-23 MED ORDER — MAGNESIUM OXIDE -MG SUPPLEMENT 400 (240 MG) MG PO TABS
400.0000 mg | ORAL_TABLET | Freq: Two times a day (BID) | ORAL | Status: AC
  Administered 2021-03-23 (×2): 400 mg via ORAL
  Filled 2021-03-23 (×2): qty 1

## 2021-03-23 MED ORDER — SODIUM CHLORIDE 0.9% FLUSH: 3.0000 mL | Freq: Two times a day (BID) | INTRAVENOUS | Status: DC

## 2021-03-23 MED ORDER — SODIUM CHLORIDE 0.9 % IV SOLN: 250.0000 mL | INTRAVENOUS | Status: DC | PRN

## 2021-03-23 NOTE — Progress Notes (Addendum)
[facility] PROGRESS NOTE    Progress Note  Rosealee AlbeeDwight L Kirschenmann  [name]:[id number] DOB: 06/04/1965 DOA: 03/19/2021 PCP: Patient, No Pcp Per (Inactive)     Brief Narrative:   Rosealee AlbeeDwight L Morning is an 55 y.o. 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  Admitted to pulmonary and critical care as needed pressors which now has been weaned off.   Significant studies: Echocardiogram 03/20/2021 >> EF 60-65% without regional wall motion abnormality.  Diastolic parameters indeterminate.  RV normal size and function  Antibiotics: 03/19/2021 IV vancomycin Flagyl and cefepime  Microbiology data: Blood culture:  Procedures: None  Assessment/Plan:   Septic shock due to right lower extremity wound and cellulitis/hypothermia:   To date has remained negative so far, vascular surgery was consulted recommended to continue IV vancomycin, cefepime and Flagyl.  Deemed him high risk for amputation.   Awaiting further recommendations. Will have to involve social worker due to his homelessness he is at high risk of readmission.  Acute metabolic encephalopathy likely due to sepsis, hyponatremia and hypothermia: CT of the head was negative. Neurological improved. UDS pending.  Acute on chronic hyponatremia: In the setting of alcoholic use, likely due to hypovolemia.  And alcohol use.  Hypokalemia: Repleted, will recheck this morning.  Chronic alcohol use: He reports drinking last drink was in 03/19/2021.  He does have a past medical history of DTs. Continue Ambien,  thiamine and folate. Continue to monitor with CIWA protocol.  History of right AKA: On 11/21/2020 vascular has been consulted and plans to remove staples.  Chronic thrombocytopenia: In the setting of alcohol use likely due to sepsis.  Homelessness/failure to thrive/protein caloric malnutrition: Child psychotherapistocial worker has been consulted for assistance.  History of PAD/history of hyperlipidemia: Likely noncompliance with his medication. Continue aspirin Lipitor.  Acute thrombocytopenia: Hold heparin, check HIT panel .  Severe protein caloric malnutrition: Ensure 3 times daily.  Sacral decubitus ulcer stage II present on admission: RN Pressure Injury Documentation: Pressure Injury 11/25/20 Sacrum Mid;Upper Stage 2 -  Partial thickness loss of dermis presenting as a shallow open injury with a red, pink wound bed without slough. area of broken skin between buttocks just below sacrum. (Active)  11/25/20 0806  Location: Sacrum  Location Orientation: Mid;Upper  Staging: Stage 2 -  Partial thickness loss of dermis presenting as a shallow open injury with a red, pink wound bed without slough.  Wound Description (Comments): area of broken skin between buttocks just below sacrum.  Present on Admission:      Pressure Injury 03/19/21 Buttocks Left Stage 2 -  Partial thickness loss of dermis presenting as a shallow open injury with a red, pink wound bed without slough. pink red (Active)  03/19/21 2300  Location: Buttocks  Location Orientation: Left  Staging: Stage 2 -  Partial thickness loss of dermis presenting as a shallow open injury with a red, pink wound bed without slough.  Wound Description (Comments): pink red  Present on Admission: Yes     Pressure Injury 03/19/21 Buttocks Right Stage 2 -  Partial thickness loss of dermis presenting as  a shallow open injury with a red, pink wound bed without slough. red, pink (Active)  03/19/21 2300  Location: Buttocks  Location Orientation: Right   Staging: Stage 2 -  Partial thickness loss of dermis presenting as a shallow open injury with a red, pink wound bed without slough.  Wound Description (Comments): red, pink  Present on Admission: Yes    Estimated body mass index is 21.38 kg/m as calculated from the following:   Height as of this encounter: 5\' 10"  (1.778 m).   Weight as of this encounter: 67.6 kg.   DVT prophylaxis: lovenox Family Communication:none Status is: Inpatient  Remains inpatient appropriate because: Sepsis with septic shock requiring pressors now probably will need a below the knee amputation     Code Status:     Code Status Orders  (From admission, onward)           Start     Ordered   03/19/21 2043  Full code  Continuous        03/19/21 2046           Code Status History     Date Active Date Inactive Code Status Order ID Comments User Context   11/19/2020 2226 11/27/2020 0224 Full Code [id number]  Chotiner, Yevonne Aline, MD ED   11/19/2020 1702 11/19/2020 2226 Full Code [id number]  Anselmo Pickler, PA-C ED   07/31/2020 1312 08/27/2020 1921 Full Code [id number]  Lequita Halt, MD ED   11/06/2019 1531 11/12/2019 2118 Full Code [id number]  Gerlene Fee, DO ED   11/06/2019 1206 11/06/2019 1530 Full Code [id number]  Jacqlyn Larsen, PA-C ED   05/20/2018 1709 05/28/2018 2259 Full Code [id number]  Damita Lack, MD ED   02/02/2016 1702 02/02/2016 2138 Full Code [id number]  Clayton Bibles, PA-C ED         IV Access:   Peripheral IV   Procedures and diagnostic studies:   No results found.   Medical Consultants:   None.   Subjective:    Jocelyn Lamer no complaints this morning.  Objective:    Vitals:   03/22/21 0907 03/22/21 1623 03/22/21 2043 03/23/21 0541  BP: 111/70 105/79 101/67 91/61  Pulse: (!) 104 83 95 99  Resp: 16  18 18   Temp: 98.2 F (36.8 C) 98.2 F (36.8 C) 98 F (36.7 C) 98.4 F (36.9 C)  TempSrc: Oral Oral  Oral  SpO2: 98% 98% 97% 100%  Weight:     67.6 kg  Height:       SpO2: 100 % O2 Flow Rate (L/min): 2 L/min   Intake/Output Summary (Last 24 hours) at 03/23/2021 0813 Last data filed at 03/23/2021 0600 Gross per 24 hour  Intake 1030.67 ml  Output 2600 ml  Net -1569.33 ml    Filed Weights   03/20/21 0107 03/21/21 0500 03/23/21 0541  Weight: 66.3 kg 65.4 kg 67.6 kg    Exam: General exam: In no acute distress. Respiratory system: Good air movement and clear to auscultation. Cardiovascular system: S1 & S2 heard, RRR. No JVD. Gastrointestinal system: Abdomen is nondistended, soft and nontender.  Psychiatry: Judgement and insight appear normal. Mood & affect appropriate. Extremities: The right leg looks macerated shiny ulcers noted throughout the right lower extremity with erythema.     Data Reviewed:    Labs: Basic Metabolic Panel: Recent Labs  Lab 03/19/21 1704 03/20/21 0007 03/20/21 0955 03/21/21 0152 03/21/21 0154 03/22/21 0758 03/22/21 1016 03/22/21 1411 03/22/21 2010 03/23/21 0613  NA 126*  130*   < > 133*   < > 135 133* 133* 134* 131*  K 3.7 3.0*  --  2.9*  --   --  3.6  --   --  3.8  CL 88* 94*  --  98  --   --  99  --   --  98  CO2 26 26  --  26  --   --  27  --   --  26  GLUCOSE 100* 91  --  119*  --   --  102*  --   --  109*  BUN <5* <5*  --  <5*  --   --  <5*  --   --  <5*  CREATININE <0.30* 0.41*  --  0.59*  --   --  0.61  --   --  0.53*  CALCIUM 7.9* 7.1*  --  7.3*  --   --  7.7*  --   --  7.8*  MG  --  1.5*  --  1.9  --   --  1.6*  --   --   --   PHOS  --  3.2  --   --   --   --   --   --   --   --    < > = values in this interval not displayed.    GFR Estimated Creatinine Clearance: 102.1 mL/min (A) (by C-G formula based on SCr of 0.53 mg/dL (L)). Liver Function Tests: Recent Labs  Lab 03/19/21 1704  AST 81*  ALT 40  ALKPHOS 113  BILITOT 0.6  PROT 6.4*  ALBUMIN 2.1*    No results for input(s): LIPASE, AMYLASE in the last 168 hours. Recent Labs  Lab 03/19/21 1732  03/20/21 0007  AMMONIA 44* 28    Coagulation profile Recent Labs  Lab 03/19/21 1704  INR 0.9    COVID-19 Labs  No results for input(s): DDIMER, FERRITIN, LDH, CRP in the last 72 hours.  Lab Results  Component Value Date   SARSCOV2NAA NEGATIVE 03/19/2021   SARSCOV2NAA NEGATIVE 11/25/2020   [place] NEGATIVE 11/19/2020   [place] NEGATIVE 11/14/2020    CBC: Recent Labs  Lab 03/19/21 1704 03/20/21 0007 03/21/21 0712 03/23/21 0613  WBC 3.4* 3.8* 4.7 5.3  NEUTROABS 2.6  --   --   --   HGB 12.8* 10.9* 10.0* 9.7*  HCT 37.3* 31.5* 30.0* 29.5*  MCV 81.1 80.4 82.4 83.8  PLT 115* 134* 63* 37*    Cardiac Enzymes: Recent Labs  Lab 03/20/21 0007 03/22/21 1016  CKTOTAL 114 132    BNP (last 3 results) No results for input(s): PROBNP in the last 8760 hours. CBG: Recent Labs  Lab 03/22/21 0741 03/22/21 1146 03/22/21 1616 03/22/21 2042 03/23/21 0004  GLUCAP 106* 103* 137* 114* 174*    D-Dimer: No results for input(s): DDIMER in the last 72 hours. Hgb A1c: No results for input(s): HGBA1C in the last 72 hours. Lipid Profile: No results for input(s): CHOL, HDL, LDLCALC, TRIG, CHOLHDL, LDLDIRECT in the last 72 hours. Thyroid function studies: No results for input(s): TSH, T4TOTAL, T3FREE, THYROIDAB in the last 72 hours.  Invalid input(s): FREET3  Anemia work up: No results for input(s): VITAMINB12, FOLATE, FERRITIN, TIBC, IRON, RETICCTPCT in the last 72 hours. Sepsis Labs: Recent Labs  Lab 03/19/21 1651 03/19/21 1704 03/20/21 0007 03/21/21 0152 03/21/21 0712 03/23/21 [id number]  PROCALCITON  --   --  <0.10 0.18  --   --  WBC  --  3.4* 3.8*  --  4.7 5.3  LATICACIDVEN 2.0*  --  2.1*  --   --   --     Microbiology Recent Results (from the past 240 hour(s))  Resp Panel by RT-PCR (Flu A&B, Covid) Nasopharyngeal Swab     Status: None   Collection Time: 03/19/21  4:54 PM   Specimen: Nasopharyngeal Swab; Nasopharyngeal(NP) swabs in vial transport medium   Result Value Ref Range Status   SARS Coronavirus 2 by RT PCR NEGATIVE NEGATIVE Final    Comment: (NOTE) SARS-CoV-2 target nucleic acids are NOT DETECTED.  The SARS-CoV-2 RNA is generally detectable in upper respiratory specimens during the acute phase of infection. The lowest concentration of SARS-CoV-2 viral copies this assay can detect is 138 copies/mL. A negative result does not preclude SARS-Cov-2 infection and should not be used as the sole basis for treatment or other patient management decisions. A negative result may occur with  improper specimen collection/handling, submission of specimen other than nasopharyngeal swab, presence of viral mutation(s) within the areas targeted by this assay, and inadequate number of viral copies(<138 copies/mL). A negative result must be combined with clinical observations, patient history, and epidemiological information. The expected result is Negative.  Fact Sheet for Patients:  BloggerCourse.com  Fact Sheet for Healthcare Providers:  SeriousBroker.it  This test is no t yet approved or cleared by the [place] FDA and  has been authorized for detection and/or diagnosis of SARS-CoV-2 by FDA under an Emergency Use Authorization (EUA). This EUA will remain  in effect (meaning this test can be used) for the duration of the COVID-19 declaration under Section 564(b)(1) of the Act, 21 U.S.C.section 360bbb-3(b)(1), unless the authorization is terminated  or revoked sooner.       Influenza A by PCR NEGATIVE NEGATIVE Final   Influenza B by PCR NEGATIVE NEGATIVE Final    Comment: (NOTE) The Xpert Xpress SARS-CoV-2/FLU/RSV plus assay is intended as an aid in the diagnosis of influenza from Nasopharyngeal swab specimens and should not be used as a sole basis for treatment. Nasal washings and aspirates are unacceptable for Xpert Xpress SARS-CoV-2/FLU/RSV testing.  Fact Sheet for  Patients: BloggerCourse.com  Fact Sheet for Healthcare Providers: SeriousBroker.it  This test is not yet approved or cleared by the [place] FDA and has been authorized for detection and/or diagnosis of SARS-CoV-2 by FDA under an Emergency Use Authorization (EUA). This EUA will remain in effect (meaning this test can be used) for the duration of the COVID-19 declaration under Section 564(b)(1) of the Act, 21 U.S.C. section 360bbb-3(b)(1), unless the authorization is terminated or revoked.  Performed at [facility], [address] [place], [place] [address]   Culture, blood (Routine x 2)     Status: None (Preliminary result)   Collection Time: 03/19/21  5:00 PM   Specimen: BLOOD LEFT FOREARM  Result Value Ref Range Status   Specimen Description BLOOD LEFT FOREARM  Final   Special Requests   Final    BOTTLES DRAWN AEROBIC AND ANAEROBIC Blood Culture results may not be optimal due to an inadequate volume of blood received in culture bottles   Culture   Final    NO GROWTH 3 DAYS Performed at [facility], [address] [place], [place] [address]    Report Status PENDING  Incomplete  Culture, blood (Routine x 2)     Status: None (Preliminary result)   Collection Time: 03/19/21  5:02 PM   Specimen:  BLOOD  Result Value Ref Range Status   Specimen Description BLOOD BLOOD RIGHT HAND  Final   Special Requests   Final    BOTTLES DRAWN AEROBIC AND ANAEROBIC Blood Culture results may not be optimal due to an inadequate volume of blood received in culture bottles   Culture   Final    NO GROWTH 3 DAYS Performed at [facility], [address], [place], [place] [address]    Report Status PENDING  Incomplete  Urine Culture     Status: None   Collection Time: 03/19/21  8:43 PM   Specimen: Urine, Clean Catch  Result Value Ref Range Status   Specimen Description URINE, CLEAN CATCH  Final   Special Requests NONE   Final   Culture   Final    NO GROWTH Performed at [facility], [address] [place], [place] [address]    Report Status 03/21/2021 FINAL  Final  MRSA Next Gen by PCR, Nasal     Status: None   Collection Time: 03/19/21  9:11 PM   Specimen: Nasal Mucosa; Nasal Swab  Result Value Ref Range Status   MRSA by PCR Next Gen NOT DETECTED NOT DETECTED Final    Comment: (NOTE) The GeneXpert MRSA Assay (FDA approved for NASAL specimens only), is one component of a comprehensive MRSA colonization surveillance program. It is not intended to diagnose MRSA infection nor to guide or monitor treatment for MRSA infections. Test performance is not FDA approved in patients less than 66 years old. Performed at [facility], [address], [place], [place] [address]      Medications:    Chlorhexidine Gluconate Cloth  6 each Topical Daily   folic acid  1 mg Oral Daily   heparin  5,000 Units Subcutaneous Q8H   multivitamin with minerals  1 tablet Oral Daily   pantoprazole (PROTONIX) IV  40 mg Intravenous QHS   thiamine  100 mg Oral Daily   Or   thiamine  100 mg Intravenous Daily   Continuous Infusions:  sodium chloride Stopped (03/22/21 0526)   ceFEPime (MAXIPIME) IV 2 g (03/23/21 0235)   dextrose 5 % and 0.9% NaCl 10 mL/hr at 03/22/21 1656   metronidazole 500 mg (03/22/21 2222)   vancomycin 1,000 mg (03/23/21 0345)      LOS: 4 days   Marinda Elk  [facility]  03/23/2021, 8:13 AM

## 2021-03-23 NOTE — Progress Notes (Signed)
Patient dressing on RLE is soiled and unwrapping, does not want me to redo the dressing, or cover the open areas up.  Will continue to monitor.  Jean Rosenthal, RN

## 2021-03-23 NOTE — Progress Notes (Addendum)
°  Progress Note    03/23/2021 10:39 AM * No surgery found *  Subjective:  denies fevers, chills, N/V.  No change in RLE overnight   Vitals:   03/23/21 0541 03/23/21 0913  BP: 91/61 96/60  Pulse: 99 92  Resp: 18 16  Temp: 98.4 F (36.9 C) 98.2 F (36.8 C)  SpO2: 100% 95%   Physical Exam: Lungs:  non labored Extremities:  R leg dressing recently changed by RN staff Neurologic: A&O  CBC    Component Value Date/Time   WBC 5.3 03/23/2021 0613   RBC 3.52 (L) 03/23/2021 0613   HGB 9.7 (L) 03/23/2021 0613   HCT 29.5 (L) 03/23/2021 0613   HCT 36.4 (L) 05/20/2018 1923   PLT 37 (L) 03/23/2021 0613   MCV 83.8 03/23/2021 0613   MCH 27.6 03/23/2021 0613   MCHC 32.9 03/23/2021 0613   RDW 14.6 03/23/2021 0613   LYMPHSABS 0.6 (L) 03/19/2021 1704   MONOABS 0.2 03/19/2021 1704   EOSABS 0.0 03/19/2021 1704   BASOSABS 0.0 03/19/2021 1704    BMET    Component Value Date/Time   NA 131 (L) 03/23/2021 0613   K 3.8 03/23/2021 0613   CL 98 03/23/2021 0613   CO2 26 03/23/2021 0613   GLUCOSE 109 (H) 03/23/2021 0613   BUN <5 (L) 03/23/2021 0613   CREATININE 0.53 (L) 03/23/2021 0613   CALCIUM 7.8 (L) 03/23/2021 0613   GFRNONAA >60 03/23/2021 0613   GFRAA >60 11/12/2019 0359    INR    Component Value Date/Time   INR 0.9 03/19/2021 1704     Intake/Output Summary (Last 24 hours) at 03/23/2021 1039 Last data filed at 03/23/2021 0600 Gross per 24 hour  Intake 1030.67 ml  Output 2600 ml  Net -1569.33 ml     Assessment/Plan:  55 y.o. male with extensive RLE wounds  RLE dressings changed this morning.  Cleanse with soap and water daily.  Apply xeroform to wounds and wrap leg with dry dressing Continue IV antibiotics Therapy to train patient to use crutches Patient still adamantly against RLE amputation.  We will continue to follow   Jake Rutter, PA-C [facility] [phone] 03/23/2021 10:39 AM   I agree with the above.  Patient refusing  AKA  Jake Nguyen

## 2021-03-23 NOTE — Consult Note (Signed)
° °  [facility] Inpatient Consult   03/23/2021  Jake Nguyen [birth year] [id number]  Managed Medicaid: [facility]  Primary Care Provider:  Patient, No Pcp Per (Inactive)   Patient screened for hospitalization with noted high risk score for unplanned readmission risk and  to assess for potential [facility]  [[facility]] Care Management service needs for post hospital transition.  Came by to speak with patient however, after call out to him at the bedside, he did not acknowledge this Clinical research associate at this time. Asleep .  Will follow up regarding post hospital needs to refer to the Managed Medicaid team.  Plan:  Continue to follow progress and disposition to assess for post hospital care management needs.    For questions contact:   Charlesetta Shanks, RN BSN CCM [facility]  [phone] business mobile phone Toll free office [phone]  Fax number: [phone] [email or web address]

## 2021-03-24 LAB — CULTURE, BLOOD (ROUTINE X 2)
Culture: NO GROWTH
Culture: NO GROWTH

## 2021-03-24 LAB — CBC
HCT: 30.1 % — ABNORMAL LOW (ref 39.0–52.0)
Hemoglobin: 10 g/dL — ABNORMAL LOW (ref 13.0–17.0)
MCH: 27.6 pg (ref 26.0–34.0)
MCHC: 33.2 g/dL (ref 30.0–36.0)
MCV: 83.1 fL (ref 80.0–100.0)
Platelets: 56 10*3/uL — ABNORMAL LOW (ref 150–400)
RBC: 3.62 MIL/uL — ABNORMAL LOW (ref 4.22–5.81)
RDW: 14.3 % (ref 11.5–15.5)
WBC: 5.9 10*3/uL (ref 4.0–10.5)
nRBC: 0 % (ref 0.0–0.2)

## 2021-03-24 LAB — HEPARIN INDUCED PLATELET AB (HIT ANTIBODY): Heparin Induced Plt Ab: 0.191 OD (ref 0.000–0.400)

## 2021-03-24 NOTE — Evaluation (Signed)
Physical Therapy Evaluation Patient Details Name: Jake Nguyen MRN: [id number] DOB: 08/05/1965 Today's Date: 03/24/2021  History of Present Illness  Pt is a 55 y.o. M who presents with AMS 03/19/2021. Found to be hypothermic, septic shock due to RLE wound and cellulitis. Vascular surgery recommended amputation, which the patient has refused. Significant PMH: alcohol abuse, L AKA.  Clinical Impression  PTA, pt is homeless and uses a wheelchair. Pt reports he does not know where his wheelchair is. Pt verbalizing RLE pain and numbness. Performed squat pivot to and from bedside commode with heavy moderate assist. He is unable to stand. Pt presents with generalized weakness, balance deficits, sensory impairments, decreased skin integrity. Recommend SNF at discharge.      Recommendations for follow up therapy are one component of a multi-disciplinary discharge planning process, led by the attending physician.  Recommendations may be updated based on patient status, additional functional criteria and insurance authorization.  Follow Up Recommendations Skilled nursing-short term rehab (<3 hours/day)    Assistance Recommended at Discharge Frequent or constant Supervision/Assistance  Functional Status Assessment Patient has had a recent decline in their functional status and/or demonstrates limited ability to make significant improvements in function in a reasonable and predictable amount of time   Equipment Recommendations  Wheelchair (measurements PT);Wheelchair cushion (measurements PT)    Recommendations for Other Services       Precautions / Restrictions Precautions Precautions: Fall;Other (comment) Precaution Comments: L AKA Restrictions Weight Bearing Restrictions: No      Mobility  Bed Mobility Overal bed mobility: Needs Assistance Bed Mobility: Supine to Sit;Sit to Supine;Rolling Rolling: Supervision   Supine to sit: Supervision Sit to supine: Min assist   General bed  mobility comments: Not requiring physical assist to sit up edge of bed. MinA for RLE negotiation back into bed    Transfers Overall transfer level: Needs assistance   Transfers: Bed to chair/wheelchair/BSC       Squat pivot transfers: Mod assist     General transfer comment: Heavy modA for squat pivot to and from [facility]    Ambulation/Gait                  Stairs            Wheelchair Mobility    Modified Rankin (Stroke Patients Only)       Balance Overall balance assessment: Needs assistance Sitting-balance support: Feet unsupported Sitting balance-Leahy Scale: Fair                                       Pertinent Vitals/Pain Pain Assessment: Faces Faces Pain Scale: Hurts even more Pain Location: RLE Pain Descriptors / Indicators: Numbness Pain Intervention(s): Monitored during session;Limited activity within patient's tolerance    Home Living Family/patient expects to be discharged to:: Shelter/Homeless                        Prior Function Prior Level of Function : Independent/Modified Independent             Mobility Comments: pt homeless, using w/c       Hand Dominance   Dominant Hand: Right    Extremity/Trunk Assessment   Upper Extremity Assessment Upper Extremity Assessment: Generalized weakness    Lower Extremity Assessment Lower Extremity Assessment: LLE deficits/detail;RLE deficits/detail RLE Deficits / Details: Wound distally, Kerlix wrapped, grossly weak LLE Deficits / Details: AKA  Cervical / Trunk Assessment Cervical / Trunk Assessment: Normal  Communication   Communication: No difficulties  Cognition Arousal/Alertness: Awake/alert Behavior During Therapy: WFL for tasks assessed/performed Overall Cognitive Status: Within Functional Limits for tasks assessed                                          General Comments      Exercises     Assessment/Plan    PT  Assessment Patient needs continued PT services  PT Problem List Decreased strength;Decreased range of motion;Decreased activity tolerance;Decreased mobility;Decreased balance;Impaired sensation;Pain       PT Treatment Interventions Functional mobility training;Therapeutic activities;Therapeutic exercise;Balance training;Patient/family education;Wheelchair mobility training    PT Goals (Current goals can be found in the Care Plan section)  Acute Rehab PT Goals Patient Stated Goal: does not want amputation PT Goal Formulation: With patient Time For Goal Achievement: 04/07/21 Potential to Achieve Goals: Fair    Frequency Min 2X/week   Barriers to discharge Other (comment) (pt homeless)      Co-evaluation               AM-PAC PT "6 Clicks" Mobility  Outcome Measure Help needed turning from your back to your side while in a flat bed without using bedrails?: A Little Help needed moving from lying on your back to sitting on the side of a flat bed without using bedrails?: A Little Help needed moving to and from a bed to a chair (including a wheelchair)?: A Lot Help needed standing up from a chair using your arms (e.g., wheelchair or bedside chair)?: Total Help needed to walk in hospital room?: Total Help needed climbing 3-5 steps with a railing? : Total 6 Click Score: 11    End of Session Equipment Utilized During Treatment: Gait belt Activity Tolerance: Patient limited by pain Patient left: in bed;with call bell/phone within reach;with bed alarm set Nurse Communication: Mobility status PT Visit Diagnosis: Pain;Other abnormalities of gait and mobility (R26.89) Pain - Right/Left: Right Pain - part of body: Leg    Time: 3664-4034 PT Time Calculation (min) (ACUTE ONLY): 32 min   Charges:   PT Evaluation $PT Eval Moderate Complexity: 1 Mod PT Treatments $Therapeutic Activity: 8-22 mins        Lillia Pauls, PT, DPT Acute Rehabilitation Services Pager  [phone] Office [phone]   Norval Morton 03/24/2021, 12:13 PM

## 2021-03-24 NOTE — Progress Notes (Signed)
Dressing changed on RLE. Pt. Tolerating well

## 2021-03-24 NOTE — [place] FL2 (Signed)
[place] MEDICAID FL2 LEVEL OF CARE SCREENING TOOL     IDENTIFICATION  Patient Name: Jake Nguyen Birthdate: 13-Jan-1966 Sex: male Admission Date (Current Location): 03/19/2021  [place] and [place] Number:  Haynes Bast [id number] Q Facility and Address:  [facility] [place], 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   Gram-negative bacteremia 11/24/2020   Septic shock (HCC) 11/24/2020   Cellulitis of left leg 11/19/2020   Alcohol withdrawal syndrome (HCC) 11/19/2020   Hypokalemia 11/19/2020   Acute metabolic encephalopathy 11/14/2020   Sepsis (HCC) 11/14/2020   S/P BKA (below knee amputation) unilateral, left (HCC)    Neuropathic pain, leg, left    Gangrene (HCC) 07/31/2020   Gangrene of foot (HCC) 07/14/2020   Ataxia    Alcohol abuse with alcohol-induced mood disorder (HCC) 11/10/2019   Alcoholism /alcohol abuse    Alcohol withdrawal syndrome, with delirium (HCC) 11/06/2019   Pressure injury of skin 05/25/2018   Rhabdomyolysis 05/20/2018   Hyponatremia 05/20/2018   Elevated troponin 05/20/2018   Moderate dehydration 05/20/2018   Alcohol use 05/20/2018   Influenza A 05/20/2018   Transaminitis 05/20/2018    Orientation RESPIRATION BLADDER Height & Weight     Self, Time, Situation, Place  Normal Continent Weight: 149 lb 7.6 oz (67.8 kg) Height:  5\' 10"  (177.8 cm)  BEHAVIORAL SYMPTOMS/MOOD NEUROLOGICAL BOWEL NUTRITION STATUS      Continent Diet (see d/c summary)  AMBULATORY STATUS COMMUNICATION OF  NEEDS Skin   Extensive Assist Verbally PU Stage and Appropriate Care   PU Stage 2 Dressing: Daily                   Personal Care Assistance Level of Assistance  Feeding, Dressing, Bathing Bathing Assistance: Limited assistance Feeding assistance: Independent Dressing Assistance: Limited assistance     Functional Limitations Info  Hearing, Speech, Sight Sight Info: Adequate Hearing Info: Adequate Speech Info: Adequate    SPECIAL CARE FACTORS FREQUENCY  PT (By licensed PT), OT (By licensed OT)     PT Frequency: 5x/ week OT Frequency: 5x/ week            Contractures Contractures Info: Not present    Additional Factors Info  Code Status, Allergies Code Status Info: Full Allergies Info: Pork-derived Products           Current Medications (03/24/2021):  This is the current hospital active medication list Current Facility-Administered Medications  Medication Dose Route Frequency Provider Last Rate Last Admin   acetaminophen (TYLENOL) tablet 650 mg  650 mg Oral Q6H PRN 03/26/2021, MD       ceFEPIme (MAXIPIME) 2 g in sodium chloride 0.9 % 100 mL IVPB  2 g Intravenous Q8H Leslye Peer, MD 200 mL/hr at 03/24/21 0828 2 g at 03/24/21 03/26/21   Chlorhexidine Gluconate Cloth 2 % PADS 6 each  6 each Topical Daily 7673, MD   6 each at 03/24/21 0831   dextrose 5 %-0.9 % sodium chloride infusion  Intravenous Continuous Collene Gobble, MD 10 mL/hr at 03/22/21 1656 Rate Change at 03/22/21 1656   docusate sodium (COLACE) capsule 100 mg  100 mg Oral BID PRN Collene Gobble, MD       folic acid (FOLVITE) tablet 1 mg  1 mg Oral Daily Collene Gobble, MD   1 mg at 03/24/21 [id number]   metroNIDAZOLE (FLAGYL) IVPB 500 mg  500 mg Intravenous Q12H Collene Gobble, MD 100 mL/hr at 03/24/21 1359 500 mg at 03/24/21 1359   morphine 2 MG/ML injection 2 mg  2 mg Intravenous Q4H PRN Collene Gobble, MD   2 mg at 03/21/21 0014   multivitamin with minerals tablet 1 tablet  1 tablet  Oral Daily Collene Gobble, MD   1 tablet at 03/24/21 [id number]   oxyCODONE (Oxy IR/ROXICODONE) immediate release tablet 5 mg  5 mg Oral Q4H PRN Collene Gobble, MD   5 mg at 03/23/21 2233   pantoprazole (PROTONIX) injection 40 mg  40 mg Intravenous QHS Collene Gobble, MD   40 mg at 03/23/21 2233   polyethylene glycol (MIRALAX / GLYCOLAX) packet 17 g  17 g Oral Daily PRN Collene Gobble, MD       thiamine tablet 100 mg  100 mg Oral Daily Collene Gobble, MD   100 mg at 03/24/21 0829   vancomycin (VANCOCIN) IVPB 1000 mg/200 mL premix  1,000 mg Intravenous Q8H Collene Gobble, MD 200 mL/hr at 03/24/21 0331 1,000 mg at 03/24/21 0331   zolpidem (AMBIEN) tablet 10 mg  10 mg Oral QHS PRN Collene Gobble, MD   10 mg at 03/23/21 2233     Discharge Medications: Please see discharge summary for a list of discharge medications.  Relevant Imaging Results:  Relevant Lab Results:   Additional Information SSN: [id number],  5'10  149lbs, Moderna COVID-19 Vaccine 08/11/2020  Milinda Antis, LCSWA

## 2021-03-24 NOTE — Progress Notes (Signed)
[facility] PROGRESS NOTE    Progress Note  Jake Nguyen  HUD:[id number] DOB: [birth year] DOA: 03/19/2021 PCP: Patient, No Pcp Per (Inactive)     Brief Narrative:   Jake Nguyen is an 55 y.o. 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  Admitted to pulmonary and critical care as needed pressors which now has been weaned off.   Significant studies: Echocardiogram 03/20/2021 >> EF 60-65% without regional wall motion abnormality.  Diastolic parameters indeterminate.  RV normal size and function  Antibiotics: 03/19/2021 IV vancomycin Flagyl and cefepime  Microbiology data: Blood culture:  Procedures: None  Assessment/Plan:   Septic shock due to right lower extremity wound and cellulitis/hypothermia:   To date has remained negative so far. Continue IV vancomycin, cefepime and Flagyl.   Vascular surgery recommended amputation which the patient has refused. Will have to involve social worker due to his homelessness he is at high risk of readmission.  Acute metabolic encephalopathy likely due to sepsis, hyponatremia and hypothermia: CT of the head was negative. Neurological improved. UDS pending.  Acute on chronic hyponatremia: In the setting of alcoholic use, likely due to hypovolemia.  And alcohol use.  Hypokalemia: Repleted, will recheck this morning.  Chronic alcohol use: He reports drinking last drink was in 03/19/2021.  He does have a past medical history of DTs. Continue Ambien, thiamine and folate. Continue to monitor with  CIWA protocol.  History of right AKA: On 11/21/2020 vascular has been consulted and plans to remove staples.  Chronic thrombocytopenia: In the setting of alcohol use likely due to sepsis.  Homelessness/failure to thrive/protein caloric malnutrition: Child psychotherapist has been consulted for assistance.  History of PAD/history of hyperlipidemia: Likely noncompliance with his medication. Continue aspirin Lipitor.  Acute thrombocytopenia: Holding heparin his  platelets are rising, HIT panel pending.  Severe protein caloric malnutrition: Ensure 3 times daily.  Sacral decubitus ulcer stage II present on admission: RN Pressure Injury Documentation: Pressure Injury 11/25/20 Sacrum Mid;Upper Stage 2 -  Partial thickness loss of dermis presenting as a shallow open injury with a red, pink wound bed without slough. area of broken skin between buttocks just below sacrum. (Active)  11/25/20 0806  Location: Sacrum  Location Orientation: Mid;Upper  Staging: Stage 2 -  Partial thickness loss of dermis presenting as a shallow open injury with a red, pink wound bed without slough.  Wound Description (Comments): area of broken skin between buttocks just below sacrum.  Present on Admission:      Pressure Injury 03/19/21 Buttocks Left Stage 2 -  Partial thickness loss of dermis presenting as a shallow open injury with a red, pink wound bed without slough. pink red (Active)  03/19/21 2300  Location: Buttocks  Location Orientation: Left  Staging: Stage 2 -  Partial thickness loss of dermis presenting as a shallow open injury with a red, pink wound bed without slough.  Wound Description (Comments): pink red  Present on Admission: Yes     Pressure Injury 03/19/21 Buttocks Right Stage 2 -  Partial thickness loss of dermis presenting as a shallow open  injury with a red, pink wound bed without slough. red, pink (Active)  03/19/21 2300  Location: Buttocks  Location Orientation: Right  Staging: Stage 2 -   Partial thickness loss of dermis presenting as a shallow open injury with a red, pink wound bed without slough.  Wound Description (Comments): red, pink  Present on Admission: Yes    Estimated body mass index is 21.45 kg/m as calculated from the following:   Height as of this encounter: 5\' 10"  (1.778 m).   Weight as of this encounter: 67.8 kg.   DVT prophylaxis: lovenox Family Communication:none Status is: Inpatient  Remains inpatient appropriate because: Sepsis with septic shock requiring pressors now probably will need a below the knee amputation     Code Status:     Code Status Orders  (From admission, onward)           Start     Ordered   03/19/21 2043  Full code  Continuous        03/19/21 2046           Code Status History     Date Active Date Inactive Code Status Order ID Comments User Context   11/19/2020 2226 11/27/2020 0224 Full Code [id number]  Chotiner, Claudean SeveranceBradley S, MD ED   11/19/2020 1702 11/19/2020 2226 Full Code [id number]  Olene Flossrosperi, Christian H, PA-C ED   07/31/2020 1312 08/27/2020 1921 Full Code [id number]  Emeline GeneralZhang, Ping T, MD ED   11/06/2019 1531 11/12/2019 2118 Full Code [id number]  Lavonda JumboAutry-Lott, Simone, DO ED   11/06/2019 1206 11/06/2019 1530 Full Code [id number]  Dartha LodgeFord, Kelsey N, PA-C ED   05/20/2018 1709 05/28/2018 2259 Full Code [id number]  Dimple NanasAmin, Ankit Chirag, MD ED   02/02/2016 1702 02/02/2016 2138 Full Code [id number]  Trixie DredgeWest, Emily, PA-C ED         IV Access:   Peripheral IV   Procedures and diagnostic studies:   No results found.   Medical Consultants:   None.   Subjective:    [name] no complaints this morning  Objective:    Vitals:   03/23/21 1720 03/23/21 1956 03/24/21 0446 03/24/21 0930  BP: 120/71 104/60 97/72 101/73  Pulse: 100 (!) 103 96 83  Resp: 18 18 16 18   Temp: 99.1 F (37.3 C) 98.5 F (36.9 C) 99 F (37.2 C) 98.6 F (37 C)  TempSrc:  Oral Oral Oral  SpO2: 96% 95% 100% 100%  Weight:  67.8 kg    Height:        SpO2: 100 % O2 Flow Rate (L/min): 2 L/min   Intake/Output Summary (Last 24 hours) at 03/24/2021 1041 Last data filed at 03/24/2021 0935 Gross per 24 hour  Intake 2644 ml  Output 1200 ml  Net 1444 ml    Filed Weights   03/21/21 0500 03/23/21 0541 03/23/21 1956  Weight: 65.4 kg 67.6 kg 67.8 kg    Exam: General exam: In no acute distress. Respiratory system: Good air movement and clear to auscultation. Cardiovascular system: S1 & S2 heard, RRR. No JVD. Gastrointestinal system: Abdomen is nondistended, soft and nontender.  Psychiatry: Judgement and insight appear normal. Mood & affect appropriate. Extremities: The right leg looks macerated shiny ulcers noted throughout the right lower extremity with erythema.     Data Reviewed:    Labs: Basic Metabolic Panel: Recent Labs  Lab 03/19/21 1704 03/20/21 0007 03/20/21 0955 03/21/21 0152 03/21/21 0154 03/22/21 0758 03/22/21 1016 03/22/21 1411 03/22/21 2010 03/23/21 0613  NA 126* 130*   < >  133*   < > 135 133* 133* 134* 131*  K 3.7 3.0*  --  2.9*  --   --  3.6  --   --  3.8  CL 88* 94*  --  98  --   --  99  --   --  98  CO2 26 26  --  26  --   --  27  --   --  26  GLUCOSE 100* 91  --  119*  --   --  102*  --   --  109*  BUN <5* <5*  --  <5*  --   --  <5*  --   --  <5*  CREATININE <0.30* 0.41*  --  0.59*  --   --  0.61  --   --  0.53*  CALCIUM 7.9* 7.1*  --  7.3*  --   --  7.7*  --   --  7.8*  MG  --  1.5*  --  1.9  --   --  1.6*  --   --   --   PHOS  --  3.2  --   --   --   --   --   --   --   --    < > = values in this interval not displayed.    GFR Estimated Creatinine Clearance: 102.4 mL/min (A) (by C-G formula based on SCr of 0.53 mg/dL (L)). Liver Function Tests: Recent Labs  Lab 03/19/21 1704  AST 81*  ALT 40  ALKPHOS 113  BILITOT 0.6  PROT 6.4*  ALBUMIN 2.1*    No results for input(s): LIPASE, AMYLASE in the last 168 hours. Recent Labs  Lab 03/19/21 1732 03/20/21 0007  AMMONIA 44* 28     Coagulation profile Recent Labs  Lab 03/19/21 1704  INR 0.9    COVID-19 Labs  No results for input(s): DDIMER, FERRITIN, LDH, CRP in the last 72 hours.  Lab Results  Component Value Date   SARSCOV2NAA NEGATIVE 03/19/2021   [place] NEGATIVE 11/25/2020   [place] NEGATIVE 11/19/2020   [place] NEGATIVE 11/14/2020    CBC: Recent Labs  Lab 03/19/21 1704 03/20/21 0007 03/21/21 0712 03/23/21 0613 03/24/21 0745  WBC 3.4* 3.8* 4.7 5.3 5.9  NEUTROABS 2.6  --   --   --   --   HGB 12.8* 10.9* 10.0* 9.7* 10.0*  HCT 37.3* 31.5* 30.0* 29.5* 30.1*  MCV 81.1 80.4 82.4 83.8 83.1  PLT 115* 134* 63* 37* 56*    Cardiac Enzymes: Recent Labs  Lab 03/20/21 0007 03/22/21 1016  CKTOTAL 114 132    BNP (last 3 results) No results for input(s): PROBNP in the last 8760 hours. CBG: Recent Labs  Lab 03/22/21 0741 03/22/21 1146 03/22/21 1616 03/22/21 2042 03/23/21 0004  GLUCAP 106* 103* 137* 114* 174*    D-Dimer: No results for input(s): DDIMER in the last 72 hours. Hgb A1c: No results for input(s): HGBA1C in the last 72 hours. Lipid Profile: No results for input(s): CHOL, HDL, LDLCALC, TRIG, CHOLHDL, LDLDIRECT in the last 72 hours. Thyroid function studies: No results for input(s): TSH, T4TOTAL, T3FREE, THYROIDAB in the last 72 hours.  Invalid input(s): FREET3  Anemia work up: No results for input(s): VITAMINB12, FOLATE, FERRITIN, TIBC, IRON, RETICCTPCT in the last 72 hours. Sepsis Labs: Recent Labs  Lab 03/19/21 1651 03/19/21 1704 03/20/21 0007 03/21/21 0152 03/21/21 0712 03/23/21 0613 03/24/21 0745  PROCALCITON  --   --  <0.10  0.18  --   --   --   WBC  --    < > 3.8*  --  4.7 5.3 5.9  LATICACIDVEN 2.0*  --  2.1*  --   --   --   --    < > = values in this interval not displayed.    Microbiology Recent Results (from the past 240 hour(s))  Resp Panel by RT-PCR (Flu A&B, Covid) Nasopharyngeal Swab     Status: None   Collection Time: 03/19/21   4:54 PM   Specimen: Nasopharyngeal Swab; Nasopharyngeal(NP) swabs in vial transport medium  Result Value Ref Range Status   SARS Coronavirus 2 by RT PCR NEGATIVE NEGATIVE Final    Comment: (NOTE) SARS-CoV-2 target nucleic acids are NOT DETECTED.  The SARS-CoV-2 RNA is generally detectable in upper respiratory specimens during the acute phase of infection. The lowest concentration of SARS-CoV-2 viral copies this assay can detect is 138 copies/mL. A negative result does not preclude SARS-Cov-2 infection and should not be used as the sole basis for treatment or other patient management decisions. A negative result may occur with  improper specimen collection/handling, submission of specimen other than nasopharyngeal swab, presence of viral mutation(s) within the areas targeted by this assay, and inadequate number of viral copies(<138 copies/mL). A negative result must be combined with clinical observations, patient history, and epidemiological information. The expected result is Negative.  Fact Sheet for Patients:  EntrepreneurPulse.com.au  Fact Sheet for Healthcare Providers:  IncredibleEmployment.be  This test is no t yet approved or cleared by the [place] FDA and  has been authorized for detection and/or diagnosis of SARS-CoV-2 by FDA under an Emergency Use Authorization (EUA). This EUA will remain  in effect (meaning this test can be used) for the duration of the COVID-19 declaration under Section 564(b)(1) of the Act, 21 U.S.C.section 360bbb-3(b)(1), unless the authorization is terminated  or revoked sooner.       Influenza A by PCR NEGATIVE NEGATIVE Final   Influenza B by PCR NEGATIVE NEGATIVE Final    Comment: (NOTE) The Xpert Xpress SARS-CoV-2/FLU/RSV plus assay is intended as an aid in the diagnosis of influenza from Nasopharyngeal swab specimens and should not be used as a sole basis for treatment. Nasal washings and aspirates  are unacceptable for Xpert Xpress SARS-CoV-2/FLU/RSV testing.  Fact Sheet for Patients: EntrepreneurPulse.com.au  Fact Sheet for Healthcare Providers: IncredibleEmployment.be  This test is not yet approved or cleared by the [place] FDA and has been authorized for detection and/or diagnosis of SARS-CoV-2 by FDA under an Emergency Use Authorization (EUA). This EUA will remain in effect (meaning this test can be used) for the duration of the COVID-19 declaration under Section 564(b)(1) of the Act, 21 U.S.C. section 360bbb-3(b)(1), unless the authorization is terminated or revoked.  Performed at [facility], [place] [address], [place], [place] [address]   Culture, blood (Routine x 2)     Status: None   Collection Time: 03/19/21  5:00 PM   Specimen: BLOOD LEFT FOREARM  Result Value Ref Range Status   Specimen Description BLOOD LEFT FOREARM  Final   Special Requests   Final    BOTTLES DRAWN AEROBIC AND ANAEROBIC Blood Culture results may not be optimal due to an inadequate volume of blood received in culture bottles   Culture   Final    NO GROWTH 5 DAYS Performed at [facility], [place] [address] [place], [place] [address]    Report Status 03/24/2021  FINAL  Final  Culture, blood (Routine x 2)     Status: None   Collection Time: 03/19/21  5:02 PM   Specimen: BLOOD  Result Value Ref Range Status   Specimen Description BLOOD BLOOD RIGHT HAND  Final   Special Requests   Final    BOTTLES DRAWN AEROBIC AND ANAEROBIC Blood Culture results may not be optimal due to an inadequate volume of blood received in culture bottles   Culture   Final    NO GROWTH 5 DAYS Performed at [facility], [place] [address] [place], [place] [address]    Report Status 03/24/2021 FINAL  Final  Urine Culture     Status: None   Collection Time: 03/19/21  8:43 PM   Specimen: Urine, Clean Catch  Result Value Ref Range Status   Specimen Description URINE,  CLEAN CATCH  Final   Special Requests NONE  Final   Culture   Final    NO GROWTH Performed at [facility], [place] [address] [place], [place] [address]    Report Status 03/21/2021 FINAL  Final  MRSA Next Gen by PCR, Nasal     Status: None   Collection Time: 03/19/21  9:11 PM   Specimen: Nasal Mucosa; Nasal Swab  Result Value Ref Range Status   MRSA by PCR Next Gen NOT DETECTED NOT DETECTED Final    Comment: (NOTE) The GeneXpert MRSA Assay (FDA approved for NASAL specimens only), is one component of a comprehensive MRSA colonization surveillance program. It is not intended to diagnose MRSA infection nor to guide or monitor treatment for MRSA infections. Test performance is not FDA approved in patients less than 22 years old. Performed at [facility], [place] [address], [place],  [address]      Medications:    Chlorhexidine Gluconate Cloth  6 each Topical Daily   folic acid  1 mg Oral Daily   multivitamin with minerals  1 tablet Oral Daily   pantoprazole (PROTONIX) IV  40 mg Intravenous QHS   thiamine  100 mg Oral Daily   Continuous Infusions:  ceFEPime (MAXIPIME) IV 2 g (03/24/21 0828)   dextrose 5 % and 0.9% NaCl 10 mL/hr at 03/22/21 1656   metronidazole 500 mg (03/23/21 2354)   vancomycin 1,000 mg (03/24/21 0331)      LOS: 5 days   Charlynne Cousins  [facility]  03/24/2021, 10:41 AM

## 2021-03-24 NOTE — Progress Notes (Addendum)
°  Progress Note    03/24/2021 4:06 PM Hospital Day 7  Subjective:  sitting up eating dinner.  No complaints  Tm 99  Vitals:   03/24/21 0446 03/24/21 0930  BP: 97/72 101/73  Pulse: 96 83  Resp: 16 18  Temp: 99 F (37.2 C) 98.6 F (37 C)  SpO2: 100% 100%    Physical Exam: general:  no distress   CBC    Component Value Date/Time   WBC 5.9 03/24/2021 0745   RBC 3.62 (L) 03/24/2021 0745   HGB 10.0 (L) 03/24/2021 0745   HCT 30.1 (L) 03/24/2021 0745   HCT 36.4 (L) 05/20/2018 1923   PLT 56 (L) 03/24/2021 0745   MCV 83.1 03/24/2021 0745   MCH 27.6 03/24/2021 0745   MCHC 33.2 03/24/2021 0745   RDW 14.3 03/24/2021 0745   LYMPHSABS 0.6 (L) 03/19/2021 1704   MONOABS 0.2 03/19/2021 1704   EOSABS 0.0 03/19/2021 1704   BASOSABS 0.0 03/19/2021 1704    BMET    Component Value Date/Time   NA 131 (L) 03/23/2021 0613   K 3.8 03/23/2021 0613   CL 98 03/23/2021 0613   CO2 26 03/23/2021 0613   GLUCOSE 109 (H) 03/23/2021 0613   BUN <5 (L) 03/23/2021 0613   CREATININE 0.53 (L) 03/23/2021 0613   CALCIUM 7.8 (L) 03/23/2021 0613   GFRNONAA >60 03/23/2021 0613   GFRAA >60 11/12/2019 0359    INR    Component Value Date/Time   INR 0.9 03/19/2021 1704     Intake/Output Summary (Last 24 hours) at 03/24/2021 1606 Last data filed at 03/24/2021 1257 Gross per 24 hour  Intake 1537 ml  Output 900 ml  Net 637 ml     Assessment/Plan:  55 y.o. male with extensive RLE wounds Hospital Day 4  -pt continues to refuse amputation.   -continue dressing changes daily -we will continue to follow him daily    Doreatha Massed, PA-C [facility] [phone] 03/24/2021 4:06 PM  I agree with the above.  The patient continues to refuse amputation.  He needs to have his right leg cleaned daily with soap and water and a wound dressed with Xeroform over the open areas.  He remains at high risk for amputation.  We will continue to follow him on a daily basis.  Durene Cal

## 2021-03-24 NOTE — Progress Notes (Signed)
Patient agreeable at first to have dressing change. When this RN got to the room with the dressing supplies. Patient then refused stating he is tired, would like to do it in the morning.

## 2021-03-25 LAB — BASIC METABOLIC PANEL
Anion gap: 8 (ref 5–15)
BUN: 5 mg/dL — ABNORMAL LOW (ref 6–20)
CO2: 26 mmol/L (ref 22–32)
Calcium: 8 mg/dL — ABNORMAL LOW (ref 8.9–10.3)
Chloride: 97 mmol/L — ABNORMAL LOW (ref 98–111)
Creatinine, Ser: 0.51 mg/dL — ABNORMAL LOW (ref 0.61–1.24)
GFR, Estimated: >60 mL/min (ref >60)
Glucose, Bld: 156 mg/dL — ABNORMAL HIGH (ref 70–99)
Potassium: 3.7 mmol/L (ref 3.5–5.1)
Sodium: 131 mmol/L — ABNORMAL LOW (ref 135–145)

## 2021-03-25 LAB — GLUCOSE, CAPILLARY
Glucose-Capillary: 161 mg/dL — ABNORMAL HIGH (ref 70–99)
Glucose-Capillary: 151 mg/dL — ABNORMAL HIGH (ref 70–99)

## 2021-03-25 MED ORDER — PANTOPRAZOLE SODIUM 40 MG PO TBEC
40.0000 mg | DELAYED_RELEASE_TABLET | Freq: Every day | ORAL | Status: DC
  Administered 2021-03-25 – 2021-03-26 (×2): 40 mg via ORAL
  Filled 2021-03-25 (×2): qty 1

## 2021-03-25 NOTE — Progress Notes (Signed)
Dressing changed on pt. RLE. Pt tolerated procedure with no difficulty noted.

## 2021-03-25 NOTE — Plan of Care (Signed)

## 2021-03-25 NOTE — Progress Notes (Addendum)
[facility] [place]  Subjective  - Eating breakfast appears comfortable   Objective 108/85 92 (!) 97.5 F (36.4 C) (Oral) 20 97%  Intake/Output Summary (Last 24 hours) at 03/25/2021 0715 Last data filed at 03/25/2021 0354 Gross per 24 hour  Intake 600 ml  Output 2140 ml  Net -1540 ml    Lungs non labored breathing No change in right LE  Assessment/Planning: Patient is refusing amputation  -continue dressing changes daily -we will continue to follow him daily   Mosetta Pigeon 03/25/2021 7:15 AM --  Laboratory Lab Results: Recent Labs    03/23/21 0613 03/24/21 0745  WBC 5.3 5.9  HGB 9.7* 10.0*  HCT 29.5* 30.1*  PLT 37* 56*   BMET Recent Labs    03/23/21 0613 03/25/21 0424  NA 131* 131*  K 3.8 3.7  CL 98 97*  CO2 26 26  GLUCOSE 109* 156*  BUN <5* 5*  CREATININE 0.53* 0.51*  CALCIUM 7.8* 8.0*    COAG Lab Results  Component Value Date   INR 0.9 03/19/2021   INR 1.0 11/19/2020   INR 1.1 08/05/2020   No results found for: PTT

## 2021-03-25 NOTE — Progress Notes (Signed)
[facility] PROGRESS NOTE    Progress Note  Jake Nguyen  BOF:[id number] DOB: 19-Jun-1965 DOA: 03/19/2021 PCP: Patient, No Pcp Per (Inactive)     Brief Narrative:   Jake Nguyen is an 55 y.o. 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  Admitted to pulmonary and critical care as needed pressors which now has been weaned off.   Significant studies: Echocardiogram 03/20/2021 >> EF 60-65% without regional wall motion abnormality.  Diastolic parameters indeterminate.  RV normal size and function  Antibiotics: 03/19/2021 IV vancomycin Flagyl and cefepime  Microbiology data: Blood culture:  Procedures: None  Assessment/Plan:   Septic shock due to right lower extremity wound and cellulitis/hypothermia:   Blood cultures have been negative till date. Continue IV vancomycin, cefepime and Flagyl. Vascular surgery was consulted the patient refused amputation. Vascular recommended to continue to clean wound daily with soap and water and dressed with Xeroform over open areas . Will have to involve social worker due to his homelessness he is at high risk of readmission.  Acute metabolic encephalopathy likely due to sepsis, hyponatremia and hypothermia: CT of the head was negative. Neurological improved. UDS p negative  Acute on chronic hyponatremia: In the setting of alcoholic use, likely due to hypovolemia and alcohol use.  Hypokalemia: Repleted, will recheck this morning.  Chronic alcohol use: He reports drinking last drink was in  03/19/2021.  He does have a past medical history of DTs. Continue Ambien, thiamine and folate. Continue to monitor with CIWA protocol.  History of Left AKA: On 11/21/2020 vascular has been consulted and plans to remove staples.  Chronic thrombocytopenia: In the setting of alcohol use likely due to sepsis.  Homelessness/failure to thrive/protein caloric malnutrition: Child psychotherapist has been consulted for assistance.  History of PAD/history of hyperlipidemia: Likely noncompliance with his medication. Continue aspirin Lipitor.  Acute thrombocytopenia: Holding heparin his  platelets are rising, HIT panel pending.  Severe protein caloric malnutrition: Ensure 3 times daily.  Sacral decubitus ulcer stage II present on admission: RN Pressure Injury Documentation: Pressure Injury 11/25/20 Sacrum Mid;Upper Stage 2 -  Partial thickness loss of dermis presenting as a shallow open injury with a red, pink wound bed without slough. area of broken skin between buttocks just below sacrum. (Active)  11/25/20 0806  Location: Sacrum  Location Orientation: Mid;Upper  Staging: Stage 2 -  Partial thickness loss of dermis presenting as a shallow open injury with a red, pink wound bed without slough.  Wound Description (Comments): area of broken skin between buttocks just below sacrum.  Present on Admission:      Pressure Injury 03/19/21 Buttocks Left Stage 2 -  Partial thickness loss of dermis presenting as a shallow open injury with a red, pink wound bed without slough. pink red (Active)  03/19/21 2300  Location: Buttocks  Location Orientation: Left  Staging: Stage 2 -  Partial thickness loss of dermis presenting as a shallow open injury with a red, pink wound bed without slough.  Wound Description (Comments): pink red  Present on Admission: Yes     Pressure Injury  03/19/21 Buttocks Right Stage 2 -  Partial thickness loss of dermis presenting as a shallow open injury with a red, pink wound bed without  slough. red, pink (Active)  03/19/21 2300  Location: Buttocks  Location Orientation: Right  Staging: Stage 2 -  Partial thickness loss of dermis presenting as a shallow open injury with a red, pink wound bed without slough.  Wound Description (Comments): red, pink  Present on Admission: Yes    Estimated body mass index is 21.45 kg/m as calculated from the following:   Height as of this encounter: 5\' 10"  (1.778 m).   Weight as of this encounter: 67.8 kg.   DVT prophylaxis: lovenox Family Communication:none Status is: Inpatient  Remains inpatient appropriate because: Sepsis with septic shock requiring pressors now probably will need a below the knee amputation     Code Status:     Code Status Orders  (From admission, onward)           Start     Ordered   03/19/21 2043  Full code  Continuous        03/19/21 2046           Code Status History     Date Active Date Inactive Code Status Order ID Comments User Context   11/19/2020 2226 11/27/2020 0224 Full Code [id number]  Chotiner, Yevonne Aline, MD ED   11/19/2020 1702 11/19/2020 2226 Full Code [id number]  Anselmo Pickler, PA-C ED   07/31/2020 1312 08/27/2020 1921 Full Code [id number]  Lequita Halt, MD ED   11/06/2019 1531 11/12/2019 2118 Full Code [id number]  Gerlene Fee, DO ED   11/06/2019 1206 11/06/2019 1530 Full Code [id number]  Jacqlyn Larsen, PA-C ED   05/20/2018 1709 05/28/2018 2259 Full Code [id number]  Damita Lack, MD ED   02/02/2016 1702 02/02/2016 2138 Full Code [id number]  Clayton Bibles, PA-C ED         IV Access:   Peripheral IV   Procedures and diagnostic studies:   No results found.   Medical Consultants:   None.   Subjective:    Jake Nguyen no complaints.  Objective:    Vitals:   03/24/21 0930 03/24/21 1739 03/24/21 2112 03/25/21 0440  BP: 101/73 139/77 105/67 108/85  Pulse: 83 100 100 92  Resp: 18 17 18 20   Temp: 98.6 F (37 C) (!) 97.4 F (36.3 C) 100.1 F (37.8 C)  (!) 97.5 F (36.4 C)  TempSrc: Oral  Oral Oral  SpO2: 100% 100% 96% 97%  Weight:      Height:       SpO2: 97 % O2 Flow Rate (L/min): 2 L/min   Intake/Output Summary (Last 24 hours) at 03/25/2021 0838 Last data filed at 03/25/2021 0354 Gross per 24 hour  Intake 600 ml  Output 2140 ml  Net -1540 ml    Filed Weights   03/21/21 0500 03/23/21 0541 03/23/21 1956  Weight: 65.4 kg 67.6 kg 67.8 kg    Exam: General exam: In no acute distress. Respiratory system: Good air movement and clear to auscultation. Cardiovascular system: S1 & S2 heard, RRR. No JVD. Gastrointestinal system: Abdomen is nondistended, soft and nontender.  Psychiatry: Judgement and insight appear normal. Mood & affect appropriate. Extremities: The right leg looks macerated shiny ulcers noted throughout the right lower extremity with erythema.     Data Reviewed:    Labs: Basic Metabolic Panel: Recent Labs  Lab 03/20/21 0007 03/20/21 0955 03/21/21 0152 03/21/21 0154 03/22/21 1016 03/22/21  1411 03/22/21 2010 03/23/21 0613 03/25/21 0424  NA 130*   < > 133*   < > 133* 133* 134* 131* 131*  K 3.0*  --  2.9*  --  3.6  --   --  3.8 3.7  CL 94*  --  98  --  99  --   --  98 97*  CO2 26  --  26  --  27  --   --  26 26  GLUCOSE 91  --  119*  --  102*  --   --  109* 156*  BUN <5*  --  <5*  --  <5*  --   --  <5* 5*  CREATININE 0.41*  --  0.59*  --  0.61  --   --  0.53* 0.51*  CALCIUM 7.1*  --  7.3*  --  7.7*  --   --  7.8* 8.0*  MG 1.5*  --  1.9  --  1.6*  --   --   --   --   PHOS 3.2  --   --   --   --   --   --   --   --    < > = values in this interval not displayed.    GFR Estimated Creatinine Clearance: 102.4 mL/min (A) (by C-G formula based on SCr of 0.51 mg/dL (L)). Liver Function Tests: Recent Labs  Lab 03/19/21 1704  AST 81*  ALT 40  ALKPHOS 113  BILITOT 0.6  PROT 6.4*  ALBUMIN 2.1*    No results for input(s): LIPASE, AMYLASE in the last 168 hours. Recent Labs  Lab 03/19/21 1732  03/20/21 0007  AMMONIA 44* 28    Coagulation profile Recent Labs  Lab 03/19/21 1704  INR 0.9    COVID-19 Labs  No results for input(s): DDIMER, FERRITIN, LDH, CRP in the last 72 hours.  Lab Results  Component Value Date   SARSCOV2NAA NEGATIVE 03/19/2021   [place] NEGATIVE 11/25/2020   [place] NEGATIVE 11/19/2020   [place] NEGATIVE 11/14/2020    CBC: Recent Labs  Lab 03/19/21 1704 03/20/21 0007 03/21/21 0712 03/23/21 0613 03/24/21 0745  WBC 3.4* 3.8* 4.7 5.3 5.9  NEUTROABS 2.6  --   --   --   --   HGB 12.8* 10.9* 10.0* 9.7* 10.0*  HCT 37.3* 31.5* 30.0* 29.5* 30.1*  MCV 81.1 80.4 82.4 83.8 83.1  PLT 115* 134* 63* 37* 56*    Cardiac Enzymes: Recent Labs  Lab 03/20/21 0007 03/22/21 1016  CKTOTAL 114 132    BNP (last 3 results) No results for input(s): PROBNP in the last 8760 hours. CBG: Recent Labs  Lab 03/22/21 0741 03/22/21 1146 03/22/21 1616 03/22/21 2042 03/23/21 0004  GLUCAP 106* 103* 137* 114* 174*    D-Dimer: No results for input(s): DDIMER in the last 72 hours. Hgb A1c: No results for input(s): HGBA1C in the last 72 hours. Lipid Profile: No results for input(s): CHOL, HDL, LDLCALC, TRIG, CHOLHDL, LDLDIRECT in the last 72 hours. Thyroid function studies: No results for input(s): TSH, T4TOTAL, T3FREE, THYROIDAB in the last 72 hours.  Invalid input(s): FREET3  Anemia work up: No results for input(s): VITAMINB12, FOLATE, FERRITIN, TIBC, IRON, RETICCTPCT in the last 72 hours. Sepsis Labs: Recent Labs  Lab 03/19/21 1651 03/19/21 1704 03/20/21 0007 03/21/21 0152 03/21/21 0712 03/23/21 0613 03/24/21 0745  PROCALCITON  --   --  <0.10 0.18  --   --   --   WBC  --    < >  3.8*  --  4.7 5.3 5.9  LATICACIDVEN 2.0*  --  2.1*  --   --   --   --    < > = values in this interval not displayed.    Microbiology Recent Results (from the past 240 hour(s))  Resp Panel by RT-PCR (Flu A&B, Covid) Nasopharyngeal Swab     Status: None    Collection Time: 03/19/21  4:54 PM   Specimen: Nasopharyngeal Swab; Nasopharyngeal(NP) swabs in vial transport medium  Result Value Ref Range Status   SARS Coronavirus 2 by RT PCR NEGATIVE NEGATIVE Final    Comment: (NOTE) SARS-CoV-2 target nucleic acids are NOT DETECTED.  The SARS-CoV-2 RNA is generally detectable in upper respiratory specimens during the acute phase of infection. The lowest concentration of SARS-CoV-2 viral copies this assay can detect is 138 copies/mL. A negative result does not preclude SARS-Cov-2 infection and should not be used as the sole basis for treatment or other patient management decisions. A negative result may occur with  improper specimen collection/handling, submission of specimen other than nasopharyngeal swab, presence of viral mutation(s) within the areas targeted by this assay, and inadequate number of viral copies(<138 copies/mL). A negative result must be combined with clinical observations, patient history, and epidemiological information. The expected result is Negative.  Fact Sheet for Patients:  EntrepreneurPulse.com.au  Fact Sheet for Healthcare Providers:  IncredibleEmployment.be  This test is no t yet approved or cleared by the [place] FDA and  has been authorized for detection and/or diagnosis of SARS-CoV-2 by FDA under an Emergency Use Authorization (EUA). This EUA will remain  in effect (meaning this test can be used) for the duration of the COVID-19 declaration under Section 564(b)(1) of the Act, 21 U.S.C.section 360bbb-3(b)(1), unless the authorization is terminated  or revoked sooner.       Influenza A by PCR NEGATIVE NEGATIVE Final   Influenza B by PCR NEGATIVE NEGATIVE Final    Comment: (NOTE) The Xpert Xpress SARS-CoV-2/FLU/RSV plus assay is intended as an aid in the diagnosis of influenza from Nasopharyngeal swab specimens and should not be used as a sole basis for treatment.  Nasal washings and aspirates are unacceptable for Xpert Xpress SARS-CoV-2/FLU/RSV testing.  Fact Sheet for Patients: EntrepreneurPulse.com.au  Fact Sheet for Healthcare Providers: IncredibleEmployment.be  This test is not yet approved or cleared by the [place] FDA and has been authorized for detection and/or diagnosis of SARS-CoV-2 by FDA under an Emergency Use Authorization (EUA). This EUA will remain in effect (meaning this test can be used) for the duration of the COVID-19 declaration under Section 564(b)(1) of the Act, 21 U.S.C. section 360bbb-3(b)(1), unless the authorization is terminated or revoked.  Performed at [facility], [place] [address], [place], [place] [address]   Culture, blood (Routine x 2)     Status: None   Collection Time: 03/19/21  5:00 PM   Specimen: BLOOD LEFT FOREARM  Result Value Ref Range Status   Specimen Description BLOOD LEFT FOREARM  Final   Special Requests   Final    BOTTLES DRAWN AEROBIC AND ANAEROBIC Blood Culture results may not be optimal due to an inadequate volume of blood received in culture bottles   Culture   Final    NO GROWTH 5 DAYS Performed at [facility], [place] [address] [place], [place] [address]    Report Status 03/24/2021 FINAL  Final  Culture, blood (Routine x 2)     Status: None   Collection Time:  03/19/21  5:02 PM   Specimen: BLOOD  Result Value Ref Range Status   Specimen Description BLOOD BLOOD RIGHT HAND  Final   Special Requests   Final    BOTTLES DRAWN AEROBIC AND ANAEROBIC Blood Culture results may not be optimal due to an inadequate volume of blood received in culture bottles   Culture   Final    NO GROWTH 5 DAYS Performed at [facility], [place] [address] [place], [place] [address]    Report Status 03/24/2021 FINAL  Final  Urine Culture     Status: None   Collection Time: 03/19/21  8:43 PM   Specimen: Urine, Clean Catch  Result Value Ref Range Status    Specimen Description URINE, CLEAN CATCH  Final   Special Requests NONE  Final   Culture   Final    NO GROWTH Performed at [facility], [place] [address], [place], [place] [address]    Report Status 03/21/2021 FINAL  Final  MRSA Next Gen by PCR, Nasal     Status: None   Collection Time: 03/19/21  9:11 PM   Specimen: Nasal Mucosa; Nasal Swab  Result Value Ref Range Status   MRSA by PCR Next Gen NOT DETECTED NOT DETECTED Final    Comment: (NOTE) The GeneXpert MRSA Assay (FDA approved for NASAL specimens only), is one component of a comprehensive MRSA colonization surveillance program. It is not intended to diagnose MRSA infection nor to guide or monitor treatment for MRSA infections. Test performance is not FDA approved in patients less than 76 years old. Performed at [facility], [place] [address] [place], [place] [address]      Medications:    Chlorhexidine Gluconate Cloth  6 each Topical Daily   folic acid  1 mg Oral Daily   multivitamin with minerals  1 tablet Oral Daily   pantoprazole (PROTONIX) IV  40 mg Intravenous QHS   thiamine  100 mg Oral Daily   Continuous Infusions:  ceFEPime (MAXIPIME) IV 2 g (03/25/21 0136)   dextrose 5 % and 0.9% NaCl 10 mL/hr at 03/22/21 1656   metronidazole 500 mg (03/24/21 2354)   vancomycin 1,000 mg (03/25/21 0354)      LOS: 6 days   Charlynne Cousins  [facility]  03/25/2021, 8:38 AM

## 2021-03-25 NOTE — TOC Progression Note (Signed)
Transition of Care [facility]) - Initial/Assessment Note    Patient Details  Name: NOLBERTO CHEUVRONT MRN: [id number] Date of Birth: Jul 09, 1965  [facility] CM/SW Contact:    Ralene Bathe, LCSWA Phone Number: 03/25/2021, 10:14 AM  Clinical Narrative:                 CSW reviewed chart.  The patient does not have any bed offers at this time.       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   Gram-negative bacteremia 11/24/2020   Septic shock (HCC) 11/24/2020   Cellulitis of left leg 11/19/2020   Alcohol withdrawal syndrome (HCC) 11/19/2020   Hypokalemia 11/19/2020   Acute metabolic encephalopathy 11/14/2020   Sepsis (HCC) 11/14/2020   S/P BKA (below knee amputation) unilateral, left (HCC)    Neuropathic pain, leg, left    Gangrene (HCC) 07/31/2020   Gangrene of foot (HCC) 07/14/2020   Ataxia    Alcohol abuse with alcohol-induced mood disorder (HCC) 11/10/2019   Alcoholism /alcohol abuse    Alcohol withdrawal syndrome, with delirium (HCC) 11/06/2019   Pressure injury of skin 05/25/2018   Rhabdomyolysis 05/20/2018   Hyponatremia 05/20/2018   Elevated troponin 05/20/2018   Moderate dehydration 05/20/2018   Alcohol use 05/20/2018   Influenza A 05/20/2018   Transaminitis 05/20/2018   PCP:  Patient, No Pcp Per (Inactive) Pharmacy:   [facility] [phone] - [place], [place] - [phone]  [address] [facility] [address] [place] [address] [address] Phone: [phone] Fax: [phone]     Social Determinants of Health (SDOH) Interventions    Readmission Risk Interventions Readmission Risk Prevention Plan 11/25/2020 08/10/2020  Transportation Screening Complete Complete  PCP or Specialist Appt within 5-7 Days - Complete  PCP or Specialist Appt within 3-5 Days Complete -  Home Care Screening - Complete  Medication Review (RN CM) - Complete  HRI or Home Care Consult Complete -  Social Work Consult for Recovery Care Planning/Counseling Complete -  Palliative Care Screening Not Applicable -  Medication Review (RN Care Manager) Complete -  Some recent data might be hidden

## 2021-03-25 NOTE — Progress Notes (Signed)
Pharmacy Antibiotic Note  Jake Nguyen is a 55 y.o. male admitted on 03/19/2021 increased altered mental status, R leg necrotic, with concern for sepsis. Pharmacy has been consulted for vancomycin and cefepime dosing x 7 days.    Vanc regimen adjusted on 12/11 based on levels.   Renal function stable.     Amputation recommended but patient declined.   Plan: Continues Vancomycin 1gm IV q8hrs and Cefepime 2 gm IV q8hrs. 7-day course of Vanc and Cefepime is scheduled to stop after all doses on 03/26/21.   Also on Metronidazole 500 mg IV q12h. Will follow up for stop time.   Height: 5\' 10"  (177.8 cm) Weight: 67.8 kg (149 lb 7.6 oz) IBW/kg (Calculated) : 73  Temp (24hrs), Avg:98.4 F (36.9 C), Min:97.4 F (36.3 C), Max:100.1 F (37.8 C)  Recent Labs  Lab 03/19/21 1651 03/19/21 1704 03/19/21 1704 03/20/21 0007 03/21/21 0152 03/21/21 0712 03/21/21 1215 03/21/21 1607 03/22/21 1016 03/23/21 0613 03/24/21 0745 03/25/21 0424  WBC  --  3.4*  --  3.8*  --  4.7  --   --   --  5.3 5.9  --   CREATININE  --  <0.30*   < > 0.41* 0.59*  --   --   --  0.61 0.53*  --  0.51*  LATICACIDVEN 2.0*  --   --  2.1*  --   --   --   --   --   --   --   --   VANCOTROUGH  --   --   --   --   --   --  12*  --   --   --   --   --   VANCOPEAK  --   --   --   --   --   --   --  22*  --   --   --   --    < > = values in this interval not displayed.    Estimated Creatinine Clearance: 102.4 mL/min (A) (by C-G formula based on SCr of 0.51 mg/dL (L)).    Allergies  Allergen Reactions   Pork-Derived Products Other (See Comments)    Pt states he doesn't eat pork    Antimicrobials this admission: Cefepime 12/9>>(12/16) Vancomycin 12/9>>(12/16) Metronidazole IV 12/9 >  Dose adjustments this admission: 12/11: VT 12 at 1215 12/11: VP 22 at 1607 - on Vanc 750 IV q8h > incr 1gm q8h  - calculated AUC on 750 mg: 384 > est AUC on 1gm: 512  Microbiology results: 12/9 blood: negative 12/9 urine:  negative 12/9 MRSA PCR: negative 12/9 COVID and flu: negative  Thank you for allowing pharmacy to be a part of this patients care.  14/9, Dennie Fetters 03/25/2021 4:23 PM

## 2021-03-26 LAB — GLUCOSE, CAPILLARY
Glucose-Capillary: 132 mg/dL — ABNORMAL HIGH (ref 70–99)
Glucose-Capillary: 168 mg/dL — ABNORMAL HIGH (ref 70–99)
Glucose-Capillary: 165 mg/dL — ABNORMAL HIGH (ref 70–99)
Glucose-Capillary: 143 mg/dL — ABNORMAL HIGH (ref 70–99)

## 2021-03-26 MED ORDER — AMOXICILLIN-POT CLAVULANATE 875-125 MG PO TABS
1.0000 | ORAL_TABLET | Freq: Two times a day (BID) | ORAL | Status: DC
  Administered 2021-03-26 – 2021-03-27 (×3): 1 via ORAL
  Filled 2021-03-26 (×3): qty 1

## 2021-03-26 MED ORDER — DOXYCYCLINE HYCLATE 100 MG PO TABS
100.0000 mg | ORAL_TABLET | Freq: Two times a day (BID) | ORAL | Status: DC
  Administered 2021-03-26 – 2021-03-27 (×3): 100 mg via ORAL
  Filled 2021-03-26 (×3): qty 1

## 2021-03-26 MED ORDER — MELATONIN 3 MG PO TABS
3.0000 mg | ORAL_TABLET | Freq: Every day | ORAL | Status: DC
  Administered 2021-03-26: 3 mg via ORAL
  Filled 2021-03-26: qty 1

## 2021-03-26 NOTE — Plan of Care (Signed)

## 2021-03-26 NOTE — Progress Notes (Addendum)
°  Progress Note    03/26/2021 7:39 AM Hospital Day 6  Subjective:  resting comfortably; says the dressing on his right leg was just changed.  Denies any pain or new changes.  afebrile  Vitals:   03/25/21 2100 03/26/21 0425  BP: 94/60 97/63  Pulse: 84 79  Resp: 18 17  Temp: 98.6 F (37 C) 98.4 F (36.9 C)  SpO2: 96% 95%    Physical Exam: General:  no distress; wakes easily Lungs:  non labored   CBC    Component Value Date/Time   WBC 5.9 03/24/2021 0745   RBC 3.62 (L) 03/24/2021 0745   HGB 10.0 (L) 03/24/2021 0745   HCT 30.1 (L) 03/24/2021 0745   HCT 36.4 (L) 05/20/2018 1923   PLT 56 (L) 03/24/2021 0745   MCV 83.1 03/24/2021 0745   MCH 27.6 03/24/2021 0745   MCHC 33.2 03/24/2021 0745   RDW 14.3 03/24/2021 0745   LYMPHSABS 0.6 (L) 03/19/2021 1704   MONOABS 0.2 03/19/2021 1704   EOSABS 0.0 03/19/2021 1704   BASOSABS 0.0 03/19/2021 1704    BMET    Component Value Date/Time   NA 131 (L) 03/25/2021 0424   K 3.7 03/25/2021 0424   CL 97 (L) 03/25/2021 0424   CO2 26 03/25/2021 0424   GLUCOSE 156 (H) 03/25/2021 0424   BUN 5 (L) 03/25/2021 0424   CREATININE 0.51 (L) 03/25/2021 0424   CALCIUM 8.0 (L) 03/25/2021 0424   GFRNONAA >60 03/25/2021 0424   GFRAA >60 11/12/2019 0359    INR    Component Value Date/Time   INR 0.9 03/19/2021 1704     Intake/Output Summary (Last 24 hours) at 03/26/2021 0739 Last data filed at 03/26/2021 0658 Gross per 24 hour  Intake 2080 ml  Output 2725 ml  Net -645 ml     Assessment/Plan:  55 y.o. male with extensive RLE wounds  Hospital Day 6  -pt's dressing just changed so I did not remove this.  He denies pain in the right leg.  Continue current dressing changes cleaning with soap and water and placing Xeroform over open areas.     Doreatha Massed, PA-C [facility] [phone] 03/26/2021 7:39 AM   Patient continues to refuse amputation.  He wants to go home.  We discussed wound care and oral  antibiotics with follow-up.  Durene Cal

## 2021-03-26 NOTE — Progress Notes (Signed)
Physical Therapy Treatment Patient Details Name: Jake Nguyen MRN: [id number] DOB: [birth year] Today's Date: 03/26/2021   History of Present Illness Pt is a 55 y.o. M who presents with AMS 03/19/2021. Found to be hypothermic, septic shock due to RLE wound and cellulitis. Vascular surgery recommended amputation, which the patient has refused. Significant PMH: alcohol abuse, L AKA.    PT Comments    Pt tolerates treatment well, performing lateral scoot transfers with less assistance than transfers in previous session. Pt will likely need to be able to perform some form of stand or squat pivot transfer if discharging to the community as he will need to mobilize between surfaces of varying height. Pt will also need to be able to perform a floor transfer, to demonstrate the ability to return to wheelchair from ground if he experiences a fall or must sleep on the floor. PT continues to recommend SNF placement as the pt is currently unable to independently perform either of these tasks.   Recommendations for follow up therapy are one component of a multi-disciplinary discharge planning process, led by the attending physician.  Recommendations may be updated based on patient status, additional functional criteria and insurance authorization.  Follow Up Recommendations  Skilled nursing-short term rehab (<3 hours/day)     Assistance Recommended at Discharge Frequent or constant Supervision/Assistance  Equipment Recommendations  Wheelchair (measurements PT);Wheelchair cushion (measurements PT)    Recommendations for Other Services       Precautions / Restrictions Precautions Precautions: Fall;Other (comment) Precaution Comments: L AKA Restrictions Weight Bearing Restrictions: No     Mobility  Bed Mobility Overal bed mobility: Needs Assistance Bed Mobility: Supine to Sit;Sit to Supine     Supine to sit: Supervision Sit to supine: Supervision        Transfers Overall transfer level:  Needs assistance Equipment used: None Transfers: Bed to chair/wheelchair/BSC            Lateral/Scoot Transfers: Min assist;Min guard General transfer comment: minA bed to wheelchair, minG fro wheelchair to bed, pt putting more weight through R foot for transfer back to bed. Pt requiring assistance for wheelchair setup    Ambulation/Gait                   Psychologist, counselling mobility: Yes Wheelchair propulsion: Both upper extremities Wheelchair parts: Supervision/cueing Distance: 120 (120', additional 80') Wheelchair Assistance Details (indicate cue type and reason): pt in bariatric wheelchair, much wider than necessary for patient but only wheelchair available with armrests that was not broken  Modified Rankin (Stroke Patients Only)       Balance Overall balance assessment: Needs assistance Sitting-balance support: Feet supported;No upper extremity supported Sitting balance-Leahy Scale: Good                                      Cognition Arousal/Alertness: Awake/alert Behavior During Therapy: WFL for tasks assessed/performed Overall Cognitive Status: Within Functional Limits for tasks assessed                                          Exercises      General Comments General comments (skin integrity, edema, etc.): VSS on RA      Pertinent Vitals/Pain Pain  Assessment: Faces Faces Pain Scale: Hurts whole lot Pain Location: R foot Pain Descriptors / Indicators: Sharp Pain Intervention(s): Monitored during session    Home Living                          Prior Function            PT Goals (current goals can now be found in the care plan section) Acute Rehab PT Goals Patient Stated Goal: does not want amputation Progress towards PT goals: Progressing toward goals    Frequency    Min 2X/week      PT Plan Current plan remains appropriate     Co-evaluation              AM-PAC PT "6 Clicks" Mobility   Outcome Measure  Help needed turning from your back to your side while in a flat bed without using bedrails?: A Little Help needed moving from lying on your back to sitting on the side of a flat bed without using bedrails?: A Little Help needed moving to and from a bed to a chair (including a wheelchair)?: A Little Help needed standing up from a chair using your arms (e.g., wheelchair or bedside chair)?: Total Help needed to walk in hospital room?: Total Help needed climbing 3-5 steps with a railing? : Total 6 Click Score: 12    End of Session   Activity Tolerance: Patient limited by pain Patient left: in bed;with call bell/phone within reach;with bed alarm set Nurse Communication: Mobility status PT Visit Diagnosis: Pain;Other abnormalities of gait and mobility (R26.89) Pain - Right/Left: Right Pain - part of body: Leg     Time: 0017-4944 PT Time Calculation (min) (ACUTE ONLY): 51 min  Charges:  $Therapeutic Activity: 23-37 mins $Wheel Chair Management: 8-22 mins                     Arlyss Gandy, PT, DPT Acute Rehabilitation Pager: [phone] Office [phone]    Arlyss Gandy 03/26/2021, 4:39 PM

## 2021-03-26 NOTE — Progress Notes (Signed)
[facility] PROGRESS NOTE    Progress Note  Jake Nguyen  [id number] DOB: [birth year] DOA: 03/19/2021 PCP: Patient, No Pcp Per (Inactive)     Brief Narrative:   Jake Nguyen is an 55 y.o. 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  Admitted to pulmonary and critical care as needed pressors which now has been weaned off.   Significant studies: Echocardiogram 03/20/2021 >> EF 60-65% without regional wall motion abnormality.  Diastolic parameters indeterminate.  RV normal size and function  Antibiotics: 03/19/2021 IV vancomycin and cefepime  Microbiology data: Blood culture:  Procedures: None  Assessment/Plan:   Septic shock due to right lower extremity wound and cellulitis/hypothermia:   Blood cultures have been negative till date. Continue IV vancomycin and cefepime discontinue Flagyl. Vascular surgery was consulted on the patient has refused amputation Continue to clean wound daily with soap and water and dressed with Xeroform over open areas . Worker to help with placement.  Acute metabolic encephalopathy likely due to sepsis, hyponatremia and hypothermia: CT of the head was negative. Neurological improved. UDS negative  Acute on chronic hyponatremia: In the setting of alcoholic use, likely due to hypovolemia and alcohol use. Now improved.  Hypokalemia: Repleted, will recheck this morning.  Chronic alcohol use: He reports drinking last drink was in 03/19/2021.  He does have a past medical history of DTs. Continue  Ambien, thiamine and folate. Continue to monitor with CIWA protocol.  History of Left AKA: On 11/21/2020 vascular has been consulted and plans to remove staples.  Chronic thrombocytopenia: In the setting of alcohol use likely due to sepsis.  Homelessness/failure to thrive/protein caloric malnutrition: Education officer, museum has been consulted for assistance.  History of PAD/history of hyperlipidemia: Likely noncompliance with his medication. Continue aspirin Lipitor.  Acute thrombocytopenia: Heparin-induced antibody is negative.  Severe protein caloric malnutrition: Ensure 3 times daily.  Sacral decubitus ulcer stage II present on admission: RN Pressure Injury Documentation: Pressure Injury 11/25/20 Sacrum Mid;Upper Stage 2 -  Partial thickness loss of dermis presenting as a shallow open injury with a red, pink wound bed without slough. area of broken skin between buttocks just below sacrum. (Active)  11/25/20 0806  Location: Sacrum  Location Orientation: Mid;Upper  Staging: Stage 2 -  Partial thickness loss of dermis presenting as a shallow open injury with a red, pink wound bed without slough.  Wound Description (Comments): area of broken skin between buttocks just below sacrum.  Present on Admission:      Pressure Injury 03/19/21 Buttocks Left Stage 2 -  Partial thickness loss of dermis presenting as a shallow open injury with a red, pink wound bed without slough. pink red (Active)  03/19/21 2300  Location: Buttocks  Location Orientation: Left  Staging: Stage 2 -  Partial thickness loss of dermis presenting as a shallow open injury with a red, pink wound bed without slough.  Wound Description (Comments): pink red  Present on Admission: Yes     Pressure Injury 03/19/21 Buttocks Right Stage 2 -  Partial thickness loss of dermis presenting as a shallow open injury  with a red, pink wound bed without slough. red, pink (Active)  03/19/21 2300  Location: Buttocks  Location Orientation:  Right  Staging: Stage 2 -  Partial thickness loss of dermis presenting as a shallow open injury with a red, pink wound bed without slough.  Wound Description (Comments): red, pink  Present on Admission: Yes    Estimated body mass index is 19.96 kg/m as calculated from the following:   Height as of this encounter: 5\' 10"  (1.778 m).   Weight as of this encounter: 63.1 kg.   DVT prophylaxis: lovenox Family Communication:none Status is: Inpatient  Remains inpatient appropriate because: Sepsis with septic shock requiring pressors now probably will need a below the knee amputation     Code Status:     Code Status Orders  (From admission, onward)           Start     Ordered   03/19/21 2043  Full code  Continuous        03/19/21 2046           Code Status History     Date Active Date Inactive Code Status Order ID Comments User Context   11/19/2020 2226 11/27/2020 0224 Full Code [id number]  Chotiner, Yevonne Aline, MD ED   11/19/2020 1702 11/19/2020 2226 Full Code [id number]  Anselmo Pickler, PA-C ED   07/31/2020 1312 08/27/2020 1921 Full Code [id number]  Lequita Halt, MD ED   11/06/2019 1531 11/12/2019 2118 Full Code [id number]  Gerlene Fee, DO ED   11/06/2019 1206 11/06/2019 1530 Full Code [id number]  Jacqlyn Larsen, PA-C ED   05/20/2018 1709 05/28/2018 2259 Full Code [id number]  Damita Lack, MD ED   02/02/2016 1702 02/02/2016 2138 Full Code [id number]  Clayton Bibles, PA-C ED         IV Access:   Peripheral IV   Procedures and diagnostic studies:   No results found.   Medical Consultants:   None.   Subjective:    Jocelyn Lamer he relates his leg is not coming off.  Objective:    Vitals:   03/25/21 2100 03/26/21 0043 03/26/21 0425 03/26/21 0946  BP: 94/60  97/63 100/65  Pulse: 84  79 97  Resp: 18  17   Temp: 98.6 F (37 C)  98.4 F (36.9 C) 99 F (37.2 C)  TempSrc: Oral  Oral Oral  SpO2: 96%  95% 97%  Weight:  63.1 kg    Height:        SpO2: 97 % O2 Flow Rate (L/min): 2 L/min   Intake/Output Summary (Last 24 hours) at 03/26/2021 1011 Last data filed at 03/26/2021 0956 Gross per 24 hour  Intake 2100 ml  Output 3425 ml  Net -1325 ml    Filed Weights   03/23/21 0541 03/23/21 1956 03/26/21 0043  Weight: 67.6 kg 67.8 kg 63.1 kg    Exam: General exam: In no acute distress. Respiratory system: Good air movement and clear to auscultation. Cardiovascular system: S1 & S2 heard, RRR. No JVD. Gastrointestinal system: Abdomen is nondistended, soft and nontender.  Extremities: The right leg looks macerated shiny ulcers noted throughout the right lower extremity with erythema.     Data Reviewed:    Labs: Basic Metabolic Panel: Recent Labs  Lab 03/20/21 0007 03/20/21 0955 03/21/21 0152 03/21/21 0154 03/22/21 1016 03/22/21 1411 03/22/21 2010 03/23/21 0613 03/25/21 0424  NA 130*   < > 133*   < > 133* 133* 134* 131* 131*  K  3.0*  --  2.9*  --  3.6  --   --  3.8 3.7  CL 94*  --  98  --  99  --   --  98 97*  CO2 26  --  26  --  27  --   --  26 26  GLUCOSE 91  --  119*  --  102*  --   --  109* 156*  BUN <5*  --  <5*  --  <5*  --   --  <5* 5*  CREATININE 0.41*  --  0.59*  --  0.61  --   --  0.53* 0.51*  CALCIUM 7.1*  --  7.3*  --  7.7*  --   --  7.8* 8.0*  MG 1.5*  --  1.9  --  1.6*  --   --   --   --   PHOS 3.2  --   --   --   --   --   --   --   --    < > = values in this interval not displayed.    GFR Estimated Creatinine Clearance: 95.3 mL/min (A) (by C-G formula based on SCr of 0.51 mg/dL (L)). Liver Function Tests: Recent Labs  Lab 03/19/21 1704  AST 81*  ALT 40  ALKPHOS 113  BILITOT 0.6  PROT 6.4*  ALBUMIN 2.1*    No results for input(s): LIPASE, AMYLASE in the last 168 hours. Recent Labs  Lab 03/19/21 1732 03/20/21 0007  AMMONIA 44* 28    Coagulation profile Recent Labs  Lab 03/19/21 1704  INR 0.9    COVID-19 Labs  No results for input(s): DDIMER, FERRITIN, LDH, CRP in the  last 72 hours.  Lab Results  Component Value Date   SARSCOV2NAA NEGATIVE 03/19/2021   [place] NEGATIVE 11/25/2020   [place] NEGATIVE 11/19/2020   [place] NEGATIVE 11/14/2020    CBC: Recent Labs  Lab 03/19/21 1704 03/20/21 0007 03/21/21 0712 03/23/21 0613 03/24/21 0745  WBC 3.4* 3.8* 4.7 5.3 5.9  NEUTROABS 2.6  --   --   --   --   HGB 12.8* 10.9* 10.0* 9.7* 10.0*  HCT 37.3* 31.5* 30.0* 29.5* 30.1*  MCV 81.1 80.4 82.4 83.8 83.1  PLT 115* 134* 63* 37* 56*    Cardiac Enzymes: Recent Labs  Lab 03/20/21 0007 03/22/21 1016  CKTOTAL 114 132    BNP (last 3 results) No results for input(s): PROBNP in the last 8760 hours. CBG: Recent Labs  Lab 03/22/21 2042 03/23/21 0004 03/25/21 1719 03/25/21 2101 03/26/21 0649  GLUCAP 114* 174* 161* 151* 132*    D-Dimer: No results for input(s): DDIMER in the last 72 hours. Hgb A1c: No results for input(s): HGBA1C in the last 72 hours. Lipid Profile: No results for input(s): CHOL, HDL, LDLCALC, TRIG, CHOLHDL, LDLDIRECT in the last 72 hours. Thyroid function studies: No results for input(s): TSH, T4TOTAL, T3FREE, THYROIDAB in the last 72 hours.  Invalid input(s): FREET3  Anemia work up: No results for input(s): VITAMINB12, FOLATE, FERRITIN, TIBC, IRON, RETICCTPCT in the last 72 hours. Sepsis Labs: Recent Labs  Lab 03/19/21 1651 03/19/21 1704 03/20/21 0007 03/21/21 0152 03/21/21 0712 03/23/21 0613 03/24/21 0745  PROCALCITON  --   --  <0.10 0.18  --   --   --   WBC  --    < > 3.8*  --  4.7 5.3 5.9  LATICACIDVEN 2.0*  --  2.1*  --   --   --   --    < > =  values in this interval not displayed.    Microbiology Recent Results (from the past 240 hour(s))  Resp Panel by RT-PCR (Flu A&B, Covid) Nasopharyngeal Swab     Status: None   Collection Time: 03/19/21  4:54 PM   Specimen: Nasopharyngeal Swab; Nasopharyngeal(NP) swabs in vial transport medium  Result Value Ref Range Status   SARS Coronavirus 2 by RT PCR  NEGATIVE NEGATIVE Final    Comment: (NOTE) SARS-CoV-2 target nucleic acids are NOT DETECTED.  The SARS-CoV-2 RNA is generally detectable in upper respiratory specimens during the acute phase of infection. The lowest concentration of SARS-CoV-2 viral copies this assay can detect is 138 copies/mL. A negative result does not preclude SARS-Cov-2 infection and should not be used as the sole basis for treatment or other patient management decisions. A negative result may occur with  improper specimen collection/handling, submission of specimen other than nasopharyngeal swab, presence of viral mutation(s) within the areas targeted by this assay, and inadequate number of viral copies(<138 copies/mL). A negative result must be combined with clinical observations, patient history, and epidemiological information. The expected result is Negative.  Fact Sheet for Patients:  EntrepreneurPulse.com.au  Fact Sheet for Healthcare Providers:  IncredibleEmployment.be  This test is no t yet approved or cleared by the [place] FDA and  has been authorized for detection and/or diagnosis of SARS-CoV-2 by FDA under an Emergency Use Authorization (EUA). This EUA will remain  in effect (meaning this test can be used) for the duration of the COVID-19 declaration under Section 564(b)(1) of the Act, 21 U.S.C.section 360bbb-3(b)(1), unless the authorization is terminated  or revoked sooner.       Influenza A by PCR NEGATIVE NEGATIVE Final   Influenza B by PCR NEGATIVE NEGATIVE Final    Comment: (NOTE) The Xpert Xpress SARS-CoV-2/FLU/RSV plus assay is intended as an aid in the diagnosis of influenza from Nasopharyngeal swab specimens and should not be used as a sole basis for treatment. Nasal washings and aspirates are unacceptable for Xpert Xpress SARS-CoV-2/FLU/RSV testing.  Fact Sheet for Patients: EntrepreneurPulse.com.au  Fact Sheet for  Healthcare Providers: IncredibleEmployment.be  This test is not yet approved or cleared by the [place] FDA and has been authorized for detection and/or diagnosis of SARS-CoV-2 by FDA under an Emergency Use Authorization (EUA). This EUA will remain in effect (meaning this test can be used) for the duration of the COVID-19 declaration under Section 564(b)(1) of the Act, 21 U.S.C. section 360bbb-3(b)(1), unless the authorization is terminated or revoked.  Performed at [facility], [place] [address], [place], [place] [address]   Culture, blood (Routine x 2)     Status: None   Collection Time: 03/19/21  5:00 PM   Specimen: BLOOD LEFT FOREARM  Result Value Ref Range Status   Specimen Description BLOOD LEFT FOREARM  Final   Special Requests   Final    BOTTLES DRAWN AEROBIC AND ANAEROBIC Blood Culture results may not be optimal due to an inadequate volume of blood received in culture bottles   Culture   Final    NO GROWTH 5 DAYS Performed at [facility], [place] [address] [place], [place] [address]    Report Status 03/24/2021 FINAL  Final  Culture, blood (Routine x 2)     Status: None   Collection Time: 03/19/21  5:02 PM   Specimen: BLOOD  Result Value Ref Range Status   Specimen Description BLOOD BLOOD RIGHT HAND  Final   Special Requests   Final  BOTTLES DRAWN AEROBIC AND ANAEROBIC Blood Culture results may not be optimal due to an inadequate volume of blood received in culture bottles   Culture   Final    NO GROWTH 5 DAYS Performed at [facility], [address], [place], [place] [address]    Report Status 03/24/2021 FINAL  Final  Urine Culture     Status: None   Collection Time: 03/19/21  8:43 PM   Specimen: Urine, Clean Catch  Result Value Ref Range Status   Specimen Description URINE, CLEAN CATCH  Final   Special Requests NONE  Final   Culture   Final    NO GROWTH Performed at [facility], [address], [place],  [place] [address]    Report Status 03/21/2021 FINAL  Final  MRSA Next Gen by PCR, Nasal     Status: None   Collection Time: 03/19/21  9:11 PM   Specimen: Nasal Mucosa; Nasal Swab  Result Value Ref Range Status   MRSA by PCR Next Gen NOT DETECTED NOT DETECTED Final    Comment: (NOTE) The GeneXpert MRSA Assay (FDA approved for NASAL specimens only), is one component of a comprehensive MRSA colonization surveillance program. It is not intended to diagnose MRSA infection nor to guide or monitor treatment for MRSA infections. Test performance is not FDA approved in patients less than 90 years old. Performed at [facility], [address], [place], [place] [address]      Medications:    Chlorhexidine Gluconate Cloth  6 each Topical Daily   folic acid  1 mg Oral Daily   multivitamin with minerals  1 tablet Oral Daily   pantoprazole  40 mg Oral QHS   thiamine  100 mg Oral Daily   Continuous Infusions:  ceFEPime (MAXIPIME) IV 2 g (03/26/21 0905)   dextrose 5 % and 0.9% NaCl 10 mL/hr at 03/22/21 1656   vancomycin 1,000 mg (03/26/21 0333)      LOS: 7 days   Marinda Elk  [facility]  03/26/2021, 10:11 AM

## 2021-03-26 NOTE — TOC Progression Note (Addendum)
Transition of Care [facility]) - Initial/Assessment Note    Patient Details  Name: Jake Nguyen MRN: [id number] Date of Birth: 08-06-1965  Transition of Care [facility] CM/SW Contact:    Ralene Bathe, LCSWA Phone Number: 03/26/2021, 10:16 AM  Clinical Narrative:                 CSW reviewed chart. The patient has not received any bed offers.    CSW called the patient's sister, Clementeen Hoof, at the request of the patient.  The sibling reports that she lives in [place] and has been speaking to the patient about the importance of taking care of himself and his health.  The sister provided CSW with family background and previous social hx for the patient.  CSW informed the sibling of the SNF process and next steps.    CSW contacted Delorise Shiner with admissions at [facility] where the patient was admitted previously after not receiving any bed offers.  The facility is requesting insurance auth and will inform CSW if Berkley Harvey is received.     16:48-  CSW was contacted by Delorise Shiner with admissions at [facility] and informed that insurance Berkley Harvey has been approved and they can accept the patient tomorrow.  Attending notified.    CSW spoke to the patient and informed the patient that this facility is the only one that has extended a bed offer.  The patient states that he does not want to go to this facility.  CSW engaged with the patient about the importance of rehab and encouraged the patient to go to the facility rather than back to the street.  The patient reports that he will "think about it".         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   Gram-negative bacteremia 11/24/2020   Septic shock (HCC) 11/24/2020   Cellulitis of left leg 11/19/2020   Alcohol withdrawal syndrome (HCC) 11/19/2020   Hypokalemia 11/19/2020   Acute metabolic encephalopathy 11/14/2020   Sepsis (HCC) 11/14/2020   S/P BKA (below knee amputation) unilateral, left (HCC)    Neuropathic pain, leg, left    Gangrene (HCC) 07/31/2020   Gangrene of foot (HCC) 07/14/2020   Ataxia    Alcohol abuse with alcohol-induced mood disorder (HCC) 11/10/2019   Alcoholism /alcohol abuse    Alcohol withdrawal syndrome, with delirium (HCC) 11/06/2019   Pressure injury of skin 05/25/2018   Rhabdomyolysis 05/20/2018   Hyponatremia 05/20/2018   Elevated troponin 05/20/2018   Moderate dehydration 05/20/2018   Alcohol  use 05/20/2018   Influenza A 05/20/2018   Transaminitis 05/20/2018   PCP:  Patient, No Pcp Per (Inactive) Pharmacy:   [facility] [phone] - [place], [place] - [phone] [facility] [address] [address] Phone: [phone] Fax: [phone]     Social Determinants of Health (SDOH) Interventions    Readmission Risk Interventions Readmission Risk Prevention Plan 11/25/2020 08/10/2020  Transportation Screening Complete Complete  PCP or Specialist Appt within 5-7 Days - Complete  PCP or Specialist Appt within 3-5 Days Complete -  Home Care Screening - Complete  Medication Review (RN CM) - Complete  HRI or Home Care Consult Complete -  Social Work Consult for Recovery Care Planning/Counseling Complete -  Palliative Care Screening Not Applicable -  Medication Review Oceanographer) Complete -  Some recent data might be hidden

## 2021-03-27 LAB — RESP PANEL BY RT-PCR (FLU A&B, COVID) ARPGX2
Influenza A by PCR: NEGATIVE
Influenza B by PCR: NEGATIVE
SARS Coronavirus 2 by RT PCR: NEGATIVE

## 2021-03-27 LAB — GLUCOSE, CAPILLARY
Glucose-Capillary: 134 mg/dL — ABNORMAL HIGH (ref 70–99)
Glucose-Capillary: 208 mg/dL — ABNORMAL HIGH (ref 70–99)
Glucose-Capillary: 178 mg/dL — ABNORMAL HIGH (ref 70–99)

## 2021-03-27 MED ORDER — DOXYCYCLINE HYCLATE 100 MG PO TABS: 100.0000 mg | ORAL_TABLET | Freq: Two times a day (BID) | ORAL | 0 refills | Status: AC

## 2021-03-27 MED ORDER — AMOXICILLIN-POT CLAVULANATE 875-125 MG PO TABS: 1.0000 | ORAL_TABLET | Freq: Two times a day (BID) | ORAL | 0 refills | Status: AC

## 2021-03-27 MED ORDER — MELATONIN 3 MG PO TABS: 3.0000 mg | ORAL_TABLET | Freq: Every day | ORAL | 0 refills | Status: DC

## 2021-03-27 NOTE — Discharge Summary (Signed)
Physician Discharge Summary  BRANDDON HALLIWELL [id number] DOB: 09-08-1965 DOA: 03/19/2021  PCP: Patient, No Pcp Per (Inactive)  Admit date: 03/19/2021 Discharge date: 03/27/2021  Admitted From: home Disposition:  SNF  Recommendations for Outpatient Follow-up:  Follow up with PCP in 1-2 weeks Please obtain BMP/CBC in one week   Home Health:No Equipment/Devices:None  Discharge Condition:Stable CODE STATUS:Full Diet recommendation: Heart Healthy  Brief/Interim Summary: 55 y.o. 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  Admitted to pulmonary and critical care as needed pressors which now has been weaned off.     Significant studies: Echocardiogram 03/20/2021 >> EF 60-65% without regional wall motion abnormality.  Diastolic parameters indeterminate.  RV normal size and function  Discharge Diagnoses:  Principal Problem:   Sepsis ([place]) Active Problems:   Septic shock ([place])  Septic shock due to right lower extremity edema/cellulitis : Started empirically on IV antibiotics and fluid resuscitated. Antibiotic coverage with de-escalated. Vascular surgery was consulted who recommended an amputation which the patient refused. Vascular surgery and I had a long extensive discussion with the patient about amputation and this leg being a risk to his life but he adamantly refused. He was given several days to think about it but he was still refusing. He was changed to oral Augmentin and doxycycline which she will continue as an outpatient and follow-up with  vascular surgery as an outpatient. Physical therapy evaluated the patient recommended skilled nursing facility.  Acute metabolic encephalopathy likely due to sepsis, hypothermia and hyponatremia: CT of the head was negative. His neurological status improved with treatment of infection hypothermia and hyponatremia. He remained in stable condition.  Acute on chronic hyponatremia: Likely due to alcohol abuse and hypovolemia resolved with IV fluid hydration.  Hypokalemia: Repleted orally now resolved.  Chronic alcohol abuse: No signs of DTs he was started on thiamine and folate.  History of right AKA: On 11/21/2020 vascular surgery remove the staples. Stump remained in good condition.  Chronic thrombocytopenia: In the setting of alcohol abuse and sepsis this resolved. His platelets worsen so he was work-up for HIT Heparin was discontinued platelet started to trend up and his HIT panel was negative.   Homelessness: Education officer, museum has been consulted.  History of PAD: He is noncompliant with his medication continue Lipitor.  And caloric malnutrition.  Sickle decubitus ulcer stage II present on admission     Discharge Instructions  Discharge Instructions     Diet - low sodium heart healthy   Complete by: As directed    Increase activity slowly   Complete by: As directed    No wound care   Complete by: As directed       Allergies as of 03/27/2021       Reactions   Pork-derived Products Other (See Comments)   Pt states he doesn't eat pork        Medication List     STOP taking these medications    oxyCODONE-acetaminophen 5-325 MG tablet Commonly known as: Percocet       TAKE these medications    amoxicillin-clavulanate 875-125 MG tablet  Commonly known as: AUGMENTIN Take 1 tablet by mouth every 12 (twelve) hours for 14 days.   atorvastatin 40 MG tablet Commonly known as: LIPITOR Take 1 tablet (40 mg total) by mouth daily.   doxycycline 100 MG  tablet Commonly known as: VIBRA-TABS Take 1 tablet (100 mg total) by mouth every 12 (twelve) hours for 14 days.   folic acid 1 MG tablet Commonly known as: FOLVITE Take 1 tablet (1 mg total) by mouth daily.   melatonin 3 MG Tabs tablet Take 1 tablet (3 mg total) by mouth at bedtime.   multivitamin with minerals Tabs tablet Take 1 tablet by mouth daily.        Allergies  Allergen Reactions   Pork-Derived Products Other (See Comments)    Pt states he doesn't eat pork    Consultations: Vascular surgery   Procedures/Studies: DG Knee 2 Views Left  Result Date: 03/19/2021 CLINICAL DATA:  Sepsis EXAM: LEFT KNEE - 1-2 VIEW COMPARISON:  1122 FINDINGS: Frontal and lateral views of the distal margin of an above knee amputation are identified. At the surgical stump of the distal left femur there is heterotopic ossification. I do not see any periosteal reaction or bony destruction to suggest osteomyelitis. There is diffuse soft tissue edema, with skin staples in place. IMPRESSION: 1. Left above knee amputation.  No evidence of osteomyelitis. 2. Diffuse soft tissue swelling. Electronically Signed   By: Sharlet Salina M.D.   On: 03/19/2021 18:13   DG Tibia/Fibula Right  Result Date: 03/19/2021 CLINICAL DATA:  Sepsis EXAM: RIGHT TIBIA AND FIBULA - 2 VIEW COMPARISON:  None. FINDINGS: Frontal and lateral views of the right tibia and fibula are obtained. There is diffuse subcutaneous edema. I do not see any evidence of subcutaneous gas. There are no acute or destructive bony lesions. Mild osteoarthritis of the right knee and ankle. IMPRESSION: 1. Diffuse soft tissue swelling. 2. No acute or destructive bony lesions. Electronically Signed   By: Sharlet Salina M.D.   On: 03/19/2021 18:10   CT HEAD WO CONTRAST ( )  Result Date: 03/19/2021 CLINICAL DATA:  Mental status change sepsis EXAM: CT HEAD WITHOUT CONTRAST TECHNIQUE: Contiguous axial images were obtained from the base of the skull through  the vertex without intravenous contrast. COMPARISON:  CT brain 05/20/2018 FINDINGS: Brain: No acute territorial infarction, hemorrhage or intracranial mass. Mild atrophy. The ventricles are nonenlarged. Vascular: No hyperdense vessels.  Carotid vascular calcification Skull: Normal. Negative for fracture or focal lesion. Sinuses/Orbits: No acute finding. Other: None IMPRESSION: No CT evidence for acute intracranial abnormality. Electronically Signed   By: Jasmine Pang M.D.   On: 03/19/2021 18:41   DG Chest Port 1 View  Result Date: 03/19/2021 CLINICAL DATA:  Sepsis EXAM: PORTABLE CHEST 1 VIEW COMPARISON:  11/19/2020 FINDINGS: The heart size and mediastinal contours are within normal limits. Both lungs are clear. The visualized skeletal structures are unremarkable. IMPRESSION: No active disease. Electronically Signed   By: Sharlet Salina M.D.   On: 03/19/2021 18:09   DG Foot Complete Right  Result Date: 03/19/2021 CLINICAL DATA:  Sepsis EXAM: RIGHT FOOT COMPLETE - 3+ VIEW COMPARISON:  05/26/2018 FINDINGS: Frontal, oblique, and lateral views of the right foot are obtained. There is diffuse soft tissue edema. There may be a soft tissue ulceration overlying the medial aspect of the right ankle. No evidence of subcutaneous gas. There are no acute or destructive bony lesions. There is mild osteoarthritis of the ankle, hindfoot, and midfoot. IMPRESSION: 1. Diffuse soft tissue  swelling, with possible soft tissue ulceration medial aspect of the ankle. No subcutaneous gas. 2. Osteoarthritis.  No acute or destructive process. Electronically Signed   By: Randa Ngo M.D.   On: 03/19/2021 18:11   ECHOCARDIOGRAM COMPLETE  Result Date: 03/20/2021    ECHOCARDIOGRAM REPORT   Patient Name:   CLIVE DISHAW Date of Exam: 03/20/2021 Medical Rec #:  [id number]        Height:       70.0 in Accession #:    [id number]       Weight:       146.2 lb Date of Birth:  Jan 10, 1966         BSA:          1.827 m Patient Age:    12  years         BP:           98/68 mmHg Patient Gender: M                HR:           124 bpm. Exam Location:  Inpatient Procedure: 2D Echo, Cardiac Doppler and Color Doppler Indications:     Bacteremia  History:         Patient has prior history of Echocardiogram examinations, most                  recent 05/24/2018. Arrythmias:Tachycardia. ETOH abuse. BKA.  Sonographer:     Merrie Roof RDCS Referring Phys:  [id number] Estill Cotta Diagnosing Phys: Dixie Dials MD IMPRESSIONS  1. Left ventricular ejection fraction, by estimation, is 60 to 65%. The left ventricle has normal function. The left ventricle has no regional wall motion abnormalities. Left ventricular diastolic parameters are indeterminate.  2. Right ventricular systolic function is normal. The right ventricular size is normal.  3. Left atrial size was mildly dilated.  4. Right atrial size was mildly dilated.  5. The mitral valve is degenerative. Trivial mitral valve regurgitation.  6. The aortic valve is tricuspid. There is mild calcification of the aortic valve. There is mild thickening of the aortic valve. Aortic valve regurgitation is not visualized. Aortic valve sclerosis is present, with no evidence of aortic valve stenosis.  7. The inferior vena cava is normal in size with greater than 50% respiratory variability, suggesting right atrial pressure of 3 mmHg. FINDINGS  Left Ventricle: Left ventricular ejection fraction, by estimation, is 60 to 65%. The left ventricle has normal function. The left ventricle has no regional wall motion abnormalities. The left ventricular internal cavity size was normal in size. There is  no left ventricular hypertrophy. Left ventricular diastolic parameters are indeterminate. Right Ventricle: The right ventricular size is normal. No increase in right ventricular wall thickness. Right ventricular systolic function is normal. Left Atrium: Left atrial size was mildly dilated. Right Atrium: Right atrial size was mildly  dilated. Pericardium: There is no evidence of pericardial effusion. Mitral Valve: The mitral valve is degenerative in appearance. There is mild thickening of the mitral valve leaflet(s). There is mild calcification of the mitral valve leaflet(s). Mild mitral annular calcification. Trivial mitral valve regurgitation. Tricuspid Valve: The tricuspid valve is normal in structure. Tricuspid valve regurgitation is trivial. Aortic Valve: The aortic valve is tricuspid. There is mild calcification of the aortic valve. There is mild thickening of the aortic valve. Aortic valve regurgitation is not visualized. Aortic valve sclerosis is present, with no evidence of aortic valve stenosis. Pulmonic Valve: The pulmonic  valve was grossly normal. Pulmonic valve regurgitation is not visualized. Aorta: The aortic root is normal in size and structure. Venous: The inferior vena cava is normal in size with greater than 50% respiratory variability, suggesting right atrial pressure of 3 mmHg. IAS/Shunts: The atrial septum is grossly normal.  LEFT VENTRICLE PLAX 2D LVIDd:         3.90 cm LVIDs:         2.50 cm LV PW:         0.90 cm LV IVS:        0.70 cm LVOT diam:     1.90 cm LV SV:         37 LV SV Index:   20 LVOT Area:     2.84 cm  RIGHT VENTRICLE RV Basal diam:  3.80 cm LEFT ATRIUM           Index        RIGHT ATRIUM           Index LA diam:      3.20 cm 1.75 cm/m   RA Area:     12.90 cm LA Vol (A4C): 40.4 ml 22.11 ml/m  RA Volume:   32.20 ml  17.63 ml/m  AORTIC VALVE LVOT Vmax:   101.00 cm/s LVOT Vmean:  67.400 cm/s LVOT VTI:    0.129 m  AORTA Ao Root diam: 3.10 cm Ao Asc diam:  3.10 cm  SHUNTS Systemic VTI:  0.13 m Systemic Diam: 1.90 cm Dixie Dials MD Electronically signed by Dixie Dials MD Signature Date/Time: 03/20/2021/4:41:24 PM    Final    (Echo, Carotid, EGD, Colonoscopy, ERCP)    Subjective: No complaints  Discharge Exam: Vitals:   03/27/21 0504 03/27/21 0931  BP: 104/68 100/73  Pulse: 72 79  Resp: 18 18   Temp: 98.5 F (36.9 C) 98.3 F (36.8 C)  SpO2: 95% 97%   Vitals:   03/26/21 1700 03/26/21 2036 03/27/21 0504 03/27/21 0931  BP: 110/69 111/70 104/68 100/73  Pulse: 77 85 72 79  Resp: 18 18 18 18   Temp: 99.1 F (37.3 C) 98.6 F (37 C) 98.5 F (36.9 C) 98.3 F (36.8 C)  TempSrc: Oral Oral  Oral  SpO2: 100% 98% 95% 97%  Weight:      Height:        General: Pt is alert, awake, not in acute distress Cardiovascular: RRR, S1/S2 +, no rubs, no gallops Respiratory: CTA bilaterally, no wheezing, no rhonchi Abdominal: Soft, NT, ND, bowel sounds + Extremities: no edema, no cyanosis    The results of significant diagnostics from this hospitalization (including imaging, microbiology, ancillary and laboratory) are listed below for reference.     Microbiology: Recent Results (from the past 240 hour(s))  Resp Panel by RT-PCR (Flu A&B, Covid) Nasopharyngeal Swab     Status: None   Collection Time: 03/19/21  4:54 PM   Specimen: Nasopharyngeal Swab; Nasopharyngeal(NP) swabs in vial transport medium  Result Value Ref Range Status   SARS Coronavirus 2 by RT PCR NEGATIVE NEGATIVE Final    Comment: (NOTE) SARS-CoV-2 target nucleic acids are NOT DETECTED.  The SARS-CoV-2 RNA is generally detectable in upper respiratory specimens during the acute phase of infection. The lowest concentration of SARS-CoV-2 viral copies this assay can detect is 138 copies/mL. A negative result does not preclude SARS-Cov-2 infection and should not be used as the sole basis for treatment or other patient management decisions. A negative result may occur with  improper specimen collection/handling,  submission of specimen other than nasopharyngeal swab, presence of viral mutation(s) within the areas targeted by this assay, and inadequate number of viral copies(<138 copies/mL). A negative result must be combined with clinical observations, patient history, and epidemiological information. The expected result is  Negative.  Fact Sheet for Patients:  EntrepreneurPulse.com.au  Fact Sheet for Healthcare Providers:  IncredibleEmployment.be  This test is no t yet approved or cleared by the [place] FDA and  has been authorized for detection and/or diagnosis of SARS-CoV-2 by FDA under an Emergency Use Authorization (EUA). This EUA will remain  in effect (meaning this test can be used) for the duration of the COVID-19 declaration under Section 564(b)(1) of the Act, 21 U.S.C.section 360bbb-3(b)(1), unless the authorization is terminated  or revoked sooner.       Influenza A by PCR NEGATIVE NEGATIVE Final   Influenza B by PCR NEGATIVE NEGATIVE Final    Comment: (NOTE) The Xpert Xpress SARS-CoV-2/FLU/RSV plus assay is intended as an aid in the diagnosis of influenza from Nasopharyngeal swab specimens and should not be used as a sole basis for treatment. Nasal washings and aspirates are unacceptable for Xpert Xpress SARS-CoV-2/FLU/RSV testing.  Fact Sheet for Patients: EntrepreneurPulse.com.au  Fact Sheet for Healthcare Providers: IncredibleEmployment.be  This test is not yet approved or cleared by the [place] FDA and has been authorized for detection and/or diagnosis of SARS-CoV-2 by FDA under an Emergency Use Authorization (EUA). This EUA will remain in effect (meaning this test can be used) for the duration of the COVID-19 declaration under Section 564(b)(1) of the Act, 21 U.S.C. section 360bbb-3(b)(1), unless the authorization is terminated or revoked.  Performed at [facility], [place] [address] [place], [place] [address]   Culture, blood (Routine x 2)     Status: None   Collection Time: 03/19/21  5:00 PM   Specimen: BLOOD LEFT FOREARM  Result Value Ref Range Status   Specimen Description BLOOD LEFT FOREARM  Final   Special Requests   Final    BOTTLES DRAWN AEROBIC AND ANAEROBIC Blood Culture  results may not be optimal due to an inadequate volume of blood received in culture bottles   Culture   Final    NO GROWTH 5 DAYS Performed at [facility], [place] [address], [place], [place] [address]    Report Status 03/24/2021 FINAL  Final  Culture, blood (Routine x 2)     Status: None   Collection Time: 03/19/21  5:02 PM   Specimen: BLOOD  Result Value Ref Range Status   Specimen Description BLOOD BLOOD RIGHT HAND  Final   Special Requests   Final    BOTTLES DRAWN AEROBIC AND ANAEROBIC Blood Culture results may not be optimal due to an inadequate volume of blood received in culture bottles   Culture   Final    NO GROWTH 5 DAYS Performed at [facility], [place] [address], [place], [place] [address]    Report Status 03/24/2021 FINAL  Final  Urine Culture     Status: None   Collection Time: 03/19/21  8:43 PM   Specimen: Urine, Clean Catch  Result Value Ref Range Status   Specimen Description URINE, CLEAN CATCH  Final   Special Requests NONE  Final   Culture   Final    NO GROWTH Performed at [facility], [place] [address], [place], [place] [address]    Report Status 03/21/2021 FINAL  Final  MRSA Next Gen by PCR, Nasal  Status: None   Collection Time: 03/19/21  9:11 PM   Specimen: Nasal Mucosa; Nasal Swab  Result Value Ref Range Status   MRSA by PCR Next Gen NOT DETECTED NOT DETECTED Final    Comment: (NOTE) The GeneXpert MRSA Assay (FDA approved for NASAL specimens only), is one component of a comprehensive MRSA colonization surveillance program. It is not intended to diagnose MRSA infection nor to guide or monitor treatment for MRSA infections. Test performance is not FDA approved in patients less than 45 years old. Performed at [facility], [address], [place], [place] [address]      Labs: BNP (last 3 results) No results for input(s): BNP in the last 8760 hours. Basic Metabolic Panel: Recent Labs  Lab 03/21/21 0152 03/21/21 0154  03/22/21 1016 03/22/21 1411 03/22/21 2010 03/23/21 0613 03/25/21 0424  NA 133*   < > 133* 133* 134* 131* 131*  K 2.9*  --  3.6  --   --  3.8 3.7  CL 98  --  99  --   --  98 97*  CO2 26  --  27  --   --  26 26  GLUCOSE 119*  --  102*  --   --  109* 156*  BUN <5*  --  <5*  --   --  <5* 5*  CREATININE 0.59*  --  0.61  --   --  0.53* 0.51*  CALCIUM 7.3*  --  7.7*  --   --  7.8* 8.0*  MG 1.9  --  1.6*  --   --   --   --    < > = values in this interval not displayed.   Liver Function Tests: No results for input(s): AST, ALT, ALKPHOS, BILITOT, PROT, ALBUMIN in the last 168 hours. No results for input(s): LIPASE, AMYLASE in the last 168 hours. No results for input(s): AMMONIA in the last 168 hours. CBC: Recent Labs  Lab 03/21/21 0712 03/23/21 0613 03/24/21 0745  WBC 4.7 5.3 5.9  HGB 10.0* 9.7* 10.0*  HCT 30.0* 29.5* 30.1*  MCV 82.4 83.8 83.1  PLT 63* 37* 56*   Cardiac Enzymes: Recent Labs  Lab 03/22/21 1016  CKTOTAL 132   BNP: Invalid input(s): POCBNP CBG: Recent Labs  Lab 03/26/21 1124 03/26/21 1657 03/26/21 2039 03/27/21 0642 03/27/21 1125  GLUCAP 168* 165* 143* 134* 208*   D-Dimer No results for input(s): DDIMER in the last 72 hours. Hgb A1c No results for input(s): HGBA1C in the last 72 hours. Lipid Profile No results for input(s): CHOL, HDL, LDLCALC, TRIG, CHOLHDL, LDLDIRECT in the last 72 hours. Thyroid function studies No results for input(s): TSH, T4TOTAL, T3FREE, THYROIDAB in the last 72 hours.  Invalid input(s): FREET3 Anemia work up No results for input(s): VITAMINB12, FOLATE, FERRITIN, TIBC, IRON, RETICCTPCT in the last 72 hours. Urinalysis    Component Value Date/Time   COLORURINE YELLOW 03/19/2021 0101   APPEARANCEUR CLEAR 03/19/2021 0101   LABSPEC 1.012 03/19/2021 0101   PHURINE 5.0 03/19/2021 0101   GLUCOSEU 50 (A) 03/19/2021 0101   HGBUR NEGATIVE 03/19/2021 0101   BILIRUBINUR NEGATIVE 03/19/2021 0101   KETONESUR 5 (A) 03/19/2021 0101    PROTEINUR NEGATIVE 03/19/2021 0101   UROBILINOGEN 0.2 08/02/2009 2144   NITRITE NEGATIVE 03/19/2021 0101   LEUKOCYTESUR NEGATIVE 03/19/2021 0101   Sepsis Labs Invalid input(s): PROCALCITONIN,  WBC,  LACTICIDVEN Microbiology Recent Results (from the past 240 hour(s))  Resp Panel by RT-PCR (Flu A&B, Covid) Nasopharyngeal  Swab     Status: None   Collection Time: 03/19/21  4:54 PM   Specimen: Nasopharyngeal Swab; Nasopharyngeal(NP) swabs in vial transport medium  Result Value Ref Range Status   SARS Coronavirus 2 by RT PCR NEGATIVE NEGATIVE Final    Comment: (NOTE) SARS-CoV-2 target nucleic acids are NOT DETECTED.  The SARS-CoV-2 RNA is generally detectable in upper respiratory specimens during the acute phase of infection. The lowest concentration of SARS-CoV-2 viral copies this assay can detect is 138 copies/mL. A negative result does not preclude SARS-Cov-2 infection and should not be used as the sole basis for treatment or other patient management decisions. A negative result may occur with  improper specimen collection/handling, submission of specimen other than nasopharyngeal swab, presence of viral mutation(s) within the areas targeted by this assay, and inadequate number of viral copies(<138 copies/mL). A negative result must be combined with clinical observations, patient history, and epidemiological information. The expected result is Negative.  Fact Sheet for Patients:  EntrepreneurPulse.com.au  Fact Sheet for Healthcare Providers:  IncredibleEmployment.be  This test is no t yet approved or cleared by the [place] FDA and  has been authorized for detection and/or diagnosis of SARS-CoV-2 by FDA under an Emergency Use Authorization (EUA). This EUA will remain  in effect (meaning this test can be used) for the duration of the COVID-19 declaration under Section 564(b)(1) of the Act, 21 U.S.C.section 360bbb-3(b)(1), unless the  authorization is terminated  or revoked sooner.       Influenza A by PCR NEGATIVE NEGATIVE Final   Influenza B by PCR NEGATIVE NEGATIVE Final    Comment: (NOTE) The Xpert Xpress SARS-CoV-2/FLU/RSV plus assay is intended as an aid in the diagnosis of influenza from Nasopharyngeal swab specimens and should not be used as a sole basis for treatment. Nasal washings and aspirates are unacceptable for Xpert Xpress SARS-CoV-2/FLU/RSV testing.  Fact Sheet for Patients: EntrepreneurPulse.com.au  Fact Sheet for Healthcare Providers: IncredibleEmployment.be  This test is not yet approved or cleared by the [place] FDA and has been authorized for detection and/or diagnosis of SARS-CoV-2 by FDA under an Emergency Use Authorization (EUA). This EUA will remain in effect (meaning this test can be used) for the duration of the COVID-19 declaration under Section 564(b)(1) of the Act, 21 U.S.C. section 360bbb-3(b)(1), unless the authorization is terminated or revoked.  Performed at [facility], [place] [address] [place], [place] [address]   Culture, blood (Routine x 2)     Status: None   Collection Time: 03/19/21  5:00 PM   Specimen: BLOOD LEFT FOREARM  Result Value Ref Range Status   Specimen Description BLOOD LEFT FOREARM  Final   Special Requests   Final    BOTTLES DRAWN AEROBIC AND ANAEROBIC Blood Culture results may not be optimal due to an inadequate volume of blood received in culture bottles   Culture   Final    NO GROWTH 5 DAYS Performed at [facility], [place] [address], [place], [place] [address]    Report Status 03/24/2021 FINAL  Final  Culture, blood (Routine x 2)     Status: None   Collection Time: 03/19/21  5:02 PM   Specimen: BLOOD  Result Value Ref Range Status   Specimen Description BLOOD BLOOD RIGHT HAND  Final   Special Requests   Final    BOTTLES DRAWN AEROBIC AND ANAEROBIC Blood Culture results may not be optimal due  to an inadequate volume of blood received in culture bottles  Culture   Final    NO GROWTH 5 DAYS Performed at [facility], [place] [address], [place], [place] [address]    Report Status 03/24/2021 FINAL  Final  Urine Culture     Status: None   Collection Time: 03/19/21  8:43 PM   Specimen: Urine, Clean Catch  Result Value Ref Range Status   Specimen Description URINE, CLEAN CATCH  Final   Special Requests NONE  Final   Culture   Final    NO GROWTH Performed at [facility], [place] [address], [place], [place] [address]    Report Status 03/21/2021 FINAL  Final  MRSA Next Gen by PCR, Nasal     Status: None   Collection Time: 03/19/21  9:11 PM   Specimen: Nasal Mucosa; Nasal Swab  Result Value Ref Range Status   MRSA by PCR Next Gen NOT DETECTED NOT DETECTED Final    Comment: (NOTE) The GeneXpert MRSA Assay (FDA approved for NASAL specimens only), is one component of a comprehensive MRSA colonization surveillance program. It is not intended to diagnose MRSA infection nor to guide or monitor treatment for MRSA infections. Test performance is not FDA approved in patients less than 64 years old. Performed at [facility], [place] [address] [place], [place] [address]     SIGNED:   Charlynne Cousins, MD  [facility] 03/27/2021, 12:02 PM Pager   If 7PM-7AM, please contact night-coverage [email or web address] Password [email or web address]

## 2021-03-27 NOTE — TOC Transition Note (Addendum)
Transition of Care [facility]) - CM/SW Discharge Note   Patient Details  Name: Jake Nguyen MRN: [id number] Date of Birth: 06-28-1965  Transition of Care [facility] CM/SW Contact:  Lynett Grimes Phone Number: 03/27/2021, 12:15 PM   Clinical Narrative:    Patient will DC to: [place] Anticipated DC date: 03/27/2021 Family notified: pt sister Transport by: Sharin Mons   Per MD patient ready for DC to [facility] room 130A. RN to call report prior to discharge [phone]). RN, patient, patient's family, and facility notified of DC. Discharge Summary and FL2 sent to facility. DC packet on chart. Ambulance transport requested for patient.   CSW will sign off for now as social work intervention is no longer needed. Please consult [place] again if new needs arise.           Patient Goals and CMS Choice        Discharge Placement                       Discharge Plan and Services                                     Social Determinants of Health (SDOH) Interventions     Readmission Risk Interventions Readmission Risk Prevention Plan 11/25/2020 08/10/2020  Transportation Screening Complete Complete  PCP or Specialist Appt within 5-7 Days - Complete  PCP or Specialist Appt within 3-5 Days Complete -  Home Care Screening - Complete  Medication Review (RN CM) - Complete  HRI or Home Care Consult Complete -  Social Work Consult for Recovery Care Planning/Counseling Complete -  Palliative Care Screening Not Applicable -  Medication Review Oceanographer) Complete -  Some recent data might be hidden

## 2021-03-27 NOTE — Progress Notes (Signed)
Patient Right Lower Leg Dressing changed with xerofrom and Kerlix, L AKA painted with betadine and kerlix to cover up the site.

## 2021-03-27 NOTE — Progress Notes (Signed)
Attempted to call report, no answer,

## 2021-03-27 NOTE — Progress Notes (Signed)
Attempted to give report, no answer. This RN left a message and a callback number.

## 2021-03-27 NOTE — Progress Notes (Signed)
°  Progress Note    03/27/2021 9:53 AM Hospital Day 6  Subjective:  resting comfortably; no complaints, no fever or chills   Vitals:   03/27/21 0504 03/27/21 0931  BP: 104/68 100/73  Pulse: 72 79  Resp: 18 18  Temp: 98.5 F (36.9 C) 98.3 F (36.8 C)  SpO2: 95% 97%    Physical Exam: General:  no distress; wakes easily Lungs:  non labored   CBC    Component Value Date/Time   WBC 5.9 03/24/2021 0745   RBC 3.62 (L) 03/24/2021 0745   HGB 10.0 (L) 03/24/2021 0745   HCT 30.1 (L) 03/24/2021 0745   HCT 36.4 (L) 05/20/2018 1923   PLT 56 (L) 03/24/2021 0745   MCV 83.1 03/24/2021 0745   MCH 27.6 03/24/2021 0745   MCHC 33.2 03/24/2021 0745   RDW 14.3 03/24/2021 0745   LYMPHSABS 0.6 (L) 03/19/2021 1704   MONOABS 0.2 03/19/2021 1704   EOSABS 0.0 03/19/2021 1704   BASOSABS 0.0 03/19/2021 1704    BMET    Component Value Date/Time   NA 131 (L) 03/25/2021 0424   K 3.7 03/25/2021 0424   CL 97 (L) 03/25/2021 0424   CO2 26 03/25/2021 0424   GLUCOSE 156 (H) 03/25/2021 0424   BUN 5 (L) 03/25/2021 0424   CREATININE 0.51 (L) 03/25/2021 0424   CALCIUM 8.0 (L) 03/25/2021 0424   GFRNONAA >60 03/25/2021 0424   GFRAA >60 11/12/2019 0359    INR    Component Value Date/Time   INR 0.9 03/19/2021 1704     Intake/Output Summary (Last 24 hours) at 03/27/2021 0953 Last data filed at 03/27/2021 0901 Gross per 24 hour  Intake 1080 ml  Output 2100 ml  Net -1020 ml      Assessment/Plan:  55 y.o. male with extensive RLE wounds. Wounds no amenable to limb salvage.   Pt offered above knee amputation. Pt not interested. Should he be discharged, please call me as I am happy to set up outpatient follow up.  Victorino Sparrow MD

## 2021-03-27 NOTE — Plan of Care (Signed)
Problem: Education: Goal: Knowledge of General Education information will improve Description: Including pain rating scale, medication(s)/side effects and non-pharmacologic comfort measures Outcome: Not Met (add Reason)   Problem: Health Behavior/Discharge Planning: Goal: Ability to manage health-related needs will improve Outcome: Not Met (add Reason)   Problem: Clinical Measurements: Goal: Will remain free from infection Outcome: Not Met (add Reason)  Pt decline surgical intervention.

## 2021-03-31 DIAGNOSIS — I739 Peripheral vascular disease, unspecified: Secondary | ICD-10-CM | POA: Diagnosis not present

## 2021-03-31 DIAGNOSIS — L97519 Non-pressure chronic ulcer of other part of right foot with unspecified severity: Secondary | ICD-10-CM | POA: Diagnosis not present

## 2021-04-14 DIAGNOSIS — I739 Peripheral vascular disease, unspecified: Secondary | ICD-10-CM | POA: Diagnosis not present

## 2021-04-14 DIAGNOSIS — L97519 Non-pressure chronic ulcer of other part of right foot with unspecified severity: Secondary | ICD-10-CM | POA: Diagnosis not present

## 2021-04-19 ENCOUNTER — Ambulatory Visit: Payer: Medicaid Other

## 2021-04-21 DIAGNOSIS — L97519 Non-pressure chronic ulcer of other part of right foot with unspecified severity: Secondary | ICD-10-CM | POA: Diagnosis not present

## 2021-04-21 DIAGNOSIS — I739 Peripheral vascular disease, unspecified: Secondary | ICD-10-CM | POA: Diagnosis not present

## 2021-04-28 DIAGNOSIS — L97519 Non-pressure chronic ulcer of other part of right foot with unspecified severity: Secondary | ICD-10-CM | POA: Diagnosis not present

## 2021-04-28 DIAGNOSIS — I739 Peripheral vascular disease, unspecified: Secondary | ICD-10-CM | POA: Diagnosis not present

## 2021-05-05 DIAGNOSIS — L97519 Non-pressure chronic ulcer of other part of right foot with unspecified severity: Secondary | ICD-10-CM | POA: Diagnosis not present

## 2021-05-05 DIAGNOSIS — I739 Peripheral vascular disease, unspecified: Secondary | ICD-10-CM | POA: Diagnosis not present

## 2021-05-12 DIAGNOSIS — L97519 Non-pressure chronic ulcer of other part of right foot with unspecified severity: Secondary | ICD-10-CM | POA: Diagnosis not present

## 2021-05-12 DIAGNOSIS — I739 Peripheral vascular disease, unspecified: Secondary | ICD-10-CM | POA: Diagnosis not present

## 2021-06-03 DIAGNOSIS — S91301D Unspecified open wound, right foot, subsequent encounter: Secondary | ICD-10-CM | POA: Diagnosis not present

## 2021-06-03 DIAGNOSIS — D696 Thrombocytopenia, unspecified: Secondary | ICD-10-CM | POA: Diagnosis not present

## 2021-06-03 DIAGNOSIS — R5381 Other malaise: Secondary | ICD-10-CM | POA: Diagnosis not present

## 2021-06-03 DIAGNOSIS — U071 COVID-19: Secondary | ICD-10-CM | POA: Diagnosis not present

## 2021-06-03 DIAGNOSIS — G4709 Other insomnia: Secondary | ICD-10-CM | POA: Diagnosis not present

## 2021-06-03 DIAGNOSIS — M869 Osteomyelitis, unspecified: Secondary | ICD-10-CM | POA: Diagnosis not present

## 2021-06-03 DIAGNOSIS — E7800 Pure hypercholesterolemia, unspecified: Secondary | ICD-10-CM | POA: Diagnosis not present

## 2021-06-03 DIAGNOSIS — I739 Peripheral vascular disease, unspecified: Secondary | ICD-10-CM | POA: Diagnosis not present

## 2021-06-03 DIAGNOSIS — G9341 Metabolic encephalopathy: Secondary | ICD-10-CM | POA: Diagnosis not present

## 2021-06-08 DIAGNOSIS — L97519 Non-pressure chronic ulcer of other part of right foot with unspecified severity: Secondary | ICD-10-CM | POA: Diagnosis not present

## 2021-06-08 DIAGNOSIS — I739 Peripheral vascular disease, unspecified: Secondary | ICD-10-CM | POA: Diagnosis not present

## 2021-06-15 DIAGNOSIS — I739 Peripheral vascular disease, unspecified: Secondary | ICD-10-CM | POA: Diagnosis not present

## 2021-06-15 DIAGNOSIS — L97519 Non-pressure chronic ulcer of other part of right foot with unspecified severity: Secondary | ICD-10-CM | POA: Diagnosis not present

## 2022-07-22 IMAGING — CR DG FOOT COMPLETE 3+V*L*
3 series · 3 of 3 positions shown · non-contrast
Comparison: 05/26/2018.

CLINICAL DATA: Injury.

EXAM:
LEFT FOOT - COMPLETE 3+ VIEW

[x foot ap left]
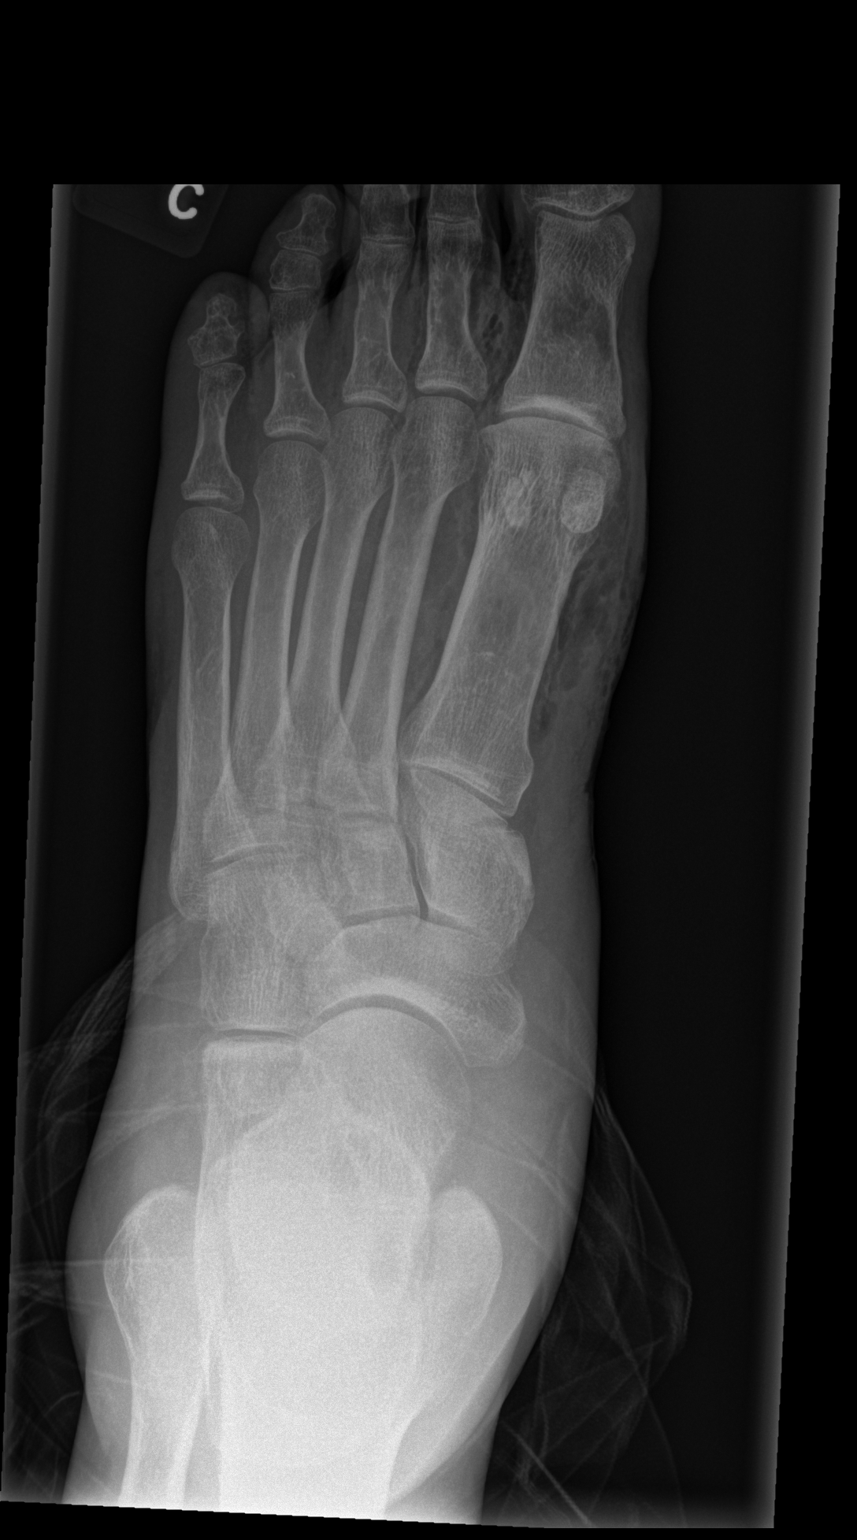

[x foot obl left]
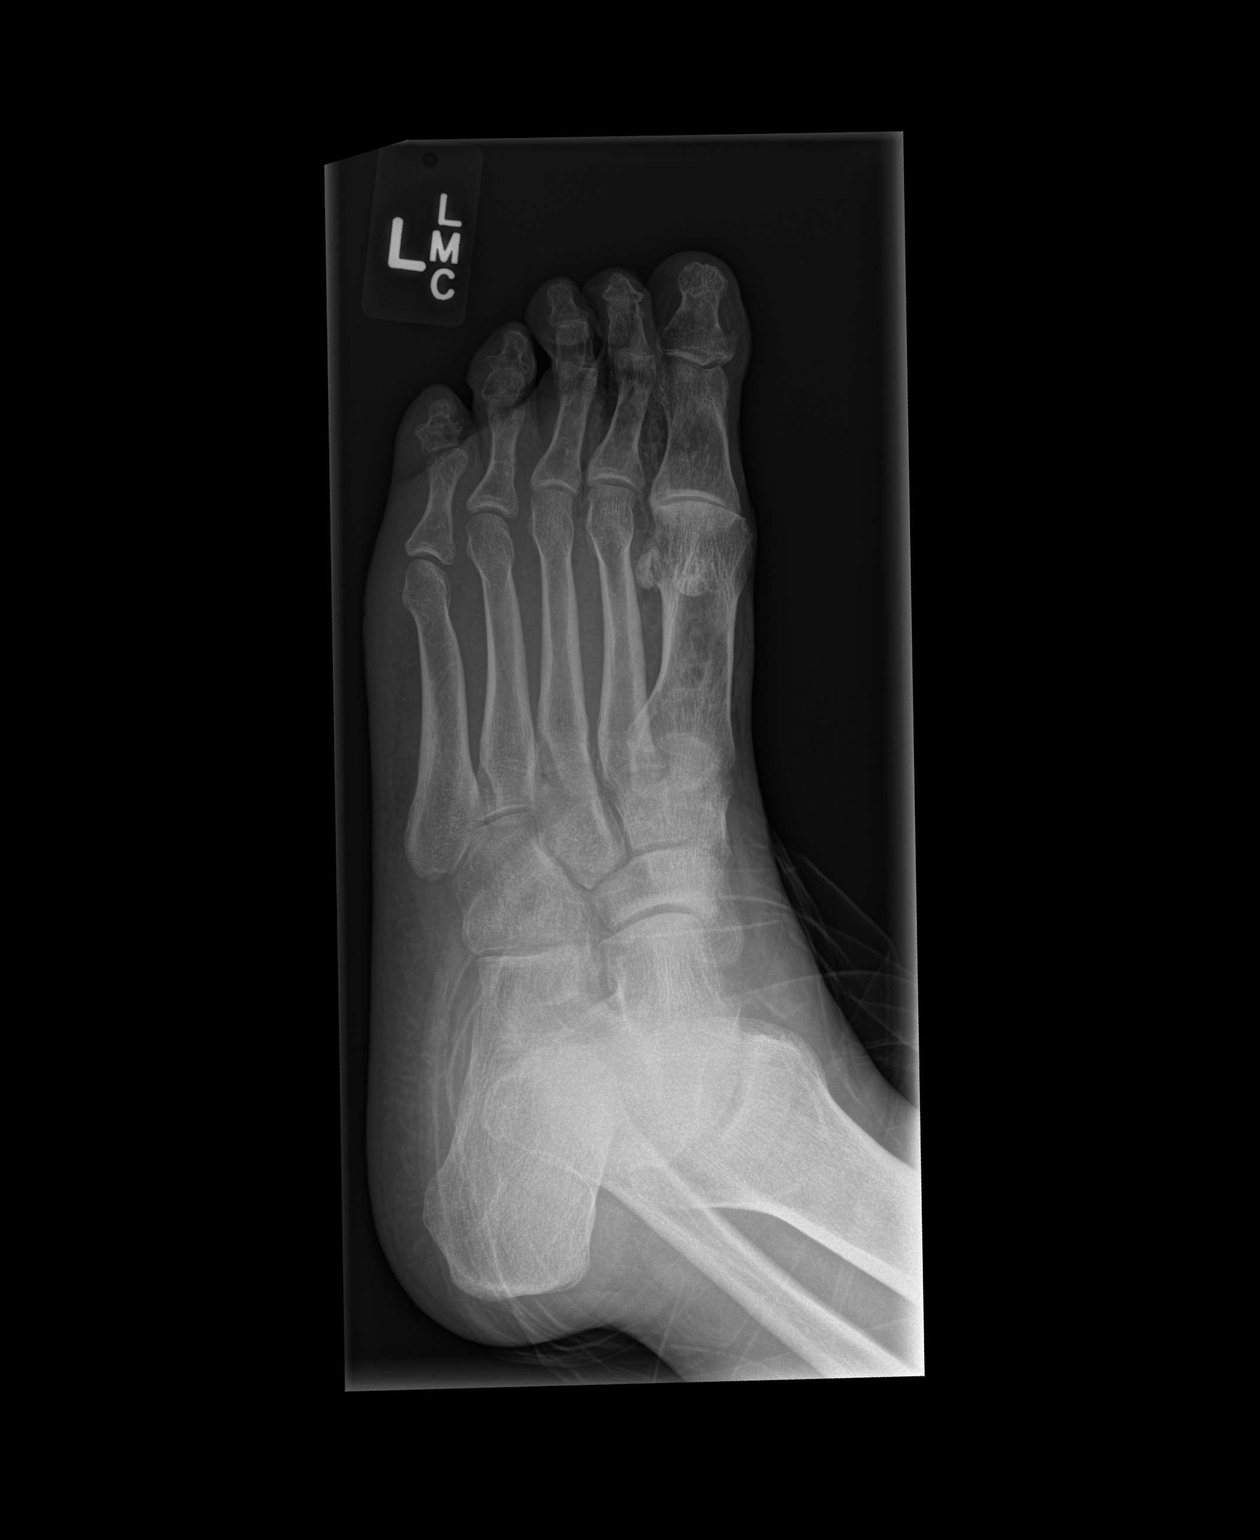

[x foot lat left]
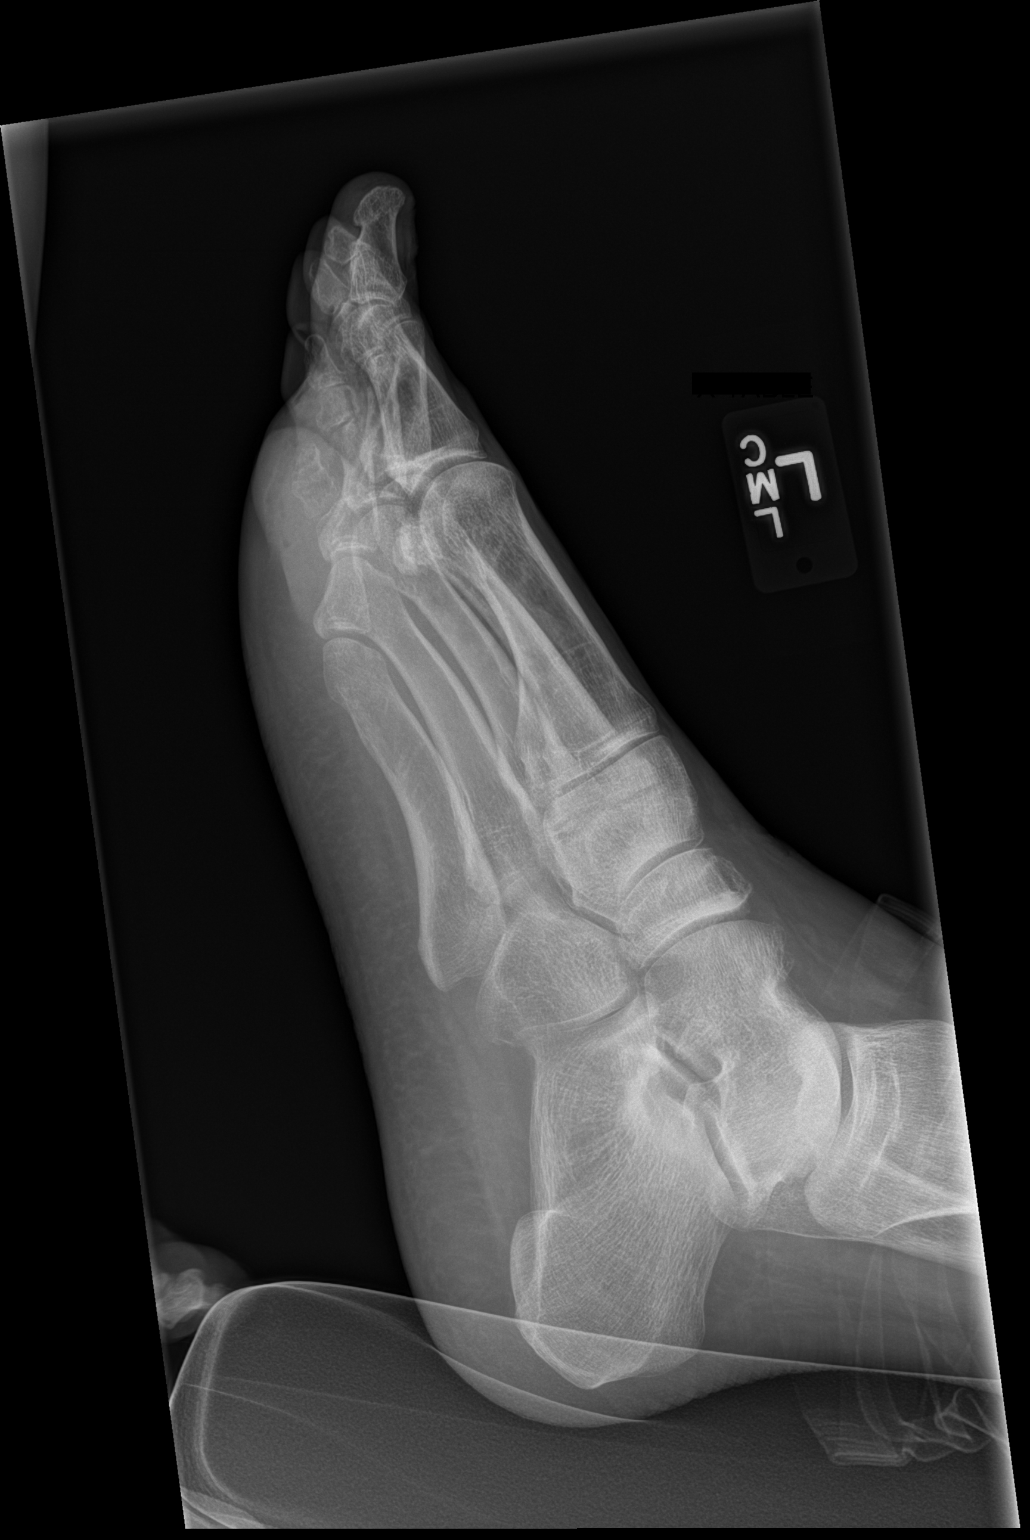

[3 of 3 positions shown; findings below may reference images not displayed]

FINDINGS: Soft tissue air is noted diffusely about the medial aspect of the
left foot. No radiopaque foreign body. No acute bony or joint
abnormality. No bony erosions. Degenerative changes first MTP joint.
IMPRESSION: Soft tissue air is noted about the medial aspect of the left foot.
No radiopaque foreign body. No acute bony abnormality identified. No
bony erosions noted.

## 2022-08-02 ENCOUNTER — Other Ambulatory Visit (HOSPITAL_COMMUNITY): Payer: Self-pay

## 2022-08-08 IMAGING — DX DG FOOT COMPLETE 3+V*L*
3 series · 3 of 3 positions shown · non-contrast
Comparison: 07/14/2020

CLINICAL DATA: Gangrene.

EXAM:
LEFT FOOT - COMPLETE 3+ VIEW

[foot ap]
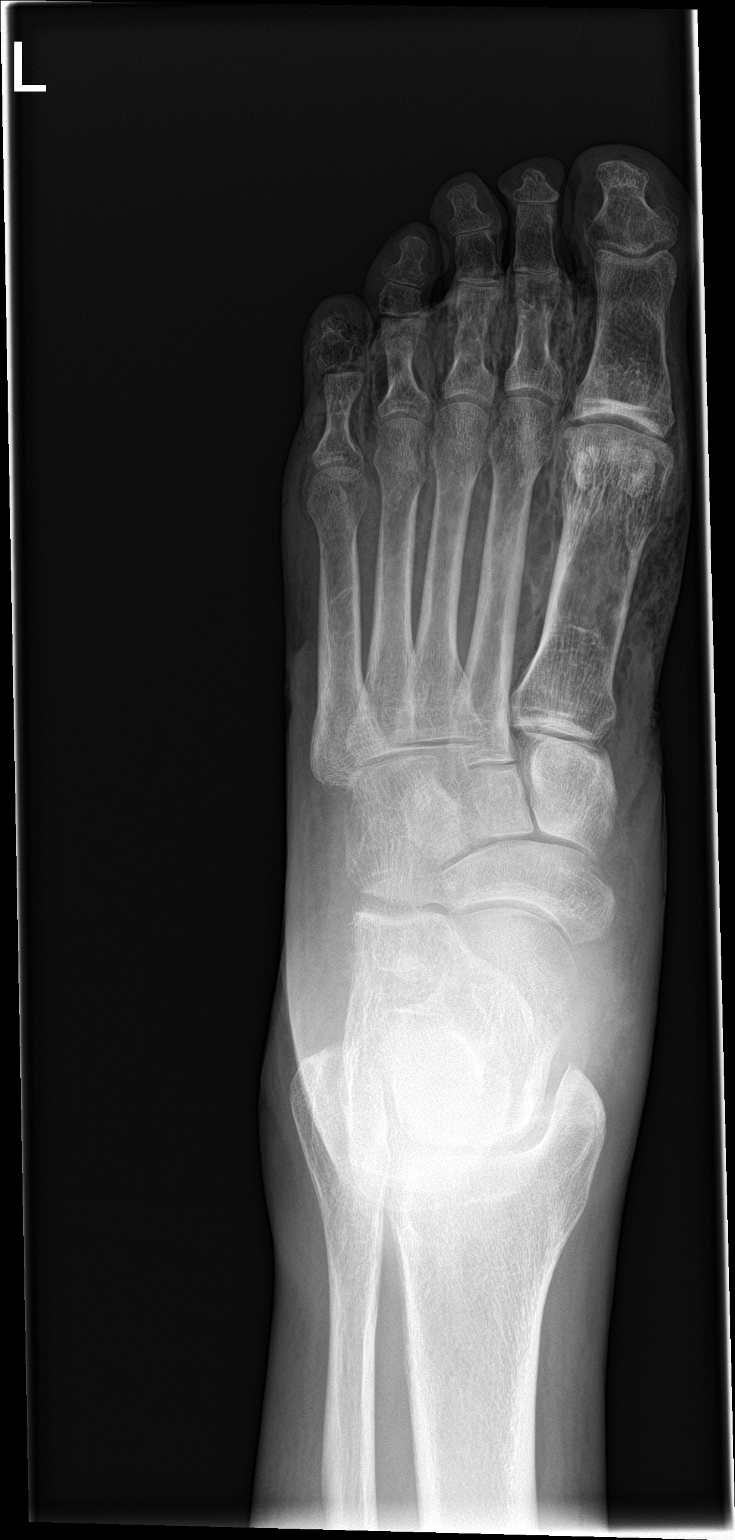

[foot obl]
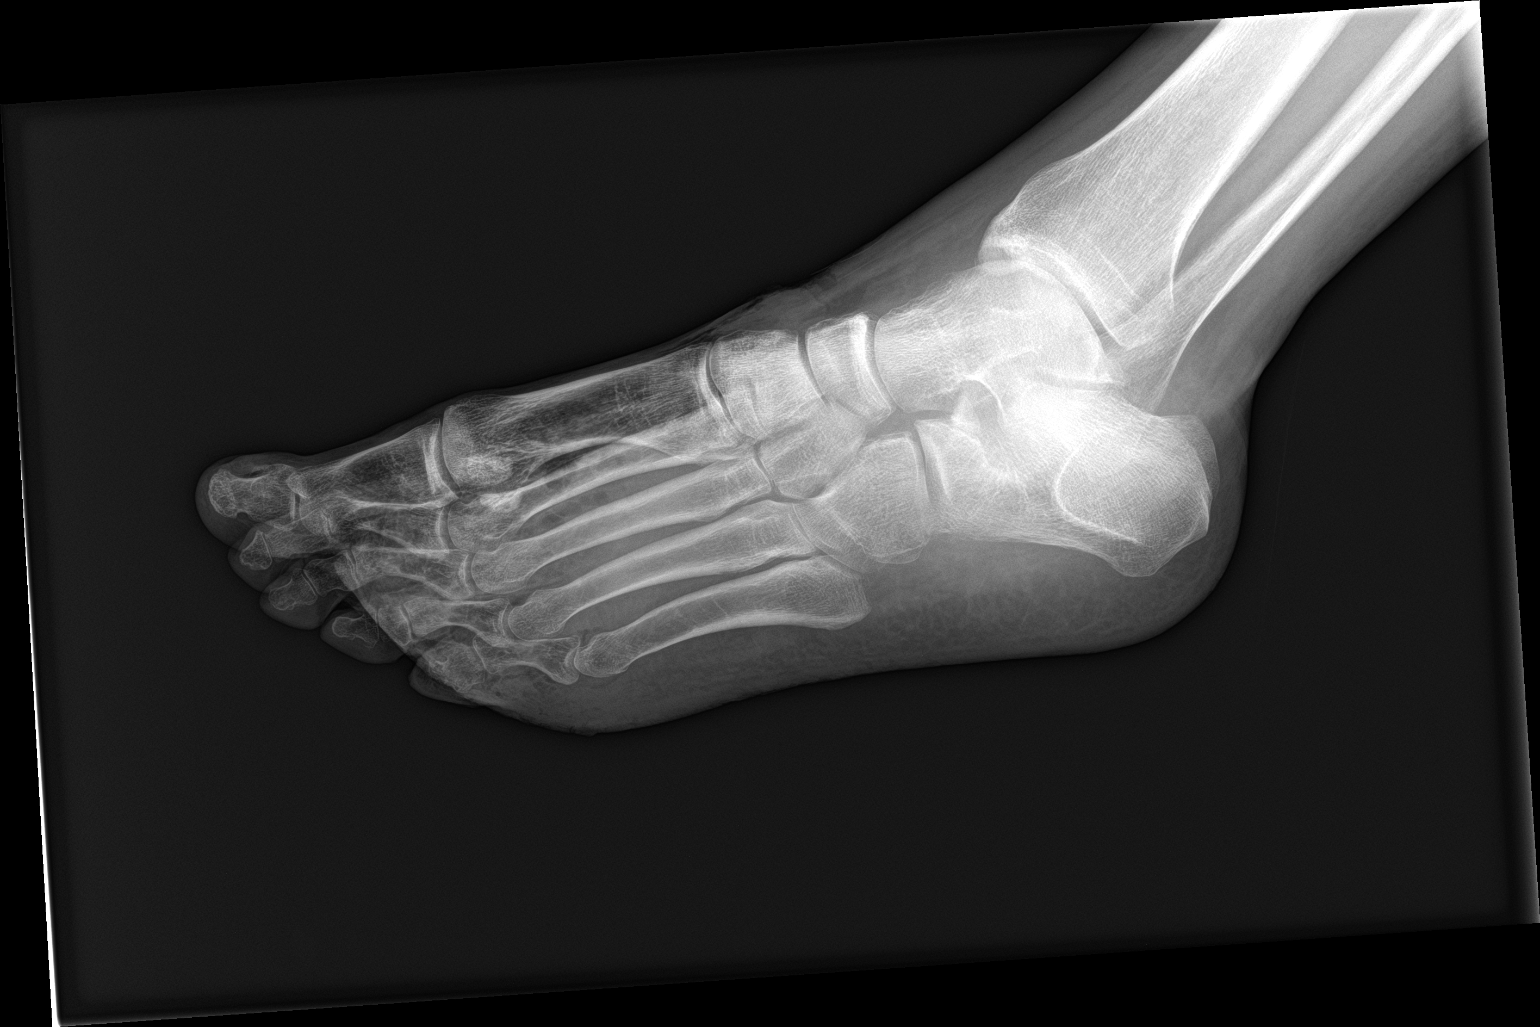

[foot lat]
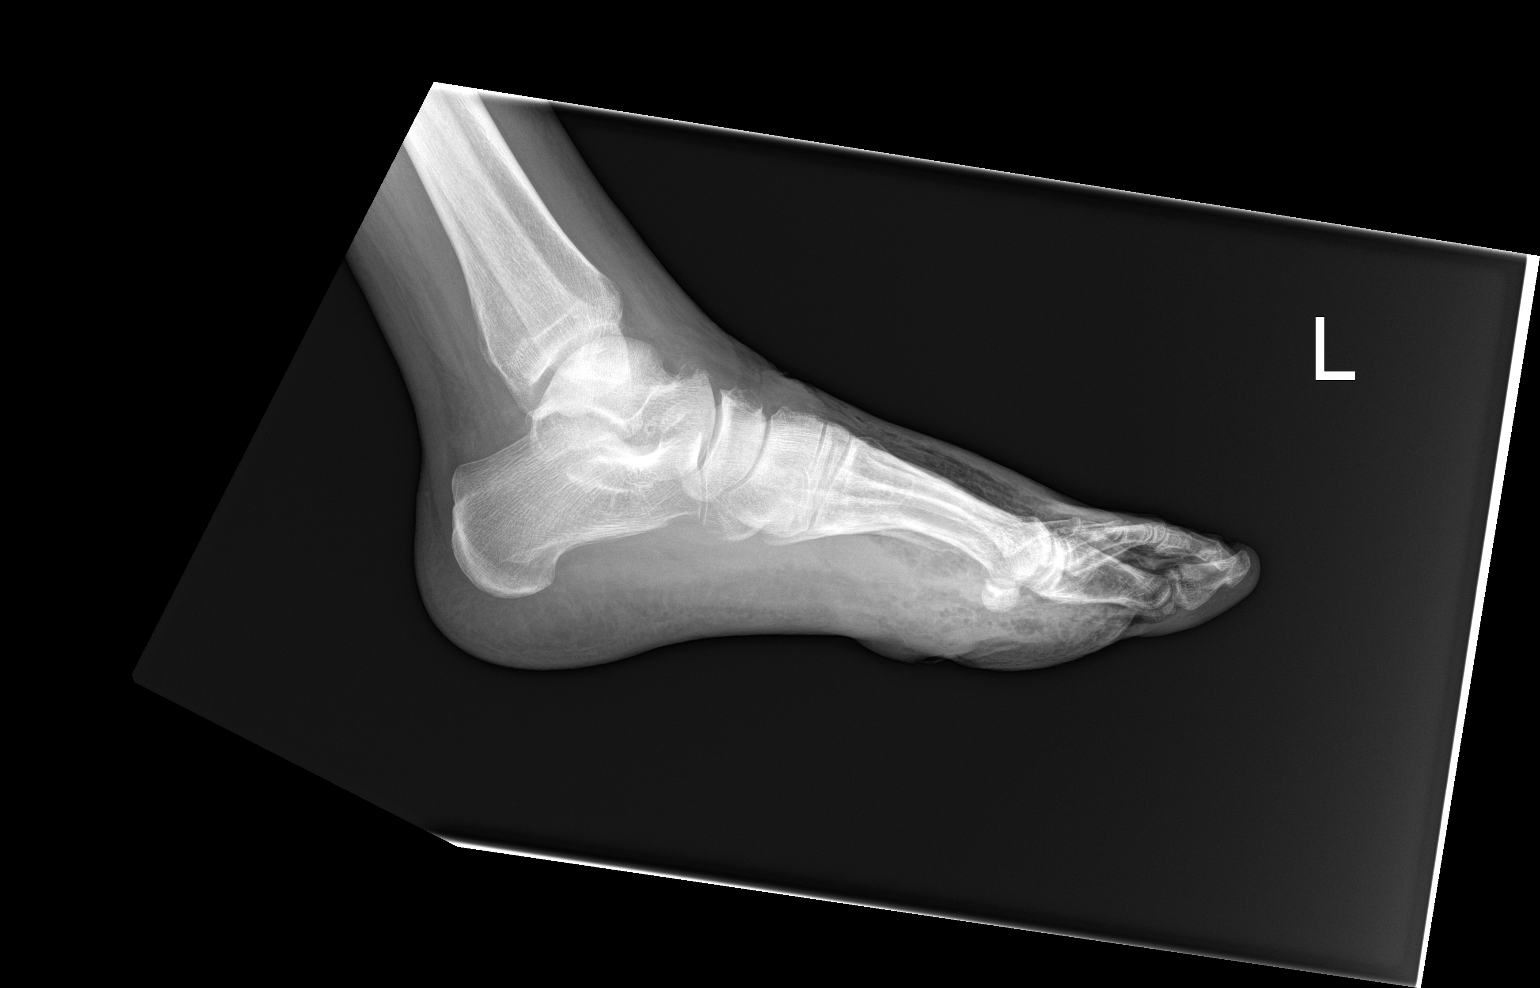

[3 of 3 positions shown; findings below may reference images not displayed]

FINDINGS: Extensive soft tissue gas is seen along the dorsal and plantar
surface of the foot with associated soft tissue swelling.

Negative for osteomyelitis or fracture.
IMPRESSION: Progression of soft tissue gas compatible with infection. Negative
for osteomyelitis.

## 2023-01-23 ENCOUNTER — Other Ambulatory Visit: Payer: Self-pay

## 2023-01-23 ENCOUNTER — Emergency Department (HOSPITAL_COMMUNITY)
Admission: EM | Admit: 2023-01-23 | Discharge: 2023-01-23 | Disposition: A | Discharge status: Home / Self Care | Payer: BLUE CROSS/BLUE SHIELD | Attending: Emergency Medicine | Admitting: Emergency Medicine

## 2023-01-23 ENCOUNTER — Encounter (HOSPITAL_COMMUNITY): Payer: Self-pay

## 2023-01-23 DIAGNOSIS — R21 Rash and other nonspecific skin eruption: Secondary | ICD-10-CM | POA: Diagnosis present

## 2023-01-23 DIAGNOSIS — L309 Dermatitis, unspecified: Secondary | ICD-10-CM

## 2023-01-23 DIAGNOSIS — R519 Headache, unspecified: Secondary | ICD-10-CM | POA: Diagnosis not present

## 2023-01-23 MED ORDER — TRIAMCINOLONE ACETONIDE 0.1 % EX CREA: 1.0000 | TOPICAL_CREAM | Freq: Two times a day (BID) | CUTANEOUS | 0 refills | Status: DC

## 2023-01-23 NOTE — ED Provider Notes (Signed)
[place] EMERGENCY DEPARTMENT AT [facility] Provider Note   CSN: [id number] Arrival date & time: 01/23/23  0144     History  Chief Complaint  Patient presents with   Headache    Jake Nguyen is a 57 y.o. male.  Patient is a 57 year old male with a past medical history of homelessness, alcohol use disorder senting to the emergency department complaining to me of a rash.  The patient initially reported to triage with complaints of headaches and depression.  He reports to me however that he is just having a rash on his arms that he came to the ER to be evaluated and declines any additional complaints.  He states that the rashes been present for the last several days.  He states that it is itchy.  He states that he is homeless and sleeps outside so he is not sure what he could have been exposed to.  He denies any known history of eczema.  He denies any new lotions, detergents or soaps.  He denies any known exposures with similar rash.  The history is provided by the patient.  Headache      Home Medications Prior to Admission medications   Medication Sig Start Date End Date Taking? Authorizing Provider  triamcinolone cream (KENALOG) 0.1 % Apply 1 Application topically 2 (two) times daily. 01/23/23  Yes Theresia Lo, Cordarrell Sane K, DO  atorvastatin (LIPITOR) 40 MG tablet Take 1 tablet (40 mg total) by mouth daily. Patient not taking: Reported on 11/19/2020 08/27/20   Russella Dar, NP  folic acid (FOLVITE) 1 MG tablet Take 1 tablet (1 mg total) by mouth daily. Patient not taking: Reported on 11/19/2020 08/27/20   Russella Dar, NP  melatonin 3 MG TABS tablet Take 1 tablet (3 mg total) by mouth at bedtime. 03/27/21   Marinda Elk, MD  Multiple Vitamin (MULTIVITAMIN WITH MINERALS) TABS tablet Take 1 tablet by mouth daily. Patient not taking: Reported on 03/19/2021 11/27/20   Narda Bonds, MD      Allergies    Pork-derived products    Review of Systems   Review  of Systems  Neurological:  Positive for headaches.    Physical Exam Updated Vital Signs BP 133/84 (BP Location: Left Arm)   Pulse 92   Temp 98 F (36.7 C) (Oral)   Resp 19   Ht 5\' 10"  (1.778 m)   Wt 63 kg   SpO2 97%   BMI 19.94 kg/m  Physical Exam Vitals and nursing note reviewed.  Constitutional:      Comments: Asleep on stretcher bed with blanket overhead, easily arousable to verbal stimulation  HENT:     Head: Normocephalic and atraumatic.     Nose: Nose normal.     Mouth/Throat:     Mouth: Mucous membranes are moist.  Eyes:     Extraocular Movements: Extraocular movements intact.     Conjunctiva/sclera: Conjunctivae normal.  Cardiovascular:     Rate and Rhythm: Normal rate and regular rhythm.  Pulmonary:     Breath sounds: Normal breath sounds.  Abdominal:     General: Abdomen is flat.  Musculoskeletal:        General: Normal range of motion.     Cervical back: Normal range of motion.  Skin:    General: Skin is warm and dry.     Findings: Rash (dry scaling rash in bilateral elbow flexor surfaces, chronic skin changes on legs) present.  Neurological:     General: No  focal deficit present.     Mental Status: He is oriented to person, place, and time.  Psychiatric:        Mood and Affect: Mood normal.        Behavior: Behavior normal.     ED Results / Procedures / Treatments   Labs (all labs ordered are listed, but only abnormal results are displayed) Labs Reviewed - No data to display  EKG None  Radiology No results found.  Procedures Procedures    Medications Ordered in ED Medications - No data to display  ED Course/ Medical Decision Making/ A&P                                 Medical Decision Making This patient presents to the ED with chief complaint(s) of rsh with pertinent past medical history of homelessness, ETOH use disorder which further complicates the presenting complaint. The complaint involves an extensive differential diagnosis  and also carries with it a high risk of complications and morbidity.    The differential diagnosis includes contact dermatitis, eczema, no signs of urticaria or other allergic reaction  Additional history obtained: Additional history obtained from N/A Records reviewed previous admission documents  ED Course and Reassessment: On patient's arrival he is hemodynamically stable in no acute distress.  Patient was initially complaining to headache and depression in triage however declines these to myself and is complaining of a rash to his bilateral arms.  The rash does appear consistent with an eczema type of rash.  The patient will be started on a steroid cream.  He will be given primary care and be [facility] follow-up if needed and was given strict return precautions.  Independent labs interpretation:  N/A  Independent visualization of imaging: - N/A  Consultation: - Consulted or discussed management/test interpretation w/ external professional: N/A  Consideration for admission or further workup: homeslessness, ETOH use disorder Social Determinants of health: N/A            Final Clinical Impression(s) / ED Diagnoses Final diagnoses:  Eczema of both upper extremities    Rx / DC Orders ED Discharge Orders          Ordered    triamcinolone cream (KENALOG) 0.1 %  2 times daily        01/23/23 0831              Rexford Maus, DO 01/23/23 0831

## 2023-01-23 NOTE — ED Notes (Signed)
Pt c/o not feeling well x 1 week, also c/o rash.

## 2023-01-23 NOTE — Discharge Instructions (Signed)
Seen in the emergency department for your rash.  It does not appear consistent with eczema.  I have given you a steroid cream that you can use to help with the itching and help the rash heal.  You can follow-up with your primary doctor in the next few days to have your symptoms rechecked.  You should return to the emergency department if you have redness or pus draining from the rash, you have fevers, or if you have any other new or concerning symptoms.

## 2023-01-23 NOTE — ED Notes (Signed)
Pt spo2 87 at this time, RN notified 2L oxygen applied

## 2023-01-23 NOTE — ED Triage Notes (Addendum)
Complaining of headaches for the last couple of weeks. Also complaining of being depressed. [place] he has lost his house, lost job, and lost his car.  Feeling bad for self. Wants help with his feelings.  Denies SI or HI.

## 2023-04-11 ENCOUNTER — Other Ambulatory Visit: Payer: Self-pay

## 2023-07-20 ENCOUNTER — Other Ambulatory Visit: Payer: Self-pay

## 2023-07-20 ENCOUNTER — Inpatient Hospital Stay (HOSPITAL_COMMUNITY)
Admission: EM | Admit: 2023-07-20 | Discharge: 2023-07-22 | DRG: 603 | Discharge status: Against Medical Advice | Payer: MEDICAID | Attending: Internal Medicine | Admitting: Internal Medicine

## 2023-07-20 ENCOUNTER — Emergency Department (HOSPITAL_COMMUNITY): Payer: MEDICAID

## 2023-07-20 ENCOUNTER — Encounter (HOSPITAL_COMMUNITY): Payer: Self-pay | Admitting: Internal Medicine

## 2023-07-20 DIAGNOSIS — F29 Unspecified psychosis not due to a substance or known physiological condition: Secondary | ICD-10-CM | POA: Diagnosis present

## 2023-07-20 DIAGNOSIS — Z89511 Acquired absence of right leg below knee: Secondary | ICD-10-CM

## 2023-07-20 DIAGNOSIS — Z79899 Other long term (current) drug therapy: Secondary | ICD-10-CM

## 2023-07-20 DIAGNOSIS — F1090 Alcohol use, unspecified, uncomplicated: Secondary | ICD-10-CM | POA: Diagnosis present

## 2023-07-20 DIAGNOSIS — L089 Local infection of the skin and subcutaneous tissue, unspecified: Principal | ICD-10-CM | POA: Diagnosis present

## 2023-07-20 DIAGNOSIS — F419 Anxiety disorder, unspecified: Secondary | ICD-10-CM | POA: Diagnosis present

## 2023-07-20 DIAGNOSIS — F1091 Alcohol use, unspecified, in remission: Secondary | ICD-10-CM | POA: Diagnosis not present

## 2023-07-20 DIAGNOSIS — I739 Peripheral vascular disease, unspecified: Secondary | ICD-10-CM | POA: Diagnosis present

## 2023-07-20 DIAGNOSIS — Z91014 Allergy to mammalian meats: Secondary | ICD-10-CM

## 2023-07-20 DIAGNOSIS — F17210 Nicotine dependence, cigarettes, uncomplicated: Secondary | ICD-10-CM | POA: Diagnosis present

## 2023-07-20 DIAGNOSIS — Z8619 Personal history of other infectious and parasitic diseases: Secondary | ICD-10-CM

## 2023-07-20 DIAGNOSIS — Z5901 Sheltered homelessness: Secondary | ICD-10-CM

## 2023-07-20 DIAGNOSIS — T148XXA Other injury of unspecified body region, initial encounter: Secondary | ICD-10-CM

## 2023-07-20 DIAGNOSIS — F1020 Alcohol dependence, uncomplicated: Secondary | ICD-10-CM | POA: Diagnosis present

## 2023-07-20 DIAGNOSIS — F10930 Alcohol use, unspecified with withdrawal, uncomplicated: Secondary | ICD-10-CM

## 2023-07-20 DIAGNOSIS — F32A Depression, unspecified: Secondary | ICD-10-CM | POA: Diagnosis present

## 2023-07-20 DIAGNOSIS — Z89612 Acquired absence of left leg above knee: Secondary | ICD-10-CM

## 2023-07-20 DIAGNOSIS — Z89512 Acquired absence of left leg below knee: Secondary | ICD-10-CM

## 2023-07-20 DIAGNOSIS — L309 Dermatitis, unspecified: Secondary | ICD-10-CM | POA: Diagnosis present

## 2023-07-20 LAB — BASIC METABOLIC PANEL WITH GFR
Anion gap: 20 — ABNORMAL HIGH (ref 5–15)
Anion gap: 17 — ABNORMAL HIGH (ref 5–15)
BUN: <5 mg/dL — ABNORMAL LOW (ref 6–20)
BUN: 5 mg/dL — ABNORMAL LOW (ref 6–20)
CO2: 24 mmol/L (ref 22–32)
CO2: 27 mmol/L (ref 22–32)
Calcium: 9 mg/dL (ref 8.9–10.3)
Calcium: 8.8 mg/dL — ABNORMAL LOW (ref 8.9–10.3)
Chloride: 83 mmol/L — ABNORMAL LOW (ref 98–111)
Chloride: 83 mmol/L — ABNORMAL LOW (ref 98–111)
Creatinine, Ser: 0.77 mg/dL (ref 0.61–1.24)
Creatinine, Ser: 0.89 mg/dL (ref 0.61–1.24)
GFR, Estimated: >60 mL/min (ref >60)
GFR, Estimated: >60 mL/min (ref >60)
Glucose, Bld: 107 mg/dL — ABNORMAL HIGH (ref 70–99)
Glucose, Bld: 113 mg/dL — ABNORMAL HIGH (ref 70–99)
Potassium: 3.5 mmol/L (ref 3.5–5.1)
Potassium: 3.6 mmol/L (ref 3.5–5.1)
Sodium: 127 mmol/L — ABNORMAL LOW (ref 135–145)
Sodium: 127 mmol/L — ABNORMAL LOW (ref 135–145)

## 2023-07-20 LAB — CBC WITH DIFFERENTIAL/PLATELET
Abs Immature Granulocytes: 0.03 10*3/uL (ref 0.00–0.07)
Basophils Absolute: 0 10*3/uL (ref 0.0–0.1)
Basophils Relative: 0 %
Eosinophils Absolute: 0.1 10*3/uL (ref 0.0–0.5)
Eosinophils Relative: 1 %
HCT: 45.7 % (ref 39.0–52.0)
Hemoglobin: 15.7 g/dL (ref 13.0–17.0)
Immature Granulocytes: 0 %
Lymphocytes Relative: 7 %
Lymphs Abs: 0.6 10*3/uL — ABNORMAL LOW (ref 0.7–4.0)
MCH: 29.6 pg (ref 26.0–34.0)
MCHC: 34.4 g/dL (ref 30.0–36.0)
MCV: 86.1 fL (ref 80.0–100.0)
Monocytes Absolute: 0.6 10*3/uL (ref 0.1–1.0)
Monocytes Relative: 7 %
Neutro Abs: 7.7 10*3/uL (ref 1.7–7.7)
Neutrophils Relative %: 85 %
Platelets: 120 10*3/uL — ABNORMAL LOW (ref 150–400)
RBC: 5.31 MIL/uL (ref 4.22–5.81)
RDW: 14.4 % (ref 11.5–15.5)
WBC: 9 10*3/uL (ref 4.0–10.5)
nRBC: 0 % (ref 0.0–0.2)

## 2023-07-20 LAB — HEPATIC FUNCTION PANEL
ALT: 38 U/L (ref 0–44)
AST: 76 U/L — ABNORMAL HIGH (ref 15–41)
Albumin: 3.8 g/dL (ref 3.5–5.0)
Alkaline Phosphatase: 56 U/L (ref 38–126)
Bilirubin, Direct: 0.3 mg/dL — ABNORMAL HIGH (ref 0.0–0.2)
Indirect Bilirubin: 1.5 mg/dL — ABNORMAL HIGH (ref 0.3–0.9)
Total Bilirubin: 1.8 mg/dL — ABNORMAL HIGH (ref 0.0–1.2)
Total Protein: 8.7 g/dL — ABNORMAL HIGH (ref 6.5–8.1)

## 2023-07-20 LAB — SEDIMENTATION RATE: Sed Rate: 2 mm/h (ref 0–16)

## 2023-07-20 LAB — I-STAT CG4 LACTIC ACID, ED: Lactic Acid, Venous: 4 mmol/L (ref 0.5–1.9)

## 2023-07-20 LAB — GLUCOSE, CAPILLARY: Glucose-Capillary: 124 mg/dL — ABNORMAL HIGH (ref 70–99)

## 2023-07-20 LAB — HEMOGLOBIN A1C
Hgb A1c MFr Bld: 5.6 % (ref 4.8–5.6)
Mean Plasma Glucose: 114.02 mg/dL

## 2023-07-20 LAB — HIV ANTIBODY (ROUTINE TESTING W REFLEX): HIV Screen 4th Generation wRfx: NONREACTIVE

## 2023-07-20 LAB — PROTIME-INR
INR: 1 (ref 0.8–1.2)
Prothrombin Time: 13.3 s (ref 11.4–15.2)

## 2023-07-20 LAB — C-REACTIVE PROTEIN: CRP: 1.1 mg/dL — ABNORMAL HIGH (ref <1.0)

## 2023-07-20 MED ORDER — LORAZEPAM 1 MG PO TABS
1.0000 mg | ORAL_TABLET | ORAL | Status: DC | PRN
  Administered 2023-07-22: 1 mg via ORAL
  Filled 2023-07-20: qty 1

## 2023-07-20 MED ORDER — CHLORDIAZEPOXIDE HCL 25 MG PO CAPS
25.0000 mg | ORAL_CAPSULE | Freq: Three times a day (TID) | ORAL | Status: DC
  Administered 2023-07-22: 25 mg via ORAL
  Filled 2023-07-20: qty 1

## 2023-07-20 MED ORDER — CHLORDIAZEPOXIDE HCL 25 MG PO CAPS
50.0000 mg | ORAL_CAPSULE | Freq: Once | ORAL | Status: AC
  Administered 2023-07-20: 50 mg via ORAL
  Filled 2023-07-20: qty 2

## 2023-07-20 MED ORDER — LOPERAMIDE HCL 2 MG PO CAPS: 2.0000 mg | ORAL_CAPSULE | ORAL | Status: DC | PRN

## 2023-07-20 MED ORDER — TRIAMCINOLONE 0.1 % CREAM:EUCERIN CREAM 1:1
1.0000 | TOPICAL_CREAM | Freq: Two times a day (BID) | CUTANEOUS | Status: DC
  Administered 2023-07-20 – 2023-07-22 (×5): 1 via TOPICAL
  Filled 2023-07-20: qty 1

## 2023-07-20 MED ORDER — DOXYCYCLINE HYCLATE 100 MG PO TABS
100.0000 mg | ORAL_TABLET | Freq: Once | ORAL | Status: AC
  Administered 2023-07-20: 100 mg via ORAL
  Filled 2023-07-20: qty 1

## 2023-07-20 MED ORDER — ACETAMINOPHEN 650 MG RE SUPP: 650.0000 mg | Freq: Four times a day (QID) | RECTAL | Status: DC | PRN

## 2023-07-20 MED ORDER — ADULT MULTIVITAMIN W/MINERALS CH
1.0000 | ORAL_TABLET | Freq: Every day | ORAL | Status: DC
  Administered 2023-07-20 – 2023-07-22 (×3): 1 via ORAL
  Filled 2023-07-20 (×3): qty 1

## 2023-07-20 MED ORDER — SODIUM CHLORIDE 0.9% FLUSH
3.0000 mL | Freq: Two times a day (BID) | INTRAVENOUS | Status: DC
  Administered 2023-07-20 – 2023-07-22 (×4): 3 mL via INTRAVENOUS

## 2023-07-20 MED ORDER — LINEZOLID 600 MG/300ML IV SOLN
600.0000 mg | Freq: Two times a day (BID) | INTRAVENOUS | Status: DC
  Administered 2023-07-20 – 2023-07-21 (×2): 600 mg via INTRAVENOUS
  Filled 2023-07-20 (×3): qty 300

## 2023-07-20 MED ORDER — CHLORDIAZEPOXIDE HCL 25 MG PO CAPS: 25.0000 mg | ORAL_CAPSULE | ORAL | Status: DC

## 2023-07-20 MED ORDER — THIAMINE MONONITRATE 100 MG PO TABS
100.0000 mg | ORAL_TABLET | Freq: Every day | ORAL | Status: DC
  Administered 2023-07-21 – 2023-07-22 (×2): 100 mg via ORAL
  Filled 2023-07-20 (×2): qty 1

## 2023-07-20 MED ORDER — SODIUM CHLORIDE 0.9 % IV SOLN
3.0000 g | Freq: Four times a day (QID) | INTRAVENOUS | Status: DC
  Administered 2023-07-20 – 2023-07-22 (×8): 3 g via INTRAVENOUS
  Filled 2023-07-20 (×8): qty 8

## 2023-07-20 MED ORDER — LORAZEPAM 2 MG/ML IJ SOLN: 1.0000 mg | INTRAMUSCULAR | Status: DC | PRN

## 2023-07-20 MED ORDER — ONDANSETRON 4 MG PO TBDP: 4.0000 mg | ORAL_TABLET | Freq: Four times a day (QID) | ORAL | Status: DC | PRN

## 2023-07-20 MED ORDER — FOLIC ACID 1 MG PO TABS
1.0000 mg | ORAL_TABLET | Freq: Every day | ORAL | Status: DC
  Administered 2023-07-20 – 2023-07-22 (×3): 1 mg via ORAL
  Filled 2023-07-20 (×3): qty 1

## 2023-07-20 MED ORDER — LACTATED RINGERS IV BOLUS
1000.0000 mL | Freq: Once | INTRAVENOUS | Status: AC
  Administered 2023-07-20: 1000 mL via INTRAVENOUS

## 2023-07-20 MED ORDER — ACETAMINOPHEN 325 MG PO TABS: 650.0000 mg | ORAL_TABLET | Freq: Four times a day (QID) | ORAL | Status: DC | PRN

## 2023-07-20 MED ORDER — LORAZEPAM 2 MG/ML IJ SOLN
2.0000 mg | Freq: Once | INTRAMUSCULAR | Status: AC
  Administered 2023-07-20: 2 mg via INTRAVENOUS
  Filled 2023-07-20: qty 1

## 2023-07-20 MED ORDER — CHLORDIAZEPOXIDE HCL 25 MG PO CAPS
25.0000 mg | ORAL_CAPSULE | Freq: Four times a day (QID) | ORAL | Status: AC
  Administered 2023-07-20 – 2023-07-21 (×6): 25 mg via ORAL
  Filled 2023-07-20 (×6): qty 1

## 2023-07-20 MED ORDER — CHLORDIAZEPOXIDE HCL 25 MG PO CAPS: 25.0000 mg | ORAL_CAPSULE | Freq: Every day | ORAL | Status: DC

## 2023-07-20 MED ORDER — SODIUM CHLORIDE 0.9 % IV SOLN
2.0000 g | Freq: Once | INTRAVENOUS | Status: AC
  Administered 2023-07-20: 2 g via INTRAVENOUS
  Filled 2023-07-20: qty 20

## 2023-07-20 MED ORDER — POLYETHYLENE GLYCOL 3350 17 G PO PACK: 17.0000 g | PACK | Freq: Every day | ORAL | Status: DC | PRN

## 2023-07-20 MED ORDER — HYDROXYZINE HCL 25 MG PO TABS: 25.0000 mg | ORAL_TABLET | Freq: Four times a day (QID) | ORAL | Status: DC | PRN

## 2023-07-20 NOTE — ED Triage Notes (Signed)
 Pt. Stated, My brother called the police cause he was not taking care of himself.. rt. Foot is weeping and has a bad odor for a month

## 2023-07-20 NOTE — ED Notes (Signed)
 Spoke with ED Doc pt will not be IVC'd (first exam will not be done)

## 2023-07-20 NOTE — ED Triage Notes (Signed)
 Pt took off sock and a lot of dead skin just peeled off. Red weeping fluid.

## 2023-07-20 NOTE — ED Provider Triage Note (Signed)
 Emergency Medicine Provider Triage Evaluation Note  Jake Nguyen , a 58 y.o. male  was evaluated in triage.  Pt complains of foot infection. Patient is homeless and was brought in [employer] due to family concerns of patient unable to care for himself. He is unsure how long his foot has been this way. Denies any recent fever, chills, or bodyaches. States no significant medical history.  Review of Systems  Positive: As above Negative: As above  Physical Exam  BP (!) 155/102 (BP Location: Right Arm)   Pulse 99   Temp 98.4 F (36.9 C)   Resp 18  Gen:   Awake, no distress   Resp:  Normal effort  MSK:   Skin erosions along the majority of the dorsal aspect of the right foot. Weeping and erythematous. No purulent drainage appreciated. Other:    Medical Decision Making  Medically screening exam initiated at 2:07 PM.  Appropriate orders placed.  CHADWICK REISWIG was informed that the remainder of the evaluation will be completed by another provider, this initial triage assessment does not replace that evaluation, and the importance of remaining in the ED until their evaluation is complete.     Smitty Knudsen, PA-C 07/20/23 1409

## 2023-07-20 NOTE — Plan of Care (Signed)
Problem: Education: Goal: Knowledge of General Education information will improve Description Including pain rating scale, medication(s)/side effects and non-pharmacologic comfort measures Outcome: Progressing   Problem: Clinical Measurements: Goal: Will remain free from infection Outcome: Progressing   Problem: Activity: Goal: Risk for activity intolerance will decrease Outcome: Progressing   Problem: Skin Integrity: Goal: Risk for impaired skin integrity will decrease Outcome: Progressing

## 2023-07-20 NOTE — ED Provider Notes (Addendum)
 [place] EMERGENCY DEPARTMENT AT [facility] Provider Note   CSN: [id number] Arrival date & time: 07/20/23  1212     History  Chief Complaint  Patient presents with   Recurrent Skin Infections   Medical IVC    Jake Nguyen is a 58 y.o. male.  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  Family took out IVC paperwork on the patient because they were concerned that he was not caring for himself.  Patient reports that he has been having an issue with his right foot for a while and it is getting worse but he did not want to come to the hospital today.  He denies having a fever but has not been seeing anybody to treat his foot.  He denies wanting to hurt himself or anyone else.  When asked why he has not had this addressed he reports he just did not feel like coming but he cannot please everyone.  He does drink alcohol every day and reports when he does not drink he feels shaky.  He did drink this morning but is starting to feel shaky now.  He also reports a history of eczema and itches all the time.  He does report some pain in his foot.  He has not seen a provider for his foot.  Please officers report the IVC was taken out due to concern that patient was not taking care of himself.  Patient does have a history of depression and anxiety but denies having issues with those right now.  The history is provided by the patient.       Home Medications Prior to Admission medications   Medication Sig Start Date End Date Taking? Authorizing Provider  atorvastatin (LIPITOR) 40 MG tablet Take 1 tablet (40 mg total) by mouth daily. Patient not taking: Reported on 11/19/2020 08/27/20   Russella Dar, NP  folic acid (FOLVITE) 1 MG tablet Take 1 tablet (1 mg total) by mouth daily. Patient not taking:  Reported on 11/19/2020 08/27/20   Russella Dar, NP  melatonin 3 MG TABS tablet Take 1 tablet (3 mg total) by mouth at bedtime. 03/27/21   Marinda Elk, MD  Multiple Vitamin (MULTIVITAMIN WITH MINERALS) TABS tablet Take 1 tablet by mouth daily. Patient not taking: Reported on 03/19/2021 11/27/20   Narda Bonds, MD  triamcinolone cream (KENALOG) 0.1 % Apply 1 Application topically 2 (two) times daily. 01/23/23   Rexford Maus, DO      Allergies    Pork-derived products    Review of Systems   Review of Systems  Physical Exam Updated Vital Signs BP (!) 155/102 (BP Location: Right Arm)   Pulse 99   Temp 98.4 F (36.9 C)   Resp 18  Physical Exam Vitals and nursing note reviewed.  Constitutional:      General: He is not in acute distress.    Appearance: He is well-developed. He is diaphoretic.  HENT:     Head: Normocephalic and atraumatic.  Eyes:     Conjunctiva/sclera: Conjunctivae normal.     Pupils: Pupils are equal, round, and reactive to light.  Cardiovascular:     Rate and Rhythm: Regular rhythm. Tachycardia present.     Heart sounds: No murmur heard. Pulmonary:     Effort: Pulmonary effort is  normal. No respiratory distress.     Breath sounds: Normal breath sounds. No wheezing or rales.  Abdominal:     General: There is no distension.     Palpations: Abdomen is soft.     Tenderness: There is no abdominal tenderness. There is no guarding or rebound.  Musculoskeletal:        General: Tenderness present. Normal range of motion.     Cervical back: Normal range of motion and neck supple.     Comments: Left AKA.  Right foot is malodorous, weeping, erythematous, open wounds with flaking skin that goes from the ankle up to the knee  Skin:    General: Skin is warm.     Findings: Rash present. No erythema.     Comments: Excoriated erythematous papules and patches present over the hands, neck and axillary area  Neurological:     Mental Status: He is alert and  oriented to person, place, and time. Mental status is at baseline.  Psychiatric:        Behavior: Behavior normal.     Comments: Awake and alert.  Mildly agitated due to the situation but is cooperative     ED Results / Procedures / Treatments   Labs (all labs ordered are listed, but only abnormal results are displayed) Labs Reviewed  CBC WITH DIFFERENTIAL/PLATELET - Abnormal; Notable for the following components:      Result Value   Platelets 120 (*)    Lymphs Abs 0.6 (*)    All other components within normal limits  BASIC METABOLIC PANEL WITH GFR - Abnormal; Notable for the following components:   Sodium 127 (*)    Chloride 83 (*)    Glucose, Bld 107 (*)    BUN <5 (*)    Anion gap 20 (*)    All other components within normal limits  HEPATIC FUNCTION PANEL - Abnormal; Notable for the following components:   Total Protein 8.7 (*)    AST 76 (*)    Total Bilirubin 1.8 (*)    Bilirubin, Direct 0.3 (*)    Indirect Bilirubin 1.5 (*)    All other components within normal limits  I-STAT CG4 LACTIC ACID, ED - Abnormal; Notable for the following components:   Lactic Acid, Venous 4.0 (*)    All other components within normal limits  CULTURE, BLOOD (ROUTINE X 2)  CULTURE, BLOOD (ROUTINE X 2)  PROTIME-INR  I-STAT CG4 LACTIC ACID, ED  I-STAT CG4 LACTIC ACID, ED  I-STAT CG4 LACTIC ACID, ED    EKG None  Radiology No results found.  Procedures Procedures    Medications Ordered in ED Medications  lactated ringers bolus 1,000 mL (has no administration in time range)  LORazepam (ATIVAN) injection 2 mg (has no administration in time range)  cefTRIAXone (ROCEPHIN) 2 g in sodium chloride 0.9 % 100 mL IVPB (has no administration in time range)  triamcinolone 0.1 % cream : eucerin cream, 1:1 1 Application (has no administration in time range)  doxycycline (VIBRA-TABS) tablet 100 mg (has no administration in time range)    ED Course/ Medical Decision Making/ A&P                                  Medical Decision Making Amount and/or Complexity of Data Reviewed Independent Historian:     Details: Police External Data Reviewed: notes. Labs: ordered. Decision-making details documented in ED Course. Radiology: ordered and independent interpretation  performed. Decision-making details documented in ED Course. ECG/medicine tests: ordered and independent interpretation performed. Decision-making details documented in ED Course.  Risk Prescription drug management. Decision regarding hospitalization.   Pt with multiple medical problems and comorbidities and presenting today with a complaint that caries a high risk for morbidity and mortality.  Here today with concern for cellulitis from wound infection of the right foot, possible osteomyelitis.  Also concern for early alcohol withdrawal.  Patient had IVC paperwork taken out by his family members due to concern that he was not taking care of himself.  Patient does not seem to be a threat to others and other than not wanting to come to the hospital does not seem to want to hurt himself.  Do not feel that he meets IVC criteria however patient does have signs of wound infection but is hemodynamically stable at this time.  Also has diffuse eczema but only the right foot appears to be infected.  Pulse was palpated and low suspicion for ischemic limb.  Patient covered with antibiotics.  Patient covered with doxycycline but also given Rocephin.  I independently interpreted patient's labs.  CBC is within normal limits except for platelet count of 120 which is chronic, lactate elevated at 4, BMP with hyponatremia of 127, normal renal function and anion gap of 20 and LFTs with mild AST elevation of 76 most likely from patient's chronic alcohol use.  Discussed with patient the severity of his foot and need for treatment and wound care as well as antibiotics.  Initially he was hesitant but is agreeable to stay for antibiotics.  He was also given  Ativan for early alcohol withdrawal.  Consulted unassigned medicine for admission.  I have independently visualized and interpreted pt's images today.  Plain imaging of the right foot and chest without acute findings.  No evidence of bone degradation consistent with osteomyelitis.         Final Clinical Impression(s) / ED Diagnoses Final diagnoses:  Wound infection  Alcohol withdrawal syndrome without complication [facility])    Rx / DC Orders ED Discharge Orders     None         Gwyneth Sprout, MD 07/20/23 1547    Gwyneth Sprout, MD 07/20/23 1550

## 2023-07-20 NOTE — ED Notes (Signed)
 Triage 4

## 2023-07-20 NOTE — H&P (Addendum)
 History and Physical   Jake Nguyen:[id number] DOB: [birth year] DOA: 07/20/2023  PCP: Augustine Radar, FNP   Patient coming from: Home  Chief Complaint: Worsening chronic wounds  HPI: Jake Nguyen is a 58 y.o. male with medical history significant of alcohol use, alcohol withdrawal, left BKA, anxiety, depression presenting with recurrent infection and worsening wounds.  As above patient has history of prior infections including osteomyelitis and bacteremia and is status post left BKA in 2020.  Has had issues with wounds in his right foot for least the past 2 to 3 years.  Had had some wound care in the past but had not had much care in the last year.  Family has become concerned with the worsening side of his his wound and patient not seeking help.  They reportedly took out IVC paperwork on him and patient was escorted to the emergency department by the police.  Patient has not sought further care for his wounds outpatient.  Does have a history of withdrawal with his last drink being this morning, reportedly feeling some shakiness in the ED.  Patient denies fevers, chills, chest pain, shortness of breath, abdominal pain, constipation, diarrhea, nausea, vomiting.  ED Course: Vital signs in the ED notable for blood pressure 150 systolic.  Lab workup included CMP with sodium 127, chloride 83, normal bicarb at gap of 20, protein 8.7, AST stable 76, T. bili 1.8.  CBC with platelets 120.  PT and INR pending.  Initial lactic acid 4.0, repeat pending.  Blood cultures pending.  Chest x-ray and right foot x-ray pending.  Patient received ceftriaxone doxycycline in the ED.  Also received dose of Ativan and 1 L IV fluids.  Review of Systems: As per HPI otherwise all other systems reviewed and are negative.  Past Medical History:  Diagnosis Date   Acute metabolic encephalopathy 11/14/2020   Alcohol abuse with alcohol-induced mood disorder (HCC) 11/10/2019   Alcohol withdrawal syndrome, with  delirium (HCC) 11/06/2019   Alcoholism (HCC)    Anxiety    Depression    Influenza A 05/20/2018   Sepsis (HCC) 11/14/2020   Septic shock (HCC) 11/24/2020    Past Surgical History:  Procedure Laterality Date   ABDOMINAL AORTOGRAM W/LOWER EXTREMITY N/A 08/04/2020   Procedure: ABDOMINAL AORTOGRAM W/LOWER EXTREMITY;  Surgeon: Nada Libman, MD;  Location: [facility] INVASIVE CV LAB;  Service: Cardiovascular;  Laterality: N/A;   AMPUTATION Left 08/05/2020   Procedure: AMPUTATION BELOW KNEE;  Surgeon: Nada Libman, MD;  Location: [facility] OR;  Service: Vascular;  Laterality: Left;   AMPUTATION Left 11/21/2020   Procedure: REVISION LEFT ABOVE KNEE AMPUTATION;  Surgeon: Nada Libman, MD;  Location: [facility] OR;  Service: Vascular;  Laterality: Left;    Social History  reports that he has been smoking cigarettes. He has never used smokeless tobacco. He reports current alcohol use. He reports current drug use. Drug: Marijuana.  Allergies  Allergen Reactions   Pork-Derived Products Other (See Comments)    Pt states he doesn't eat pork    Family History  Family history unknown: Yes   Prior to Admission medications   Medication Sig Start Date End Date Taking? Authorizing Provider  atorvastatin (LIPITOR) 40 MG tablet Take 1 tablet (40 mg total) by mouth daily. Patient not taking: Reported on 11/19/2020 08/27/20   Russella Dar, NP  folic acid (FOLVITE) 1 MG tablet Take 1 tablet (1 mg total) by mouth daily. Patient not taking: Reported on 11/19/2020 08/27/20   Junious Silk  L, NP  Multiple Vitamin (MULTIVITAMIN WITH MINERALS) TABS tablet Take 1 tablet by mouth daily. Patient not taking: Reported on 03/19/2021 11/27/20   Narda Bonds, MD    Physical Exam: Vitals:   07/20/23 1318 07/20/23 1600 07/20/23 1630  BP: (!) 155/102 (!) 149/98 (!) 120/109  Pulse: 99 93 71  Resp: 18 (!) 23 19  Temp: 98.4 F (36.9 C)    SpO2:  99% (!) 80%    Physical Exam Constitutional:      General: He is not in  acute distress.    Appearance: Normal appearance.  HENT:     Head: Normocephalic and atraumatic.     Mouth/Throat:     Mouth: Mucous membranes are moist.     Pharynx: Oropharynx is clear.  Eyes:     Extraocular Movements: Extraocular movements intact.     Pupils: Pupils are equal, round, and reactive to light.  Cardiovascular:     Rate and Rhythm: Regular rhythm. Tachycardia present.     Pulses: Normal pulses.     Heart sounds: Normal heart sounds.  Pulmonary:     Effort: Pulmonary effort is normal. No respiratory distress.     Breath sounds: Normal breath sounds.  Abdominal:     General: Bowel sounds are normal. There is no distension.     Palpations: Abdomen is soft.     Tenderness: There is no abdominal tenderness.  Musculoskeletal:     Comments: Right lower extremity wounds with surrounding erythema. Some skin sloughing. S/P L BKA.  Skin:    General: Skin is warm and dry.     Findings: Rash present.  Neurological:     General: No focal deficit present.     Mental Status: Mental status is at baseline.    Labs on Admission: I have personally reviewed following labs and imaging studies  CBC: Recent Labs  Lab 07/20/23 1353  WBC 9.0  NEUTROABS 7.7  HGB 15.7  HCT 45.7  MCV 86.1  PLT 120*    Basic Metabolic Panel: Recent Labs  Lab 07/20/23 1353  NA 127*  K 3.5  CL 83*  CO2 24  GLUCOSE 107*  BUN <5*  CREATININE 0.77  CALCIUM 9.0    GFR: CrCl cannot be calculated (Unknown ideal weight.).  Liver Function Tests: Recent Labs  Lab 07/20/23 1412  AST 76*  ALT 38  ALKPHOS 56  BILITOT 1.8*  PROT 8.7*  ALBUMIN 3.8    Urine analysis:    Component Value Date/Time   COLORURINE YELLOW 03/19/2021 0101   APPEARANCEUR CLEAR 03/19/2021 0101   LABSPEC 1.012 03/19/2021 0101   PHURINE 5.0 03/19/2021 0101   GLUCOSEU 50 (A) 03/19/2021 0101   HGBUR NEGATIVE 03/19/2021 0101   BILIRUBINUR NEGATIVE 03/19/2021 0101   KETONESUR 5 (A) 03/19/2021 0101   PROTEINUR  NEGATIVE 03/19/2021 0101   UROBILINOGEN 0.2 08/02/2009 2144   NITRITE NEGATIVE 03/19/2021 0101   LEUKOCYTESUR NEGATIVE 03/19/2021 0101    Radiological Exams on Admission: No results found.  EKG: Not yet performed in the ED Assessment/Plan Principal Problem:   Wound infection Active Problems:   Alcohol use   S/P BKA (below knee amputation) unilateral, left (HCC)   History of alcohol withdrawal syndrome   Wound infection > Chronic right lower extremity wounds now with worsening and evidence of superimposed infection. (In history of prior infections, status post left BKA.) > Have been present for at least a couple years and had not received some care for them previously but has  not received care in the last year. > Brought by family due to concern for him not taking care of himself.  Was actually escorted by police and family reportedly have taken out IVC paperwork on him, patient does not meet IVC paperwork in the ED and EDP and I have been unable to see the paperwork that family reportedly have. > Received ceftriaxone and doxycycline in the ED.  Also received a liter of fluids.  Lactic acid 4.0 with repeat pending.  Imaging pending. - Monitor on telemetry overnight - Switch antibiotics to Unasyn and linezolid - Trend fever curve and WBC - Follow-up blood cultures - Follow-up chest x-ray and left foot x-ray - Trend lactic acid - Supportive care  Alcohol use History of alcohol withdrawal > Continue to drink, last drink was this morning, started to feel little shaky.  Received Ativan in the ED. - Telemetry - Thiamine, folate, multivitamin - Scheduled Librium taper - CIWA with Ativan  Hyponatremia > 127, previous baseline around 130.  Likely related to his diet/alcohol use. - Monitor response to fluids - Trend  Anxiety Depression - Not currently on any medications for this. May be contributing to his refusal to seek treatment.  DVT prophylaxis: Lovenox, refuses pork derived  products Code Status:   Full Family Communication:  Updated by EDP, Patient now confirmed IVC until he can be cleared (but is agreeable). Not present during my eval.  Disposition Plan:   Patient is from:  Home  Anticipated DC to:  Home  Anticipated DC date:  1 to 3 days  Anticipated DC barriers: None  Consults called:  None Admission status:  Sedation, telemetry  Severity of Illness: The appropriate patient status for this patient is OBSERVATION. Observation status is judged to be reasonable and necessary in order to provide the required intensity of service to ensure the patient's safety. The patient's presenting symptoms, physical exam findings, and initial radiographic and laboratory data in the context of their medical condition is felt to place them at decreased risk for further clinical deterioration. Furthermore, it is anticipated that the patient will be medically stable for discharge from the hospital within 2 midnights of admission.    Synetta Fail MD [facility]  How to contact the [facility] Attending or Consulting provider 7A - 7P or covering provider during after hours 7P -7A, for this patient?   Check the care team in [facility] and look for a) attending/consulting TR[facility] provider listed and b) the [facility] team listed Log into [email or web address] and use [facility]'s universal password to access. If you do not have the password, please contact the hospital operator. Locate the [facility] provider you are looking for under [facility] and page to a number that you can be directly reached. If you still have difficulty reaching the provider, please page the [facility] (Director on Call) for the Hospitalists listed on amion for assistance.  07/20/2023, 4:44 PM

## 2023-07-20 NOTE — Progress Notes (Addendum)
 Pharmacy Antibiotic Note  Jake Nguyen is a 58 y.o. male admitted on 07/20/2023 with  wound infection .  PMH significant for osteomyelitis with bacteremia due to fusiform bacterium and left above the knee amputation. Pharmacy has been consulted for Unasyn dosing. Patient received doxycycline and ceftriaxone in the ED.   4/10: WBC 9.0, lactate 4.0, Scr 0.77, afebrile  Plan: Unasyn 3 gm IV every 6 hours Linezolid per MD Monitor renal function and culture results F/u LOT     Temp (24hrs), Avg:98.4 F (36.9 C), Min:98.4 F (36.9 C), Max:98.4 F (36.9 C)  Recent Labs  Lab 07/20/23 1353 07/20/23 1405  WBC 9.0  --   CREATININE 0.77  --   LATICACIDVEN  --  4.0*    CrCl cannot be calculated (Unknown ideal weight.).    Allergies  Allergen Reactions   Pork-Derived Products Other (See Comments)    Pt states he doesn't eat pork    Antimicrobials this admission: Doxycyline 4/10 Ceftriaxone 4/10  Unasyn 4/10 >> Linezolid  Microbiology results: 4/10 Bcx:   Thank you for allowing pharmacy to be a part of this patient's care.  Enos Fling, PharmD PGY-1 Acute Care Pharmacy Resident 07/20/2023 4:25 PM

## 2023-07-21 ENCOUNTER — Encounter (HOSPITAL_COMMUNITY): Payer: Self-pay | Admitting: Internal Medicine

## 2023-07-21 DIAGNOSIS — F32A Depression, unspecified: Secondary | ICD-10-CM | POA: Diagnosis present

## 2023-07-21 DIAGNOSIS — Z89612 Acquired absence of left leg above knee: Secondary | ICD-10-CM | POA: Diagnosis not present

## 2023-07-21 DIAGNOSIS — L089 Local infection of the skin and subcutaneous tissue, unspecified: Secondary | ICD-10-CM | POA: Diagnosis present

## 2023-07-21 DIAGNOSIS — Z5901 Sheltered homelessness: Secondary | ICD-10-CM | POA: Diagnosis not present

## 2023-07-21 DIAGNOSIS — I739 Peripheral vascular disease, unspecified: Secondary | ICD-10-CM | POA: Diagnosis present

## 2023-07-21 DIAGNOSIS — F1020 Alcohol dependence, uncomplicated: Secondary | ICD-10-CM | POA: Diagnosis present

## 2023-07-21 DIAGNOSIS — Z91014 Allergy to mammalian meats: Secondary | ICD-10-CM | POA: Diagnosis not present

## 2023-07-21 DIAGNOSIS — Z79899 Other long term (current) drug therapy: Secondary | ICD-10-CM | POA: Diagnosis not present

## 2023-07-21 DIAGNOSIS — F29 Unspecified psychosis not due to a substance or known physiological condition: Secondary | ICD-10-CM | POA: Diagnosis present

## 2023-07-21 DIAGNOSIS — T148XXA Other injury of unspecified body region, initial encounter: Secondary | ICD-10-CM | POA: Diagnosis not present

## 2023-07-21 DIAGNOSIS — F419 Anxiety disorder, unspecified: Secondary | ICD-10-CM | POA: Diagnosis present

## 2023-07-21 DIAGNOSIS — L309 Dermatitis, unspecified: Secondary | ICD-10-CM | POA: Diagnosis present

## 2023-07-21 DIAGNOSIS — Z8619 Personal history of other infectious and parasitic diseases: Secondary | ICD-10-CM | POA: Diagnosis not present

## 2023-07-21 DIAGNOSIS — F17210 Nicotine dependence, cigarettes, uncomplicated: Secondary | ICD-10-CM | POA: Diagnosis present

## 2023-07-21 LAB — COMPREHENSIVE METABOLIC PANEL WITH GFR
ALT: 27 U/L (ref 0–44)
AST: 45 U/L — ABNORMAL HIGH (ref 15–41)
Albumin: 3.2 g/dL — ABNORMAL LOW (ref 3.5–5.0)
Alkaline Phosphatase: 43 U/L (ref 38–126)
Anion gap: 15 (ref 5–15)
BUN: <5 mg/dL — ABNORMAL LOW (ref 6–20)
CO2: 27 mmol/L (ref 22–32)
Calcium: 8.5 mg/dL — ABNORMAL LOW (ref 8.9–10.3)
Chloride: 87 mmol/L — ABNORMAL LOW (ref 98–111)
Creatinine, Ser: 0.82 mg/dL (ref 0.61–1.24)
GFR, Estimated: >60 mL/min (ref >60)
Glucose, Bld: 105 mg/dL — ABNORMAL HIGH (ref 70–99)
Potassium: 3.1 mmol/L — ABNORMAL LOW (ref 3.5–5.1)
Sodium: 129 mmol/L — ABNORMAL LOW (ref 135–145)
Total Bilirubin: 1.3 mg/dL — ABNORMAL HIGH (ref 0.0–1.2)
Total Protein: 6.8 g/dL (ref 6.5–8.1)

## 2023-07-21 LAB — CBC
HCT: 37.4 % — ABNORMAL LOW (ref 39.0–52.0)
Hemoglobin: 13.4 g/dL (ref 13.0–17.0)
MCH: 30.3 pg (ref 26.0–34.0)
MCHC: 35.8 g/dL (ref 30.0–36.0)
MCV: 84.6 fL (ref 80.0–100.0)
Platelets: 93 10*3/uL — ABNORMAL LOW (ref 150–400)
RBC: 4.42 MIL/uL (ref 4.22–5.81)
RDW: 14.2 % (ref 11.5–15.5)
WBC: 8 10*3/uL (ref 4.0–10.5)
nRBC: 0 % (ref 0.0–0.2)

## 2023-07-21 LAB — GLUCOSE, CAPILLARY: Glucose-Capillary: 113 mg/dL — ABNORMAL HIGH (ref 70–99)

## 2023-07-21 LAB — MAGNESIUM: Magnesium: 1.9 mg/dL (ref 1.7–2.4)

## 2023-07-21 MED ORDER — POTASSIUM CHLORIDE CRYS ER 20 MEQ PO TBCR
40.0000 meq | EXTENDED_RELEASE_TABLET | ORAL | Status: AC
  Administered 2023-07-21 (×2): 40 meq via ORAL
  Filled 2023-07-21 (×2): qty 2

## 2023-07-21 MED ORDER — CLOTRIMAZOLE-BETAMETHASONE 1-0.05 % EX CREA
TOPICAL_CREAM | Freq: Two times a day (BID) | CUTANEOUS | Status: DC
  Filled 2023-07-21: qty 15

## 2023-07-21 MED ORDER — FONDAPARINUX SODIUM 2.5 MG/0.5ML ~~LOC~~ SOLN
2.5000 mg | SUBCUTANEOUS | Status: DC
  Administered 2023-07-21: 2.5 mg via SUBCUTANEOUS
  Filled 2023-07-21 (×2): qty 0.5

## 2023-07-21 MED ORDER — LINEZOLID 600 MG PO TABS
600.0000 mg | ORAL_TABLET | Freq: Two times a day (BID) | ORAL | Status: DC
  Administered 2023-07-21 – 2023-07-22 (×2): 600 mg via ORAL
  Filled 2023-07-21 (×3): qty 1

## 2023-07-21 NOTE — Plan of Care (Signed)

## 2023-07-21 NOTE — Progress Notes (Signed)
 PROGRESS NOTE    Jake PEPLINSKI  Nguyen:[id number] DOB: [birth year] DOA: 07/20/2023 PCP: Augustine Radar, FNP  Outpatient Specialists:     Brief Narrative:  As per H&P done on presentation: "Jake Nguyen is a 58 y.o. male with medical history significant of alcohol use, alcohol withdrawal, left BKA, anxiety, depression presenting with recurrent infection and worsening wounds.   As above patient has history of prior infections including osteomyelitis and bacteremia and is status post left BKA in 2020.  Has had issues with wounds in his right foot for least the past 2 to 3 years.  Had had some wound care in the past but had not had much care in the last year.  Family has become concerned with the worsening side of his his wound and patient not seeking help.  They reportedly took out IVC paperwork on him and patient was escorted to the emergency department by the police.   Patient has not sought further care for his wounds outpatient.  Does have a history of withdrawal with his last drink being this morning, reportedly feeling some shakiness in the ED.   Patient denies fevers, chills, chest pain, shortness of breath, abdominal pain, constipation, diarrhea, nausea, vomiting.   ED Course: Vital signs in the ED notable for blood pressure 150 systolic.  Lab workup included CMP with sodium 127, chloride 83, normal bicarb at gap of 20, protein 8.7, AST stable 76, T. bili 1.8.  CBC with platelets 120.  PT and INR pending.  Initial lactic acid 4.0, repeat pending.  Blood cultures pending.  Chest x-ray and right foot x-ray pending.  Patient received ceftriaxone doxycycline in the ED.  Also received dose of Ativan and 1 L IV fluids".  07/21/2023: Patient seen.  Foul-smelling of the right foot wound (chronic) persists.  Patient is currently on IV Unasyn and oral Zyvox.  Blood cultures pending.  No wound culture visualized.  Will order wound culture.  Consulted orthopedic team, Dr. Lajoyce Corners.  Orthopedic team  will see patient in consultation.  Lab work revealed potassium of 3.1.  Patient has received total of 80 mEq of potassium today.  Repeat renal function and magnesium in the morning.  Patient may be homeless.   Assessment & Plan:   Principal Problem:   Wound infection Active Problems:   Alcohol use   S/P BKA (below knee amputation) unilateral, left (HCC)   History of alcohol withdrawal syndrome   Right foot infection   Wound infection > Chronic right lower extremity wounds now with worsening and evidence of superimposed infection. (In history of prior infections, status post left BKA.) > Have been present for at least a couple years and had not received some care for them previously but has not received care in the last year. > Brought by family due to concern for him not taking care of himself.  Was actually escorted by police and family reportedly have taken out IVC paperwork on him, patient does not meet IVC paperwork in the ED and EDP and I have been unable to see the paperwork that family reportedly have. > Received ceftriaxone and doxycycline in the ED.  Also received a liter of fluids.  Lactic acid 4.0 with repeat pending.  Imaging pending. - Monitor on telemetry overnight - Switch antibiotics to Unasyn and linezolid - Trend fever curve and WBC - Follow-up blood cultures - Follow-up chest x-ray and left foot x-ray - Trend lactic acid - Supportive care 07/21/2023: See above documentation.  Orthopedic team consulted.  Wound culture sent.   Alcohol use History of alcohol withdrawal > Continue to drink, last drink was this morning, started to feel little shaky.  Received Ativan in the ED. - Telemetry - Thiamine, folate, multivitamin - Scheduled Librium taper - CIWA with Ativan   Hyponatremia > 127, previous baseline around 130.  Likely related to his diet/alcohol use. - Monitor response to fluids - Trend  Hypokalemia: -Potassium of 3.1. -Continue to monitor and replace.    Anxiety Depression - Not currently on any medications for this. May be contributing to his refusal to seek treatment.     DVT prophylaxis: Subcutaneous Arixtra 2.5 Mg daily. Code Status: Full code. Family Communication:  Disposition Plan: Patient remains inpatient.  Patient may be homeless.   Consultants:  Orthopedic team.  Procedures:  None.  Antimicrobials:  IV Unasyn. Oral Zyvox   Subjective: Foul-smelling right foot wound Rash both hands, mainly at the fingers  Objective: Vitals:   07/21/23 1150 07/21/23 1202 07/21/23 1455 07/21/23 1456  BP:  116/73 113/77 113/77  Pulse:  93 98 98  Resp: 18 20 18 20   Temp:  98.5 F (36.9 C) 98.4 F (36.9 C) 98.4 F (36.9 C)  TempSrc:  Oral Oral Oral  SpO2:  93% 96% 96%    Intake/Output Summary (Last 24 hours) at 07/21/2023 1931 Last data filed at 07/21/2023 0000 Gross per 24 hour  Intake --  Output 450 ml  Net -450 ml   There were no vitals filed for this visit.  Examination:  General exam: Not in any distress.  Awake and alert.  Foul-smelling right foot wound, wrapped.   Respiratory system: Clear to auscultation.  Cardiovascular system: S1 & S2 heard Gastrointestinal system: Abdomen is soft and nontender  Central nervous system: Awake and alert.   Extremities: Left above-knee amputation.  Foul-smelling right foot wound, wrapped.  Data Reviewed: I have personally reviewed following labs and imaging studies  CBC: Recent Labs  Lab 07/20/23 1353 07/21/23 0421  WBC 9.0 8.0  NEUTROABS 7.7  --   HGB 15.7 13.4  HCT 45.7 37.4*  MCV 86.1 84.6  PLT 120* 93*   Basic Metabolic Panel: Recent Labs  Lab 07/20/23 1353 07/20/23 2033 07/21/23 0421  NA 127* 127* 129*  K 3.5 3.6 3.1*  CL 83* 83* 87*  CO2 24 27 27   GLUCOSE 107* 113* 105*  BUN <5* 5* <5*  CREATININE 0.77 0.89 0.82  CALCIUM 9.0 8.8* 8.5*  MG  --   --  1.9   GFR: CrCl cannot be calculated (Unknown ideal weight.). Liver Function Tests: Recent  Labs  Lab 07/20/23 1412 07/21/23 0421  AST 76* 45*  ALT 38 27  ALKPHOS 56 43  BILITOT 1.8* 1.3*  PROT 8.7* 6.8  ALBUMIN 3.8 3.2*   No results for input(s): "LIPASE", "AMYLASE" in the last 168 hours. No results for input(s): "AMMONIA" in the last 168 hours. Coagulation Profile: Recent Labs  Lab 07/20/23 2033  INR 1.0   Cardiac Enzymes: No results for input(s): "CKTOTAL", "CKMB", "CKMBINDEX", "TROPONINI" in the last 168 hours. BNP (last 3 results) No results for input(s): "PROBNP" in the last 8760 hours. HbA1C: Recent Labs    07/20/23 2033  HGBA1C 5.6   CBG: Recent Labs  Lab 07/20/23 1700 07/20/23 2144  GLUCAP 113* 124*   Lipid Profile: No results for input(s): "CHOL", "HDL", "LDLCALC", "TRIG", "CHOLHDL", "LDLDIRECT" in the last 72 hours. Thyroid Function Tests: No results for input(s): "TSH", "T4TOTAL", "FREET4", "T3FREE", "THYROIDAB" in the  last 72 hours. Anemia Panel: No results for input(s): "VITAMINB12", "FOLATE", "FERRITIN", "TIBC", "IRON", "RETICCTPCT" in the last 72 hours. Urine analysis:    Component Value Date/Time   COLORURINE YELLOW 03/19/2021 0101   APPEARANCEUR CLEAR 03/19/2021 0101   LABSPEC 1.012 03/19/2021 0101   PHURINE 5.0 03/19/2021 0101   GLUCOSEU 50 (A) 03/19/2021 0101   HGBUR NEGATIVE 03/19/2021 0101   BILIRUBINUR NEGATIVE 03/19/2021 0101   KETONESUR 5 (A) 03/19/2021 0101   PROTEINUR NEGATIVE 03/19/2021 0101   UROBILINOGEN 0.2 08/02/2009 2144   NITRITE NEGATIVE 03/19/2021 0101   LEUKOCYTESUR NEGATIVE 03/19/2021 0101   Sepsis Labs: @LABRCNTIP (procalcitonin:4,lacticidven:4)  ) Recent Results (from the past 240 hours)  Blood Culture (routine x 2)     Status: None (Preliminary result)   Collection Time: 07/20/23  2:12 PM   Specimen: BLOOD LEFT FOREARM  Result Value Ref Range Status   Specimen Description BLOOD LEFT FOREARM  Final   Special Requests   Final    BOTTLES DRAWN AEROBIC AND ANAEROBIC Blood Culture results may not be  optimal due to an inadequate volume of blood received in culture bottles   Culture   Final    NO GROWTH < 24 HOURS Performed at [facility], [address] [place], [place] [address]    Report Status PENDING  Incomplete  Blood Culture (routine x 2)     Status: None (Preliminary result)   Collection Time: 07/20/23  2:17 PM   Specimen: BLOOD RIGHT HAND  Result Value Ref Range Status   Specimen Description BLOOD RIGHT HAND  Final   Special Requests AEROBIC BOTTLE ONLY Blood Culture adequate volume  Final   Culture   Final    NO GROWTH < 24 HOURS Performed at [facility], [address] [place], [place] [address]    Report Status PENDING  Incomplete         Radiology Studies: DG Chest Port 1 View Result Date: 07/20/2023 CLINICAL DATA:  Possible sepsis. EXAM: PORTABLE CHEST 1 VIEW COMPARISON:  March 19, 2021. FINDINGS: The heart size and mediastinal contours are within normal limits. Both lungs are clear. The visualized skeletal structures are unremarkable. IMPRESSION: No active disease. Electronically Signed   By: Lupita Raider M.D.   On: 07/20/2023 17:07   DG Foot Complete Right Result Date: 07/20/2023 CLINICAL DATA:  Right foot infection. EXAM: RIGHT FOOT COMPLETE - 3+ VIEW COMPARISON:  March 19, 2021. FINDINGS: There is no evidence of fracture or dislocation. There is no evidence of arthropathy or other focal bone abnormality. Soft tissues are unremarkable. IMPRESSION: Negative. Electronically Signed   By: Lupita Raider M.D.   On: 07/20/2023 17:07        Scheduled Meds:  chlordiazePOXIDE  25 mg Oral QID   Followed by   Melene Muller ON 07/22/2023] chlordiazePOXIDE  25 mg Oral TID   Followed by   Melene Muller ON 07/23/2023] chlordiazePOXIDE  25 mg Oral BH-qamhs   Followed by   Melene Muller ON 07/24/2023] chlordiazePOXIDE  25 mg Oral Daily   folic acid  1 mg Oral Daily   linezolid  600 mg Oral Q12H   multivitamin with minerals  1 tablet Oral Daily   sodium chloride flush   3 mL Intravenous Q12H   thiamine  100 mg Oral Daily   triamcinolone 0.1 % cream : eucerin  1 Application Topical BID   Continuous Infusions:  ampicillin-sulbactam (UNASYN) IV 3 g (07/21/23 1714)     LOS: 0 days  Time spent: 35 minutes.    Berton Mount, MD  [facility] Pager #: [phone] 7PM-7AM contact night coverage as above

## 2023-07-21 NOTE — Consult Note (Signed)
 Spoke with the primary team concerning the need for capacity test. The primary team confirmed that patient has capacity and is not on IVC. Psychiatry will sign off at this time and you may re consult for further needs.

## 2023-07-21 NOTE — Consult Note (Signed)
 WOC Nurse Consult Note: patient with history of L AKA Reason for Consult: R lower extremity/foot wounds  Wound type: full thickness r/t PVD  Pressure Injury POA: NA, not related to pressure  Measurement: see nursing flowsheet  Wound bed: dark peeling skin to plantar aspect of foot, dorsal foot pink moist with some scattered yellow tissue  Drainage (amount, consistency, odor) malodorous per MD note  Periwound: erythema, edema  Dressing procedure/placement/frequency:  Cleanse R foot/lower extremity wounds with Vashe wound cleanser [place] [phone]), do not rinse and allow to air dry.  Using Vashe moistened gauze cleanse in between toes and allow to air dry.  Cut a strip of Silver hydrofiber [place] [phone]) and weave in between toes. Cover dorsal and plantar aspect of foot  with Xeroform gauze [place] [phone]), cover with dry gauze or ABD pad and wrap with Kerlix roll gauze.  May secure entire dressing with Ace bandage.    POC discussed with bedside nurse. WOC team will not follow. Re-consult if further needs arise.   Thank you,    Priscella Mann MSN, RN-BC, [employer] [phone]

## 2023-07-22 DIAGNOSIS — L089 Local infection of the skin and subcutaneous tissue, unspecified: Secondary | ICD-10-CM | POA: Diagnosis not present

## 2023-07-22 DIAGNOSIS — T148XXA Other injury of unspecified body region, initial encounter: Secondary | ICD-10-CM | POA: Diagnosis not present

## 2023-07-22 LAB — CBC WITH DIFFERENTIAL/PLATELET
Abs Immature Granulocytes: 0.02 10*3/uL (ref 0.00–0.07)
Basophils Absolute: 0 10*3/uL (ref 0.0–0.1)
Basophils Relative: 1 %
Eosinophils Absolute: 0.2 10*3/uL (ref 0.0–0.5)
Eosinophils Relative: 4 %
HCT: 38 % — ABNORMAL LOW (ref 39.0–52.0)
Hemoglobin: 12.8 g/dL — ABNORMAL LOW (ref 13.0–17.0)
Immature Granulocytes: 0 %
Lymphocytes Relative: 27 %
Lymphs Abs: 1.4 10*3/uL (ref 0.7–4.0)
MCH: 29.5 pg (ref 26.0–34.0)
MCHC: 33.7 g/dL (ref 30.0–36.0)
MCV: 87.6 fL (ref 80.0–100.0)
Monocytes Absolute: 0.5 10*3/uL (ref 0.1–1.0)
Monocytes Relative: 9 %
Neutro Abs: 3 10*3/uL (ref 1.7–7.7)
Neutrophils Relative %: 59 %
Platelets: 88 10*3/uL — ABNORMAL LOW (ref 150–400)
RBC: 4.34 MIL/uL (ref 4.22–5.81)
RDW: 14.2 % (ref 11.5–15.5)
WBC: 5.1 10*3/uL (ref 4.0–10.5)
nRBC: 0 % (ref 0.0–0.2)

## 2023-07-22 LAB — RENAL FUNCTION PANEL
Albumin: 3 g/dL — ABNORMAL LOW (ref 3.5–5.0)
Anion gap: 11 (ref 5–15)
BUN: <5 mg/dL — ABNORMAL LOW (ref 6–20)
CO2: 25 mmol/L (ref 22–32)
Calcium: 8.3 mg/dL — ABNORMAL LOW (ref 8.9–10.3)
Chloride: 96 mmol/L — ABNORMAL LOW (ref 98–111)
Creatinine, Ser: 0.64 mg/dL (ref 0.61–1.24)
GFR, Estimated: >60 mL/min (ref >60)
Glucose, Bld: 117 mg/dL — ABNORMAL HIGH (ref 70–99)
Phosphorus: 3.2 mg/dL (ref 2.5–4.6)
Potassium: 3.5 mmol/L (ref 3.5–5.1)
Sodium: 132 mmol/L — ABNORMAL LOW (ref 135–145)

## 2023-07-22 LAB — MAGNESIUM: Magnesium: 2 mg/dL (ref 1.7–2.4)

## 2023-07-22 MED ORDER — NICOTINE 14 MG/24HR TD PT24
14.0000 mg | MEDICATED_PATCH | Freq: Once | TRANSDERMAL | Status: DC
  Administered 2023-07-22: 14 mg via TRANSDERMAL
  Filled 2023-07-22: qty 1

## 2023-07-22 NOTE — Consult Note (Signed)
 [facility] Psychiatric Consult Initial  Patient Name: .Jake Nguyen  MRN: [id number]  DOB: [birth year]  Consult Order details:  Orders (From admission, onward)     Start     Ordered   07/22/23 0952  IP CONSULT TO PSYCHIATRY       Ordering Provider: Barnetta Chapel, MD  Provider:  (Not yet assigned)  Question Answer Comment  Location [facility]   Reason for Consult? Nursing staff worried about possible behavioral problems, rule out underlying psychosis/Psych illness. Confusion re IVC      07/22/23 1478   07/21/23 1017  IP CONSULT TO PSYCHIATRY       Ordering Provider: Barnetta Chapel, MD  Provider:  (Not yet assigned)  Question Answer Comment  Location [facility]   Reason for Consult? IVC. Capacity assessment      07/21/23 1017             Mode of Visit: In person    Psychiatry Consult Evaluation  Service Date: July 22, 2023 LOS:  LOS: 1 day  Chief Complaint "I'm ready to go"  Primary Psychiatric Diagnoses  Alcohol Use D/O # Homelessness   Assessment  Jake Nguyen is a 58 y.o. male admitted: Medically on  07/20/2023  1:13 PM for worsening chronic wounds. Family with concerns of worsening wound on R LE and pt not seeking help. PMH includes: L AKA, He carries the psychiatric diagnoses of  anxiety, alcohol use, depression.    Patient  was brought to [facility] ED on a pettitioned for IVC on 07/20/23 for not eating, sleeping or caring for self. Drinking 40 oz of beer daily and not tending to his health. Patient was admitted and treated medically.   Patient admits drinking 40 oz of beer almost everyday and denies use of other illicit drugs. Denies having any withdrawal symptoms.  Not currently on any outpatient psychotropic medication. On initial examination, patient presents calm, pleasant and cooperative with interview. Conversation was linear, coherent and goal directed. He voiced the need to take care of his wound and he  plans to follow up with the outpatient clinic besides the shelter he would like to be discharged to. Patient state is currently amenable to alcohol treatment but agreed to decrease his alcohol consumption. He demonstrated safe behaviours and was not found to be in any acute psychiatric distress. He denied SI/HI/AVH, no paranoia or delusions voiced. Patient does not necessitate nor desire further acute inpatient psychiatric care at this time.   Please see plan below for detailed recommendations.   Diagnoses:  Active Hospital problems: Principal Problem:   Wound infection Active Problems:   Alcohol use   S/P BKA (below knee amputation) unilateral, left (HCC)   History of alcohol withdrawal syndrome   Right foot infection    Plan   ## Psychiatric Medication Recommendations:  -- Complete FEI and rescind IVC  -- No medication recommendation at this time -- Patient encouraged to abstain from alcohol use -- Please provide patient with outpatient mental health services and substance use treatment resources in the community.   ## Medical Decision Making Capacity:  Intact  ## Further Work-up:   UDS- Not collected, NO BAL -- most recent EKG on 03/19/21 had QtC of 517 -- Pertinent labwork reviewed earlier this admission includes: CBC- , BMP   ## Disposition:-- There are no psychiatric contraindications to discharge at this time  ## Behavioral / Environmental: - No specific recommendations at this time.     ##  Safety and Observation Level:  - Based on my clinical evaluation, I estimate the patient to be at low risk of self harm in the current setting. - At this time, we recommend  routine. This decision is based on my review of the chart including patient's history and current presentation, interview of the patient, mental status examination, and consideration of suicide risk including evaluating suicidal ideation, plan, intent, suicidal or self-harm behaviors, risk factors, and protective  factors. This judgment is based on our ability to directly address suicide risk, implement suicide prevention strategies, and develop a safety plan while the patient is in the clinical setting. Please contact our team if there is a concern that risk level has changed.  CSSR Risk Category:C-SSRS RISK CATEGORY: No Risk  Suicide Risk Assessment: Patient has following modifiable risk factors for suicide: chronic alcohol use and homelessness, which we are addressing by Offering community resources. Patient has following non-modifiable or demographic risk factors for suicide: male gender Patient has the following protective factors against suicide: Supportive family and Supportive friends  Thank you for this consult request. Recommendations have been communicated to the primary team.  We will sign off  at this time.   Garnett Justice, NP       History of Present Illness  Relevant Aspects of [facility] Course:  Admitted on 07/20/2023 for worsening chronic wounds. Family with concerns of worsening wound on R LE and pt not seeking help. PMH includes: L AKA, anxiety, alcohol use, depression.  .   Patient Report:  Patient met in his room, sitting up in chair and eating lunch. Sitter was present in the room. Patient presents calm and cooperative with assessment. He adamantly denies needing any psychiatric help at this time. Denies SI/HI/AVH, plan or intent to harm . States that he plans to return to the [facility] where he previously resided. He mentions that his brother has his car and his poor choice had contributed to making them both homeless. States that his brother refused to pay the rent when he went away for his amputation surgery and they have been homeless since then. He admits drinking about 40 oz of beer almost everyday, denies withdrawal symptoms. Denies using any illicit drugs.   Psych ROS:  Depression: Patient denies Anxiety:  Patient denies Mania (lifetime and current):  Patient denies Psychosis: (lifetime and current): patient denies and non noted on assessment  Collateral information:  Contacted Sister Levi Real at [phone]. Ms Letha Rav stated that she has spoken with patient since he arrived here. She voiced no safety concern at this time.   Review of Systems  Psychiatric/Behavioral:  Positive for memory loss. Negative for depression, hallucinations and suicidal ideas. The patient is not nervous/anxious and does not have insomnia.      Psychiatric and Social History  Psychiatric History:  Information collected from patient, chart review, staff and IVC.  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 Living Situation: Homeless, lives in the shelter Spiritual Hx: "I believe in God" Access to weapons/lethal means: Denies   Substance History Alcohol:Beer almost daily   Type of alcohol -Beer Last Drink 2 days ago Number of drinks per  day -40 oz History of alcohol withdrawal seizures-Denies History of DT's - Yes Tobacco:cigarette 1/3 a day Illicit drugs: Hx of cannabinoid, not currently  Prescription drug abuse: denies Rehab hx: Alcohol use  Exam Findings  Physical Exam:  Vital Signs:  Temp:  [97.9 F (36.6 C)-98.4 F (36.9 C)] 97.9 F (36.6 C) (04/12 0400) Pulse Rate:  [67-98] 70 (04/12 0600) Resp:  [18-20] 20 (04/11 1456) BP: (113-125)/(72-80) 125/72 (04/12 0600) SpO2:  [93 %-96 %] 93 % (04/12 0400) Blood pressure 125/72, pulse 70, temperature 97.9 F (36.6 C), temperature source Oral, resp. rate 20, SpO2 93%. There is no height or  weight on file to calculate BMI.  Physical Exam  Mental Status Exam: General Appearance: Casual and Disheveled  Orientation:  Full (Time, Place, and Person)  Memory:  Immediate;   Good  Concentration:  Concentration: Good  Recall:  Good  Attention  Good  Eye Contact:  Good  Speech:  Normal Rate  Language:  Good  Volume:  Normal  Mood: "I'm great"  Affect:  Congruent  Thought Process:  Coherent, Goal Directed, and Linear  Thought Content:  Logical  Suicidal Thoughts:  No  Homicidal Thoughts:  No  Judgement:  Good  Insight:  Shallow  Psychomotor Activity:  Normal  Akathisia:  No  Fund of Knowledge:  Fair      Assets:  Manufacturing systems engineer Resilience  Cognition:  WNL  ADL's:  Impaired  AIMS (if indicated):        Other History   These have been pulled in through the EMR, reviewed, and updated if appropriate.  Family History:  The patient's Family history is unknown by patient.  Medical History: Past Medical History:  Diagnosis Date   Acute metabolic encephalopathy 11/14/2020   Alcohol abuse with alcohol-induced mood disorder (HCC) 11/10/2019   Alcohol withdrawal syndrome, with delirium (HCC) 11/06/2019   Alcoholism (HCC)    Anxiety    Depression    Influenza A 05/20/2018   Sepsis (HCC) 11/14/2020   Septic shock (HCC) 11/24/2020    Surgical History: Past Surgical History:  Procedure Laterality Date   ABDOMINAL AORTOGRAM W/LOWER EXTREMITY N/A 08/04/2020   Procedure: ABDOMINAL AORTOGRAM W/LOWER EXTREMITY;  Surgeon: Margherita Shell, MD;  Location: [facility] INVASIVE CV LAB;  Service: Cardiovascular;  Laterality: N/A;   AMPUTATION Left 08/05/2020   Procedure: AMPUTATION BELOW KNEE;  Surgeon: Margherita Shell, MD;  Location: [facility] OR;  Service: Vascular;  Laterality: Left;   AMPUTATION Left 11/21/2020   Procedure: REVISION LEFT ABOVE KNEE AMPUTATION;  Surgeon: Margherita Shell, MD;  Location: [facility] OR;  Service: Vascular;  Laterality: Left;     Medications:   Current  Facility-Administered Medications:    acetaminophen (TYLENOL) tablet 650 mg, 650 mg, Oral, Q6H PRN **OR** acetaminophen (TYLENOL) suppository 650 mg, 650 mg, Rectal, Q6H PRN, Melvin, Alexander B, MD   Ampicillin-Sulbactam (UNASYN) 3 g in sodium chloride 0.9 % 100 mL IVPB, 3 g, Intravenous, Q6H, Katsaros, Dan Dun, RPH, Last Rate: 200 mL/hr at 07/22/23 1255, 3 g at 07/22/23 1255   [COMPLETED] chlordiazePOXIDE (LIBRIUM) capsule 25 mg, 25 mg, Oral, QID, 25 mg at 07/21/23 2218 **FOLLOWED BY** chlordiazePOXIDE (LIBRIUM) capsule 25 mg, 25 mg, Oral, TID, 25 mg at 07/22/23 1017 **FOLLOWED BY** [START ON 07/23/2023] chlordiazePOXIDE (LIBRIUM) capsule 25 mg, 25 mg, Oral, BH-qamhs **FOLLOWED BY** [START ON 07/24/2023] chlordiazePOXIDE (LIBRIUM) capsule 25 mg, 25 mg, Oral, Daily, Melvin, Alexander B, MD   clotrimazole-betamethasone (LOTRISONE) cream, , Topical, BID, Ogbata, Sylvester I, MD,  Given at 07/22/23 1017   folic acid (FOLVITE) tablet 1 mg, 1 mg, Oral, Daily, Melvin, Alexander B, MD, 1 mg at 07/22/23 1018   fondaparinux (ARIXTRA) injection 2.5 mg, 2.5 mg, Subcutaneous, Q24H, Ogbata, Sylvester I, MD, 2.5 mg at 07/21/23 2219   hydrOXYzine (ATARAX) tablet 25 mg, 25 mg, Oral, Q6H PRN, Melvin, Alexander B, MD   linezolid (ZYVOX) tablet 600 mg, 600 mg, Oral, Q12H, Chen, Lydia D, RPH, 600 mg at 07/22/23 1018   loperamide (IMODIUM) capsule 2-4 mg, 2-4 mg, Oral, PRN, Melvin, Alexander B, MD   LORazepam (ATIVAN) tablet 1-4 mg, 1-4 mg, Oral, Q1H PRN, 1 mg at 07/22/23 0149 **OR** LORazepam (ATIVAN) injection 1-4 mg, 1-4 mg, Intravenous, Q1H PRN, Melvin, Alexander B, MD   multivitamin with minerals tablet 1 tablet, 1 tablet, Oral, Daily, Melvin, Alexander B, MD, 1 tablet at 07/22/23 1017   nicotine (NICODERM CQ - dosed in mg/24 hours) patch 14 mg, 14 mg, Transdermal, Once, Chavez, Abigail, NP, 14 mg at 07/22/23 0605   ondansetron (ZOFRAN-ODT) disintegrating tablet 4 mg, 4 mg, Oral, Q6H PRN, Melvin, Alexander B, MD    polyethylene glycol (MIRALAX / GLYCOLAX) packet 17 g, 17 g, Oral, Daily PRN, Melvin, Alexander B, MD   sodium chloride flush (NS) 0.9 % injection 3 mL, 3 mL, Intravenous, Q12H, Melvin, Alexander B, MD, 3 mL at 07/22/23 1018   thiamine (VITAMIN B1) tablet 100 mg, 100 mg, Oral, Daily, Melvin, Alexander B, MD, 100 mg at 07/22/23 1017   triamcinolone 0.1 % cream : eucerin cream, 1:1 1 Application, 1 Application, Topical, BID, Melvin, Alexander B, MD, 1 Application at 07/22/23 1018  Allergies: Allergies  Allergen Reactions   Pork-Derived Products Other (See Comments)    Pt states he doesn't eat pork    Aireana Ryland, NP

## 2023-07-22 NOTE — Evaluation (Signed)
 Occupational Therapy Evaluation Patient Details Name: Jake Nguyen MRN: [id number] DOB: [birth year] Today's Date: 07/22/2023   History of Present Illness   Pt is a 58 y/o male presenting on 4/10 with worsening chronic wounds. Family with concerns of worsening wound on R LE and pt not seeking help. PMH includes: L AKA, anxiety, alcohol use, depression.     Clinical Impressions PTA patient reports completing ADLS with modified independence at [facility], living in his car and using his w/c for mobility.  Pt reports his brother assists putting his w/c in his car. Today, he requires min assist for donning sock to R foot due to bulky dressing but able to don shoe without assist; simulated lateral leans for LB clothing mgmt without assist.  He requires min assist to supervision, for transfers due to safety (and attempting to transfer into unlocked wheelchair). Anticipate cognition is at baseline.   Will follow acutely to optimize safety and independence, but anticipate no further needs after dc home.      If plan is discharge home, recommend the following:   A little help with walking and/or transfers;A little help with bathing/dressing/bathroom;Assist for transportation     Functional Status Assessment   Patient has had a recent decline in their functional status and demonstrates the ability to make significant improvements in function in a reasonable and predictable amount of time.     Equipment Recommendations   None recommended by OT     Recommendations for Other Services         Precautions/Restrictions   Precautions Precautions: Fall Recall of Precautions/Restrictions: Impaired Precaution/Restrictions Comments: poor safety awareness Restrictions Weight Bearing Restrictions Per Provider Order: No     Mobility Bed Mobility Overal bed mobility: Independent                  Transfers Overall transfer level: Needs assistance Equipment used: None Transfers: Bed  to chair/wheelchair/BSC, Sit to/from Stand Sit to Stand: Supervision, Min assist Stand pivot transfers: Min assist, Supervision         General transfer comment: initially minA to correct LOB as pt did not apply breaks on WC despite cues before impulsively attempting transfer to [facility]. on further attempts, pt completing with supervision once breaks applied      Balance Overall balance assessment: Needs assistance Sitting-balance support: No upper extremity supported, Feet supported Sitting balance-Leahy Scale: Good     Standing balance support: Bilateral upper extremity supported, During functional activity Standing balance-Leahy Scale: Poor Standing balance comment: dependent on UE support to manage safe transfer to [facility]                           ADL either performed or assessed with clinical judgement   ADL Overall ADL's : Needs assistance/impaired     Grooming: Sitting;Set up           Upper Body Dressing : Set up;Sitting   Lower Body Dressing: Minimal assistance;Sit to/from stand   Toilet Transfer: Minimal assistance;Supervision/safety   Toileting- Clothing Manipulation and Hygiene: Supervision/safety;Sitting/lateral lean       Functional mobility during ADLs: Contact guard assist;Supervision/safety       Vision Patient Visual Report: No change from baseline Vision Assessment?: No apparent visual deficits     Perception         Praxis         Pertinent Vitals/Pain Pain Assessment Pain Assessment: No/denies pain     Extremity/Trunk Assessment Upper Extremity Assessment  Upper Extremity Assessment: Overall WFL for tasks assessed   Lower Extremity Assessment Lower Extremity Assessment: Defer to PT evaluation RLE Deficits / Details: grossly 5/5 at knee and hip, limited assessment of foot due to wounds. pt reports sensation intact RLE Sensation: WNL RLE Coordination: WNL LLE Deficits / Details: limited assessment due to AKA, but grossly  functional ROM at hip LLE Sensation: WNL LLE Coordination: WNL   Cervical / Trunk Assessment Cervical / Trunk Assessment: Normal   Communication Communication Communication: No apparent difficulties   Cognition Arousal: Alert Behavior During Therapy: Impulsive Cognition: No family/caregiver present to determine baseline             OT - Cognition Comments: pt with poor safety awareness, perseverating on needing to leave.                 Following commands: Intact       Cueing  General Comments   Cueing Techniques: Verbal cues  VSS on RA, R foot wrapped after RN completed wound care   Exercises     Shoulder Instructions      Home Living Family/patient expects to be discharged to:: Unsure                                 Additional Comments: pt living in his car with his brother, using a WC at basline      Prior Functioning/Environment Prior Level of Function : Needs assist             Mobility Comments: pt using WC at baseline, completing stand-pivot transfers. ADLs Comments: pt reports independence    OT Problem List: Decreased activity tolerance;Impaired balance (sitting and/or standing);Decreased knowledge of precautions;Decreased safety awareness   OT Treatment/Interventions: Self-care/ADL training;Therapeutic exercise;DME and/or AE instruction;Therapeutic activities;Patient/family education      OT Goals(Current goals can be found in the care plan section)   Acute Rehab OT Goals Patient Stated Goal: get out of here OT Goal Formulation: With patient Time For Goal Achievement: 08/05/23 Potential to Achieve Goals: Good   OT Frequency:  Min 2X/week    Co-evaluation              AM-PAC OT "6 Clicks" Daily Activity     Outcome Measure Help from another person eating meals?: None Help from another person taking care of personal grooming?: A Little Help from another person toileting, which includes using toliet,  bedpan, or urinal?: A Little Help from another person bathing (including washing, rinsing, drying)?: A Little Help from another person to put on and taking off regular upper body clothing?: A Little Help from another person to put on and taking off regular lower body clothing?: A Little 6 Click Score: 19   End of Session Nurse Communication: Mobility status  Activity Tolerance: Patient tolerated treatment well Patient left: in chair;with call bell/phone within reach;with nursing/sitter in room  OT Visit Diagnosis: Other abnormalities of gait and mobility (R26.89)                Time: 4540-9811 OT Time Calculation (min): 12 min Charges:  OT General Charges $OT Visit: 1 Visit OT Evaluation $OT Eval Low Complexity: 1 Low  Bary Boss, OT Acute Rehabilitation Services Office [phone] Secure Chat Preferred    Fredrich Jefferson 07/22/2023, 1:40 PM

## 2023-07-22 NOTE — Discharge Summary (Signed)
 Physician Discharge Summary  Patient ID: Jake Nguyen MRN: [id number] DOB/AGE: 1965/05/10 58 y.o.  Admit date: 07/20/2023 Discharge date: Patient signed and left the hospital AGAINST MEDICAL ADVICE on 07/22/2023.    Admission Diagnoses:  Discharge Diagnoses:  Principal Problem:   Wound infection Active Problems:   Alcohol use   S/P BKA (below knee amputation) unilateral, left (HCC)   History of alcohol withdrawal syndrome   Right foot infection   Hospital Course: Patient is a 58 year old African-American male with past medical history significant for alcohol use and withdrawal, status post left above-knee amputation, anxiety and depression.  Patient was admitted with right lower extremity wound infection, chronic.  Patient was admitted and started on IV antibiotics.  Orthopedic team was consulted (Dr. Julio Ohm).  Dr. Julio Ohm had hoped to see patient on Tuesday, 07/24/2023 to discuss possible right BKA.  Unfortunately, patient signed himself out of the hospital AGAINST MEDICAL ADVICE.  Consults: Communicated with orthopedic team, Dr. Julio Ohm.   Discharge Exam: Blood pressure 125/72, pulse 70, temperature 97.9 F (36.6 C), temperature source Oral, resp. rate 20, SpO2 93%.   Disposition:       SignedDoroteo Nguyen 07/22/2023, 5:23 PM

## 2023-07-22 NOTE — Discharge Planning (Signed)
 Patient left the floor against our direction. AC and security located him outside and were able to get in touch with his brother. Brother picked him up at 1755 at the front of the hospital.

## 2023-07-22 NOTE — Evaluation (Signed)
 Physical Therapy Evaluation Patient Details Name: ZOHAIB HEENEY MRN: [id number] DOB: 09/20/1965 Today's Date: 07/22/2023  History of Present Illness  Pt is a 58 y/o male presenting on 4/10 with worsening chronic wounds. Family with concerns of worsening wound on R LE and pt not seeking help. PMH includes: L AKA, anxiety, alcohol use, depression.   Clinical Impression  Pt in bed upon arrival of PT, agreeable to evaluation at this time. Prior to admission the pt was independent with transfers to and from [facility] with stand-pivot transfer, reports living out of his car with his brother who assists at times. The pt was able to demo good independence with bed mobility, stand-pivot transfer to [facility], and back to bed, as well as managing WC for propulsion and navigating in a tight space. He did need cues for locking of WC and had episode of needing minA to correct LOB when attempting to transfer to [facility] without locking breaks. The pt demos poor safety awareness and decision making in session, no family present to confirm if pt at baseline. Will continue to follow acutely to further improve safety with transfers and mobility, anticipate no further PT needs after d/c.     If plan is discharge home, recommend the following: A little help with walking and/or transfers;A little help with bathing/dressing/bathroom;Assist for transportation;Help with stairs or ramp for entrance   Can travel by private vehicle        Equipment Recommendations Wheelchair (measurements PT);Wheelchair cushion (measurements PT)  Recommendations for Other Services       Functional Status Assessment Patient has had a recent decline in their functional status and demonstrates the ability to make significant improvements in function in a reasonable and predictable amount of time.     Precautions / Restrictions Precautions Precautions: Fall Recall of Precautions/Restrictions: Impaired Precaution/Restrictions Comments: poor safety  awareness Restrictions Weight Bearing Restrictions Per Provider Order: No      Mobility  Bed Mobility Overal bed mobility: Independent                  Transfers Overall transfer level: Needs assistance Equipment used: None Transfers: Bed to chair/wheelchair/BSC, Sit to/from Stand Sit to Stand: Supervision, Min assist Stand pivot transfers: Min assist, Supervision         General transfer comment: initially minA to correct LOB as pt did not apply breaks on WC despite cues before impulsively attempting transfer to [facility]. on further attempts, pt completing with supervision once breaks applied    Ambulation/Gait               General Gait Details: not tested as pt reports not ambulatory at baseline, only using his WC and transfers      Balance Overall balance assessment: Needs assistance Sitting-balance support: No upper extremity supported, Feet supported Sitting balance-Leahy Scale: Good     Standing balance support: Bilateral upper extremity supported, During functional activity Standing balance-Leahy Scale: Poor Standing balance comment: dependent on UE support to manage safe transfer to [facility]                             Pertinent Vitals/Pain Pain Assessment Pain Assessment: No/denies pain    Home Living Family/patient expects to be discharged to:: Unsure                   Additional Comments: pt living in his car with his brother, using a WC at basline    Prior  Function Prior Level of Function : Needs assist             Mobility Comments: pt using WC at baseline, completing stand-pivot transfers. ADLs Comments: pt reports independence     Extremity/Trunk Assessment   Upper Extremity Assessment Upper Extremity Assessment: Overall WFL for tasks assessed    Lower Extremity Assessment Lower Extremity Assessment: Defer to PT evaluation RLE Deficits / Details: grossly 5/5 at knee and hip, limited assessment of foot due to  wounds. pt reports sensation intact RLE Sensation: WNL RLE Coordination: WNL LLE Deficits / Details: limited assessment due to AKA, but grossly functional ROM at hip LLE Sensation: WNL LLE Coordination: WNL    Cervical / Trunk Assessment Cervical / Trunk Assessment: Normal  Communication   Communication Communication: No apparent difficulties    Cognition Arousal: Alert Behavior During Therapy: Impulsive   PT - Cognitive impairments: No family/caregiver present to determine baseline, Awareness, Safety/Judgement                       PT - Cognition Comments: pt with limited safety/insight. poor decision making but no family present to determine baseline Following commands: Intact       Cueing Cueing Techniques: Verbal cues     General Comments General comments (skin integrity, edema, etc.): VSS on RA, R foot wrapped after RN completed wound care        Assessment/Plan    PT Assessment Patient needs continued PT services  PT Problem List Decreased balance;Decreased mobility;Decreased cognition;Decreased safety awareness       PT Treatment Interventions DME instruction;Gait training;Stair training;Functional mobility training;Therapeutic activities;Therapeutic exercise;Balance training;Wheelchair mobility training;Patient/family education    PT Goals (Current goals can be found in the Care Plan section)  Acute Rehab PT Goals Patient Stated Goal: retrun home asap PT Goal Formulation: With patient Time For Goal Achievement: 08/05/23 Potential to Achieve Goals: Fair    Frequency Min 1X/week        AM-PAC PT "6 Clicks" Mobility  Outcome Measure Help needed turning from your back to your side while in a flat bed without using bedrails?: None Help needed moving from lying on your back to sitting on the side of a flat bed without using bedrails?: None Help needed moving to and from a bed to a chair (including a wheelchair)?: A Little Help needed standing up  from a chair using your arms (e.g., wheelchair or bedside chair)?: A Little Help needed to walk in hospital room?: A Lot Help needed climbing 3-5 steps with a railing? : Total 6 Click Score: 17    End of Session   Activity Tolerance: Patient tolerated treatment well Patient left: in chair;with call bell/phone within reach;with nursing/sitter in room Nurse Communication: Mobility status (pt asking for order to go outside to smoke) PT Visit Diagnosis: Unsteadiness on feet (R26.81)    Time: 4098-1191 PT Time Calculation (min) (ACUTE ONLY): 12 min   Charges:   PT Evaluation $PT Eval Low Complexity: 1 Low   PT General Charges $$ ACUTE PT VISIT: 1 Visit         Barnabas Booth, PT, DPT   Acute Rehabilitation Department Office [phone] Secure Chat Communication Preferred  Lona Rist 07/22/2023, 2:09 PM

## 2023-07-22 NOTE — Discharge Planning (Signed)
 Patient alert.  Patient decided to leave AMA.  He signed the AMA form at 3:42. I took him down to the front of the hospital so his family could pick him up and they were not there. Brought him back to the room to wait for their arrival. MD notified.

## 2023-07-22 NOTE — Plan of Care (Signed)
   Problem: Education: Goal: Knowledge of General Education information will improve Description: Including pain rating scale, medication(s)/side effects and non-pharmacologic comfort measures Outcome: Progressing   Problem: Clinical Measurements: Goal: Ability to maintain clinical measurements within normal limits will improve Outcome: Progressing Goal: Will remain free from infection Outcome: Progressing

## 2023-07-25 LAB — CULTURE, BLOOD (ROUTINE X 2)
Culture: NO GROWTH
Culture: NO GROWTH
Special Requests: ADEQUATE

## 2023-08-19 ENCOUNTER — Emergency Department (HOSPITAL_COMMUNITY): Payer: MEDICAID

## 2023-08-19 ENCOUNTER — Inpatient Hospital Stay (HOSPITAL_COMMUNITY)
Admission: EM | Admit: 2023-08-19 | Discharge: 2023-08-24 | DRG: 240 | Disposition: A | Discharge status: Home / Self Care | Payer: MEDICAID | Attending: Internal Medicine | Admitting: Internal Medicine

## 2023-08-19 ENCOUNTER — Other Ambulatory Visit: Payer: Self-pay

## 2023-08-19 DIAGNOSIS — E861 Hypovolemia: Secondary | ICD-10-CM | POA: Diagnosis not present

## 2023-08-19 DIAGNOSIS — Z720 Tobacco use: Secondary | ICD-10-CM | POA: Diagnosis present

## 2023-08-19 DIAGNOSIS — Z5902 Unsheltered homelessness: Secondary | ICD-10-CM | POA: Diagnosis not present

## 2023-08-19 DIAGNOSIS — F418 Other specified anxiety disorders: Secondary | ICD-10-CM | POA: Diagnosis present

## 2023-08-19 DIAGNOSIS — I70261 Atherosclerosis of native arteries of extremities with gangrene, right leg: Principal | ICD-10-CM | POA: Diagnosis present

## 2023-08-19 DIAGNOSIS — Z79899 Other long term (current) drug therapy: Secondary | ICD-10-CM

## 2023-08-19 DIAGNOSIS — E872 Acidosis, unspecified: Secondary | ICD-10-CM | POA: Diagnosis present

## 2023-08-19 DIAGNOSIS — E871 Hypo-osmolality and hyponatremia: Secondary | ICD-10-CM | POA: Diagnosis present

## 2023-08-19 DIAGNOSIS — I96 Gangrene, not elsewhere classified: Principal | ICD-10-CM | POA: Diagnosis present

## 2023-08-19 DIAGNOSIS — Z89612 Acquired absence of left leg above knee: Secondary | ICD-10-CM | POA: Diagnosis not present

## 2023-08-19 DIAGNOSIS — E876 Hypokalemia: Secondary | ICD-10-CM | POA: Diagnosis present

## 2023-08-19 DIAGNOSIS — Y908 Blood alcohol level of 240 mg/100 ml or more: Secondary | ICD-10-CM | POA: Diagnosis present

## 2023-08-19 DIAGNOSIS — I959 Hypotension, unspecified: Secondary | ICD-10-CM | POA: Diagnosis present

## 2023-08-19 DIAGNOSIS — E860 Dehydration: Secondary | ICD-10-CM | POA: Diagnosis present

## 2023-08-19 DIAGNOSIS — F1020 Alcohol dependence, uncomplicated: Secondary | ICD-10-CM | POA: Diagnosis present

## 2023-08-19 DIAGNOSIS — F17210 Nicotine dependence, cigarettes, uncomplicated: Secondary | ICD-10-CM | POA: Diagnosis present

## 2023-08-19 DIAGNOSIS — L97818 Non-pressure chronic ulcer of other part of right lower leg with other specified severity: Secondary | ICD-10-CM | POA: Diagnosis present

## 2023-08-19 DIAGNOSIS — F32A Depression, unspecified: Secondary | ICD-10-CM | POA: Diagnosis present

## 2023-08-19 DIAGNOSIS — F10220 Alcohol dependence with intoxication, uncomplicated: Secondary | ICD-10-CM | POA: Diagnosis present

## 2023-08-19 LAB — CBC WITH DIFFERENTIAL/PLATELET
Abs Immature Granulocytes: 0.01 10*3/uL (ref 0.00–0.07)
Basophils Absolute: 0.1 10*3/uL (ref 0.0–0.1)
Basophils Relative: 1 %
Eosinophils Absolute: 0.3 10*3/uL (ref 0.0–0.5)
Eosinophils Relative: 5 %
HCT: 43.8 % (ref 39.0–52.0)
Hemoglobin: 14.2 g/dL (ref 13.0–17.0)
Immature Granulocytes: 0 %
Lymphocytes Relative: 34 %
Lymphs Abs: 2.3 10*3/uL (ref 0.7–4.0)
MCH: 29.6 pg (ref 26.0–34.0)
MCHC: 32.4 g/dL (ref 30.0–36.0)
MCV: 91.4 fL (ref 80.0–100.0)
Monocytes Absolute: 0.9 10*3/uL (ref 0.1–1.0)
Monocytes Relative: 13 %
Neutro Abs: 3.3 10*3/uL (ref 1.7–7.7)
Neutrophils Relative %: 47 %
Platelets: 217 10*3/uL (ref 150–400)
RBC: 4.79 MIL/uL (ref 4.22–5.81)
RDW: 14.5 % (ref 11.5–15.5)
WBC: 7 10*3/uL (ref 4.0–10.5)
nRBC: 0 % (ref 0.0–0.2)

## 2023-08-19 LAB — COMPREHENSIVE METABOLIC PANEL WITH GFR
ALT: 15 U/L (ref 0–44)
AST: 35 U/L (ref 15–41)
Albumin: 2.9 g/dL — ABNORMAL LOW (ref 3.5–5.0)
Alkaline Phosphatase: 58 U/L (ref 38–126)
Anion gap: 14 (ref 5–15)
BUN: <5 mg/dL — ABNORMAL LOW (ref 6–20)
CO2: 24 mmol/L (ref 22–32)
Calcium: 8.1 mg/dL — ABNORMAL LOW (ref 8.9–10.3)
Chloride: 100 mmol/L (ref 98–111)
Creatinine, Ser: 0.58 mg/dL — ABNORMAL LOW (ref 0.61–1.24)
GFR, Estimated: >60 mL/min (ref >60)
Glucose, Bld: 105 mg/dL — ABNORMAL HIGH (ref 70–99)
Potassium: 3.4 mmol/L — ABNORMAL LOW (ref 3.5–5.1)
Sodium: 138 mmol/L (ref 135–145)
Total Bilirubin: 0.3 mg/dL (ref 0.0–1.2)
Total Protein: 7.7 g/dL (ref 6.5–8.1)

## 2023-08-19 LAB — RAPID URINE DRUG SCREEN, HOSP PERFORMED
Amphetamines: NOT DETECTED
Barbiturates: NOT DETECTED
Benzodiazepines: NOT DETECTED
Cocaine: NOT DETECTED
Opiates: NOT DETECTED
Tetrahydrocannabinol: POSITIVE — AB

## 2023-08-19 LAB — ETHANOL: Alcohol, Ethyl (B): 312 mg/dL (ref <15)

## 2023-08-19 LAB — I-STAT CG4 LACTIC ACID, ED: Lactic Acid, Venous: 3.1 mmol/L (ref 0.5–1.9)

## 2023-08-19 MED ORDER — LACTATED RINGERS IV SOLN: INTRAVENOUS | Status: AC

## 2023-08-19 MED ORDER — ACETAMINOPHEN 325 MG PO TABS
650.0000 mg | ORAL_TABLET | Freq: Four times a day (QID) | ORAL | Status: DC | PRN
  Administered 2023-08-19: 650 mg via ORAL
  Filled 2023-08-19: qty 2

## 2023-08-19 MED ORDER — LOPERAMIDE HCL 2 MG PO CAPS: 2.0000 mg | ORAL_CAPSULE | ORAL | Status: AC | PRN

## 2023-08-19 MED ORDER — METRONIDAZOLE 500 MG/100ML IV SOLN
500.0000 mg | Freq: Two times a day (BID) | INTRAVENOUS | Status: DC
  Administered 2023-08-19 – 2023-08-22 (×6): 500 mg via INTRAVENOUS
  Filled 2023-08-19 (×6): qty 100

## 2023-08-19 MED ORDER — CHLORDIAZEPOXIDE HCL 5 MG PO CAPS
25.0000 mg | ORAL_CAPSULE | Freq: Four times a day (QID) | ORAL | Status: DC
  Administered 2023-08-19 – 2023-08-20 (×4): 25 mg via ORAL
  Filled 2023-08-19 (×4): qty 5

## 2023-08-19 MED ORDER — VANCOMYCIN HCL IN DEXTROSE 1-5 GM/200ML-% IV SOLN
1000.0000 mg | Freq: Once | INTRAVENOUS | Status: AC
  Administered 2023-08-19: 1000 mg via INTRAVENOUS
  Filled 2023-08-19: qty 200

## 2023-08-19 MED ORDER — CHLORDIAZEPOXIDE HCL 5 MG PO CAPS: 25.0000 mg | ORAL_CAPSULE | ORAL | Status: DC

## 2023-08-19 MED ORDER — FOLIC ACID 1 MG PO TABS
1.0000 mg | ORAL_TABLET | Freq: Every day | ORAL | Status: DC
  Administered 2023-08-19 – 2023-08-24 (×6): 1 mg via ORAL
  Filled 2023-08-19 (×6): qty 1

## 2023-08-19 MED ORDER — SODIUM CHLORIDE 0.9 % IV BOLUS
2000.0000 mL | Freq: Once | INTRAVENOUS | Status: AC
  Administered 2023-08-19: 2000 mL via INTRAVENOUS

## 2023-08-19 MED ORDER — THIAMINE MONONITRATE 100 MG PO TABS
100.0000 mg | ORAL_TABLET | Freq: Every day | ORAL | Status: DC
  Administered 2023-08-19 – 2023-08-22 (×4): 100 mg via ORAL
  Filled 2023-08-19 (×6): qty 1

## 2023-08-19 MED ORDER — SODIUM CHLORIDE 0.9% FLUSH
3.0000 mL | Freq: Two times a day (BID) | INTRAVENOUS | Status: DC
  Administered 2023-08-19 – 2023-08-24 (×8): 3 mL via INTRAVENOUS

## 2023-08-19 MED ORDER — LORAZEPAM 2 MG/ML IJ SOLN: 1.0000 mg | INTRAMUSCULAR | Status: AC | PRN

## 2023-08-19 MED ORDER — ONDANSETRON HCL 4 MG PO TABS: 4.0000 mg | ORAL_TABLET | Freq: Four times a day (QID) | ORAL | Status: DC | PRN

## 2023-08-19 MED ORDER — LORAZEPAM 1 MG PO TABS: 1.0000 mg | ORAL_TABLET | ORAL | Status: AC | PRN

## 2023-08-19 MED ORDER — SODIUM CHLORIDE 0.9 % IV SOLN
1.0000 g | Freq: Three times a day (TID) | INTRAVENOUS | Status: DC
  Administered 2023-08-19: 1 g via INTRAVENOUS
  Filled 2023-08-19 (×3): qty 10

## 2023-08-19 MED ORDER — ACETAMINOPHEN 650 MG RE SUPP: 650.0000 mg | Freq: Four times a day (QID) | RECTAL | Status: DC | PRN

## 2023-08-19 MED ORDER — ONDANSETRON HCL 4 MG/2ML IJ SOLN
4.0000 mg | Freq: Four times a day (QID) | INTRAMUSCULAR | Status: DC | PRN
  Administered 2023-08-24: 4 mg via INTRAVENOUS
  Filled 2023-08-19: qty 2

## 2023-08-19 MED ORDER — CHLORDIAZEPOXIDE HCL 5 MG PO CAPS: 25.0000 mg | ORAL_CAPSULE | Freq: Every day | ORAL | Status: DC

## 2023-08-19 MED ORDER — POTASSIUM CHLORIDE 20 MEQ PO PACK
40.0000 meq | PACK | Freq: Once | ORAL | Status: AC
  Administered 2023-08-19: 40 meq via ORAL
  Filled 2023-08-19: qty 2

## 2023-08-19 MED ORDER — LORAZEPAM 1 MG PO TABS
1.0000 mg | ORAL_TABLET | ORAL | Status: DC | PRN
  Administered 2023-08-19: 1 mg via ORAL
  Filled 2023-08-19: qty 1

## 2023-08-19 MED ORDER — VANCOMYCIN HCL IN DEXTROSE 1-5 GM/200ML-% IV SOLN
1000.0000 mg | Freq: Two times a day (BID) | INTRAVENOUS | Status: DC
  Administered 2023-08-20 – 2023-08-22 (×5): 1000 mg via INTRAVENOUS
  Filled 2023-08-19 (×5): qty 200

## 2023-08-19 MED ORDER — THIAMINE HCL 100 MG/ML IJ SOLN
100.0000 mg | Freq: Every day | INTRAMUSCULAR | Status: DC
  Administered 2023-08-23 – 2023-08-24 (×2): 100 mg via INTRAVENOUS
  Filled 2023-08-19 (×3): qty 2

## 2023-08-19 MED ORDER — ADULT MULTIVITAMIN W/MINERALS CH
1.0000 | ORAL_TABLET | Freq: Every day | ORAL | Status: DC
  Administered 2023-08-19 – 2023-08-24 (×6): 1 via ORAL
  Filled 2023-08-19 (×6): qty 1

## 2023-08-19 MED ORDER — CHLORDIAZEPOXIDE HCL 5 MG PO CAPS: 25.0000 mg | ORAL_CAPSULE | Freq: Three times a day (TID) | ORAL | Status: DC

## 2023-08-19 MED ORDER — SENNOSIDES-DOCUSATE SODIUM 8.6-50 MG PO TABS: 1.0000 | ORAL_TABLET | Freq: Every evening | ORAL | Status: DC | PRN

## 2023-08-19 MED ORDER — SODIUM CHLORIDE 0.9 % IV SOLN
2.0000 g | Freq: Once | INTRAVENOUS | Status: AC
  Administered 2023-08-19: 2 g via INTRAVENOUS
  Filled 2023-08-19: qty 12.5

## 2023-08-19 NOTE — Progress Notes (Addendum)
 Pharmacy Antibiotic Note  Jake Nguyen is a 58 y.o. male admitted on 08/19/2023 with a right leg wound infection .  Pharmacy has been consulted for cefepime  and vancomycin  dosing.  -cefepime  2gm given at 4pm. vancomycin  1gm given at 5:30pm  Plan: -Cefepime  1gm IV q8h -vancomycin  1000mg  IV q12h (Estimated AUC= 475 using SCr 0.8( -Will follow renal function, cultures and clinical progress   Height: 5\' 10"  (177.8 cm) Weight: 63 kg (138 lb 14.2 oz) IBW/kg (Calculated) : 73  Temp (24hrs), Avg:98.3 F (36.8 C), Min:98.3 F (36.8 C), Max:98.3 F (36.8 C)  Recent Labs  Lab 08/19/23 1547 08/19/23 1605  WBC 7.0  --   CREATININE 0.58*  --   LATICACIDVEN  --  3.1*    Estimated Creatinine Clearance: 91.9 mL/min (A) (by C-G formula based on SCr of 0.58 mg/dL (L)).    Allergies  Allergen Reactions   Pork-Derived Products Other (See Comments)    Pt states he doesn't eat pork    Antimicrobials this admission: 5/10 cefepime  5/10 vanc  Dose adjustments this admission:   Microbiology results: 5/10 blood x2  Thank you for allowing pharmacy to be a part of this patient's care.  Baxter Limber, PharmD Clinical Pharmacist **Pharmacist phone directory can now be found on [email or web address] ([email or web address]).  Listed under [facility].

## 2023-08-19 NOTE — ED Provider Notes (Signed)
  Physical Exam  There were no vitals taken for this visit.  Physical Exam  Procedures  Procedures  ED Course / MDM   Clinical Course as of 08/19/23 1812  Sat Aug 19, 2023  1456 Assumed care from Dr Martina Sledge. 58 yo M with hx of homelessness and left lower extremity BKA who presents emergency department with right foot wound.  Recently left AMA from the hospital.  Was seen by orthopedics (Dr. Julio Ohm) who is going to talk to him about right BKA but he left AMA.  Comes back with worsening infection. [RP]  1745 Dr Julio Ohm from ortho recommends npo at midnight for possible procedure.  [RP]  1811 Dr. Lydia Sams from hospitalist contacted regarding admission.  X-ray without any free air.  Labs showed that his lactic acid was 3.1.  Was given 30 mL/kg of IV fluids.  Alcohol  level is also 312.  Started on CIWA. [RP]    Clinical Course User Index [RP] Ninetta Basket, MD   Medical Decision Making Amount and/or Complexity of Data Reviewed Labs: ordered. Radiology: ordered.  Risk OTC drugs. Prescription drug management. Decision regarding hospitalization.      Ninetta Basket, MD 08/19/23 [phone]

## 2023-08-19 NOTE — H&P (Signed)
 History and Physical    Jake Nguyen ZDG:[id number] DOB: Sep 09, 1965 DOA: 08/19/2023  PCP: Trenton Frock, FNP  Patient coming from: Homeless  I have personally briefly reviewed patient's old medical records in [facility]  Chief Complaint: Worsening right leg wound infection  HPI: Jake Nguyen is a 58 y.o. male with medical history significant for alcohol  use disorder, s/p left AKA, depression/anxiety, tobacco use, homelessness, and chronic right lower leg and foot infection who presented to the ED for evaluation of worsening pain and infection of right lower extremity.  Patient has prior history of left AKA due to infection.  He was last admitted on 4/10 for worsening of right lower extremity wound infection.  Case was discussed with Dr. Julio Ohm who was going to see patient to discuss possible right BKA however patient ultimately left AGAINST MEDICAL ADVICE on 4/14.  Patient returns with worsening pain, swelling, infectious appearance, and odor of his right foot.  His leg was covered in maggots on arrival.  He reports daily alcohol  use, maybe a 12 pack of beer daily.  He also reports smoking up to 1 pack of cigarettes daily.  He says he is living on the streets.  ED Course  Labs/Imaging on admission: I have personally reviewed following labs and imaging studies.  Initial vitals showed BP 123/85, pulse 108, RR 18, temp 98.3 F, SpO2 95% on room air.  Labs showed WBC 7.0, hemoglobin 14.2, platelets 217, sodium 138, potassium 3.4, bicarb 24, BUN <5, creatinine 0.58, serum glucose 105, LFTs within normal limits, lactic acid 3.1, serum ethanol 312.  Blood cultures in process.  UDS in process.  Right foot x-ray showed diffuse subcutaneous soft tissue swelling but no gas in the soft tissues.  No plain film findings for osteomyelitis.  Patient was given 2 L normal saline, IV vancomycin , cefepime , Flagyl , and placed on CIWA protocol.  EDP discussed with orthopedics Dr. Julio Ohm who  recommended keeping patient n.p.o. at midnight.  The hospitalist service was consulted to admit.  Review of Systems: All systems reviewed and are negative except as documented in history of present illness above.   Past Medical History:  Diagnosis Date   Acute metabolic encephalopathy 11/14/2020   Alcohol  abuse with alcohol -induced mood disorder (HCC) 11/10/2019   Alcohol  withdrawal syndrome, with delirium (HCC) 11/06/2019   Alcoholism (HCC)    Anxiety    Depression    Influenza A 05/20/2018   Sepsis (HCC) 11/14/2020   Septic shock (HCC) 11/24/2020    Past Surgical History:  Procedure Laterality Date   ABDOMINAL AORTOGRAM W/LOWER EXTREMITY N/A 08/04/2020   Procedure: ABDOMINAL AORTOGRAM W/LOWER EXTREMITY;  Surgeon: Margherita Shell, MD;  Location: [facility] INVASIVE CV LAB;  Service: Cardiovascular;  Laterality: N/A;   AMPUTATION Left 08/05/2020   Procedure: AMPUTATION BELOW KNEE;  Surgeon: Margherita Shell, MD;  Location: [facility] OR;  Service: Vascular;  Laterality: Left;   AMPUTATION Left 11/21/2020   Procedure: REVISION LEFT ABOVE KNEE AMPUTATION;  Surgeon: Margherita Shell, MD;  Location: [facility] OR;  Service: Vascular;  Laterality: Left;    Social History: He reports daily alcohol  use, maybe a 12 pack of beer daily.  He also reports smoking up to 1 pack of cigarettes daily.  He says he is living on the streets.  Allergies  Allergen Reactions   Pork-Derived Products Other (See Comments)    Pt states he doesn't eat pork    Family History  Family history unknown: Yes     Prior to  Admission medications   Medication Sig Start Date End Date Taking? Authorizing Provider  atorvastatin  (LIPITOR) 40 MG tablet Take 1 tablet (40 mg total) by mouth daily. Patient not taking: Reported on 11/19/2020 08/27/20   Lovell Rubenstein, NP  folic acid  (FOLVITE ) 1 MG tablet Take 1 tablet (1 mg total) by mouth daily. Patient not taking: Reported on 11/19/2020 08/27/20   Lovell Rubenstein, NP  Multiple Vitamin  (MULTIVITAMIN WITH MINERALS) TABS tablet Take 1 tablet by mouth daily. Patient not taking: Reported on 03/19/2021 11/27/20   Verlyn Goad, MD    Physical Exam: Vitals:   08/19/23 1821 08/19/23 1830 08/19/23 1840 08/19/23 1842  BP:  96/70 96/70 96/70   Pulse:   (!) 108 (!) 124  Resp:  18 (!) 21   Temp:   98.3 F (36.8 C)   TempSrc:   Oral   SpO2:   92%   Weight: 63 kg     Height: 5\' 10"  (1.778 m)      Constitutional: Chronically ill-appearing man resting in bed.  NAD, not very engaged in conversation Eyes: EOMI, lids and conjunctivae normal ENMT: Mucous membranes are moist. Posterior pharynx clear of any exudate or lesions. Neck: normal, supple, no masses. Respiratory: clear to auscultation bilaterally, no wheezing, no crackles. Normal respiratory effort. No accessory muscle use.  Cardiovascular: Tachycardic, no murmurs / rubs / gallops. Abdomen: no tenderness, no masses palpated. Musculoskeletal: S/p left BKA Skin: Multiple open circular wounds on the right foot with gangrenous and cellulitic appearance extending proximally, malodorous Neurologic: Strength equal bilaterally Psychiatric: Alert and oriented x 3.        EKG: Personally reviewed. Sinus rhythm, rate 103, no acute ischemic changes.  Assessment/Plan Principal Problem:   Gangrene of right foot (HCC) Active Problems:   Alcohol  use disorder, severe, dependence (HCC)   Hypotension   Hypokalemia   Depression with anxiety   Tobacco use   Jake Nguyen is a 58 y.o. male with medical history significant for alcohol  use disorder, s/p left AKA, depression/anxiety, tobacco use, homelessness, and chronic right lower leg and foot infection who is admitted with gangrene of right lower extremity.  Assessment and Plan: Gangrene of right lower extremity including foot with multiple wounds: Chronic and worsening wounds.  Right leg covered in maggots on arrival.  Was recommended BKA when admitted last month but he left  AMA. - Continue broad-spectrum antibiotics with IV vancomycin , cefepime , Flagyl  - Follow blood cultures - EDP spoke with orthopedics Dr. Julio Ohm who will consult  Alcohol  use disorder with intoxication: Patient reports drinking around 12 beers daily.  He is intoxicated with serum ethanol 312 on admission.  High risk for withdrawal. - Placed on CIWA protocol with Ativan  as needed - Continue thiamine , folate, MVM - Start Librium  taper  Hypotension: Borderline hypotensive on arrival.  Received IV fluid bolus with improvement. - Continue IV fluid hydration overnight  Hypokalemia: Supplementing.  Depression/anxiety: Not taking any medications.  Tobacco use: Patient reports smoking about 1 pack/day.  He declines nicotine  patch.  Homelessness: TOC consult.   DVT prophylaxis: SCDs Start: 08/19/23 1925 Code Status:   Code Status: Full Code  Family Communication: None available on admission Disposition Plan: Pending clinical progress Consults called: Orthopedics (Dr. Julio Ohm) Severity of Illness: The appropriate patient status for this patient is INPATIENT. Inpatient status is judged to be reasonable and necessary in order to provide the required intensity of service to ensure the patient's safety. The patient's presenting symptoms, physical exam findings, and  initial radiographic and laboratory data in the context of their chronic comorbidities is felt to place them at high risk for further clinical deterioration. Furthermore, it is not anticipated that the patient will be medically stable for discharge from the hospital within 2 midnights of admission.   * I certify that at the point of admission it is my clinical judgment that the patient will require inpatient hospital care spanning beyond 2 midnights from the point of admission due to high intensity of service, high risk for further deterioration and high frequency of surveillance required.Edith Gores MD [facility]  If  7PM-7AM, please contact night-coverage [email or web address]  08/19/2023, 7:45 PM

## 2023-08-19 NOTE — ED Provider Notes (Signed)
 [place] EMERGENCY DEPARTMENT AT [facility] Provider Note  CSN: [id number] Arrival date & time: 08/19/23 1430  Chief Complaint(s) Wound Infection  HPI Jake Nguyen is a 58 y.o. male with past medical history as below, significant for etoh abuse, anxiety/ depression, homeless, left BKA who presents to the ED with complaint of right leg wound  He was seen last month, he was recommended for admission, evaluation was pending for right sided BKA with Dr. Julio Ohm but he left AMA.  Reports today for worsening right leg pain, swelling, odor, drainage.  Patient reports increasing malodorous discharge, unable to ambulate.  Leg is covered in maggots.   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 Authorizing Provider  atorvastatin  (LIPITOR) 40 MG tablet Take 1 tablet (40 mg total) by mouth daily. Patient  not taking: Reported on 11/19/2020 08/27/20   Lovell Rubenstein, NP  folic acid  (FOLVITE ) 1 MG tablet Take 1 tablet (1 mg total) by mouth daily. Patient not taking: Reported on 11/19/2020 08/27/20   Lovell Rubenstein, NP  Multiple Vitamin (MULTIVITAMIN WITH MINERALS) TABS tablet Take 1 tablet by mouth daily. Patient not taking: Reported on 03/19/2021 11/27/20   Verlyn Goad, MD                                                                                                                                    Past Surgical History Past Surgical History:  Procedure Laterality Date   ABDOMINAL AORTOGRAM W/LOWER EXTREMITY N/A 08/04/2020   Procedure: ABDOMINAL AORTOGRAM W/LOWER EXTREMITY;  Surgeon: Margherita Shell, MD;  Location: [facility] IN[facility]IVE [facility];  Service: Cardiovascular;  Laterality: N/A;   AMPUTATION Left 08/05/2020   Procedure: AMPUTATION BELOW KNEE;  Surgeon: Margherita Shell, MD;  Location: [facility] OR;  Service: Vascular;  Laterality: Left;  AMPUTATION Left 11/21/2020   Procedure: REVISION LEFT ABOVE KNEE AMPUTATION;  Surgeon: Margherita Shell, MD;  Location: [facility] OR;  Service: Vascular;  Laterality: Left;   Family History Family History  Family history unknown: Yes    Social History Social History   Tobacco Use   Smoking status: Every Day    Current packs/day: 0.50    Types: Cigarettes   Smokeless tobacco: Never  Substance Use Topics   Alcohol  use: Yes    Comment: 6 pack perday   Drug use: Yes    Types: Marijuana   Allergies Pork-derived products  Review of Systems A thorough review of systems was obtained and all systems are negative except as noted in the HPI and PMH.   Physical Exam Vital Signs  I have reviewed the triage vital signs There were no vitals taken for this visit. Physical Exam Vitals and nursing note reviewed. Exam conducted with a chaperone present.  Constitutional:      General: He is not in acute distress.    Appearance: He is well-developed. He is not  ill-appearing.     Comments: Disheveled, malodorous   HENT:     Head: Normocephalic and atraumatic.     Right Ear: External ear normal.     Left Ear: External ear normal.     Nose: Nose normal.     Mouth/Throat:     Mouth: Mucous membranes are moist.  Eyes:     General: No scleral icterus.       Right eye: No discharge.        Left eye: No discharge.  Cardiovascular:     Rate and Rhythm: Normal rate.  Pulmonary:     Effort: Pulmonary effort is normal. No respiratory distress.     Breath sounds: No stridor.  Abdominal:     General: Abdomen is flat. There is no distension.     Palpations: Abdomen is soft.     Tenderness: There is no guarding.  Musculoskeletal:        General: No deformity.     Cervical back: No rigidity.     Comments: Left bka Right leg with multiple wounds, cellulitis changes   Skin:    General: Skin is warm and dry.     Coloration: Skin is not cyanotic, jaundiced or pale.  Neurological:     Mental Status: He is alert and oriented to person, place, and time.     GCS: GCS eye subscore is 4. GCS verbal subscore is 5. GCS motor subscore is 6.  Psychiatric:        Speech: Speech normal.        Behavior: Behavior normal. Behavior is cooperative.          ED Results and Treatments Labs (all labs ordered are listed, but only abnormal results are displayed) Labs Reviewed  CULTURE, BLOOD (ROUTINE X 2)  CULTURE, BLOOD (ROUTINE X 2)  CBC WITH DIFFERENTIAL/PLATELET  COMPREHENSIVE METABOLIC PANEL WITH GFR  ETHANOL  RAPID URINE DRUG SCREEN, HOSP PERFORMED  LACTIC ACID, PLASMA  LACTIC ACID, PLASMA  Radiology No results found.  Pertinent labs & imaging results that were available during my care of the patient were reviewed by me and considered in my medical decision making (see MDM for details).  Medications Ordered in ED Medications   vancomycin  (VANCOCIN ) IVPB 1000 mg/200 mL premix (has no administration in time range)  ceFEPIme  (MAXIPIME ) 2 g in sodium chloride  0.9 % 100 mL IVPB (has no administration in time range)                                                                                                                                     Procedures Procedures  (including critical care time)  Medical Decision Making / ED Course    Medical Decision Making:    Jake Nguyen is a 58 y.o. male with past medical history as below, significant for etoh abuse, anxiety/ depression, homeless, left BKA who presents to the ED with complaint of right leg wound. The complaint involves an extensive differential diagnosis and also carries with it a high risk of complications and morbidity.  Serious etiology was considered. Ddx includes but is not limited to: Cellulitis, gangrene, osteomyelitis, necrotizing skin infection, etc.  Complete initial physical exam performed, notably the patient was in no distress, HDS.    Reviewed and confirmed nursing documentation for past medical history, family history, social history.  Vital signs reviewed.     Brief summary:  57 year old male history of alcohol  abuse, homeless here with wound to his right leg    Clinical Course as of 08/19/23 1523  Sat Aug 19, 2023  1456 Assumed care from Dr Martina Sledge. 58 yo M with hx of homelessness and left lower extremity BKA who presents emergency department with right foot wound.  Recently left AMA from the hospital.  Was seen by orthopedics (Dr. Julio Ohm) who is going to talk to him about right BKA but he left AMA.  Comes back with worsening infection. [RP]    Clinical Course User Index [RP] Ninetta Basket, MD     Patient with likely gangrene of his right lower extremity.  Labs are pending, x-rays pending.  COVID start Vanco and cefepime , send blood cultures.  Recommend admission.  Likely needs to be evaluated by surgery for possible  amputation.  Handoff Dr Efraim Grange pending remainder of workup.              Additional history obtained: -Additional history obtained from ems -External records from outside source obtained and reviewed including: Chart review including previous notes, labs, imaging, consultation notes including  Prior admission Prior labs    Lab Tests: -I ordered, reviewed, and interpreted labs.   The pertinent results include:   Labs Reviewed  CULTURE, BLOOD (ROUTINE X 2)  CULTURE, BLOOD (ROUTINE X 2)  CBC WITH DIFFERENTIAL/PLATELET  COMPREHENSIVE METABOLIC PANEL WITH GFR  ETHANOL  RAPID URINE DRUG SCREEN, HOSP PERFORMED  LACTIC ACID, PLASMA  LACTIC ACID, PLASMA  EKG   EKG Interpretation Date/Time:    Ventricular Rate:    PR Interval:    QRS Duration:    QT Interval:    QTC Calculation:   R Axis:      Text Interpretation:           Imaging Studies ordered: I ordered imaging studies including foot xr >> pending   Medicines ordered and prescription drug management: Meds ordered this encounter  Medications   vancomycin  (VANCOCIN ) IVPB 1000 mg/200 mL premix    Antibiotic Indication::   Cellulitis   ceFEPIme  (MAXIPIME ) 2 g in sodium chloride  0.9 % 100 mL IVPB    Antibiotic Indication::   Cellulitis    -I have reviewed the patients home medicines and have made adjustments as needed   Consultations Obtained: na   Cardiac Monitoring: Continuous pulse oximetry interpreted by myself, 100% on RA.    Social Determinants of Health:  Diagnosis or treatment significantly limited by social determinants of health: alcohol  use and homelessness   Reevaluation: After the interventions noted above, I reevaluated the patient and found that they have stayed the same  Co morbidities that complicate the patient evaluation  Past Medical History:  Diagnosis Date   Acute metabolic encephalopathy 11/14/2020   Alcohol  abuse with alcohol -induced mood disorder (HCC)  11/10/2019   Alcohol  withdrawal syndrome, with delirium (HCC) 11/06/2019   Alcoholism (HCC)    Anxiety    Depression    Influenza A 05/20/2018   Sepsis (HCC) 11/14/2020   Septic shock (HCC) 11/24/2020      Dispostion: Disposition decision including need for hospitalization was considered, and patient disposition pending at time of sign out.    Final Clinical Impression(s) / ED Diagnoses Final diagnoses:  Gangrene (HCC)        Teddi Favors, DO 08/19/23 1523

## 2023-08-19 NOTE — Hospital Course (Signed)
 Jake Nguyen is a 58 y.o. male with medical history significant for alcohol  use disorder, s/p left AKA, depression/anxiety, tobacco use, homelessness, and chronic right lower leg and foot infection who is admitted with gangrene of right lower extremity.

## 2023-08-19 NOTE — ED Triage Notes (Addendum)
 Patient arrives via [facility] for right foot wound infection. Patient has hx of diabetes. Alert and oriented. CBG 156. VSS, HR elevated at 114. BP 136/90. Patient is homeless, attempted to refuse transport. Patient has aka on right leg.

## 2023-08-19 NOTE — Plan of Care (Signed)
  Problem: Education: Goal: Knowledge of General Education information will improve Description: Including pain rating scale, medication(s)/side effects and non-pharmacologic comfort measures Outcome: Not Progressing   Problem: Health Behavior/Discharge Planning: Goal: Ability to manage health-related needs will improve Outcome: Not Progressing   Problem: Clinical Measurements: Goal: Ability to maintain clinical measurements within normal limits will improve Outcome: Not Progressing Goal: Will remain free from infection Outcome: Not Progressing Goal: Diagnostic test results will improve Outcome: Not Progressing Goal: Respiratory complications will improve Outcome: Not Progressing Goal: Cardiovascular complication will be avoided Outcome: Not Progressing   Problem: Activity: Goal: Risk for activity intolerance will decrease Outcome: Not Progressing   Problem: Nutrition: Goal: Adequate nutrition will be maintained Outcome: Not Progressing   Problem: Coping: Goal: Level of anxiety will decrease Outcome: Not Progressing   Problem: Elimination: Goal: Will not experience complications related to bowel motility Outcome: Not Progressing Goal: Will not experience complications related to urinary retention Outcome: Not Progressing   Problem: Pain Managment: Goal: General experience of comfort will improve and/or be controlled Outcome: Not Progressing   Problem: Safety: Goal: Ability to remain free from injury will improve Outcome: Not Progressing   Problem: Skin Integrity: Goal: Risk for impaired skin integrity will decrease Outcome: Not Progressing   Problem: Fluid Volume: Goal: Hemodynamic stability will improve Outcome: Not Progressing   Problem: Clinical Measurements: Goal: Diagnostic test results will improve Outcome: Not Progressing Goal: Signs and symptoms of infection will decrease Outcome: Not Progressing   Problem: Respiratory: Goal: Ability to maintain  adequate ventilation will improve Outcome: Not Progressing

## 2023-08-20 ENCOUNTER — Other Ambulatory Visit: Payer: Self-pay

## 2023-08-20 ENCOUNTER — Encounter (HOSPITAL_COMMUNITY)
Admission: EM | Disposition: A | Discharge status: Home / Self Care | Payer: Self-pay | Source: Home / Self Care | Attending: Internal Medicine

## 2023-08-20 ENCOUNTER — Encounter (HOSPITAL_COMMUNITY): Payer: Self-pay | Admitting: Internal Medicine

## 2023-08-20 ENCOUNTER — Inpatient Hospital Stay (HOSPITAL_COMMUNITY): Payer: MEDICAID

## 2023-08-20 DIAGNOSIS — I96 Gangrene, not elsewhere classified: Secondary | ICD-10-CM | POA: Diagnosis not present

## 2023-08-20 HISTORY — PX: APPLICATION OF WOUND VAC: SHX5189

## 2023-08-20 HISTORY — PX: AMPUTATION: SHX166

## 2023-08-20 LAB — CBC
HCT: 35.9 % — ABNORMAL LOW (ref 39.0–52.0)
Hemoglobin: 12 g/dL — ABNORMAL LOW (ref 13.0–17.0)
MCH: 29.5 pg (ref 26.0–34.0)
MCHC: 33.4 g/dL (ref 30.0–36.0)
MCV: 88.2 fL (ref 80.0–100.0)
Platelets: 169 10*3/uL (ref 150–400)
RBC: 4.07 MIL/uL — ABNORMAL LOW (ref 4.22–5.81)
RDW: 14.2 % (ref 11.5–15.5)
WBC: 6.2 10*3/uL (ref 4.0–10.5)
nRBC: 0 % (ref 0.0–0.2)

## 2023-08-20 LAB — MAGNESIUM: Magnesium: 1.7 mg/dL (ref 1.7–2.4)

## 2023-08-20 LAB — BASIC METABOLIC PANEL WITH GFR
Anion gap: 10 (ref 5–15)
BUN: <5 mg/dL — ABNORMAL LOW (ref 6–20)
CO2: 23 mmol/L (ref 22–32)
Calcium: 7.8 mg/dL — ABNORMAL LOW (ref 8.9–10.3)
Chloride: 100 mmol/L (ref 98–111)
Creatinine, Ser: 0.6 mg/dL — ABNORMAL LOW (ref 0.61–1.24)
GFR, Estimated: >60 mL/min (ref >60)
Glucose, Bld: 112 mg/dL — ABNORMAL HIGH (ref 70–99)
Potassium: 3.8 mmol/L (ref 3.5–5.1)
Sodium: 133 mmol/L — ABNORMAL LOW (ref 135–145)

## 2023-08-20 LAB — PROTIME-INR
INR: 1.1 (ref 0.8–1.2)
Prothrombin Time: 13.9 s (ref 11.4–15.2)

## 2023-08-20 LAB — TYPE AND SCREEN
ABO/RH(D): B POS
Antibody Screen: NEGATIVE

## 2023-08-20 LAB — C-REACTIVE PROTEIN: CRP: 0.9 mg/dL (ref <1.0)

## 2023-08-20 LAB — PROCALCITONIN: Procalcitonin: <0.1 ng/mL

## 2023-08-20 SURGERY — AMPUTATION BELOW KNEE
Anesthesia: General | Site: Knee | Laterality: Right

## 2023-08-20 MED ORDER — FENTANYL CITRATE PF 50 MCG/ML IJ SOSY
50.0000 ug | PREFILLED_SYRINGE | Freq: Once | INTRAMUSCULAR | Status: AC
  Administered 2023-08-20: 50 ug via INTRAVENOUS

## 2023-08-20 MED ORDER — CHLORHEXIDINE GLUCONATE 0.12 % MT SOLN: 15.0000 mL | Freq: Once | OROMUCOSAL | Status: AC

## 2023-08-20 MED ORDER — CHLORHEXIDINE GLUCONATE 0.12 % MT SOLN
OROMUCOSAL | Status: AC
  Administered 2023-08-20: 15 mL via OROMUCOSAL
  Filled 2023-08-20: qty 15

## 2023-08-20 MED ORDER — ADULT MULTIVITAMIN W/MINERALS CH: 1.0000 | ORAL_TABLET | Freq: Every day | ORAL | Status: DC

## 2023-08-20 MED ORDER — LACTATED RINGERS IV SOLN: INTRAVENOUS | Status: DC

## 2023-08-20 MED ORDER — ROCURONIUM BROMIDE 10 MG/ML (PF) SYRINGE
PREFILLED_SYRINGE | INTRAVENOUS | Status: AC
  Filled 2023-08-20: qty 10

## 2023-08-20 MED ORDER — DEXAMETHASONE SODIUM PHOSPHATE 10 MG/ML IJ SOLN
INTRAMUSCULAR | Status: DC | PRN
  Administered 2023-08-20: 5 mg via INTRAVENOUS

## 2023-08-20 MED ORDER — FENTANYL CITRATE (PF) 100 MCG/2ML IJ SOLN
INTRAMUSCULAR | Status: AC
  Filled 2023-08-20: qty 2

## 2023-08-20 MED ORDER — OXYCODONE HCL 5 MG PO TABS: 5.0000 mg | ORAL_TABLET | Freq: Once | ORAL | Status: DC | PRN

## 2023-08-20 MED ORDER — CEFAZOLIN SODIUM-DEXTROSE 2-4 GM/100ML-% IV SOLN
2.0000 g | Freq: Three times a day (TID) | INTRAVENOUS | Status: AC
  Administered 2023-08-20 (×2): 2 g via INTRAVENOUS
  Filled 2023-08-20 (×2): qty 100

## 2023-08-20 MED ORDER — MIDAZOLAM HCL 2 MG/2ML IJ SOLN
INTRAMUSCULAR | Status: AC
  Filled 2023-08-20: qty 2

## 2023-08-20 MED ORDER — CEFAZOLIN SODIUM-DEXTROSE 2-4 GM/100ML-% IV SOLN
2.0000 g | INTRAVENOUS | Status: AC
  Administered 2023-08-20: 2 g via INTRAVENOUS

## 2023-08-20 MED ORDER — PROPOFOL 10 MG/ML IV BOLUS
INTRAVENOUS | Status: DC | PRN
  Administered 2023-08-20: 50 mg via INTRAVENOUS
  Administered 2023-08-20: 120 mg via INTRAVENOUS

## 2023-08-20 MED ORDER — VITAMIN C 500 MG PO TABS
1000.0000 mg | ORAL_TABLET | Freq: Every day | ORAL | Status: DC
  Administered 2023-08-20 – 2023-08-24 (×5): 1000 mg via ORAL
  Filled 2023-08-20 (×5): qty 2

## 2023-08-20 MED ORDER — FENTANYL CITRATE (PF) 250 MCG/5ML IJ SOLN
INTRAMUSCULAR | Status: AC
  Filled 2023-08-20: qty 5

## 2023-08-20 MED ORDER — BUPIVACAINE HCL (PF) 0.25 % IJ SOLN
INTRAMUSCULAR | Status: DC | PRN
  Administered 2023-08-20: 20 mL via EPIDURAL
  Administered 2023-08-20: 25 mL via EPIDURAL

## 2023-08-20 MED ORDER — MIDAZOLAM HCL 2 MG/2ML IJ SOLN: 1.0000 mg | Freq: Once | INTRAMUSCULAR | Status: AC

## 2023-08-20 MED ORDER — 0.9 % SODIUM CHLORIDE (POUR BTL) OPTIME
TOPICAL | Status: DC | PRN
  Administered 2023-08-20: 1000 mL

## 2023-08-20 MED ORDER — DEXMEDETOMIDINE HCL IN NACL 80 MCG/20ML IV SOLN
INTRAVENOUS | Status: DC | PRN
  Administered 2023-08-20: 8 ug via INTRAVENOUS

## 2023-08-20 MED ORDER — OXYCODONE HCL 5 MG PO TABS
5.0000 mg | ORAL_TABLET | ORAL | Status: DC | PRN
  Administered 2023-08-20 – 2023-08-23 (×9): 10 mg via ORAL
  Administered 2023-08-24: 5 mg via ORAL
  Filled 2023-08-20 (×6): qty 2
  Filled 2023-08-20: qty 1
  Filled 2023-08-20 (×5): qty 2

## 2023-08-20 MED ORDER — DEXAMETHASONE SODIUM PHOSPHATE 10 MG/ML IJ SOLN
INTRAMUSCULAR | Status: AC
  Filled 2023-08-20: qty 1

## 2023-08-20 MED ORDER — HYDROMORPHONE HCL 1 MG/ML IJ SOLN: 0.5000 mg | INTRAMUSCULAR | Status: DC | PRN

## 2023-08-20 MED ORDER — OXYCODONE HCL 5 MG PO TABS
10.0000 mg | ORAL_TABLET | ORAL | Status: DC | PRN
  Administered 2023-08-20: 10 mg via ORAL
  Administered 2023-08-21: 15 mg via ORAL
  Filled 2023-08-20: qty 3

## 2023-08-20 MED ORDER — ACETAMINOPHEN 10 MG/ML IV SOLN: 1000.0000 mg | Freq: Once | INTRAVENOUS | Status: DC | PRN

## 2023-08-20 MED ORDER — ACETAMINOPHEN 325 MG PO TABS: 325.0000 mg | ORAL_TABLET | Freq: Four times a day (QID) | ORAL | Status: DC | PRN

## 2023-08-20 MED ORDER — ATORVASTATIN CALCIUM 40 MG PO TABS
40.0000 mg | ORAL_TABLET | Freq: Every day | ORAL | Status: DC
  Administered 2023-08-20 – 2023-08-24 (×5): 40 mg via ORAL
  Filled 2023-08-20 (×5): qty 1

## 2023-08-20 MED ORDER — FENTANYL CITRATE (PF) 250 MCG/5ML IJ SOLN
INTRAMUSCULAR | Status: DC | PRN
Start: 2023-08-20 — End: 2023-08-20
  Administered 2023-08-20 (×4): 50 ug via INTRAVENOUS

## 2023-08-20 MED ORDER — FENTANYL CITRATE (PF) 100 MCG/2ML IJ SOLN: 25.0000 ug | INTRAMUSCULAR | Status: DC | PRN

## 2023-08-20 MED ORDER — LIDOCAINE 2% (20 MG/ML) 5 ML SYRINGE
INTRAMUSCULAR | Status: AC
  Filled 2023-08-20: qty 5

## 2023-08-20 MED ORDER — POVIDONE-IODINE 10 % EX SWAB
2.0000 | Freq: Once | CUTANEOUS | Status: AC
  Administered 2023-08-20: 2 via TOPICAL

## 2023-08-20 MED ORDER — JUVEN PO PACK
1.0000 | PACK | Freq: Two times a day (BID) | ORAL | Status: DC
  Administered 2023-08-20 – 2023-08-24 (×8): 1 via ORAL
  Filled 2023-08-20 (×8): qty 1

## 2023-08-20 MED ORDER — MIDAZOLAM HCL 2 MG/2ML IJ SOLN
INTRAMUSCULAR | Status: AC
  Administered 2023-08-20: 1 mg via INTRAVENOUS
  Filled 2023-08-20: qty 2

## 2023-08-20 MED ORDER — CHLORHEXIDINE GLUCONATE 4 % EX SOLN
60.0000 mL | Freq: Once | CUTANEOUS | Status: AC
  Administered 2023-08-20: 4 via TOPICAL

## 2023-08-20 MED ORDER — CEFAZOLIN SODIUM-DEXTROSE 2-4 GM/100ML-% IV SOLN
INTRAVENOUS | Status: AC
  Filled 2023-08-20: qty 100

## 2023-08-20 MED ORDER — ORAL CARE MOUTH RINSE: 15.0000 mL | Freq: Once | OROMUCOSAL | Status: AC

## 2023-08-20 MED ORDER — ONDANSETRON HCL 4 MG/2ML IJ SOLN
INTRAMUSCULAR | Status: AC
  Filled 2023-08-20: qty 2

## 2023-08-20 MED ORDER — PROPOFOL 10 MG/ML IV BOLUS
INTRAVENOUS | Status: AC
  Filled 2023-08-20: qty 20

## 2023-08-20 MED ORDER — OXYCODONE HCL 5 MG/5ML PO SOLN: 5.0000 mg | Freq: Once | ORAL | Status: DC | PRN

## 2023-08-20 MED ORDER — ZINC SULFATE 220 (50 ZN) MG PO CAPS
220.0000 mg | ORAL_CAPSULE | Freq: Every day | ORAL | Status: DC
  Administered 2023-08-20 – 2023-08-24 (×5): 220 mg via ORAL
  Filled 2023-08-20 (×5): qty 1

## 2023-08-20 MED ORDER — DROPERIDOL 2.5 MG/ML IJ SOLN: 0.6250 mg | Freq: Once | INTRAMUSCULAR | Status: DC | PRN

## 2023-08-20 MED ORDER — SODIUM CHLORIDE 0.9 % IV SOLN: INTRAVENOUS | Status: DC

## 2023-08-20 MED ORDER — PANTOPRAZOLE SODIUM 40 MG PO TBEC
40.0000 mg | DELAYED_RELEASE_TABLET | Freq: Every day | ORAL | Status: DC
  Administered 2023-08-20 – 2023-08-24 (×5): 40 mg via ORAL
  Filled 2023-08-20 (×5): qty 1

## 2023-08-20 MED ORDER — LACTATED RINGERS IV SOLN: INTRAVENOUS | Status: AC

## 2023-08-20 MED ORDER — SODIUM CHLORIDE 0.9 % IV SOLN
2.0000 g | Freq: Three times a day (TID) | INTRAVENOUS | Status: DC
  Administered 2023-08-20 (×2): 2 g via INTRAVENOUS
  Administered 2023-08-20: 1 g via INTRAVENOUS
  Administered 2023-08-21 (×2): 2 g via INTRAVENOUS
  Filled 2023-08-20 (×6): qty 12.5

## 2023-08-20 MED ORDER — VASHE WOUND IRRIGATION OPTIME
TOPICAL | Status: DC | PRN
  Administered 2023-08-20: 34 [oz_av]

## 2023-08-20 MED ORDER — ONDANSETRON HCL 4 MG/2ML IJ SOLN
INTRAMUSCULAR | Status: DC | PRN
  Administered 2023-08-20: 4 mg via INTRAVENOUS

## 2023-08-20 MED ORDER — HEPARIN SODIUM (PORCINE) 5000 UNIT/ML IJ SOLN
5000.0000 [IU] | Freq: Three times a day (TID) | INTRAMUSCULAR | Status: DC
  Administered 2023-08-20 – 2023-08-24 (×11): 5000 [IU] via SUBCUTANEOUS
  Filled 2023-08-20 (×12): qty 1

## 2023-08-20 MED ORDER — FOLIC ACID 1 MG PO TABS: 1.0000 mg | ORAL_TABLET | Freq: Every day | ORAL | Status: DC

## 2023-08-20 SURGICAL SUPPLY — 32 items
BLADE SAW RECIP 87.9 MT (BLADE) ×3 IMPLANT
BLADE SURG 21 STRL SS (BLADE) ×3 IMPLANT
CANISTER WOUND CARE 500ML ATS (WOUND CARE) ×3 IMPLANT
CLEANSER WND VASHE INSTL 34OZ (WOUND CARE) IMPLANT
COVER SURGICAL LIGHT HANDLE (MISCELLANEOUS) ×3 IMPLANT
CUFF TRNQT CYL 34X4.125X (TOURNIQUET CUFF) ×3 IMPLANT
DRAPE DERMATAC (DRAPES) IMPLANT
DRAPE INCISE IOBAN 66X45 STRL (DRAPES) ×3 IMPLANT
DRAPE U-SHAPE 47X51 STRL (DRAPES) ×3 IMPLANT
DRESSING PREVENA PLUS CUSTOM (GAUZE/BANDAGES/DRESSINGS) ×3 IMPLANT
DURAPREP 26ML APPLICATOR (WOUND CARE) ×3 IMPLANT
ELECTRODE REM PT RTRN 9FT ADLT (ELECTROSURGICAL) ×3 IMPLANT
GLOVE BIOGEL PI IND STRL 9 (GLOVE) ×3 IMPLANT
GLOVE SURG ORTHO 9.0 STRL STRW (GLOVE) ×3 IMPLANT
GOWN STRL REUS W/ TWL XL LVL3 (GOWN DISPOSABLE) ×6 IMPLANT
GRAFT SKIN WND MICRO 38 (Tissue) IMPLANT
KIT BASIN OR (CUSTOM PROCEDURE TRAY) ×3 IMPLANT
KIT TURNOVER KIT B (KITS) ×3 IMPLANT
MANIFOLD NEPTUNE II (INSTRUMENTS) ×3 IMPLANT
NS IRRIG 1000ML POUR BTL (IV SOLUTION) ×3 IMPLANT
PACK ORTHO EXTREMITY (CUSTOM PROCEDURE TRAY) ×3 IMPLANT
PAD ARMBOARD POSITIONER FOAM (MISCELLANEOUS) ×3 IMPLANT
PREVENA RESTOR ARTHOFORM 46X30 (CANNISTER) ×3 IMPLANT
SPONGE T-LAP 18X18 ~~LOC~~+RFID (SPONGE) IMPLANT
STAPLER VISISTAT 35W (STAPLE) IMPLANT
STOCKINETTE IMPERVIOUS LG (DRAPES) ×3 IMPLANT
SUT ETHILON 2 0 PSLX (SUTURE) IMPLANT
SUT SILK 2-0 18XBRD TIE 12 (SUTURE) ×3 IMPLANT
SUT VIC AB 1 CTX 27 (SUTURE) ×6 IMPLANT
TOWEL GREEN STERILE (TOWEL DISPOSABLE) ×3 IMPLANT
TUBE CONNECTING 12X1/4 (SUCTIONS) ×3 IMPLANT
YANKAUER SUCT BULB TIP NO VENT (SUCTIONS) ×3 IMPLANT

## 2023-08-20 NOTE — Anesthesia Procedure Notes (Signed)
 Anesthesia Regional Block: Popliteal block   Pre-Anesthetic Checklist: , timeout performed,  Correct Patient, Correct Site, Correct Laterality,  Correct Procedure, Correct Position, site marked,  Risks and benefits discussed,  Surgical consent,  Pre-op evaluation,  At surgeon's request and post-op pain management  Laterality: Right  Prep: chloraprep       Needles:  Injection technique: Single-shot  Needle Type: Echogenic Stimulator Needle     Needle Length: 9cm  Needle Gauge: 21     Additional Needles:   Procedures:,,,, ultrasound used (permanent image in chart),,    Narrative:  Start time: 08/20/2023 9:05 AM End time: 08/20/2023 9:10 AM Injection made incrementally with aspirations every 5 mL.  Performed by: Personally  Anesthesiologist: Lethaniel Rave, MD  Additional Notes: Discussed risks and benefits of the nerve block in detail, including but not limited vascular injury, permanent nerve damage and infection.   Patient tolerated the procedure well. Local anesthetic introduced in an incremental fashion under minimal resistance after negative aspirations. No paresthesias were elicited. After completion of the procedure, no acute issues were identified and patient continued to be monitored by RN.

## 2023-08-20 NOTE — Op Note (Signed)
 08/20/2023  10:20 AM  PATIENT:  Jake Nguyen    PRE-OPERATIVE DIAGNOSIS:  gangrene  POST-OPERATIVE DIAGNOSIS:  Same  PROCEDURE:  AMPUTATION BELOW KNEE Application of Kerecis micro graft 38 cm  Application of Prevena customizable and Prevena arthroform wound VAC dressings Application of Vive Wear stump shrinker  SURGEON:  Timothy Ford, MD  ANESTHESIA:   General  PREOPERATIVE INDICATIONS:  [name] is a  58 y.o. male with a diagnosis of gangrene who failed conservative measures and elected for surgical management.    The risks benefits and alternatives were discussed with the patient preoperatively including but not limited to the risks of infection, bleeding, nerve injury, cardiopulmonary complications, the need for revision surgery, among others, and the patient was willing to proceed.  OPERATIVE IMPLANTS:   Implant Name Type Inv. Item Serial No. Manufacturer Lot No. LRB No. Used Action  GRAFT SKIN WND MICRO 38 - [id number] Tissue GRAFT SKIN WND MICRO 38  [employer] [id number] Right 1 Implanted     OPERATIVE FINDINGS: Muscle had good petechial bleeding there was clear tissue margins.  OPERATIVE PROCEDURE: Patient was brought to the operating room after undergoing a regional anesthetic.  After adequate levels anesthesia were obtained a thigh tourniquet was placed and the lower extremity was prepped using DuraPrep draped into a sterile field. The foot was draped out of the sterile field with impervious stockinette.  A timeout was called and the tourniquet inflated.  A transverse skin incision was made 12 cm distal to the tibial tubercle, the incision curved proximally, and a large posterior flap was created.  The tibia was transected just proximal to the skin incision and beveled anteriorly.  The fibula was transected just proximal to the tibial incision.  The sciatic nerve was pulled cut and allowed to retract.  The vascular bundles were suture ligated with 2-0 silk.   The tourniquet was deflated and hemostasis obtained.       The Kerecis micro powder 38 cm was applied to the open wound that has a 200 cm surface area.   The deep and superficial fascial layers were closed using #1 Vicryl.  The skin was closed using staples.    The Prevena customizable dressing was applied this was overwrapped with the arthroform sponge.  Yvone Herd was used to secure the sponges and the circumferential compression was secured to the skin with Dermatac.  This was connected to the wound VAC pump and had a good suction fit this was covered with a stump shrinker.  Patient was taken to the PACU in stable condition.   DISCHARGE PLANNING:  Antibiotic duration: 24-hour antibiotics  Weightbearing: Nonweightbearing on the operative extremity  Pain medication: Opioid pathway  Dressing care/ Wound VAC: Continue wound VAC with the Prevena plus pump at discharge for 1 week  Ambulatory devices: Walker or kneeling scooter  Discharge to: Discharge planning based on recommendations per physical therapy  Follow-up: In the office 1 week after discharge.

## 2023-08-20 NOTE — Consult Note (Signed)
 ORTHOPAEDIC CONSULTATION  REQUESTING PHYSICIAN: Cala Castleman, MD  Chief Complaint: Back its right foot.  HPI: Jake Nguyen is a 58 y.o. male who presents with gangrene right foot with maggots.  Patient previously been hospitalized with similar problems with the right foot and left AMA.  Patient is status post endovascular revascularization with vascular vein surgery several years ago.  Patient is status post left above-knee amputation.  Past Medical History:  Diagnosis Date   Acute metabolic encephalopathy 11/14/2020   Alcohol  abuse with alcohol -induced mood disorder (HCC) 11/10/2019   Alcohol  withdrawal syndrome, with delirium (HCC) 11/06/2019   Alcoholism (HCC)    Anxiety    Depression    Influenza A 05/20/2018   Sepsis (HCC) 11/14/2020   Septic shock (HCC) 11/24/2020   Past Surgical History:  Procedure Laterality Date   ABDOMINAL AORTOGRAM W/LOWER EXTREMITY N/A 08/04/2020   Procedure: ABDOMINAL AORTOGRAM W/LOWER EXTREMITY;  Surgeon: Margherita Shell, MD;  Location: [facility] IN[facility];  Service: Cardiovascular;  Laterality: N/A;   AMPUTATION Left 08/05/2020   Procedure: AMPUTATION BELOW KNEE;  Surgeon: Margherita Shell, MD;  Location: [facility] OR;  Service: Vascular;  Laterality: Left;   AMPUTATION Left 11/21/2020   Procedure: REVISION LEFT ABOVE KNEE AMPUTATION;  Surgeon: Margherita Shell, MD;  Location: [facility] OR;  Service: Vascular;  Laterality: Left;   Social History   Socioeconomic History   Marital status: Single    Spouse name: Not on file   Number of children: Not on file   Years of education: Not on file   Highest education level: Not on file  Occupational History   Not on file  Tobacco Use   Smoking status: Every Day    Current packs/day: 0.50    Types: Cigarettes   Smokeless tobacco: Never  Substance and Sexual Activity   Alcohol  use: Yes    Comment: 6 pack perday   Drug use: Yes    Types: Marijuana   Sexual activity: Not Currently  Other Topics  Concern   Not on file  Social History Narrative   Not on file   Social Drivers of Health   Financial Resource Strain: Not on file  Food Insecurity: Food Insecurity Present (08/19/2023)   Hunger Vital Sign    Worried About Running Out of Food in the Last Year: Sometimes true    Ran Out of Food in the Last Year: Often true  Transportation Needs: Unmet Transportation Needs (08/19/2023)   PRAPARE - Administrator, Civil Service (Medical): Yes    Lack of Transportation (Non-Medical): Yes  Physical Activity: Not on file  Stress: Not on file  Social Connections: Socially Isolated (07/20/2023)   Social Connection and Isolation Panel [NHANES]    Frequency of Communication with Friends and Family: Never    Frequency of Social Gatherings with Friends and Family: Never    Attends Religious Services: Never    Database administrator or Organizations: No    Attends Engineer, structural: Never    Marital Status: Never married   Family History  Family history unknown: Yes   - negative except otherwise stated in the family history section Allergies  Allergen Reactions   Pork-Derived Products Other (See Comments)    Pt states he doesn't eat pork   Prior to Admission medications   Medication Sig Start Date End Date Taking? Authorizing Provider  atorvastatin  (LIPITOR) 40 MG tablet Take 1 tablet (40 mg total) by mouth daily. Patient not  taking: Reported on 11/19/2020 08/27/20   Lovell Rubenstein, NP  folic acid  (FOLVITE ) 1 MG tablet Take 1 tablet (1 mg total) by mouth daily. Patient not taking: Reported on 11/19/2020 08/27/20   Lovell Rubenstein, NP  Multiple Vitamin (MULTIVITAMIN WITH MINERALS) TABS tablet Take 1 tablet by mouth daily. Patient not taking: Reported on 03/19/2021 11/27/20   Verlyn Goad, MD   DG Foot 2 Views Right Result Date: 08/19/2023 CLINICAL DATA:  Right foot wound. EXAM: RIGHT FOOT - 2 VIEW COMPARISON:  01/19/2024 FINDINGS: Diffuse subcutaneous soft tissue  swelling is noted but no gas is seen in the soft tissues. The bony structures are intact. No fracture or destructive bony changes to suggest osteomyelitis. No significant arthropathic findings. IMPRESSION: 1. Diffuse subcutaneous soft tissue swelling but no gas in the soft tissues. 2. No plain film findings for osteomyelitis. Electronically Signed   By: Marrian Siva M.D.   On: 08/19/2023 17:38   - pertinent xrays, CT, MRI studies were reviewed and independently interpreted  Positive ROS: All other systems have been reviewed and were otherwise negative with the exception of those mentioned in the HPI and as above.  Physical Exam: General: Alert, no acute distress Psychiatric: Patient is competent for consent with normal mood and affect Lymphatic: No axillary or cervical lymphadenopathy Cardiovascular: No pedal edema Respiratory: No cyanosis, no use of accessory musculature GI: No organomegaly, abdomen is soft and non-tender    Images:  @ENCIMAGES @  Labs:  Lab Results  Component Value Date   HGBA1C 5.6 07/20/2023   HGBA1C 6.6 (H) 07/31/2020   HGBA1C 6.9 (H) 05/23/2018   ESRSEDRATE 2 07/20/2023   CRP 0.9 08/20/2023   CRP 1.1 (H) 07/20/2023   REPTSTATUS 07/25/2023 FINAL 07/20/2023   CULT  07/20/2023    NO GROWTH 5 DAYS Performed at [facility], [address], [place], [place] [address]    LABORGA ACINETOBACTER CALCOACETICUS/BAUMANNII COMPLEX 05/20/2018    Lab Results  Component Value Date   ALBUMIN 2.9 (L) 08/19/2023   ALBUMIN 3.0 (L) 07/22/2023   ALBUMIN 3.2 (L) 07/21/2023        Latest Ref Rng & Units 08/20/2023    6:31 AM 08/19/2023    3:47 PM 07/22/2023    3:48 AM  CBC EXTENDED  WBC 4.0 - 10.5 K/uL 6.2  7.0  5.1   RBC 4.22 - 5.81 MIL/uL 4.07  4.79  4.34   Hemoglobin 13.0 - 17.0 g/dL 60.4  54.0  98.1   HCT 39.0 - 52.0 % 35.9  43.8  38.0   Platelets 150 - 400 K/uL 169  217  88   NEUT# 1.7 - 7.7 K/uL  3.3  3.0   Lymph# 0.7 - 4.0 K/uL  2.3  1.4      Neurologic: Patient does not have protective sensation bilateral lower extremities.   MUSCULOSKELETAL:   Skin: Examination patient has active maggots on his foot.  Radiographs show soft tissue swelling but no gas.  No destructive bony changes.  Hemoglobin 12.0 with a white cell count of 6.2.  Albumin 2.9.  Hemoglobin A1c currently 5.6.  Assessment: Assessment: Gangrene right foot.  Plan: Plan: Will plan for right below-knee amputation at this time.  Risk and benefits were discussed including patient being at high risk for wound dehiscence and potential for higher level amputation.  Patient states he understands wished to proceed at this time.  Thank you for the consult and the opportunity to see Mr. Heinrich Gerdes  Julio Ohm, MD [facility] [phone] 8:19 AM

## 2023-08-20 NOTE — Anesthesia Procedure Notes (Signed)
 Procedure Name: LMA Insertion Date/Time: 08/20/2023 9:30 AM  Performed by: Jettie Lazare J, CRNAPre-anesthesia Checklist: Patient identified, Emergency Drugs available, Suction available and Patient being monitored Patient Re-evaluated:Patient Re-evaluated prior to induction Oxygen Delivery Method: Circle System Utilized Preoxygenation: Pre-oxygenation with 100% oxygen Induction Type: IV induction Ventilation: Mask ventilation without difficulty LMA: LMA inserted LMA Size: 4.0 Number of attempts: 1 Airway Equipment and Method: Bite block Placement Confirmation: positive ETCO2 Tube secured with: Tape Dental Injury: Teeth and Oropharynx as per pre-operative assessment

## 2023-08-20 NOTE — Progress Notes (Signed)
 PT Cancellation Note  Patient Details Name: REASE ROWLISON MRN: [id number] DOB: [birth year]   Cancelled Treatment:    Reason Eval/Treat Not Completed: Patient at procedure or test/unavailable; plan for R BKA today. Will follow up for PT Evaluation post-op.  Lai Hendriks, PT, DPT Acute Rehabilitation Services  Personal: [facility] Office: [phone]  Youssef Footman L Alima Naser 08/20/2023, 8:35 AM

## 2023-08-20 NOTE — Anesthesia Postprocedure Evaluation (Signed)
 Anesthesia Post Note  Patient: Jake Nguyen  Procedure(s) Performed: AMPUTATION BELOW KNEE (Right: Knee) APPLICATION, WOUND VAC (Right)     Patient location during evaluation: PACU Anesthesia Type: General and Regional Level of consciousness: awake and alert Pain management: pain level controlled Vital Signs Assessment: post-procedure vital signs reviewed and stable Respiratory status: spontaneous breathing, nonlabored ventilation, respiratory function stable and patient connected to nasal cannula oxygen Cardiovascular status: blood pressure returned to baseline and stable Postop Assessment: no apparent nausea or vomiting Anesthetic complications: no   No notable events documented.  Last Vitals:  Vitals:   08/20/23 1045 08/20/23 1100  BP: (!) 131/91 122/81  Pulse: 98 92  Resp: 14 14  Temp:  36.8 C  SpO2: 93% 94%    Last Pain:  Vitals:   08/20/23 1100  TempSrc: Oral  PainSc: 0-No pain                 Lethaniel Rave

## 2023-08-20 NOTE — Anesthesia Preprocedure Evaluation (Signed)
 Anesthesia Evaluation  Patient identified by MRN, date of birth, ID band Patient awake    Reviewed: Allergy & Precautions, NPO status , Patient's Chart, lab work & pertinent test results  History of Anesthesia Complications Negative for: history of anesthetic complications  Airway Mallampati: II  TM Distance: >3 FB Neck ROM: Full    Dental  (+) Dental Advisory Given, Missing   Pulmonary Current Smoker   Pulmonary exam normal        Cardiovascular negative cardio ROS Normal cardiovascular exam     Neuro/Psych  PSYCHIATRIC DISORDERS Anxiety Depression     Neuromuscular disease    GI/Hepatic negative GI ROS,,,(+)     substance abuse  alcohol  use and marijuana use  Endo/Other  negative endocrine ROS    Renal/GU negative Renal ROS     Musculoskeletal negative musculoskeletal ROS (+)  Right foot infection   Abdominal   Peds  Hematology negative hematology ROS (+)   Anesthesia Other Findings S/p left BKA   Reproductive/Obstetrics                              Anesthesia Physical Anesthesia Plan  ASA: 3  Anesthesia Plan: General   Post-op Pain Management: Regional block*   Induction: Intravenous  PONV Risk Score and Plan: 3 and Treatment may vary due to age or medical condition, Ondansetron , Midazolam  and Dexamethasone   Airway Management Planned: LMA  Additional Equipment: None  Intra-op Plan:   Post-operative Plan: Extubation in OR  Informed Consent: I have reviewed the patients History and Physical, chart, labs and discussed the procedure including the risks, benefits and alternatives for the proposed anesthesia with the patient or authorized representative who has indicated his/her understanding and acceptance.     Dental advisory given  Plan Discussed with: CRNA and Anesthesiologist  Anesthesia Plan Comments: ( 58 y.o. male with medical history significant for alcohol   use disorder, s/p left AKA, depression/anxiety, tobacco use, homelessness, and chronic right lower leg and foot infection)         Anesthesia Quick Evaluation

## 2023-08-20 NOTE — Transfer of Care (Signed)
 Immediate Anesthesia Transfer of Care Note  Patient: Jake Nguyen  Procedure(s) Performed: AMPUTATION BELOW KNEE (Right: Knee)  Patient Location: PACU  Anesthesia Type:General  Level of Consciousness: drowsy  Airway & Oxygen Therapy: Patient connected to face mask oxygen  Post-op Assessment: Report given to RN and Post -op Vital signs reviewed and stable  Post vital signs: Reviewed and stable  Last Vitals:  Vitals Value Taken Time  BP 119/83 08/20/23 1030  Temp    Pulse 100 08/20/23 1034  Resp 16 08/20/23 1034  SpO2 97 % 08/20/23 1034  Vitals shown include unfiled device data.  Last Pain:  Vitals:   08/20/23 0900  TempSrc:   PainSc: 0-No pain         Complications: No notable events documented.

## 2023-08-20 NOTE — Progress Notes (Signed)
 PROGRESS NOTE                                                                                                                                                                                                             Patient Demographics:    Jake Nguyen, is a 58 y.o. male, DOB - 1965-09-20, ZOX:[id number]  Outpatient Primary MD for the patient is Trenton Frock, FNP    LOS - 1  Admit date - 08/19/2023    Chief Complaint  Patient presents with   Wound Infection       Brief Narrative (HPI from H&P)   59 y.o. male with medical history significant for alcohol  use disorder, s/p left AKA, depression/anxiety, tobacco use, homelessness, and chronic right lower leg and foot infection who presented to the ED for evaluation of worsening pain and infection of right lower extremity.   Patient has prior history of left AKA due to infection.  He was last admitted on 4/10 for worsening of right lower extremity wound infection.  Case was discussed with Dr. Julio Ohm who was going to see patient to discuss possible right BKA however patient ultimately left AGAINST MEDICAL ADVICE on 4/14.  Patient returns with worsening pain, swelling, infectious appearance, and odor of his right foot.  His leg was covered in maggots on arrival.   He reports daily alcohol  use, maybe a 12 pack of beer daily.  He also reports smoking up to 1 pack of cigarettes daily.  He says he is living on the streets.  Further workup In the ER was suggestive of right foot gangrene and he was admitted for further care.     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  Right leg covered in maggots on arrival.  Was recommended BKA when admitted last  month but he left AMA. Continue broad-spectrum antibiotics with IV vancomycin , cefepime , Flagyl , Follow blood cultures, Dr Julio Ohm to see.   Alcohol  use disorder with intoxication: Patient reports drinking around 12 beers daily.  He is intoxicated with serum ethanol 312 on admission.  High risk for withdrawal.  Placed on CIWA protocol with Ativan  as needed, Continue thiamine , folate, MVM -  Librium  taper   Hypotension:  Borderline hypotensive on arrival - dehydration.  Received IV fluid bolus with improvement.  Continue IV fluid hydration for today, better   Hypokalemia:  Supplementing.   Depression/anxiety:  Not taking any medications.  Gently stable, not suicidal or homicidal.   Tobacco use:  Patient reports smoking about 1 pack/day.  He declines nicotine  patch.  Counseled to quit.   Homelessness: TOC consult.      Condition -   Guarded  Family Communication  :  None  Code Status :  Ful  Consults  :  Dr Julio Ohm  PUD Prophylaxis :  PPI   Procedures  :            Disposition Plan  :    Status is: Inpatient   DVT Prophylaxis  :    SCDs Start: 08/19/23 1925    Lab Results  Component Value Date   PLT 169 08/20/2023    Diet :  Diet Order             Diet NPO time specified  Diet effective midnight                    Inpatient Medications  Scheduled Meds:  chlordiazePOXIDE   25 mg Oral QID   Followed by   Cecily Cohen ON 08/21/2023] chlordiazePOXIDE   25 mg Oral TID   Followed by   Cecily Cohen ON 08/22/2023] chlordiazePOXIDE   25 mg Oral BH-qamhs   Followed by   Cecily Cohen ON 08/23/2023] chlordiazePOXIDE   25 mg Oral Daily   folic acid   1 mg Oral Daily   multivitamin with minerals  1 tablet Oral Daily   sodium chloride  flush  3 mL Intravenous Q12H   thiamine   100 mg Oral Daily   Or   thiamine   100 mg Intravenous Daily   Continuous Infusions:  ceFEPime  (MAXIPIME ) IV 1 g (08/19/23 2214)   metronidazole  500 mg (08/20/23 0321)   vancomycin  1,000 mg (08/20/23 0549)   PRN  Meds:.acetaminophen  **OR** acetaminophen , loperamide , LORazepam  **OR** LORazepam , ondansetron  **OR** ondansetron  (ZOFRAN ) IV, senna-docusate  Antibiotics  :    Anti-infectives (From admission, onward)    Start     Dose/Rate Route Frequency Ordered Stop   08/20/23 0530  vancomycin  (VANCOCIN ) IVPB 1000 mg/200 mL premix        1,000 mg 200 mL/hr over 60 Minutes Intravenous Every 12 hours 08/19/23 2015     08/20/23 0000  ceFEPIme  (MAXIPIME ) 1 g in sodium chloride  0.9 % 100 mL IVPB        1 g 200 mL/hr over 30 Minutes Intravenous Every 8 hours 08/19/23 2015     08/19/23 1600  metroNIDAZOLE  (FLAGYL ) IVPB 500 mg        500 mg 100 mL/hr over 60 Minutes Intravenous Every 12 hours 08/19/23 1549 08/26/23 1559   08/19/23 1500  vancomycin  (VANCOCIN ) IVPB 1000 mg/200 mL premix        1,000 mg 200 mL/hr over 60 Minutes Intravenous  Once 08/19/23 1446 08/19/23 1840   08/19/23 1500  ceFEPIme  (MAXIPIME ) 2 g in sodium chloride  0.9 % 100 mL IVPB        2 g 200 mL/hr over 30 Minutes Intravenous  Once 08/19/23 1446 08/19/23 1632         Objective:   Vitals:   08/20/23 0302 08/20/23 0306 08/20/23 0400 08/20/23 0600  BP:  125/61 120/73   Pulse:  Jake Nguyen)  107 87   Resp:  18 15   Temp:  98.5 F (36.9 C)    TempSrc:  Oral    SpO2: (!) 89% 91% (!) 89%   Weight:    63.8 kg  Height:        Wt Readings from Last 3 Encounters:  08/20/23 63.8 kg  01/23/23 63 kg  03/26/21 63.1 kg     Intake/Output Summary (Last 24 hours) at 08/20/2023 0736 Last data filed at 08/20/2023 0600 Gross per 24 hour  Intake 3804.46 ml  Output 1600 ml  Net 2204.46 ml     Physical Exam  Awake Alert, No new F.N deficits, Normal affect [place].AT,PERRAL Supple Neck, No JVD,   Symmetrical Chest wall movement, Good air movement bilaterally, CTAB RRR,No Gallops,Rubs or new Murmurs,  +ve B.Sounds, Abd Soft, No tenderness,   L BKA, L foot under bandage  L foot admit pictures           RN pressure injury  documentation: Pressure Injury 11/25/20 Sacrum Mid;Upper Stage 2 -  Partial thickness loss of dermis presenting as a shallow open injury with a red, pink wound bed without slough. area of broken skin between buttocks just below sacrum. (Active)  11/25/20 0806  Location: Sacrum  Location Orientation: Mid;Upper  Staging: Stage 2 -  Partial thickness loss of dermis presenting as a shallow open injury with a red, pink wound bed without slough.  Wound Description (Comments): area of broken skin between buttocks just below sacrum.  Present on Admission:      Pressure Injury 03/19/21 Buttocks Left Stage 2 -  Partial thickness loss of dermis presenting as a shallow open injury with a red, pink wound bed without slough. pink red (Active)  03/19/21 2300  Location: Buttocks  Location Orientation: Left  Staging: Stage 2 -  Partial thickness loss of dermis presenting as a shallow open injury with a red, pink wound bed without slough.  Wound Description (Comments): pink red  Present on Admission: Yes     Pressure Injury 03/19/21 Buttocks Right Stage 2 -  Partial thickness loss of dermis presenting as a shallow open injury with a red, pink wound bed without slough. red, pink (Active)  03/19/21 2300  Location: Buttocks  Location Orientation: Right  Staging: Stage 2 -  Partial thickness loss of dermis presenting as a shallow open injury with a red, pink wound bed without slough.  Wound Description (Comments): red, pink  Present on Admission: Yes     Pressure Injury 07/21/23 Buttocks Left Stage 2 -  Partial thickness loss of dermis presenting as a shallow open injury with a red, pink wound bed without slough. area very dry, flaking.  Small open area (Active)  07/21/23 1000  Location: Buttocks  Location Orientation: Left  Staging: Stage 2 -  Partial thickness loss of dermis presenting as a shallow open injury with a red, pink wound bed without slough.  Wound Description (Comments): area very dry, flaking.   Small open area  Present on Admission: Yes (pt states it is not new)  Dressing Type None 08/20/23 0400     Pressure Injury 07/21/23 Buttocks Right Stage 2 -  Partial thickness loss of dermis presenting as a shallow open injury with a red, pink wound bed without slough. area very dry, flaky.  Small area where skin had flaked off (Active)  07/21/23 1000  Location: Buttocks  Location Orientation: Right  Staging: Stage 2 -  Partial thickness loss of dermis presenting as a shallow  open injury with a red, pink wound bed without slough.  Wound Description (Comments): area very dry, flaky.  Small area where skin had flaked off  Present on Admission: Yes (pt states it is not new)  Dressing Type None 08/20/23 0400      Data Review:    Recent Labs  Lab 08/19/23 1547 08/20/23 0631  WBC 7.0 6.2  HGB 14.2 12.0*  HCT 43.8 35.9*  PLT 217 169  MCV 91.4 88.2  MCH 29.6 29.5  MCHC 32.4 33.4  RDW 14.5 14.2  LYMPHSABS 2.3  --   MONOABS 0.9  --   EOSABS 0.3  --   BASOSABS 0.1  --     Recent Labs  Lab 08/19/23 1547 08/19/23 1605 08/20/23 0631  NA 138  --   --   K 3.4*  --   --   CL 100  --   --   CO2 24  --   --   ANIONGAP 14  --   --   GLUCOSE 105*  --   --   BUN <5*  --   --   CREATININE 0.58*  --   --   AST 35  --   --   ALT 15  --   --   ALKPHOS 58  --   --   BILITOT 0.3  --   --   ALBUMIN 2.9*  --   --   LATICACIDVEN  --  3.1*  --   INR  --   --  1.1  CALCIUM  8.1*  --   --       Recent Labs  Lab 08/19/23 1547 08/19/23 1605 08/20/23 0631  LATICACIDVEN  --  3.1*  --   INR  --   --  1.1  CALCIUM  8.1*  --   --     --------------------------------------------------------------------------------------------------------------- Lab Results  Component Value Date   CHOL 105 07/31/2020   HDL 47 07/31/2020   LDLCALC 51 07/31/2020   TRIG 37 07/31/2020   CHOLHDL 2.2 07/31/2020    Lab Results  Component Value Date   HGBA1C 5.6 07/20/2023   No results for input(s):  "TSH", "T4TOTAL", "FREET4", "T3FREE", "THYROIDAB" in the last 72 hours. No results for input(s): "VITAMINB12", "FOLATE", "FERRITIN", "TIBC", "IRON ", "RETICCTPCT" in the last 72 hours. ------------------------------------------------------------------------------------------------------------------ Cardiac Enzymes No results for input(s): "CKMB", "TROPONINI", "MYOGLOBIN" in the last 168 hours.  Invalid input(s): "CK"  Micro Results No results found for this or any previous visit (from the past 240 hours).  Radiology Report DG Foot 2 Views Right Result Date: 08/19/2023 CLINICAL DATA:  Right foot wound. EXAM: RIGHT FOOT - 2 VIEW COMPARISON:  01/19/2024 FINDINGS: Diffuse subcutaneous soft tissue swelling is noted but no gas is seen in the soft tissues. The bony structures are intact. No fracture or destructive bony changes to suggest osteomyelitis. No significant arthropathic findings. IMPRESSION: 1. Diffuse subcutaneous soft tissue swelling but no gas in the soft tissues. 2. No plain film findings for osteomyelitis. Electronically Signed   By: Marrian Siva M.D.   On: 08/19/2023 17:38     Signature  -   Lynnwood Sauer M.D on 08/20/2023 at 7:36 AM   -  To page go to [email or web address]

## 2023-08-20 NOTE — Anesthesia Procedure Notes (Signed)
 Anesthesia Regional Block: Adductor canal block   Pre-Anesthetic Checklist: , timeout performed,  Correct Patient, Correct Site, Correct Laterality,  Correct Procedure, Correct Position, site marked,  Risks and benefits discussed,  Surgical consent,  Pre-op evaluation,  At surgeon's request and post-op pain management  Laterality: Right  Prep: chloraprep       Needles:  Injection technique: Single-shot  Needle Type: Echogenic Stimulator Needle     Needle Length: 9cm  Needle Gauge: 21     Additional Needles:   Procedures:,,,, ultrasound used (permanent image in chart),,    Narrative:  Start time: 08/20/2023 9:00 AM End time: 08/20/2023 9:00 AM Injection made incrementally with aspirations every 5 mL.  Performed by: Personally  Anesthesiologist: Lethaniel Rave, MD  Additional Notes: Discussed risks and benefits of the nerve block in detail, including but not limited vascular injury, permanent nerve damage and infection.   Patient tolerated the procedure well. Local anesthetic introduced in an incremental fashion under minimal resistance after negative aspirations. No paresthesias were elicited. After completion of the procedure, no acute issues were identified and patient continued to be monitored by RN.

## 2023-08-21 ENCOUNTER — Encounter (HOSPITAL_COMMUNITY): Payer: Self-pay | Admitting: Orthopedic Surgery

## 2023-08-21 DIAGNOSIS — I96 Gangrene, not elsewhere classified: Secondary | ICD-10-CM | POA: Diagnosis not present

## 2023-08-21 LAB — BASIC METABOLIC PANEL WITH GFR
Anion gap: 10 (ref 5–15)
BUN: <5 mg/dL — ABNORMAL LOW (ref 6–20)
CO2: 26 mmol/L (ref 22–32)
Calcium: 8.3 mg/dL — ABNORMAL LOW (ref 8.9–10.3)
Chloride: 97 mmol/L — ABNORMAL LOW (ref 98–111)
Creatinine, Ser: 0.5 mg/dL — ABNORMAL LOW (ref 0.61–1.24)
GFR, Estimated: >60 mL/min (ref >60)
Glucose, Bld: 124 mg/dL — ABNORMAL HIGH (ref 70–99)
Potassium: 3.4 mmol/L — ABNORMAL LOW (ref 3.5–5.1)
Sodium: 133 mmol/L — ABNORMAL LOW (ref 135–145)

## 2023-08-21 LAB — CBC WITH DIFFERENTIAL/PLATELET
Abs Immature Granulocytes: 0.06 10*3/uL (ref 0.00–0.07)
Basophils Absolute: 0 10*3/uL (ref 0.0–0.1)
Basophils Relative: 0 %
Eosinophils Absolute: 0 10*3/uL (ref 0.0–0.5)
Eosinophils Relative: 0 %
HCT: 36.4 % — ABNORMAL LOW (ref 39.0–52.0)
Hemoglobin: 12.3 g/dL — ABNORMAL LOW (ref 13.0–17.0)
Immature Granulocytes: 1 %
Lymphocytes Relative: 15 %
Lymphs Abs: 1.8 10*3/uL (ref 0.7–4.0)
MCH: 29.6 pg (ref 26.0–34.0)
MCHC: 33.8 g/dL (ref 30.0–36.0)
MCV: 87.7 fL (ref 80.0–100.0)
Monocytes Absolute: 1.8 10*3/uL — ABNORMAL HIGH (ref 0.1–1.0)
Monocytes Relative: 14 %
Neutro Abs: 8.6 10*3/uL — ABNORMAL HIGH (ref 1.7–7.7)
Neutrophils Relative %: 70 %
Platelets: 169 10*3/uL (ref 150–400)
RBC: 4.15 MIL/uL — ABNORMAL LOW (ref 4.22–5.81)
RDW: 14 % (ref 11.5–15.5)
WBC: 12.3 10*3/uL — ABNORMAL HIGH (ref 4.0–10.5)
nRBC: 0 % (ref 0.0–0.2)

## 2023-08-21 LAB — PROCALCITONIN: Procalcitonin: <0.1 ng/mL

## 2023-08-21 LAB — MAGNESIUM: Magnesium: 1.8 mg/dL (ref 1.7–2.4)

## 2023-08-21 LAB — PHOSPHORUS: Phosphorus: 3.7 mg/dL (ref 2.5–4.6)

## 2023-08-21 LAB — BRAIN NATRIURETIC PEPTIDE: B Natriuretic Peptide: 67 pg/mL (ref 0.0–100.0)

## 2023-08-21 LAB — C-REACTIVE PROTEIN: CRP: 3.6 mg/dL — ABNORMAL HIGH (ref <1.0)

## 2023-08-21 MED ORDER — DIPHENHYDRAMINE HCL 25 MG PO CAPS: 25.0000 mg | ORAL_CAPSULE | Freq: Once | ORAL | Status: DC | PRN

## 2023-08-21 MED ORDER — LACTATED RINGERS IV SOLN: INTRAVENOUS | Status: DC

## 2023-08-21 MED ORDER — CHLORDIAZEPOXIDE HCL 5 MG PO CAPS
15.0000 mg | ORAL_CAPSULE | Freq: Three times a day (TID) | ORAL | Status: DC
  Administered 2023-08-21 – 2023-08-22 (×6): 15 mg via ORAL
  Filled 2023-08-21 (×7): qty 3

## 2023-08-21 MED ORDER — HYDROMORPHONE HCL 1 MG/ML IJ SOLN: 0.5000 mg | INTRAMUSCULAR | Status: DC | PRN

## 2023-08-21 MED ORDER — POTASSIUM CHLORIDE CRYS ER 20 MEQ PO TBCR
40.0000 meq | EXTENDED_RELEASE_TABLET | Freq: Once | ORAL | Status: AC
  Administered 2023-08-21: 40 meq via ORAL
  Filled 2023-08-21: qty 2

## 2023-08-21 NOTE — Progress Notes (Signed)
 PROGRESS NOTE                                                                                                                                                                                                             Patient Demographics:    Jake Nguyen, is a 58 y.o. male, DOB - [birth year], BJY:[id number]  Outpatient Primary MD for the patient is Trenton Frock, FNP    LOS - 2  Admit date - 08/19/2023    Chief Complaint  Patient presents with   Wound Infection       Brief Narrative (HPI from H&P)   58 y.o. male with medical history significant for alcohol  use disorder, s/p left AKA, depression/anxiety, tobacco use, homelessness, and chronic right lower leg and foot infection who presented to the ED for evaluation of worsening pain and infection of right lower extremity.   Patient has prior history of left AKA due to infection.  He was last admitted on 4/10 for worsening of right lower extremity wound infection.  Case was discussed with Dr. Julio Ohm who was going to see patient to discuss possible right BKA however patient ultimately left AGAINST MEDICAL ADVICE on 4/14.  Patient returns with worsening pain, swelling, infectious appearance, and odor of his right foot.  His leg was covered in maggots on arrival.   He reports daily alcohol  use, maybe a 12 pack of beer daily.  He also reports smoking up to 1 pack of cigarettes daily.  He says he is living on the streets.  Further workup In the ER was suggestive of right foot gangrene and he was admitted for further care.     Subjective:   Patient in bed, appears comfortable, denies any headache, no fever, no chest pain or pressure, no shortness of breath , no abdominal pain. No focal weakness.   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  Right leg covered in maggots on arrival.  Was recommended  BKA when admitted last month but he left AMA. Continue broad-spectrum antibiotics with IV vancomycin , cefepime , Flagyl , Follow blood cultures, seen by Dr. Julio Ohm underwent right BKA on 08/20/2023, monitor cultures have remained negative discontinue antibiotics on 08/22/2023.   Alcohol  use disorder with intoxication: Patient reports drinking around 12 beers daily.  He is intoxicated with serum ethanol 312 on admission.  High risk for withdrawal.  Placed on CIWA protocol with Ativan  as needed, Continue thiamine , folate, MVM -  Librium  taper   Hypotension:  Borderline hypotensive on arrival - dehydration.  Received IV fluid bolus with improvement.  Continue IV fluid hydration for today, better   Hypokalemia:  Supplementing.   Depression/anxiety:  Not taking any medications.  Gently stable, not suicidal or homicidal.   Tobacco use:  Patient reports smoking about 1 pack/day.  He declines nicotine  patch.  Counseled to quit.   Homelessness: TOC consult.      Condition -   Guarded  Family Communication  :  None  Code Status :  Ful  Consults  :  Dr Julio Ohm  PUD Prophylaxis :  PPI   Procedures  :     Right BKA by Dr. Julio Ohm on 08/20/2023      Disposition Plan  :    Status is: Inpatient   DVT Prophylaxis  :    heparin  injection 5,000 Units Start: 08/20/23 2200    Lab Results  Component Value Date   PLT 169 08/21/2023    Diet :  Diet Order             Diet Heart Room service appropriate? Yes; Fluid consistency: Thin  Diet effective now                    Inpatient Medications  Scheduled Meds:  vitamin C  1,000 mg Oral Daily   atorvastatin   40 mg Oral Daily   chlordiazePOXIDE   15 mg Oral TID   folic acid   1 mg Oral Daily   heparin  injection (subcutaneous)  5,000 Units Subcutaneous Q8H   multivitamin with minerals  1 tablet Oral Daily   nutrition supplement (JUVEN)  1 packet Oral BID BM   pantoprazole   40 mg Oral Daily   potassium chloride   40 mEq Oral Once   sodium  chloride flush  3 mL Intravenous Q12H   thiamine   100 mg Oral Daily   Or   thiamine   100 mg Intravenous Daily   zinc  sulfate (50mg  elemental zinc )  220 mg Oral Daily   Continuous Infusions:  sodium chloride      ceFEPime  (MAXIPIME ) IV 200 mL/hr at 08/21/23 0648   lactated ringers  75 mL/hr at 08/21/23 0641   metronidazole  500 mg (08/21/23 0237)   vancomycin  1,000 mg (08/21/23 0555)   PRN Meds:.acetaminophen  **OR** acetaminophen , acetaminophen , HYDROmorphone  (DILAUDID ) injection, loperamide , LORazepam  **OR** LORazepam , ondansetron  **OR** ondansetron  (ZOFRAN ) IV, oxyCODONE , senna-docusate  Antibiotics  :    Anti-infectives (From admission, onward)    Start     Dose/Rate Route Frequency Ordered Stop   08/20/23 1600  ceFEPIme  (MAXIPIME ) 2 g in sodium chloride  0.9 % 100 mL IVPB        2 g 200 mL/hr over 30 Minutes Intravenous Every 8 hours 08/20/23 0838     08/20/23 1130  ceFAZolin (ANCEF) IVPB 2g/100 mL premix        2 g 200 mL/hr over 30 Minutes Intravenous Every 8 hours 08/20/23 1115 08/20/23 2100   08/20/23 0845  ceFAZolin (ANCEF) IVPB 2g/100 mL premix        2 g 200 mL/hr over 30 Minutes Intravenous On call to O.R. 08/20/23 1610 08/20/23 0933   08/20/23 0817  ceFAZolin (ANCEF) 2-4 GM/100ML-% IVPB       Note to Pharmacy: Graylon Leader: cabinet override  08/20/23 0817 08/20/23 0936   08/20/23 0530  vancomycin  (VANCOCIN ) IVPB 1000 mg/200 mL premix        1,000 mg 200 mL/hr over 60 Minutes Intravenous Every 12 hours 08/19/23 2015     08/20/23 0000  ceFEPIme  (MAXIPIME ) 1 g in sodium chloride  0.9 % 100 mL IVPB  Status:  Discontinued        1 g 200 mL/hr over 30 Minutes Intravenous Every 8 hours 08/19/23 2015 08/20/23 0838   08/19/23 1600  metroNIDAZOLE  (FLAGYL ) IVPB 500 mg        500 mg 100 mL/hr over 60 Minutes Intravenous Every 12 hours 08/19/23 1549 08/26/23 1559   08/19/23 1500  vancomycin  (VANCOCIN ) IVPB 1000 mg/200 mL premix        1,000 mg 200 mL/hr over 60 Minutes  Intravenous  Once 08/19/23 1446 08/19/23 1840   08/19/23 1500  ceFEPIme  (MAXIPIME ) 2 g in sodium chloride  0.9 % 100 mL IVPB        2 g 200 mL/hr over 30 Minutes Intravenous  Once 08/19/23 1446 08/19/23 1632         Objective:   Vitals:   08/21/23 0400 08/21/23 0550 08/21/23 0557 08/21/23 0800  BP: (!) 84/49 93/62 95/79  111/73  Pulse:    95  Resp: 17 17 16    Temp:    98.6 F (37 C)  TempSrc:   Oral Oral  SpO2:    97%  Weight:      Height:        Wt Readings from Last 3 Encounters:  08/20/23 63.8 kg  01/23/23 63 kg  03/26/21 63.1 kg     Intake/Output Summary (Last 24 hours) at 08/21/2023 0952 Last data filed at 08/21/2023 1610 Gross per 24 hour  Intake 3482.42 ml  Output 2530 ml  Net 952.42 ml     Physical Exam  Awake Alert, No new F.N deficits, Normal affect [place].AT,PERRAL Supple Neck, No JVD,   Symmetrical Chest wall movement, Good air movement bilaterally, CTAB RRR,No Gallops,Rubs or new Murmurs,  +ve B.Sounds, Abd Soft, No tenderness,   Right BKA stump site under bandage with wound VAC, chronic left AKA,   RN pressure injury documentation: Pressure Injury 11/25/20 Sacrum Mid;Upper Stage 2 -  Partial thickness loss of dermis presenting as a shallow open injury with a red, pink wound bed without slough. area of broken skin between buttocks just below sacrum. (Active)  11/25/20 0806  Location: Sacrum  Location Orientation: Mid;Upper  Staging: Stage 2 -  Partial thickness loss of dermis presenting as a shallow open injury with a red, pink wound bed without slough.  Wound Description (Comments): area of broken skin between buttocks just below sacrum.  Present on Admission:      Pressure Injury 03/19/21 Buttocks Left Stage 2 -  Partial thickness loss of dermis presenting as a shallow open injury with a red, pink wound bed without slough. pink red (Active)  03/19/21 2300  Location: Buttocks  Location Orientation: Left  Staging: Stage 2 -  Partial thickness loss  of dermis presenting as a shallow open injury with a red, pink wound bed without slough.  Wound Description (Comments): pink red  Present on Admission: Yes     Pressure Injury 03/19/21 Buttocks Right Stage 2 -  Partial thickness loss of dermis presenting as a shallow open injury with a red, pink wound bed without slough. red, pink (Active)  03/19/21 2300  Location: Buttocks  Location Orientation: Right  Staging: Stage 2 -  Partial thickness loss of dermis presenting as a shallow open injury with a red, pink wound bed without slough.  Wound Description (Comments): red, pink  Present on Admission: Yes     Pressure Injury 07/21/23 Buttocks Left Stage 2 -  Partial thickness loss of dermis presenting as a shallow open injury with a red, pink wound bed without slough. area very dry, flaking.  Small open area (Active)  07/21/23 1000  Location: Buttocks  Location Orientation: Left  Staging: Stage 2 -  Partial thickness loss of dermis presenting as a shallow open injury with a red, pink wound bed without slough.  Wound Description (Comments): area very dry, flaking.  Small open area  Present on Admission: Yes (pt states it is not new)  Dressing Type None 08/21/23 0440     Pressure Injury 07/21/23 Buttocks Right Stage 2 -  Partial thickness loss of dermis presenting as a shallow open injury with a red, pink wound bed without slough. area very dry, flaky.  Small area where skin had flaked off (Active)  07/21/23 1000  Location: Buttocks  Location Orientation: Right  Staging: Stage 2 -  Partial thickness loss of dermis presenting as a shallow open injury with a red, pink wound bed without slough.  Wound Description (Comments): area very dry, flaky.  Small area where skin had flaked off  Present on Admission: Yes (pt states it is not new)  Dressing Type None 08/21/23 0440      Data Review:    Recent Labs  Lab 08/19/23 1547 08/20/23 0631 08/21/23 0508  WBC 7.0 6.2 12.3*  HGB 14.2 12.0* 12.3*   HCT 43.8 35.9* 36.4*  PLT 217 169 169  MCV 91.4 88.2 87.7  MCH 29.6 29.5 29.6  MCHC 32.4 33.4 33.8  RDW 14.5 14.2 14.0  LYMPHSABS 2.3  --  1.8  MONOABS 0.9  --  1.8*  EOSABS 0.3  --  0.0  BASOSABS 0.1  --  0.0    Recent Labs  Lab 08/19/23 1547 08/19/23 1605 08/20/23 0631 08/21/23 0508  NA 138  --  133* 133*  K 3.4*  --  3.8 3.4*  CL 100  --  100 97*  CO2 24  --  23 26  ANIONGAP 14  --  10 10  GLUCOSE 105*  --  112* 124*  BUN <5*  --  <5* <5*  CREATININE 0.58*  --  0.60* 0.50*  AST 35  --   --   --   ALT 15  --   --   --   ALKPHOS 58  --   --   --   BILITOT 0.3  --   --   --   ALBUMIN 2.9*  --   --   --   CRP  --   --  0.9 3.6*  PROCALCITON  --   --  <0.10 <0.10  LATICACIDVEN  --  3.1*  --   --   INR  --   --  1.1  --   BNP  --   --   --  67.0  MG  --   --  1.7 1.8  PHOS  --   --   --  3.7  CALCIUM  8.1*  --  7.8* 8.3*      Recent Labs  Lab 08/19/23 1547 08/19/23 1605 08/20/23 0631 08/21/23 0508  CRP  --   --  0.9 3.6*  PROCALCITON  --   --  <0.10 <0.10  LATICACIDVEN  --  3.1*  --   --   INR  --   --  1.1  --   BNP  --   --   --  67.0  MG  --   --  1.7 1.8  CALCIUM  8.1*  --  7.8* 8.3*    --------------------------------------------------------------------------------------------------------------- Lab Results  Component Value Date   CHOL 105 07/31/2020   HDL 47 07/31/2020   LDLCALC 51 07/31/2020   TRIG 37 07/31/2020   CHOLHDL 2.2 07/31/2020    Lab Results  Component Value Date   HGBA1C 5.6 07/20/2023   No results for input(s): "TSH", "T4TOTAL", "FREET4", "T3FREE", "THYROIDAB" in the last 72 hours. No results for input(s): "VITAMINB12", "FOLATE", "FERRITIN", "TIBC", "IRON ", "RETICCTPCT" in the last 72 hours. ------------------------------------------------------------------------------------------------------------------ Cardiac Enzymes No results for input(s): "CKMB", "TROPONINI", "MYOGLOBIN" in the last 168 hours.  Invalid input(s):  "CK"  Micro Results Recent Results (from the past 240 hours)  Blood culture (routine x 2)     Status: None (Preliminary result)   Collection Time: 08/19/23  2:46 PM   Specimen: BLOOD  Result Value Ref Range Status   Specimen Description BLOOD SITE NOT SPECIFIED  Final   Special Requests   Final    BOTTLES DRAWN AEROBIC ONLY Blood Culture results may not be optimal due to an inadequate volume of blood received in culture bottles   Culture   Final    NO GROWTH 2 DAYS Performed at [facility], [address], [place], [place] [address]    Report Status PENDING  Incomplete  Blood culture (routine x 2)     Status: None (Preliminary result)   Collection Time: 08/19/23  2:51 PM   Specimen: BLOOD  Result Value Ref Range Status   Specimen Description BLOOD SITE NOT SPECIFIED  Final   Special Requests   Final    BOTTLES DRAWN AEROBIC ONLY Blood Culture results may not be optimal due to an inadequate volume of blood received in culture bottles   Culture   Final    NO GROWTH 2 DAYS Performed at [facility], [address] [place], [place] [address]    Report Status PENDING  Incomplete    Radiology Report DG Foot 2 Views Right Result Date: 08/19/2023 CLINICAL DATA:  Right foot wound. EXAM: RIGHT FOOT - 2 VIEW COMPARISON:  01/19/2024 FINDINGS: Diffuse subcutaneous soft tissue swelling is noted but no gas is seen in the soft tissues. The bony structures are intact. No fracture or destructive bony changes to suggest osteomyelitis. No significant arthropathic findings. IMPRESSION: 1. Diffuse subcutaneous soft tissue swelling but no gas in the soft tissues. 2. No plain film findings for osteomyelitis. Electronically Signed   By: Marrian Siva M.D.   On: 08/19/2023 17:38     Signature  -   Lynnwood Sauer M.D on 08/21/2023 at 9:52 AM   -  To page go to [email or web address]

## 2023-08-21 NOTE — Evaluation (Addendum)
 Physical Therapy Evaluation Patient Details Name: Jake Nguyen MRN: [id number] DOB: 1965/06/28 Today's Date: 08/21/2023  History of Present Illness  58 y.o. male admitted 08/19/23 with worsening RLE pain and infection, noted maggots and gangrene; S/p Rt BKA 5/11.  PMH includes L AKA (07/2020), PAD, anxiety, ETOH abuse, tobacco abuse, homelessness.  Clinical Impression  Patient is s/p above surgery resulting in functional limitations due to the deficits listed below (see PT Problem List). Currently living in a group home with 3 steps to enter. Cannot fit w/c into bathroom. Pt required up to min assist for seated balance on EOB, and transfer training today with a sliding board. He has a wheelchair. Was able to stand pivot transfer PTA. Patient will benefit from continued inpatient follow up therapy, <3 hours/day. Likely needs resources for better handicap accessible housing. Patient will benefit from acute skilled PT to increase their independence and safety with mobility to facilitate discharge.         If plan is discharge home, recommend the following: A little help with walking and/or transfers;A little help with bathing/dressing/bathroom;Assistance with cooking/housework;Assist for transportation;Help with stairs or ramp for entrance   Can travel by private vehicle   No    Equipment Recommendations Other (comment) (Right elevating leg rest; for BKA knee extension.)  Recommendations for Other Services       Functional Status Assessment Patient has had a recent decline in their functional status and demonstrates the ability to make significant improvements in function in a reasonable and predictable amount of time.     Precautions / Restrictions Precautions Precautions: Fall;Knee Precaution Booklet Issued: No (Reviewed BKA precautions) Recall of Precautions/Restrictions: Impaired Precaution/Restrictions Comments: RLE wound vac Restrictions Weight Bearing Restrictions Per Provider  Order: Yes RLE Weight Bearing Per Provider Order: Non weight bearing Other Position/Activity Restrictions: hx of L AKA      Mobility  Bed Mobility Overal bed mobility: Needs Assistance Bed Mobility: Supine to Sit     Supine to sit: Contact guard     General bed mobility comments: CGA for safety to rise to EOB. Good UE strength, some difficulty adjusting balance.    Transfers Overall transfer level: Needs assistance Equipment used: Sliding board Transfers: Bed to chair/wheelchair/BSC            Lateral/Scoot Transfers: Min assist, +2 safety/equipment, With slide board General transfer comment: Min A (+2 for safety with first sliding board attempt), Light assist for balance initially and to remove board after transfer. Cues for placement of board/sequencing and minor balance correction.    Ambulation/Gait                  Stairs            Wheelchair Mobility     Tilt Bed    Modified Rankin (Stroke Patients Only)       Balance Overall balance assessment: Needs assistance Sitting-balance support: Single extremity supported Sitting balance-Leahy Scale: Poor Sitting balance - Comments: A couple of instances requiring min assist for posterior LOB.                                     Pertinent Vitals/Pain Pain Assessment Pain Assessment: Faces Faces Pain Scale: Hurts little more Pain Location: R residual limb Pain Descriptors / Indicators: Grimacing, Guarding, Sore Pain Intervention(s): Monitored during session, Repositioned    Home Living Family/patient expects to be discharged to:: Other (Comment) Living  Arrangements: Group Home   Type of Home: Group Home Home Access: Stairs to enter   Entrance Stairs-Number of Steps: 3   Home Layout: Able to live on main level with bedroom/bathroom Home Equipment: Rolling Walker (2 wheels);Wheelchair - manual Additional Comments: pt reports living in a "rooming house" for the past 3 years  with 3 steps to enter, able to live on main level but bathroom is shared and not w/c accessible. Per prior admission in April, pt reported living in his car    Prior Function Prior Level of Function : Needs assist             Mobility Comments: pt using WC at baseline, completing stand-pivot transfers. reports w/c would not fit in the bathroom so he would have to hop into bathroom. reports assist to bump up the steps in wheelchair ADLs Comments: reports sponge bathing, able to dress and toilet self     Extremity/Trunk Assessment   Upper Extremity Assessment Upper Extremity Assessment: Defer to OT evaluation    Lower Extremity Assessment Lower Extremity Assessment: RLE deficits/detail;LLE deficits/detail RLE Deficits / Details: Rt BKA, new, wearing shrinker sock, Wound VAC. LLE Deficits / Details: Hx Lt AKA    Cervical / Trunk Assessment Cervical / Trunk Assessment: Normal  Communication   Communication Communication: No apparent difficulties    Cognition Arousal: Alert Behavior During Therapy: WFL for tasks assessed/performed, Impulsive   PT - Cognitive impairments: No apparent impairments                         Following commands: Intact       Cueing Cueing Techniques: Verbal cues, Gestural cues     General Comments General comments (skin integrity, edema, etc.): VSS. Reviewed precautions.    Exercises Amputee Exercises Quad Sets: Strengthening, Right, 10 reps, Seated Gluteal Sets: Strengthening, Both, 10 reps, Seated Straight Leg Raises: Strengthening, Right, 5 reps, Seated   Assessment/Plan    PT Assessment Patient needs continued PT services  PT Problem List Decreased strength;Pain;Decreased knowledge of precautions;Decreased knowledge of use of DME;Decreased mobility;Decreased balance;Decreased activity tolerance;Decreased range of motion       PT Treatment Interventions DME instruction;Functional mobility training;Therapeutic  activities;Therapeutic exercise;Balance training;Neuromuscular re-education;Stair training;Patient/family education;Wheelchair mobility training;Modalities    PT Goals (Current goals can be found in the Care Plan section)  Acute Rehab PT Goals Patient Stated Goal: Get well PT Goal Formulation: With patient Time For Goal Achievement: 09/04/23 Potential to Achieve Goals: Good    Frequency Min 2X/week     Co-evaluation PT/OT/SLP Co-Evaluation/Treatment: Yes Reason for Co-Treatment: For patient/therapist safety;To address functional/ADL transfers PT goals addressed during session: Mobility/safety with mobility;Balance;Proper use of DME;Strengthening/ROM         AM-PAC PT "6 Clicks" Mobility  Outcome Measure Help needed turning from your back to your side while in a flat bed without using bedrails?: A Little Help needed moving from lying on your back to sitting on the side of a flat bed without using bedrails?: A Little Help needed moving to and from a bed to a chair (including a wheelchair)?: A Little Help needed standing up from a chair using your arms (e.g., wheelchair or bedside chair)?: Total Help needed to walk in hospital room?: Total Help needed climbing 3-5 steps with a railing? : Total 6 Click Score: 12    End of Session Equipment Utilized During Treatment: Gait belt Activity Tolerance: Patient tolerated treatment well Patient left: in chair;with call bell/phone within reach;with  chair alarm set (Lift pad in place, if needed.) Nurse Communication: Mobility status PT Visit Diagnosis: Muscle weakness (generalized) (M62.81);Difficulty in walking, not elsewhere classified (R26.2);Pain Pain - Right/Left: Right Pain - part of body: Leg    Time: 0901-0926 PT Time Calculation (min) (ACUTE ONLY): 25 min   Charges:   PT Evaluation $PT Eval Moderate Complexity: 1 Mod   PT General Charges $$ ACUTE PT VISIT: 1 Visit         Jory Ng, PT, DPT [facility]   Rehabilitation Services Physical Therapist Office: [phone] Website: [email or web address]   Alinda Irani 08/21/2023, 10:18 AM

## 2023-08-21 NOTE — Progress Notes (Signed)
 Inpatient Rehab Admissions Coordinator:   Received consult for CIR admission. Therapy is not recommending CIR at this time. Will sign off.   Rehab Admissons Coordinator Latrice Storlie, [place], [place] [phone]

## 2023-08-21 NOTE — Evaluation (Signed)
 Occupational Therapy Evaluation Patient Details Name: Jake Nguyen MRN: [id number] DOB: Dec 06, 1965 Today's Date: 08/21/2023   History of Present Illness   58 y.o. male admitted 08/19/23 with worsening RLE pain and infection, noted maggots and gangrene; of note, recent admit 07/20/23 with same issue, potential for R BKA but pt left [facility] 07/24/23. Other PMH includes L AKA (07/2020), PAD, anxiety, ETOH abuse, tobacco abuse, homelessness.     Clinical Impressions PTA, pt reports living in a boarding house w/ 3 steps to enter and reports the bathroom is not w/c accessible. Pt is typically Modified Independent w/ ADLs from a wheelchair level. Pt presents now with deficits in R residual limb pain, sitting balance and endurance. Pt requires Setup for UB ADL, Min-Mod A for LB ADLs and Min A (x 2 for safety) for initial sliding board transfer. Given deficits, inaccessible housing situation and lack of assist, recommend continued inpatient follow up therapy, <3 hours/day at DC.     If plan is discharge home, recommend the following:   A lot of help with walking and/or transfers;A lot of help with bathing/dressing/bathroom     Functional Status Assessment   Patient has had a recent decline in their functional status and demonstrates the ability to make significant improvements in function in a reasonable and predictable amount of time.     Equipment Recommendations   Other (comment) (drop arm BSC)     Recommendations for Other Services         Precautions/Restrictions   Precautions Precautions: Fall Precaution/Restrictions Comments: RLE wound vac Restrictions Weight Bearing Restrictions Per Provider Order: Yes RLE Weight Bearing Per Provider Order: Non weight bearing Other Position/Activity Restrictions: hx of L AKA     Mobility Bed Mobility Overal bed mobility: Needs Assistance Bed Mobility: Supine to Sit     Supine to sit: Contact guard     General bed mobility  comments: CGA for safety due to multiple LOB and difficulty gaining stability EOB    Transfers Overall transfer level: Needs assistance Equipment used: Sliding board Transfers: Bed to chair/wheelchair/BSC            Lateral/Scoot Transfers: Min assist, +2 safety/equipment, With slide board General transfer comment: Min A (+2 for safety with first sliding board attempt), cues for placement of board/sequencing and minor balance correction      Balance Overall balance assessment: Needs assistance Sitting-balance support: No upper extremity supported Sitting balance-Leahy Scale: Poor Sitting balance - Comments: reliant on UE support but able to intermittently sit briefly without UE support                                   ADL either performed or assessed with clinical judgement   ADL Overall ADL's : Needs assistance/impaired Eating/Feeding: Independent   Grooming: Set up;Sitting;Wash/dry face   Upper Body Bathing: Set up;Sitting   Lower Body Bathing: Minimal assistance;Sitting/lateral leans;Bed level   Upper Body Dressing : Set up;Sitting   Lower Body Dressing: Moderate assistance;Sitting/lateral leans;Bed level       Toileting- Clothing Manipulation and Hygiene: Moderate assistance;Sitting/lateral lean;Bed level               Vision Ability to See in Adequate Light: 0 Adequate Patient Visual Report: No change from baseline Vision Assessment?: No apparent visual deficits     Perception         Praxis         Pertinent  Vitals/Pain Pain Assessment Pain Assessment: Faces Faces Pain Scale: Hurts little more Pain Location: R residual limb Pain Descriptors / Indicators: Grimacing, Guarding, Sore Pain Intervention(s): Monitored during session, Limited activity within patient's tolerance, Premedicated before session     Extremity/Trunk Assessment Upper Extremity Assessment Upper Extremity Assessment: Overall WFL for tasks assessed;Right hand  dominant   Lower Extremity Assessment Lower Extremity Assessment: Defer to PT evaluation   Cervical / Trunk Assessment Cervical / Trunk Assessment: Normal   Communication Communication Communication: No apparent difficulties   Cognition Arousal: Alert Behavior During Therapy: WFL for tasks assessed/performed, Impulsive Cognition: No apparent impairments             OT - Cognition Comments: likely at baseline                 Following commands: Intact       Cueing  General Comments   Cueing Techniques: Verbal cues;Gestural cues      Exercises     Shoulder Instructions      Home Living Family/patient expects to be discharged to:: Other (Comment)                                 Additional Comments: pt reports living in a "rooming house" for the past 3 years with 3 steps to enter, able to live on main level but bathroom is shared and not w/c accessible. Per prior admission in April, pt reported living in his car      Prior Functioning/Environment Prior Level of Function : Needs assist             Mobility Comments: pt using WC at baseline, completing stand-pivot transfers. reports w/c would not fit in the bathroom so he would have to hop into bathroom. reports assist to bump up the steps in wheelchair ADLs Comments: reports sponge bathing, able to dress and toilet self    OT Problem List: Decreased activity tolerance;Impaired balance (sitting and/or standing);Decreased knowledge of precautions;Decreased knowledge of use of DME or AE;Pain   OT Treatment/Interventions: Self-care/ADL training;Therapeutic exercise;DME and/or AE instruction;Energy conservation;Therapeutic activities;Patient/family education;Balance training      OT Goals(Current goals can be found in the care plan section)   Acute Rehab OT Goals Patient Stated Goal: be able to do everything myself OT Goal Formulation: With patient Time For Goal Achievement:  09/04/23 Potential to Achieve Goals: Good ADL Goals Pt Will Perform Lower Body Bathing: with modified independence;sitting/lateral leans;bed level Pt Will Perform Lower Body Dressing: with modified independence;sitting/lateral leans;bed level Pt Will Transfer to Toilet: with modified independence;with transfer board;anterior/posterior transfer;bedside commode Pt Will Perform Toileting - Clothing Manipulation and hygiene: with modified independence;sitting/lateral leans;bed level Pt/caregiver will Perform Home Exercise Program: Increased strength;Both right and left upper extremity;With theraband;Independently;With written HEP provided   OT Frequency:  Min 2X/week    Co-evaluation              AM-PAC OT "6 Clicks" Daily Activity     Outcome Measure Help from another person eating meals?: None Help from another person taking care of personal grooming?: A Little Help from another person toileting, which includes using toliet, bedpan, or urinal?: A Lot Help from another person bathing (including washing, rinsing, drying)?: A Lot Help from another person to put on and taking off regular upper body clothing?: A Little Help from another person to put on and taking off regular lower body clothing?: A Lot 6 Click Score:  16   End of Session Nurse Communication: Mobility status  Activity Tolerance: Patient tolerated treatment well Patient left: in chair;with call bell/phone within reach  OT Visit Diagnosis: Other abnormalities of gait and mobility (R26.89)                Time: 1610-9604 OT Time Calculation (min): 25 min Charges:  OT General Charges $OT Visit: 1 Visit OT Evaluation $OT Eval Moderate Complexity: 1 Mod  Lawrence Pretty, OTR/L Acute Rehab Services Office: [phone]   Annabella Barr 08/21/2023, 9:43 AM

## 2023-08-21 NOTE — Progress Notes (Signed)
   08/21/23 1047  Mobility  Activity Transferred from chair to bed  Level of Assistance Maximum assist, patient does 25-49%  Assistive Device MaxiMove  RLE Weight Bearing Per Provider Order NWB  Activity Response Tolerated fair  Mobility Referral Yes  Mobility visit 1 Mobility  Mobility Specialist Start Time (ACUTE ONLY) 1047  Mobility Specialist Stop Time (ACUTE ONLY) 1105  Mobility Specialist Time Calculation (min) (ACUTE ONLY) 18 min   Mobility Specialist: Progress Note  Pt agreeable to mobility session - received in chair. C/o  RLE soreness rated 6/10. Returned to bed with all needs met - call bell within reach. RN present during session.   Isla Mari, BS Mobility Specialist Please contact via SecureChat or  Rehab office at [phone].

## 2023-08-21 NOTE — TOC Initial Note (Addendum)
 Transition of Care [facility]) - Initial/Assessment Note    Patient Details  Name: Jake Nguyen MRN: [id number] Date of Birth: 18-Jul-1965  [facility]) CM/SW Contact:    Carmon Christen, LCSWA Phone Number: 08/21/2023, 4:52 PM  Clinical Narrative:                  CSW received consult for possible SNF placement at time of discharge. CSW spoke with patient regarding PT recommendation of SNF placement at time of discharge. Patient reports he is homeless.Patient expressed understanding of PT recommendation and politely declined  SNF placement at time of discharge. Patient plans to stay with a friend when medically stable for dc. Patient reports his brother will pick him up when medically stable. CSW asked if patient would like housing resources and [employer]. Patient agreeable to receive resources. CSW will drop off resources by room when able.No further questions reported at this time. CSW to continue to follow and assist with discharge planning needs.   Expected Discharge Plan: Home/Self Care Barriers to Discharge: Continued Medical Work up   Patient Goals and CMS Choice Patient states their goals for this hospitalization and ongoing recovery are:: to return home   Choice offered to / list presented to : Patient      Expected Discharge Plan and Services In-house Referral: Clinical Social Work                                            Prior Living Arrangements/Services   Lives with:: Self Patient language and need for interpreter reviewed:: Yes Do you feel safe going back to the place where you live?: Yes      Need for Family Participation in Patient Care: Yes (Comment) Care giver support system in place?: Yes (comment)   Criminal Activity/Legal Involvement Pertinent to Current Situation/Hospitalization: No - Comment as needed  Activities of Daily Living      Permission Sought/Granted Permission sought to share information with :  Case Manager, Family Supports, Magazine features editor                Emotional Assessment   Attitude/Demeanor/Rapport: Gracious Affect (typically observed): Calm Orientation: : Oriented to Self, Oriented to Place, Oriented to  Time, Oriented to Situation Alcohol  / Substance Use: Not Applicable Psych Involvement: No (comment)  Admission diagnosis:  Gangrene (HCC) [I96] Gangrene of right foot (HCC) [I96] Patient Active Problem List   Diagnosis Date Noted   Gangrene of right foot (HCC) 08/19/2023   Alcohol  use disorder, severe, dependence (HCC) 08/19/2023   Hypotension 08/19/2023   Depression with anxiety 08/19/2023   Tobacco use 08/19/2023   Right foot infection 07/21/2023   Wound infection 07/20/2023   Hypothermia    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   Gram-negative bacteremia 11/24/2020   Cellulitis of left leg 11/19/2020   History of alcohol  withdrawal syndrome 11/19/2020   Hypokalemia 11/19/2020   S/P BKA (below knee amputation) unilateral, left (HCC)    Neuropathic pain, leg, left    Gangrene of foot (HCC) 07/14/2020   Ataxia    Pressure injury of skin 05/25/2018   Rhabdomyolysis 05/20/2018   Hyponatremia 05/20/2018   Elevated troponin 05/20/2018   Moderate dehydration 05/20/2018   Alcohol  use 05/20/2018   Transaminitis 05/20/2018   PCP:  Trenton Frock, FNP Pharmacy:   [facility] [phone] Jonette Nestle, [place] - [address] AT [address] [place] [address] Phone: [phone] Fax: [phone]     Social Drivers of Health (SDOH) Social History: SDOH Screenings   Food Insecurity: Food Insecurity Present (08/19/2023)  Housing: High Risk (08/19/2023)  Transportation Needs: Unmet Transportation Needs (08/19/2023)  Utilities: Not At Risk (08/19/2023)  Alcohol  Screen: Medium Risk (11/25/2020)  Depression (PHQ2-9): Low Risk  (11/06/2019)  Social Connections: Socially Isolated (07/20/2023)  Tobacco Use: High Risk  (08/20/2023)   SDOH Interventions:     Readmission Risk Interventions     No data to display

## 2023-08-21 NOTE — Progress Notes (Signed)
 Patient ID: Jake Nguyen, male   DOB: 21-Dec-1965, 58 y.o.   MRN: [id number] Patient is a 58 year old gentleman postoperative day 1 right transtibial amputation.  Patient denies any pain.  There is no drainage in the wound VAC canister with a good suction fit.  Patient will discharge with a Prevena plus portable wound VAC pump.

## 2023-08-22 DIAGNOSIS — I96 Gangrene, not elsewhere classified: Secondary | ICD-10-CM | POA: Diagnosis not present

## 2023-08-22 LAB — CBC WITH DIFFERENTIAL/PLATELET
Abs Immature Granulocytes: 0.04 10*3/uL (ref 0.00–0.07)
Basophils Absolute: 0 10*3/uL (ref 0.0–0.1)
Basophils Relative: 0 %
Eosinophils Absolute: 0.1 10*3/uL (ref 0.0–0.5)
Eosinophils Relative: 1 %
HCT: 37.2 % — ABNORMAL LOW (ref 39.0–52.0)
Hemoglobin: 12.8 g/dL — ABNORMAL LOW (ref 13.0–17.0)
Immature Granulocytes: 0 %
Lymphocytes Relative: 27 %
Lymphs Abs: 2.8 10*3/uL (ref 0.7–4.0)
MCH: 30.2 pg (ref 26.0–34.0)
MCHC: 34.4 g/dL (ref 30.0–36.0)
MCV: 87.7 fL (ref 80.0–100.0)
Monocytes Absolute: 1.5 10*3/uL — ABNORMAL HIGH (ref 0.1–1.0)
Monocytes Relative: 14 %
Neutro Abs: 6.1 10*3/uL (ref 1.7–7.7)
Neutrophils Relative %: 58 %
Platelets: 171 10*3/uL (ref 150–400)
RBC: 4.24 MIL/uL (ref 4.22–5.81)
RDW: 14 % (ref 11.5–15.5)
WBC: 10.6 10*3/uL — ABNORMAL HIGH (ref 4.0–10.5)
nRBC: 0 % (ref 0.0–0.2)

## 2023-08-22 LAB — BASIC METABOLIC PANEL WITH GFR
Anion gap: 9 (ref 5–15)
BUN: 5 mg/dL — ABNORMAL LOW (ref 6–20)
CO2: 27 mmol/L (ref 22–32)
Calcium: 8.1 mg/dL — ABNORMAL LOW (ref 8.9–10.3)
Chloride: 96 mmol/L — ABNORMAL LOW (ref 98–111)
Creatinine, Ser: 0.5 mg/dL — ABNORMAL LOW (ref 0.61–1.24)
GFR, Estimated: >60 mL/min (ref >60)
Glucose, Bld: 120 mg/dL — ABNORMAL HIGH (ref 70–99)
Potassium: 3.4 mmol/L — ABNORMAL LOW (ref 3.5–5.1)
Sodium: 132 mmol/L — ABNORMAL LOW (ref 135–145)

## 2023-08-22 LAB — C-REACTIVE PROTEIN: CRP: 10.2 mg/dL — ABNORMAL HIGH (ref <1.0)

## 2023-08-22 LAB — PROCALCITONIN: Procalcitonin: <0.1 ng/mL

## 2023-08-22 LAB — SURGICAL PATHOLOGY

## 2023-08-22 LAB — MAGNESIUM: Magnesium: 1.8 mg/dL (ref 1.7–2.4)

## 2023-08-22 LAB — BRAIN NATRIURETIC PEPTIDE: B Natriuretic Peptide: 32.8 pg/mL (ref 0.0–100.0)

## 2023-08-22 LAB — PHOSPHORUS: Phosphorus: 3.6 mg/dL (ref 2.5–4.6)

## 2023-08-22 MED ORDER — POTASSIUM CHLORIDE CRYS ER 20 MEQ PO TBCR
40.0000 meq | EXTENDED_RELEASE_TABLET | Freq: Once | ORAL | Status: AC
Start: 2023-08-22 — End: 2023-08-22
  Administered 2023-08-22: 40 meq via ORAL
  Filled 2023-08-22: qty 2

## 2023-08-22 MED ORDER — DOXYCYCLINE HYCLATE 100 MG PO TABS
100.0000 mg | ORAL_TABLET | Freq: Two times a day (BID) | ORAL | Status: AC
  Administered 2023-08-22 (×2): 100 mg via ORAL
  Filled 2023-08-22 (×2): qty 1

## 2023-08-22 NOTE — Progress Notes (Addendum)
 PROGRESS NOTE                                                                                                                                                                                                             Patient Demographics:    Jake Nguyen, is a 58 y.o. male, DOB - [birth year], [name]:[id number]  Outpatient Primary MD for the patient is Trenton Frock, FNP    LOS - 3  Admit date - 08/19/2023    Chief Complaint  Patient presents with   Wound Infection       Brief Narrative (HPI from H&P)   58 y.o. male with medical history significant for alcohol  use disorder, s/p left AKA, depression/anxiety, tobacco use, homelessness, and chronic right lower leg and foot infection who presented to the ED for evaluation of worsening pain and infection of right lower extremity.   Patient has prior history of left AKA due to infection.  He was last admitted on 4/10 for worsening of right lower extremity wound infection.  Case was discussed with Dr. Julio Ohm who was going to see patient to discuss possible right BKA however patient ultimately left AGAINST MEDICAL ADVICE on 4/14.  Patient returns with worsening pain, swelling, infectious appearance, and odor of his right foot.  His leg was covered in maggots on arrival.   He reports daily alcohol  use, maybe a 12 pack of beer daily.  He also reports smoking up to 1 pack of cigarettes daily.  He says he is living on the streets.  Further workup In the ER was suggestive of right foot gangrene and he was admitted for further care.     Subjective:   Patient in bed, appears comfortable, denies any headache, no fever, no chest pain or pressure, no shortness of breath , no abdominal pain. No focal weakness.   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  Right leg  covered in maggots on arrival.  Was recommended BKA when admitted last month but he left AMA.  Was treated with empiric antibiotics  IV vancomycin , cefepime , Flagyl , far cultures are negative, underwent amputation on 08/20/2023, titrated down to oral doxycycline  on 08/22/2023, so far cultures negative continue to follow blood cultures, Dr.  Julio Ohm on board.   Alcohol  use disorder with intoxication: Patient reports drinking around 12 beers daily.  He is intoxicated with serum ethanol 312 on admission.  High risk for withdrawal.  Placed on CIWA protocol with Ativan  as needed, Continue thiamine , folate, MVM -  Librium  taper   Hypotension:  Borderline hypotensive on arrival - dehydration.  Received IV fluid bolus with improvement.  Continue IV fluid hydration for today, better   Hypokalemia:  Supplementing.   Depression/anxiety:  Not taking any medications.  Gently stable, not suicidal or homicidal.   Tobacco use:  Patient reports smoking about 1 pack/day.  He declines nicotine  patch.  Counseled to quit.   Homelessness, extremely poor personal hygiene, had maggots in his right foot upon admission, extremely poor hygiene in the hospital: [facility] consult.  Nursing staff requested to keep him as clean as possible.      Condition -   Guarded  Family Communication  :  None  Code Status :  Full  Consults  :  Dr Julio Ohm  PUD Prophylaxis :  PPI   Procedures  :     Right BKA by Dr. Julio Ohm on 08/20/2023      Disposition Plan  :    Status is: Inpatient   DVT Prophylaxis  :    heparin  injection 5,000 Units Start: 08/20/23 2200    Lab Results  Component Value Date   PLT 171 08/22/2023    Diet :  Diet Order             Diet Heart Room service appropriate? Yes; Fluid consistency: Thin  Diet effective now                    Inpatient Medications  Scheduled Meds:  vitamin C  1,000 mg Oral Daily   atorvastatin   40 mg Oral Daily   chlordiazePOXIDE   15 mg Oral TID   doxycycline   100 mg  Oral Q12H   folic acid   1 mg Oral Daily   heparin  injection (subcutaneous)  5,000 Units Subcutaneous Q8H   multivitamin with minerals  1 tablet Oral Daily   nutrition supplement (JUVEN)  1 packet Oral BID BM   pantoprazole   40 mg Oral Daily   potassium chloride   40 mEq Oral Once   sodium chloride  flush  3 mL Intravenous Q12H   thiamine   100 mg Oral Daily   Or   thiamine   100 mg Intravenous Daily   zinc  sulfate (50mg  elemental zinc )  220 mg Oral Daily   Continuous Infusions:  sodium chloride      PRN Meds:.acetaminophen  **OR** acetaminophen , acetaminophen , HYDROmorphone  (DILAUDID ) injection, loperamide , LORazepam  **OR** LORazepam , ondansetron  **OR** ondansetron  (ZOFRAN ) IV, oxyCODONE , senna-docusate     Objective:   Vitals:   08/21/23 2013 08/22/23 0000 08/22/23 0100 08/22/23 0710  BP: 107/68 116/76    Pulse: (!) 108  (!) 103   Resp: 17 17    Temp: 99.3 F (37.4 C) 98.4 F (36.9 C)    TempSrc: Oral Oral    SpO2: 96% 96%  98%  Weight:      Height:        Wt Readings from Last 3 Encounters:  08/20/23 63.8 kg  01/23/23 63 kg  03/26/21 63.1 kg     Intake/Output Summary (Last 24 hours) at 08/22/2023 0737 Last data filed at 08/22/2023 0710 Gross per 24 hour  Intake 3169.62 ml  Output 1140 ml  Net 2029.62 ml     Physical Exam  Awake  Alert, No new F.N deficits, Normal affect Melvin.AT,PERRAL Supple Neck, No JVD,   Symmetrical Chest wall movement, Good air movement bilaterally, CTAB RRR,No Gallops,Rubs or new Murmurs,  +ve B.Sounds, Abd Soft, No tenderness,   Right BKA stump site under bandage with wound VAC, chronic left AKA,   RN pressure injury documentation: Pressure Injury 11/25/20 Sacrum Mid;Upper Stage 2 -  Partial thickness loss of dermis presenting as a shallow open injury with a red, pink wound bed without slough. area of broken skin between buttocks just below sacrum. (Active)  11/25/20 0806  Location: Sacrum  Location Orientation: Mid;Upper  Staging:  Stage 2 -  Partial thickness loss of dermis presenting as a shallow open injury with a red, pink wound bed without slough.  Wound Description (Comments): area of broken skin between buttocks just below sacrum.  Present on Admission:      Pressure Injury 03/19/21 Buttocks Left Stage 2 -  Partial thickness loss of dermis presenting as a shallow open injury with a red, pink wound bed without slough. pink red (Active)  03/19/21 2300  Location: Buttocks  Location Orientation: Left  Staging: Stage 2 -  Partial thickness loss of dermis presenting as a shallow open injury with a red, pink wound bed without slough.  Wound Description (Comments): pink red  Present on Admission: Yes     Pressure Injury 03/19/21 Buttocks Right Stage 2 -  Partial thickness loss of dermis presenting as a shallow open injury with a red, pink wound bed without slough. red, pink (Active)  03/19/21 2300  Location: Buttocks  Location Orientation: Right  Staging: Stage 2 -  Partial thickness loss of dermis presenting as a shallow open injury with a red, pink wound bed without slough.  Wound Description (Comments): red, pink  Present on Admission: Yes     Pressure Injury 07/21/23 Buttocks Left Stage 2 -  Partial thickness loss of dermis presenting as a shallow open injury with a red, pink wound bed without slough. area very dry, flaking.  Small open area (Active)  07/21/23 1000  Location: Buttocks  Location Orientation: Left  Staging: Stage 2 -  Partial thickness loss of dermis presenting as a shallow open injury with a red, pink wound bed without slough.  Wound Description (Comments): area very dry, flaking.  Small open area  Present on Admission: Yes (pt states it is not new)  Dressing Type None 08/22/23 0710     Pressure Injury 07/21/23 Buttocks Right Stage 2 -  Partial thickness loss of dermis presenting as a shallow open injury with a red, pink wound bed without slough. area very dry, flaky.  Small area where skin had  flaked off (Active)  07/21/23 1000  Location: Buttocks  Location Orientation: Right  Staging: Stage 2 -  Partial thickness loss of dermis presenting as a shallow open injury with a red, pink wound bed without slough.  Wound Description (Comments): area very dry, flaky.  Small area where skin had flaked off  Present on Admission: Yes (pt states it is not new)  Dressing Type None 08/22/23 0710      Data Review:    Recent Labs  Lab 08/19/23 1547 08/20/23 0631 08/21/23 0508 08/22/23 0426  WBC 7.0 6.2 12.3* 10.6*  HGB 14.2 12.0* 12.3* 12.8*  HCT 43.8 35.9* 36.4* 37.2*  PLT 217 169 169 171  MCV 91.4 88.2 87.7 87.7  MCH 29.6 29.5 29.6 30.2  MCHC 32.4 33.4 33.8 34.4  RDW 14.5 14.2 14.0 14.0  LYMPHSABS  2.3  --  1.8 2.8  MONOABS 0.9  --  1.8* 1.5*  EOSABS 0.3  --  0.0 0.1  BASOSABS 0.1  --  0.0 0.0    Recent Labs  Lab 08/19/23 1547 08/19/23 1605 08/20/23 0631 08/21/23 0508 08/22/23 0426  NA 138  --  133* 133* 132*  K 3.4*  --  3.8 3.4* 3.4*  CL 100  --  100 97* 96*  CO2 24  --  23 26 27   ANIONGAP 14  --  10 10 9   GLUCOSE 105*  --  112* 124* 120*  BUN <5*  --  <5* <5* 5*  CREATININE 0.58*  --  0.60* 0.50* 0.50*  AST 35  --   --   --   --   ALT 15  --   --   --   --   ALKPHOS 58  --   --   --   --   BILITOT 0.3  --   --   --   --   ALBUMIN 2.9*  --   --   --   --   CRP  --   --  0.9 3.6* 10.2*  PROCALCITON  --   --  <0.10 <0.10  --   LATICACIDVEN  --  3.1*  --   --   --   INR  --   --  1.1  --   --   BNP  --   --   --  67.0 32.8  MG  --   --  1.7 1.8 1.8  PHOS  --   --   --  3.7 3.6  CALCIUM  8.1*  --  7.8* 8.3* 8.1*      Recent Labs  Lab 08/19/23 1547 08/19/23 1605 08/20/23 0631 08/21/23 0508 08/22/23 0426  CRP  --   --  0.9 3.6* 10.2*  PROCALCITON  --   --  <0.10 <0.10  --   LATICACIDVEN  --  3.1*  --   --   --   INR  --   --  1.1  --   --   BNP  --   --   --  67.0 32.8  MG  --   --  1.7 1.8 1.8  CALCIUM  8.1*  --  7.8* 8.3* 8.1*     --------------------------------------------------------------------------------------------------------------- Lab Results  Component Value Date   CHOL 105 07/31/2020   HDL 47 07/31/2020   LDLCALC 51 07/31/2020   TRIG 37 07/31/2020   CHOLHDL 2.2 07/31/2020    Lab Results  Component Value Date   HGBA1C 5.6 07/20/2023   No results for input(s): "TSH", "T4TOTAL", "FREET4", "T3FREE", "THYROIDAB" in the last 72 hours. No results for input(s): "VITAMINB12", "FOLATE", "FERRITIN", "TIBC", "IRON ", "RETICCTPCT" in the last 72 hours. ------------------------------------------------------------------------------------------------------------------ Cardiac Enzymes No results for input(s): "CKMB", "TROPONINI", "MYOGLOBIN" in the last 168 hours.  Invalid input(s): "CK"  Micro Results Recent Results (from the past 240 hours)  Blood culture (routine x 2)     Status: None (Preliminary result)   Collection Time: 08/19/23  2:46 PM   Specimen: BLOOD  Result Value Ref Range Status   Specimen Description BLOOD SITE NOT SPECIFIED  Final   Special Requests   Final    BOTTLES DRAWN AEROBIC ONLY Blood Culture results may not be optimal due to an inadequate volume of blood received in culture bottles   Culture   Final    NO GROWTH 2 DAYS Performed at [facility], [address] [place], [place] [address]    Report Status PENDING  Incomplete  Blood culture (routine x 2)     Status: None (Preliminary result)   Collection Time: 08/19/23  2:51 PM   Specimen: BLOOD  Result Value Ref Range Status   Specimen Description BLOOD SITE NOT SPECIFIED  Final   Special Requests   Final    BOTTLES DRAWN AEROBIC ONLY Blood Culture results may not be optimal due to an inadequate volume of blood received in culture bottles   Culture   Final    NO GROWTH 2 DAYS Performed at [facility], [address], [place], [place] [address]    Report Status PENDING  Incomplete    Radiology Report No  results found.    Signature  -   Lynnwood Sauer M.D on 08/22/2023 at 7:37 AM   -  To page go to [email or web address]

## 2023-08-23 LAB — BASIC METABOLIC PANEL WITH GFR
Anion gap: 6 (ref 5–15)
BUN: 5 mg/dL — ABNORMAL LOW (ref 6–20)
CO2: 27 mmol/L (ref 22–32)
Calcium: 8.2 mg/dL — ABNORMAL LOW (ref 8.9–10.3)
Chloride: 98 mmol/L (ref 98–111)
Creatinine, Ser: 0.5 mg/dL — ABNORMAL LOW (ref 0.61–1.24)
GFR, Estimated: >60 mL/min (ref >60)
Glucose, Bld: 128 mg/dL — ABNORMAL HIGH (ref 70–99)
Potassium: 3.3 mmol/L — ABNORMAL LOW (ref 3.5–5.1)
Sodium: 131 mmol/L — ABNORMAL LOW (ref 135–145)

## 2023-08-23 LAB — PROCALCITONIN: Procalcitonin: 0.13 ng/mL

## 2023-08-23 LAB — CBC WITH DIFFERENTIAL/PLATELET
Abs Immature Granulocytes: 0.04 10*3/uL (ref 0.00–0.07)
Basophils Absolute: 0.1 10*3/uL (ref 0.0–0.1)
Basophils Relative: 1 %
Eosinophils Absolute: 0.2 10*3/uL (ref 0.0–0.5)
Eosinophils Relative: 2 %
HCT: 37.5 % — ABNORMAL LOW (ref 39.0–52.0)
Hemoglobin: 12.7 g/dL — ABNORMAL LOW (ref 13.0–17.0)
Immature Granulocytes: 0 %
Lymphocytes Relative: 27 %
Lymphs Abs: 2.4 10*3/uL (ref 0.7–4.0)
MCH: 29.9 pg (ref 26.0–34.0)
MCHC: 33.9 g/dL (ref 30.0–36.0)
MCV: 88.2 fL (ref 80.0–100.0)
Monocytes Absolute: 1.4 10*3/uL — ABNORMAL HIGH (ref 0.1–1.0)
Monocytes Relative: 15 %
Neutro Abs: 5.1 10*3/uL (ref 1.7–7.7)
Neutrophils Relative %: 55 %
Platelets: 192 10*3/uL (ref 150–400)
RBC: 4.25 MIL/uL (ref 4.22–5.81)
RDW: 13.8 % (ref 11.5–15.5)
WBC: 9.1 10*3/uL (ref 4.0–10.5)
nRBC: 0 % (ref 0.0–0.2)

## 2023-08-23 LAB — PHOSPHORUS: Phosphorus: 3.8 mg/dL (ref 2.5–4.6)

## 2023-08-23 LAB — BRAIN NATRIURETIC PEPTIDE: B Natriuretic Peptide: 17.4 pg/mL (ref 0.0–100.0)

## 2023-08-23 LAB — MAGNESIUM: Magnesium: 1.8 mg/dL (ref 1.7–2.4)

## 2023-08-23 LAB — C-REACTIVE PROTEIN: CRP: 10.6 mg/dL — ABNORMAL HIGH (ref <1.0)

## 2023-08-23 MED ORDER — SODIUM CHLORIDE 0.9% FLUSH: 10.0000 mL | INTRAVENOUS | Status: DC | PRN

## 2023-08-23 MED ORDER — CHLORDIAZEPOXIDE HCL 5 MG PO CAPS
5.0000 mg | ORAL_CAPSULE | Freq: Three times a day (TID) | ORAL | Status: DC
  Administered 2023-08-23 – 2023-08-24 (×3): 5 mg via ORAL
  Filled 2023-08-23 (×3): qty 1

## 2023-08-23 MED ORDER — DOXYCYCLINE HYCLATE 100 MG PO TABS
100.0000 mg | ORAL_TABLET | Freq: Two times a day (BID) | ORAL | Status: DC
  Administered 2023-08-23: 100 mg via ORAL
  Filled 2023-08-23: qty 1

## 2023-08-23 MED ORDER — SODIUM CHLORIDE 0.9 % IV SOLN: INTRAVENOUS | Status: AC

## 2023-08-23 MED ORDER — SODIUM CHLORIDE 0.9% FLUSH
10.0000 mL | Freq: Two times a day (BID) | INTRAVENOUS | Status: DC
  Administered 2023-08-24: 10 mL

## 2023-08-23 MED ORDER — POTASSIUM CHLORIDE CRYS ER 20 MEQ PO TBCR
40.0000 meq | EXTENDED_RELEASE_TABLET | Freq: Four times a day (QID) | ORAL | Status: AC
  Administered 2023-08-23 (×2): 40 meq via ORAL
  Filled 2023-08-23 (×2): qty 2

## 2023-08-23 MED ORDER — SODIUM CHLORIDE 0.9 % IV SOLN: INTRAVENOUS | Status: DC

## 2023-08-23 NOTE — Progress Notes (Signed)
 PT Cancellation Note  Patient Details Name: Jake Nguyen MRN: [id number] DOB: 1965/07/30   Cancelled Treatment:    Reason Eval/Treat Not Completed: (P) Patient declined, no reason specified (pt defers until tomorrow, agreeable to work on lateral scoot transfers wtih slide board once his DME arrives to hospital to ensure he can transfer unassisted to prepare for DC to next setting.)    Arville Laughter 08/23/2023, 4:58 PM

## 2023-08-23 NOTE — Plan of Care (Signed)
  Problem: Education: Goal: Knowledge of General Education information will improve Description: Including pain rating scale, medication(s)/side effects and non-pharmacologic comfort measures Outcome: Progressing   Problem: Clinical Measurements: Goal: Respiratory complications will improve Outcome: Progressing   Problem: Elimination: Goal: Will not experience complications related to urinary retention Outcome: Progressing   Problem: Clinical Measurements: Goal: Ability to maintain clinical measurements within normal limits will improve Outcome: Not Progressing Goal: Cardiovascular complication will be avoided Outcome: Not Progressing   Problem: Skin Integrity: Goal: Risk for impaired skin integrity will decrease Outcome: Not Progressing

## 2023-08-23 NOTE — Progress Notes (Signed)
 PT Cancellation Note  Patient Details Name: Jake Nguyen MRN: [id number] DOB: 03-28-1966   Cancelled Treatment:    Reason Eval/Treat Not Completed: Patient declined, no reason specified  Declines to work with therapy this morning. States we can stop by later today and he may consider participating. Will attempt as schedule permits.  Jory Ng, PT, DPT [facility]  Rehabilitation Services Physical Therapist Office: [phone] Website: [email or web address]   Alinda Irani 08/23/2023, 9:33 AM

## 2023-08-23 NOTE — Progress Notes (Signed)

## 2023-08-23 NOTE — TOC Progression Note (Addendum)
 Transition of Care [facility]) - Progression Note    Patient Details  Name: Jake Nguyen MRN: [id number] Date of Birth: [birth year]  Transition of Care [facility]) CM/SW Contact  Eusebio High, RN Phone Number: 08/23/2023, 11:31 AM  Clinical Narrative:   RNCM met with patient bedside. Plan is for a tentative DC tomorrow. Patient will need an elevating leg rest and a drop arm BSC. Items have been ordered thru [employer] and will be delivered bedside.   Patient states he will go to a hotel after DC. Brother may transport- RNCM will contact brother Sandra Lafevers @ [phone]. If Brother doesn't transport, patient states he will take a bus. Patient is insistent he will find a way to get around.   TOC will continue to follow patient for any additional discharge needs   UPDATE:  12:00 PM RNCM called patient's Brother.  Went to VM. RNCM left message and call back number.   UPDATE: 12:10 PM RNCM as attached resources for Alcohol  Use and Homelessness resources to DC instructions          Expected Discharge Plan: Home/Self Care Barriers to Discharge: Continued Medical Work up  Expected Discharge Plan and Services In-house Referral: Clinical Social Work     Living arrangements for the past 2 months:  (homeless)                                       Social Determinants of Health (SDOH) Interventions SDOH Screenings   Food Insecurity: Food Insecurity Present (08/19/2023)  Housing: High Risk (08/19/2023)  Transportation Needs: Unmet Transportation Needs (08/19/2023)  Utilities: Not At Risk (08/19/2023)  Alcohol  Screen: Medium Risk (11/25/2020)  Depression (PHQ2-9): Low Risk  (11/06/2019)  Social Connections: Socially Isolated (07/20/2023)  Tobacco Use: High Risk (08/20/2023)    Readmission Risk Interventions     No data to display

## 2023-08-23 NOTE — Progress Notes (Signed)
 Patient ID: Jake Nguyen, male   DOB: 05-Aug-1965, 58 y.o.   MRN: [id number] The wound VAC dressing was removed.  The wound edges are well-approximated.  Begin dry dressing changes with an Ace wrap.  Change as needed.

## 2023-08-23 NOTE — Progress Notes (Signed)
 PROGRESS NOTE                                                                                                                                                                                                             Patient Demographics:    Jake Nguyen, is a 58 y.o. male, DOB - Aug 31, 1965, ZOX:[id number]  Outpatient Primary MD for the patient is Trenton Frock, FNP    LOS - 4  Admit date - 08/19/2023    Chief Complaint  Patient presents with   Wound Infection       Brief Narrative (HPI from H&P)    58 y.o. male with medical history significant for alcohol  use disorder, s/p left AKA, depression/anxiety, tobacco use, homelessness, and chronic right lower leg and foot infection who presented to the ED for evaluation of worsening pain and infection of right lower extremity.   Patient has prior history of left AKA due to infection.  He was last admitted on 4/10 for worsening of right lower extremity wound infection.  Case was discussed with Dr. Julio Ohm who was going to see patient to discuss possible right BKA however patient ultimately left AGAINST MEDICAL ADVICE on 4/14.  Patient returns with worsening pain, swelling, infectious appearance, and odor of his right foot.  His leg was covered in maggots on arrival.   He reports daily alcohol  use, maybe a 12 pack of beer daily.  He also reports smoking up to 1 pack of cigarettes daily.  He says he is living on the streets.  Further workup In the ER was suggestive of right foot gangrene and he was admitted for further care.     Subjective:   Patient in bed, appears comfortable, denies any complaints today, wound VAC has been discontinued this morning the surgeon, recommendation for wound care dressing change.   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   Right leg covered in maggots on arrival.  Was recommended BKA when admitted last month but he left AMA.  Was treated with empiric antibiotics  IV vancomycin , cefepime , Flagyl , far cultures are negative, underwent amputation on 08/20/2023, titrated down to oral doxycycline  on 08/22/2023, so far cultures negative continue to follow blood cultures, Dr. Julio Ohm  on board.  cultures remain negative, will DC doxycycline    Alcohol  use disorder with intoxication: Patient reports drinking around 12 beers daily.  He is intoxicated with serum ethanol 312 on admission.  High risk for withdrawal.  Placed on CIWA protocol with Ativan  as needed, Continue thiamine , folate, MVM -  Librium  taper -Decrease Librium  today to 5 mg 3 times daily.  Hypotension:  Borderline hypotensive on arrival - dehydration.  Received IV fluid bolus with improvement.  Continue with IV fluids   Hypokalemia: Will give p.o. supplements today  Hyponatremia -Hypovolemic, continue with IV fluids   Depression/anxiety:  Not taking any medications.  Gently stable, not suicidal or homicidal.   Tobacco use:  Patient reports smoking about 1 pack/day.  He declines nicotine  patch.  Counseled to quit.   Homelessness, extremely poor personal hygiene, had maggots in his right foot upon admission, extremely poor hygiene in the hospital: [facility] consult.  Nursing staff requested to keep him as clean as possible.      Condition -   Guarded  Family Communication  :  None  Code Status :  Full  Consults  :  Dr Julio Ohm  PUD Prophylaxis :  PPI   Procedures  :     Right BKA by Dr. Julio Ohm on 08/20/2023      Disposition Plan  :    Status is: Inpatient   DVT Prophylaxis  :    heparin  injection 5,000 Units Start: 08/20/23 2200    Lab Results  Component Value Date   PLT 192 08/23/2023    Diet :  Diet Order             Diet Heart Room service appropriate? Yes; Fluid consistency: Thin  Diet effective now                    Inpatient  Medications  Scheduled Meds:  vitamin C  1,000 mg Oral Daily   atorvastatin   40 mg Oral Daily   chlordiazePOXIDE   5 mg Oral TID   doxycycline   100 mg Oral Q12H   folic acid   1 mg Oral Daily   heparin  injection (subcutaneous)  5,000 Units Subcutaneous Q8H   multivitamin with minerals  1 tablet Oral Daily   nutrition supplement (JUVEN)  1 packet Oral BID BM   pantoprazole   40 mg Oral Daily   potassium chloride   40 mEq Oral Q6H   sodium chloride  flush  10-40 mL Intracatheter Q12H   sodium chloride  flush  3 mL Intravenous Q12H   thiamine   100 mg Oral Daily   Or   thiamine   100 mg Intravenous Daily   zinc  sulfate (50mg  elemental zinc )  220 mg Oral Daily   Continuous Infusions:  sodium chloride      sodium chloride      PRN Meds:.acetaminophen  **OR** acetaminophen , acetaminophen , ondansetron  **OR** ondansetron  (ZOFRAN ) IV, oxyCODONE , senna-docusate, sodium chloride  flush     Objective:   Vitals:   08/23/23 0531 08/23/23 0705 08/23/23 0805 08/23/23 0806  BP:   91/67 91/67  Pulse:      Resp:  18    Temp: 98.7 F (37.1 C)  98.5 F (36.9 C)   TempSrc: Oral  Oral   SpO2: 94% 95% 95%   Weight:      Height:        Wt Readings from Last 3 Encounters:  08/20/23 63.8 kg  01/23/23 63 kg  03/26/21 63.1 kg     Intake/Output Summary (Last 24 hours) at 08/23/2023  1136 Last data filed at 08/23/2023 1126 Gross per 24 hour  Intake 1188 ml  Output 2800 ml  Net -1612 ml     Physical Exam  Awake Alert, Oriented X 3, No new F.N deficits, Normal affect Symmetrical Chest wall movement, Good air movement bilaterally, CTAB RRR,No Gallops,Rubs or new Murmurs, No Parasternal Heave +ve B.Sounds, Abd Soft, No tenderness, No rebound - guarding or rigidity. No Cyanosis,Right BKA stump site looks clean, staples intact, please see pictures below, wound VAC has been discontinued, chronic left AKA       RN pressure injury documentation: Pressure Injury 11/25/20 Sacrum Mid;Upper Stage 2 -   Partial thickness loss of dermis presenting as a shallow open injury with a red, pink wound bed without slough. area of broken skin between buttocks just below sacrum. (Active)  11/25/20 0806  Location: Sacrum  Location Orientation: Mid;Upper  Staging: Stage 2 -  Partial thickness loss of dermis presenting as a shallow open injury with a red, pink wound bed without slough.  Wound Description (Comments): area of broken skin between buttocks just below sacrum.  Present on Admission:      Pressure Injury 03/19/21 Buttocks Left Stage 2 -  Partial thickness loss of dermis presenting as a shallow open injury with a red, pink wound bed without slough. pink red (Active)  03/19/21 2300  Location: Buttocks  Location Orientation: Left  Staging: Stage 2 -  Partial thickness loss of dermis presenting as a shallow open injury with a red, pink wound bed without slough.  Wound Description (Comments): pink red  Present on Admission: Yes     Pressure Injury 03/19/21 Buttocks Right Stage 2 -  Partial thickness loss of dermis presenting as a shallow open injury with a red, pink wound bed without slough. red, pink (Active)  03/19/21 2300  Location: Buttocks  Location Orientation: Right  Staging: Stage 2 -  Partial thickness loss of dermis presenting as a shallow open injury with a red, pink wound bed without slough.  Wound Description (Comments): red, pink  Present on Admission: Yes     Pressure Injury 07/21/23 Buttocks Left Stage 2 -  Partial thickness loss of dermis presenting as a shallow open injury with a red, pink wound bed without slough. area very dry, flaking.  Small open area (Active)  07/21/23 1000  Location: Buttocks  Location Orientation: Left  Staging: Stage 2 -  Partial thickness loss of dermis presenting as a shallow open injury with a red, pink wound bed without slough.  Wound Description (Comments): area very dry, flaking.  Small open area  Present on Admission: Yes (pt states it is not  new)  Dressing Type None 08/22/23 2030     Pressure Injury 07/21/23 Buttocks Right Stage 2 -  Partial thickness loss of dermis presenting as a shallow open injury with a red, pink wound bed without slough. area very dry, flaky.  Small area where skin had flaked off (Active)  07/21/23 1000  Location: Buttocks  Location Orientation: Right  Staging: Stage 2 -  Partial thickness loss of dermis presenting as a shallow open injury with a red, pink wound bed without slough.  Wound Description (Comments): area very dry, flaky.  Small area where skin had flaked off  Present on Admission: Yes (pt states it is not new)  Dressing Type None 08/22/23 2030      Data Review:    Recent Labs  Lab 08/19/23 1547 08/20/23 0631 08/21/23 0508 08/22/23 0426 08/23/23 0442  WBC  7.0 6.2 12.3* 10.6* 9.1  HGB 14.2 12.0* 12.3* 12.8* 12.7*  HCT 43.8 35.9* 36.4* 37.2* 37.5*  PLT 217 169 169 171 192  MCV 91.4 88.2 87.7 87.7 88.2  MCH 29.6 29.5 29.6 30.2 29.9  MCHC 32.4 33.4 33.8 34.4 33.9  RDW 14.5 14.2 14.0 14.0 13.8  LYMPHSABS 2.3  --  1.8 2.8 2.4  MONOABS 0.9  --  1.8* 1.5* 1.4*  EOSABS 0.3  --  0.0 0.1 0.2  BASOSABS 0.1  --  0.0 0.0 0.1    Recent Labs  Lab 08/19/23 1547 08/19/23 1605 08/20/23 0631 08/21/23 0508 08/22/23 0426 08/23/23 0442  NA 138  --  133* 133* 132* 131*  K 3.4*  --  3.8 3.4* 3.4* 3.3*  CL 100  --  100 97* 96* 98  CO2 24  --  23 26 27 27   ANIONGAP 14  --  10 10 9 6   GLUCOSE 105*  --  112* 124* 120* 128*  BUN <5*  --  <5* <5* 5* 5*  CREATININE 0.58*  --  0.60* 0.50* 0.50* 0.50*  AST 35  --   --   --   --   --   ALT 15  --   --   --   --   --   ALKPHOS 58  --   --   --   --   --   BILITOT 0.3  --   --   --   --   --   ALBUMIN 2.9*  --   --   --   --   --   CRP  --   --  0.9 3.6* 10.2* 10.6*  PROCALCITON  --   --  <0.10 <0.10 <0.10 0.13  LATICACIDVEN  --  3.1*  --   --   --   --   INR  --   --  1.1  --   --   --   BNP  --   --   --  67.0 32.8 17.4  MG  --   --  1.7 1.8  1.8 1.8  PHOS  --   --   --  3.7 3.6 3.8  CALCIUM  8.1*  --  7.8* 8.3* 8.1* 8.2*      Recent Labs  Lab 08/19/23 1547 08/19/23 1605 08/20/23 0631 08/21/23 0508 08/22/23 0426 08/23/23 0442  CRP  --   --  0.9 3.6* 10.2* 10.6*  PROCALCITON  --   --  <0.10 <0.10 <0.10 0.13  LATICACIDVEN  --  3.1*  --   --   --   --   INR  --   --  1.1  --   --   --   BNP  --   --   --  67.0 32.8 17.4  MG  --   --  1.7 1.8 1.8 1.8  CALCIUM  8.1*  --  7.8* 8.3* 8.1* 8.2*    --------------------------------------------------------------------------------------------------------------- Lab Results  Component Value Date   CHOL 105 07/31/2020   HDL 47 07/31/2020   LDLCALC 51 07/31/2020   TRIG 37 07/31/2020   CHOLHDL 2.2 07/31/2020    Lab Results  Component Value Date   HGBA1C 5.6 07/20/2023   No results for input(s): "TSH", "T4TOTAL", "FREET4", "T3FREE", "THYROIDAB" in the last 72 hours. No results for input(s): "VITAMINB12", "FOLATE", "FERRITIN", "TIBC", "IRON ", "RETICCTPCT" in the last 72 hours. ------------------------------------------------------------------------------------------------------------------ Cardiac Enzymes No results for input(s): "CKMB", "TROPONINI", "MYOGLOBIN" in the last  168 hours.  Invalid input(s): "CK"  Micro Results Recent Results (from the past 240 hours)  Blood culture (routine x 2)     Status: None (Preliminary result)   Collection Time: 08/19/23  2:46 PM   Specimen: BLOOD  Result Value Ref Range Status   Specimen Description BLOOD SITE NOT SPECIFIED  Final   Special Requests   Final    BOTTLES DRAWN AEROBIC ONLY Blood Culture results may not be optimal due to an inadequate volume of blood received in culture bottles   Culture   Final    NO GROWTH 4 DAYS Performed at [facility], [address], [place], [place] [address]    Report Status PENDING  Incomplete  Blood culture (routine x 2)     Status: None (Preliminary result)   Collection Time:  08/19/23  2:51 PM   Specimen: BLOOD  Result Value Ref Range Status   Specimen Description BLOOD SITE NOT SPECIFIED  Final   Special Requests   Final    BOTTLES DRAWN AEROBIC ONLY Blood Culture results may not be optimal due to an inadequate volume of blood received in culture bottles   Culture   Final    NO GROWTH 4 DAYS Performed at [facility], [address] [place], [place] [address]    Report Status PENDING  Incomplete    Radiology Report No results found.    Signature  -   Seena Dadds M.D on 08/23/2023 at 11:36 AM   -  To page go to [email or web address]

## 2023-08-24 ENCOUNTER — Other Ambulatory Visit (HOSPITAL_COMMUNITY): Payer: Self-pay

## 2023-08-24 LAB — BASIC METABOLIC PANEL WITH GFR
Anion gap: 9 (ref 5–15)
BUN: <5 mg/dL — ABNORMAL LOW (ref 6–20)
CO2: 26 mmol/L (ref 22–32)
Calcium: 8.2 mg/dL — ABNORMAL LOW (ref 8.9–10.3)
Chloride: 100 mmol/L (ref 98–111)
Creatinine, Ser: 0.61 mg/dL (ref 0.61–1.24)
GFR, Estimated: >60 mL/min (ref >60)
Glucose, Bld: 129 mg/dL — ABNORMAL HIGH (ref 70–99)
Potassium: 3.9 mmol/L (ref 3.5–5.1)
Sodium: 135 mmol/L (ref 135–145)

## 2023-08-24 LAB — CBC WITH DIFFERENTIAL/PLATELET
Abs Immature Granulocytes: 0.02 10*3/uL (ref 0.00–0.07)
Basophils Absolute: 0.1 10*3/uL (ref 0.0–0.1)
Basophils Relative: 1 %
Eosinophils Absolute: 0.3 10*3/uL (ref 0.0–0.5)
Eosinophils Relative: 4 %
HCT: 35.3 % — ABNORMAL LOW (ref 39.0–52.0)
Hemoglobin: 11.5 g/dL — ABNORMAL LOW (ref 13.0–17.0)
Immature Granulocytes: 0 %
Lymphocytes Relative: 35 %
Lymphs Abs: 2.5 10*3/uL (ref 0.7–4.0)
MCH: 29.3 pg (ref 26.0–34.0)
MCHC: 32.6 g/dL (ref 30.0–36.0)
MCV: 90.1 fL (ref 80.0–100.0)
Monocytes Absolute: 1.1 10*3/uL — ABNORMAL HIGH (ref 0.1–1.0)
Monocytes Relative: 16 %
Neutro Abs: 3.1 10*3/uL (ref 1.7–7.7)
Neutrophils Relative %: 44 %
Platelets: 211 10*3/uL (ref 150–400)
RBC: 3.92 MIL/uL — ABNORMAL LOW (ref 4.22–5.81)
RDW: 13.7 % (ref 11.5–15.5)
WBC: 7 10*3/uL (ref 4.0–10.5)
nRBC: 0 % (ref 0.0–0.2)

## 2023-08-24 LAB — CULTURE, BLOOD (ROUTINE X 2)
Culture: NO GROWTH
Culture: NO GROWTH

## 2023-08-24 MED ORDER — VITAMIN B-1 100 MG PO TABS
100.0000 mg | ORAL_TABLET | Freq: Every day | ORAL | Status: DC
Start: 2023-08-25 — End: 2023-09-29

## 2023-08-24 MED ORDER — OXYCODONE HCL 5 MG PO TABS
5.0000 mg | ORAL_TABLET | Freq: Four times a day (QID) | ORAL | 0 refills | Status: DC | PRN
  Filled 2023-08-24: qty 15, 2d supply, fill #0

## 2023-08-24 MED ORDER — CHLORDIAZEPOXIDE HCL 5 MG PO CAPS
ORAL_CAPSULE | ORAL | 0 refills | Status: AC
  Filled 2023-08-24: qty 12, 7d supply, fill #0

## 2023-08-24 MED ORDER — ATORVASTATIN CALCIUM 40 MG PO TABS
40.0000 mg | ORAL_TABLET | Freq: Every day | ORAL | 0 refills | Status: DC
  Filled 2023-08-24: qty 30, 30d supply, fill #0

## 2023-08-24 MED ORDER — ACETAMINOPHEN 325 MG PO TABS: 325.0000 mg | ORAL_TABLET | Freq: Four times a day (QID) | ORAL | Status: DC | PRN

## 2023-08-24 NOTE — Plan of Care (Signed)

## 2023-08-24 NOTE — Discharge Summary (Signed)
 Physician Discharge Summary  Jake Nguyen AVW:[id number] DOB: July 27, 1965 DOA: 08/19/2023  PCP: Trenton Frock, FNP  Admit date: 08/19/2023 Discharge date: 08/24/2023  [place] From: ([place] Disposition:  (Homeless, but reports he will be staying with friends for few days)  Recommendations for Outpatient Follow-up:  Follow up with PCP in 1-2 weeks Please obtain BMP/CBC in one week Patient instructed to follow with Dr. Julio Ohm in 1 week for wound check and stable check   Diet recommendation: Heart Healthy   Brief/Interim Summary:  58 y.o. male with medical history significant for alcohol  use disorder, s/p left AKA, depression/anxiety, tobacco use, homelessness, and chronic right lower leg and foot infection who presented to the ED for evaluation of worsening pain and infection of right lower extremity.   Patient has prior history of left AKA due to infection.  He was last admitted on 4/10 for worsening of right lower extremity wound infection.  Case was discussed with Dr. Julio Ohm who was going to see patient to discuss possible right BKA however patient ultimately left AGAINST MEDICAL ADVICE on 4/14.  Patient returns with worsening pain, swelling, infectious appearance, and odor of his right foot.  His leg was covered in maggots on arrival.   He reports daily alcohol  use, maybe a 12 pack of beer daily.  He also reports smoking up to 1 pack of cigarettes daily.  He says he is living on the streets.  Further workup In the ER was suggestive of right foot gangrene and he was admitted for further care.  Patient was seen by orthopedic, required BKA, please see discussion below  Gangrene of right lower extremity including foot with multiple wounds maggots in his right foot upon admission:   Chronic and worsening wounds.  Right leg covered in maggots on arrival.  Was recommended BKA when admitted last month but he left AMA.  Was treated with empiric antibiotics  IV vancomycin ,  cefepime , Flagyl , far cultures are negative, he was on p.o. doxycycline  postoperatively, all antibiotics discontinued as of yesterday as source of infection has been controlled with amputation.  . - Patient will be provided with shrinker, and Proshield before discharge, he was educated about wound care and dressing discharge, and he was given ample supply of bandages at time of discharge  Alcohol  use disorder with intoxication:  - On CIWA protocol, currently on Librium  taper,   Hypotension:  Borderline hypotensive on arrival - dehydration.  Received IV fluid bolus with improvement.  Resolved   Hypokalemia: Replaced   Hyponatremia -Hypovolemic, resolved with IV fluids   Depression/anxiety:  Not taking any medications.   not suicidal or homicidal.   Tobacco use:  Patient reports smoking about 1 pack/day.  He declines nicotine  patch.  Counseled to quit.   Homelessness, extremely poor personal hygiene, had maggots in his right foot upon admission, extremely poor hygiene in the hospital: [facility] consult.  Nursing staff requested to keep him as clean as possible.  Patient reports he will be staying with a friend for the next few days.   Discharge Diagnoses:  Principal Problem:   Gangrene of right foot (HCC) Active Problems:   Alcohol  use disorder, severe, dependence (HCC)   Hypotension   Hypokalemia   Depression with anxiety   Tobacco use    Discharge Instructions  Discharge Instructions     Diet - low sodium heart healthy   Complete by: As directed    Discharge instructions   Complete by: As directed    Follow  with Primary MD Trenton Frock, FNP in 7 days   Get CBC, CMP, checked  by Primary MD next visit.     Disposition Home    Diet: Heart Healthy   On your next visit with your primary care physician please Get Medicines reviewed and adjusted.   Please request your Prim.MD to go over all Hospital Tests and Procedure/Radiological results at the follow up, please get  all Hospital records sent to your Prim MD by signing hospital release before you go home.   If you experience worsening of your admission symptoms, develop shortness of breath, life threatening emergency, suicidal or homicidal thoughts you must seek medical attention immediately by calling 911 or calling your MD immediately  if symptoms less severe.  You Must read complete instructions/literature along with all the possible adverse reactions/side effects for all the Medicines you take and that have been prescribed to you. Take any new Medicines after you have completely understood and accpet all the possible adverse reactions/side effects.   Do not drive, operating heavy machinery, perform activities at heights, swimming or participation in water activities or provide baby sitting services if your were admitted for syncope or siezures until you have seen by Primary MD or a Neurologist and advised to do so again.  Do not drive when taking Pain medications.    Do not take more than prescribed Pain, Sleep and Anxiety Medications  Special Instructions: If you have smoked or chewed Tobacco  in the last 2 yrs please stop smoking, stop any regular Alcohol   and or any Recreational drug use.  Wear Seat belts while driving.   Please note  You were cared for by a hospitalist during your hospital stay. If you have any questions about your discharge medications or the care you received while you were in the hospital after you are discharged, you can call the unit and asked to speak with the hospitalist on call if the hospitalist that took care of you is not available. Once you are discharged, your primary care physician will handle any further medical issues. Please note that NO REFILLS for any discharge medications will be authorized once you are discharged, as it is imperative that you return to your primary care physician (or establish a relationship with a primary care physician if you do not have one)  for your aftercare needs so that they can reassess your need for medications and monitor your lab values.   Discharge wound care:   Complete by: As directed    No wound VAC for discharge. Dry dressing change daily, and as needed in between, ductions to wash leg with soap and water apply 4 x 4 gauze and Ace wrap.   Increase activity slowly   Complete by: As directed       Allergies as of 08/24/2023       Reactions   Pork-derived Products Other (See Comments)   Pt states he doesn't eat pork        Medication List     TAKE these medications    acetaminophen  325 MG tablet Commonly known as: TYLENOL  Take 1-2 tablets (325-650 mg total) by mouth every 6 (six) hours as needed for mild pain (pain score 1-3) (or temp > 100.5).   atorvastatin  40 MG tablet Commonly known as: LIPITOR Take 1 tablet (40 mg total) by mouth daily.   chlordiazePOXIDE  5 MG capsule Commonly known as: LIBRIUM  Please take 5 mg oral 3 times daily for 1 day, then 5 mg oral  2 times daily for 3 days, then 5 mg oral daily for 3 days then stop.   folic acid  1 MG tablet Commonly known as: FOLVITE  Take 1 tablet (1 mg total) by mouth daily.   multivitamin with minerals Tabs tablet Take 1 tablet by mouth daily.   oxyCODONE  5 MG immediate release tablet Commonly known as: Oxy IR/ROXICODONE  Take 1-2 tablets (5-10 mg total) by mouth every 6 (six) hours as needed for severe pain (pain score 7-10).   thiamine  100 MG tablet Commonly known as: Vitamin B-1 Take 1 tablet (100 mg total) by mouth daily. Start taking on: Aug 25, 2023               Durable Medical Equipment  (From admission, onward)           Start     Ordered   08/24/23 0932  For home use only DME Other see comment  Once       Comments: SLIDEBOARD  Question:  Length of Need  Answer:  Lifetime   08/24/23 0932   08/23/23 1204  For home use only DME Other see comment  Once       Comments: Right Elevating Leg Rest for Wheelchair  Question:   Length of Need  Answer:  Lifetime   08/23/23 1203   08/23/23 1203  For home use only DME Bedside commode  Once       Comments: DROP ARM  Question:  Patient needs a bedside commode to treat with the following condition  Answer:  S/P AKA (above knee amputation) bilateral (HCC)   08/23/23 1203              Discharge Care Instructions  (From admission, onward)           Start     Ordered   08/24/23 0000  Discharge wound care:       Comments: No wound VAC for discharge. Dry dressing change daily, and as needed in between, ductions to wash leg with soap and water apply 4 x 4 gauze and Ace wrap.   08/24/23 1255            Follow-up Information     Timothy Ford, MD Follow up in 1 week(s).   Specialty: Orthopedic Surgery Contact information: [address] [place]  [place] [address] [phone]         Marius Siemens, NP Follow up.   Specialty: Internal Medicine Why: TIME :  10:30 AM    PLEASE ARRIVE AT 10:00 AM DATE : JUNE 27 , 2025  PLEASE  BRING ALL MEDICATION, ID and INS CARD Contact information: [address] [place] [address] [phone]                Allergies  Allergen Reactions   Pork-Derived Products Other (See Comments)    Pt states he doesn't eat pork    Consultations: Orthopedic   Procedures/Studies: DG Foot 2 Views Right Result Date: 08/19/2023 CLINICAL DATA:  Right foot wound. EXAM: RIGHT FOOT - 2 VIEW COMPARISON:  01/19/2024 FINDINGS: Diffuse subcutaneous soft tissue swelling is noted but no gas is seen in the soft tissues. The bony structures are intact. No fracture or destructive bony changes to suggest osteomyelitis. No significant arthropathic findings. IMPRESSION: 1. Diffuse subcutaneous soft tissue swelling but no gas in the soft tissues. 2. No plain film findings for osteomyelitis. Electronically Signed   By: Marrian Siva M.D.   On: 08/19/2023 17:38  Subjective: No significant events overnight, he denies  any complaints today  Discharge Exam: Vitals:   08/24/23 0859 08/24/23 1127  BP: 126/79 108/74  Pulse:    Resp: 17 13  Temp: 98.3 F (36.8 C) 98.6 F (37 C)  SpO2: 99% 99%   Vitals:   08/23/23 1956 08/23/23 2330 08/24/23 0859 08/24/23 1127  BP: 104/77 96/63 126/79 108/74  Pulse: 99 89    Resp:   17 13  Temp: 98.4 F (36.9 C) 98.5 F (36.9 C) 98.3 F (36.8 C) 98.6 F (37 C)  TempSrc: Oral Oral Oral Oral  SpO2: 97% 98% 99% 99%  Weight:      Height:        General: Pt is alert, awake, not in acute distress Cardiovascular: RRR, S1/S2 +, no rubs, no gallops Respiratory: CTA bilaterally, no wheezing, no rhonchi Abdominal: Soft, NT, ND, bowel sounds + Extremities: Right BKA, bandaged, chronic left AKA.    The results of significant diagnostics from this hospitalization (including imaging, microbiology, ancillary and laboratory) are listed below for reference.     Microbiology: Recent Results (from the past 240 hours)  Blood culture (routine x 2)     Status: None   Collection Time: 08/19/23  2:46 PM   Specimen: BLOOD  Result Value Ref Range Status   Specimen Description BLOOD SITE NOT SPECIFIED  Final   Special Requests   Final    BOTTLES DRAWN AEROBIC ONLY Blood Culture results may not be optimal due to an inadequate volume of blood received in culture bottles   Culture   Final    NO GROWTH 5 DAYS Performed at [facility], [address], [place], [place] [address]    Report Status 08/24/2023 FINAL  Final  Blood culture (routine x 2)     Status: None   Collection Time: 08/19/23  2:51 PM   Specimen: BLOOD  Result Value Ref Range Status   Specimen Description BLOOD SITE NOT SPECIFIED  Final   Special Requests   Final    BOTTLES DRAWN AEROBIC ONLY Blood Culture results may not be optimal due to an inadequate volume of blood received in culture bottles   Culture   Final    NO GROWTH 5 DAYS Performed at [facility], [address], [place], [place]  [address]    Report Status 08/24/2023 FINAL  Final     Labs: BNP (last 3 results) Recent Labs    08/21/23 0508 08/22/23 0426 08/23/23 0442  BNP 67.0 32.8 17.4   Basic Metabolic Panel: Recent Labs  Lab 08/20/23 0631 08/21/23 0508 08/22/23 0426 08/23/23 0442 08/24/23 0319  NA 133* 133* 132* 131* 135  K 3.8 3.4* 3.4* 3.3* 3.9  CL 100 97* 96* 98 100  CO2 23 26 27 27 26   GLUCOSE 112* 124* 120* 128* 129*  BUN <5* <5* 5* 5* <5*  CREATININE 0.60* 0.50* 0.50* 0.50* 0.61  CALCIUM  7.8* 8.3* 8.1* 8.2* 8.2*  MG 1.7 1.8 1.8 1.8  --   PHOS  --  3.7 3.6 3.8  --    Liver Function Tests: Recent Labs  Lab 08/19/23 1547  AST 35  ALT 15  ALKPHOS 58  BILITOT 0.3  PROT 7.7  ALBUMIN 2.9*   No results for input(s): "LIPASE", "AMYLASE" in the last 168 hours. No results for input(s): "AMMONIA" in the last 168 hours. CBC: Recent Labs  Lab 08/19/23 1547 08/20/23 0631 08/21/23 4034 08/22/23 7425 08/23/23 9563 08/24/23 0319  WBC 7.0 6.2 12.3* 10.6* 9.1 7.0  NEUTROABS 3.3  --  8.6* 6.1 5.1 3.1  HGB 14.2 12.0* 12.3* 12.8* 12.7* 11.5*  HCT 43.8 35.9* 36.4* 37.2* 37.5* 35.3*  MCV 91.4 88.2 87.7 87.7 88.2 90.1  PLT 217 169 169 171 192 211   Cardiac Enzymes: No results for input(s): "CKTOTAL", "CKMB", "CKMBINDEX", "TROPONINI" in the last 168 hours. BNP: Invalid input(s): "POCBNP" CBG: No results for input(s): "GLUCAP" in the last 168 hours. D-Dimer No results for input(s): "DDIMER" in the last 72 hours. Hgb A1c No results for input(s): "HGBA1C" in the last 72 hours. Lipid Profile No results for input(s): "CHOL", "HDL", "LDLCALC", "TRIG", "CHOLHDL", "LDLDIRECT" in the last 72 hours. Thyroid  function studies No results for input(s): "TSH", "T4TOTAL", "T3FREE", "THYROIDAB" in the last 72 hours.  Invalid input(s): "FREET3" Anemia work up No results for input(s): "VITAMINB12", "FOLATE", "FERRITIN", "TIBC", "IRON ", "RETICCTPCT" in the last 72 hours. Urinalysis    Component Value  Date/Time   COLORURINE YELLOW 03/19/2021 0101   APPEARANCEUR CLEAR 03/19/2021 0101   LABSPEC 1.012 03/19/2021 0101   PHURINE 5.0 03/19/2021 0101   GLUCOSEU 50 (A) 03/19/2021 0101   HGBUR NEGATIVE 03/19/2021 0101   BILIRUBINUR NEGATIVE 03/19/2021 0101   KETONESUR 5 (A) 03/19/2021 0101   PROTEINUR NEGATIVE 03/19/2021 0101   UROBILINOGEN 0.2 08/02/2009 2144   NITRITE NEGATIVE 03/19/2021 0101   LEUKOCYTESUR NEGATIVE 03/19/2021 0101   Sepsis Labs Recent Labs  Lab 08/21/23 0508 08/22/23 0426 08/23/23 0442 08/24/23 0319  WBC 12.3* 10.6* 9.1 7.0   Microbiology Recent Results (from the past 240 hours)  Blood culture (routine x 2)     Status: None   Collection Time: 08/19/23  2:46 PM   Specimen: BLOOD  Result Value Ref Range Status   Specimen Description BLOOD SITE NOT SPECIFIED  Final   Special Requests   Final    BOTTLES DRAWN AEROBIC ONLY Blood Culture results may not be optimal due to an inadequate volume of blood received in culture bottles   Culture   Final    NO GROWTH 5 DAYS Performed at [facility], [address] [place], [place] [address]    Report Status 08/24/2023 FINAL  Final  Blood culture (routine x 2)     Status: None   Collection Time: 08/19/23  2:51 PM   Specimen: BLOOD  Result Value Ref Range Status   Specimen Description BLOOD SITE NOT SPECIFIED  Final   Special Requests   Final    BOTTLES DRAWN AEROBIC ONLY Blood Culture results may not be optimal due to an inadequate volume of blood received in culture bottles   Culture   Final    NO GROWTH 5 DAYS Performed at [facility], [address] [place], [place] [address]    Report Status 08/24/2023 FINAL  Final     Time coordinating discharge: Over 30 minutes  SIGNED:   Seena Dadds, MD  [facility] 08/24/2023, 12:57 PM Pager   If 7PM-7AM, please contact night-coverage [email or web address]

## 2023-08-24 NOTE — Progress Notes (Signed)
 Orthopedic Tech Progress Note Patient Details:  Jake Nguyen February 18, 1966 [id number]  Order for a R BK ampushield with shrinker and education called into [facility].  Patient ID: Jake Nguyen, male   DOB: 03/05/1966, 58 y.o.   MRN: [id number]  Jake Nguyen 08/24/2023, 11:31 AM

## 2023-08-24 NOTE — Discharge Instructions (Signed)
 Follow with Primary MD Trenton Frock, FNP in 7 days   Get CBC, CMP, checked  by Primary MD next visit.     Disposition Home    Diet: Heart Healthy   On your next visit with your primary care physician please Get Medicines reviewed and adjusted.   Please request your Prim.MD to go over all Hospital Tests and Procedure/Radiological results at the follow up, please get all Hospital records sent to your Prim MD by signing hospital release before you go home.   If you experience worsening of your admission symptoms, develop shortness of breath, life threatening emergency, suicidal or homicidal thoughts you must seek medical attention immediately by calling 911 or calling your MD immediately  if symptoms less severe.  You Must read complete instructions/literature along with all the possible adverse reactions/side effects for all the Medicines you take and that have been prescribed to you. Take any new Medicines after you have completely understood and accpet all the possible adverse reactions/side effects.   Do not drive, operating heavy machinery, perform activities at heights, swimming or participation in water activities or provide baby sitting services if your were admitted for syncope or siezures until you have seen by Primary MD or a Neurologist and advised to do so again.  Do not drive when taking Pain medications.    Do not take more than prescribed Pain, Sleep and Anxiety Medications  Special Instructions: If you have smoked or chewed Tobacco  in the last 2 yrs please stop smoking, stop any regular Alcohol   and or any Recreational drug use.  Wear Seat belts while driving.   Please note  You were cared for by a hospitalist during your hospital stay. If you have any questions about your discharge medications or the care you received while you were in the hospital after you are discharged, you can call the unit and asked to speak with the hospitalist on call if the  hospitalist that took care of you is not available. Once you are discharged, your primary care physician will handle any further medical issues. Please note that NO REFILLS for any discharge medications will be authorized once you are discharged, as it is imperative that you return to your primary care physician (or establish a relationship with a primary care physician if you do not have one) for your aftercare needs so that they can reassess your need for medications and monitor your lab values.

## 2023-08-24 NOTE — Progress Notes (Signed)
 Physical Therapy Treatment Patient Details Name: ARLYNN VEASLEY MRN: [id number] DOB: 01-18-1966 Today's Date: 08/24/2023   History of Present Illness 58 y.o. male admitted 08/19/23 with worsening RLE pain and infection, noted maggots and gangrene; S/p Rt BKA 5/11.  PMH includes L AKA (07/2020), PAD, anxiety, ETOH abuse, tobacco abuse, homelessness.    PT Comments  Agreeable to bed level activities only. Reviewed precautions post-op BKA. Reviewed and provided handouts for limb wrapping, and LE exercises. Discussed safety with mobility, demonstrating techniques and verbally reviewing transfers with sliding board. Declines to physically practice. Patient will continue to benefit from skilled physical therapy services to further improve independence with functional mobility.     If plan is discharge home, recommend the following: A little help with walking and/or transfers;A little help with bathing/dressing/bathroom;Assistance with cooking/housework;Assist for transportation;Help with stairs or ramp for entrance   Can travel by private vehicle     No  Equipment Recommendations  Other (comment) (Right elevating leg rest; sliding board*)    Recommendations for Other Services       Precautions / Restrictions Precautions Precautions: Fall;Knee Precaution Booklet Issued: Yes (comment) (Reviewed BKA precautions) Recall of Precautions/Restrictions: Impaired Precaution/Restrictions Comments: RLE wound vac Restrictions Weight Bearing Restrictions Per Provider Order: No RLE Weight Bearing Per Provider Order: Non weight bearing Other Position/Activity Restrictions: hx of L AKA     Mobility  Bed Mobility Overal bed mobility: Needs Assistance Bed Mobility: Supine to Sit     Supine to sit: Supervision     General bed mobility comments: supine to long sit with HOB elevated.    Transfers                   General transfer comment: declined    Ambulation/Gait                    Stairs             Wheelchair Mobility     Tilt Bed    Modified Rankin (Stroke Patients Only)       Balance                                            Communication Communication Communication: No apparent difficulties  Cognition Arousal: Alert Behavior During Therapy: WFL for tasks assessed/performed   PT - Cognitive impairments: No apparent impairments                         Following commands: Intact      Cueing Cueing Techniques: Verbal cues, Gestural cues  Exercises Amputee Exercises Quad Sets: Strengthening, Right, 10 reps, Seated Gluteal Sets: Strengthening, Both, 10 reps, Seated Towel Squeeze: Strengthening, Both, 10 reps, Supine Hip Extension:  (Reviewed) Hip ABduction/ADduction: Strengthening, Both, 10 reps, Supine Hip Flexion/Marching: Strengthening, 5 reps, Supine, Right Knee Flexion: AROM, Right, 5 reps, Supine Knee Extension: AROM, Right, 5 reps, Supine Straight Leg Raises: Strengthening, Right, 5 reps, Supine    General Comments General comments (skin integrity, edema, etc.): Reviewed precautions. Educated on limb wrapping with handout provided. HEP reviewed and handout provided.      Pertinent Vitals/Pain Pain Assessment Pain Assessment: Faces Faces Pain Scale: Hurts little more Pain Location: R residual limb Pain Descriptors / Indicators: Grimacing, Guarding, Sore    Home Living  Prior Function            PT Goals (current goals can now be found in the care plan section) Acute Rehab PT Goals Patient Stated Goal: Get well PT Goal Formulation: With patient Time For Goal Achievement: 09/04/23 Potential to Achieve Goals: Good Progress towards PT goals: Progressing toward goals    Frequency    Min 2X/week      PT Plan      Co-evaluation              AM-PAC PT "6 Clicks" Mobility   Outcome Measure  Help needed turning from your back to your  side while in a flat bed without using bedrails?: A Little Help needed moving from lying on your back to sitting on the side of a flat bed without using bedrails?: A Little Help needed moving to and from a bed to a chair (including a wheelchair)?: A Little Help needed standing up from a chair using your arms (e.g., wheelchair or bedside chair)?: Total Help needed to walk in hospital room?: Total Help needed climbing 3-5 steps with a railing? : Total 6 Click Score: 12    End of Session Equipment Utilized During Treatment: Gait belt Activity Tolerance: Patient tolerated treatment well Patient left: with call bell/phone within reach;in bed;with bed alarm set (knee extended)   PT Visit Diagnosis: Muscle weakness (generalized) (M62.81);Difficulty in walking, not elsewhere classified (R26.2);Pain Pain - Right/Left: Right Pain - part of body: Leg     Time: 4098-1191 PT Time Calculation (min) (ACUTE ONLY): 9 min  Charges:    $Therapeutic Exercise: 8-22 mins PT General Charges $$ ACUTE PT VISIT: 1 Visit                     Jory Ng, PT, DPT [facility]  Rehabilitation Services Physical Therapist Office: [phone] Website: [email or web address]    Alinda Irani 08/24/2023, 11:44 AM

## 2023-08-24 NOTE — Progress Notes (Signed)
 Occupational Therapy Treatment Patient Details Name: Jake Nguyen MRN: [id number] DOB: 1965/08/16 Today's Date: 08/24/2023   History of present illness 58 y.o. male admitted 08/19/23 with worsening RLE pain and infection, noted maggots and gangrene; S/p Rt BKA 5/11.  PMH includes L AKA (07/2020), PAD, anxiety, ETOH abuse, tobacco abuse, homelessness.   OT comments  Pt making good progress with functional goals. Pt ready to d/c today, declined SNF. Pt CGA to transfer to w/c and declined use of SB and did not flip armrest back. Pt able to complete ADLs with Set - CGA using compensatory strategies for LB ADLs. Pt educated on ADL and ADL mobility safety using safe and appropriate techniques.       If plan is discharge home, recommend the following:  A little help with bathing/dressing/bathroom;A little help with walking and/or transfers;Assistance with cooking/housework;Assist for transportation   Equipment Recommendations  Other (comment) (drop arm BSC)    Recommendations for Other Services      Precautions / Restrictions Precautions Precautions: Fall;Knee Precaution Booklet Issued: Yes (comment) Recall of Precautions/Restrictions: Impaired Restrictions Weight Bearing Restrictions Per Provider Order: No RLE Weight Bearing Per Provider Order: Non weight bearing Other Position/Activity Restrictions: hx of L AKA       Mobility Bed Mobility Overal bed mobility: Needs Assistance Bed Mobility: Supine to Sit                Transfers Overall transfer level: Needs assistance Equipment used: Sliding board Transfers: Bed to chair/wheelchair/BSC            Lateral/Scoot Transfers: Contact guard assist General transfer comment: pt declining use of SB, nor did he want to flip armest back for lateral scoot. pt has new SB delivered to room before d/c today     Balance Overall balance assessment: Needs assistance Sitting-balance support: Single extremity supported Sitting  balance-Leahy Scale: Fair                                     ADL either performed or assessed with clinical judgement   ADL Overall ADL's : Needs assistance/impaired     Grooming: Wash/dry hands;Wash/dry face;Oral care;Set up;Sitting           Upper Body Dressing : Set up;Sitting   Lower Body Dressing: Contact guard assist;Sitting/lateral leans   Toilet Transfer: Contact guard assist Toilet Transfer Details (indicate cue type and reason): simulated to w/c         Functional mobility during ADLs: Supervision/safety General ADL Comments: pt has new SB delivered to room before d/c today    Extremity/Trunk Assessment Upper Extremity Assessment Upper Extremity Assessment: Overall WFL for tasks assessed   Lower Extremity Assessment Lower Extremity Assessment: Defer to PT evaluation        Vision Ability to See in Adequate Light: 0 Adequate Patient Visual Report: No change from baseline     Perception     Praxis     Communication Communication Communication: No apparent difficulties   Cognition Arousal: Alert Behavior During Therapy: WFL for tasks assessed/performed               OT - Cognition Comments: poor safety awareness, pt transferring to w/c without flipping armrest back or using SB                 Following commands: Intact        Cueing   Cueing Techniques: Verbal  cues, Gestural cues  Exercises      Shoulder Instructions       General Comments Reviewed precautions. Educated on limb wrapping with handout provided. HEP reviewed and handout provided.    Pertinent Vitals/ Pain       Pain Assessment Pain Assessment: No/denies pain Faces Pain Scale: No hurt Pain Intervention(s): Monitored during session  Home Living                                          Prior Functioning/Environment              Frequency  Min 2X/week        Progress Toward Goals  OT Goals(current goals can now  be found in the care plan section)  Progress towards OT goals: Progressing toward goals     Plan      Co-evaluation                 AM-PAC OT "6 Clicks" Daily Activity     Outcome Measure   Help from another person eating meals?: None Help from another person taking care of personal grooming?: None Help from another person toileting, which includes using toliet, bedpan, or urinal?: A Little Help from another person bathing (including washing, rinsing, drying)?: A Little Help from another person to put on and taking off regular upper body clothing?: A Little Help from another person to put on and taking off regular lower body clothing?: A Little 6 Click Score: 20    End of Session Equipment Utilized During Treatment: Gait belt;Other (comment) (w/c, SB)  OT Visit Diagnosis: Other abnormalities of gait and mobility (R26.89)   Activity Tolerance Patient tolerated treatment well   Patient Left in chair   Nurse Communication Mobility status        Time: 1610-9604 OT Time Calculation (min): 18 min  Charges: OT General Charges $OT Visit: 1 Visit OT Treatments $Self Care/Home Management : 8-22 mins    Alfred Ann 08/24/2023, 2:53 PM

## 2023-08-24 NOTE — TOC Transition Note (Addendum)
 Transition of Care [facility]) - Discharge Note   Patient Details  Name: Jake Nguyen MRN: [id number] Date of Birth: 02-Aug-1965  Transition of Care [facility]) CM/SW Contact:  Eusebio High, RN Phone Number: 08/24/2023, 2:08 PM   Clinical Narrative:     Patient dcing today to home which he states is the [facility] [address] [facility] [address] [place] Patient  has all DME in room. [employer] has been called and driver will call Nurse's station when he is ready. [employer] will pick up in DC lounge         Barriers to Discharge: Continued Medical Work up   Patient Goals and CMS Choice Patient states their goals for this hospitalization and ongoing recovery are:: to return home   Choice offered to / list presented to : Patient      Discharge Placement                       Discharge Plan and Services Additional resources added to the After Visit Summary for   In-house Referral: Clinical Social Work                                   Social Drivers of Health (SDOH) Interventions SDOH Screenings   Food Insecurity: Food Insecurity Present (08/19/2023)  Housing: High Risk (08/19/2023)  Transportation Needs: Unmet Transportation Needs (08/19/2023)  Utilities: Not At Risk (08/19/2023)  Alcohol  Screen: Medium Risk (11/25/2020)  Depression (PHQ2-9): Low Risk  (11/06/2019)  Social Connections: Socially Isolated (07/20/2023)  Tobacco Use: High Risk (08/20/2023)     Readmission Risk Interventions     No data to display

## 2023-08-24 NOTE — Progress Notes (Signed)
 Patient left unit via wheel chair with RN. Meds picked up from pharmacy. Commode, leg brace and sliding board all left with patient. No c/o chest pain or SOB.

## 2023-08-24 NOTE — TOC Progression Note (Addendum)
 Transition of Care [facility]) - Progression Note    Patient Details  Name: Jake Nguyen MRN: [id number] Date of Birth: 08-27-1965  Transition of Care [facility]) CM/SW Contact  Eusebio High, RN Phone Number: 08/24/2023, 9:24 AM  Clinical Narrative:     Update: 9:28 AM UPDATE. RNCM was able to reach patient's Sister. Cheryl @ [phone]. [name] said she will call Brother but if she cannot reach him she has a friend that she will call to pick up patient. RNCM will call Sister back after progression today with a DC time    RNCM attempted to contact patient's brother, Langston Metter. Phone went straight to voice mail- Message and call back number left. Would like to see if brother can pick up patient at discharge     Expected Discharge Plan: Home/Self Care Barriers to Discharge: Continued Medical Work up  Expected Discharge Plan and Services In-house Referral: Clinical Social Work     Living arrangements for the past 2 months:  (homeless)                                       Social Determinants of Health (SDOH) Interventions SDOH Screenings   Food Insecurity: Food Insecurity Present (08/19/2023)  Housing: High Risk (08/19/2023)  Transportation Needs: Unmet Transportation Needs (08/19/2023)  Utilities: Not At Risk (08/19/2023)  Alcohol  Screen: Medium Risk (11/25/2020)  Depression (PHQ2-9): Low Risk  (11/06/2019)  Social Connections: Socially Isolated (07/20/2023)  Tobacco Use: High Risk (08/20/2023)    Readmission Risk Interventions     No data to display

## 2023-09-03 ENCOUNTER — Encounter (HOSPITAL_COMMUNITY): Payer: Self-pay

## 2023-09-03 ENCOUNTER — Emergency Department (HOSPITAL_COMMUNITY): Payer: MEDICAID

## 2023-09-03 ENCOUNTER — Other Ambulatory Visit: Payer: Self-pay

## 2023-09-03 ENCOUNTER — Inpatient Hospital Stay (HOSPITAL_COMMUNITY)
Admission: EM | Admit: 2023-09-03 | Discharge: 2023-09-09 | DRG: 565 | Disposition: A | Discharge status: Home / Self Care | Payer: MEDICAID | Attending: Family Medicine | Admitting: Family Medicine

## 2023-09-03 DIAGNOSIS — Z79899 Other long term (current) drug therapy: Secondary | ICD-10-CM

## 2023-09-03 DIAGNOSIS — F10239 Alcohol dependence with withdrawal, unspecified: Secondary | ICD-10-CM | POA: Diagnosis not present

## 2023-09-03 DIAGNOSIS — F10220 Alcohol dependence with intoxication, uncomplicated: Secondary | ICD-10-CM | POA: Diagnosis present

## 2023-09-03 DIAGNOSIS — Z89512 Acquired absence of left leg below knee: Secondary | ICD-10-CM

## 2023-09-03 DIAGNOSIS — Z720 Tobacco use: Secondary | ICD-10-CM

## 2023-09-03 DIAGNOSIS — F418 Other specified anxiety disorders: Secondary | ICD-10-CM | POA: Diagnosis present

## 2023-09-03 DIAGNOSIS — Z5900 Homelessness unspecified: Secondary | ICD-10-CM

## 2023-09-03 DIAGNOSIS — F419 Anxiety disorder, unspecified: Secondary | ICD-10-CM | POA: Diagnosis present

## 2023-09-03 DIAGNOSIS — Y908 Blood alcohol level of 240 mg/100 ml or more: Secondary | ICD-10-CM | POA: Diagnosis present

## 2023-09-03 DIAGNOSIS — E785 Hyperlipidemia, unspecified: Secondary | ICD-10-CM | POA: Diagnosis present

## 2023-09-03 DIAGNOSIS — E876 Hypokalemia: Secondary | ICD-10-CM | POA: Diagnosis not present

## 2023-09-03 DIAGNOSIS — F1020 Alcohol dependence, uncomplicated: Secondary | ICD-10-CM | POA: Diagnosis present

## 2023-09-03 DIAGNOSIS — I739 Peripheral vascular disease, unspecified: Secondary | ICD-10-CM | POA: Diagnosis present

## 2023-09-03 DIAGNOSIS — Z89511 Acquired absence of right leg below knee: Secondary | ICD-10-CM

## 2023-09-03 DIAGNOSIS — L089 Local infection of the skin and subcutaneous tissue, unspecified: Secondary | ICD-10-CM | POA: Diagnosis present

## 2023-09-03 DIAGNOSIS — E860 Dehydration: Secondary | ICD-10-CM | POA: Diagnosis present

## 2023-09-03 DIAGNOSIS — Z89612 Acquired absence of left leg above knee: Secondary | ICD-10-CM

## 2023-09-03 DIAGNOSIS — F32A Depression, unspecified: Secondary | ICD-10-CM | POA: Diagnosis present

## 2023-09-03 DIAGNOSIS — Z91014 Allergy to mammalian meats: Secondary | ICD-10-CM

## 2023-09-03 DIAGNOSIS — L309 Dermatitis, unspecified: Secondary | ICD-10-CM | POA: Diagnosis present

## 2023-09-03 DIAGNOSIS — T8140XA Infection following a procedure, unspecified, initial encounter: Principal | ICD-10-CM

## 2023-09-03 DIAGNOSIS — F10920 Alcohol use, unspecified with intoxication, uncomplicated: Secondary | ICD-10-CM

## 2023-09-03 DIAGNOSIS — Y835 Amputation of limb(s) as the cause of abnormal reaction of the patient, or of later complication, without mention of misadventure at the time of the procedure: Secondary | ICD-10-CM | POA: Diagnosis present

## 2023-09-03 DIAGNOSIS — T8781 Dehiscence of amputation stump: Principal | ICD-10-CM | POA: Diagnosis present

## 2023-09-03 LAB — CBC WITH DIFFERENTIAL/PLATELET
Abs Immature Granulocytes: 0.02 10*3/uL (ref 0.00–0.07)
Basophils Absolute: 0.1 10*3/uL (ref 0.0–0.1)
Basophils Relative: 2 %
Eosinophils Absolute: 0.2 10*3/uL (ref 0.0–0.5)
Eosinophils Relative: 3 %
HCT: 40.8 % (ref 39.0–52.0)
Hemoglobin: 13.3 g/dL (ref 13.0–17.0)
Immature Granulocytes: 0 %
Lymphocytes Relative: 40 %
Lymphs Abs: 2.8 10*3/uL (ref 0.7–4.0)
MCH: 29.3 pg (ref 26.0–34.0)
MCHC: 32.6 g/dL (ref 30.0–36.0)
MCV: 89.9 fL (ref 80.0–100.0)
Monocytes Absolute: 0.5 10*3/uL (ref 0.1–1.0)
Monocytes Relative: 7 %
Neutro Abs: 3.3 10*3/uL (ref 1.7–7.7)
Neutrophils Relative %: 48 %
Platelets: 285 10*3/uL (ref 150–400)
RBC: 4.54 MIL/uL (ref 4.22–5.81)
RDW: 14.1 % (ref 11.5–15.5)
WBC: 6.9 10*3/uL (ref 4.0–10.5)
nRBC: 0 % (ref 0.0–0.2)

## 2023-09-03 LAB — PROTIME-INR
INR: 1 (ref 0.8–1.2)
Prothrombin Time: 13.1 s (ref 11.4–15.2)

## 2023-09-03 LAB — COMPREHENSIVE METABOLIC PANEL WITH GFR
ALT: 19 U/L (ref 0–44)
AST: 43 U/L — ABNORMAL HIGH (ref 15–41)
Albumin: 2.9 g/dL — ABNORMAL LOW (ref 3.5–5.0)
Alkaline Phosphatase: 67 U/L (ref 38–126)
Anion gap: 13 (ref 5–15)
BUN: <5 mg/dL — ABNORMAL LOW (ref 6–20)
CO2: 23 mmol/L (ref 22–32)
Calcium: 8.5 mg/dL — ABNORMAL LOW (ref 8.9–10.3)
Chloride: 100 mmol/L (ref 98–111)
Creatinine, Ser: 0.59 mg/dL — ABNORMAL LOW (ref 0.61–1.24)
GFR, Estimated: >60 mL/min (ref >60)
Glucose, Bld: 108 mg/dL — ABNORMAL HIGH (ref 70–99)
Potassium: 4.9 mmol/L (ref 3.5–5.1)
Sodium: 136 mmol/L (ref 135–145)
Total Bilirubin: 1.2 mg/dL (ref 0.0–1.2)
Total Protein: 8.3 g/dL — ABNORMAL HIGH (ref 6.5–8.1)

## 2023-09-03 LAB — ETHANOL: Alcohol, Ethyl (B): 310 mg/dL (ref <15)

## 2023-09-03 LAB — I-STAT CG4 LACTIC ACID, ED: Lactic Acid, Venous: 2.4 mmol/L (ref 0.5–1.9)

## 2023-09-03 MED ORDER — VANCOMYCIN HCL IN DEXTROSE 1-5 GM/200ML-% IV SOLN: 1000.0000 mg | Freq: Once | INTRAVENOUS | Status: DC

## 2023-09-03 MED ORDER — LACTATED RINGERS IV BOLUS
1000.0000 mL | Freq: Once | INTRAVENOUS | Status: AC
  Administered 2023-09-03: 1000 mL via INTRAVENOUS

## 2023-09-03 MED ORDER — VANCOMYCIN HCL 1500 MG/300ML IV SOLN
1500.0000 mg | Freq: Once | INTRAVENOUS | Status: AC
  Administered 2023-09-03: 1500 mg via INTRAVENOUS
  Filled 2023-09-03: qty 300

## 2023-09-03 NOTE — ED Triage Notes (Addendum)
 Pt BIB PTAR from street for R leg pain and malodorous wound drainage. Pt BIL LE amputee. R BKA 2 weeks ago. +ETOH 134/p 78HR

## 2023-09-03 NOTE — ED Provider Notes (Signed)
 [place] EMERGENCY DEPARTMENT AT [facility] Provider Note   CSN: [id number] Arrival date & time: 09/03/23  2101     History  Chief Complaint  Patient presents with   Leg Pain    EMRE STOCK is a 58 y.o. male.  Patient is a's 58 year old male with a past medical history of alcohol  use disorder and homelessness presenting to the emergency department with concern of postop infection.  Patient was admitted to the hospital on 5/10 and had BKA of his right lower extremity on 5/11 for wound infection.  He was discharged without antibiotics.  He states for about the last week he has had increased pain, redness and drainage from his surgical site.  He states he has had no fevers.  Denies any nausea or vomiting.  Of note he does report drinking alcohol  tonight and states that he is a daily drinker.  The history is provided by the patient.  Leg Pain      Home Medications Prior to Admission medications   Medication Sig Start Date End Date Taking? Authorizing Provider  acetaminophen  (TYLENOL ) 325 MG tablet Take 1-2 tablets (325-650 mg total) by mouth every 6 (six) hours as needed for mild pain (pain score 1-3) (or temp > 100.5). 08/24/23   Elgergawy, Ardia Kraft, MD  atorvastatin  (LIPITOR) 40 MG tablet Take 1 tablet (40 mg total) by mouth daily. 08/24/23   Elgergawy, Ardia Kraft, MD  folic acid  (FOLVITE ) 1 MG tablet Take 1 tablet (1 mg total) by mouth daily. Patient not taking: Reported on 11/19/2020 08/27/20   Lovell Rubenstein, NP  Multiple Vitamin (MULTIVITAMIN WITH MINERALS) TABS tablet Take 1 tablet by mouth daily. Patient not taking: Reported on 03/19/2021 11/27/20   Verlyn Goad, MD  oxyCODONE  (OXY IR/ROXICODONE ) 5 MG immediate release tablet Take 1-2 tablets (5-10 mg total) by mouth every 6 (six) hours as needed for severe pain (pain score 7-10). 08/24/23   Elgergawy, Ardia Kraft, MD  thiamine  (VITAMIN B-1) 100 MG tablet Take 1 tablet (100 mg total) by mouth daily. 08/25/23    Elgergawy, Ardia Kraft, MD      Allergies    Pork-derived products    Review of Systems   Review of Systems  Physical Exam Updated Vital Signs BP 106/77 (BP Location: Right Arm)   Pulse 84   Temp 98.1 F (36.7 C) (Oral)   Resp (!) 21   Wt 63.8 kg   SpO2 100%   BMI 20.18 kg/m  Physical Exam Vitals and nursing note reviewed.  Constitutional:      Comments: Drowsy but arousable to noxious stimulation, acutely intoxicated appearing  HENT:     Head: Normocephalic and atraumatic.     Nose: Nose normal.     Mouth/Throat:     Mouth: Mucous membranes are moist.  Eyes:     Extraocular Movements: Extraocular movements intact.     Conjunctiva/sclera: Conjunctivae normal.  Cardiovascular:     Rate and Rhythm: Normal rate and regular rhythm.     Heart sounds: Normal heart sounds.  Pulmonary:     Effort: Pulmonary effort is normal.     Breath sounds: Normal breath sounds.  Abdominal:     General: Abdomen is flat.     Palpations: Abdomen is soft.     Tenderness: There is no abdominal tenderness.  Musculoskeletal:     Cervical back: Normal range of motion.     Comments: Bilateral lower extremity BKA  Skin:    General: Skin  is warm.     Comments: Staples in place on right BKA at postop incision, no active drainage but surrounding erythema and warmth to touch, mild swelling and tenderness to touch  Neurological:     General: No focal deficit present.     Mental Status: He is oriented to person, place, and time.  Psychiatric:        Mood and Affect: Mood normal.        Behavior: Behavior normal.     ED Results / Procedures / Treatments   Labs (all labs ordered are listed, but only abnormal results are displayed) Labs Reviewed  COMPREHENSIVE METABOLIC PANEL WITH GFR - Abnormal; Notable for the following components:      Result Value   Glucose, Bld 108 (*)    BUN <5 (*)    Creatinine, Ser 0.59 (*)    Calcium  8.5 (*)    Total Protein 8.3 (*)    Albumin 2.9 (*)    AST 43 (*)     All other components within normal limits  ETHANOL - Abnormal; Notable for the following components:   Alcohol , Ethyl (B) 310 (*)    All other components within normal limits  I-STAT CG4 LACTIC ACID, ED - Abnormal; Notable for the following components:   Lactic Acid, Venous 2.4 (*)    All other components within normal limits  CULTURE, BLOOD (ROUTINE X 2)  CULTURE, BLOOD (ROUTINE X 2)  CBC WITH DIFFERENTIAL/PLATELET  PROTIME-INR  I-STAT CG4 LACTIC ACID, ED    EKG EKG Interpretation Date/Time:  Sunday Sep 03 2023 23:20:44 EDT Ventricular Rate:  96 PR Interval:  179 QRS Duration:  91 QT Interval:  369 QTC Calculation: 467 R Axis:   87  Text Interpretation: Sinus rhythm Low voltage, extremity leads No significant change since last tracing Confirmed by Celesta Coke ([id number]) on 09/03/2023 11:22:14 PM  Radiology No results found.  Procedures Procedures    Medications Ordered in ED Medications  vancomycin  (VANCOREADY) IVPB 1500 mg/300 mL (has no administration in time range)  lactated ringers  bolus 1,000 mL (has no administration in time range)    ED Course/ Medical Decision Making/ A&P Clinical Course as of 09/03/23 2322  Sun Sep 03, 2023  2321 ETOH level 310, lactic 2.4. Fluids and antibiotics ordered. Xray is pending. Patient signed out to Dr. Monique Ano pending XR read with likely plan for admission for post-op infection. [VK]    Clinical Course User Index [VK] Kingsley, Dann Ventress K, DO                                 Medical Decision Making This patient presents to the ED with chief complaint(s) of post-op pain with pertinent past medical history of ETOH use, homelessness, bilateral BKA which further complicates the presenting complaint. The complaint involves an extensive differential diagnosis and also carries with it a high risk of complications and morbidity.    The differential diagnosis includes postop infection, cellulitis, osteomyelitis, no signs of sepsis on  exam, alcohol  intoxication  Additional history obtained: Additional history obtained from N/A Records reviewed previous admission documents  ED Course and Reassessment: On patient's arrival he is hemodynamically stable in no acute distress, he is acutely intoxicated appearing.  He had labs initiated in triage that are within normal range thus far, will add on alcohol  level.  The patient does have erythema and warmth around his incision site concerning for postop infection, will  additionally have x-ray to evaluate for deep space infection.  Vancomycin  was ordered for antibiotics and he will be started on fluids.  Independent labs interpretation:  The following labs were independently interpreted: Elevated alcohol  level and lactate, otherwise labs within normal range  Independent visualization of imaging: - pending  Social Determinants of health: ETOH use disorder, homelessness    Amount and/or Complexity of Data Reviewed Labs: ordered. Radiology: ordered.  Risk Prescription drug management.          Final Clinical Impression(s) / ED Diagnoses Final diagnoses:  Postoperative infection, unspecified type, initial encounter  Alcoholic intoxication without complication [facility])    Rx / DC Orders ED Discharge Orders     None         Kingsley, Kerie Badger K, DO 09/03/23 2322

## 2023-09-04 ENCOUNTER — Inpatient Hospital Stay (HOSPITAL_COMMUNITY): Payer: MEDICAID

## 2023-09-04 ENCOUNTER — Emergency Department (HOSPITAL_COMMUNITY): Payer: MEDICAID

## 2023-09-04 DIAGNOSIS — Z5900 Homelessness unspecified: Secondary | ICD-10-CM

## 2023-09-04 DIAGNOSIS — Z91014 Allergy to mammalian meats: Secondary | ICD-10-CM | POA: Diagnosis not present

## 2023-09-04 DIAGNOSIS — F1010 Alcohol abuse, uncomplicated: Secondary | ICD-10-CM | POA: Diagnosis not present

## 2023-09-04 DIAGNOSIS — T148XXA Other injury of unspecified body region, initial encounter: Secondary | ICD-10-CM

## 2023-09-04 DIAGNOSIS — Y908 Blood alcohol level of 240 mg/100 ml or more: Secondary | ICD-10-CM | POA: Diagnosis present

## 2023-09-04 DIAGNOSIS — M79604 Pain in right leg: Secondary | ICD-10-CM | POA: Diagnosis present

## 2023-09-04 DIAGNOSIS — L03115 Cellulitis of right lower limb: Secondary | ICD-10-CM | POA: Diagnosis not present

## 2023-09-04 DIAGNOSIS — Z89612 Acquired absence of left leg above knee: Secondary | ICD-10-CM | POA: Diagnosis not present

## 2023-09-04 DIAGNOSIS — L089 Local infection of the skin and subcutaneous tissue, unspecified: Secondary | ICD-10-CM | POA: Diagnosis not present

## 2023-09-04 DIAGNOSIS — L309 Dermatitis, unspecified: Secondary | ICD-10-CM | POA: Diagnosis present

## 2023-09-04 DIAGNOSIS — F10220 Alcohol dependence with intoxication, uncomplicated: Secondary | ICD-10-CM | POA: Diagnosis present

## 2023-09-04 DIAGNOSIS — E860 Dehydration: Secondary | ICD-10-CM | POA: Diagnosis present

## 2023-09-04 DIAGNOSIS — E876 Hypokalemia: Secondary | ICD-10-CM | POA: Diagnosis not present

## 2023-09-04 DIAGNOSIS — Z79899 Other long term (current) drug therapy: Secondary | ICD-10-CM | POA: Diagnosis not present

## 2023-09-04 DIAGNOSIS — F10239 Alcohol dependence with withdrawal, unspecified: Secondary | ICD-10-CM | POA: Diagnosis not present

## 2023-09-04 DIAGNOSIS — T8781 Dehiscence of amputation stump: Secondary | ICD-10-CM | POA: Diagnosis present

## 2023-09-04 DIAGNOSIS — F419 Anxiety disorder, unspecified: Secondary | ICD-10-CM | POA: Diagnosis present

## 2023-09-04 DIAGNOSIS — I739 Peripheral vascular disease, unspecified: Secondary | ICD-10-CM | POA: Diagnosis present

## 2023-09-04 DIAGNOSIS — Y835 Amputation of limb(s) as the cause of abnormal reaction of the patient, or of later complication, without mention of misadventure at the time of the procedure: Secondary | ICD-10-CM | POA: Diagnosis present

## 2023-09-04 DIAGNOSIS — F32A Depression, unspecified: Secondary | ICD-10-CM | POA: Diagnosis present

## 2023-09-04 DIAGNOSIS — Z720 Tobacco use: Secondary | ICD-10-CM | POA: Diagnosis not present

## 2023-09-04 DIAGNOSIS — E785 Hyperlipidemia, unspecified: Secondary | ICD-10-CM | POA: Diagnosis present

## 2023-09-04 LAB — CBC
HCT: 35.6 % — ABNORMAL LOW (ref 39.0–52.0)
Hemoglobin: 11.7 g/dL — ABNORMAL LOW (ref 13.0–17.0)
MCH: 28.7 pg (ref 26.0–34.0)
MCHC: 32.9 g/dL (ref 30.0–36.0)
MCV: 87.3 fL (ref 80.0–100.0)
Platelets: 244 10*3/uL (ref 150–400)
RBC: 4.08 MIL/uL — ABNORMAL LOW (ref 4.22–5.81)
RDW: 13.5 % (ref 11.5–15.5)
WBC: 7.3 10*3/uL (ref 4.0–10.5)
nRBC: 0 % (ref 0.0–0.2)

## 2023-09-04 LAB — LACTIC ACID, PLASMA
Lactic Acid, Venous: 1.8 mmol/L (ref 0.5–1.9)
Lactic Acid, Venous: 2.7 mmol/L (ref 0.5–1.9)
Lactic Acid, Venous: 1.8 mmol/L (ref 0.5–1.9)

## 2023-09-04 LAB — I-STAT CG4 LACTIC ACID, ED: Lactic Acid, Venous: 2.1 mmol/L (ref 0.5–1.9)

## 2023-09-04 LAB — CREATININE, SERUM
Creatinine, Ser: 0.59 mg/dL — ABNORMAL LOW (ref 0.61–1.24)
GFR, Estimated: >60 mL/min (ref >60)

## 2023-09-04 LAB — PROCALCITONIN: Procalcitonin: <0.1 ng/mL

## 2023-09-04 LAB — SEDIMENTATION RATE: Sed Rate: 29 mm/h — ABNORMAL HIGH (ref 0–16)

## 2023-09-04 LAB — C-REACTIVE PROTEIN: CRP: 0.5 mg/dL (ref <1.0)

## 2023-09-04 MED ORDER — VANCOMYCIN HCL IN DEXTROSE 1-5 GM/200ML-% IV SOLN
1000.0000 mg | Freq: Two times a day (BID) | INTRAVENOUS | Status: DC
  Administered 2023-09-04 – 2023-09-06 (×4): 1000 mg via INTRAVENOUS
  Filled 2023-09-04 (×5): qty 200

## 2023-09-04 MED ORDER — MELATONIN 5 MG PO TABS
5.0000 mg | ORAL_TABLET | Freq: Every evening | ORAL | Status: DC | PRN
  Administered 2023-09-04 – 2023-09-08 (×5): 5 mg via ORAL
  Filled 2023-09-04 (×5): qty 1

## 2023-09-04 MED ORDER — ONDANSETRON HCL 4 MG PO TABS: 4.0000 mg | ORAL_TABLET | Freq: Four times a day (QID) | ORAL | Status: DC | PRN

## 2023-09-04 MED ORDER — FOLIC ACID 1 MG PO TABS
1.0000 mg | ORAL_TABLET | Freq: Every day | ORAL | Status: DC
  Administered 2023-09-04 – 2023-09-09 (×6): 1 mg via ORAL
  Filled 2023-09-04 (×6): qty 1

## 2023-09-04 MED ORDER — LORAZEPAM 2 MG/ML IJ SOLN
1.0000 mg | INTRAMUSCULAR | Status: DC | PRN
  Administered 2023-09-04: 1 mg via INTRAVENOUS
  Administered 2023-09-04: 2 mg via INTRAVENOUS
  Filled 2023-09-04 (×2): qty 1

## 2023-09-04 MED ORDER — CHLORDIAZEPOXIDE HCL 25 MG PO CAPS
25.0000 mg | ORAL_CAPSULE | Freq: Three times a day (TID) | ORAL | Status: AC
  Administered 2023-09-05 (×3): 25 mg via ORAL
  Filled 2023-09-04 (×3): qty 1

## 2023-09-04 MED ORDER — LORAZEPAM 1 MG PO TABS
1.0000 mg | ORAL_TABLET | ORAL | Status: DC | PRN
  Administered 2023-09-05: 3 mg via ORAL
  Administered 2023-09-05: 2 mg via ORAL
  Filled 2023-09-04: qty 4
  Filled 2023-09-04: qty 2

## 2023-09-04 MED ORDER — IOHEXOL 350 MG/ML SOLN
75.0000 mL | Freq: Once | INTRAVENOUS | Status: AC | PRN
  Administered 2023-09-04: 75 mL via INTRAVENOUS

## 2023-09-04 MED ORDER — THIAMINE MONONITRATE 100 MG PO TABS
100.0000 mg | ORAL_TABLET | Freq: Every day | ORAL | Status: DC
  Administered 2023-09-04 – 2023-09-09 (×6): 100 mg via ORAL
  Filled 2023-09-04 (×7): qty 1

## 2023-09-04 MED ORDER — CHLORDIAZEPOXIDE HCL 25 MG PO CAPS
25.0000 mg | ORAL_CAPSULE | Freq: Four times a day (QID) | ORAL | Status: AC
  Administered 2023-09-04 – 2023-09-05 (×4): 25 mg via ORAL
  Filled 2023-09-04 (×4): qty 1

## 2023-09-04 MED ORDER — CHLORDIAZEPOXIDE HCL 25 MG PO CAPS: 25.0000 mg | ORAL_CAPSULE | Freq: Every day | ORAL | Status: DC

## 2023-09-04 MED ORDER — CHLORDIAZEPOXIDE HCL 25 MG PO CAPS
25.0000 mg | ORAL_CAPSULE | ORAL | Status: DC
  Administered 2023-09-06: 25 mg via ORAL
  Filled 2023-09-04: qty 1

## 2023-09-04 MED ORDER — THIAMINE HCL 100 MG/ML IJ SOLN
100.0000 mg | Freq: Every day | INTRAMUSCULAR | Status: DC
  Filled 2023-09-04 (×4): qty 2

## 2023-09-04 MED ORDER — ONDANSETRON HCL 4 MG/2ML IJ SOLN: 4.0000 mg | Freq: Four times a day (QID) | INTRAMUSCULAR | Status: DC | PRN

## 2023-09-04 MED ORDER — ATORVASTATIN CALCIUM 40 MG PO TABS
40.0000 mg | ORAL_TABLET | Freq: Every day | ORAL | Status: DC
  Administered 2023-09-04 – 2023-09-09 (×6): 40 mg via ORAL
  Filled 2023-09-04 (×6): qty 1

## 2023-09-04 MED ORDER — PIPERACILLIN-TAZOBACTAM 3.375 G IVPB
3.3750 g | Freq: Three times a day (TID) | INTRAVENOUS | Status: DC
  Administered 2023-09-04 – 2023-09-06 (×7): 3.375 g via INTRAVENOUS
  Filled 2023-09-04 (×7): qty 50

## 2023-09-04 MED ORDER — HEPARIN SODIUM (PORCINE) 5000 UNIT/ML IJ SOLN
5000.0000 [IU] | Freq: Three times a day (TID) | INTRAMUSCULAR | Status: DC
  Administered 2023-09-04 – 2023-09-09 (×15): 5000 [IU] via SUBCUTANEOUS
  Filled 2023-09-04 (×15): qty 1

## 2023-09-04 MED ORDER — ADULT MULTIVITAMIN W/MINERALS CH
1.0000 | ORAL_TABLET | Freq: Every day | ORAL | Status: DC
  Administered 2023-09-04 – 2023-09-09 (×6): 1 via ORAL
  Filled 2023-09-04 (×6): qty 1

## 2023-09-04 NOTE — Progress Notes (Signed)
 Patient brought to 4E from ED. Telemetry box applied, CCMD notified. Patient oriented to room and staff. Call bell in reach.    09/04/23 1414  Vitals  Temp 98.7 F (37.1 C)  Temp Source Oral  BP 136/81  MAP (mmHg) 97  BP Location Right Arm  BP Method Automatic  Patient Position (if appropriate) Lying  Pulse Rate (!) 101  Pulse Rate Source Monitor  ECG Heart Rate (!) 101  Resp 20  Level of Consciousness  Level of Consciousness Alert  MEWS COLOR  MEWS Score Color Green  Oxygen Therapy  SpO2 98 %  O2 Device Nasal Cannula  O2 Flow Rate (L/min) 2 L/min  MEWS Score  MEWS Temp 0  MEWS Systolic 0  MEWS Pulse 1  MEWS RR 0  MEWS LOC 0  MEWS Score 1

## 2023-09-04 NOTE — Progress Notes (Signed)
 Patient requesting sleep medication for tonight. MD notified.

## 2023-09-04 NOTE — Progress Notes (Addendum)
 PROGRESS NOTE    Jake Nguyen  [name]:[id number] DOB: 1966/01/15 DOA: 09/03/2023 PCP: Jake Frock, FNP   Brief Narrative: Jake Nguyen is a 58 y.o. male with a history of alcohol  abuse, depression, anxiety, history of bilateral BKA, tobacco abuse, homelessness, chronic right lower leg infection.  Patient presented secondary to right leg wound infection and was started on antibiotics.   Assessment/Plan:  Right BKA stump wound infection Some purulent drainage noted. No significant symptoms per patient. No leukocytosis or fever. X-ray imaging suggests cellulitis. Blood cultures obtained on admission. Empiric Vancomycin  and Zosyn  started on admission. -Continue Vancomycin /Zosyn  -Check CT scan to rule out abscess -Follow-up blood culture  Depression Anxiety Noted. Patient is not on medication therapy for management.  Alcohol  abuse Alcohol  withdrawal Patient's alcohol  level of 310 on admission. Already with signs/symptoms of alcohol  withdrawal. CIWA protocol started on admission. Patient states he sincerely desires to quit drinking because he is worried he is shortening his life expectancy. -Continue CIWA -Start Librium  with taper  Homelessness Noted. TOC consulted.  Tobacco use Cessation discussed on admission.   DVT prophylaxis: Subcutaneous heparin  Code Status:   Code Status: Prior Family Communication: None at bedside Disposition Plan: Discharge pending transition to oral antibiotics and improvement of any alcohol  withdrawal symptoms   Consultants:  None  Procedures:  None  Antimicrobials: Vancomycin  Zosyn     Subjective: No specific concerns today. No leg pain. He noticed drainage from the leg earlier  Objective: BP 114/71   Pulse (!) 121   Temp 98.2 F (36.8 C) (Oral)   Resp 14   Wt 63.8 kg   SpO2 96%   BMI 20.18 kg/m   Examination:  General exam: Appears calm and comfortable Respiratory system: Clear to auscultation. Respiratory  effort normal. Cardiovascular system: S1 & S2 heard, RRR. No murmurs, rubs, gallops or clicks. Gastrointestinal system: Abdomen is nondistended, soft and nontender. Normal bowel sounds heard. Central nervous system: Alert and oriented. No focal neurological deficits. Musculoskeletal: Bilateral BKA stumps with left stump showing evidence of prior dehiscence; wound with area at medial aspect of wound significant for dried purulent appearing discharge. Psychiatry: Judgement and insight appear normal. Mood & affect appropriate.    Data Reviewed: I have personally reviewed following labs and imaging studies   Last CBC Lab Results  Component Value Date   WBC 6.9 09/03/2023   HGB 13.3 09/03/2023   HCT 40.8 09/03/2023   MCV 89.9 09/03/2023   MCH 29.3 09/03/2023   RDW 14.1 09/03/2023   PLT 285 09/03/2023     Last metabolic panel Lab Results  Component Value Date   GLUCOSE 108 (H) 09/03/2023   NA 136 09/03/2023   K 4.9 09/03/2023   CL 100 09/03/2023   CO2 23 09/03/2023   BUN <5 (L) 09/03/2023   CREATININE 0.59 (L) 09/03/2023   GFRNONAA >60 09/03/2023   CALCIUM  8.5 (L) 09/03/2023   PHOS 3.8 08/23/2023   PROT 8.3 (H) 09/03/2023   ALBUMIN 2.9 (L) 09/03/2023   BILITOT 1.2 09/03/2023   ALKPHOS 67 09/03/2023   AST 43 (H) 09/03/2023   ALT 19 09/03/2023   ANIONGAP 13 09/03/2023     Creatinine Clearance: Estimated Creatinine Clearance: 93 mL/min (A) (by C-G formula based on SCr of 0.59 mg/dL (L)).  No results found for this or any previous visit (from the past 240 hours).    Radiology Studies: DG Knee 2 Views Right Result Date: 09/04/2023 CLINICAL DATA:  Status post right below the knee amputation 2  weeks ago presenting with right knee pain and wound drainage. EXAM: RIGHT KNEE - 1-2 VIEW COMPARISON:  Sep 03, 2023 FINDINGS: There is evidence of prior right below the knee amputation. No evidence of an acute fracture, dislocation, or joint effusion. No evidence of arthropathy or other  focal bone abnormality. Radiopaque skin staples are seen along the stump site. Diffuse soft tissue swelling is also seen within this region. IMPRESSION: 1. Evidence of prior right below the knee amputation with diffuse soft tissue swelling seen along the stump site. This may represent sequelae associated with diffuse cellulitis. 2. No acute osseous abnormality. Electronically Signed   By: Virgle Grime M.D.   On: 09/04/2023 02:36   DG Femur Min 2 Views Right Result Date: 09/03/2023 CLINICAL DATA:  Wound infection, evaluation for deep space infection EXAM: RIGHT FEMUR 2 VIEWS COMPARISON:  Knee radiographs 06/14/2014 FINDINGS: No acute fracture or dislocation. No radiographic evidence of osteomyelitis. No evidence of soft tissue gas. IMPRESSION: No acute osseous abnormality Electronically Signed   By: Rozell Cornet M.D.   On: 09/03/2023 22:49      LOS: 0 days    Aneita Keens, MD [facility] 09/04/2023, 7:12 AM   If 7PM-7AM, please contact night-coverage [email or web address]

## 2023-09-04 NOTE — H&P (Signed)
 History and Physical    Jake Nguyen NFA:[id number] DOB: 1965-10-03 DOA: 09/03/2023  PCP: Trenton Frock, FNP  Patient coming from:   I have personally briefly reviewed patient's old medical records in [facility]  Chief Complaint:  right leg wound infection   HPI: Jake Nguyen is a 58 y.o. male with medical history significant of  ETOH abuse, Depression, anxiety, s/p left AKA , tobacco abuse, homlessness, chronic right lower leg infection who was admitted  5/6-5/10 with worsening infection with noted maggot infestation of the wound as well as gangrene. Patient on this admission was treated with iv abx and underwent R BKA stabilized and discharged. Patient now returns with increase pain and drainage from surgical wound x one week.  Patient noted associated swelling and redness. He notes no fever or chills, n/v/d/ sob/ or chest pian. He does note increase serous drainage from wound.  He does admit to continued daily  etoh intake.   ED Course:  Afeb, bp 140/113, hr 103, rr 19 , sat 95%  Wbc 6.9, hgb 13.3, plt 285,  NA 136, k 4.9, CL 100, bicarb 23, glu 108, cr 0.59 ,  TP8.3, albumin 2.9, AST 43 ETOH 310 Cxr Right knee IMPRESSION: 1. Evidence of prior right below the knee amputation with diffuse soft tissue swelling seen along the stump site. This may represent sequelae associated with diffuse cellulitis. 2. No acute osseous abnormality.   Lactic 1.8, cr 0.5, sed reate 29,procal <0.01 EKG: nsr  Tx vanc , 1LR 1, zosyn   Review of Systems: As per HPI otherwise 10 point review of systems negative.   Past Medical History:  Diagnosis Date   Acute metabolic encephalopathy 11/14/2020   Alcohol  abuse with alcohol -induced mood disorder (HCC) 11/10/2019   Alcohol  withdrawal syndrome, with delirium (HCC) 11/06/2019   Alcoholism (HCC)    Anxiety    Depression    Influenza A 05/20/2018   Sepsis (HCC) 11/14/2020   Septic shock (HCC) 11/24/2020    Past Surgical History:   Procedure Laterality Date   ABDOMINAL AORTOGRAM W/LOWER EXTREMITY N/A 08/04/2020   Procedure: ABDOMINAL AORTOGRAM W/LOWER EXTREMITY;  Surgeon: Margherita Shell, MD;  Location: [facility] IN[facility]IVE [facility];  Service: Cardiovascular;  Laterality: N/A;   AMPUTATION Left 08/05/2020   Procedure: AMPUTATION BELOW KNEE;  Surgeon: Margherita Shell, MD;  Location: [facility] OR;  Service: Vascular;  Laterality: Left;   AMPUTATION Left 11/21/2020   Procedure: REVISION LEFT ABOVE KNEE AMPUTATION;  Surgeon: Margherita Shell, MD;  Location: [facility] OR;  Service: Vascular;  Laterality: Left;   AMPUTATION Right 08/20/2023   Procedure: AMPUTATION BELOW KNEE;  Surgeon: Timothy Ford, MD;  Location: [facility] OR;  Service: Orthopedics;  Laterality: Right;   APPLICATION OF WOUND VAC Right 08/20/2023   Procedure: APPLICATION, WOUND VAC;  Surgeon: Timothy Ford, MD;  Location: [facility] OR;  Service: Orthopedics;  Laterality: Right;     reports that he has been smoking cigarettes. He has never used smokeless tobacco. He reports current alcohol  use. He reports current drug use. Drug: Marijuana.  Allergies  Allergen Reactions   Pork-Derived Products Other (See Comments)    Pt states he doesn't eat pork    Family History  Family history unknown: Yes    Prior to Admission medications   Medication Sig Start Date End Date Taking? Authorizing Provider  acetaminophen  (TYLENOL ) 325 MG tablet Take 1-2 tablets (325-650 mg total) by mouth every 6 (six) hours as needed for mild pain (pain score 1-3) (  or temp > 100.5). 08/24/23   Elgergawy, Ardia Kraft, MD  atorvastatin  (LIPITOR) 40 MG tablet Take 1 tablet (40 mg total) by mouth daily. 08/24/23   Elgergawy, Ardia Kraft, MD  folic acid  (FOLVITE ) 1 MG tablet Take 1 tablet (1 mg total) by mouth daily. Patient not taking: Reported on 11/19/2020 08/27/20   Lovell Rubenstein, NP  Multiple Vitamin (MULTIVITAMIN WITH MINERALS) TABS tablet Take 1 tablet by mouth daily. Patient not taking: Reported on 03/19/2021 11/27/20    Verlyn Goad, MD  oxyCODONE  (OXY IR/ROXICODONE ) 5 MG immediate release tablet Take 1-2 tablets (5-10 mg total) by mouth every 6 (six) hours as needed for severe pain (pain score 7-10). 08/24/23   Elgergawy, Ardia Kraft, MD  thiamine  (VITAMIN B-1) 100 MG tablet Take 1 tablet (100 mg total) by mouth daily. 08/25/23   Elgergawy, Ardia Kraft, MD    Physical Exam: Vitals:   09/03/23 2230 09/04/23 0100 09/04/23 0102 09/04/23 0145  BP: 106/77 124/86  107/76  Pulse: 84     Resp: (!) 21 17  18   Temp:   98.2 F (36.8 C)   TempSrc:   Temporal   SpO2: 100%     Weight:        Constitutional: NAD, calm, comfortable Vitals:   09/03/23 2230 09/04/23 0100 09/04/23 0102 09/04/23 0145  BP: 106/77 124/86  107/76  Pulse: 84     Resp: (!) 21 17  18   Temp:   98.2 F (36.8 C)   TempSrc:   Temporal   SpO2: 100%     Weight:       Eyes: PERRL, lids and conjunctivae normal ENMT: Mucous membranes are moist. Posterior pharynx clear of any exudate or lesions.Normal dentition.  Neck: normal, supple, no masses, no thyromegaly Respiratory: clear to auscultation bilaterally, no wheezing, no crackles. Normal respiratory effort. No accessory muscle use.  Cardiovascular: Regular rate and rhythm, no murmurs / rubs / gallops. No extremity edema. Warm lower extremities  .Abdomen: no tenderness, no masses palpated. No hepatosplenomegaly. Bowel sounds positive.  Musculoskeletal: no clubbing / cyanosis. No joint deformity upper and lower extremities. Good ROM, no contractures. Normal muscle tone.  Skin: no rashes, lesions, ulcers. No induration Neurologic: CN 2-12 grossly intact. Sensation intact, Strength 5/5 in all 4.  Psychiatric:  Alert and oriented x 3. Normal mood.    Labs on Admission: I have personally reviewed following labs and imaging studies  CBC: Recent Labs  Lab 09/03/23 2119  WBC 6.9  NEUTROABS 3.3  HGB 13.3  HCT 40.8  MCV 89.9  PLT 285   Basic Metabolic Panel: Recent Labs  Lab 09/03/23 2119   NA 136  K 4.9  CL 100  CO2 23  GLUCOSE 108*  BUN <5*  CREATININE 0.59*  CALCIUM  8.5*   GFR: Estimated Creatinine Clearance: 93 mL/min (A) (by C-G formula based on SCr of 0.59 mg/dL (L)). Liver Function Tests: Recent Labs  Lab 09/03/23 2119  AST 43*  ALT 19  ALKPHOS 67  BILITOT 1.2  PROT 8.3*  ALBUMIN 2.9*   No results for input(s): "LIPASE", "AMYLASE" in the last 168 hours. No results for input(s): "AMMONIA" in the last 168 hours. Coagulation Profile: Recent Labs  Lab 09/03/23 2119  INR 1.0   Cardiac Enzymes: No results for input(s): "CKTOTAL", "CKMB", "CKMBINDEX", "TROPONINI" in the last 168 hours. BNP (last 3 results) No results for input(s): "PROBNP" in the last 8760 hours. HbA1C: No results for input(s): "HGBA1C" in the last 72 hours.  CBG: No results for input(s): "GLUCAP" in the last 168 hours. Lipid Profile: No results for input(s): "CHOL", "HDL", "LDLCALC", "TRIG", "CHOLHDL", "LDLDIRECT" in the last 72 hours. Thyroid  Function Tests: No results for input(s): "TSH", "T4TOTAL", "FREET4", "T3FREE", "THYROIDAB" in the last 72 hours. Anemia Panel: No results for input(s): "VITAMINB12", "FOLATE", "FERRITIN", "TIBC", "IRON ", "RETICCTPCT" in the last 72 hours. Urine analysis:    Component Value Date/Time   COLORURINE YELLOW 03/19/2021 0101   APPEARANCEUR CLEAR 03/19/2021 0101   LABSPEC 1.012 03/19/2021 0101   PHURINE 5.0 03/19/2021 0101   GLUCOSEU 50 (A) 03/19/2021 0101   HGBUR NEGATIVE 03/19/2021 0101   BILIRUBINUR NEGATIVE 03/19/2021 0101   KETONESUR 5 (A) 03/19/2021 0101   PROTEINUR NEGATIVE 03/19/2021 0101   UROBILINOGEN 0.2 08/02/2009 2144   NITRITE NEGATIVE 03/19/2021 0101   LEUKOCYTESUR NEGATIVE 03/19/2021 0101    Radiological Exams on Admission: DG Femur Min 2 Views Right Result Date: 09/03/2023 CLINICAL DATA:  Wound infection, evaluation for deep space infection EXAM: RIGHT FEMUR 2 VIEWS COMPARISON:  Knee radiographs 06/14/2014 FINDINGS: No  acute fracture or dislocation. No radiographic evidence of osteomyelitis. No evidence of soft tissue gas. IMPRESSION: No acute osseous abnormality Electronically Signed   By: Rozell Cornet M.D.   On: 09/03/2023 22:49    EKG: Independently reviewed. See above  Assessment/Plan    Right AKA  stump wound infection  -swollen, red/tender - continue on broad spectrum abx for now de-escalate in am  - of note inflammatory marks  are wnl except ESR slightly elevated  - continue monitor on antibiotics   Depression /anxiety  -not currently on medications   ETOH abuse  - elevated etoh to 310 -place on ciwa protocol   Homelessness -case work consult   Tobacco abuse  -encourage cessation   DVT prophylaxis:  heparin   Code Status: full/ as discussed per patient wishes in event of cardiac arrest  Family Communication: none at bedside Disposition Plan: patient  expected to be admitted greater than 2 midnights  Consults called: none Admission status: progressive ( in setting etoh and need for ciwa)   Sabas Cradle MD [facility]   If 7PM-7AM, please contact night-coverage [email or web address] Password [email or web address]  09/04/2023, 1:56 AM

## 2023-09-04 NOTE — Progress Notes (Signed)
 CSW added substance abuse resources to patient's AVS.  Edwin Dada, MSW, LCSW Transitions of Care  Clinical Social Worker II [phone]

## 2023-09-04 NOTE — Discharge Instructions (Signed)

## 2023-09-04 NOTE — Progress Notes (Addendum)
 Pharmacy Antibiotic Note  Jake Nguyen is a 58 y.o. male admitted on 09/03/2023 with post-op infection. PMH includes recent R. BKA on 5/10 and now with increase in pain/redness/drainage.  Pharmacy has been consulted for zosyn /vancomycin  dosing.  -WBC WNL, sCr 0.59 (~bl), lactate 2.4, afebrile -Imaging showing no signs of osteomyelitis -Blood cultures collected  Plan: -Zosyn  3.375gm IV every 8 hours -Vancomycin  1500mg  IV x1 -Vancomycin  1000mg  IV every 12 hours (AUC 530, Vd 0.72, TBW, sCr 0.8) -Monitor renal function -Follow up signs of clinical improvement, LOT, de-escalation of antibiotics   Weight: 63.8 kg (140 lb 10.5 oz)  Temp (24hrs), Avg:98.2 F (36.8 C), Min:98.1 F (36.7 C), Max:98.2 F (36.8 C)  Recent Labs  Lab 09/03/23 2119 09/03/23 2312  WBC 6.9  --   CREATININE 0.59*  --   LATICACIDVEN  --  2.4*    Estimated Creatinine Clearance: 93 mL/min (A) (by C-G formula based on SCr of 0.59 mg/dL (L)).    Allergies  Allergen Reactions   Pork-Derived Products Other (See Comments)    Pt states he doesn't eat pork    Antimicrobials this admission: Zosyn  5/26 >>  Vancomycin  5/26 >>   Microbiology results: 5/25 BCx:   Thank you for allowing pharmacy to be a part of this patient's care.  [name], PharmD, BCPS Clinical Pharmacist 09/04/2023 2:02 AM

## 2023-09-05 DIAGNOSIS — L03115 Cellulitis of right lower limb: Secondary | ICD-10-CM

## 2023-09-05 DIAGNOSIS — T148XXA Other injury of unspecified body region, initial encounter: Secondary | ICD-10-CM | POA: Diagnosis not present

## 2023-09-05 DIAGNOSIS — L089 Local infection of the skin and subcutaneous tissue, unspecified: Secondary | ICD-10-CM | POA: Diagnosis not present

## 2023-09-05 MED ORDER — ACETAMINOPHEN 325 MG PO TABS
650.0000 mg | ORAL_TABLET | Freq: Four times a day (QID) | ORAL | Status: DC | PRN
  Administered 2023-09-05 – 2023-09-08 (×5): 650 mg via ORAL
  Filled 2023-09-05 (×5): qty 2

## 2023-09-05 NOTE — Progress Notes (Signed)
 PROGRESS NOTE    Jake Nguyen  VWU:[id number] DOB: 03-Oct-1965 DOA: 09/03/2023 PCP: Trenton Frock, FNP   Brief Narrative: Jake Nguyen is a 58 y.o. male with a history of alcohol  abuse, depression, anxiety, history of bilateral BKA, tobacco abuse, homelessness, chronic right lower leg infection.  Patient presented secondary to right leg wound infection and was started on antibiotics.   Assessment/Plan:  Right BKA stump wound infection Some purulent drainage noted. No significant symptoms per patient. No leukocytosis or fever. X-ray imaging suggests cellulitis. Blood cultures obtained on admission. Empiric Vancomycin  and Zosyn  started on admission. CT of right leg pending. -Continue Vancomycin /Zosyn  -Follow-up CT to rule out abscess -Follow-up blood culture -Will curbside orthopedic surgery, Dr. Julio Ohm  Depression Anxiety Noted. Patient is not on medication therapy for management.  Alcohol  abuse Alcohol  withdrawal Patient's alcohol  level of 310 on admission. Already with signs/symptoms of alcohol  withdrawal. CIWA protocol started on admission. Patient states he sincerely desires to quit drinking because he is worried he is shortening his life expectancy. No significant withdrawal symptoms at this time. -Continue CIWA -Continue Librium  with taper  Homelessness Noted. TOC consulted.  Tobacco use Cessation discussed on admission.   DVT prophylaxis: Subcutaneous heparin  Code Status:   Code Status: Prior Family Communication: None at bedside Disposition Plan: Discharge pending transition to oral antibiotics and improvement of any alcohol  withdrawal symptoms   Consultants:  None  Procedures:  None  Antimicrobials: Vancomycin  Zosyn     Subjective: No specific concerns this morning. Homeless. Uses a wheelchair at baseline. No prosthetic.  Objective: BP 123/84   Pulse 88   Temp 98.4 F (36.9 C) (Oral)   Resp 17   Wt 63.8 kg   SpO2 95%   BMI 20.18 kg/m    Examination:  General exam: Appears calm and comfortable Respiratory system: Clear to auscultation. Respiratory effort normal. Cardiovascular system: S1 & S2 heard, RRR. No murmurs. Gastrointestinal system: Abdomen is nondistended, soft and nontender. Normal bowel sounds heard. Central nervous system: Alert and oriented. No focal neurological deficits. Musculoskeletal: BKA. Right stump with chronically dehisced incision with staples. No calf tenderness Skin: No cyanosis. No rashes Psychiatry: Judgement and insight appear normal. Mood & affect appropriate.    Data Reviewed: I have personally reviewed following labs and imaging studies   Last CBC Lab Results  Component Value Date   WBC 7.3 09/04/2023   HGB 11.7 (L) 09/04/2023   HCT 35.6 (L) 09/04/2023   MCV 87.3 09/04/2023   MCH 28.7 09/04/2023   RDW 13.5 09/04/2023   PLT 244 09/04/2023     Last metabolic panel Lab Results  Component Value Date   GLUCOSE 108 (H) 09/03/2023   NA 136 09/03/2023   K 4.9 09/03/2023   CL 100 09/03/2023   CO2 23 09/03/2023   BUN <5 (L) 09/03/2023   CREATININE 0.59 (L) 09/04/2023   GFRNONAA >60 09/04/2023   CALCIUM  8.5 (L) 09/03/2023   PHOS 3.8 08/23/2023   PROT 8.3 (H) 09/03/2023   ALBUMIN 2.9 (L) 09/03/2023   BILITOT 1.2 09/03/2023   ALKPHOS 67 09/03/2023   AST 43 (H) 09/03/2023   ALT 19 09/03/2023   ANIONGAP 13 09/03/2023     Creatinine Clearance: Estimated Creatinine Clearance: 93 mL/min (A) (by C-G formula based on SCr of 0.59 mg/dL (L)).  Recent Results (from the past 240 hours)  Culture, blood (Routine x 2)     Status: None (Preliminary result)   Collection Time: 09/03/23 10:20 PM   Specimen: BLOOD LEFT  HAND  Result Value Ref Range Status   Specimen Description BLOOD LEFT HAND  Final   Special Requests   Final    BOTTLES DRAWN AEROBIC AND ANAEROBIC Blood Culture adequate volume   Culture   Final    NO GROWTH 2 DAYS Performed at [facility], [address]  [place], [place] [address]    Report Status PENDING  Incomplete  Culture, blood (Routine x 2)     Status: None (Preliminary result)   Collection Time: 09/04/23  3:15 PM   Specimen: BLOOD  Result Value Ref Range Status   Specimen Description BLOOD SITE NOT SPECIFIED  Final   Special Requests   Final    BOTTLES DRAWN AEROBIC AND ANAEROBIC Blood Culture results may not be optimal due to an inadequate volume of blood received in culture bottles   Culture   Final    NO GROWTH < 24 HOURS Performed at [facility], [address], [place], [place] [address]    Report Status PENDING  Incomplete      Radiology Studies: DG Knee 2 Views Right Result Date: 09/04/2023 CLINICAL DATA:  Status post right below the knee amputation 2 weeks ago presenting with right knee pain and wound drainage. EXAM: RIGHT KNEE - 1-2 VIEW COMPARISON:  Sep 03, 2023 FINDINGS: There is evidence of prior right below the knee amputation. No evidence of an acute fracture, dislocation, or joint effusion. No evidence of arthropathy or other focal bone abnormality. Radiopaque skin staples are seen along the stump site. Diffuse soft tissue swelling is also seen within this region. IMPRESSION: 1. Evidence of prior right below the knee amputation with diffuse soft tissue swelling seen along the stump site. This may represent sequelae associated with diffuse cellulitis. 2. No acute osseous abnormality. Electronically Signed   By: Virgle Grime M.D.   On: 09/04/2023 02:36   DG Femur Min 2 Views Right Result Date: 09/03/2023 CLINICAL DATA:  Wound infection, evaluation for deep space infection EXAM: RIGHT FEMUR 2 VIEWS COMPARISON:  Knee radiographs 06/14/2014 FINDINGS: No acute fracture or dislocation. No radiographic evidence of osteomyelitis. No evidence of soft tissue gas. IMPRESSION: No acute osseous abnormality Electronically Signed   By: Rozell Cornet M.D.   On: 09/03/2023 22:49      LOS: 1 day    Aneita Keens, MD [facility] 09/05/2023, 10:19 AM   If 7PM-7AM, please contact night-coverage [email or web address]

## 2023-09-05 NOTE — TOC Initial Note (Signed)
 Transition of Care [facility]) - Initial/Assessment Note    Patient Details  Name: Jake Nguyen MRN: [id number] Date of Birth: February 24, 1966  Transition of Care Jake Nguyen) CM/SW Contact:    Jake Gaucher, LCSW Phone Number: 09/05/2023, 4:22 PM  Clinical Narrative:                  CSW met with patient at bedside. CSW introduced self and explained role. Patient states he has no permanent address. He states he has been homeless for about 2 years. Patient states he has been to shelters in the past. He  has tried to get into the [employer]  but states they are always full. Patient states he gets SSI income each month.   Patient reports he had a wheelchair but it was left on [address] when he was brought to the hospital. He denies illegal drug use but admits to alcohol  and tobacco use. CSW inquired if SNF was an option would he be agreeable to giving  his SSI income to the SNF. Patient declined. Patient reports he has done that in the past and he is not giving them all of his income. Patient states " I will take my changes on the streets".   CSW called patient's sister, Jake Nguyen, has requested to let her know he was in the hospital. Patient's sister explained, she has tried to help patient but " he don't listen".   TOC will continue to follow and assist with discharge planning.   Jake Nguyen, MSW, LCSW Clinical Social Worker     Barriers to Discharge: Continued Medical Work up   Patient Goals and CMS Choice            Expected Discharge Plan and Services In-house Referral: Clinical Social Work                                            Prior Living Arrangements/Services   Lives with:: Self, Other (Comment) (homeless) Patient language and need for interpreter reviewed:: No        Need for Family Participation in Patient Care: Yes (Comment) Care giver support system in place?: No (comment)      Activities of Daily Living   ADL Screening (condition at  time of admission) Independently performs ADLs?: Yes (appropriate for developmental age) Is the patient deaf or have difficulty hearing?: No Does the patient have difficulty seeing, even when wearing glasses/contacts?: No Does the patient have difficulty concentrating, remembering, or making decisions?: No  Permission Sought/Granted Permission sought to share information with : Family Supports Permission granted to share information with : Yes, Verbal Permission Granted  Share Information with NAME: Jake Nguyen     Permission granted to share info w Relationship: sister  Permission granted to share info w Contact Information: [phone]  Emotional Assessment Appearance:: Appears stated age     Orientation: : Oriented to Self, Oriented to Place, Oriented to  Time, Oriented to Situation Alcohol  / Substance Use: Alcohol  Use, Tobacco Use Psych Involvement: No (comment)  Admission diagnosis:  Wound infection [T14.8XXA, L08.9] Alcoholic intoxication without complication (HCC) [F10.920] Postoperative infection, unspecified type, initial encounter [T81.40XA] Patient Active Problem List   Diagnosis Date Noted   Gangrene of right foot (HCC) 08/19/2023   Alcohol  use disorder, severe, dependence (HCC) 08/19/2023   Hypotension 08/19/2023   Depression with anxiety 08/19/2023   Tobacco use  08/19/2023   Right foot infection 07/21/2023   Wound infection 07/20/2023   Hypothermia    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   Gram-negative bacteremia 11/24/2020   Cellulitis of left leg 11/19/2020   History of alcohol  withdrawal syndrome 11/19/2020   Hypokalemia 11/19/2020   S/P BKA (below knee amputation) unilateral, left (HCC)    Neuropathic pain, leg, left    Gangrene of foot (HCC) 07/14/2020   Ataxia    Pressure injury of skin 05/25/2018   Rhabdomyolysis 05/20/2018   Hyponatremia 05/20/2018   Elevated troponin 05/20/2018   Moderate dehydration 05/20/2018   Alcohol  use 05/20/2018    Transaminitis 05/20/2018   PCP:  Jake Frock, FNP Pharmacy:   [facility] [id number] - [address] [place] - [address] AT [address] [place] [address] Phone: [phone] Fax: [phone]     Social Drivers of Health (SDOH) Social History: SDOH Screenings   Food Insecurity: Food Insecurity Present (09/04/2023)  Housing: High Risk (09/04/2023)  Transportation Needs: Unmet Transportation Needs (09/04/2023)  Utilities: Not At Risk (09/04/2023)  Alcohol  Screen: Medium Risk (11/25/2020)  Depression (PHQ2-9): Low Risk  (11/06/2019)  Social Connections: Socially Isolated (07/20/2023)  Tobacco Use: High Risk (09/03/2023)   SDOH Interventions:     Readmission Risk Interventions     No data to display

## 2023-09-06 DIAGNOSIS — T148XXA Other injury of unspecified body region, initial encounter: Secondary | ICD-10-CM | POA: Diagnosis not present

## 2023-09-06 DIAGNOSIS — L089 Local infection of the skin and subcutaneous tissue, unspecified: Secondary | ICD-10-CM | POA: Diagnosis not present

## 2023-09-06 NOTE — Evaluation (Signed)
 Occupational Therapy Evaluation Patient Details Name: Jake Nguyen MRN: [id number] DOB: Nov 23, 1965 Today's Date: 09/06/2023   History of Present Illness   Pt is a 58 y.o. male who presented 09/03/23 due to RLE wound infection. Pt had a recent admission on 5/6-01/2024 with worsening RLE pain and infection, noted maggots and gangrene and s/p R BKA.  PMH includes L AKA (07/2020), PAD, anxiety, ETOH abuse, tobacco abuse, homelessness (~2 years)     Clinical Impressions Pt reported at PLOF they are homeless and some people will assist on the streets but operates at [facility] level but it got left behind when EMS picked him up. Pt presented in chair and agreeable to complete a light sponge bath at this time with lateral leaning to side to side with set up of materials and CGA when leaning. Pt at this time reporting all he needs is a WC as reports "what are they going to do for me in rehab?" However, patient will benefit from continued inpatient follow up therapy, <3 hours/day.      If plan is discharge home, recommend the following:   A little help with bathing/dressing/bathroom;A little help with walking and/or transfers;Assistance with cooking/housework;Assist for transportation     Functional Status Assessment   Patient has had a recent decline in their functional status and demonstrates the ability to make significant improvements in function in a reasonable and predictable amount of time.     Equipment Recommendations   Wheelchair (measurements OT);Wheelchair cushion (measurements OT)     Recommendations for Other Services         Precautions/Restrictions   Precautions Precautions: Fall Recall of Precautions/Restrictions: Impaired Precaution/Restrictions Comments: pt arrived with maggots in R residual limb incision Restrictions Weight Bearing Restrictions Per Provider Order: Yes RLE Weight Bearing Per Provider Order: Non weight bearing Other Position/Activity Restrictions:  hx of L AKA, has a limb protector for R LE     Mobility Bed Mobility               General bed mobility comments: Pt presented in chair    Transfers                   General transfer comment: dnt as wanted to complete clean up      Balance Overall balance assessment: Needs assistance Sitting-balance support: Bilateral upper extremity supported Sitting balance-Leahy Scale: Fair                                     ADL either performed or assessed with clinical judgement   ADL Overall ADL's : Needs assistance/impaired Eating/Feeding: Independent   Grooming: Wash/dry hands;Wash/dry face;Oral care;Set up;Sitting   Upper Body Bathing: Set up;Sitting   Lower Body Bathing: Contact guard assist;Sitting/lateral leans   Upper Body Dressing : Set up;Sitting   Lower Body Dressing: Contact guard assist;Sitting/lateral leans                       Vision Baseline Vision/History: 0 No visual deficits Patient Visual Report: No change from baseline Vision Assessment?: No apparent visual deficits     Perception Perception: Within Functional Limits       Praxis Praxis: WFL       Pertinent Vitals/Pain Pain Assessment Pain Assessment: Faces Faces Pain Scale: Hurts a little bit Breathing: normal Negative Vocalization: none Facial Expression: smiling or inexpressive Body Language: relaxed Consolability: no need to  console PAINAD Score: 0 Pain Location: RLE Pain Descriptors / Indicators: Grimacing, Guarding, Sore Pain Intervention(s): Limited activity within patient's tolerance, Monitored during session, Repositioned     Extremity/Trunk Assessment Upper Extremity Assessment Upper Extremity Assessment: Overall WFL for tasks assessed   Lower Extremity Assessment Lower Extremity Assessment: Defer to PT evaluation RLE Deficits / Details: R BKA, pt able to achieve full knee extension with increased time LLE Deficits / Details: hx of Lt  AKA, good hip ROM   Cervical / Trunk Assessment Cervical / Trunk Assessment: Normal   Communication Communication Communication: No apparent difficulties   Cognition Arousal: Alert Behavior During Therapy: WFL for tasks assessed/performed Cognition: No apparent impairments                               Following commands: Intact       Cueing  General Comments   Cueing Techniques: Verbal cues;Gestural cues  VSS, educated pt on importance of off-weighting buttocks and doing chair push ups, Noted non-healing wound in R residual limb, pt assist with hygiene as pt was soiled in urine   Exercises     Shoulder Instructions      Home Living Family/patient expects to be discharged to:: Shelter/Homeless Living Arrangements: Group Home                               Additional Comments: pt living on streets in [place]      Prior Functioning/Environment Prior Level of Function : Needs assist             Mobility Comments: pt states people help him in/out of w/c. sleeps in w/c ADLs Comments: reports sponge bathing and "doing what I can, which isn't a whole lot."    OT Problem List: Decreased activity tolerance;Impaired balance (sitting and/or standing);Decreased knowledge of precautions;Decreased knowledge of use of DME or AE;Pain   OT Treatment/Interventions: Self-care/ADL training;Therapeutic exercise;DME and/or AE instruction;Energy conservation;Therapeutic activities;Patient/family education;Balance training      OT Goals(Current goals can be found in the care plan section)   Acute Rehab OT Goals Patient Stated Goal: to get a wc OT Goal Formulation: With patient Time For Goal Achievement: 09/20/23 Potential to Achieve Goals: Good   OT Frequency:  Min 2X/week    Co-evaluation              AM-PAC OT "6 Clicks" Daily Activity     Outcome Measure Help from another person eating meals?: None Help from another person taking care  of personal grooming?: None Help from another person toileting, which includes using toliet, bedpan, or urinal?: A Little Help from another person bathing (including washing, rinsing, drying)?: A Little Help from another person to put on and taking off regular upper body clothing?: A Little Help from another person to put on and taking off regular lower body clothing?: A Little 6 Click Score: 20   End of Session Nurse Communication: Mobility status  Activity Tolerance: Patient tolerated treatment well Patient left: in chair;with call bell/phone within reach;with chair alarm set  OT Visit Diagnosis: Other abnormalities of gait and mobility (R26.89)                Time: 9629-5284 OT Time Calculation (min): 27 min Charges:  OT General Charges $OT Visit: 1 Visit OT Evaluation $OT Eval Moderate Complexity: 1 Mod OT Treatments $Self Care/Home Management : 8-22 mins  Erving Heather OTR/L  Acute Rehab Services  [phone] office number   Stevphen Elders 09/06/2023, 11:04 AM

## 2023-09-06 NOTE — Hospital Course (Signed)
 58 y.o. M with PVD, HLD, hx EtOH, s/p left AKA 2022 and right BKA earlier this month who returned for increased pain and discharge from his surgical wound.

## 2023-09-06 NOTE — Progress Notes (Signed)
 Orthopedic Tech Progress Note Patient Details:  SIDDIQ KALUZNY [birth year] [id number]  Called in order to [employer] for a SHRINKER   Patient ID: Charmaine Coop, male   DOB: Apr 19, 1965, 58 y.o.   MRN: [id number]  Kermitt Pedlar 09/06/2023, 8:31 AM

## 2023-09-06 NOTE — TOC Progression Note (Signed)
 Transition of Care [facility]) - Progression Note    Patient Details  Name: Jake Nguyen MRN: [id number] Date of Birth: [birth year]  [facility]) CM/SW Contact  Valery Gaucher, [place] Phone Number: 09/06/2023, 11:42 AM  Clinical Narrative:     CSW met with patient at bedside along with RNCM. Patient states at discharge he is  going to [employer]. Patient declined substance abuse resources resources. Including he declined referral to the [facility] at this time. Patient states he has spoken with his sister and they are "working on things".  Liddie Reel, MSW, LCSW Clinical Social Worker       Barriers to Discharge: Continued Medical Work up  Expected Discharge Plan and Services In-house Referral: Clinical Social Work                                             Social Determinants of Health (SDOH) Interventions SDOH Screenings   Food Insecurity: Food Insecurity Present (09/04/2023)  Housing: High Risk (09/04/2023)  Transportation Needs: Unmet Transportation Needs (09/04/2023)  Utilities: Not At Risk (09/04/2023)  Alcohol  Screen: Medium Risk (11/25/2020)  Depression (PHQ2-9): Low Risk  (11/06/2019)  Social Connections: Socially Isolated (07/20/2023)  Tobacco Use: High Risk (09/03/2023)    Readmission Risk Interventions     No data to display

## 2023-09-06 NOTE — Plan of Care (Signed)

## 2023-09-06 NOTE — Evaluation (Signed)
 Physical Therapy Evaluation Patient Details Name: Jake Nguyen MRN: [id number] DOB: [birth year] Today's Date: 09/06/2023  History of Present Illness  Pt is a 58 y.o. male who presented 09/03/23 due to RLE wound infection. Pt had a recent admission on 5/6-01/2024 with worsening RLE pain and infection, noted maggots and gangrene and s/p R BKA.  PMH includes L AKA (07/2020), PAD, anxiety, ETOH abuse, tobacco abuse, homelessness (~2 years)   Clinical Impression  Pt admitted with above. Pt s/p d/c from hospital after undergoing R BKA  2weeks ago and has been living on the streets in his w/c. After max encouragement pt agreed to transfer to recliner. Pt requiring max multimodal directional cues for transfer technique and minA  to manage pillow in chair and stabilize patient. Pt unsure if his w/c his still on the side of the road where he left. Pt will need w/c for mobility as pt with L AKA and R BKA with inability to ambulate. Pt to benefit from inpatient rehab program < 3 hrs a day to achieve safe w/c mod I Level of function. Acute PT to cont to follow.      If plan is discharge home, recommend the following: A lot of help with walking and/or transfers;A lot of help with bathing/dressing/bathroom;Assist for transportation;Help with stairs or ramp for entrance;Assistance with cooking/housework   Can travel by private vehicle   No    Equipment Recommendations Wheelchair (measurements PT);Wheelchair cushion (measurements PT) (aware we just gave him one over recent admission)  Recommendations for Other Services       Functional Status Assessment Patient has had a recent decline in their functional status and demonstrates the ability to make significant improvements in function in a reasonable and predictable amount of time.     Precautions / Restrictions Precautions Precautions: Fall Recall of Precautions/Restrictions: Impaired Precaution/Restrictions Comments: pt arrived with maggots in R  residual limb incision Restrictions Weight Bearing Restrictions Per Provider Order: Yes RLE Weight Bearing Per Provider Order: Non weight bearing Other Position/Activity Restrictions: hx of L AKA, has a limb protector for R LE      Mobility  Bed Mobility Overal bed mobility: Needs Assistance Bed Mobility: Supine to Sit     Supine to sit: Supervision     General bed mobility comments: supine to long sit with HOB elevated.    Transfers Overall transfer level: Needs assistance Equipment used: None Transfers: Bed to chair/wheelchair/BSC            Lateral/Scoot Transfers: Min assist General transfer comment: labored effort, minA to maintain pillow in place in chair, pt started off with lateral scoot transfer and then proceeded to rotate and go in more posteriorly    Ambulation/Gait               General Gait Details: unable  Stairs            Wheelchair Mobility     Tilt Bed    Modified Rankin (Stroke Patients Only)       Balance Overall balance assessment: Needs assistance Sitting-balance support: Bilateral upper extremity supported Sitting balance-Leahy Scale: Fair Sitting balance - Comments: pt requiring holding onto to rail with R UE, posterior bias, even more when attempting dynamic activity                                     Pertinent Vitals/Pain Pain Assessment Pain Assessment: 0-10 Pain Score:  5  Pain Location: R residual limb Pain Descriptors / Indicators: Grimacing, Guarding, Sore Pain Intervention(s): Monitored during session    Home Living Family/patient expects to be discharged to:: Other (Comment)                   Additional Comments: pt living on streets in [place]    Prior Function Prior Level of Function : Needs assist             Mobility Comments: pt states people help him in/out of w/c. sleeps in w/c ADLs Comments: reports sponge bathing and "doing what I can, which isn't a whole  lot."     Extremity/Trunk Assessment   Upper Extremity Assessment Upper Extremity Assessment: Overall WFL for tasks assessed    Lower Extremity Assessment Lower Extremity Assessment: RLE deficits/detail;LLE deficits/detail RLE Deficits / Details: R BKA, pt able to achieve full knee extension with increased time LLE Deficits / Details: hx of Lt AKA, good hip ROM    Cervical / Trunk Assessment Cervical / Trunk Assessment: Normal  Communication   Communication Communication: No apparent difficulties    Cognition Arousal: Alert Behavior During Therapy: WFL for tasks assessed/performed   PT - Cognitive impairments: No apparent impairments                       PT - Cognition Comments: pt A&Ox4, pt with decreased insight to safety and deficits and noted inability to properly care for self however this is patients baseline. Pt has been homeless for 2 years, refuses care, and is not willing to give up alcohol  Following commands: Intact       Cueing Cueing Techniques: Verbal cues, Gestural cues     General Comments General comments (skin integrity, edema, etc.): VSS, educated pt on importance of off-weighting buttocks and doing chair push ups, Noted non-healing wound in R residual limb, pt assist with hygiene as pt was soiled in urine    Exercises     Assessment/Plan    PT Assessment Patient needs continued PT services  PT Problem List Decreased strength;Pain;Decreased knowledge of precautions;Decreased knowledge of use of DME;Decreased mobility;Decreased balance;Decreased activity tolerance;Decreased range of motion       PT Treatment Interventions DME instruction;Functional mobility training;Therapeutic activities;Therapeutic exercise;Balance training;Neuromuscular re-education;Stair training;Patient/family education;Wheelchair mobility training;Modalities    PT Goals (Current goals can be found in the Care Plan section)  Acute Rehab PT Goals Patient Stated Goal:  walk PT Goal Formulation: With patient Time For Goal Achievement: 09/20/23 Potential to Achieve Goals: Good Additional Goals Additional Goal #1: Pt to complete floor to w/c transfer with supervision to safely be able to lower self to the ground to sleep when in streets.    Frequency Min 2X/week     Co-evaluation               AM-PAC PT "6 Clicks" Mobility  Outcome Measure Help needed turning from your back to your side while in a flat bed without using bedrails?: A Little Help needed moving from lying on your back to sitting on the side of a flat bed without using bedrails?: A Little Help needed moving to and from a bed to a chair (including a wheelchair)?: A Lot Help needed standing up from a chair using your arms (e.g., wheelchair or bedside chair)?: Total Help needed to walk in hospital room?: Total Help needed climbing 3-5 steps with a railing? : Total 6 Click Score: 11    End of Session  Equipment Utilized During Treatment: Gait belt Activity Tolerance: Patient tolerated treatment well Patient left: in chair;with call bell/phone within reach;with chair alarm set;with nursing/sitter in room Nurse Communication: Mobility status (emptied 400cc from urinal, pt completedly soiled in bed) PT Visit Diagnosis: Muscle weakness (generalized) (M62.81);Difficulty in walking, not elsewhere classified (R26.2);Pain Pain - Right/Left: Right Pain - part of body: Leg    Time: 2130-8657 PT Time Calculation (min) (ACUTE ONLY): 28 min   Charges:   PT Evaluation $PT Eval Moderate Complexity: 1 Mod PT Treatments $Therapeutic Activity: 8-22 mins PT General Charges $$ ACUTE PT VISIT: 1 Visit         Renaee Caro, PT, DPT Acute Rehabilitation Services Secure chat preferred Office #: [phone]   Jenna Moan 09/06/2023, 9:45 AM

## 2023-09-06 NOTE — Discharge Summary (Signed)
 Physician Discharge Summary   Patient: Jake Nguyen MRN: [id number] DOB: Mar 13, 1966  Admit date:     09/03/2023  Discharge date: 09/06/23  Discharge Physician: Ephriam Hashimoto   PCP: Trenton Frock, FNP     Recommendations at discharge:  Follow up with Dr. Julio Ohm in the office     Discharge Diagnoses: Principal Problem:   Right BKA Wound infection Active Problems:   S/P bilateral BKA (below knee amputation) (HCC)   Alcohol  use disorder, severe, dependence (HCC)   Depression with anxiety      Hospital Course: 58 y.o. M with PVD, HLD, hx EtOH, s/p left AKA 2022 and right BKA earlier this month who returned for increased pain from his surgical wound.    Minor BKA stump dehiscence Patient was admitted and started on broad-spectrum antibiotics.  No leukocytosis or fever.  CT of the leg was obtained that showed expected postop fluid collection.  For some reason there was a delay of 48 hours in obtaining orthopedic consultation, Dr. Julio Ohm evaluated the leg today.  He agrees there is no signs of infection.  Antibiotics stopped.    Shrinker was ordered.  Extensive efforts were made by [facility] to encourage the patient to discharge to skilled nursing facility, but he refused, requested to be discharged to the street.        Alcohol  abuse Alcohol  withdrawal Patient's alcohol  level of 310 on admission. He had some withdrawal symptoms yesterday, this is completely resolved today.  No delirium, no seizures.  No signs of active withdrawal.             [place]  Controlled Substances Registry was reviewed for this patient prior to discharge.   Consultants: Orthopedics      DISCHARGE MEDICATION: Allergies as of 09/06/2023       Reactions   Pork-derived Products Other (See Comments)   Pt states he doesn't eat pork        Medication List     TAKE these medications    acetaminophen  325 MG tablet Commonly known as: TYLENOL  Take 1-2 tablets (325-650  mg total) by mouth every 6 (six) hours as needed for mild pain (pain score 1-3) (or temp > 100.5).   atorvastatin  40 MG tablet Commonly known as: LIPITOR Take 1 tablet (40 mg total) by mouth daily.   oxyCODONE  5 MG immediate release tablet Commonly known as: Oxy IR/ROXICODONE  Take 1-2 tablets (5-10 mg total) by mouth every 6 (six) hours as needed for severe pain (pain score 7-10).   thiamine  100 MG tablet Commonly known as: Vitamin B-1 Take 1 tablet (100 mg total) by mouth daily.               Durable Medical Equipment  (From admission, onward)           Start     Ordered   09/06/23 1356  For home use only DME lightweight manual wheelchair with seat cushion  Once       Comments: Patient suffers from bilateral leg amputations which impairs their ability to perform daily activities like bathing, dressing, grooming, and toileting in the home.  A cane, crutch, or walker will not resolve  issue with performing activities of daily living. A wheelchair will allow patient to safely perform daily activities. Patient is not able to propel themselves in the home using a standard weight wheelchair due to endurance and general weakness. Patient can self propel in the lightweight wheelchair. Length of need Lifetime. Accessories: elevating leg rests (  ELRs), wheel locks, extensions and anti-tippers.   09/06/23 1355              Discharge Care Instructions  (From admission, onward)           Start     Ordered   09/06/23 0000  Discharge wound care:       Comments: Cover with a simple bandage. Wash daily with soap and water.   09/06/23 1234            Follow-up Information     Timothy Ford, MD Follow up in 1 week(s).   Specialty: Orthopedic Surgery Contact information: [address] [place]  [place] [address] [phone]         Trenton Frock, FNP Follow up.   Specialty: Family Medicine Contact information: [address] [place]  [address] [phone]                 Discharge Instructions     Discharge wound care:   Complete by: As directed    Cover with a simple bandage. Wash daily with soap and water.   Increase activity slowly   Complete by: As directed        Discharge Exam: Filed Weights   09/03/23 2106  Weight: 63.8 kg    General: Pt is alert, awake, not in acute distress, sitting up in bed, itneractive and appropriate, eating breakfast. Cardiovascular: RRR, nl S1-S2, no murmurs appreciated.   No peripheral edema.   Respiratory: Normal respiratory rate and rhythm.  CTAB without rales or wheezes. MSK: The right leg stump has no drainage, redness or induration.  There is a small area of lateral dehiscence. Abdominal: Abdomen soft and non-tender.  No distension or HSM.   Neuro/Psych: Strength symmetric in upper and lower extremities.  Judgment and insight appear normal.   Condition at discharge: fair  The results of significant diagnostics from this hospitalization (including imaging, microbiology, ancillary and laboratory) are listed below for reference.   Imaging Studies: CT KNEE RIGHT W CONTRAST Result Date: 09/05/2023 CLINICAL DATA:  Wound infection EXAM: CT OF THE RIGHT KNEE WITH CONTRAST TECHNIQUE: Multidetector CT imaging was performed following the standard protocol during bolus administration of intravenous contrast. RADIATION DOSE REDUCTION: This exam was performed according to the departmental dose-optimization program which includes automated exposure control, adjustment of the mA and/or kV according to patient size and/or use of iterative reconstruction technique. CONTRAST:  75mL OMNIPAQUE IOHEXOL 350 MG/ML SOLN COMPARISON:  09/04/2023 FINDINGS: Bones/Joint/Cartilage Right below the knee amputation on 08/20/2023 with skin staples still in place. No bony destructive findings. Trace periostitis along the distal fibular margin not unexpected on postoperative day 16. Partial bipartite  patella. No knee effusion. Ligaments Suboptimally assessed by CT. Muscles and Tendons Between the distal amputation margin of the tibia/fibula in the overlying wrap of muscle tissue we demonstrate a 6.4 by 2.1 by 4.2 cm (volume = 30 cm^3) fluid collection with enhancing margins. Internal fluid complex with internal density of 22 Hounsfield units. No internal gas. This appearance could reflect a postoperative fluid collection or abscess. The collection is shown for example on image 56 series 7 and image 59 series 8. Soft tissues Medial to the medial femoral epicondyle a homogeneous 2.7 by 1.3 by 2.8 cm subcutaneous lesion with internal density of 90 Hounsfield units is present, favoring a small complex cystic lesion. This could be a sebaceous cyst and does not appear thick-walled but is otherwise technically nonspecific. Correlate with chronicity of a lump at  this site. IMPRESSION: 1. 30 cc fluid collection with enhancing margins between the distal bony amputation margin of the tibia/fibula and the overlying wrap of muscle tissue. This could reflect a postoperative fluid collection or abscess. 2. No bony destructive findings. Trace periostitis along the distal fibular margin not unexpected on postoperative day 16. 3. 2.7 by 1.3 by 2.8 cm subcutaneous lesion medial to the medial femoral epicondyle with internal density of 90 Hounsfield units, favoring a small complex cystic lesion. This could be a sebaceous cyst and does not appear thick-walled but is otherwise technically nonspecific. Correlate with chronicity of a lump at this site. Electronically Signed   By: Freida Jes M.D.   On: 09/05/2023 11:19   DG Knee 2 Views Right Result Date: 09/04/2023 CLINICAL DATA:  Status post right below the knee amputation 2 weeks ago presenting with right knee pain and wound drainage. EXAM: RIGHT KNEE - 1-2 VIEW COMPARISON:  Sep 03, 2023 FINDINGS: There is evidence of prior right below the knee amputation. No evidence of  an acute fracture, dislocation, or joint effusion. No evidence of arthropathy or other focal bone abnormality. Radiopaque skin staples are seen along the stump site. Diffuse soft tissue swelling is also seen within this region. IMPRESSION: 1. Evidence of prior right below the knee amputation with diffuse soft tissue swelling seen along the stump site. This may represent sequelae associated with diffuse cellulitis. 2. No acute osseous abnormality. Electronically Signed   By: Virgle Grime M.D.   On: 09/04/2023 02:36   DG Femur Min 2 Views Right Result Date: 09/03/2023 CLINICAL DATA:  Wound infection, evaluation for deep space infection EXAM: RIGHT FEMUR 2 VIEWS COMPARISON:  Knee radiographs 06/14/2014 FINDINGS: No acute fracture or dislocation. No radiographic evidence of osteomyelitis. No evidence of soft tissue gas. IMPRESSION: No acute osseous abnormality Electronically Signed   By: Rozell Cornet M.D.   On: 09/03/2023 22:49   DG Foot 2 Views Right Result Date: 08/19/2023 CLINICAL DATA:  Right foot wound. EXAM: RIGHT FOOT - 2 VIEW COMPARISON:  01/19/2024 FINDINGS: Diffuse subcutaneous soft tissue swelling is noted but no gas is seen in the soft tissues. The bony structures are intact. No fracture or destructive bony changes to suggest osteomyelitis. No significant arthropathic findings. IMPRESSION: 1. Diffuse subcutaneous soft tissue swelling but no gas in the soft tissues. 2. No plain film findings for osteomyelitis. Electronically Signed   By: Marrian Siva M.D.   On: 08/19/2023 17:38    Microbiology: Results for orders placed or performed during the hospital encounter of 09/03/23  Culture, blood (Routine x 2)     Status: None (Preliminary result)   Collection Time: 09/03/23 10:20 PM   Specimen: BLOOD LEFT HAND  Result Value Ref Range Status   Specimen Description BLOOD LEFT HAND  Final   Special Requests   Final    BOTTLES DRAWN AEROBIC AND ANAEROBIC Blood Culture adequate volume    Culture   Final    NO GROWTH 3 DAYS Performed at [facility], [address] [place], [place] [address]    Report Status PENDING  Incomplete  Culture, blood (Routine x 2)     Status: None (Preliminary result)   Collection Time: 09/04/23  3:15 PM   Specimen: BLOOD  Result Value Ref Range Status   Specimen Description BLOOD SITE NOT SPECIFIED  Final   Special Requests   Final    BOTTLES DRAWN AEROBIC AND ANAEROBIC Blood Culture results may not be optimal due to  an inadequate volume of blood received in culture bottles   Culture   Final    NO GROWTH 2 DAYS Performed at [facility], [address] [place], [place] [address]    Report Status PENDING  Incomplete    Labs: CBC: Recent Labs  Lab 09/03/23 2119 09/04/23 1515  WBC 6.9 7.3  NEUTROABS 3.3  --   HGB 13.3 11.7*  HCT 40.8 35.6*  MCV 89.9 87.3  PLT 285 244   Basic Metabolic Panel: Recent Labs  Lab 09/03/23 2119 09/04/23 1515  NA 136  --   K 4.9  --   CL 100  --   CO2 23  --   GLUCOSE 108*  --   BUN <5*  --   CREATININE 0.59* 0.59*  CALCIUM  8.5*  --    Liver Function Tests: Recent Labs  Lab 09/03/23 2119  AST 43*  ALT 19  ALKPHOS 67  BILITOT 1.2  PROT 8.3*  ALBUMIN 2.9*   CBG: No results for input(s): "GLUCAP" in the last 168 hours.  Discharge time spent: approximately 45 minutes spent on discharge counseling, evaluation of patient on day of discharge, and coordination of discharge planning with nursing, social work, pharmacy and case management  Signed: Ephriam Hashimoto, MD [facility] 09/06/2023

## 2023-09-06 NOTE — Progress Notes (Signed)
 Patient ID: Jake Nguyen, male   DOB: 08/14/1965, 58 y.o.   MRN: [id number] Patient is 2 weeks status post right below-knee amputation.  The incision has slight dehiscence but there is no depth no drainage no signs of infection.  I will place an order for a stump shrinker patient should wear this around-the-clock and change as needed.

## 2023-09-06 NOTE — TOC Progression Note (Signed)
 Transition of Care (TOC) - Progression Note  Sherin Dingwall RN, BSN Transitions of Care Unit 4E- RN Case Manager See Treatment Team for direct phone #   Patient Details  Name: Jake Nguyen MRN: [id number] Date of Birth: 11/20/1965  Transition of Care [facility]) CM/SW Contact  Jearld Min, Myrtle Atta, RN Phone Number: 09/06/2023, 1:59 PM  Clinical Narrative:    Per MD pt stable for discharge, however pt does not have wheelchair and is R-BKA, L-AKA. In addition to being homeless with no local family support. Sister is in [place] .   CM spoke with pt at bedside this am along with CSW. Per pt he plans to go to stay at motel on [address] [place]. He will need transportation assist there.   Per pt he was picked up by EMS but they would not transport his DME (wheelchair, BSC, slide board) Pt voiced he received his DME on his last admit about 2wks ago.  Pt states that he had contacted his brother and brother was to pick up the DME and bring to the hospital but he has not seen his brother or DME.  Pt voiced that he and his brothers relationship is not on the best of terms and his brother has "burnt bridges" with him. He voiced that he and his sister are trying to work some things out but she is not local.   Attempted to call number in chart for brother Emeterio Hansen- [phone]- went straight to VM, call made to sister [name]- she also voiced she has been trying to reach Hayden but has had no luck. She will keep trying to reach him to see about wheelchair.   Spoke with [employer] leadership Margret Shepherd- as pt has insurance- is not eligible for LOG. If w/c is not found, need to see if charity w/c can be found.   Per [employer] liaison- Jermaine- pt received elevated leg rest, drop arm BSC, and slide board 08/24/23- no wheelchair,  [employer] will run insurance once order placed to see if insurance will cover new W/C.    Expected Discharge Plan: Home/Self Care Barriers to Discharge: Equipment Delay  Expected  Discharge Plan and Services In-house Referral: Clinical Social Work Discharge Planning Services: CM Consult Post Acute Care Choice: Durable Medical Equipment Living arrangements for the past 2 months: Homeless, Hotel/Motel Expected Discharge Date: 09/06/23               DME Arranged: Wheelchair manual DME Agency: [employer]       HH Arranged: NA HH Agency: NA         Social Determinants of Health (SDOH) Interventions SDOH Screenings   Food Insecurity: Food Insecurity Present (09/04/2023)  Housing: High Risk (09/04/2023)  Transportation Needs: Unmet Transportation Needs (09/04/2023)  Utilities: Not At Risk (09/04/2023)  Alcohol  Screen: Medium Risk (11/25/2020)  Depression (PHQ2-9): Low Risk  (11/06/2019)  Social Connections: Socially Isolated (07/20/2023)  Tobacco Use: High Risk (09/03/2023)    Readmission Risk Interventions     No data to display

## 2023-09-07 DIAGNOSIS — L089 Local infection of the skin and subcutaneous tissue, unspecified: Secondary | ICD-10-CM | POA: Diagnosis not present

## 2023-09-07 DIAGNOSIS — T148XXA Other injury of unspecified body region, initial encounter: Secondary | ICD-10-CM | POA: Diagnosis not present

## 2023-09-07 LAB — COMPREHENSIVE METABOLIC PANEL WITH GFR
ALT: 11 U/L (ref 0–44)
AST: 22 U/L (ref 15–41)
Albumin: 2.5 g/dL — ABNORMAL LOW (ref 3.5–5.0)
Alkaline Phosphatase: 53 U/L (ref 38–126)
Anion gap: 8 (ref 5–15)
BUN: 5 mg/dL — ABNORMAL LOW (ref 6–20)
CO2: 28 mmol/L (ref 22–32)
Calcium: 8.5 mg/dL — ABNORMAL LOW (ref 8.9–10.3)
Chloride: 102 mmol/L (ref 98–111)
Creatinine, Ser: 1.07 mg/dL (ref 0.61–1.24)
GFR, Estimated: >60 mL/min (ref >60)
Glucose, Bld: 120 mg/dL — ABNORMAL HIGH (ref 70–99)
Potassium: 3.1 mmol/L — ABNORMAL LOW (ref 3.5–5.1)
Sodium: 138 mmol/L (ref 135–145)
Total Bilirubin: 0.5 mg/dL (ref 0.0–1.2)
Total Protein: 7.4 g/dL (ref 6.5–8.1)

## 2023-09-07 LAB — CBC
HCT: 36 % — ABNORMAL LOW (ref 39.0–52.0)
Hemoglobin: 12 g/dL — ABNORMAL LOW (ref 13.0–17.0)
MCH: 28.9 pg (ref 26.0–34.0)
MCHC: 33.3 g/dL (ref 30.0–36.0)
MCV: 86.7 fL (ref 80.0–100.0)
Platelets: 193 10*3/uL (ref 150–400)
RBC: 4.15 MIL/uL — ABNORMAL LOW (ref 4.22–5.81)
RDW: 13.6 % (ref 11.5–15.5)
WBC: 7.4 10*3/uL (ref 4.0–10.5)
nRBC: 0 % (ref 0.0–0.2)

## 2023-09-07 MED ORDER — POTASSIUM CHLORIDE CRYS ER 20 MEQ PO TBCR
40.0000 meq | EXTENDED_RELEASE_TABLET | Freq: Once | ORAL | Status: AC
  Administered 2023-09-07: 40 meq via ORAL
  Filled 2023-09-07: qty 2

## 2023-09-07 MED ORDER — AMOXICILLIN-POT CLAVULANATE 875-125 MG PO TABS
1.0000 | ORAL_TABLET | Freq: Two times a day (BID) | ORAL | Status: DC
  Administered 2023-09-07 – 2023-09-09 (×5): 1 via ORAL
  Filled 2023-09-07 (×5): qty 1

## 2023-09-07 MED ORDER — DOXYCYCLINE HYCLATE 100 MG PO TABS
100.0000 mg | ORAL_TABLET | Freq: Two times a day (BID) | ORAL | Status: DC
  Administered 2023-09-07 – 2023-09-09 (×5): 100 mg via ORAL
  Filled 2023-09-07 (×5): qty 1

## 2023-09-07 NOTE — TOC Progression Note (Signed)
 Transition of Care [facility]) - Progression Note    Patient Details  Name: Jake Nguyen MRN: [id number] Date of Birth: 20-Jul-1965  Transition of Care [facility]) CM/SW Contact  Graves-Bigelow, Jari Merles, RN Phone Number: 09/07/2023, 1:38 PM  Clinical Narrative:  Case Manager has made several calls to the patients sister to get in contact with her nephew and brother that lives in [place] [place] [address]. Patient states the brother may have picked up his wheelchair at the [facility] on [employer] [address]. Case Manager called Crisis Control @ [phone] and they can only assist with donated DME for [facility] residents. [facility] [phone] and [phone] and they do not have any wheelchairs at this time. Case Manager spoke with Lem Pyle at [facility] [phone] he has one with small wheels that cannot be propelled so this will not work for this patient. Lavonia Powers states he can provide additional assistance to the patient if he has an ID, [employer], food stamp letter, and medical discharge summary. This agency is located on [address] Case Manager called the [employer] in [place] @ [phone] and the owner will be giving me a call to see if she has any wheelchairs available.     Expected Discharge Plan: Home/Self Care Barriers to Discharge: Equipment Delay  Expected Discharge Plan and Services In-house Referral: Clinical Social Work Discharge Planning Services: CM Consult Post Acute Care Choice: Durable Medical Equipment Living arrangements for the past 2 months: Homeless, Hotel/Motel Expected Discharge Date: 09/07/23               DME Arranged: Wheelchair manual DME Agency: [employer]       HH Arranged: NA HH Agency: NA         Social Determinants of Health (SDOH) Interventions SDOH Screenings   Food Insecurity: Food Insecurity Present (09/04/2023)  Housing: High Risk (09/04/2023)  Transportation Needs: Unmet Transportation Needs (09/04/2023)   Utilities: Not At Risk (09/04/2023)  Alcohol  Screen: Medium Risk (11/25/2020)  Depression (PHQ2-9): Low Risk  (11/06/2019)  Social Connections: Socially Isolated (07/20/2023)  Tobacco Use: High Risk (09/03/2023)    Readmission Risk Interventions     No data to display

## 2023-09-07 NOTE — TOC Progression Note (Signed)
 Transition of Care [facility]) - Progression Note    Patient Details  Name: Jake Nguyen MRN: [id number] Date of Birth: November 18, 1965  Transition of Care [facility]) CM/SW Contact  Valery Gaucher, [place] Phone Number: 09/07/2023, 11:32 AM  Clinical Narrative:     Informed patient TOC is working on trying to get another wheelchair. Patient states understanding. Patient requested to discharge to the [facility] shelter.   TOC swill continue to follow and assist with discharge planning.  Liddie Reel, MSW, LCSW Clinical Social Worker    Expected Discharge Plan: Home/Self Care Barriers to Discharge: Equipment Delay  Expected Discharge Plan and Services In-house Referral: Clinical Social Work Discharge Planning Services: CM Consult Post Acute Care Choice: Durable Medical Equipment Living arrangements for the past 2 months: Homeless, Hotel/Motel Expected Discharge Date: 09/07/23               DME Arranged: Wheelchair manual DME Agency: [employer]       HH Arranged: NA HH Agency: NA         Social Determinants of Health (SDOH) Interventions SDOH Screenings   Food Insecurity: Food Insecurity Present (09/04/2023)  Housing: High Risk (09/04/2023)  Transportation Needs: Unmet Transportation Needs (09/04/2023)  Utilities: Not At Risk (09/04/2023)  Alcohol  Screen: Medium Risk (11/25/2020)  Depression (PHQ2-9): Low Risk  (11/06/2019)  Social Connections: Socially Isolated (07/20/2023)  Tobacco Use: High Risk (09/03/2023)    Readmission Risk Interventions     No data to display

## 2023-09-07 NOTE — Progress Notes (Signed)
  Progress Note   Patient: Jake Nguyen [id number]:[id number] DOB: 1966-03-27 DOA: 09/03/2023     3 DOS: the patient was seen and examined on 09/07/2023 at 8:26AM      Brief hospital course: 58 y.o. M with PVD, HLD, hx EtOH, s/p left AKA 2022 and right BKA earlier this month who returned for increased pain and discharge from his surgical wound.     Assessment and Plan: Possible stump infection More red today.  VSS, no systemic symptoms.  Localized redness only. - Resume abx    Alcohol  abuse With intoxicated on admission.  Here he was initially on CIWA with Librium  taper.  This was stopped yesterday and has had no symptoms of withdrawal at all since then.   Hypokalemia - Supplement potassium       Subjective: Patient is having more discomfort and soreness at his stump today.  No fever, no nausea.  Appetite is good.     Physical Exam: BP 119/82 (BP Location: Right Arm)   Pulse 95   Temp 98.2 F (36.8 C) (Oral)   Resp 19   Ht 5\' 10"  (1.778 m)   Wt 63.8 kg   SpO2 99%   BMI 20.18 kg/m   Thin adult male, lying in bed, interactive and appropriate Lungs clear to auscultation, respiratory effort normal RRR, no murmurs Abdomen soft, nondistended, no ascites Alert and oriented, upper extremity strength appears symmetric and normal, speech is fluent There is more redness and induration around the wound today, more tenderness and soreness, especially laterally   Data Reviewed: Patient metabolic panel shows mild hypokalemia  Family Communication: none    Disposition: Status is: Inpatient Patient was admitted for stump infection.  Does not appear to need any sort of surgical debridement, and is extremely mild, okay to be on oral antibiotics.  Unfortunately EMS fetch the patient from the street, and abandoned his wheelchair.  He has no prostheses, and he has no legs, and he is not able to safely be discharged from the hospital to this treatment without some form of  wheelchair mobilization.  He has been separated from more than one wheelchair recently and insurance has so far denied coverage.  TOC are working to find charitable source.        Author: Ephriam Hashimoto, MD 09/07/2023 2:43 PM  For on call review [email or web address].

## 2023-09-08 ENCOUNTER — Other Ambulatory Visit (HOSPITAL_COMMUNITY): Payer: Self-pay

## 2023-09-08 DIAGNOSIS — L089 Local infection of the skin and subcutaneous tissue, unspecified: Secondary | ICD-10-CM | POA: Diagnosis not present

## 2023-09-08 DIAGNOSIS — T148XXA Other injury of unspecified body region, initial encounter: Secondary | ICD-10-CM | POA: Diagnosis not present

## 2023-09-08 LAB — CULTURE, BLOOD (ROUTINE X 2)
Culture: NO GROWTH
Special Requests: ADEQUATE

## 2023-09-08 MED ORDER — AMOXICILLIN-POT CLAVULANATE 875-125 MG PO TABS
1.0000 | ORAL_TABLET | Freq: Two times a day (BID) | ORAL | 0 refills | Status: DC
  Filled 2023-09-08: qty 10, 5d supply, fill #0

## 2023-09-08 MED ORDER — DOXYCYCLINE HYCLATE 100 MG PO TABS
100.0000 mg | ORAL_TABLET | Freq: Two times a day (BID) | ORAL | 0 refills | Status: DC
  Filled 2023-09-08: qty 10, 5d supply, fill #0

## 2023-09-08 NOTE — Progress Notes (Signed)
  Progress Note   Patient: JUDGE DUQUE QMV:[id number] DOB: 04-10-1966 DOA: 09/03/2023     4 DOS: the patient was seen and examined on 09/08/2023        Brief hospital course: 58 y.o. M with PVD, HLD, hx EtOH, s/p left AKA 2022 and right BKA earlier this month who returned for increased pain and discharge from his surgical wound.     Assessment and Plan: Possible stump infection - Continue Augmentin  and doxycycline             Subjective: Feeling okay, no change to stump, still sore.     Physical Exam: BP 112/70 (BP Location: Right Arm)   Pulse 86   Temp 98.3 F (36.8 C) (Oral)   Resp 20   Ht 5\' 10"  (1.778 m)   Wt 63.8 kg   SpO2 95%   BMI 20.18 kg/m   Adult male, bilateral amputee, poor hygiene, normal mentation.  Appears comfortable, no signs of tremor or sweats or discomfort. Stump appears benign.   Data Reviewed:    Family Communication: none    Disposition: Status is: Inpatient Discussed with case management, no safe discharge plan is available yet, hopefully tomorrow        Author: Ephriam Hashimoto, MD 09/08/2023 5:09 PM  For on call review [email or web address].

## 2023-09-08 NOTE — Progress Notes (Signed)
 Physical Therapy Treatment Patient Details Name: Jake Nguyen MRN: [id number] DOB: 1965-12-22 Today's Date: 09/08/2023   History of Present Illness Pt is a 58 y.o. male who presented 09/03/23 due to RLE wound infection. Pt had a recent admission on 5/6-01/2024 with worsening RLE pain and infection, noted maggots and gangrene and s/p R BKA.  PMH includes L AKA (07/2020), PAD, anxiety, ETOH abuse, tobacco abuse, homelessness (~2 years)    PT Comments  Pt resting in bed on arrival, max encouragement provided for OOB mobility, with pt declining. Pt agreeable to bed level session with focus on education for pressure relief techniques as pt reporting pain at buttocks from supine positioning. Pt able to demonstrate rolling for pressure relief with supervision and verbalizing understanding of frequency. Pt also educated on floating distal end of residual limb on R as pt reporting pain where contacting mattress, pt verbalizing relief with repositioning. Pt able to demonstrating RLE exercises to maintain ROM and strength. Pt continues to benefit from skilled PT services to progress toward functional mobility goals.     If plan is discharge home, recommend the following: A lot of help with walking and/or transfers;A lot of help with bathing/dressing/bathroom;Assist for transportation;Help with stairs or ramp for entrance;Assistance with cooking/housework   Can travel by private vehicle     No  Equipment Recommendations  Wheelchair (measurements PT);Wheelchair cushion (measurements PT)    Recommendations for Other Services       Precautions / Restrictions Precautions Precautions: Fall Recall of Precautions/Restrictions: Impaired Precaution/Restrictions Comments: pt arrived with maggots in R residual limb incision Restrictions Weight Bearing Restrictions Per Provider Order: Yes RLE Weight Bearing Per Provider Order: Non weight bearing Other Position/Activity Restrictions: hx of L AKA, has a limb  protector for R LE     Mobility  Bed Mobility Overal bed mobility: Needs Assistance Bed Mobility: Rolling Rolling: Supervision         General bed mobility comments: spervision to roll for demonstration of pressure relief techniques as pt stating discomfot at bottom    Transfers                   General transfer comment: pt declining transfer to chair    Ambulation/Gait                   Stairs             Wheelchair Mobility     Tilt Bed    Modified Rankin (Stroke Patients Only)       Balance Overall balance assessment: Needs assistance Sitting-balance support: Bilateral upper extremity supported Sitting balance-Leahy Scale: Fair                                      Hotel manager: No apparent difficulties  Cognition Arousal: Alert Behavior During Therapy: WFL for tasks assessed/performed                             Following commands: Intact      Cueing Cueing Techniques: Verbal cues, Gestural cues  Exercises Amputee Exercises Quad Sets: AROM, Right, 5 reps, Supine Hip ABduction/ADduction: AROM, Right, 5 reps, Supine Straight Leg Raises: AROM, Right, 5 reps, Supine    General Comments General comments (skin integrity, edema, etc.): VSS, educated pt on importance of off-weighting buttocks with rolling in supine and need to  frequently change positions, educated on positioing of RLE foating residual limb up on pillow as pt with pain on distal posterior aspect in contact with matress      Pertinent Vitals/Pain Pain Assessment Pain Assessment: Faces Faces Pain Scale: Hurts little more Pain Location: RLE, bottom Pain Descriptors / Indicators: Grimacing, Guarding, Sore, Discomfort Pain Intervention(s): Repositioned, Monitored during session, Limited activity within patient's tolerance    Home Living                          Prior Function            PT Goals  (current goals can now be found in the care plan section) Acute Rehab PT Goals PT Goal Formulation: With patient Time For Goal Achievement: 09/20/23 Progress towards PT goals: Not progressing toward goals - comment    Frequency    Min 2X/week      PT Plan      Co-evaluation              AM-PAC PT "6 Clicks" Mobility   Outcome Measure  Help needed turning from your back to your side while in a flat bed without using bedrails?: A Little Help needed moving from lying on your back to sitting on the side of a flat bed without using bedrails?: A Little Help needed moving to and from a bed to a chair (including a wheelchair)?: A Lot Help needed standing up from a chair using your arms (e.g., wheelchair or bedside chair)?: Total Help needed to walk in hospital room?: Total Help needed climbing 3-5 steps with a railing? : Total 6 Click Score: 11    End of Session   Activity Tolerance: Patient tolerated treatment well Patient left: in bed;with call bell/phone within reach Nurse Communication: Mobility status PT Visit Diagnosis: Muscle weakness (generalized) (M62.81);Difficulty in walking, not elsewhere classified (R26.2);Pain Pain - Right/Left: Right Pain - part of body: Leg     Time: 0865-7846 PT Time Calculation (min) (ACUTE ONLY): 10 min  Charges:    $Therapeutic Activity: 8-22 mins PT General Charges $$ ACUTE PT VISIT: 1 Visit                     Jake Nguyen R. PTA Acute Rehabilitation Services Office: [phone]   Jake Nguyen 09/08/2023, 3:58 PM

## 2023-09-08 NOTE — TOC Progression Note (Addendum)
 Transition of Care [facility]) - Progression Note    Patient Details  Name: Jake Nguyen MRN: [id number] Date of Birth: 12/28/1965  Transition [facility] Care [facility]) CM/SW Contact  Graves-Bigelow, Jari Merles, RN Phone Number: 09/08/2023, 11:21 AM  Clinical Narrative:  Case Manager checked several Good Will's today and no wheelchairs are available. Case Manager called Thersia Flax @ the [employer] and she has asked the Case Manager to call her back at 4:30 to make sure she can get the wheelchair. MD is agreeable to meet Thersia Flax and discuss a meeting location. Case Manager will continue to follow for additional needs.    1552 09-08-23 MD to pick up the wheelchair from [employer] on [address] and deliver back to the hospital. No further needs identified.   Expected Discharge Plan: Home/Self Care Barriers to Discharge: Equipment Delay  Expected Discharge Plan and Services In-house Referral: Clinical Social Work Discharge Planning Services: CM Consult Post Acute Care Choice: Durable Medical Equipment Living arrangements for the past 2 months: Homeless, Hotel/Motel Expected Discharge Date: 09/07/23               DME Arranged: Wheelchair manual DME Agency: [employer]       HH Arranged: NA HH Agency: NA   Social Determinants of Health (SDOH) Interventions SDOH Screenings   Food Insecurity: Food Insecurity Present (09/04/2023)  Housing: High Risk (09/04/2023)  Transportation Needs: Unmet Transportation Needs (09/04/2023)  Utilities: Not At Risk (09/04/2023)  Alcohol  Screen: Medium Risk (11/25/2020)  Depression (PHQ2-9): Low Risk  (11/06/2019)  Social Connections: Socially Isolated (07/20/2023)  Tobacco Use: High Risk (09/03/2023)    Readmission Risk Interventions     No data to display

## 2023-09-08 NOTE — TOC Progression Note (Addendum)
 Transition of Care [facility]) - Progression Note    Patient Details  Name: Jake Nguyen MRN: [id number] Date of Birth: 1965-12-17  Transition of Care [facility]) CM/SW Contact  Valery Gaucher, [place] Phone Number: 09/08/2023, 5:20 PM  Clinical Narrative:     5:20 pm informed  patient is stable per MD- TOC will provide cab voucher( cab will assist w/ wheelchair) or d/c lounge to the [facility]- patient also asked for bus pass which was provided.   5:30 pm advised patient he will need to apply for LTC medicaid if he is interested in ALF. Explain application could take 30-90 days before determination is made. Informed patient to apply at DSS or ask for assist at the [facility]. Patient has been to [facility] in the past but states he realized " I burnt that bridge" when he signed himself and never returned back. CSW also explained per [facility] supervisor, SNF not accepting LOG at this time. Patient states understanding.   TOC anticipate d/c tomorrow   Liddie Reel, MSW, LCSW Clinical Social Worker    Expected Discharge Plan: Home/Self Care Barriers to Discharge: Equipment Delay  Expected Discharge Plan and Services In-house Referral: Clinical Social Work Discharge Planning Services: CM Consult Post Acute Care Choice: Durable Medical Equipment Living arrangements for the past 2 months: Homeless, Hotel/Motel Expected Discharge Date: 09/07/23               DME Arranged: Wheelchair manual DME Agency: [employer]       HH Arranged: NA HH Agency: NA         Social Determinants of Health (SDOH) Interventions SDOH Screenings   Food Insecurity: Food Insecurity Present (09/04/2023)  Housing: High Risk (09/04/2023)  Transportation Needs: Unmet Transportation Needs (09/04/2023)  Utilities: Not At Risk (09/04/2023)  Alcohol  Screen: Medium Risk (11/25/2020)  Depression (PHQ2-9): Low Risk  (11/06/2019)  Social Connections: Socially Isolated (07/20/2023)  Tobacco Use: High Risk  (09/03/2023)    Readmission Risk Interventions     No data to display

## 2023-09-09 ENCOUNTER — Other Ambulatory Visit (HOSPITAL_COMMUNITY): Payer: Self-pay

## 2023-09-09 DIAGNOSIS — T148XXA Other injury of unspecified body region, initial encounter: Secondary | ICD-10-CM | POA: Diagnosis not present

## 2023-09-09 DIAGNOSIS — L089 Local infection of the skin and subcutaneous tissue, unspecified: Secondary | ICD-10-CM | POA: Diagnosis not present

## 2023-09-09 LAB — CULTURE, BLOOD (ROUTINE X 2): Culture: NO GROWTH

## 2023-09-09 MED ORDER — TRIAMCINOLONE ACETONIDE 0.1 % EX CREA
1.0000 | TOPICAL_CREAM | Freq: Three times a day (TID) | CUTANEOUS | 0 refills | Status: DC
  Filled 2023-09-09: qty 90, 30d supply, fill #0

## 2023-09-09 NOTE — Discharge Summary (Signed)
 Physician Discharge Summary   Patient: Jake Nguyen MRN: [id number] DOB: [birth year]  Admit date:     09/03/2023  Discharge date: 09/09/23  Discharge Physician: Ephriam Hashimoto   PCP: Trenton Frock, FNP     Recommendations at discharge:  Follow up with Dr. Julio Ohm in the office     Discharge Diagnoses: Principal Problem:   Right BKA Wound infection Active Problems:   S/P bilateral BKA (below knee amputation) (HCC)   Alcohol  use disorder, severe, dependence (HCC)   Depression with anxiety   Eczema     Hospital Course: 58 y.o. M with PVD, HLD, hx EtOH, s/p left AKA 2022 and right BKA earlier this month who returned for increased pain from his surgical wound.    Minor BKA stump dehiscence Alcohol  abuse Mild alcohol  withdrawal Eczema Patient was admitted and started on broad-spectrum antibiotics.  No leukocytosis or fever.  CT of the leg was obtained that showed expected postop fluid collection.  Orthopedics evaluated the leg, wounds appeared well healed and there was no redness, induration or drainage.  Antibiotics were stopped.  Shrinker was ordered.    There was a two day delay in arranging adequate discharge equipment to accommodate patient's request to discharge to the street.  During that time, he had no signs of withdrawal at all.  He did however have new redness, pain and swelling of the stump, very mild, and so was given a course of 7 days oral antibiotics.  Also given emollients and triamcinolone  cream for his eczema.  All prescriptions provided prior to discharge.  Provided with information for Dr. Aniceto Kern office, should he choose to follow up.              The [place]  Controlled Substances Registry was reviewed for this patient prior to discharge.   Consultants: Orthopedics      DISCHARGE MEDICATION: Allergies as of 09/09/2023       Reactions   Pork-derived Products Other (See Comments)   Pt states he doesn't eat pork         Medication List     TAKE these medications    acetaminophen  325 MG tablet Commonly known as: TYLENOL  Take 1-2 tablets (325-650 mg total) by mouth every 6 (six) hours as needed for mild pain (pain score 1-3) (or temp > 100.5).   amoxicillin -clavulanate 875-125 MG tablet Commonly known as: AUGMENTIN  Take 1 tablet by mouth every 12 (twelve) hours.   atorvastatin  40 MG tablet Commonly known as: LIPITOR Take 1 tablet (40 mg total) by mouth daily.   doxycycline  100 MG tablet Commonly known as: VIBRA -TABS Take 1 tablet (100 mg total) by mouth every 12 (twelve) hours.   oxyCODONE  5 MG immediate release tablet Commonly known as: Oxy IR/ROXICODONE  Take 1-2 tablets (5-10 mg total) by mouth every 6 (six) hours as needed for severe pain (pain score 7-10).   thiamine  100 MG tablet Commonly known as: Vitamin B-1 Take 1 tablet (100 mg total) by mouth daily.   triamcinolone  cream 0.1 % Commonly known as: KENALOG  Apply topically to affected area three times daily (Apply to the affected area(s) of the skin three times daily as directed)               Durable Medical Equipment  (From admission, onward)           Start     Ordered   09/06/23 1356  For home use only DME lightweight manual wheelchair with seat cushion  Once  Comments: Patient suffers from bilateral leg amputations which impairs their ability to perform daily activities like bathing, dressing, grooming, and toileting in the home.  A cane, crutch, or walker will not resolve  issue with performing activities of daily living. A wheelchair will allow patient to safely perform daily activities. Patient is not able to propel themselves in the home using a standard weight wheelchair due to endurance and general weakness. Patient can self propel in the lightweight wheelchair. Length of need Lifetime. Accessories: elevating leg rests (ELRs), wheel locks, extensions and anti-tippers.   09/06/23 1355               Discharge Care Instructions  (From admission, onward)           Start     Ordered   09/06/23 0000  Discharge wound care:       Comments: Cover with a simple bandage. Wash daily with soap and water.   09/06/23 1234            Follow-up Information     Timothy Ford, MD Follow up in 1 week(s).   Specialty: Orthopedic Surgery Contact information: [address] [place] [address] [phone]         Trenton Frock, FNP Follow up.   Specialty: Family Medicine Contact information: [address] [place] [address] [phone]                 Discharge Instructions     Discharge wound care:   Complete by: As directed    Cover with a simple bandage. Wash daily with soap and water.   Increase activity slowly   Complete by: As directed        Discharge Exam: Filed Weights   09/03/23 2106  Weight: 63.8 kg    General: Pt is alert, awake, not in acute distress, sitting up in bed, itneractive and appropriate, eating breakfast. Cardiovascular: RRR, nl S1-S2, no murmurs appreciated.   No peripheral edema.   Respiratory: Normal respiratory rate and rhythm.  CTAB without rales or wheezes. Skin/MSK: The right leg stump has no drainage, redness or induration.  He has diffuse eczema.  Including dry skin, flaking of the stump.  The stump wound looks clean dry and intact. Abdominal: Abdomen soft and non-tender.  No distension or HSM.   Neuro/Psych: Strength symmetric in upper and lower extremities.  Judgment and insight appear normal.   Condition at discharge: fair  The results of significant diagnostics from this hospitalization (including imaging, microbiology, ancillary and laboratory) are listed below for reference.   Imaging Studies: CT KNEE RIGHT W CONTRAST Result Date: 09/05/2023 CLINICAL DATA:  Wound infection EXAM: CT OF THE RIGHT KNEE WITH CONTRAST TECHNIQUE: Multidetector CT imaging was performed following the standard protocol during  bolus administration of intravenous contrast. RADIATION DOSE REDUCTION: This exam was performed according to the departmental dose-optimization program which includes automated exposure control, adjustment of the mA and/or kV according to patient size and/or use of iterative reconstruction technique. CONTRAST:  75mL OMNIPAQUE  IOHEXOL  350 MG/ML SOLN COMPARISON:  09/04/2023 FINDINGS: Bones/Joint/Cartilage Right below the knee amputation on 08/20/2023 with skin staples still in place. No bony destructive findings. Trace periostitis along the distal fibular margin not unexpected on postoperative day 16. Partial bipartite patella. No knee effusion. Ligaments Suboptimally assessed by CT. Muscles and Tendons Between the distal amputation margin of the tibia/fibula in the overlying wrap of muscle tissue we demonstrate a 6.4 by 2.1 by 4.2 cm (volume = 30  cm^3) fluid collection with enhancing margins. Internal fluid complex with internal density of 22 Hounsfield units. No internal gas. This appearance could reflect a postoperative fluid collection or abscess. The collection is shown for example on image 56 series 7 and image 59 series 8. Soft tissues Medial to the medial femoral epicondyle a homogeneous 2.7 by 1.3 by 2.8 cm subcutaneous lesion with internal density of 90 Hounsfield units is present, favoring a small complex cystic lesion. This could be a sebaceous cyst and does not appear thick-walled but is otherwise technically nonspecific. Correlate with chronicity of a lump at this site. IMPRESSION: 1. 30 cc fluid collection with enhancing margins between the distal bony amputation margin of the tibia/fibula and the overlying wrap of muscle tissue. This could reflect a postoperative fluid collection or abscess. 2. No bony destructive findings. Trace periostitis along the distal fibular margin not unexpected on postoperative day 16. 3. 2.7 by 1.3 by 2.8 cm subcutaneous lesion medial to the medial femoral epicondyle with  internal density of 90 Hounsfield units, favoring a small complex cystic lesion. This could be a sebaceous cyst and does not appear thick-walled but is otherwise technically nonspecific. Correlate with chronicity of a lump at this site. Electronically Signed   By: Freida Jes M.D.   On: 09/05/2023 11:19   DG Knee 2 Views Right Result Date: 09/04/2023 CLINICAL DATA:  Status post right below the knee amputation 2 weeks ago presenting with right knee pain and wound drainage. EXAM: RIGHT KNEE - 1-2 VIEW COMPARISON:  Sep 03, 2023 FINDINGS: There is evidence of prior right below the knee amputation. No evidence of an acute fracture, dislocation, or joint effusion. No evidence of arthropathy or other focal bone abnormality. Radiopaque skin staples are seen along the stump site. Diffuse soft tissue swelling is also seen within this region. IMPRESSION: 1. Evidence of prior right below the knee amputation with diffuse soft tissue swelling seen along the stump site. This may represent sequelae associated with diffuse cellulitis. 2. No acute osseous abnormality. Electronically Signed   By: Virgle Grime M.D.   On: 09/04/2023 02:36   DG Femur Min 2 Views Right Result Date: 09/03/2023 CLINICAL DATA:  Wound infection, evaluation for deep space infection EXAM: RIGHT FEMUR 2 VIEWS COMPARISON:  Knee radiographs 06/14/2014 FINDINGS: No acute fracture or dislocation. No radiographic evidence of osteomyelitis. No evidence of soft tissue gas. IMPRESSION: No acute osseous abnormality Electronically Signed   By: Rozell Cornet M.D.   On: 09/03/2023 22:49   DG Foot 2 Views Right Result Date: 08/19/2023 CLINICAL DATA:  Right foot wound. EXAM: RIGHT FOOT - 2 VIEW COMPARISON:  01/19/2024 FINDINGS: Diffuse subcutaneous soft tissue swelling is noted but no gas is seen in the soft tissues. The bony structures are intact. No fracture or destructive bony changes to suggest osteomyelitis. No significant arthropathic findings.  IMPRESSION: 1. Diffuse subcutaneous soft tissue swelling but no gas in the soft tissues. 2. No plain film findings for osteomyelitis. Electronically Signed   By: Marrian Siva M.D.   On: 08/19/2023 17:38    Microbiology: Results for orders placed or performed during the hospital encounter of 09/03/23  Culture, blood (Routine x 2)     Status: None   Collection Time: 09/03/23 10:20 PM   Specimen: BLOOD LEFT HAND  Result Value Ref Range Status   Specimen Description BLOOD LEFT HAND  Final   Special Requests   Final    BOTTLES DRAWN AEROBIC AND ANAEROBIC Blood Culture adequate volume  Culture   Final    NO GROWTH 5 DAYS Performed at [facility], [address], [place], [place] [address]    Report Status 09/08/2023 FINAL  Final  Culture, blood (Routine x 2)     Status: None   Collection Time: 09/04/23  3:15 PM   Specimen: BLOOD  Result Value Ref Range Status   Specimen Description BLOOD SITE NOT SPECIFIED  Final   Special Requests   Final    BOTTLES DRAWN AEROBIC AND ANAEROBIC Blood Culture results may not be optimal due to an inadequate volume of blood received in culture bottles   Culture   Final    NO GROWTH 5 DAYS Performed at [facility], [address] [place], [place] [address]    Report Status 09/09/2023 FINAL  Final    Labs: CBC: Recent Labs  Lab 09/03/23 2119 09/04/23 1515 09/07/23 0341  WBC 6.9 7.3 7.4  NEUTROABS 3.3  --   --   HGB 13.3 11.7* 12.0*  HCT 40.8 35.6* 36.0*  MCV 89.9 87.3 86.7  PLT 285 244 193   Basic Metabolic Panel: Recent Labs  Lab 09/03/23 2119 09/04/23 1515 09/07/23 0341  NA 136  --  138  K 4.9  --  3.1*  CL 100  --  102  CO2 23  --  28  GLUCOSE 108*  --  120*  BUN <5*  --  5*  CREATININE 0.59* 0.59* 1.07  CALCIUM  8.5*  --  8.5*   Liver Function Tests: Recent Labs  Lab 09/03/23 2119 09/07/23 0341  AST 43* 22  ALT 19 11  ALKPHOS 67 53  BILITOT 1.2 0.5  PROT 8.3* 7.4  ALBUMIN 2.9* 2.5*   CBG: No results  for input(s): "GLUCAP" in the last 168 hours.  Discharge time spent: approximately 45 minutes spent on discharge counseling, evaluation of patient on day of discharge, and coordination of discharge planning with nursing, social work, pharmacy and case management  Signed: Ephriam Hashimoto, MD [facility] 09/09/2023

## 2023-09-09 NOTE — Progress Notes (Signed)
 Patient stated that he was going to see if [facility] could accommodate him. Discharge instructions and next steps were provided. He was given prescription, transported in wheel chair.

## 2023-09-16 ENCOUNTER — Encounter (HOSPITAL_COMMUNITY): Payer: Self-pay

## 2023-09-16 ENCOUNTER — Emergency Department (HOSPITAL_COMMUNITY)
Admission: EM | Admit: 2023-09-16 | Discharge: 2023-09-17 | Disposition: A | Discharge status: Home / Self Care | Payer: MEDICAID | Attending: Emergency Medicine | Admitting: Emergency Medicine

## 2023-09-16 DIAGNOSIS — Y92481 Parking lot as the place of occurrence of the external cause: Secondary | ICD-10-CM | POA: Diagnosis not present

## 2023-09-16 DIAGNOSIS — S0081XA Abrasion of other part of head, initial encounter: Secondary | ICD-10-CM | POA: Insufficient documentation

## 2023-09-16 DIAGNOSIS — F10129 Alcohol abuse with intoxication, unspecified: Secondary | ICD-10-CM | POA: Diagnosis present

## 2023-09-16 DIAGNOSIS — R000 Tachycardia, unspecified: Secondary | ICD-10-CM | POA: Diagnosis not present

## 2023-09-16 DIAGNOSIS — Y908 Blood alcohol level of 240 mg/100 ml or more: Secondary | ICD-10-CM | POA: Diagnosis not present

## 2023-09-16 DIAGNOSIS — F10920 Alcohol use, unspecified with intoxication, uncomplicated: Secondary | ICD-10-CM

## 2023-09-16 LAB — COMPREHENSIVE METABOLIC PANEL WITH GFR
ALT: 20 U/L (ref 0–44)
AST: 40 U/L (ref 15–41)
Albumin: 2.7 g/dL — ABNORMAL LOW (ref 3.5–5.0)
Alkaline Phosphatase: 73 U/L (ref 38–126)
Anion gap: 13 (ref 5–15)
BUN: <5 mg/dL — ABNORMAL LOW (ref 6–20)
CO2: 20 mmol/L — ABNORMAL LOW (ref 22–32)
Calcium: 8 mg/dL — ABNORMAL LOW (ref 8.9–10.3)
Chloride: 101 mmol/L (ref 98–111)
Creatinine, Ser: 0.69 mg/dL (ref 0.61–1.24)
GFR, Estimated: >60 mL/min (ref >60)
Glucose, Bld: 145 mg/dL — ABNORMAL HIGH (ref 70–99)
Potassium: 3.1 mmol/L — ABNORMAL LOW (ref 3.5–5.1)
Sodium: 134 mmol/L — ABNORMAL LOW (ref 135–145)
Total Bilirubin: 0.6 mg/dL (ref 0.0–1.2)
Total Protein: 8.3 g/dL — ABNORMAL HIGH (ref 6.5–8.1)

## 2023-09-16 LAB — CBC
HCT: 36 % — ABNORMAL LOW (ref 39.0–52.0)
Hemoglobin: 11.9 g/dL — ABNORMAL LOW (ref 13.0–17.0)
MCH: 29 pg (ref 26.0–34.0)
MCHC: 33.1 g/dL (ref 30.0–36.0)
MCV: 87.6 fL (ref 80.0–100.0)
Platelets: 325 10*3/uL (ref 150–400)
RBC: 4.11 MIL/uL — ABNORMAL LOW (ref 4.22–5.81)
RDW: 13.2 % (ref 11.5–15.5)
WBC: 10 10*3/uL (ref 4.0–10.5)
nRBC: 0 % (ref 0.0–0.2)

## 2023-09-16 LAB — SALICYLATE LEVEL: Salicylate Lvl: <7 mg/dL — ABNORMAL LOW (ref 7.0–30.0)

## 2023-09-16 LAB — ETHANOL: Alcohol, Ethyl (B): 269 mg/dL — ABNORMAL HIGH (ref <15)

## 2023-09-16 LAB — ACETAMINOPHEN LEVEL: Acetaminophen (Tylenol), Serum: <10 ug/mL — ABNORMAL LOW (ref 10–30)

## 2023-09-16 NOTE — ED Triage Notes (Signed)
 EMS reports patient is ETOH with syncopal episode and rolled out of his wheelchair. Patient admitted to drinking 3- 40oz beers. Patient is homeless and picked up outside of the [employer].

## 2023-09-17 ENCOUNTER — Emergency Department (HOSPITAL_COMMUNITY): Payer: MEDICAID

## 2023-09-17 LAB — RAPID URINE DRUG SCREEN, HOSP PERFORMED
Amphetamines: NOT DETECTED
Barbiturates: NOT DETECTED
Benzodiazepines: POSITIVE — AB
Cocaine: NOT DETECTED
Opiates: NOT DETECTED
Tetrahydrocannabinol: POSITIVE — AB

## 2023-09-17 MED ORDER — SODIUM CHLORIDE 0.9 % IV BOLUS
1000.0000 mL | Freq: Once | INTRAVENOUS | Status: AC
  Administered 2023-09-17: 1000 mL via INTRAVENOUS

## 2023-09-17 MED ORDER — LORAZEPAM 2 MG/ML IJ SOLN
1.0000 mg | Freq: Once | INTRAMUSCULAR | Status: AC
  Administered 2023-09-17: 1 mg via INTRAVENOUS
  Filled 2023-09-17: qty 1

## 2023-09-17 MED ORDER — LORAZEPAM 2 MG/ML IJ SOLN
0.5000 mg | Freq: Once | INTRAMUSCULAR | Status: AC
  Administered 2023-09-17: 0.5 mg via INTRAVENOUS
  Filled 2023-09-17: qty 1

## 2023-09-17 NOTE — ED Notes (Signed)
 Introduced self to pt. Pt given 2 pillows for comfort. Pt requested soda which was given.

## 2023-09-17 NOTE — Discharge Instructions (Signed)
 Refrain from drinking to excess.  This is bad for you. I have attached list of resources if you want help with this. Follow-up with your doctor. Return here for new concerns.

## 2023-09-17 NOTE — ED Provider Notes (Signed)
 [place] EMERGENCY DEPARTMENT AT [facility] Provider Note   CSN: [id number] Arrival date & time: 09/16/23  2135     History  Chief Complaint  Patient presents with   Alcohol  Intoxication    Jake Nguyen is a 58 y.o. male.  The history is provided by the patient and medical records.  Alcohol  Intoxication   58 year old male with history of alcohol  abuse, depression, hx of bilateral BKA's, presenting to the ED after fall from his wheelchair in the [employer] parking lot.  Reportedly he was there buying soda and potato chips and when he got in the parking lot he fell from his chair on his head.  He denies loss of consciousness.  He is quite intoxicated, admits to drinking 3 40oz beers PTA.  Patient tachycardic on arrival. Has recently had infections of his right stump-- states friend is helping change dressings, has taken his abx.  Home Medications Prior to Admission medications   Medication Sig Start Date End Date Taking? Authorizing Provider  acetaminophen  (TYLENOL ) 325 MG tablet Take 1-2 tablets (325-650 mg total) by mouth every 6 (six) hours as needed for mild pain (pain score 1-3) (or temp > 100.5). Patient not taking: Reported on 09/04/2023 08/24/23   Elgergawy, Ardia Kraft, MD  amoxicillin -clavulanate (AUGMENTIN ) 875-125 MG tablet Take 1 tablet by mouth every 12 (twelve) hours. 09/08/23   Danford, Willis Harter, MD  atorvastatin  (LIPITOR) 40 MG tablet Take 1 tablet (40 mg total) by mouth daily. Patient not taking: Reported on 09/04/2023 08/24/23   Elgergawy, Ardia Kraft, MD  doxycycline  (VIBRA -TABS) 100 MG tablet Take 1 tablet (100 mg total) by mouth every 12 (twelve) hours. 09/08/23   Danford, Willis Harter, MD  oxyCODONE  (OXY IR/ROXICODONE ) 5 MG immediate release tablet Take 1-2 tablets (5-10 mg total) by mouth every 6 (six) hours as needed for severe pain (pain score 7-10). Patient not taking: Reported on 09/04/2023 08/24/23   Elgergawy, Ardia Kraft, MD  thiamine  (VITAMIN B-1)  100 MG tablet Take 1 tablet (100 mg total) by mouth daily. Patient not taking: Reported on 09/04/2023 08/25/23   Elgergawy, Ardia Kraft, MD  triamcinolone  cream (KENALOG ) 0.1 % Apply to the affected area(s) of the skin three times daily as directed 09/09/23   Danford, Willis Harter, MD      Allergies    Pork-derived products    Review of Systems   Review of Systems  Constitutional:        ETOH  All other systems reviewed and are negative.   Physical Exam Updated Vital Signs BP 114/75   Pulse (!) 136   Temp 98.5 F (36.9 C) (Oral)   Resp 18   SpO2 97%   Physical Exam Vitals and nursing note reviewed.  Constitutional:      Appearance: He is well-developed.     Comments: Awake, alert, talkative on exam, eating potato chips  HENT:     Head: Normocephalic and atraumatic.     Comments: Abrasions to forehead and top of scalp Eyes:     Conjunctiva/sclera: Conjunctivae normal.     Pupils: Pupils are equal, round, and reactive to light.  Cardiovascular:     Rate and Rhythm: Regular rhythm. Tachycardia present.     Heart sounds: Normal heart sounds.     Comments: Persistently tachycardic into 130's during exam Pulmonary:     Effort: Pulmonary effort is normal. No respiratory distress.     Breath sounds: Normal breath sounds. No rhonchi.  Musculoskeletal:  General: Normal range of motion.     Cervical back: Normal range of motion.     Comments: Right stump dressing taken down-- wound overall appears dry without significant drainage, some sutures remain intact from most recent procedure, no tissue crepitus  Skin:    General: Skin is warm and dry.  Neurological:     Mental Status: He is alert and oriented to person, place, and time.          ED Results / Procedures / Treatments   Labs (all labs ordered are listed, but only abnormal results are displayed) Labs Reviewed  COMPREHENSIVE METABOLIC PANEL WITH GFR - Abnormal; Notable for the following components:      Result  Value   Sodium 134 (*)    Potassium 3.1 (*)    CO2 20 (*)    Glucose, Bld 145 (*)    BUN <5 (*)    Calcium  8.0 (*)    Total Protein 8.3 (*)    Albumin 2.7 (*)    All other components within normal limits  ETHANOL - Abnormal; Notable for the following components:   Alcohol , Ethyl (B) 269 (*)    All other components within normal limits  CBC - Abnormal; Notable for the following components:   RBC 4.11 (*)    Hemoglobin 11.9 (*)    HCT 36.0 (*)    All other components within normal limits  SALICYLATE LEVEL - Abnormal; Notable for the following components:   Salicylate Lvl <7.0 (*)    All other components within normal limits  ACETAMINOPHEN  LEVEL - Abnormal; Notable for the following components:   Acetaminophen  (Tylenol ), Serum <10 (*)    All other components within normal limits  RAPID URINE DRUG SCREEN, HOSP PERFORMED    EKG EKG Interpretation Date/Time:  Saturday September 16 2023 22:06:14 EDT Ventricular Rate:  139 PR Interval:  135 QRS Duration:  84 QT Interval:  294 QTC Calculation: 447 R Axis:   79  Text Interpretation: Sinus tachycardia Confirmed by Iva Mariner [phone]) on 09/17/2023 1:42:02 AM  Radiology CT HEAD WO CONTRAST ( ) Result Date: 09/17/2023 CLINICAL DATA:  Marvell Slider on head from wheelchair. Abrasions on forehead. Intoxicated. Neck trauma, dangerous injury mechanism EXAM: CT HEAD WITHOUT CONTRAST CT CERVICAL SPINE WITHOUT CONTRAST TECHNIQUE: Multidetector CT imaging of the head and cervical spine was performed following the standard protocol without intravenous contrast. Multiplanar CT image reconstructions of the cervical spine were also generated. RADIATION DOSE REDUCTION: This exam was performed according to the departmental dose-optimization program which includes automated exposure control, adjustment of the mA and/or kV according to patient size and/or use of iterative reconstruction technique. COMPARISON:  CT cervical spine 08/02/2009 and CT head 03/19/2021 FINDINGS:  CT HEAD FINDINGS Brain: No intracranial hemorrhage, mass effect, or evidence of acute infarct. No hydrocephalus. No extra-axial fluid collection. Mild cerebral atrophy. Vascular: No hyperdense vessel. Intracranial arterial calcification. Skull: No fracture or focal lesion. Sinuses/Orbits: No acute finding. Other: None. CT CERVICAL SPINE FINDINGS Alignment: No evidence of traumatic malalignment. Skull base and vertebrae: No acute fracture. No primary bone lesion or focal pathologic process. Soft tissues and spinal canal: No prevertebral fluid or swelling. No visible canal hematoma. Disc levels: Intervertebral disc space height is maintained. No severe spinal canal narrowing. Upper chest: Emphysema. Other: None. IMPRESSION: 1. No acute intracranial abnormality. 2. No acute fracture in the cervical spine. Electronically Signed   By: Rozell Cornet M.D.   On: 09/17/2023 04:00   CT Cervical Spine Wo Contrast Result  Date: 09/17/2023 CLINICAL DATA:  Marvell Slider on head from wheelchair. Abrasions on forehead. Intoxicated. Neck trauma, dangerous injury mechanism EXAM: CT HEAD WITHOUT CONTRAST CT CERVICAL SPINE WITHOUT CONTRAST TECHNIQUE: Multidetector CT imaging of the head and cervical spine was performed following the standard protocol without intravenous contrast. Multiplanar CT image reconstructions of the cervical spine were also generated. RADIATION DOSE REDUCTION: This exam was performed according to the departmental dose-optimization program which includes automated exposure control, adjustment of the mA and/or kV according to patient size and/or use of iterative reconstruction technique. COMPARISON:  CT cervical spine 08/02/2009 and CT head 03/19/2021 FINDINGS: CT HEAD FINDINGS Brain: No intracranial hemorrhage, mass effect, or evidence of acute infarct. No hydrocephalus. No extra-axial fluid collection. Mild cerebral atrophy. Vascular: No hyperdense vessel. Intracranial arterial calcification. Skull: No fracture or  focal lesion. Sinuses/Orbits: No acute finding. Other: None. CT CERVICAL SPINE FINDINGS Alignment: No evidence of traumatic malalignment. Skull base and vertebrae: No acute fracture. No primary bone lesion or focal pathologic process. Soft tissues and spinal canal: No prevertebral fluid or swelling. No visible canal hematoma. Disc levels: Intervertebral disc space height is maintained. No severe spinal canal narrowing. Upper chest: Emphysema. Other: None. IMPRESSION: 1. No acute intracranial abnormality. 2. No acute fracture in the cervical spine. Electronically Signed   By: Rozell Cornet M.D.   On: 09/17/2023 04:00    Procedures Procedures    Medications Ordered in ED Medications - No data to display  ED Course/ Medical Decision Making/ A&P                                 Medical Decision Making Amount and/or Complexity of Data Reviewed Labs: ordered. Radiology: ordered and independent interpretation performed. ECG/medicine tests: ordered and independent interpretation performed.   58 year old male presenting to the ED from local [employer] due to alcohol  intoxication.  He apparently fell out of his wheelchair in the parking lot and was sent here for further evaluation.  Apparently did strike head, he denies LOC but not entirely clear.   He is intoxicated on appearing, does arouse and answer questions/follow commands.  He is tachycardic here but no fever or hypotension.  Does have wound to right stump-- dressing taken down and this overall appears quite good today.  No significant drainage.  No tissue crepitus.  Abrasions to forehead-- minor in appearance.  CT head/neck negative.  Labs obtained from triage--no leukocytosis.  Hemoglobin is stable compared with prior.  Does have some minor electrolyte abnormalities, very similar to prior.  Ethanol 269.  Negative Tylenol  and salicylate levels.  His UDS is pending.  I suspect he likely has some other illicit substances on board contributing to  his tachycardia.  Has tested positive for THC in the past.  Less likely sepsis given absence of fever, leukocytosis, and improved appearance of his wound..  Given IVF here.  Will observe.    5:39 AM HR has continued to downtrend while here.  Around 120 now.  Will need to metabolize and ensure back to baseline.  Anticipate discharge once clinically sober if HR improved.  Final Clinical Impression(s) / ED Diagnoses Final diagnoses:  Alcoholic intoxication without complication [facility])    Rx / DC Orders ED Discharge Orders     None         Coretha Dew, PA-C 09/17/23 1610    Iva Mariner, MD 09/17/23 [phone]

## 2023-09-17 NOTE — ED Provider Notes (Signed)
  Physical Exam  BP 122/70   Pulse (!) 107   Temp 98.6 F (37 C) (Oral)   Resp 20   SpO2 98%   Physical Exam Constitutional:      Appearance: He is normal weight.  Cardiovascular:     Rate and Rhythm: Regular rhythm. Tachycardia present.  Pulmonary:     Effort: Pulmonary effort is normal.     Breath sounds: Normal breath sounds.  Abdominal:     General: Abdomen is flat. Bowel sounds are normal.     Palpations: Abdomen is soft.  Neurological:     Mental Status: He is alert.     Procedures  Procedures  ED Course / MDM    Medical Decision Making Amount and/or Complexity of Data Reviewed Labs: ordered. Radiology: ordered.  Risk Prescription drug management.   Patient was signed off from prior ED provider Edwina Gram PA-c presenting with EtOH intoxication. Labs at baseline, no sign of infection, and plan to discharge after observation until clinically sober.   Patient is clinically sober.  He is tolerating oral intake.  His symptoms have improved and he is feeling well.  He has had sinus tachycardia throughout stay however this has improved with normal saline and after Ativan .  I feel this is likely secondary to some mild dehydration as well as intoxication/drug use, but tachycardia has improved appropriately.  He denies chest pain or shortness of breath.  Feel he is stable for discharge with outpatient follow-up.        Adina Ahle 09/17/23 1237    Lind Repine, MD 09/19/23 1255

## 2023-09-20 ENCOUNTER — Encounter (HOSPITAL_COMMUNITY): Payer: Self-pay

## 2023-09-20 ENCOUNTER — Inpatient Hospital Stay (HOSPITAL_COMMUNITY)
Admission: EM | Admit: 2023-09-20 | Discharge: 2023-09-29 | DRG: 564 | Disposition: A | Discharge status: Home / Self Care | Payer: MEDICAID | Attending: Internal Medicine | Admitting: Internal Medicine

## 2023-09-20 ENCOUNTER — Emergency Department (HOSPITAL_COMMUNITY): Payer: MEDICAID

## 2023-09-20 DIAGNOSIS — L03115 Cellulitis of right lower limb: Secondary | ICD-10-CM | POA: Diagnosis present

## 2023-09-20 DIAGNOSIS — Y835 Amputation of limb(s) as the cause of abnormal reaction of the patient, or of later complication, without mention of misadventure at the time of the procedure: Secondary | ICD-10-CM | POA: Diagnosis present

## 2023-09-20 DIAGNOSIS — F10229 Alcohol dependence with intoxication, unspecified: Secondary | ICD-10-CM | POA: Diagnosis present

## 2023-09-20 DIAGNOSIS — T148XXA Other injury of unspecified body region, initial encounter: Principal | ICD-10-CM

## 2023-09-20 DIAGNOSIS — T8781 Dehiscence of amputation stump: Secondary | ICD-10-CM | POA: Diagnosis present

## 2023-09-20 DIAGNOSIS — L89321 Pressure ulcer of left buttock, stage 1: Secondary | ICD-10-CM | POA: Diagnosis present

## 2023-09-20 DIAGNOSIS — Y908 Blood alcohol level of 240 mg/100 ml or more: Secondary | ICD-10-CM | POA: Diagnosis present

## 2023-09-20 DIAGNOSIS — L89311 Pressure ulcer of right buttock, stage 1: Secondary | ICD-10-CM | POA: Diagnosis present

## 2023-09-20 DIAGNOSIS — F10239 Alcohol dependence with withdrawal, unspecified: Secondary | ICD-10-CM | POA: Diagnosis present

## 2023-09-20 DIAGNOSIS — Z89612 Acquired absence of left leg above knee: Secondary | ICD-10-CM

## 2023-09-20 DIAGNOSIS — F1020 Alcohol dependence, uncomplicated: Secondary | ICD-10-CM | POA: Diagnosis present

## 2023-09-20 DIAGNOSIS — Z79899 Other long term (current) drug therapy: Secondary | ICD-10-CM

## 2023-09-20 DIAGNOSIS — F1910 Other psychoactive substance abuse, uncomplicated: Secondary | ICD-10-CM | POA: Diagnosis present

## 2023-09-20 DIAGNOSIS — Z6821 Body mass index (BMI) 21.0-21.9, adult: Secondary | ICD-10-CM

## 2023-09-20 DIAGNOSIS — L089 Local infection of the skin and subcutaneous tissue, unspecified: Secondary | ICD-10-CM | POA: Diagnosis present

## 2023-09-20 DIAGNOSIS — E876 Hypokalemia: Secondary | ICD-10-CM | POA: Diagnosis present

## 2023-09-20 DIAGNOSIS — Z5900 Homelessness unspecified: Secondary | ICD-10-CM

## 2023-09-20 DIAGNOSIS — A419 Sepsis, unspecified organism: Secondary | ICD-10-CM | POA: Diagnosis present

## 2023-09-20 DIAGNOSIS — F17210 Nicotine dependence, cigarettes, uncomplicated: Secondary | ICD-10-CM | POA: Diagnosis present

## 2023-09-20 DIAGNOSIS — F419 Anxiety disorder, unspecified: Secondary | ICD-10-CM | POA: Diagnosis present

## 2023-09-20 DIAGNOSIS — Z720 Tobacco use: Secondary | ICD-10-CM | POA: Diagnosis present

## 2023-09-20 DIAGNOSIS — F32A Depression, unspecified: Secondary | ICD-10-CM | POA: Diagnosis present

## 2023-09-20 DIAGNOSIS — L89301 Pressure ulcer of unspecified buttock, stage 1: Secondary | ICD-10-CM | POA: Diagnosis present

## 2023-09-20 DIAGNOSIS — E440 Moderate protein-calorie malnutrition: Secondary | ICD-10-CM | POA: Diagnosis present

## 2023-09-20 DIAGNOSIS — T8743 Infection of amputation stump, right lower extremity: Principal | ICD-10-CM | POA: Diagnosis present

## 2023-09-20 DIAGNOSIS — E872 Acidosis, unspecified: Secondary | ICD-10-CM | POA: Diagnosis present

## 2023-09-20 LAB — BASIC METABOLIC PANEL WITH GFR
Anion gap: 9 (ref 5–15)
BUN: <5 mg/dL — ABNORMAL LOW (ref 6–20)
CO2: 27 mmol/L (ref 22–32)
Calcium: 7.7 mg/dL — ABNORMAL LOW (ref 8.9–10.3)
Chloride: 102 mmol/L (ref 98–111)
Creatinine, Ser: 0.65 mg/dL (ref 0.61–1.24)
GFR, Estimated: >60 mL/min (ref >60)
Glucose, Bld: 110 mg/dL — ABNORMAL HIGH (ref 70–99)
Potassium: 3.6 mmol/L (ref 3.5–5.1)
Sodium: 138 mmol/L (ref 135–145)

## 2023-09-20 LAB — ETHANOL: Alcohol, Ethyl (B): 254 mg/dL — ABNORMAL HIGH (ref <15)

## 2023-09-20 LAB — RAPID URINE DRUG SCREEN, HOSP PERFORMED
Amphetamines: NOT DETECTED
Barbiturates: NOT DETECTED
Benzodiazepines: POSITIVE — AB
Cocaine: NOT DETECTED
Opiates: NOT DETECTED
Tetrahydrocannabinol: NOT DETECTED

## 2023-09-20 LAB — I-STAT CG4 LACTIC ACID, ED: Lactic Acid, Venous: 2.2 mmol/L (ref 0.5–1.9)

## 2023-09-20 MED ORDER — LACTATED RINGERS IV BOLUS
1000.0000 mL | Freq: Once | INTRAVENOUS | Status: AC
  Administered 2023-09-20: 1000 mL via INTRAVENOUS

## 2023-09-20 NOTE — ED Triage Notes (Signed)
 Pt is coming in with medic for a reported right leg infection, he is a bilateral amputee. The right leg stop look more like a pressure wound, He also is complaining of bed sores as well.  122hr 120/84 95%ra 125bgl 18rr

## 2023-09-20 NOTE — ED Provider Notes (Signed)
 [place] EMERGENCY DEPARTMENT AT [facility] Provider Note   CSN: [id number] Arrival date & time: 09/20/23  2019     History  Chief Complaint  Patient presents with   Wound Infection    Jake Nguyen is a 58 y.o. male with a past medical history of polysubstance abuse, bilateral BKA presents to emergency department via EMS for evaluation of right leg infection and pressure sores to bottom. Finished augmentin  and doxy last week for RLE infection  HPI     Home Medications Prior to Admission medications   Medication Sig Start Date End Date Taking? Authorizing Provider  acetaminophen  (TYLENOL ) 325 MG tablet Take 1-2 tablets (325-650 mg total) by mouth every 6 (six) hours as needed for mild pain (pain score 1-3) (or temp > 100.5). Patient not taking: Reported on 09/04/2023 08/24/23   Elgergawy, Ardia Kraft, MD  amoxicillin -clavulanate (AUGMENTIN ) 875-125 MG tablet Take 1 tablet by mouth every 12 (twelve) hours. 09/08/23   Danford, Willis Harter, MD  atorvastatin  (LIPITOR) 40 MG tablet Take 1 tablet (40 mg total) by mouth daily. Patient not taking: Reported on 09/04/2023 08/24/23   Elgergawy, Ardia Kraft, MD  doxycycline  (VIBRA -TABS) 100 MG tablet Take 1 tablet (100 mg total) by mouth every 12 (twelve) hours. 09/08/23   Danford, Willis Harter, MD  oxyCODONE  (OXY IR/ROXICODONE ) 5 MG immediate release tablet Take 1-2 tablets (5-10 mg total) by mouth every 6 (six) hours as needed for severe pain (pain score 7-10). Patient not taking: Reported on 09/04/2023 08/24/23   Elgergawy, Ardia Kraft, MD  thiamine  (VITAMIN B-1) 100 MG tablet Take 1 tablet (100 mg total) by mouth daily. Patient not taking: Reported on 09/04/2023 08/25/23   Elgergawy, Ardia Kraft, MD  triamcinolone  cream (KENALOG ) 0.1 % Apply to the affected area(s) of the skin three times daily as directed 09/09/23   Danford, Willis Harter, MD      Allergies    Pork-derived products    Review of Systems   Review of Systems   Constitutional:  Negative for chills, fatigue and fever.  Respiratory:  Negative for cough, chest tightness, shortness of breath and wheezing.   Cardiovascular:  Negative for chest pain and palpitations.  Gastrointestinal:  Negative for abdominal pain, constipation, diarrhea, nausea and vomiting.  Neurological:  Negative for dizziness, seizures, weakness, light-headedness, numbness and headaches.    Physical Exam Updated Vital Signs BP 100/71 (BP Location: Left Arm)   Pulse (!) 119   Temp 98.2 F (36.8 C) (Oral)   Resp 16   SpO2 94%  Physical Exam Vitals and nursing note reviewed.  Constitutional:      General: He is not in acute distress.    Appearance: Normal appearance.     Comments: Fatigued but easily rousable with stimuli  HENT:     Head: Normocephalic and atraumatic.  Eyes:     Conjunctiva/sclera: Conjunctivae normal.  Cardiovascular:     Rate and Rhythm: Tachycardia present.  Pulmonary:     Effort: Pulmonary effort is normal. No respiratory distress.     Breath sounds: Normal breath sounds.  Chest:     Chest wall: No tenderness.  Abdominal:     General: Bowel sounds are normal. There is no distension.     Palpations: Abdomen is soft.     Tenderness: There is no abdominal tenderness. There is no guarding.  Musculoskeletal:     Right lower leg: No edema.     Left lower leg: No edema.  Skin:  General: Skin is warm.     Capillary Refill: Capillary refill takes less than 2 seconds.     Coloration: Skin is not jaundiced or pale.     Comments: Right leg wound with purulent discharge, warmth, odor. Warm to touch. See media Also has two stage one pressure ulcers of both buttocks. Noninfectious appearing  Neurological:     Mental Status: He is alert and oriented to person, place, and time. Mental status is at baseline.     ED Results / Procedures / Treatments   Labs (all labs ordered are listed, but only abnormal results are displayed) Labs Reviewed  BASIC  METABOLIC PANEL WITH GFR - Abnormal; Notable for the following components:      Result Value   Glucose, Bld 110 (*)    BUN <5 (*)    Calcium  7.7 (*)    All other components within normal limits  ETHANOL - Abnormal; Notable for the following components:   Alcohol , Ethyl (B) 254 (*)    All other components within normal limits  RAPID URINE DRUG SCREEN, HOSP PERFORMED - Abnormal; Notable for the following components:   Benzodiazepines POSITIVE (*)    All other components within normal limits  I-STAT CG4 LACTIC ACID, ED - Abnormal; Notable for the following components:   Lactic Acid, Venous 2.2 (*)    All other components within normal limits  CBC WITH DIFFERENTIAL/PLATELET  CBC WITH DIFFERENTIAL/PLATELET    EKG None  Radiology DG Femur Min 2 Views Right Result Date: 09/20/2023 CLINICAL DATA:  Right leg infection pressure wound EXAM: RIGHT FEMUR 2 VIEWS COMPARISON:  09/03/2023 FINDINGS: Status post below the knee amputation. Smooth cut margins. No osseous destructive change. No soft tissue gas IMPRESSION: Status post below the knee amputation. No acute osseous abnormality Electronically Signed   By: Esmeralda Hedge M.D.   On: 09/20/2023 23:22    Procedures Procedures    Medications Ordered in ED Medications  lactated ringers  bolus 1,000 mL (1,000 mLs Intravenous New Bag/Given 09/20/23 2341)    ED Course/ Medical Decision Making/ A&P                                 Medical Decision Making Amount and/or Complexity of Data Reviewed Labs: ordered. Radiology: ordered.  Risk Prescription drug management.   Patient presents to the ED for concern of left leg wound, this involves an extensive number of treatment options, and is a complaint that carries with it a high risk of complications and morbidity.  The differential diagnosis includes cellulitis, osteomyelitis, chronic wound   Co morbidities that complicate the patient evaluation  See HPI   Additional history  obtained:  Additional history obtained from Nursing and Outside Medical Records   External records from outside source obtained and reviewed including triage RN note. Recent ED note from 09/16/23   Lab Tests:  I Ordered, and personally interpreted labs.  The pertinent results include:   Initial lactic 2.2 Ethanol 254 CBG 110   Imaging Studies ordered:  I ordered imaging studies including right femur XR  I independently visualized and interpreted imaging which showed no osteomyelitis I agree with the radiologist interpretation     Medicines ordered and prescription drug management:  I ordered medication including LR  for mildly elevated lactic  Reevaluation of the patient after these medicines showed that the patient improved I have reviewed the patients home medicines and have made adjustments as needed  Problem List / ED Course:  Chronic wound Stage I pressure ulcer of bilateral buttocks No fever nor hypotension. Mildly elevated lactic Has a history of tachycardia from previous ED visits.  Also has a history of polysubstance abuse which likely contributes to tachycardia Wound appears worse that picture from earlier this month. XR neg for osteo however with warmth, discharge, and odor I think patient would benefit from admission for IV antibiotics as he recently finished PO abx for infection   Reevaluation:  After the interventions noted above, I reevaluated the patient and found that they have :stayed the same   Social Determinants of Health:  Homeless.  alcoholic   Dispostion:  Dispo pending completion of labs, reassessment, metabolism to freedom.  Fortunately, x-rays negative for osteomyelitis and wound appears newly infected.  Think patient would benefit from IV antibiotics and admission   Final Clinical Impression(s) / ED Diagnoses Final diagnoses:  Chronic wound  Pressure injury of buttock, stage 1, unspecified laterality    Rx / DC Orders ED  Discharge Orders     None         Royann Cords, PA 09/21/23 0007    Hershel Los, MD 09/21/23 1517

## 2023-09-20 NOTE — ED Notes (Signed)
 Patient came in on ambulance and needed to be cleaned. I cleaned him and put on dry clothes.

## 2023-09-21 ENCOUNTER — Other Ambulatory Visit: Payer: Self-pay

## 2023-09-21 DIAGNOSIS — F1020 Alcohol dependence, uncomplicated: Secondary | ICD-10-CM

## 2023-09-21 DIAGNOSIS — Y835 Amputation of limb(s) as the cause of abnormal reaction of the patient, or of later complication, without mention of misadventure at the time of the procedure: Secondary | ICD-10-CM | POA: Diagnosis present

## 2023-09-21 DIAGNOSIS — E876 Hypokalemia: Secondary | ICD-10-CM | POA: Diagnosis present

## 2023-09-21 DIAGNOSIS — L89311 Pressure ulcer of right buttock, stage 1: Secondary | ICD-10-CM | POA: Diagnosis present

## 2023-09-21 DIAGNOSIS — T8781 Dehiscence of amputation stump: Secondary | ICD-10-CM | POA: Diagnosis present

## 2023-09-21 DIAGNOSIS — F32A Depression, unspecified: Secondary | ICD-10-CM | POA: Diagnosis present

## 2023-09-21 DIAGNOSIS — F1910 Other psychoactive substance abuse, uncomplicated: Secondary | ICD-10-CM | POA: Diagnosis present

## 2023-09-21 DIAGNOSIS — F10239 Alcohol dependence with withdrawal, unspecified: Secondary | ICD-10-CM | POA: Diagnosis present

## 2023-09-21 DIAGNOSIS — Z89612 Acquired absence of left leg above knee: Secondary | ICD-10-CM | POA: Diagnosis not present

## 2023-09-21 DIAGNOSIS — Z6821 Body mass index (BMI) 21.0-21.9, adult: Secondary | ICD-10-CM | POA: Diagnosis not present

## 2023-09-21 DIAGNOSIS — A419 Sepsis, unspecified organism: Secondary | ICD-10-CM

## 2023-09-21 DIAGNOSIS — F10229 Alcohol dependence with intoxication, unspecified: Secondary | ICD-10-CM | POA: Diagnosis present

## 2023-09-21 DIAGNOSIS — F419 Anxiety disorder, unspecified: Secondary | ICD-10-CM | POA: Diagnosis present

## 2023-09-21 DIAGNOSIS — F17210 Nicotine dependence, cigarettes, uncomplicated: Secondary | ICD-10-CM | POA: Diagnosis present

## 2023-09-21 DIAGNOSIS — T8743 Infection of amputation stump, right lower extremity: Secondary | ICD-10-CM | POA: Diagnosis present

## 2023-09-21 DIAGNOSIS — Z5900 Homelessness unspecified: Secondary | ICD-10-CM | POA: Diagnosis not present

## 2023-09-21 DIAGNOSIS — L03115 Cellulitis of right lower limb: Secondary | ICD-10-CM | POA: Diagnosis present

## 2023-09-21 DIAGNOSIS — Z79899 Other long term (current) drug therapy: Secondary | ICD-10-CM | POA: Diagnosis not present

## 2023-09-21 DIAGNOSIS — Y908 Blood alcohol level of 240 mg/100 ml or more: Secondary | ICD-10-CM | POA: Diagnosis present

## 2023-09-21 DIAGNOSIS — E440 Moderate protein-calorie malnutrition: Secondary | ICD-10-CM | POA: Diagnosis present

## 2023-09-21 DIAGNOSIS — L89321 Pressure ulcer of left buttock, stage 1: Secondary | ICD-10-CM | POA: Diagnosis present

## 2023-09-21 DIAGNOSIS — L89301 Pressure ulcer of unspecified buttock, stage 1: Secondary | ICD-10-CM | POA: Diagnosis present

## 2023-09-21 DIAGNOSIS — E872 Acidosis, unspecified: Secondary | ICD-10-CM | POA: Diagnosis present

## 2023-09-21 LAB — I-STAT CG4 LACTIC ACID, ED
Lactic Acid, Venous: 2.4 mmol/L (ref 0.5–1.9)
Lactic Acid, Venous: 1.9 mmol/L (ref 0.5–1.9)

## 2023-09-21 LAB — CBC WITH DIFFERENTIAL/PLATELET
Abs Immature Granulocytes: 0.02 10*3/uL (ref 0.00–0.07)
Abs Immature Granulocytes: 0.03 10*3/uL (ref 0.00–0.07)
Basophils Absolute: 0.1 10*3/uL (ref 0.0–0.1)
Basophils Absolute: 0.1 10*3/uL (ref 0.0–0.1)
Basophils Relative: 1 %
Basophils Relative: 1 %
Eosinophils Absolute: 0.5 10*3/uL (ref 0.0–0.5)
Eosinophils Absolute: 0.5 10*3/uL (ref 0.0–0.5)
Eosinophils Relative: 6 %
Eosinophils Relative: 7 %
HCT: 30.9 % — ABNORMAL LOW (ref 39.0–52.0)
HCT: 34 % — ABNORMAL LOW (ref 39.0–52.0)
Hemoglobin: 10.1 g/dL — ABNORMAL LOW (ref 13.0–17.0)
Hemoglobin: 11.3 g/dL — ABNORMAL LOW (ref 13.0–17.0)
Immature Granulocytes: 0 %
Immature Granulocytes: 0 %
Lymphocytes Relative: 34 %
Lymphocytes Relative: 42 %
Lymphs Abs: 2.6 10*3/uL (ref 0.7–4.0)
Lymphs Abs: 3.3 10*3/uL (ref 0.7–4.0)
MCH: 28.6 pg (ref 26.0–34.0)
MCH: 28.8 pg (ref 26.0–34.0)
MCHC: 32.7 g/dL (ref 30.0–36.0)
MCHC: 33.2 g/dL (ref 30.0–36.0)
MCV: 86.7 fL (ref 80.0–100.0)
MCV: 87.5 fL (ref 80.0–100.0)
Monocytes Absolute: 0.8 10*3/uL (ref 0.1–1.0)
Monocytes Absolute: 1 10*3/uL (ref 0.1–1.0)
Monocytes Relative: 11 %
Monocytes Relative: 13 %
Neutro Abs: 3 10*3/uL (ref 1.7–7.7)
Neutro Abs: 3.5 10*3/uL (ref 1.7–7.7)
Neutrophils Relative %: 39 %
Neutrophils Relative %: 46 %
Platelets: 283 10*3/uL (ref 150–400)
Platelets: 291 10*3/uL (ref 150–400)
RBC: 3.53 MIL/uL — ABNORMAL LOW (ref 4.22–5.81)
RBC: 3.92 MIL/uL — ABNORMAL LOW (ref 4.22–5.81)
RDW: 13.5 % (ref 11.5–15.5)
RDW: 13.5 % (ref 11.5–15.5)
WBC: 7.7 10*3/uL (ref 4.0–10.5)
WBC: 7.8 10*3/uL (ref 4.0–10.5)
nRBC: 0 % (ref 0.0–0.2)
nRBC: 0 % (ref 0.0–0.2)

## 2023-09-21 LAB — COMPREHENSIVE METABOLIC PANEL WITH GFR
ALT: 12 U/L (ref 0–44)
AST: 20 U/L (ref 15–41)
Albumin: 1.8 g/dL — ABNORMAL LOW (ref 3.5–5.0)
Alkaline Phosphatase: 57 U/L (ref 38–126)
Anion gap: 11 (ref 5–15)
BUN: <5 mg/dL — ABNORMAL LOW (ref 6–20)
CO2: 26 mmol/L (ref 22–32)
Calcium: 7.2 mg/dL — ABNORMAL LOW (ref 8.9–10.3)
Chloride: 100 mmol/L (ref 98–111)
Creatinine, Ser: 0.51 mg/dL — ABNORMAL LOW (ref 0.61–1.24)
GFR, Estimated: >60 mL/min (ref >60)
Glucose, Bld: 106 mg/dL — ABNORMAL HIGH (ref 70–99)
Potassium: 2.8 mmol/L — ABNORMAL LOW (ref 3.5–5.1)
Sodium: 137 mmol/L (ref 135–145)
Total Bilirubin: 0.2 mg/dL (ref 0.0–1.2)
Total Protein: 6.3 g/dL — ABNORMAL LOW (ref 6.5–8.1)

## 2023-09-21 LAB — BASIC METABOLIC PANEL WITH GFR
Anion gap: 9 (ref 5–15)
BUN: <5 mg/dL — ABNORMAL LOW (ref 6–20)
CO2: 30 mmol/L (ref 22–32)
Calcium: 7.3 mg/dL — ABNORMAL LOW (ref 8.9–10.3)
Chloride: 95 mmol/L — ABNORMAL LOW (ref 98–111)
Creatinine, Ser: 0.59 mg/dL — ABNORMAL LOW (ref 0.61–1.24)
GFR, Estimated: >60 mL/min (ref >60)
Glucose, Bld: 93 mg/dL (ref 70–99)
Potassium: 3.2 mmol/L — ABNORMAL LOW (ref 3.5–5.1)
Sodium: 134 mmol/L — ABNORMAL LOW (ref 135–145)

## 2023-09-21 LAB — MAGNESIUM
Magnesium: 1.2 mg/dL — ABNORMAL LOW (ref 1.7–2.4)
Magnesium: 2 mg/dL (ref 1.7–2.4)

## 2023-09-21 LAB — C-REACTIVE PROTEIN: CRP: 2.1 mg/dL — ABNORMAL HIGH (ref <1.0)

## 2023-09-21 LAB — SEDIMENTATION RATE: Sed Rate: 30 mm/h — ABNORMAL HIGH (ref 0–16)

## 2023-09-21 LAB — PHOSPHORUS: Phosphorus: 2.6 mg/dL (ref 2.5–4.6)

## 2023-09-21 MED ORDER — THIAMINE MONONITRATE 100 MG PO TABS
100.0000 mg | ORAL_TABLET | Freq: Every day | ORAL | Status: DC
  Administered 2023-09-21: 100 mg via ORAL
  Filled 2023-09-21: qty 1

## 2023-09-21 MED ORDER — SODIUM CHLORIDE 0.9 % IV SOLN
1.0000 g | INTRAVENOUS | Status: DC
  Administered 2023-09-21: 1 g via INTRAVENOUS
  Filled 2023-09-21: qty 10

## 2023-09-21 MED ORDER — THIAMINE HCL 100 MG/ML IJ SOLN
100.0000 mg | Freq: Once | INTRAMUSCULAR | Status: AC
  Administered 2023-09-21: 100 mg via INTRAVENOUS
  Filled 2023-09-21: qty 2

## 2023-09-21 MED ORDER — CHLORDIAZEPOXIDE HCL 5 MG PO CAPS
25.0000 mg | ORAL_CAPSULE | Freq: Four times a day (QID) | ORAL | Status: AC
  Administered 2023-09-21 (×4): 25 mg via ORAL
  Filled 2023-09-21: qty 1
  Filled 2023-09-21 (×3): qty 5

## 2023-09-21 MED ORDER — VANCOMYCIN HCL IN DEXTROSE 1-5 GM/200ML-% IV SOLN
1000.0000 mg | Freq: Two times a day (BID) | INTRAVENOUS | Status: DC
  Administered 2023-09-21 – 2023-09-22 (×3): 1000 mg via INTRAVENOUS
  Filled 2023-09-21 (×3): qty 200

## 2023-09-21 MED ORDER — POTASSIUM CHLORIDE 10 MEQ/100ML IV SOLN: 10.0000 meq | INTRAVENOUS | Status: DC

## 2023-09-21 MED ORDER — THIAMINE MONONITRATE 100 MG PO TABS
100.0000 mg | ORAL_TABLET | Freq: Every day | ORAL | Status: DC
  Administered 2023-09-24 – 2023-09-29 (×6): 100 mg via ORAL
  Filled 2023-09-21 (×6): qty 1

## 2023-09-21 MED ORDER — CHLORDIAZEPOXIDE HCL 5 MG PO CAPS
25.0000 mg | ORAL_CAPSULE | Freq: Three times a day (TID) | ORAL | Status: AC
  Administered 2023-09-22 (×3): 25 mg via ORAL
  Filled 2023-09-21 (×3): qty 5

## 2023-09-21 MED ORDER — POTASSIUM CHLORIDE 10 MEQ/100ML IV SOLN
10.0000 meq | INTRAVENOUS | Status: AC
  Administered 2023-09-21 (×3): 10 meq via INTRAVENOUS
  Filled 2023-09-21 (×3): qty 100

## 2023-09-21 MED ORDER — MAGNESIUM SULFATE 4 GM/100ML IV SOLN
4.0000 g | Freq: Once | INTRAVENOUS | Status: AC
  Administered 2023-09-21: 4 g via INTRAVENOUS
  Filled 2023-09-21: qty 100

## 2023-09-21 MED ORDER — NICOTINE POLACRILEX 2 MG MT GUM: 2.0000 mg | CHEWING_GUM | OROMUCOSAL | Status: DC | PRN

## 2023-09-21 MED ORDER — LOPERAMIDE HCL 2 MG PO CAPS: 2.0000 mg | ORAL_CAPSULE | ORAL | Status: AC | PRN

## 2023-09-21 MED ORDER — VANCOMYCIN HCL IN DEXTROSE 1-5 GM/200ML-% IV SOLN
1000.0000 mg | Freq: Once | INTRAVENOUS | Status: AC
Start: 2023-09-21 — End: 2023-09-21
  Administered 2023-09-21: 1000 mg via INTRAVENOUS
  Filled 2023-09-21: qty 200

## 2023-09-21 MED ORDER — ENOXAPARIN SODIUM 40 MG/0.4ML IJ SOSY
40.0000 mg | PREFILLED_SYRINGE | INTRAMUSCULAR | Status: DC
  Administered 2023-09-21 – 2023-09-29 (×9): 40 mg via SUBCUTANEOUS
  Filled 2023-09-21 (×9): qty 0.4

## 2023-09-21 MED ORDER — CHLORDIAZEPOXIDE HCL 5 MG PO CAPS
25.0000 mg | ORAL_CAPSULE | Freq: Every day | ORAL | Status: AC
  Administered 2023-09-24: 25 mg via ORAL
  Filled 2023-09-21: qty 5

## 2023-09-21 MED ORDER — POTASSIUM CHLORIDE CRYS ER 20 MEQ PO TBCR
40.0000 meq | EXTENDED_RELEASE_TABLET | Freq: Four times a day (QID) | ORAL | Status: AC
  Administered 2023-09-21 (×2): 40 meq via ORAL
  Filled 2023-09-21 (×2): qty 2

## 2023-09-21 MED ORDER — ACETAMINOPHEN 650 MG RE SUPP: 650.0000 mg | Freq: Four times a day (QID) | RECTAL | Status: DC | PRN

## 2023-09-21 MED ORDER — LORAZEPAM 1 MG PO TABS: 1.0000 mg | ORAL_TABLET | ORAL | Status: AC | PRN

## 2023-09-21 MED ORDER — THIAMINE HCL 100 MG/ML IJ SOLN
500.0000 mg | INTRAVENOUS | Status: AC
  Administered 2023-09-21 – 2023-09-23 (×3): 500 mg via INTRAVENOUS
  Filled 2023-09-21 (×4): qty 5

## 2023-09-21 MED ORDER — NALOXONE HCL 0.4 MG/ML IJ SOLN: 0.4000 mg | INTRAMUSCULAR | Status: DC | PRN

## 2023-09-21 MED ORDER — MELATONIN 3 MG PO TABS
3.0000 mg | ORAL_TABLET | Freq: Every evening | ORAL | Status: DC | PRN
  Administered 2023-09-22 – 2023-09-28 (×2): 3 mg via ORAL
  Filled 2023-09-21 (×2): qty 1

## 2023-09-21 MED ORDER — ONDANSETRON HCL 4 MG/2ML IJ SOLN: 4.0000 mg | Freq: Four times a day (QID) | INTRAMUSCULAR | Status: DC | PRN

## 2023-09-21 MED ORDER — THIAMINE HCL 100 MG/ML IJ SOLN
100.0000 mg | Freq: Every day | INTRAMUSCULAR | Status: DC
  Filled 2023-09-21: qty 2

## 2023-09-21 MED ORDER — POLYETHYLENE GLYCOL 3350 17 G PO PACK: 17.0000 g | PACK | Freq: Every day | ORAL | Status: DC | PRN

## 2023-09-21 MED ORDER — CHLORDIAZEPOXIDE HCL 5 MG PO CAPS
25.0000 mg | ORAL_CAPSULE | ORAL | Status: AC
Start: 2023-09-23 — End: 2023-09-24
  Administered 2023-09-23 (×2): 25 mg via ORAL
  Filled 2023-09-21 (×2): qty 5

## 2023-09-21 MED ORDER — NICOTINE 14 MG/24HR TD PT24
14.0000 mg | MEDICATED_PATCH | Freq: Every day | TRANSDERMAL | Status: DC
  Filled 2023-09-21 (×8): qty 1

## 2023-09-21 MED ORDER — LACTATED RINGERS IV SOLN
INTRAVENOUS | Status: AC
Start: 2023-09-21 — End: 2023-09-21

## 2023-09-21 MED ORDER — ADULT MULTIVITAMIN W/MINERALS CH
1.0000 | ORAL_TABLET | Freq: Every day | ORAL | Status: DC
  Administered 2023-09-21 – 2023-09-29 (×9): 1 via ORAL
  Filled 2023-09-21 (×9): qty 1

## 2023-09-21 MED ORDER — SODIUM CHLORIDE 0.9 % IV SOLN
2.0000 g | Freq: Once | INTRAVENOUS | Status: AC
  Administered 2023-09-21: 2 g via INTRAVENOUS
  Filled 2023-09-21: qty 20

## 2023-09-21 MED ORDER — PANTOPRAZOLE SODIUM 40 MG PO TBEC
40.0000 mg | DELAYED_RELEASE_TABLET | Freq: Every day | ORAL | Status: DC
  Administered 2023-09-21 – 2023-09-29 (×9): 40 mg via ORAL
  Filled 2023-09-21 (×9): qty 1

## 2023-09-21 MED ORDER — FOLIC ACID 1 MG PO TABS
1.0000 mg | ORAL_TABLET | Freq: Every day | ORAL | Status: DC
  Administered 2023-09-21 – 2023-09-29 (×9): 1 mg via ORAL
  Filled 2023-09-21 (×5): qty 1

## 2023-09-21 MED ORDER — LORAZEPAM 2 MG/ML IJ SOLN: 1.0000 mg | INTRAMUSCULAR | Status: AC | PRN

## 2023-09-21 MED ORDER — FENTANYL CITRATE PF 50 MCG/ML IJ SOSY
25.0000 ug | PREFILLED_SYRINGE | INTRAMUSCULAR | Status: DC | PRN
  Filled 2023-09-21: qty 1

## 2023-09-21 MED ORDER — ACETAMINOPHEN 325 MG PO TABS
650.0000 mg | ORAL_TABLET | Freq: Four times a day (QID) | ORAL | Status: DC | PRN
  Administered 2023-09-27: 650 mg via ORAL
  Filled 2023-09-21: qty 2

## 2023-09-21 NOTE — Progress Notes (Signed)
 Elink monitoring for the code sepsis protocol.

## 2023-09-21 NOTE — Plan of Care (Signed)
  Problem: Education: Goal: Knowledge of General Education information will improve Description: Including pain rating scale, medication(s)/side effects and non-pharmacologic comfort measures Outcome: Progressing   Problem: Health Behavior/Discharge Planning: Goal: Ability to manage health-related needs will improve Outcome: Progressing   Problem: Activity: Goal: Risk for activity intolerance will decrease Outcome: Progressing   Problem: Nutrition: Goal: Adequate nutrition will be maintained Outcome: Progressing   Problem: Safety: Goal: Ability to remain free from injury will improve Outcome: Progressing   Problem: Skin Integrity: Goal: Skin integrity will improve Outcome: Progressing

## 2023-09-21 NOTE — Progress Notes (Signed)
 CSW added substance abuse resources to patient's AVS.  Edwin Dada, MSW, LCSW Transitions of Care  Clinical Social Worker II [phone]

## 2023-09-21 NOTE — Progress Notes (Signed)
 Carryover admission to the Day Admitter.  I discussed this case with the EDP, Abelardo Hoehn, PA.  Per these discussions:     This is a 58 y.o. male with history of b/l BKA's, most recently having undergone right-sided BKA with Dr. Julio Ohm on 08/20/23, who is being admitted with suspected right lower extremity cellulitis after presenting with c/o right BKA stump pain associated with erythema and some purulent drainage. Upon presentation to the ED this evening, he was also noted to be mildly intoxicated, with serum ETOH level noted to be 254.   VS in the ED this evening were notable for afebrile, and mild tachycardia, with HR's in the 1-teens.   Presenting labs notable for wbc of 7,800. LA 2.2, with repeat LA currently pending.   Plain films of of right bka stump were reported to show no evidence of osteomyelitis, but rather showed some soft tissue edema consistent with cellulitis in the absence of any e/o subcutaneous gas.   Blood cx was collected, and the patient received single dose of IV Vanc and Rocephin  for suspected cellulitis of the RLE. Additionally, he received a one liter NS bolus.   Subsequently, I have placed an order for observation for further evaluation/management of the above.    I have placed some additional preliminary admit orders via the adult multi-morbid admission order set. I have also ordered continuation of the existing order for LR running at 150 cc/hr, while noting that repeat LA level is currently pending. I have continued the IV Vanc and Rocephin  started in the ED, and ordered prn iv fentanyl  for pt's RLE discomfort. In the setting of his acute alcohol  intoxication, I have ordered thiamine  100 mg iv x 1 dose now. Additionally, I have ordered morning labs which include CMP, CBC, mag, CRP and ESR.        Camelia Cavalier, DO Hospitalist

## 2023-09-21 NOTE — Progress Notes (Signed)
 Pharmacy Antibiotic Note  Jake Nguyen is a 58 y.o. male admitted on 09/20/2023 with cellulitis.  Pharmacy has been consulted for Vancomycin  dosing. WBC WNL. Renal function good.   Plan: Vancomycin  1000 mg IV q12h >>>Estimated AUC: 468 Ceftriaxone  per MD Trend WBC, temp, renal function  F/U infectious work-up Drug levels as indicated  Temp (24hrs), Avg:99 F (37.2 C), Min:98.2 F (36.8 C), Max:99.8 F (37.7 C)  Recent Labs  Lab 09/16/23 2224 09/20/23 2258 09/20/23 2303 09/20/23 2333  WBC 10.0  --   --  7.8  CREATININE 0.69 0.65  --   --   LATICACIDVEN  --   --  2.2*  --     CrCl cannot be calculated (Unknown ideal weight.).    Allergies  Allergen Reactions   Pork-Derived Products Other (See Comments)    Pt states he doesn't eat pork    Silvestre Drum, PharmD, BCPS Clinical Pharmacist Phone: [phone]

## 2023-09-21 NOTE — H&P (Signed)
 History and Physical    Jake Nguyen ZOX:[id number] DOB: Feb 02, 1966 DOA: 09/20/2023  DOS: the patient was seen and examined on 09/20/2023  PCP: Trenton Frock, FNP   Patient coming from: Homeless  I have personally briefly reviewed patient's old medical records in [facility]  HPI:  No notes on file   Jake Nguyen is a 58 y.o. year old male with past medical history of EtOH abuse, depression, anxiety, (L) AKA, and (R) BKA.  She presents to [name] ED for evaluation of right stump infection and pressure injury to buttocks.  Jake Nguyen was recently admitted 08/11/2023 through 09/09/23 for chronic infection of his right lower leg.  He has had multiple recent admissions for the same including gangrene and maggot infestation of the wound.    On interview Jake Nguyen reports that the leg infection has not worsened since discharge 09/09/23 however it also has not improved.  He reports that he finished the course of Augmentin  and doxycycline  that he was prescribed at discharge.  Jake Nguyen endorses mild erythema of the (R) stump, malodorous purulent discharge, and the area being warm to the touch.  No recent trauma that patient can recall.  Of note patient is homeless and has limited ability to keep wounds clean with current social resources, as well as limited access to nutritious meals to support wound healing as such he has been recommended to go to SNF in the past but has unfortunately declined to go.  ED Course: On arrival to this [facility] ED patient was noted to be afebrile temp 36.8 C, BP 100/71, HR 119, RR 16, SpO2 94% on room air.  Plain film of right femur obtained and does not show any osseous abnormality.  Notable for ethanol level of 254,Calcium  7.7, lactic acid 2.2, hemoglobin 11.3, potassium 2.8, magnesium  1.2, CRP 2.1, sed rate 30.  He was given vancomycin  and Rocephin , 1 L normal saline bolus, thiamine , and started on LR at 150 mL/hr. [facility] contacted for  admission.  Review of Systems: As mentioned in the history of present illness. All other systems reviewed and are negative.   Past Medical History:  Diagnosis Date   Acute metabolic encephalopathy 11/14/2020   Alcohol  abuse with alcohol -induced mood disorder (HCC) 11/10/2019   Alcohol  withdrawal syndrome, with delirium (HCC) 11/06/2019   Alcoholism (HCC)    Anxiety    Depression    Influenza A 05/20/2018   Sepsis (HCC) 11/14/2020   Septic shock (HCC) 11/24/2020    Past Surgical History:  Procedure Laterality Date   ABDOMINAL AORTOGRAM W/LOWER EXTREMITY N/A 08/04/2020   Procedure: ABDOMINAL AORTOGRAM W/LOWER EXTREMITY;  Surgeon: Margherita Shell, MD;  Location: [facility] INVASIVE CV LAB;  Service: Cardiovascular;  Laterality: N/A;   AMPUTATION Left 08/05/2020   Procedure: AMPUTATION BELOW KNEE;  Surgeon: Margherita Shell, MD;  Location: [facility] OR;  Service: Vascular;  Laterality: Left;   AMPUTATION Left 11/21/2020   Procedure: REVISION LEFT ABOVE KNEE AMPUTATION;  Surgeon: Margherita Shell, MD;  [facility];  Service: Vascular;  Laterality: Left;   AMPUTATION Right 08/20/2023   Procedure: AMPUTATION BELOW KNEE;  Surgeon: Timothy Ford, MD;  Location: [facility] OR;  Service: Orthopedics;  Laterality: Right;   APPLICATION OF WOUND VAC Right 08/20/2023   Procedure: APPLICATION, WOUND VAC;  Surgeon: Timothy Ford, MD;  Location: [facility] OR;  Service: Orthopedics;  Laterality: Right;     reports that he has been smoking cigarettes. He has never used smokeless tobacco.  He reports current alcohol  use. He reports current drug use. Drug: Marijuana.  Allergies  Allergen Reactions   Pork-Derived Products Other (See Comments)    Pt states he doesn't eat pork    Family History  Family history unknown: Yes    Prior to Admission medications   Medication Sig Start Date End Date Taking? Authorizing Provider  acetaminophen  (TYLENOL ) 325 MG tablet Take 1-2 tablets (325-650 mg total) by mouth every 6 (six) hours  as needed for mild pain (pain score 1-3) (or temp > 100.5). Patient not taking: Reported on 09/04/2023 08/24/23   Elgergawy, Ardia Kraft, MD  amoxicillin -clavulanate (AUGMENTIN ) 875-125 MG tablet Take 1 tablet by mouth every 12 (twelve) hours. Patient not taking: Reported on 09/21/2023 09/08/23   Ephriam Hashimoto, MD  atorvastatin  (LIPITOR) 40 MG tablet Take 1 tablet (40 mg total) by mouth daily. Patient not taking: Reported on 09/04/2023 08/24/23   Elgergawy, Ardia Kraft, MD  doxycycline  (VIBRA -TABS) 100 MG tablet Take 1 tablet (100 mg total) by mouth every 12 (twelve) hours. Patient not taking: Reported on 09/21/2023 09/08/23   Ephriam Hashimoto, MD  oxyCODONE  (OXY IR/ROXICODONE ) 5 MG immediate release tablet Take 1-2 tablets (5-10 mg total) by mouth every 6 (six) hours as needed for severe pain (pain score 7-10). Patient not taking: Reported on 09/04/2023 08/24/23   Elgergawy, Ardia Kraft, MD  thiamine  (VITAMIN B-1) 100 MG tablet Take 1 tablet (100 mg total) by mouth daily. Patient not taking: Reported on 09/04/2023 08/25/23   Elgergawy, Ardia Kraft, MD  triamcinolone  cream (KENALOG ) 0.1 % Apply to the affected area(s) of the skin three times daily as directed Patient not taking: Reported on 09/21/2023 09/09/23   Ephriam Hashimoto, MD    Physical Exam: Vitals:   09/20/23 2039 09/21/23 0154 09/21/23 0550  BP: 100/71  123/69  Pulse: (!) 119  (!) 117  Resp: 16  20  Temp: 98.2 F (36.8 C) 99.8 F (37.7 C) 99.8 F (37.7 C)  TempSrc: Oral Oral Oral  SpO2: 94%  94%     Constitutional: NAD, calm, cooperative Respiratory: clear to auscultation bilaterally, no wheezing, no crackles. Normal respiratory effort. No accessory muscle use.  Cardiovascular: Regular rate and rhythm, no murmurs / rubs / gallops.  2+ radial and +1 popliteal pulses.  Abdomen: soft, non- tender.  Bowel sounds positive x4 quadrants.  Musculoskeletal: (L) AKA; (R) BKA Skin: (R) BKA stump warm to touch, malodorous, no purulent  discharge noted on my exam; Stage I pressure injury to (R) and (L) buttock Neurologic: Alert and oriented x 3.         Labs on Admission: I have personally reviewed following labs and imaging studies  CBC: Recent Labs  Lab 09/16/23 2224 09/20/23 2333 09/21/23 0428  WBC 10.0 7.8 7.7  NEUTROABS  --  3.0 3.5  HGB 11.9* 11.3* 10.1*  HCT 36.0* 34.0* 30.9*  MCV 87.6 86.7 87.5  PLT 325 291 283   Basic Metabolic Panel: Recent Labs  Lab 09/16/23 2224 09/20/23 2258 09/21/23 0428  NA 134* 138 137  K 3.1* 3.6 2.8*  CL 101 102 100  CO2 20* 27 26  GLUCOSE 145* 110* 106*  BUN <5* <5* <5*  CREATININE 0.69 0.65 0.51*  CALCIUM  8.0* 7.7* 7.2*  MG  --   --  1.2*   GFR: CrCl cannot be calculated (Unknown ideal weight.). Liver Function Tests: Recent Labs  Lab 09/16/23 2224 09/21/23 0428  AST 40 20  ALT 20 12  ALKPHOS  73 57  BILITOT 0.6 0.2  PROT 8.3* 6.3*  ALBUMIN 2.7* 1.8*   No results for input(s): LIPASE, AMYLASE in the last 168 hours. No results for input(s): AMMONIA in the last 168 hours. Coagulation Profile: No results for input(s): INR, PROTIME in the last 168 hours. Cardiac Enzymes: No results for input(s): CKTOTAL, CKMB, CKMBINDEX, TROPONINI, TROPONINIHS in the last 168 hours. BNP (last 3 results) Recent Labs    08/21/23 0508 08/22/23 0426 08/23/23 0442  BNP 67.0 32.8 17.4   HbA1C: No results for input(s): HGBA1C in the last 72 hours. CBG: No results for input(s): GLUCAP in the last 168 hours. Lipid Profile: No results for input(s): CHOL, HDL, LDLCALC, TRIG, CHOLHDL, LDLDIRECT in the last 72 hours. Thyroid  Function Tests: No results for input(s): TSH, T4TOTAL, FREET4, T3FREE, THYROIDAB in the last 72 hours. Anemia Panel: No results for input(s): VITAMINB12, FOLATE, FERRITIN, TIBC, IRON , RETICCTPCT in the last 72 hours. Urine analysis:    Component Value Date/Time   COLORURINE YELLOW  03/19/2021 0101   APPEARANCEUR CLEAR 03/19/2021 0101   LABSPEC 1.012 03/19/2021 0101   PHURINE 5.0 03/19/2021 0101   GLUCOSEU 50 (A) 03/19/2021 0101   HGBUR NEGATIVE 03/19/2021 0101   BILIRUBINUR NEGATIVE 03/19/2021 0101   KETONESUR 5 (A) 03/19/2021 0101   PROTEINUR NEGATIVE 03/19/2021 0101   UROBILINOGEN 0.2 08/02/2009 2144   NITRITE NEGATIVE 03/19/2021 0101   LEUKOCYTESUR NEGATIVE 03/19/2021 0101    Radiological Exams on Admission: I have personally reviewed images DG Femur Min 2 Views Right Result Date: 09/20/2023 CLINICAL DATA:  Right leg infection pressure wound EXAM: RIGHT FEMUR 2 VIEWS COMPARISON:  09/03/2023 FINDINGS: Status post below the knee amputation. Smooth cut margins. No osseous destructive change. No soft tissue gas IMPRESSION: Status post below the knee amputation. No acute osseous abnormality Electronically Signed   By: Esmeralda Hedge M.D.   On: 09/20/2023 23:22       Assessment/Plan Principal Problem:   Cellulitis of right lower extremity    ## Sepsis secondary to (R) lower extremity cellulitis Lactic acid 2.4, heart rate 119 - IV Vancomycin  + Rocephin  - Follow up blood cultures - Follow daily CBC, BMP - Plan to transition to PO antibiotics pending clinical course - Ortho Dr. Julio Ohm consulted, appreciate his recommendation and management  ##EtOH use disorder - Librium  taper - CIWA protocol with as needed Ativan  - Thiamine , multivitamin - Counseled on cessation  #Hypokalemia #Hypomagnesemia - 40 mEq p.o. K x2 - 30 mEq IV K - 4 g IV magnesium  - Recheck electrolytes with a.m. labs and replete as needed  # Stage I pressure injury of right buttock, POA # Stage I pressure injury of left buttock, POA - q2H turns and pressure offloading - Apply absorbent foam dressing to each buttock  #Tobacco use disorder - NRT while inpatient with nicotine  patch and as needed Nicorette gum - Counseled on cessation   VTE prophylaxis:  Lovenox , patient has declined  Lovenox  during prior hospitalizations due to not eating pork GI prophylaxis: Protonix  Diet: Regular Access: PIV Lines: None Code Status: Full Telemetry: No Disposition: Admit to MedSurg  Admission status: Inpatient, Med-Surg Patient is from: Homeless Anticipated d/c is to: Anticipated d/c date is:  3-5 days Patient currently: Pending consultation with Ortho and showing early signs of EtOH withdrawal   Family Communication: No family at bedside  Consults called: Dr. Julio Ohm with Ortho   Severity of Illness: The appropriate patient status for this patient is INPATIENT. Inpatient status is judged to be  reasonable and necessary in order to provide the required intensity of service to ensure the patient's safety. The patient's presenting symptoms, physical exam findings, and initial radiographic and laboratory data in the context of their chronic comorbidities is felt to place them at high risk for further clinical deterioration. Furthermore, it is not anticipated that the patient will be medically stable for discharge from the hospital within 2 midnights of admission.   * I certify that at the point of admission it is my clinical judgment that the patient will require inpatient hospital care spanning beyond 2 midnights from the point of admission due to high intensity of service, high risk for further deterioration and high frequency of surveillance required.*  To reach the provider On-Call:   7AM- 7PM see care teams to locate the attending and reach out to them via [email or web address] Password: [email or web address] 7PM-7AM contact night-coverage If you still have difficulty reaching the appropriate provider, please page the [facility] (Director on Call) for [facility] on amion for assistance  This document was prepared using Conservation officer, historic buildings and may include unintentional dictation errors.  Naida Austria FNP-BC, PMHNP-BC Nurse Practitioner [facility]

## 2023-09-21 NOTE — Discharge Instructions (Signed)

## 2023-09-21 NOTE — ED Provider Notes (Signed)
  Physical Exam  BP 100/71 (BP Location: Left Arm)   Pulse (!) 119   Temp 99.8 F (37.7 C) (Oral)   Resp 16   SpO2 94%   Physical Exam  Procedures  .Critical Care  Performed by: Elisa Guest, PA-C Authorized by: Elisa Guest, PA-C   Critical care provider statement:    Critical care time (minutes):  30   Critical care time was exclusive of:  Separately billable procedures and treating other patients   Critical care was necessary to treat or prevent imminent or life-threatening deterioration of the following conditions:  Sepsis   Critical care was time spent personally by me on the following activities:  Development of treatment plan with patient or surrogate, discussions with consultants, evaluation of patient's response to treatment, examination of patient, ordering and review of laboratory studies, ordering and review of radiographic studies, ordering and performing treatments and interventions, pulse oximetry, re-evaluation of patient's condition and review of old charts   Care discussed with: admitting provider     ED Course / MDM    Medical Decision Making Amount and/or Complexity of Data Reviewed Labs: ordered. Radiology: ordered.   Patient care assumed at shift handoff.  Please see Paris Bolds PA-C notes. Patient with history of bilateral BKA, right side completed in May. He presents to the emergency department complaining of possible right sided leg infection.  Upon my assumption of care I activated code sepsis due to patient's heart rate of 118 and lactic acid of 2.2.  He does have what appears to be an infected wound of the right lower extremity also has pressure ulcers on his buttock.  Antibiotics were ordered including Rocephin  and vancomycin .  He was treated with 1 L saline bolus.  His presentation does not currently appear to be consistent with septic shock and I do not feel that 30 cc/kg is required at this time. Plan to discuss case with hospitalist for  possible admission for sepsis due to wound infection. Dr. Brock Canner agrees to see patient for admission.        Elisa Guest, PA-C 09/21/23 0159    Rory Collard, MD 09/21/23 [phone]

## 2023-09-22 DIAGNOSIS — L03115 Cellulitis of right lower limb: Secondary | ICD-10-CM | POA: Diagnosis not present

## 2023-09-22 DIAGNOSIS — E876 Hypokalemia: Secondary | ICD-10-CM | POA: Diagnosis not present

## 2023-09-22 DIAGNOSIS — A419 Sepsis, unspecified organism: Secondary | ICD-10-CM | POA: Diagnosis not present

## 2023-09-22 LAB — BASIC METABOLIC PANEL WITH GFR
Anion gap: 10 (ref 5–15)
BUN: <5 mg/dL — ABNORMAL LOW (ref 6–20)
CO2: 27 mmol/L (ref 22–32)
Calcium: 7.6 mg/dL — ABNORMAL LOW (ref 8.9–10.3)
Chloride: 99 mmol/L (ref 98–111)
Creatinine, Ser: 0.5 mg/dL — ABNORMAL LOW (ref 0.61–1.24)
GFR, Estimated: >60 mL/min (ref >60)
Glucose, Bld: 106 mg/dL — ABNORMAL HIGH (ref 70–99)
Potassium: 3.2 mmol/L — ABNORMAL LOW (ref 3.5–5.1)
Sodium: 136 mmol/L (ref 135–145)

## 2023-09-22 LAB — CBC
HCT: 32.6 % — ABNORMAL LOW (ref 39.0–52.0)
Hemoglobin: 10.6 g/dL — ABNORMAL LOW (ref 13.0–17.0)
MCH: 28.3 pg (ref 26.0–34.0)
MCHC: 32.5 g/dL (ref 30.0–36.0)
MCV: 86.9 fL (ref 80.0–100.0)
Platelets: 267 10*3/uL (ref 150–400)
RBC: 3.75 MIL/uL — ABNORMAL LOW (ref 4.22–5.81)
RDW: 13.1 % (ref 11.5–15.5)
WBC: 6.5 10*3/uL (ref 4.0–10.5)
nRBC: 0 % (ref 0.0–0.2)

## 2023-09-22 LAB — MAGNESIUM: Magnesium: 1.4 mg/dL — ABNORMAL LOW (ref 1.7–2.4)

## 2023-09-22 LAB — PHOSPHORUS: Phosphorus: 3.6 mg/dL (ref 2.5–4.6)

## 2023-09-22 MED ORDER — DOXYCYCLINE HYCLATE 100 MG PO TABS
100.0000 mg | ORAL_TABLET | Freq: Two times a day (BID) | ORAL | Status: AC
  Administered 2023-09-22 – 2023-09-27 (×12): 100 mg via ORAL
  Filled 2023-09-22 (×12): qty 1

## 2023-09-22 MED ORDER — POTASSIUM CHLORIDE CRYS ER 20 MEQ PO TBCR
40.0000 meq | EXTENDED_RELEASE_TABLET | Freq: Four times a day (QID) | ORAL | Status: AC
  Administered 2023-09-22 (×3): 40 meq via ORAL
  Filled 2023-09-22 (×3): qty 2

## 2023-09-22 MED ORDER — MAGNESIUM SULFATE 4 GM/100ML IV SOLN
4.0000 g | Freq: Once | INTRAVENOUS | Status: AC
  Administered 2023-09-22: 4 g via INTRAVENOUS
  Filled 2023-09-22: qty 100

## 2023-09-22 MED ORDER — CEFAZOLIN SODIUM-DEXTROSE 2-4 GM/100ML-% IV SOLN
2.0000 g | Freq: Three times a day (TID) | INTRAVENOUS | Status: DC
  Administered 2023-09-22 – 2023-09-26 (×12): 2 g via INTRAVENOUS
  Filled 2023-09-22 (×12): qty 100

## 2023-09-22 MED ORDER — ENSURE PLUS HIGH PROTEIN PO LIQD
237.0000 mL | Freq: Three times a day (TID) | ORAL | Status: DC
  Administered 2023-09-22: 237 mL via ORAL

## 2023-09-22 NOTE — Progress Notes (Incomplete)
 Initial Nutrition Assessment  DOCUMENTATION CODES:   Non-severe (moderate) malnutrition in context of social or environmental circumstances  INTERVENTION:  Snacks TID between meals  -1 packet Juven BID, each packet provides 95 calories, 2.5 grams of protein (collagen), and 9.8 grams of carbohydrate (3 grams sugar); also contains 7 grams of L-arginine and L-glutamine, 300 mg vitamin C , 15 mg vitamin E, 1.2 mcg vitamin B-12, 9.5 mg zinc , 200 mg calcium , and 1.5 g  Calcium  Beta-hydroxy-Beta-methylbutyrate to support wound healing  Assistance with ordering meal trays Continue MVI w/ minerals Monitor magnesium , potassium, and phosphorus daily for at least 3 days, MD to replete as needed Continue Thiamine  and folic acid  supplementation  NUTRITION DIAGNOSIS:  Moderate Malnutrition related to social / environmental circumstances as evidenced by mild fat depletion, mild muscle depletion.  GOAL:  Patient will meet greater than or equal to 90% of their needs  MONITOR:  PO intake, Skin, Weight trends  REASON FOR ASSESSMENT:  Consult Assessment of nutrition requirement/status  ASSESSMENT:   Pt with PMH significant for: EtOH abuse, depression, anxiety admitted with sepsis 2/2 cellulitis of RLE. Of note, he is with bilateral BKAs and homeless.  R BKA completed in May 2025. Currently showing s/sx of EtOH w/d. Electrolytes being monitored and replaced.   Average Meal Intake No documented intake to review  Unable to endorse 24 hour recall as his intake is variable in regard to amount and source. States he usually can consume at least two meals per day. No problems chewing or swallowing at baseline. Not amicable to ONS at this time, but will accept snack offerings between meals to augment intake.    Admit Weight: 68.9 kg - appears to be pulled forward from last admission Current Weight: 66.1kg  Endorses UBW around 150lbs. Per chart review, has shown no significant weight changes in last 8  months. No edema on exam. Bowels stable. States he goes once at day at baseline.    Intake/Output Summary (Last 24 hours) at 09/23/2023 0811 Last data filed at 09/23/2023 0300 Gross per 24 hour  Intake --  Output 2575 ml  Net -2575 ml    Has required magnesium  and potassium repletion. Recommend magnesium , potassium, phosphorus draws x3 days due to elevated risk of refeeding now that he has reliable food source.   Meds:  Doxycycline  Folic acid  MVI w/ minerals Pantoprazole  Thiamine  IV ABX  Labs:  Na+ 134>136 (wdl) K+ 2.8>3.2 (L) Mg 1.4 (L) Alb 1.8 - suggests poor oral intake as well as stress response to infection/inflammation CBGs 93-106 x24 hours A1c 5.6 (07/2023)  NUTRITION - FOCUSED PHYSICAL EXAM:  Flowsheet Row Most Recent Value  Orbital Region Mild depletion  Upper Arm Region Mild depletion  Thoracic and Lumbar Region Mild depletion  Buccal Region Mild depletion  Temple Region Moderate depletion  Clavicle Bone Region Moderate depletion  Clavicle and Acromion Bone Region Moderate depletion  Scapular Bone Region Moderate depletion  Dorsal Hand Mild depletion  Patellar Region Mild depletion  Anterior Thigh Region Severe depletion  Posterior Calf Region Unable to assess  [b/l BKA]  Edema (RD Assessment) None  Hair Reviewed  Eyes Reviewed  Mouth Reviewed  [poor dentition]  Skin Reviewed  Nails Reviewed      Diet Order:   Diet Order             Diet regular Room service appropriate? Yes; Fluid consistency: Thin  Diet effective now            EDUCATION NEEDS:  Education needs have been addressed  Skin:  Skin Assessment: Skin Integrity Issues: Skin Integrity Issues:: Stage II, Other (Comment) Stage II: R and L buttocks Other: dehiscence of old RLL BKA site  Last BM:  6/12 - type 6 x1  Height:  Ht Readings from Last 1 Encounters:  09/21/23 5' 10 (1.778 m)   Weight:  Wt Readings from Last 1 Encounters:  09/23/23 66.1 kg    BMI:  Body mass index  is 20.91 kg/m.  Estimated Nutritional Needs:   Kcal:  1900-2100kcals  Protein:  100-120g  Fluid:  >1.9L/day  Con Decant MS, RD, LDN Registered Dietitian Clinical Nutrition RD Inpatient Contact Info in Amion

## 2023-09-22 NOTE — Evaluation (Signed)
 Physical Therapy Evaluation Patient Details Name: KHYRIN TREVATHAN MRN: [id number] DOB: 25-Sep-1965 Today's Date: 09/22/2023  History of Present Illness  Pt is a 58 y/o male admitted due to sepsis in setting of cellulitis of R residual limb. Pending ortho consult. Also found to have hypokalemia, hypomagnesemia and stage 1 pressure injuries of buttocks. PMH: etoh use, tobacco use, anxiety, depression, L AKA, R BKA  Clinical Impression  Pt admitted with/for R residual limb cellulitis  Pt has  bil amp and is limited to scooting to EOB and squooting  up/down in the bed. .  Pt currently limited functionally due to the problems listed. ( See problems list.)   Pt will benefit from PT to maximize function and safety in order to get ready for next venue listed below.         If plan is discharge home, recommend the following: A lot of help with walking and/or transfers;A lot of help with bathing/dressing/bathroom;Assist for transportation;Help with stairs or ramp for entrance;Assistance with cooking/housework   Can travel by private vehicle   No    Equipment Recommendations Wheelchair (measurements PT);Wheelchair cushion (measurements PT) (roho cushion)  Recommendations for Other Services  Rehab consult    Functional Status Assessment Patient has had a recent decline in their functional status and demonstrates the ability to make significant improvements in function in a reasonable and predictable amount of time.     Precautions / Restrictions Precautions Precautions: Fall Restrictions Weight Bearing Restrictions Per Provider Order: Yes RLE Weight Bearing Per Provider Order: Non weight bearing LLE Weight Bearing Per Provider Order: Non weight bearing      Mobility  Bed Mobility Overal bed mobility: Needs Assistance Bed Mobility: Rolling Rolling: Supervision   Supine to sit: Supervision, Contact guard     General bed mobility comments: Supervision to roll to side to show OT pressure  sore on L buttocks. Discussed pressure relief and need for a air/gel cushion.  Pt able to boost forward and pivot on bil UE to get to EOB with CGA    Transfers                   General transfer comment: NT as pt deferred today.    Ambulation/Gait               General Gait Details: unable  Stairs            Wheelchair Mobility     Tilt Bed    Modified Rankin (Stroke Patients Only)       Balance Overall balance assessment: Needs assistance Sitting-balance support: Bilateral upper extremity supported Sitting balance-Leahy Scale: Poor Sitting balance - Comments: pt requiring holding onto to rail with R UE, posterior bias, even more when attempting dynamic activity                                     Pertinent Vitals/Pain Pain Assessment Pain Assessment: Faces Faces Pain Scale: Hurts a little bit Pain Location: bottom Pain Descriptors / Indicators: Grimacing, Guarding, Sore, Discomfort Pain Intervention(s): Monitored during session, Limited activity within patient's tolerance    Home Living Family/patient expects to be discharged to:: Shelter/Homeless                   Additional Comments: pt reports his prior boarding house situation didnt work out and has been living on the streets    Prior Function Prior  Level of Function : Needs assist             Mobility Comments: pt reports never getting out of wheelchair, sleeps in wheelchair, etc ADLs Comments: Reports no issues w/ ADLs though pt not forthcoming with this information     Extremity/Trunk Assessment   Upper Extremity Assessment Upper Extremity Assessment: Overall WFL for tasks assessed    Lower Extremity Assessment Lower Extremity Assessment: RLE deficits/detail;LLE deficits/detail RLE Deficits / Details: R BKA, pt able to achieve full knee extension with increased time.  knee flexion to 70 degrees with extra time RLE: Unable to fully assess due to  pain RLE Coordination: decreased fine motor LLE Deficits / Details: hx of Lt AKA, good hip ROM LLE Coordination: decreased fine motor    Cervical / Trunk Assessment Cervical / Trunk Assessment: Normal  Communication   Communication Communication: No apparent difficulties    Cognition Arousal: Alert Behavior During Therapy: WFL for tasks assessed/performed                             Following commands: Intact       Cueing Cueing Techniques: Verbal cues, Gestural cues     General Comments General comments (skin integrity, edema, etc.): vss    Exercises Amputee Exercises Hip Extension: AROM, Right, 5 reps Hip ABduction/ADduction: AROM, Strengthening, 10 reps, Supine Hip Flexion/Marching: Strengthening Knee Flexion: AROM, Right, 10 reps Knee Extension: AROM, Right, 10 reps   Assessment/Plan    PT Assessment Patient needs continued PT services  PT Problem List         PT Treatment Interventions DME instruction;Functional mobility training;Therapeutic activities;Therapeutic exercise;Balance training;Neuromuscular re-education;Stair training;Patient/family education;Wheelchair mobility training;Modalities    PT Goals (Current goals can be found in the Care Plan section)  Acute Rehab PT Goals Patient Stated Goal: walk PT Goal Formulation: With patient Time For Goal Achievement: 10/06/23 Potential to Achieve Goals: Good    Frequency Min 2X/week     Co-evaluation               AM-PAC PT 6 Clicks Mobility  Outcome Measure Help needed turning from your back to your side while in a flat bed without using bedrails?: A Little Help needed moving from lying on your back to sitting on the side of a flat bed without using bedrails?: A Little Help needed moving to and from a bed to a chair (including a wheelchair)?: A Lot Help needed standing up from a chair using your arms (e.g., wheelchair or bedside chair)?: Total Help needed to walk in hospital  room?: Total Help needed climbing 3-5 steps with a railing? : Total 6 Click Score: 11    End of Session Equipment Utilized During Treatment: Gait belt Activity Tolerance: Patient tolerated treatment well;Patient limited by fatigue;Patient limited by pain Patient left: in bed;with call bell/phone within reach Nurse Communication: Mobility status PT Visit Diagnosis: Other abnormalities of gait and mobility (R26.89);Muscle weakness (generalized) (M62.81) Pain - Right/Left: Right Pain - part of body: Leg    Time: 1610-9604 PT Time Calculation (min) (ACUTE ONLY): 31 min   Charges:   PT Evaluation $PT Eval Moderate Complexity: 1 Mod PT Treatments $Therapeutic Activity: 8-22 mins PT General Charges $$ ACUTE PT VISIT: 1 Visit         09/22/2023  Nohemi Batters., PT Acute Rehabilitation Services [phone]  (office)  Durell Gilding Jenifer Struve 09/22/2023, 5:24 PM

## 2023-09-22 NOTE — Evaluation (Signed)
 Physical Therapy Evaluation Patient Details Name: Jake Nguyen MRN: [id number] DOB: 1965-09-09 Today's Date: 09/22/2023  History of Present Illness  Pt is a 58 y/o male admitted due to sepsis in setting of cellulitis of R residual limb. Pending ortho consult. Also found to have hypokalemia, hypomagnesemia and stage 1 pressure injuries of buttocks. PMH: etoh use, tobacco use, anxiety, depression, L AKA, R BKA  Clinical Impression  Pt admitted with/for infection in R BKA site.  Revision pending.  Mobility limited by pain, weakness and likely a few self limiting befave.  Pt currently limited functionally due to the problems listed. ( See problems list.)   Pt will benefit from PT to maximize function and safety in order to get ready for next venue listed below.       If plan is discharge home, recommend the following: A lot of help with walking and/or transfers;A lot of help with bathing/dressing/bathroom;Assist for transportation;Help with stairs or ramp for entrance;Assistance with cooking/housework   Can travel by private vehicle   No    Equipment Recommendations Wheelchair (measurements PT);Wheelchair cushion (measurements PT) (roho cushion)  Recommendations for Other Services  Rehab consult    Functional Status Assessment Patient has had a recent decline in their functional status and demonstrates the ability to make significant improvements in function in a reasonable and predictable amount of time.     Precautions / Restrictions Precautions Precautions: Fall Restrictions Weight Bearing Restrictions Per Provider Order: Yes RLE Weight Bearing Per Provider Order: Non weight bearing LLE Weight Bearing Per Provider Order: Non weight bearing      Mobility  Bed Mobility Overal bed mobility: Needs Assistance Bed Mobility: Rolling Rolling: Supervision   Supine to sit: Supervision, Contact guard     General bed mobility comments: Supervision to roll to side to show OT pressure  sore on L buttocks. Discussed pressure relief and need for a air/gel cushion.  Pt able to boost forward and pivot on bil UE to get to EOB with CGA    Transfers                   General transfer comment: NT as pt deferred today.    Ambulation/Gait               General Gait Details: unable  Stairs            Wheelchair Mobility     Tilt Bed    Modified Rankin (Stroke Patients Only)       Balance Overall balance assessment: Needs assistance Sitting-balance support: Bilateral upper extremity supported Sitting balance-Leahy Scale: Poor Sitting balance - Comments: pt requiring holding onto to rail with R UE, posterior bias, even more when attempting dynamic activity                                     Pertinent Vitals/Pain Pain Assessment Pain Assessment: Faces Faces Pain Scale: Hurts a little bit Pain Location: bottom Pain Descriptors / Indicators: Grimacing, Guarding, Sore, Discomfort Pain Intervention(s): Monitored during session, Limited activity within patient's tolerance    Home Living Family/patient expects to be discharged to:: Shelter/Homeless                   Additional Comments: pt reports his prior boarding house situation didnt work out and has been living on the streets    Prior Function Prior Level of Function : Needs assist  Mobility Comments: pt reports never getting out of wheelchair, sleeps in wheelchair, etc ADLs Comments: Reports no issues w/ ADLs though pt not forthcoming with this information     Extremity/Trunk Assessment   Upper Extremity Assessment Upper Extremity Assessment: Overall WFL for tasks assessed    Lower Extremity Assessment Lower Extremity Assessment: RLE deficits/detail;LLE deficits/detail RLE Deficits / Details: R BKA, pt able to achieve full knee extension with increased time.  knee flexion to 70 degrees with extra time RLE: Unable to fully assess due to  pain RLE Coordination: decreased fine motor LLE Deficits / Details: hx of Lt AKA, good hip ROM LLE Coordination: decreased fine motor    Cervical / Trunk Assessment Cervical / Trunk Assessment: Normal  Communication   Communication Communication: No apparent difficulties    Cognition Arousal: Alert Behavior During Therapy: WFL for tasks assessed/performed                             Following commands: Intact       Cueing Cueing Techniques: Verbal cues, Gestural cues     General Comments General comments (skin integrity, edema, etc.): vss    Exercises Amputee Exercises Hip Extension: AROM, Right, 5 reps Hip ABduction/ADduction: AROM, Strengthening, 10 reps, Supine Hip Flexion/Marching: Strengthening Knee Flexion: AROM, Right, 10 reps Knee Extension: AROM, Right, 10 reps   Assessment/Plan    PT Assessment Patient needs continued PT services  PT Problem List         PT Treatment Interventions DME instruction;Functional mobility training;Therapeutic activities;Therapeutic exercise;Balance training;Neuromuscular re-education;Stair training;Patient/family education;Wheelchair mobility training;Modalities    PT Goals (Current goals can be found in the Care Plan section)  Acute Rehab PT Goals Patient Stated Goal: walk PT Goal Formulation: With patient Time For Goal Achievement: 10/06/23 Potential to Achieve Goals: Good    Frequency Min 2X/week     Co-evaluation               AM-PAC PT 6 Clicks Mobility  Outcome Measure Help needed turning from your back to your side while in a flat bed without using bedrails?: A Little Help needed moving from lying on your back to sitting on the side of a flat bed without using bedrails?: A Little Help needed moving to and from a bed to a chair (including a wheelchair)?: A Lot Help needed standing up from a chair using your arms (e.g., wheelchair or bedside chair)?: Total Help needed to walk in hospital  room?: Total Help needed climbing 3-5 steps with a railing? : Total 6 Click Score: 11    End of Session Equipment Utilized During Treatment: Gait belt Activity Tolerance: Patient tolerated treatment well;Patient limited by fatigue;Patient limited by pain Patient left: in bed;with call bell/phone within reach Nurse Communication: Mobility status PT Visit Diagnosis: Other abnormalities of gait and mobility (R26.89);Muscle weakness (generalized) (M62.81) Pain - Right/Left: Right Pain - part of body: Leg    Time: 1478-2956 PT Time Calculation (min) (ACUTE ONLY): 31 min   Charges:   PT Evaluation $PT Eval Moderate Complexity: 1 Mod PT Treatments $Therapeutic Activity: 8-22 mins PT General Charges $$ ACUTE PT VISIT: 1 Visit         09/22/2023  Nohemi Batters., PT Acute Rehabilitation Services [phone]  (office)  Durell Gilding Larenzo Caples 09/22/2023, 5:29 PM

## 2023-09-22 NOTE — Progress Notes (Signed)
 Patient ID: Jake Nguyen, male   DOB: 1965/11/15, 58 y.o.   MRN: [id number]  Dr. Osborne Blazer asked if I could come by and take a look at this patient's right below-knee amputation stump due to a small area of dehiscence.  He had a right below-knee amputation on Aug 20, 2023 by my partner Dr. Julio Ohm, which was just over 4 weeks ago.  Dr. Julio Ohm is currently out of town.  I did take a look at the right limitations sound.  There is a small area of dehiscence at the lateral aspect of the incision but overall things look good.  There is no evidence of infection.  The area is very small.  I was asked to provide wound care instructions.  For now, I put a small piece of Xeroform over the incision loosely wrapped dry dressing and Ace wrap around his BKA stump.  This actually should suffice over the weekend until my partner can take a look at it on Monday.  It may only need just local wound care.

## 2023-09-22 NOTE — Evaluation (Signed)
 Occupational Therapy Evaluation Patient Details Name: Jake Nguyen MRN: [id number] DOB: Nov 22, 1965 Today's Date: 09/22/2023   History of Present Illness   Pt is a 58 y/o male admitted due to sepsis in setting of cellulitis of R residual limb. Pending ortho consult. Also found to have hypokalemia, hypomagnesemia and stage 1 pressure injuries of buttocks. PMH: etoh use, tobacco use, anxiety, depression, L AKA, R BKA     Clinical Impressions PTA, pt homeless, uses wheelchair for mobility, reports never getting out of wheelchair but able to manage ADLs and obtain meals as needed. Pt presents now fairly close to baseline, able to demo rolling in bed w/o assist but declined OOB transfer at time of OT eval. Pt requires no more than Min A for LB ADL mgmt; reports a preference for AP transfers rather than sliding board so plan to address this in next OT sessions. Provided UE HEP with theraband and squeeze ball to maximize strength and endurance. Pt declines need for SNF rehab; anticipate no OT needs at DC though pt would benefit from wheelchair cushion to prevent worsening of pressure injuries.      If plan is discharge home, recommend the following:   Assist for transportation     Functional Status Assessment   Patient has not had a recent decline in their functional status     Equipment Recommendations   Wheelchair cushion (measurements OT)     Recommendations for Other Services         Precautions/Restrictions   Precautions Precautions: Fall Restrictions Weight Bearing Restrictions Per Provider Order: Yes RLE Weight Bearing Per Provider Order: Non weight bearing LLE Weight Bearing Per Provider Order: Non weight bearing     Mobility Bed Mobility Overal bed mobility: Needs Assistance Bed Mobility: Rolling Rolling: Supervision         General bed mobility comments: Supervision to roll to side to show OT pressure sore on L buttocks. Discussed pressure relief; pt  declined OOB transfer at this time    Transfers                          Balance                                           ADL either performed or assessed with clinical judgement   ADL Overall ADL's : Needs assistance/impaired Eating/Feeding: Independent   Grooming: Set up;Sitting   Upper Body Bathing: Set up;Bed level Upper Body Bathing Details (indicate cue type and reason): to bathe upper arms in bed (dry skin noted). left lotion at bedside- limited by IV nurse arrival Lower Body Bathing: Minimal assistance;Sitting/lateral leans;Bed level   Upper Body Dressing : Set up;Sitting   Lower Body Dressing: Minimal assistance;Sitting/lateral leans;Bed level                       Vision Baseline Vision/History: 0 No visual deficits Ability to See in Adequate Light: 0 Adequate Patient Visual Report: No change from baseline Vision Assessment?: No apparent visual deficits     Perception         Praxis         Pertinent Vitals/Pain Pain Assessment Pain Assessment: Faces Faces Pain Scale: Hurts a little bit Pain Location: bottom Pain Descriptors / Indicators: Grimacing, Guarding, Sore, Discomfort Pain Intervention(s): Monitored during session, Limited activity within  patient's tolerance     Extremity/Trunk Assessment Upper Extremity Assessment Upper Extremity Assessment: Overall WFL for tasks assessed;Right hand dominant   Lower Extremity Assessment Lower Extremity Assessment: Defer to PT evaluation   Cervical / Trunk Assessment Cervical / Trunk Assessment: Normal   Communication Communication Communication: No apparent difficulties   Cognition Arousal: Alert Behavior During Therapy: WFL for tasks assessed/performed Cognition: No apparent impairments             OT - Cognition Comments: likely at baseline                 Following commands: Intact       Cueing  General Comments   Cueing Techniques: Verbal  cues;Gestural cues  Provided UE HEP with level 2-3 theraband w/ handout + squeeze ball due to pt reported enjoying grip strength exercises to help with wheelchair propulsion   Exercises     Shoulder Instructions      Home Living Family/patient expects to be discharged to:: Shelter/Homeless                                 Additional Comments: pt reports his prior boarding house situation didnt work out and has been living on the streets      Prior Functioning/Environment Prior Level of Function : Needs assist             Mobility Comments: pt reports never getting out of wheelchair, sleeps in wheelchair, etc ADLs Comments: Reports no issues w/ ADLs though pt not forthcoming with this information    OT Problem List: Decreased activity tolerance;Impaired balance (sitting and/or standing);Decreased knowledge of precautions;Decreased knowledge of use of DME or AE   OT Treatment/Interventions: Self-care/ADL training;Therapeutic exercise;DME and/or AE instruction;Energy conservation;Therapeutic activities;Patient/family education;Balance training      OT Goals(Current goals can be found in the care plan section)   Acute Rehab OT Goals Patient Stated Goal: not have to have another surgery OT Goal Formulation: With patient Time For Goal Achievement: 10/06/23 Potential to Achieve Goals: Good ADL Goals Pt Will Transfer to Toilet: with set-up;anterior/posterior transfer;bedside commode Pt Will Perform Toileting - Clothing Manipulation and hygiene: with set-up;sitting/lateral leans Pt/caregiver will Perform Home Exercise Program: Increased strength;Both right and left upper extremity;With theraband;Independently;With written HEP provided   OT Frequency:  Min 1X/week    Co-evaluation              AM-PAC OT 6 Clicks Daily Activity     Outcome Measure Help from another person eating meals?: None Help from another person taking care of personal grooming?:  A Little Help from another person toileting, which includes using toliet, bedpan, or urinal?: A Little Help from another person bathing (including washing, rinsing, drying)?: A Little Help from another person to put on and taking off regular upper body clothing?: A Little Help from another person to put on and taking off regular lower body clothing?: A Little 6 Click Score: 19   End of Session    Activity Tolerance: Patient tolerated treatment well Patient left: in bed;with call bell/phone within reach;Other (comment) (IV nurse at bedside)  OT Visit Diagnosis: Other abnormalities of gait and mobility (R26.89)                Time: 1055-1110 OT Time Calculation (min): 15 min Charges:  OT General Charges $OT Visit: 1 Visit OT Evaluation $OT Eval Low Complexity: 1 Low  Concha Deed B, OTR/L Acute Rehab  Services Office: [phone]   Shireen Dory 09/22/2023, 12:46 PM

## 2023-09-22 NOTE — TOC Initial Note (Signed)
 Transition of Care [facility]) - Initial/Assessment Note    Patient Details  Name: Jake Nguyen MRN: [id number] Date of Birth: Aug 19, 1965  [facility] CM/SW Contact:    Jannice Mends, LCSW Phone Number: 09/22/2023, 11:39 AM  Clinical Narrative:                 [place]. Patient has been homeless the past few years and recently discharged back to the street with a wheelchair obtained from a community agency. CSW reached out to the [facility] program to see if there is any assistance they can provide patient at discharge as SNF placement has been difficult due to patient's age, insurance, and need for long term care.   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)   Patient Goals and CMS Choice            Expected Discharge Plan and Services In-house Referral: Clinical Social Work     Living arrangements for the past 2 months: Homeless                                      Prior Living Arrangements/Services Living arrangements for the past 2 months: Homeless Lives with:: Self Patient language and need for interpreter reviewed:: Yes        Need for Family Participation in Patient Care: Yes (Comment) Care giver support system in place?: No (comment)   Criminal Activity/Legal Involvement Pertinent to Current Situation/Hospitalization: No - Comment as needed  Activities of Daily Living   ADL Screening (condition at time of admission) Independently performs ADLs?: Yes (appropriate for developmental age) Is the patient deaf or have difficulty hearing?: No Does the patient have difficulty seeing, even when wearing glasses/contacts?: No Does the patient have difficulty concentrating, remembering, or making decisions?: No  Permission Sought/Granted                  Emotional  Assessment Appearance:: Appears stated age     Orientation: : Oriented to Self, Oriented to Place, Oriented to  Time, Oriented to Situation Alcohol  / Substance Use: Alcohol  Use Psych Involvement: No (comment)  Admission diagnosis:  Wound infection [T14.8XXA, L08.9] Cellulitis of right lower extremity [L03.115] Pressure injury of buttock, stage 1, unspecified laterality [L89.301] Sepsis, due to unspecified organism, unspecified whether acute organ dysfunction present (HCC) [A41.9] Chronic wound [T14.8XXA] Patient Active Problem List   Diagnosis Date Noted   Cellulitis of right lower extremity 09/21/2023   Hypomagnesemia 09/21/2023   Pressure injury of buttock, stage 1 09/21/2023   Gangrene of right foot (HCC) 08/19/2023   Alcohol  use disorder, severe, dependence (HCC) 08/19/2023   Hypotension 08/19/2023   Depression with anxiety 08/19/2023   Tobacco use 08/19/2023   Right foot infection 07/21/2023   Right BKA Wound infection 07/20/2023   Hypothermia    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   Gram-negative bacteremia 11/24/2020   Cellulitis of left leg 11/19/2020   History of alcohol  withdrawal syndrome 11/19/2020   Hypokalemia 11/19/2020   Sepsis (HCC) 11/14/2020   S/P bilateral BKA (below knee amputation) (HCC)    Neuropathic pain, leg, left    Gangrene of foot (HCC) 07/14/2020   Ataxia    Pressure injury of skin 05/25/2018   Rhabdomyolysis 05/20/2018   Hyponatremia 05/20/2018   Elevated troponin  05/20/2018   Moderate dehydration 05/20/2018   Alcohol  use 05/20/2018   Transaminitis 05/20/2018   PCP:  Trenton Frock, FNP Pharmacy:   [facility] [address] [place] - [address] [place] [address] Phone: [phone] Fax: [phone]     Social Drivers of Health (SDOH) Social History: SDOH Screenings   Food Insecurity: Food Insecurity Present (09/21/2023)  Housing: High Risk (09/21/2023)  Transportation Needs:  Unmet Transportation Needs (09/21/2023)  Utilities: Patient Declined (09/21/2023)  Alcohol  Screen: Medium Risk (11/25/2020)  Depression (PHQ2-9): Low Risk  (11/06/2019)  Social Connections: Socially Isolated (07/20/2023)  Tobacco Use: High Risk (09/20/2023)   SDOH Interventions:     Readmission Risk Interventions     No data to display

## 2023-09-22 NOTE — Progress Notes (Signed)
 PROGRESS NOTE    Jake BATCH  [id number] DOB: September 18, 1965 DOA: 09/20/2023 PCP: Trenton Frock, FNP    Chief Complaint  Patient presents with   Wound Infection    Brief Narrative:   Jake Nguyen is a 58 y.o. year old male with past medical history of EtOH abuse, depression, anxiety, (L) AKA, and (R) BKA.  She [name] for evaluation of right stump infection and pressure injury to buttocks.  [name] was recently admitted 08/11/2023 through 09/09/23 for chronic infection of his right lower leg.  He has had multiple recent admissions for the same including gangrene and maggot infestation of the wound.    On interview [name] reports that the leg infection has not worsened since discharge 09/09/23 however it also has not improved.  He reports that he finished the course of Augmentin  and doxycycline  that he was prescribed at discharge.  [name] endorses mild erythema of the (R) stump, malodorous purulent discharge, and the area being warm to the touch.  No recent trauma that patient can recall.  Of note patient is homeless and has limited ability to keep wounds clean with current social resources, as well as limited access to nutritious meals to support wound healing as such he has been recommended to go to SNF in the past but has unfortunately declined to go.   ED Course: On arrival to this [facility] ED patient was noted to be afebrile temp 36.8 C, BP 100/71, HR 119, RR 16, SpO2 94% on room air.  Plain film of right femur obtained and does not show any osseous abnormality.  Notable for ethanol level of 254,Calcium  7.7, lactic acid 2.2, hemoglobin 11.3, potassium 2.8, magnesium  1.2, CRP 2.1, sed rate 30.  He was given vancomycin  and Rocephin , 1 L normal saline bolus, thiamine , and started on LR at 150 mL/hr.  Assessment & Plan:   Principal Problem:   Cellulitis of right lower extremity Active Problems:   Sepsis (HCC)   Hypokalemia   Right BKA Wound  infection   Alcohol  use disorder, severe, dependence (HCC)   Tobacco use   Hypomagnesemia   Pressure injury of buttock, stage 1   Sepsis secondary to right lower extremity wound infection/cellulitis recent right BKA, with wound dehiscence -Sepsis present on admission, tachycardia, with lactic acidosis - IV vancomycin  and Rocephin , will narrow to doxycycline  and cefazolin  - Follow-up blood cultures - Wound appears to be with some dehiscence, infection, this is most likely in the setting of patient poor compliance with wound recommendations. - Ortho to evaluate for wound care recommendations  Alcohol  abuse with withdrawal - Patient with significant withdrawal on presentation, tachycardic, anxious, restless . - Continue with CIWA protocol with Librium  . - Continue with high-dose thiamine  . - Counseled on cessation   Hypokalemia  Hypomagnesemia  -  replaced aggressively yesterday, remains low, will replace further    PCM - albumin significantly low at 1.8, consuled nutritionist   Stage I pressure injury of right buttock, POA Stage I pressure injury of left buttock, POA - q2H turns and pressure offloading - Apply absorbent foam dressing to each buttock   Tobacco use disorder - NRT while inpatient with nicotine  patch and as needed Nicorette  gum - Counseled on cessation    DVT prophylaxis: lovenox  Code Status: Full Family Communication: none at ebdside Disposition:   Status is: Inpatient    Consultants:  Ortho   Subjective:  No significant events overnight, he denies any complaints today  Objective: Vitals:   09/22/23 0000 09/22/23 0400 09/22/23 0500 09/22/23 0725  BP: 115/68 (!) 120/90  120/80  Pulse: 67 90  87  Resp: 17 18  18   Temp:    98.6 F (37 C)  TempSrc:    Oral  SpO2: 98% 92%  100%  Weight:   67.8 kg   Height:        Intake/Output Summary (Last 24 hours) at 09/22/2023 1108 Last data filed at 09/22/2023 0942 Gross per 24 hour  Intake 600 ml   Output 4600 ml  Net -4000 ml   Filed Weights   09/21/23 0700 09/21/23 1329 09/22/23 0500  Weight: 64 kg 68.9 kg 67.8 kg    Examination:  Awake Alert, frail, deconditioned, chronically ill appearing, appears older than stated age Symmetrical Chest wall movement, Good air movement bilaterally, CTAB RRR,No Gallops,Rubs or new Murmurs, No Parasternal Heave +ve B.Sounds, Abd Soft, No tenderness, No rebound - guarding or rigidity. Left KA, right BKA with wound dehiscence.     Data Reviewed: I have personally reviewed following labs and imaging studies  CBC: Recent Labs  Lab 09/16/23 2224 09/20/23 2333 09/21/23 0428 09/22/23 0725  WBC 10.0 7.8 7.7 6.5  NEUTROABS  --  3.0 3.5  --   HGB 11.9* 11.3* 10.1* 10.6*  HCT 36.0* 34.0* 30.9* 32.6*  MCV 87.6 86.7 87.5 86.9  PLT 325 291 283 267    Basic Metabolic Panel: Recent Labs  Lab 09/16/23 2224 09/20/23 2258 09/21/23 0428 09/21/23 1501 09/22/23 0725  NA 134* 138 137 134* 136  K 3.1* 3.6 2.8* 3.2* 3.2*  CL 101 102 100 95* 99  CO2 20* 27 26 30 27   GLUCOSE 145* 110* 106* 93 106*  BUN <5* <5* <5* <5* <5*  CREATININE 0.69 0.65 0.51* 0.59* 0.50*  CALCIUM  8.0* 7.7* 7.2* 7.3* 7.6*  MG  --   --  1.2* 2.0 1.4*  PHOS  --   --   --  2.6 3.6    GFR: Estimated Creatinine Clearance: 98.9 mL/min (A) (by C-G formula based on SCr of 0.5 mg/dL (L)).  Liver Function Tests: Recent Labs  Lab 09/16/23 2224 09/21/23 0428  AST 40 20  ALT 20 12  ALKPHOS 73 57  BILITOT 0.6 0.2  PROT 8.3* 6.3*  ALBUMIN 2.7* 1.8*    CBG: No results for input(s): GLUCAP in the last 168 hours.   Recent Results (from the past 240 hours)  Culture, blood (single)     Status: None (Preliminary result)   Collection Time: 09/21/23 12:13 AM   Specimen: BLOOD LEFT HAND  Result Value Ref Range Status   Specimen Description BLOOD LEFT HAND  Final   Special Requests   Final    BOTTLES DRAWN AEROBIC AND ANAEROBIC Blood Culture adequate volume   Culture    Final    NO GROWTH 1 DAY Performed at [facility], [address], [place], [place] [address]    Report Status PENDING  Incomplete         Radiology Studies: DG Femur Min 2 Views Right Result Date: 09/20/2023 CLINICAL DATA:  Right leg infection pressure wound EXAM: RIGHT FEMUR 2 VIEWS COMPARISON:  09/03/2023 FINDINGS: Status post below the knee amputation. Smooth cut margins. No osseous destructive change. No soft tissue gas IMPRESSION: Status post below the knee amputation. No acute osseous abnormality Electronically Signed   By: Esmeralda Hedge M.D.   On: 09/20/2023 23:22        Scheduled  Meds:  chlordiazePOXIDE   25 mg Oral TID   Followed by   Cecily Cohen ON 09/23/2023] chlordiazePOXIDE   25 mg Oral BH-qamhs   Followed by   Cecily Cohen ON 09/24/2023] chlordiazePOXIDE   25 mg Oral Daily   enoxaparin  (LOVENOX ) injection  40 mg Subcutaneous Q24H   folic acid   1 mg Oral Daily   multivitamin with minerals  1 tablet Oral Daily   nicotine   14 mg Transdermal Daily   pantoprazole   40 mg Oral Daily   [START ON 09/24/2023] thiamine   100 mg Oral Daily   Or   [START ON 09/24/2023] thiamine   100 mg Intravenous Daily   Continuous Infusions:  cefTRIAXone  (ROCEPHIN )  IV 1 g (09/21/23 1751)   thiamine  (VITAMIN B1) injection 500 mg (09/22/23 0940)   vancomycin  1,000 mg (09/22/23 0942)     LOS: 1 day       Seena Dadds, MD [facility]   To contact the attending provider between 7A-7P or the covering provider during after hours 7P-7A, please log into the web site [email or web address] and access using universal [place] password for that web site. If you do not have the password, please call the hospital operator.  09/22/2023, 11:08 AM

## 2023-09-23 DIAGNOSIS — L03115 Cellulitis of right lower limb: Secondary | ICD-10-CM | POA: Diagnosis not present

## 2023-09-23 LAB — CBC
HCT: 34.2 % — ABNORMAL LOW (ref 39.0–52.0)
Hemoglobin: 11.1 g/dL — ABNORMAL LOW (ref 13.0–17.0)
MCH: 28.5 pg (ref 26.0–34.0)
MCHC: 32.5 g/dL (ref 30.0–36.0)
MCV: 87.7 fL (ref 80.0–100.0)
Platelets: 286 10*3/uL (ref 150–400)
RBC: 3.9 MIL/uL — ABNORMAL LOW (ref 4.22–5.81)
RDW: 13.2 % (ref 11.5–15.5)
WBC: 6.3 10*3/uL (ref 4.0–10.5)
nRBC: 0 % (ref 0.0–0.2)

## 2023-09-23 LAB — BASIC METABOLIC PANEL WITH GFR
Anion gap: 7 (ref 5–15)
BUN: <5 mg/dL — ABNORMAL LOW (ref 6–20)
CO2: 26 mmol/L (ref 22–32)
Calcium: 8.1 mg/dL — ABNORMAL LOW (ref 8.9–10.3)
Chloride: 104 mmol/L (ref 98–111)
Creatinine, Ser: 0.48 mg/dL — ABNORMAL LOW (ref 0.61–1.24)
GFR, Estimated: >60 mL/min (ref >60)
Glucose, Bld: 115 mg/dL — ABNORMAL HIGH (ref 70–99)
Potassium: 4 mmol/L (ref 3.5–5.1)
Sodium: 137 mmol/L (ref 135–145)

## 2023-09-23 LAB — PHOSPHORUS: Phosphorus: 3.7 mg/dL (ref 2.5–4.6)

## 2023-09-23 LAB — MAGNESIUM: Magnesium: 1.5 mg/dL — ABNORMAL LOW (ref 1.7–2.4)

## 2023-09-23 MED ORDER — JUVEN PO PACK
1.0000 | PACK | Freq: Two times a day (BID) | ORAL | Status: DC
  Administered 2023-09-23 – 2023-09-29 (×6): 1 via ORAL
  Filled 2023-09-23 (×6): qty 1

## 2023-09-23 NOTE — Progress Notes (Signed)
 PROGRESS NOTE    Jake Nguyen  ZOX:[id number] DOB: 14-Apr-1965 DOA: 09/20/2023 PCP: Trenton Frock, FNP    Chief Complaint  Patient presents with   Wound Infection    Brief Narrative:   Jake Nguyen is a 58 y.o. year old male with past medical history of EtOH abuse, depression, anxiety, (L) AKA, and (R) BKA.  She presents [name] for evaluation of right stump infection and pressure injury to buttocks.  Jake Nguyen was recently admitted 08/11/2023 through 09/09/23 for chronic infection of his right lower leg.  He has had multiple recent admissions for the same including gangrene and maggot infestation of the wound.    On interview Mr. Marling reports that the leg infection has not worsened since discharge 09/09/23 however it also has not improved.  He reports that he finished the course of Augmentin  and doxycycline  that he was prescribed at discharge.  Mr. Hoard endorses mild erythema of the (R) stump, malodorous purulent discharge, and the area being warm to the touch.  No recent trauma that patient can recall.  Of note patient is homeless and has limited ability to keep wounds clean with current social resources, as well as limited access to nutritious meals to support wound healing as such he has been recommended to go to SNF in the past but has unfortunately declined to go.   ED Course: On arrival to this [facility] ED patient was noted to be afebrile temp 36.8 C, BP 100/71, HR 119, RR 16, SpO2 94% on room air.  Plain film of right femur obtained and does not show any osseous abnormality.  Notable for ethanol level of 254,Calcium  7.7, lactic acid 2.2, hemoglobin 11.3, potassium 2.8, magnesium  1.2, CRP 2.1, sed rate 30.  He was given vancomycin  and Rocephin , 1 L normal saline bolus, thiamine , and started on LR at 150 mL/hr.  Assessment & Plan:   Principal Problem:   Cellulitis of right lower extremity Active Problems:   Sepsis (HCC)   Hypokalemia   Right BKA Wound  infection   Alcohol  use disorder, severe, dependence (HCC)   Tobacco use   Hypomagnesemia   Pressure injury of buttock, stage 1   Sepsis secondary to right lower extremity wound infection/cellulitis recent right BKA, with wound dehiscence -Sepsis present on admission, tachycardia, with lactic acidosis - Apparently on IV vancomycin  and Rocephin , now narrowed to doxycycline  and cefazolin  - Follow-up blood cultures, remains negative - Wound appears to be with some dehiscence, infection, this is most likely in the setting of patient poor compliance with wound recommendations.  [place] input greatly appreciated, continue with current wound care, Dr. Julio Ohm to evaluate on Monday  Alcohol  abuse with withdrawal - Patient with significant withdrawal on presentation, tachycardic, anxious, restless . - Continue with CIWA protocol with Librium  . - Continue with high-dose thiamine  . - Counseled on cessation   Hypokalemia  Hypomagnesemia  -  replaced aggressively yesterday, remains low, will replace further    PCM - albumin significantly low at 1.8, consuled nutritionist   Stage I pressure injury of right buttock, POA Stage I pressure injury of left buttock, POA - q2H turns and pressure offloading - Apply absorbent foam dressing to each buttock   Tobacco use disorder - NRT while inpatient with nicotine  patch and as needed Nicorette  gum - Counseled on cessation    DVT prophylaxis: lovenox  Code Status: Full Family Communication: none at ebdside Disposition:   Status is: Inpatient    Consultants:  Ortho  Subjective:  No significant events overnight, he denies any complaints today  Objective: Vitals:   09/23/23 0500 09/23/23 0750 09/23/23 1210 09/23/23 1218  BP:  120/68 113/68 113/76  Pulse:  79 86   Resp:  18 18   Temp:  97.9 F (36.6 C) 98.6 F (37 C) 98.7 F (37.1 C)  TempSrc:  Oral Oral Oral  SpO2:  98% 100% 100%  Weight: 66.1 kg     Height:        Intake/Output  Summary (Last 24 hours) at 09/23/2023 1253 Last data filed at 09/23/2023 1105 Gross per 24 hour  Intake --  Output 2625 ml  Net -2625 ml   Filed Weights   09/21/23 1329 09/22/23 0500 09/23/23 0500  Weight: 68.9 kg 67.8 kg 66.1 kg    Examination:  Awake Alert, Oriented X 3, frail, chronic ill-appearing Symmetrical Chest wall movement, Good air movement bilaterally, CTAB RRR,No Gallops,Rubs or new Murmurs, No Parasternal Heave +ve B.Sounds, Abd Soft, No tenderness, No rebound - guarding or rigidity. Left KA, right BKA with wound dehiscence.     Data Reviewed: I have personally reviewed following labs and imaging studies  CBC: Recent Labs  Lab 09/16/23 2224 09/20/23 2333 09/21/23 0428 09/22/23 0725 09/23/23 0728  WBC 10.0 7.8 7.7 6.5 6.3  NEUTROABS  --  3.0 3.5  --   --   HGB 11.9* 11.3* 10.1* 10.6* 11.1*  HCT 36.0* 34.0* 30.9* 32.6* 34.2*  MCV 87.6 86.7 87.5 86.9 87.7  PLT 325 291 283 267 286    Basic Metabolic Panel: Recent Labs  Lab 09/20/23 2258 09/21/23 0428 09/21/23 1501 09/22/23 0725 09/23/23 0728  NA 138 137 134* 136 137  K 3.6 2.8* 3.2* 3.2* 4.0  CL 102 100 95* 99 104  CO2 27 26 30 27 26   GLUCOSE 110* 106* 93 106* 115*  BUN <5* <5* <5* <5* <5*  CREATININE 0.65 0.51* 0.59* 0.50* 0.48*  CALCIUM  7.7* 7.2* 7.3* 7.6* 8.1*  MG  --  1.2* 2.0 1.4* 1.5*  PHOS  --   --  2.6 3.6 3.7    GFR: Estimated Creatinine Clearance: 96.4 mL/min (A) (by C-G formula based on SCr of 0.48 mg/dL (L)).  Liver Function Tests: Recent Labs  Lab 09/16/23 2224 09/21/23 0428  AST 40 20  ALT 20 12  ALKPHOS 73 57  BILITOT 0.6 0.2  PROT 8.3* 6.3*  ALBUMIN 2.7* 1.8*    CBG: No results for input(s): GLUCAP in the last 168 hours.   Recent Results (from the past 240 hours)  Culture, blood (single)     Status: None (Preliminary result)   Collection Time: 09/21/23 12:13 AM   Specimen: BLOOD LEFT HAND  Result Value Ref Range Status   Specimen Description BLOOD LEFT  HAND  Final   Special Requests   Final    BOTTLES DRAWN AEROBIC AND ANAEROBIC Blood Culture adequate volume   Culture   Final    NO GROWTH 2 DAYS Performed at [facility], [address], [place], [place] [address]    Report Status PENDING  Incomplete         Radiology Studies: No results found.       Scheduled Meds:  chlordiazePOXIDE   25 mg Oral BH-qamhs   Followed by   [START ON 09/24/2023] chlordiazePOXIDE   25 mg Oral Daily   doxycycline   100 mg Oral Q12H   enoxaparin  (LOVENOX ) injection  40 mg Subcutaneous Q24H   folic acid   1 mg  Oral Daily   multivitamin with minerals  1 tablet Oral Daily   nicotine   14 mg Transdermal Daily   nutrition supplement (JUVEN)  1 packet Oral BID BM   pantoprazole   40 mg Oral Daily   [START ON 09/24/2023] thiamine   100 mg Oral Daily   Or   [START ON 09/24/2023] thiamine   100 mg Intravenous Daily   Continuous Infusions:   ceFAZolin  (ANCEF ) IV 2 g (09/23/23 0522)     LOS: 2 days       Seena Dadds, MD [facility]   To contact the attending provider between 7A-7P or the covering provider during after hours 7P-7A, please log into the web site [email or web address] and access using universal [place] password for that web site. If you do not have the password, please call the hospital operator.  09/23/2023, 12:53 PM

## 2023-09-23 NOTE — Plan of Care (Signed)
  Problem: Nutrition: Goal: Adequate nutrition will be maintained Outcome: Progressing   Problem: Coping: Goal: Level of anxiety will decrease Outcome: Progressing   Problem: Elimination: Goal: Will not experience complications related to bowel motility Outcome: Progressing   Problem: Elimination: Goal: Will not experience complications related to urinary retention Outcome: Progressing   

## 2023-09-24 DIAGNOSIS — E440 Moderate protein-calorie malnutrition: Secondary | ICD-10-CM | POA: Insufficient documentation

## 2023-09-24 DIAGNOSIS — L03115 Cellulitis of right lower limb: Secondary | ICD-10-CM | POA: Diagnosis not present

## 2023-09-24 LAB — BASIC METABOLIC PANEL WITH GFR
Anion gap: 9 (ref 5–15)
BUN: <5 mg/dL — ABNORMAL LOW (ref 6–20)
CO2: 29 mmol/L (ref 22–32)
Calcium: 8.8 mg/dL — ABNORMAL LOW (ref 8.9–10.3)
Chloride: 101 mmol/L (ref 98–111)
Creatinine, Ser: 0.58 mg/dL — ABNORMAL LOW (ref 0.61–1.24)
GFR, Estimated: >60 mL/min (ref >60)
Glucose, Bld: 118 mg/dL — ABNORMAL HIGH (ref 70–99)
Potassium: 4.6 mmol/L (ref 3.5–5.1)
Sodium: 139 mmol/L (ref 135–145)

## 2023-09-24 LAB — CBC
HCT: 35.8 % — ABNORMAL LOW (ref 39.0–52.0)
Hemoglobin: 11.5 g/dL — ABNORMAL LOW (ref 13.0–17.0)
MCH: 28.8 pg (ref 26.0–34.0)
MCHC: 32.1 g/dL (ref 30.0–36.0)
MCV: 89.7 fL (ref 80.0–100.0)
Platelets: 303 10*3/uL (ref 150–400)
RBC: 3.99 MIL/uL — ABNORMAL LOW (ref 4.22–5.81)
RDW: 13.2 % (ref 11.5–15.5)
WBC: 6.4 10*3/uL (ref 4.0–10.5)
nRBC: 0 % (ref 0.0–0.2)

## 2023-09-24 LAB — PHOSPHORUS: Phosphorus: 4.3 mg/dL (ref 2.5–4.6)

## 2023-09-24 LAB — MAGNESIUM: Magnesium: 1.3 mg/dL — ABNORMAL LOW (ref 1.7–2.4)

## 2023-09-24 MED ORDER — MAGNESIUM SULFATE 4 GM/100ML IV SOLN
4.0000 g | Freq: Once | INTRAVENOUS | Status: AC
  Administered 2023-09-24: 4 g via INTRAVENOUS
  Filled 2023-09-24: qty 100

## 2023-09-24 NOTE — Plan of Care (Signed)

## 2023-09-24 NOTE — Progress Notes (Signed)
 PROGRESS NOTE    Jake LLERENA  ZOX:[id number] DOB: 1965/08/07 DOA: 09/20/2023 PCP: Trenton Frock, FNP    Chief Complaint  Patient presents with   Wound Infection    Brief Narrative:   Jake Nguyen is a 58 y.o. year old male with past medical history of EtOH abuse, depression, anxiety, (L) AKA, and (R) BKA.  She presents to [name] ED for evaluation of right stump infection and pressure injury to buttocks.  [name] was recently admitted 08/11/2023 through 09/09/23 for chronic infection of his right lower leg.  He has had multiple recent admissions for the same including gangrene and maggot infestation of the wound.    On interview [name] reports that the leg infection has not worsened since discharge 09/09/23 however it also has not improved.  He reports that he finished the course of Augmentin  and doxycycline  that he was prescribed at discharge.  [name] endorses mild erythema of the (R) stump, malodorous purulent discharge, and the area being warm to the touch.  No recent trauma that patient can recall.  Of note patient is homeless and has limited ability to keep wounds clean with current social resources, as well as limited access to nutritious meals to support wound healing as such he has been recommended to go to SNF in the past but has unfortunately declined to go.   ED Course: On arrival to this [facility] ED patient was noted to be afebrile temp 36.8 C, BP 100/71, HR 119, RR 16, SpO2 94% on room air.  Plain film of right femur obtained and does not show any osseous abnormality.  Notable for ethanol level of 254,Calcium  7.7, lactic acid 2.2, hemoglobin 11.3, potassium 2.8, magnesium  1.2, CRP 2.1, sed rate 30.  He was given vancomycin  and Rocephin , 1 L normal saline bolus, thiamine , and started on LR at 150 mL/hr.  Assessment & Plan:   Principal Problem:   Cellulitis of right lower extremity Active Problems:   Sepsis (HCC)   Hypokalemia   Right BKA Wound  infection   Alcohol  use disorder, severe, dependence (HCC)   Tobacco use   Hypomagnesemia   Pressure injury of buttock, stage 1   Malnutrition of moderate degree   Sepsis secondary to right lower extremity wound infection/cellulitis recent right BKA, with wound dehiscence -Sepsis present on admission, tachycardia, with lactic acidosis - Apparently on IV vancomycin  and Rocephin , now narrowed to doxycycline  and cefazolin  - Follow-up blood cultures, remains negative - Wound appears to be with some dehiscence, infection, this is most likely in the setting of patient poor compliance with wound recommendations.  [place]man input greatly appreciated, continue with current wound care, Dr. Julio Ohm to evaluate tomorrow  Alcohol  abuse with withdrawal - Patient with significant withdrawal on presentation, tachycardic, anxious, restless . - Continue with CIWA protocol with Librium  . - Continue with high-dose thiamine  . - Counseled on cessation   Hypokalemia  Hypomagnesemia  -  replaced aggressively yesterday, remains low, will replace further    PCM - albumin significantly low at 1.8, consuled nutritionist   Stage I pressure injury of right buttock, POA Stage I pressure injury of left buttock, POA - q2H turns and pressure offloading - Apply absorbent foam dressing to each buttock   Tobacco use disorder - NRT while inpatient with nicotine  patch and as needed Nicorette  gum - Counseled on cessation    DVT prophylaxis: lovenox  Code Status: Full Family Communication: none at ebdside Disposition:   Status is: Inpatient  Consultants:  Ortho   Subjective:  No significant events overnight, he denies any complaints today  Objective: Vitals:   09/24/23 0000 09/24/23 0400 09/24/23 0739 09/24/23 1135  BP: 105/77  113/83 117/77  Pulse:   88 75  Resp:   18 19  Temp:  98.5 F (36.9 C) 97.9 F (36.6 C) 98.5 F (36.9 C)  TempSrc:  Oral Oral Oral  SpO2:   100%   Weight:      Height:         Intake/Output Summary (Last 24 hours) at 09/24/2023 1302 Last data filed at 09/24/2023 0700 Gross per 24 hour  Intake 1560 ml  Output 2550 ml  Net -990 ml   Filed Weights   09/21/23 1329 09/22/23 0500 09/23/23 0500  Weight: 68.9 kg 67.8 kg 66.1 kg    Examination:   Awake Alert, Oriented X 3, frail. Symmetrical Chest wall movement, Good air movement bilaterally, CTAB RRR,No Gallops,Rubs or new Murmurs, No Parasternal Heave +ve B.Sounds, Abd Soft, No tenderness, No rebound - guarding or rigidity.   Left KA, right BKA with wound dehiscence.     Data Reviewed: I have personally reviewed following labs and imaging studies  CBC: Recent Labs  Lab 09/20/23 2333 09/21/23 0428 09/22/23 0725 09/23/23 0728 09/24/23 0805  WBC 7.8 7.7 6.5 6.3 6.4  NEUTROABS 3.0 3.5  --   --   --   HGB 11.3* 10.1* 10.6* 11.1* 11.5*  HCT 34.0* 30.9* 32.6* 34.2* 35.8*  MCV 86.7 87.5 86.9 87.7 89.7  PLT 291 283 267 286 303    Basic Metabolic Panel: Recent Labs  Lab 09/21/23 0428 09/21/23 1501 09/22/23 0725 09/23/23 0728 09/24/23 0805  NA 137 134* 136 137 139  K 2.8* 3.2* 3.2* 4.0 4.6  CL 100 95* 99 104 101  CO2 26 30 27 26 29   GLUCOSE 106* 93 106* 115* 118*  BUN <5* <5* <5* <5* <5*  CREATININE 0.51* 0.59* 0.50* 0.48* 0.58*  CALCIUM  7.2* 7.3* 7.6* 8.1* 8.8*  MG 1.2* 2.0 1.4* 1.5* 1.3*  PHOS  --  2.6 3.6 3.7 4.3    GFR: Estimated Creatinine Clearance: 96.4 mL/min (A) (by C-G formula based on SCr of 0.58 mg/dL (L)).  Liver Function Tests: Recent Labs  Lab 09/21/23 0428  AST 20  ALT 12  ALKPHOS 57  BILITOT 0.2  PROT 6.3*  ALBUMIN 1.8*    CBG: No results for input(s): GLUCAP in the last 168 hours.   Recent Results (from the past 240 hours)  Culture, blood (single)     Status: None (Preliminary result)   Collection Time: 09/21/23 12:13 AM   Specimen: BLOOD LEFT HAND  Result Value Ref Range Status   Specimen Description BLOOD LEFT HAND  Final   Special Requests    Final    BOTTLES DRAWN AEROBIC AND ANAEROBIC Blood Culture adequate volume   Culture   Final    NO GROWTH 3 DAYS Performed at [facility], [address] [place], [place] [address]    Report Status PENDING  Incomplete         Radiology Studies: No results found.       Scheduled Meds:  doxycycline   100 mg Oral Q12H   enoxaparin  (LOVENOX ) injection  40 mg Subcutaneous Q24H   folic acid   1 mg Oral Daily   multivitamin with minerals  1 tablet Oral Daily   nicotine   14 mg Transdermal Daily   nutrition supplement (JUVEN)  1 packet Oral  BID BM   pantoprazole   40 mg Oral Daily   thiamine   100 mg Oral Daily   Or   thiamine   100 mg Intravenous Daily   Continuous Infusions:   ceFAZolin  (ANCEF ) IV 2 g (09/24/23 0606)     LOS: 3 days       Seena Dadds, MD [facility]   To contact the attending provider between 7A-7P or the covering provider during after hours 7P-7A, please log into the web site [email or web address] and access using universal [place] password for that web site. If you do not have the password, please call the hospital operator.  09/24/2023, 1:02 PM

## 2023-09-25 DIAGNOSIS — L03115 Cellulitis of right lower limb: Secondary | ICD-10-CM | POA: Diagnosis not present

## 2023-09-25 LAB — CBC
HCT: 33.6 % — ABNORMAL LOW (ref 39.0–52.0)
Hemoglobin: 11.1 g/dL — ABNORMAL LOW (ref 13.0–17.0)
MCH: 29.1 pg (ref 26.0–34.0)
MCHC: 33 g/dL (ref 30.0–36.0)
MCV: 88 fL (ref 80.0–100.0)
Platelets: 279 10*3/uL (ref 150–400)
RBC: 3.82 MIL/uL — ABNORMAL LOW (ref 4.22–5.81)
RDW: 13.2 % (ref 11.5–15.5)
WBC: 5.6 10*3/uL (ref 4.0–10.5)
nRBC: 0 % (ref 0.0–0.2)

## 2023-09-25 LAB — BASIC METABOLIC PANEL WITH GFR
Anion gap: 9 (ref 5–15)
BUN: <5 mg/dL — ABNORMAL LOW (ref 6–20)
CO2: 24 mmol/L (ref 22–32)
Calcium: 8 mg/dL — ABNORMAL LOW (ref 8.9–10.3)
Chloride: 100 mmol/L (ref 98–111)
Creatinine, Ser: 0.58 mg/dL — ABNORMAL LOW (ref 0.61–1.24)
GFR, Estimated: >60 mL/min (ref >60)
Glucose, Bld: 128 mg/dL — ABNORMAL HIGH (ref 70–99)
Potassium: 3.5 mmol/L (ref 3.5–5.1)
Sodium: 133 mmol/L — ABNORMAL LOW (ref 135–145)

## 2023-09-25 LAB — PHOSPHORUS: Phosphorus: 4.1 mg/dL (ref 2.5–4.6)

## 2023-09-25 LAB — MAGNESIUM: Magnesium: 1.6 mg/dL — ABNORMAL LOW (ref 1.7–2.4)

## 2023-09-25 MED ORDER — POTASSIUM CHLORIDE CRYS ER 20 MEQ PO TBCR
40.0000 meq | EXTENDED_RELEASE_TABLET | Freq: Once | ORAL | Status: AC
  Administered 2023-09-25: 40 meq via ORAL
  Filled 2023-09-25: qty 2

## 2023-09-25 MED ORDER — TRAZODONE HCL 50 MG PO TABS
50.0000 mg | ORAL_TABLET | Freq: Every evening | ORAL | Status: DC | PRN
  Administered 2023-09-26 – 2023-09-28 (×3): 50 mg via ORAL
  Filled 2023-09-25 (×3): qty 1

## 2023-09-25 NOTE — NC FL2 (Signed)
 [place]  MEDICAID [id number] LEVEL OF CARE FORM     IDENTIFICATION  Patient Name: Jake Nguyen Birthdate: 1965-09-18 Sex: male Admission Date (Current Location): 09/20/2023  [facility] and [place] Number:  Producer, television/film/video and Address:  [facility] [place], [address] [place], [place] [address]      Provider Number: [id number]  Attending Physician Name and Address:  Elgergawy, Ardia Kraft, MD  Relative Name and Phone Number:       Current Level of Care: Hospital Recommended Level of Care: Skilled Nursing Facility Prior Approval Number:    Date Approved/Denied:   PASRR Number: [id number]  Discharge Plan: SNF    Current Diagnoses: Patient Active Problem List   Diagnosis Date Noted   Malnutrition of moderate degree 09/24/2023   Cellulitis of right lower extremity 09/21/2023   Hypomagnesemia 09/21/2023   Pressure injury of buttock, stage 1 09/21/2023   Gangrene of right foot (HCC) 08/19/2023   Alcohol  use disorder, severe, dependence (HCC) 08/19/2023   Hypotension 08/19/2023   Depression with anxiety 08/19/2023   Tobacco use 08/19/2023   Right foot infection 07/21/2023   Right BKA Wound infection 07/20/2023   Hypothermia    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   Gram-negative bacteremia 11/24/2020   Cellulitis of left leg 11/19/2020   History of alcohol  withdrawal syndrome 11/19/2020   Hypokalemia 11/19/2020   Sepsis (HCC) 11/14/2020   S/P bilateral BKA (below knee amputation) (HCC)    Neuropathic pain, leg, left    Gangrene of foot (HCC) 07/14/2020   Ataxia    Pressure injury of skin 05/25/2018   Rhabdomyolysis 05/20/2018   Hyponatremia 05/20/2018   Elevated troponin 05/20/2018   Moderate dehydration 05/20/2018   Alcohol  use 05/20/2018   Transaminitis 05/20/2018    Orientation RESPIRATION BLADDER Height & Weight     Self, Time, Situation, Place  Normal Continent Weight: 145 lb 11.6 oz (66.1 kg) Height:  5' 10 (177.8 cm)  BEHAVIORAL  SYMPTOMS/MOOD NEUROLOGICAL BOWEL NUTRITION STATUS      Continent Diet (See dc summary)  AMBULATORY STATUS COMMUNICATION OF NEEDS Skin   Limited Assist Verbally PU Stage and Appropriate Care (Stage II on buttocks; incision on amputation)                       Personal Care Assistance Level of Assistance  Bathing, Feeding, Dressing Bathing Assistance: Limited assistance Feeding assistance: Independent Dressing Assistance: Limited assistance     Functional Limitations Info  Sight Sight Info: Impaired        SPECIAL CARE FACTORS FREQUENCY  PT (By licensed PT), OT (By licensed OT)     PT Frequency: Eval and treat OT Frequency: Eval and treat            Contractures Contractures Info: Not present    Additional Factors Info  Code Status, Allergies Code Status Info: Full Allergies Info: Pork derived products           Current Medications (09/25/2023):  This is the current hospital active medication list Current Facility-Administered Medications  Medication Dose Route Frequency Provider Last Rate Last Admin   acetaminophen  (TYLENOL ) tablet 650 mg  650 mg Oral Q6H PRN Howerter, Justin B, DO       Or   acetaminophen  (TYLENOL ) suppository 650 mg  650 mg Rectal Q6H PRN Howerter, Justin B, DO       ceFAZolin  (ANCEF ) IVPB 2g/100 mL premix  2 g Intravenous Q8H Elgergawy, Dawood S, MD 200 mL/hr at  09/25/23 0528 2 g at 09/25/23 0528   doxycycline  (VIBRA -TABS) tablet 100 mg  100 mg Oral Q12H Elgergawy, Dawood S, MD   100 mg at 09/25/23 1054   enoxaparin  (LOVENOX ) injection 40 mg  40 mg Subcutaneous Q24H Foust, Katy L, NP   40 mg at 09/25/23 1054   fentaNYL  (SUBLIMAZE ) injection 25 mcg  25 mcg Intravenous Q2H PRN Howerter, Justin B, DO       folic acid  (FOLVITE ) tablet 1 mg  1 mg Oral Daily Foust, Katy L, NP   1 mg at 09/25/23 1054   melatonin tablet 3 mg  3 mg Oral QHS PRN Howerter, Justin B, DO   3 mg at 09/22/23 2157   multivitamin with minerals tablet 1 tablet  1 tablet Oral  Daily Foust, Katy L, NP   1 tablet at 09/25/23 1054   naloxone  (NARCAN ) injection 0.4 mg  0.4 mg Intravenous PRN Howerter, Justin B, DO       nicotine  (NICODERM CQ  - dosed in mg/24 hours) patch 14 mg  14 mg Transdermal Daily Foust, Katy L, NP       nicotine  polacrilex (NICORETTE ) gum 2 mg  2 mg Oral PRN Foust, Katy L, NP       nutrition supplement (JUVEN) (JUVEN) powder packet 1 packet  1 packet Oral BID BM Elgergawy, Dawood S, MD   1 packet at 09/25/23 1054   ondansetron  (ZOFRAN ) injection 4 mg  4 mg Intravenous Q6H PRN Howerter, Justin B, DO       pantoprazole  (PROTONIX ) EC tablet 40 mg  40 mg Oral Daily Foust, Katy L, NP   40 mg at 09/25/23 1054   polyethylene glycol (MIRALAX  / GLYCOLAX ) packet 17 g  17 g Oral Daily PRN Foust, Katy L, NP       potassium chloride  SA (KLOR-CON  M) CR tablet 40 mEq  40 mEq Oral Once Elgergawy, Dawood S, MD       thiamine  (VITAMIN B1) tablet 100 mg  100 mg Oral Daily Elgergawy, Dawood S, MD   100 mg at 09/25/23 1054   Or   thiamine  (VITAMIN B1) injection 100 mg  100 mg Intravenous Daily Elgergawy, Dawood S, MD         Discharge Medications: Please see discharge summary for a list of discharge medications.  Relevant Imaging Results:  Relevant Lab Results:   Additional Information SSN: [id number]  Delilah Fend Kosisochukwu Goldberg, LCSW

## 2023-09-25 NOTE — Progress Notes (Signed)
 PROGRESS NOTE    Jake Nguyen  ZOX:[id number] DOB: 08-14-1965 DOA: 09/20/2023 PCP: Trenton Frock, FNP    Chief Complaint  Patient presents with   Wound Infection    Brief Narrative:   Jake Nguyen is a 58 y.o. year old male with past medical history of EtOH abuse, depression, anxiety, (L) AKA, and (R) BKA.  She presents to [name] ED for evaluation of right stump infection and pressure injury to buttocks.  Jake Nguyen was recently admitted 08/11/2023 through 09/09/23 for chronic infection of his right lower leg.  He has had multiple recent admissions for the same including gangrene and maggot infestation of the wound.    On interview Jake Nguyen reports that the leg infection has not worsened since discharge 09/09/23 however it also has not improved.  He reports that he finished the course of Augmentin  and doxycycline  that he was prescribed at discharge.  Jake Nguyen endorses mild erythema of the (R) stump, malodorous purulent discharge, and the area being warm to the touch.  No recent trauma that patient can recall.  Of note patient is homeless and has limited ability to keep wounds clean with current social resources, as well as limited access to nutritious meals to support wound healing as such he has been recommended to go to SNF in the past but has unfortunately declined to go.   ED Course: On arrival to this [facility] ED patient was noted to be afebrile temp 36.8 C, BP 100/71, HR 119, RR 16, SpO2 94% on room air.  Plain film of right femur obtained and does not show any osseous abnormality.  Notable for ethanol level of 254,Calcium  7.7, lactic acid 2.2, hemoglobin 11.3, potassium 2.8, magnesium  1.2, CRP 2.1, sed rate 30.  He was given vancomycin  and Rocephin , 1 L normal saline bolus, thiamine , and started on LR at 150 mL/hr.  Assessment & Plan:   Principal Problem:   Cellulitis of right lower extremity Active Problems:   Sepsis (HCC)   Hypokalemia   Right BKA Wound  infection   Alcohol  use disorder, severe, dependence (HCC)   Tobacco use   Hypomagnesemia   Pressure injury of buttock, stage 1   Malnutrition of moderate degree   Sepsis secondary to right lower extremity wound infection/cellulitis recent right BKA, with wound dehiscence -Sepsis present on admission, tachycardia, with lactic acidosis - Apparently on IV vancomycin  and Rocephin , now narrowed to doxycycline  and cefazolin  - Follow-up blood cultures, remains negative - Wound appears to be with some dehiscence, infection, this is most likely in the setting of patient poor compliance with wound recommendations.  Jake Nguyen input greatly appreciated, continue with current wound care, Dr. Julio Ohm to evaluate today regarding further recommendations.  Alcohol  abuse with withdrawal - Patient with significant withdrawal on presentation, tachycardic, anxious, restless . - CIWA protocol with Librium  .  Has been tapered off - PT did with high-dose thiamine  .  He is on thiamine  100 mg oral daily. - Counseled on cessation   Hypokalemia  Hypomagnesemia  -  replaced aggressively yesterday, remains low, will replace further    PCM - albumin significantly low at 1.8, consuled nutritionist   Stage I pressure injury of right buttock, POA Stage I pressure injury of left buttock, POA - q2H turns and pressure offloading - Apply absorbent foam dressing to each buttock   Tobacco use disorder - NRT while inpatient with nicotine  patch and as needed Nicorette  gum - Counseled on cessation    DVT prophylaxis: lovenox   Code Status: Full Family Communication: none at ebdside Disposition:   Status is: Inpatient    Consultants:  Ortho   Subjective:  No significant events overnight, he denies any complaints today.  Objective: Vitals:   09/24/23 2350 09/25/23 0359 09/25/23 0756 09/25/23 1151  BP: 108/70 99/68 103/81 113/77  Pulse: 88 89 92 97  Resp: 19 17    Temp: 98.3 F (36.8 C) 98 F (36.7 C)  97.9 F (36.6 C) 98.6 F (37 C)  TempSrc: Oral Oral Oral Oral  SpO2:    96%  Weight:      Height:        Intake/Output Summary (Last 24 hours) at 09/25/2023 1345 Last data filed at 09/25/2023 0528 Gross per 24 hour  Intake 799.97 ml  Output 2200 ml  Net -1400.03 ml   Filed Weights   09/21/23 1329 09/22/23 0500 09/23/23 0500  Weight: 68.9 kg 67.8 kg 66.1 kg    Examination:   Awake Alert, Oriented X 3, Symmetrical Chest wall movement, Good air movement bilaterally, CTAB RRR,No Gallops,Rubs or new Murmurs, No Parasternal Heave +ve B.Sounds, Abd Soft, No tenderness, No rebound - guarding or rigidity. Left KA, right BKA with wound dehiscence.     Data Reviewed: I have personally reviewed following labs and imaging studies  CBC: Recent Labs  Lab 09/20/23 2333 09/21/23 0428 09/22/23 0725 09/23/23 0728 09/24/23 0805 09/25/23 0544  WBC 7.8 7.7 6.5 6.3 6.4 5.6  NEUTROABS 3.0 3.5  --   --   --   --   HGB 11.3* 10.1* 10.6* 11.1* 11.5* 11.1*  HCT 34.0* 30.9* 32.6* 34.2* 35.8* 33.6*  MCV 86.7 87.5 86.9 87.7 89.7 88.0  PLT 291 283 267 286 303 279    Basic Metabolic Panel: Recent Labs  Lab 09/21/23 1501 09/22/23 0725 09/23/23 0728 09/24/23 0805 09/25/23 0544  NA 134* 136 137 139 133*  K 3.2* 3.2* 4.0 4.6 3.5  CL 95* 99 104 101 100  CO2 30 27 26 29 24   GLUCOSE 93 106* 115* 118* 128*  BUN <5* <5* <5* <5* <5*  CREATININE 0.59* 0.50* 0.48* 0.58* 0.58*  CALCIUM  7.3* 7.6* 8.1* 8.8* 8.0*  MG 2.0 1.4* 1.5* 1.3* 1.6*  PHOS 2.6 3.6 3.7 4.3 4.1    GFR: Estimated Creatinine Clearance: 96.4 mL/min (A) (by C-G formula based on SCr of 0.58 mg/dL (L)).  Liver Function Tests: Recent Labs  Lab 09/21/23 0428  AST 20  ALT 12  ALKPHOS 57  BILITOT 0.2  PROT 6.3*  ALBUMIN 1.8*    CBG: No results for input(s): GLUCAP in the last 168 hours.   Recent Results (from the past 240 hours)  Culture, blood (single)     Status: None (Preliminary result)   Collection Time:  09/21/23 12:13 AM   Specimen: BLOOD LEFT HAND  Result Value Ref Range Status   Specimen Description BLOOD LEFT HAND  Final   Special Requests   Final    BOTTLES DRAWN AEROBIC AND ANAEROBIC Blood Culture adequate volume   Culture   Final    NO GROWTH 4 DAYS Performed at [facility], [address] [place], [place] [address]    Report Status PENDING  Incomplete         Radiology Studies: No results found.       Scheduled Meds:  doxycycline   100 mg Oral Q12H   enoxaparin  (LOVENOX ) injection  40 mg Subcutaneous Q24H   folic acid   1 mg Oral Daily  multivitamin with minerals  1 tablet Oral Daily   nicotine   14 mg Transdermal Daily   nutrition supplement (JUVEN)  1 packet Oral BID BM   pantoprazole   40 mg Oral Daily   thiamine   100 mg Oral Daily   Or   thiamine   100 mg Intravenous Daily   Continuous Infusions:   ceFAZolin  (ANCEF ) IV 2 g (09/25/23 0528)     LOS: 4 days       Seena Dadds, MD [facility]   To contact the attending provider between 7A-7P or the covering provider during after hours 7P-7A, please log into the web site [email or web address] and access using universal [place] password for that web site. If you do not have the password, please call the hospital operator.  09/25/2023, 1:45 PM

## 2023-09-25 NOTE — Progress Notes (Signed)
 Physical Therapy Treatment Patient Details Name: Jake Nguyen MRN: [id number] DOB: [birth year] Today's Date: 09/25/2023   History of Present Illness Pt is a 58 y/o male admitted 6/11 due to sepsis in setting of cellulitis of R residual limb. Pending ortho consult. Also found to have hypokalemia, hypomagnesemia and stage 1 pressure injuries of buttocks. PMH: etoh use, tobacco use, anxiety, depression, L AKA, R BKA    PT Comments  Agreeable to work with therapy. Tolerated bed mobility training with emphasis on techniques to reduce friction on buttocks due to reported pressure sores. Sat EOB with min assist to correct intermittent posterior LOB. Able to use UEs to lift bottom and scoot along bed, great clearance of buttock to minimize shear forces. Declines OOB due to bowel urgency. Assisted with bed pad change and bed pan in place. Sheets heavily soiled beneath. RN notified via secure chat. Patient will continue to benefit from skilled physical therapy services to further improve independence with functional mobility.    If plan is discharge home, recommend the following: A lot of help with walking and/or transfers;A lot of help with bathing/dressing/bathroom;Assist for transportation;Help with stairs or ramp for entrance;Assistance with cooking/housework   Can travel by private vehicle     No  Equipment Recommendations  Other (comment) (TBD next venue)    Recommendations for Other Services       Precautions / Restrictions Precautions Precautions: Fall Precaution Booklet Issued: Yes (comment) Recall of Precautions/Restrictions: Impaired Restrictions Weight Bearing Restrictions Per Provider Order: Yes RLE Weight Bearing Per Provider Order: Non weight bearing LLE Weight Bearing Per Provider Order: Non weight bearing Other Position/Activity Restrictions: hx of L AKA     Mobility  Bed Mobility Overal bed mobility: Needs Assistance Bed Mobility: Rolling, Sidelying to Sit, Sit to  Supine Rolling: Supervision Sidelying to sit: Min assist   Sit to supine: Contact guard assist   General bed mobility comments: Educated on techniques to reduce friction on buttocks via log roll. Cues for technique. Min assist for LOB towards posterior upon rising. Able to perform return to supine without physical assist.    Transfers Overall transfer level: Needs assistance Equipment used: None              Lateral/Scoot Transfers: Min assist General transfer comment: Min assist for balance with lateral scoot along bed. Does an excellent job of pressing through UEs to fully clear buttocks during transitions. Deferred OOB due to bowel urgency asking for bed pan.    Ambulation/Gait               General Gait Details: unable   Stairs             Wheelchair Mobility     Tilt Bed    Modified Rankin (Stroke Patients Only)       Balance Overall balance assessment: Needs assistance Sitting-balance support: Bilateral upper extremity supported Sitting balance-Leahy Scale: Poor Sitting balance - Comments: Can sit intermittently without UE support but required a couple of interventions due to LOB.                                    Communication Communication Communication: No apparent difficulties  Cognition Arousal: Alert Behavior During Therapy: WFL for tasks assessed/performed   PT - Cognitive impairments: No apparent impairments  PT - Cognition Comments: pt A&Ox4, pt with decreased insight to safety and deficits and noted Following commands: Intact      Cueing Cueing Techniques: Verbal cues  Exercises      General Comments General comments (skin integrity, edema, etc.): Bed soiled with green foul smelling liquid. Bed pad changed and bed pan placed. RN notified of time on bed pan and need for linens to be changed.      Pertinent Vitals/Pain Pain Assessment Pain Assessment: Faces Faces Pain Scale:  Hurts little more Pain Location: bottom Pain Descriptors / Indicators: Grimacing, Guarding, Sore Pain Intervention(s): Monitored during session, Repositioned    Home Living                          Prior Function            PT Goals (current goals can now be found in the care plan section) Acute Rehab PT Goals Patient Stated Goal: walk PT Goal Formulation: With patient Time For Goal Achievement: 10/06/23 Potential to Achieve Goals: Good Progress towards PT goals: Progressing toward goals    Frequency    Min 2X/week      PT Plan      Co-evaluation              AM-PAC PT 6 Clicks Mobility   Outcome Measure  Help needed turning from your back to your side while in a flat bed without using bedrails?: A Little Help needed moving from lying on your back to sitting on the side of a flat bed without using bedrails?: A Little Help needed moving to and from a bed to a chair (including a wheelchair)?: A Lot Help needed standing up from a chair using your arms (e.g., wheelchair or bedside chair)?: Total Help needed to walk in hospital room?: Total Help needed climbing 3-5 steps with a railing? : Total 6 Click Score: 11    End of Session Equipment Utilized During Treatment: Gait belt Activity Tolerance: Patient tolerated treatment well (bowel urgency) Patient left: in bed;with call bell/phone within reach;with bed alarm set Nurse Communication: Mobility status (linens need changed, heavily soiled. Pt on bed pan at 1430. Unsure if call bell fully functioning, needs checked as i could not hear secretary response when called several times. consider air mattress due to pressure sore as indicated in hx.) PT Visit Diagnosis: Muscle weakness (generalized) (M62.81);Difficulty in walking, not elsewhere classified (R26.2);Pain Pain - part of body:  (buttocks)     Time: 1610-9604 PT Time Calculation (min) (ACUTE ONLY): 20 min  Charges:    $Therapeutic Activity:  8-22 mins PT General Charges $$ ACUTE PT VISIT: 1 Visit                     Jory Ng, PT, DPT [facility]  Rehabilitation Services Physical Therapist Office: [phone] Website: [email or web address]    Alinda Irani 09/25/2023, 3:18 PM

## 2023-09-25 NOTE — Plan of Care (Signed)

## 2023-09-25 NOTE — TOC Progression Note (Signed)
 Transition of Care [facility]) - Progression Note    Patient Details  Name: Jake Nguyen MRN: [id number] Date of Birth: 1965/06/11  [facility]) CM/SW Contact  Jannice Mends, LCSW Phone Number: 09/25/2023, 5:29 PM  Clinical Narrative:    CSW continuing to follow. Multiple placement barriers exist.    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)  Expected Discharge Plan and Services In-house Referral: Clinical Social Work     Living arrangements for the past 2 months: Homeless                                       Social Determinants of Health (SDOH) Interventions SDOH Screenings   Food Insecurity: Food Insecurity Present (09/21/2023)  Housing: High Risk (09/21/2023)  Transportation Needs: Unmet Transportation Needs (09/21/2023)  Utilities: Patient Declined (09/21/2023)  Alcohol  Screen: Medium Risk (11/25/2020)  Depression (PHQ2-9): Low Risk  (11/06/2019)  Social Connections: Socially Isolated (07/20/2023)  Tobacco Use: High Risk (09/20/2023)    Readmission Risk Interventions     No data to display

## 2023-09-26 DIAGNOSIS — L03115 Cellulitis of right lower limb: Secondary | ICD-10-CM | POA: Diagnosis not present

## 2023-09-26 LAB — CBC
HCT: 34.8 % — ABNORMAL LOW (ref 39.0–52.0)
Hemoglobin: 11.1 g/dL — ABNORMAL LOW (ref 13.0–17.0)
MCH: 28.2 pg (ref 26.0–34.0)
MCHC: 31.9 g/dL (ref 30.0–36.0)
MCV: 88.3 fL (ref 80.0–100.0)
Platelets: 278 10*3/uL (ref 150–400)
RBC: 3.94 MIL/uL — ABNORMAL LOW (ref 4.22–5.81)
RDW: 13.3 % (ref 11.5–15.5)
WBC: 6.5 10*3/uL (ref 4.0–10.5)
nRBC: 0 % (ref 0.0–0.2)

## 2023-09-26 LAB — BASIC METABOLIC PANEL WITH GFR
Anion gap: 8 (ref 5–15)
BUN: 5 mg/dL — ABNORMAL LOW (ref 6–20)
CO2: 23 mmol/L (ref 22–32)
Calcium: 8.4 mg/dL — ABNORMAL LOW (ref 8.9–10.3)
Chloride: 103 mmol/L (ref 98–111)
Creatinine, Ser: 0.54 mg/dL — ABNORMAL LOW (ref 0.61–1.24)
GFR, Estimated: >60 mL/min (ref >60)
Glucose, Bld: 110 mg/dL — ABNORMAL HIGH (ref 70–99)
Potassium: 4.1 mmol/L (ref 3.5–5.1)
Sodium: 134 mmol/L — ABNORMAL LOW (ref 135–145)

## 2023-09-26 LAB — CULTURE, BLOOD (SINGLE)
Culture: NO GROWTH
Special Requests: ADEQUATE

## 2023-09-26 LAB — MAGNESIUM: Magnesium: 1.5 mg/dL — ABNORMAL LOW (ref 1.7–2.4)

## 2023-09-26 LAB — PHOSPHORUS: Phosphorus: 4.3 mg/dL (ref 2.5–4.6)

## 2023-09-26 MED ORDER — AMOXICILLIN-POT CLAVULANATE 875-125 MG PO TABS
1.0000 | ORAL_TABLET | Freq: Two times a day (BID) | ORAL | Status: AC
  Administered 2023-09-26 – 2023-09-27 (×4): 1 via ORAL
  Filled 2023-09-26 (×4): qty 1

## 2023-09-26 NOTE — TOC Progression Note (Signed)
 Transition of Care [facility]) - Progression Note    Patient Details  Name: Jake Nguyen MRN: [id number] Date of Birth: October 10, 1965  Transition [facility] Care [facility]) CM/SW Contact  Jannice Mends, LCSW Phone Number: 09/26/2023, 3:30 PM  Clinical Narrative:    CSW met with patient. He confirmed he receives less than $2000 a month in Disability which he uses on a hotel until the money runs out and then he sleeps outside. His wheelchair is in the room. He has no social support. He reported being agreeable to CSW submitting referral to Transitions to [facility] as suggested by [employer]. CSW submitted referral online, Ref# [id number]. Patient does not have a phone but does use the IRC if people need to contact him. He has lost his ID card and Bank Card. CSW encouraged him to talk with the [facility] to see if they can help get him a new ID so he can go to the SSI office for a new card. CSW provided community resources.    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)  Expected Discharge Plan and Services In-house Referral: Clinical Social Work     Living arrangements for the past 2 months: Homeless                                       Social Determinants of Health (SDOH) Interventions SDOH Screenings   Food Insecurity: Food Insecurity Present (09/21/2023)  Housing: High Risk (09/21/2023)  Transportation Needs: Unmet Transportation Needs (09/21/2023)  Utilities: Patient Declined (09/21/2023)  Alcohol  Screen: Medium Risk (11/25/2020)  Depression (PHQ2-9): Low Risk  (11/06/2019)  Social Connections: Socially Isolated (07/20/2023)  Tobacco Use: High Risk (09/20/2023)    Readmission Risk Interventions     No data to display

## 2023-09-26 NOTE — Progress Notes (Signed)
 Orthopedic Tech Progress Note Patient Details:  Jake Nguyen Jan 20, 1966 [id number]  Patient ID: Jake Nguyen, male   DOB: Mar 03, 1966, 58 y.o.   MRN: [id number] Called in order to [employer] for BKA shrinker. Edmonia Gottron 09/26/2023, 8:59 AM

## 2023-09-26 NOTE — Progress Notes (Addendum)
 Patient ID: Jake Nguyen, male   DOB: [birth year], 58 y.o.   MRN: [id number] Patient is a 58 year old gentleman who is 5 weeks status post right below-knee amputation.  The residual limb is consolidating well.  Examination the incision is healing well.  Order placed for patient to be placed in a stump shrinker.  I will follow-up in the office.

## 2023-09-26 NOTE — Progress Notes (Signed)
 PROGRESS NOTE    Jake Nguyen  ZOX:[id number] DOB: November 25, 1965 DOA: 09/20/2023 PCP: Trenton Frock, FNP    Chief Complaint  Patient presents with   Wound Infection    Brief Narrative:   Jake Nguyen is a 58 y.o. year old male with past medical history of EtOH abuse, depression, anxiety, (L) AKA, and (R) BKA.  She presents to [name] ED for evaluation of right stump infection and pressure injury to buttocks.  Jake Nguyen was recently admitted 08/11/2023 through 09/09/23 for chronic infection of his right lower leg.  He has had multiple recent admissions for the same including gangrene and maggot infestation of the wound.  Since to ED, for wound infection, as well noted to have withdrawals and admitted for further management,  - Of note patient is homeless and has limited ability to keep wounds clean with current social resources, as well as limited access to nutritious meals to support wound healing as such he has been recommended to go to SNF in the past but has unfortunately declined to go.   Assessment & Plan:   Principal Problem:   Cellulitis of right lower extremity Active Problems:   Sepsis (HCC)   Hypokalemia   Right BKA Wound infection   Alcohol  use disorder, severe, dependence (HCC)   Tobacco use   Hypomagnesemia   Pressure injury of buttock, stage 1   Malnutrition of moderate degree   Sepsis secondary to right lower extremity wound infection/cellulitis recent right BKA, with wound dehiscence -Sepsis present on admission, tachycardia, with lactic acidosis -  on IV vancomycin  and Rocephin  admission, narrowed to doxycycline  and cefazolin , now transition to p.o. Augmentin  and Doxy, to finish total of 7 days. - blood cultures, remains negative - Wound appears to be with some dehiscence as well, Dr. Julio Ohm consulted, no further recommendations other than to apply shrinkers and to follow as an outpatient.   Alcohol  abuse with withdrawal - Patient with significant  withdrawal on presentation, tachycardic, anxious, restless . - CIWA protocol with Librium  .  Has been tapered off - PT did with high-dose thiamine  .  He is on thiamine  100 mg oral daily. - Counseled on cessation   Hypokalemia  Hypomagnesemia  -  replaced aggressively yesterday, remains low, will replace further    PCM - albumin significantly low at 1.8, consuled nutritionist   Stage I pressure injury of right buttock, POA Stage I pressure injury of left buttock, POA - q2H turns and pressure offloading - Apply absorbent foam dressing to each buttock   Tobacco use disorder - NRT while inpatient with nicotine  patch and as needed Nicorette  gum - Counseled on cessation    DVT prophylaxis: lovenox  Code Status: Full Family Communication: none at ebdside Disposition:   Status is: Inpatient    Consultants:  Ortho   Subjective:  No significant events overnight, he denies any complaints today.  Objective: Vitals:   09/26/23 0431 09/26/23 0500 09/26/23 0756 09/26/23 1216  BP: 109/72  106/84 111/71  Pulse: 97  75   Resp: 18     Temp: 98.5 F (36.9 C)  98 F (36.7 C) 98.9 F (37.2 C)  TempSrc: Oral  Axillary Oral  SpO2:      Weight:  67 kg    Height:        Intake/Output Summary (Last 24 hours) at 09/26/2023 1452 Last data filed at 09/26/2023 1220 Gross per 24 hour  Intake 240 ml  Output 1125 ml  Net -885 ml  Filed Weights   09/22/23 0500 09/23/23 0500 09/26/23 0500  Weight: 67.8 kg 66.1 kg 67 kg    Examination:   Awake Alert, Oriented X 3, No new F.N deficits, Normal affect Symmetrical Chest wall movement, Good air movement bilaterally, CTAB RRR,No Gallops,Rubs or new Murmurs, No Parasternal Heave +ve B.Sounds, Abd Soft, No tenderness, No rebound - guarding or rigidity. Left KA, right BKA with wound bandaged    Data Reviewed: I have personally reviewed following labs and imaging studies  CBC: Recent Labs  Lab 09/20/23 2333 09/21/23 0428  09/22/23 0725 09/23/23 0728 09/24/23 0805 09/25/23 0544 09/26/23 0501  WBC 7.8 7.7 6.5 6.3 6.4 5.6 6.5  NEUTROABS 3.0 3.5  --   --   --   --   --   HGB 11.3* 10.1* 10.6* 11.1* 11.5* 11.1* 11.1*  HCT 34.0* 30.9* 32.6* 34.2* 35.8* 33.6* 34.8*  MCV 86.7 87.5 86.9 87.7 89.7 88.0 88.3  PLT 291 283 267 286 303 279 278    Basic Metabolic Panel: Recent Labs  Lab 09/22/23 0725 09/23/23 0728 09/24/23 0805 09/25/23 0544 09/26/23 0501  NA 136 137 139 133* 134*  K 3.2* 4.0 4.6 3.5 4.1  CL 99 104 101 100 103  CO2 27 26 29 24 23   GLUCOSE 106* 115* 118* 128* 110*  BUN <5* <5* <5* <5* 5*  CREATININE 0.50* 0.48* 0.58* 0.58* 0.54*  CALCIUM  7.6* 8.1* 8.8* 8.0* 8.4*  MG 1.4* 1.5* 1.3* 1.6* 1.5*  PHOS 3.6 3.7 4.3 4.1 4.3    GFR: Estimated Creatinine Clearance: 97.7 mL/min (A) (by C-G formula based on SCr of 0.54 mg/dL (L)).  Liver Function Tests: Recent Labs  Lab 09/21/23 0428  AST 20  ALT 12  ALKPHOS 57  BILITOT 0.2  PROT 6.3*  ALBUMIN 1.8*    CBG: No results for input(s): GLUCAP in the last 168 hours.   Recent Results (from the past 240 hours)  Culture, blood (single)     Status: None   Collection Time: 09/21/23 12:13 AM   Specimen: BLOOD LEFT HAND  Result Value Ref Range Status   Specimen Description BLOOD LEFT HAND  Final   Special Requests   Final    BOTTLES DRAWN AEROBIC AND ANAEROBIC Blood Culture adequate volume   Culture   Final    NO GROWTH 5 DAYS Performed at [facility], [address] [place], [place] [address]    Report Status 09/26/2023 FINAL  Final         Radiology Studies: No results found.       Scheduled Meds:  amoxicillin -clavulanate  1 tablet Oral Q12H   doxycycline   100 mg Oral Q12H   enoxaparin  (LOVENOX ) injection  40 mg Subcutaneous Q24H   folic acid   1 mg Oral Daily   multivitamin with minerals  1 tablet Oral Daily   nicotine   14 mg Transdermal Daily   nutrition supplement (JUVEN)  1 packet Oral BID BM    pantoprazole   40 mg Oral Daily   thiamine   100 mg Oral Daily   Or   thiamine   100 mg Intravenous Daily   Continuous Infusions:     LOS: 5 days       Seena Dadds, MD [facility]   To contact the attending provider between 7A-7P or the covering provider during after hours 7P-7A, please log into the web site [email or web address] and access using universal [place] password for that web site. If you do not have the password, please  call the hospital operator.  09/26/2023, 2:52 PM

## 2023-09-26 NOTE — Progress Notes (Signed)
 Change cefazolin  to Augmentin  to complete 7d with doxy per Dr. Osborne Blazer.  Ivery Marking, PharmD, BCIDP, AAHIVP, CPP Infectious Disease Pharmacist 09/26/2023 10:39 AM

## 2023-09-26 NOTE — Progress Notes (Signed)
 Occupational Therapy Treatment Patient Details Name: Jake Nguyen MRN: [id number] DOB: Dec 28, 1965 Today's Date: 09/26/2023   History of present illness Pt is a 58 y/o male admitted 6/11 due to sepsis in setting of cellulitis of R residual limb. Pending ortho consult. Also found to have hypokalemia, hypomagnesemia and stage 1 pressure injuries of buttocks. PMH: etoh use, tobacco use, anxiety, depression, L AKA, R BKA   OT comments  Pt in soiled bed. Came to EOB with supervision and cues to lift buttocks to avoid shearing. Assisted to change gown, wash face and hands at EOB and complete pericare once pt return to supine. Positioned in bed in sidelying with pillow to back for pressure relief. Nursing aware that pt's call button does not work properly.       If plan is discharge home, recommend the following:  Assist for transportation   Equipment Recommendations  Wheelchair cushion (measurements OT)    Recommendations for Other Services      Precautions / Restrictions Precautions Precautions: Fall Restrictions Weight Bearing Restrictions Per Provider Order: Yes RLE Weight Bearing Per Provider Order: Non weight bearing LLE Weight Bearing Per Provider Order: Non weight bearing       Mobility Bed Mobility Overal bed mobility: Needs Assistance Bed Mobility: Supine to Sit, Sit to Supine     Supine to sit: Supervision, Contact guard Sit to supine: Supervision   General bed mobility comments: pt cued to lift buttocks and avoid shearing of skin    Transfers Overall transfer level: Needs assistance                Lateral/Scoot Transfers: Contact guard assist General transfer comment: lateral scoot along EOB     Balance Overall balance assessment: Needs assistance   Sitting balance-Leahy Scale: Fair Sitting balance - Comments: stabilizing on foot board                                   ADL either performed or assessed with clinical judgement   ADL  Overall ADL's : Needs assistance/impaired     Grooming: Wash/dry hands;Wash/dry face;Sitting;Set up           Upper Body Dressing : Set up;Sitting           Toileting- Clothing Manipulation and Hygiene: Minimal assistance;Bed level              Extremity/Trunk Assessment              Vision       Perception     Praxis     Communication Communication Communication: No apparent difficulties   Cognition Arousal: Alert Behavior During Therapy: WFL for tasks assessed/performed Cognition: No apparent impairments             OT - Cognition Comments: likely at baseline                 Following commands: Intact        Cueing   Cueing Techniques: Verbal cues  Exercises      Shoulder Instructions       General Comments      Pertinent Vitals/ Pain       Pain Assessment Pain Assessment: Faces Faces Pain Scale: Hurts little more Pain Location: bottom, back Pain Descriptors / Indicators: Grimacing, Guarding, Sore Pain Intervention(s): Monitored during session, Repositioned  Home Living  Prior Functioning/Environment              Frequency  Min 1X/week        Progress Toward Goals  OT Goals(current goals can now be found in the care plan section)  Progress towards OT goals: Progressing toward goals  Acute Rehab OT Goals OT Goal Formulation: With patient Time For Goal Achievement: 10/06/23 Potential to Achieve Goals: Good  Plan      Co-evaluation                 AM-PAC OT 6 Clicks Daily Activity     Outcome Measure   Help from another person eating meals?: None Help from another person taking care of personal grooming?: A Little Help from another person toileting, which includes using toliet, bedpan, or urinal?: A Lot Help from another person bathing (including washing, rinsing, drying)?: A Little Help from another person to put on and taking off  regular upper body clothing?: A Little Help from another person to put on and taking off regular lower body clothing?: A Little 6 Click Score: 18    End of Session    OT Visit Diagnosis: Muscle weakness (generalized) (M62.81);Pain   Activity Tolerance     Patient Left     Nurse Communication          Time: 1610-9604 OT Time Calculation (min): 21 min  Charges: OT General Charges $OT Visit: 1 Visit OT Treatments $Self Care/Home Management : 8-22 mins  Avanell Leigh, OTR/L Acute Rehabilitation Services Office: [phone]   Jonette Nestle 09/26/2023, 2:13 PM

## 2023-09-26 NOTE — Plan of Care (Signed)
 Pt has rested quietly throughout the night with no distress noted. Alert and oriented. On room air. Pt is not  on tele monitor. Voids per urinal. No complaints voiced.     Problem: Education: Goal: Knowledge of General Education information will improve Description: Including pain rating scale, medication(s)/side effects and non-pharmacologic comfort measures Outcome: Progressing   Problem: Clinical Measurements: Goal: Respiratory complications will improve Outcome: Progressing Goal: Cardiovascular complication will be avoided Outcome: Progressing   Problem: Pain Managment: Goal: General experience of comfort will improve and/or be controlled Outcome: Progressing

## 2023-09-27 DIAGNOSIS — L03115 Cellulitis of right lower limb: Secondary | ICD-10-CM | POA: Diagnosis not present

## 2023-09-27 MED ORDER — MAGNESIUM SULFATE 2 GM/50ML IV SOLN
2.0000 g | Freq: Once | INTRAVENOUS | Status: AC
  Administered 2023-09-27: 2 g via INTRAVENOUS
  Filled 2023-09-27: qty 50

## 2023-09-27 NOTE — Progress Notes (Signed)
 PT Cancellation Note  Patient Details Name: Jake Nguyen MRN: [id number] DOB: Jul 17, 1965   Cancelled Treatment:    Reason Eval/Treat Not Completed: Patient declined, no reason specified (Pt on the phone with case managment. Requesting PT to come back later. Will follow up if time allows.)   Livia Tarr 09/27/2023, 2:29 PM

## 2023-09-27 NOTE — Progress Notes (Signed)
 PROGRESS NOTE    Jake Nguyen  UJW:[id number] DOB: 1965-05-30 DOA: 09/20/2023 PCP: Jake Frock, FNP    Chief Complaint  Patient presents with   Wound Infection    Brief Narrative:   Jake Nguyen is a 58 y.o. year old male with past medical history of EtOH abuse, depression, anxiety, (L) AKA, and (R) BKA.  She presents to [name] ED for evaluation of right stump infection and pressure injury to buttocks.  Jake Nguyen was recently admitted 08/11/2023 through 09/09/23 for chronic infection of his right lower leg.  He has had multiple recent admissions for the same including gangrene and maggot infestation of the wound.  Since to ED, for wound infection, as well noted to have withdrawals and admitted for further management,  - Of note patient is homeless and has limited ability to keep wounds clean with current social resources, as well as limited access to nutritious meals to support wound healing as such he has been recommended to go to SNF in the past but has unfortunately declined to go.   Assessment & Plan:   Principal Problem:   Cellulitis of right lower extremity Active Problems:   Sepsis (HCC)   Hypokalemia   Right BKA Wound infection   Alcohol  use disorder, severe, dependence (HCC)   Tobacco use   Hypomagnesemia   Pressure injury of buttock, stage 1   Malnutrition of moderate degree   Sepsis secondary to right lower extremity wound infection/cellulitis recent right BKA, with wound dehiscence - Sepsis present on admission, tachycardia/tachypnea with lactic acidosis -  on IV vancomycin  and Rocephin  admission, narrowed to doxycycline  and cefazolin , now transition to p.o. Augmentin  and Doxy - completed 09/27/23 - blood cultures, remains negative - Wound appears to be with some dehiscence as well, Dr. Julio Ohm consulted, no further recommendations other than to apply shrinkers and to follow as an outpatient.   Alcohol  abuse with withdrawal, resolved - Patient with  significant withdrawal on presentation, tachycardic, anxious, restless . - CIWA protocol with Librium  .  Has been tapered off - PT did with high-dose thiamine  - now on 100 mg oral daily. - Counseled on cessation   Hypokalemia  Hypomagnesemia  -  Follow repeat labs - mg still low today  Protein caloric malnutrition, moderate to severe, POA - albumin significantly low at 1.8, consuled nutritionist -Continue to advance diet as tolerated   Stage I pressure injury of right buttock, POA Stage I pressure injury of left buttock, POA - q2H turns and pressure offloading - Apply absorbent foam dressing to each buttock   Tobacco use disorder - NRT while inpatient with nicotine  patch and as needed Nicorette  gum - Counseled on cessation  DVT prophylaxis: lovenox  Code Status: Full Family Communication: None present  Disposition: To be determined  Status is: Inpatient    Consultants:  Ortho   Subjective: No acute issues or events reported overnight, patient states he slept well otherwise denies nausea vomiting diarrhea constipation) or chest pain  Objective: Vitals:   09/26/23 1216 09/26/23 1610 09/26/23 2000 09/26/23 2205  BP: 111/71 98/70 102/74   Pulse:   99   Resp:   18   Temp: 98.9 F (37.2 C) 99 F (37.2 C)  98.7 F (37.1 C)  TempSrc: Oral Oral  Oral  SpO2:   100%   Weight:      Height:        Intake/Output Summary (Last 24 hours) at 09/27/2023 0754 Last data filed at 09/27/2023 0600 Gross per  24 hour  Intake 720 ml  Output 2125 ml  Net -1405 ml   Filed Weights   09/22/23 0500 09/23/23 0500 09/26/23 0500  Weight: 67.8 kg 66.1 kg 67 kg    Examination:   General:  Pleasantly resting in bed, No acute distress. HEENT:  Normocephalic atraumatic.  Sclerae nonicteric, noninjected.  Extraocular movements intact bilaterally. Neck:  Without mass or deformity.  Trachea is midline. Lungs:  Clear to auscultate bilaterally without rhonchi, wheeze, or rales. Heart:   Regular rate and rhythm.  Without murmurs, rubs, or gallops. Abdomen:  Soft, nontender, nondistended.  Without guarding or rebound. Extremities: Right BKA, Left AKA  Data Reviewed: I have personally reviewed following labs and imaging studies  CBC: Recent Labs  Lab 09/20/23 2333 09/21/23 0428 09/22/23 0725 09/23/23 0728 09/24/23 0805 09/25/23 0544 09/26/23 0501  WBC 7.8 7.7 6.5 6.3 6.4 5.6 6.5  NEUTROABS 3.0 3.5  --   --   --   --   --   HGB 11.3* 10.1* 10.6* 11.1* 11.5* 11.1* 11.1*  HCT 34.0* 30.9* 32.6* 34.2* 35.8* 33.6* 34.8*  MCV 86.7 87.5 86.9 87.7 89.7 88.0 88.3  PLT 291 283 267 286 303 279 278    Basic Metabolic Panel: Recent Labs  Lab 09/22/23 0725 09/23/23 0728 09/24/23 0805 09/25/23 0544 09/26/23 0501  NA 136 137 139 133* 134*  K 3.2* 4.0 4.6 3.5 4.1  CL 99 104 101 100 103  CO2 27 26 29 24 23   GLUCOSE 106* 115* 118* 128* 110*  BUN <5* <5* <5* <5* 5*  CREATININE 0.50* 0.48* 0.58* 0.58* 0.54*  CALCIUM  7.6* 8.1* 8.8* 8.0* 8.4*  MG 1.4* 1.5* 1.3* 1.6* 1.5*  PHOS 3.6 3.7 4.3 4.1 4.3    GFR: Estimated Creatinine Clearance: 97.7 mL/min (A) (by C-G formula based on SCr of 0.54 mg/dL (L)).  Liver Function Tests: Recent Labs  Lab 09/21/23 0428  AST 20  ALT 12  ALKPHOS 57  BILITOT 0.2  PROT 6.3*  ALBUMIN 1.8*    CBG: No results for input(s): GLUCAP in the last 168 hours.   Recent Results (from the past 240 hours)  Culture, blood (single)     Status: None   Collection Time: 09/21/23 12:13 AM   Specimen: BLOOD LEFT HAND  Result Value Ref Range Status   Specimen Description BLOOD LEFT HAND  Final   Special Requests   Final    BOTTLES DRAWN AEROBIC AND ANAEROBIC Blood Culture adequate volume   Culture   Final    NO GROWTH 5 DAYS Performed at [facility], [address] [place], [place] [address]    Report Status 09/26/2023 FINAL  Final         Radiology Studies: No results found.       Scheduled Meds:   amoxicillin -clavulanate  1 tablet Oral Q12H   doxycycline   100 mg Oral Q12H   enoxaparin  (LOVENOX ) injection  40 mg Subcutaneous Q24H   folic acid   1 mg Oral Daily   multivitamin with minerals  1 tablet Oral Daily   nicotine   14 mg Transdermal Daily   nutrition supplement (JUVEN)  1 packet Oral BID BM   pantoprazole   40 mg Oral Daily   thiamine   100 mg Oral Daily   Or   thiamine   100 mg Intravenous Daily   Continuous Infusions:     LOS: 6 days       Haydee Lipa, DO [facility]   To contact the  attending provider between 7A-7P or the covering provider during after hours 7P-7A, please log into the web site [email or web address] and access using universal [place] password for that web site. If you do not have the password, please call the hospital operator.  09/27/2023, 7:54 AM

## 2023-09-27 NOTE — TOC Progression Note (Addendum)
 Transition of Care [facility]) - Progression Note    Patient Details  Name: Jake Nguyen MRN: [id number] Date of Birth: 07/06/1965  [facility] CM/SW Contact  Jannice Mends, LCSW Phone Number: 09/27/2023, 8:39 AM  Clinical Narrative:    8:39am-CSW received email from [facility] [phone]) with [facility] stated she is calling to assess patient today at 2pm. CSW securely sent her requested Fl2 and past psych notes to [email or web address].  9:02 AM-Trina stated the current documentation for patient's Anxiety/Depression is not sufficient and is requesting a psych consult to assess as a mental health diagnosis is the only way to qualify for their program. CSW will confer with MD.     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)  Expected Discharge Plan and Services In-house Referral: Clinical Social Work     Living arrangements for the past 2 months: Homeless                                       Social Determinants of Health (SDOH) Interventions SDOH Screenings   Food Insecurity: Food Insecurity Present (09/21/2023)  Housing: High Risk (09/21/2023)  Transportation Needs: Unmet Transportation Needs (09/21/2023)  Utilities: Patient Declined (09/21/2023)  Alcohol  Screen: Medium Risk (11/25/2020)  Depression (PHQ2-9): Low Risk  (11/06/2019)  Social Connections: Socially Isolated (07/20/2023)  Tobacco Use: High Risk (09/20/2023)    Readmission Risk Interventions     No data to display

## 2023-09-27 NOTE — Plan of Care (Signed)
 Pt has rested quietly throughout the night with no distress noted. Alert and oriented. On room air. SR on the monitor. Voids per urinals. No complaints voiced.     Problem: Education: Goal: Knowledge of General Education information will improve Description: Including pain rating scale, medication(s)/side effects and non-pharmacologic comfort measures Outcome: Progressing   Problem: Clinical Measurements: Goal: Respiratory complications will improve Outcome: Progressing Goal: Cardiovascular complication will be avoided Outcome: Progressing   Problem: Nutrition: Goal: Adequate nutrition will be maintained Outcome: Progressing   Problem: Coping: Goal: Level of anxiety will decrease Outcome: Progressing   Problem: Pain Managment: Goal: General experience of comfort will improve and/or be controlled Outcome: Progressing

## 2023-09-28 DIAGNOSIS — L03115 Cellulitis of right lower limb: Secondary | ICD-10-CM | POA: Diagnosis not present

## 2023-09-28 DIAGNOSIS — F1020 Alcohol dependence, uncomplicated: Secondary | ICD-10-CM | POA: Diagnosis not present

## 2023-09-28 MED ORDER — GABAPENTIN 100 MG PO CAPS
100.0000 mg | ORAL_CAPSULE | Freq: Three times a day (TID) | ORAL | Status: DC
  Administered 2023-09-28 – 2023-09-29 (×3): 100 mg via ORAL
  Filled 2023-09-28 (×3): qty 1

## 2023-09-28 NOTE — Plan of Care (Signed)

## 2023-09-28 NOTE — Progress Notes (Signed)
 Physical Therapy Treatment Patient Details Name: Jake Nguyen MRN: [id number] DOB: [birth year] Today's Date: 09/28/2023   History of Present Illness Pt is a 58 y/o male admitted 6/11 due to sepsis in setting of cellulitis of R residual limb. Pending ortho consult. Also found to have hypokalemia, hypomagnesemia and stage 1 pressure injuries of buttocks. PMH: etoh use, tobacco use, anxiety, depression, L AKA, R BKA    PT Comments  Tolerated treatment well. Focused on bed mobility, transfer training (Min assist with A/P transfer to/from [facility]), and LE exercises including prone exercises. Educated on repositioning schedule for pressure relief, BKA precautions, and shrinker use. Making good progress. Patient will continue to benefit from skilled physical therapy services to further improve independence with functional mobility. Patient will benefit from continued inpatient follow up therapy, <3 hours/day     If plan is discharge home, recommend the following: A lot of help with bathing/dressing/bathroom;Assist for transportation;Help with stairs or ramp for entrance;Assistance with cooking/housework;A little help with walking and/or transfers   Can travel by private vehicle     No  Equipment Recommendations  Other (comment) (ROHO Cushion for w/c)    Recommendations for Other Services Rehab consult     Precautions / Restrictions Precautions Precautions: Fall Recall of Precautions/Restrictions: Impaired Restrictions Weight Bearing Restrictions Per Provider Order: Yes LLE Weight Bearing Per Provider Order: Non weight bearing Other Position/Activity Restrictions: hx of L AKA     Mobility  Bed Mobility Overal bed mobility: Needs Assistance Bed Mobility: Supine to Sit, Sit to Supine Rolling: Supervision   Supine to sit: Contact guard Sit to supine: Supervision   General bed mobility comments: CGA for safety to rise, impaired balance towards posterior, cues for techniques to correct and  compensate. supervision to return to supine and roll into prone and supine again.    Transfers Overall transfer level: Needs assistance Equipment used: None Transfers: Bed to chair/wheelchair/BSC         Anterior-Posterior transfers: Min assist  Lateral/Scoot Transfers: Contact guard assist General transfer comment: Educated on transfers with anterior posterior scoot techniques. Does a good job of clearing buttocks to reduce friction with good UE strength. Min assist to steady and hold BSC in place while scooting, cues for technique and hand placement. Also able to perform lateral scoot at CGA level across EOB, again with good clearance of buttocks.    Ambulation/Gait               General Gait Details: unable   Stairs             Wheelchair Mobility     Tilt Bed    Modified Rankin (Stroke Patients Only)       Balance Overall balance assessment: Needs assistance Sitting-balance support: No upper extremity supported Sitting balance-Leahy Scale: Fair Sitting balance - Comments: Progressed to no UE support sitting upright in bed back unsupported.                                    Communication Communication Communication: No apparent difficulties  Cognition Arousal: Alert Behavior During Therapy: WFL for tasks assessed/performed   PT - Cognitive impairments: No apparent impairments                         Following commands: Intact      Cueing Cueing Techniques: Verbal cues  Exercises Amputee Exercises Quad Sets: AROM, Right,  5 reps, Supine, Strengthening Hip Extension: AROM, Right, 5 reps, Strengthening, Prone, Sidelying Hip ABduction/ADduction: AROM, Strengthening, Right, 5 reps, Sidelying Knee Flexion: AROM, Right, Strengthening, 5 reps (prone) Chair Push Up: Strengthening, Both, 5 reps, Seated    General Comments General comments (skin integrity, edema, etc.): Educated on repositioning schedule, 2-3 min pressure  relief every 30 min. And to spend time in prone as tolerated.      Pertinent Vitals/Pain Pain Assessment Pain Assessment: Faces Faces Pain Scale: Hurts a little bit Pain Location: bottom Pain Descriptors / Indicators: Guarding, Sore Pain Intervention(s): Limited activity within patient's tolerance, Premedicated before session, Repositioned    Home Living                          Prior Function            PT Goals (current goals can now be found in the care plan section) Acute Rehab PT Goals Patient Stated Goal: walk PT Goal Formulation: With patient Time For Goal Achievement: 10/06/23 Potential to Achieve Goals: Good Progress towards PT goals: Progressing toward goals    Frequency    Min 2X/week      PT Plan      Co-evaluation              AM-PAC PT 6 Clicks Mobility   Outcome Measure  Help needed turning from your back to your side while in a flat bed without using bedrails?: A Little Help needed moving from lying on your back to sitting on the side of a flat bed without using bedrails?: A Little Help needed moving to and from a bed to a chair (including a wheelchair)?: A Little Help needed standing up from a chair using your arms (e.g., wheelchair or bedside chair)?: Total Help needed to walk in hospital room?: Total Help needed climbing 3-5 steps with a railing? : Total 6 Click Score: 12    End of Session Equipment Utilized During Treatment: Gait belt Activity Tolerance: Patient tolerated treatment well Patient left: in bed;with call bell/phone within reach;with bed alarm set Nurse Communication: Mobility status (a/p transfers with +1 assist to [facility]) PT Visit Diagnosis: Muscle weakness (generalized) (M62.81);Difficulty in walking, not elsewhere classified (R26.2);Pain Pain - Right/Left: Right Pain - part of body:  (buttocks)     Time: 2595-6387 PT Time Calculation (min) (ACUTE ONLY): 13 min  Charges:    $Therapeutic Activity: 8-22  mins PT General Charges $$ ACUTE PT VISIT: 1 Visit                     Jory Ng, PT, DPT [facility]  Rehabilitation Services Physical Therapist Office: [phone] Website: [email or web address]    Alinda Irani 09/28/2023, 3:58 PM

## 2023-09-28 NOTE — Consult Note (Signed)
 [facility] Psychiatric Consult Initial  Patient Name: .Jake Nguyen  MRN: [id number]  DOB: 05/05/1965  Consult Order details:  Orders (From admission, onward)     Start     Ordered   09/28/23 1257  IP CONSULT TO PSYCHIATRY       Comments: *per request from SNF - to ensure his anxiety/depression are under control  Ordering Provider: Haydee Lipa, MD  Provider:  (Not yet assigned)  Question Answer Comment  Location [facility]   Reason for Consult? clearance for SNF      09/28/23 1256             Mode of Visit: In person    Psychiatry Consult Evaluation  Service Date: September 28, 2023 LOS:  LOS: 7 days  Chief Complaint I'm as happy as I can be.  Primary Psychiatric Diagnoses  Alcohol  dependence with uncomplicated w/drawal   Assessment  Jake Nguyen is a 58 y.o. male admitted: Medicallyfor 09/20/2023  8:19 PM with past medical history of EtOH abuse, depression, anxiety, (L) AKA, and (R) BKA.  She presents to [name] ED for evaluation of right stump infection and pressure injury to buttocks.  Jake Nguyen was recently admitted 08/11/2023 through 09/09/23 for chronic infection of his right lower leg.  He has had multiple recent admissions for the same including gangrene and maggot infestation of the wound.     His current presentation of excessive drinking, inability to stop despite it affecting his health, tolerance, and withdraw symptoms when not drinking is most consistent with alcohol  dependence.  Current outpatient psychotropic medications include none. On initial examination, patient was pleasant and denied anxiety yet voiced he does worry a lot of stuff on my mind and does have difficulty controlling the worry.  No panic attacks. Please see plan below for detailed recommendations.   Diagnoses:  Active Hospital problems: Principal Problem:   Cellulitis of right lower extremity Active Problems:   Alcohol  use disorder, severe, dependence  (HCC)   Sepsis (HCC)   Hypokalemia   Right BKA Wound infection   Tobacco use   Hypomagnesemia   Pressure injury of buttock, stage 1   Malnutrition of moderate degree    Plan   ## Psychiatric Medication Recommendations:  Alcohol  dependence: CIWA completed, no current withdrawal symptoms  Anxiety: -Gabapentin  100 mg TID started  ## Medical Decision Making Capacity: Not specifically addressed in this encounter  ## Further Work-up:  -- most recent EKG on 09/17/2023 had QtC of 462 -- Pertinent labwork reviewed earlier this admission includes: CBC, toxicology, BAL, chem panel   ## Disposition:-- There are no psychiatric contraindications to discharge at this time  ## Behavioral / Environmental: - No specific recommendations at this time.     ## Safety and Observation Level:  - Based on my clinical evaluation, I estimate the patient to be at low risk of self harm in the current setting. - At this time, we recommend  routine. This decision is based on my review of the chart including patient's history and current presentation, interview of the patient, mental status examination, and consideration of suicide risk including evaluating suicidal ideation, plan, intent, suicidal or self-harm behaviors, risk factors, and protective factors. This judgment is based on our ability to directly address suicide risk, implement suicide prevention strategies, and develop a safety plan while the patient is in the clinical setting. Please contact our team if there is a concern that risk level has changed.  CSSR Risk  Category:C-SSRS RISK CATEGORY: No Risk  Suicide Risk Assessment: Patient has following modifiable risk factors for suicide: none  Patient has following non-modifiable or demographic risk factors for suicide: male gender Patient has the following protective factors against suicide: Frustration tolerance and no history of suicide attempts  Thank you for this consult request. Recommendations  have been communicated to the primary team.  We will sign off at this time.   Roslynn Coombes, NP       History of Present Illness  Relevant Aspects of [facility] Course:  Admitted on 09/20/2023 with past medical history of EtOH abuse, depression, anxiety, (L) AKA, and (R) BKA.  She presents to [name] ED for evaluation of right stump infection and pressure injury to buttocks.  Jake Nguyen was recently admitted 08/11/2023 through 09/09/23 for chronic infection of his right lower leg.  He has had multiple recent admissions for the same including gangrene and maggot infestation of the wound.    Patient Report:  The client was watching television prior to the assessment.  He denied depression and stated he is as happy as I can be.  No suicidal ideations, hallucinations.  Initially denied anxiety and then stated I have a lot of stuff on my mind which he finds difficult to control at times, no panic attacks, moderate level.  He was homeless prior to admission with no support system.  Self-medicated with alcohol  of a 6 pack daily which is most likely more based on his BAL.  Denied current withdrawal symptoms or cravings, minimized his use.  He is agreeable to go to a facility and take anxiety medications.    Psych ROS:  Depression: none Anxiety:  moderate  Mania (lifetime and current): none Psychosis: (lifetime and current): none  Review of Systems  Psychiatric/Behavioral:  Positive for substance abuse. The patient is nervous/anxious.   All other systems reviewed and are negative.    Psychiatric and Social History  Psychiatric History:  Information collected from patient and chart.  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 Living Situation: facility  Access to weapons/lethal means: none   Substance History Alcohol : 6 pack daily of beer  Type of alcohol  beer Last Drink prior to admission Number of drinks per day 6 per client History of alcohol  withdrawal seizures denied History of DT's denied Tobacco: 10 cigarettes daily Illicit drugs: denied Prescription drug abuse: denied Rehab hx: past history  Exam Findings  Physical Exam: completed by the MD, reviewed by this practitioner Vital Signs:  Temp:  [97.4 F (36.3 C)] 97.4 F (36.3 C) (06/19 0817) Pulse Rate:  [86] 86 (06/19 0817) BP: (112)/(63) 112/63 (06/19 0817) SpO2:  [96 %] 96 % (06/19 0817) Weight:  [66.7 kg] 66.7 kg (06/19 0426) Blood pressure 112/63, pulse 86, temperature (!) 97.4 F (36.3 C), temperature source Oral, resp. rate 16, height 5' 10 (1.778 m), weight 66.7 kg, SpO2 96%. Body mass index is 21.1 kg/m.  Physical Exam Vitals and nursing note reviewed.  Constitutional:      Appearance: Normal appearance.  HENT:     Head: Normocephalic.     Nose: Nose normal.   Musculoskeletal:     Cervical back: Normal range of motion.   Neurological:  General: No focal deficit present.     Mental Status: He is alert and oriented to person, place, and time.     Mental Status Exam: General Appearance: Casual  Orientation:  Full (Time, Place, and Person)  Memory:  Immediate;   Good Recent;   Good Remote;   Good  Concentration:  Concentration: Good and Attention Span: Good  Recall:  Good  Attention  Good  Eye Contact:  Good  Speech:  Normal Rate  Language:  Good  Volume:  Normal  Mood: anxious  Affect:  Congruent  Thought Process:  Coherent  Thought Content:  Logical  Suicidal Thoughts:  No  Homicidal Thoughts:  No  Judgement:  Fair  Insight:  Fair  Psychomotor Activity:  Decreased  Akathisia:  No  Fund of Knowledge:  Fair      Assets:  Leisure Time Resilience   Cognition:  WNL  ADL's:  Impaired  AIMS (if indicated):        Other History   These have been pulled in through the EMR, reviewed, and updated if appropriate.  Family History:  The patient's Family history is unknown by patient.  Medical History: Past Medical History:  Diagnosis Date   Acute metabolic encephalopathy 11/14/2020   Alcohol  abuse with alcohol -induced mood disorder (HCC) 11/10/2019   Alcohol  withdrawal syndrome, with delirium (HCC) 11/06/2019   Alcoholism (HCC)    Anxiety    Depression    Influenza A 05/20/2018   Sepsis (HCC) 11/14/2020   Septic shock (HCC) 11/24/2020    Surgical History: Past Surgical History:  Procedure Laterality Date   ABDOMINAL AORTOGRAM W/LOWER EXTREMITY N/A 08/04/2020   Procedure: ABDOMINAL AORTOGRAM W/LOWER EXTREMITY;  Surgeon: Margherita Shell, MD;  Location: [facility] INVASIVE CV LAB;  Service: Cardiovascular;  Laterality: N/A;   AMPUTATION Left 08/05/2020   Procedure: AMPUTATION BELOW KNEE;  Surgeon: Margherita Shell, MD;  Location: [facility] OR;  Service: Vascular;  Laterality: Left;   AMPUTATION Left 11/21/2020   Procedure: REVISION LEFT ABOVE KNEE AMPUTATION;  Surgeon: Margherita Shell, MD;  Location: [facility] OR;  Service: Vascular;  Laterality: Left;   AMPUTATION Right 08/20/2023   Procedure: AMPUTATION BELOW KNEE;  Surgeon: Timothy Ford, MD;  Location: [facility] OR;  Service: Orthopedics;  Laterality: Right;   APPLICATION OF WOUND VAC Right 08/20/2023   Procedure: APPLICATION, WOUND VAC;  Surgeon: Timothy Ford, MD;  Location: [facility] OR;  Service: Orthopedics;  Laterality: Right;     Medications:   Current Facility-Administered Medications:    acetaminophen  (TYLENOL ) tablet 650 mg, 650 mg, Oral, Q6H PRN, 650 mg at 09/27/23 2152 **OR** acetaminophen  (TYLENOL ) suppository 650 mg, 650 mg, Rectal, Q6H PRN, Howerter, Justin B, DO   enoxaparin  (LOVENOX ) injection 40 mg, 40 mg, Subcutaneous, Q24H, Foust, Katy L, NP, 40 mg at 09/28/23 1001   fentaNYL  (SUBLIMAZE )  injection 25 mcg, 25 mcg, Intravenous, Q2H PRN, Howerter, Justin B, DO   folic acid  (FOLVITE ) tablet 1 mg, 1 mg, Oral, Daily, Foust, Katy L, NP, 1 mg at 09/28/23 1002   melatonin tablet 3 mg, 3 mg, Oral, QHS PRN, Howerter, Justin B, DO, 3 mg at 09/22/23 2157   multivitamin with minerals tablet 1 tablet, 1 tablet, Oral, Daily, Foust, Katy L, NP, 1 tablet at 09/28/23 1002   naloxone  (NARCAN ) injection 0.4 mg, 0.4 mg, Intravenous, PRN, Howerter, Justin B, DO   nicotine  (NICODERM CQ  - dosed in mg/24 hours) patch 14 mg, 14 mg, Transdermal, Daily, Foust, Katy L, NP   nicotine   polacrilex (NICORETTE ) gum 2 mg, 2 mg, Oral, PRN, Foust, Katy L, NP   nutrition supplement (JUVEN) (JUVEN) powder packet 1 packet, 1 packet, Oral, BID BM, Elgergawy, Dawood S, MD, 1 packet at 09/26/23 0827   ondansetron  (ZOFRAN ) injection 4 mg, 4 mg, Intravenous, Q6H PRN, Howerter, Justin B, DO   pantoprazole  (PROTONIX ) EC tablet 40 mg, 40 mg, Oral, Daily, Foust, Katy L, NP, 40 mg at 09/28/23 1002   polyethylene glycol (MIRALAX  / GLYCOLAX ) packet 17 g, 17 g, Oral, Daily PRN, Foust, Katy L, NP   thiamine  (VITAMIN B1) tablet 100 mg, 100 mg, Oral, Daily, 100 mg at 09/28/23 1002 **OR** thiamine  (VITAMIN B1) injection 100 mg, 100 mg, Intravenous, Daily, Elgergawy, Dawood S, MD   traZODone  (DESYREL ) tablet 50 mg, 50 mg, Oral, QHS PRN, Elgergawy, Dawood S, MD, 50 mg at 09/27/23 2152  Allergies: Allergies  Allergen Reactions   Pork-Derived Products Other (See Comments)    Pt states he doesn't eat pork    Roslynn Coombes, NP

## 2023-09-28 NOTE — Progress Notes (Signed)
 PROGRESS NOTE    LUCY WOOLEVER  WGN:[id number] DOB: 1966/03/15 DOA: 09/20/2023 PCP: Trenton Frock, FNP    Chief Complaint  Patient presents with   Wound Infection    Brief Narrative:   MARILYN WING is a 58 y.o. year old male with past medical history of EtOH abuse, depression, anxiety, (L) AKA, and (R) BKA.  She presents to [name] ED for evaluation of right stump infection and pressure injury to buttocks.  Mr. Cianci was recently admitted 08/11/2023 through 09/09/23 for chronic infection of his right lower leg.  He has had multiple recent admissions for the same including gangrene and maggot infestation of the wound.  Sent to hospital for wound infection, at risk for withdrawals and admitted for further management,   Patient is homeless and has limited ability to keep wounds clean with current social resources, as well as limited access to nutritious meals to support wound healing as such he has been recommended to go to SNF -awaiting approval at this time.  Facility is requesting a formal psychiatric evaluation given patient's distant history of anxiety and depression.  Otherwise he remains medically stable for discharge.   Assessment & Plan:   Principal Problem:   Cellulitis of right lower extremity Active Problems:   Sepsis (HCC)   Hypokalemia   Right BKA Wound infection   Alcohol  use disorder, severe, dependence (HCC)   Tobacco use   Hypomagnesemia   Pressure injury of buttock, stage 1   Malnutrition of moderate degree  Sepsis secondary to right lower extremity wound infection/cellulitis recent right BKA, with wound dehiscence, resolved - Sepsis present on admission, tachycardia/tachypnea with lactic acidosis -  on IV vancomycin  and Rocephin  admission, narrowed to doxycycline  and cefazolin , now transition to p.o. Augmentin  and Doxy - completed 09/27/23 - blood cultures, remains negative - Wound appears to be with some dehiscence as well, Dr. Julio Ohm consulted, no  further recommendations other than to apply shrinkers and to follow as an outpatient.   Alcohol  abuse with withdrawal, resolved - Patient with significant withdrawal on presentation, tachycardic, anxious, restless . - CIWA protocol with Librium  .  Has been tapered off - PT completed high-dose thiamine  - now on 100 mg oral daily. - Counseled on cessation   Hypokalemia  Hypomagnesemia  -  Follow repeat labs - mg still low today  Protein caloric malnutrition, moderate to severe, POA - albumin significantly low at 1.8, consuled nutritionist -Continue to advance diet as tolerated   Stage I pressure injury of right buttock, POA Stage I pressure injury of left buttock, POA - q2H turns and pressure offloading - Apply absorbent foam dressing to each buttock   Tobacco use disorder - NRT while inpatient with nicotine  patch and as needed Nicorette  gum - Counseled on cessation  DVT prophylaxis: lovenox  Code Status: Full Family Communication: None present  Disposition: SNF  Status is: Inpatient    Consultants:  Ortho   Subjective: No acute issues or events reported overnight, patient states he slept well otherwise denies nausea vomiting diarrhea constipation or chest pain  Objective: Vitals:   09/26/23 2205 09/27/23 0813 09/27/23 1201 09/28/23 0426  BP:  112/73 112/81   Pulse:  71 77   Resp:  16    Temp: 98.7 F (37.1 C) 98.1 F (36.7 C) 98.1 F (36.7 C)   TempSrc: Oral Axillary Oral   SpO2:      Weight:    66.7 kg  Height:        Intake/Output Summary (  Last 24 hours) at 09/28/2023 0720 Last data filed at 09/27/2023 2153 Gross per 24 hour  Intake 290 ml  Output 575 ml  Net -285 ml   Filed Weights   09/23/23 0500 09/26/23 0500 09/28/23 0426  Weight: 66.1 kg 67 kg 66.7 kg    Examination:   General:  Pleasantly resting in bed, No acute distress. HEENT:  Normocephalic atraumatic.  Sclerae nonicteric, noninjected.  Extraocular movements intact bilaterally. Neck:   Without mass or deformity.  Trachea is midline. Lungs:  Clear to auscultate bilaterally without rhonchi, wheeze, or rales. Heart:  Regular rate and rhythm.  Without murmurs, rubs, or gallops. Abdomen:  Soft, nontender, nondistended.  Without guarding or rebound. Extremities: Right BKA, Left AKA  Data Reviewed: I have personally reviewed following labs and imaging studies  CBC: Recent Labs  Lab 09/22/23 0725 09/23/23 0728 09/24/23 0805 09/25/23 0544 09/26/23 0501  WBC 6.5 6.3 6.4 5.6 6.5  HGB 10.6* 11.1* 11.5* 11.1* 11.1*  HCT 32.6* 34.2* 35.8* 33.6* 34.8*  MCV 86.9 87.7 89.7 88.0 88.3  PLT 267 286 303 279 278    Basic Metabolic Panel: Recent Labs  Lab 09/22/23 0725 09/23/23 0728 09/24/23 0805 09/25/23 0544 09/26/23 0501  NA 136 137 139 133* 134*  K 3.2* 4.0 4.6 3.5 4.1  CL 99 104 101 100 103  CO2 27 26 29 24 23   GLUCOSE 106* 115* 118* 128* 110*  BUN <5* <5* <5* <5* 5*  CREATININE 0.50* 0.48* 0.58* 0.58* 0.54*  CALCIUM  7.6* 8.1* 8.8* 8.0* 8.4*  MG 1.4* 1.5* 1.3* 1.6* 1.5*  PHOS 3.6 3.7 4.3 4.1 4.3    GFR: Estimated Creatinine Clearance: 97.3 mL/min (A) (by C-G formula based on SCr of 0.54 mg/dL (L)).  Liver Function Tests: No results for input(s): AST, ALT, ALKPHOS, BILITOT, PROT, ALBUMIN in the last 168 hours.   CBG: No results for input(s): GLUCAP in the last 168 hours.   Recent Results (from the past 240 hours)  Culture, blood (single)     Status: None   Collection Time: 09/21/23 12:13 AM   Specimen: BLOOD LEFT HAND  Result Value Ref Range Status   Specimen Description BLOOD LEFT HAND  Final   Special Requests   Final    BOTTLES DRAWN AEROBIC AND ANAEROBIC Blood Culture adequate volume   Culture   Final    NO GROWTH 5 DAYS Performed at [facility], [address], [place], [place] [address]    Report Status 09/26/2023 FINAL  Final         Radiology Studies: No results found.       Scheduled Meds:  enoxaparin   (LOVENOX ) injection  40 mg Subcutaneous Q24H   folic acid   1 mg Oral Daily   multivitamin with minerals  1 tablet Oral Daily   nicotine   14 mg Transdermal Daily   nutrition supplement (JUVEN)  1 packet Oral BID BM   pantoprazole   40 mg Oral Daily   thiamine   100 mg Oral Daily   Or   thiamine   100 mg Intravenous Daily   Continuous Infusions:     LOS: 7 days       Haydee Lipa, DO [facility]   To contact the attending provider between 7A-7P or the covering provider during after hours 7P-7A, please log into the web site [email or web address] and access using universal [place] password for that web site. If you do not have the password, please call the hospital operator.  09/28/2023, 7:20  AM

## 2023-09-28 NOTE — TOC Progression Note (Signed)
 Transition of Care [facility]) - Progression Note    Patient Details  Name: Jake Nguyen MRN: [id number] Date of Birth: 13-Sep-1965  Transition of Care [facility]) CM/SW Contact  Carmon Christen, LCSWA Phone Number: 09/28/2023, 3:05 PM  Clinical Narrative:     Psych eval pending. CSW will continue to follow and assist with patients dc planning needs.Doreen Gamma with [employer] informed previous CSW that the current documentation for patient's Anxiety/Depression is not sufficient and is requesting a psych consult to assess as a mental health diagnosis is the only way to qualify for their program.   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)  Expected Discharge Plan and Services In-house Referral: Clinical Social Work     Living arrangements for the past 2 months: Homeless                                       Social Determinants of Health (SDOH) Interventions SDOH Screenings   Food Insecurity: Food Insecurity Present (09/21/2023)  Housing: High Risk (09/21/2023)  Transportation Needs: Unmet Transportation Needs (09/21/2023)  Utilities: Patient Declined (09/21/2023)  Alcohol  Screen: Medium Risk (11/25/2020)  Depression (PHQ2-9): Low Risk  (11/06/2019)  Social Connections: Socially Isolated (07/20/2023)  Tobacco Use: High Risk (09/20/2023)    Readmission Risk Interventions     No data to display

## 2023-09-29 ENCOUNTER — Other Ambulatory Visit (HOSPITAL_COMMUNITY): Payer: Self-pay

## 2023-09-29 DIAGNOSIS — L03115 Cellulitis of right lower limb: Secondary | ICD-10-CM | POA: Diagnosis not present

## 2023-09-29 MED ORDER — ADULT MULTIVITAMIN W/MINERALS CH
1.0000 | ORAL_TABLET | Freq: Every day | ORAL | 0 refills | Status: DC
  Filled 2023-09-29: qty 30, 30d supply, fill #0

## 2023-09-29 MED ORDER — GABAPENTIN 100 MG PO CAPS
100.0000 mg | ORAL_CAPSULE | Freq: Three times a day (TID) | ORAL | 0 refills | Status: DC
  Filled 2023-09-29: qty 90, 30d supply, fill #0

## 2023-09-29 MED ORDER — PANTOPRAZOLE SODIUM 40 MG PO TBEC
40.0000 mg | DELAYED_RELEASE_TABLET | Freq: Every day | ORAL | 0 refills | Status: DC
  Filled 2023-09-29: qty 30, 30d supply, fill #0

## 2023-09-29 MED ORDER — THIAMINE HCL 100 MG PO TABS
100.0000 mg | ORAL_TABLET | Freq: Every day | ORAL | 0 refills | Status: DC
  Filled 2023-09-29: qty 30, 30d supply, fill #0

## 2023-09-29 MED ORDER — FOLIC ACID 1 MG PO TABS
1.0000 mg | ORAL_TABLET | Freq: Every day | ORAL | 0 refills | Status: DC
  Filled 2023-09-29: qty 30, 30d supply, fill #0

## 2023-09-29 MED ORDER — ATORVASTATIN CALCIUM 40 MG PO TABS
40.0000 mg | ORAL_TABLET | Freq: Every day | ORAL | 0 refills | Status: DC
Start: 2023-09-29 — End: 2023-12-12
  Filled 2023-09-29: qty 30, 30d supply, fill #0

## 2023-09-29 NOTE — Discharge Summary (Signed)
 Physician Discharge Summary  Jake Nguyen:[id number] DOB: 08-Dec-1965 DOA: 09/20/2023  PCP: Trenton Frock, FNP  Admit date: 09/20/2023 Discharge date: 09/29/2023  Admitted From: Home Disposition: Home  Recommendations for Outpatient Follow-up:  Follow up with PCP in 1-2 weeks  Home Health: None Equipment/Devices: Wheelchair cushion  Discharge Condition: Stable CODE STATUS: Full Diet recommendation: Low-salt low-fat diet as tolerated  Brief/Interim Summary: Jake Nguyen is a 58 y.o. year old male with past medical history of EtOH abuse, depression, anxiety, (L) AKA, and (R) BKA.  She [facility] to [name] for evaluation of right stump infection and pressure injury to buttocks.  Jake Nguyen was recently admitted 08/11/2023 through 09/09/23 for chronic infection of his right lower leg.  He has had multiple recent admissions for the same including gangrene and maggot infestation of the wound.  Sent to hospital for wound infection, at risk for withdrawals and admitted for further management,    Patient is homeless and has limited ability to keep wounds clean with current social resources, as well as limited access to nutritious meals to support wound healing as such he has been recommended to go to SNF -despite psych evaluation it appears patient's acceptance to facility has been delayed again, as such he is requesting discharge home given his prolonged hospitalization.  Discharge Diagnoses:  Principal Problem:   Cellulitis of right lower extremity Active Problems:   Sepsis (HCC)   Hypokalemia   Right BKA Wound infection   Alcohol  use disorder, severe, dependence (HCC)   Tobacco use   Hypomagnesemia   Pressure injury of buttock, stage 1   Malnutrition of moderate degree    Discharge Instructions  Discharge Instructions     Call MD for:  difficulty breathing, headache or visual disturbances   Complete by: As directed    Call MD for:  extreme fatigue   Complete by:  As directed    Call MD for:  hives   Complete by: As directed    Call MD for:  persistant dizziness or light-headedness   Complete by: As directed    Call MD for:  persistant nausea and vomiting   Complete by: As directed    Call MD for:  severe uncontrolled pain   Complete by: As directed    Call MD for:  temperature >100.4   Complete by: As directed    Diet - low sodium heart healthy   Complete by: As directed    Increase activity slowly   Complete by: As directed    No wound care   Complete by: As directed       Allergies as of 09/29/2023       Reactions   Pork-derived Products Other (See Comments)   Pt states he doesn't eat pork        Medication List     STOP taking these medications    acetaminophen  325 MG tablet Commonly known as: TYLENOL    amoxicillin -clavulanate 875-125 MG tablet Commonly known as: AUGMENTIN    doxycycline  100 MG tablet Commonly known as: VIBRA -TABS   oxyCODONE  5 MG immediate release tablet Commonly known as: Oxy IR/ROXICODONE    triamcinolone  cream 0.1 % Commonly known as: KENALOG        TAKE these medications    atorvastatin  (LIPITOR) 40 MG tablet Take 1 tablet (40 mg total) by mouth daily.   folic acid  1 MG tablet Commonly known as: FOLVITE  Take 1 tablet (1 mg total) by mouth daily. Start taking on: September 30, 2023  gabapentin  100 MG capsule Commonly known as: NEURONTIN  Take 1 capsule (100 mg total) by mouth 3 (three) times daily.   multivitamin with minerals Tabs tablet Take 1 tablet by mouth daily. Start taking on: September 30, 2023   pantoprazole  40 MG tablet Commonly known as: PROTONIX  Take 1 tablet (40 mg total) by mouth daily. Start taking on: September 30, 2023   thiamine  100 MG tablet Commonly known as: Vitamin B-1 Take 1 tablet (100 mg total) by mouth daily. Start taking on: September 30, 2023               Durable Medical Equipment  (From admission, onward)           Start     Ordered   09/29/23 1210   For home use only DME Other see comment  Once       Comments: [facility] CUSHION  Question:  Length of Need  Answer:  Lifetime   09/29/23 1209            Follow-up Information     Timothy Ford, MD Follow up in 1 week(s).   Specialty: Orthopedic Surgery Contact information: [address] [place]  [address] [place] [address] [phone]                Allergies  Allergen Reactions   Pork-Derived Products Other (See Comments)    Pt states he doesn't eat pork    Consultations: Orthopedic surgery, psychiatry  Procedures/Studies: DG Femur Min 2 Views Right Result Date: 09/20/2023 CLINICAL DATA:  Right leg infection pressure wound EXAM: RIGHT FEMUR 2 VIEWS COMPARISON:  09/03/2023 FINDINGS: Status post below the knee amputation. Smooth cut margins. No osseous destructive change. No soft tissue gas IMPRESSION: Status post below the knee amputation. No acute osseous abnormality Electronically Signed   By: Esmeralda Hedge M.D.   On: 09/20/2023 23:22   CT HEAD WO CONTRAST ( ) Result Date: 09/17/2023 CLINICAL DATA:  Marvell Slider on head from wheelchair. Abrasions on forehead. Intoxicated. Neck trauma, dangerous injury mechanism EXAM: CT HEAD WITHOUT CONTRAST CT CERVICAL SPINE WITHOUT CONTRAST TECHNIQUE: Multidetector CT imaging of the head and cervical spine was performed following the standard protocol without intravenous contrast. Multiplanar CT image reconstructions of the cervical spine were also generated. RADIATION DOSE REDUCTION: This exam was performed according to the departmental dose-optimization program which includes automated exposure control, adjustment of the mA and/or kV according to patient size and/or use of iterative reconstruction technique. COMPARISON:  CT cervical spine 08/02/2009 and CT head 03/19/2021 FINDINGS: CT HEAD FINDINGS Brain: No intracranial hemorrhage, mass effect, or evidence of acute infarct. No hydrocephalus. No extra-axial fluid collection. Mild cerebral atrophy.  Vascular: No hyperdense vessel. Intracranial arterial calcification. Skull: No fracture or focal lesion. Sinuses/Orbits: No acute finding. Other: None. CT CERVICAL SPINE FINDINGS Alignment: No evidence of traumatic malalignment. Skull base and vertebrae: No acute fracture. No primary bone lesion or focal pathologic process. Soft tissues and spinal canal: No prevertebral fluid or swelling. No visible canal hematoma. Disc levels: Intervertebral disc space height is maintained. No severe spinal canal narrowing. Upper chest: Emphysema. Other: None. IMPRESSION: 1. No acute intracranial abnormality. 2. No acute fracture in the cervical spine. Electronically Signed   By: Rozell Cornet M.D.   On: 09/17/2023 04:00   CT Cervical Spine Wo Contrast Result Date: 09/17/2023 CLINICAL DATA:  Marvell Slider on head from wheelchair. Abrasions on forehead. Intoxicated. Neck trauma, dangerous injury mechanism EXAM: CT HEAD WITHOUT CONTRAST CT CERVICAL SPINE WITHOUT CONTRAST TECHNIQUE: Multidetector CT imaging of  the head and cervical spine was performed following the standard protocol without intravenous contrast. Multiplanar CT image reconstructions of the cervical spine were also generated. RADIATION DOSE REDUCTION: This exam was performed according to the departmental dose-optimization program which includes automated exposure control, adjustment of the mA and/or kV according to patient size and/or use of iterative reconstruction technique. COMPARISON:  CT cervical spine 08/02/2009 and CT head 03/19/2021 FINDINGS: CT HEAD FINDINGS Brain: No intracranial hemorrhage, mass effect, or evidence of acute infarct. No hydrocephalus. No extra-axial fluid collection. Mild cerebral atrophy. Vascular: No hyperdense vessel. Intracranial arterial calcification. Skull: No fracture or focal lesion. Sinuses/Orbits: No acute finding. Other: None. CT CERVICAL SPINE FINDINGS Alignment: No evidence of traumatic malalignment. Skull base and vertebrae: No acute  fracture. No primary bone lesion or focal pathologic process. Soft tissues and spinal canal: No prevertebral fluid or swelling. No visible canal hematoma. Disc levels: Intervertebral disc space height is maintained. No severe spinal canal narrowing. Upper chest: Emphysema. Other: None. IMPRESSION: 1. No acute intracranial abnormality. 2. No acute fracture in the cervical spine. Electronically Signed   By: Rozell Cornet M.D.   On: 09/17/2023 04:00   CT KNEE RIGHT W CONTRAST Result Date: 09/05/2023 CLINICAL DATA:  Wound infection EXAM: CT OF THE RIGHT KNEE WITH CONTRAST TECHNIQUE: Multidetector CT imaging was performed following the standard protocol during bolus administration of intravenous contrast. RADIATION DOSE REDUCTION: This exam was performed according to the departmental dose-optimization program which includes automated exposure control, adjustment of the mA and/or kV according to patient size and/or use of iterative reconstruction technique. CONTRAST:  75mL OMNIPAQUE  IOHEXOL  350 MG/ML SOLN COMPARISON:  09/04/2023 FINDINGS: Bones/Joint/Cartilage Right below the knee amputation on 08/20/2023 with skin staples still in place. No bony destructive findings. Trace periostitis along the distal fibular margin not unexpected on postoperative day 16. Partial bipartite patella. No knee effusion. Ligaments Suboptimally assessed by CT. Muscles and Tendons Between the distal amputation margin of the tibia/fibula in the overlying wrap of muscle tissue we demonstrate a 6.4 by 2.1 by 4.2 cm (volume = 30 cm^3) fluid collection with enhancing margins. Internal fluid complex with internal density of 22 Hounsfield units. No internal gas. This appearance could reflect a postoperative fluid collection or abscess. The collection is shown for example on image 56 series 7 and image 59 series 8. Soft tissues Medial to the medial femoral epicondyle a homogeneous 2.7 by 1.3 by 2.8 cm subcutaneous lesion with internal density of  90 Hounsfield units is present, favoring a small complex cystic lesion. This could be a sebaceous cyst and does not appear thick-walled but is otherwise technically nonspecific. Correlate with chronicity of a lump at this site. IMPRESSION: 1. 30 cc fluid collection with enhancing margins between the distal bony amputation margin of the tibia/fibula and the overlying wrap of muscle tissue. This could reflect a postoperative fluid collection or abscess. 2. No bony destructive findings. Trace periostitis along the distal fibular margin not unexpected on postoperative day 16. 3. 2.7 by 1.3 by 2.8 cm subcutaneous lesion medial to the medial femoral epicondyle with internal density of 90 Hounsfield units, favoring a small complex cystic lesion. This could be a sebaceous cyst and does not appear thick-walled but is otherwise technically nonspecific. Correlate with chronicity of a lump at this site. Electronically Signed   By: Freida Jes M.D.   On: 09/05/2023 11:19   DG Knee 2 Views Right Result Date: 09/04/2023 CLINICAL DATA:  Status post right below the knee amputation 2 weeks  ago presenting with right knee pain and wound drainage. EXAM: RIGHT KNEE - 1-2 VIEW COMPARISON:  Sep 03, 2023 FINDINGS: There is evidence of prior right below the knee amputation. No evidence of an acute fracture, dislocation, or joint effusion. No evidence of arthropathy or other focal bone abnormality. Radiopaque skin staples are seen along the stump site. Diffuse soft tissue swelling is also seen within this region. IMPRESSION: 1. Evidence of prior right below the knee amputation with diffuse soft tissue swelling seen along the stump site. This may represent sequelae associated with diffuse cellulitis. 2. No acute osseous abnormality. Electronically Signed   By: Virgle Grime M.D.   On: 09/04/2023 02:36   DG Femur Min 2 Views Right Result Date: 09/03/2023 CLINICAL DATA:  Wound infection, evaluation for deep space infection EXAM:  RIGHT FEMUR 2 VIEWS COMPARISON:  Knee radiographs 06/14/2014 FINDINGS: No acute fracture or dislocation. No radiographic evidence of osteomyelitis. No evidence of soft tissue gas. IMPRESSION: No acute osseous abnormality Electronically Signed   By: Rozell Cornet M.D.   On: 09/03/2023 22:49     Subjective: No acute issues or events overnight   Discharge Exam: Vitals:   09/28/23 2015 09/29/23 0752  BP: 101/71 106/66  Pulse: 90   Resp:    Temp: 98 F (36.7 C) 97.7 F (36.5 C)  SpO2: 95% 100%   Vitals:   09/28/23 0817 09/28/23 1637 09/28/23 2015 09/29/23 0752  BP: 112/63 106/77 101/71 106/66  Pulse: 86 82 90   Resp:      Temp: (!) 97.4 F (36.3 C) 98.1 F (36.7 C) 98 F (36.7 C) 97.7 F (36.5 C)  TempSrc: Oral Oral Oral Oral  SpO2: 96% 96% 95% 100%  Weight:      Height:        General: Pt is alert, awake, not in acute distress Cardiovascular: RRR, S1/S2 +, no rubs, no gallops Respiratory: CTA bilaterally, no wheezing, no rhonchi Abdominal: Soft, NT, ND, bowel sounds + Extremities: Right BKA, left AKA    The results of significant diagnostics from this hospitalization (including imaging, microbiology, ancillary and laboratory) are listed below for reference.     Microbiology: Recent Results (from the past 240 hours)  Culture, blood (single)     Status: None   Collection Time: 09/21/23 12:13 AM   Specimen: BLOOD LEFT HAND  Result Value Ref Range Status   Specimen Description BLOOD LEFT HAND  Final   Special Requests   Final    BOTTLES DRAWN AEROBIC AND ANAEROBIC Blood Culture adequate volume   Culture   Final    NO GROWTH 5 DAYS Performed at [facility], [address] [place], [place] [address]    Report Status 09/26/2023 FINAL  Final     Labs: BNP (last 3 results) Recent Labs    08/21/23 0508 08/22/23 0426 08/23/23 0442  BNP 67.0 32.8 17.4   Basic Metabolic Panel: Recent Labs  Lab 09/23/23 0728 09/24/23 0805 09/25/23 0544 09/26/23 0501   NA 137 139 133* 134*  K 4.0 4.6 3.5 4.1  CL 104 101 100 103  CO2 26 29 24 23   GLUCOSE 115* 118* 128* 110*  BUN <5* <5* <5* 5*  CREATININE 0.48* 0.58* 0.58* 0.54*  CALCIUM  8.1* 8.8* 8.0* 8.4*  MG 1.5* 1.3* 1.6* 1.5*  PHOS 3.7 4.3 4.1 4.3   CBC: Recent Labs  Lab 09/23/23 0728 09/24/23 0805 09/25/23 0544 09/26/23 0501  WBC 6.3 6.4 5.6 6.5  HGB 11.1* 11.5* 11.1*  11.1*  HCT 34.2* 35.8* 33.6* 34.8*  MCV 87.7 89.7 88.0 88.3  PLT 286 303 279 278   Urinalysis    Component Value Date/Time   COLORURINE YELLOW 03/19/2021 0101   APPEARANCEUR CLEAR 03/19/2021 0101   LABSPEC 1.012 03/19/2021 0101   PHURINE 5.0 03/19/2021 0101   GLUCOSEU 50 (A) 03/19/2021 0101   HGBUR NEGATIVE 03/19/2021 0101   BILIRUBINUR NEGATIVE 03/19/2021 0101   KETONESUR 5 (A) 03/19/2021 0101   PROTEINUR NEGATIVE 03/19/2021 0101   UROBILINOGEN 0.2 08/02/2009 2144   NITRITE NEGATIVE 03/19/2021 0101   LEUKOCYTESUR NEGATIVE 03/19/2021 0101   Sepsis Labs Recent Labs  Lab 09/23/23 0728 09/24/23 0805 09/25/23 0544 09/26/23 0501  WBC 6.3 6.4 5.6 6.5   Microbiology Recent Results (from the past 240 hours)  Culture, blood (single)     Status: None   Collection Time: 09/21/23 12:13 AM   Specimen: BLOOD LEFT HAND  Result Value Ref Range Status   Specimen Description BLOOD LEFT HAND  Final   Special Requests   Final    BOTTLES DRAWN AEROBIC AND ANAEROBIC Blood Culture adequate volume   Culture   Final    NO GROWTH 5 DAYS Performed at [facility], [address], [place], [place] [address]    Report Status 09/26/2023 FINAL  Final     Time coordinating discharge: Over 30 minutes  SIGNED:   Haydee Lipa, DO [facility] 09/29/2023, 2:22 PM Pager   If 7PM-7AM, please contact night-coverage [email or web address]

## 2023-09-29 NOTE — TOC Transition Note (Signed)
 Transition of Care [facility]) - Discharge Note   Patient Details  Name: Jake Nguyen MRN: [id number] Date of Birth: [birth year]  Transition of [facility]) CM/SW Contact:  Eusebio High, RN Phone Number: 09/29/2023, 12:30 PM   Clinical Narrative:     Patient will be dcing today. Patient is homeless and will return to street. RNCM has provided bus pass per patient request.   Wheelchair cushion has been ordered and will be delivered bedside by [employer].   RNCM has contacted Chaneta Comer to follow up on resources available- This was put in motion previously by CSW.  Doreen Gamma stated she needed the psych eval note- RNCM has e mailed that note as well as the [facility] for patient.  Patient does not have a phone. RNCM spoke with Patients Sister, [name] who lives in [place] .  She gave permission for Doreen Gamma to have her number. Although Sister lives in [place] , there may be a way for her to reach her Brother. Of note, Sister stated she was here in February to assist her Brother. She arranged for as telephone then. Patient states someone stole it.  Patient also has Sisters phone number ([employer] by Sister) so he can keep in touch.  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)   Patient Goals and CMS Choice            Discharge Placement                       Discharge Plan and Services Additional resources added to the After Visit Summary for   In-house Referral: Clinical Social Work                                   Social Drivers of Health (SDOH) Interventions SDOH Screenings   Food Insecurity: Food Insecurity Present (09/21/2023)  Housing: High Risk (09/21/2023)  Transportation Needs: Unmet Transportation Needs (09/21/2023)  Utilities: Patient Declined (09/21/2023)  Alcohol  Screen: Medium Risk (11/25/2020)  Depression (PHQ2-9): Low Risk  (11/06/2019)  Social Connections:  Socially Isolated (07/20/2023)  Tobacco Use: High Risk (09/20/2023)     Readmission Risk Interventions     No data to display

## 2023-09-29 NOTE — Progress Notes (Signed)
 Nutrition Follow-up  DOCUMENTATION CODES:  Non-severe (moderate) malnutrition in context of social or environmental circumstances  INTERVENTION:  Snacks TID between meals  Juven BID, each packet provides 95 calories, 2.5 grams of protein (collagen), and 9.8 grams of carbohydrate (3 grams sugar); also contains 7 grams of L-arginine and L-glutamine, 300 mg vitamin C , 15 mg vitamin E, 1.2 mcg vitamin B-12, 9.5 mg zinc , 200 mg calcium , and 1.5 g  Calcium  Beta-hydroxy-Beta-methylbutyrate to support wound healing  Assistance with ordering meal trays Continue MVI w/ minerals Continue Thiamine  and folic acid  supplementation Consider scheduled bowel regimen   NUTRITION DIAGNOSIS:  Moderate Malnutrition related to social / environmental circumstances as evidenced by mild fat depletion, mild muscle depletion. - remains applicable  GOAL:  Patient will meet greater than or equal to 90% of their needs - progressing  MONITOR:  PO intake, Skin, Weight trends  REASON FOR ASSESSMENT:  Consult Assessment of nutrition requirement/status  ASSESSMENT:  Pt with PMH significant for: EtOH abuse, depression, anxiety admitted with sepsis 2/2 cellulitis of RLE. Of note, he is with bilateral BKAs and homeless.  Patient medically stable for discharge, however facility requesting formal psych eval d/t hx of anxiety and depression.   Average Meal Intake 6/17: 100% x1 documented meal 6/18: 100% x1 documented meal  6/19: 100% x1 documented meal   Intake desirable and stable. No issues with chewing or swallowing. Snacks TID continue. Patient accepting. Not amicable to ONS.  Diet remains liberalized. Refusing Juven. Will d/c due to patient preference, however reiterated importance of adequate protein, calories, and vitamins to support wound healing.   Admit Weight: 68.9 kg  Current Weight: 66.7kg   Weight stable with no edema documented. UOP adequate with 1.6L output yesterday. Bowels stable. Last documented  BM 6/16. May need more formal bowel regimen if frequency does not increase.    Intake/Output Summary (Last 24 hours) at 09/29/2023 0932 Last data filed at 09/28/2023 2000 Gross per 24 hour  Intake --  Output 1000 ml  Net -1000 ml     Has required magnesium  and potassium repletion. Continues on thiamine  and folic acid . No new labs to review.   Meds:  Folic acid  MVI w/ minerals Pantoprazole  Thiamine    Labs: No new labs to review  Diet Order:   Diet Order             Diet regular Room service appropriate? Yes with Assist; Fluid consistency: Thin  Diet effective now             EDUCATION NEEDS:  Education needs have been addressed  Skin:  Skin Assessment: Skin Integrity Issues: Skin Integrity Issues:: Stage II, Other (Comment) Stage II: R and L buttocks Other: dehiscence of old RLL BKA site  Last BM:  6/16  Height:  Ht Readings from Last 1 Encounters:  09/21/23 5' 10 (1.778 m)   Weight:  Wt Readings from Last 1 Encounters:  09/28/23 66.7 kg    BMI:  Body mass index is 21.1 kg/m.  Estimated Nutritional Needs:   Kcal:  1900-2100kcals  Protein:  100-120g  Fluid:  >1.9L/day  Con Decant MS, RD, LDN Registered Dietitian Clinical Nutrition RD Inpatient Contact Info in Amion

## 2023-10-06 ENCOUNTER — Ambulatory Visit (INDEPENDENT_AMBULATORY_CARE_PROVIDER_SITE_OTHER): Payer: MEDICAID | Admitting: Primary Care

## 2023-10-22 ENCOUNTER — Other Ambulatory Visit: Payer: Self-pay

## 2023-10-22 ENCOUNTER — Emergency Department (HOSPITAL_COMMUNITY)
Admission: EM | Admit: 2023-10-22 | Discharge: 2023-10-22 | Disposition: A | Discharge status: Home / Self Care | Payer: MEDICAID | Attending: Emergency Medicine | Admitting: Emergency Medicine

## 2023-10-22 ENCOUNTER — Encounter (HOSPITAL_COMMUNITY): Payer: Self-pay

## 2023-10-22 ENCOUNTER — Emergency Department (HOSPITAL_COMMUNITY): Payer: MEDICAID

## 2023-10-22 DIAGNOSIS — Y828 Other medical devices associated with adverse incidents: Secondary | ICD-10-CM | POA: Diagnosis not present

## 2023-10-22 DIAGNOSIS — W050XXA Fall from non-moving wheelchair, initial encounter: Secondary | ICD-10-CM | POA: Insufficient documentation

## 2023-10-22 DIAGNOSIS — S0990XA Unspecified injury of head, initial encounter: Secondary | ICD-10-CM | POA: Diagnosis present

## 2023-10-22 DIAGNOSIS — T8743 Infection of amputation stump, right lower extremity: Secondary | ICD-10-CM | POA: Diagnosis not present

## 2023-10-22 DIAGNOSIS — R7309 Other abnormal glucose: Secondary | ICD-10-CM | POA: Diagnosis not present

## 2023-10-22 DIAGNOSIS — S0081XA Abrasion of other part of head, initial encounter: Secondary | ICD-10-CM | POA: Diagnosis not present

## 2023-10-22 LAB — CBG MONITORING, ED: Glucose-Capillary: 104 mg/dL — ABNORMAL HIGH (ref 70–99)

## 2023-10-22 MED ORDER — OXYCODONE-ACETAMINOPHEN 5-325 MG PO TABS
1.0000 | ORAL_TABLET | Freq: Once | ORAL | Status: DC
  Filled 2023-10-22: qty 1

## 2023-10-22 NOTE — ED Provider Notes (Signed)
 Jake Nguyen Provider Note   CSN: [id number] Arrival date & time: 10/22/23  1312     Patient presents with: Alcohol  Intoxication   Jake Nguyen is a 58 y.o. male.   Pt is a 58 y.o. year old male with past medical history of EtOH abuse, depression, anxiety, (L) AKA, and (R) BKA multiple admissions in the last few months for ongoing infection of the right stump who is presenting today due to injuring his head when he fell out of his wheelchair earlier today.  He reports he lost his balance and fell forward hitting the right forehead.  He is complaining of a headache in person who is with the patient reports that he had positive LOC.  He denies any neck pain.  Does admit to drinking 2 40 ounce beers today.  He has not changed the sock on his right leg for over a week and his girlfriend who is present with him reports that it is still been draining.  Left stump is well-healed.  The history is provided by the patient.  Alcohol  Intoxication       Prior to Admission medications   Medication Sig Start Date End Date Taking? Authorizing Provider  atorvastatin  (LIPITOR) 40 MG tablet Take 1 tablet (40 mg total) by mouth daily. 09/29/23   Lue Elsie BROCKS, MD  folic acid  (FOLVITE ) 1 MG tablet Take 1 tablet (1 mg total) by mouth daily. 09/30/23   Lue Elsie BROCKS, MD  gabapentin  (NEURONTIN ) 100 MG capsule Take 1 capsule (100 mg total) by mouth 3 (three) times daily. 09/29/23   Lue Elsie BROCKS, MD  Multiple Vitamin (MULTIVITAMIN WITH MINERALS) TABS tablet Take 1 tablet by mouth daily. 09/30/23   Lue Elsie BROCKS, MD  pantoprazole  (PROTONIX ) 40 MG tablet Take 1 tablet (40 mg total) by mouth daily. 09/30/23   Lue Elsie BROCKS, MD  thiamine  (VITAMIN B1) 100 MG tablet Take 1 tablet (100 mg total) by mouth daily. 09/30/23   Lue Elsie BROCKS, MD    Allergies: Pork-derived products    Review of Systems  Updated Vital Signs BP 111/79  (BP Location: Right Arm)   Pulse (!) 102   Temp 97.6 F (36.4 C) (Oral)   Resp 14   Ht 5' 10 (1.778 m)   Wt 66.7 kg   SpO2 96%   BMI 21.10 kg/m   Physical Exam Vitals and nursing note reviewed.  Constitutional:      General: He is not in acute distress.    Appearance: He is well-developed.  HENT:     Head: Normocephalic and atraumatic.   Eyes:     Conjunctiva/sclera: Conjunctivae normal.     Pupils: Pupils are equal, round, and reactive to light.  Cardiovascular:     Rate and Rhythm: Normal rate and regular rhythm.     Heart sounds: No murmur heard. Pulmonary:     Effort: Pulmonary effort is normal. No respiratory distress.     Breath sounds: Normal breath sounds. No wheezing or rales.  Abdominal:     General: There is no distension.     Palpations: Abdomen is soft.     Tenderness: There is no abdominal tenderness. There is no guarding or rebound.  Musculoskeletal:        General: Normal range of motion.     Cervical back: Normal range of motion and neck supple.     Comments: Left AKA with well-healed stump, right BKA with sock that is  stuck on his skin and malodorous.  It was removed with evidence of ongoing chronic wound and suture still in place in the right BKA.  No significant erythema and significantly improved from prior photos.  Skin:    General: Skin is warm and dry.     Findings: No erythema or rash.  Neurological:     Mental Status: He is alert and oriented to person, place, and time. Mental status is at baseline.  Psychiatric:        Behavior: Behavior normal.     (all labs ordered are listed, but only abnormal results are displayed) Labs Reviewed  CBG MONITORING, ED - Abnormal; Notable for the following components:      Result Value   Glucose-Capillary 104 (*)    All other components within normal limits    EKG: EKG Interpretation Date/Time:  Sunday October 22 2023 13:20:07 EDT Ventricular Rate:  103 PR Interval:  173 QRS Duration:  94 QT  Interval:  357 QTC Calculation: 468 R Axis:   84  Text Interpretation: Sinus tachycardia Low voltage, extremity leads No significant change since last tracing Confirmed by Doretha Folks ([id number]) on 10/22/2023 1:53:55 PM  Radiology: No results found.   Procedures   Medications Ordered in the ED  oxyCODONE -acetaminophen  (PERCOCET/ROXICET) 5-325 MG per tablet 1 tablet (has no administration in time range)                                    Medical Decision Making Amount and/or Complexity of Data Reviewed Radiology: ordered.  Risk Prescription drug management.   Pt with multiple medical problems and comorbidities and presenting today with a complaint that caries a high risk for morbidity and mortality.  Patient here with the above complaints with significant social barriers as patient is unhoused, poor nutrition and an ongoing chronic wound but here today because of an injury to his head after falling out of his wheelchair around 7 AM.  He is complaining of a headache but does not have other specific complaints.  Patient does have an ongoing wound with still some mild opening in the center of the wound but significantly improved from 11 June.  She denies any systemic symptoms of infection.  Will provide clean stocking and wound care.  Sutures were removed.  Patient does not take any anticoagulation.  CT is pending      Final diagnoses:  None    ED Discharge Orders     None          Doretha Folks, MD 10/22/23 1514

## 2023-10-22 NOTE — ED Triage Notes (Signed)
 Pt to ED via EMS with c/o alcohol  intoxication. Per EMS, pt fell out of wheelchair earlier today and hit head. +LOC. -blood thinners. Pt drank two 40oz beers today. Pt given 500ml NS en route. Pt arrives in c-collar. Pt arrives with abrasion to right forehead, covered with bandaid. No uncontrolled bleeding noted. Pt A&Ox4.

## 2023-10-22 NOTE — Discharge Instructions (Signed)
 Your workup today was reassuring.  Please follow-up with your doctor and return to the ER for worsening symptoms.

## 2023-10-22 NOTE — ED Notes (Signed)
 Pt to CT

## 2023-10-22 NOTE — ED Provider Notes (Signed)
 I was asked to follow-up on the patient's CT scan after a fall from his wheelchair.  The patient is in his normal state of health otherwise other than drinking some alcohol  today but is clinically sober.  Plan is for discharge if CT scan unremarkable.  Physical Exam  BP (!) 95/58   Pulse (!) 101   Temp 97.6 F (36.4 C) (Oral)   Resp 18   Ht 5' 10 (1.778 m)   Wt 66.7 kg   SpO2 99%   BMI 21.10 kg/m   Physical Exam : No acute distress Procedures  Procedures  ED Course / MDM    Medical Decision Making Amount and/or Complexity of Data Reviewed Radiology: ordered.  Risk Prescription drug management.   CT scan is unremarkable.  The patient is discharged with return precautions.       Ula Prentice SAUNDERS, MD 10/22/23 [phone]

## 2023-10-27 ENCOUNTER — Emergency Department (HOSPITAL_COMMUNITY): Payer: MEDICAID

## 2023-10-27 ENCOUNTER — Inpatient Hospital Stay (HOSPITAL_COMMUNITY)
Admission: EM | Admit: 2023-10-27 | Discharge: 2023-11-06 | DRG: 564 | Disposition: A | Discharge status: Home / Self Care | Payer: MEDICAID | Attending: Family Medicine | Admitting: Family Medicine

## 2023-10-27 ENCOUNTER — Other Ambulatory Visit: Payer: Self-pay

## 2023-10-27 DIAGNOSIS — L89322 Pressure ulcer of left buttock, stage 2: Secondary | ICD-10-CM | POA: Diagnosis present

## 2023-10-27 DIAGNOSIS — T8743 Infection of amputation stump, right lower extremity: Principal | ICD-10-CM | POA: Diagnosis present

## 2023-10-27 DIAGNOSIS — F10220 Alcohol dependence with intoxication, uncomplicated: Secondary | ICD-10-CM | POA: Diagnosis present

## 2023-10-27 DIAGNOSIS — F1020 Alcohol dependence, uncomplicated: Secondary | ICD-10-CM | POA: Diagnosis present

## 2023-10-27 DIAGNOSIS — D638 Anemia in other chronic diseases classified elsewhere: Secondary | ICD-10-CM | POA: Diagnosis present

## 2023-10-27 DIAGNOSIS — Z79899 Other long term (current) drug therapy: Secondary | ICD-10-CM

## 2023-10-27 DIAGNOSIS — Y835 Amputation of limb(s) as the cause of abnormal reaction of the patient, or of later complication, without mention of misadventure at the time of the procedure: Secondary | ICD-10-CM | POA: Diagnosis present

## 2023-10-27 DIAGNOSIS — Y908 Blood alcohol level of 240 mg/100 ml or more: Secondary | ICD-10-CM | POA: Diagnosis present

## 2023-10-27 DIAGNOSIS — Z1152 Encounter for screening for COVID-19: Secondary | ICD-10-CM

## 2023-10-27 DIAGNOSIS — T148XXA Other injury of unspecified body region, initial encounter: Secondary | ICD-10-CM | POA: Diagnosis not present

## 2023-10-27 DIAGNOSIS — J189 Pneumonia, unspecified organism: Secondary | ICD-10-CM

## 2023-10-27 DIAGNOSIS — F10920 Alcohol use, unspecified with intoxication, uncomplicated: Secondary | ICD-10-CM

## 2023-10-27 DIAGNOSIS — Z5900 Homelessness unspecified: Secondary | ICD-10-CM

## 2023-10-27 DIAGNOSIS — G928 Other toxic encephalopathy: Secondary | ICD-10-CM | POA: Diagnosis present

## 2023-10-27 DIAGNOSIS — Z91014 Allergy to mammalian meats: Secondary | ICD-10-CM

## 2023-10-27 DIAGNOSIS — E876 Hypokalemia: Secondary | ICD-10-CM | POA: Diagnosis present

## 2023-10-27 DIAGNOSIS — A419 Sepsis, unspecified organism: Principal | ICD-10-CM

## 2023-10-27 DIAGNOSIS — L089 Local infection of the skin and subcutaneous tissue, unspecified: Secondary | ICD-10-CM | POA: Diagnosis present

## 2023-10-27 DIAGNOSIS — Z89612 Acquired absence of left leg above knee: Secondary | ICD-10-CM

## 2023-10-27 DIAGNOSIS — E871 Hypo-osmolality and hyponatremia: Secondary | ICD-10-CM | POA: Diagnosis not present

## 2023-10-27 DIAGNOSIS — D696 Thrombocytopenia, unspecified: Secondary | ICD-10-CM | POA: Diagnosis present

## 2023-10-27 LAB — CBC WITH DIFFERENTIAL/PLATELET
Abs Immature Granulocytes: 0.01 K/uL (ref 0.00–0.07)
Basophils Absolute: 0.1 K/uL (ref 0.0–0.1)
Basophils Relative: 1 %
Eosinophils Absolute: 0.4 K/uL (ref 0.0–0.5)
Eosinophils Relative: 8 %
HCT: 42 % (ref 39.0–52.0)
Hemoglobin: 13.4 g/dL (ref 13.0–17.0)
Immature Granulocytes: 0 %
Lymphocytes Relative: 61 %
Lymphs Abs: 3.2 K/uL (ref 0.7–4.0)
MCH: 27.7 pg (ref 26.0–34.0)
MCHC: 31.9 g/dL (ref 30.0–36.0)
MCV: 86.8 fL (ref 80.0–100.0)
Monocytes Absolute: 0.4 K/uL (ref 0.1–1.0)
Monocytes Relative: 7 %
Neutro Abs: 1.2 K/uL — ABNORMAL LOW (ref 1.7–7.7)
Neutrophils Relative %: 23 %
Platelets: 135 K/uL — ABNORMAL LOW (ref 150–400)
RBC: 4.84 MIL/uL (ref 4.22–5.81)
RDW: 15.1 % (ref 11.5–15.5)
WBC: 5.3 K/uL (ref 4.0–10.5)
nRBC: 0 % (ref 0.0–0.2)

## 2023-10-27 LAB — COMPREHENSIVE METABOLIC PANEL WITH GFR
ALT: 10 U/L (ref 0–44)
AST: 24 U/L (ref 15–41)
Albumin: 3 g/dL — ABNORMAL LOW (ref 3.5–5.0)
Alkaline Phosphatase: 86 U/L (ref 38–126)
Anion gap: 14 (ref 5–15)
BUN: <5 mg/dL — ABNORMAL LOW (ref 6–20)
CO2: 26 mmol/L (ref 22–32)
Calcium: 7.8 mg/dL — ABNORMAL LOW (ref 8.9–10.3)
Chloride: 100 mmol/L (ref 98–111)
Creatinine, Ser: 0.47 mg/dL — ABNORMAL LOW (ref 0.61–1.24)
GFR, Estimated: >60 mL/min (ref >60)
Glucose, Bld: 111 mg/dL — ABNORMAL HIGH (ref 70–99)
Potassium: 3.3 mmol/L — ABNORMAL LOW (ref 3.5–5.1)
Sodium: 140 mmol/L (ref 135–145)
Total Bilirubin: 0.5 mg/dL (ref 0.0–1.2)
Total Protein: 8.3 g/dL — ABNORMAL HIGH (ref 6.5–8.1)

## 2023-10-27 LAB — PROTIME-INR
INR: 0.9 (ref 0.8–1.2)
Prothrombin Time: 12.6 s (ref 11.4–15.2)

## 2023-10-27 LAB — ETHANOL: Alcohol, Ethyl (B): 460 mg/dL (ref <15)

## 2023-10-27 LAB — I-STAT CG4 LACTIC ACID, ED: Lactic Acid, Venous: 1.9 mmol/L (ref 0.5–1.9)

## 2023-10-27 MED ORDER — SODIUM CHLORIDE 0.9 % IV SOLN
500.0000 mg | Freq: Once | INTRAVENOUS | Status: AC
  Administered 2023-10-28: 500 mg via INTRAVENOUS
  Filled 2023-10-27: qty 5

## 2023-10-27 MED ORDER — LACTATED RINGERS IV BOLUS (SEPSIS): 1000.0000 mL | Freq: Once | INTRAVENOUS | Status: DC

## 2023-10-27 MED ORDER — LACTATED RINGERS IV SOLN: INTRAVENOUS | Status: AC

## 2023-10-27 MED ORDER — SODIUM CHLORIDE 0.9 % IV SOLN
2.0000 g | Freq: Once | INTRAVENOUS | Status: AC
  Administered 2023-10-27: 2 g via INTRAVENOUS
  Filled 2023-10-27: qty 20

## 2023-10-27 MED ORDER — VANCOMYCIN HCL IN DEXTROSE 1-5 GM/200ML-% IV SOLN
1000.0000 mg | Freq: Once | INTRAVENOUS | Status: AC
  Administered 2023-10-27: 1000 mg via INTRAVENOUS
  Filled 2023-10-27: qty 200

## 2023-10-27 NOTE — Sepsis Progress Note (Signed)
Elink monitoring for code sepsis 

## 2023-10-27 NOTE — ED Provider Notes (Addendum)
  EMERGENCY DEPARTMENT AT [facility] Provider Note   CSN: [id number] Arrival date & time: 10/27/23  2037     Patient presents with: Wound Infection   Jake Nguyen is a 58 y.o. male.   Patient is a 58 year old male with a history of alcohol  use disorder, left AKA and right BKA who presents with possible infection from his right leg.  On chart review, he has been admitted multiple times in the past for recurrent infections in the right stump.  He is currently homeless.  He does not change the dressings regularly per prior notes.  Patient is fairly sleepy and does not answer questions well.  He is noted to be tachycardic and hypotensive (BP 90/42).       Prior to Admission medications   Medication Sig Start Date End Date Taking? Authorizing Provider  atorvastatin  (LIPITOR) 40 MG tablet Take 1 tablet (40 mg total) by mouth daily. 09/29/23   Lue Elsie BROCKS, MD  folic acid  (FOLVITE ) 1 MG tablet Take 1 tablet (1 mg total) by mouth daily. 09/30/23   Lue Elsie BROCKS, MD  gabapentin  (NEURONTIN ) 100 MG capsule Take 1 capsule (100 mg total) by mouth 3 (three) times daily. 09/29/23   Lue Elsie BROCKS, MD  Multiple Vitamin (MULTIVITAMIN WITH MINERALS) TABS tablet Take 1 tablet by mouth daily. 09/30/23   Lue Elsie BROCKS, MD  pantoprazole  (PROTONIX ) 40 MG tablet Take 1 tablet (40 mg total) by mouth daily. 09/30/23   Lue Elsie BROCKS, MD  thiamine  (VITAMIN B1) 100 MG tablet Take 1 tablet (100 mg total) by mouth daily. 09/30/23   Lue Elsie BROCKS, MD    Allergies: Pork-derived products    Review of Systems  Unable to perform ROS: Mental status change    Updated Vital Signs BP 110/78   Pulse (!) 110   Temp 98 F (36.7 C) (Oral)   Resp 18   SpO2 96%   Physical Exam Constitutional:      Comments: Sleepy but will verbally respond to stimuli.  Is not very communicative  HENT:     Head: Normocephalic and atraumatic.     Mouth/Throat:     Mouth:  Mucous membranes are moist.  Eyes:     Pupils: Pupils are equal, round, and reactive to light.  Cardiovascular:     Rate and Rhythm: Tachycardia present.     Heart sounds: No murmur heard. Pulmonary:     Effort: Pulmonary effort is normal.     Breath sounds: Normal breath sounds.  Abdominal:     General: Abdomen is flat. There is no distension.     Tenderness: There is no abdominal tenderness.  Skin:    General: Skin is warm and dry.     Comments: Multiple chronic appearing excoriated/scaly rash to trunk and extremities.  Neurological:     Comments: Sleepy but will wake up and answer questions, seems to be moving extremities symmetrically     (all labs ordered are listed, but only abnormal results are displayed) Labs Reviewed  COMPREHENSIVE METABOLIC PANEL WITH GFR - Abnormal; Notable for the following components:      Result Value   Potassium 3.3 (*)    Glucose, Bld 111 (*)    BUN <5 (*)    Creatinine, Ser 0.47 (*)    Calcium  7.8 (*)    Total Protein 8.3 (*)    Albumin 3.0 (*)    All other components within normal limits  CBC WITH DIFFERENTIAL/PLATELET - Abnormal;  Notable for the following components:   Platelets 135 (*)    Neutro Abs 1.2 (*)    All other components within normal limits  ETHANOL - Abnormal; Notable for the following components:   Alcohol , Ethyl (B) 460 (*)    All other components within normal limits  RESP PANEL BY RT-PCR (RSV, FLU A&B, COVID)  RVPGX2  CULTURE, BLOOD (ROUTINE X 2)  CULTURE, BLOOD (ROUTINE X 2)  PROTIME-INR  URINALYSIS, W/ REFLEX TO CULTURE (INFECTION SUSPECTED)  I-STAT CG4 LACTIC ACID, ED  I-STAT CG4 LACTIC ACID, ED    EKG: EKG Interpretation Date/Time:  Friday October 27 2023 21:16:34 EDT Ventricular Rate:  104 PR Interval:  179 QRS Duration:  97 QT Interval:  354 QTC Calculation: 466 R Axis:   98  Text Interpretation: Sinus tachycardia Borderline right axis deviation Low voltage, extremity leads Confirmed by Lenor Hollering  [phone]) on 10/27/2023 11:26:57 PM  Radiology: CT Head Wo Contrast Result Date: 10/27/2023 CLINICAL DATA:  Altered mental status. EXAM: CT HEAD WITHOUT CONTRAST TECHNIQUE: Contiguous axial images were obtained from the base of the skull through the vertex without intravenous contrast. RADIATION DOSE REDUCTION: This exam was performed according to the departmental dose-optimization program which includes automated exposure control, adjustment of the mA and/or kV according to patient size and/or use of iterative reconstruction technique. COMPARISON:  October 22, 2023 FINDINGS: Brain: There is generalized cerebral atrophy with widening of the extra-axial spaces and ventricular dilatation. There are areas of decreased attenuation within the white matter tracts of the supratentorial brain, consistent with microvascular disease changes. Vascular: No hyperdense vessel or unexpected calcification. Skull: Normal. Negative for fracture or focal lesion. Sinuses/Orbits: No acute finding. Other: Mild right frontal scalp soft tissue swelling is seen. IMPRESSION: 1. Generalized cerebral atrophy and microvascular disease changes of the supratentorial brain. 2. No acute intracranial abnormality. 3. Mild right frontal scalp soft tissue swelling. Electronically Signed   By: Suzen Dials M.D.   On: 10/27/2023 23:17   DG Chest Port 1 View Result Date: 10/27/2023 CLINICAL DATA:  Possible sepsis EXAM: PORTABLE CHEST 1 VIEW COMPARISON:  07/20/2023 FINDINGS: No pleural effusion or pneumothorax. Streaky infrahilar opacities. Normal cardiac size. IMPRESSION: Streaky infrahilar opacities, atelectasis versus pneumonia infiltrate Electronically Signed   By: Luke Bun M.D.   On: 10/27/2023 23:17   DG Knee Complete 4 Views Right Result Date: 10/27/2023 CLINICAL DATA:  Drainage from amputation site EXAM: RIGHT KNEE - COMPLETE 4+ VIEW COMPARISON:  09/20/2023, 09/04/2023 FINDINGS: Status post below the knee amputation. Increased soft  tissue swelling the distal stump. No soft tissue gas. Possible subtle erosive change at the cut margin of the distal fibula, slightly progressing periostitis at the cut ends of the distal tibia and fibula. IMPRESSION: Status post below the knee amputation with increased soft tissue swelling of the stump. Slightly progressive periostitis at the cut ends of the distal tibia and fibula with question subtle erosive change at the distal fibular cut margin raising possibility of osteomyelitis. Suggest correlation with MRI. Electronically Signed   By: Luke Bun M.D.   On: 10/27/2023 23:15     Procedures   Medications Ordered in the ED  lactated ringers  infusion (0 mLs Intravenous Paused 10/27/23 2233)  lactated ringers  bolus 1,000 mL (0 mLs Intravenous Paused 10/27/23 2232)  vancomycin  (VANCOCIN ) IVPB 1000 mg/200 mL premix (has no administration in time range)  azithromycin (ZITHROMAX) 500 mg in sodium chloride  0.9 % 250 mL IVPB (has no administration in time range)  cefTRIAXone  (ROCEPHIN )  2 g in sodium chloride  0.9 % 100 mL IVPB (2 g Intravenous New Bag/Given 10/27/23 2236)                                    Medical Decision Making Amount and/or Complexity of Data Reviewed Labs: ordered. Radiology: ordered.  Risk Prescription drug management. Decision regarding hospitalization.   This patient presents to the ED for concern of altered mental status, wound drainage, this involves an extensive number of treatment options, and is a complaint that carries with it a high risk of complications and morbidity.  I considered the following differential and admission for this acute, potentially life threatening condition.  The differential diagnosis includes osteomyelitis, cellulitis, sepsis, pneumonia, intracranial hemorrhage, electrolyte abnormality, abscess  MDM:    Patient is a 58 year old male with a history of alcohol  abuse and recurrent infections in his right stump who presents with drainage  from his leg as well as tachycardia and hypotension.  He was noted to be hypotensive with blood pressures in the 90s and tachycardic with heart rates in the 110s.  He was started on septic protocol with IV fluids and IV antibiotics.  His blood work shows normal white count.  His lactate is normal.  His oral temperature was normal.  His alcohol  level was markedly elevated.  Head CT was done because of his altered mental status.  No evidence of intracranial hemorrhage.  X-ray of the right knee shows questionable osteomyelitis.  Chest x-ray shows interstitial markings concerning for possible pneumonia versus possible aspiration.  Will consult with the hospitalist for admission.  Discussed with Dr. Tobie.  CRITICAL CARE Performed by: Andrea Ness Total critical care time: 60 minutes Critical care time was exclusive of separately billable procedures and treating other patients. Critical care was necessary to treat or prevent imminent or life-threatening deterioration. Critical care was time spent personally by me on the following activities: development of treatment plan with patient and/or surrogate as well as nursing, discussions with consultants, evaluation of patient's response to treatment, examination of patient, obtaining history from patient or surrogate, ordering and performing treatments and interventions, ordering and review of laboratory studies, ordering and review of radiographic studies, pulse oximetry and re-evaluation of patient's condition.   (Labs, imaging, consults)  Labs: I Ordered, and personally interpreted labs.  The pertinent results include: Normal white count, normal lactate, elevated EtOH  Imaging Studies ordered: I ordered imaging studies including head CT, x-ray knee, chest x-ray I independently visualized and interpreted imaging. I agree with the radiologist interpretation  Additional history obtained from chart.  External records from outside source obtained and  reviewed including prior hospitalization notes  Cardiac Monitoring: The patient was maintained on a cardiac monitor.  If on the cardiac monitor, I personally viewed and interpreted the cardiac monitored which showed an underlying rhythm of: Sinus tachycardia  Reevaluation: After the interventions noted above, I reevaluated the patient and found that they have :improved  Social Determinants of Health:  homelessness, alcohol  abuse  Disposition: Admit to hospital  Co morbidities that complicate the patient evaluation  Past Medical History:  Diagnosis Date   Acute metabolic encephalopathy 11/14/2020   Alcohol  abuse with alcohol -induced mood disorder (HCC) 11/10/2019   Alcohol  withdrawal syndrome, with delirium (HCC) 11/06/2019   Alcoholism (HCC)    Anxiety    Depression    Influenza A 05/20/2018   Sepsis (HCC) 11/14/2020   Septic shock (HCC) 11/24/2020  Medicines Meds ordered this encounter  Medications   lactated ringers  infusion   lactated ringers  bolus 1,000 mL    Reason 30 mL/kg dose is not being ordered:   First Lactic Acid Pending   cefTRIAXone  (ROCEPHIN ) 2 g in sodium chloride  0.9 % 100 mL IVPB    Antibiotic Indication::   Cellulitis   vancomycin  (VANCOCIN ) IVPB 1000 mg/200 mL premix    Indication::   Cellulitis   azithromycin (ZITHROMAX) 500 mg in sodium chloride  0.9 % 250 mL IVPB    I have reviewed the patients home medicines and have made adjustments as needed  Problem List / ED Course: Problem List Items Addressed This Visit       Other   Sepsis (HCC) - Primary   Right BKA Wound infection   Relevant Medications   vancomycin  (VANCOCIN ) IVPB 1000 mg/200 mL premix   azithromycin (ZITHROMAX) 500 mg in sodium chloride  0.9 % 250 mL IVPB   Other Visit Diagnoses       Pneumonia due to infectious organism, unspecified laterality, unspecified part of lung       Relevant Medications   cefTRIAXone  (ROCEPHIN ) 2 g in sodium chloride  0.9 % 100 mL IVPB (Completed)    vancomycin  (VANCOCIN ) IVPB 1000 mg/200 mL premix   azithromycin (ZITHROMAX) 500 mg in sodium chloride  0.9 % 250 mL IVPB     Alcoholic intoxication without complication (HCC)                    Final diagnoses:  Sepsis without acute organ dysfunction, due to unspecified organism (HCC)  Wound infection  Pneumonia due to infectious organism, unspecified laterality, unspecified part of lung  Alcoholic intoxication without complication [facility])    ED Discharge Orders     None          Lenor Hollering, MD 10/27/23 [id number]    Lenor Hollering, MD 10/27/23 2337

## 2023-10-27 NOTE — ED Triage Notes (Signed)
 [facility] Pt c/o drainage from amputation site. Pt [facility]. Pt denies weakness, dizziness, or pain. Pt has a new case of incontinence. 118/62 112HR 18RR 93% 3L Opal 126CBG

## 2023-10-27 NOTE — H&P (Signed)
 History and Physical    Jake Nguyen FMW:[id number] DOB: 04/03/1966 DOA: 10/27/2023  PCP: Pcp, No  Patient coming from: Home  I have personally briefly reviewed patient's old medical records in [facility]  Chief Complaint: Drainage from right BKA stump  HPI: Jake Nguyen is a 58 y.o. male with medical history significant for severe alcohol  use disorder, depression/anxiety, homelessness, s/p left AKA (2022), s/p right BKA (08/20/2023) with recurrent stump site infection who presented to the ED via EMS from the streets with complaint of drainage from right stump amputation site and alcohol  intoxication.  Patient is unable to provide history due to alcohol  intoxication and is otherwise supplemented by EDP and chart review.  Patient with multiple recent admissions related to chronic right lower extremity infection.  Eventually developed gangrene and underwent right BKA on 08/20/2023 by Dr. Harden.  Was admitted twice since then for stump site infections.  Most recently admitted 6/11-6/20.  Treated with IV vancomycin  and Rocephin  on admission, narrowed to doxycycline  and Ancef , and ultimately transition to oral Augmentin  and doxycycline .  Completed full course of antibiotics in hospital.  Seen by Dr. Harden for some dehiscence who recommended applying shrinkers and follow-up as an outpatient.  Patient brought in by EMS from the streets.  He reportedly was seeing drainage from his stump site.  He is acutely intoxicated on alcohol  and not able to provide any further relevant history.  ED Course  Labs/Imaging on admission: I have personally reviewed following labs and imaging studies.  Initial vitals showed BP 103/80, pulse 116, RR 15, temp 98.0 F, SpO2 100%.  Labs showed WBC 5.3, hemoglobin 13.4, platelets 135, sodium 140, potassium 3.3, bicarb 26, BUN <5, creatinine 0.47, LFTs within normal limits.  Serum ethanol 460.  Lactic acid 1.9.  Blood cultures in process.  Right knee x-ray showed  BKA changes with increased soft tissue swelling of the stump.  Slightly progressive periostitis at the cut ends of the distal tibia and fibula with question subtle erosive change at the distal fibular cut margin raising possibility of osteomyelitis.  Portable chest x-ray with streaky infrahilar opacities.  CT head without contrast showed generalized cerebral atrophy and microvascular disease changes of the supratentorial brain.  No acute intracranial abnormality.  Mild right frontal scalp soft tissue swelling.  Patient was given IV vancomycin , ceftriaxone , azithromycin , 1 L LR.  The hospitalist service was consulted to admit.  Review of Systems:  Unable to obtain full review of systems from patient due to acute alcohol  intoxication.   Past Medical History:  Diagnosis Date   Acute metabolic encephalopathy 11/14/2020   Alcohol  abuse with alcohol -induced mood disorder (HCC) 11/10/2019   Alcohol  withdrawal syndrome, with delirium (HCC) 11/06/2019   Alcoholism (HCC)    Anxiety    Depression    Influenza A 05/20/2018   Sepsis (HCC) 11/14/2020   Septic shock (HCC) 11/24/2020    Past Surgical History:  Procedure Laterality Date   ABDOMINAL AORTOGRAM W/LOWER EXTREMITY N/A 08/04/2020   Procedure: ABDOMINAL AORTOGRAM W/LOWER EXTREMITY;  Surgeon: Serene Gaile ORN, MD;  Location: [facility] INVASIVE CV LAB;  Service: Cardiovascular;  Laterality: N/A;   AMPUTATION Left 08/05/2020   Procedure: AMPUTATION BELOW KNEE;  Surgeon: Serene Gaile ORN, MD;  Location: [facility] OR;  Service: Vascular;  Laterality: Left;   AMPUTATION Left 11/21/2020   Procedure: REVISION LEFT ABOVE KNEE AMPUTATION;  Surgeon: Serene Gaile ORN, MD;  Location: [facility] OR;  Service: Vascular;  Laterality: Left;   AMPUTATION Right 08/20/2023  Procedure: AMPUTATION BELOW KNEE;  Surgeon: Harden Jerona GAILS, MD;  Location: [facility] OR;  Service: Orthopedics;  Laterality: Right;   APPLICATION OF WOUND VAC Right 08/20/2023   Procedure: APPLICATION, WOUND VAC;   Surgeon: Harden Jerona GAILS, MD;  Location: [facility] OR;  Service: Orthopedics;  Laterality: Right;    Social History: Patient not able to provide relevant history as he is acutely intoxicated on alcohol .  Serum ethanol level elevated at 460.  Allergies  Allergen Reactions   Pork-Derived Products Other (See Comments)    Pt states he doesn't eat pork    Family History  Family history unknown: Yes     Prior to Admission medications   Medication Sig Start Date End Date Taking? Authorizing Provider  atorvastatin  (LIPITOR) 40 MG tablet Take 1 tablet (40 mg total) by mouth daily. 09/29/23   Lue Elsie BROCKS, MD  folic acid  (FOLVITE ) 1 MG tablet Take 1 tablet (1 mg total) by mouth daily. 09/30/23   Lue Elsie BROCKS, MD  gabapentin  (NEURONTIN ) 100 MG capsule Take 1 capsule (100 mg total) by mouth 3 (three) times daily. 09/29/23   Lue Elsie BROCKS, MD  Multiple Vitamin (MULTIVITAMIN WITH MINERALS) TABS tablet Take 1 tablet by mouth daily. 09/30/23   Lue Elsie BROCKS, MD  pantoprazole  (PROTONIX ) 40 MG tablet Take 1 tablet (40 mg total) by mouth daily. 09/30/23   Lue Elsie BROCKS, MD  thiamine  (VITAMIN B1) 100 MG tablet Take 1 tablet (100 mg total) by mouth daily. 09/30/23   Lue Elsie BROCKS, MD    Physical Exam: Vitals:   10/27/23 2055 10/27/23 2100 10/27/23 2215  BP:  103/80 110/78  Pulse:  (!) 128 (!) 110  Resp:  15 18  Temp: 98 F (36.7 C)    TempSrc: Oral    SpO2:  100% 96%   Exam limited due to alcohol  intoxication Constitutional: Chronically ill-appearing man resting in bed, intoxicated, somnolent Eyes: EOMI, lids and conjunctivae normal ENMT: Mucous membranes are dry.  Neck: normal, supple, no masses. Respiratory: Mild expiratory wheezing. Normal respiratory effort. No accessory muscle use.  Cardiovascular: Tachycardic, no murmurs / rubs / gallops.  Abdomen: no tenderness, no masses palpated. Musculoskeletal: S/p left AKA.  S/p right BKA with erythema at stump site  with some serous drainage.  No fluctuance.  See picture below. Skin: Multiple chronic appearing excoriated and scaly rash lesions on trunk and extremities.  Right BKA stump changes as pictured below Neurologic: Moving all extremities equally Psychiatric: Intoxicated   EKG: Personally reviewed. Sinus tachycardia, rate 104, no acute ischemic changes.  Assessment/Plan Principal Problem:   Right BKA Wound infection Active Problems:   Hypokalemia   Alcohol  use disorder, severe, dependence (HCC)   SAVIR BLANKE is a 58 y.o. male with medical history significant for severe alcohol  use disorder, depression/anxiety, homelessness, s/p left AKA (2022), s/p right BKA (08/20/2023) with recurrent stump site infection who is admitted with concern for right BKA infection/osteomyelitis.  Assessment and Plan: S/p right BKA (08/20/2023) with concern for stump infection/osteomyelitis: Right knee x-ray with changes concerning for possible osteomyelitis at the cut ends of distal tibia/fibula.  He is tachycardic but otherwise hemodynamically stable.  Sepsis not present on admission.  No leukocytosis.  CXR with likely atelectasis rather than pneumonia, hold further azithromycin . - Continue IV vancomycin  and ceftriaxone  - Obtain MRI for further evaluation  Severe alcohol  use disorder with intoxication: Serum ethanol 460 on admission.  High risk for withdrawal. - Placed on CIWA protocol with Ativan  as needed -  Start Librium  taper - Continue IV fluid hydration overnight - Continue MVM, folate, thiamine   Hypokalemia: Supplement.  Thrombocytopenia: Mild and new when compared to previous labs.  Continue to monitor.   DVT prophylaxis: None.  Patient is a bilateral lower extremity amputee and has refused Lovenox /heparin  previously as he does not accept pork derived products. Code Status: Full code Family Communication: None present on admission Disposition Plan: Homeless, dispo pending clinical  progress Consults called: None Severity of Illness: The appropriate patient status for this patient is OBSERVATION. Observation status is judged to be reasonable and necessary in order to provide the required intensity of service to ensure the patient's safety. The patient's presenting symptoms, physical exam findings, and initial radiographic and laboratory data in the context of their medical condition is felt to place them at decreased risk for further clinical deterioration. Furthermore, it is anticipated that the patient will be medically stable for discharge from the hospital within 2 midnights of admission.   Jorie Blanch MD [facility]  If 7PM-7AM, please contact night-coverage [email or web address]  10/28/2023, 12:16 AM

## 2023-10-27 NOTE — ED Notes (Signed)
 1 set of Cultures drawn prior to antibiotics. MD notified

## 2023-10-28 ENCOUNTER — Encounter (HOSPITAL_COMMUNITY): Payer: Self-pay | Admitting: Internal Medicine

## 2023-10-28 ENCOUNTER — Inpatient Hospital Stay (HOSPITAL_COMMUNITY): Payer: MEDICAID

## 2023-10-28 DIAGNOSIS — T8743 Infection of amputation stump, right lower extremity: Secondary | ICD-10-CM | POA: Diagnosis present

## 2023-10-28 DIAGNOSIS — E871 Hypo-osmolality and hyponatremia: Secondary | ICD-10-CM | POA: Diagnosis not present

## 2023-10-28 DIAGNOSIS — F1020 Alcohol dependence, uncomplicated: Secondary | ICD-10-CM | POA: Diagnosis not present

## 2023-10-28 DIAGNOSIS — T148XXA Other injury of unspecified body region, initial encounter: Secondary | ICD-10-CM | POA: Diagnosis present

## 2023-10-28 DIAGNOSIS — J189 Pneumonia, unspecified organism: Secondary | ICD-10-CM

## 2023-10-28 DIAGNOSIS — A419 Sepsis, unspecified organism: Secondary | ICD-10-CM | POA: Diagnosis not present

## 2023-10-28 DIAGNOSIS — L89322 Pressure ulcer of left buttock, stage 2: Secondary | ICD-10-CM | POA: Diagnosis present

## 2023-10-28 DIAGNOSIS — Z1152 Encounter for screening for COVID-19: Secondary | ICD-10-CM | POA: Diagnosis not present

## 2023-10-28 DIAGNOSIS — F10920 Alcohol use, unspecified with intoxication, uncomplicated: Secondary | ICD-10-CM | POA: Diagnosis not present

## 2023-10-28 DIAGNOSIS — Y908 Blood alcohol level of 240 mg/100 ml or more: Secondary | ICD-10-CM | POA: Diagnosis present

## 2023-10-28 DIAGNOSIS — Z91014 Allergy to mammalian meats: Secondary | ICD-10-CM | POA: Diagnosis not present

## 2023-10-28 DIAGNOSIS — D638 Anemia in other chronic diseases classified elsewhere: Secondary | ICD-10-CM | POA: Diagnosis present

## 2023-10-28 DIAGNOSIS — L089 Local infection of the skin and subcutaneous tissue, unspecified: Secondary | ICD-10-CM | POA: Diagnosis not present

## 2023-10-28 DIAGNOSIS — G928 Other toxic encephalopathy: Secondary | ICD-10-CM | POA: Diagnosis present

## 2023-10-28 DIAGNOSIS — Z79899 Other long term (current) drug therapy: Secondary | ICD-10-CM | POA: Diagnosis not present

## 2023-10-28 DIAGNOSIS — Z89612 Acquired absence of left leg above knee: Secondary | ICD-10-CM | POA: Diagnosis not present

## 2023-10-28 DIAGNOSIS — E876 Hypokalemia: Secondary | ICD-10-CM | POA: Diagnosis present

## 2023-10-28 DIAGNOSIS — F10220 Alcohol dependence with intoxication, uncomplicated: Secondary | ICD-10-CM | POA: Diagnosis present

## 2023-10-28 DIAGNOSIS — D696 Thrombocytopenia, unspecified: Secondary | ICD-10-CM | POA: Diagnosis present

## 2023-10-28 DIAGNOSIS — Y835 Amputation of limb(s) as the cause of abnormal reaction of the patient, or of later complication, without mention of misadventure at the time of the procedure: Secondary | ICD-10-CM | POA: Diagnosis present

## 2023-10-28 DIAGNOSIS — Z5900 Homelessness unspecified: Secondary | ICD-10-CM | POA: Diagnosis not present

## 2023-10-28 LAB — URINALYSIS, W/ REFLEX TO CULTURE (INFECTION SUSPECTED)
Bilirubin Urine: NEGATIVE
Glucose, UA: NEGATIVE mg/dL
Hgb urine dipstick: NEGATIVE
Ketones, ur: NEGATIVE mg/dL
Leukocytes,Ua: NEGATIVE
Nitrite: NEGATIVE
Protein, ur: NEGATIVE mg/dL
Specific Gravity, Urine: 1.006 (ref 1.005–1.030)
pH: 5 (ref 5.0–8.0)

## 2023-10-28 LAB — BASIC METABOLIC PANEL WITH GFR
Anion gap: 10 (ref 5–15)
BUN: <5 mg/dL — ABNORMAL LOW (ref 6–20)
CO2: 27 mmol/L (ref 22–32)
Calcium: 7.4 mg/dL — ABNORMAL LOW (ref 8.9–10.3)
Chloride: 102 mmol/L (ref 98–111)
Creatinine, Ser: 0.48 mg/dL — ABNORMAL LOW (ref 0.61–1.24)
GFR, Estimated: >60 mL/min (ref >60)
Glucose, Bld: 102 mg/dL — ABNORMAL HIGH (ref 70–99)
Potassium: 3.7 mmol/L (ref 3.5–5.1)
Sodium: 139 mmol/L (ref 135–145)

## 2023-10-28 LAB — CBC
HCT: 32.6 % — ABNORMAL LOW (ref 39.0–52.0)
Hemoglobin: 10.6 g/dL — ABNORMAL LOW (ref 13.0–17.0)
MCH: 28.6 pg (ref 26.0–34.0)
MCHC: 32.5 g/dL (ref 30.0–36.0)
MCV: 87.9 fL (ref 80.0–100.0)
Platelets: 108 K/uL — ABNORMAL LOW (ref 150–400)
RBC: 3.71 MIL/uL — ABNORMAL LOW (ref 4.22–5.81)
RDW: 15.1 % (ref 11.5–15.5)
WBC: 5.7 K/uL (ref 4.0–10.5)
nRBC: 0 % (ref 0.0–0.2)

## 2023-10-28 LAB — RESP PANEL BY RT-PCR (RSV, FLU A&B, COVID)  RVPGX2
Influenza A by PCR: NEGATIVE
Influenza B by PCR: NEGATIVE
Resp Syncytial Virus by PCR: NEGATIVE
SARS Coronavirus 2 by RT PCR: NEGATIVE

## 2023-10-28 MED ORDER — POTASSIUM CHLORIDE 20 MEQ PO PACK
40.0000 meq | PACK | Freq: Once | ORAL | Status: AC
  Administered 2023-10-28: 40 meq via ORAL
  Filled 2023-10-28: qty 2

## 2023-10-28 MED ORDER — ADULT MULTIVITAMIN W/MINERALS CH
1.0000 | ORAL_TABLET | Freq: Every day | ORAL | Status: DC
  Administered 2023-10-28 – 2023-11-06 (×10): 1 via ORAL
  Filled 2023-10-28 (×10): qty 1

## 2023-10-28 MED ORDER — VANCOMYCIN HCL IN DEXTROSE 1-5 GM/200ML-% IV SOLN
1000.0000 mg | Freq: Two times a day (BID) | INTRAVENOUS | Status: AC
  Administered 2023-10-28 – 2023-11-04 (×14): 1000 mg via INTRAVENOUS
  Filled 2023-10-28 (×14): qty 200

## 2023-10-28 MED ORDER — LORAZEPAM 2 MG/ML IJ SOLN: 1.0000 mg | INTRAMUSCULAR | Status: AC | PRN

## 2023-10-28 MED ORDER — ACETAMINOPHEN 325 MG PO TABS
650.0000 mg | ORAL_TABLET | Freq: Four times a day (QID) | ORAL | Status: DC | PRN
  Administered 2023-10-28 – 2023-11-02 (×4): 650 mg via ORAL
  Filled 2023-10-28 (×4): qty 2

## 2023-10-28 MED ORDER — ACETAMINOPHEN 650 MG RE SUPP: 650.0000 mg | Freq: Four times a day (QID) | RECTAL | Status: DC | PRN

## 2023-10-28 MED ORDER — SENNOSIDES-DOCUSATE SODIUM 8.6-50 MG PO TABS: 1.0000 | ORAL_TABLET | Freq: Every evening | ORAL | Status: DC | PRN

## 2023-10-28 MED ORDER — FOLIC ACID 1 MG PO TABS
1.0000 mg | ORAL_TABLET | Freq: Every day | ORAL | Status: DC
  Administered 2023-10-28 – 2023-11-06 (×10): 1 mg via ORAL
  Filled 2023-10-28 (×10): qty 1

## 2023-10-28 MED ORDER — ONDANSETRON HCL 4 MG/2ML IJ SOLN: 4.0000 mg | Freq: Four times a day (QID) | INTRAMUSCULAR | Status: DC | PRN

## 2023-10-28 MED ORDER — CHLORDIAZEPOXIDE HCL 25 MG PO CAPS
25.0000 mg | ORAL_CAPSULE | Freq: Four times a day (QID) | ORAL | Status: AC
  Administered 2023-10-28 – 2023-10-29 (×6): 25 mg via ORAL
  Filled 2023-10-28 (×6): qty 1

## 2023-10-28 MED ORDER — THIAMINE MONONITRATE 100 MG PO TABS
100.0000 mg | ORAL_TABLET | Freq: Every day | ORAL | Status: DC
  Administered 2023-10-28 – 2023-11-06 (×10): 100 mg via ORAL
  Filled 2023-10-28 (×10): qty 1

## 2023-10-28 MED ORDER — CHLORDIAZEPOXIDE HCL 25 MG PO CAPS
25.0000 mg | ORAL_CAPSULE | Freq: Every day | ORAL | Status: AC
  Administered 2023-10-31: 25 mg via ORAL
  Filled 2023-10-28: qty 1

## 2023-10-28 MED ORDER — CHLORDIAZEPOXIDE HCL 25 MG PO CAPS
25.0000 mg | ORAL_CAPSULE | ORAL | Status: AC
  Administered 2023-10-30 – 2023-10-31 (×2): 25 mg via ORAL
  Filled 2023-10-28 (×2): qty 1

## 2023-10-28 MED ORDER — THIAMINE HCL 100 MG/ML IJ SOLN
100.0000 mg | Freq: Every day | INTRAMUSCULAR | Status: DC
  Filled 2023-10-28 (×2): qty 2

## 2023-10-28 MED ORDER — SODIUM CHLORIDE 0.9 % IV SOLN
2.0000 g | INTRAVENOUS | Status: AC
  Administered 2023-10-28 – 2023-11-03 (×7): 2 g via INTRAVENOUS
  Filled 2023-10-28 (×7): qty 20

## 2023-10-28 MED ORDER — CHLORDIAZEPOXIDE HCL 25 MG PO CAPS
25.0000 mg | ORAL_CAPSULE | Freq: Three times a day (TID) | ORAL | Status: AC
  Administered 2023-10-29 – 2023-10-30 (×3): 25 mg via ORAL
  Filled 2023-10-28 (×3): qty 1

## 2023-10-28 MED ORDER — LORAZEPAM 1 MG PO TABS: 1.0000 mg | ORAL_TABLET | ORAL | Status: AC | PRN

## 2023-10-28 MED ORDER — LOPERAMIDE HCL 2 MG PO CAPS: 2.0000 mg | ORAL_CAPSULE | ORAL | Status: AC | PRN

## 2023-10-28 MED ORDER — SODIUM CHLORIDE 0.9% FLUSH
3.0000 mL | Freq: Two times a day (BID) | INTRAVENOUS | Status: DC
  Administered 2023-10-28 – 2023-11-06 (×16): 3 mL via INTRAVENOUS

## 2023-10-28 MED ORDER — ONDANSETRON HCL 4 MG PO TABS: 4.0000 mg | ORAL_TABLET | Freq: Four times a day (QID) | ORAL | Status: DC | PRN

## 2023-10-28 NOTE — Progress Notes (Signed)
   10/28/23 1009  TOC Brief Assessment  Insurance and Status Reviewed [place] Tailored Plan)  Patient has primary care physician No  Home environment has been reviewed Reports Homeless  Prior level of function: Need Assistance/Uses WC/Double amputee  Prior/Current Home Services No current home services  Social Drivers of Health Review SDOH reviewed needs interventions  Readmission risk has been reviewed Yes  Transition of care needs transition of care needs identified, TOC will continue to follow   Pt could benefit from resources, TOC to follow.

## 2023-10-28 NOTE — Progress Notes (Signed)
 [facility]  PROGRESS NOTE  Jake Nguyen FMW:[id number] DOB: [birth year] DOA: 10/27/2023 PCP: Pcp, No   Brief HPI:    58 y.o. 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  Eventually developed gangrene and underwent right BKA on 08/20/2023 by Dr. Harden.  Was admitted twice since then for stump site infections.  Most recently admitted 6/11-6/20.  Treated with IV vancomycin  and Rocephin  on admission, narrowed to doxycycline  and Ancef , and ultimately transition to oral Augmentin  and doxycycline .  Completed full course of antibiotics in hospital.  Seen by Dr. Harden for some dehiscence who recommended applying shrinkers and follow-up as an outpatient.  Right knee x-ray showed BKA changes with increased soft tissue swelling of the stump.  Slightly progressive periostitis at the cut ends of the distal tibia and fibula with question subtle erosive change at the distal fibular cut margin raising possibility of osteomyelitis.   Portable chest x-ray with streaky infrahilar opacities.   CT head without contrast showed generalized cerebral atrophy and microvascular disease changes of the supratentorial brain.  No acute intracranial abnormality.  Mild right frontal scalp soft tissue swelling.   Patient was given IV vancomycin , ceftriaxone , azithromycin , 1 L LR.  The hospitalist service was consulted to admit.   Assessment/Plan:   S/p right BKA (08/20/2023) with concern for stump infection/osteomyelitis: Right knee x-ray with changes concerning for possible osteomyelitis at the cut ends of distal tibia/fibula.  He is tachycardic but otherwise  hemodynamically stable.  Sepsis not present on admission.  No leukocytosis.  CXR with likely atelectasis rather than pneumonia, hold further azithromycin . - Continue IV vancomycin  and ceftriaxone  - MRI of the right lower extremity pending   Severe alcohol  use disorder with intoxication: Serum ethanol 460 on admission.  High risk for withdrawal. - Placed on CIWA protocol with Ativan  as needed - Start Librium  taper    Hypokalemia: Replete   Thrombocytopenia: Mild and new when compared to previous labs.   Continue to monitor.    Medications     chlordiazePOXIDE   25 mg Oral QID   Followed by   [name] ON 10/29/2023] chlordiazePOXIDE   25 mg Oral TID   Followed by   [name] ON 10/30/2023] chlordiazePOXIDE   25 mg Oral BH-qamhs   Followed by   [name] ON 10/31/2023] chlordiazePOXIDE   25 mg Oral Daily   folic acid   1 mg Oral Daily   multivitamin with minerals  1 tablet Oral Daily   sodium chloride  flush  3 mL Intravenous Q12H   thiamine   100 mg Oral Daily   Or   thiamine   100 mg Intravenous Daily     Data Reviewed:   CBG:  Recent Labs  Lab 10/22/23 1323  GLUCAP 104*    SpO2: 96 % O2 Flow Rate (L/min): 2 L/min    Vitals:   10/27/23 2215 10/28/23 0054 10/28/23 0703 10/28/23 0704  BP: 110/78 102/68 (!) 88/40 102/69  Pulse: (!) 110 88 88 84  Resp: 18 19 18    Temp:  98 F (36.7 C) 98.6 F (37 C)   TempSrc:  Oral Oral   SpO2: 96% 94% 96%   Weight:    67.3 kg  Height:    5' 10 (  1.778 m)      Data Reviewed:  Basic Metabolic Panel: Recent Labs  Lab 10/27/23 2234 10/28/23 0408  NA 140 139  K 3.3* 3.7  CL 100 102  CO2 26 27  GLUCOSE 111* 102*  BUN <5* <5*  CREATININE 0.47* 0.48*  CALCIUM  7.8* 7.4*    CBC: Recent Labs  Lab 10/27/23 2234 10/28/23 0408  WBC 5.3 5.7  NEUTROABS 1.2*  --   HGB 13.4 10.6*  HCT 42.0 32.6*  MCV 86.8 87.9  PLT 135* 108*    LFT Recent Labs  Lab 10/27/23 2234  AST 24  ALT 10  ALKPHOS 86  BILITOT 0.5  PROT 8.3*   ALBUMIN 3.0*     Antibiotics: Anti-infectives (From admission, onward)    Start     Dose/Rate Route Frequency Ordered Stop   10/28/23 2200  cefTRIAXone  (ROCEPHIN ) 2 g in sodium chloride  0.9 % 100 mL IVPB        2 g 200 mL/hr over 30 Minutes Intravenous Every 24 hours 10/28/23 0000     10/28/23 1000  vancomycin  (VANCOCIN ) IVPB 1000 mg/200 mL premix        1,000 mg 200 mL/hr over 60 Minutes Intravenous Every 12 hours 10/28/23 0041     10/27/23 2330  azithromycin  (ZITHROMAX ) 500 mg in sodium chloride  0.9 % 250 mL IVPB        500 mg 250 mL/hr over 60 Minutes Intravenous  Once 10/27/23 2328 10/28/23 0451   10/27/23 2200  cefTRIAXone  (ROCEPHIN ) 2 g in sodium chloride  0.9 % 100 mL IVPB        2 g 200 mL/hr over 30 Minutes Intravenous Once 10/27/23 2155 10/27/23 2341   10/27/23 2200  vancomycin  (VANCOCIN ) IVPB 1000 mg/200 mL premix        1,000 mg 200 mL/hr over 60 Minutes Intravenous  Once 10/27/23 2155 10/28/23 0112        DVT prophylaxis: Patient has allergy to pork derived products  Code Status: Full code  Family Communication:    CONSULTS    Subjective   Denies tremors in upper extremities dry   Objective    Physical Examination:   Appears in no acute distress S1-S2, regular, no murmur auscultated Abdomen is soft, nontender Extremities- bilateral BKA, right stump in dressing  Status is: Inpatient:             Jake Nguyen   [facility] If 7PM-7AM, please contact night-coverage at [email or web address], Office  [phone]   10/28/2023, 8:22 AM  LOS: 0 days

## 2023-10-28 NOTE — Progress Notes (Signed)
 Pharmacy Antibiotic Note  Jake Nguyen is a 57 y.o. male admitted on 10/27/2023 with wound infection.   PMH significant for L AKA and R BKA.  He has recurrent stump site infections and presents with drainage at R stump amputation stump.  Xray of site + progressive periostitis with concern for osteomyelitis.  MRI pending.  Pharmacy has been consulted for Vancomycin  dosing.  Plan: Vancomycin  1gm IV q12h to target AUC 400-550.   Estimated AUC on this regimen = 489.2 Monitor renal function and cx data  Check levels as needed      Temp (24hrs), Avg:98 F (36.7 C), Min:98 F (36.7 C), Max:98 F (36.7 C)  Recent Labs  Lab 10/27/23 2234 10/27/23 2246  WBC 5.3  --   CREATININE 0.47*  --   LATICACIDVEN  --  1.9    Estimated Creatinine Clearance: 97.3 mL/min (A) (by C-G formula based on SCr of 0.47 mg/dL (L)).    Allergies  Allergen Reactions   Pork-Derived Products Other (See Comments)    Pt states he doesn't eat pork    Antimicrobials this admission: 7/18 Rocephin  >>  7/18 Vancomycin  >>   Dose adjustments this admission:  Microbiology results: 7/18 BCx:  7/18 Resp PCR: negative  Thank you for allowing pharmacy to be a part of this patient's care.  Rosaline Millet PharmD 10/28/2023 12:25 AM

## 2023-10-28 NOTE — Hospital Course (Signed)
 Jake Nguyen is a 58 y.o. male with medical history significant for severe alcohol  use disorder, depression/anxiety, homelessness, s/p left AKA (2022), s/p right BKA (08/20/2023) with recurrent stump site infection who is admitted with concern for right BKA infection/osteomyelitis.

## 2023-10-29 DIAGNOSIS — F10920 Alcohol use, unspecified with intoxication, uncomplicated: Secondary | ICD-10-CM | POA: Diagnosis not present

## 2023-10-29 DIAGNOSIS — L089 Local infection of the skin and subcutaneous tissue, unspecified: Secondary | ICD-10-CM | POA: Diagnosis not present

## 2023-10-29 DIAGNOSIS — T148XXA Other injury of unspecified body region, initial encounter: Secondary | ICD-10-CM | POA: Diagnosis not present

## 2023-10-29 DIAGNOSIS — J189 Pneumonia, unspecified organism: Secondary | ICD-10-CM | POA: Diagnosis not present

## 2023-10-29 LAB — CBC
HCT: 36.3 % — ABNORMAL LOW (ref 39.0–52.0)
Hemoglobin: 11.7 g/dL — ABNORMAL LOW (ref 13.0–17.0)
MCH: 28.3 pg (ref 26.0–34.0)
MCHC: 32.2 g/dL (ref 30.0–36.0)
MCV: 87.9 fL (ref 80.0–100.0)
Platelets: 112 K/uL — ABNORMAL LOW (ref 150–400)
RBC: 4.13 MIL/uL — ABNORMAL LOW (ref 4.22–5.81)
RDW: 14.6 % (ref 11.5–15.5)
WBC: 6.4 K/uL (ref 4.0–10.5)
nRBC: 0 % (ref 0.0–0.2)

## 2023-10-29 LAB — COMPREHENSIVE METABOLIC PANEL WITH GFR
ALT: 10 U/L (ref 0–44)
AST: 26 U/L (ref 15–41)
Albumin: 2.7 g/dL — ABNORMAL LOW (ref 3.5–5.0)
Alkaline Phosphatase: 71 U/L (ref 38–126)
Anion gap: 13 (ref 5–15)
BUN: <5 mg/dL — ABNORMAL LOW (ref 6–20)
CO2: 25 mmol/L (ref 22–32)
Calcium: 8.2 mg/dL — ABNORMAL LOW (ref 8.9–10.3)
Chloride: 94 mmol/L — ABNORMAL LOW (ref 98–111)
Creatinine, Ser: 0.31 mg/dL — ABNORMAL LOW (ref 0.61–1.24)
GFR, Estimated: >60 mL/min (ref >60)
Glucose, Bld: 91 mg/dL (ref 70–99)
Potassium: 3.1 mmol/L — ABNORMAL LOW (ref 3.5–5.1)
Sodium: 132 mmol/L — ABNORMAL LOW (ref 135–145)
Total Bilirubin: 0.8 mg/dL (ref 0.0–1.2)
Total Protein: 7.1 g/dL (ref 6.5–8.1)

## 2023-10-29 MED ORDER — POTASSIUM CHLORIDE 10 MEQ/100ML IV SOLN
10.0000 meq | INTRAVENOUS | Status: AC
  Administered 2023-10-29 (×3): 10 meq via INTRAVENOUS
  Filled 2023-10-29 (×3): qty 100

## 2023-10-29 NOTE — Consult Note (Signed)
 WOC Nurse Consult Note: Reason for Consult: stump wound Patient S/P right BKA 08/20/23, patient is homeless with limited access to care. Previous left AKA 2022 Wound type: wound infection and dehiscence  Xray possible osteo;  MRI pending Pressure Injury POA: NA Measurement:see nursing flow sheets Wound bed: medial aspect of wound is yellow and black; appears moist /macerated Drainage (amount, consistency, odor) see nursing flow sheets Periwound: intact Dressing procedure/placement/frequency: Cleanse with saline, pat dry Cover with saline moist gauze dressing. Top with dry dressing Conservative wound care orders pending MRI and orthopedic consultation  Re consult if needed, will not follow at this time. Thanks  Gaylan Fauver [employer], RN,CWOCN, CNS, [employer] [phone]

## 2023-10-29 NOTE — TOC Progression Note (Addendum)
 Transition of Care [facility]) - Progression Note    Patient Details  Name: Jake Nguyen MRN: [id number] Date of Birth: [birth year]  [facility]) CM/SW Contact  Lorraine LILLETTE Fenton, LCSW Phone Number: 10/29/2023, 8:37 AM  Clinical Narrative:    CSW received [place] from RN supporting any help for pt who is homeless at [place].  CSW will speak with pt today and provide the following resource: [facility] / a part of [employer] homeless shelter in [place]. Pt will need to call their hotline to add himself to the waiting list # [phone] He will also need a good call back number when a space becomes available. TOC to follow.   Addendum: CSW visited pt at bedside, appeared well rested, engaged. He does not have a mobile phone, he had been receiving SS disability but has not received a payment in the past couple of months.  He is not sure why. Pt does not have family local, he uses a [facility] center on [address] in [place] for his mail, he reports no letters regarding why SSD payments have stopped. CSW talked about alcohol  education and treatment.  Pt stated he has been in programs before, offered resources and pt declined. Did not want information added to AVS.  CSW provided the printed steps to contact male shelter, [facility]. CSW explained he will need a call back number once he adds his name to the list, suggested a mobile phone from [place] to start.  Pt states he has never used shelter but has been in SNF on [address]. He would be open to SNF placement again.   Pt uses a manual WC for mobility.    Barriers to Discharge: Continued Medical Work up  Expected Discharge Plan and Services                                               Social Determinants of Health (SDOH) Interventions SDOH Screenings   Food Insecurity: Food Insecurity Present (10/28/2023)  Housing: High Risk (10/28/2023)  Transportation Needs: Unmet Transportation Needs  (10/28/2023)  Utilities: Patient Declined (10/28/2023)  Alcohol  Screen: Medium Risk (11/25/2020)  Depression (PHQ2-9): Low Risk  (11/06/2019)  Social Connections: Socially Isolated (07/20/2023)  Tobacco Use: High Risk (10/28/2023)    Readmission Risk Interventions     No data to display

## 2023-10-29 NOTE — Progress Notes (Signed)
 [facility]  PROGRESS NOTE  Jake Nguyen FMW:[id number] DOB: 1966-01-11 DOA: 10/27/2023 PCP: Pcp, No   Brief HPI:    58 y.o. 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  Eventually developed gangrene and underwent right BKA on 08/20/2023 by Dr. Harden.  Was admitted twice since then for stump site infections.  Most recently admitted 6/11-6/20.  Treated with IV vancomycin  and Rocephin  on admission, narrowed to doxycycline  and Ancef , and ultimately transition to oral Augmentin  and doxycycline .  Completed full course of antibiotics in hospital.  Seen by Dr. Harden for some dehiscence who recommended applying shrinkers and follow-up as an outpatient.  Right knee x-ray showed BKA changes with increased soft tissue swelling of the stump.  Slightly progressive periostitis at the cut ends of the distal tibia and fibula with question subtle erosive change at the distal fibular cut margin raising possibility of osteomyelitis.   Portable chest x-ray with streaky infrahilar opacities.   CT head without contrast showed generalized cerebral atrophy and microvascular disease changes of the supratentorial brain.  No acute intracranial abnormality.  Mild right frontal scalp soft tissue swelling.   Patient was given IV vancomycin , ceftriaxone , azithromycin , 1 L LR.  The hospitalist service was consulted to admit.   Assessment/Plan:   S/p right BKA (08/20/2023) with concern for stump infection/osteomyelitis: Right knee x-ray with changes concerning for possible osteomyelitis at the cut ends of distal tibia/fibula.  He is tachycardic but otherwise  hemodynamically stable.  Sepsis not present on admission.  No leukocytosis.  CXR with likely atelectasis rather than pneumonia, hold further azithromycin . - Continue IV vancomycin  and ceftriaxone  - MRI of the right lower extremity done, result is pending -Will consult Dr. Harden based on MRI result -Consult wound care RN   Severe alcohol  use disorder with intoxication: Serum ethanol 460 on admission.  High risk for withdrawal. - Placed on CIWA protocol with Ativan  as needed - Continue Librium  taper    Hypokalemia: Potassium is 3.1 -Replace potassium and follow BMP in am   Thrombocytopenia: Mild and new when compared to previous labs.   Continue to monitor.    Medications     chlordiazePOXIDE   25 mg Oral QID   Followed by   chlordiazePOXIDE   25 mg Oral TID   Followed by   [name] ON 10/30/2023] chlordiazePOXIDE   25 mg Oral BH-qamhs   Followed by   [name] ON 10/31/2023] chlordiazePOXIDE   25 mg Oral Daily   folic acid   1 mg Oral Daily   multivitamin with minerals  1 tablet Oral Daily   sodium chloride  flush  3 mL Intravenous Q12H   thiamine   100 mg Oral Daily   Or   thiamine   100 mg Intravenous Daily     Data Reviewed:   CBG:  Recent Labs  Lab 10/22/23 1323  GLUCAP 104*    SpO2: 100 % O2 Flow Rate (L/min): 2 L/min    Vitals:   10/28/23 0918 10/28/23 1242 10/28/23 2037 10/29/23 0615  BP: (!) 97/58 107/66 124/66 129/63  Pulse: 69 76 79 92  Resp: 20 19 16 18   Temp: 98.2 F (36.8 C) 98.4 F (36.9 C) 99.6 F (37.6 C) 98.6 F (37 C)  TempSrc: Oral Oral Oral Oral  SpO2: 97% 98% 98% 100%  Weight:      Height:          Data Reviewed:  Basic Metabolic Panel: Recent Labs  Lab 10/27/23 2234 10/28/23 0408 10/29/23 0439  NA 140 139 132*  K 3.3* 3.7 3.1*  CL 100 102 94*  CO2 26 27 25   GLUCOSE 111* 102* 91  BUN <5* <5* <5*  CREATININE 0.47* 0.48* 0.31*  CALCIUM  7.8* 7.4* 8.2*    CBC: Recent Labs  Lab 10/27/23 2234 10/28/23 0408 10/29/23 0439   WBC 5.3 5.7 6.4  NEUTROABS 1.2*  --   --   HGB 13.4 10.6* 11.7*  HCT 42.0 32.6* 36.3*  MCV 86.8 87.9 87.9  PLT 135* 108* 112*    LFT Recent Labs  Lab 10/27/23 2234 10/29/23 0439  AST 24 26  ALT 10 10  ALKPHOS 86 71  BILITOT 0.5 0.8  PROT 8.3* 7.1  ALBUMIN 3.0* 2.7*     Antibiotics: Anti-infectives (From admission, onward)    Start     Dose/Rate Route Frequency Ordered Stop   10/28/23 2200  cefTRIAXone  (ROCEPHIN ) 2 g in sodium chloride  0.9 % 100 mL IVPB        2 g 200 mL/hr over 30 Minutes Intravenous Every 24 hours 10/28/23 0000     10/28/23 1000  vancomycin  (VANCOCIN ) IVPB 1000 mg/200 mL premix        1,000 mg 200 mL/hr over 60 Minutes Intravenous Every 12 hours 10/28/23 0041     10/27/23 2330  azithromycin  (ZITHROMAX ) 500 mg in sodium chloride  0.9 % 250 mL IVPB        500 mg 250 mL/hr over 60 Minutes Intravenous  Once 10/27/23 2328 10/28/23 0451   10/27/23 2200  cefTRIAXone  (ROCEPHIN ) 2 g in sodium chloride  0.9 % 100 mL IVPB        2 g 200 mL/hr over 30 Minutes Intravenous Once 10/27/23 2155 10/27/23 2341   10/27/23 2200  vancomycin  (VANCOCIN ) IVPB 1000 mg/200 mL premix        1,000 mg 200 mL/hr over 60 Minutes Intravenous  Once 10/27/23 2155 10/28/23 0112        DVT prophylaxis: Patient has allergy to pork derived products  Code Status: Full code  Family Communication:    CONSULTS    Subjective   Denies pain.   Objective    Physical Examination:   General-appears in no acute distress Heart-S1-S2, regular, no murmur auscultated Lungs-clear to auscultation bilaterally, no wheezing or crackles auscultated Abdomen-soft, nontender, no organomegaly Extremities-right stump wound noted, mild erythema with serosanguineous discharge Neuro-alert, oriented x3, no focal deficit noted    Status is: Inpatient:      Pressure Injury 07/21/23 Buttocks Left Stage 2 -  Partial thickness loss of dermis presenting as a shallow open injury with a red, pink  wound bed without slough. area very dry, flaking.  Small open area (Active)  07/21/23 1000  Location: Buttocks  Location Orientation: Left  Staging: Stage 2 -  Partial thickness loss of dermis presenting as a shallow open injury with a red, pink wound bed without slough.  Wound Description (Comments): area very dry, flaking.  Small open area  DO NOT USE:  Present on Admission: Yes (pt states it is not new)        Fadia Marlar S Yee Gangi   [facility] If 7PM-7AM, please contact night-coverage at [email or web address], Office  [phone]   10/29/2023, 8:22 AM  LOS: 1  day

## 2023-10-30 ENCOUNTER — Inpatient Hospital Stay (HOSPITAL_COMMUNITY): Payer: MEDICAID

## 2023-10-30 DIAGNOSIS — F1020 Alcohol dependence, uncomplicated: Secondary | ICD-10-CM

## 2023-10-30 DIAGNOSIS — L089 Local infection of the skin and subcutaneous tissue, unspecified: Secondary | ICD-10-CM | POA: Diagnosis not present

## 2023-10-30 DIAGNOSIS — E876 Hypokalemia: Secondary | ICD-10-CM

## 2023-10-30 DIAGNOSIS — T148XXA Other injury of unspecified body region, initial encounter: Secondary | ICD-10-CM | POA: Diagnosis not present

## 2023-10-30 LAB — BASIC METABOLIC PANEL WITH GFR
Anion gap: 10 (ref 5–15)
BUN: <5 mg/dL — ABNORMAL LOW (ref 6–20)
CO2: 26 mmol/L (ref 22–32)
Calcium: 8.3 mg/dL — ABNORMAL LOW (ref 8.9–10.3)
Chloride: 98 mmol/L (ref 98–111)
Creatinine, Ser: 0.35 mg/dL — ABNORMAL LOW (ref 0.61–1.24)
GFR, Estimated: >60 mL/min (ref >60)
Glucose, Bld: 127 mg/dL — ABNORMAL HIGH (ref 70–99)
Potassium: 2.8 mmol/L — ABNORMAL LOW (ref 3.5–5.1)
Sodium: 134 mmol/L — ABNORMAL LOW (ref 135–145)

## 2023-10-30 LAB — POTASSIUM: Potassium: 3.5 mmol/L (ref 3.5–5.1)

## 2023-10-30 LAB — MAGNESIUM: Magnesium: 1.6 mg/dL — ABNORMAL LOW (ref 1.7–2.4)

## 2023-10-30 MED ORDER — POTASSIUM CHLORIDE CRYS ER 20 MEQ PO TBCR
40.0000 meq | EXTENDED_RELEASE_TABLET | Freq: Two times a day (BID) | ORAL | Status: AC
  Administered 2023-10-30 – 2023-10-31 (×2): 40 meq via ORAL
  Filled 2023-10-30 (×2): qty 2

## 2023-10-30 MED ORDER — POTASSIUM CHLORIDE 10 MEQ/100ML IV SOLN
10.0000 meq | INTRAVENOUS | Status: AC
  Administered 2023-10-30 (×2): 10 meq via INTRAVENOUS
  Filled 2023-10-30 (×2): qty 100

## 2023-10-30 MED ORDER — TRAZODONE HCL 50 MG PO TABS
50.0000 mg | ORAL_TABLET | Freq: Every day | ORAL | Status: DC
  Administered 2023-10-30 – 2023-11-05 (×7): 50 mg via ORAL
  Filled 2023-10-30 (×7): qty 1

## 2023-10-30 NOTE — Plan of Care (Signed)

## 2023-10-30 NOTE — Progress Notes (Signed)
 PT Cancellation Note  Patient Details Name: Jake Nguyen MRN: [id number] DOB: 07-15-1965   Cancelled Treatment:    Reason Eval/Treat Not Completed: Other (comment) Pt declined therapy this morning due to lethargy.  Request afternoon.  Notified this pt this therapist unlikely to be able to return in pm due to scheduling but will notify team.  Jake Nguyen, PT Acute Rehab [facility] [phone]   Jake Nguyen [place] 10/30/2023, 10:46 AM

## 2023-10-30 NOTE — Progress Notes (Signed)
 [facility]                                                                               Jake Nguyen, is a 58 y.o. male, DOB - 09-08-1965, FMW:[id number] Admit date - 10/27/2023    Outpatient Primary MD for the patient is Pcp, No  LOS - 2  days    Brief summary  58 y.o. 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  Eventually developed gangrene and underwent right BKA on 08/20/2023 by Dr. Harden.  Was admitted twice since then for stump site infections.  Most recently admitted 6/11-6/20.  Treated with IV vancomycin  and Rocephin  on admission, narrowed to doxycycline  and Ancef , and ultimately transition to oral Augmentin  and doxycycline .  Completed full course of antibiotics in hospital.  Seen by Dr. Harden for some dehiscence who recommended applying shrinkers and follow-up as an outpatient.  Right knee x-ray showed BKA changes with increased soft tissue swelling of the stump.  Slightly progressive periostitis at the cut ends of the distal tibia and fibula with question subtle erosive change at the distal fibular cut margin raising possibility of osteomyelitis.   Portable chest x-ray with streaky infrahilar opacities.   CT head without contrast showed generalized cerebral atrophy and microvascular disease changes of the supratentorial brain.  No acute intracranial abnormality.  Mild right frontal scalp soft tissue swelling.   Patient was given IV vancomycin , ceftriaxone , azithromycin , 1 L LR.  The hospitalist service was consulted to admit.   Assessment & Plan    Assessment and Plan:   S/p right BKA with concern  for stump infection/ osteomyelitis:  Right knee x-ray with changes concerning for possible osteomyelitis at the cut ends of distal tibia/fibula.  Patient on IV vancomycin  and IV ceftriaxone .  MRI of the right knee shows Focal T2 hyperintensity within the medullary cavity of the distal tibia without cortical destruction, nonspecific given relatively recent surgery. Recurrent early osteomyelitis not excluded. No other significant osseous findings.    Alcohol  use disorder with intoxication.  On CIWA.    Hypokalemia Replaced, repeat in am.  Check magnesium     Thrombocytopenia Mild, monitor.       Estimated body mass index is 21.29 kg/m as calculated from the following:   Height as of this encounter: 5' 10 (1.778 m).   Weight as of this encounter: 67.3 kg.  Code Status: full code.  DVT Prophylaxis:  scd's   Level of Care: Level of care: Progressive Family Communication: none at bedside.   Disposition Plan:     Remains inpatient appropriate:  pending clinical improvement.   Procedures:  None.   Consultants:   None.   Antimicrobials:   Anti-infectives (From admission, onward)    Start     Dose/Rate Route Frequency Ordered Stop   10/28/23 2200  cefTRIAXone  (ROCEPHIN ) 2 g in sodium chloride  0.9 % 100  mL IVPB        2 g 200 mL/hr over 30 Minutes Intravenous Every 24 hours 10/28/23 0000     10/28/23 1000  vancomycin  (VANCOCIN ) IVPB 1000 mg/200 mL premix        1,000 mg 200 mL/hr over 60 Minutes Intravenous Every 12 hours 10/28/23 0041     10/27/23 2330  azithromycin  (ZITHROMAX ) 500 mg in sodium chloride  0.9 % 250 mL IVPB        500 mg 250 mL/hr over 60 Minutes Intravenous  Once 10/27/23 2328 10/28/23 0451   10/27/23 2200  cefTRIAXone  (ROCEPHIN ) 2 g in sodium chloride  0.9 % 100 mL IVPB        2 g 200 mL/hr over 30 Minutes Intravenous Once 10/27/23 2155 10/27/23 2341   10/27/23 2200  vancomycin  (VANCOCIN ) IVPB 1000 mg/200 mL premix        1,000 mg 200 mL/hr over 60  Minutes Intravenous  Once 10/27/23 2155 10/28/23 0112        Medications  Scheduled Meds:  chlordiazePOXIDE   25 mg Oral TID   Followed by   chlordiazePOXIDE   25 mg Oral BH-qamhs   Followed by   [name] ON 10/31/2023] chlordiazePOXIDE   25 mg Oral Daily   folic acid   1 mg Oral Daily   multivitamin with minerals  1 tablet Oral Daily   sodium chloride  flush  3 mL Intravenous Q12H   thiamine   100 mg Oral Daily   Or   thiamine   100 mg Intravenous Daily   traZODone   50 mg Oral QHS   Continuous Infusions:  cefTRIAXone  (ROCEPHIN )  IV 2 g (10/29/23 2128)   lactated ringers  999 mL/hr at 10/29/23 0756   vancomycin  1,000 mg (10/30/23 0937)   PRN Meds:.acetaminophen  **OR** acetaminophen , loperamide , LORazepam  **OR** LORazepam , ondansetron  **OR** ondansetron  (ZOFRAN ) IV, senna-docusate    Subjective:   Jake Nguyen was seen and examined today.  Wants sleep aid.   Objective:   Vitals:   10/29/23 1319 10/29/23 2041 10/30/23 0629 10/30/23 1317  BP: 116/78 122/62 127/75 114/74  Pulse: 87 99 99 79  Resp: 16 18 18 16   Temp: 98.4 F (36.9 C) 98.1 F (36.7 C) 98.5 F (36.9 C) 98.4 F (36.9 C)  TempSrc: Oral  Oral Oral  SpO2: 98% 99% 94% 96%  Weight:      Height:        Intake/Output Summary (Last 24 hours) at 10/30/2023 1517 Last data filed at 10/30/2023 1100 Gross per 24 hour  Intake 748.15 ml  Output 2750 ml  Net -2001.85 ml   Filed Weights   10/28/23 0704  Weight: 67.3 kg     Exam General: Alert and oriented x 3, NAD Cardiovascular: S1 S2 auscultated, no murmurs, RRR Respiratory: Clear to auscultation bilaterally, no wheezing, rales or rhonchi Gastrointestinal: Soft, nontender, nondistended, + bowel sounds Ext: no pedal edema bilaterally Neuro: AAOx3, Cr N's II- XII. Strength 5/5 upper and lower extremities bilaterally Skin: No rashes Psych: Normal affect and demeanor, alert and oriented x3    Data Reviewed:  I have personally reviewed following labs and imaging  studies   CBC Lab Results  Component Value Date   WBC 6.4 10/29/2023   RBC 4.13 (L) 10/29/2023   HGB 11.7 (L) 10/29/2023   HCT 36.3 (L) 10/29/2023   MCV 87.9 10/29/2023   MCH 28.3 10/29/2023   PLT 112 (L) 10/29/2023   MCHC 32.2 10/29/2023   RDW 14.6 10/29/2023   LYMPHSABS 3.2 10/27/2023   MONOABS 0.4  10/27/2023   EOSABS 0.4 10/27/2023   BASOSABS 0.1 10/27/2023     Last metabolic panel Lab Results  Component Value Date   NA 134 (L) 10/30/2023   K 2.8 (L) 10/30/2023   CL 98 10/30/2023   CO2 26 10/30/2023   BUN <5 (L) 10/30/2023   CREATININE 0.35 (L) 10/30/2023   GLUCOSE 127 (H) 10/30/2023   GFRNONAA >60 10/30/2023   GFRAA >60 11/12/2019   CALCIUM  8.3 (L) 10/30/2023   PHOS 4.3 09/26/2023   PROT 7.1 10/29/2023   ALBUMIN 2.7 (L) 10/29/2023   BILITOT 0.8 10/29/2023   ALKPHOS 71 10/29/2023   AST 26 10/29/2023   ALT 10 10/29/2023   ANIONGAP 10 10/30/2023    CBG (last 3)  No results for input(s): GLUCAP in the last 72 hours.    Coagulation Profile: Recent Labs  Lab 10/27/23 2234  INR 0.9     Radiology Studies: MR KNEE RIGHT WO CONTRAST Result Date: 10/30/2023 CLINICAL DATA:  Recurrent, non healing wound on distal and of stump. History of right below the knee amputation 08/20/2023. EXAM: MRI OF THE RIGHT KNEE WITHOUT CONTRAST TECHNIQUE: Multiplanar, multisequence MR imaging of the knee was performed. No intravenous contrast was administered. COMPARISON:  Radiographs 10/27/2023 and 09/20/2023.  CT 09/04/2023. FINDINGS: Bones/Joint/Cartilage Status post below the knee amputation. Apparent soft tissue wound along the medial aspect of the distal tibial stump, best seen on the coronal images. Focal T2 hyperintensity within the medullary cavity of the distal tibia without cortical destruction. No abnormal signal identified in the distal fibula. The bones are demineralized. No acute osseous findings are seen about the knee. The knee joint is incompletely visualized,  without evidence of significant joint effusion. Ligaments The cruciate and collateral ligaments appear intact at the knee. The menisci appear intact. Muscles and Tendons T2 hyperintensity and atrophy throughout the lower leg musculature, likely related to subacute denervation. The visualized extensor mechanism at the knee appears intact. Soft tissue The peripherally enhancing fluid collection adjacent to the amputation seen on previous CT has resolved. As above, possible soft tissue ulceration along the medial aspect of the distal tibia with underlying inflammatory changes in the soft tissues, but no organized fluid collection. The previously demonstrated well-circumscribed cystic lesion along the medial aspect of the medial femoral condyle is again noted and appears unchanged, measuring up to 2.8 x 1.2 cm on image 5/19. This demonstrates heterogeneous T2 signal and homogeneous intermediate T1 signal. IMPRESSION: 1. Status post below the knee amputation with apparent soft tissue wound along the medial aspect of the distal tibial stump. Underlying soft tissue inflammatory changes without residual or recurrent organized fluid collection to suggest abscess at this time. 2. Focal T2 hyperintensity within the medullary cavity of the distal tibia without cortical destruction, nonspecific given relatively recent surgery. Recurrent early osteomyelitis not excluded. No other significant osseous findings. 3. Stable cystic lesion along the medial aspect of the medial femoral condyle, likely a sebaceous or ganglion cyst. Correlate clinically. 4. T2 hyperintensity and atrophy throughout the lower leg musculature, likely related to subacute denervation. Electronically Signed   By: Elsie Perone M.D.   On: 10/30/2023 11:48       Elgie Butter M.D. [facility] 10/30/2023, 3:17 PM  Available via Epic secure chat 7am-7pm After 7 pm, please refer to night coverage provider listed on amion.

## 2023-10-31 DIAGNOSIS — F1020 Alcohol dependence, uncomplicated: Secondary | ICD-10-CM | POA: Diagnosis not present

## 2023-10-31 DIAGNOSIS — A419 Sepsis, unspecified organism: Secondary | ICD-10-CM

## 2023-10-31 DIAGNOSIS — T148XXA Other injury of unspecified body region, initial encounter: Secondary | ICD-10-CM | POA: Diagnosis not present

## 2023-10-31 DIAGNOSIS — E876 Hypokalemia: Secondary | ICD-10-CM | POA: Diagnosis not present

## 2023-10-31 LAB — BASIC METABOLIC PANEL WITH GFR
Anion gap: 7 (ref 5–15)
BUN: 5 mg/dL — ABNORMAL LOW (ref 6–20)
CO2: 26 mmol/L (ref 22–32)
Calcium: 8.5 mg/dL — ABNORMAL LOW (ref 8.9–10.3)
Chloride: 100 mmol/L (ref 98–111)
Creatinine, Ser: 0.47 mg/dL — ABNORMAL LOW (ref 0.61–1.24)
GFR, Estimated: >60 mL/min (ref >60)
Glucose, Bld: 132 mg/dL — ABNORMAL HIGH (ref 70–99)
Potassium: 3.7 mmol/L (ref 3.5–5.1)
Sodium: 133 mmol/L — ABNORMAL LOW (ref 135–145)

## 2023-10-31 LAB — MAGNESIUM: Magnesium: 1.6 mg/dL — ABNORMAL LOW (ref 1.7–2.4)

## 2023-10-31 MED ORDER — MAGNESIUM SULFATE 2 GM/50ML IV SOLN
2.0000 g | Freq: Once | INTRAVENOUS | Status: AC
  Administered 2023-10-31: 2 g via INTRAVENOUS
  Filled 2023-10-31: qty 50

## 2023-10-31 NOTE — Evaluation (Signed)
 Physical Therapy Evaluation Patient Details Name: Jake Nguyen MRN: [id number] DOB: 1966/04/03 Today's Date: 10/31/2023  History of Present Illness  Pt is 58 yo male admitted on 10/27/23 s/p R BKA with concern for residual limb infection/osteomyelitis and on IV antibiotics.  Pt also with alcohol  intoxication at admission on CIWA protocol.  Pt with hx including but not limited to severe alcohol  use disorder, depression/anxiety, homelessness, s/p left AKA (2022), s/p right BKA (08/20/2023) with recurrent residual limb site infection  Clinical Impression  Pt admitted with above diagnosis.  At baseline, pt was staying on the streets in a tent.  Reports that he always stays in w/c (sleeps in w/c) but can transfer to toilet if needed via scooting.  [facility] but reports didn't work out.  Pt was intoxicated at admission. He has been admitted for recurring residual limb infection and has buttock pressure ulcers per documentation.   Today, pt able to lateral scoot to w/c with supervision for lines, he did need CGA with return to bed due to level of surface. Did educate pt on pressure relief every hour via shifting side to side for a couple mins or lifting buttock - reports he understands and demonstrated.  He would benefit from new w/c cushion - notified TOC.  Pt with poor social situation but does appear at baseline mobility with no acute PT needs or therapy needs at d/c.  Could benefit from LTC if he is agreeable and if possible (barriers exist - EtOH use) in order for safer d/c location and wound management.  PT to sign off as pt at baseline.       If plan is discharge home, recommend the following:     Can travel by private vehicle   No    Equipment Recommendations Wheelchair cushion (measurements PT)  Recommendations for Other Services       Functional Status Assessment Patient has not had a recent decline in their functional status     Precautions /  Restrictions Precautions Precautions: Fall      Mobility  Bed Mobility Overal bed mobility: Independent                  Transfers Overall transfer level: Needs assistance   Transfers: Bed to chair/wheelchair/BSC            Lateral/Scoot Transfers: Supervision General transfer comment: Lateral scoot from bed to chair with supervision for lines.  Going back to bed did need CGA for safety as transfer was uphill.  No physical assist needed    Ambulation/Gait               General Gait Details: non-ambulatory  Stairs            Wheelchair Mobility     Tilt Bed    Modified Rankin (Stroke Patients Only)       Balance Overall balance assessment: Needs assistance Sitting-balance support: No upper extremity supported Sitting balance-Leahy Scale: Good                                       Pertinent Vitals/Pain Pain Assessment Pain Assessment: No/denies pain    Home Living Family/patient expects to be discharged to:: Shelter/Homeless                   Additional Comments: Reports staying in tent - was at [employer]  at some point but reports did not work out    Prior Function Prior Level of Function : Needs assist             Mobility Comments: Pt reports always in his w/c.  Sleeps in tent but in w/c.  Can transfer to toilet.  No prosthetics. ADLs Comments: Reports no issues w/ ADLs though pt not forthcoming with this information     Extremity/Trunk Assessment   Upper Extremity Assessment Upper Extremity Assessment: Overall WFL for tasks assessed    Lower Extremity Assessment Lower Extremity Assessment: LLE deficits/detail;RLE deficits/detail RLE Deficits / Details: Recent R BKA, dressing in place LLE Deficits / Details: L AKA    Cervical / Trunk Assessment Cervical / Trunk Assessment: Normal  Communication        Cognition Arousal: Alert Behavior During Therapy: WFL for tasks assessed/performed    PT - Cognitive impairments: No apparent impairments                                 Cueing       General Comments General comments (skin integrity, edema, etc.): Per documentation pt with pressure injuries on buttock, in addition to residual limb wounds.  Pt reports no longer has pressure relief w/c cushion - will enter recommendation for cushion.  Also, educated on importance of offloading buttock every 1-2 hours by either lifting for 30 sec or shifting side to side for at least 2 mins each    Exercises     Assessment/Plan    PT Assessment Patient does not need any further PT services  PT Problem List         PT Treatment Interventions      PT Goals (Current goals can be found in the Care Plan section)  Acute Rehab PT Goals Patient Stated Goal: not stated PT Goal Formulation: All assessment and education complete, DC therapy    Frequency       Co-evaluation               AM-PAC PT 6 Clicks Mobility  Outcome Measure Help needed turning from your back to your side while in a flat bed without using bedrails?: A Little Help needed moving from lying on your back to sitting on the side of a flat bed without using bedrails?: A Little Help needed moving to and from a bed to a chair (including a wheelchair)?: A Little Help needed standing up from a chair using your arms (e.g., wheelchair or bedside chair)?: Total Help needed to walk in hospital room?: Total Help needed climbing 3-5 steps with a railing? : Total 6 Click Score: 12    End of Session   Activity Tolerance: Patient tolerated treatment well Patient left: in bed;with call bell/phone within reach;with bed alarm set Nurse Communication: Mobility status PT Visit Diagnosis: Other abnormalities of gait and mobility (R26.89)    Time: 8495-8471 PT Time Calculation (min) (ACUTE ONLY): 24 min   Charges:   PT Evaluation $PT Eval Low Complexity: 1 Low PT Treatments $Therapeutic Activity: 8-22  mins PT General Charges $$ ACUTE PT VISIT: 1 Visit         Benjiman, PT Acute Rehab Services [facility] [phone]   [name] 10/31/2023, 4:09 PM

## 2023-10-31 NOTE — Progress Notes (Signed)
 Pharmacy Antibiotic Note  Jake Nguyen is a 58 y.o. male admitted on 10/27/2023 with wound infection. PMH significant for L AKA and R BKA.  He has recurrent stump site infections and presents with drainage at R stump amputation stump.   Day #4 of abx. MRI shows soft tissue wound with no fluid collection but can't exclude early osteo. No growth from blood cultures  Plan: Vancomycin  1gm IV q12h to target AUC 400-550.   Ceftriaxone  2g IV Q24h Monitor clinical picture, renal function, vanc levels prn F/U abx deescalation / LOT  Height: 5' 10 (177.8 cm) Weight: 67.3 kg (148 lb 5.9 oz) IBW/kg (Calculated) : 73  Temp (24hrs), Avg:98.6 F (37 C), Min:98.3 F (36.8 C), Max:99.1 F (37.3 C)  Recent Labs  Lab 10/27/23 2234 10/27/23 2246 10/28/23 0408 10/29/23 0439 10/30/23 0355 10/31/23 0427  WBC 5.3  --  5.7 6.4  --   --   CREATININE 0.47*  --  0.48* 0.31* 0.35* 0.47*  LATICACIDVEN  --  1.9  --   --   --   --     Estimated Creatinine Clearance: 98.1 mL/min (A) (by C-G formula based on SCr of 0.47 mg/dL (L)).    Allergies  Allergen Reactions   Pork-Derived Products Other (See Comments)    Pt states he doesn't eat pork   Antimicrobials this admission: 7/18 Rocephin  >>  7/18 Vancomycin  >>   Dose adjustments this admission:  Microbiology results: 7/18 BCx: ngtd 7/18 Resp PCR: negative  Thank you for allowing pharmacy to be a part of this patient's care.  Rankin Dee, PharmD, BCPS, BCIDP Clinical Pharmacist 10/31/2023 7:55 AM

## 2023-10-31 NOTE — Plan of Care (Signed)

## 2023-10-31 NOTE — Progress Notes (Signed)
 [facility]                                                                               Jake Nguyen, is a 58 y.o. male, DOB - Jun 14, 1965, FMW:[id number] Admit date - 10/27/2023    Outpatient Primary MD for the patient is Pcp, No  LOS - 3  days    Brief summary  58 y.o. 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  Eventually developed gangrene and underwent right BKA on 08/20/2023 by Dr. Harden.  Was admitted twice since then for stump site infections.  Most recently admitted 6/11-6/20.  Treated with IV vancomycin  and Rocephin  on admission, narrowed to doxycycline  and Ancef , and ultimately transition to oral Augmentin  and doxycycline .  Completed full course of antibiotics in hospital.  Seen by Dr. Harden for some dehiscence who recommended applying shrinkers and follow-up as an outpatient.  Right knee x-ray showed BKA changes with increased soft tissue swelling of the stump.  Slightly progressive periostitis at the cut ends of the distal tibia and fibula with question subtle erosive change at the distal fibular cut margin raising possibility of osteomyelitis.   Portable chest x-ray with streaky infrahilar opacities.   CT head without contrast showed generalized cerebral atrophy and microvascular disease changes of the supratentorial brain.  No acute intracranial abnormality.  Mild right frontal scalp soft tissue swelling.   Patient was given IV vancomycin , ceftriaxone , azithromycin , 1 L LR.  The hospitalist service was consulted to admit.   Assessment & Plan    Assessment and Plan:   S/p right BKA with concern  for stump infection/ osteomyelitis:  Right knee x-ray with changes concerning for possible osteomyelitis at the cut ends of distal tibia/fibula.  Patient on IV vancomycin  and IV ceftriaxone .  MRI of the right knee shows Focal T2 hyperintensity within the medullary cavity of the distal tibia without cortical destruction, nonspecific given relatively recent surgery. Recurrent early osteomyelitis not excluded. No other significant osseous findings. Discussed with Dr Harden, recommend to continue with IV antibiotics for now. No osteomyelitis.     Alcohol  use disorder with intoxication.  On CIWA.  No signs of withdrawal.    Hypokalemia Replaced,   Hypomagnesemia Replaced.    Anemia of chronic disease.  Hemoglobin stable around 11.   Mild hyponatremia probably from chronic alcohol  use.     Thrombocytopenia Mild, monitor.       Estimated body mass index is 21.29 kg/m as calculated from the following:   Height as of this encounter: 5' 10 (1.778 m).   Weight as of this encounter: 67.3 kg.  Code Status: full code.  DVT Prophylaxis:  Place and maintain sequential compression device Start: 10/30/23 1520scd's   Level of Care: Level of care: Progressive Family Communication: none at bedside.   Disposition Plan:     Remains inpatient appropriate:  pending clinical improvement.  Procedures:  None.   Consultants:   None.   Antimicrobials:   Anti-infectives (From admission, onward)    Start     Dose/Rate Route Frequency Ordered Stop   10/28/23 2200  cefTRIAXone  (ROCEPHIN ) 2 g in sodium chloride  0.9 % 100 mL IVPB        2 g 200 mL/hr over 30 Minutes Intravenous Every 24 hours 10/28/23 0000     10/28/23 1000  vancomycin  (VANCOCIN ) IVPB 1000 mg/200 mL premix        1,000 mg 200 mL/hr over 60 Minutes Intravenous Every 12 hours 10/28/23 0041     10/27/23 2330  azithromycin  (ZITHROMAX ) 500 mg in sodium chloride  0.9 % 250 mL IVPB        500 mg 250 mL/hr over 60 Minutes  Intravenous  Once 10/27/23 2328 10/28/23 0451   10/27/23 2200  cefTRIAXone  (ROCEPHIN ) 2 g in sodium chloride  0.9 % 100 mL IVPB        2 g 200 mL/hr over 30 Minutes Intravenous Once 10/27/23 2155 10/27/23 2341   10/27/23 2200  vancomycin  (VANCOCIN ) IVPB 1000 mg/200 mL premix        1,000 mg 200 mL/hr over 60 Minutes Intravenous  Once 10/27/23 2155 10/28/23 0112        Medications  Scheduled Meds:  chlordiazePOXIDE   25 mg Oral Daily   folic acid   1 mg Oral Daily   multivitamin with minerals  1 tablet Oral Daily   sodium chloride  flush  3 mL Intravenous Q12H   thiamine   100 mg Oral Daily   Or   thiamine   100 mg Intravenous Daily   traZODone   50 mg Oral QHS   Continuous Infusions:  cefTRIAXone  (ROCEPHIN )  IV 2 g (10/30/23 2044)   lactated ringers  999 mL/hr at 10/29/23 0756   vancomycin  1,000 mg (10/31/23 0920)   PRN Meds:.acetaminophen  **OR** acetaminophen , ondansetron  **OR** ondansetron  (ZOFRAN ) IV, senna-docusate    Subjective:   Jake Nguyen was seen and examined today.  No new complaints today.   Objective:   Vitals:   10/30/23 0629 10/30/23 1317 10/30/23 2034 10/31/23 0608  BP: 127/75 114/74 110/74 107/71  Pulse: 99 79 87 90  Resp: 18 16 16 16   Temp: 98.5 F (36.9 C) 98.4 F (36.9 C) 99.1 F (37.3 C) 98.3 F (36.8 C)  TempSrc: Oral Oral Oral   SpO2: 94% 96% 94% 94%  Weight:      Height:        Intake/Output Summary (Last 24 hours) at 10/31/2023 1443 Last data filed at 10/31/2023 0053 Gross per 24 hour  Intake 1739.94 ml  Output 1700 ml  Net 39.94 ml   Filed Weights   10/28/23 0704  Weight: 67.3 kg     Exam General exam: Appears calm and comfortable  Respiratory system: Clear to auscultation. Respiratory effort normal. Cardiovascular system: S1 & S2 heard, RRR. No JVD,  Gastrointestinal system: Abdomen is nondistended, soft and nontender.  Central nervous system: Alert and oriented.  Extremities: right BKA, stump is bandaged.  Skin: No  rashes Psychiatry: Mood & affect appropriate.     Data Reviewed:  I have personally reviewed following labs and imaging studies   CBC Lab Results  Component Value Date   WBC 6.4 10/29/2023   RBC 4.13 (L) 10/29/2023   HGB 11.7 (L) 10/29/2023   HCT 36.3 (L) 10/29/2023   MCV 87.9 10/29/2023   MCH 28.3 10/29/2023   PLT 112 (L) 10/29/2023   MCHC 32.2 10/29/2023  RDW 14.6 10/29/2023   LYMPHSABS 3.2 10/27/2023   MONOABS 0.4 10/27/2023   EOSABS 0.4 10/27/2023   BASOSABS 0.1 10/27/2023     Last metabolic panel Lab Results  Component Value Date   NA 133 (L) 10/31/2023   K 3.7 10/31/2023   CL 100 10/31/2023   CO2 26 10/31/2023   BUN 5 (L) 10/31/2023   CREATININE 0.47 (L) 10/31/2023   GLUCOSE 132 (H) 10/31/2023   GFRNONAA >60 10/31/2023   GFRAA >60 11/12/2019   CALCIUM  8.5 (L) 10/31/2023   PHOS 4.3 09/26/2023   PROT 7.1 10/29/2023   ALBUMIN 2.7 (L) 10/29/2023   BILITOT 0.8 10/29/2023   ALKPHOS 71 10/29/2023   AST 26 10/29/2023   ALT 10 10/29/2023   ANIONGAP 7 10/31/2023    CBG (last 3)  No results for input(s): GLUCAP in the last 72 hours.    Coagulation Profile: Recent Labs  Lab 10/27/23 2234  INR 0.9     Radiology Studies: MR KNEE RIGHT WO CONTRAST Result Date: 10/30/2023 CLINICAL DATA:  Recurrent, non healing wound on distal and of stump. History of right below the knee amputation 08/20/2023. EXAM: MRI OF THE RIGHT KNEE WITHOUT CONTRAST TECHNIQUE: Multiplanar, multisequence MR imaging of the knee was performed. No intravenous contrast was administered. COMPARISON:  Radiographs 10/27/2023 and 09/20/2023.  CT 09/04/2023. FINDINGS: Bones/Joint/Cartilage Status post below the knee amputation. Apparent soft tissue wound along the medial aspect of the distal tibial stump, best seen on the coronal images. Focal T2 hyperintensity within the medullary cavity of the distal tibia without cortical destruction. No abnormal signal identified in the distal fibula. The bones  are demineralized. No acute osseous findings are seen about the knee. The knee joint is incompletely visualized, without evidence of significant joint effusion. Ligaments The cruciate and collateral ligaments appear intact at the knee. The menisci appear intact. Muscles and Tendons T2 hyperintensity and atrophy throughout the lower leg musculature, likely related to subacute denervation. The visualized extensor mechanism at the knee appears intact. Soft tissue The peripherally enhancing fluid collection adjacent to the amputation seen on previous CT has resolved. As above, possible soft tissue ulceration along the medial aspect of the distal tibia with underlying inflammatory changes in the soft tissues, but no organized fluid collection. The previously demonstrated well-circumscribed cystic lesion along the medial aspect of the medial femoral condyle is again noted and appears unchanged, measuring up to 2.8 x 1.2 cm on image 5/19. This demonstrates heterogeneous T2 signal and homogeneous intermediate T1 signal. IMPRESSION: 1. Status post below the knee amputation with apparent soft tissue wound along the medial aspect of the distal tibial stump. Underlying soft tissue inflammatory changes without residual or recurrent organized fluid collection to suggest abscess at this time. 2. Focal T2 hyperintensity within the medullary cavity of the distal tibia without cortical destruction, nonspecific given relatively recent surgery. Recurrent early osteomyelitis not excluded. No other significant osseous findings. 3. Stable cystic lesion along the medial aspect of the medial femoral condyle, likely a sebaceous or ganglion cyst. Correlate clinically. 4. T2 hyperintensity and atrophy throughout the lower leg musculature, likely related to subacute denervation. Electronically Signed   By: Elsie Perone M.D.   On: 10/30/2023 11:48       Elgie Butter M.D. [facility] 10/31/2023, 2:43 PM  Available via Epic  secure chat 7am-7pm After 7 pm, please refer to night coverage provider listed on amion.

## 2023-11-01 DIAGNOSIS — E876 Hypokalemia: Secondary | ICD-10-CM | POA: Diagnosis not present

## 2023-11-01 DIAGNOSIS — A419 Sepsis, unspecified organism: Secondary | ICD-10-CM | POA: Diagnosis not present

## 2023-11-01 DIAGNOSIS — F1020 Alcohol dependence, uncomplicated: Secondary | ICD-10-CM | POA: Diagnosis not present

## 2023-11-01 DIAGNOSIS — T148XXA Other injury of unspecified body region, initial encounter: Secondary | ICD-10-CM | POA: Diagnosis not present

## 2023-11-01 LAB — BASIC METABOLIC PANEL WITH GFR
Anion gap: 10 (ref 5–15)
BUN: 6 mg/dL (ref 6–20)
CO2: 22 mmol/L (ref 22–32)
Calcium: 8.6 mg/dL — ABNORMAL LOW (ref 8.9–10.3)
Chloride: 99 mmol/L (ref 98–111)
Creatinine, Ser: 0.52 mg/dL — ABNORMAL LOW (ref 0.61–1.24)
GFR, Estimated: >60 mL/min (ref >60)
Glucose, Bld: 132 mg/dL — ABNORMAL HIGH (ref 70–99)
Potassium: 3.5 mmol/L (ref 3.5–5.1)
Sodium: 131 mmol/L — ABNORMAL LOW (ref 135–145)

## 2023-11-01 LAB — CBC WITH DIFFERENTIAL/PLATELET
Abs Immature Granulocytes: 0.01 K/uL (ref 0.00–0.07)
Basophils Absolute: 0.1 K/uL (ref 0.0–0.1)
Basophils Relative: 1 %
Eosinophils Absolute: 0.4 K/uL (ref 0.0–0.5)
Eosinophils Relative: 8 %
HCT: 37.6 % — ABNORMAL LOW (ref 39.0–52.0)
Hemoglobin: 11.6 g/dL — ABNORMAL LOW (ref 13.0–17.0)
Immature Granulocytes: 0 %
Lymphocytes Relative: 45 %
Lymphs Abs: 2.1 K/uL (ref 0.7–4.0)
MCH: 28.3 pg (ref 26.0–34.0)
MCHC: 30.9 g/dL (ref 30.0–36.0)
MCV: 91.7 fL (ref 80.0–100.0)
Monocytes Absolute: 0.6 K/uL (ref 0.1–1.0)
Monocytes Relative: 12 %
Neutro Abs: 1.6 K/uL — ABNORMAL LOW (ref 1.7–7.7)
Neutrophils Relative %: 34 %
Platelets: 162 K/uL (ref 150–400)
RBC: 4.1 MIL/uL — ABNORMAL LOW (ref 4.22–5.81)
RDW: 14.7 % (ref 11.5–15.5)
WBC: 4.8 K/uL (ref 4.0–10.5)
nRBC: 0 % (ref 0.0–0.2)

## 2023-11-01 LAB — MAGNESIUM: Magnesium: 1.8 mg/dL (ref 1.7–2.4)

## 2023-11-01 NOTE — Plan of Care (Signed)

## 2023-11-01 NOTE — Progress Notes (Signed)
 [facility]                                                                               Jake Nguyen, is a 58 y.o. male, DOB - [birth year], FMW:[id number] Admit date - 10/27/2023    Outpatient Primary MD for the patient is Pcp, No  LOS - 4  days    Brief summary  58 y.o. 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  Eventually developed gangrene and underwent right BKA on 08/20/2023 by Dr. Harden.  Was admitted twice since then for stump site infections.  Most recently admitted 6/11-6/20.  Treated with IV vancomycin  and Rocephin  on admission, narrowed to doxycycline  and Ancef , and ultimately transition to oral Augmentin  and doxycycline .  Completed full course of antibiotics in hospital.  Seen by Dr. Harden for some dehiscence who recommended applying shrinkers and follow-up as an outpatient.  Right knee x-ray showed BKA changes with increased soft tissue swelling of the stump.  Slightly progressive periostitis at the cut ends of the distal tibia and fibula with question subtle erosive change at the distal fibular cut margin raising possibility of osteomyelitis.   Portable chest x-ray with streaky infrahilar opacities.   CT head without contrast showed generalized cerebral atrophy and microvascular disease changes of the supratentorial brain.  No acute intracranial abnormality.  Mild right frontal scalp soft tissue swelling.   Patient was given IV vancomycin , ceftriaxone , azithromycin , 1 L LR.  The hospitalist service was consulted to admit.   Assessment & Plan    Assessment and Plan:   S/p right BKA with concern  for stump infection/ osteomyelitis:  Right knee x-ray with changes concerning for possible osteomyelitis at the cut ends of distal tibia/fibula.  Patient on IV vancomycin  and IV ceftriaxone .  MRI of the right knee shows Focal T2 hyperintensity within the medullary cavity of the distal tibia without cortical destruction, nonspecific given relatively recent surgery. Recurrent early osteomyelitis not excluded. No other significant osseous findings. Discussed with Dr Harden, recommend to continue with IV antibiotics for now. No osteomyelitis.  Completed 5 days of IV antibiotics, will plan for another 2 days of IV Antibiotics and transition to oral antibiotics.     Alcohol  use disorder with intoxication.  On CIWA.  No signs of withdrawal.    Hypokalemia Replaced, repeat levels wnl.   Hypomagnesemia Replaced. Repeat level wnl.    Anemia of chronic disease.  Hemoglobin stable around 11.   Mild hyponatremia probably from chronic alcohol  use.  Asymptomatic.    Thrombocytopenia Mild, monitor.       Estimated body mass index is 21.29 kg/m as calculated from the following:   Height as of this encounter: 5' 10 (1.778 m).   Weight as of this encounter: 67.3 kg.  Code Status: full code.  DVT Prophylaxis:  Place and maintain sequential compression device Start: 10/30/23 1520scd's  Level of Care: Level of care: Progressive Family Communication: none at bedside.   Disposition Plan:     Remains inpatient appropriate:  pending clinical improvement.   Procedures:  None.   Consultants:   None.   Antimicrobials:   Anti-infectives (From admission, onward)    Start     Dose/Rate Route Frequency Ordered Stop   10/28/23 2200  cefTRIAXone  (ROCEPHIN ) 2 g in sodium chloride  0.9 % 100 mL IVPB        2 g 200 mL/hr over 30 Minutes Intravenous Every 24 hours 10/28/23 0000     10/28/23 1000  vancomycin  (VANCOCIN ) IVPB 1000 mg/200 mL premix        1,000 mg 200 mL/hr over 60 Minutes  Intravenous Every 12 hours 10/28/23 0041     10/27/23 2330  azithromycin  (ZITHROMAX ) 500 mg in sodium chloride  0.9 % 250 mL IVPB        500 mg 250 mL/hr over 60 Minutes Intravenous  Once 10/27/23 2328 10/28/23 0451   10/27/23 2200  cefTRIAXone  (ROCEPHIN ) 2 g in sodium chloride  0.9 % 100 mL IVPB        2 g 200 mL/hr over 30 Minutes Intravenous Once 10/27/23 2155 10/27/23 2341   10/27/23 2200  vancomycin  (VANCOCIN ) IVPB 1000 mg/200 mL premix        1,000 mg 200 mL/hr over 60 Minutes Intravenous  Once 10/27/23 2155 10/28/23 0112        Medications  Scheduled Meds:  folic acid   1 mg Oral Daily   multivitamin with minerals  1 tablet Oral Daily   sodium chloride  flush  3 mL Intravenous Q12H   thiamine   100 mg Oral Daily   Or   thiamine   100 mg Intravenous Daily   traZODone   50 mg Oral QHS   Continuous Infusions:  cefTRIAXone  (ROCEPHIN )  IV 2 g (10/31/23 2059)   lactated ringers  999 mL/hr at 10/29/23 0756   vancomycin  1,000 mg (11/01/23 1007)   PRN Meds:.acetaminophen  **OR** acetaminophen , ondansetron  **OR** ondansetron  (ZOFRAN ) IV, senna-docusate    Subjective:   Jake Nguyen was seen and examined today.  Reports feeling weakn.   Objective:   Vitals:   11/01/23 0526 11/01/23 1000 11/01/23 1227 11/01/23 1355  BP: 107/74 106/72 109/74 112/76  Pulse: 95 92 82 89  Resp: 20  16   Temp: 98.4 F (36.9 C)  (!) 97.5 F (36.4 C)   TempSrc: Oral  Oral   SpO2: 96%  97%   Weight:      Height:        Intake/Output Summary (Last 24 hours) at 11/01/2023 1517 Last data filed at 11/01/2023 1504 Gross per 24 hour  Intake 1030 ml  Output 1650 ml  Net -620 ml   Filed Weights   10/28/23 0704  Weight: 67.3 kg     Exam General exam: Appears calm and comfortable  Respiratory system: Clear to auscultation. Respiratory effort normal. Cardiovascular system: S1 & S2 heard, RRR. No JVD,  Gastrointestinal system: Abdomen is nondistended, soft and nontender.  Central nervous  system: Alert and oriented. Extremities: right BKA, left AKA.  Skin: No rashes,  Psychiatry: Mood & affect appropriate.      Data Reviewed:  I have personally reviewed following labs and imaging studies   CBC Lab Results  Component Value Date   WBC 4.8 11/01/2023   RBC 4.10 (L) 11/01/2023   HGB 11.6 (L) 11/01/2023   HCT 37.6 (L) 11/01/2023   MCV 91.7 11/01/2023   MCH  28.3 11/01/2023   PLT 162 11/01/2023   MCHC 30.9 11/01/2023   RDW 14.7 11/01/2023   LYMPHSABS 2.1 11/01/2023   MONOABS 0.6 11/01/2023   EOSABS 0.4 11/01/2023   BASOSABS 0.1 11/01/2023     Last metabolic panel Lab Results  Component Value Date   NA 131 (L) 11/01/2023   K 3.5 11/01/2023   CL 99 11/01/2023   CO2 22 11/01/2023   BUN 6 11/01/2023   CREATININE 0.52 (L) 11/01/2023   GLUCOSE 132 (H) 11/01/2023   GFRNONAA >60 11/01/2023   GFRAA >60 11/12/2019   CALCIUM  8.6 (L) 11/01/2023   PHOS 4.3 09/26/2023   PROT 7.1 10/29/2023   ALBUMIN 2.7 (L) 10/29/2023   BILITOT 0.8 10/29/2023   ALKPHOS 71 10/29/2023   AST 26 10/29/2023   ALT 10 10/29/2023   ANIONGAP 10 11/01/2023    CBG (last 3)  No results for input(s): GLUCAP in the last 72 hours.    Coagulation Profile: Recent Labs  Lab 10/27/23 2234  INR 0.9     Radiology Studies: No results found.      Elgie Butter M.D. [facility] 11/01/2023, 3:17 PM  Available via Epic secure chat 7am-7pm After 7 pm, please refer to night coverage provider listed on amion.

## 2023-11-02 DIAGNOSIS — E876 Hypokalemia: Secondary | ICD-10-CM | POA: Diagnosis not present

## 2023-11-02 DIAGNOSIS — A419 Sepsis, unspecified organism: Secondary | ICD-10-CM | POA: Diagnosis not present

## 2023-11-02 DIAGNOSIS — T148XXA Other injury of unspecified body region, initial encounter: Secondary | ICD-10-CM | POA: Diagnosis not present

## 2023-11-02 DIAGNOSIS — F10920 Alcohol use, unspecified with intoxication, uncomplicated: Secondary | ICD-10-CM | POA: Diagnosis not present

## 2023-11-02 LAB — CULTURE, BLOOD (ROUTINE X 2)
Culture: NO GROWTH
Culture: NO GROWTH
Special Requests: ADEQUATE

## 2023-11-02 MED ORDER — HYDROCORTISONE 1 % EX CREA
TOPICAL_CREAM | Freq: Four times a day (QID) | CUTANEOUS | Status: DC
  Filled 2023-11-02: qty 28

## 2023-11-02 NOTE — TOC Progression Note (Signed)
 Transition of Care [facility]) - Progression Note    Patient Details  Name: ANDRA MATSUO MRN: [id number] Date of Birth: June 06, 1965  Transition of Care [facility]) CM/SW Contact  Tawni CHRISTELLA Eva, LCSW Phone Number: 11/02/2023, 3:25 PM  Clinical Narrative:     Pt rec for new wheelchair cushion, CSW made referral to [employer] for cushion be delivered to pt's room prior to d/c. Care management to follow.    Barriers to Discharge: Continued Medical Work up               Expected Discharge Plan and Services                                               Social Drivers of Health (SDOH) Interventions SDOH Screenings   Food Insecurity: Food Insecurity Present (10/28/2023)  Housing: High Risk (10/28/2023)  Transportation Needs: Unmet Transportation Needs (10/28/2023)  Utilities: Patient Declined (10/28/2023)  Alcohol  Screen: Medium Risk (11/25/2020)  Depression (PHQ2-9): Low Risk  (11/06/2019)  Social Connections: Socially Isolated (07/20/2023)  Tobacco Use: High Risk (10/28/2023)    Readmission Risk Interventions     No data to display

## 2023-11-02 NOTE — Progress Notes (Signed)
 [facility]                                                                               Jake Nguyen, is a 58 y.o. male, DOB - 1965/05/09, FMW:[id number] Admit date - 10/27/2023    Outpatient Primary MD for the patient is Pcp, No  LOS - 5  days    Brief summary  58 y.o. 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  Eventually developed gangrene and underwent right BKA on 08/20/2023 by Dr. Harden.  Was admitted twice since then for stump site infections.  Most recently admitted 6/11-6/20.  Treated with IV vancomycin  and Rocephin  on admission, narrowed to doxycycline  and Ancef , and ultimately transition to oral Augmentin  and doxycycline .  Completed full course of antibiotics in hospital.  Seen by Dr. Harden for some dehiscence who recommended applying shrinkers and follow-up as an outpatient.  Right knee x-ray showed BKA changes with increased soft tissue swelling of the stump.  Slightly progressive periostitis at the cut ends of the distal tibia and fibula with question subtle erosive change at the distal fibular cut margin raising possibility of osteomyelitis.   Portable chest x-ray with streaky infrahilar opacities.   CT head without contrast showed generalized cerebral atrophy and microvascular disease changes of the supratentorial brain.  No acute intracranial abnormality.  Mild right frontal scalp soft tissue swelling.   Patient was given IV vancomycin , ceftriaxone , azithromycin , 1 L LR.  The hospitalist service was consulted to admit.   Assessment & Plan    Assessment and Plan:   S/p right BKA with concern  for stump infection/ osteomyelitis:  Right knee x-ray with changes concerning for possible osteomyelitis at the cut ends of distal tibia/fibula.  Patient on IV vancomycin  and IV ceftriaxone .  MRI of the right knee shows Focal T2 hyperintensity within the medullary cavity of the distal tibia without cortical destruction, nonspecific given relatively recent surgery. Recurrent early osteomyelitis not excluded. No other significant osseous findings. Discussed with Dr Harden, recommend to continue with IV antibiotics for now. No osteomyelitis.  Complete 7 days of IV antibiotics followed by 7 days of     Alcohol  use disorder with intoxication.  On CIWA.  No signs of withdrawal.    Hypokalemia Replaced, repeat levels wnl.   Hypomagnesemia Replaced. Repeat level wnl.    Anemia of chronic disease.  Hemoglobin stable around 11.   Mild hyponatremia probably from chronic alcohol  use.  Asymptomatic.    Thrombocytopenia Mild, monitor.       Estimated body mass index is 21.29 kg/m as calculated from the following:   Height as of this encounter: 5' 10 (1.778 m).   Weight as of this encounter: 67.3 kg.  Code Status: full code.  DVT Prophylaxis:  Place and maintain sequential compression device Start: 10/30/23 1520scd's   Level of Care: Level of care: Progressive Family  Communication: none at bedside.   Disposition Plan:     Remains inpatient appropriate:  pending clinical improvement.   Procedures:  None.   Consultants:   None.   Antimicrobials:   Anti-infectives (From admission, onward)    Start     Dose/Rate Route Frequency Ordered Stop   10/28/23 2200  cefTRIAXone  (ROCEPHIN ) 2 g in sodium chloride  0.9 % 100 mL IVPB        2 g 200 mL/hr over 30 Minutes Intravenous Every 24 hours 10/28/23 0000 11/04/23 2159   10/28/23 1000  vancomycin  (VANCOCIN ) IVPB 1000 mg/200 mL premix        1,000 mg 200 mL/hr over 60 Minutes Intravenous Every 12 hours 10/28/23 0041 11/04/23 0959    10/27/23 2330  azithromycin  (ZITHROMAX ) 500 mg in sodium chloride  0.9 % 250 mL IVPB        500 mg 250 mL/hr over 60 Minutes Intravenous  Once 10/27/23 2328 10/28/23 0451   10/27/23 2200  cefTRIAXone  (ROCEPHIN ) 2 g in sodium chloride  0.9 % 100 mL IVPB        2 g 200 mL/hr over 30 Minutes Intravenous Once 10/27/23 2155 10/27/23 2341   10/27/23 2200  vancomycin  (VANCOCIN ) IVPB 1000 mg/200 mL premix        1,000 mg 200 mL/hr over 60 Minutes Intravenous  Once 10/27/23 2155 10/28/23 0112        Medications  Scheduled Meds:  folic acid   1 mg Oral Daily   hydrocortisone  cream   Topical QID   multivitamin with minerals  1 tablet Oral Daily   sodium chloride  flush  3 mL Intravenous Q12H   thiamine   100 mg Oral Daily   Or   thiamine   100 mg Intravenous Daily   traZODone   50 mg Oral QHS   Continuous Infusions:  cefTRIAXone  (ROCEPHIN )  IV 2 g (11/01/23 2113)   lactated ringers  999 mL/hr at 11/01/23 1540   vancomycin  1,000 mg (11/02/23 0916)   PRN Meds:.acetaminophen  **OR** acetaminophen , ondansetron  **OR** ondansetron  (ZOFRAN ) IV, senna-docusate    Subjective:   Jake Nguyen was seen and examined today. Discussed plan with the patient .   Objective:   Vitals:   11/01/23 1800 11/01/23 2118 11/02/23 0406 11/02/23 1129  BP: 110/77 108/76 117/79 102/67  Pulse: 88 (!) 103 92 89  Resp:  20 20 19   Temp:  98.1 F (36.7 C) 98.4 F (36.9 C) 98.5 F (36.9 C)  TempSrc:  Oral Oral Oral  SpO2:  96% 98% 99%  Weight:      Height:        Intake/Output Summary (Last 24 hours) at 11/02/2023 1721 Last data filed at 11/02/2023 1510 Gross per 24 hour  Intake 600 ml  Output 2200 ml  Net -1600 ml   Filed Weights   10/28/23 0704  Weight: 67.3 kg     Exam General exam: Appears calm and comfortable  Respiratory system: Clear to auscultation. Respiratory effort normal. Cardiovascular system: S1 & S2 heard, RRR.  Gastrointestinal system: Abdomen is nondistended, soft and nontender.   Central nervous system: Alert and oriented. Extremities: BKA on the right , Skin: excoriations of the skin.  Psychiatry:  Mood & affect appropriate.       Data Reviewed:  I have personally reviewed following labs and imaging studies   CBC Lab Results  Component Value Date   WBC 4.8 11/01/2023   RBC 4.10 (L) 11/01/2023   HGB 11.6 (L) 11/01/2023   HCT 37.6 (L) 11/01/2023  MCV 91.7 11/01/2023   MCH 28.3 11/01/2023   PLT 162 11/01/2023   MCHC 30.9 11/01/2023   RDW 14.7 11/01/2023   LYMPHSABS 2.1 11/01/2023   MONOABS 0.6 11/01/2023   EOSABS 0.4 11/01/2023   BASOSABS 0.1 11/01/2023     Last metabolic panel Lab Results  Component Value Date   NA 131 (L) 11/01/2023   K 3.5 11/01/2023   CL 99 11/01/2023   CO2 22 11/01/2023   BUN 6 11/01/2023   CREATININE 0.52 (L) 11/01/2023   GLUCOSE 132 (H) 11/01/2023   GFRNONAA >60 11/01/2023   GFRAA >60 11/12/2019   CALCIUM  8.6 (L) 11/01/2023   PHOS 4.3 09/26/2023   PROT 7.1 10/29/2023   ALBUMIN 2.7 (L) 10/29/2023   BILITOT 0.8 10/29/2023   ALKPHOS 71 10/29/2023   AST 26 10/29/2023   ALT 10 10/29/2023   ANIONGAP 10 11/01/2023    CBG (last 3)  No results for input(s): GLUCAP in the last 72 hours.    Coagulation Profile: Recent Labs  Lab 10/27/23 2234  INR 0.9     Radiology Studies: No results found.      Elgie Butter M.D. [facility] 11/02/2023, 5:21 PM  Available via Epic secure chat 7am-7pm After 7 pm, please refer to night coverage provider listed on amion.

## 2023-11-02 NOTE — Plan of Care (Signed)
   Problem: Education: Goal: Knowledge of General Education information will improve Description: Including pain rating scale, medication(s)/side effects and non-pharmacologic comfort measures Outcome: Progressing   Problem: Coping: Goal: Level of anxiety will decrease Outcome: Progressing   Problem: Safety: Goal: Ability to remain free from injury will improve Outcome: Progressing

## 2023-11-03 DIAGNOSIS — A419 Sepsis, unspecified organism: Secondary | ICD-10-CM | POA: Diagnosis not present

## 2023-11-03 DIAGNOSIS — T148XXA Other injury of unspecified body region, initial encounter: Secondary | ICD-10-CM | POA: Diagnosis not present

## 2023-11-03 DIAGNOSIS — E876 Hypokalemia: Secondary | ICD-10-CM | POA: Diagnosis not present

## 2023-11-03 DIAGNOSIS — F10920 Alcohol use, unspecified with intoxication, uncomplicated: Secondary | ICD-10-CM | POA: Diagnosis not present

## 2023-11-03 MED ORDER — DOXYCYCLINE HYCLATE 100 MG PO TABS
100.0000 mg | ORAL_TABLET | Freq: Two times a day (BID) | ORAL | Status: DC
  Administered 2023-11-04 – 2023-11-06 (×5): 100 mg via ORAL
  Filled 2023-11-03 (×5): qty 1

## 2023-11-03 MED ORDER — AMOXICILLIN-POT CLAVULANATE 875-125 MG PO TABS
1.0000 | ORAL_TABLET | Freq: Two times a day (BID) | ORAL | Status: DC
  Administered 2023-11-04 – 2023-11-06 (×5): 1 via ORAL
  Filled 2023-11-03 (×5): qty 1

## 2023-11-03 NOTE — Progress Notes (Signed)
 [facility]                                                                               Jake Nguyen, is a 58 y.o. male, DOB - November 06, 1965, FMW:[id number] Admit date - 10/27/2023    Outpatient Primary MD for the patient is Pcp, No  LOS - 6  days    Brief summary  58 y.o. 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  Eventually developed gangrene and underwent right BKA on 08/20/2023 by Dr. Harden.  Was admitted twice since then for stump site infections.  Most recently admitted 6/11-6/20.  Treated with IV vancomycin  and Rocephin  on admission, narrowed to doxycycline  and Ancef , and ultimately transition to oral Augmentin  and doxycycline .  Completed full course of antibiotics in hospital.  Seen by Dr. Harden for some dehiscence who recommended applying shrinkers and follow-up as an outpatient.  Right knee x-ray showed BKA changes with increased soft tissue swelling of the stump.  Slightly progressive periostitis at the cut ends of the distal tibia and fibula with question subtle erosive change at the distal fibular cut margin raising possibility of osteomyelitis.   Portable chest x-ray with streaky infrahilar opacities.   CT head without contrast showed generalized cerebral atrophy and microvascular disease changes of the supratentorial brain.  No acute intracranial abnormality.  Mild right frontal scalp soft tissue swelling.   Patient was given IV vancomycin , ceftriaxone , azithromycin , 1 L LR.  The hospitalist service was consulted to admit. Plan to complete 7 days of IV antibiotics followed by another 7 day sof oral  antibiotics.  Toc consulted for disposition.  He was supposed to call the [employer] and is currently on their waitlist.    Assessment & Plan    Assessment and Plan:   S/p right BKA with concern for stump infection/ osteomyelitis:  Right knee x-ray with changes concerning for possible osteomyelitis at the cut ends of distal tibia/fibula.  Patient on IV vancomycin  and IV ceftriaxone .  MRI of the right knee shows Focal T2 hyperintensity within the medullary cavity of the distal tibia without cortical destruction, nonspecific given relatively recent surgery. Recurrent early osteomyelitis not excluded. No other significant osseous findings. Discussed with Dr Harden, recommend to continue with IV antibiotics for now. No osteomyelitis.  Complete 7 days of IV antibiotics followed by 7 days of oral antibiotics.     Alcohol  use disorder with intoxication.  On CIWA.  No signs of withdrawal.    Hypokalemia Replaced, repeat levels wnl.   Hypomagnesemia Replaced. Repeat level wnl.    Anemia of chronic disease.  Hemoglobin stable around 11.   Mild hyponatremia probably from chronic alcohol  use.  Asymptomatic. Sodium of 131.    Thrombocytopenia Mild, monitor.       Estimated body mass index is 21.29 kg/m as calculated from the following:   Height as of this  encounter: 5' 10 (1.778 m).   Weight as of this encounter: 67.3 kg.  Code Status: full code.  DVT Prophylaxis:  Place and maintain sequential compression device Start: 10/30/23 1520scd's   Level of Care: Level of care: Progressive Family Communication: none at bedside.   Disposition Plan:     Remains inpatient appropriate:  pending clinical improvement.   Procedures:  None.   Consultants:   None.   Antimicrobials:   Anti-infectives (From admission, onward)    Start     Dose/Rate Route Frequency Ordered Stop   11/04/23 1000  amoxicillin -clavulanate (AUGMENTIN ) 875-125 MG per tablet 1 tablet        1 tablet  Oral Every 12 hours 11/03/23 1057 11/11/23 0959   11/04/23 1000  doxycycline  (VIBRA -TABS) tablet 100 mg        100 mg Oral Every 12 hours 11/03/23 1057 11/11/23 0959   10/28/23 2200  cefTRIAXone  (ROCEPHIN ) 2 g in sodium chloride  0.9 % 100 mL IVPB        2 g 200 mL/hr over 30 Minutes Intravenous Every 24 hours 10/28/23 0000 11/04/23 2159   10/28/23 1000  vancomycin  (VANCOCIN ) IVPB 1000 mg/200 mL premix        1,000 mg 200 mL/hr over 60 Minutes Intravenous Every 12 hours 10/28/23 0041 11/04/23 0959   10/27/23 2330  azithromycin  (ZITHROMAX ) 500 mg in sodium chloride  0.9 % 250 mL IVPB        500 mg 250 mL/hr over 60 Minutes Intravenous  Once 10/27/23 2328 10/28/23 0451   10/27/23 2200  cefTRIAXone  (ROCEPHIN ) 2 g in sodium chloride  0.9 % 100 mL IVPB        2 g 200 mL/hr over 30 Minutes Intravenous Once 10/27/23 2155 10/27/23 2341   10/27/23 2200  vancomycin  (VANCOCIN ) IVPB 1000 mg/200 mL premix        1,000 mg 200 mL/hr over 60 Minutes Intravenous  Once 10/27/23 2155 10/28/23 0112        Medications  Scheduled Meds:  [START ON 11/04/2023] amoxicillin -clavulanate  1 tablet Oral Q12H   [START ON 11/04/2023] doxycycline   100 mg Oral Q12H   folic acid   1 mg Oral Daily   hydrocortisone  cream   Topical QID   multivitamin with minerals  1 tablet Oral Daily   sodium chloride  flush  3 mL Intravenous Q12H   thiamine   100 mg Oral Daily   Or   thiamine   100 mg Intravenous Daily   traZODone   50 mg Oral QHS   Continuous Infusions:  cefTRIAXone  (ROCEPHIN )  IV 2 g (11/02/23 2301)   lactated ringers  999 mL/hr at 11/01/23 1540   vancomycin  1,000 mg (11/03/23 0957)   PRN Meds:.acetaminophen  **OR** acetaminophen , ondansetron  **OR** ondansetron  (ZOFRAN ) IV, senna-docusate    Subjective:   Jake Nguyen was seen and examined today.no new complaints.   Objective:   Vitals:   11/02/23 1129 11/02/23 2106 11/03/23 0505 11/03/23 1435  BP: 102/67 109/74 (!) 95/58 133/80  Pulse: 89 94 94 73   Resp: 19 18 16 19   Temp: 98.5 F (36.9 C) 98.4 F (36.9 C) 98.4 F (36.9 C) (!) 97.5 F (36.4 C)  TempSrc: Oral Oral  Oral  SpO2: 99% 96% 95% 98%  Weight:      Height:        Intake/Output Summary (Last 24 hours) at 11/03/2023 1802 Last data filed at 11/03/2023 1120 Gross per 24 hour  Intake 120 ml  Output 1850 ml  Net -1730 ml   Fredricka  Weights   10/28/23 0704  Weight: 67.3 kg     Exam General exam: Appears calm and comfortable  Respiratory system: Clear to auscultation. Respiratory effort normal. Cardiovascular system: S1 & S2 heard, RRR. No JVD,  Gastrointestinal system: Abdomen is nondistended, soft and nontender.  Central nervous system: Alert and oriented. No focal neurological deficits. Extremities: right BKA stump bandaged.  Skin: multiple excoriations of the skin.  Psychiatry: Mood & affect appropriate.       Data Reviewed:  I have personally reviewed following labs and imaging studies   CBC Lab Results  Component Value Date   WBC 4.8 11/01/2023   RBC 4.10 (L) 11/01/2023   HGB 11.6 (L) 11/01/2023   HCT 37.6 (L) 11/01/2023   MCV 91.7 11/01/2023   MCH 28.3 11/01/2023   PLT 162 11/01/2023   MCHC 30.9 11/01/2023   RDW 14.7 11/01/2023   LYMPHSABS 2.1 11/01/2023   MONOABS 0.6 11/01/2023   EOSABS 0.4 11/01/2023   BASOSABS 0.1 11/01/2023     Last metabolic panel Lab Results  Component Value Date   NA 131 (L) 11/01/2023   K 3.5 11/01/2023   CL 99 11/01/2023   CO2 22 11/01/2023   BUN 6 11/01/2023   CREATININE 0.52 (L) 11/01/2023   GLUCOSE 132 (H) 11/01/2023   GFRNONAA >60 11/01/2023   GFRAA >60 11/12/2019   CALCIUM  8.6 (L) 11/01/2023   PHOS 4.3 09/26/2023   PROT 7.1 10/29/2023   ALBUMIN 2.7 (L) 10/29/2023   BILITOT 0.8 10/29/2023   ALKPHOS 71 10/29/2023   AST 26 10/29/2023   ALT 10 10/29/2023   ANIONGAP 10 11/01/2023    CBG (last 3)  No results for input(s): GLUCAP in the last 72 hours.    Coagulation Profile: Recent Labs  Lab  10/27/23 2234  INR 0.9     Radiology Studies: No results found.      Elgie Butter M.D. [facility] 11/03/2023, 6:02 PM  Available via Epic secure chat 7am-7pm After 7 pm, please refer to night coverage provider listed on amion.

## 2023-11-03 NOTE — Plan of Care (Signed)
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
## 2023-11-04 DIAGNOSIS — F10920 Alcohol use, unspecified with intoxication, uncomplicated: Secondary | ICD-10-CM | POA: Diagnosis not present

## 2023-11-04 DIAGNOSIS — E876 Hypokalemia: Secondary | ICD-10-CM | POA: Diagnosis not present

## 2023-11-04 DIAGNOSIS — T148XXA Other injury of unspecified body region, initial encounter: Secondary | ICD-10-CM | POA: Diagnosis not present

## 2023-11-04 DIAGNOSIS — A419 Sepsis, unspecified organism: Secondary | ICD-10-CM | POA: Diagnosis not present

## 2023-11-04 LAB — CBC WITH DIFFERENTIAL/PLATELET
Abs Immature Granulocytes: 0.01 K/uL (ref 0.00–0.07)
Basophils Absolute: 0.1 K/uL (ref 0.0–0.1)
Basophils Relative: 1 %
Eosinophils Absolute: 0.4 K/uL (ref 0.0–0.5)
Eosinophils Relative: 7 %
HCT: 36.3 % — ABNORMAL LOW (ref 39.0–52.0)
Hemoglobin: 11.3 g/dL — ABNORMAL LOW (ref 13.0–17.0)
Immature Granulocytes: 0 %
Lymphocytes Relative: 52 %
Lymphs Abs: 2.8 K/uL (ref 0.7–4.0)
MCH: 28 pg (ref 26.0–34.0)
MCHC: 31.1 g/dL (ref 30.0–36.0)
MCV: 89.9 fL (ref 80.0–100.0)
Monocytes Absolute: 1 K/uL (ref 0.1–1.0)
Monocytes Relative: 19 %
Neutro Abs: 1.1 K/uL — ABNORMAL LOW (ref 1.7–7.7)
Neutrophils Relative %: 21 %
Platelets: 309 K/uL (ref 150–400)
RBC: 4.04 MIL/uL — ABNORMAL LOW (ref 4.22–5.81)
RDW: 14.7 % (ref 11.5–15.5)
WBC: 5.4 K/uL (ref 4.0–10.5)
nRBC: 0 % (ref 0.0–0.2)

## 2023-11-04 LAB — BASIC METABOLIC PANEL WITH GFR
Anion gap: 9 (ref 5–15)
BUN: 5 mg/dL — ABNORMAL LOW (ref 6–20)
CO2: 26 mmol/L (ref 22–32)
Calcium: 8.9 mg/dL (ref 8.9–10.3)
Chloride: 100 mmol/L (ref 98–111)
Creatinine, Ser: 0.52 mg/dL — ABNORMAL LOW (ref 0.61–1.24)
GFR, Estimated: >60 mL/min (ref >60)
Glucose, Bld: 136 mg/dL — ABNORMAL HIGH (ref 70–99)
Potassium: 3.6 mmol/L (ref 3.5–5.1)
Sodium: 135 mmol/L (ref 135–145)

## 2023-11-04 NOTE — TOC Progression Note (Signed)
 Transition of Care [facility]) - Progression Note    Patient Details  Name: Jake Nguyen MRN: [id number] Date of Birth: 04/30/1965  Transition of Care [facility]) CM/SW Contact  Tawni CHRISTELLA Eva, LCSW Phone Number: 11/04/2023, 12:23 PM  Clinical Narrative:      CSW reached out to [employer] to follow up on pt's wheelchair cushion. CSW spoke with Ada ,she repots pt's [employer] plan is no longer active and pt is Medicaid Pending. She stated they are not able to provide pt with DME. CSW spoke with pt and provided update pt stated he will follow up with social services. Care Management to follow.    Barriers to Discharge: Continued Medical Work up               Expected Discharge Plan and Services                                               Social Drivers of Health (SDOH) Interventions SDOH Screenings   Food Insecurity: Food Insecurity Present (10/28/2023)  Housing: High Risk (10/28/2023)  Transportation Needs: Unmet Transportation Needs (10/28/2023)  Utilities: Patient Declined (10/28/2023)  Alcohol  Screen: Medium Risk (11/25/2020)  Depression (PHQ2-9): Low Risk  (11/06/2019)  Social Connections: Socially Isolated (07/20/2023)  Tobacco Use: High Risk (10/28/2023)    Readmission Risk Interventions     No data to display

## 2023-11-04 NOTE — Plan of Care (Signed)

## 2023-11-04 NOTE — Progress Notes (Addendum)
 [facility]                                                                               Jake Nguyen, is a 58 y.o. male, DOB - 25-Jan-1966, FMW:[id number] Admit date - 10/27/2023    Outpatient Primary MD for the patient is Pcp, No  LOS - 7  days    Brief summary  58 y.o. 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  Eventually developed gangrene and underwent right BKA on 08/20/2023 by Dr. Harden.  Was admitted twice since then for stump site infections.  Most recently admitted 6/11-6/20.  Treated with IV vancomycin  and Rocephin  on admission, narrowed to doxycycline  and Ancef , and ultimately transition to oral Augmentin  and doxycycline .  Completed full course of antibiotics in hospital.  Seen by Dr. Harden for some dehiscence who recommended applying shrinkers and follow-up as an outpatient.  Right knee x-ray showed BKA changes with increased soft tissue swelling of the stump.  Slightly progressive periostitis at the cut ends of the distal tibia and fibula with question subtle erosive change at the distal fibular cut margin raising possibility of osteomyelitis.   Portable chest x-ray with streaky infrahilar opacities.   CT head without contrast showed generalized cerebral atrophy and microvascular disease changes of the supratentorial brain.  No acute intracranial abnormality.  Mild right frontal scalp soft tissue swelling.   Patient was given IV vancomycin , ceftriaxone , azithromycin , 1 L LR.  The hospitalist service was consulted to admit. Plan to complete 7 days of IV antibiotics followed by another 7 day sof oral  antibiotics.  Toc consulted for disposition. [facility]. Patient requesting to stay till Monday to make the calls for either of them.     Assessment & Plan    Assessment and Plan:   S/p right BKA with concern for stump infection/ osteomyelitis:  Right knee x-ray with changes concerning for possible osteomyelitis at the cut ends of distal tibia/fibula.  Patient on IV vancomycin  and IV ceftriaxone .  MRI of the right knee shows Focal T2 hyperintensity within the medullary cavity of the distal tibia without cortical destruction, nonspecific given relatively recent surgery. Recurrent early osteomyelitis not excluded. No other significant osseous findings. Discussed with Dr Jake Nguyen, recommend to continue with IV antibiotics for now. No osteomyelitis.  Complete 7 days of IV antibiotics followed by 7 days of oral antibiotics.  Patient wanted to go to [facility] or [facility].      Alcohol  use disorder with intoxication.  On CIWA.  No signs of withdrawal.    Hypokalemia Replaced, repeat levels wnl.   Hypomagnesemia Replaced. Repeat level wnl.    Anemia of chronic disease.  Hemoglobin stable around 11.   Mild hyponatremia probably from chronic alcohol  use.  Asymptomatic. Sodium of 131.    Thrombocytopenia Resolved.  Estimated body mass index is 21.29 kg/m as calculated from the following:   Height as of this encounter: 5' 10 (1.778 m).   Weight as of this encounter: 67.3 kg.  Code Status: full code.  DVT Prophylaxis:  Place and maintain sequential compression device Start: 10/30/23 1520scd's   Level of Care: Level of care: Progressive Family Communication: none at bedside.   Disposition Plan:     Remains inpatient appropriate:  pending clinical improvement.   Procedures:  None.   Consultants:   None.   Antimicrobials:   Anti-infectives (From admission, onward)    Start     Dose/Rate Route  Frequency Ordered Stop   11/04/23 1000  amoxicillin -clavulanate (AUGMENTIN ) 875-125 MG per tablet 1 tablet        1 tablet Oral Every 12 hours 11/03/23 1057 11/11/23 0959   11/04/23 1000  doxycycline  (VIBRA -TABS) tablet 100 mg        100 mg Oral Every 12 hours 11/03/23 1057 11/11/23 0959   10/28/23 2200  cefTRIAXone  (ROCEPHIN ) 2 g in sodium chloride  0.9 % 100 mL IVPB        2 g 200 mL/hr over 30 Minutes Intravenous Every 24 hours 10/28/23 0000 11/03/23 2205   10/28/23 1000  vancomycin  (VANCOCIN ) IVPB 1000 mg/200 mL premix        1,000 mg 200 mL/hr over 60 Minutes Intravenous Every 12 hours 10/28/23 0041 11/04/23 0603   10/27/23 2330  azithromycin  (ZITHROMAX ) 500 mg in sodium chloride  0.9 % 250 mL IVPB        500 mg 250 mL/hr over 60 Minutes Intravenous  Once 10/27/23 2328 10/28/23 0451   10/27/23 2200  cefTRIAXone  (ROCEPHIN ) 2 g in sodium chloride  0.9 % 100 mL IVPB        2 g 200 mL/hr over 30 Minutes Intravenous Once 10/27/23 2155 10/27/23 2341   10/27/23 2200  vancomycin  (VANCOCIN ) IVPB 1000 mg/200 mL premix        1,000 mg 200 mL/hr over 60 Minutes Intravenous  Once 10/27/23 2155 10/28/23 0112        Medications  Scheduled Meds:  amoxicillin -clavulanate  1 tablet Oral Q12H   doxycycline   100 mg Oral Q12H   folic acid   1 mg Oral Daily   hydrocortisone  cream   Topical QID   multivitamin with minerals  1 tablet Oral Daily   sodium chloride  flush  3 mL Intravenous Q12H   thiamine   100 mg Oral Daily   Or   thiamine   100 mg Intravenous Daily   traZODone   50 mg Oral QHS   Continuous Infusions:  lactated ringers  999 mL/hr at 11/01/23 1540   PRN Meds:.acetaminophen  **OR** acetaminophen , ondansetron  **OR** ondansetron  (ZOFRAN ) IV, senna-docusate    Subjective:   Jake Nguyen was seen and examined today. No new complaints.   Objective:   Vitals:   11/03/23 1435 11/03/23 2019 11/04/23 0638 11/04/23 1100  BP: 133/80 99/71 109/68 130/80  Pulse: 73 88 77 82  Resp: 19 17  18 19   Temp: (!) 97.5 F (36.4 C) 98.3 F (36.8 C) 98.3 F (36.8 C) 98.2 F (36.8 C)  TempSrc: Oral Oral Oral Oral  SpO2: 98% 98% 97% 96%  Weight:      Height:        Intake/Output Summary (Last 24 hours) at 11/04/2023 1521 Last data filed at 11/04/2023 1300 Gross per 24 hour  Intake 360 ml  Output 2050 ml  Net -1690 ml   Filed Weights   10/28/23 0704  Weight: 67.3 kg     Exam General exam: Appears calm and comfortable  Respiratory system: Clear to auscultation. Respiratory effort normal. Cardiovascular system: S1 & S2 heard, RRR. No JVD Gastrointestinal system: Abdomen is nondistended, soft and nontender. Central nervous system: Alert and oriented.  Extremities: left AKA, right BKA stump.  Skin: dry scaly excoriations of the skin.  Psychiatry:Mood & affect appropriate.        Data Reviewed:  I have personally reviewed following labs and imaging studies   CBC Lab Results  Component Value Date   WBC 4.8 11/01/2023   RBC 4.10 (L) 11/01/2023   HGB 11.6 (L) 11/01/2023   HCT 37.6 (L) 11/01/2023   MCV 91.7 11/01/2023   MCH 28.3 11/01/2023   PLT 162 11/01/2023   MCHC 30.9 11/01/2023   RDW 14.7 11/01/2023   LYMPHSABS 2.1 11/01/2023   MONOABS 0.6 11/01/2023   EOSABS 0.4 11/01/2023   BASOSABS 0.1 11/01/2023     Last metabolic panel Lab Results  Component Value Date   NA 131 (L) 11/01/2023   K 3.5 11/01/2023   CL 99 11/01/2023   CO2 22 11/01/2023   BUN 6 11/01/2023   CREATININE 0.52 (L) 11/01/2023   GLUCOSE 132 (H) 11/01/2023   GFRNONAA >60 11/01/2023   GFRAA >60 11/12/2019   CALCIUM  8.6 (L) 11/01/2023   PHOS 4.3 09/26/2023   PROT 7.1 10/29/2023   ALBUMIN 2.7 (L) 10/29/2023   BILITOT 0.8 10/29/2023   ALKPHOS 71 10/29/2023   AST 26 10/29/2023   ALT 10 10/29/2023   ANIONGAP 10 11/01/2023    CBG (last 3)  No results for input(s): GLUCAP in the last 72 hours.    Coagulation Profile: No results for input(s): INR, PROTIME in the last 168  hours.    Radiology Studies: No results found.      Elgie Butter M.D. [facility] 11/04/2023, 3:21 PM  Available via Epic secure chat 7am-7pm After 7 pm, please refer to night coverage provider listed on amion.

## 2023-11-05 DIAGNOSIS — E876 Hypokalemia: Secondary | ICD-10-CM | POA: Diagnosis not present

## 2023-11-05 DIAGNOSIS — F10920 Alcohol use, unspecified with intoxication, uncomplicated: Secondary | ICD-10-CM | POA: Diagnosis not present

## 2023-11-05 DIAGNOSIS — T148XXA Other injury of unspecified body region, initial encounter: Secondary | ICD-10-CM | POA: Diagnosis not present

## 2023-11-05 DIAGNOSIS — A419 Sepsis, unspecified organism: Secondary | ICD-10-CM | POA: Diagnosis not present

## 2023-11-05 NOTE — Plan of Care (Signed)

## 2023-11-05 NOTE — Progress Notes (Signed)
 [facility]                                                                               Jake Nguyen, is a 58 y.o. male, DOB - [birth year], FMW:[id number] Admit date - 10/27/2023    Outpatient Primary MD for the patient is Pcp, No  LOS - 8  days    Brief summary  58 y.o. 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  Eventually developed gangrene and underwent right BKA on 08/20/2023 by Dr. Harden.  Was admitted twice since then for stump site infections.  Most recently admitted 6/11-6/20.  Treated with IV vancomycin  and Rocephin  on admission, narrowed to doxycycline  and Ancef , and ultimately transition to oral Augmentin  and doxycycline .  Completed full course of antibiotics in hospital.  Seen by Dr. Harden for some dehiscence who recommended applying shrinkers and follow-up as an outpatient.  Right knee x-ray showed BKA changes with increased soft tissue swelling of the stump.  Slightly progressive periostitis at the cut ends of the distal tibia and fibula with question subtle erosive change at the distal fibular cut margin raising possibility of osteomyelitis.   Portable chest x-ray with streaky infrahilar opacities.   CT head without contrast showed generalized cerebral atrophy and microvascular disease changes of the supratentorial brain.  No acute intracranial abnormality.  Mild right frontal scalp soft tissue swelling.   Patient was given IV vancomycin , ceftriaxone , azithromycin , 1 L LR.  The hospitalist service was consulted to admit. Plan to complete 7 days of IV antibiotics followed by another 7 day sof oral  antibiotics.  Toc consulted for disposition. [facility] Patient requesting to stay till Monday to make the calls for either of them.     Assessment & Plan    Assessment and Plan:   S/p right BKA with concern for stump infection/ osteomyelitis:  Right knee x-ray with changes concerning for possible osteomyelitis at the cut ends of distal tibia/fibula.  Patient on IV vancomycin  and IV ceftriaxone .  MRI of the right knee shows Focal T2 hyperintensity within the medullary cavity of the distal tibia without cortical destruction, nonspecific given relatively recent surgery. Recurrent early osteomyelitis not excluded. No other significant osseous findings. Discussed with Dr Harden, recommend to continue with IV antibiotics for now. No osteomyelitis.  Complete 7 days of IV antibiotics followed by 7 days of oral antibiotics.  Patient wanted to go to [facility] or [facility].  No new complaints. He remains afebrile and BKA stump pain controlled.  Patient requesting DME wheel chair with seat cushion on discharge, . Toc on board.    Alcohol  use disorder with intoxication.  On CIWA.  No signs of withdrawal.    Hypokalemia Replaced, repeat levels wnl.   Hypomagnesemia Replaced. Repeat level wnl.    Anemia of chronic disease.  Hemoglobin stable around 11.   Mild hyponatremia probably from chronic alcohol  use.  Asymptomatic. Sodium of 131.    Thrombocytopenia Resolved.       Estimated body mass index is 21.29 kg/m as calculated from the following:   Height as of this encounter: 5' 10 (1.778 m).   Weight as of this encounter: 67.3 kg.  Code Status: full code.  DVT Prophylaxis:  Place and maintain sequential compression device Start: 10/30/23 1520scd's   Level of Care: Level of care: Progressive Family Communication: none at bedside.   Disposition Plan:     Remains inpatient appropriate:  Possibly tomorrow, [facility].  [facility]'t [facility] go [facility] or [facility].   Procedures:  None.   Consultants:   Curb side with Dr Harden.   Antimicrobials:   Anti-infectives (From admission, onward)    Start     Dose/Rate Route Frequency Ordered Stop   11/04/23 1000  amoxicillin -clavulanate (AUGMENTIN ) 875-125 MG per tablet 1 tablet        1 tablet Oral Every 12 hours 11/03/23 1057 11/11/23 0959   11/04/23 1000  doxycycline  (VIBRA -TABS) tablet 100 mg        100 mg Oral Every 12 hours 11/03/23 1057 11/11/23 0959   10/28/23 2200  cefTRIAXone  (ROCEPHIN ) 2 g in sodium chloride  0.9 % 100 mL IVPB        2 g 200 mL/hr over 30 Minutes Intravenous Every 24 hours 10/28/23 0000 11/03/23 2205   10/28/23 1000  vancomycin  (VANCOCIN ) IVPB 1000 mg/200 mL premix        1,000 mg 200 mL/hr over 60 Minutes Intravenous Every 12 hours 10/28/23 0041 11/04/23 0603   10/27/23 2330  azithromycin  (ZITHROMAX ) 500 mg in sodium chloride  0.9 % 250 mL IVPB        500 mg 250 mL/hr over 60 Minutes Intravenous  Once 10/27/23 2328 10/28/23 0451   10/27/23 2200  cefTRIAXone  (ROCEPHIN ) 2 g in sodium chloride  0.9 % 100 mL IVPB        2 g 200 mL/hr over 30 Minutes Intravenous Once 10/27/23 2155 10/27/23 2341   10/27/23 2200  vancomycin  (VANCOCIN ) IVPB 1000 mg/200 mL premix        1,000 mg 200 mL/hr over 60 Minutes Intravenous  Once 10/27/23 2155 10/28/23 0112        Medications  Scheduled Meds:  amoxicillin -clavulanate  1 tablet Oral Q12H   doxycycline   100 mg Oral Q12H   folic acid   1 mg Oral Daily   hydrocortisone  cream   Topical QID   multivitamin with minerals  1 tablet Oral Daily   sodium chloride  flush  3 mL Intravenous Q12H   thiamine   100 mg Oral Daily   Or   thiamine   100 mg Intravenous Daily   traZODone   50 mg Oral QHS   Continuous Infusions:  lactated ringers  999 mL/hr at 11/01/23 1540   PRN Meds:.acetaminophen  **OR** acetaminophen , ondansetron  **OR** ondansetron  (ZOFRAN ) IV,  senna-docusate    Subjective:   Jake Nguyen was seen and examined today no new complaints.   Objective:   Vitals:   11/04/23 0638 11/04/23 1100 11/04/23 2016 11/05/23 0450  BP: 109/68 130/80 107/80 (!) 97/58  Pulse: 77 82 84 86  Resp: 18 19    Temp: 98.3 F (36.8 C) 98.2 F (36.8 C) 98.1 F (36.7 C) 98.4 F (36.9 C)  TempSrc: Oral Oral Oral Oral  SpO2: 97% 96% 97% 100%  Weight:  Height:        Intake/Output Summary (Last 24 hours) at 11/05/2023 0830 Last data filed at 11/04/2023 1300 Gross per 24 hour  Intake 240 ml  Output 550 ml  Net -310 ml   Filed Weights   10/28/23 0704  Weight: 67.3 kg     Exam General exam: Appears calm and comfortable  Respiratory system: Clear to auscultation. Respiratory effort normal. Cardiovascular system: S1 & S2 heard, RRR.  Gastrointestinal system: Abdomen is nondistended, soft and nontender.  Central nervous system: Alert and oriented. No focal neurological deficits. Extremities: right BKA.  Skin: dry scaly  excoriations on his upper extremities.  Psychiatry: Mood & affect appropriate.         Data Reviewed:  I have personally reviewed following labs and imaging studies   CBC Lab Results  Component Value Date   WBC 5.4 11/04/2023   RBC 4.04 (L) 11/04/2023   HGB 11.3 (L) 11/04/2023   HCT 36.3 (L) 11/04/2023   MCV 89.9 11/04/2023   MCH 28.0 11/04/2023   PLT 309 11/04/2023   MCHC 31.1 11/04/2023   RDW 14.7 11/04/2023   LYMPHSABS 2.8 11/04/2023   MONOABS 1.0 11/04/2023   EOSABS 0.4 11/04/2023   BASOSABS 0.1 11/04/2023     Last metabolic panel Lab Results  Component Value Date   NA 135 11/04/2023   K 3.6 11/04/2023   CL 100 11/04/2023   CO2 26 11/04/2023   BUN 5 (L) 11/04/2023   CREATININE 0.52 (L) 11/04/2023   GLUCOSE 136 (H) 11/04/2023   GFRNONAA >60 11/04/2023   GFRAA >60 11/12/2019   CALCIUM  8.9 11/04/2023   PHOS 4.3 09/26/2023   PROT 7.1 10/29/2023   ALBUMIN 2.7 (L) 10/29/2023   BILITOT  0.8 10/29/2023   ALKPHOS 71 10/29/2023   AST 26 10/29/2023   ALT 10 10/29/2023   ANIONGAP 9 11/04/2023    CBG (last 3)  No results for input(s): GLUCAP in the last 72 hours.    Coagulation Profile: No results for input(s): INR, PROTIME in the last 168 hours.    Radiology Studies: No results found.      Elgie Butter M.D. [facility] 11/05/2023, 8:30 AM  Available via Epic secure chat 7am-7pm After 7 pm, please refer to night coverage provider listed on amion.

## 2023-11-06 ENCOUNTER — Other Ambulatory Visit (HOSPITAL_BASED_OUTPATIENT_CLINIC_OR_DEPARTMENT_OTHER): Payer: Self-pay

## 2023-11-06 ENCOUNTER — Other Ambulatory Visit (HOSPITAL_COMMUNITY): Payer: Self-pay

## 2023-11-06 DIAGNOSIS — F10920 Alcohol use, unspecified with intoxication, uncomplicated: Secondary | ICD-10-CM | POA: Diagnosis not present

## 2023-11-06 DIAGNOSIS — A419 Sepsis, unspecified organism: Secondary | ICD-10-CM | POA: Diagnosis not present

## 2023-11-06 DIAGNOSIS — L089 Local infection of the skin and subcutaneous tissue, unspecified: Secondary | ICD-10-CM | POA: Diagnosis not present

## 2023-11-06 DIAGNOSIS — E876 Hypokalemia: Secondary | ICD-10-CM

## 2023-11-06 DIAGNOSIS — T148XXA Other injury of unspecified body region, initial encounter: Secondary | ICD-10-CM

## 2023-11-06 MED ORDER — DOXYCYCLINE HYCLATE 100 MG PO TABS
100.0000 mg | ORAL_TABLET | Freq: Two times a day (BID) | ORAL | 0 refills | Status: DC
  Filled 2023-11-06: qty 10, 5d supply, fill #0

## 2023-11-06 MED ORDER — THIAMINE HCL 100 MG PO TABS
100.0000 mg | ORAL_TABLET | Freq: Every day | ORAL | 0 refills | Status: DC
Start: 2023-11-06 — End: 2023-11-22
  Filled 2023-11-06: qty 30, 30d supply, fill #0

## 2023-11-06 MED ORDER — AMOXICILLIN-POT CLAVULANATE 875-125 MG PO TABS
1.0000 | ORAL_TABLET | Freq: Two times a day (BID) | ORAL | 0 refills | Status: DC
Start: 2023-11-06 — End: 2023-11-14
  Filled 2023-11-06: qty 10, 5d supply, fill #0

## 2023-11-06 MED ORDER — ACETAMINOPHEN 325 MG PO TABS
650.0000 mg | ORAL_TABLET | Freq: Four times a day (QID) | ORAL | 0 refills | Status: DC | PRN
  Filled 2023-11-06: qty 30, 4d supply, fill #0

## 2023-11-06 MED ORDER — FOLIC ACID 1 MG PO TABS
1.0000 mg | ORAL_TABLET | Freq: Every day | ORAL | 0 refills | Status: DC
  Filled 2023-11-06: qty 30, 30d supply, fill #0

## 2023-11-06 NOTE — Plan of Care (Incomplete)
  Problem: Education: Goal: Knowledge of General Education information will improve Description: Including pain rating scale, medication(s)/side effects and non-pharmacologic comfort measures Outcome: Progressing   Problem: Health Behavior/Discharge Planning: Goal: Ability to manage health-related needs will improve Outcome: Progressing   Problem: Clinical Measurements: Goal: Ability to maintain clinical measurements within normal limits will improve Outcome: Progressing Goal: Will remain free from infection Outcome: Progressing Goal: Diagnostic test results will improve Outcome: Progressing   Problem: Activity: Goal: Risk for activity intolerance will decrease Outcome: Progressing   Problem: Skin Integrity: Goal: Risk for impaired skin integrity will decrease Outcome: Progressing   Problem: Clinical Measurements: Goal: Respiratory complications will improve Outcome: Adequate for Discharge Goal: Cardiovascular complication will be avoided Outcome: Adequate for Discharge   Problem: Nutrition: Goal: Adequate nutrition will be maintained Outcome: Adequate for Discharge   Problem: Coping: Goal: Level of anxiety will decrease Outcome: Adequate for Discharge   Problem: Elimination: Goal: Will not experience complications related to bowel motility Outcome: Adequate for Discharge Goal: Will not experience complications related to urinary retention Outcome: Adequate for Discharge   Problem: Pain Managment: Goal: General experience of comfort will improve and/or be controlled Outcome: Adequate for Discharge   Problem: Safety: Goal: Ability to remain free from injury will improve Outcome: Adequate for Discharge

## 2023-11-06 NOTE — Discharge Summary (Addendum)
 Physician Discharge Summary   Patient: Jake Nguyen MRN: [id number] DOB: [birth year]  Admit date:     10/27/2023  Discharge date: 11/06/23  Discharge Physician: Sabas GORMAN Brod   PCP: Pcp, No   Recommendations at discharge:   Follow-up Dr. Harden as outpatient  Discharge Diagnoses: Principal Problem:   Right BKA Wound infection Active Problems:   Hypokalemia   Alcohol  use disorder, severe, dependence (HCC)  Resolved Problems:   * No resolved hospital problems. *  Hospital Course: 58 y.o. 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  Eventually developed gangrene and underwent right BKA on 08/20/2023 by Dr. Harden.  Was admitted twice since then for stump site infections.  Most recently admitted 6/11-6/20.  Treated with IV vancomycin  and Rocephin  on admission, narrowed to doxycycline  and Ancef , and ultimately transition to oral Augmentin  and doxycycline .  Completed full course of antibiotics in hospital.  Seen by Dr. Harden for some dehiscence who recommended applying shrinkers and follow-up as an outpatient.  Right knee x-ray showed BKA changes with increased soft tissue swelling of the stump.  Slightly progressive periostitis at the cut ends of the distal tibia and fibula with question subtle erosive change at the distal fibular cut margin raising possibility of osteomyelitis.   Portable chest x-ray with streaky infrahilar opacities.   CT head without contrast showed generalized cerebral atrophy and microvascular disease changes of the supratentorial brain.  No acute intracranial abnormality.  Mild right frontal scalp soft tissue  swelling.   Patient was given IV vancomycin , ceftriaxone , azithromycin , 1 L LR.  The hospitalist service was consulted to admit. Plan to complete 7 days of IV antibiotics followed by another 7 day sof oral antibiotics.  Toc consulted for disposition. Patient was [facility] or [facility]. Patient requesting to stay till Monday to make the calls for either of them.       Assessment and Plan:  S/p right BKA with concern for stump infection/ osteomyelitis:  Right knee x-ray with changes concerning for possible osteomyelitis at the cut ends of distal tibia/fibula.  Patient was started on  IV vancomycin  and IV ceftriaxone .  MRI of the right knee shows Focal T2 hyperintensity within the medullary cavity of the distal tibia without cortical destruction, nonspecific given relatively recent surgery. Recurrent early osteomyelitis not excluded. No other significant osseous findings. Discussed with Dr Harden, recommend to continue with IV antibiotics for now. No osteomyelitis.  Complete 7 days of IV antibiotics followed by 7 days of oral antibiotics.  - Will discharge on Augmentin  doxycycline  for 5 more days, he received 2 days of oral antibiotics in the hospital Patient wanted to go to [facility] or [facility].  - Follow-up Dr. Harden as outpatient      Alcohol  use disorder with intoxication.  On CIWA.  No signs of withdrawal.  -Will discharge on thiamine  and folate     Hypokalemia Replete   Hypomagnesemia Replete     Anemia of chronic disease.  Hemoglobin stable around 11.    Mild hyponatremia probably from chronic alcohol  use.  Resolved, today sodium is 135     Thrombocytopenia Resolved.  Consultants:  Procedures performed:   Disposition: Homeless, he will go to [facility] Diet recommendation:  Discharge Diet Orders (From admission, onward)     Start     Ordered   11/06/23 0000  Diet - low sodium heart healthy        11/06/23 1358            Regular diet DISCHARGE MEDICATION: Allergies as of 11/06/2023       Reactions   Pork-derived Products Other (See Comments)   Pt states he doesn't eat pork        Medication List     STOP taking these medications    CertaVite/Antioxidants Tabs       TAKE these medications    acetaminophen  325 MG tablet Commonly known as: TYLENOL  Take 2 tablets (650 mg total) by mouth every 6 (six) hours as needed for mild pain (pain score 1-3) or fever (or Fever >/= 101).   amoxicillin -clavulanate 875-125 MG tablet Commonly known as: AUGMENTIN  Take 1 tablet by mouth every 12 (twelve) hours.   atorvastatin  40 MG tablet Commonly known as: LIPITOR Take 1 tablet (40 mg total) by mouth daily.   doxycycline  100 MG tablet Commonly known as: VIBRA -TABS Take 1 tablet (100 mg total) by mouth every 12 (twelve) hours.   folic acid  1 MG tablet Commonly known as: FOLVITE  Take 1 tablet (1 mg total) by mouth daily.   gabapentin  100 MG capsule Commonly known as: NEURONTIN  Take 1 capsule (100 mg total) by mouth 3 (three) times daily.   pantoprazole  40 MG tablet Commonly known as: PROTONIX  Take 1 tablet (40 mg total) by mouth daily.   thiamine  100 MG tablet Commonly known as: VITAMIN B1 Take 1 tablet (100 mg total) by mouth daily.               Durable Medical Equipment  (From admission, onward)           Start     Ordered   11/02/23 1723  For home use only DME lightweight manual wheelchair with seat cushion  Once       Comments: Patient suffers from BKA AND AKA which impairs their ability to perform daily activities like dressing in the home.  A walker will not resolve  issue with performing activities of daily living. A wheelchair will allow patient to safely perform daily activities. Patient is not able to propel themselves in the home using a standard weight wheelchair due to general weakness. Patient can self propel in the lightweight wheelchair. Length of  need Lifetime. Accessories: elevating leg rests (ELRs), wheel locks, extensions and anti-tippers.   11/02/23 1723              Discharge Care Instructions  (From admission, onward)           Start     Ordered   11/06/23 0000  Discharge wound care:       Comments: Saline moist gauze to the stump wound, cover with ABD pad. Change daily   11/06/23 1358            Discharge Exam: Filed Weights   10/28/23 0704  Weight: 67.3 kg   Appears in no acute distress S1-S2, regular Abdomen is soft Extremities-bilateral BKA  Condition at discharge: stable  The results of significant diagnostics from this hospitalization (including imaging, microbiology, ancillary and laboratory) are listed below for reference.   Imaging Studies: MR KNEE RIGHT WO CONTRAST Result Date: 10/30/2023 CLINICAL DATA:  Recurrent, non  healing wound on distal and of stump. History of right below the knee amputation 08/20/2023. EXAM: MRI OF THE RIGHT KNEE WITHOUT CONTRAST TECHNIQUE: Multiplanar, multisequence MR imaging of the knee was performed. No intravenous contrast was administered. COMPARISON:  Radiographs 10/27/2023 and 09/20/2023.  CT 09/04/2023. FINDINGS: Bones/Joint/Cartilage Status post below the knee amputation. Apparent soft tissue wound along the medial aspect of the distal tibial stump, best seen on the coronal images. Focal T2 hyperintensity within the medullary cavity of the distal tibia without cortical destruction. No abnormal signal identified in the distal fibula. The bones are demineralized. No acute osseous findings are seen about the knee. The knee joint is incompletely visualized, without evidence of significant joint effusion. Ligaments The cruciate and collateral ligaments appear intact at the knee. The menisci appear intact. Muscles and Tendons T2 hyperintensity and atrophy throughout the lower leg musculature, likely related to subacute denervation. The visualized extensor mechanism at  the knee appears intact. Soft tissue The peripherally enhancing fluid collection adjacent to the amputation seen on previous CT has resolved. As above, possible soft tissue ulceration along the medial aspect of the distal tibia with underlying inflammatory changes in the soft tissues, but no organized fluid collection. The previously demonstrated well-circumscribed cystic lesion along the medial aspect of the medial femoral condyle is again noted and appears unchanged, measuring up to 2.8 x 1.2 cm on image 5/19. This demonstrates heterogeneous T2 signal and homogeneous intermediate T1 signal. IMPRESSION: 1. Status post below the knee amputation with apparent soft tissue wound along the medial aspect of the distal tibial stump. Underlying soft tissue inflammatory changes without residual or recurrent organized fluid collection to suggest abscess at this time. 2. Focal T2 hyperintensity within the medullary cavity of the distal tibia without cortical destruction, nonspecific given relatively recent surgery. Recurrent early osteomyelitis not excluded. No other significant osseous findings. 3. Stable cystic lesion along the medial aspect of the medial femoral condyle, likely a sebaceous or ganglion cyst. Correlate clinically. 4. T2 hyperintensity and atrophy throughout the lower leg musculature, likely related to subacute denervation. Electronically Signed   By: Elsie Perone M.D.   On: 10/30/2023 11:48   CT Head Wo Contrast Result Date: 10/27/2023 CLINICAL DATA:  Altered mental status. EXAM: CT HEAD WITHOUT CONTRAST TECHNIQUE: Contiguous axial images were obtained from the base of the skull through the vertex without intravenous contrast. RADIATION DOSE REDUCTION: This exam was performed according to the departmental dose-optimization program which includes automated exposure control, adjustment of the mA and/or kV according to patient size and/or use of iterative reconstruction technique. COMPARISON:  October 22, 2023 FINDINGS: Brain: There is generalized cerebral atrophy with widening of the extra-axial spaces and ventricular dilatation. There are areas of decreased attenuation within the white matter tracts of the supratentorial brain, consistent with microvascular disease changes. Vascular: No hyperdense vessel or unexpected calcification. Skull: Normal. Negative for fracture or focal lesion. Sinuses/Orbits: No acute finding. Other: Mild right frontal scalp soft tissue swelling is seen. IMPRESSION: 1. Generalized cerebral atrophy and microvascular disease changes of the supratentorial brain. 2. No acute intracranial abnormality. 3. Mild right frontal scalp soft tissue swelling. Electronically Signed   By: Suzen Dials M.D.   On: 10/27/2023 23:17   DG Chest Port 1 View Result Date: 10/27/2023 CLINICAL DATA:  Possible sepsis EXAM: PORTABLE CHEST 1 VIEW COMPARISON:  07/20/2023 FINDINGS: No pleural effusion or pneumothorax. Streaky infrahilar opacities. Normal cardiac size. IMPRESSION: Streaky infrahilar opacities, atelectasis versus pneumonia infiltrate Electronically Signed   By: Luke  Scott M.D.   On: 10/27/2023 23:17   DG Knee Complete 4 Views Right Result Date: 10/27/2023 CLINICAL DATA:  Drainage from amputation site EXAM: RIGHT KNEE - COMPLETE 4+ VIEW COMPARISON:  09/20/2023, 09/04/2023 FINDINGS: Status post below the knee amputation. Increased soft tissue swelling the distal stump. No soft tissue gas. Possible subtle erosive change at the cut margin of the distal fibula, slightly progressing periostitis at the cut ends of the distal tibia and fibula. IMPRESSION: Status post below the knee amputation with increased soft tissue swelling of the stump. Slightly progressive periostitis at the cut ends of the distal tibia and fibula with question subtle erosive change at the distal fibular cut margin raising possibility of osteomyelitis. Suggest correlation with MRI. Electronically Signed   By: Luke Scott  M.D.   On: 10/27/2023 23:15   CT Head Wo Contrast Result Date: 10/22/2023 CLINICAL DATA:  Head trauma, moderate-severe EXAM: CT HEAD WITHOUT CONTRAST TECHNIQUE: Contiguous axial images were obtained from the base of the skull through the vertex without intravenous contrast. RADIATION DOSE REDUCTION: This exam was performed according to the departmental dose-optimization program which includes automated exposure control, adjustment of the mA and/or kV according to patient size and/or use of iterative reconstruction technique. COMPARISON:  Head CT 09/17/2023 FINDINGS: Brain: No intracranial hemorrhage, mass effect, or midline shift. Stable atrophy. No hydrocephalus. The basilar cisterns are patent. No evidence of territorial infarct or acute ischemia. No extra-axial or intracranial fluid collection. Vascular: Atherosclerosis of skullbase vasculature without hyperdense vessel or abnormal calcification. Skull: No fracture or focal lesion. Sinuses/Orbits: Mucosal thickening of the paranasal sinuses. No acute findings. Other: None. IMPRESSION: 1. No acute intracranial abnormality. No skull fracture. 2. Stable atrophy. Electronically Signed   By: Andrea Gasman M.D.   On: 10/22/2023 16:44    Microbiology: Results for orders placed or performed during the hospital encounter of 10/27/23  Blood Culture (routine x 2)     Status: None   Collection Time: 10/27/23 10:34 PM   Specimen: BLOOD  Result Value Ref Range Status   Specimen Description   Final    BLOOD RIGHT ANTECUBITAL Performed at [facility], [address], [place], [place] [address]    Special Requests   Final    BOTTLES DRAWN AEROBIC AND ANAEROBIC Blood Culture results may not be optimal due to an inadequate volume of blood received in culture bottles Performed at [facility], [address] [place], [place] [address]    Culture   Final    NO GROWTH 5 DAYS Performed at [facility], [address]  [place], [place] [address]    Report Status 11/02/2023 FINAL  Final  Resp panel by RT-PCR (RSV, Flu A&B, Covid) Anterior Nasal Swab     Status: None   Collection Time: 10/27/23 11:27 PM   Specimen: Anterior Nasal Swab  Result Value Ref Range Status   SARS Coronavirus 2 by RT PCR NEGATIVE NEGATIVE Final    Comment: (NOTE) SARS-CoV-2 target nucleic acids are NOT DETECTED.  The SARS-CoV-2 RNA is generally detectable in upper respiratory specimens during the acute phase of infection. The lowest concentration of SARS-CoV-2 viral copies this assay can detect is 138 copies/mL. A negative result does not preclude SARS-Cov-2 infection and should not be used as the sole basis for treatment or other patient management decisions. A negative result may occur with  improper specimen collection/handling, submission of specimen other than nasopharyngeal swab, presence of viral mutation(s) within the areas  targeted by this assay, and inadequate number of viral copies(<138 copies/mL). A negative result must be combined with clinical observations, patient history, and epidemiological information. The expected result is Negative.  Fact Sheet for Patients:  BloggerCourse.com  Fact Sheet for Healthcare Providers:  SeriousBroker.it  This test is no t yet approved or cleared by the [place]  FDA and  has been authorized for detection and/or diagnosis of SARS-CoV-2 by FDA under an Emergency Use Authorization (EUA). This EUA will remain  in effect (meaning this test can be used) for the duration of the COVID-19 declaration under Section 564(b)(1) of the Act, 21 U.S.C.section 360bbb-3(b)(1), unless the authorization is terminated  or revoked sooner.       Influenza A by PCR NEGATIVE NEGATIVE Final   Influenza B by PCR NEGATIVE NEGATIVE Final    Comment: (NOTE) The Xpert Xpress SARS-CoV-2/FLU/RSV plus assay is intended as an aid in the diagnosis of  influenza from Nasopharyngeal swab specimens and should not be used as a sole basis for treatment. Nasal washings and aspirates are unacceptable for Xpert Xpress SARS-CoV-2/FLU/RSV testing.  Fact Sheet for Patients: BloggerCourse.com  Fact Sheet for Healthcare Providers: SeriousBroker.it  This test is not yet approved or cleared by the [place]  FDA and has been authorized for detection and/or diagnosis of SARS-CoV-2 by FDA under an Emergency Use Authorization (EUA). This EUA will remain in effect (meaning this test can be used) for the duration of the COVID-19 declaration under Section 564(b)(1) of the Act, 21 U.S.C. section 360bbb-3(b)(1), unless the authorization is terminated or revoked.     Resp Syncytial Virus by PCR NEGATIVE NEGATIVE Final    Comment: (NOTE) Fact Sheet for Patients: BloggerCourse.com  Fact Sheet for Healthcare Providers: SeriousBroker.it  This test is not yet approved or cleared by the [place]  FDA and has been authorized for detection and/or diagnosis of SARS-CoV-2 by FDA under an Emergency Use Authorization (EUA). This EUA will remain in effect (meaning this test can be used) for the duration of the COVID-19 declaration under Section 564(b)(1) of the Act, 21 U.S.C. section 360bbb-3(b)(1), unless the authorization is terminated or revoked.  Performed at [facility], [address] [place], [place] [address]   Blood Culture (routine x 2)     Status: None   Collection Time: 10/28/23  4:08 AM   Specimen: BLOOD LEFT HAND  Result Value Ref Range Status   Specimen Description   Final    BLOOD LEFT HAND Performed at [facility], [address] [place], [place] [address]    Special Requests   Final    BOTTLES DRAWN AEROBIC AND ANAEROBIC Blood Culture adequate volume Performed at [facility], [address], [place], [place] [address]    Culture   Final    NO GROWTH 5 DAYS Performed at [facility], [address] [place], [place] [address]    Report Status 11/02/2023 FINAL  Final    Labs: CBC: Recent Labs  Lab 11/01/23 0426 11/04/23 1616  WBC 4.8 5.4  NEUTROABS 1.6* 1.1*  HGB 11.6* 11.3*  HCT 37.6* 36.3*  MCV 91.7 89.9  PLT 162 309   Basic Metabolic Panel: Recent Labs  Lab 10/30/23 2038 10/31/23 0427 11/01/23 0426 11/04/23 1616  NA  --  133* 131* 135  K 3.5 3.7 3.5 3.6  CL  --  100 99 100  CO2  --  26 22 26   GLUCOSE  --  132* 132* 136*  BUN  --  5* 6 5*  CREATININE  --  0.47* 0.52* 0.52*  CALCIUM   --  8.5* 8.6* 8.9  MG  --  1.6* 1.8  --    Liver Function Tests: No results for input(s): AST, ALT, ALKPHOS, BILITOT, PROT, ALBUMIN in the last 168 hours. CBG: No results for input(s): GLUCAP in the last 168 hours.  Discharge time spent: greater than 30 minutes.  Signed: Sabas GORMAN Brod, MD [facility] 11/06/2023

## 2023-11-06 NOTE — Progress Notes (Signed)
 [facility]  PROGRESS NOTE  Jake Nguyen FMW:[id number] DOB: 1965-10-09 DOA: 10/27/2023 PCP: Pcp, No   Brief HPI:   58 y.o. 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  Eventually developed gangrene and underwent right BKA on 08/20/2023 by Dr. Harden.  Was admitted twice since then for stump site infections.  Most recently admitted 6/11-6/20.  Treated with IV vancomycin  and Rocephin  on admission, narrowed to doxycycline  and Ancef , and ultimately transition to oral Augmentin  and doxycycline .  Completed full course of antibiotics in hospital.  Seen by Dr. Harden for some dehiscence who recommended applying shrinkers and follow-up as an outpatient.  Right knee x-ray showed BKA changes with increased soft tissue swelling of the stump.  Slightly progressive periostitis at the cut ends of the distal tibia and fibula with question subtle erosive change at the distal fibular cut margin raising possibility of osteomyelitis.   Portable chest x-ray with streaky infrahilar opacities.   CT head without contrast showed generalized cerebral atrophy and microvascular disease changes of the supratentorial brain.  No acute intracranial abnormality.  Mild right frontal scalp soft tissue swelling.   Patient was given IV vancomycin , ceftriaxone , azithromycin , 1 L LR.  The hospitalist service was consulted to admit. Plan to complete 7 days of IV antibiotics followed by another 7 day sof oral antibiotics.  Toc consulted for disposition. [facility] or [facility]. Patient requesting to stay till Monday to make  the calls for either of them.       Assessment/Plan:   S/p right BKA with concern for stump infection/ osteomyelitis:  Right knee x-ray with changes concerning for possible osteomyelitis at the cut ends of distal tibia/fibula.  Patient was started on  IV vancomycin  and IV ceftriaxone .  MRI of the right knee shows Focal T2 hyperintensity within the medullary cavity of the distal tibia without cortical destruction, nonspecific given relatively recent surgery. Recurrent early osteomyelitis not excluded. No other significant osseous findings. Discussed with Dr Harden, recommend to continue with IV antibiotics for now. No osteomyelitis.  Complete 7 days of IV antibiotics followed by 7 days of oral antibiotics.  Patient wanted to [facility] to [facility] or [facility].       Alcohol  use disorder with intoxication.  On CIWA.  No signs of withdrawal.      Hypokalemia Replete   Hypomagnesemia Replete     Anemia of chronic disease.  Hemoglobin stable around 11.    Mild hyponatremia probably from chronic alcohol  use.  Asymptomatic. Sodium of 131.      Thrombocytopenia Resolved.     Medications     amoxicillin -clavulanate  1 tablet Oral Q12H   doxycycline   100 mg Oral Q12H   folic acid   1 mg Oral Daily   hydrocortisone  cream   Topical QID   multivitamin with minerals  1 tablet Oral Daily   sodium chloride  flush  3 mL Intravenous Q12H   thiamine   100 mg Oral Daily   Or   thiamine   100 mg Intravenous Daily   traZODone   50 mg Oral QHS     Data Reviewed:   CBG:  No results for input(s): GLUCAP in  the last 168 hours.  SpO2: 99 % O2 Flow Rate (L/min): 2 L/min    Vitals:   11/05/23 0450 11/05/23 1413 11/05/23 2019 11/06/23 0359  BP: (!) 97/58 121/75 107/71 102/61  Pulse: 86  72 79  Resp:  18  18  Temp: 98.4 F (36.9 C) 98.5 F (36.9 C) 97.7 F (36.5 C) 98.2 F (36.8 C)  TempSrc: Oral Oral Oral Oral  SpO2: 100% 99% 99% 99%  Weight:      Height:          Data  Reviewed:  Basic Metabolic Panel: Recent Labs  Lab 10/30/23 2038 10/31/23 0427 11/01/23 0426 11/04/23 1616  NA  --  133* 131* 135  K 3.5 3.7 3.5 3.6  CL  --  100 99 100  CO2  --  26 22 26   GLUCOSE  --  132* 132* 136*  BUN  --  5* 6 5*  CREATININE  --  0.47* 0.52* 0.52*  CALCIUM   --  8.5* 8.6* 8.9  MG  --  1.6* 1.8  --     CBC: Recent Labs  Lab 11/01/23 0426 11/04/23 1616  WBC 4.8 5.4  NEUTROABS 1.6* 1.1*  HGB 11.6* 11.3*  HCT 37.6* 36.3*  MCV 91.7 89.9  PLT 162 309    LFT No results for input(s): AST, ALT, ALKPHOS, BILITOT, PROT, ALBUMIN in the last 168 hours.   Antibiotics: Anti-infectives (From admission, onward)    Start     Dose/Rate Route Frequency Ordered Stop   11/04/23 1000  amoxicillin -clavulanate (AUGMENTIN ) 875-125 MG per tablet 1 tablet        1 tablet Oral Every 12 hours 11/03/23 1057 11/11/23 0959   11/04/23 1000  doxycycline  (VIBRA -TABS) tablet 100 mg        100 mg Oral Every 12 hours 11/03/23 1057 11/11/23 0959   10/28/23 2200  cefTRIAXone  (ROCEPHIN ) 2 g in sodium chloride  0.9 % 100 mL IVPB        2 g 200 mL/hr over 30 Minutes Intravenous Every 24 hours 10/28/23 0000 11/03/23 2205   10/28/23 1000  vancomycin  (VANCOCIN ) IVPB 1000 mg/200 mL premix        1,000 mg 200 mL/hr over 60 Minutes Intravenous Every 12 hours 10/28/23 0041 11/04/23 0603   10/27/23 2330  azithromycin  (ZITHROMAX ) 500 mg in sodium chloride  0.9 % 250 mL IVPB        500 mg 250 mL/hr over 60 Minutes Intravenous  Once 10/27/23 2328 10/28/23 0451   10/27/23 2200  cefTRIAXone  (ROCEPHIN ) 2 g in sodium chloride  0.9 % 100 mL IVPB        2 g 200 mL/hr over 30 Minutes Intravenous Once 10/27/23 2155 10/27/23 2341   10/27/23 2200  vancomycin  (VANCOCIN ) IVPB 1000 mg/200 mL premix        1,000 mg 200 mL/hr over 60 Minutes Intravenous  Once 10/27/23 2155 10/28/23 0112        DVT prophylaxis: Bilateral BKA  Code Status: Full code  Family Communication: No family at  bedside   CONSULTS    Subjective   Denies any complaints   Objective    Physical Examination:  Appears in no acute distress S1-S2, regular Lungs clear to auscultation bilaterally Extremities-bilateral BKA  Status is: Inpatient:             Jake Nguyen   [facility] If 7PM-7AM, please contact night-coverage at [email or web address], Office  [phone]   11/06/2023, 7:54 AM  LOS: 9 days

## 2023-11-06 NOTE — TOC Transition Note (Signed)
 Transition of Care [facility]) - Discharge Note   Patient Details  Name: Jake Nguyen MRN: [id number] Date of Birth: 09/20/1965  [facility] CM/SW Contact:  Tawni CHRISTELLA Eva, LCSW Phone Number: 11/06/2023, 8:44 AM   Clinical Narrative:    The pt was informed last week that there is a waiting list for [facility] and that a bed will not be available at this time. Pt informed CSW that he was on their waiting list for a bed. The pt was provided with resources for the [facility] and advised to speak with their case management team for assistance in obtaining the necessary items for securing permanent housing. The pt requested bus passes at discharge. No further care management needs identified at this time. Care Management will sign off   Final next level of care: Homeless Shelter Barriers to Discharge: Continued Medical Work up   Patient Goals and CMS Choice            Discharge Placement                       Discharge Plan and Services Additional resources added to the After Visit Summary for                                       Social Drivers of Health (SDOH) Interventions SDOH Screenings   Food Insecurity: Food Insecurity Present (10/28/2023)  Housing: High Risk (10/28/2023)  Transportation Needs: Unmet Transportation Needs (10/28/2023)  Utilities: Patient Declined (10/28/2023)  Alcohol  Screen: Medium Risk (11/25/2020)  Depression (PHQ2-9): Low Risk  (11/06/2019)  Social Connections: Socially Isolated (07/20/2023)  Tobacco Use: High Risk (10/28/2023)     Readmission Risk Interventions     No data to display

## 2023-11-12 ENCOUNTER — Other Ambulatory Visit: Payer: Self-pay

## 2023-11-12 ENCOUNTER — Emergency Department (HOSPITAL_COMMUNITY): Payer: MEDICAID

## 2023-11-12 ENCOUNTER — Inpatient Hospital Stay (HOSPITAL_COMMUNITY)
Admission: EM | Admit: 2023-11-12 | Discharge: 2023-11-22 | DRG: 564 | Disposition: A | Discharge status: Rehabilitation Facility | Payer: MEDICAID | Attending: Internal Medicine | Admitting: Internal Medicine

## 2023-11-12 ENCOUNTER — Encounter (HOSPITAL_COMMUNITY): Payer: Self-pay | Admitting: Emergency Medicine

## 2023-11-12 DIAGNOSIS — Z89511 Acquired absence of right leg below knee: Secondary | ICD-10-CM

## 2023-11-12 DIAGNOSIS — F419 Anxiety disorder, unspecified: Secondary | ICD-10-CM | POA: Diagnosis present

## 2023-11-12 DIAGNOSIS — Z89512 Acquired absence of left leg below knee: Secondary | ICD-10-CM | POA: Diagnosis not present

## 2023-11-12 DIAGNOSIS — R0682 Tachypnea, not elsewhere classified: Secondary | ICD-10-CM | POA: Diagnosis present

## 2023-11-12 DIAGNOSIS — E1140 Type 2 diabetes mellitus with diabetic neuropathy, unspecified: Secondary | ICD-10-CM | POA: Diagnosis present

## 2023-11-12 DIAGNOSIS — R6520 Severe sepsis without septic shock: Secondary | ICD-10-CM | POA: Diagnosis not present

## 2023-11-12 DIAGNOSIS — L089 Local infection of the skin and subcutaneous tissue, unspecified: Secondary | ICD-10-CM | POA: Diagnosis present

## 2023-11-12 DIAGNOSIS — M869 Osteomyelitis, unspecified: Secondary | ICD-10-CM | POA: Insufficient documentation

## 2023-11-12 DIAGNOSIS — R578 Other shock: Secondary | ICD-10-CM | POA: Diagnosis not present

## 2023-11-12 DIAGNOSIS — Z604 Social exclusion and rejection: Secondary | ICD-10-CM | POA: Diagnosis present

## 2023-11-12 DIAGNOSIS — Z91199 Patient's noncompliance with other medical treatment and regimen due to unspecified reason: Secondary | ICD-10-CM

## 2023-11-12 DIAGNOSIS — E872 Acidosis, unspecified: Secondary | ICD-10-CM | POA: Diagnosis present

## 2023-11-12 DIAGNOSIS — S0081XA Abrasion of other part of head, initial encounter: Secondary | ICD-10-CM | POA: Diagnosis present

## 2023-11-12 DIAGNOSIS — A419 Sepsis, unspecified organism: Secondary | ICD-10-CM | POA: Diagnosis present

## 2023-11-12 DIAGNOSIS — W050XXA Fall from non-moving wheelchair, initial encounter: Secondary | ICD-10-CM | POA: Diagnosis present

## 2023-11-12 DIAGNOSIS — Y835 Amputation of limb(s) as the cause of abnormal reaction of the patient, or of later complication, without mention of misadventure at the time of the procedure: Secondary | ICD-10-CM | POA: Diagnosis present

## 2023-11-12 DIAGNOSIS — G9341 Metabolic encephalopathy: Secondary | ICD-10-CM | POA: Diagnosis not present

## 2023-11-12 DIAGNOSIS — Z79899 Other long term (current) drug therapy: Secondary | ICD-10-CM | POA: Diagnosis not present

## 2023-11-12 DIAGNOSIS — T8743 Infection of amputation stump, right lower extremity: Secondary | ICD-10-CM | POA: Diagnosis present

## 2023-11-12 DIAGNOSIS — F1010 Alcohol abuse, uncomplicated: Secondary | ICD-10-CM | POA: Diagnosis present

## 2023-11-12 DIAGNOSIS — R6510 Systemic inflammatory response syndrome (SIRS) of non-infectious origin without acute organ dysfunction: Secondary | ICD-10-CM | POA: Diagnosis present

## 2023-11-12 DIAGNOSIS — Z5900 Homelessness unspecified: Secondary | ICD-10-CM

## 2023-11-12 DIAGNOSIS — I70261 Atherosclerosis of native arteries of extremities with gangrene, right leg: Secondary | ICD-10-CM

## 2023-11-12 DIAGNOSIS — E785 Hyperlipidemia, unspecified: Secondary | ICD-10-CM | POA: Diagnosis present

## 2023-11-12 DIAGNOSIS — Z89612 Acquired absence of left leg above knee: Secondary | ICD-10-CM

## 2023-11-12 DIAGNOSIS — F1090 Alcohol use, unspecified, uncomplicated: Secondary | ICD-10-CM | POA: Diagnosis present

## 2023-11-12 DIAGNOSIS — I9589 Other hypotension: Secondary | ICD-10-CM | POA: Diagnosis present

## 2023-11-12 DIAGNOSIS — L309 Dermatitis, unspecified: Secondary | ICD-10-CM | POA: Diagnosis present

## 2023-11-12 DIAGNOSIS — F17210 Nicotine dependence, cigarettes, uncomplicated: Secondary | ICD-10-CM | POA: Diagnosis present

## 2023-11-12 DIAGNOSIS — E7849 Other hyperlipidemia: Secondary | ICD-10-CM | POA: Diagnosis not present

## 2023-11-12 DIAGNOSIS — Z5982 Transportation insecurity: Secondary | ICD-10-CM

## 2023-11-12 DIAGNOSIS — Z91014 Allergy to mammalian meats: Secondary | ICD-10-CM

## 2023-11-12 DIAGNOSIS — D638 Anemia in other chronic diseases classified elsewhere: Secondary | ICD-10-CM | POA: Diagnosis present

## 2023-11-12 DIAGNOSIS — Z5941 Food insecurity: Secondary | ICD-10-CM

## 2023-11-12 DIAGNOSIS — E876 Hypokalemia: Secondary | ICD-10-CM | POA: Diagnosis present

## 2023-11-12 DIAGNOSIS — L8990 Pressure ulcer of unspecified site, unspecified stage: Secondary | ICD-10-CM | POA: Diagnosis present

## 2023-11-12 DIAGNOSIS — M79671 Pain in right foot: Secondary | ICD-10-CM | POA: Diagnosis not present

## 2023-11-12 DIAGNOSIS — I10 Essential (primary) hypertension: Secondary | ICD-10-CM | POA: Diagnosis present

## 2023-11-12 DIAGNOSIS — L8943 Pressure ulcer of contiguous site of back, buttock and hip, stage 3: Secondary | ICD-10-CM | POA: Diagnosis present

## 2023-11-12 DIAGNOSIS — F32A Depression, unspecified: Secondary | ICD-10-CM | POA: Diagnosis present

## 2023-11-12 DIAGNOSIS — L89303 Pressure ulcer of unspecified buttock, stage 3: Secondary | ICD-10-CM | POA: Diagnosis not present

## 2023-11-12 DIAGNOSIS — Z5948 Other specified lack of adequate food: Secondary | ICD-10-CM

## 2023-11-12 DIAGNOSIS — M79604 Pain in right leg: Secondary | ICD-10-CM | POA: Diagnosis present

## 2023-11-12 DIAGNOSIS — R9431 Abnormal electrocardiogram [ECG] [EKG]: Secondary | ICD-10-CM | POA: Diagnosis present

## 2023-11-12 DIAGNOSIS — M8660 Other chronic osteomyelitis, unspecified site: Secondary | ICD-10-CM | POA: Insufficient documentation

## 2023-11-12 DIAGNOSIS — T148XXA Other injury of unspecified body region, initial encounter: Secondary | ICD-10-CM

## 2023-11-12 LAB — COMPREHENSIVE METABOLIC PANEL WITH GFR
ALT: 16 U/L (ref 0–44)
AST: 24 U/L (ref 15–41)
Albumin: 3 g/dL — ABNORMAL LOW (ref 3.5–5.0)
Alkaline Phosphatase: 68 U/L (ref 38–126)
Anion gap: 12 (ref 5–15)
BUN: <5 mg/dL — ABNORMAL LOW (ref 6–20)
CO2: 27 mmol/L (ref 22–32)
Calcium: 8.3 mg/dL — ABNORMAL LOW (ref 8.9–10.3)
Chloride: 96 mmol/L — ABNORMAL LOW (ref 98–111)
Creatinine, Ser: 0.61 mg/dL (ref 0.61–1.24)
GFR, Estimated: >60 mL/min (ref >60)
Glucose, Bld: 103 mg/dL — ABNORMAL HIGH (ref 70–99)
Potassium: 3.4 mmol/L — ABNORMAL LOW (ref 3.5–5.1)
Sodium: 135 mmol/L (ref 135–145)
Total Bilirubin: 0.4 mg/dL (ref 0.0–1.2)
Total Protein: 8 g/dL (ref 6.5–8.1)

## 2023-11-12 LAB — URINALYSIS, W/ REFLEX TO CULTURE (INFECTION SUSPECTED)
Bilirubin Urine: NEGATIVE
Glucose, UA: NEGATIVE mg/dL
Hgb urine dipstick: NEGATIVE
Ketones, ur: NEGATIVE mg/dL
Leukocytes,Ua: NEGATIVE
Nitrite: NEGATIVE
Protein, ur: NEGATIVE mg/dL
Specific Gravity, Urine: 1.005 (ref 1.005–1.030)
pH: 5 (ref 5.0–8.0)

## 2023-11-12 LAB — MAGNESIUM: Magnesium: 2 mg/dL (ref 1.7–2.4)

## 2023-11-12 LAB — CBC WITH DIFFERENTIAL/PLATELET
Abs Immature Granulocytes: 0.02 K/uL (ref 0.00–0.07)
Basophils Absolute: 0.2 K/uL — ABNORMAL HIGH (ref 0.0–0.1)
Basophils Relative: 2 %
Eosinophils Absolute: 0.6 K/uL — ABNORMAL HIGH (ref 0.0–0.5)
Eosinophils Relative: 6 %
HCT: 40 % (ref 39.0–52.0)
Hemoglobin: 13.2 g/dL (ref 13.0–17.0)
Immature Granulocytes: 0 %
Lymphocytes Relative: 37 %
Lymphs Abs: 4 K/uL (ref 0.7–4.0)
MCH: 28.3 pg (ref 26.0–34.0)
MCHC: 33 g/dL (ref 30.0–36.0)
MCV: 85.8 fL (ref 80.0–100.0)
Monocytes Absolute: 1.1 K/uL — ABNORMAL HIGH (ref 0.1–1.0)
Monocytes Relative: 10 %
Neutro Abs: 5 K/uL (ref 1.7–7.7)
Neutrophils Relative %: 45 %
Platelets: 538 K/uL — ABNORMAL HIGH (ref 150–400)
RBC: 4.66 MIL/uL (ref 4.22–5.81)
RDW: 14.8 % (ref 11.5–15.5)
WBC: 10.9 K/uL — ABNORMAL HIGH (ref 4.0–10.5)
nRBC: 0 % (ref 0.0–0.2)

## 2023-11-12 LAB — PROTIME-INR
INR: 1.1 (ref 0.8–1.2)
Prothrombin Time: 14.3 s (ref 11.4–15.2)

## 2023-11-12 LAB — RAPID URINE DRUG SCREEN, HOSP PERFORMED
Amphetamines: NOT DETECTED
Barbiturates: NOT DETECTED
Benzodiazepines: POSITIVE — AB
Cocaine: NOT DETECTED
Opiates: NOT DETECTED
Tetrahydrocannabinol: NOT DETECTED

## 2023-11-12 LAB — I-STAT CG4 LACTIC ACID, ED
Lactic Acid, Venous: 3.4 mmol/L (ref 0.5–1.9)
Lactic Acid, Venous: 4.1 mmol/L (ref 0.5–1.9)

## 2023-11-12 MED ORDER — LACTATED RINGERS IV BOLUS
1000.0000 mL | Freq: Once | INTRAVENOUS | Status: AC
  Administered 2023-11-12: 1000 mL via INTRAVENOUS

## 2023-11-12 MED ORDER — VANCOMYCIN HCL IN DEXTROSE 1-5 GM/200ML-% IV SOLN
1000.0000 mg | Freq: Two times a day (BID) | INTRAVENOUS | Status: DC
  Administered 2023-11-13 (×2): 1000 mg via INTRAVENOUS
  Filled 2023-11-12 (×2): qty 200

## 2023-11-12 MED ORDER — LACTATED RINGERS IV BOLUS
1000.0000 mL | Freq: Once | INTRAVENOUS | Status: AC
  Administered 2023-11-13: 1000 mL via INTRAVENOUS

## 2023-11-12 MED ORDER — SODIUM CHLORIDE 0.9% FLUSH
3.0000 mL | Freq: Two times a day (BID) | INTRAVENOUS | Status: DC
  Administered 2023-11-13 – 2023-11-20 (×16): 3 mL via INTRAVENOUS

## 2023-11-12 MED ORDER — POTASSIUM CHLORIDE CRYS ER 20 MEQ PO TBCR
40.0000 meq | EXTENDED_RELEASE_TABLET | Freq: Once | ORAL | Status: AC
  Administered 2023-11-12: 40 meq via ORAL
  Filled 2023-11-12: qty 2

## 2023-11-12 MED ORDER — LACTATED RINGERS IV SOLN: INTRAVENOUS | Status: AC

## 2023-11-12 MED ORDER — GABAPENTIN 100 MG PO CAPS: 100.0000 mg | ORAL_CAPSULE | Freq: Three times a day (TID) | ORAL | Status: DC

## 2023-11-12 MED ORDER — THIAMINE MONONITRATE 100 MG PO TABS
100.0000 mg | ORAL_TABLET | Freq: Every day | ORAL | Status: DC
  Administered 2023-11-13 – 2023-11-22 (×13): 100 mg via ORAL
  Filled 2023-11-12 (×10): qty 1

## 2023-11-12 MED ORDER — ACETAMINOPHEN 325 MG PO TABS
650.0000 mg | ORAL_TABLET | Freq: Four times a day (QID) | ORAL | Status: DC | PRN
  Administered 2023-11-16 – 2023-11-22 (×3): 650 mg via ORAL
  Filled 2023-11-12 (×2): qty 2

## 2023-11-12 MED ORDER — SODIUM CHLORIDE 0.9 % IV SOLN: 250.0000 mL | INTRAVENOUS | Status: AC | PRN

## 2023-11-12 MED ORDER — SODIUM CHLORIDE 0.9% FLUSH
3.0000 mL | Freq: Two times a day (BID) | INTRAVENOUS | Status: DC
  Administered 2023-11-13 – 2023-11-20 (×14): 3 mL via INTRAVENOUS

## 2023-11-12 MED ORDER — SODIUM CHLORIDE 0.9% FLUSH: 3.0000 mL | INTRAVENOUS | Status: DC | PRN

## 2023-11-12 MED ORDER — ATORVASTATIN CALCIUM 40 MG PO TABS
40.0000 mg | ORAL_TABLET | Freq: Every day | ORAL | Status: DC
  Administered 2023-11-13 – 2023-11-22 (×13): 40 mg via ORAL
  Filled 2023-11-12 (×10): qty 1

## 2023-11-12 MED ORDER — ACETAMINOPHEN 650 MG RE SUPP: 650.0000 mg | Freq: Four times a day (QID) | RECTAL | Status: DC | PRN

## 2023-11-12 MED ORDER — PIPERACILLIN-TAZOBACTAM 3.375 G IVPB
3.3750 g | Freq: Three times a day (TID) | INTRAVENOUS | Status: DC
  Administered 2023-11-13 – 2023-11-14 (×5): 3.375 g via INTRAVENOUS
  Filled 2023-11-12 (×6): qty 50

## 2023-11-12 MED ORDER — VANCOMYCIN HCL 1500 MG/300ML IV SOLN
1500.0000 mg | Freq: Once | INTRAVENOUS | Status: AC
  Administered 2023-11-12: 1500 mg via INTRAVENOUS
  Filled 2023-11-12: qty 300

## 2023-11-12 MED ORDER — PANTOPRAZOLE SODIUM 40 MG PO TBEC: 40.0000 mg | DELAYED_RELEASE_TABLET | Freq: Every day | ORAL | Status: DC

## 2023-11-12 MED ORDER — PIPERACILLIN-TAZOBACTAM 3.375 G IVPB 30 MIN
3.3750 g | Freq: Once | INTRAVENOUS | Status: AC
  Administered 2023-11-12: 3.375 g via INTRAVENOUS
  Filled 2023-11-12: qty 50

## 2023-11-12 NOTE — ED Notes (Signed)
 Patient to CT.

## 2023-11-12 NOTE — H&P (Signed)
 History and Physical    Jake Nguyen FMW:[id number] DOB: 25-May-1965 DOA: 11/12/2023  PCP: Pcp, No   Patient coming from: Patient is homeless   Chief Complaint:  Chief Complaint  Patient presents with   Code Sepsis   ED TRIAGE note:    Patient BIB [facility] from a bus stop for a fall, slid of out wheelchair, has abrasion to forehead.  Patient has possible infection/wound to right limb.  Patient recently admitted for same and discharged with abx but has been non-compliant per EMS.  EMS reports patient is warm to the touch.     78/50 120 HR 93% RA CBG 142      HPI:  Jake Nguyen is a 58 y.o. male with medical history significant of 8/P right BKA 08/2023, left BKA 2022, chronic alcohol  use disorder, anemia of chronic disease, chronic thrombocytopenia, history of recurrent hyponatremia secondary to chronic alcohol  use, anxiety, and depression.  Patient was brought to the ED via EMS as they found a fall at the bus stop.  EMS found patient was hypotensive, tachycardic.  Patient was recently hospitalized 7/18 to 7/28 right BKA with osteomyelitis and recent hospital admission for stump infection on 7/28 treated with IV vancomycin  and ceftriaxone -orthopedic Dr. Harden was consulted recommended continue IV antibiotic to complete 7 days followed by continue oral antibiotics for 7 days patient was discharged home with Augmentin  and doxycycline .  Patient is stating that he has not been compliant with the antibiotic he has been prescribed and discharged with.  Patient did not passed out but he slid out from his wheelchair.  During my evaluation at bedside patient is resting comfortably, I closed and he does not have any interest to any conversation.  Patient is stating that time of the light and let me sleep.  Patient is not cooperative.  Unable to obtain much history from the patient at the bedside.    ED Course:  At presentation to ED patient is tachycardic heart rate 126, hypotensive blood  pressure 93/47 tachypneic 93 O2 sat 100% room air. Initial lactic acid 3.4 trended up to 4.1.  Pending UDS. UA unremarkable. Blood cultures are in process. Normal mag level 2.  Normal pro time INR. CBC showing leukocytosis 10.8 and elevated platelet 532.  Normal H&H.  CMP showing low potassium 3.8, low albumin  3 otherwise unremarkable.  EKG shows sinus tachycardia heart rate 117 and borderline prolonged QTc interval.  CT head no acute intracranial abnormality.  X-ray of the right tibia-fibula rightRight below the knee amputation with soft tissue swelling about the amputation stump. 2. No radiographic evidence of osteomyelitis.  X-ray of the right knee no acute osseous abnormality.  Chest x-ray reflecting atelectasis and infiltrate cannot be excluded.  In the ED patient received 3 L of LR bolus with sepsis protocol and started on vancomycin  and Zosyn .  Also received oral potassium.  ED physician reported that physical exam revealing right knee stump site infection. Hospitalist has been consulted for further evaluation management of sepsis-possibly from right BKA amputation site infection versus pneumonia.   Significant labs in the ED: Lab Orders         Blood Culture (routine x 2)         Respiratory (~20 pathogens) panel by PCR         Expectorated Sputum Assessment w Gram Stain, Rflx to Resp Cult         Comprehensive metabolic panel         CBC with Differential  Protime-INR         Urinalysis, w/ Reflex to Culture (Infection Suspected) -Urine, Clean Catch         Rapid urine drug screen (hospital performed)         Magnesium          Ethanol         Procalcitonin         Lactic acid, plasma         Comprehensive metabolic panel         CBC         Legionella Pneumophila Serogp 1 Ur Ag         Strep pneumoniae urinary antigen         I-Stat Lactic Acid, ED       Review of Systems:  Review of Systems  Constitutional:  Negative for chills, fever and weight loss.   Respiratory:  Negative for cough, sputum production and shortness of breath.   Cardiovascular:  Negative for chest pain and palpitations.  Gastrointestinal:  Negative for heartburn, nausea and vomiting.  Musculoskeletal:  Negative for back pain, falls, joint pain, myalgias and neck pain.  Neurological:  Negative for dizziness and headaches.  Psychiatric/Behavioral:  The patient is not nervous/anxious.     Past Medical History:  Diagnosis Date   Acute metabolic encephalopathy 11/14/2020   Alcohol  abuse with alcohol -induced mood disorder (HCC) 11/10/2019   Alcohol  withdrawal syndrome, with delirium (HCC) 11/06/2019   Alcoholism (HCC)    Anxiety    Depression    Influenza A 05/20/2018   Sepsis (HCC) 11/14/2020   Septic shock (HCC) 11/24/2020    Past Surgical History:  Procedure Laterality Date   ABDOMINAL AORTOGRAM W/LOWER EXTREMITY N/A 08/04/2020   Procedure: ABDOMINAL AORTOGRAM W/LOWER EXTREMITY;  Surgeon: Serene Gaile ORN, MD;  Location: [facility]AS[facility];  Service: Cardiovascular;  Laterality: N/A;   AMPUTATION Left 08/05/2020   Procedure: AMPUTATION BELOW KNEE;  Surgeon: Serene Gaile ORN, MD;  Location: [facility] OR;  Service: Vascular;  Laterality: Left;   AMPUTATION Left 11/21/2020   Procedure: REVISION LEFT ABOVE KNEE AMPUTATION;  Surgeon: Serene Gaile ORN, MD;  Location: [facility] OR;  Service: Vascular;  Laterality: Left;   AMPUTATION Right 08/20/2023   Procedure: AMPUTATION BELOW KNEE;  Surgeon: Harden Jerona GAILS, MD;  Location: [facility] OR;  Service: Orthopedics;  Laterality: Right;   APPLICATION OF WOUND VAC Right 08/20/2023   Procedure: APPLICATION, WOUND VAC;  Surgeon: Harden Jerona GAILS, MD;  Location: [facility] OR;  Service: Orthopedics;  Laterality: Right;     reports that he has been smoking cigarettes. He has never used smokeless tobacco. He reports current alcohol  use. He reports current drug use. Drug: Marijuana.  Allergies  Allergen Reactions   Pork-Derived Products Other (See Comments)     Pt states he doesn't eat pork    Family History  Family history unknown: Yes    Prior to Admission medications   Medication Sig Start Date End Date Taking? Authorizing Provider  acetaminophen  (TYLENOL ) 325 MG tablet Take 2 tablets (650 mg total) by mouth every 6 (six) hours as needed for mild pain (pain score 1-3) or fever (or Fever >/= 101). Patient not taking: Reported on 11/12/2023 11/06/23   Drusilla Sabas RAMAN, MD  amoxicillin -clavulanate (AUGMENTIN ) 875-125 MG tablet Take 1 tablet by mouth every 12 (twelve) hours. Patient not taking: Reported on 11/12/2023 11/06/23   Drusilla Sabas RAMAN, MD  atorvastatin  (LIPITOR) 40 MG tablet Take 1 tablet (40 mg total) by mouth  daily. Patient not taking: Reported on 11/12/2023 09/29/23   Lue Elsie BROCKS, MD  doxycycline  (VIBRA -TABS) 100 MG tablet Take 1 tablet (100 mg total) by mouth every 12 (twelve) hours. Patient not taking: Reported on 11/12/2023 11/06/23   Drusilla Sabas RAMAN, MD  folic acid  (FOLVITE ) 1 MG tablet Take 1 tablet (1 mg total) by mouth daily. Patient not taking: Reported on 11/12/2023 11/06/23   Drusilla Sabas RAMAN, MD  gabapentin  (NEURONTIN ) 100 MG capsule Take 1 capsule (100 mg total) by mouth 3 (three) times daily. 09/29/23   Lue Elsie BROCKS, MD  pantoprazole  (PROTONIX ) 40 MG tablet Take 1 tablet (40 mg total) by mouth daily. 09/30/23   Lue Elsie BROCKS, MD  thiamine  (VITAMIN B1) 100 MG tablet Take 1 tablet (100 mg total) by mouth daily. Patient not taking: Reported on 11/12/2023 11/06/23   Drusilla Sabas RAMAN, MD     Physical Exam: Vitals:   11/12/23 2000 11/12/23 2130 11/12/23 2215 11/12/23 2300  BP: 108/65 (!) 93/47 98/66 91/60   Pulse: (!) 123 (!) 127 (!) 117 (!) 128  Resp: 17 (!) 23 20 20   Temp:      TempSrc:      SpO2: 98% 90% 98% 98%  Weight:        Physical Exam Vitals and nursing note reviewed.  Constitutional:      Appearance: Normal appearance.  HENT:     Mouth/Throat:     Mouth: Mucous membranes are moist.  Eyes:     Pupils:  Pupils are equal, round, and reactive to light.  Cardiovascular:     Rate and Rhythm: Normal rate and regular rhythm.     Pulses: Normal pulses.  Pulmonary:     Effort: Pulmonary effort is normal.     Breath sounds: Normal breath sounds.  Abdominal:     Palpations: Abdomen is soft.  Musculoskeletal:        General: Tenderness present.     Cervical back: Neck supple.     Comments: Right-sided stump area tenderness.  Skin:    Capillary Refill: Capillary refill takes less than 2 seconds.  Neurological:     Mental Status: He is alert and oriented to person, place, and time.  Psychiatric:        Mood and Affect: Mood normal.        Thought Content: Thought content normal.       Media Information   Document Information  Photos    11/12/2023 23:27  Attached To:  Hospital Encounter on 11/12/23  Source Information  Elmus Mathes, MD  [facility]    Media Information   Document Information  Photos    11/12/2023 23:27  Attached To:  Hospital Encounter on 11/12/23  Source Information  Lee Kingfisher, MD  [facility]   Labs on Admission: I have personally reviewed following labs and imaging studies  CBC: Recent Labs  Lab 11/12/23 1923  WBC 10.9*  NEUTROABS 5.0  HGB 13.2  HCT 40.0  MCV 85.8  PLT 538*   Basic Metabolic Panel: Recent Labs  Lab 11/12/23 1923  NA 135  K 3.4*  CL 96*  CO2 27  GLUCOSE 103*  BUN <5*  CREATININE 0.61  CALCIUM  8.3*  MG 2.0   GFR: Estimated Creatinine Clearance: 99.2 mL/min (by C-G formula based on SCr of 0.61 mg/dL). Liver Function Tests: Recent Labs  Lab 11/12/23 1923  AST 24  ALT 16  ALKPHOS 68  BILITOT 0.4  PROT 8.0  ALBUMIN  3.0*  No results for input(s): LIPASE, AMYLASE in the last 168 hours. No results for input(s): AMMONIA in the last 168 hours. Coagulation Profile: Recent Labs  Lab 11/12/23 1923  INR 1.1   Cardiac Enzymes: No results for input(s): CKTOTAL, CKMB,  CKMBINDEX, TROPONINI, TROPONINIHS in the last 168 hours. BNP (last 3 results) Recent Labs    08/21/23 0508 08/22/23 0426 08/23/23 0442  BNP 67.0 32.8 17.4   HbA1C: No results for input(s): HGBA1C in the last 72 hours. CBG: No results for input(s): GLUCAP in the last 168 hours. Lipid Profile: No results for input(s): CHOL, HDL, LDLCALC, TRIG, CHOLHDL, LDLDIRECT in the last 72 hours. Thyroid  Function Tests: No results for input(s): TSH, T4TOTAL, FREET4, T3FREE, THYROIDAB in the last 72 hours. Anemia Panel: No results for input(s): VITAMINB12, FOLATE, FERRITIN, TIBC, IRON , RETICCTPCT in the last 72 hours. Urine analysis:    Component Value Date/Time   COLORURINE STRAW (A) 11/12/2023 2243   APPEARANCEUR CLEAR 11/12/2023 2243   LABSPEC 1.005 11/12/2023 2243   PHURINE 5.0 11/12/2023 2243   GLUCOSEU NEGATIVE 11/12/2023 2243   HGBUR NEGATIVE 11/12/2023 2243   BILIRUBINUR NEGATIVE 11/12/2023 2243   KETONESUR NEGATIVE 11/12/2023 2243   PROTEINUR NEGATIVE 11/12/2023 2243   UROBILINOGEN 0.2 08/02/2009 2144   NITRITE NEGATIVE 11/12/2023 2243   LEUKOCYTESUR NEGATIVE 11/12/2023 2243    Radiological Exams on Admission: I have personally reviewed images CT Head Wo Contrast Result Date: 11/12/2023 EXAM: CT HEAD WITHOUT 11/12/2023 10:25:54 PM TECHNIQUE: CT of the head was performed without the administration of intravenous contrast. Automated exposure control, iterative reconstruction, and/or weight based adjustment of the mA/kV was utilized to reduce the radiation dose to as low as reasonably achievable. COMPARISON: 10/27/2023 CLINICAL HISTORY: Head trauma, abnormal mental status (Age 54-64y). FINDINGS: BRAIN AND VENTRICLES: No acute intracranial hemorrhage. No mass effect or midline shift. No extra-axial fluid collection. Gray-white differentiation is maintained. No hydrocephalus. ORBITS: No acute abnormality. SINUSES AND MASTOIDS: No acute abnormality.  SOFT TISSUES AND SKULL: No acute skull fracture. No acute soft tissue abnormality. IMPRESSION: 1. No acute intracranial abnormality. Electronically signed by: Franky Stanford MD 11/12/2023 10:32 PM EDT RP Workstation: [id number]   DG Tibia/Fibula Right Result Date: 11/12/2023 EXAM: 3 VIEW(S) XRAY OF THE RIGHT TIBIA AND FIBULA 11/12/2023 08:27:00 PM COMPARISON: Same day knee radiographs and MRI. CLINICAL HISTORY: Infection. FINDINGS: BONES AND JOINTS: No acute fracture or dislocation. No radiographic evidence of osteomyelitis; however, radiograph is less sensitive than MRI. SOFT TISSUES: Soft tissue swelling about the amputation stump. Right below the knee amputation. No knee joint effusion. IMPRESSION: 1. Right below the knee amputation with soft tissue swelling about the amputation stump. 2. No radiographic evidence of osteomyelitis. Electronically signed by: Norman Gatlin MD 11/12/2023 08:49 PM EDT RP Workstation: [id number]   DG Chest Port 1 View Result Date: 11/12/2023 CLINICAL DATA:  Concern for sepsis. EXAM: PORTABLE CHEST 1 VIEW COMPARISON:  10/27/2023. FINDINGS: The heart size and mediastinal contours are within normal limits. Mild streaky bibasilar opacities are favored to reflect atelectasis, although, infiltrate cannot be excluded. No pleural effusion or pneumothorax. No acute osseous abnormality. IMPRESSION: Mild streaky bibasilar opacities are favored to reflect atelectasis, although, infiltrate cannot be excluded. Electronically Signed   By: Harrietta Sherry M.D.   On: 11/12/2023 20:05   DG Knee 2 Views Right Result Date: 11/12/2023 CLINICAL DATA:  Right knee wound. History of right below-the-knee amputation. EXAM: RIGHT KNEE - 1-2 VIEW COMPARISON:  10/27/2023 FINDINGS: The distal tibial stump/tibial and fibular transsection margins are  not included within the field of view of this exam. No acute fracture or dislocation. No sizeable joint effusion. Generalized subcutaneous edema of the visualized  right lower extremity. IMPRESSION: 1. No acute osseous abnormality identified. The distal tibial stump/tibial and fibular transsection margins are not included within the field of view of this exam. 2.  Generalized subcutaneous edema of the right lower extremity. Electronically Signed   By: Harrietta Sherry M.D.   On: 11/12/2023 20:02     EKG: My personal interpretation of EKG shows: Sinus tachycardia heart rate 117, right axis deviation and borderline prolonged QTc    Assessment/Plan: Principal Problem:   Sepsis (HCC) Active Problems:   Right BKA Wound infection   Chronic alcohol  use   S/P bilateral BKA (below knee amputation) (HCC)   Depression   History of osteomyelitis (HCC)   Anxiety   Homelessness   Hyperlipidemia    Assessment and Plan: Sepsis secondary to right BKA stump site infection vs pneumonia History of right BKA s/p osteomyelitis and stump infection 10/27/2023 Bilateral lower extremity BKA -Presented emergency department via EMS as patient was [place] at [place] bus stop slid out of his wheelchair on the ground.  EMS found patient was hypotensive.  At presentation to ED patient found tachycardic, hypotensive, elevated lactic acid, leukocytosis and physical exam showed right-sided right BKA stump site infection. -Patient was admitted 7/18 to 7/28 r right knee stump site infection treated with IV Vanco and ceftriaxone  eventually discharged home with oral doxycycline  and Augmentin  however patient is noncompliant due to homelessness. - Patient meets sepsis criteria secondary to right BKA stump site infection versus pneumonia. - CT head no acute intracranial abnormality. -X-ray of the right tibia-fibula rightRight below the knee amputation with soft tissue swelling about the amputation stump. No radiographic evidence of osteomyelitis. - X-ray of the right knee no acute osseous abnormality. - Chest x-ray reflecting atelectasis and infiltrate cannot be excluded. -Per chart review  previous blood culture from 7/19 was negative. -Continue broad-spectrum antibiotic coverage with IV vancomycin  and' Zosyn . - Need to follow-up with respiratory panel, sputum culture, blood culture, procalcitonin level, urine Legionella and urine strep test.  Once pneumonia rules out in that case sepsis would be secondary from stump site infection - Please reach out to orthopedic surgeon Dr. Harden in the daytime.   History of chronic alcohol  use -Unreliable historian.  Pending blood alcohol  level and UDS. - Continue CIWA.  History of depression, anxiety Homelessness  Hyperlipidemia - Continue Lipitor  DVT prophylaxis: None.  Patient has bilateral lower extremity amputation.  Refusing Lovenox /heparin  given history of allergy to pork therapeutic products.     Code Status:  Full Code Diet: Heart healthy diet Disposition Plan: Pending blood cultures result and orthopedics evaluation Consults: Tabetic surgeon Admission status:   Inpatient, Step Down Unit  Severity of Illness: The appropriate patient status for this patient is INPATIENT. Inpatient status is judged to be reasonable and necessary in order to provide the required intensity of service to ensure the patient's safety. The patient's presenting symptoms, physical exam findings, and initial radiographic and laboratory data in the context of their chronic comorbidities is felt to place them at high risk for further clinical deterioration. Furthermore, it is not anticipated that the patient will be medically stable for discharge from the hospital within 2 midnights of admission.   * I certify that at the point of admission it is my clinical judgment that the patient will require inpatient hospital care spanning beyond 2 midnights from  the point of admission due to high intensity of service, high risk for further deterioration and high frequency of surveillance required.DEWAINE    Collin Rengel, MD [facility]  How to contact the [facility]  Attending or Consulting provider 7A - 7P or covering provider during after hours 7P -7A, for this patient.  Check the care team in [facility] and look for a) attending/consulting [facility] provider listed and b) the [facility] team listed Log into [email or web address] and use [place]'s universal password to access. If you do not have the password, please contact the hospital operator. Locate the [facility] provider you are looking for under [facility] and page to a number that you can be directly reached. If you still have difficulty reaching the provider, please page the [facility] (Director on Call) for the Hospitalists listed on amion for assistance.  11/12/2023, 11:34 PM

## 2023-11-12 NOTE — ED Notes (Signed)
 Repeat lactic will be done after second bolus is done.

## 2023-11-12 NOTE — ED Triage Notes (Addendum)
 Patient BIB [facility] from a bus stop for a fall, slid of out wheelchair, has abrasion to forehead.  Patient has possible infection/wound to right limb.  Patient recently admitted for same and discharged with abx but has been non-compliant per EMS.  EMS reports patient is warm to the touch.    78/50 120 HR 93% RA CBG 142

## 2023-11-12 NOTE — ED Notes (Signed)
 Delay on starting zosyn  d/t 1 IV, attempting to obtain second IV at this time.

## 2023-11-12 NOTE — ED Notes (Signed)
 EDP at bedside

## 2023-11-12 NOTE — Progress Notes (Signed)
 Pharmacy Antibiotic Note  Jake Nguyen is a 58 y.o. male for which pharmacy has been consulted for vancomycin  and zosyn  dosing for osteomyelitis.  Patient with a history of alcohol  use disorder, depression/anxiety, homelessness, s/p left AKA (2022), s/p rt BKA (5/25) w/ recurrent stump infection.  SCr 0.61 WBC 10.9; LA 3.4; T 99.1; HR 121; RR 17  Plan: Zosyn  3.375g IV q8h (4 hour infusion) Vancomycin  1500 mg once then 1000 mg q12hr (eAUC 471.1) unless change in renal function Monitor WBC, fever, renal function, cultures De-escalate when able Levels at steady state  Weight: 68 kg (149 lb 14.6 oz)  Temp (24hrs), Avg:98.7 F (37.1 C), Min:98.2 F (36.8 C), Max:99.1 F (37.3 C)  Recent Labs  Lab 11/12/23 1923 11/12/23 1929  WBC 10.9*  --   LATICACIDVEN  --  3.4*    Estimated Creatinine Clearance: 99.2 mL/min (A) (by C-G formula based on SCr of 0.52 mg/dL (L)).    Allergies  Allergen Reactions   Pork-Derived Products Other (See Comments)    Pt states he doesn't eat pork   Microbiology results: Pending  Thank you for allowing pharmacy to be a part of this patient's care.  Dorn Buttner, PharmD, BCPS 11/12/2023 7:50 PM ED Clinical Pharmacist -  [phone]

## 2023-11-12 NOTE — ED Provider Notes (Signed)
[place] EMERGENCY DEPARTMENT AT [facility] Provider Note   CSN: [id number] Arrival date & time: 11/12/23  1859     Patient presents with: Code Sepsis   Jake Nguyen is a 58 y.o. male.   Is a 58 year old male presenting emergency department after sliding out of a [place] the bus stop.  Did hit his head, unsure about LOC.  Complaining of pain to the stump.  EMS noted hypotension and tachycardia.  Patient without chest pain shortness of breath abdominal pain.        Prior to Admission medications   Medication Sig Start Date End Date Taking? Authorizing Provider  acetaminophen  (TYLENOL ) 325 MG tablet Take 2 tablets (650 mg total) by mouth every 6 (six) hours as needed for mild pain (pain score 1-3) or fever (or Fever >/= 101). Patient not taking: Reported on 11/12/2023 11/06/23   Drusilla Sabas RAMAN, MD  amoxicillin -clavulanate (AUGMENTIN ) 875-125 MG tablet Take 1 tablet by mouth every 12 (twelve) hours. Patient not taking: Reported on 11/12/2023 11/06/23   Drusilla Sabas RAMAN, MD  atorvastatin  (LIPITOR) 40 MG tablet Take 1 tablet (40 mg total) by mouth daily. Patient not taking: Reported on 11/12/2023 09/29/23   Lue Elsie BROCKS, MD  doxycycline  (VIBRA -TABS) 100 MG tablet Take 1 tablet (100 mg total) by mouth every 12 (twelve) hours. Patient not taking: Reported on 11/12/2023 11/06/23   Drusilla Sabas RAMAN, MD  folic acid  (FOLVITE ) 1 MG tablet Take 1 tablet (1 mg total) by mouth daily. Patient not taking: Reported on 11/12/2023 11/06/23   Drusilla Sabas RAMAN, MD  gabapentin  (NEURONTIN ) 100 MG capsule Take 1 capsule (100 mg total) by mouth 3 (three) times daily. 09/29/23   Lue Elsie BROCKS, MD  pantoprazole  (PROTONIX ) 40 MG tablet Take 1 tablet (40 mg total) by mouth daily. 09/30/23   Lue Elsie BROCKS, MD  thiamine  (VITAMIN B1) 100 MG tablet Take 1 tablet (100 mg total) by mouth daily. Patient not taking: Reported on 11/12/2023 11/06/23   Drusilla Sabas RAMAN, MD    Allergies: Pork-derived  products    Review of Systems  Updated Vital Signs BP 101/65   Pulse (!) 127   Temp 99.1 F (37.3 C) (Rectal)   Resp 20   Wt 68 kg   SpO2 97%   BMI 21.51 kg/m   Physical Exam Vitals and nursing note reviewed.  HENT:     Head: Normocephalic.     Comments: Superficial abrasion to forehead    Nose: Nose normal.     Mouth/Throat:     Mouth: Mucous membranes are dry.  Eyes:     Conjunctiva/sclera: Conjunctivae normal.  Cardiovascular:     Rate and Rhythm: Regular rhythm. Tachycardia present.  Pulmonary:     Effort: Pulmonary effort is normal. No respiratory distress.     Breath sounds: Normal breath sounds.  Abdominal:     General: Abdomen is flat. There is no distension.     Palpations: Abdomen is soft.     Tenderness: There is no abdominal tenderness. There is no guarding or rebound.  Musculoskeletal:     Comments: Patient right BKA with foul odor, it is edematous, does have some redness and warmth.  Skin:    General: Skin is warm and dry.     Capillary Refill: Capillary refill takes less than 2 seconds.  Neurological:     General: No focal deficit present.     Mental Status: He is alert.     (all labs  ordered are listed, but only abnormal results are displayed) Labs Reviewed  COMPREHENSIVE METABOLIC PANEL WITH GFR - Abnormal; Notable for the following components:      Result Value   Potassium 3.4 (*)    Chloride 96 (*)    Glucose, Bld 103 (*)    BUN <5 (*)    Calcium  8.3 (*)    Albumin  3.0 (*)    All other components within normal limits  CBC WITH DIFFERENTIAL/PLATELET - Abnormal; Notable for the following components:   WBC 10.9 (*)    Platelets 538 (*)    Monocytes Absolute 1.1 (*)    Eosinophils Absolute 0.6 (*)    Basophils Absolute 0.2 (*)    All other components within normal limits  URINALYSIS, W/ REFLEX TO CULTURE (INFECTION SUSPECTED) - Abnormal; Notable for the following components:   Color, Urine STRAW (*)    Bacteria, UA RARE (*)    All other  components within normal limits  RAPID URINE DRUG SCREEN, HOSP PERFORMED - Abnormal; Notable for the following components:   Benzodiazepines POSITIVE (*)    All other components within normal limits  I-STAT CG4 LACTIC ACID, ED - Abnormal; Notable for the following components:   Lactic Acid, Venous 3.4 (*)    All other components within normal limits  I-STAT CG4 LACTIC ACID, ED - Abnormal; Notable for the following components:   Lactic Acid, Venous 4.1 (*)    All other components within normal limits  CULTURE, BLOOD (ROUTINE X 2)  CULTURE, BLOOD (ROUTINE X 2)  RESPIRATORY PANEL BY PCR  EXPECTORATED SPUTUM ASSESSMENT W GRAM STAIN, RFLX TO RESP C  PROTIME-INR  MAGNESIUM   ETHANOL  PROCALCITONIN  LACTIC ACID, PLASMA  LACTIC ACID, PLASMA  COMPREHENSIVE METABOLIC PANEL WITH GFR  CBC  LEGIONELLA PNEUMOPHILA SEROGP 1 UR AG  STREP PNEUMONIAE URINARY ANTIGEN    EKG: EKG Interpretation Date/Time:  Sunday November 12 2023 19:07:08 EDT Ventricular Rate:  117 PR Interval:  160 QRS Duration:  87 QT Interval:  347 QTC Calculation: 485 R Axis:   79  Text Interpretation: Sinus tachycardia Low voltage with right axis deviation Borderline prolonged QT interval Confirmed by Neysa Clap [phone]) on 11/12/2023 7:39:01 PM  Radiology: CT Head Wo Contrast Result Date: 11/12/2023 EXAM: CT HEAD WITHOUT 11/12/2023 10:25:54 PM TECHNIQUE: CT of the head was performed without the administration of intravenous contrast. Automated exposure control, iterative reconstruction, and/or weight based adjustment of the mA/kV was utilized to reduce the radiation dose to as low as reasonably achievable. COMPARISON: 10/27/2023 CLINICAL HISTORY: Head trauma, abnormal mental status (Age 71-64y). FINDINGS: BRAIN AND VENTRICLES: No acute intracranial hemorrhage. No mass effect or midline shift. No extra-axial fluid collection. Gray-white differentiation is maintained. No hydrocephalus. ORBITS: No acute abnormality. SINUSES AND  MASTOIDS: No acute abnormality. SOFT TISSUES AND SKULL: No acute skull fracture. No acute soft tissue abnormality. IMPRESSION: 1. No acute intracranial abnormality. Electronically signed by: Franky Stanford MD 11/12/2023 10:32 PM EDT RP Workstation: [id number]   DG Tibia/Fibula Right Result Date: 11/12/2023 EXAM: 3 VIEW(S) XRAY OF THE RIGHT TIBIA AND FIBULA 11/12/2023 08:27:00 PM COMPARISON: Same day knee radiographs and MRI. CLINICAL HISTORY: Infection. FINDINGS: BONES AND JOINTS: No acute fracture or dislocation. No radiographic evidence of osteomyelitis; however, radiograph is less sensitive than MRI. SOFT TISSUES: Soft tissue swelling about the amputation stump. Right below the knee amputation. No knee joint effusion. IMPRESSION: 1. Right below the knee amputation with soft tissue swelling about the amputation stump. 2. No radiographic evidence of  osteomyelitis. Electronically signed by: Norman Gatlin MD 11/12/2023 08:49 PM EDT RP Workstation: [id number]   DG Chest Port 1 View Result Date: 11/12/2023 CLINICAL DATA:  Concern for sepsis. EXAM: PORTABLE CHEST 1 VIEW COMPARISON:  10/27/2023. FINDINGS: The heart size and mediastinal contours are within normal limits. Mild streaky bibasilar opacities are favored to reflect atelectasis, although, infiltrate cannot be excluded. No pleural effusion or pneumothorax. No acute osseous abnormality. IMPRESSION: Mild streaky bibasilar opacities are favored to reflect atelectasis, although, infiltrate cannot be excluded. Electronically Signed   By: Harrietta Sherry M.D.   On: 11/12/2023 20:05   DG Knee 2 Views Right Result Date: 11/12/2023 CLINICAL DATA:  Right knee wound. History of right below-the-knee amputation. EXAM: RIGHT KNEE - 1-2 VIEW COMPARISON:  10/27/2023 FINDINGS: The distal tibial stump/tibial and fibular transsection margins are not included within the field of view of this exam. No acute fracture or dislocation. No sizeable joint effusion. Generalized  subcutaneous edema of the visualized right lower extremity. IMPRESSION: 1. No acute osseous abnormality identified. The distal tibial stump/tibial and fibular transsection margins are not included within the field of view of this exam. 2.  Generalized subcutaneous edema of the right lower extremity. Electronically Signed   By: Harrietta Sherry M.D.   On: 11/12/2023 20:02     .Critical Care  Performed by: Neysa Caron PARAS, DO Authorized by: Neysa Caron PARAS, DO   Critical care provider statement:    Critical care time (minutes):  74   Critical care was necessary to treat or prevent imminent or life-threatening deterioration of the following conditions:  Sepsis   Critical care was time spent personally by me on the following activities:  Development of treatment plan with patient or surrogate, discussions with consultants, evaluation of patient's response to treatment, examination of patient, ordering and review of laboratory studies, ordering and review of radiographic studies, ordering and performing treatments and interventions, pulse oximetry, re-evaluation of patient's condition and review of old charts .Ultrasound ED Peripheral IV (Provider)  Date/Time: 11/12/2023 11:59 PM  Performed by: Neysa Caron PARAS, DO Authorized by: Neysa Caron PARAS, DO   Procedure details:    Indications: hydration, hypotension and poor IV access     Skin Prep: chlorhexidine  gluconate     Location:  Right AC   Angiocath:  20 G   Bedside Ultrasound Guided: Yes     Images: not archived     Patient tolerated procedure without complications: Yes     Dressing applied: Yes      Medications Ordered in the ED  piperacillin -tazobactam (ZOSYN ) IVPB 3.375 g (0 g Intravenous Stopped 11/12/23 2130)    Followed by  piperacillin -tazobactam (ZOSYN ) IVPB 3.375 g (has no administration in time range)  vancomycin  (VANCOCIN ) IVPB 1000 mg/200 mL premix (has no administration in time range)  pantoprazole  (PROTONIX ) EC tablet 40 mg  (has no administration in time range)  atorvastatin  (LIPITOR) tablet 40 mg (has no administration in time range)  gabapentin  (NEURONTIN ) capsule 100 mg (has no administration in time range)  thiamine  (VITAMIN B1) tablet 100 mg (has no administration in time range)  sodium chloride  flush (NS) 0.9 % injection 3 mL (has no administration in time range)  lactated ringers  infusion (has no administration in time range)  sodium chloride  flush (NS) 0.9 % injection 3 mL (has no administration in time range)  sodium chloride  flush (NS) 0.9 % injection 3 mL (has no administration in time range)  0.9 %  sodium chloride  infusion (has no administration in  time range)  acetaminophen  (TYLENOL ) tablet 650 mg (has no administration in time range)    Or  acetaminophen  (TYLENOL ) suppository 650 mg (has no administration in time range)  lactated ringers  bolus 1,000 mL (has no administration in time range)  lactated ringers  bolus 1,000 mL (0 mLs Intravenous Stopped 11/12/23 2057)  lactated ringers  bolus 1,000 mL (0 mLs Intravenous Stopped 11/12/23 2236)  vancomycin  (VANCOREADY) IVPB 1500 mg/300 mL (0 mg Intravenous Stopped 11/12/23 2350)  potassium chloride  SA (KLOR-CON  M) CR tablet 40 mEq (40 mEq Oral Given 11/12/23 2054)  lactated ringers  bolus 1,000 mL (1,000 mLs Intravenous New Bag/Given 11/12/23 2231)    Clinical Course as of 11/13/23 0001  Sun Nov 12, 2023  1936 Patient with tachycardia, elevated lactate and a new leukocytosis with chronic wound to his right stump with recent admission for osteomyelitis.  Questionable compliance with antibiotics he was discharged on.  Will start Vanco and Zosyn  [TY]  1938 Per my independent chart review appears echo in 2022 had normal EF [TY]  2018 DG Chest Port 1 View MPRESSION: Mild streaky bibasilar opacities are favored to reflect atelectasis, although, infiltrate cannot be excluded.   Electronically Signed   By: Harrietta Sherry M.D.   On: 11/12/2023 20:05   [TY]  2249  Bp 103/57 currently.  [TY]  Mon Nov 13, 2023  0000 Will admit patient for sepsis.  Blood pressure continues to be soft, uptrending lactate.  Continues to mentate well.  Discussed with hospitalist who will admit patient. [TY]    Clinical Course User Index [TY] Neysa Caron PARAS, DO                                 Medical Decision Making This is a 58 year old male with complex past medical history to include severe alcohol  use disorder, depression/anxiety, homelessness, s/p left AKA (2022), s/p right BKA (08/20/2023) with recurrent stump site infection.  Concerning vital signs per EMS with blood pressure in the 6 upper 70s systolic.  And tachycardia with heart rate in the 120s.  Appears to have poor hygiene, small abrasion to forehead, but no other obvious injuries from falling out of his wheelchair.  His right stump does look like it has infection, per chart review recent hospitalization for the same and was supposed to be on antibiotics.  Questionable compliance as he reports that he took the medications to me, but told EMS he has not been taking the medications.  Given what looks like an infectious source with elevated heart rate and low blood pressure will get sepsis workup.  Will get CT head to evaluate for intracranial trauma.  Will give IV fluids.  See course for further MDM and disposition  Amount and/or Complexity of Data Reviewed External Data Reviewed:     Details: See above Labs: ordered. Decision-making details documented in ED Course. Radiology: ordered and independent interpretation performed. Decision-making details documented in ED Course. ECG/medicine tests: independent interpretation performed.    Details: Sinus tachycardia.  No ST segment changes to indicate ischemia  Risk Prescription drug management. Decision regarding hospitalization. Diagnosis or treatment significantly limited by social determinants of health. Risk Details: Homelessness, alcohol  abuse       Final  diagnoses:  Sepsis, due to unspecified organism, unspecified whether acute organ dysfunction present [facility])    ED Discharge Orders     None          Neysa Caron PARAS, DO 11/13/23  0001  

## 2023-11-12 NOTE — ED Notes (Signed)
 Imaging at bedside.

## 2023-11-12 NOTE — Sepsis Progress Note (Signed)
 Elink following for sepsis protocol.

## 2023-11-13 ENCOUNTER — Inpatient Hospital Stay (HOSPITAL_COMMUNITY): Payer: MEDICAID

## 2023-11-13 ENCOUNTER — Other Ambulatory Visit (HOSPITAL_COMMUNITY): Payer: Self-pay

## 2023-11-13 DIAGNOSIS — L89303 Pressure ulcer of unspecified buttock, stage 3: Secondary | ICD-10-CM | POA: Diagnosis not present

## 2023-11-13 DIAGNOSIS — G9341 Metabolic encephalopathy: Secondary | ICD-10-CM | POA: Diagnosis not present

## 2023-11-13 DIAGNOSIS — R578 Other shock: Secondary | ICD-10-CM

## 2023-11-13 DIAGNOSIS — Z89511 Acquired absence of right leg below knee: Secondary | ICD-10-CM

## 2023-11-13 DIAGNOSIS — I70261 Atherosclerosis of native arteries of extremities with gangrene, right leg: Secondary | ICD-10-CM

## 2023-11-13 DIAGNOSIS — R6520 Severe sepsis without septic shock: Secondary | ICD-10-CM

## 2023-11-13 DIAGNOSIS — E785 Hyperlipidemia, unspecified: Secondary | ICD-10-CM

## 2023-11-13 DIAGNOSIS — A419 Sepsis, unspecified organism: Secondary | ICD-10-CM | POA: Diagnosis not present

## 2023-11-13 DIAGNOSIS — E876 Hypokalemia: Secondary | ICD-10-CM

## 2023-11-13 LAB — CBC
HCT: 31.8 % — ABNORMAL LOW (ref 39.0–52.0)
Hemoglobin: 10.4 g/dL — ABNORMAL LOW (ref 13.0–17.0)
MCH: 28 pg (ref 26.0–34.0)
MCHC: 32.7 g/dL (ref 30.0–36.0)
MCV: 85.5 fL (ref 80.0–100.0)
Platelets: 428 K/uL — ABNORMAL HIGH (ref 150–400)
RBC: 3.72 MIL/uL — ABNORMAL LOW (ref 4.22–5.81)
RDW: 14.9 % (ref 11.5–15.5)
WBC: 9.1 K/uL (ref 4.0–10.5)
nRBC: 0 % (ref 0.0–0.2)

## 2023-11-13 LAB — ECHOCARDIOGRAM COMPLETE
S' Lateral: 2.5 cm
Weight: 2451.52 [oz_av]

## 2023-11-13 LAB — RESPIRATORY PANEL BY PCR

## 2023-11-13 LAB — COMPREHENSIVE METABOLIC PANEL WITH GFR
ALT: 13 U/L (ref 0–44)
AST: 20 U/L (ref 15–41)
Albumin: 2.5 g/dL — ABNORMAL LOW (ref 3.5–5.0)
Alkaline Phosphatase: 55 U/L (ref 38–126)
Anion gap: 11 (ref 5–15)
BUN: <5 mg/dL — ABNORMAL LOW (ref 6–20)
CO2: 27 mmol/L (ref 22–32)
Calcium: 8.2 mg/dL — ABNORMAL LOW (ref 8.9–10.3)
Chloride: 98 mmol/L (ref 98–111)
Creatinine, Ser: 0.52 mg/dL — ABNORMAL LOW (ref 0.61–1.24)
GFR, Estimated: >60 mL/min
Glucose, Bld: 123 mg/dL — ABNORMAL HIGH (ref 70–99)
Potassium: 3.7 mmol/L (ref 3.5–5.1)
Sodium: 136 mmol/L (ref 135–145)
Total Bilirubin: 0.9 mg/dL (ref 0.0–1.2)
Total Protein: 5.8 g/dL — ABNORMAL LOW (ref 6.5–8.1)

## 2023-11-13 LAB — CORTISOL: Cortisol, Plasma: 12.4 ug/dL

## 2023-11-13 LAB — STREP PNEUMONIAE URINARY ANTIGEN: Strep Pneumo Urinary Antigen: NEGATIVE

## 2023-11-13 LAB — TYPE AND SCREEN
ABO/RH(D): B POS
Antibody Screen: NEGATIVE

## 2023-11-13 LAB — ETHANOL: Alcohol, Ethyl (B): <15 mg/dL (ref <15)

## 2023-11-13 LAB — LACTIC ACID, PLASMA
Lactic Acid, Venous: 3.1 mmol/L (ref 0.5–1.9)
Lactic Acid, Venous: 2.5 mmol/L (ref 0.5–1.9)

## 2023-11-13 LAB — SURGICAL PCR SCREEN
MRSA, PCR: NEGATIVE
Staphylococcus aureus: POSITIVE — AB

## 2023-11-13 LAB — PROCALCITONIN: Procalcitonin: <0.1 ng/mL

## 2023-11-13 LAB — MAGNESIUM: Magnesium: 1.5 mg/dL — ABNORMAL LOW (ref 1.7–2.4)

## 2023-11-13 MED ORDER — CHLORHEXIDINE GLUCONATE CLOTH 2 % EX PADS
6.0000 | MEDICATED_PAD | Freq: Every day | CUTANEOUS | Status: DC
  Administered 2023-11-13 – 2023-11-22 (×13): 6 via TOPICAL

## 2023-11-13 MED ORDER — MAGNESIUM SULFATE 4 GM/100ML IV SOLN
4.0000 g | Freq: Once | INTRAVENOUS | Status: AC
  Administered 2023-11-13: 4 g via INTRAVENOUS
  Filled 2023-11-13: qty 100

## 2023-11-13 MED ORDER — IOHEXOL 350 MG/ML SOLN
75.0000 mL | Freq: Once | INTRAVENOUS | Status: AC | PRN
  Administered 2023-11-13: 75 mL via INTRAVENOUS

## 2023-11-13 MED ORDER — ORAL CARE MOUTH RINSE: 15.0000 mL | OROMUCOSAL | Status: DC | PRN

## 2023-11-13 MED ORDER — MEDIHONEY WOUND/BURN DRESSING EX PSTE
1.0000 | PASTE | Freq: Every day | CUTANEOUS | Status: DC
  Administered 2023-11-13 – 2023-11-22 (×13): 1 via TOPICAL
  Filled 2023-11-13 (×2): qty 44

## 2023-11-13 MED ORDER — ORAL CARE MOUTH RINSE
15.0000 mL | OROMUCOSAL | Status: DC
  Administered 2023-11-13 – 2023-11-18 (×13): 15 mL via OROMUCOSAL

## 2023-11-13 MED ORDER — DOCUSATE SODIUM 100 MG PO CAPS
100.0000 mg | ORAL_CAPSULE | Freq: Two times a day (BID) | ORAL | Status: DC | PRN
  Administered 2023-11-16: 100 mg via ORAL
  Filled 2023-11-13: qty 1

## 2023-11-13 MED ORDER — POLYETHYLENE GLYCOL 3350 17 G PO PACK
17.0000 g | PACK | Freq: Every day | ORAL | Status: DC | PRN
Start: 2023-11-13 — End: 2023-11-22
  Administered 2023-11-16: 17 g via ORAL
  Filled 2023-11-13: qty 1

## 2023-11-13 MED ORDER — MELATONIN 5 MG PO TABS
5.0000 mg | ORAL_TABLET | Freq: Every day | ORAL | Status: DC
  Administered 2023-11-14 – 2023-11-21 (×10): 5 mg via ORAL
  Filled 2023-11-13 (×8): qty 1

## 2023-11-13 MED ORDER — POTASSIUM CHLORIDE CRYS ER 20 MEQ PO TBCR
40.0000 meq | EXTENDED_RELEASE_TABLET | Freq: Once | ORAL | Status: AC
  Administered 2023-11-13: 40 meq via ORAL
  Filled 2023-11-13: qty 2

## 2023-11-13 MED ORDER — LACTATED RINGERS IV BOLUS
1000.0000 mL | Freq: Once | INTRAVENOUS | Status: AC
  Administered 2023-11-13: 1000 mL via INTRAVENOUS

## 2023-11-13 MED ORDER — MUPIROCIN 2 % EX OINT
1.0000 | TOPICAL_OINTMENT | Freq: Two times a day (BID) | CUTANEOUS | Status: AC
  Administered 2023-11-13 – 2023-11-17 (×9): 1 via NASAL
  Filled 2023-11-13 (×4): qty 22

## 2023-11-13 MED ORDER — ADULT MULTIVITAMIN W/MINERALS CH
1.0000 | ORAL_TABLET | Freq: Every day | ORAL | Status: DC
  Administered 2023-11-13 – 2023-11-21 (×11): 1 via ORAL
  Filled 2023-11-13 (×9): qty 1

## 2023-11-13 MED ORDER — TRAZODONE HCL 50 MG PO TABS
50.0000 mg | ORAL_TABLET | Freq: Every evening | ORAL | Status: DC | PRN
  Administered 2023-11-14 – 2023-11-21 (×4): 50 mg via ORAL
  Filled 2023-11-13 (×4): qty 1

## 2023-11-13 MED ORDER — FOLIC ACID 1 MG PO TABS
1.0000 mg | ORAL_TABLET | Freq: Every day | ORAL | Status: DC
  Administered 2023-11-13 – 2023-11-22 (×13): 1 mg via ORAL
  Filled 2023-11-13 (×10): qty 1

## 2023-11-13 MED ORDER — MEDIHONEY WOUND/BURN DRESSING EX PSTE: 1.0000 | PASTE | Freq: Every day | CUTANEOUS | Status: DC

## 2023-11-13 MED ORDER — SODIUM CHLORIDE 0.9 % IV SOLN: 250.0000 mL | INTRAVENOUS | Status: AC

## 2023-11-13 MED ORDER — NOREPINEPHRINE 4 MG/250ML-% IV SOLN: 0.0000 ug/min | INTRAVENOUS | Status: DC

## 2023-11-13 MED ORDER — GERHARDT'S BUTT CREAM
TOPICAL_CREAM | Freq: Two times a day (BID) | CUTANEOUS | Status: DC
  Administered 2023-11-16: 1 via TOPICAL
  Filled 2023-11-13 (×2): qty 60

## 2023-11-13 MED ORDER — POTASSIUM & SODIUM PHOSPHATES 280-160-250 MG PO PACK
1.0000 | PACK | ORAL | Status: AC
  Administered 2023-11-13 (×2): 1 via ORAL
  Filled 2023-11-13 (×2): qty 1

## 2023-11-13 MED ORDER — MIDODRINE HCL 5 MG PO TABS
10.0000 mg | ORAL_TABLET | Freq: Three times a day (TID) | ORAL | Status: DC
  Administered 2023-11-13 – 2023-11-19 (×20): 10 mg via ORAL
  Filled 2023-11-13 (×20): qty 2

## 2023-11-13 MED ORDER — ALBUMIN HUMAN 25 % IV SOLN
25.0000 g | INTRAVENOUS | Status: AC
  Administered 2023-11-13: 25 g via INTRAVENOUS
  Filled 2023-11-13: qty 100

## 2023-11-13 NOTE — Progress Notes (Signed)
*  PRELIMINARY RESULTS* Echocardiogram 2D Echocardiogram has been performed.  Jake Nguyen Stallion 11/13/2023, 8:57 AM

## 2023-11-13 NOTE — Progress Notes (Signed)
 NAME:  Jake Nguyen, MRN:  [id number], DOB:  [birth year], LOS: 1 ADMISSION DATE:  11/12/2023, CONSULTATION DATE:  11/13/23 REFERRING MD:  [facility], CHIEF COMPLAINT:  hypotension   History of Present Illness:  58 yo, currently unhoused, pt presented to ed via EMS from bus stop after fall where he slid from his wheelchair, similar to previous where he slid down and sustained an abrasion on forehead, healing at this time.  This recently occurred. Pt has h.o bilateral bka, most recent being 08/2023 R BKA and as recently admitted (for similar) and discharged with oral abx. He did not acquire or take the abx prescribed. Pt.s only complaint is pain in his R le. He states that it has been hurting since his was last discharged and has gotten worse since, and more malodorous over the past few days.   Upon presentation pt was noted to be tachycardic and hypotensive. He was given IVF as well as albumin , and midodrine . Pt's uds likewise was + for benzo, which he is not prescribed. He denies any recent fever/chills/denies syncope, no n/v/d, no cp, headache. Lactate initially worsening to >4 but now downtrending with resuscitation.  Since arrival pt has been poor historian, refusing to converse or provide ROS. Consistently stating to multiple providers to just let me sleep  Pertinent  Medical History  Unhoused population Anxiety Eoth abuse  Substance abujse Depression B/l BKA (R 2025,  L 2022) T2dm with hyperglycemia Diabetic neuropathy hyperlipidemia   Significant Hospital Events: Including procedures, antibiotic start and stop dates in addition to other pertinent events   Admitted to [facility] 8/3, transferred to ICU with persistent hypotension 8/4  Interim History / Subjective:  RLE pain, but denies other complaints.   Objective    Blood pressure 117/64, pulse (!) 119, temperature 99.2 F (37.3 C), temperature source Oral, resp. rate 15, weight 69.5 kg, SpO2 90%.        Intake/Output Summary (Last 24  hours) at 11/13/2023 0739 Last data filed at 11/13/2023 0645 Gross per 24 hour  Intake 5127.58 ml  Output 3900 ml  Net 1227.58 ml   Filed Weights   11/12/23 1901 11/13/23 0430  Weight: 68 kg 69.5 kg    Examination: General: chronically ill appearing man lying in bed in NAD HENT:  [place]/AT, eyes anicteric Lungs: breathing comfortably on RA. CTAB. Cardiovascular: S1S2, tachycardic Abdomen: soft, NT Extremities: RLE stump with bloody drainage on sheets-- has a few areas of abrasions but no clear source of bleeding. No purulent drainage. Some fluctuance & warmth. Neuro: awake, alert, moving extremities   Blood cultures NGTD RVP negative  LA 3.4>4.1>3.1 UDS: + benzo AM labs ordered.  Resolved problem list   Assessment and Plan  Acute hypotension, multifactorial Sepsis 2/2 infected R LE stump Wound infection of above -CT R femur ordered to assess for abscess -Ortho consulted -follow cultures -con't broad spectrum antibiotics -if hypotensive start vasopressors- does not appear it was started -con't midodrine  -echocardiogram ordered -will wait to start DVT prophylaxis (DOAC; doesn't do heparinoids) until we are sure no surgical intervention is needed  Hypokalemia -waiting on repeat AM labs  Hyperlipidemia -con't statin  Substance abuse Etoh abuse -CIWA; monitor for withdrawal -vitamins  Lactic acidosis; concern for incomplete source control -monitor  Pressure injuries all present on admission-- RLE stump moisture injury full thickness & bilateral ischium -wound care per team's recommendations, turns  Best Practice (right click and Reselect all SmartList Selections daily)   Diet/type: Regular consistency (see orders) DVT prophylaxis DOAC--  prophylactic dosing (no heparin  for religious reasons) Pressure ulcer(s): present on admission  GI prophylaxis: n/a Lines: N/A Foley:  N/A Code Status:  full code Last date of multidisciplinary goals of care discussion  [--]  Labs   CBC: Recent Labs  Lab 11/12/23 1923  WBC 10.9*  NEUTROABS 5.0  HGB 13.2  HCT 40.0  MCV 85.8  PLT 538*    Basic Metabolic Panel: Recent Labs  Lab 11/12/23 1923  NA 135  K 3.4*  CL 96*  CO2 27  GLUCOSE 103*  BUN <5*  CREATININE 0.61  CALCIUM  8.3*  MG 2.0   GFR: Estimated Creatinine Clearance: 101.4 mL/min (by C-G formula based on SCr of 0.61 mg/dL). Recent Labs  Lab 11/12/23 1923 11/12/23 1929 11/12/23 2256 11/12/23 2313  WBC 10.9*  --   --   --   LATICACIDVEN  --  3.4* 4.1* 3.1*    Liver Function Tests: Recent Labs  Lab 11/12/23 1923  AST 24  ALT 16  ALKPHOS 68  BILITOT 0.4  PROT 8.0  ALBUMIN  3.0*      Critical care time:       Leita SHAUNNA Gaskins, DO 11/13/23 9:10 AM [facility]  For contact information, see Amion. If no response to pager, please call PCCM consult pager. After hours, 7PM- 7AM, please call Elink.

## 2023-11-13 NOTE — ED Notes (Signed)
 Nurse will collect labs.  KM

## 2023-11-13 NOTE — Consult Note (Signed)
 NAME:  Jake Nguyen, MRN:  [id number], DOB:  1966/01/03, LOS: 1 ADMISSION DATE:  11/12/2023, CONSULTATION DATE:  11/13/23 REFERRING MD:  [facility], CHIEF COMPLAINT:  hypotension   History of Present Illness:  58 yo, currently unhoused, pt presented to ed via EMS from bus stop after fall where he slid from his wheelchair, similar to previous where he slid down and sustained an abrasion on forehead, healing at this time.  This recently occurred. Pt has h.o bilateral bka, most recent being 08/2023 R BKA and as recently admitted (for similar) and discharged with oral abx. He did not acquire or take the abx prescribed. Pt.s only complaint is pain in his R le. He states that it has been hurting since his was last discharged and has gotten worse since, and more malodorous over the past few days.   Upon presentation pt was noted to be tachycardic and hypotensive. He was given IVF as well as albumin , and midodrine . Pt's uds likewise was + for benzo, which he is not prescribed. He denies any recent fever/chills/denies syncope, no n/v/d, no cp, headache. Lactate initially worsening to >4 but now downtrending with resuscitation.  Since arrival pt has been poor historian, refusing to converse or provide ROS. Consistently stating to multiple providers to just let me sleep  Pertinent  Medical History  Unhoused population Anxiety Eoth abuse  Substance abujse Depression B/l BKA (R 2025,  L 2022) T2dm with hyperglycemia Diabetic neuropathy hyperlipidemia   Significant Hospital Events: Including procedures, antibiotic start and stop dates in addition to other pertinent events   Admitted to [facility] 8/3, transferred to ICU with persistent hypotension 8/4  Interim History / Subjective:    Objective    Blood pressure (!) 105/54, pulse (!) 130, temperature 100.3 F (37.9 C), temperature source Oral, resp. rate 20, weight 68 kg, SpO2 98%.        Intake/Output Summary (Last 24 hours) at 11/13/2023 0246 Last data  filed at 11/13/2023 0036 Gross per 24 hour  Intake 3352.62 ml  Output 1000 ml  Net 2352.62 ml   Filed Weights   11/12/23 1901  Weight: 68 kg    Examination: General: awake alert oriented appears chronically ill HENT: ncat eomi, perrla, sclera anicteric Lungs: ctab Cardiovascular: tachy, regular Abdomen: soft nt,nd bs+ Extremities: b/l le Bka. R with increased warmth and notable discoloration, no upper extremity c/c Neuro: no focal deficits GU: deferred  Resolved problem list   Assessment and Plan  Acute hypotension, multifactorial Sepsis 2/2 infected R LE stump Wound infection of above -suspect 2/2 sepsis with LE stump infection, dehydration, and drug use -blood cx pending -abx empiric, de-escalate as able -ortho consulted -wound care consult -cont volume -check cortisol -titrate vasopressors to map >65 -cont midodrine  -npo after midnight should there be debridement -echo in am Hypokalemia -replace Hyperlipidemia Substance abuse Etoh abuse -cont home meds -monitor for withdrawal Lactic acidosis -improving somewhat with volume resuscitation   Best Practice (right click and Reselect all SmartList Selections daily)   Diet/type: NPO w/ oral meds DVT prophylaxis other Pressure ulcer(s): present on admission  GI prophylaxis: PPI Lines: N/A Foley:  N/A Code Status:  full code Last date of multidisciplinary goals of care discussion [--]  Labs   CBC: Recent Labs  Lab 11/12/23 1923  WBC 10.9*  NEUTROABS 5.0  HGB 13.2  HCT 40.0  MCV 85.8  PLT 538*    Basic Metabolic Panel: Recent Labs  Lab 11/12/23 1923  NA 135  K 3.4*  CL 96*  CO2 27  GLUCOSE 103*  BUN <5*  CREATININE 0.61  CALCIUM  8.3*  MG 2.0   GFR: Estimated Creatinine Clearance: 99.2 mL/min (by C-G formula based on SCr of 0.61 mg/dL). Recent Labs  Lab 11/12/23 1923 11/12/23 1929 11/12/23 2256 11/12/23 2313  WBC 10.9*  --   --   --   LATICACIDVEN  --  3.4* 4.1* 3.1*    Liver  Function Tests: Recent Labs  Lab 11/12/23 1923  AST 24  ALT 16  ALKPHOS 68  BILITOT 0.4  PROT 8.0  ALBUMIN  3.0*   No results for input(s): LIPASE, AMYLASE in the last 168 hours. No results for input(s): AMMONIA in the last 168 hours.  ABG    Component Value Date/Time   TCO2 25 08/02/2009 2156     Coagulation Profile: Recent Labs  Lab 11/12/23 1923  INR 1.1    Cardiac Enzymes: No results for input(s): CKTOTAL, CKMB, CKMBINDEX, TROPONINI in the last 168 hours.  HbA1C: Hgb A1c MFr Bld  Date/Time Value Ref Range Status  07/20/2023 08:33 PM 5.6 4.8 - 5.6 % Final    Comment:    (NOTE) Pre diabetes:          5.7%-6.4%  Diabetes:              >6.4%  Glycemic control for   <7.0% adults with diabetes   07/31/2020 10:36 AM 6.6 (H) 4.8 - 5.6 % Final    Comment:    (NOTE) Pre diabetes:          5.7%-6.4%  Diabetes:              >6.4%  Glycemic control for   <7.0% adults with diabetes     CBG: No results for input(s): GLUCAP in the last 168 hours.  Review of Systems:   As per HPI  Past Medical History:  He,  has a past medical history of Acute metabolic encephalopathy (11/14/2020), Alcohol  abuse with alcohol -induced mood disorder (HCC) (11/10/2019), Alcohol  withdrawal syndrome, with delirium (HCC) (11/06/2019), Alcoholism (HCC), Anxiety, Depression, Influenza A (05/20/2018), Sepsis (HCC) (11/14/2020), and Septic shock (HCC) (11/24/2020).   Surgical History:   Past Surgical History:  Procedure Laterality Date   ABDOMINAL AORTOGRAM W/LOWER EXTREMITY N/A 08/04/2020   Procedure: ABDOMINAL AORTOGRAM W/LOWER EXTREMITY;  Surgeon: Serene Gaile ORN, MD;  Location: [facility];  Service: Cardiovascular;  Laterality: N/A;   AMPUTATION Left 08/05/2020   Procedure: AMPUTATION BELOW KNEE;  Surgeon: Serene Gaile ORN, MD;  Location: [facility] OR;  Service: Vascular;  Laterality: Left;   AMPUTATION Left 11/21/2020   Procedure: REVISION LEFT ABOVE KNEE  AMPUTATION;  Surgeon: Serene Gaile ORN, MD;  Location: [facility] OR;  Service: Vascular;  Laterality: Left;   AMPUTATION Right 08/20/2023   Procedure: AMPUTATION BELOW KNEE;  Surgeon: Harden Jerona GAILS, MD;  Location: [facility] OR;  Service: Orthopedics;  Laterality: Right;   APPLICATION OF WOUND VAC Right 08/20/2023   Procedure: APPLICATION, WOUND VAC;  Surgeon: Harden Jerona GAILS, MD;  Location: [facility] OR;  Service: Orthopedics;  Laterality: Right;     Social History:   reports that he has been smoking cigarettes. He has never used smokeless tobacco. He reports current alcohol  use. He reports current drug use. Drug: Marijuana.   Family History:  His Family history is unknown by patient.   Allergies Allergies  Allergen Reactions   Pork-Derived Products Other (See Comments)    Pt states he doesn't eat pork     Home  Medications  Prior to Admission medications   Medication Sig Start Date End Date Taking? Authorizing Provider  acetaminophen  (TYLENOL ) 325 MG tablet Take 2 tablets (650 mg total) by mouth every 6 (six) hours as needed for mild pain (pain score 1-3) or fever (or Fever >/= 101). Patient not taking: Reported on 11/12/2023 11/06/23   Drusilla Sabas RAMAN, MD  amoxicillin -clavulanate (AUGMENTIN ) 875-125 MG tablet Take 1 tablet by mouth every 12 (twelve) hours. Patient not taking: Reported on 11/12/2023 11/06/23   Drusilla Sabas RAMAN, MD  atorvastatin  (LIPITOR) 40 MG tablet Take 1 tablet (40 mg total) by mouth daily. Patient not taking: Reported on 11/12/2023 09/29/23   Lue Elsie BROCKS, MD  doxycycline  (VIBRA -TABS) 100 MG tablet Take 1 tablet (100 mg total) by mouth every 12 (twelve) hours. Patient not taking: Reported on 11/12/2023 11/06/23   Drusilla Sabas RAMAN, MD  folic acid  (FOLVITE ) 1 MG tablet Take 1 tablet (1 mg total) by mouth daily. Patient not taking: Reported on 11/12/2023 11/06/23   Drusilla Sabas RAMAN, MD  gabapentin  (NEURONTIN ) 100 MG capsule Take 1 capsule (100 mg total) by mouth 3 (three) times daily. Patient not  taking: Reported on 11/13/2023 09/29/23   Lue Elsie BROCKS, MD  pantoprazole  (PROTONIX ) 40 MG tablet Take 1 tablet (40 mg total) by mouth daily. Patient not taking: Reported on 11/13/2023 09/30/23   Lue Elsie BROCKS, MD  thiamine  (VITAMIN B1) 100 MG tablet Take 1 tablet (100 mg total) by mouth daily. Patient not taking: Reported on 11/12/2023 11/06/23   Drusilla Sabas RAMAN, MD     Critical care time: 

## 2023-11-13 NOTE — Consult Note (Signed)
 ORTHOPAEDIC CONSULTATION  REQUESTING PHYSICIAN: Jake Leita SQUIBB, DO  Chief Complaint: 2-week history of pain right below-knee amputation.  HPI: Jake Nguyen is a 58 y.o. male who presents with ischemic changes to the right transtibial amputation.  Patient states he has been having increased pain for the past 2 weeks.  Patient is also a left above-knee amputee.  Past Medical History:  Diagnosis Date   Acute metabolic encephalopathy 11/14/2020   Alcohol  abuse with alcohol -induced mood disorder (HCC) 11/10/2019   Alcohol  withdrawal syndrome, with delirium (HCC) 11/06/2019   Alcoholism (HCC)    Anxiety    Depression    Influenza A 05/20/2018   Sepsis (HCC) 11/14/2020   Septic shock (HCC) 11/24/2020   Past Surgical History:  Procedure Laterality Date   ABDOMINAL AORTOGRAM W/LOWER EXTREMITY N/A 08/04/2020   Procedure: ABDOMINAL AORTOGRAM W/LOWER EXTREMITY;  Surgeon: Jake Gaile ORN, MD;  Location: [facility];  Service: Cardiovascular;  Laterality: N/A;   AMPUTATION Left 08/05/2020   Procedure: AMPUTATION BELOW KNEE;  Surgeon: Jake Gaile ORN, MD;  Location: [facility] OR;  Service: Vascular;  Laterality: Left;   AMPUTATION Left 11/21/2020   Procedure: REVISION LEFT ABOVE KNEE AMPUTATION;  Surgeon: Jake Gaile ORN, MD;  Location: [facility] OR;  Service: Vascular;  Laterality: Left;   AMPUTATION Right 08/20/2023   Procedure: AMPUTATION BELOW KNEE;  Surgeon: Jake Jerona GAILS, MD;  Location: [facility] OR;  Service: Orthopedics;  Laterality: Right;   APPLICATION OF WOUND VAC Right 08/20/2023   Procedure: APPLICATION, WOUND VAC;  Surgeon: Jake Jerona GAILS, MD;  Location: [facility] OR;  Service: Orthopedics;  Laterality: Right;   Social History   Socioeconomic History   Marital status: Single    Spouse name: Not on file   Number of children: Not on file   Years of education: Not on file   Highest education level: Not on file  Occupational History   Not on file  Tobacco Use   Smoking status: Every  Day    Current packs/day: 0.50    Types: Cigarettes   Smokeless tobacco: Never  Substance and Sexual Activity   Alcohol  use: Yes    Comment: 6 pack perday   Drug use: Yes    Types: Marijuana   Sexual activity: Not Currently  Other Topics Concern   Not on file  Social History Narrative   Not on file   Social Drivers of Health   Financial Resource Strain: Not on file  Food Insecurity: Food Insecurity Present (11/13/2023)   Hunger Vital Sign    Worried About Running Out of Food in the Last Year: Sometimes true    Ran Out of Food in the Last Year: Sometimes true  Transportation Needs: Unmet Transportation Needs (11/13/2023)   PRAPARE - Administrator, Civil Service (Medical): Yes    Lack of Transportation (Non-Medical): Yes  Physical Activity: Not on file  Stress: Not on file  Social Connections: Socially Isolated (07/20/2023)   Social Connection and Isolation Panel    Frequency of Communication with Friends and Family: Never    Frequency of Social Gatherings with Friends and Family: Never    Attends Religious Services: Never    Database administrator or Organizations: No    Attends Engineer, structural: Never    Marital Status: Never married   Family History  Family history unknown: Yes   - negative except otherwise stated in the family history section Allergies  Allergen Reactions   Pork-Derived Products Other (  See Comments)    Pt states he doesn't eat pork   Prior to Admission medications   Medication Sig Start Date End Date Taking? Authorizing Provider  acetaminophen  (TYLENOL ) 325 MG tablet Take 2 tablets (650 mg total) by mouth every 6 (six) hours as needed for mild pain (pain score 1-3) or fever (or Fever >/= 101). Patient not taking: Reported on 11/12/2023 11/06/23   Jake Sabas RAMAN, MD  amoxicillin -clavulanate (AUGMENTIN ) 875-125 MG tablet Take 1 tablet by mouth every 12 (twelve) hours. Patient not taking: Reported on 11/12/2023 11/06/23   Jake Sabas RAMAN,  MD  atorvastatin  (LIPITOR) 40 MG tablet Take 1 tablet (40 mg total) by mouth daily. Patient not taking: Reported on 11/12/2023 09/29/23   Jake Elsie BROCKS, MD  doxycycline  (VIBRA -TABS) 100 MG tablet Take 1 tablet (100 mg total) by mouth every 12 (twelve) hours. Patient not taking: Reported on 11/12/2023 11/06/23   Jake Sabas RAMAN, MD  folic acid  (FOLVITE ) 1 MG tablet Take 1 tablet (1 mg total) by mouth daily. Patient not taking: Reported on 11/12/2023 11/06/23   Jake Sabas RAMAN, MD  gabapentin  (NEURONTIN ) 100 MG capsule Take 1 capsule (100 mg total) by mouth 3 (three) times daily. Patient not taking: Reported on 11/13/2023 09/29/23   Jake Elsie BROCKS, MD  pantoprazole  (PROTONIX ) 40 MG tablet Take 1 tablet (40 mg total) by mouth daily. Patient not taking: Reported on 11/13/2023 09/30/23   Jake Elsie BROCKS, MD  thiamine  (VITAMIN B1) 100 MG tablet Take 1 tablet (100 mg total) by mouth daily. Patient not taking: Reported on 11/12/2023 11/06/23   Jake Sabas RAMAN, MD   CT FEMUR RIGHT W CONTRAST Result Date: 11/13/2023 CLINICAL DATA:  Osteomyelitis suspected, femur, xray done EXAM: CT OF THE LOWER RIGHT EXTREMITY WITH CONTRAST TECHNIQUE: Multidetector CT imaging of the lower right extremity was performed according to the standard protocol following intravenous contrast administration. RADIATION DOSE REDUCTION: This exam was performed according to the departmental dose-optimization program which includes automated exposure control, adjustment of the mA and/or kV according to patient size and/or use of iterative reconstruction technique. CONTRAST:  75mL OMNIPAQUE  IOHEXOL  350 MG/ML SOLN COMPARISON:  Radiographs 11/12/2023.  MRI 10/30/2023. FINDINGS: Bones/Joint/Cartilage Stable postsurgical changes from below the knee amputation. There is stable mild periosteal reaction along the amputation margin which appears unchanged. No cortical destruction identified to suggest osteomyelitis. No evidence of acute fracture, dislocation  or femoral head osteonecrosis. The bones are mildly demineralized. There is a small knee joint effusion. Ligaments Suboptimally assessed by CT. Muscles and Tendons Mild generalized muscular atrophy. No acute or focal abnormality identified. Soft tissues Mildly prominent inguinal lymph nodes bilaterally, likely reactive. Mild subcutaneous edema throughout the right thigh without new or enlarging focal fluid collection. A chronic well-circumscribed cystic lesion along the medial aspect of the distal right thigh is unchanged, measuring 2.3 cm on image 329/4. Mild subcutaneous inflammatory changes along the tibial stump without focal fluid collection, foreign body or soft tissue emphysema. IMPRESSION: 1. Stable postsurgical changes from below the knee amputation. No CT evidence of osteomyelitis. 2. Mild subcutaneous edema throughout the right thigh and along the tibial stump without new or enlarging focal fluid collection, foreign body or soft tissue emphysema. 3. Stable chronic well-circumscribed cystic lesion along the medial aspect of the distal right thigh, likely a sebaceous or ganglion cyst. 4. Mildly prominent inguinal lymph nodes bilaterally, likely reactive. Electronically Signed   By: Elsie Perone M.D.   On: 11/13/2023 11:29   ECHOCARDIOGRAM COMPLETE Result Date:  11/13/2023    ECHOCARDIOGRAM REPORT   Patient Name:   Jake Nguyen Date of Exam: 11/13/2023 Medical Rec #:  [id number]        Height:       70.0 in Accession #:    [id number]       Weight:       153.2 lb Date of Birth:  Jun 29, 1965         BSA:          1.864 m Patient Age:    58 years         BP:           117/64 mmHg Patient Gender: M                HR:           90 bpm. Exam Location:  Inpatient Procedure: 2D Echo, Color Doppler and Cardiac Doppler (Both Spectral and Color            Flow Doppler were utilized during procedure). Indications:    Shock  History:        Patient has prior history of Echocardiogram examinations, most                  recent 03/20/2021. Chronic alcohol  abuse.  Sonographer:    Benard Stallion Referring Phys: JESSICA MARSHALL IMPRESSIONS  1. Left ventricular ejection fraction, by estimation, is 65 to 70%. The left ventricle has normal function. Left ventricular endocardial border not optimally defined to evaluate regional wall motion. Left ventricular diastolic function could not be evaluated.  2. Right ventricular systolic function was not well visualized. The right ventricular size is not well visualized.  3. The mitral valve is grossly normal. No evidence of mitral valve regurgitation.  4. The aortic valve is grossly normal. Aortic valve regurgitation is not visualized. Conclusion(s)/Recommendation(s): Extremely limited study given that patient declined the majority of his exam. LV function appears roughly normal. FINDINGS  Left Ventricle: Left ventricular ejection fraction, by estimation, is 65 to 70%. The left ventricle has normal function. Left ventricular endocardial border not optimally defined to evaluate regional wall motion. The left ventricular internal cavity size was normal in size. There is no left ventricular hypertrophy. Left ventricular diastolic function could not be evaluated. Right Ventricle: The right ventricular size is not well visualized. Right vetricular wall thickness was not well visualized. Right ventricular systolic function was not well visualized. Left Atrium: Left atrial size was normal in size. Right Atrium: Right atrial size was not well visualized. Pericardium: The pericardium was not well visualized. Mitral Valve: The mitral valve is grossly normal. No evidence of mitral valve regurgitation. Tricuspid Valve: The tricuspid valve is not well visualized. Tricuspid valve regurgitation is trivial. No evidence of tricuspid stenosis. Aortic Valve: The aortic valve is grossly normal. Aortic valve regurgitation is not visualized. Pulmonic Valve: The pulmonic valve was not well visualized. No evidence  of pulmonic stenosis. Aorta: The aortic root is normal in size and structure. Venous: The inferior vena cava was not well visualized. IAS/Shunts: The interatrial septum was not assessed.  LEFT VENTRICLE PLAX 2D LVIDd:         4.70 cm LVIDs:         2.50 cm LV PW:         0.80 cm LV IVS:        0.80 cm  LEFT ATRIUM         Index LA diam:    3.40 cm  1.82 cm/m Morene Brownie Electronically signed by Morene Brownie Signature Date/Time: 11/13/2023/9:02:12 AM    Final    CT Head Wo Contrast Result Date: 11/12/2023 EXAM: CT HEAD WITHOUT 11/12/2023 10:25:54 PM TECHNIQUE: CT of the head was performed without the administration of intravenous contrast. Automated exposure control, iterative reconstruction, and/or weight based adjustment of the mA/kV was utilized to reduce the radiation dose to as low as reasonably achievable. COMPARISON: 10/27/2023 CLINICAL HISTORY: Head trauma, abnormal mental status (Age 17-64y). FINDINGS: BRAIN AND VENTRICLES: No acute intracranial hemorrhage. No mass effect or midline shift. No extra-axial fluid collection. Gray-white differentiation is maintained. No hydrocephalus. ORBITS: No acute abnormality. SINUSES AND MASTOIDS: No acute abnormality. SOFT TISSUES AND SKULL: No acute skull fracture. No acute soft tissue abnormality. IMPRESSION: 1. No acute intracranial abnormality. Electronically signed by: Franky Stanford MD 11/12/2023 10:32 PM EDT RP Workstation: [id number]   DG Tibia/Fibula Right Result Date: 11/12/2023 EXAM: 3 VIEW(S) XRAY OF THE RIGHT TIBIA AND FIBULA 11/12/2023 08:27:00 PM COMPARISON: Same day knee radiographs and MRI. CLINICAL HISTORY: Infection. FINDINGS: BONES AND JOINTS: No acute fracture or dislocation. No radiographic evidence of osteomyelitis; however, radiograph is less sensitive than MRI. SOFT TISSUES: Soft tissue swelling about the amputation stump. Right below the knee amputation. No knee joint effusion. IMPRESSION: 1. Right below the knee amputation with soft tissue  swelling about the amputation stump. 2. No radiographic evidence of osteomyelitis. Electronically signed by: Norman Gatlin MD 11/12/2023 08:49 PM EDT RP Workstation: [id number]   DG Chest Port 1 View Result Date: 11/12/2023 CLINICAL DATA:  Concern for sepsis. EXAM: PORTABLE CHEST 1 VIEW COMPARISON:  10/27/2023. FINDINGS: The heart size and mediastinal contours are within normal limits. Mild streaky bibasilar opacities are favored to reflect atelectasis, although, infiltrate cannot be excluded. No pleural effusion or pneumothorax. No acute osseous abnormality. IMPRESSION: Mild streaky bibasilar opacities are favored to reflect atelectasis, although, infiltrate cannot be excluded. Electronically Signed   By: Harrietta Sherry M.D.   On: 11/12/2023 20:05   DG Knee 2 Views Right Result Date: 11/12/2023 CLINICAL DATA:  Right knee wound. History of right below-the-knee amputation. EXAM: RIGHT KNEE - 1-2 VIEW COMPARISON:  10/27/2023 FINDINGS: The distal tibial stump/tibial and fibular transsection margins are not included within the field of view of this exam. No acute fracture or dislocation. No sizeable joint effusion. Generalized subcutaneous edema of the visualized right lower extremity. IMPRESSION: 1. No acute osseous abnormality identified. The distal tibial stump/tibial and fibular transsection margins are not included within the field of view of this exam. 2.  Generalized subcutaneous edema of the right lower extremity. Electronically Signed   By: Harrietta Sherry M.D.   On: 11/12/2023 20:02   - pertinent xrays, CT, MRI studies were reviewed and independently interpreted  Positive ROS: All other systems have been reviewed and were otherwise negative with the exception of those mentioned in the HPI and as above.  Physical Exam: General: Alert, no acute distress Psychiatric: Patient is competent for consent with normal mood and affect Lymphatic: No axillary or cervical lymphadenopathy Cardiovascular: No  pedal edema Respiratory: No cyanosis, no use of accessory musculature GI: No organomegaly, abdomen is soft and non-tender    Images:  @ENCIMAGES @  Labs:  Lab Results  Component Value Date   HGBA1C 5.6 07/20/2023   HGBA1C 6.6 (H) 07/31/2020   HGBA1C 6.9 (H) 05/23/2018   ESRSEDRATE 30 (H) 09/21/2023   ESRSEDRATE 29 (H) 09/04/2023   ESRSEDRATE 2 07/20/2023   CRP 2.1 (H) 09/21/2023  CRP 0.5 09/04/2023   CRP 10.6 (H) 08/23/2023   REPTSTATUS PENDING 11/12/2023   CULT  11/12/2023    NO GROWTH < 12 HOURS Performed at [facility], [address], [place], [place] [address]    LABORGA ACINETOBACTER CALCOACETICUS/BAUMANNII COMPLEX 05/20/2018    Lab Results  Component Value Date   ALBUMIN  2.5 (L) 11/13/2023   ALBUMIN  3.0 (L) 11/12/2023   ALBUMIN  2.7 (L) 10/29/2023        Latest Ref Rng & Units 11/13/2023    8:39 AM 11/12/2023    7:23 PM 11/04/2023    4:16 PM  CBC EXTENDED  WBC 4.0 - 10.5 K/uL 9.1  10.9  5.4   RBC 4.22 - 5.81 MIL/uL 3.72  4.66  4.04   Hemoglobin 13.0 - 17.0 g/dL 89.5  86.7  88.6   HCT 39.0 - 52.0 % 31.8  40.0  36.3   Platelets 150 - 400 K/uL 428  538  309   NEUT# 1.7 - 7.7 K/uL  5.0  1.1   Lymph# 0.7 - 4.0 K/uL  4.0  2.8     Neurologic: Patient does not have protective sensation bilateral lower extremities.   MUSCULOSKELETAL:   Skin: Examination patient has ischemic changes to the right transtibial amputation.  This is tender to palpation.  There is clear drainage without open ulcer.  There is superficial ischemic black eschar.  The left above-knee amputation is stable without cellulitis.  Hemoglobin 10.4 with a white cell count of 9.1.  Hemoglobin A1c 5.6.  Most recent sed rate 30 and most recent C-reactive protein 2.1.  Assessment: Assessment: Ischemic changes right transtibial amputation.  Plan: Would continue IV antibiotics and Vashe dressing changes with dry 4 x 4 gauze to the right transtibial amputation.  Discussed with patient if the  pain does not improve and the leg is still symptomatic we could proceed with an above-the-knee amputation on the right.  Would plan for surgery depending on how patient is symptomatically.  Thank you for the consult and the opportunity to see Mr. Jake Hodapp, MD [facility] [phone] 5:51 PM

## 2023-11-13 NOTE — Progress Notes (Signed)
 eLink Physician-Brief Progress Note Patient Name: Jake Nguyen DOB: October 01, 1965 MRN: [id number]   Date of Service  11/13/2023  HPI/Events of Note  58 yr old male now in ICU for  Septic shock from RLE infected wound.  ETOH abuse, electrolyte imbalance.  Camera: Sacral dermatitis, excoriations. Left AKA status. Able to protect his airways. Awake. Alert Right BKA amputation stump infection. MAP 68, sinus tachy. Sats ok.  Data reviewed K 3.4, cr normal. LFT normal wbc 10.9K N 45% Lactic acid  slowly trending down still at 3.1 UA neg Tox + benzos RVP neg CTH neg X ray rt sump-no osteo signs CxR: images seen: clear EKG reviewed, artifacts. Sinus tachy, no acute changes   eICU Interventions  Code spsis protocol Aspiration precautions CBG goals < 180 On vanc/zosyn  Wound care Watch for alcohol  withdrawals, electrolyte replacement and follow ups. NPO ECHO On PPI . Not on VTE yet, might go for debridement in AM      Intervention Category Major Interventions: Sepsis - evaluation and management Evaluation Type: New Patient Evaluation  Jodelle ONEIDA Hutching 11/13/2023, 4:36 AM

## 2023-11-13 NOTE — Plan of Care (Signed)
 Severe sepsis not responding to IV fluid.  Received total 5 L of LR bolus, 25 g of albumin  and midodrine  10 mg.  Consulted PCCM and spoke with Dr. Layman will evaluate patient soon.

## 2023-11-13 NOTE — Plan of Care (Addendum)
 Patient is drowsy/sleepy.  UDS came back positive with benzodiazepine.  It is possible he is taking from the unknown source because it is not listed on home  medication list. In the ED patient remained persistently hypotensive lowest MAP 66 blood pressure 77/61 and persistently tachycardic heart rate 137.  Giving fourth bolus of LR, also giving albumin  25 g, midodrine  10 mg and continue maintenance fluid LR 150 cc/h. After giving the fourth liter of LR bolus, albumin  and midodrine  if patient remained still hypotensive MAP below 65 will consult PCCM for evaluation.

## 2023-11-13 NOTE — Progress Notes (Signed)
 Pt transferred to 2W 26 via bed. Vitals per flowsheet.

## 2023-11-13 NOTE — Consult Note (Addendum)
 WOC Nurse Consult Note: patient with history of L AKA 2022 and R BKA 08/20/2023 by Dr. Harden; recent admission for cellulitis R stump  Reason for Consult: B stump wounds  Wound type: 1.  Full thickness R stump red moist  2.  Stage 3 Pressure Injuries B lower buttocks/ischium appear 50% red 50% tan  Pressure Injury POA: Yes Measurement: see nursing flowsheet  Wound bed: as above  Drainage (amount, consistency, odor) foul smelling to R stump per MD notes  Periwound: dry scaly skin  Dressing procedure/placement/frequency:  Cleanse B ischial/buttocks wounds with Vashe wound cleanser Soila [phone]) do not rinse and allow to air dry. Apply Medihoney to wound beds daily, cover with dry gauze and secure with silicone foam.  Cleanse R stump wounds with Vashe, do not rinse and allow to air dry. Apply Xeroform gauze (Lawson [phone]) to wound beds daily and secure with silicone foam or dry gauze and Kerlix roll gauze whichever is preferred.   Will write for Gerhardts Butt Cream to B buttocks 2 times daily and prn soiling.    POC discussed with bedside nurse.  WOC team will not follow. Re-consult if further needs arise. Patient should continue to follow with Dr. Harden for ongoing management of stump.    Thank you,    Powell Bar MSN, RN-BC, [employer]

## 2023-11-14 DIAGNOSIS — R6520 Severe sepsis without septic shock: Secondary | ICD-10-CM | POA: Diagnosis not present

## 2023-11-14 DIAGNOSIS — A419 Sepsis, unspecified organism: Secondary | ICD-10-CM | POA: Diagnosis not present

## 2023-11-14 DIAGNOSIS — G9341 Metabolic encephalopathy: Secondary | ICD-10-CM | POA: Diagnosis not present

## 2023-11-14 LAB — BASIC METABOLIC PANEL WITH GFR
Anion gap: 8 (ref 5–15)
BUN: <5 mg/dL — ABNORMAL LOW (ref 6–20)
CO2: 24 mmol/L (ref 22–32)
Calcium: 8 mg/dL — ABNORMAL LOW (ref 8.9–10.3)
Chloride: 100 mmol/L (ref 98–111)
Creatinine, Ser: 0.54 mg/dL — ABNORMAL LOW (ref 0.61–1.24)
GFR, Estimated: >60 mL/min (ref >60)
Glucose, Bld: 98 mg/dL (ref 70–99)
Potassium: 3.8 mmol/L (ref 3.5–5.1)
Sodium: 132 mmol/L — ABNORMAL LOW (ref 135–145)

## 2023-11-14 LAB — MAGNESIUM: Magnesium: 1.8 mg/dL (ref 1.7–2.4)

## 2023-11-14 LAB — PHOSPHORUS: Phosphorus: 5 mg/dL — ABNORMAL HIGH (ref 2.5–4.6)

## 2023-11-14 MED ORDER — PIPERACILLIN-TAZOBACTAM 3.375 G IVPB
3.3750 g | Freq: Three times a day (TID) | INTRAVENOUS | Status: DC
  Administered 2023-11-14 – 2023-11-18 (×11): 3.375 g via INTRAVENOUS
  Filled 2023-11-14 (×11): qty 50

## 2023-11-14 MED ORDER — VANCOMYCIN HCL 1000 MG IV SOLR
1000.0000 mg | Freq: Two times a day (BID) | INTRAVENOUS | Status: DC
  Administered 2023-11-14 – 2023-11-16 (×4): 1000 mg via INTRAVENOUS
  Filled 2023-11-14 (×5): qty 20

## 2023-11-14 MED ORDER — PANTOPRAZOLE SODIUM 40 MG PO TBEC
40.0000 mg | DELAYED_RELEASE_TABLET | Freq: Every day | ORAL | Status: DC
  Administered 2023-11-14 – 2023-11-22 (×12): 40 mg via ORAL
  Filled 2023-11-14 (×9): qty 1

## 2023-11-14 MED ORDER — GABAPENTIN 100 MG PO CAPS
100.0000 mg | ORAL_CAPSULE | Freq: Three times a day (TID) | ORAL | Status: DC
  Administered 2023-11-14 – 2023-11-22 (×32): 100 mg via ORAL
  Filled 2023-11-14 (×25): qty 1

## 2023-11-14 NOTE — Plan of Care (Signed)

## 2023-11-14 NOTE — Hospital Course (Addendum)
 58 year old unfortunate gentleman w/ multiple medical history w/ right BKA 08/2023, left BKA 2022, chronic alcohol  use disorder, anemia of chronic disease, chronic thrombocytopenia, history of recurrent hyponatremia secondary to chronic alcohol  use, anxiety, and depression and homelessness brought to the ED via EMS [place] bus [place] an[place] EMS found patient was hypotensive, tachycardic. 7/18 to 7/28 right BKA with osteomyelitis and recent hospital admission for stump infection on 7/28 treated with IV vancomycin  and ceftriaxone -orthopedic Dr. Harden was consulted recommended continue IV antibiotic to complete 7 days followed by continue oral antibiotics for 7 days patient was discharged home with Augmentin  and doxycycline  Patient is stating that he has not been compliant with the antibiotic he has been prescribed and discharged with Patient was admitted for  sepsis secondary to right BKA stump infection. X-ray ruled out osteomyelitis. In the ED UDS positive with benzo, although benzos not listed in the meds, patient remained persistently hypertensive and was transferred to ICU 8/4.  CT head negative UA negative RVP negative.  Chest x-ray clear EKG sinus tachycardia Patient transferred to TRH 8/5> managed with dressing changes, IV antibiotics subsequently changed to p.o. At this time waiting for SNF placement Patient at this time refused to go to SNF and wants to return to [facility] Discussed with Dr. Harden okay to discharge home on wound care supplies and instruction and oral antibiotics for few more days He will follow-up with Dr. Harden office and PCP   Subjective: Seen and examined Aaox3 no new complaints Overnight afebrile BP 90s-100 asymptomatic.  Labs reviewed remains unchanged 8/12   Discharge diagnosis:  Hypotension-multifactorial Initially suspected septic shock-no obvious source Right transtibial amputation with ischemic changes: Initially managed in ICU, vitals stabilized and  transferred to TR[facility] service Work up unyielding with:UA and chest x-ray with no evidence of infection.X-ray stump- no osteomyelitis.Blood culture 8/3 NGTD Dr. Harden input appreciated- felt to be ischemic changes on right transtibial amputation-advised antibiotics, dressing changes Ischemic changes are improving, no surgery indicated  AND okay for SNF placement once available IV antibiotics were changed to p.o. 8/9 complete 1 week course-only few days remaining Weaning off on midodrine > changed to 2.5 mg tid 8/12-will continue on that for now  As per Dr. Harden okay to discharge as patient does not go to SNF and he will return to [facility] with wound care supplies which has been provided  Left AKA status: Old.stable.  Hypokalemia: Resolved.  Hypotension History of hypertension: Stable continue midodrine   Hyperlipidemia: Continue statin.  Substance abuse Alcohol  abuse Anxiety/depression Homelessness: Continue supportive care.No signs of withdrawal.  Continue multivitamin thiamine . TOC consult for resources.  He is homeless and disposition will be difficult  Mobility: PT Orders: Active PT Follow up Rec: Long-Term Institutional Care Without Follow-Up Therapy8/02/2024 1600    DVT prophylaxis: rivaroxaban  (XARELTO ) tablet 10 mg Start: 11/18/23 1345 Code Status:   Code Status: Full Code Family Communication: plan of care discussed with patien at bedside. Patient status is: Remains hospitalized because of severity of illness Level of care: Telemetry Medical   Dispo: The patient is from: homeless            Anticipated disposition: IRC Objective: Vitals last 24 hrs: Vitals:   11/22/23 0024 11/22/23 0432 11/22/23 0436 11/22/23 0813  BP: 98/72  97/60 106/71  Pulse: 92  84 89  Resp: 18  18 17   Temp: 98.5 F (36.9 C)  98.4 F (36.9 C) 98.2 F (36.8 C)  TempSrc:      SpO2: 95%  96% 100%  Weight:  66.8 kg    Height:        Physical Examination: General exam: AAOX3, pleasant   HEENT:Oral mucosa moist, Ear/Nose WNL grossly Respiratory system: B/L Clear BS,no use of accessory muscle Cardiovascular system: S1 & S2 +, No JVD. Gastrointestinal system: Abdomen soft,NT,ND, BS+ Nervous System: Alert awake nonfocal Extremities: Old left AKA stump, Rt BKA-dressing in place  c/d/I- Skin: No rashes,no icterus. MSK: Normal muscle bulk,tone, power   Medications reviewed:  Scheduled Meds:  amoxicillin -clavulanate  1 tablet Oral Q12H   atorvastatin   40 mg Oral Daily   Chlorhexidine  Gluconate Cloth  6 each Topical Daily   doxycycline   100 mg Oral Q12H   folic acid   1 mg Oral Daily   gabapentin   100 mg Oral TID   Gerhardt's butt cream   Topical BID   leptospermum manuka honey  1 Application Topical Daily   melatonin  5 mg Oral QHS   midodrine   2.5 mg Oral TID WC   multivitamin with minerals  1 tablet Oral Daily   pantoprazole   40 mg Oral Daily   rivaroxaban   10 mg Oral Daily   sodium chloride  flush  3 mL Intravenous Q12H   sodium chloride  flush  3 mL Intravenous Q12H   thiamine   100 mg Oral Daily   Continuous Infusions:   Diet: Diet Order             Diet general           Diet regular Room service appropriate? Yes; Fluid consistency: Thin  Diet effective now

## 2023-11-14 NOTE — Progress Notes (Signed)
 VAT:: Consult for PIV  RA PIV flushed, patent and dressing changed.  Consult complete.

## 2023-11-14 NOTE — TOC Progression Note (Signed)
 Transition of Care [facility]) - Progression Note    Patient Details  Name: Jake Nguyen MRN: [id number] Date of Birth: [birth year]  [facility] CM/SW Contact  [name], RN Phone Number: 11/14/2023, 4:00 PM  Clinical Narrative:    Patient was seen by Dr. Harden regarding ischemic changes/ pain to right transtibial amputation.  Dr. Duda will continue to follow the patient for possible need for amputation if symptoms do not improve.  CM and MSW with IP care management team will continue to follow the patient as he progresses.  Patient is homeless and requires [facility] for mobilization.  Patient with history of staying in homeless shelter.                  Expected Discharge Plan and Services                                               Social Drivers of Health (SDOH) Interventions SDOH Screenings   Food Insecurity: Food Insecurity Present (11/13/2023)  Housing: High Risk (11/13/2023)  Transportation Needs: Unmet Transportation Needs (11/13/2023)  Utilities: Patient Declined (11/13/2023)  Alcohol  Screen: Medium Risk (11/25/2020)  Depression (PHQ2-9): Low Risk  (11/06/2019)  Social Connections: Socially Isolated (07/20/2023)  Tobacco Use: High Risk (11/12/2023)    Readmission Risk Interventions     No data to display

## 2023-11-14 NOTE — Progress Notes (Signed)
 PROGRESS NOTE Jake Nguyen  FMW:[id number] DOB: 1965-09-28 DOA: 11/12/2023 PCP: Jake Nguyen, No  Brief Narrative/Hospital Course: 58 year old unfortunate gentleman w/ multiple medical history w/ right BKA 08/2023, left BKA 2022, chronic alcohol  use disorder, anemia of chronic disease, chronic thrombocytopenia, history of recurrent hyponatremia secondary to chronic alcohol  use, anxiety, and depression and homelessness brought to the ED via EMS [place] EMS found patient was hypotensive, tachycardic. 7/18 to 7/28 right BKA with osteomyelitis and recent hospital admission for stump infection on 7/28 treated with IV vancomycin  and ceftriaxone -orthopedic Dr. Harden was consulted recommended continue IV antibiotic to complete 7 days followed by continue oral antibiotics for 7 days patient was discharged home with Augmentin  and doxycycline  Patient is stating that he has not been compliant with the antibiotic he has been prescribed and discharged with Patient was admitted for  sepsis secondary to right BKA stump infection. X-ray ruled out osteomyelitis. In the ED UDS positive with benzo, although benzos not listed in the meds, patient remained persistently hypertensive and was transferred to ICU 8/4.  CT head negative UA negative RVP negative.  Chest x-ray clear EKG sinus tachycardia Patient transferred to TRH 8/5  Subjective: Seen and examined today He is cleaning himself, he is asking for food and somewhat upset as he is n.p.o. Overnight BP 90s-106, afebrile heart rate 90s-100, on room air Labs stable electrolytes glucose 98  Assessment and plan:  Septic shock from right lower extremity infected wound Right transtibial amputation with ischemic changes: Hemodynamically stabilized> transferred to [facility] service.  UA and chest x-ray with no evidence of infection.  X-ray stump- no ostea Dr. Harden following advised antibiotics, VAC dressing changes with dry 4 x 4 gauze to the right transtibial  amputation, if he does not improve and leg is still symptomatic we will likely proceed with AKA on the right. Continue wound care, possible debridement. Blood cultures from admission NGTD Continue vancomycin  IV, Zosyn , pharmacy dosing.  On midodrine  10mg  tid Discussed with Dr. Harden okay for diet this morning  Left AKA status: Old.  Hypokalemia: Resolved.  Hypertension: BP stable  Hyperlipidemia: Continue statin.  Substance abuse Alcohol  abuse Anxiety/depression Homelessness: Continue supportive care, CIWA Ativan  thiamine  folate.  TOC consult to help with resources.  High risk of readmission and decompensation  Mobility: OT eval  DVT prophylaxis:  Code Status:   Code Status: Full Code Family Communication: plan of care discussed with patien at bedside. Patient status is: Remains hospitalized because of severity of illness Level of care: Telemetry Medical   Dispo: The patient is from: homeless            Anticipated disposition: TBD Objective: Vitals last 24 hrs: Vitals:   11/13/23 2220 11/14/23 0013 11/14/23 0426 11/14/23 0759  BP: 105/65 (!) 97/54 106/60 97/65  Pulse: 90 95 96 90  Resp: 16 18 17 18   Temp: 98.4 F (36.9 C) 98.4 F (36.9 C) 99.1 F (37.3 C) 98.6 F (37 C)  TempSrc: Tympanic Tympanic  Oral  SpO2: 95% 96% 94% 94%  Weight: 69 kg     Height: 5' 10 (1.778 m)       Physical Examination: General exam: alert awake, oriented, older than stated age HEENT:Oral mucosa moist, Ear/Nose WNL grossly Respiratory system: Bilaterally clear BS,no use of accessory muscle Cardiovascular system: S1 & S2 +, No JVD. Gastrointestinal system: Abdomen soft,NT,ND, BS+ Nervous System: Alert, awake, moving all extremities,and following commands. Extremities: Lt aka, rt foot stump with dressing in place  Skin: No rashes,no icterus. MSK: Normal muscle bulk,tone, power   Medications reviewed:  Scheduled Meds:  atorvastatin   40 mg Oral Daily   Chlorhexidine  Gluconate  Cloth  6 each Topical Daily   folic acid   1 mg Oral Daily   gabapentin   100 mg Oral TID   Gerhardt's butt cream   Topical BID   leptospermum manuka honey  1 Application Topical Daily   melatonin  5 mg Oral QHS   midodrine   10 mg Oral TID WC   multivitamin with minerals  1 tablet Oral Daily   mupirocin  ointment  1 Application Nasal BID   mouth rinse  15 mL Mouth Rinse 4 times per day   pantoprazole   40 mg Oral Daily   sodium chloride  flush  3 mL Intravenous Q12H   sodium chloride  flush  3 mL Intravenous Q12H   thiamine   100 mg Oral Daily   Continuous Infusions:  piperacillin -tazobactam 3.375 g (11/14/23 0329)   vancomycin      Diet: Diet Order             Diet regular Room service appropriate? Yes; Fluid consistency: Thin  Diet effective now                    Data Reviewed: I have personally reviewed following labs and imaging studies ( see epic result tab) CBC: Recent Labs  Lab 11/12/23 1923 11/13/23 0839  WBC 10.9* 9.1  NEUTROABS 5.0  --   HGB 13.2 10.4*  HCT 40.0 31.8*  MCV 85.8 85.5  PLT 538* 428*   CMP: Recent Labs  Lab 11/12/23 1923 11/13/23 0839 11/14/23 0437  NA 135 136 132*  K 3.4* 3.7 3.8  CL 96* 98 100  CO2 27 27 24   GLUCOSE 103* 123* 98  BUN <5* <5* <5*  CREATININE 0.61 0.52* 0.54*  CALCIUM  8.3* 8.2* 8.0*  MG 2.0 1.5* 1.8  PHOS  --   --  5.0*   GFR: Estimated Creatinine Clearance: 100.6 mL/min (A) (by C-G formula based on SCr of 0.54 mg/dL (L)). Recent Labs  Lab 11/12/23 1923 11/13/23 0839  AST 24 20  ALT 16 13  ALKPHOS 68 55  BILITOT 0.4 0.9  PROT 8.0 5.8*  ALBUMIN  3.0* 2.5*   No results for input(s): LIPASE, AMYLASE in the last 168 hours. No results for input(s): AMMONIA in the last 168 hours. Coagulation Profile:  Recent Labs  Lab 11/12/23 1923  INR 1.1   Unresulted Labs (From admission, onward)     Start     Ordered   11/15/23 0500  CBC  Daily,   R     Question:  Specimen collection method  Answer:  Lab=Lab collect    11/14/23 9167   11/14/23 0500  Phosphorus  Daily,   R     Question:  Specimen collection method  Answer:  Lab=Lab collect   11/13/23 1156   11/14/23 0500  Basic metabolic panel  Daily,   R     Question:  Specimen collection method  Answer:  Lab=Lab collect   11/13/23 1156   11/14/23 0500  Magnesium   Daily,   R     Question:  Specimen collection method  Answer:  Lab=Lab collect   11/13/23 1156   11/12/23 2313  Expectorated Sputum Assessment w Gram Stain, Rflx to Resp Cult  Once,   R       Question Answer Comment  Patient immune status Normal   Release to patient Immediate  11/12/23 2312   11/12/23 2313  Legionella Pneumophila Serogp 1 Ur Ag  Once,   R        11/12/23 2312           Antimicrobials/Microbiology: Anti-infectives (From admission, onward)    Start     Dose/Rate Route Frequency Ordered Stop   11/14/23 1100  vancomycin  (VANCOCIN ) 1,000 mg in sodium chloride  0.9 % 250 mL IVPB        1,000 mg 250 mL/hr over 60 Minutes Intravenous Every 12 hours 11/14/23 1040     11/13/23 1000  vancomycin  (VANCOCIN ) IVPB 1000 mg/200 mL premix  Status:  Discontinued        1,000 mg 200 mL/hr over 60 Minutes Intravenous Every 12 hours 11/12/23 2047 11/14/23 1040   11/13/23 0400  piperacillin -tazobactam (ZOSYN ) IVPB 3.375 g       Placed in Followed by Linked Group   3.375 g 12.5 mL/hr over 240 Minutes Intravenous Every 8 hours 11/12/23 1950     11/12/23 2000  piperacillin -tazobactam (ZOSYN ) IVPB 3.375 g       Placed in Followed by Linked Group   3.375 g 100 mL/hr over 30 Minutes Intravenous  Once 11/12/23 1950 11/12/23 2130   11/12/23 2000  vancomycin  (VANCOREADY) IVPB 1500 mg/300 mL        1,500 mg 150 mL/hr over 120 Minutes Intravenous  Once 11/12/23 1950 11/12/23 2350         Component Value Date/Time   SDES BLOOD LEFT ANTECUBITAL 11/12/2023 2114   SPECREQUEST  11/12/2023 2114    BOTTLES DRAWN AEROBIC AND ANAEROBIC Blood Culture adequate volume   CULT  11/12/2023  2114    NO GROWTH 2 DAYS Performed at [facility], [address], [place], [place] [address]    REPTSTATUS PENDING 11/12/2023 2114    Procedures:  Mennie LAMY, MD [facility] 11/14/2023, 11:36 AM

## 2023-11-15 DIAGNOSIS — R6520 Severe sepsis without septic shock: Secondary | ICD-10-CM | POA: Diagnosis not present

## 2023-11-15 DIAGNOSIS — A419 Sepsis, unspecified organism: Secondary | ICD-10-CM | POA: Diagnosis not present

## 2023-11-15 DIAGNOSIS — G9341 Metabolic encephalopathy: Secondary | ICD-10-CM | POA: Diagnosis not present

## 2023-11-15 LAB — BASIC METABOLIC PANEL WITH GFR
Anion gap: 9 (ref 5–15)
BUN: <5 mg/dL — ABNORMAL LOW (ref 6–20)
CO2: 24 mmol/L (ref 22–32)
Calcium: 8.4 mg/dL — ABNORMAL LOW (ref 8.9–10.3)
Chloride: 102 mmol/L (ref 98–111)
Creatinine, Ser: 0.55 mg/dL — ABNORMAL LOW (ref 0.61–1.24)
GFR, Estimated: >60 mL/min (ref >60)
Glucose, Bld: 105 mg/dL — ABNORMAL HIGH (ref 70–99)
Potassium: 3.7 mmol/L (ref 3.5–5.1)
Sodium: 135 mmol/L (ref 135–145)

## 2023-11-15 LAB — PHOSPHORUS: Phosphorus: 5.6 mg/dL — ABNORMAL HIGH (ref 2.5–4.6)

## 2023-11-15 LAB — VANCOMYCIN, PEAK: Vancomycin Pk: 26 ug/mL — ABNORMAL LOW (ref 30–40)

## 2023-11-15 LAB — LEGIONELLA PNEUMOPHILA SEROGP 1 UR AG: L. pneumophila Serogp 1 Ur Ag: NEGATIVE

## 2023-11-15 LAB — MAGNESIUM: Magnesium: 1.8 mg/dL (ref 1.7–2.4)

## 2023-11-15 LAB — CBC
HCT: 33.5 % — ABNORMAL LOW (ref 39.0–52.0)
Hemoglobin: 10.8 g/dL — ABNORMAL LOW (ref 13.0–17.0)
MCH: 27.9 pg (ref 26.0–34.0)
MCHC: 32.2 g/dL (ref 30.0–36.0)
MCV: 86.6 fL (ref 80.0–100.0)
Platelets: 409 K/uL — ABNORMAL HIGH (ref 150–400)
RBC: 3.87 MIL/uL — ABNORMAL LOW (ref 4.22–5.81)
RDW: 14.5 % (ref 11.5–15.5)
WBC: 7.3 K/uL (ref 4.0–10.5)
nRBC: 0 % (ref 0.0–0.2)

## 2023-11-15 NOTE — Plan of Care (Signed)

## 2023-11-15 NOTE — Progress Notes (Signed)
 PROGRESS NOTE Jake Nguyen  FMW:[id number] DOB: 11-Jun-1965 DOA: 11/12/2023 PCP: Freddrick, No  Brief Narrative/Hospital Course: 58 year old unfortunate gentleman w/ multiple medical history w/ right BKA 08/2023, left BKA 2022, chronic alcohol  use disorder, anemia of chronic disease, chronic thrombocytopenia, history of recurrent hyponatremia secondary to chronic alcohol  use, anxiety, and depression and homelessness brought to the ED via EMS [place] found patient was hypotensive, tachycardic. 7/18 to 7/28 right BKA with osteomyelitis and recent hospital admission for stump infection on 7/28 treated with IV vancomycin  and ceftriaxone -orthopedic Dr. Harden was consulted recommended continue IV antibiotic to complete 7 days followed by continue oral antibiotics for 7 days patient was discharged home with Augmentin  and doxycycline  Patient is stating that he has not been compliant with the antibiotic he has been prescribed and discharged with Patient was admitted for  sepsis secondary to right BKA stump infection. X-ray ruled out osteomyelitis. In the ED UDS positive with benzo, although benzos not listed in the meds, patient remained persistently hypertensive and was transferred to ICU 8/4.  CT head negative UA negative RVP negative.  Chest x-ray clear EKG sinus tachycardia Patient transferred to TRH 8/5  Subjective: Seen and examined this morning He is having mea, reports his dressing on the foot [facility] during early morning and has no new complaints pain is controlled BP stable afebrile, labs reviewed this morning overall unchanged  Assessment and plan:  Septic shock from right lower extremity infected wound Right transtibial amputation with ischemic changes: Hemodynamically stabilized> transferred back to TRH service by ICU. UA and chest x-ray with no evidence of infection.  X-ray stump- no ostea Dr. Harden following advised antibiotics, dressing changes with dry 4 x 4  gauze to the right transtibial amputation, if he does not improve and leg is still symptomatic we will likely proceed with AKA on the right in near future. Blood cultures from admission NGTD Continue vancomycin  IV, Zosyn , pharmacy dosing.  On midodrine  10mg  tid  Left AKA status: Old.  Hypokalemia: Resolved.  Hypertension: BP stable.  On midodrine  for low blood pressure  Hyperlipidemia: Continue statin.  Substance abuse Alcohol  abuse Anxiety/depression Homelessness: Continue supportive care, CIWA Ativan  thiamine  folate.  TOC consult to help with resources.  High risk of readmission and decompensation  Mobility: OT eval  DVT prophylaxis:  Code Status:   Code Status: Full Code Family Communication: plan of care discussed with patien at bedside. Patient status is: Remains hospitalized because of severity of illness Level of care: Telemetry Medical   Dispo: The patient is from: homeless            Anticipated disposition: TBD Objective: Vitals last 24 hrs: Vitals:   11/15/23 0130 11/15/23 0422 11/15/23 0422 11/15/23 0745  BP: 98/66 106/62 106/62 102/74  Pulse: 98 (!) 102 96 78  Resp:    18  Temp: 98.5 F (36.9 C) 98.5 F (36.9 C) 98.5 F (36.9 C) 98.6 F (37 C)  TempSrc:      SpO2: 97% 95% 95% 97%  Weight:      Height:        Physical Examination: General exam: aaox3 HEENT:Oral mucosa moist, Ear/Nose WNL grossly Respiratory system: Bilaterally clear BS,no use of accessory muscle Cardiovascular system: S1 & S2 +, No JVD. Gastrointestinal system: Abdomen soft,NT,ND, BS+ Nervous System: Alert, awake, moving all extremities,and following commands. Extremities: Lt AKA OLD RT Foot stump with dressing in place Skin: No rashes,no icterus. MSK: Normal muscle bulk,tone, power  Medications reviewed:  Scheduled Meds:  atorvastatin   40 mg Oral Daily   Chlorhexidine  Gluconate Cloth  6 each Topical Daily   folic acid   1 mg Oral Daily   gabapentin   100 mg Oral TID    Gerhardt's butt cream   Topical BID   leptospermum manuka honey  1 Application Topical Daily   melatonin  5 mg Oral QHS   midodrine   10 mg Oral TID WC   multivitamin with minerals  1 tablet Oral Daily   mupirocin  ointment  1 Application Nasal BID   mouth rinse  15 mL Mouth Rinse 4 times per day   pantoprazole   40 mg Oral Daily   sodium chloride  flush  3 mL Intravenous Q12H   sodium chloride  flush  3 mL Intravenous Q12H   thiamine   100 mg Oral Daily   Continuous Infusions:  piperacillin -tazobactam 3.375 g (11/15/23 0614)   vancomycin  1,000 mg (11/15/23 0050)   Diet: Diet Order             Diet regular Room service appropriate? Yes; Fluid consistency: Thin  Diet effective now                    Data Reviewed: I have personally reviewed following labs and imaging studies ( see epic result tab) CBC: Recent Labs  Lab 11/12/23 1923 11/13/23 0839 11/15/23 0440  WBC 10.9* 9.1 7.3  NEUTROABS 5.0  --   --   HGB 13.2 10.4* 10.8*  HCT 40.0 31.8* 33.5*  MCV 85.8 85.5 86.6  PLT 538* 428* 409*   CMP: Recent Labs  Lab 11/12/23 1923 11/13/23 0839 11/14/23 0437 11/15/23 0440  NA 135 136 132* 135  K 3.4* 3.7 3.8 3.7  CL 96* 98 100 102  CO2 27 27 24 24   GLUCOSE 103* 123* 98 105*  BUN <5* <5* <5* <5*  CREATININE 0.61 0.52* 0.54* 0.55*  CALCIUM  8.3* 8.2* 8.0* 8.4*  MG 2.0 1.5* 1.8 1.8  PHOS  --   --  5.0* 5.6*   GFR: Estimated Creatinine Clearance: 100.6 mL/min (A) (by C-G formula based on SCr of 0.55 mg/dL (L)). Recent Labs  Lab 11/12/23 1923 11/13/23 0839  AST 24 20  ALT 16 13  ALKPHOS 68 55  BILITOT 0.4 0.9  PROT 8.0 5.8*  ALBUMIN  3.0* 2.5*   No results for input(s): LIPASE, AMYLASE in the last 168 hours. No results for input(s): AMMONIA in the last 168 hours. Coagulation Profile:  Recent Labs  Lab 11/12/23 1923  INR 1.1   Unresulted Labs (From admission, onward)     Start     Ordered   11/15/23 0500  CBC  Daily,   R     Question:  Specimen  collection method  Answer:  Lab=Lab collect   11/14/23 9167   11/14/23 0500  Phosphorus  Daily,   R     Question:  Specimen collection method  Answer:  Lab=Lab collect   11/13/23 1156   11/14/23 0500  Basic metabolic panel  Daily,   R     Question:  Specimen collection method  Answer:  Lab=Lab collect   11/13/23 1156   11/14/23 0500  Magnesium   Daily,   R     Question:  Specimen collection method  Answer:  Lab=Lab collect   11/13/23 1156   11/12/23 2313  Expectorated Sputum Assessment w Gram Stain, Rflx to Resp Cult  Once,   R       Question Answer  Comment  Patient immune status Normal   Release to patient Immediate      11/12/23 2312   11/12/23 2313  Legionella Pneumophila Serogp 1 Ur Ag  Once,   R        11/12/23 2312           Antimicrobials/Microbiology: Anti-infectives (From admission, onward)    Start     Dose/Rate Route Frequency Ordered Stop   11/14/23 2100  piperacillin -tazobactam (ZOSYN ) IVPB 3.375 g       Placed in Followed by Linked Group   3.375 g 12.5 mL/hr over 240 Minutes Intravenous Every 8 hours 11/14/23 2037     11/14/23 1100  vancomycin  (VANCOCIN ) 1,000 mg in sodium chloride  0.9 % 250 mL IVPB        1,000 mg 250 mL/hr over 60 Minutes Intravenous Every 12 hours 11/14/23 1040     11/13/23 1000  vancomycin  (VANCOCIN ) IVPB 1000 mg/200 mL premix  Status:  Discontinued        1,000 mg 200 mL/hr over 60 Minutes Intravenous Every 12 hours 11/12/23 2047 11/14/23 1040   11/13/23 0400  piperacillin -tazobactam (ZOSYN ) IVPB 3.375 g  Status:  Discontinued       Placed in Followed by Linked Group   3.375 g 12.5 mL/hr over 240 Minutes Intravenous Every 8 hours 11/12/23 1950 11/14/23 2037   11/12/23 2000  piperacillin -tazobactam (ZOSYN ) IVPB 3.375 g       Placed in Followed by Linked Group   3.375 g 100 mL/hr over 30 Minutes Intravenous  Once 11/12/23 1950 11/12/23 2130   11/12/23 2000  vancomycin  (VANCOREADY) IVPB 1500 mg/300 mL        1,500 mg 150 mL/hr  over 120 Minutes Intravenous  Once 11/12/23 1950 11/12/23 2350         Component Value Date/Time   SDES BLOOD LEFT ANTECUBITAL 11/12/2023 2114   SPECREQUEST  11/12/2023 2114    BOTTLES DRAWN AEROBIC AND ANAEROBIC Blood Culture adequate volume   CULT  11/12/2023 2114    NO GROWTH 3 DAYS Performed at [facility], [address] [place], [place] [address]    REPTSTATUS PENDING 11/12/2023 2114    Procedures:  Mennie LAMY, MD [facility] 11/15/2023, 10:03 AM

## 2023-11-15 NOTE — Progress Notes (Signed)
 Transition of Care [facility]) - Inpatient Brief Assessment   Patient Details  Name: Jake Nguyen MRN: [id number] Date of Birth: 1966/02/09  Transition of Care [facility]) CM/SW Contact:    [name], RN Phone Number: 11/15/2023, 12:00 PM   Clinical Narrative: CM met with the patient at the bedside regarding IP Care management needs.  Patient admitted to the hospital for Septic shock from right lower extremity infected wound Right transtibial amputation with ischemic changes.  Patient was living outside in the community in a tent behind [employer] behind [employer].  Patient states that he lost his wallet, ID, and social security card and [facility].  Patient states that he fell out of his wheelchair at the bus stop in [place].  Patient states that he receives a disability check for 815.00 per month.  Patient states that he has no available family to stay with at this time and nearest, reliable relative is sister that lives in [place] .  Patient states that he lived at [place] for 1 year but left the facility.  My previous notes state that patient left the facility AMA in the past.  Patient is double amputee and WC is present in the hospital room.  Patient was seen by Dr. Harden for right transtibial amputation infection/pain.   Transition of Care Asessment: Insurance and Status: Insurance coverage has been reviewed Patient has primary care physician: No Home environment has been reviewed: Homeless - living in a tent behind [employer] near [address], [place], [place] Prior level of function:: WC bound - double Government social research officer: No current home services Social Drivers of Health Review: SDOH reviewed needs interventions Readmission risk has been reviewed: Yes Transition of care needs: transition of care needs identified, TOC will continue to follow

## 2023-11-16 DIAGNOSIS — M79671 Pain in right foot: Secondary | ICD-10-CM | POA: Diagnosis not present

## 2023-11-16 LAB — CBC
HCT: 34.8 % — ABNORMAL LOW (ref 39.0–52.0)
Hemoglobin: 11.4 g/dL — ABNORMAL LOW (ref 13.0–17.0)
MCH: 28.3 pg (ref 26.0–34.0)
MCHC: 32.8 g/dL (ref 30.0–36.0)
MCV: 86.4 fL (ref 80.0–100.0)
Platelets: 396 K/uL (ref 150–400)
RBC: 4.03 MIL/uL — ABNORMAL LOW (ref 4.22–5.81)
RDW: 14.3 % (ref 11.5–15.5)
WBC: 6.7 K/uL (ref 4.0–10.5)
nRBC: 0 % (ref 0.0–0.2)

## 2023-11-16 LAB — BASIC METABOLIC PANEL WITH GFR
Anion gap: 9 (ref 5–15)
BUN: <5 mg/dL — ABNORMAL LOW (ref 6–20)
CO2: 23 mmol/L (ref 22–32)
Calcium: 8.6 mg/dL — ABNORMAL LOW (ref 8.9–10.3)
Chloride: 102 mmol/L (ref 98–111)
Creatinine, Ser: 0.54 mg/dL — ABNORMAL LOW (ref 0.61–1.24)
GFR, Estimated: >60 mL/min (ref >60)
Glucose, Bld: 108 mg/dL — ABNORMAL HIGH (ref 70–99)
Potassium: 3.7 mmol/L (ref 3.5–5.1)
Sodium: 134 mmol/L — ABNORMAL LOW (ref 135–145)

## 2023-11-16 LAB — PHOSPHORUS: Phosphorus: 5 mg/dL — ABNORMAL HIGH (ref 2.5–4.6)

## 2023-11-16 LAB — MAGNESIUM: Magnesium: 1.7 mg/dL (ref 1.7–2.4)

## 2023-11-16 LAB — VANCOMYCIN, TROUGH: Vancomycin Tr: 8 ug/mL — ABNORMAL LOW (ref 15–20)

## 2023-11-16 MED ORDER — HYDROXYZINE HCL 10 MG PO TABS
10.0000 mg | ORAL_TABLET | Freq: Three times a day (TID) | ORAL | Status: DC | PRN
  Administered 2023-11-16 – 2023-11-19 (×2): 10 mg via ORAL
  Filled 2023-11-16 (×2): qty 1

## 2023-11-16 MED ORDER — VANCOMYCIN HCL 1250 MG/250ML IV SOLN
1250.0000 mg | Freq: Two times a day (BID) | INTRAVENOUS | Status: DC
  Administered 2023-11-16 – 2023-11-17 (×4): 1250 mg via INTRAVENOUS
  Filled 2023-11-16 (×6): qty 250

## 2023-11-16 MED ORDER — TRAMADOL HCL 50 MG PO TABS
50.0000 mg | ORAL_TABLET | Freq: Four times a day (QID) | ORAL | Status: DC | PRN
  Administered 2023-11-16 – 2023-11-20 (×5): 50 mg via ORAL
  Filled 2023-11-16 (×4): qty 1

## 2023-11-16 NOTE — Plan of Care (Signed)

## 2023-11-16 NOTE — Progress Notes (Signed)
 PROGRESS NOTE Jake Nguyen  FMW:[id number] DOB: 1965/11/27 DOA: 11/12/2023 PCP: Freddrick, No  Brief Narrative/Hospital Course: 58 year old unfortunate gentleman w/ multiple medical history w/ right BKA 08/2023, left BKA 2022, chronic alcohol  use disorder, anemia of chronic disease, chronic thrombocytopenia, history of recurrent hyponatremia secondary to chronic alcohol  use, anxiety, and depression and homelessness brought to the ED via EMS after a fall at the bus stop an[place] EMS found patient was hypotensive, tachycardic. 7/18 to 7/28 right BKA with osteomyelitis and recent hospital admission for stump infection on 7/28 treated with IV vancomycin  and ceftriaxone -orthopedic Dr. Harden was consulted recommended continue IV antibiotic to complete 7 days followed by continue oral antibiotics for 7 days patient was discharged home with Augmentin  and doxycycline  Patient is stating that he has not been compliant with the antibiotic he has been prescribed and discharged with Patient was admitted for  sepsis secondary to right BKA stump infection. X-ray ruled out osteomyelitis. In the ED UDS positive with benzo, although benzos not listed in the meds, patient remained persistently hypertensive and was transferred to ICU 8/4.  CT head negative UA negative RVP negative.  Chest x-ray clear EKG sinus tachycardia Patient transferred to TRH 8/5  Subjective: Patient seen and examined  He is alert awake resting comfortably and has no complaints  Overnight he is afebrile BP 90s-100 systolic Labs being drawn   Assessment and plan:  Hypotension-multifactorial Initially suspected septic shock-no obvious source RLE wound Right transtibial amputation with ischemic changes: Hemodynamically stabilized> transferred back to TRH service by ICU. UA and chest x-ray with no evidence of infection.  X-ray stump- no osteomyelitis. Dr. Harden consult appreciated felt to be ischemic changes on right transtibial amputation He advised  antibiotics, dressing with dry 4 x 4 gauze and if he does not improve  and remains symptomatic -likely need Rt AKA. Blood cultures from admission NGTD Continue vancomycin  IV, Zosyn -with monitoring of levels and renal function intermittently as per pharmacy Continue midodrine  10mg  tid  Left AKA status: Old.  Hypokalemia: Resolved.  Hypertension: BP stable.  On midodrine  for low blood pressure  Hyperlipidemia: Continue statin.  Substance abuse Alcohol  abuse Anxiety/depression Homelessness: Continue supportive care, CIWA Ativan  thiamine  folate.  TOC consult to help with resources.  High risk of readmission and decompensation  Mobility: OT eval  DVT prophylaxis:  Code Status:   Code Status: Full Code Family Communication: plan of care discussed with patien at bedside. Patient status is: Remains hospitalized because of severity of illness Level of care: Telemetry Medical   Dispo: The patient is from: homeless            Anticipated disposition: TBD Objective: Vitals last 24 hrs: Vitals:   11/15/23 2026 11/15/23 2350 11/16/23 0414 11/16/23 0500  BP: 113/75 98/65 103/71   Pulse: 93 99 81   Resp: 20 20 20    Temp: 98.3 F (36.8 C) 98.1 F (36.7 C) 98.4 F (36.9 C)   TempSrc:      SpO2: 96% 98% 96%   Weight:    69 kg  Height:        Physical Examination: General exam: Alert awake oriented.  Conversant HEENT:Oral mucosa moist, Ear/Nose WNL grossly Respiratory system: Bilaterally clear BS,no use of accessory muscle Cardiovascular system: S1 & S2 +, No JVD. Gastrointestinal system: Abdomen soft,NT,ND, BS+ Nervous System: Alert, awake, moving all extremities,and following commands. Extremities: Old left AKA, right foot stump with dressing in place c/d/I Skin: No rashes,no icterus. MSK: Normal muscle bulk,tone, power   Medications reviewed:  Scheduled Meds:  atorvastatin   40 mg Oral Daily   Chlorhexidine  Gluconate Cloth  6 each Topical Daily   folic acid   1 mg Oral  Daily   gabapentin   100 mg Oral TID   Gerhardt's butt cream   Topical BID   leptospermum manuka honey  1 Application Topical Daily   melatonin  5 mg Oral QHS   midodrine   10 mg Oral TID WC   multivitamin with minerals  1 tablet Oral Daily   mupirocin  ointment  1 Application Nasal BID   mouth rinse  15 mL Mouth Rinse 4 times per day   pantoprazole   40 mg Oral Daily   sodium chloride  flush  3 mL Intravenous Q12H   sodium chloride  flush  3 mL Intravenous Q12H   thiamine   100 mg Oral Daily   Continuous Infusions:  piperacillin -tazobactam 3.375 g (11/16/23 0413)   vancomycin      Diet: Diet Order             Diet regular Room service appropriate? Yes; Fluid consistency: Thin  Diet effective now                    Data Reviewed: I have personally reviewed following labs and imaging studies ( see epic result tab) CBC: Recent Labs  Lab 11/12/23 1923 11/13/23 0839 11/15/23 0440  WBC 10.9* 9.1 7.3  NEUTROABS 5.0  --   --   HGB 13.2 10.4* 10.8*  HCT 40.0 31.8* 33.5*  MCV 85.8 85.5 86.6  PLT 538* 428* 409*   CMP: Recent Labs  Lab 11/12/23 1923 11/13/23 0839 11/14/23 0437 11/15/23 0440  NA 135 136 132* 135  K 3.4* 3.7 3.8 3.7  CL 96* 98 100 102  CO2 27 27 24 24   GLUCOSE 103* 123* 98 105*  BUN <5* <5* <5* <5*  CREATININE 0.61 0.52* 0.54* 0.55*  CALCIUM  8.3* 8.2* 8.0* 8.4*  MG 2.0 1.5* 1.8 1.8  PHOS  --   --  5.0* 5.6*   GFR: Estimated Creatinine Clearance: 100.6 mL/min (A) (by C-G formula based on SCr of 0.55 mg/dL (L)). Recent Labs  Lab 11/12/23 1923 11/13/23 0839  AST 24 20  ALT 16 13  ALKPHOS 68 55  BILITOT 0.4 0.9  PROT 8.0 5.8*  ALBUMIN  3.0* 2.5*   No results for input(s): LIPASE, AMYLASE in the last 168 hours. No results for input(s): AMMONIA in the last 168 hours. Coagulation Profile:  Recent Labs  Lab 11/12/23 1923  INR 1.1   Unresulted Labs (From admission, onward)     Start     Ordered   11/15/23 0500  CBC  Daily,   R     Question:   Specimen collection method  Answer:  Lab=Lab collect   11/14/23 9167   11/14/23 0500  Phosphorus  Daily,   R     Question:  Specimen collection method  Answer:  Lab=Lab collect   11/13/23 1156   11/14/23 0500  Basic metabolic panel  Daily,   R     Question:  Specimen collection method  Answer:  Lab=Lab collect   11/13/23 1156   11/14/23 0500  Magnesium   Daily,   R     Question:  Specimen collection method  Answer:  Lab=Lab collect   11/13/23 1156   11/12/23 2313  Expectorated Sputum Assessment w Gram Stain, Rflx to Resp Cult  Once,   R       Question Answer Comment  Patient immune status  Normal   Release to patient Immediate      11/12/23 2312           Antimicrobials/Microbiology: Anti-infectives (From admission, onward)    Start     Dose/Rate Route Frequency Ordered Stop   11/16/23 1000  vancomycin  (VANCOREADY) IVPB 1250 mg/250 mL        1,250 mg 166.7 mL/hr over 90 Minutes Intravenous Every 12 hours 11/16/23 0140     11/14/23 2100  piperacillin -tazobactam (ZOSYN ) IVPB 3.375 g       Placed in Followed by Linked Group   3.375 g 12.5 mL/hr over 240 Minutes Intravenous Every 8 hours 11/14/23 2037     11/14/23 1100  vancomycin  (VANCOCIN ) 1,000 mg in sodium chloride  0.9 % 250 mL IVPB  Status:  Discontinued        1,000 mg 250 mL/hr over 60 Minutes Intravenous Every 12 hours 11/14/23 1040 11/16/23 0140   11/13/23 1000  vancomycin  (VANCOCIN ) IVPB 1000 mg/200 mL premix  Status:  Discontinued        1,000 mg 200 mL/hr over 60 Minutes Intravenous Every 12 hours 11/12/23 2047 11/14/23 1040   11/13/23 0400  piperacillin -tazobactam (ZOSYN ) IVPB 3.375 g  Status:  Discontinued       Placed in Followed by Linked Group   3.375 g 12.5 mL/hr over 240 Minutes Intravenous Every 8 hours 11/12/23 1950 11/14/23 2037   11/12/23 2000  piperacillin -tazobactam (ZOSYN ) IVPB 3.375 g       Placed in Followed by Linked Group   3.375 g 100 mL/hr over 30 Minutes Intravenous  Once 11/12/23 1950  11/12/23 2130   11/12/23 2000  vancomycin  (VANCOREADY) IVPB 1500 mg/300 mL        1,500 mg 150 mL/hr over 120 Minutes Intravenous  Once 11/12/23 1950 11/12/23 2350         Component Value Date/Time   SDES BLOOD LEFT ANTECUBITAL 11/12/2023 2114   SPECREQUEST  11/12/2023 2114    BOTTLES DRAWN AEROBIC AND ANAEROBIC Blood Culture adequate volume   CULT  11/12/2023 2114    NO GROWTH 3 DAYS Performed at [facility], [address], [place], [place] [address]    REPTSTATUS PENDING 11/12/2023 2114    Procedures:  Mennie LAMY, MD [facility] 11/16/2023, 7:48 AM

## 2023-11-16 NOTE — Progress Notes (Signed)
 Pharmacy Antibiotic Note  Jake Nguyen is a 58 y.o. male for which pharmacy has been consulted for vancomycin  and zosyn  dosing for osteomyelitis.  Patient with a history of alcohol  use disorder, depression/anxiety, homelessness, s/p left AKA (2022), s/p rt BKA (5/25) w/ recurrent stump infection.  8/7 AM update:  AUC sub-therapeutic at 384 Scr stable  Plan: Inc vancomycin  to 1250 mg IV q12h >>New estimated AUC: 478 Cont Zosyn  3.375G IV q8h to be infused over 4 hours Re-check levels as needed  Height: 5' 10 (177.8 cm) (before amputations) Weight: 69 kg (152 lb 1.9 oz) IBW/kg (Calculated) : 73  Temp (24hrs), Avg:98.4 F (36.9 C), Min:98.1 F (36.7 C), Max:98.6 F (37 C)  Recent Labs  Lab 11/12/23 1923 11/12/23 1929 11/12/23 2256 11/12/23 2313 11/13/23 0838 11/13/23 0839 11/14/23 0437 11/15/23 0440 11/15/23 1503 11/16/23 0001  WBC 10.9*  --   --   --   --  9.1  --  7.3  --   --   CREATININE 0.61  --   --   --   --  0.52* 0.54* 0.55*  --   --   LATICACIDVEN  --  3.4* 4.1* 3.1* 2.5*  --   --   --   --   --   VANCOTROUGH  --   --   --   --   --   --   --   --   --  8*  VANCOPEAK  --   --   --   --   --   --   --   --  26*  --     Estimated Creatinine Clearance: 100.6 mL/min (A) (by C-G formula based on SCr of 0.55 mg/dL (L)).    Allergies  Allergen Reactions   Pork-Derived Products Other (See Comments)    Pt states he doesn't eat pork   Lynwood Mckusick, PharmD, BCPS Clinical Pharmacist Phone: [phone]

## 2023-11-16 NOTE — Evaluation (Signed)
 Physical Therapy Evaluation Patient Details Name: THEODORE VIRGIN MRN: [id number] DOB: 1965-08-29 Today's Date: 11/16/2023  History of Present Illness  Pt is a 58 y/o male admitted to [facility] on 8/3 for fall, found to be hypotensive and tachycardic. Pt recently admitted on 7/28 for R BKA infection. PMH: etoh use, tobacco use, anxiety, depression, L AKA, R BKA  Clinical Impression  PTA pt homeless/occasional shelter stays. Pt reports independence with performing A/P transfers from bed to wheelchair and back, and grossly independent with his ADLs. Pt has lack of insight into the genesis and severity of the wound on his L residual limb and that non-weight bearing status of L residual limb means that use of limb to push off and scoot is noncompliant with NWB and is hindering chances of wound healing. With set up pt is grossly mod I for A-P transfers from bed to chair and back. Pt constantly requiring cuing for avoiding use of L LE to push off. PT will continue to follow acutely to work on improved core strength to assist in maintaining NWB as well as practice of safe transfers to and from his wheelchair. No post acute PT recommended.           Can travel by private vehicle   No    Equipment Recommendations Wheelchair cushion (measurements PT)     Functional Status Assessment Patient has had a recent decline in their functional status and demonstrates the ability to make significant improvements in function in a reasonable and predictable amount of time.     Precautions / Restrictions Precautions Precautions: Fall Recall of Precautions/Restrictions: Impaired Precaution/Restrictions Comments: R BKA wound Restrictions Weight Bearing Restrictions Per Provider Order: Yes RLE Weight Bearing Per Provider Order: Non weight bearing Other Position/Activity Restrictions: R BKA, L AKA      Mobility  Bed Mobility Overal bed mobility: Needs Assistance Bed Mobility: Supine to Sit     Supine to sit:  Supervision     General bed mobility comments: supine to long sitting supervision    Transfers Overall transfer level: Needs assistance Equipment used: None Transfers: Bed to chair/wheelchair/BSC         Anterior-Posterior transfers: Contact guard assist, Min assist  Lateral/Scoot Transfers: Supervision General transfer comment: min A to CGA, min A to lift R BKA and reduce WB on R wound. Decreased control of descent into chair and difficulty maintaining trunk control with continued R BKA elevation.    Ambulation/Gait               General Gait Details: non-ambulatory      Balance Overall balance assessment: Needs assistance Sitting-balance support: No upper extremity supported Sitting balance-Leahy Scale: Good Sitting balance - Comments: long sitting in bed.                                     Pertinent Vitals/Pain Pain Assessment Pain Assessment: Faces Faces Pain Scale: Hurts little more Pain Location: R AKA Pain Descriptors / Indicators: Discomfort, Guarding, Aching Pain Intervention(s): RN gave pain meds during session, Limited activity within patient's tolerance, Monitored during session    Home Living Family/patient expects to be discharged to:: Shelter/Homeless                   Additional Comments: pt reports only one fall recently, admits to being under the influence during event.    Prior Function Prior Level of Function :  History of Falls (last six months)             Mobility Comments: AP transfer to w/c ADLs Comments: ind     Extremity/Trunk Assessment   Upper Extremity Assessment Upper Extremity Assessment: Overall WFL for tasks assessed    Lower Extremity Assessment Lower Extremity Assessment: LLE deficits/detail RLE Deficits / Details: Recent R BKA, dressing in place LLE Deficits / Details: L AKA LLE Sensation: decreased light touch;decreased proprioception    Cervical / Trunk Assessment Cervical /  Trunk Assessment: Normal  Communication   Communication Communication: No apparent difficulties    Cognition Arousal: Alert Behavior During Therapy: WFL for tasks assessed/performed   PT - Cognitive impairments: No apparent impairments                       PT - Cognition Comments: poor correlation between non healing wound on L LE and use of residual limb to push through for transfers Following commands: Intact       Cueing Cueing Techniques: Verbal cues     General Comments General comments (skin integrity, edema, etc.): R BKA wrapped. Asked nurse to contact MD for order for air mattress given sacral wound     Assessment/Plan    PT Assessment Patient needs continued PT services  PT Problem List Pain;Impaired sensation;Decreased skin integrity;Decreased cognition;Decreased mobility;Decreased balance       PT Treatment Interventions DME instruction;Functional mobility training;Therapeutic activities;Therapeutic exercise;Balance training;Cognitive remediation;Patient/family education;Wheelchair mobility training    PT Goals (Current goals can be found in the Care Plan section)  Acute Rehab PT Goals Patient Stated Goal: not stated PT Goal Formulation: With patient Time For Goal Achievement: 11/30/23 Potential to Achieve Goals: Fair    Frequency Min 2X/week     Co-evaluation PT/OT/SLP Co-Evaluation/Treatment: Yes Reason for Co-Treatment: For patient/therapist safety;To address functional/ADL transfers PT goals addressed during session: Mobility/safety with mobility;Balance OT goals addressed during session: ADL's and self-care       AM-PAC PT 6 Clicks Mobility  Outcome Measure Help needed turning from your back to your side while in a flat bed without using bedrails?: A Little Help needed moving from lying on your back to sitting on the side of a flat bed without using bedrails?: A Little Help needed moving to and from a bed to a chair (including a  wheelchair)?: A Little Help needed standing up from a chair using your arms (e.g., wheelchair or bedside chair)?: Total Help needed to walk in hospital room?: Total Help needed climbing 3-5 steps with a railing? : Total 6 Click Score: 12    End of Session   Activity Tolerance: Patient tolerated treatment well Patient left: in bed;with call bell/phone within reach;with bed alarm set Nurse Communication: Mobility status PT Visit Diagnosis: Other abnormalities of gait and mobility (R26.89)    Time: 9140-9076 PT Time Calculation (min) (ACUTE ONLY): 24 min   Charges:   PT Evaluation $PT Eval Low Complexity: 1 Low   PT General Charges $$ ACUTE PT VISIT: 1 Visit         Callum Wolf B. Fleeta Lapidus PT, DPT Acute Rehabilitation Services Please use secure chat or  Call Office [phone]   Almarie KATHEE Fleeta Fleet 11/16/2023, 10:08 AM

## 2023-11-16 NOTE — [place] FL2 (Signed)
   MEDICAID [id number] LEVEL OF CARE FORM     IDENTIFICATION  Patient Name: Jake Nguyen Birthdate: 1965/07/03 Sex: male Admission Date (Current Location): 11/12/2023  [place] and [place] Number:  Lloyd [id number] Q Facility and Address:  [facility], [address], [place], [place] [address]      Provider Number: [id number]  Attending Physician Name and Address:  Christobal Guadalajara, MD  Relative Name and Phone Number:       Current Level of Care: Hospital Recommended Level of Care: Skilled Nursing Facility Prior Approval Number:    Date Approved/Denied:   PASRR Number:    Discharge Plan: SNF    Current Diagnoses: Patient Active Problem List   Diagnosis Date Noted   Atherosclerosis of native arteries of extremities with gangrene, right leg (HCC) 11/13/2023   Right below-knee amputee (HCC) 11/13/2023   History of osteomyelitis (HCC) 11/12/2023   Anxiety 11/12/2023   Homelessness 11/12/2023   Hyperlipidemia 11/12/2023   Malnutrition of moderate degree 09/24/2023   Cellulitis of right lower extremity 09/21/2023   Hypomagnesemia 09/21/2023   Pressure injury of buttock, stage 1 09/21/2023   Gangrene of right foot (HCC) 08/19/2023   Alcohol  use disorder, severe, dependence (HCC) 08/19/2023   Hypotension 08/19/2023   Depression 08/19/2023   Tobacco use 08/19/2023   Right foot infection 07/21/2023   Right BKA Wound infection 07/20/2023   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   Gram-negative bacteremia 11/24/2020   Cellulitis of left leg 11/19/2020   History of alcohol  withdrawal syndrome 11/19/2020   Hypokalemia 11/19/2020   Sepsis (HCC) 11/14/2020   S/P bilateral BKA (below knee amputation) (HCC)    Neuropathic pain, leg, left    Gangrene of foot (HCC) 07/14/2020   Ataxia    Pressure injury of skin 05/25/2018   Rhabdomyolysis 05/20/2018   Hyponatremia 05/20/2018   Elevated troponin 05/20/2018   Moderate dehydration 05/20/2018   Chronic  alcohol  use 05/20/2018   Transaminitis 05/20/2018    Orientation RESPIRATION BLADDER Height & Weight     Self, Time, Situation, Place  Normal Continent Weight: 69 kg Height:  5' 10 (177.8 cm) (before amputations)  BEHAVIORAL SYMPTOMS/MOOD NEUROLOGICAL BOWEL NUTRITION STATUS      Continent Diet (See discharge summary)  AMBULATORY STATUS COMMUNICATION OF NEEDS Skin   Total Care Verbally Other (Comment) (Scrum/ buttocks - stage 2, open wound to right recent BKA)                       Personal Care Assistance Level of Assistance  Bathing, Feeding, Dressing Bathing Assistance: Limited assistance Feeding assistance: Independent Dressing Assistance: Limited assistance     Functional Limitations Info  Sight, Hearing, Speech Sight Info: Adequate Hearing Info: Adequate Speech Info: Adequate    SPECIAL CARE FACTORS FREQUENCY  PT (By licensed PT), OT (By licensed OT)     PT Frequency: 3-5 x per week OT Frequency: 3-5 x per week            Contractures Contractures Info: Not present    Additional Factors Info  Code Status, Allergies, Psychotropic, Isolation Precautions Code Status Info: Full Code Allergies Info: pork products Psychotropic Info: Trazodone    Isolation Precautions Info: MRSA     Current Medications (11/16/2023):  This is the current hospital active medication list Current Facility-Administered Medications  Medication Dose Route Frequency Provider Last Rate Last Admin   acetaminophen  (TYLENOL ) tablet 650 mg  650 mg Oral Q6H PRN Sundil, Subrina, MD   650 mg  at 11/16/23 9088   Or   acetaminophen  (TYLENOL ) suppository 650 mg  650 mg Rectal Q6H PRN Sundil, Subrina, MD       atorvastatin  (LIPITOR) tablet 40 mg  40 mg Oral Daily Sundil, Subrina, MD   40 mg at 11/16/23 0859   Chlorhexidine  Gluconate Cloth 2 % PADS 6 each  6 each Topical Daily Layman Raisin, DO   6 each at 11/16/23 9085   docusate sodium  (COLACE) capsule 100 mg  100 mg Oral BID PRN Layman Raisin, DO       folic acid  (FOLVITE ) tablet 1 mg  1 mg Oral Daily Gretta Doffing P, DO   1 mg at 11/16/23 9140   gabapentin  (NEURONTIN ) capsule 100 mg  100 mg Oral TID Christobal Guadalajara, MD   100 mg at 11/16/23 9140   Gerhardt's butt cream   Topical BID Layman Raisin, DO   1 Application at 11/16/23 9140   hydrOXYzine  (ATARAX ) tablet 10 mg  10 mg Oral TID PRN Christobal Guadalajara, MD   10 mg at 11/16/23 1129   leptospermum manuka honey (MEDIHONEY) paste 1 Application  1 Application Topical Daily Layman Raisin, DO   1 Application at 11/16/23 0900   melatonin tablet 5 mg  5 mg Oral QHS Gretta Doffing P, DO   5 mg at 11/15/23 2056   midodrine  (PROAMATINE ) tablet 10 mg  10 mg Oral TID WC Sundil, Subrina, MD   10 mg at 11/16/23 9140   multivitamin with minerals tablet 1 tablet  1 tablet Oral Daily Gretta Doffing SQUIBB, DO   1 tablet at 11/16/23 9140   mupirocin  ointment (BACTROBAN ) 2 % 1 Application  1 Application Nasal BID Layman Raisin, DO   1 Application at 11/15/23 2059   Oral care mouth rinse  15 mL Mouth Rinse 4 times per day Layman Raisin, DO   15 mL at 11/16/23 9085   Oral care mouth rinse  15 mL Mouth Rinse PRN Layman Raisin, DO       pantoprazole  (PROTONIX ) EC tablet 40 mg  40 mg Oral Daily Kc, Guadalajara, MD   40 mg at 11/16/23 0859   piperacillin -tazobactam (ZOSYN ) IVPB 3.375 g  3.375 g Intravenous Q8H Pham, Minh Q, RPH-CPP 12.5 mL/hr at 11/16/23 0413 3.375 g at 11/16/23 0413   polyethylene glycol (MIRALAX  / GLYCOLAX ) packet 17 g  17 g Oral Daily PRN Layman Raisin, DO       sodium chloride  flush (NS) 0.9 % injection 3 mL  3 mL Intravenous Q12H Sundil, Subrina, MD   3 mL at 11/16/23 0913   sodium chloride  flush (NS) 0.9 % injection 3 mL  3 mL Intravenous Q12H Sundil, Subrina, MD   3 mL at 11/16/23 0914   sodium chloride  flush (NS) 0.9 % injection 3 mL  3 mL Intravenous PRN Sundil, Subrina, MD       thiamine  (VITAMIN B1) tablet 100 mg  100 mg Oral Daily Sundil, Subrina, MD   100 mg at 11/16/23 0859    traMADol  (ULTRAM ) tablet 50 mg  50 mg Oral Q6H PRN Christobal Guadalajara, MD   50 mg at 11/16/23 1129   traZODone  (DESYREL ) tablet 50 mg  50 mg Oral QHS PRN Gretta Doffing P, DO   50 mg at 11/15/23 2056   vancomycin  (VANCOREADY) IVPB 1250 mg/250 mL  1,250 mg Intravenous Q12H Clair Lynwood CROME, RPH 166.7 mL/hr at 11/16/23 1129 1,250 mg at 11/16/23 1129     Discharge Medications: Please see discharge summary for  a list of discharge medications.  Relevant Imaging Results:  Relevant Lab Results:   Additional Information SSN: [id number]  [name], RN

## 2023-11-16 NOTE — Evaluation (Signed)
 Occupational Therapy Evaluation Patient Details Name: Jake Nguyen MRN: [id number] DOB: [birth year] Today's Date: 11/16/2023   History of Present Illness   Pt is a 58 y/o male admitted to [facility] on 8/3 for fall, found to be hypotensive and tachycardic. Pt recently admitted on 7/28 for R BKA infection. PMH: etoh use, tobacco use, anxiety, depression, L AKA, R BKA     Clinical Impressions Pt admitted for above, PTA pt reports transferring to w/c via AP transfers and grossly ind with ADLs. Pt currently presents with lack of insight to his deficits, needed cues and min A to ensure RLE wound maintains NWB to reduce risk of worsening injury during AP transfers. Pt demonstrating need for mod A to setup A for ADLs, would benefit from increasing strength and core control to assist with pt keeping NWB status without ext assist. OT to follow pt while acute to progress pt as able. No post acute OT recommended.      If plan is discharge home, recommend the following:   A little help with bathing/dressing/bathroom;A little help with walking and/or transfers     Functional Status Assessment   Patient has had a recent decline in their functional status and demonstrates the ability to make significant improvements in function in a reasonable and predictable amount of time.     Equipment Recommendations   None recommended by OT     Recommendations for Other Services         Precautions/Restrictions   Precautions Precautions: Fall Recall of Precautions/Restrictions: Impaired Precaution/Restrictions Comments: R BKA wound Restrictions Weight Bearing Restrictions Per Provider Order: Yes RLE Weight Bearing Per Provider Order: Non weight bearing Other Position/Activity Restrictions: R BKA, L AKA     Mobility Bed Mobility Overal bed mobility: Needs Assistance Bed Mobility: Supine to Sit     Supine to sit: Supervision     General bed mobility comments: supine to long sitting  supervision    Transfers Overall transfer level: Needs assistance   Transfers: Bed to chair/wheelchair/BSC         Anterior-Posterior transfers: Contact guard assist, Min assist   General transfer comment: min A to CGA, min A to lift R BKA and reduce WB on R wound. Decreased control of descent into chair and difficulty maintaining trunk control with continued R BKA elevation.      Balance Overall balance assessment: Needs assistance Sitting-balance support: No upper extremity supported Sitting balance-Leahy Scale: Good Sitting balance - Comments: long sitting in bed.                                   ADL either performed or assessed with clinical judgement   ADL Overall ADL's : Needs assistance/impaired Eating/Feeding: Independent;Bed level   Grooming: Sitting;Set up;Wash/dry face   Upper Body Bathing: Sitting;Set up   Lower Body Bathing: Set up;Bed level   Upper Body Dressing : Set up;Sitting Upper Body Dressing Details (indicate cue type and reason): supported sitting in recliner Lower Body Dressing: Bed level;Moderate assistance   Toilet Transfer: Contact guard assist;Minimal assistance Toilet Transfer Details (indicate cue type and reason): AP transfer simulated with recliner min A to CGA, occasional need for Assist lifting RLE up Toileting- Clothing Manipulation and Hygiene: Bed level;Moderate assistance               Vision   Vision Assessment?: No apparent visual deficits     Perception  Praxis         Pertinent Vitals/Pain Pain Assessment Pain Assessment: Faces Faces Pain Scale: Hurts little more Pain Location: R AKA Pain Descriptors / Indicators: Discomfort, Guarding, Aching Pain Intervention(s): RN gave pain meds during session, Repositioned, Monitored during session, Limited activity within patient's tolerance     Extremity/Trunk Assessment Upper Extremity Assessment Upper Extremity Assessment: Overall WFL for  tasks assessed   Lower Extremity Assessment RLE Deficits / Details: Recent R BKA, dressing in place       Communication Communication Communication: No apparent difficulties   Cognition Arousal: Alert Behavior During Therapy: WFL for tasks assessed/performed       Awareness: Intellectual awareness intact, Online awareness impaired       OT - Cognition Comments: cues for pt to understanding why he must limit WB to R AKA                 Following commands: Intact       Cueing  General Comments   Cueing Techniques: Verbal cues  R BKA wrapped.   Exercises     Shoulder Instructions      Home Living Family/patient expects to be discharged to:: Shelter/Homeless                                 Additional Comments: pt reports only one fall recently, admits to being under the influence during event.      Prior Functioning/Environment Prior Level of Function : History of Falls (last six months)             Mobility Comments: AP transfer to w/c ADLs Comments: ind    OT Problem List: Decreased safety awareness;Impaired balance (sitting and/or standing);Decreased knowledge of precautions   OT Treatment/Interventions: Self-care/ADL training;Therapeutic exercise;Patient/family education;Balance training;Therapeutic activities      OT Goals(Current goals can be found in the care plan section)   Acute Rehab OT Goals Patient Stated Goal: did not state OT Goal Formulation: With patient Time For Goal Achievement: 11/30/23 Potential to Achieve Goals: Good ADL Goals Pt Will Transfer to Toilet: with set-up;bedside commode (drop arm commode with lateral scoot vs AP transfer) Pt/caregiver will Perform Home Exercise Program: Increased strength;Both right and left upper extremity;Independently;With written HEP provided (to promote AP transfer indepdence) Additional ADL Goal #2: Pt will be independent with core exercises to promote trunk control while  limiting R AKA WB during transfers.   OT Frequency:  Min 1X/week    Co-evaluation PT/OT/SLP Co-Evaluation/Treatment: Yes Reason for Co-Treatment: For patient/therapist safety;To address functional/ADL transfers PT goals addressed during session: Mobility/safety with mobility;Balance OT goals addressed during session: ADL's and self-care      AM-PAC OT 6 Clicks Daily Activity     Outcome Measure Help from another person eating meals?: None Help from another person taking care of personal grooming?: A Little Help from another person toileting, which includes using toliet, bedpan, or urinal?: A Little Help from another person bathing (including washing, rinsing, drying)?: A Little Help from another person to put on and taking off regular upper body clothing?: A Little Help from another person to put on and taking off regular lower body clothing?: A Little 6 Click Score: 19   End of Session Nurse Communication: Mobility status  Activity Tolerance: Patient tolerated treatment well Patient left: in bed;with call bell/phone within reach;with bed alarm set  OT Visit Diagnosis: Other abnormalities of gait and mobility (R26.89);History of falling (Z91.81)  Time: 9141-9076 OT Time Calculation (min): 25 min Charges:  OT General Charges $OT Visit: 1 Visit OT Evaluation $OT Eval Low Complexity: 1 Low  11/16/2023  AB, OTR/L  Acute Rehabilitation Services  Office: [phone]   Curtistine JONETTA Das 11/16/2023, 9:44 AM

## 2023-11-17 DIAGNOSIS — T148XXA Other injury of unspecified body region, initial encounter: Secondary | ICD-10-CM | POA: Diagnosis not present

## 2023-11-17 DIAGNOSIS — L089 Local infection of the skin and subcutaneous tissue, unspecified: Secondary | ICD-10-CM | POA: Diagnosis not present

## 2023-11-17 LAB — CBC
HCT: 33.4 % — ABNORMAL LOW (ref 39.0–52.0)
Hemoglobin: 10.6 g/dL — ABNORMAL LOW (ref 13.0–17.0)
MCH: 28.1 pg (ref 26.0–34.0)
MCHC: 31.7 g/dL (ref 30.0–36.0)
MCV: 88.6 fL (ref 80.0–100.0)
Platelets: 410 K/uL — ABNORMAL HIGH (ref 150–400)
RBC: 3.77 MIL/uL — ABNORMAL LOW (ref 4.22–5.81)
RDW: 14.2 % (ref 11.5–15.5)
WBC: 6.3 K/uL (ref 4.0–10.5)
nRBC: 0 % (ref 0.0–0.2)

## 2023-11-17 LAB — BASIC METABOLIC PANEL WITH GFR
Anion gap: 9 (ref 5–15)
BUN: <5 mg/dL — ABNORMAL LOW (ref 6–20)
CO2: 25 mmol/L (ref 22–32)
Calcium: 8.5 mg/dL — ABNORMAL LOW (ref 8.9–10.3)
Chloride: 104 mmol/L (ref 98–111)
Creatinine, Ser: 0.59 mg/dL — ABNORMAL LOW (ref 0.61–1.24)
GFR, Estimated: >60 mL/min (ref >60)
Glucose, Bld: 135 mg/dL — ABNORMAL HIGH (ref 70–99)
Potassium: 3.9 mmol/L (ref 3.5–5.1)
Sodium: 138 mmol/L (ref 135–145)

## 2023-11-17 LAB — CULTURE, BLOOD (ROUTINE X 2)
Culture: NO GROWTH
Culture: NO GROWTH
Special Requests: ADEQUATE
Special Requests: ADEQUATE

## 2023-11-17 LAB — MAGNESIUM: Magnesium: 1.6 mg/dL — ABNORMAL LOW (ref 1.7–2.4)

## 2023-11-17 LAB — PHOSPHORUS: Phosphorus: 4.4 mg/dL (ref 2.5–4.6)

## 2023-11-17 NOTE — Plan of Care (Signed)

## 2023-11-17 NOTE — Progress Notes (Signed)
 Mobility Specialist: Progress Note   11/17/23 1500  Mobility  Activity Pivoted/transferred from bed to chair;Pivoted/transferred from chair to bed  Level of Assistance Minimal assist, patient does 75% or more  Range of Motion/Exercises Right arm;Left arm  RLE Weight Bearing Per Provider Order NWB  Activity Response Tolerated well  Mobility Referral Yes  Mobility visit 1 Mobility  Mobility Specialist Start Time (ACUTE ONLY) [id number]  Mobility Specialist Stop Time (ACUTE ONLY) [id number]  Mobility Specialist Time Calculation (min) (ACUTE ONLY) 11 min    Pt received in bed, agreeable to mobility session. Performed A/P transfer from bed to chair with minA needed to hold RLE and prevent WB during transfer. Completed chair sit-ups and pushup ind (10-15x each exs). Requested to return to bed at EOS. Left in bed in bed with all needs met, call bell in reach.   Ileana Lute Mobility Specialist Please contact via SecureChat or Rehab office at [phone]

## 2023-11-17 NOTE — Progress Notes (Addendum)
 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 7/18 to 7/28 right BKA with osteomyelitis and recent hospital admission for stump infection on 7/28 treated with IV vancomycin  and ceftriaxone -orthopedic Dr. Harden was consulted recommended continue IV antibiotic to complete 7 days followed by continue oral antibiotics for 7 days patient was discharged home with Augmentin  and doxycycline  Patient is stating that he has not been compliant with the antibiotic he has been prescribed and discharged with Patient was admitted for  sepsis secondary to right BKA stump infection. X-ray ruled out osteomyelitis. In the ED UDS positive with benzo, although benzos not listed in the meds, patient remained persistently hypertensive and was transferred to ICU 8/4.  CT head negative UA negative RVP negative.  Chest x-ray clear EKG sinus tachycardia Patient transferred to [facility] 8/5  Subjective: Patient seen and examined  Resting comfortably. Overnight afebrile BP stable Labs with stable renal function electrolytes and chronic anemia  Assessment and plan:  Hypotension-multifactorial Initially suspected septic shock-no obvious source RLE wound Right transtibial amputation with ischemic changes: Hemodynamically stabilized> transferred back to TRH service by ICU. UA and chest x-ray with no evidence of infection.  X-ray stump- no osteomyelitis. Dr. Harden consult appreciated - felt to be ischemic changes on right transtibial amputation-advised antibiotics,  dressing  w/ dry 4x4  If he does not improve and remains symptomatic -likely need Rt AKA. Blood cultures from admission NGTD Continue vancomycin  IV, Zosyn -with monitoring of levels and renal function intermittently as per pharmacy Continue midodrine  10mg  tid  Left AKA status: Old.  Hypokalemia: Resolved.  Hypertension: On midodrine  for low blood pressure  Hyperlipidemia: Continue statin.  Substance abuse Alcohol  abuse Anxiety/depression Homelessness: Continue supportive care.  No signs of withdrawal.  Continue multivitamin thiamine .  TOC consult for resources.  He is homeless and disposition will be difficult  Mobility: PPT/T eval  DVT prophylaxis:  Code Status:   Code Status: Full Code Family Communication: plan of care discussed with patien at bedside. Patient status is: Remains hospitalized because of severity of illness Level of care: Telemetry Medical   Dispo: The patient is from: homeless            Anticipated disposition: TBD Objective: Vitals last 24 hrs: Vitals:   11/16/23 1700 11/16/23 1952 11/17/23 0500 11/17/23 0735  BP: 113/78 121/75  110/80  Pulse: 85 74  73  Resp: 18 17  16   Temp: 98.1 F (36.7 C) 97.9 F (36.6 C)  97.6 F (36.4 C)  TempSrc: Oral Oral    SpO2: 100% 98%  98%  Weight:   67.7 kg   Height:        Physical Examination: General exam: AAOX3 HEENT:Oral mucosa moist, Ear/Nose WNL grossly Respiratory system: Bilaterally clear BS,no use of accessory muscle Cardiovascular system: S1 & S2 +, No JVD. Gastrointestinal system: Abdomen soft,NT,ND, BS+ Nervous System: Alert, awake, moving all extremities,and following commands. Extremities: left AKA, right foot stump with dressing intact c/d/I-later dressing was removed, picture taken see below Skin: No rashes,no icterus. MSK: Normal muscle bulk,tone, power   Medications reviewed:  Scheduled Meds:  atorvastatin   40 mg  Oral Daily   Chlorhexidine  Gluconate Cloth  6 each Topical Daily    folic acid   1 mg Oral Daily   gabapentin   100 mg Oral TID   Gerhardt's butt cream   Topical BID   leptospermum manuka honey  1 Application Topical Daily   melatonin  5 mg Oral QHS   midodrine   10 mg Oral TID WC   multivitamin with minerals  1 tablet Oral Daily   mupirocin  ointment  1 Application Nasal BID   mouth rinse  15 mL Mouth Rinse 4 times per day   pantoprazole   40 mg Oral Daily   sodium chloride  flush  3 mL Intravenous Q12H   sodium chloride  flush  3 mL Intravenous Q12H   thiamine   100 mg Oral Daily   Continuous Infusions:  piperacillin -tazobactam 3.375 g (11/17/23 0451)   vancomycin  1,250 mg (11/16/23 2152)   Diet: Diet Order             Diet regular Room service appropriate? Yes; Fluid consistency: Thin  Diet effective now                         Data Reviewed: I have personally reviewed following labs and imaging studies ( see epic result tab) CBC: Recent Labs  Lab 11/12/23 1923 11/13/23 0839 11/15/23 0440 11/16/23 0734 11/17/23 0239  WBC 10.9* 9.1 7.3 6.7 6.3  NEUTROABS 5.0  --   --   --   --   HGB 13.2 10.4* 10.8* 11.4* 10.6*  HCT 40.0 31.8* 33.5* 34.8* 33.4*  MCV 85.8 85.5 86.6 86.4 88.6  PLT 538* 428* 409* 396 410*   CMP: Recent Labs  Lab 11/13/23 0839 11/14/23 0437 11/15/23 0440 11/16/23 0734 11/17/23 0239  NA 136 132* 135 134* 138  K 3.7 3.8 3.7 3.7 3.9  CL 98 100 102 102 104  CO2 27 24 24 23 25   GLUCOSE 123* 98 105* 108* 135*  BUN <5* <5* <5* <5* <5*  CREATININE 0.52* 0.54* 0.55* 0.54* 0.59*  CALCIUM  8.2* 8.0* 8.4* 8.6* 8.5*  MG 1.5* 1.8 1.8 1.7 1.6*  PHOS  --  5.0* 5.6* 5.0* 4.4   GFR: Estimated Creatinine Clearance: 98.7 mL/min (A) (by C-G formula based on SCr of 0.59 mg/dL (L)). Recent Labs  Lab 11/12/23 1923 11/13/23 0839  AST 24 20  ALT 16 13  ALKPHOS 68 55  BILITOT 0.4 0.9  PROT 8.0 5.8*  ALBUMIN  3.0* 2.5*   No results for input(s): LIPASE, AMYLASE in the last 168 hours. No results for input(s): AMMONIA in  the last 168 hours. Coagulation Profile:  Recent Labs  Lab 11/12/23 1923  INR 1.1   Unresulted Labs (From admission, onward)     Start     Ordered   11/19/23 0500  CBC  Every 48 hours,   R     Question:  Specimen collection method  Answer:  Lab=Lab collect   11/17/23 0830   11/19/23 0500  Basic metabolic panel  Every 48 hours,   R     Question:  Specimen collection method  Answer:  Lab=Lab collect   11/17/23 0830   11/12/23 2313  Expectorated Sputum Assessment w Gram Stain, Rflx to Resp Cult  Once,   R       Question Answer Comment  Patient immune status Normal   Release to patient Immediate      11/12/23 2312  Antimicrobials/Microbiology: Anti-infectives (From admission, onward)    Start     Dose/Rate Route Frequency Ordered Stop   11/16/23 1000  vancomycin  (VANCOREADY) IVPB 1250 mg/250 mL        1,250 mg 166.7 mL/hr over 90 Minutes Intravenous Every 12 hours 11/16/23 0140     11/14/23 2100  piperacillin -tazobactam (ZOSYN ) IVPB 3.375 g       Placed in Followed by Linked Group   3.375 g 12.5 mL/hr over 240 Minutes Intravenous Every 8 hours 11/14/23 2037     11/14/23 1100  vancomycin  (VANCOCIN ) 1,000 mg in sodium chloride  0.9 % 250 mL IVPB  Status:  Discontinued        1,000 mg 250 mL/hr over 60 Minutes Intravenous Every 12 hours 11/14/23 1040 11/16/23 0140   11/13/23 1000  vancomycin  (VANCOCIN ) IVPB 1000 mg/200 mL premix  Status:  Discontinued        1,000 mg 200 mL/hr over 60 Minutes Intravenous Every 12 hours 11/12/23 2047 11/14/23 1040   11/13/23 0400  piperacillin -tazobactam (ZOSYN ) IVPB 3.375 g  Status:  Discontinued       Placed in Followed by Linked Group   3.375 g 12.5 mL/hr over 240 Minutes Intravenous Every 8 hours 11/12/23 1950 11/14/23 2037   11/12/23 2000  piperacillin -tazobactam (ZOSYN ) IVPB 3.375 g       Placed in Followed by Linked Group   3.375 g 100 mL/hr over 30 Minutes Intravenous  Once 11/12/23 1950 11/12/23 2130   11/12/23 2000   vancomycin  (VANCOREADY) IVPB 1500 mg/300 mL        1,500 mg 150 mL/hr over 120 Minutes Intravenous  Once 11/12/23 1950 11/12/23 2350         Component Value Date/Time   SDES BLOOD LEFT ANTECUBITAL 11/12/2023 2114   SPECREQUEST  11/12/2023 2114    BOTTLES DRAWN AEROBIC AND ANAEROBIC Blood Culture adequate volume   CULT  11/12/2023 2114    NO GROWTH 5 DAYS Performed at [facility], [address], [place], [place] [address]    REPTSTATUS 11/17/2023 FINAL 11/12/2023 2114      Procedures:  Mennie LAMY, MD [facility] 11/17/2023, 10:40 AM

## 2023-11-18 DIAGNOSIS — T148XXA Other injury of unspecified body region, initial encounter: Secondary | ICD-10-CM | POA: Diagnosis not present

## 2023-11-18 DIAGNOSIS — L089 Local infection of the skin and subcutaneous tissue, unspecified: Secondary | ICD-10-CM | POA: Diagnosis not present

## 2023-11-18 MED ORDER — DOXYCYCLINE HYCLATE 100 MG PO TABS
100.0000 mg | ORAL_TABLET | Freq: Two times a day (BID) | ORAL | Status: DC
  Administered 2023-11-18 – 2023-11-22 (×14): 100 mg via ORAL
  Filled 2023-11-18 (×9): qty 1

## 2023-11-18 MED ORDER — RIVAROXABAN 10 MG PO TABS
10.0000 mg | ORAL_TABLET | Freq: Every day | ORAL | Status: DC
  Administered 2023-11-18 – 2023-11-22 (×8): 10 mg via ORAL
  Filled 2023-11-18 (×5): qty 1

## 2023-11-18 MED ORDER — MAGNESIUM SULFATE 2 GM/50ML IV SOLN
2.0000 g | Freq: Once | INTRAVENOUS | Status: AC
  Administered 2023-11-18: 2 g via INTRAVENOUS
  Filled 2023-11-18: qty 50

## 2023-11-18 MED ORDER — AMOXICILLIN-POT CLAVULANATE 875-125 MG PO TABS
1.0000 | ORAL_TABLET | Freq: Two times a day (BID) | ORAL | Status: DC
  Administered 2023-11-18 – 2023-11-22 (×14): 1 via ORAL
  Filled 2023-11-18 (×9): qty 1

## 2023-11-18 NOTE — Progress Notes (Signed)
 Patient ID: Jake Nguyen, male   DOB: Jun 26, 1965, 58 y.o.   MRN: [id number] Patient states his leg is feeling better.  Currently undergoing daily Vashe dressing changes.  Examination the incision has flat granulation tissue.  The ischemic changes are improving.  Would continue with Vashe dressing changes daily.  No surgery indicated at this time.

## 2023-11-18 NOTE — Plan of Care (Signed)
   Problem: Education: Goal: Knowledge of General Education information will improve Description: Including pain rating scale, medication(s)/side effects and non-pharmacologic comfort measures Outcome: Progressing   Problem: Pain Managment: Goal: General experience of comfort will improve and/or be controlled Outcome: Progressing   Problem: Safety: Goal: Ability to remain free from injury will improve Outcome: Progressing

## 2023-11-18 NOTE — Plan of Care (Signed)

## 2023-11-18 NOTE — Progress Notes (Signed)
 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 7/18 to 7/28 right BKA with osteomyelitis and recent hospital admission for stump infection on 7/28 treated with IV vancomycin  and ceftriaxone -orthopedic Dr. Harden was consulted recommended continue IV antibiotic to complete 7 days followed by continue oral antibiotics for 7 days patient was discharged home with Augmentin  and doxycycline  Patient is stating that he has not been compliant with the antibiotic he has been prescribed and discharged with Patient was admitted for  sepsis secondary to right BKA stump infection. X-ray ruled out osteomyelitis. In the ED UDS positive with benzo, although benzos not listed in the meds, patient remained persistently hypertensive and was transferred to ICU 8/4.  CT head negative UA negative RVP negative.  Chest x-ray clear EKG sinus tachycardia Patient transferred to [facility] 8/5  Subjective: Patient seen and examined  No new complaints Asking for soda Overnight he remains afebrile BP stable  Assessment and plan:  Hypotension-multifactorial Initially suspected septic shock-no obvious source Right transtibial amputation with ischemic changes: Hemodynamically stabilized> transferred back to [facility] service by ICU. UA and chest x-ray with no evidence of infection.  X-ray stump- no osteomyelitis. Dr. Harden is following closely presentation felt to be ischemic changes on right transtibial amputation-advised antibiotics, dressing Ischemic changes are improving,  no surgery indicated at this time and okay for SNF placement On IV antibiotics will de-escalate to p.o doxy/augmentin  for 1 wk  Blood cultures from admission NGTD Continue midodrine  10mg  tid  Left AKA status: Old.stable.  Hypokalemia: Resolved.  Hypotension History of hypertension: Stable continue midodrine   Hyperlipidemia: Continue statin.  Substance abuse Alcohol  abuse Anxiety/depression Homelessness: Continue supportive care.No signs of withdrawal.  Continue multivitamin thiamine . TOC consult for resources.  He is homeless and disposition will be difficult  Mobility: PPT/T eval plan for SNF  DVT prophylaxis:  Code Status:   Code Status: Full Code Family Communication: plan of care discussed with patien at bedside. Patient status is: Remains hospitalized because of severity of illness Level of care: Telemetry Medical   Dispo: The patient is from: homeless            Anticipated disposition: TBD Objective: Vitals last 24 hrs: Vitals:   11/17/23 2038 11/17/23 2343 11/18/23 0500 11/18/23 0811  BP: 107/65 106/66  104/79  Pulse: 60 77  91  Resp: 20 20    Temp: 98.3 F (36.8 C) 98.4 F (36.9 C)  97.7 F (36.5 C)  TempSrc: Oral Oral  Oral  SpO2: 97% 98%  97%  Weight:   67.7 kg   Height:        Physical Examination: General exam: AAOX3 HEENT:Oral mucosa moist, Ear/Nose WNL grossly Respiratory system: B/L Clear BS,no use of accessory muscle Cardiovascular system: S1 & S2 +, No JVD. Gastrointestinal system: Abdomen soft,NT,ND, BS+ Nervous System: Alert, awake, moving all extremities,and following commands. Extremities: left AKA, Rt  BKA stump with dressing intact see pic Skin: No rashes,no icterus. MSK: Normal muscle bulk,tone, power   Medications reviewed:  Scheduled Meds:  amoxicillin -clavulanate  1 tablet Oral Q12H   atorvastatin   40 mg Oral Daily   Chlorhexidine   Gluconate Cloth  6 each Topical Daily   doxycycline   100 mg Oral Q12H   folic acid   1 mg  Oral Daily   gabapentin   100 mg Oral TID   Gerhardt's butt cream   Topical BID   leptospermum manuka honey  1 Application Topical Daily   melatonin  5 mg Oral QHS   midodrine   10 mg Oral TID WC   multivitamin with minerals  1 tablet Oral Daily   mupirocin  ointment  1 Application Nasal BID   mouth rinse  15 mL Mouth Rinse 4 times per day   pantoprazole   40 mg Oral Daily   sodium chloride  flush  3 mL Intravenous Q12H   sodium chloride  flush  3 mL Intravenous Q12H   thiamine   100 mg Oral Daily   Continuous Infusions:   Diet: Diet Order             Diet regular Room service appropriate? Yes; Fluid consistency: Thin  Diet effective now                         Data Reviewed: I have personally reviewed following labs and imaging studies ( see epic result tab) CBC: Recent Labs  Lab 11/12/23 1923 11/13/23 0839 11/15/23 0440 11/16/23 0734 11/17/23 0239  WBC 10.9* 9.1 7.3 6.7 6.3  NEUTROABS 5.0  --   --   --   --   HGB 13.2 10.4* 10.8* 11.4* 10.6*  HCT 40.0 31.8* 33.5* 34.8* 33.4*  MCV 85.8 85.5 86.6 86.4 88.6  PLT 538* 428* 409* 396 410*   CMP: Recent Labs  Lab 11/13/23 0839 11/14/23 0437 11/15/23 0440 11/16/23 0734 11/17/23 0239  NA 136 132* 135 134* 138  K 3.7 3.8 3.7 3.7 3.9  CL 98 100 102 102 104  CO2 27 24 24 23 25   GLUCOSE 123* 98 105* 108* 135*  BUN <5* <5* <5* <5* <5*  CREATININE 0.52* 0.54* 0.55* 0.54* 0.59*  CALCIUM  8.2* 8.0* 8.4* 8.6* 8.5*  MG 1.5* 1.8 1.8 1.7 1.6*  PHOS  --  5.0* 5.6* 5.0* 4.4   GFR: Estimated Creatinine Clearance: 98.7 mL/min (A) (by C-G formula based on SCr of 0.59 mg/dL (L)). Recent Labs  Lab 11/12/23 1923 11/13/23 0839  AST 24 20  ALT 16 13  ALKPHOS 68 55  BILITOT 0.4 0.9  PROT 8.0 5.8*  ALBUMIN  3.0* 2.5*   No results for input(s): LIPASE, AMYLASE in the last 168 hours. No results for input(s): AMMONIA in the last 168 hours. Coagulation Profile:  Recent Labs  Lab 11/12/23 1923  INR 1.1   Unresulted Labs  (From admission, onward)     Start     Ordered   11/19/23 0500  CBC  Every 48 hours,   R     Question:  Specimen collection method  Answer:  Lab=Lab collect   11/17/23 0830   11/19/23 0500  Basic metabolic panel  Every 48 hours,   R     Question:  Specimen collection method  Answer:  Lab=Lab collect   11/17/23 0830   11/12/23 2313  Expectorated Sputum Assessment w Gram Stain, Rflx to Resp Cult  Once,   R       Question Answer Comment  Patient immune status Normal   Release to patient Immediate      11/12/23 2312           Antimicrobials/Microbiology: Anti-infectives (From admission, onward)    Start  Dose/Rate Route Frequency Ordered Stop   11/18/23 1200  amoxicillin -clavulanate (AUGMENTIN ) 875-125 MG per tablet 1 tablet        1 tablet Oral Every 12 hours 11/18/23 0914     11/18/23 1000  doxycycline  (VIBRA -TABS) tablet 100 mg        100 mg Oral Every 12 hours 11/18/23 0914     11/16/23 1000  vancomycin  (VANCOREADY) IVPB 1250 mg/250 mL  Status:  Discontinued        1,250 mg 166.7 mL/hr over 90 Minutes Intravenous Every 12 hours 11/16/23 0140 11/18/23 0924   11/14/23 2100  piperacillin -tazobactam (ZOSYN ) IVPB 3.375 g  Status:  Discontinued       Placed in Followed by Linked Group   3.375 g 12.5 mL/hr over 240 Minutes Intravenous Every 8 hours 11/14/23 2037 11/18/23 0922   11/14/23 1100  vancomycin  (VANCOCIN ) 1,000 mg in sodium chloride  0.9 % 250 mL IVPB  Status:  Discontinued        1,000 mg 250 mL/hr over 60 Minutes Intravenous Every 12 hours 11/14/23 1040 11/16/23 0140   11/13/23 1000  vancomycin  (VANCOCIN ) IVPB 1000 mg/200 mL premix  Status:  Discontinued        1,000 mg 200 mL/hr over 60 Minutes Intravenous Every 12 hours 11/12/23 2047 11/14/23 1040   11/13/23 0400  piperacillin -tazobactam (ZOSYN ) IVPB 3.375 g  Status:  Discontinued       Placed in Followed by Linked Group   3.375 g 12.5 mL/hr over 240 Minutes Intravenous Every 8 hours 11/12/23 1950 11/14/23  2037   11/12/23 2000  piperacillin -tazobactam (ZOSYN ) IVPB 3.375 g       Placed in Followed by Linked Group   3.375 g 100 mL/hr over 30 Minutes Intravenous  Once 11/12/23 1950 11/12/23 2130   11/12/23 2000  vancomycin  (VANCOREADY) IVPB 1500 mg/300 mL        1,500 mg 150 mL/hr over 120 Minutes Intravenous  Once 11/12/23 1950 11/12/23 2350         Component Value Date/Time   SDES BLOOD LEFT ANTECUBITAL 11/12/2023 2114   SPECREQUEST  11/12/2023 2114    BOTTLES DRAWN AEROBIC AND ANAEROBIC Blood Culture adequate volume   CULT  11/12/2023 2114    NO GROWTH 5 DAYS Performed at [facility], [address], [place], [place] [address]    REPTSTATUS 11/17/2023 FINAL 11/12/2023 2114      Procedures:  Mennie LAMY, MD [facility] 11/18/2023, 9:55 AM

## 2023-11-19 DIAGNOSIS — L089 Local infection of the skin and subcutaneous tissue, unspecified: Secondary | ICD-10-CM | POA: Diagnosis not present

## 2023-11-19 DIAGNOSIS — T148XXA Other injury of unspecified body region, initial encounter: Secondary | ICD-10-CM | POA: Diagnosis not present

## 2023-11-19 LAB — BASIC METABOLIC PANEL WITH GFR
Anion gap: 10 (ref 5–15)
BUN: 6 mg/dL (ref 6–20)
CO2: 24 mmol/L (ref 22–32)
Calcium: 8.9 mg/dL (ref 8.9–10.3)
Chloride: 103 mmol/L (ref 98–111)
Creatinine, Ser: 0.56 mg/dL — ABNORMAL LOW (ref 0.61–1.24)
GFR, Estimated: >60 mL/min (ref >60)
Glucose, Bld: 108 mg/dL — ABNORMAL HIGH (ref 70–99)
Potassium: 3.8 mmol/L (ref 3.5–5.1)
Sodium: 137 mmol/L (ref 135–145)

## 2023-11-19 LAB — CBC
HCT: 35.4 % — ABNORMAL LOW (ref 39.0–52.0)
Hemoglobin: 11.5 g/dL — ABNORMAL LOW (ref 13.0–17.0)
MCH: 28.3 pg (ref 26.0–34.0)
MCHC: 32.5 g/dL (ref 30.0–36.0)
MCV: 87.2 fL (ref 80.0–100.0)
Platelets: 375 K/uL (ref 150–400)
RBC: 4.06 MIL/uL — ABNORMAL LOW (ref 4.22–5.81)
RDW: 14.4 % (ref 11.5–15.5)
WBC: 6.9 K/uL (ref 4.0–10.5)
nRBC: 0 % (ref 0.0–0.2)

## 2023-11-19 MED ORDER — MIDODRINE HCL 5 MG PO TABS
5.0000 mg | ORAL_TABLET | Freq: Three times a day (TID) | ORAL | Status: DC
  Administered 2023-11-19 – 2023-11-21 (×10): 5 mg via ORAL
  Filled 2023-11-19 (×6): qty 1

## 2023-11-19 NOTE — Plan of Care (Signed)

## 2023-11-19 NOTE — Progress Notes (Signed)
 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 7/18 to 7/28 right BKA with osteomyelitis and recent hospital admission for stump infection on 7/28 treated with IV vancomycin  and ceftriaxone -orthopedic Dr. Harden was consulted recommended continue IV antibiotic to complete 7 days followed by continue oral antibiotics for 7 days patient was discharged home with Augmentin  and doxycycline  Patient is stating that he has not been compliant with the antibiotic he has been prescribed and discharged with Patient was admitted for  sepsis secondary to right BKA stump infection. X-ray ruled out osteomyelitis. In the ED UDS positive with benzo, although benzos not listed in the meds, patient remained persistently hypertensive and was transferred to ICU 8/4.  CT head negative UA negative RVP negative.  Chest x-ray clear EKG sinus tachycardia Patient transferred to [facility] 8/5  Subjective: Patient seen and examined today Resting comfortably voiding well no complaints He remains afebrile hemodynamically stable labs this morning unchanged  Assessment and plan:  Hypotension-multifactorial Initially suspected septic shock-no obvious source Right transtibial amputation with ischemic changes: Hemodynamically stabilized> transferred back to [facility] service by ICU. UA and chest x-ray with no evidence of infection.  X-ray stump- no osteomyelitis. Dr. Harden input appreciated- felt to be ischemic changes on right transtibial amputation-advised antibiotics,  dressing Ischemic changes are improving, no surgery indicated at this time and okay for SNF placement On IV antibiotic  de-escalated to p.o doxy/augmentin  11/18/23 cont for 1 wk Blood cultures from admission NGTD Continue midodrine  10mg  tid  Left AKA status: Old.stable.  Hypokalemia: Resolved.  Hypotension History of hypertension: Stable continue midodrine   Hyperlipidemia: Continue statin.  Substance abuse Alcohol  abuse Anxiety/depression Homelessness: Continue supportive care.No signs of withdrawal.  Continue multivitamin thiamine . TOC consult for resources.  He is homeless and disposition will be difficult  Mobility: PPT/T eval plan for SNF  DVT prophylaxis: rivaroxaban  (XARELTO ) tablet 10 mg Start: 11/18/23 1345 Code Status:   Code Status: Full Code Family Communication: plan of care discussed with patien at bedside. Patient status is: Remains hospitalized because of severity of illness Level of care: Telemetry Medical   Dispo: The patient is from: homeless            Anticipated disposition: Awaiting placement. Objective: Vitals last 24 hrs: Vitals:   11/18/23 1707 11/18/23 2122 11/19/23 0500 11/19/23 0822  BP: 114/79 126/71  113/69  Pulse: 84 64  61  Resp: 18 20  16   Temp: 98.4 F (36.9 C) 98.2 F (36.8 C)  98 F (36.7 C)  TempSrc: Oral     SpO2: 100% 97%  100%  Weight:   67.8 kg   Height:        Physical Examination: General exam: Alert awake oriented, pleasant  HEENT:Oral mucosa moist, Ear/Nose WNL grossly Respiratory system: B/L Clear BS,no use of accessory muscle Cardiovascular system: S1 & S2 +, No JVD. Gastrointestinal system: Abdomen soft,NT,ND, BS+ Nervous System: Alert, awake, moving all extremities,and following commands. Extremities: left AKA old, Rt  BKA stump with dressing intact- wound healing Skin: No rashes,no icterus. MSK: Normal muscle bulk,tone, power   Medications reviewed:  Scheduled Meds:  amoxicillin -clavulanate  1 tablet  Oral Q12H   atorvastatin   40 mg Oral Daily   Chlorhexidine  Gluconate Cloth  6 each Topical Daily   doxycycline   100 mg Oral Q12H   folic acid   1 mg Oral Daily   gabapentin   100 mg Oral TID   Gerhardt's butt cream   Topical BID   leptospermum manuka honey  1 Application Topical Daily   melatonin  5 mg Oral QHS   midodrine   10 mg Oral TID WC   multivitamin with minerals  1 tablet Oral Daily   pantoprazole   40 mg Oral Daily   rivaroxaban   10 mg Oral Daily   sodium chloride  flush  3 mL Intravenous Q12H   sodium chloride  flush  3 mL Intravenous Q12H   thiamine   100 mg Oral Daily   Continuous Infusions:   Diet: Diet Order             Diet regular Room service appropriate? Yes; Fluid consistency: Thin  Diet effective now                         Data Reviewed: I have personally reviewed following labs and imaging studies ( see epic result tab) CBC: Recent Labs  Lab 11/12/23 1923 11/13/23 0839 11/15/23 0440 11/16/23 0734 11/17/23 0239 11/19/23 0603  WBC 10.9* 9.1 7.3 6.7 6.3 6.9  NEUTROABS 5.0  --   --   --   --   --   HGB 13.2 10.4* 10.8* 11.4* 10.6* 11.5*  HCT 40.0 31.8* 33.5* 34.8* 33.4* 35.4*  MCV 85.8 85.5 86.6 86.4 88.6 87.2  PLT 538* 428* 409* 396 410* 375   CMP: Recent Labs  Lab 11/13/23 0839 11/14/23 0437 11/15/23 0440 11/16/23 0734 11/17/23 0239 11/19/23 0603  NA 136 132* 135 134* 138 137  K 3.7 3.8 3.7 3.7 3.9 3.8  CL 98 100 102 102 104 103  CO2 27 24 24 23 25 24   GLUCOSE 123* 98 105* 108* 135* 108*  BUN <5* <5* <5* <5* <5* 6  CREATININE 0.52* 0.54* 0.55* 0.54* 0.59* 0.56*  CALCIUM  8.2* 8.0* 8.4* 8.6* 8.5* 8.9  MG 1.5* 1.8 1.8 1.7 1.6*  --   PHOS  --  5.0* 5.6* 5.0* 4.4  --    GFR: Estimated Creatinine Clearance: 98.9 mL/min (A) (by C-G formula based on SCr of 0.56 mg/dL (L)). Recent Labs  Lab 11/12/23 1923 11/13/23 0839  AST 24 20  ALT 16 13  ALKPHOS 68 55  BILITOT 0.4 0.9  PROT 8.0 5.8*  ALBUMIN  3.0* 2.5*   No results for  input(s): LIPASE, AMYLASE in the last 168 hours. No results for input(s): AMMONIA in the last 168 hours. Coagulation Profile:  Recent Labs  Lab 11/12/23 1923  INR 1.1   Unresulted Labs (From admission, onward)     Start     Ordered   11/19/23 0500  CBC  Every 48 hours,   R     Question:  Specimen collection method  Answer:  Lab=Lab collect   11/17/23 0830   11/19/23 0500  Basic metabolic panel  Every 48 hours,   R     Question:  Specimen collection method  Answer:  Lab=Lab collect   11/17/23 0830           Antimicrobials/Microbiology: Anti-infectives (From admission, onward)    Start     Dose/Rate Route Frequency Ordered Stop   11/18/23 1200  amoxicillin -clavulanate (AUGMENTIN ) 875-125 MG per tablet 1  tablet        1 tablet Oral Every 12 hours 11/18/23 0914     11/18/23 1000  doxycycline  (VIBRA -TABS) tablet 100 mg        100 mg Oral Every 12 hours 11/18/23 0914     11/16/23 1000  vancomycin  (VANCOREADY) IVPB 1250 mg/250 mL  Status:  Discontinued        1,250 mg 166.7 mL/hr over 90 Minutes Intravenous Every 12 hours 11/16/23 0140 11/18/23 0924   11/14/23 2100  piperacillin -tazobactam (ZOSYN ) IVPB 3.375 g  Status:  Discontinued       Placed in Followed by Linked Group   3.375 g 12.5 mL/hr over 240 Minutes Intravenous Every 8 hours 11/14/23 2037 11/18/23 0922   11/14/23 1100  vancomycin  (VANCOCIN ) 1,000 mg in sodium chloride  0.9 % 250 mL IVPB  Status:  Discontinued        1,000 mg 250 mL/hr over 60 Minutes Intravenous Every 12 hours 11/14/23 1040 11/16/23 0140   11/13/23 1000  vancomycin  (VANCOCIN ) IVPB 1000 mg/200 mL premix  Status:  Discontinued        1,000 mg 200 mL/hr over 60 Minutes Intravenous Every 12 hours 11/12/23 2047 11/14/23 1040   11/13/23 0400  piperacillin -tazobactam (ZOSYN ) IVPB 3.375 g  Status:  Discontinued       Placed in Followed by Linked Group   3.375 g 12.5 mL/hr over 240 Minutes Intravenous Every 8 hours 11/12/23 1950 11/14/23 2037    11/12/23 2000  piperacillin -tazobactam (ZOSYN ) IVPB 3.375 g       Placed in Followed by Linked Group   3.375 g 100 mL/hr over 30 Minutes Intravenous  Once 11/12/23 1950 11/12/23 2130   11/12/23 2000  vancomycin  (VANCOREADY) IVPB 1500 mg/300 mL        1,500 mg 150 mL/hr over 120 Minutes Intravenous  Once 11/12/23 1950 11/12/23 2350         Component Value Date/Time   SDES BLOOD LEFT ANTECUBITAL 11/12/2023 2114   SPECREQUEST  11/12/2023 2114    BOTTLES DRAWN AEROBIC AND ANAEROBIC Blood Culture adequate volume   CULT  11/12/2023 2114    NO GROWTH 5 DAYS Performed at [facility], [address], [place], [place] [address]    REPTSTATUS 11/17/2023 FINAL 11/12/2023 2114      Procedures:  Mennie LAMY, MD [facility] 11/19/2023, 9:53 AM

## 2023-11-20 DIAGNOSIS — L089 Local infection of the skin and subcutaneous tissue, unspecified: Secondary | ICD-10-CM | POA: Diagnosis not present

## 2023-11-20 DIAGNOSIS — T148XXA Other injury of unspecified body region, initial encounter: Secondary | ICD-10-CM | POA: Diagnosis not present

## 2023-11-20 NOTE — Progress Notes (Signed)
 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 7/18 to 7/28 right BKA with osteomyelitis and recent hospital admission for stump infection on 7/28 treated with IV vancomycin  and ceftriaxone -orthopedic Dr. Harden was consulted recommended continue IV antibiotic to complete 7 days followed by continue oral antibiotics for 7 days patient was discharged home with Augmentin  and doxycycline  Patient is stating that he has not been compliant with the antibiotic he has been prescribed and discharged with Patient was admitted for  sepsis secondary to right BKA stump infection. X-ray ruled out osteomyelitis. In the ED UDS positive with benzo, although benzos not listed in the meds, patient remained persistently hypertensive and was transferred to ICU 8/4.  CT head negative UA negative RVP negative.  Chest x-ray clear EKG sinus tachycardia Patient transferred to [facility] 8/5  Subjective: Patient seen and examined  Overnight afebrile BP stable Labs reviewed from 8/10 stable Resting comfortably, watching TV Dressing was changed yesterday  Assessment and plan:  Hypotension-multifactorial Initially suspected septic shock-no obvious source Right transtibial amputation with ischemic changes: Initially managed in ICU, vitals stabilized and transferred to TRH service Work up unyielding with:UA and chest x-ray with no evidence of infection.  X-ray stump- no osteomyelitis.  Blood culture on admission 8/3 no growth Dr. Harden input appreciated- felt to be  ischemic changes on right transtibial amputation-advised antibiotics, dressing changes Ischemic changes are improving, no surgery indicated at this time and okay for SNF placement once available IV antibiotics were changed to p.o. 8/9 complete 1 week course  Weaning off on midodrine    Left AKA status: Old.stable.  Hypokalemia: Resolved.  Hypotension History of hypertension: Stable continue midodrine   Hyperlipidemia: Continue statin.  Substance abuse Alcohol  abuse Anxiety/depression Homelessness: Continue supportive care.No signs of withdrawal.  Continue multivitamin thiamine . TOC consult for resources.  He is homeless and disposition will be difficult  Mobility: PT Orders: Active PT Follow up Rec: Long-Term Institutional Care Without Follow-Up Therapy8/10/2023 0941    DVT prophylaxis: rivaroxaban  (XARELTO ) tablet 10 mg Start: 11/18/23 1345 Code Status:   Code Status: Full Code Family Communication: plan of care discussed with patien at bedside. Patient status is: Remains hospitalized because of severity of illness Level of care: Telemetry Medical   Dispo: The patient is from: homeless            Anticipated disposition: Awaiting placement. Objective: Vitals last 24 hrs: Vitals:   11/19/23 1618 11/20/23 0425 11/20/23 0500 11/20/23 0826  BP: 120/75 109/61  112/78  Pulse:  86  79  Resp: 16 17    Temp: 98.1 F (36.7 C) 98 F (36.7 C)  97.9 F (36.6 C)  TempSrc: Oral     SpO2: 100% 97%  98%  Weight:   68 kg   Height:        Physical Examination: General exam: AAIX3, pleasant  HEENT:Oral mucosa moist, Ear/Nose WNL grossly Respiratory system: B/L Clear BS,no use of accessory muscle Cardiovascular system: S1 & S2 +, No JVD. Gastrointestinal system: Abdomen soft,NT,ND, BS+ Nervous System: Alert, awake, moving all extremities,and following commands. Extremities: Old left KA stump, right BKA foot with dressing  in place clean dry intact  Skin: No rashes,no  icterus. MSK: Normal muscle bulk,tone, power   Medications reviewed:  Scheduled Meds:  amoxicillin -clavulanate  1 tablet Oral Q12H   atorvastatin   40 mg Oral Daily   Chlorhexidine  Gluconate Cloth  6 each Topical Daily   doxycycline   100 mg Oral Q12H   folic acid   1 mg Oral Daily   gabapentin   100 mg Oral TID   Gerhardt's butt cream   Topical BID   leptospermum manuka honey  1 Application Topical Daily   melatonin  5 mg Oral QHS   midodrine   5 mg Oral TID WC   multivitamin with minerals  1 tablet Oral Daily   pantoprazole   40 mg Oral Daily   rivaroxaban   10 mg Oral Daily   sodium chloride  flush  3 mL Intravenous Q12H   sodium chloride  flush  3 mL Intravenous Q12H   thiamine   100 mg Oral Daily   Continuous Infusions:   Diet: Diet Order             Diet regular Room service appropriate? Yes; Fluid consistency: Thin  Diet effective now                   Data Reviewed: I have personally reviewed following labs and imaging studies ( see epic result tab) CBC: Recent Labs  Lab 11/15/23 0440 11/16/23 0734 11/17/23 0239 11/19/23 0603  WBC 7.3 6.7 6.3 6.9  HGB 10.8* 11.4* 10.6* 11.5*  HCT 33.5* 34.8* 33.4* 35.4*  MCV 86.6 86.4 88.6 87.2  PLT 409* 396 410* 375   CMP: Recent Labs  Lab 11/14/23 0437 11/15/23 0440 11/16/23 0734 11/17/23 0239 11/19/23 0603  NA 132* 135 134* 138 137  K 3.8 3.7 3.7 3.9 3.8  CL 100 102 102 104 103  CO2 24 24 23 25 24   GLUCOSE 98 105* 108* 135* 108*  BUN <5* <5* <5* <5* 6  CREATININE 0.54* 0.55* 0.54* 0.59* 0.56*  CALCIUM  8.0* 8.4* 8.6* 8.5* 8.9  MG 1.8 1.8 1.7 1.6*  --   PHOS 5.0* 5.6* 5.0* 4.4  --    GFR: Estimated Creatinine Clearance: 99.2 mL/min (A) (by C-G formula based on SCr of 0.56 mg/dL (L)). No results for input(s): AST, ALT, ALKPHOS, BILITOT, PROT, ALBUMIN  in the last 168 hours.  No results for input(s): LIPASE, AMYLASE in the last 168 hours. No results for input(s): AMMONIA in the last 168  hours. Coagulation Profile:  No results for input(s): INR, PROTIME in the last 168 hours.  Unresulted Labs (From admission, onward)     Start     Ordered   11/19/23 0500  CBC  Every 48 hours,   R     Question:  Specimen collection method  Answer:  Lab=Lab collect   11/17/23 0830   11/19/23 0500  Basic metabolic panel  Every 48 hours,   R     Question:  Specimen collection method  Answer:  Lab=Lab collect   11/17/23 0830           Antimicrobials/Microbiology: Anti-infectives (From admission, onward)    Start     Dose/Rate Route Frequency Ordered Stop   11/18/23 1200  amoxicillin -clavulanate (AUGMENTIN ) 875-125 MG per tablet 1 tablet        1 tablet Oral Every 12 hours 11/18/23 0914     11/18/23 1000  doxycycline  (VIBRA -TABS) tablet 100 mg        100 mg Oral Every 12 hours 11/18/23 0914  11/16/23 1000  vancomycin  (VANCOREADY) IVPB 1250 mg/250 mL  Status:  Discontinued        1,250 mg 166.7 mL/hr over 90 Minutes Intravenous Every 12 hours 11/16/23 0140 11/18/23 0924   11/14/23 2100  piperacillin -tazobactam (ZOSYN ) IVPB 3.375 g  Status:  Discontinued       Placed in Followed by Linked Group   3.375 g 12.5 mL/hr over 240 Minutes Intravenous Every 8 hours 11/14/23 2037 11/18/23 0922   11/14/23 1100  vancomycin  (VANCOCIN ) 1,000 mg in sodium chloride  0.9 % 250 mL IVPB  Status:  Discontinued        1,000 mg 250 mL/hr over 60 Minutes Intravenous Every 12 hours 11/14/23 1040 11/16/23 0140   11/13/23 1000  vancomycin  (VANCOCIN ) IVPB 1000 mg/200 mL premix  Status:  Discontinued        1,000 mg 200 mL/hr over 60 Minutes Intravenous Every 12 hours 11/12/23 2047 11/14/23 1040   11/13/23 0400  piperacillin -tazobactam (ZOSYN ) IVPB 3.375 g  Status:  Discontinued       Placed in Followed by Linked Group   3.375 g 12.5 mL/hr over 240 Minutes Intravenous Every 8 hours 11/12/23 1950 11/14/23 2037   11/12/23 2000  piperacillin -tazobactam (ZOSYN ) IVPB 3.375 g       Placed in Followed  by Linked Group   3.375 g 100 mL/hr over 30 Minutes Intravenous  Once 11/12/23 1950 11/12/23 2130   11/12/23 2000  vancomycin  (VANCOREADY) IVPB 1500 mg/300 mL        1,500 mg 150 mL/hr over 120 Minutes Intravenous  Once 11/12/23 1950 11/12/23 2350         Component Value Date/Time   SDES BLOOD LEFT ANTECUBITAL 11/12/2023 2114   SPECREQUEST  11/12/2023 2114    BOTTLES DRAWN AEROBIC AND ANAEROBIC Blood Culture adequate volume   CULT  11/12/2023 2114    NO GROWTH 5 DAYS Performed at [facility], [address], [place], [place] [address]    REPTSTATUS 11/17/2023 FINAL 11/12/2023 2114    Procedures:  Mennie LAMY, MD [facility] 11/20/2023, 9:19 AM

## 2023-11-20 NOTE — Progress Notes (Signed)
 Physical Therapy Treatment Patient Details Name: Jake Nguyen MRN: [id number] DOB: [birth year] Today's Date: 11/20/2023   History of Present Illness Pt is a 58 y/o male admitted to [facility] on 8/3 for fall, found to be hypotensive and tachycardic. Pt recently admitted on 7/28 for R BKA infection. PMH: etoh use, tobacco use, anxiety, depression, L AKA, R BKA    PT Comments  Pt supine in bed on entry, agreeable to therapy but states I can't hold my R leg up and get into chair. PT agreeable to bed level exercises and starts with SLR but pt only does a few before he says.  Just let me get to the chair Pt is able to transfer from bed to chair with R LE out straight and not using end of residual limb to push off. Once pt up in chair. PT educated that he can leave his R LE on bed and use his UE to scoot back just as long as he does not dig his R residual limb into bed to assist with transfer. Pt states  Why didn't you tell me that to begin with Will continue to work with pt to transfer to his wheelchair, which pt states he does not want to get up to. D/c plans remain appropriate at this time. PT will continue to follow acutely.        Can travel by private vehicle     No  Equipment Recommendations  Wheelchair cushion (measurements PT)       Precautions / Restrictions Precautions Precautions: Fall Recall of Precautions/Restrictions: Impaired Precaution/Restrictions Comments: R BKA wound Restrictions Weight Bearing Restrictions Per Provider Order: Yes RLE Weight Bearing Per Provider Order: Non weight bearing Other Position/Activity Restrictions: R BKA, L AKA     Mobility  Bed Mobility Overal bed mobility: Needs Assistance Bed Mobility: Supine to Sit     Supine to sit: Modified independent (Device/Increase time)     General bed mobility comments: minimal use of bed rails to come to long sitting in the bed    Transfers Overall transfer level: Needs assistance Equipment used:  None Transfers: Bed to chair/wheelchair/BSC         Anterior-Posterior transfers: Supervision   General transfer comment: pt initially upset with PT, stating that he can't hold his R LE up in the air and transfer, he just can't do it. pt able to transfer to recliner with RLE out in front of him but not bearing weight through it. PT educated pt that he just can not dig his R LE into bed to push off from.    Ambulation/Gait               General Gait Details: non-ambulatory         Balance Overall balance assessment: Needs assistance Sitting-balance support: No upper extremity supported Sitting balance-Leahy Scale: Good Sitting balance - Comments: long sitting in bed.                                    Communication Communication Communication: No apparent difficulties  Cognition Arousal: Alert Behavior During Therapy: WFL for tasks assessed/performed   PT - Cognitive impairments: No apparent impairments                       PT - Cognition Comments: poor correlation between non healing wound on L LE and use of residual limb to push through  for transfers Following commands: Intact      Cueing Cueing Techniques: Verbal cues  Exercises General Exercises - Lower Extremity Straight Leg Raises: AROM, Both, 10 reps, Seated    General Comments General comments (skin integrity, edema, etc.): R BKE wrapped in ACE bandage which is clean dry and in tact, VSS on RA      Pertinent Vitals/Pain Pain Assessment Pain Assessment: Faces Faces Pain Scale: Hurts a little bit Pain Location: R AKA Pain Descriptors / Indicators: Discomfort, Guarding, Aching Pain Intervention(s): Limited activity within patient's tolerance     PT Goals (current goals can now be found in the care plan section) Acute Rehab PT Goals Patient Stated Goal: not stated PT Goal Formulation: With patient Time For Goal Achievement: 11/30/23 Potential to Achieve Goals:  Fair Progress towards PT goals: Progressing toward goals    Frequency    Min 2X/week       AM-PAC PT 6 Clicks Mobility   Outcome Measure  Help needed turning from your back to your side while in a flat bed without using bedrails?: A Little Help needed moving from lying on your back to sitting on the side of a flat bed without using bedrails?: A Little Help needed moving to and from a bed to a chair (including a wheelchair)?: A Little Help needed standing up from a chair using your arms (e.g., wheelchair or bedside chair)?: Total Help needed to walk in hospital room?: Total Help needed climbing 3-5 steps with a railing? : Total 6 Click Score: 12    End of Session   Activity Tolerance: Patient tolerated treatment well Patient left: with call bell/phone within reach;in chair;with chair alarm set;Other (comment) (request to change pt's bed linens) Nurse Communication: Mobility status PT Visit Diagnosis: Other abnormalities of gait and mobility (R26.89)     Time: 8368-8353 PT Time Calculation (min) (ACUTE ONLY): 15 min  Charges:    $Therapeutic Activity: 8-22 mins PT General Charges $$ ACUTE PT VISIT: 1 Visit                     Hassan Blackshire B. Fleeta Lapidus PT, DPT Acute Rehabilitation Services Please use secure chat or  Call Office [phone]    [name] 11/20/2023, 4:56 PM

## 2023-11-21 DIAGNOSIS — L089 Local infection of the skin and subcutaneous tissue, unspecified: Secondary | ICD-10-CM | POA: Diagnosis not present

## 2023-11-21 DIAGNOSIS — T148XXA Other injury of unspecified body region, initial encounter: Secondary | ICD-10-CM | POA: Diagnosis not present

## 2023-11-21 LAB — BASIC METABOLIC PANEL WITH GFR
Anion gap: 10 (ref 5–15)
BUN: 6 mg/dL (ref 6–20)
CO2: 26 mmol/L (ref 22–32)
Calcium: 9 mg/dL (ref 8.9–10.3)
Chloride: 98 mmol/L (ref 98–111)
Creatinine, Ser: 0.6 mg/dL — ABNORMAL LOW (ref 0.61–1.24)
GFR, Estimated: >60 mL/min (ref >60)
Glucose, Bld: 126 mg/dL — ABNORMAL HIGH (ref 70–99)
Potassium: 3.7 mmol/L (ref 3.5–5.1)
Sodium: 134 mmol/L — ABNORMAL LOW (ref 135–145)

## 2023-11-21 LAB — CBC
HCT: 39.2 % (ref 39.0–52.0)
Hemoglobin: 12.3 g/dL — ABNORMAL LOW (ref 13.0–17.0)
MCH: 27.9 pg (ref 26.0–34.0)
MCHC: 31.4 g/dL (ref 30.0–36.0)
MCV: 88.9 fL (ref 80.0–100.0)
Platelets: 400 K/uL (ref 150–400)
RBC: 4.41 MIL/uL (ref 4.22–5.81)
RDW: 14 % (ref 11.5–15.5)
WBC: 7.3 K/uL (ref 4.0–10.5)
nRBC: 0 % (ref 0.0–0.2)

## 2023-11-21 MED ORDER — MIDODRINE HCL 5 MG PO TABS
2.5000 mg | ORAL_TABLET | Freq: Three times a day (TID) | ORAL | Status: DC
  Administered 2023-11-21 – 2023-11-22 (×8): 2.5 mg via ORAL
  Filled 2023-11-21 (×4): qty 1

## 2023-11-21 NOTE — TOC Progression Note (Addendum)
 Transition of Care [facility]) - Progression Note    Patient Details  Name: Jake Nguyen MRN: [id number] Date of Birth: [birth year]  Transition of [facility]) CM/SW Contact  Frankie Scipio A [place], LCSW Phone Number: 11/21/2023, 3:42 PM  Clinical Narrative:     CSW met with pt at bedside to discuss his disposition plan. Asked if pt would be interested in SNF at [place], he said that he doesn't want them to take his money  and declined the referral. Pt did not have any bed offers pending at this time.   He said he did that before and only access $30 worth of funds. Requested to go back to [facility] at discharge, as he receives his mail from there. Requested if going to [place] in coming days, needs to be at the [facility] before 3pm.   Patient lost his wallet with license and social security care, CSW provided picture of ID from pt's chart and gave him contact info for [employer] security office to inquire about steps for getting a new SS card.   CSW to assist with safe transport at discharge.   CSW will continue to follow.                   Expected Discharge Plan and Services                                               Social Drivers of Health (SDOH) Interventions SDOH Screenings   Food Insecurity: Food Insecurity Present (11/13/2023)  Housing: High Risk (11/13/2023)  Transportation Needs: Unmet Transportation Needs (11/13/2023)  Utilities: Patient Declined (11/13/2023)  Alcohol  Screen: Medium Risk (11/25/2020)  Depression (PHQ2-9): Low Risk  (11/06/2019)  Social Connections: Socially Isolated (07/20/2023)  Tobacco Use: High Risk (11/12/2023)    Readmission Risk Interventions    11/15/2023   11:58 AM  Readmission Risk Prevention Plan  Transportation Screening Complete  Medication Review (RN Care Manager) Complete  PCP or Specialist appointment within 3-5 days of discharge Complete  HRI or Home Care Consult Complete  SW Recovery Care/Counseling Consult Complete  Palliative Care Screening Not  Applicable  Skilled Nursing Facility Complete

## 2023-11-21 NOTE — Progress Notes (Signed)
 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 7/18 to 7/28 right BKA with osteomyelitis and recent hospital admission for stump infection on 7/28 treated with IV vancomycin  and ceftriaxone -orthopedic Dr. Harden was consulted recommended continue IV antibiotic to complete 7 days followed by continue oral antibiotics for 7 days patient was discharged home with Augmentin  and doxycycline  Patient is stating that he has not been compliant with the antibiotic he has been prescribed and discharged with Patient was admitted for  sepsis secondary to right BKA stump infection. X-ray ruled out osteomyelitis. In the ED UDS positive with benzo, although benzos not listed in the meds, patient remained persistently hypertensive and was transferred to ICU 8/4.  CT head negative UA negative RVP negative.  Chest x-ray clear EKG sinus tachycardia Patient transferred to [facility] 8/5> managed with dressing changes, IV antibiotics subsequently changed to p.o. At this time waiting for SNF placement  Subjective: Patient seen and examined  Has no complaint, overnight afebrile BP stable, on room air Last labs reviewed from 8/10   Assessment and plan:  Hypotension-multifactorial Initially suspected septic shock-no obvious source Right transtibial amputation with ischemic changes: Initially managed in ICU, vitals stabilized and transferred to TRH service Work up unyielding with:UA and chest x-ray with no evidence of infection.X-ray stump- no osteomyelitis.Blood  culture 8/3 NGTD Dr. Harden input appreciated- felt to be ischemic changes on right transtibial amputation-advised antibiotics, dressing changes Ischemic changes are improving, no surgery indicated  AND okay for SNF placement once available IV antibiotics were changed to p.o. 8/9 complete 1 week course  Weaning off on midodrine > changed to 2.5 mg tid 8/12-hopefully can stop soon discussed with nursing to upload picture today during dressing change.  Left AKA status: Old.stable.  Hypokalemia: Resolved.  Hypotension History of hypertension: Stable continue midodrine   Hyperlipidemia: Continue statin.  Substance abuse Alcohol  abuse Anxiety/depression Homelessness: Continue supportive care.No signs of withdrawal.  Continue multivitamin thiamine . TOC consult for resources.  He is homeless and disposition will be difficult  Mobility: PT Orders: Active PT Follow up Rec: Long-Term Institutional Care Without Follow-Up Therapy8/02/2024 1600    DVT prophylaxis: rivaroxaban  (XARELTO ) tablet 10 mg Start: 11/18/23 1345 Code Status:   Code Status: Full Code Family Communication: plan of care discussed with patien at bedside. Patient status is: Remains hospitalized because of severity of illness Level of care: Telemetry Medical   Dispo: The patient is from: homeless            Anticipated disposition: Awaiting placement. Objective: Vitals last 24 hrs: Vitals:   11/20/23 1956 11/21/23 0000 11/21/23 0436 11/21/23 0500  BP: 122/75 118/72 106/65   Pulse: 68 86 71   Resp: 18 17 18    Temp: 98.3 F (36.8 C) 98.1 F (36.7 C) 98.1 F (36.7 C)   TempSrc: Oral Oral    SpO2: 100%  99%   Weight:    67.4 kg  Height:        Physical Examination: General exam: AAOX3, pleasant  HEENT:Oral mucosa moist, Ear/Nose WNL grossly Respiratory system: B/L Clear BS,no use of accessory muscle Cardiovascular system: S1 & S2 +, No  JVD. Gastrointestinal system: Abdomen soft,NT,ND, BS+ Nervous System:  Alert awake nonfocal Extremities: Old left AKA stump, Rt BKA-dressing in place  c/d/I- Skin: No rashes,no icterus. MSK: Normal muscle bulk,tone, power   Medications reviewed:  Scheduled Meds:  amoxicillin -clavulanate  1 tablet Oral Q12H   atorvastatin   40 mg Oral Daily   Chlorhexidine  Gluconate Cloth  6 each Topical Daily   doxycycline   100 mg Oral Q12H   folic acid   1 mg Oral Daily   gabapentin   100 mg Oral TID   Gerhardt's butt cream   Topical BID   leptospermum manuka honey  1 Application Topical Daily   melatonin  5 mg Oral QHS   midodrine   2.5 mg Oral TID WC   multivitamin with minerals  1 tablet Oral Daily   pantoprazole   40 mg Oral Daily   rivaroxaban   10 mg Oral Daily   sodium chloride  flush  3 mL Intravenous Q12H   sodium chloride  flush  3 mL Intravenous Q12H   thiamine   100 mg Oral Daily   Continuous Infusions:   Diet: Diet Order             Diet regular Room service appropriate? Yes; Fluid consistency: Thin  Diet effective now                   Data Reviewed: I have personally reviewed following labs and imaging studies ( see epic result tab) CBC: Recent Labs  Lab 11/15/23 0440 11/16/23 0734 11/17/23 0239 11/19/23 0603  WBC 7.3 6.7 6.3 6.9  HGB 10.8* 11.4* 10.6* 11.5*  HCT 33.5* 34.8* 33.4* 35.4*  MCV 86.6 86.4 88.6 87.2  PLT 409* 396 410* 375   CMP: Recent Labs  Lab 11/15/23 0440 11/16/23 0734 11/17/23 0239 11/19/23 0603  NA 135 134* 138 137  K 3.7 3.7 3.9 3.8  CL 102 102 104 103  CO2 24 23 25 24   GLUCOSE 105* 108* 135* 108*  BUN <5* <5* <5* 6  CREATININE 0.55* 0.54* 0.59* 0.56*  CALCIUM  8.4* 8.6* 8.5* 8.9  MG 1.8 1.7 1.6*  --   PHOS 5.6* 5.0* 4.4  --    GFR: Estimated Creatinine Clearance: 98.3 mL/min (A) (by C-G formula based on SCr of 0.56 mg/dL (L)). No results for input(s): AST, ALT, ALKPHOS, BILITOT, PROT, ALBUMIN  in the last 168 hours.  No results for input(s): LIPASE, AMYLASE in the last 168 hours. No results  for input(s): AMMONIA in the last 168 hours. Coagulation Profile:  No results for input(s): INR, PROTIME in the last 168 hours.  Unresulted Labs (From admission, onward)     Start     Ordered   11/19/23 0500  CBC  Every 48 hours,   R (with TIMED occurrences)     Question:  Specimen collection method  Answer:  Lab=Lab collect   11/17/23 0830   11/19/23 0500  Basic metabolic panel  Every 48 hours,   R (with TIMED occurrences)     Question:  Specimen collection method  Answer:  Lab=Lab collect   11/17/23 0830           Antimicrobials/Microbiology: Anti-infectives (From admission, onward)    Start     Dose/Rate Route Frequency Ordered Stop   11/18/23 1200  amoxicillin -clavulanate (AUGMENTIN ) 875-125 MG per tablet 1 tablet        1 tablet Oral Every 12 hours 11/18/23 0914 11/25/23 0959   11/18/23 1000  doxycycline  (VIBRA -TABS) tablet 100 mg  100 mg Oral Every 12 hours 11/18/23 0914     11/16/23 1000  vancomycin  (VANCOREADY) IVPB 1250 mg/250 mL  Status:  Discontinued        1,250 mg 166.7 mL/hr over 90 Minutes Intravenous Every 12 hours 11/16/23 0140 11/18/23 0924   11/14/23 2100  piperacillin -tazobactam (ZOSYN ) IVPB 3.375 g  Status:  Discontinued       Placed in Followed by Linked Group   3.375 g 12.5 mL/hr over 240 Minutes Intravenous Every 8 hours 11/14/23 2037 11/18/23 0922   11/14/23 1100  vancomycin  (VANCOCIN ) 1,000 mg in sodium chloride  0.9 % 250 mL IVPB  Status:  Discontinued        1,000 mg 250 mL/hr over 60 Minutes Intravenous Every 12 hours 11/14/23 1040 11/16/23 0140   11/13/23 1000  vancomycin  (VANCOCIN ) IVPB 1000 mg/200 mL premix  Status:  Discontinued        1,000 mg 200 mL/hr over 60 Minutes Intravenous Every 12 hours 11/12/23 2047 11/14/23 1040   11/13/23 0400  piperacillin -tazobactam (ZOSYN ) IVPB 3.375 g  Status:  Discontinued       Placed in Followed by Linked Group   3.375 g 12.5 mL/hr over 240 Minutes Intravenous Every 8 hours 11/12/23 1950  11/14/23 2037   11/12/23 2000  piperacillin -tazobactam (ZOSYN ) IVPB 3.375 g       Placed in Followed by Linked Group   3.375 g 100 mL/hr over 30 Minutes Intravenous  Once 11/12/23 1950 11/12/23 2130   11/12/23 2000  vancomycin  (VANCOREADY) IVPB 1500 mg/300 mL        1,500 mg 150 mL/hr over 120 Minutes Intravenous  Once 11/12/23 1950 11/12/23 2350         Component Value Date/Time   SDES BLOOD LEFT ANTECUBITAL 11/12/2023 2114   SPECREQUEST  11/12/2023 2114    BOTTLES DRAWN AEROBIC AND ANAEROBIC Blood Culture adequate volume   CULT  11/12/2023 2114    NO GROWTH 5 DAYS Performed at [facility], [address], [place], [place] [address]    REPTSTATUS 11/17/2023 FINAL 11/12/2023 2114    Procedures:  Mennie LAMY, MD [facility] 11/21/2023, 7:39 AM

## 2023-11-21 NOTE — Plan of Care (Signed)
  Problem: Clinical Measurements: Goal: Will remain free from infection Outcome: Progressing   Problem: Nutrition: Goal: Adequate nutrition will be maintained Outcome: Progressing   Problem: Elimination: Goal: Will not experience complications related to urinary retention Outcome: Progressing   Problem: Pain Managment: Goal: General experience of comfort will improve and/or be controlled Outcome: Progressing Note: No complaints of pain during this shift.   Problem: Safety: Goal: Ability to remain free from injury will improve Outcome: Progressing   Problem: Skin Integrity: Goal: Risk for impaired skin integrity will decrease Outcome: Progressing Note: Dressing change was performed on the R stump. Patient declined dressing change on his sacrum, refused for this RN to check his behind.

## 2023-11-22 ENCOUNTER — Other Ambulatory Visit (HOSPITAL_COMMUNITY): Payer: Self-pay

## 2023-11-22 DIAGNOSIS — L089 Local infection of the skin and subcutaneous tissue, unspecified: Secondary | ICD-10-CM | POA: Diagnosis not present

## 2023-11-22 DIAGNOSIS — T148XXA Other injury of unspecified body region, initial encounter: Secondary | ICD-10-CM | POA: Diagnosis not present

## 2023-11-22 MED ORDER — TRAMADOL HCL 50 MG PO TABS
50.0000 mg | ORAL_TABLET | Freq: Three times a day (TID) | ORAL | 0 refills | Status: DC | PRN
  Filled 2023-11-22: qty 18, 5d supply, fill #0

## 2023-11-22 MED ORDER — DOXYCYCLINE HYCLATE 100 MG PO TABS
100.0000 mg | ORAL_TABLET | Freq: Two times a day (BID) | ORAL | 0 refills | Status: AC
  Filled 2023-11-22 (×2): qty 8, 4d supply, fill #0

## 2023-11-22 MED ORDER — ADULT MULTIVITAMIN W/MINERALS CH
1.0000 | ORAL_TABLET | Freq: Every day | ORAL | 0 refills | Status: DC
  Filled 2023-11-22: qty 30, 30d supply, fill #0

## 2023-11-22 MED ORDER — MIDODRINE HCL 2.5 MG PO TABS
2.5000 mg | ORAL_TABLET | Freq: Three times a day (TID) | ORAL | 0 refills | Status: DC
  Filled 2023-11-22 (×2): qty 90, 30d supply, fill #0

## 2023-11-22 MED ORDER — AMOXICILLIN-POT CLAVULANATE 875-125 MG PO TABS
1.0000 | ORAL_TABLET | Freq: Two times a day (BID) | ORAL | 0 refills | Status: AC
  Filled 2023-11-22: qty 8, 4d supply, fill #0

## 2023-11-22 NOTE — Plan of Care (Signed)

## 2023-11-22 NOTE — TOC Progression Note (Signed)
 Transition of Care [facility]) - Progression Note    Patient Details  Name: Jake Nguyen MRN: [id number] Date of Birth: 09-Aug-1965  Transition of Care [facility]) CM/SW Contact  Refugio Mcconico A [place], LCSW Phone Number: 11/22/2023, 2:28 PM  Clinical Narrative:     CSW met with pt at bedside. Continues to be agreeable to return to [facility] at discharge. Safe transport set up for pt's DC, liability waiver signed.   Pt has PCP follow up scheduled, on cancellation list in attempt pt can get an earlier appointment.    No other Inpatient care management needs.                      Expected Discharge Plan and Services         Expected Discharge Date: 11/22/23                                     Social Drivers of Health (SDOH) Interventions SDOH Screenings   Food Insecurity: Food Insecurity Present (11/13/2023)  Housing: High Risk (11/13/2023)  Transportation Needs: Unmet Transportation Needs (11/13/2023)  Utilities: Patient Declined (11/13/2023)  Alcohol  Screen: Medium Risk (11/25/2020)  Depression (PHQ2-9): Low Risk  (11/06/2019)  Social Connections: Socially Isolated (07/20/2023)  Tobacco Use: High Risk (11/12/2023)    Readmission Risk Interventions    11/15/2023   11:58 AM  Readmission Risk Prevention Plan  Transportation Screening Complete  Medication Review (RN Care Manager) Complete  PCP or Specialist appointment within 3-5 days of discharge Complete  HRI or Home Care Consult Complete  SW Recovery Care/Counseling Consult Complete  Palliative Care Screening Not Applicable  Skilled Nursing Facility Complete

## 2023-11-22 NOTE — Discharge Summary (Signed)
 Physician Discharge Summary  Jake Nguyen FMW:[id number] DOB: [birth year] DOA: 11/12/2023  PCP: Pcp, No  Admit date: 11/12/2023 Discharge date: 11/22/2023 Recommendations for Outpatient Follow-up:  Follow up with PCP in 1 weeks-call for appointment Please obtain BMP/CBC in one week  Discharge Dispo: irc Discharge Condition: Stable Code Status:   Code Status: Full Code Diet recommendation:  Diet Order             Diet general           Diet regular Room service appropriate? 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 7/18 to 7/28 right BKA with osteomyelitis and recent hospital admission for stump infection on 7/28 treated with IV vancomycin  and ceftriaxone -orthopedic Dr. Harden was consulted recommended continue IV antibiotic to complete 7 days followed by continue oral antibiotics for 7 days patient was discharged home with Augmentin  and doxycycline  Patient is stating that he has not been compliant with the antibiotic he has been prescribed and discharged with Patient was admitted for  sepsis secondary to right BKA stump infection. X-ray ruled out osteomyelitis. In the ED UDS positive with benzo, although benzos not listed in the meds, patient remained persistently hypertensive and was transferred to ICU 8/4.  CT head negative UA negative RVP negative.  Chest x-ray clear EKG sinus tachycardia Patient transferred to [facility] 8/5> managed with dressing changes, IV antibiotics subsequently changed to p.o. At this time waiting for SNF placement Patient at  this time refused to go to SNF and wants to return to [facility] Discussed with Dr. Harden okay to discharge home on wound care supplies and instruction and oral antibiotics for few more days He will follow-up with Dr. Harden office and PCP   Subjective: Seen and examined Aaox3 no new complaints Overnight afebrile BP 90s-100 asymptomatic.  Labs reviewed remains unchanged 8/12   Discharge diagnosis:  Hypotension-multifactorial Initially suspected septic shock-no obvious source Right transtibial amputation with ischemic changes: Initially managed in ICU, vitals stabilized and transferred to [facility] service Work up unyielding with:UA and chest x-ray with no evidence of infection.X-ray stump- no osteomyelitis.Blood culture 8/3 NGTD Dr. Harden input appreciated- felt to be ischemic changes on right transtibial amputation-advised antibiotics, dressing changes Ischemic changes are improving, no surgery indicated  AND okay for SNF placement once available IV antibiotics were changed to p.o. 8/9 complete 1 week course-only few days remaining Weaning off on midodrine > changed to 2.5 mg tid 8/12-will continue on that for now  As per Dr. Harden okay to discharge as patient does not go to SNF and he will return to [facility] with wound care supplies which has been provided  Left AKA status: Old.stable.  Hypokalemia: Resolved.  Hypotension History of hypertension: Stable continue midodrine   Hyperlipidemia: Continue statin.  Substance abuse Alcohol  abuse Anxiety/depression Homelessness: Continue supportive care.No signs of withdrawal.  Continue multivitamin thiamine . TOC consult for resources.  He is homeless and disposition will be difficult  Mobility: PT Orders: Active PT Follow up Rec: Long-Term Institutional Care Without Follow-Up Therapy8/02/2024 1600    DVT prophylaxis: rivaroxaban  (  XARELTO ) tablet 10 mg Start: 11/18/23 1345 Code Status:   Code Status: Full Code Family Communication: plan of care  discussed with patien at bedside. Patient status is: Remains hospitalized because of severity of illness Level of care: Telemetry Medical   Dispo: The patient is from: homeless            Anticipated disposition: IRC Objective: Vitals last 24 hrs: Vitals:   11/22/23 0024 11/22/23 0432 11/22/23 0436 11/22/23 0813  BP: 98/72  97/60 106/71  Pulse: 92  84 89  Resp: 18  18 17   Temp: 98.5 F (36.9 C)  98.4 F (36.9 C) 98.2 F (36.8 C)  TempSrc:      SpO2: 95%  96% 100%  Weight:  66.8 kg    Height:        Physical Examination: General exam: AAOX3, pleasant  HEENT:Oral mucosa moist, Ear/Nose WNL grossly Respiratory system: B/L Clear BS,no use of accessory muscle Cardiovascular system: S1 & S2 +, No JVD. Gastrointestinal system: Abdomen soft,NT,ND, BS+ Nervous System: Alert awake nonfocal Extremities: Old left AKA stump, Rt BKA-dressing in place  c/d/I- Skin: No rashes,no icterus. MSK: Normal muscle bulk,tone, power   Medications reviewed:  Scheduled Meds:  amoxicillin -clavulanate  1 tablet Oral Q12H   atorvastatin   40 mg Oral Daily   Chlorhexidine  Gluconate Cloth  6 each Topical Daily   doxycycline   100 mg Oral Q12H   folic acid   1 mg Oral Daily   gabapentin   100 mg Oral TID   Gerhardt's butt cream   Topical BID   leptospermum manuka honey  1 Application Topical Daily   melatonin  5 mg Oral QHS   midodrine   2.5 mg Oral TID WC   multivitamin with minerals  1 tablet Oral Daily   pantoprazole   40 mg Oral Daily   rivaroxaban   10 mg Oral Daily   sodium chloride  flush  3 mL Intravenous Q12H   sodium chloride  flush  3 mL Intravenous Q12H   thiamine   100 mg Oral Daily   Continuous Infusions:   Diet: Diet Order             Diet general           Diet regular Room service appropriate? Yes; Fluid consistency: Thin  Diet effective now                      Consultation: See note.  Discharge Instructions  Discharge Instructions     Change dressing   Complete  by: As directed    Apply 4 x 4 gauze dampened with Vashe to the ulcerative area of the amputated limb.  Secure with ace wrap.  Change daily.   Diet general   Complete by: As directed    Discharge instructions   Complete by: As directed    Please call call MD or return to ER for similar or worsening recurring problem that brought you to hospital or if any fever,nausea/vomiting,abdominal pain, uncontrolled pain, chest pain,  shortness of breath or any other alarming symptoms.  Continue the regular dressing changes as instructed, follow-up with Dr. Harden clinic  Please follow-up your doctor as instructed in a week time and call the office for appointment.  Please avoid alcohol , smoking, or any other illicit substance and maintain healthy habits including taking your regular medications as prescribed.  You were cared for by a hospitalist during your hospital stay. If you have any questions about your discharge medications or the care you  received while you were in the hospital after you are discharged, you can call the unit and ask to speak with the hospitalist on call if the hospitalist that took care of you is not available.  Once you are discharged, your primary care physician will handle any further medical issues. Please note that NO REFILLS for any discharge medications will be authorized once you are discharged, as it is imperative that you return to your primary care physician (or establish a relationship with a primary care physician if you do not have one) for your aftercare needs so that they can reassess your need for medications and monitor your lab values   Discharge wound care:   Complete by: As directed    Continue with dressing with 4 x 4 gauze dampened with Vashe plus Ace wrap change daily.   Increase activity slowly   Complete by: As directed       Allergies as of 11/22/2023       Reactions   Pork-derived Products Other (See Comments)   Pt states he doesn't eat pork         Medication List     STOP taking these medications    folic acid  1 MG tablet Commonly known as: FOLVITE    gabapentin  100 MG capsule Commonly known as: NEURONTIN    pantoprazole  40 MG tablet Commonly known as: PROTONIX    thiamine  100 MG tablet Commonly known as: VITAMIN B1       TAKE these medications    acetaminophen  325 MG tablet Commonly known as: TYLENOL  Take 2 tablets (650 mg total) by mouth every 6 (six) hours as needed for mild pain (pain score 1-3) or fever (or Fever >/= 101).   amoxicillin -clavulanate 875-125 MG tablet Commonly known as: AUGMENTIN  Take 1 tablet by mouth every 12 (twelve) hours for 4 days.   atorvastatin  40 MG tablet Commonly known as: LIPITOR Take 1 tablet (40 mg total) by mouth daily.   doxycycline  100 MG tablet Commonly known as: VIBRA -TABS Take 1 tablet (100 mg total) by mouth every 12 (twelve) hours for 4 days.   midodrine  2.5 MG tablet Commonly known as: PROAMATINE  Take 1 tablet (2.5 mg total) by mouth 3 (three) times daily with meals.   multivitamin with minerals Tabs tablet Take 1 tablet by mouth daily.               Discharge Care Instructions  (From admission, onward)           Start     Ordered   11/22/23 0000  Change dressing       Comments: Apply 4 x 4 gauze dampened with Vashe to the ulcerative area of the amputated limb.  Secure with ace wrap.  Change daily.   11/22/23 1123   11/22/23 0000  Discharge wound care:       Comments: Continue with dressing with 4 x 4 gauze dampened with Vashe plus Ace wrap change daily.   11/22/23 1130            Follow-up Information     Harden Jerona GAILS, MD Follow up in 1 week(s).   Specialty: Orthopedic Surgery Contact information: [address]  [place] [address] [phone]         [facility] Follow up in 1 week(s).   Contact information: [address] [place]   [address] [phone]               Allergies  Allergen Reactions   Pork-Derived Products Other (See Comments)    Pt states he doesn't eat pork    The results of significant diagnostics from this hospitalization (including imaging, microbiology, ancillary and laboratory) are listed below for reference.    Microbiology: Recent Results (from the past 240 hours)  Blood Culture (routine x 2)     Status: None   Collection Time: 11/12/23  8:10 PM   Specimen: BLOOD  Result Value Ref Range Status   Specimen Description BLOOD SITE NOT SPECIFIED  Final   Special Requests   Final    BOTTLES DRAWN AEROBIC AND ANAEROBIC Blood Culture adequate volume   Culture   Final    NO GROWTH 5 DAYS Performed at [facility], [address], [place], [place] [address]    Report Status 11/17/2023 FINAL  Final  Blood Culture (routine x 2)     Status: None   Collection Time: 11/12/23  9:14 PM   Specimen: BLOOD  Result Value Ref Range Status   Specimen Description BLOOD LEFT ANTECUBITAL  Final   Special Requests   Final    BOTTLES DRAWN AEROBIC AND ANAEROBIC Blood Culture adequate volume   Culture   Final    NO GROWTH 5 DAYS Performed at [facility], [address] [place], [place] [address]    Report Status 11/17/2023 FINAL  Final  Respiratory (~20 pathogens) panel by PCR     Status: None   Collection Time: 11/13/23  1:58 AM   Specimen: Nasopharyngeal Swab; Respiratory  Result Value Ref Range Status   Adenovirus NOT DETECTED NOT DETECTED Final   Coronavirus 229E NOT DETECTED NOT DETECTED Final    Comment: (NOTE) The Coronavirus on the Respiratory Panel, DOES NOT test for the novel  Coronavirus (2019 nCoV)    Coronavirus HKU1 NOT DETECTED NOT DETECTED Final   Coronavirus NL63 NOT DETECTED NOT DETECTED Final   Coronavirus OC43 NOT DETECTED NOT DETECTED Final   Metapneumovirus NOT DETECTED NOT DETECTED Final   Rhinovirus / Enterovirus NOT DETECTED NOT DETECTED Final    Influenza A NOT DETECTED NOT DETECTED Final   Influenza B NOT DETECTED NOT DETECTED Final   Parainfluenza Virus 1 NOT DETECTED NOT DETECTED Final   Parainfluenza Virus 2 NOT DETECTED NOT DETECTED Final   Parainfluenza Virus 3 NOT DETECTED NOT DETECTED Final   Parainfluenza Virus 4 NOT DETECTED NOT DETECTED Final   Respiratory Syncytial Virus NOT DETECTED NOT DETECTED Final   Bordetella pertussis NOT DETECTED NOT DETECTED Final   Bordetella Parapertussis NOT DETECTED NOT DETECTED Final   Chlamydophila pneumoniae NOT DETECTED NOT DETECTED Final   Mycoplasma pneumoniae NOT DETECTED NOT DETECTED Final    Comment: Performed at [facility], [address], [place], [place] [address]  Surgical PCR screen     Status: Abnormal   Collection Time: 11/13/23  4:53 AM   Specimen: Nasal Mucosa; Nasal Swab  Result Value Ref Range Status   MRSA, PCR NEGATIVE NEGATIVE Final   Staphylococcus aureus POSITIVE (A) NEGATIVE Final    Comment: (NOTE) The Xpert SA Assay (FDA approved for NASAL specimens in patients 63 years of age and older), is one component of a comprehensive surveillance program. It is not intended to diagnose infection nor to guide or monitor treatment. Performed at [facility], [address] [place], [place] [address]     Procedures/Studies: CT FEMUR RIGHT W CONTRAST Result Date: 11/13/2023 CLINICAL DATA:  Osteomyelitis suspected, femur, xray done  EXAM: CT OF THE LOWER RIGHT EXTREMITY WITH CONTRAST TECHNIQUE: Multidetector CT imaging of the lower right extremity was performed according to the standard protocol following intravenous contrast administration. RADIATION DOSE REDUCTION: This exam was performed according to the departmental dose-optimization program which includes automated exposure control, adjustment of the mA and/or kV according to patient size and/or use of iterative reconstruction technique. CONTRAST:  75mL OMNIPAQUE  IOHEXOL  350 MG/ML SOLN COMPARISON:   Radiographs 11/12/2023.  MRI 10/30/2023. FINDINGS: Bones/Joint/Cartilage Stable postsurgical changes from below the knee amputation. There is stable mild periosteal reaction along the amputation margin which appears unchanged. No cortical destruction identified to suggest osteomyelitis. No evidence of acute fracture, dislocation or femoral head osteonecrosis. The bones are mildly demineralized. There is a small knee joint effusion. Ligaments Suboptimally assessed by CT. Muscles and Tendons Mild generalized muscular atrophy. No acute or focal abnormality identified. Soft tissues Mildly prominent inguinal lymph nodes bilaterally, likely reactive. Mild subcutaneous edema throughout the right thigh without new or enlarging focal fluid collection. A chronic well-circumscribed cystic lesion along the medial aspect of the distal right thigh is unchanged, measuring 2.3 cm on image 329/4. Mild subcutaneous inflammatory changes along the tibial stump without focal fluid collection, foreign body or soft tissue emphysema. IMPRESSION: 1. Stable postsurgical changes from below the knee amputation. No CT evidence of osteomyelitis. 2. Mild subcutaneous edema throughout the right thigh and along the tibial stump without new or enlarging focal fluid collection, foreign body or soft tissue emphysema. 3. Stable chronic well-circumscribed cystic lesion along the medial aspect of the distal right thigh, likely a sebaceous or ganglion cyst. 4. Mildly prominent inguinal lymph nodes bilaterally, likely reactive. Electronically Signed   By: Elsie Perone M.D.   On: 11/13/2023 11:29   ECHOCARDIOGRAM COMPLETE Result Date: 11/13/2023    ECHOCARDIOGRAM REPORT   Patient Name:   Jake Nguyen Date of Exam: 11/13/2023 Medical Rec #:  [id number]        Height:       70.0 in Accession #:    [id number]       Weight:       153.2 lb Date of Birth:  11/12/1965         BSA:          1.864 m Patient Age:    58 years         BP:           117/64 mmHg  Patient Gender: M                HR:           90 bpm. Exam Location:  Inpatient Procedure: 2D Echo, Color Doppler and Cardiac Doppler (Both Spectral and Color            Flow Doppler were utilized during procedure). Indications:    Shock  History:        Patient has prior history of Echocardiogram examinations, most                 recent 03/20/2021. Chronic alcohol  abuse.  Sonographer:    Benard Stallion Referring Phys: JESSICA MARSHALL IMPRESSIONS  1. Left ventricular ejection fraction, by estimation, is 65 to 70%. The left ventricle has normal function. Left ventricular endocardial border not optimally defined to evaluate regional wall motion. Left ventricular diastolic function could not be evaluated.  2. Right ventricular systolic function was not well visualized. The right ventricular size is not well visualized.  3. The mitral valve is  grossly normal. No evidence of mitral valve regurgitation.  4. The aortic valve is grossly normal. Aortic valve regurgitation is not visualized. Conclusion(s)/Recommendation(s): Extremely limited study given that patient declined the majority of his exam. LV function appears roughly normal. FINDINGS  Left Ventricle: Left ventricular ejection fraction, by estimation, is 65 to 70%. The left ventricle has normal function. Left ventricular endocardial border not optimally defined to evaluate regional wall motion. The left ventricular internal cavity size was normal in size. There is no left ventricular hypertrophy. Left ventricular diastolic function could not be evaluated. Right Ventricle: The right ventricular size is not well visualized. Right vetricular wall thickness was not well visualized. Right ventricular systolic function was not well visualized. Left Atrium: Left atrial size was normal in size. Right Atrium: Right atrial size was not well visualized. Pericardium: The pericardium was not well visualized. Mitral Valve: The mitral valve is grossly normal. No evidence of  mitral valve regurgitation. Tricuspid Valve: The tricuspid valve is not well visualized. Tricuspid valve regurgitation is trivial. No evidence of tricuspid stenosis. Aortic Valve: The aortic valve is grossly normal. Aortic valve regurgitation is not visualized. Pulmonic Valve: The pulmonic valve was not well visualized. No evidence of pulmonic stenosis. Aorta: The aortic root is normal in size and structure. Venous: The inferior vena cava was not well visualized. IAS/Shunts: The interatrial septum was not assessed.  LEFT VENTRICLE PLAX 2D LVIDd:         4.70 cm LVIDs:         2.50 cm LV PW:         0.80 cm LV IVS:        0.80 cm  LEFT ATRIUM         Index LA diam:    3.40 cm 1.82 cm/m Morene Brownie Electronically signed by Morene Brownie Signature Date/Time: 11/13/2023/9:02:12 AM    Final    CT Head Wo Contrast Result Date: 11/12/2023 EXAM: CT HEAD WITHOUT 11/12/2023 10:25:54 PM TECHNIQUE: CT of the head was performed without the administration of intravenous contrast. Automated exposure control, iterative reconstruction, and/or weight based adjustment of the mA/kV was utilized to reduce the radiation dose to as low as reasonably achievable. COMPARISON: 10/27/2023 CLINICAL HISTORY: Head trauma, abnormal mental status (Age 48-64y). FINDINGS: BRAIN AND VENTRICLES: No acute intracranial hemorrhage. No mass effect or midline shift. No extra-axial fluid collection. Gray-white differentiation is maintained. No hydrocephalus. ORBITS: No acute abnormality. SINUSES AND MASTOIDS: No acute abnormality. SOFT TISSUES AND SKULL: No acute skull fracture. No acute soft tissue abnormality. IMPRESSION: 1. No acute intracranial abnormality. Electronically signed by: Franky Stanford MD 11/12/2023 10:32 PM EDT RP Workstation: [id number]   DG Tibia/Fibula Right Result Date: 11/12/2023 EXAM: 3 VIEW(S) XRAY OF THE RIGHT TIBIA AND FIBULA 11/12/2023 08:27:00 PM COMPARISON: Same day knee radiographs and MRI. CLINICAL HISTORY: Infection.  FINDINGS: BONES AND JOINTS: No acute fracture or dislocation. No radiographic evidence of osteomyelitis; however, radiograph is less sensitive than MRI. SOFT TISSUES: Soft tissue swelling about the amputation stump. Right below the knee amputation. No knee joint effusion. IMPRESSION: 1. Right below the knee amputation with soft tissue swelling about the amputation stump. 2. No radiographic evidence of osteomyelitis. Electronically signed by: Norman Gatlin MD 11/12/2023 08:49 PM EDT RP Workstation: [id number]   DG Chest Port 1 View Result Date: 11/12/2023 CLINICAL DATA:  Concern for sepsis. EXAM: PORTABLE CHEST 1 VIEW COMPARISON:  10/27/2023. FINDINGS: The heart size and mediastinal contours are within normal limits. Mild streaky bibasilar opacities are favored to  reflect atelectasis, although, infiltrate cannot be excluded. No pleural effusion or pneumothorax. No acute osseous abnormality. IMPRESSION: Mild streaky bibasilar opacities are favored to reflect atelectasis, although, infiltrate cannot be excluded. Electronically Signed   By: Harrietta Sherry M.D.   On: 11/12/2023 20:05   DG Knee 2 Views Right Result Date: 11/12/2023 CLINICAL DATA:  Right knee wound. History of right below-the-knee amputation. EXAM: RIGHT KNEE - 1-2 VIEW COMPARISON:  10/27/2023 FINDINGS: The distal tibial stump/tibial and fibular transsection margins are not included within the field of view of this exam. No acute fracture or dislocation. No sizeable joint effusion. Generalized subcutaneous edema of the visualized right lower extremity. IMPRESSION: 1. No acute osseous abnormality identified. The distal tibial stump/tibial and fibular transsection margins are not included within the field of view of this exam. 2.  Generalized subcutaneous edema of the right lower extremity. Electronically Signed   By: Harrietta Sherry M.D.   On: 11/12/2023 20:02   MR KNEE RIGHT WO CONTRAST Result Date: 10/30/2023 CLINICAL DATA:  Recurrent, non  healing wound on distal and of stump. History of right below the knee amputation 08/20/2023. EXAM: MRI OF THE RIGHT KNEE WITHOUT CONTRAST TECHNIQUE: Multiplanar, multisequence MR imaging of the knee was performed. No intravenous contrast was administered. COMPARISON:  Radiographs 10/27/2023 and 09/20/2023.  CT 09/04/2023. FINDINGS: Bones/Joint/Cartilage Status post below the knee amputation. Apparent soft tissue wound along the medial aspect of the distal tibial stump, best seen on the coronal images. Focal T2 hyperintensity within the medullary cavity of the distal tibia without cortical destruction. No abnormal signal identified in the distal fibula. The bones are demineralized. No acute osseous findings are seen about the knee. The knee joint is incompletely visualized, without evidence of significant joint effusion. Ligaments The cruciate and collateral ligaments appear intact at the knee. The menisci appear intact. Muscles and Tendons T2 hyperintensity and atrophy throughout the lower leg musculature, likely related to subacute denervation. The visualized extensor mechanism at the knee appears intact. Soft tissue The peripherally enhancing fluid collection adjacent to the amputation seen on previous CT has resolved. As above, possible soft tissue ulceration along the medial aspect of the distal tibia with underlying inflammatory changes in the soft tissues, but no organized fluid collection. The previously demonstrated well-circumscribed cystic lesion along the medial aspect of the medial femoral condyle is again noted and appears unchanged, measuring up to 2.8 x 1.2 cm on image 5/19. This demonstrates heterogeneous T2 signal and homogeneous intermediate T1 signal. IMPRESSION: 1. Status post below the knee amputation with apparent soft tissue wound along the medial aspect of the distal tibial stump. Underlying soft tissue inflammatory changes without residual or recurrent organized fluid collection to suggest  abscess at this time. 2. Focal T2 hyperintensity within the medullary cavity of the distal tibia without cortical destruction, nonspecific given relatively recent surgery. Recurrent early osteomyelitis not excluded. No other significant osseous findings. 3. Stable cystic lesion along the medial aspect of the medial femoral condyle, likely a sebaceous or ganglion cyst. Correlate clinically. 4. T2 hyperintensity and atrophy throughout the lower leg musculature, likely related to subacute denervation. Electronically Signed   By: Elsie Perone M.D.   On: 10/30/2023 11:48   CT Head Wo Contrast Result Date: 10/27/2023 CLINICAL DATA:  Altered mental status. EXAM: CT HEAD WITHOUT CONTRAST TECHNIQUE: Contiguous axial images were obtained from the base of the skull through the vertex without intravenous contrast. RADIATION DOSE REDUCTION: This exam was performed according to the departmental dose-optimization program which includes  automated exposure control, adjustment of the mA and/or kV according to patient size and/or use of iterative reconstruction technique. COMPARISON:  October 22, 2023 FINDINGS: Brain: There is generalized cerebral atrophy with widening of the extra-axial spaces and ventricular dilatation. There are areas of decreased attenuation within the white matter tracts of the supratentorial brain, consistent with microvascular disease changes. Vascular: No hyperdense vessel or unexpected calcification. Skull: Normal. Negative for fracture or focal lesion. Sinuses/Orbits: No acute finding. Other: Mild right frontal scalp soft tissue swelling is seen. IMPRESSION: 1. Generalized cerebral atrophy and microvascular disease changes of the supratentorial brain. 2. No acute intracranial abnormality. 3. Mild right frontal scalp soft tissue swelling. Electronically Signed   By: Suzen Dials M.D.   On: 10/27/2023 23:17   DG Chest Port 1 View Result Date: 10/27/2023 CLINICAL DATA:  Possible sepsis EXAM: PORTABLE  CHEST 1 VIEW COMPARISON:  07/20/2023 FINDINGS: No pleural effusion or pneumothorax. Streaky infrahilar opacities. Normal cardiac size. IMPRESSION: Streaky infrahilar opacities, atelectasis versus pneumonia infiltrate Electronically Signed   By: Luke Bun M.D.   On: 10/27/2023 23:17   DG Knee Complete 4 Views Right Result Date: 10/27/2023 CLINICAL DATA:  Drainage from amputation site EXAM: RIGHT KNEE - COMPLETE 4+ VIEW COMPARISON:  09/20/2023, 09/04/2023 FINDINGS: Status post below the knee amputation. Increased soft tissue swelling the distal stump. No soft tissue gas. Possible subtle erosive change at the cut margin of the distal fibula, slightly progressing periostitis at the cut ends of the distal tibia and fibula. IMPRESSION: Status post below the knee amputation with increased soft tissue swelling of the stump. Slightly progressive periostitis at the cut ends of the distal tibia and fibula with question subtle erosive change at the distal fibular cut margin raising possibility of osteomyelitis. Suggest correlation with MRI. Electronically Signed   By: Luke Bun M.D.   On: 10/27/2023 23:15    Labs: BNP (last 3 results) Recent Labs    08/21/23 0508 08/22/23 0426 08/23/23 0442  BNP 67.0 32.8 17.4   Basic Metabolic Panel: Recent Labs  Lab 11/16/23 0734 11/17/23 0239 11/19/23 0603 11/21/23 0759  NA 134* 138 137 134*  K 3.7 3.9 3.8 3.7  CL 102 104 103 98  CO2 23 25 24 26   GLUCOSE 108* 135* 108* 126*  BUN <5* <5* 6 6  CREATININE 0.54* 0.59* 0.56* 0.60*  CALCIUM  8.6* 8.5* 8.9 9.0  MG 1.7 1.6*  --   --   PHOS 5.0* 4.4  --   --    Liver Function Tests: No results for input(s): AST, ALT, ALKPHOS, BILITOT, PROT, ALBUMIN  in the last 168 hours. No results for input(s): LIPASE, AMYLASE in the last 168 hours. No results for input(s): AMMONIA in the last 168 hours. CBC: Recent Labs  Lab 11/16/23 0734 11/17/23 0239 11/19/23 0603 11/21/23 0759  WBC 6.7 6.3 6.9  7.3  HGB 11.4* 10.6* 11.5* 12.3*  HCT 34.8* 33.4* 35.4* 39.2  MCV 86.4 88.6 87.2 88.9  PLT 396 410* 375 400   CBG: No results for input(s): GLUCAP in the last 168 hours. Hgb A1c No results for input(s): HGBA1C in the last 72 hours. Anemia work up No results for input(s): VITAMINB12, FOLATE, FERRITIN, TIBC, IRON , RETICCTPCT in the last 72 hours. Cardiac Enzymes: No results for input(s): CKTOTAL, CKMB, CKMBINDEX, TROPONINI in the last 168 hours. BNP: Invalid input(s): POCBNP D-Dimer No results for input(s): DDIMER in the last 72 hours. Lipid Profile No results for input(s): CHOL, HDL, LDLCALC, TRIG, CHOLHDL, LDLDIRECT in  the last 72 hours. Thyroid  function studies No results for input(s): TSH, T4TOTAL, T3FREE, THYROIDAB in the last 72 hours.  Invalid input(s): FREET3 Urinalysis    Component Value Date/Time   COLORURINE STRAW (A) 11/12/2023 2243   APPEARANCEUR CLEAR 11/12/2023 2243   LABSPEC 1.005 11/12/2023 2243   PHURINE 5.0 11/12/2023 2243   GLUCOSEU NEGATIVE 11/12/2023 2243   HGBUR NEGATIVE 11/12/2023 2243   BILIRUBINUR NEGATIVE 11/12/2023 2243   KETONESUR NEGATIVE 11/12/2023 2243   PROTEINUR NEGATIVE 11/12/2023 2243   UROBILINOGEN 0.2 08/02/2009 2144   NITRITE NEGATIVE 11/12/2023 2243   LEUKOCYTESUR NEGATIVE 11/12/2023 2243   Sepsis Labs Recent Labs  Lab 11/16/23 0734 11/17/23 0239 11/19/23 0603 11/21/23 0759  WBC 6.7 6.3 6.9 7.3   Microbiology Recent Results (from the past 240 hours)  Blood Culture (routine x 2)     Status: None   Collection Time: 11/12/23  8:10 PM   Specimen: BLOOD  Result Value Ref Range Status   Specimen Description BLOOD SITE NOT SPECIFIED  Final   Special Requests   Final    BOTTLES DRAWN AEROBIC AND ANAEROBIC Blood Culture adequate volume   Culture   Final    NO GROWTH 5 DAYS Performed at [facility], [address] [place], [place] [address]    Report Status 11/17/2023  FINAL  Final  Blood Culture (routine x 2)     Status: None   Collection Time: 11/12/23  9:14 PM   Specimen: BLOOD  Result Value Ref Range Status   Specimen Description BLOOD LEFT ANTECUBITAL  Final   Special Requests   Final    BOTTLES DRAWN AEROBIC AND ANAEROBIC Blood Culture adequate volume   Culture   Final    NO GROWTH 5 DAYS Performed at [facility], [address], [place], [place] [address]    Report Status 11/17/2023 FINAL  Final  Respiratory (~20 pathogens) panel by PCR     Status: None   Collection Time: 11/13/23  1:58 AM   Specimen: Nasopharyngeal Swab; Respiratory  Result Value Ref Range Status   Adenovirus NOT DETECTED NOT DETECTED Final   Coronavirus 229E NOT DETECTED NOT DETECTED Final    Comment: (NOTE) The Coronavirus on the Respiratory Panel, DOES NOT test for the novel  Coronavirus (2019 nCoV)    Coronavirus HKU1 NOT DETECTED NOT DETECTED Final   Coronavirus NL63 NOT DETECTED NOT DETECTED Final   Coronavirus OC43 NOT DETECTED NOT DETECTED Final   Metapneumovirus NOT DETECTED NOT DETECTED Final   Rhinovirus / Enterovirus NOT DETECTED NOT DETECTED Final   Influenza A NOT DETECTED NOT DETECTED Final   Influenza B NOT DETECTED NOT DETECTED Final   Parainfluenza Virus 1 NOT DETECTED NOT DETECTED Final   Parainfluenza Virus 2 NOT DETECTED NOT DETECTED Final   Parainfluenza Virus 3 NOT DETECTED NOT DETECTED Final   Parainfluenza Virus 4 NOT DETECTED NOT DETECTED Final   Respiratory Syncytial Virus NOT DETECTED NOT DETECTED Final   Bordetella pertussis NOT DETECTED NOT DETECTED Final   Bordetella Parapertussis NOT DETECTED NOT DETECTED Final   Chlamydophila pneumoniae NOT DETECTED NOT DETECTED Final   Mycoplasma pneumoniae NOT DETECTED NOT DETECTED Final    Comment: Performed at [facility], [address] [place], [place] [address]  Surgical PCR screen     Status: Abnormal   Collection Time: 11/13/23  4:53 AM   Specimen: Nasal Mucosa; Nasal Swab   Result Value Ref Range Status   MRSA, PCR NEGATIVE NEGATIVE  Final   Staphylococcus aureus POSITIVE (A) NEGATIVE Final    Comment: (NOTE) The Xpert SA Assay (FDA approved for NASAL specimens in patients 24 years of age and older), is one component of a comprehensive surveillance program. It is not intended to diagnose infection nor to guide or monitor treatment. Performed at [facility], [address], [place], [place] [address]    Time coordinating discharge: 35 minutes  SIGNED: Mennie LAMY, MD  [facility] 11/22/2023, 11:32 AM  If 7PM-7AM, please contact night-coverage [email or web address]

## 2023-11-22 NOTE — Progress Notes (Signed)
 11/22/2023  Jake Nguyen DOB: 12-18-1965 MRN: [id number]   RIDER WAIVER AND RELEASE OF LIABILITY  For the purposes of helping with transportation needs, [facility] partners with outside transportation providers (taxi companies, [place], Catering manager.) to give [place] patients or other approved people the choice of on-demand rides Public librarian) to our buildings for non-emergency visits.  By using [employer], I, the person signing this document, on behalf of myself and/or any legal minors (in my care using the [employer]), agree:  Science writer given to me are supplied by independent, outside transportation providers who do not work for, or have any affiliation with, [employer]. [facility] is not a transportation company. [facility] has no control over the quality or safety of the rides I get using [employer]. [facility] has no control over whether any outside ride will happen on time or not. [facility] gives no guarantee on the reliability, quality, safety, or availability on any rides, or that no mistakes will happen. I know and accept that traveling by vehicle (car, truck, SVU, fleeta, bus, taxi, etc.) has risks of serious injuries such as disability, being paralyzed, and death. I know and agree the risk of using [employer] is mine alone, and not [employer]. [employer] are provided as is and as are available. The transportation providers are in charge for all inspections and care of the vehicles used to provide these rides. I agree not to take legal action against [facility], its agents, employees, officers, directors, representatives, insurers, attorneys, assigns, successors, subsidiaries, and affiliates at any time for any reasons related directly or indirectly to using [employer]. I also agree not to take legal action against [facility] or its affiliates for any injury, death, or damage to property caused by or related to using  [employer]. I have read this Waiver and Release of Liability, and I understand the terms used in it and their legal meaning. This Waiver is freely and voluntarily given with the understanding that my right (or any legal minors) to legal action against  relating to [employer] is knowingly given up to use these services.   I attest that I read the Ride Waiver and Release of Liability to Jake Nguyen, gave [name] the opportunity to ask questions and answered the questions asked (if any). I affirm that Jake Nguyen then provided consent for assistance with transportation.

## 2023-11-22 NOTE — Progress Notes (Signed)
 Physical Therapy Treatment Patient Details Name: [name] MRN: [id number] DOB: 1965/10/20 Today's Date: 11/22/2023   History of Present Illness Pt is a 58 y/o male admitted to [facility] on 8/3 for fall, found to be hypotensive and tachycardic. Pt recently admitted on 7/28 for R BKA infection. PMH: etoh use, tobacco use, anxiety, depression, L AKA, R BKA    PT Comments  Pt willing to participate with therapy today. Focus of session wheelchair management as discharge likely today. Pt still needs reminders to not use R residual limb for pivot point or to pull him self forward. Pt able to complete all of his mobility without use of R residual limb. Pt is independent in transfer to EoB, supervision for transfer from bed to recliner and back with reminders. Pt independent with wheelchair mobility. D/c plan remains optimal, however pt would like to return to [facility].         Can travel by private vehicle     Yes  Equipment Recommendations  Wheelchair cushion (measurements PT)       Precautions / Restrictions Precautions Precautions: Fall Recall of Precautions/Restrictions: Impaired Precaution/Restrictions Comments: NWB through R BKA Restrictions Weight Bearing Restrictions Per Provider Order: Yes RLE Weight Bearing Per Provider Order: Non weight bearing Other Position/Activity Restrictions: R BKA, L AKA     Mobility  Bed Mobility Overal bed mobility: Needs Assistance Bed Mobility: Supine to Sit, Rolling, Sit to Supine Rolling: Independent   Supine to sit: Independent Sit to supine: Independent   General bed mobility comments: Independent in all bed mobility including rolling onto his stomach for hip flexor stretch    Transfers Overall transfer level: Needs assistance Equipment used: None Transfers: Bed to chair/wheelchair/BSC         Anterior-Posterior transfers: Supervision   General transfer comment: supervision for transfers to and from wheelchair, cuing for not digging R  LE into bed surface to use as a pivot point    Ambulation/Gait               General Gait Details: Administrator, arts Wheelchair mobility: Yes Wheelchair propulsion: Both upper extremities Wheelchair parts: Independent Distance: 600 Wheelchair Assistance Details (indicate cue type and reason): Pt demonstrates high proficiency with wheelchair able to navigate around obstacle in hallway         Balance Overall balance assessment: Needs assistance Sitting-balance support: No upper extremity supported Sitting balance-Leahy Scale: Good Sitting balance - Comments: long sitting in bed.                                    Communication Communication Communication: No apparent difficulties  Cognition Arousal: Alert Behavior During Therapy: WFL for tasks assessed/performed   PT - Cognitive impairments: No apparent impairments                       PT - Cognition Comments: able to teach back what he needs to do to protect wound on RLE and however continues to need cuing for not usin R  residual limb to push through for transfers Following commands: Intact      Cueing Cueing Techniques: Verbal cues  Exercises General Exercises - Lower Extremity Straight Leg Raises: AROM, Both, 10 reps, Seated Amputee Exercises Quad Sets: AROM, 5 reps, Supine, Both Hip ABduction/ADduction: AROM, 5 reps, Supine, Both Hip Flexion/Marching: AROM, 5 reps, Supine, Both Knee Flexion: AROM,  Right, 10 reps, Supine Knee Extension: AROM, Right, 10 reps, Supine Other Exercises Other Exercises: prone positioning for 30 sec    General Comments General comments (skin integrity, edema, etc.): R BKA wrapped in Kerlix, clean dry and in tact      Pertinent Vitals/Pain Pain Assessment Pain Assessment: Faces Faces Pain Scale: Hurts a little bit Pain Location: L BKA Pain Descriptors / Indicators: Discomfort, Guarding, Aching Pain  Intervention(s): Limited activity within patient's tolerance, Monitored during session, Repositioned     PT Goals (current goals can now be found in the care plan section) Acute Rehab PT Goals Patient Stated Goal: not stated PT Goal Formulation: With patient Time For Goal Achievement: 11/30/23 Potential to Achieve Goals: Fair Progress towards PT goals: Progressing toward goals    Frequency    Min 2X/week       AM-PAC PT 6 Clicks Mobility   Outcome Measure  Help needed turning from your back to your side while in a flat bed without using bedrails?: A Little Help needed moving from lying on your back to sitting on the side of a flat bed without using bedrails?: A Little Help needed moving to and from a bed to a chair (including a wheelchair)?: A Little Help needed standing up from a chair using your arms (e.g., wheelchair or bedside chair)?: Total Help needed to walk in hospital room?: Total Help needed climbing 3-5 steps with a railing? : Total 6 Click Score: 12    End of Session   Activity Tolerance: Patient tolerated treatment well Patient left: with call bell/phone within reach;in chair;with chair alarm set;Other (comment) (request to change pt's bed linens) Nurse Communication: Mobility status PT Visit Diagnosis: Other abnormalities of gait and mobility (R26.89)     Time: 9082-9059 PT Time Calculation (min) (ACUTE ONLY): 23 min  Charges:    $Therapeutic Exercise: 8-22 mins $Wheel Chair Management: 8-22 mins PT General Charges $$ ACUTE PT VISIT: 1 Visit                     Jake Nguyen PT, DPT Acute Rehabilitation Services Please use secure chat or  Call Office [phone]    Jake Nguyen [facility] 11/22/2023, 12:06 PM

## 2023-11-22 NOTE — Progress Notes (Signed)
 PROGRESS NOTE Jake Nguyen  FMW:[id number] DOB: 1966/01/17 DOA: 11/12/2023 PCP: [facility], No  Brief Narrative/Hospital Course: 58 year old unfortunate gentleman w/ multiple medical history w/ right BKA 08/2023, left BKA 2022, chronic alcohol  use disorder, anemia of chronic disease, chronic thrombocytopenia, history of recurrent hyponatremia secondary to chronic alcohol  use, anxiety, and depression and homelessness brought to the ED via EMS after a fall [place] bus stop an[place] EMS found patient was hypotensive, tachycardic. 7/18 to 7/28 right BKA with osteomyelitis and recent hospital admission for stump infection on 7/28 treated with IV vancomycin  and ceftriaxone -orthopedic Dr. Harden was consulted recommended continue IV antibiotic to complete 7 days followed by continue oral antibiotics for 7 days patient was discharged home with Augmentin  and doxycycline  Patient is stating that he has not been compliant with the antibiotic he has been prescribed and discharged with Patient was admitted for  sepsis secondary to right BKA stump infection. X-ray ruled out osteomyelitis. In the ED UDS positive with benzo, although benzos not listed in the meds, patient remained persistently hypertensive and was transferred to ICU 8/4.  CT head negative UA negative RVP negative.  Chest x-ray clear EKG sinus tachycardia Patient transferred to [facility] 8/5> managed with dressing changes, IV antibiotics subsequently changed to p.o. At this time waiting for SNF placement  Subjective: Seen and examined Aaox3 no new complaints Overnight afebrile BP 90s-100 asymptomatic.  Labs reviewed remains unchanged 8/12   Assessment and plan:  Hypotension-multifactorial Initially suspected septic shock-no obvious source Right transtibial amputation with ischemic changes: Initially managed in ICU, vitals stabilized and transferred to TRH service Work up unyielding with:UA and chest x-ray with no evidence of infection.X-ray stump- no  osteomyelitis.Blood culture 8/3 NGTD Dr. Harden input appreciated- felt to be ischemic changes on right transtibial amputation-advised antibiotics, dressing changes Ischemic changes are improving, no surgery indicated  AND okay for SNF placement once available IV antibiotics were changed to p.o. 8/9 complete 1 week course  Weaning off on midodrine > changed to 2.5 mg tid 8/12-hopefully can stop soon  Left AKA status: Old.stable.  Hypokalemia: Resolved.  Hypotension History of hypertension: Stable continue midodrine   Hyperlipidemia: Continue statin.  Substance abuse Alcohol  abuse Anxiety/depression Homelessness: Continue supportive care.No signs of withdrawal.  Continue multivitamin thiamine . TOC consult for resources.  He is homeless and disposition will be difficult  Mobility: PT Orders: Active PT Follow up Rec: Long-Term Institutional Care Without Follow-Up Therapy8/02/2024 1600    DVT prophylaxis: rivaroxaban  (XARELTO ) tablet 10 mg Start: 11/18/23 1345 Code Status:   Code Status: Full Code Family Communication: plan of care discussed with patien at bedside. Patient status is: Remains hospitalized because of severity of illness Level of care: Telemetry Medical   Dispo: The patient is from: homeless            Anticipated disposition: Awaiting placement. Objective: Vitals last 24 hrs: Vitals:   11/21/23 2028 11/22/23 0024 11/22/23 0432 11/22/23 0436  BP: 117/76 98/72  97/60  Pulse: 64 92  84  Resp: 16 18  18   Temp: 98.4 F (36.9 C) 98.5 F (36.9 C)  98.4 F (36.9 C)  TempSrc: Oral     SpO2: 100% 95%  96%  Weight:   66.8 kg   Height:        Physical Examination: General exam: AAOX3, pleasant  HEENT:Oral mucosa moist, Ear/Nose WNL grossly Respiratory system: B/L Clear BS,no use of accessory muscle Cardiovascular system: S1 & S2 +, No JVD. Gastrointestinal system: Abdomen soft,NT,ND, BS+ Nervous System: Alert awake nonfocal Extremities:  Old left AKA stump, Rt  BKA-dressing in place  c/d/I- Skin: No rashes,no icterus. MSK: Normal muscle bulk,tone, power  See picture below  Medications reviewed:  Scheduled Meds:  amoxicillin -clavulanate  1 tablet Oral Q12H   atorvastatin   40 mg Oral Daily   Chlorhexidine  Gluconate Cloth  6 each Topical Daily   doxycycline   100 mg Oral Q12H   folic acid   1 mg Oral Daily   gabapentin   100 mg Oral TID   Gerhardt's butt cream   Topical BID   leptospermum manuka honey  1 Application Topical Daily   melatonin  5 mg Oral QHS   midodrine   2.5 mg Oral TID WC   multivitamin with minerals  1 tablet Oral Daily   pantoprazole   40 mg Oral Daily   rivaroxaban   10 mg Oral Daily   sodium chloride  flush  3 mL Intravenous Q12H   sodium chloride  flush  3 mL Intravenous Q12H   thiamine   100 mg Oral Daily   Continuous Infusions:   Diet: Diet Order             Diet regular Room service appropriate? Yes; Fluid consistency: Thin  Diet effective now                   Data Reviewed: I have personally reviewed following labs and imaging studies ( see epic result tab) CBC: Recent Labs  Lab 11/16/23 0734 11/17/23 0239 11/19/23 0603 11/21/23 0759  WBC 6.7 6.3 6.9 7.3  HGB 11.4* 10.6* 11.5* 12.3*  HCT 34.8* 33.4* 35.4* 39.2  MCV 86.4 88.6 87.2 88.9  PLT 396 410* 375 400   CMP: Recent Labs  Lab 11/16/23 0734 11/17/23 0239 11/19/23 0603 11/21/23 0759  NA 134* 138 137 134*  K 3.7 3.9 3.8 3.7  CL 102 104 103 98  CO2 23 25 24 26   GLUCOSE 108* 135* 108* 126*  BUN <5* <5* 6 6  CREATININE 0.54* 0.59* 0.56* 0.60*  CALCIUM  8.6* 8.5* 8.9 9.0  MG 1.7 1.6*  --   --   PHOS 5.0* 4.4  --   --    GFR: Estimated Creatinine Clearance: 97.4 mL/min (A) (by C-G formula based on SCr of 0.6 mg/dL (L)). No results for input(s): AST, ALT, ALKPHOS, BILITOT, PROT, ALBUMIN  in the last 168 hours.  No results for input(s): LIPASE, AMYLASE in the last 168 hours. No results for input(s): AMMONIA in the last 168  hours. Coagulation Profile:  No results for input(s): INR, PROTIME in the last 168 hours.  Unresulted Labs (From admission, onward)     Start     Ordered   11/19/23 0500  CBC  Every 48 hours,   R     Question:  Specimen collection method  Answer:  Lab=Lab collect   11/17/23 0830   11/19/23 0500  Basic metabolic panel  Every 48 hours,   R     Question:  Specimen collection method  Answer:  Lab=Lab collect   11/17/23 0830           Antimicrobials/Microbiology: Anti-infectives (From admission, onward)    Start     Dose/Rate Route Frequency Ordered Stop   11/18/23 1200  amoxicillin -clavulanate (AUGMENTIN ) 875-125 MG per tablet 1 tablet        1 tablet Oral Every 12 hours 11/18/23 0914 11/25/23 0959   11/18/23 1000  doxycycline  (VIBRA -TABS) tablet 100 mg        100 mg Oral Every 12 hours 11/18/23 0914  11/16/23 1000  vancomycin  (VANCOREADY) IVPB 1250 mg/250 mL  Status:  Discontinued        1,250 mg 166.7 mL/hr over 90 Minutes Intravenous Every 12 hours 11/16/23 0140 11/18/23 0924   11/14/23 2100  piperacillin -tazobactam (ZOSYN ) IVPB 3.375 g  Status:  Discontinued       Placed in Followed by Linked Group   3.375 g 12.5 mL/hr over 240 Minutes Intravenous Every 8 hours 11/14/23 2037 11/18/23 0922   11/14/23 1100  vancomycin  (VANCOCIN ) 1,000 mg in sodium chloride  0.9 % 250 mL IVPB  Status:  Discontinued        1,000 mg 250 mL/hr over 60 Minutes Intravenous Every 12 hours 11/14/23 1040 11/16/23 0140   11/13/23 1000  vancomycin  (VANCOCIN ) IVPB 1000 mg/200 mL premix  Status:  Discontinued        1,000 mg 200 mL/hr over 60 Minutes Intravenous Every 12 hours 11/12/23 2047 11/14/23 1040   11/13/23 0400  piperacillin -tazobactam (ZOSYN ) IVPB 3.375 g  Status:  Discontinued       Placed in Followed by Linked Group   3.375 g 12.5 mL/hr over 240 Minutes Intravenous Every 8 hours 11/12/23 1950 11/14/23 2037   11/12/23 2000  piperacillin -tazobactam (ZOSYN ) IVPB 3.375 g       Placed  in Followed by Linked Group   3.375 g 100 mL/hr over 30 Minutes Intravenous  Once 11/12/23 1950 11/12/23 2130   11/12/23 2000  vancomycin  (VANCOREADY) IVPB 1500 mg/300 mL        1,500 mg 150 mL/hr over 120 Minutes Intravenous  Once 11/12/23 1950 11/12/23 2350         Component Value Date/Time   SDES BLOOD LEFT ANTECUBITAL 11/12/2023 2114   SPECREQUEST  11/12/2023 2114    BOTTLES DRAWN AEROBIC AND ANAEROBIC Blood Culture adequate volume   CULT  11/12/2023 2114    NO GROWTH 5 DAYS Performed at [facility], [address], [place], [place] [address]    REPTSTATUS 11/17/2023 FINAL 11/12/2023 2114     Procedures:  Mennie LAMY, MD [facility] 11/22/2023, 9:23 AM

## 2023-12-03 ENCOUNTER — Other Ambulatory Visit: Payer: Self-pay

## 2023-12-03 ENCOUNTER — Emergency Department (HOSPITAL_COMMUNITY): Payer: MEDICAID

## 2023-12-03 ENCOUNTER — Encounter (HOSPITAL_COMMUNITY): Payer: Self-pay

## 2023-12-03 ENCOUNTER — Inpatient Hospital Stay (HOSPITAL_COMMUNITY)
Admission: EM | Admit: 2023-12-03 | Discharge: 2023-12-12 | DRG: 641 | Disposition: A | Discharge status: Home / Self Care | Payer: MEDICAID | Attending: Family Medicine | Admitting: Family Medicine

## 2023-12-03 DIAGNOSIS — Z91128 Patient's intentional underdosing of medication regimen for other reason: Secondary | ICD-10-CM

## 2023-12-03 DIAGNOSIS — E861 Hypovolemia: Secondary | ICD-10-CM | POA: Diagnosis present

## 2023-12-03 DIAGNOSIS — E871 Hypo-osmolality and hyponatremia: Principal | ICD-10-CM | POA: Diagnosis present

## 2023-12-03 DIAGNOSIS — W050XXA Fall from non-moving wheelchair, initial encounter: Secondary | ICD-10-CM | POA: Diagnosis present

## 2023-12-03 DIAGNOSIS — E440 Moderate protein-calorie malnutrition: Secondary | ICD-10-CM | POA: Diagnosis present

## 2023-12-03 DIAGNOSIS — T3696XA Underdosing of unspecified systemic antibiotic, initial encounter: Secondary | ICD-10-CM | POA: Diagnosis present

## 2023-12-03 DIAGNOSIS — D638 Anemia in other chronic diseases classified elsewhere: Secondary | ICD-10-CM | POA: Diagnosis present

## 2023-12-03 DIAGNOSIS — Z89512 Acquired absence of left leg below knee: Secondary | ICD-10-CM

## 2023-12-03 DIAGNOSIS — F32A Depression, unspecified: Secondary | ICD-10-CM | POA: Diagnosis present

## 2023-12-03 DIAGNOSIS — Z993 Dependence on wheelchair: Secondary | ICD-10-CM

## 2023-12-03 DIAGNOSIS — I4891 Unspecified atrial fibrillation: Secondary | ICD-10-CM | POA: Diagnosis present

## 2023-12-03 DIAGNOSIS — E876 Hypokalemia: Secondary | ICD-10-CM | POA: Diagnosis present

## 2023-12-03 DIAGNOSIS — Z5900 Homelessness unspecified: Secondary | ICD-10-CM | POA: Diagnosis not present

## 2023-12-03 DIAGNOSIS — Y904 Blood alcohol level of 80-99 mg/100 ml: Secondary | ICD-10-CM | POA: Diagnosis present

## 2023-12-03 DIAGNOSIS — I471 Supraventricular tachycardia, unspecified: Secondary | ICD-10-CM | POA: Diagnosis not present

## 2023-12-03 DIAGNOSIS — E878 Other disorders of electrolyte and fluid balance, not elsewhere classified: Secondary | ICD-10-CM | POA: Diagnosis present

## 2023-12-03 DIAGNOSIS — F419 Anxiety disorder, unspecified: Secondary | ICD-10-CM | POA: Diagnosis present

## 2023-12-03 DIAGNOSIS — Z91014 Allergy to mammalian meats: Secondary | ICD-10-CM

## 2023-12-03 DIAGNOSIS — E872 Acidosis, unspecified: Secondary | ICD-10-CM | POA: Diagnosis present

## 2023-12-03 DIAGNOSIS — Y929 Unspecified place or not applicable: Secondary | ICD-10-CM | POA: Diagnosis not present

## 2023-12-03 DIAGNOSIS — F17210 Nicotine dependence, cigarettes, uncomplicated: Secondary | ICD-10-CM | POA: Diagnosis present

## 2023-12-03 DIAGNOSIS — Z89511 Acquired absence of right leg below knee: Secondary | ICD-10-CM

## 2023-12-03 DIAGNOSIS — D72829 Elevated white blood cell count, unspecified: Secondary | ICD-10-CM | POA: Diagnosis present

## 2023-12-03 DIAGNOSIS — F10129 Alcohol abuse with intoxication, unspecified: Secondary | ICD-10-CM | POA: Diagnosis present

## 2023-12-03 DIAGNOSIS — F1010 Alcohol abuse, uncomplicated: Secondary | ICD-10-CM

## 2023-12-03 DIAGNOSIS — L24A2 Irritant contact dermatitis due to fecal, urinary or dual incontinence: Secondary | ICD-10-CM | POA: Diagnosis present

## 2023-12-03 DIAGNOSIS — Z79899 Other long term (current) drug therapy: Secondary | ICD-10-CM

## 2023-12-03 DIAGNOSIS — I480 Paroxysmal atrial fibrillation: Secondary | ICD-10-CM

## 2023-12-03 DIAGNOSIS — F10931 Alcohol use, unspecified with withdrawal delirium: Secondary | ICD-10-CM | POA: Diagnosis not present

## 2023-12-03 DIAGNOSIS — D696 Thrombocytopenia, unspecified: Secondary | ICD-10-CM | POA: Diagnosis present

## 2023-12-03 DIAGNOSIS — G4700 Insomnia, unspecified: Secondary | ICD-10-CM | POA: Diagnosis present

## 2023-12-03 DIAGNOSIS — Z6820 Body mass index (BMI) 20.0-20.9, adult: Secondary | ICD-10-CM

## 2023-12-03 LAB — I-STAT CG4 LACTIC ACID, ED
Lactic Acid, Venous: 2.9 mmol/L (ref 0.5–1.9)
Lactic Acid, Venous: 1.4 mmol/L (ref 0.5–1.9)

## 2023-12-03 LAB — I-STAT VENOUS BLOOD GAS, ED
Acid-Base Excess: 0 mmol/L (ref 0.0–2.0)
Bicarbonate: 25.5 mmol/L (ref 20.0–28.0)
Calcium, Ion: 0.96 mmol/L — ABNORMAL LOW (ref 1.15–1.40)
HCT: 38 % — ABNORMAL LOW (ref 39.0–52.0)
Hemoglobin: 12.9 g/dL — ABNORMAL LOW (ref 13.0–17.0)
O2 Saturation: 72 %
Potassium: 3.4 mmol/L — ABNORMAL LOW (ref 3.5–5.1)
Sodium: 108 mmol/L — CL (ref 135–145)
TCO2: 27 mmol/L (ref 22–32)
pCO2, Ven: 44.4 mmHg (ref 44–60)
pH, Ven: 7.368 (ref 7.25–7.43)
pO2, Ven: 39 mmHg (ref 32–45)

## 2023-12-03 LAB — COMPREHENSIVE METABOLIC PANEL WITH GFR
ALT: 29 U/L (ref 0–44)
AST: 116 U/L — ABNORMAL HIGH (ref 15–41)
Albumin: 2.6 g/dL — ABNORMAL LOW (ref 3.5–5.0)
Alkaline Phosphatase: 56 U/L (ref 38–126)
Anion gap: 17 — ABNORMAL HIGH (ref 5–15)
BUN: 5 mg/dL — ABNORMAL LOW (ref 6–20)
CO2: 21 mmol/L — ABNORMAL LOW (ref 22–32)
Calcium: 7.8 mg/dL — ABNORMAL LOW (ref 8.9–10.3)
Chloride: 71 mmol/L — ABNORMAL LOW (ref 98–111)
Creatinine, Ser: 0.6 mg/dL — ABNORMAL LOW (ref 0.61–1.24)
GFR, Estimated: >60 mL/min (ref >60)
Glucose, Bld: 118 mg/dL — ABNORMAL HIGH (ref 70–99)
Potassium: 3.4 mmol/L — ABNORMAL LOW (ref 3.5–5.1)
Sodium: 109 mmol/L — CL (ref 135–145)
Total Bilirubin: 1 mg/dL (ref 0.0–1.2)
Total Protein: 6.9 g/dL (ref 6.5–8.1)

## 2023-12-03 LAB — CBC WITH DIFFERENTIAL/PLATELET
Abs Immature Granulocytes: 0.14 K/uL — ABNORMAL HIGH (ref 0.00–0.07)
Basophils Absolute: 0 K/uL (ref 0.0–0.1)
Basophils Relative: 0 %
Eosinophils Absolute: 0 K/uL (ref 0.0–0.5)
Eosinophils Relative: 0 %
HCT: 32.4 % — ABNORMAL LOW (ref 39.0–52.0)
Hemoglobin: 11.4 g/dL — ABNORMAL LOW (ref 13.0–17.0)
Immature Granulocytes: 1 %
Lymphocytes Relative: 16 %
Lymphs Abs: 2.4 K/uL (ref 0.7–4.0)
MCH: 27.5 pg (ref 26.0–34.0)
MCHC: 35.2 g/dL (ref 30.0–36.0)
MCV: 78.3 fL — ABNORMAL LOW (ref 80.0–100.0)
Monocytes Absolute: 1.8 K/uL — ABNORMAL HIGH (ref 0.1–1.0)
Monocytes Relative: 12 %
Neutro Abs: 10.5 K/uL — ABNORMAL HIGH (ref 1.7–7.7)
Neutrophils Relative %: 71 %
Platelets: 338 K/uL (ref 150–400)
RBC: 4.14 MIL/uL — ABNORMAL LOW (ref 4.22–5.81)
RDW: 13.5 % (ref 11.5–15.5)
WBC: 14.8 K/uL — ABNORMAL HIGH (ref 4.0–10.5)
nRBC: 0 % (ref 0.0–0.2)

## 2023-12-03 LAB — SODIUM, URINE, RANDOM: Sodium, Ur: <10 mmol/L

## 2023-12-03 LAB — URINALYSIS, ROUTINE W REFLEX MICROSCOPIC
Bilirubin Urine: NEGATIVE
Glucose, UA: NEGATIVE mg/dL
Hgb urine dipstick: NEGATIVE
Ketones, ur: 20 mg/dL — AB
Leukocytes,Ua: NEGATIVE
Nitrite: NEGATIVE
Protein, ur: NEGATIVE mg/dL
Specific Gravity, Urine: 1.012 (ref 1.005–1.030)
pH: 6 (ref 5.0–8.0)

## 2023-12-03 LAB — RAPID URINE DRUG SCREEN, HOSP PERFORMED
Amphetamines: NOT DETECTED
Barbiturates: NOT DETECTED
Benzodiazepines: NOT DETECTED
Cocaine: NOT DETECTED
Opiates: NOT DETECTED
Tetrahydrocannabinol: POSITIVE — AB

## 2023-12-03 LAB — MRSA NEXT GEN BY PCR, NASAL: MRSA by PCR Next Gen: NOT DETECTED

## 2023-12-03 LAB — SODIUM
Sodium: 111 mmol/L — CL (ref 135–145)
Sodium: 119 mmol/L — CL (ref 135–145)
Sodium: 121 mmol/L — ABNORMAL LOW (ref 135–145)

## 2023-12-03 LAB — GLUCOSE, CAPILLARY
Glucose-Capillary: 137 mg/dL — ABNORMAL HIGH (ref 70–99)
Glucose-Capillary: 121 mg/dL — ABNORMAL HIGH (ref 70–99)

## 2023-12-03 LAB — LIPASE, BLOOD: Lipase: 21 U/L (ref 11–51)

## 2023-12-03 LAB — TSH: TSH: 0.396 u[IU]/mL (ref 0.350–4.500)

## 2023-12-03 LAB — OSMOLALITY, URINE: Osmolality, Ur: 129 mosm/kg — ABNORMAL LOW (ref 300–900)

## 2023-12-03 LAB — OSMOLALITY: Osmolality: 252 mosm/kg — ABNORMAL LOW (ref 275–295)

## 2023-12-03 LAB — ETHANOL: Alcohol, Ethyl (B): 83 mg/dL — ABNORMAL HIGH (ref <15)

## 2023-12-03 MED ORDER — LORAZEPAM 1 MG PO TABS: 1.0000 mg | ORAL_TABLET | ORAL | Status: DC | PRN

## 2023-12-03 MED ORDER — FOLIC ACID 1 MG PO TABS
1.0000 mg | ORAL_TABLET | Freq: Every day | ORAL | Status: DC
  Administered 2023-12-03 – 2023-12-12 (×10): 1 mg via ORAL
  Filled 2023-12-03 (×9): qty 1

## 2023-12-03 MED ORDER — POLYETHYLENE GLYCOL 3350 17 G PO PACK: 17.0000 g | PACK | Freq: Every day | ORAL | Status: DC | PRN

## 2023-12-03 MED ORDER — ENOXAPARIN SODIUM 30 MG/0.3ML IJ SOSY
30.0000 mg | PREFILLED_SYRINGE | INTRAMUSCULAR | Status: DC
  Administered 2023-12-03: 30 mg via SUBCUTANEOUS
  Filled 2023-12-03: qty 0.3

## 2023-12-03 MED ORDER — SODIUM CHLORIDE 0.9 % IV SOLN
3.0000 g | Freq: Four times a day (QID) | INTRAVENOUS | Status: DC
  Administered 2023-12-03 – 2023-12-04 (×4): 3 g via INTRAVENOUS
  Filled 2023-12-03 (×4): qty 8

## 2023-12-03 MED ORDER — DIAZEPAM 5 MG PO TABS: 0.0000 mg | ORAL_TABLET | ORAL | Status: DC | PRN

## 2023-12-03 MED ORDER — THIAMINE MONONITRATE 100 MG PO TABS
100.0000 mg | ORAL_TABLET | Freq: Every day | ORAL | Status: DC
  Administered 2023-12-03 – 2023-12-12 (×9): 100 mg via ORAL
  Filled 2023-12-03 (×10): qty 1

## 2023-12-03 MED ORDER — ONDANSETRON 4 MG PO TBDP: 4.0000 mg | ORAL_TABLET | Freq: Once | ORAL | Status: DC

## 2023-12-03 MED ORDER — DEXTROSE 5 % IV SOLN: INTRAVENOUS | Status: DC

## 2023-12-03 MED ORDER — SODIUM CHLORIDE 3 % IV SOLN
INTRAVENOUS | Status: DC
  Filled 2023-12-03 (×2): qty 500

## 2023-12-03 MED ORDER — THIAMINE HCL 100 MG/ML IJ SOLN
100.0000 mg | Freq: Every day | INTRAMUSCULAR | Status: DC
  Administered 2023-12-04: 100 mg via INTRAVENOUS
  Filled 2023-12-03 (×2): qty 2

## 2023-12-03 MED ORDER — IOHEXOL 350 MG/ML SOLN
75.0000 mL | Freq: Once | INTRAVENOUS | Status: AC | PRN
  Administered 2023-12-03: 75 mL via INTRAVENOUS

## 2023-12-03 MED ORDER — GERHARDT'S BUTT CREAM
TOPICAL_CREAM | Freq: Two times a day (BID) | CUTANEOUS | Status: DC
  Filled 2023-12-03 (×2): qty 60

## 2023-12-03 MED ORDER — LORAZEPAM 2 MG/ML IJ SOLN: 1.0000 mg | INTRAMUSCULAR | Status: DC | PRN

## 2023-12-03 MED ORDER — ADULT MULTIVITAMIN W/MINERALS CH
1.0000 | ORAL_TABLET | Freq: Every day | ORAL | Status: DC
  Administered 2023-12-03 – 2023-12-12 (×10): 1 via ORAL
  Filled 2023-12-03 (×10): qty 1

## 2023-12-03 MED ORDER — ONDANSETRON HCL 4 MG/2ML IJ SOLN
4.0000 mg | Freq: Once | INTRAMUSCULAR | Status: AC
  Administered 2023-12-03: 4 mg via INTRAVENOUS
  Filled 2023-12-03: qty 2

## 2023-12-03 MED ORDER — DOCUSATE SODIUM 100 MG PO CAPS: 100.0000 mg | ORAL_CAPSULE | Freq: Two times a day (BID) | ORAL | Status: DC | PRN

## 2023-12-03 MED ORDER — DIAZEPAM 5 MG/ML IJ SOLN: 0.0000 mg | INTRAMUSCULAR | Status: DC | PRN

## 2023-12-03 MED ORDER — SODIUM CHLORIDE 0.9 % IV BOLUS
1000.0000 mL | Freq: Once | INTRAVENOUS | Status: AC
  Administered 2023-12-03: 1000 mL via INTRAVENOUS

## 2023-12-03 MED ORDER — CHLORHEXIDINE GLUCONATE CLOTH 2 % EX PADS
6.0000 | MEDICATED_PAD | Freq: Every day | CUTANEOUS | Status: DC
  Administered 2023-12-03 – 2023-12-10 (×8): 6 via TOPICAL

## 2023-12-03 NOTE — Progress Notes (Signed)
 eLink Physician-Brief Progress Note Patient Name: Jake Nguyen DOB: 1965-09-20 MRN: [id number]   Date of Service  12/03/2023  HPI/Events of Note  58 year old presented with acute recurrent hyponatremia secondary to beer potomania and lactic acidosis.    Na goal <120  eICU Interventions  Add free water infusion, D5W at 50 cc an hour   0126 -sodium level 122, increase D5 to 100 cc  0603 - Na 122 still, add DDAVP  x1, electrolyte repletion   Intervention Category Intermediate Interventions: Electrolyte abnormality - evaluation and management  Danyell Shader 12/03/2023, 8:59 PM

## 2023-12-03 NOTE — ED Notes (Signed)
 Pt was provided a urinal for sample. Pt urinated throughout linens and floor. NT and RN changed pt gown and linens.   Pt has a male purewick applied

## 2023-12-03 NOTE — H&P (Signed)
 NAME:  STORMY SABOL, MRN:  [id number], DOB:  Apr 03, 1966, LOS: 0 ADMISSION DATE:  12/03/2023, CONSULTATION DATE:  12/03/2023  REFERRING MD:  EDP, curatalo, CHIEF COMPLAINT: Fall altered mental status  History of Present Illness:  58 year old homeless man, wheelchair-bound due to bilateral LE amputation brought in by EMS after a fall from his [place] bus stop.  Coffee-ground emesis was noted.  On ED arrival his clothes were soiled with dirt, urine and feces .  He has thick texture skin with flaking both his arms .  He admits to drinking heavily. He has a history of recurrent hyponatremia due to chronic alcohol  use. He was admitted 7 18-7/28 for right BKA due to osteomyelitis  Readmitted 7/28 for stump infection .  He was not compliant with antibiotics given at discharge Readmitted 8/3 to 8/13 after falling from wheelchair for sepsis, hypotension .  He did not want to go to SNF and was provided with wound supplies and asked to complete course of antibiotics which I am not sure that he did.  ED vitals significant for tachycardia Labs showed leukocytosis, hyponatremia 109, hypochloremia 71 bicarb 21, anion gap of 17, ionized calcium  0.96, albumin  2.6, lactate 2.9 Discharge sodium on 8/12 was 134  Pertinent  Medical History  right BKA 08/2023, left BKA 2022,  chronic alcohol  use disorder,  anemia of chronic disease,  chronic thrombocytopenia,   anxiety, and depression   Significant Hospital Events: Including procedures, antibiotic start and stop dates in addition to other pertinent events     Interim History / Subjective:  Denies chest pain or dyspnea Let me sleep  Objective    Blood pressure 121/83, pulse (!) 117, temperature 98.1 F (36.7 C), temperature source Rectal, resp. rate (!) 22, SpO2 100%.        Intake/Output Summary (Last 24 hours) at 12/03/2023 1408 Last data filed at 12/03/2023 1323 Gross per 24 hour  Intake 1000 ml  Output --  Net 1000 ml   There  were no vitals filed for this visit.  Examination: General: Disheveled man appears older than stated age, no distress sleeping with loud snoring and apneas HENT: Mild pallor, no icterus, no JVD Lungs: Clear breath sounds bilateral no accessory muscle use Cardiovascular: S1-S2 regular, tacky Abdomen: Soft, nontender Extremities: Right BKA, stump with clean looking wound, left AKA Neuro: Arousable, follows one-step commands, lethargic   Serum EtOH level 83  Resolved problem list   Assessment and Plan    Acute recurrent hyponatremia due to beer potomania His sodium is 108 and he is at risk for seizures, hence we will use hypertonic saline bolus He is urinating well, obtain urine studies to confirm about diagnosis. -Will continue IV fluids with normal saline at 75/h and monitor sodium every 4 hours - Goal sodium by tomorrow morning should be 120 or less  Lactic acidosis -resolved, doubt sepsis syndrome but also doubt that he has completed previous course of antibiotics given for stump infection - Will give him IV Unasyn   Alcohol  intoxication, at risk for withdrawal - Use Ativan  per CIWA protocol  Best Practice (right click and Reselect all SmartList Selections daily)   Diet/type: Regular consistency (see orders) DVT prophylaxis LMWH Pressure ulcer(s): N/A GI prophylaxis: N/A Lines: N/A Foley:  N/A Code Status:  full code Last date of multidisciplinary goals of care discussion [NA]  Labs   CBC: Recent Labs  Lab 12/03/23 1031 12/03/23 1104  WBC 14.8*  --   NEUTROABS 10.5*  --  HGB 11.4* 12.9*  HCT 32.4* 38.0*  MCV 78.3*  --   PLT 338  --     Basic Metabolic Panel: Recent Labs  Lab 12/03/23 1031 12/03/23 1104  NA 109* 108*  K 3.4* 3.4*  CL 71*  --   CO2 21*  --   GLUCOSE 118*  --   BUN 5*  --   CREATININE 0.60*  --   CALCIUM  7.8*  --    GFR: Estimated Creatinine Clearance: 97.4 mL/min (A) (by C-G formula based on SCr of 0.6 mg/dL (L)). Recent Labs   Lab 12/03/23 1031 12/03/23 1046 12/03/23 1313  WBC 14.8*  --   --   LATICACIDVEN  --  2.9* 1.4    Liver Function Tests: Recent Labs  Lab 12/03/23 1031  AST 116*  ALT 29  ALKPHOS 56  BILITOT 1.0  PROT 6.9  ALBUMIN  2.6*   Recent Labs  Lab 12/03/23 1031  LIPASE 21   No results for input(s): AMMONIA in the last 168 hours.  ABG    Component Value Date/Time   HCO3 25.5 12/03/2023 1104   TCO2 27 12/03/2023 1104   O2SAT 72 12/03/2023 1104     Coagulation Profile: No results for input(s): INR, PROTIME in the last 168 hours.  Cardiac Enzymes: No results for input(s): CKTOTAL, CKMB, CKMBINDEX, TROPONINI in the last 168 hours.  HbA1C: Hgb A1c MFr Bld  Date/Time Value Ref Range Status  07/20/2023 08:33 PM 5.6 4.8 - 5.6 % Final    Comment:    (NOTE) Pre diabetes:          5.7%-6.4%  Diabetes:              >6.4%  Glycemic control for   <7.0% adults with diabetes   07/31/2020 10:36 AM 6.6 (H) 4.8 - 5.6 % Final    Comment:    (NOTE) Pre diabetes:          5.7%-6.4%  Diabetes:              >6.4%  Glycemic control for   <7.0% adults with diabetes     CBG: No results for input(s): GLUCAP in the last 168 hours.  Review of Systems:   Unable to obtain since patient is uncooperative  Past Medical History:  He,  has a past medical history of Acute metabolic encephalopathy (11/14/2020), Alcohol  abuse with alcohol -induced mood disorder (HCC) (11/10/2019), Alcohol  withdrawal syndrome, with delirium (HCC) (11/06/2019), Alcoholism (HCC), Anxiety, Depression, Influenza A (05/20/2018), Sepsis (HCC) (11/14/2020), and Septic shock (HCC) (11/24/2020).   Surgical History:   Past Surgical History:  Procedure Laterality Date   ABDOMINAL AORTOGRAM W/LOWER EXTREMITY N/A 08/04/2020   Procedure: ABDOMINAL AORTOGRAM W/LOWER EXTREMITY;  Surgeon: Serene Gaile ORN, MD;  Location: [facility];  Service: Cardiovascular;  Laterality: N/A;   AMPUTATION Left  08/05/2020   Procedure: AMPUTATION BELOW KNEE;  Surgeon: Serene Gaile ORN, MD;  Location[facility] OR;  Service: Vascular;  Laterality: Left;   AMPUTATION Left 11/21/2020   Procedure: REVISION LEFT ABOVE KNEE AMPUTATION;  Surgeon: Serene Gaile ORN, MD;  Location: [facility] OR;  Service: Vascular;  Laterality: Left;   AMPUTATION Right 08/20/2023   Procedure: AMPUTATION BELOW KNEE;  Surgeon: Harden Jerona GAILS, MD;  Location: [facility] OR;  Service: Orthopedics;  Laterality: Right;   APPLICATION OF WOUND VAC Right 08/20/2023   Procedure: APPLICATION, WOUND VAC;  Surgeon: Harden Jerona GAILS, MD;  Location: [facility] OR;  Service: Orthopedics;  Laterality: Right;  Social History:   reports that he has been smoking cigarettes. He has never used smokeless tobacco. He reports current alcohol  use. He reports current drug use. Drug: Marijuana.   Family History:  His Family history is unknown by patient.   Allergies Allergies  Allergen Reactions   Pork-Derived Products Other (See Comments)    Pt states he doesn't eat pork     Home Medications  Prior to Admission medications   Medication Sig Start Date End Date Taking? Authorizing Provider  acetaminophen  (TYLENOL ) 325 MG tablet Take 2 tablets (650 mg total) by mouth every 6 (six) hours as needed for mild pain (pain score 1-3) or fever (or Fever >/= 101). Patient not taking: Reported on 11/12/2023 11/06/23   Drusilla Sabas RAMAN, MD  atorvastatin  (LIPITOR) 40 MG tablet Take 1 tablet (40 mg total) by mouth daily. Patient not taking: Reported on 11/12/2023 09/29/23   Lue Elsie BROCKS, MD  midodrine  (PROAMATINE ) 2.5 MG tablet Take 1 tablet (2.5 mg total) by mouth 3 (three) times daily with meals. 11/22/23 12/22/23  Christobal Guadalajara, MD  Multiple Vitamin (MULTIVITAMIN WITH MINERALS) TABS tablet Take 1 tablet by mouth daily. 11/22/23 12/22/23  Christobal Guadalajara, MD  traMADol  (ULTRAM ) 50 MG tablet Take 1 tablet (50 mg total) by mouth every 8 (eight) hours as needed for up to 18 doses for severe pain (pain  score 7-10). 11/22/23   Christobal Guadalajara, MD     Critical care time: 16m        Harden Staff MD. [facility]. [facility] Pager : [phone]  If no response to pager , please call [phone] until 7 pm After 7:00 pm call Elink  [phone]   12/03/2023

## 2023-12-03 NOTE — ED Triage Notes (Signed)
 Pt bib ems from bus stop c/o fall from wheelchair. PTAR alerted EMS for AMS and syncopal episode.   Coffee ground emesis that started.  Pt d/c 8/13 for skin infection.    BP136/92 HR 108 RR 24 NS 2L 96% ETCO2 24 CBG 133

## 2023-12-03 NOTE — ED Notes (Signed)
 RN and 2 NT cut pt cloths off that was heavily soiled with dirt, urine, and feces. Pt was cleaned with soap, water, and pat dry. Pt has barrier cream applied to inner groin, thighs, and buttocks. Foam sacrum patch applied. New gown and linens provided.

## 2023-12-03 NOTE — ED Provider Notes (Signed)
 [facility] Provider Note   CSN: [id number] Arrival date & time: 12/03/23  9083     Patient presents with: Altered Mental Status, Fall, Emesis, and Wound Infection   Jake Nguyen is a 58 y.o. male.   Patient here with altered mental status, patient was at a bus stop when he fell from his wheelchair.  Has some emesis.  He was discharged recently for skin infection.  Patient arrives with heavily soiled clothing.  Dirty urine and feces throughout.  Dried skin throughout.  He is complaining of nausea but no chest pain shortness of breath.  Admits alcohol  use.  History of alcoholism depression.  Has bilateral lower leg amputation.  Was just treated with antibiotics recently for right lower leg infection.  The history is provided by the patient.       Prior to Admission medications   Medication Sig Start Date End Date Taking? Authorizing Provider  acetaminophen  (TYLENOL ) 325 MG tablet Take 2 tablets (650 mg total) by mouth every 6 (six) hours as needed for mild pain (pain score 1-3) or fever (or Fever >/= 101). Patient not taking: Reported on 11/12/2023 11/06/23   Drusilla Sabas RAMAN, MD  atorvastatin  (LIPITOR) 40 MG tablet Take 1 tablet (40 mg total) by mouth daily. Patient not taking: Reported on 11/12/2023 09/29/23   Lue Elsie BROCKS, MD  midodrine  (PROAMATINE ) 2.5 MG tablet Take 1 tablet (2.5 mg total) by mouth 3 (three) times daily with meals. 11/22/23 12/22/23  Christobal Guadalajara, MD  Multiple Vitamin (MULTIVITAMIN WITH MINERALS) TABS tablet Take 1 tablet by mouth daily. 11/22/23 12/22/23  Christobal Guadalajara, MD  traMADol  (ULTRAM ) 50 MG tablet Take 1 tablet (50 mg total) by mouth every 8 (eight) hours as needed for up to 18 doses for severe pain (pain score 7-10). 11/22/23   Christobal Guadalajara, MD    Allergies: Pork-derived products    Review of Systems  Updated Vital Signs BP 121/83   Pulse (!) 117   Temp 98.1 F (36.7 C) (Rectal)   Resp (!) 22   SpO2 100%    Physical Exam Vitals and nursing note reviewed.  Constitutional:      General: He is not in acute distress.    Appearance: He is well-developed. He is ill-appearing.  HENT:     Head: Normocephalic and atraumatic.     Nose: Nose normal.     Mouth/Throat:     Mouth: Mucous membranes are moist.  Eyes:     General: Scleral icterus present.     Extraocular Movements: Extraocular movements intact.     Conjunctiva/sclera: Conjunctivae normal.     Pupils: Pupils are equal, round, and reactive to light.  Cardiovascular:     Rate and Rhythm: Normal rate and regular rhythm.     Heart sounds: No murmur heard. Pulmonary:     Effort: Pulmonary effort is normal. No respiratory distress.     Breath sounds: Normal breath sounds.  Abdominal:     Palpations: Abdomen is soft.     Tenderness: There is no abdominal tenderness.  Musculoskeletal:        General: No swelling.     Cervical back: Neck supple.  Skin:    General: Skin is warm and dry.     Capillary Refill: Capillary refill takes less than 2 seconds.     Comments: Excoriated skin throughout, right BKA site with no obvious purulence or fluctuance  Neurological:     General: No focal deficit  present.     Mental Status: He is alert.  Psychiatric:        Mood and Affect: Mood normal.     (all labs ordered are listed, but only abnormal results are displayed) Labs Reviewed  CBC WITH DIFFERENTIAL/PLATELET - Abnormal; Notable for the following components:      Result Value   WBC 14.8 (*)    RBC 4.14 (*)    Hemoglobin 11.4 (*)    HCT 32.4 (*)    MCV 78.3 (*)    Neutro Abs 10.5 (*)    Monocytes Absolute 1.8 (*)    Abs Immature Granulocytes 0.14 (*)    All other components within normal limits  COMPREHENSIVE METABOLIC PANEL WITH GFR - Abnormal; Notable for the following components:   Sodium 109 (*)    Potassium 3.4 (*)    Chloride 71 (*)    CO2 21 (*)    Glucose, Bld 118 (*)    BUN 5 (*)    Creatinine, Ser 0.60 (*)    Calcium   7.8 (*)    Albumin  2.6 (*)    AST 116 (*)    Anion gap 17 (*)    All other components within normal limits  I-STAT CG4 LACTIC ACID, ED - Abnormal; Notable for the following components:   Lactic Acid, Venous 2.9 (*)    All other components within normal limits  I-STAT VENOUS BLOOD GAS, ED - Abnormal; Notable for the following components:   Sodium 108 (*)    Potassium 3.4 (*)    Calcium , Ion 0.96 (*)    HCT 38.0 (*)    Hemoglobin 12.9 (*)    All other components within normal limits  LIPASE, BLOOD  ETHANOL  RAPID URINE DRUG SCREEN, HOSP PERFORMED  URINALYSIS, ROUTINE W REFLEX MICROSCOPIC  I-STAT CG4 LACTIC ACID, ED    EKG: EKG Interpretation Date/Time:  Sunday December 03 2023 09:48:16 EDT Ventricular Rate:  107 PR Interval:  154 QRS Duration:  92 QT Interval:  353 QTC Calculation: 471 R Axis:   85  Text Interpretation: Sinus tachycardia Confirmed by Ruthe Cornet [phone]) on 12/03/2023 9:50:45 AM  Radiology: CT Head Wo Contrast Result Date: 12/03/2023 CLINICAL DATA:  Patient fell from wheelchair. Altered mental status and syncope. EXAM: CT HEAD WITHOUT CONTRAST CT CERVICAL SPINE WITHOUT CONTRAST TECHNIQUE: Multidetector CT imaging of the head and cervical spine was performed following the standard protocol without intravenous contrast. Multiplanar CT image reconstructions of the cervical spine were also generated. RADIATION DOSE REDUCTION: This exam was performed according to the departmental dose-optimization program which includes automated exposure control, adjustment of the mA and/or kV according to patient size and/or use of iterative reconstruction technique. COMPARISON:  Head CT 11/12/2023.  CT cervical spine 09/17/2023. FINDINGS: CT HEAD FINDINGS Brain: There is no evidence for acute hemorrhage, hydrocephalus, mass lesion, or abnormal extra-axial fluid collection. No definite CT evidence for acute infarction. Diffuse loss of parenchymal volume is consistent with atrophy.  Vascular: No hyperdense vessel or unexpected calcification. Skull: No evidence for fracture. No worrisome lytic or sclerotic lesion. Sinuses/Orbits: The visualized paranasal sinuses and mastoid air cells are clear. Visualized portions of the globes and intraorbital fat are unremarkable. Other: None. CT CERVICAL SPINE FINDINGS Alignment: Reversal of normal cervical lordosis. No evidence for traumatic subluxation. Skull base and vertebrae: No acute fracture. No primary bone lesion or focal pathologic process. Soft tissues and spinal canal: No prevertebral fluid or swelling. No visible canal hematoma. Disc levels:  Preserved throughout. Upper  chest: Paraseptal emphysema noted in the lung apices. Other: None. IMPRESSION: 1. No acute intracranial abnormality. 2. No evidence for cervical spine fracture or traumatic subluxation. 3. Reversal of normal cervical lordosis. This can be related to patient positioning, muscle spasm or soft tissue injury. 4.  Emphysema (ICD10-J43.9). Electronically Signed   By: Camellia Candle M.D.   On: 12/03/2023 13:24   CT Cervical Spine Wo Contrast Result Date: 12/03/2023 CLINICAL DATA:  Patient fell from wheelchair. Altered mental status and syncope. EXAM: CT HEAD WITHOUT CONTRAST CT CERVICAL SPINE WITHOUT CONTRAST TECHNIQUE: Multidetector CT imaging of the head and cervical spine was performed following the standard protocol without intravenous contrast. Multiplanar CT image reconstructions of the cervical spine were also generated. RADIATION DOSE REDUCTION: This exam was performed according to the departmental dose-optimization program which includes automated exposure control, adjustment of the mA and/or kV according to patient size and/or use of iterative reconstruction technique. COMPARISON:  Head CT 11/12/2023.  CT cervical spine 09/17/2023. FINDINGS: CT HEAD FINDINGS Brain: There is no evidence for acute hemorrhage, hydrocephalus, mass lesion, or abnormal extra-axial fluid collection.  No definite CT evidence for acute infarction. Diffuse loss of parenchymal volume is consistent with atrophy. Vascular: No hyperdense vessel or unexpected calcification. Skull: No evidence for fracture. No worrisome lytic or sclerotic lesion. Sinuses/Orbits: The visualized paranasal sinuses and mastoid air cells are clear. Visualized portions of the globes and intraorbital fat are unremarkable. Other: None. CT CERVICAL SPINE FINDINGS Alignment: Reversal of normal cervical lordosis. No evidence for traumatic subluxation. Skull base and vertebrae: No acute fracture. No primary bone lesion or focal pathologic process. Soft tissues and spinal canal: No prevertebral fluid or swelling. No visible canal hematoma. Disc levels:  Preserved throughout. Upper chest: Paraseptal emphysema noted in the lung apices. Other: None. IMPRESSION: 1. No acute intracranial abnormality. 2. No evidence for cervical spine fracture or traumatic subluxation. 3. Reversal of normal cervical lordosis. This can be related to patient positioning, muscle spasm or soft tissue injury. 4.  Emphysema (ICD10-J43.9). Electronically Signed   By: Camellia Candle M.D.   On: 12/03/2023 13:24   CT ABDOMEN PELVIS W CONTRAST Result Date: 12/03/2023 EXAM: CT ABDOMEN AND PELVIS WITH CONTRAST 12/03/2023 12:45:37 PM TECHNIQUE: CT of the abdomen and pelvis was performed with the administration of intravenous contrast (75mL iohexol  (OMNIPAQUE ) 350 MG/ML injection). Multiplanar reformatted images are provided for review. Automated exposure control, iterative reconstruction, and/or weight-based adjustment of the mA/kV was utilized to reduce the radiation dose to as low as reasonably achievable. COMPARISON: None available. CLINICAL HISTORY: Abdominal pain, acute, nonlocalized. Pt bib ems from bus stop c/o fall from wheelchair. PTAR alerted EMS for AMS and syncopal episode. FINDINGS: LOWER CHEST: Circumferential thickening of the visualized distal esophagus. Small hiatal  hernia. LIVER: Normal size and contour. GALLBLADDER AND BILE DUCTS: Several small calcified gallstones layer at the dependent aspect of the nondilated gallbladder, without adjacent inflammatory change. SPLEEN: Normal size. No focal lesion. PANCREAS: No mass. No ductal dilatation. ADRENAL GLANDS: Normal appearance. No mass. KIDNEYS, URETERS AND BLADDER: No stones in the kidneys or ureters. No hydronephrosis. No perinephric or periureteral stranding. Urinary bladder distended. GI AND BOWEL: Stomach demonstrates no acute abnormality. There is no bowel obstruction. No bowel wall thickening. PERITONEUM AND RETROPERITONEUM: No ascites. No free air. VASCULATURE: Scattered calcified aortic plaque without aneurysm. LYMPH NODES: Mildly enlarged bilateral inguinal lymph nodes right greater than left, nonspecific. REPRODUCTIVE ORGANS: No significant abnormality. BONES AND SOFT TISSUES: Mild degenerative disc disease at L4-5. No acute  osseous abnormality. No focal soft tissue abnormality. IMPRESSION: 1. No acute findings in the abdomen or pelvis. 2. Circumferential thickening of the visualized distal esophagus and small hiatal hernia. 3. Cholelithiasis Electronically signed by: Katheleen Faes MD 12/03/2023 01:07 PM EDT RP Workstation: [id number]   DG Chest Portable 1 View Result Date: 12/03/2023 EXAM: 1 VIEW XRAY OF THE CHEST 12/03/2023 10:05:00 AM COMPARISON: 11/12/2023 CLINICAL HISTORY: AMS. Ordered for AMS; Per triage notes: from bus stop c/o fall from wheelchair. PTAR alerted EMS for AMS and syncopal episode. Coffee ground emesis that started. Pt d/c 8/13 for skin infection. FINDINGS: LUNGS AND PLEURA: Improved aeration in the lung bases. No focal pulmonary opacity. No pulmonary edema. No pleural effusion. No pneumothorax. HEART AND MEDIASTINUM: No acute abnormality of the cardiac and mediastinal silhouettes. BONES AND SOFT TISSUES: No acute osseous abnormality. IMPRESSION: 1. Improved aeration in the lung bases.  Electronically signed by: Katheleen Faes MD 12/03/2023 10:38 AM EDT RP Workstation: [id number]     .Critical Care  Performed by: Ruthe Cornet, DO Authorized by: Ruthe Cornet, DO   Critical care provider statement:    Critical care time (minutes):  35   Critical care was necessary to treat or prevent imminent or life-threatening deterioration of the following conditions: hyponatremia.   Critical care was time spent personally by me on the following activities:  Blood draw for specimens, development of treatment plan with patient or surrogate, discussions with primary provider, examination of patient, evaluation of patient's response to treatment, obtaining history from patient or surrogate, ordering and performing treatments and interventions, ordering and review of laboratory studies, ordering and review of radiographic studies, pulse oximetry, re-evaluation of patient's condition and review of old charts   Care discussed with: admitting provider      Medications Ordered in the ED  ondansetron  (ZOFRAN -ODT) disintegrating tablet 4 mg (4 mg Oral Not Given 12/03/23 1036)  sodium chloride  0.9 % bolus 1,000 mL (0 mLs Intravenous Stopped 12/03/23 1323)  ondansetron  (ZOFRAN ) injection 4 mg (4 mg Intravenous Given 12/03/23 1034)  iohexol  (OMNIPAQUE ) 350 MG/ML injection 75 mL (75 mLs Intravenous Contrast Given 12/03/23 1241)                                    Medical Decision Making Amount and/or Complexity of Data Reviewed Labs: ordered. Radiology: ordered.  Risk Prescription drug management. Decision regarding hospitalization.   Jake Nguyen is here with altered mental status after a fall out of his wheelchair at a bus stop.  Not sure if he lost consciousness or not.  He had emesis.  History of alcohol  use.  Just discharged recently for infection got antibiotics for right lower leg wound.  He admits to alcohol  use.  He denies any pain.  He was maybe at a shelter where he eats and gets  his food.  He has been using a wheelchair but does not know where it is now.  He is very unkept he is covered in urine and feces.  Clothing was removed.  Patient was washed.  Do not see any major infectious process going on his right lower leg or elsewhere.  He is got excoriated skin throughout.  Ultimately will check basic labs get a CT scan of his head neck abdomen pelvis.  He seems nauseous here but has not thrown up.  The vomit with EMS might of look dark.  Does have a history of alcoholism.  I  do not see anywhere in his chart where he has had an EGD or GI bleed in the past.  Differential diagnosis likely alcohol  intoxication versus dehydration versus possibly infectious process.  Will evaluate with labs and imaging.  Will give Zofran  IV fluids and reevaluate.  Overall lab work concerning with hyponatremia at 109.  I do suspect that this is likely from dehydration and alcohol  use.  Lactic acid was 2.9 and then 1.4.  Has a mild leukocytosis of 14.  But I do not think there is any obvious infectious process on exam.  pH was unremarkable.  Potassium is 3.4.  Creatinine normal.  CT scans of his head neck and abdomen are unremarkable.  No acute findings.  Overall talked to ICU about his hyponatremia.  Will admit to their service.  This chart was dictated using voice recognition software.  Despite best efforts to proofread,  errors can occur which can change the documentation meaning.      Final diagnoses:  Hyponatremia    ED Discharge Orders     None          Ruthe Cornet, DO 12/03/23 1352

## 2023-12-03 NOTE — Consult Note (Signed)
 WOC Nurse Consult Note: Reason for Consult; sacral wound; MASD Wound type: ICD; irritant contact dermatitis ICD-10 CM Codes for Irritant Dermatitis  L24A2 - Due to fecal, urinary or dual incontinence Pressure Injury POA: NA Measurement: see nursing flow sheet Wound bed:100% pink; right posterior thigh larger area; scattered area over the left posterior thigh Drainage (amount, consistency, odor) see nursing flow sheet Periwound: intact  Dressing procedure/placement/frequency: Gerhardt's to the left thigh BID Xeroform to the right posterior thigh, top with foam. Change daily.    Re consult if needed, will not follow at this time. Thanks  Abigael Mogle [employer], RN,CWOCN, CNS, [employer] [phone]

## 2023-12-03 NOTE — ED Notes (Signed)
 Pt hard stick. Pt has various skin flaking and rough texture skin along both arms.

## 2023-12-04 DIAGNOSIS — F1010 Alcohol abuse, uncomplicated: Secondary | ICD-10-CM

## 2023-12-04 DIAGNOSIS — I4891 Unspecified atrial fibrillation: Secondary | ICD-10-CM

## 2023-12-04 DIAGNOSIS — I480 Paroxysmal atrial fibrillation: Secondary | ICD-10-CM

## 2023-12-04 DIAGNOSIS — E876 Hypokalemia: Secondary | ICD-10-CM

## 2023-12-04 LAB — POTASSIUM: Potassium: 3.6 mmol/L (ref 3.5–5.1)

## 2023-12-04 LAB — CBC
HCT: 28.6 % — ABNORMAL LOW (ref 39.0–52.0)
Hemoglobin: 10.1 g/dL — ABNORMAL LOW (ref 13.0–17.0)
MCH: 27.9 pg (ref 26.0–34.0)
MCHC: 35.3 g/dL (ref 30.0–36.0)
MCV: 79 fL — ABNORMAL LOW (ref 80.0–100.0)
Platelets: 277 K/uL (ref 150–400)
RBC: 3.62 MIL/uL — ABNORMAL LOW (ref 4.22–5.81)
RDW: 13.8 % (ref 11.5–15.5)
WBC: 7.9 K/uL (ref 4.0–10.5)
nRBC: 0 % (ref 0.0–0.2)

## 2023-12-04 LAB — BASIC METABOLIC PANEL WITH GFR
Anion gap: 9 (ref 5–15)
Anion gap: 9 (ref 5–15)
BUN: <5 mg/dL — ABNORMAL LOW (ref 6–20)
BUN: <5 mg/dL — ABNORMAL LOW (ref 6–20)
CO2: 27 mmol/L (ref 22–32)
CO2: 25 mmol/L (ref 22–32)
Calcium: 7.4 mg/dL — ABNORMAL LOW (ref 8.9–10.3)
Calcium: 7.3 mg/dL — ABNORMAL LOW (ref 8.9–10.3)
Chloride: 86 mmol/L — ABNORMAL LOW (ref 98–111)
Chloride: 87 mmol/L — ABNORMAL LOW (ref 98–111)
Creatinine, Ser: 0.59 mg/dL — ABNORMAL LOW (ref 0.61–1.24)
Creatinine, Ser: 0.5 mg/dL — ABNORMAL LOW (ref 0.61–1.24)
GFR, Estimated: >60 mL/min (ref >60)
GFR, Estimated: >60 mL/min (ref >60)
Glucose, Bld: 128 mg/dL — ABNORMAL HIGH (ref 70–99)
Glucose, Bld: 133 mg/dL — ABNORMAL HIGH (ref 70–99)
Potassium: 2.5 mmol/L — CL (ref 3.5–5.1)
Potassium: 3.6 mmol/L (ref 3.5–5.1)
Sodium: 122 mmol/L — ABNORMAL LOW (ref 135–145)
Sodium: 121 mmol/L — ABNORMAL LOW (ref 135–145)

## 2023-12-04 LAB — PHOSPHORUS: Phosphorus: 2.8 mg/dL (ref 2.5–4.6)

## 2023-12-04 LAB — GLUCOSE, CAPILLARY
Glucose-Capillary: 119 mg/dL — ABNORMAL HIGH (ref 70–99)
Glucose-Capillary: 130 mg/dL — ABNORMAL HIGH (ref 70–99)
Glucose-Capillary: 135 mg/dL — ABNORMAL HIGH (ref 70–99)
Glucose-Capillary: 142 mg/dL — ABNORMAL HIGH (ref 70–99)
Glucose-Capillary: 142 mg/dL — ABNORMAL HIGH (ref 70–99)
Glucose-Capillary: 147 mg/dL — ABNORMAL HIGH (ref 70–99)

## 2023-12-04 LAB — SODIUM
Sodium: 122 mmol/L — ABNORMAL LOW (ref 135–145)
Sodium: 124 mmol/L — ABNORMAL LOW (ref 135–145)
Sodium: 124 mmol/L — ABNORMAL LOW (ref 135–145)

## 2023-12-04 LAB — MAGNESIUM
Magnesium: 1.8 mg/dL (ref 1.7–2.4)
Magnesium: 1.7 mg/dL (ref 1.7–2.4)

## 2023-12-04 MED ORDER — MAGNESIUM SULFATE 2 GM/50ML IV SOLN
2.0000 g | Freq: Once | INTRAVENOUS | Status: AC
  Administered 2023-12-04: 2 g via INTRAVENOUS
  Filled 2023-12-04: qty 50

## 2023-12-04 MED ORDER — DESMOPRESSIN ACETATE 4 MCG/ML IJ SOLN
2.0000 ug | Freq: Once | INTRAMUSCULAR | Status: AC
  Administered 2023-12-04: 2 ug via INTRAVENOUS
  Filled 2023-12-04: qty 1

## 2023-12-04 MED ORDER — POTASSIUM CHLORIDE 10 MEQ/100ML IV SOLN
10.0000 meq | INTRAVENOUS | Status: AC
  Administered 2023-12-04 (×2): 10 meq via INTRAVENOUS
  Filled 2023-12-04 (×2): qty 100

## 2023-12-04 MED ORDER — ENOXAPARIN SODIUM 40 MG/0.4ML IJ SOSY
40.0000 mg | PREFILLED_SYRINGE | INTRAMUSCULAR | Status: DC
  Administered 2023-12-04 – 2023-12-08 (×5): 40 mg via SUBCUTANEOUS
  Filled 2023-12-04 (×5): qty 0.4

## 2023-12-04 MED ORDER — POLYETHYLENE GLYCOL 3350 17 G PO PACK
17.0000 g | PACK | Freq: Two times a day (BID) | ORAL | Status: DC
  Administered 2023-12-04 – 2023-12-05 (×3): 17 g via ORAL
  Filled 2023-12-04 (×4): qty 1

## 2023-12-04 MED ORDER — MIDODRINE HCL 5 MG PO TABS: 2.5000 mg | ORAL_TABLET | Freq: Three times a day (TID) | ORAL | Status: DC

## 2023-12-04 MED ORDER — POTASSIUM CHLORIDE 10 MEQ/100ML IV SOLN
10.0000 meq | INTRAVENOUS | Status: AC
  Administered 2023-12-04 (×6): 10 meq via INTRAVENOUS
  Filled 2023-12-04 (×6): qty 100

## 2023-12-04 MED ORDER — AMOXICILLIN-POT CLAVULANATE 875-125 MG PO TABS
1.0000 | ORAL_TABLET | Freq: Two times a day (BID) | ORAL | Status: AC
  Administered 2023-12-04 – 2023-12-07 (×8): 1 via ORAL
  Filled 2023-12-04 (×8): qty 1

## 2023-12-04 MED ORDER — AMIODARONE IV BOLUS ONLY 150 MG/100ML: 150.0000 mg | Freq: Once | INTRAVENOUS | Status: DC

## 2023-12-04 MED ORDER — POTASSIUM CHLORIDE 20 MEQ PO PACK
40.0000 meq | PACK | ORAL | Status: AC
  Administered 2023-12-04 (×2): 40 meq via ORAL
  Filled 2023-12-04 (×2): qty 2

## 2023-12-04 MED ORDER — DOXYCYCLINE HYCLATE 100 MG PO TABS
100.0000 mg | ORAL_TABLET | Freq: Two times a day (BID) | ORAL | Status: AC
  Administered 2023-12-04 – 2023-12-07 (×8): 100 mg via ORAL
  Filled 2023-12-04 (×8): qty 1

## 2023-12-04 MED ORDER — HYDROXYZINE HCL 25 MG PO TABS
25.0000 mg | ORAL_TABLET | Freq: Three times a day (TID) | ORAL | Status: DC | PRN
  Administered 2023-12-04 – 2023-12-11 (×9): 25 mg via ORAL
  Filled 2023-12-04 (×9): qty 1

## 2023-12-04 MED ORDER — DOCUSATE SODIUM 100 MG PO CAPS
100.0000 mg | ORAL_CAPSULE | Freq: Two times a day (BID) | ORAL | Status: DC
  Administered 2023-12-04 – 2023-12-05 (×3): 100 mg via ORAL
  Filled 2023-12-04 (×4): qty 1

## 2023-12-04 MED ORDER — POTASSIUM CHLORIDE CRYS ER 20 MEQ PO TBCR
40.0000 meq | EXTENDED_RELEASE_TABLET | Freq: Once | ORAL | Status: AC
  Administered 2023-12-04: 40 meq via ORAL
  Filled 2023-12-04: qty 2

## 2023-12-04 NOTE — Progress Notes (Signed)
 NAME:  Jake Nguyen, MRN:  [id number], DOB:  09-24-1965, LOS: 1 ADMISSION DATE:  12/03/2023, CONSULTATION DATE:  12/04/2023  REFERRING MD:  EDP, curatalo, CHIEF COMPLAINT: Fall altered mental status  History of Present Illness:  58 year old homeless man, wheelchair-bound due to bilateral LE amputation brought in by EMS after a fall from his wheelchair at the bus stop.  Coffee-ground emesis was noted.  On ED arrival his clothes were soiled with dirt, urine and feces .  He has thick texture skin with flaking both his arms .  He admits to drinking heavily. He has a history of recurrent hyponatremia due to chronic alcohol  use. He was admitted 7 18-7/28 for right BKA due to osteomyelitis  Readmitted 7/28 for stump infection .  He was not compliant with antibiotics given at discharge Readmitted 8/3 to 8/13 after falling from wheelchair for sepsis, hypotension .  He did not want to go to SNF and was provided with wound supplies and asked to complete course of antibiotics which I am not sure that he did.  ED vitals significant for tachycardia Labs showed leukocytosis, hyponatremia 109, hypochloremia 71 bicarb 21, anion gap of 17, ionized calcium  0.96, albumin  2.6, lactate 2.9 Discharge sodium on 8/12 was 134  Pertinent  Medical History  right BKA 08/2023, left BKA 2022,  chronic alcohol  use disorder,  anemia of chronic disease,  chronic thrombocytopenia,   anxiety, and depression   Significant Hospital Events: Including procedures, antibiotic start and stop dates in addition to other pertinent events   12/03/2023: Admitted to ICU for management of hyponatremia  12/04/2023: Off hypertonics due to quick correction of sodium   Interim History / Subjective:  Complains of feeling horrible due to nausea/vomiting and diffuse abdominal pain. Has not had a bowel movement since a few days prior to admission.   Objective    Blood pressure (!) 100/53, pulse 99, temperature 98.4 F (36.9 C), resp. rate  20, weight 67.4 kg, SpO2 94%.        Intake/Output Summary (Last 24 hours) at 12/04/2023 1047 Last data filed at 12/04/2023 1000 Gross per 24 hour  Intake 2591.44 ml  Output 2000 ml  Net 591.44 ml   Filed Weights   12/04/23 0500  Weight: 67.4 kg    Examination: General: Chronically ill appearing gentleman HENT: pale, no icterus, no JVD Lungs: clear breath sounds bilaterally, breathing comfortably on room air  Cardiovascular: tachycardic, regular, normal S1 and S2, no murmurs, rubs or gallops Abdomen: soft, diffusely tender, no rebound or guarding, normoactive bowel sounds Extremities: s/p bilateral AKA, right stump wound without erythema or drainage Neuro: AAOx4, follows commands throughout  Hypokalemic this morning to 2.5  Na now 122 Cr stable 0.59   Resolved problem list   Assessment and Plan   Acute hypotonic hyponatremia Patient has a history of recurrent hyponatremia in the setting of chronic alcohol  abuse. Serum Osm 252, urine Osm 129, urine Na<10. Na on admission was 109 now 122 with hypertonic saline administration. Likely due to beer potomania.  - Neurologically intact, no evidence of OSD in the setting of rapid sodium correction  - Stop D5 now that his Na>120 - Trend Na q6h  Atrial fibrillation with RVR  Hypokalemia  Hypomagnesemia  Short run of Afib RVR on 8/25 which was self-limited likely due to electrolyte disturbances. - Continue to trend electrolytes and replete aggressively   Lactic acidosis, resolved Suspect likely due to hypovolemia in the setting of poor PO intake. Less likely due to  sepsis, however, last admission he was given a course of PO antibiotics for stump infection and unclear if he completed.  - Stop IV Unasyn  and resume Augmentin  and doxycycline  to complete 7 day course   Alcohol  abuse Alcohol  level on admission 83.  - Continue CIWA protocol - Continue thiamine  and multivitamin   Best Practice (right click and Reselect all SmartList  Selections daily)   Diet/type: Regular consistency (see orders) DVT prophylaxis LMWH Pressure ulcer(s): N/A GI prophylaxis: N/A Lines: N/A Foley:  N/A Code Status:  full code  Labs   CBC: Recent Labs  Lab 12/03/23 1031 12/03/23 1104 12/04/23 0447  WBC 14.8*  --  7.9  NEUTROABS 10.5*  --   --   HGB 11.4* 12.9* 10.1*  HCT 32.4* 38.0* 28.6*  MCV 78.3*  --  79.0*  PLT 338  --  277    Basic Metabolic Panel: Recent Labs  Lab 12/03/23 1031 12/03/23 1104 12/03/23 1746 12/03/23 1951 12/04/23 0010 12/04/23 0447  NA 111*  109* 108* 119* 121* 122* 122*  K 3.4* 3.4*  --   --   --  2.5*  CL 71*  --   --   --   --  86*  CO2 21*  --   --   --   --  27  GLUCOSE 118*  --   --   --   --  128*  BUN 5*  --   --   --   --  <5*  CREATININE 0.60*  --   --   --   --  0.59*  CALCIUM  7.8*  --   --   --   --  7.4*  MG  --   --   --   --   --  1.8  PHOS  --   --   --   --   --  2.8   GFR: Estimated Creatinine Clearance: 98.3 mL/min (A) (by C-G formula based on SCr of 0.59 mg/dL (L)). Recent Labs  Lab 12/03/23 1031 12/03/23 1046 12/03/23 1313 12/04/23 0447  WBC 14.8*  --   --  7.9  LATICACIDVEN  --  2.9* 1.4  --     Liver Function Tests: Recent Labs  Lab 12/03/23 1031  AST 116*  ALT 29  ALKPHOS 56  BILITOT 1.0  PROT 6.9  ALBUMIN  2.6*   Recent Labs  Lab 12/03/23 1031  LIPASE 21   No results for input(s): AMMONIA in the last 168 hours.  ABG    Component Value Date/Time   HCO3 25.5 12/03/2023 1104   TCO2 27 12/03/2023 1104   O2SAT 72 12/03/2023 1104     Coagulation Profile: No results for input(s): INR, PROTIME in the last 168 hours.  Cardiac Enzymes: No results for input(s): CKTOTAL, CKMB, CKMBINDEX, TROPONINI in the last 168 hours.  HbA1C: Hgb A1c MFr Bld  Date/Time Value Ref Range Status  07/20/2023 08:33 PM 5.6 4.8 - 5.6 % Final    Comment:    (NOTE) Pre diabetes:          5.7%-6.4%  Diabetes:              >6.4%  Glycemic control  for   <7.0% adults with diabetes   07/31/2020 10:36 AM 6.6 (H) 4.8 - 5.6 % Final    Comment:    (NOTE) Pre diabetes:          5.7%-6.4%  Diabetes:              >  6.4%  Glycemic control for   <7.0% adults with diabetes     CBG: Recent Labs  Lab 12/03/23 1530 12/03/23 2017 12/04/23 0005 12/04/23 0406 12/04/23 0739  GLUCAP 137* 121* 119* 135* 142*      Rexene LOISE Blush, [place] [facility] 12/04/23 10:48 AM  Please see [email or web address] for pager details.  From 7A-7P if no response, please call [phone] After hours, please call ELink [phone]

## 2023-12-04 NOTE — Evaluation (Signed)
 Physical Therapy Evaluation Patient Details Name: Jake Nguyen MRN: [id number] DOB: 1966-03-29 Today's Date: 12/04/2023  History of Present Illness  Pt is a 58 y/o male presenting after a fall out of his wheelchair at a bus stop. Etoh level 83 on arrival. Admitted for acute recurrent hyponatremia due to beer potomania and lactic acidosis. PMH: R BKA, L AKA, etoh use disorder, anemia, anxiety, depression and chronic thrombocytopenia.  Clinical Impression  Pt known to PT from prior hospitalization. Pt continues to have poor safety awareness, reporting that he has not been getting out of his wheelchair at all lately and now has wounds on the backs of his legs and his sacrum and back. Pt initially refusing to work with therapy but is agreeable to rolling to remove wet bed pad underneath him. Pt requires mod A for rolling R and L. Pt refusing to come to EoB, PT did not push as BP in supine 84/57. Given the extent of wounds and lack of mobility pt will likely require long term placement with PT if he will participate. PT will continue to follow acutely.         Can travel by private vehicle   No    Equipment Recommendations Wheelchair (measurements PT);Wheelchair cushion (measurements PT)     Functional Status Assessment Patient has had a recent decline in their functional status and demonstrates the ability to make significant improvements in function in a reasonable and predictable amount of time.     Precautions / Restrictions Precautions Precautions: Fall Recall of Precautions/Restrictions: Impaired Restrictions Weight Bearing Restrictions Per Provider Order: No Other Position/Activity Restrictions: R BKA, L AKA      Mobility  Bed Mobility Overal bed mobility: Needs Assistance Bed Mobility: Rolling Rolling: Mod assist         General bed mobility comments: pt requiring modA for rolling R and L to remove sweat soaked pad, mod A for rolling L and R to achieve sidelying to tuck  wet pad and roll off of it    Transfers                   General transfer comment: pt declined       Balance       Sitting balance - Comments: refuses to get up to EoB                                     Pertinent Vitals/Pain Pain Assessment Pain Assessment: Faces Faces Pain Scale: Hurts even more Pain Location: back, R posterior lateral thigh, sacrum, L posterior thigh Pain Descriptors / Indicators: Discomfort, Guarding Pain Intervention(s): Limited activity within patient's tolerance, Monitored during session, Repositioned    Home Living Family/patient expects to be discharged to:: Shelter/Homeless                   Additional Comments: homeless, reports a couple of recent falls    Prior Function Prior Level of Function : History of Falls (last six months)             Mobility Comments: AP transfer to w/c but reports not getting out of wheelchair much ADLs Comments: Reports indep with ADLs though unsure of accuracy d/t poor overall hygiene mgmt d/t housing insecurity     Extremity/Trunk Assessment   Upper Extremity Assessment Upper Extremity Assessment: Defer to OT evaluation    Lower Extremity Assessment Lower Extremity Assessment: RLE deficits/detail;LLE  deficits/detail RLE Deficits / Details: Recent R BKA,  hip flexion 3/5, knee flex 90 degrees, knee ext lacks ~10 degrees of extension, wounds all along posterior of hip and thigh, LLE Deficits / Details: L AKA, wounds to posterior thigh and buttock    Cervical / Trunk Assessment Cervical / Trunk Assessment: Normal  Communication   Communication Communication: No apparent difficulties    Cognition Arousal: Alert Behavior During Therapy: WFL for tasks assessed/performed, Agitated   PT - Cognitive impairments: Problem solving, Safety/Judgement                       PT - Cognition Comments: pt with increased wounds on posterior of his body, reporting that he  has not been getting out of his chair, thinks maybe that might have something to do with all of his wounds. Following commands: Intact       Cueing Cueing Techniques: Verbal cues     General Comments General comments (skin integrity, edema, etc.): poor posterior body skin integrity, covered with multiple foam dressings some saturated with drainage. BP 84/57, HR 88, 96%RA    Exercises Amputee Exercises Hip Flexion/Marching: AAROM, Right, 5 reps, Supine Knee Flexion: AAROM, Right, 5 reps, Supine   Assessment/Plan    PT Assessment Patient needs continued PT services  PT Problem List Decreased skin integrity;Pain;Impaired sensation;Decreased strength;Decreased range of motion;Decreased activity tolerance;Decreased mobility;Decreased safety awareness       PT Treatment Interventions DME instruction;Functional mobility training;Therapeutic activities;Therapeutic exercise;Balance training;Cognitive remediation;Wheelchair mobility training    PT Goals (Current goals can be found in the Care Plan section)  Acute Rehab PT Goals Patient Stated Goal: not stated PT Goal Formulation: With patient Time For Goal Achievement: 12/18/23 Potential to Achieve Goals: Poor    Frequency Min 1X/week        AM-PAC PT 6 Clicks Mobility  Outcome Measure Help needed turning from your back to your side while in a flat bed without using bedrails?: A Lot Help needed moving from lying on your back to sitting on the side of a flat bed without using bedrails?: Total Help needed moving to and from a bed to a chair (including a wheelchair)?: Total Help needed standing up from a chair using your arms (e.g., wheelchair or bedside chair)?: Total Help needed to walk in hospital room?: Total Help needed climbing 3-5 steps with a railing? : Total 6 Click Score: 7    End of Session   Activity Tolerance: Patient limited by pain;Other (comment) (self limiting) Patient left: with call bell/phone within  reach;with bed alarm set Nurse Communication: Mobility status PT Visit Diagnosis: Muscle weakness (generalized) (M62.81);Adult, failure to thrive  (R62.7);Pain Pain - part of body:  (posterior body, bilateral thighs, sacrum and back)    Time: 8450-8394 PT Time Calculation (min) (ACUTE ONLY): 16 min   Charges:   PT Evaluation $PT Eval Moderate Complexity: 1 Mod   PT General Charges $$ ACUTE PT VISIT: 1 Visit         Hyun Marsalis B. Fleeta Lapidus PT, DPT Acute Rehabilitation Services Please use secure chat or  Call Office [phone]   Almarie KATHEE Fleeta Fleet 12/04/2023, 4:21 PM

## 2023-12-04 NOTE — Evaluation (Signed)
 Occupational Therapy Evaluation Patient Details Name: Jake Nguyen MRN: [id number] DOB: 25-Nov-1965 Today's Date: 12/04/2023   History of Present Illness   Pt is a 58 y/o male presenting after a fall out of his wheelchair at a bus stop. Etoh level 83 on arrival. Admitted for acute recurrent hyponatremia due to beer potomania and lactic acidosis. PMH: R BKA, L AKA, etoh use disorder, anemia, anxiety, depression and chronic thrombocytopenia.     Clinical Impressions PTA, pt with housing insecurity, wheelchair bound (completes AP transfers at baseline) and reports able to manage ADLs though difficult with current housing situation. Pt with decreased intrinsic motivation for mobility so OT eval limited today. Pt able to assist with bed mobility aspects w/ limited assist though deferred EOB. Pt requiring Setup-Mod A for ADLs assessed bed level today. Noted pt declined need for post acute rehab during prior hospitalization. Anticipate pt likely near baseline without need for postacute OT follow up but will continue to follow while admitted to progress independence/mobility as able.   HR 100s, SpO2 WFL on RA     If plan is discharge home, recommend the following:   Assistance with cooking/housework;Direct supervision/assist for medications management;Direct supervision/assist for financial management;Assist for transportation     Functional Status Assessment   Patient has had a recent decline in their functional status and demonstrates the ability to make significant improvements in function in a reasonable and predictable amount of time.     Equipment Recommendations   Other (comment) (May need new wheelchair and wheelchair cushion as his was left at bus stop when brought in by EMS)     Recommendations for Other Services         Precautions/Restrictions   Precautions Precautions: Fall Recall of Precautions/Restrictions: Impaired Restrictions Weight Bearing Restrictions Per  Provider Order: No Other Position/Activity Restrictions: R BKA, L AKA     Mobility Bed Mobility Overal bed mobility: Needs Assistance Bed Mobility: Rolling Rolling: Modified independent (Device/Increase time)         General bed mobility comments: Rolling to side for assist for skin care to back. Mod A to scoot up in bed using bedrails. declined EOB or OOB attempts    Transfers                   General transfer comment: pt declined      Balance                                           ADL either performed or assessed with clinical judgement   ADL Overall ADL's : Needs assistance/impaired Eating/Feeding: Independent;Bed level   Grooming: Set up;Bed level   Upper Body Bathing: Minimal assistance;Bed level Upper Body Bathing Details (indicate cue type and reason): pt requesting assist to scratch back - noted rash on R side; assisted to wash, rinse and dry back to prevent further skin issues. pt declined lotion assist Lower Body Bathing: Minimal assistance;Bed level   Upper Body Dressing : Set up;Bed level                           Vision Ability to See in Adequate Light: 0 Adequate Patient Visual Report: No change from baseline Vision Assessment?: No apparent visual deficits     Perception         Praxis  Pertinent Vitals/Pain Pain Assessment Pain Assessment: Faces Faces Pain Scale: Hurts even more Pain Location: back, R lateral thigh when touched for repositioning Pain Descriptors / Indicators: Discomfort, Guarding Pain Intervention(s): Monitored during session, Repositioned, Limited activity within patient's tolerance     Extremity/Trunk Assessment Upper Extremity Assessment Upper Extremity Assessment: Overall WFL for tasks assessed;Right hand dominant (dry, flaky skin)   Lower Extremity Assessment Lower Extremity Assessment: Defer to PT evaluation   Cervical / Trunk Assessment Cervical / Trunk  Assessment: Normal   Communication Communication Communication: No apparent difficulties   Cognition Arousal: Alert Behavior During Therapy: WFL for tasks assessed/performed, Agitated Cognition: No family/caregiver present to determine baseline             OT - Cognition Comments: likely near baseline, poor understanding of medical complexities, importance of care/rehab. some agitated remarks made towards staff                 Following commands: Intact       Cueing  General Comments   Cueing Techniques: Verbal cues      Exercises     Shoulder Instructions      Home Living Family/patient expects to be discharged to:: Shelter/Homeless                                 Additional Comments: homeless, reports a couple of recent falls      Prior Functioning/Environment Prior Level of Function : History of Falls (last six months)             Mobility Comments: AP transfer to w/c but reports not getting out of wheelchair much ADLs Comments: Reports indep with ADLs though unsure of accuracy d/t poor overall hygiene mgmt d/t housing insecurity    OT Problem List: Decreased safety awareness;Impaired balance (sitting and/or standing);Decreased knowledge of precautions   OT Treatment/Interventions: Self-care/ADL training;Therapeutic exercise;Patient/family education;Balance training;Therapeutic activities      OT Goals(Current goals can be found in the care plan section)   Acute Rehab OT Goals Patient Stated Goal: make sure i get my wheelchair OT Goal Formulation: With patient Time For Goal Achievement: 12/18/23 Potential to Achieve Goals: Fair ADL Goals Pt Will Perform Upper Body Bathing: with set-up;sitting Pt Will Perform Lower Body Bathing: with set-up;sitting/lateral leans;bed level Pt/caregiver will Perform Home Exercise Program: Increased strength;Both right and left upper extremity;With theraband;Independently;With written HEP  provided Additional ADL Goal #1: Pt to complete relevant functional transfers during ADLs with no more than Setup Assist   OT Frequency:  Min 1X/week    Co-evaluation              AM-PAC OT 6 Clicks Daily Activity     Outcome Measure Help from another person eating meals?: None Help from another person taking care of personal grooming?: A Little Help from another person toileting, which includes using toliet, bedpan, or urinal?: A Lot Help from another person bathing (including washing, rinsing, drying)?: A Little Help from another person to put on and taking off regular upper body clothing?: A Little Help from another person to put on and taking off regular lower body clothing?: A Lot 6 Click Score: 17   End of Session Nurse Communication: Mobility status  Activity Tolerance: Other (comment) (self limiting) Patient left: in bed;with call bell/phone within reach  OT Visit Diagnosis: Other abnormalities of gait and mobility (R26.89);History of falling (Z91.81)  Time: 8687-8673 OT Time Calculation (min): 14 min Charges:  OT General Charges $OT Visit: 1 Visit OT Evaluation $OT Eval Low Complexity: 1 Low  Mliss NOVAK, OTR/L Acute Rehab Services Office: [phone]   Mliss Fish 12/04/2023, 1:48 PM

## 2023-12-05 DIAGNOSIS — E871 Hypo-osmolality and hyponatremia: Secondary | ICD-10-CM | POA: Diagnosis not present

## 2023-12-05 LAB — COMPREHENSIVE METABOLIC PANEL WITH GFR
ALT: 29 U/L (ref 0–44)
AST: 58 U/L — ABNORMAL HIGH (ref 15–41)
Albumin: 2 g/dL — ABNORMAL LOW (ref 3.5–5.0)
Alkaline Phosphatase: 44 U/L (ref 38–126)
Anion gap: 7 (ref 5–15)
BUN: <5 mg/dL — ABNORMAL LOW (ref 6–20)
CO2: 27 mmol/L (ref 22–32)
Calcium: 8 mg/dL — ABNORMAL LOW (ref 8.9–10.3)
Chloride: 92 mmol/L — ABNORMAL LOW (ref 98–111)
Creatinine, Ser: 0.41 mg/dL — ABNORMAL LOW (ref 0.61–1.24)
GFR, Estimated: >60 mL/min (ref >60)
Glucose, Bld: 109 mg/dL — ABNORMAL HIGH (ref 70–99)
Potassium: 3.4 mmol/L — ABNORMAL LOW (ref 3.5–5.1)
Sodium: 126 mmol/L — ABNORMAL LOW (ref 135–145)
Total Bilirubin: 0.4 mg/dL (ref 0.0–1.2)
Total Protein: 5.9 g/dL — ABNORMAL LOW (ref 6.5–8.1)

## 2023-12-05 LAB — CBC
HCT: 31.9 % — ABNORMAL LOW (ref 39.0–52.0)
Hemoglobin: 10.8 g/dL — ABNORMAL LOW (ref 13.0–17.0)
MCH: 28.1 pg (ref 26.0–34.0)
MCHC: 33.9 g/dL (ref 30.0–36.0)
MCV: 82.9 fL (ref 80.0–100.0)
Platelets: 279 K/uL (ref 150–400)
RBC: 3.85 MIL/uL — ABNORMAL LOW (ref 4.22–5.81)
RDW: 14.2 % (ref 11.5–15.5)
WBC: 7.4 K/uL (ref 4.0–10.5)
nRBC: 0 % (ref 0.0–0.2)

## 2023-12-05 LAB — PHOSPHORUS: Phosphorus: 2.3 mg/dL — ABNORMAL LOW (ref 2.5–4.6)

## 2023-12-05 LAB — SODIUM
Sodium: 122 mmol/L — ABNORMAL LOW (ref 135–145)
Sodium: 125 mmol/L — ABNORMAL LOW (ref 135–145)

## 2023-12-05 LAB — MAGNESIUM: Magnesium: 1.8 mg/dL (ref 1.7–2.4)

## 2023-12-05 MED ORDER — OXYCODONE HCL 5 MG PO TABS
5.0000 mg | ORAL_TABLET | Freq: Four times a day (QID) | ORAL | Status: DC | PRN
  Administered 2023-12-05 – 2023-12-11 (×11): 5 mg via ORAL
  Filled 2023-12-05 (×12): qty 1

## 2023-12-05 MED ORDER — POTASSIUM CHLORIDE 20 MEQ PO PACK
40.0000 meq | PACK | Freq: Once | ORAL | Status: AC
  Administered 2023-12-05: 40 meq via ORAL
  Filled 2023-12-05: qty 2

## 2023-12-05 MED ORDER — ENSURE PLUS HIGH PROTEIN PO LIQD
237.0000 mL | Freq: Three times a day (TID) | ORAL | Status: DC
  Administered 2023-12-05 – 2023-12-06 (×2): 237 mL via ORAL

## 2023-12-05 MED ORDER — MAGNESIUM SULFATE 2 GM/50ML IV SOLN
2.0000 g | Freq: Once | INTRAVENOUS | Status: AC
  Administered 2023-12-05: 2 g via INTRAVENOUS
  Filled 2023-12-05: qty 50

## 2023-12-05 MED ORDER — POTASSIUM CHLORIDE CRYS ER 20 MEQ PO TBCR
40.0000 meq | EXTENDED_RELEASE_TABLET | Freq: Once | ORAL | Status: AC
  Administered 2023-12-05: 40 meq via ORAL
  Filled 2023-12-05: qty 2

## 2023-12-05 NOTE — TOC Initial Note (Addendum)
 Transition of Care [facility]) - Initial/Assessment Note    Patient Details  Name: Jake Nguyen MRN: [id number] Date of Birth: September 26, 1965  Transition of [facility]) CM/SW Contact:    Isaiah Public, LCSWA Phone Number: 12/05/2023, 2:34 PM  Clinical Narrative:                  CSW spoke with patient at bedside regarding possible SNF placement. Patient declined SNF. Patient reports his wheelchair was left at [address] and that he wants to be discharged back to the location he was picked up at to get back to his wheelchair. Patient reports he has no family or friends that can go back to location he was picked up at and bring wheelchair to the hospital. [facility] supervisor Albino gave CSW permission to see if [employer] will go by the address patient was picked up at to see if patients wheelchair is still there. If so , see if [employer] able to bring patients wheelchair back to hospital. CSW spoke with patient who is agreeable to plan. CSW spoke with [employer] who is going to see if patients wheelchair is still at location. If so,[employer] will transport wheelchair back to hospital. CSW offered patient shelter resources, [employer], and outpatient substance use treatment services resources. Patient accepted all resources. Patient reports he will need transportation at dc. Patient signed rider waiver. Rider waiver placed in patients hard chart. TOC will continue to follow.  Update- [employer] called CSW and informed CSW that wheelchair was not at location he was picked up at. CSW informed patient and Albino with TOC.       Patient Goals and CMS Choice            Expected Discharge Plan and Services                                              Prior Living Arrangements/Services                       Activities of Daily Living      Permission Sought/Granted                  Emotional Assessment              Admission  diagnosis:  Acute hyponatremia [E87.1] Hyponatremia [E87.1] Patient Active Problem List   Diagnosis Date Noted   Atrial fibrillation with RVR (HCC) 12/04/2023   Alcohol  abuse 12/04/2023   Acute hyponatremia 12/03/2023   Atherosclerosis of native arteries of extremities with gangrene, right leg (HCC) 11/13/2023   Right below-knee amputee (HCC) 11/13/2023   History of osteomyelitis (HCC) 11/12/2023   Anxiety 11/12/2023   Homelessness 11/12/2023   Hyperlipidemia 11/12/2023   Malnutrition of moderate degree 09/24/2023   Cellulitis of right lower extremity 09/21/2023   Hypomagnesemia 09/21/2023   Pressure injury of buttock, stage 1 09/21/2023   Gangrene of right foot (HCC) 08/19/2023   Alcohol  use disorder, severe, dependence (HCC) 08/19/2023   Hypotension 08/19/2023   Depression 08/19/2023   Tobacco use 08/19/2023   Right foot infection 07/21/2023   Right BKA Wound infection 07/20/2023   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   Gram-negative bacteremia 11/24/2020   Cellulitis of left leg 11/19/2020   History of alcohol  withdrawal syndrome 11/19/2020   Hypokalemia 11/19/2020   Sepsis (HCC) 11/14/2020  S/P bilateral BKA (below knee amputation) (HCC)    Neuropathic pain, leg, left    Gangrene of foot (HCC) 07/14/2020   Ataxia    Pressure injury of skin 05/25/2018   Rhabdomyolysis 05/20/2018   Hyponatremia 05/20/2018   Elevated troponin 05/20/2018   Moderate dehydration 05/20/2018   Chronic alcohol  use 05/20/2018   Transaminitis 05/20/2018   PCP:  Pcp, No Pharmacy:   [id number] - [place], [place] - [address] AT [address] [place] [address] Phone: [phone] Fax: [phone]  [place] [facility] [address] [place] [address] Phone: [phone] Fax: [phone]  [facility] [address] [place] [address] Phone: [phone] Fax: [phone]     Social  Drivers of Health (SDOH) Social History: SDOH Screenings   Food Insecurity: Food Insecurity Present (12/05/2023)  Housing: High Risk (12/05/2023)  Transportation Needs: Unmet Transportation Needs (12/05/2023)  Utilities: Patient Declined (12/05/2023)  Alcohol  Screen: Medium Risk (11/25/2020)  Depression (PHQ2-9): Low Risk  (11/06/2019)  Social Connections: Socially Isolated (07/20/2023)  Tobacco Use: High Risk (12/03/2023)   SDOH Interventions:     Readmission Risk Interventions    11/15/2023   11:58 AM  Readmission Risk Prevention Plan  Transportation Screening Complete  Medication Review (RN Care Manager) Complete  PCP or Specialist appointment within 3-5 days of discharge Complete  HRI or Home Care Consult Complete  SW Recovery Care/Counseling Consult Complete  Palliative Care Screening Not Applicable  Skilled Nursing Facility Complete

## 2023-12-05 NOTE — Progress Notes (Incomplete)
Patient is alert and oriented.

## 2023-12-05 NOTE — Plan of Care (Signed)
  Problem: Clinical Measurements: Goal: Cardiovascular complication will be avoided Outcome: Progressing   Problem: Skin Integrity: Goal: Risk for impaired skin integrity will decrease Outcome: Not Progressing

## 2023-12-05 NOTE — Progress Notes (Addendum)
 Mobility Specialist Progress Note;   12/05/23 1200  Mobility  Activity Turned to right side;Turned to left side;Dangled on edge of bed  Level of Assistance Maximum assist, patient does 25-49%  Assistive Device None  Activity Response Tolerated fair  Mobility Referral No  Mobility visit 1 Mobility  Mobility Specialist Start Time (ACUTE ONLY) 1200  Mobility Specialist Stop Time (ACUTE ONLY) 1210  Mobility Specialist Time Calculation (min) (ACUTE ONLY) 10 min   NT requesting assistance w/ pt as pt was becoming agitated w/ NT. Pt found w/ legs dangled on EoB and laying on R side. Required MaxA to roll pt L and R to assist w/ cleaning and bed pad changing. No further mobility. Pt left comfortably in bed with all needs met. NT notified.   Lauraine Erm Mobility Specialist Please contact via SecureChat or [employer] [phone]

## 2023-12-05 NOTE — Progress Notes (Signed)
 Initial Nutrition Assessment  DOCUMENTATION CODES:   Non-severe (moderate) malnutrition in context of social or environmental circumstances  INTERVENTION:  -Continue HH menu, regular texture, thin liquids -Add Ensure Plus High Protein TID -Continue MVI, Thiamine , Folic Acid  -Encouraged adequate kcal/pro intake at baseline for healing, fueling body, etc  -Pt is HIGH RISK for refeeding syndrome. Phos, Mg decreased. Continue to monitor and replete prn.    NUTRITION DIAGNOSIS:   Moderate Malnutrition related to social / environmental circumstances as evidenced by moderate fat depletion, mild muscle depletion.  GOAL:   Patient will meet greater than or equal to 90% of their needs   MONITOR:   Skin, Supplement acceptance, PO intake, Labs, Weight trends  REASON FOR ASSESSMENT:   Consult Assessment of nutrition requirement/status  ASSESSMENT:   Hx homelessness, wheelchair-bound due to bilateral LE amputation brought in by EMS after a fall from his wheelchair at the bus stop. Coffee-ground emesis was noted.  On ED arrival his clothes were soiled with dirt, urine and feces .  He has thick texture skin with flaking both his arms . He admits to drinking heavily.  He has a history of recurrent hyponatremia due to chronic alcohol  use.  He was admitted 7 18-7/28 for right BKA due to osteomyelitis   Readmitted 7/28 for stump infection. He was not compliant with antibiotics given at discharge.  Readmitted 8/3 to 8/13 after falling from wheelchair for sepsis, hypotension. He did not want to go to SNF and was provided with wound supplies and asked to complete course of antibiotics.  Spoke to pt at bedside. Pt denies n/v/c/d or chewing/swallowing difficulties. Pt states last BM today, however, none documented this admission. Pt's weight overall seems stable. Pt vehemently denies poor PO intake, missed meals at baseline. Attempted NFPE, pt would only allow this RD to touch head, hands r/t everything  is sore and I don't want you touching it. Was able to ascertain enough information to find pt has moderate protein calorie malnutrition, would not be surprised if actually severe. Pt does have documented meal intake of 100% while admitted; would suspect ETOH abuse/homelessness causes decreased intake at baseline. Add Ensure Plus High Protein TID to promote adequate kcal/pro intake with Mod PCM, wound healing. Pt denies questions/concerns/needs at this time, will continue to monitor, RDN available prn.   Labs Na 122 Potassium 3.4 Chloride 92 BG 109-147 BUN <5 Cr 0.41 Calcium  8.0 Phos 2.3 Albumin  2.0 AST 58 H/H 10.8/31.9   Medications  amoxicillin -clavulanate  1 tablet Oral Q12H   Chlorhexidine  Gluconate Cloth  6 each Topical Daily   docusate sodium   100 mg Oral BID   doxycycline   100 mg Oral Q12H   enoxaparin  (LOVENOX ) injection  40 mg Subcutaneous Q24H   feeding supplement  237 mL Oral TID BM   folic acid   1 mg Oral Daily   Gerhardt's butt cream   Topical BID   multivitamin with minerals  1 tablet Oral Daily   ondansetron   4 mg Oral Once   polyethylene glycol  17 g Oral BID   thiamine   100 mg Oral Daily   Or   thiamine   100 mg Intravenous Daily     NUTRITION - FOCUSED PHYSICAL EXAM:  Flowsheet Row Most Recent Value  Orbital Region Moderate depletion  Upper Arm Region Unable to assess  Thoracic and Lumbar Region Unable to assess  Buccal Region Moderate depletion  Temple Region Moderate depletion  Clavicle Bone Region Unable to assess  Clavicle and Acromion Bone Region  Unable to assess  Scapular Bone Region Unable to assess  Dorsal Hand Mild depletion  Patellar Region Unable to assess  Anterior Thigh Region Unable to assess  Posterior Calf Region Unable to assess  Edema (RD Assessment) Mild  Hair Reviewed  Eyes Reviewed  Mouth Reviewed  Skin Reviewed  Nails Reviewed    Diet Order:   Diet Order             Diet Heart Room service appropriate? Yes; Fluid  consistency: Thin  Diet effective now                   EDUCATION NEEDS:   Education needs have been addressed  Skin:  Skin Assessment: Skin Integrity Issues: Skin Integrity Issues:: Other (Comment), Stage II, DTI DTI: Sacrum Stage II: Buttocks Other: Pretibial right, right thigh excoriation  Last BM:  PTA  Height:   Ht Readings from Last 1 Encounters:  12/04/23 5' 10 (1.778 m)    Weight:   Wt Readings from Last 1 Encounters:  12/05/23 65.4 kg    BMI:  Body mass index is 20.69 kg/m.  Estimated Nutritional Needs:   Kcal:  1800-2200 kcal  Protein:  90-120 g  Fluid:  >/=2 L  Colene Mines Daml-Budig, RDN, LDN Registered Dietitian Nutritionist RD Inpatient Contact Info in [place]

## 2023-12-05 NOTE — Progress Notes (Signed)
 PROGRESS NOTE    Jake Nguyen  FMW:[id number] DOB: 01-27-1966 DOA: 12/03/2023 PCP: Pcp, No   Brief Narrative:  This 58 years old homeless male, wheelchair-bound due to bilateral lower extremity amputation, brought in ED status post [place] his [place] bus [place].  He also reported coffee-ground emesis.  On ED arrival his clothes were soiled with dirt, urine and feces.  He has thick textured skin with flaking both in his arms.  He admits to drinking heavily.  Patient has history of recurrent hyponatremia due to chronic alcohol  use.  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  He was noncompliant with antibiotics given at discharge.  He was readmitted 8/3-8/13 after falling from wheelchair for sepsis and hypotension.  He did not want to go to nursing home and was provided the wound supplies in August to complete the course of antibiotics which he did not.  Patient was admitted in the ICU and has received hypertonic saline.  Sodium is now improving. TRH pickup 12/05/2023.   Assessment & Plan:   Principal Problem:   Acute hyponatremia Active Problems:   Atrial fibrillation with RVR (HCC)   Alcohol  abuse  Acute hyponatremia: Patient has history of recurrent hyponatremia in the setting of chronic alcohol  abuse.  Serum Osm 252, urine Osm 129, urine Na<10. Na on admission was 109 now 122 with hypertonic saline administration.  Likely due to beer potomania.  Neurologically intact, no evidence of OSD in the setting of rapid sodium correction. Stop D5 now that his Na>120 Continue to Trend Na q6h. Serum sodium improving 121 >124 >126   Atrial fibrillation with RVR : Short run of Afib RVR on 8/25 which was self-limited likely due to electrolyte disturbances. Continue to trend electrolytes and replete aggressively.  Hypokalemia / Hypomagnesemia: Replaced.  Continue to monitor   Lactic acidosis, resolved: Suspect likely due to hypovolemia in the setting of poor  PO intake.  Less likely due to sepsis, however, last admission he was given a course of PO antibiotics for stump infection and unclear if he completed.  Stop IV Unasyn  and resume Augmentin  and doxycycline  to complete 7 day course    Alcohol  abuse: Alcohol  level on admission 83.  - Continue CIWA protocol - Continue thiamine  and multivitamin    DVT prophylaxis: Lovenox  Code Status: Full code Family Communication: No family at bedside Disposition Plan:    Status is: Inpatient Remains inpatient appropriate because: Severity of illness   Consultants:  PCCM  Procedures:  Antimicrobials:  Anti-infectives (From admission, onward)    Start     Dose/Rate Route Frequency Ordered Stop   12/04/23 1145  amoxicillin -clavulanate (AUGMENTIN ) 875-125 MG per tablet 1 tablet        1 tablet Oral Every 12 hours 12/04/23 1054 12/08/23 0959   12/04/23 1145  doxycycline  (VIBRA -TABS) tablet 100 mg        100 mg Oral Every 12 hours 12/04/23 1054 12/08/23 0959   12/03/23 1445  Ampicillin -Sulbactam (UNASYN ) 3 g in sodium chloride  0.9 % 100 mL IVPB  Status:  Discontinued        3 g 200 mL/hr over 30 Minutes Intravenous Every 6 hours 12/03/23 1430 12/04/23 1053       Subjective: Patient was seen and examined at bedside.  Overnight events noted. Patient denies any concerns. He appears deconditioned, with scaling of skin in both arms. His sodium is improving.  He remains on gentle IV hydration.  Objective: Vitals:  12/04/23 2359 12/05/23 0401 12/05/23 0606 12/05/23 0902  BP: 105/68 116/73 100/63 109/67  Pulse:  (!) 111 88 81  Resp:  16  18  Temp: 98.7 F (37.1 C) 99.1 F (37.3 C) 99.1 F (37.3 C) 98.1 F (36.7 C)  TempSrc: Oral Oral Oral   SpO2:  98% 98% 100%  Weight: 63.5 kg 65.4 kg    Height: 5' 10 (1.778 m)       Intake/Output Summary (Last 24 hours) at 12/05/2023 1110 Last data filed at 12/05/2023 0759 Gross per 24 hour  Intake 1331.42 ml  Output 3450 ml  Net -2118.58 ml   Filed  Weights   12/04/23 0500 12/04/23 2359 12/05/23 0401  Weight: 67.4 kg 63.5 kg 65.4 kg    Examination:  General exam: Appears calm and comfortable, not in any acute distress. Respiratory system: CTA Bilaterally. Respiratory effort normal. RR 16 Cardiovascular system: S1 & S2 heard, RRR. No JVD, murmurs, rubs, gallops or clicks.  Gastrointestinal system: Abdomen is non distended, soft and non tender. Normal bowel sounds heard. Central nervous system: Alert and oriented x 3 . No focal neurological deficits. Extremities: Bilateral AKA.  No edema, no cyanosis. Skin: No rashes, lesions or ulcers Psychiatry: Judgement and insight appear normal. Mood & affect appropriate.     Data Reviewed: I have personally reviewed following labs and imaging studies  CBC: Recent Labs  Lab 12/03/23 1031 12/03/23 1104 12/04/23 0447 12/05/23 0624  WBC 14.8*  --  7.9 7.4  NEUTROABS 10.5*  --   --   --   HGB 11.4* 12.9* 10.1* 10.8*  HCT 32.4* 38.0* 28.6* 31.9*  MCV 78.3*  --  79.0* 82.9  PLT 338  --  277 279   Basic Metabolic Panel: Recent Labs  Lab 12/03/23 1031 12/03/23 1104 12/03/23 1746 12/04/23 0447 12/04/23 1103 12/04/23 1855 12/04/23 2238 12/05/23 0624  NA 111*  109* 108*   < > 122* 121* 124* 124* 126*  K 3.4* 3.4*  --  2.5* 3.6  --  3.6 3.4*  CL 71*  --   --  86* 87*  --   --  92*  CO2 21*  --   --  27 25  --   --  27  GLUCOSE 118*  --   --  128* 133*  --   --  109*  BUN 5*  --   --  <5* <5*  --   --  <5*  CREATININE 0.60*  --   --  0.59* 0.50*  --   --  0.41*  CALCIUM  7.8*  --   --  7.4* 7.3*  --   --  8.0*  MG  --   --   --  1.7  1.8  --   --   --  1.8  PHOS  --   --   --  2.8  --   --   --  2.3*   < > = values in this interval not displayed.   GFR: Estimated Creatinine Clearance: 95.4 mL/min (A) (by C-G formula based on SCr of 0.41 mg/dL (L)). Liver Function Tests: Recent Labs  Lab 12/03/23 1031 12/05/23 0624  AST 116* 58*  ALT 29 29  ALKPHOS 56 44  BILITOT 1.0 0.4   PROT 6.9 5.9*  ALBUMIN  2.6* 2.0*   Recent Labs  Lab 12/03/23 1031  LIPASE 21   No results for input(s): AMMONIA in the last 168 hours. Coagulation Profile: No results for input(s): INR,  PROTIME in the last 168 hours. Cardiac Enzymes: No results for input(s): CKTOTAL, CKMB, CKMBINDEX, TROPONINI in the last 168 hours. BNP (last 3 results) No results for input(s): PROBNP in the last 8760 hours. HbA1C: No results for input(s): HGBA1C in the last 72 hours. CBG: Recent Labs  Lab 12/04/23 0406 12/04/23 0739 12/04/23 1102 12/04/23 1534 12/04/23 1954  GLUCAP 135* 142* 142* 130* 147*   Lipid Profile: No results for input(s): CHOL, HDL, LDLCALC, TRIG, CHOLHDL, LDLDIRECT in the last 72 hours. Thyroid  Function Tests: Recent Labs    12/03/23 1031  TSH 0.396   Anemia Panel: No results for input(s): VITAMINB12, FOLATE, FERRITIN, TIBC, IRON , RETICCTPCT in the last 72 hours. Sepsis Labs: Recent Labs  Lab 12/03/23 1046 12/03/23 1313  LATICACIDVEN 2.9* 1.4    Recent Results (from the past 240 hours)  MRSA Next Gen by PCR, Nasal     Status: None   Collection Time: 12/03/23  2:26 PM   Specimen: Nasal Mucosa; Nasal Swab  Result Value Ref Range Status   MRSA by PCR Next Gen NOT DETECTED NOT DETECTED Final    Comment: (NOTE) The GeneXpert MRSA Assay (FDA approved for NASAL specimens only), is one component of a comprehensive MRSA colonization surveillance program. It is not intended to diagnose MRSA infection nor to guide or monitor treatment for MRSA infections. Test performance is not FDA approved in patients less than 64 years old. Performed at [facility], [address] [place], [place] [address]     Radiology Studies: CT Head Wo Contrast Result Date: 12/03/2023 CLINICAL DATA:  Patient fell from wheelchair. Altered mental status and syncope. EXAM: CT HEAD WITHOUT CONTRAST CT CERVICAL SPINE WITHOUT CONTRAST TECHNIQUE:  Multidetector CT imaging of the head and cervical spine was performed following the standard protocol without intravenous contrast. Multiplanar CT image reconstructions of the cervical spine were also generated. RADIATION DOSE REDUCTION: This exam was performed according to the departmental dose-optimization program which includes automated exposure control, adjustment of the mA and/or kV according to patient size and/or use of iterative reconstruction technique. COMPARISON:  Head CT 11/12/2023.  CT cervical spine 09/17/2023. FINDINGS: CT HEAD FINDINGS Brain: There is no evidence for acute hemorrhage, hydrocephalus, mass lesion, or abnormal extra-axial fluid collection. No definite CT evidence for acute infarction. Diffuse loss of parenchymal volume is consistent with atrophy. Vascular: No hyperdense vessel or unexpected calcification. Skull: No evidence for fracture. No worrisome lytic or sclerotic lesion. Sinuses/Orbits: The visualized paranasal sinuses and mastoid air cells are clear. Visualized portions of the globes and intraorbital fat are unremarkable. Other: None. CT CERVICAL SPINE FINDINGS Alignment: Reversal of normal cervical lordosis. No evidence for traumatic subluxation. Skull base and vertebrae: No acute fracture. No primary bone lesion or focal pathologic process. Soft tissues and spinal canal: No prevertebral fluid or swelling. No visible canal hematoma. Disc levels:  Preserved throughout. Upper chest: Paraseptal emphysema noted in the lung apices. Other: None. IMPRESSION: 1. No acute intracranial abnormality. 2. No evidence for cervical spine fracture or traumatic subluxation. 3. Reversal of normal cervical lordosis. This can be related to patient positioning, muscle spasm or soft tissue injury. 4.  Emphysema (ICD10-J43.9). Electronically Signed   By: Camellia Candle M.D.   On: 12/03/2023 13:24   CT Cervical Spine Wo Contrast Result Date: 12/03/2023 CLINICAL DATA:  Patient fell from wheelchair.  Altered mental status and syncope. EXAM: CT HEAD WITHOUT CONTRAST CT CERVICAL SPINE WITHOUT CONTRAST TECHNIQUE: Multidetector CT imaging of the head and cervical spine  was performed following the standard protocol without intravenous contrast. Multiplanar CT image reconstructions of the cervical spine were also generated. RADIATION DOSE REDUCTION: This exam was performed according to the departmental dose-optimization program which includes automated exposure control, adjustment of the mA and/or kV according to patient size and/or use of iterative reconstruction technique. COMPARISON:  Head CT 11/12/2023.  CT cervical spine 09/17/2023. FINDINGS: CT HEAD FINDINGS Brain: There is no evidence for acute hemorrhage, hydrocephalus, mass lesion, or abnormal extra-axial fluid collection. No definite CT evidence for acute infarction. Diffuse loss of parenchymal volume is consistent with atrophy. Vascular: No hyperdense vessel or unexpected calcification. Skull: No evidence for fracture. No worrisome lytic or sclerotic lesion. Sinuses/Orbits: The visualized paranasal sinuses and mastoid air cells are clear. Visualized portions of the globes and intraorbital fat are unremarkable. Other: None. CT CERVICAL SPINE FINDINGS Alignment: Reversal of normal cervical lordosis. No evidence for traumatic subluxation. Skull base and vertebrae: No acute fracture. No primary bone lesion or focal pathologic process. Soft tissues and spinal canal: No prevertebral fluid or swelling. No visible canal hematoma. Disc levels:  Preserved throughout. Upper chest: Paraseptal emphysema noted in the lung apices. Other: None. IMPRESSION: 1. No acute intracranial abnormality. 2. No evidence for cervical spine fracture or traumatic subluxation. 3. Reversal of normal cervical lordosis. This can be related to patient positioning, muscle spasm or soft tissue injury. 4.  Emphysema (ICD10-J43.9). Electronically Signed   By: Camellia Candle M.D.   On: 12/03/2023  13:24   CT ABDOMEN PELVIS W CONTRAST Result Date: 12/03/2023 EXAM: CT ABDOMEN AND PELVIS WITH CONTRAST 12/03/2023 12:45:37 PM TECHNIQUE: CT of the abdomen and pelvis was performed with the administration of intravenous contrast (75mL iohexol  (OMNIPAQUE ) 350 MG/ML injection). Multiplanar reformatted images are provided for review. Automated exposure control, iterative reconstruction, and/or weight-based adjustment of the mA/kV was utilized to reduce the radiation dose to as low as reasonably achievable. COMPARISON: None available. CLINICAL HISTORY: Abdominal pain, acute, nonlocalized. Pt bib ems from bus stop c/o fall from wheelchair. [facility]AR alerted EMS for AMS and syncopal episode. FINDINGS: LOWER CHEST: Circumferential thickening of the visualized distal esophagus. Small hiatal hernia. LIVER: Normal size and contour. GALLBLADDER AND BILE DUCTS: Several small calcified gallstones layer at the dependent aspect of the nondilated gallbladder, without adjacent inflammatory change. SPLEEN: Normal size. No focal lesion. PANCREAS: No mass. No ductal dilatation. ADRENAL GLANDS: Normal appearance. No mass. KIDNEYS, URETERS AND BLADDER: No stones in the kidneys or ureters. No hydronephrosis. No perinephric or periureteral stranding. Urinary bladder distended. GI AND BOWEL: Stomach demonstrates no acute abnormality. There is no bowel obstruction. No bowel wall thickening. PERITONEUM AND RETROPERITONEUM: No ascites. No free air. VASCULATURE: Scattered calcified aortic plaque without aneurysm. LYMPH NODES: Mildly enlarged bilateral inguinal lymph nodes right greater than left, nonspecific. REPRODUCTIVE ORGANS: No significant abnormality. BONES AND SOFT TISSUES: Mild degenerative disc disease at L4-5. No acute osseous abnormality. No focal soft tissue abnormality. IMPRESSION: 1. No acute findings in the abdomen or pelvis. 2. Circumferential thickening of the visualized distal esophagus and small hiatal hernia. 3. Cholelithiasis  Electronically signed by: Dayne Hassell MD 12/03/2023 01:07 PM EDT RP Workstation: [id number]   Scheduled Meds:  amoxicillin -clavulanate  1 tablet Oral Q12H   Chlorhexidine  Gluconate Cloth  6 each Topical Daily   docusate sodium   100 mg Oral BID   doxycycline   100 mg Oral Q12H   enoxaparin  (LOVENOX ) injection  40 mg Subcutaneous Q24H   feeding supplement  237 mL Oral TID BM   folic  acid  1 mg Oral Daily   Gerhardt's butt cream   Topical BID   multivitamin with minerals  1 tablet Oral Daily   ondansetron   4 mg Oral Once   polyethylene glycol  17 g Oral BID   thiamine   100 mg Oral Daily   Or   thiamine   100 mg Intravenous Daily   Continuous Infusions:   LOS: 2 days    Time spent: 50 mins    Darcel Dawley, MD [facility]   If 7PM-7AM, please contact night-coverage

## 2023-12-05 NOTE — Plan of Care (Signed)
  Problem: Pain Managment: Goal: General experience of comfort will improve and/or be controlled Outcome: Progressing   Problem: Elimination: Goal: Will not experience complications related to bowel motility Outcome: Progressing Goal: Will not experience complications related to urinary retention Outcome: Progressing   Problem: Nutrition: Goal: Adequate nutrition will be maintained Outcome: Progressing

## 2023-12-06 DIAGNOSIS — E871 Hypo-osmolality and hyponatremia: Secondary | ICD-10-CM | POA: Diagnosis not present

## 2023-12-06 LAB — BASIC METABOLIC PANEL WITH GFR
Anion gap: 8 (ref 5–15)
BUN: <5 mg/dL — ABNORMAL LOW (ref 6–20)
CO2: 26 mmol/L (ref 22–32)
Calcium: 8 mg/dL — ABNORMAL LOW (ref 8.9–10.3)
Chloride: 92 mmol/L — ABNORMAL LOW (ref 98–111)
Creatinine, Ser: 0.46 mg/dL — ABNORMAL LOW (ref 0.61–1.24)
GFR, Estimated: >60 mL/min (ref >60)
Glucose, Bld: 110 mg/dL — ABNORMAL HIGH (ref 70–99)
Potassium: 3.7 mmol/L (ref 3.5–5.1)
Sodium: 126 mmol/L — ABNORMAL LOW (ref 135–145)

## 2023-12-06 LAB — CBC
HCT: 31.2 % — ABNORMAL LOW (ref 39.0–52.0)
Hemoglobin: 10.5 g/dL — ABNORMAL LOW (ref 13.0–17.0)
MCH: 28 pg (ref 26.0–34.0)
MCHC: 33.7 g/dL (ref 30.0–36.0)
MCV: 83.2 fL (ref 80.0–100.0)
Platelets: 264 K/uL (ref 150–400)
RBC: 3.75 MIL/uL — ABNORMAL LOW (ref 4.22–5.81)
RDW: 14.4 % (ref 11.5–15.5)
WBC: 7.6 K/uL (ref 4.0–10.5)
nRBC: 0 % (ref 0.0–0.2)

## 2023-12-06 LAB — SODIUM
Sodium: 126 mmol/L — ABNORMAL LOW (ref 135–145)
Sodium: 126 mmol/L — ABNORMAL LOW (ref 135–145)

## 2023-12-06 LAB — MAGNESIUM: Magnesium: 1.7 mg/dL (ref 1.7–2.4)

## 2023-12-06 LAB — PHOSPHORUS: Phosphorus: 2.6 mg/dL (ref 2.5–4.6)

## 2023-12-06 MED ORDER — POTASSIUM CHLORIDE CRYS ER 20 MEQ PO TBCR
40.0000 meq | EXTENDED_RELEASE_TABLET | Freq: Once | ORAL | Status: AC
  Administered 2023-12-06: 40 meq via ORAL
  Filled 2023-12-06: qty 2

## 2023-12-06 MED ORDER — POLYETHYLENE GLYCOL 3350 17 G PO PACK
17.0000 g | PACK | Freq: Every day | ORAL | Status: DC
  Administered 2023-12-07 – 2023-12-10 (×4): 17 g via ORAL
  Filled 2023-12-06 (×6): qty 1

## 2023-12-06 MED ORDER — DOCUSATE SODIUM 100 MG PO CAPS
100.0000 mg | ORAL_CAPSULE | Freq: Two times a day (BID) | ORAL | Status: DC | PRN
  Administered 2023-12-10: 100 mg via ORAL
  Filled 2023-12-06: qty 1

## 2023-12-06 MED ORDER — MAGNESIUM SULFATE 4 GM/100ML IV SOLN
4.0000 g | Freq: Once | INTRAVENOUS | Status: AC
  Administered 2023-12-06: 4 g via INTRAVENOUS
  Filled 2023-12-06: qty 100

## 2023-12-06 NOTE — TOC Progression Note (Signed)
 Transition of Care [facility]) - Progression Note    Patient Details  Name: Jake Nguyen MRN: [id number] Date of Birth: [birth year]  Transition of Care [facility]) CM/SW Contact  Isaiah Public, LCSWA Phone Number: 12/06/2023, 4:52 PM  Clinical Narrative:     TOC continues to follow and assist with patients dc planning needs.   Expected Discharge Plan:  (TBD) Barriers to Discharge: Continued Medical Work up               Expected Discharge Plan and Services In-house Referral: Clinical Social Work                                             Social Drivers of Health (SDOH) Interventions SDOH Screenings   Food Insecurity: Food Insecurity Present (12/05/2023)  Housing: High Risk (12/05/2023)  Transportation Needs: Unmet Transportation Needs (12/05/2023)  Utilities: Patient Declined (12/05/2023)  Alcohol  Screen: Medium Risk (11/25/2020)  Depression (PHQ2-9): Low Risk  (11/06/2019)  Social Connections: Socially Isolated (07/20/2023)  Tobacco Use: High Risk (12/03/2023)    Readmission Risk Interventions    11/15/2023   11:58 AM  Readmission Risk Prevention Plan  Transportation Screening Complete  Medication Review (RN Care Manager) Complete  PCP or Specialist appointment within 3-5 days of discharge Complete  HRI or Home Care Consult Complete  SW Recovery Care/Counseling Consult Complete  Palliative Care Screening Not Applicable  Skilled Nursing Facility Complete

## 2023-12-06 NOTE — Progress Notes (Signed)
 PROGRESS NOTE    Jake Nguyen  FMW:[id number] DOB: [birth year] DOA: 12/03/2023 PCP: Pcp, No   Brief Narrative:  This 58 years old homeless male, wheelchair-bound due to bilateral lower extremity amputation, brought in ED status post [place] his [place] bus [place].  He also reported coffee-ground emesis.  On ED arrival his clothes were soiled with dirt, urine and feces.  He has thick textured skin with flaking both in his arms.  He admits to drinking heavily.  Patient has history of recurrent hyponatremia due to chronic alcohol  use.  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  He was noncompliant with antibiotics given at discharge.  He was readmitted 8/3-8/13 after falling from wheelchair for sepsis and hypotension.  He did not want to go to nursing home and was provided the wound supplies in August to complete the course of antibiotics which he did not.  Patient was admitted in the ICU and has received hypertonic saline.  Sodium is now improving. TRH pickup 12/05/2023.   Assessment & Plan:   Principal Problem:   Acute hyponatremia Active Problems:   Atrial fibrillation with RVR (HCC)   Alcohol  abuse  Acute hyponatremia: Patient has history of recurrent hyponatremia in the setting of chronic alcohol  abuse.  Serum Osm 252, urine Osm 129, urine Na<10. Na on admission was 109 now 122 with hypertonic saline administration.  Likely due to beer potomania.  Neurologically intact, no evidence of OSD in the setting of rapid sodium correction. Stop D5 now that his Na>120 Continue to Trend Na q6h. Serum sodium improving 121 >124 >126> 126   Atrial fibrillation with RVR : Short run of Afib RVR on 8/25 which was self-limited likely due to electrolyte disturbances. Continue to trend electrolytes and replete aggressively.  Hypokalemia / Hypomagnesemia: Replaced.  Continue to monitor   Lactic acidosis, resolved: Suspect likely due to hypovolemia in the setting of  poor PO intake.  Less likely due to sepsis, however, last admission he was given a course of PO antibiotics for stump infection and unclear if he completed.  Stop IV Unasyn  and resume Augmentin  and doxycycline  to complete 7 day course .   Alcohol  abuse: Alcohol  level on admission 83.  - Continue CIWA protocol - Continue thiamine  and multivitamin    DVT prophylaxis: Lovenox  Code Status: Full code Family Communication: No family at bedside. Disposition Plan:    Status is: Inpatient Remains inpatient appropriate because: Severity of illness   Consultants:  PCCM  Procedures:  Antimicrobials:  Anti-infectives (From admission, onward)    Start     Dose/Rate Route Frequency Ordered Stop   12/04/23 1145  amoxicillin -clavulanate (AUGMENTIN ) 875-125 MG per tablet 1 tablet        1 tablet Oral Every 12 hours 12/04/23 1054 12/08/23 0959   12/04/23 1145  doxycycline  (VIBRA -TABS) tablet 100 mg        100 mg Oral Every 12 hours 12/04/23 1054 12/08/23 0959   12/03/23 1445  Ampicillin -Sulbactam (UNASYN ) 3 g in sodium chloride  0.9 % 100 mL IVPB  Status:  Discontinued        3 g 200 mL/hr over 30 Minutes Intravenous Every 6 hours 12/03/23 1430 12/04/23 1053       Subjective: Patient was seen and examined at bedside.  Overnight events noted. Patient denies any concerns.  He appears very deconditioned,  he has scaling of his skin in his both arms. His sodium is improving.  He remains on gentle  IV hydration.  Objective: Vitals:   12/05/23 2024 12/06/23 0623 12/06/23 0837 12/06/23 1206  BP: 108/70  122/65 101/68  Pulse: (!) 115  (!) 112 (!) 115  Resp: 18  18 18   Temp:   99.1 F (37.3 C) 98.9 F (37.2 C)  TempSrc: Oral  Oral Oral  SpO2: 97%  99% 99%  Weight:  61.2 kg    Height:        Intake/Output Summary (Last 24 hours) at 12/06/2023 1208 Last data filed at 12/06/2023 1206 Gross per 24 hour  Intake 720 ml  Output 2900 ml  Net -2180 ml   Filed Weights   12/04/23 2359 12/05/23  0401 12/06/23 0623  Weight: 63.5 kg 65.4 kg 61.2 kg    Examination:  General exam: Appears calm and comfortable, not in any acute distress. Respiratory system: CTA Bilaterally. Respiratory effort normal. RR 15 Cardiovascular system: S1 & S2 heard, RRR. No JVD, murmurs, rubs, gallops or clicks.  Gastrointestinal system: Abdomen is non distended, soft and non tender. Normal bowel sounds heard. Central nervous system: Alert and oriented x 3 . No focal neurological deficits. Extremities: Bilateral AKA.  No edema, no cyanosis. Skin: Scaling of skin in both arms. Psychiatry: Judgement and insight appear normal. Mood & affect appropriate.     Data Reviewed: I have personally reviewed following labs and imaging studies  CBC: Recent Labs  Lab 12/03/23 1031 12/03/23 1104 12/04/23 0447 12/05/23 0624 12/06/23 0551  WBC 14.8*  --  7.9 7.4 7.6  NEUTROABS 10.5*  --   --   --   --   HGB 11.4* 12.9* 10.1* 10.8* 10.5*  HCT 32.4* 38.0* 28.6* 31.9* 31.2*  MCV 78.3*  --  79.0* 82.9 83.2  PLT 338  --  277 279 264   Basic Metabolic Panel: Recent Labs  Lab 12/03/23 1031 12/03/23 1104 12/03/23 1746 12/04/23 0447 12/04/23 1103 12/04/23 1855 12/04/23 2238 12/05/23 0624 12/05/23 1107 12/05/23 1745 12/06/23 0551  NA 111*  109* 108*   < > 122* 121*   < > 124* 126* 122* 125* 126*  K 3.4* 3.4*  --  2.5* 3.6  --  3.6 3.4*  --   --   --   CL 71*  --   --  86* 87*  --   --  92*  --   --   --   CO2 21*  --   --  27 25  --   --  27  --   --   --   GLUCOSE 118*  --   --  128* 133*  --   --  109*  --   --   --   BUN 5*  --   --  <5* <5*  --   --  <5*  --   --   --   CREATININE 0.60*  --   --  0.59* 0.50*  --   --  0.41*  --   --   --   CALCIUM  7.8*  --   --  7.4* 7.3*  --   --  8.0*  --   --   --   MG  --   --   --  1.7  1.8  --   --   --  1.8  --   --  1.7  PHOS  --   --   --  2.8  --   --   --  2.3*  --   --  2.6   < > = values in this interval not displayed.   GFR: Estimated Creatinine  Clearance: 89.3 mL/min (A) (by C-G formula based on SCr of 0.41 mg/dL (L)). Liver Function Tests: Recent Labs  Lab 12/03/23 1031 12/05/23 0624  AST 116* 58*  ALT 29 29  ALKPHOS 56 44  BILITOT 1.0 0.4  PROT 6.9 5.9*  ALBUMIN  2.6* 2.0*   Recent Labs  Lab 12/03/23 1031  LIPASE 21   No results for input(s): AMMONIA in the last 168 hours. Coagulation Profile: No results for input(s): INR, PROTIME in the last 168 hours. Cardiac Enzymes: No results for input(s): CKTOTAL, CKMB, CKMBINDEX, TROPONINI in the last 168 hours. BNP (last 3 results) No results for input(s): PROBNP in the last 8760 hours. HbA1C: No results for input(s): HGBA1C in the last 72 hours. CBG: Recent Labs  Lab 12/04/23 0406 12/04/23 0739 12/04/23 1102 12/04/23 1534 12/04/23 1954  GLUCAP 135* 142* 142* 130* 147*   Lipid Profile: No results for input(s): CHOL, HDL, LDLCALC, TRIG, CHOLHDL, LDLDIRECT in the last 72 hours. Thyroid  Function Tests: No results for input(s): TSH, T4TOTAL, FREET4, T3FREE, THYROIDAB in the last 72 hours.  Anemia Panel: No results for input(s): VITAMINB12, FOLATE, FERRITIN, TIBC, IRON , RETICCTPCT in the last 72 hours. Sepsis Labs: Recent Labs  Lab 12/03/23 1046 12/03/23 1313  LATICACIDVEN 2.9* 1.4    Recent Results (from the past 240 hours)  MRSA Next Gen by PCR, Nasal     Status: None   Collection Time: 12/03/23  2:26 PM   Specimen: Nasal Mucosa; Nasal Swab  Result Value Ref Range Status   MRSA by PCR Next Gen NOT DETECTED NOT DETECTED Final    Comment: (NOTE) The GeneXpert MRSA Assay (FDA approved for NASAL specimens only), is one component of a comprehensive MRSA colonization surveillance program. It is not intended to diagnose MRSA infection nor to guide or monitor treatment for MRSA infections. Test performance is not FDA approved in patients less than 56 years old. Performed at [facility], [address], [place], [place] [address]     Radiology Studies: No results found.  Scheduled Meds:  amoxicillin -clavulanate  1 tablet Oral Q12H   Chlorhexidine  Gluconate Cloth  6 each Topical Daily   docusate sodium   100 mg Oral BID   doxycycline   100 mg Oral Q12H   enoxaparin  (LOVENOX ) injection  40 mg Subcutaneous Q24H   feeding supplement  237 mL Oral TID BM   folic acid   1 mg Oral Daily   Gerhardt's butt cream   Topical BID   multivitamin with minerals  1 tablet Oral Daily   ondansetron   4 mg Oral Once   polyethylene glycol  17 g Oral BID   thiamine   100 mg Oral Daily   Or   thiamine   100 mg Intravenous Daily   Continuous Infusions:   LOS: 3 days    Time spent: 35 mins    Darcel Dawley, MD [facility]   If 7PM-7AM, please contact night-coverage

## 2023-12-06 NOTE — Plan of Care (Signed)
  Problem: Nutrition: Goal: Adequate nutrition will be maintained Outcome: Progressing   Problem: Coping: Goal: Level of anxiety will decrease Outcome: Progressing   Problem: Education: Goal: Knowledge of General Education information will improve Description: Including pain rating scale, medication(s)/side effects and non-pharmacologic comfort measures Outcome: Not Progressing   Problem: Clinical Measurements: Goal: Will remain free from infection Outcome: Not Progressing   Problem: Activity: Goal: Risk for activity intolerance will decrease Outcome: Not Progressing   Problem: Pain Managment: Goal: General experience of comfort will improve and/or be controlled Outcome: Not Progressing   Problem: Safety: Goal: Ability to remain free from injury will improve Outcome: Not Progressing   Problem: Skin Integrity: Goal: Risk for impaired skin integrity will decrease Outcome: Not Progressing

## 2023-12-06 NOTE — Progress Notes (Signed)
 Patient is AXOX3-4 with no clinical signs of distress or pain expressed at this time.   Patient remains on room air. Safety measures are in place, call light is within reach, and 4P's addressed.

## 2023-12-06 NOTE — Progress Notes (Signed)
 Left leg dressings changed this hour due to incontinent episode. Patient tolerated well.

## 2023-12-07 DIAGNOSIS — E871 Hypo-osmolality and hyponatremia: Secondary | ICD-10-CM | POA: Diagnosis not present

## 2023-12-07 LAB — CBC
HCT: 33.2 % — ABNORMAL LOW (ref 39.0–52.0)
Hemoglobin: 11.1 g/dL — ABNORMAL LOW (ref 13.0–17.0)
MCH: 28 pg (ref 26.0–34.0)
MCHC: 33.4 g/dL (ref 30.0–36.0)
MCV: 83.8 fL (ref 80.0–100.0)
Platelets: 294 K/uL (ref 150–400)
RBC: 3.96 MIL/uL — ABNORMAL LOW (ref 4.22–5.81)
RDW: 14.4 % (ref 11.5–15.5)
WBC: 7.4 K/uL (ref 4.0–10.5)
nRBC: 0 % (ref 0.0–0.2)

## 2023-12-07 LAB — SODIUM
Sodium: 129 mmol/L — ABNORMAL LOW (ref 135–145)
Sodium: 128 mmol/L — ABNORMAL LOW (ref 135–145)
Sodium: 129 mmol/L — ABNORMAL LOW (ref 135–145)

## 2023-12-07 LAB — MAGNESIUM: Magnesium: 1.9 mg/dL (ref 1.7–2.4)

## 2023-12-07 LAB — PHOSPHORUS: Phosphorus: 3.8 mg/dL (ref 2.5–4.6)

## 2023-12-07 MED ORDER — METOPROLOL TARTRATE 5 MG/5ML IV SOLN
2.5000 mg | Freq: Four times a day (QID) | INTRAVENOUS | Status: DC | PRN
  Administered 2023-12-07 – 2023-12-10 (×6): 2.5 mg via INTRAVENOUS
  Filled 2023-12-07 (×7): qty 5

## 2023-12-07 MED ORDER — POTASSIUM CHLORIDE CRYS ER 20 MEQ PO TBCR
40.0000 meq | EXTENDED_RELEASE_TABLET | Freq: Once | ORAL | Status: AC
  Administered 2023-12-07: 40 meq via ORAL
  Filled 2023-12-07: qty 2

## 2023-12-07 MED ORDER — MAGNESIUM SULFATE 2 GM/50ML IV SOLN
2.0000 g | Freq: Once | INTRAVENOUS | Status: AC
  Administered 2023-12-07: 2 g via INTRAVENOUS
  Filled 2023-12-07: qty 50

## 2023-12-07 NOTE — Progress Notes (Signed)
 Patient refused lab work this hour.

## 2023-12-07 NOTE — TOC Progression Note (Addendum)
 Transition of Care [facility]) - Progression Note    Patient Details  Name: Jake Nguyen MRN: [id number] Date of Birth: Jun 27, 1965  Transition of Care [facility]) CM/SW Contact  Isaiah Public, LCSWA Phone Number: 12/07/2023, 3:41 PM  Clinical Narrative:     CSW followed up with patient at bedside on dc plan. Patient plans to dc to [name] when medically ready.Patient has wheelchair at bedside. Patient will need transportation assistance when medically ready for dc.   Expected Discharge Plan:  (TBD) Barriers to Discharge: Continued Medical Work up               Expected Discharge Plan and Services In-house Referral: Clinical Social Work                                             Social Drivers of Health (SDOH) Interventions SDOH Screenings   Food Insecurity: Food Insecurity Present (12/05/2023)  Housing: High Risk (12/05/2023)  Transportation Needs: Unmet Transportation Needs (12/05/2023)  Utilities: Patient Declined (12/05/2023)  Alcohol  Screen: Medium Risk (11/25/2020)  Depression (PHQ2-9): Low Risk  (11/06/2019)  Social Connections: Socially Isolated (07/20/2023)  Tobacco Use: High Risk (12/03/2023)    Readmission Risk Interventions    11/15/2023   11:58 AM  Readmission Risk Prevention Plan  Transportation Screening Complete  Medication Review (RN Care Manager) Complete  PCP or Specialist appointment within 3-5 days of discharge Complete  HRI or Home Care Consult Complete  SW Recovery Care/Counseling Consult Complete  Palliative Care Screening Not Applicable  Skilled Nursing Facility Complete

## 2023-12-07 NOTE — Progress Notes (Signed)
 PROGRESS NOTE    Jake Nguyen  FMW:[id number] DOB: [birth year] DOA: 12/03/2023 PCP: Pcp, No   Brief Narrative:  This 58 years old homeless male, wheelchair-bound due to bilateral lower extremity amputation, brought in ED status post [place] his [place] bus [place].  He also reported coffee-ground emesis.  On ED arrival his clothes were soiled with dirt, urine and feces.  He has thick textured skin with flaking both in his arms.  He admits to drinking heavily.  Patient has history of recurrent hyponatremia due to chronic alcohol  use.  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  He was noncompliant with antibiotics given at discharge.  He was readmitted 8/3-8/13 after falling from wheelchair for sepsis and hypotension.  He did not want to go to nursing home and was provided the wound supplies in August to complete the course of antibiotics which he did not.  Patient was admitted in the ICU and has received hypertonic saline.  Sodium is now improving. TRH pickup 12/05/2023.   Assessment & Plan:   Principal Problem:   Acute hyponatremia Active Problems:   Atrial fibrillation with RVR (HCC)   Alcohol  abuse  Acute hyponatremia: Patient has history of recurrent hyponatremia in the setting of chronic alcohol  abuse.  Serum Osm 252, urine Osm 129, urine Na<10. Na on admission was 109 now 122 with hypertonic saline administration.  Likely due to beer potomania.  Neurologically intact, no evidence of OSD in the setting of rapid sodium correction. Stop D5 now that his Na>120 Continue to Trend Na q6h. Serum sodium improving 121 >124 >126> 126 >129   Atrial fibrillation with RVR : Short run of Afib RVR on 8/25 which was self-limited likely due to electrolyte disturbances. Continue to trend electrolytes and replete aggressively.  Hypokalemia / Hypomagnesemia: Replaced.  Continue to monitor   Lactic acidosis, resolved: Suspect likely due to hypovolemia in the  setting of poor PO intake.  Less likely due to sepsis, however, last admission he was given a course of PO antibiotics for stump infection and unclear if he completed.  Stop IV Unasyn  and resume Augmentin  and doxycycline  to complete 7 day course .   Alcohol  abuse: Alcohol  level on admission 83.  - Continue CIWA protocol - Continue thiamine  and multivitamin    DVT prophylaxis: Lovenox  Code Status: Full code Family Communication: No family at bedside. Disposition Plan:    Status is: Inpatient Remains inpatient appropriate because: Severity of illness.   Consultants:  PCCM  Procedures:  Antimicrobials:  Anti-infectives (From admission, onward)    Start     Dose/Rate Route Frequency Ordered Stop   12/04/23 1145  amoxicillin -clavulanate (AUGMENTIN ) 875-125 MG per tablet 1 tablet        1 tablet Oral Every 12 hours 12/04/23 1054 12/08/23 0959   12/04/23 1145  doxycycline  (VIBRA -TABS) tablet 100 mg        100 mg Oral Every 12 hours 12/04/23 1054 12/08/23 0959   12/03/23 1445  Ampicillin -Sulbactam (UNASYN ) 3 g in sodium chloride  0.9 % 100 mL IVPB  Status:  Discontinued        3 g 200 mL/hr over 30 Minutes Intravenous Every 6 hours 12/03/23 1430 12/04/23 1053       Subjective: Patient was seen and examined at bedside.  Overnight events noted. He appears very deconditioned,  he has scaling of his skin in his both arms. His sodium is improving.  He remains on gentle IV hydration.  Objective:  Vitals:   12/06/23 2008 12/06/23 2321 12/07/23 0405 12/07/23 0816  BP: 107/82 102/70 101/66 115/73  Pulse: (!) 104 (!) 107 78 (!) 116  Resp: 16 16 16 17   Temp: 99 F (37.2 C) 99.4 F (37.4 C) 98.7 F (37.1 C) 98.8 F (37.1 C)  TempSrc: Oral Oral Oral Oral  SpO2: 99% 98% 99% 98%  Weight:   62.2 kg   Height:        Intake/Output Summary (Last 24 hours) at 12/07/2023 1218 Last data filed at 12/07/2023 0407 Gross per 24 hour  Intake 115.07 ml  Output 2075 ml  Net -1959.93 ml    Filed Weights   12/05/23 0401 12/06/23 0623 12/07/23 0405  Weight: 65.4 kg 61.2 kg 62.2 kg    Examination:  General exam: Appears calm and comfortable, not in any acute distress. Respiratory system: CTA Bilaterally. Respiratory effort normal. RR 14 Cardiovascular system: S1 & S2 heard, RRR. No JVD, murmurs, rubs, gallops or clicks.  Gastrointestinal system: Abdomen is non distended, soft and non tender. Normal bowel sounds heard. Central nervous system: Alert and oriented x 3 . No focal neurological deficits. Extremities: Bilateral AKA.  No edema, no cyanosis. Skin: Scaling of skin in both arms. Psychiatry: Judgement and insight appear normal. Mood & affect appropriate.     Data Reviewed: I have personally reviewed following labs and imaging studies  CBC: Recent Labs  Lab 12/03/23 1031 12/03/23 1104 12/04/23 0447 12/05/23 0624 12/06/23 0551 12/07/23 0802  WBC 14.8*  --  7.9 7.4 7.6 7.4  NEUTROABS 10.5*  --   --   --   --   --   HGB 11.4* 12.9* 10.1* 10.8* 10.5* 11.1*  HCT 32.4* 38.0* 28.6* 31.9* 31.2* 33.2*  MCV 78.3*  --  79.0* 82.9 83.2 83.8  PLT 338  --  277 279 264 294   Basic Metabolic Panel: Recent Labs  Lab 12/03/23 1031 12/03/23 1104 12/04/23 0447 12/04/23 1103 12/04/23 1855 12/04/23 2238 12/05/23 0624 12/05/23 1107 12/05/23 1745 12/06/23 0551 12/06/23 1135 12/06/23 2248 12/07/23 0802  NA 111*  109*   < > 122* 121*   < > 124* 126*   < > 125* 126* 126* 126* 129*  K 3.4*   < > 2.5* 3.6  --  3.6 3.4*  --   --   --  3.7  --   --   CL 71*  --  86* 87*  --   --  92*  --   --   --  92*  --   --   CO2 21*  --  27 25  --   --  27  --   --   --  26  --   --   GLUCOSE 118*  --  128* 133*  --   --  109*  --   --   --  110*  --   --   BUN 5*  --  <5* <5*  --   --  <5*  --   --   --  <5*  --   --   CREATININE 0.60*  --  0.59* 0.50*  --   --  0.41*  --   --   --  0.46*  --   --   CALCIUM  7.8*  --  7.4* 7.3*  --   --  8.0*  --   --   --  8.0*  --   --   MG  --    --  1.7  1.8  --   --   --  1.8  --   --  1.7  --   --  1.9  PHOS  --   --  2.8  --   --   --  2.3*  --   --  2.6  --   --  3.8   < > = values in this interval not displayed.   GFR: Estimated Creatinine Clearance: 90.7 mL/min (A) (by C-G formula based on SCr of 0.46 mg/dL (L)). Liver Function Tests: Recent Labs  Lab 12/03/23 1031 12/05/23 0624  AST 116* 58*  ALT 29 29  ALKPHOS 56 44  BILITOT 1.0 0.4  PROT 6.9 5.9*  ALBUMIN  2.6* 2.0*   Recent Labs  Lab 12/03/23 1031  LIPASE 21   No results for input(s): AMMONIA in the last 168 hours. Coagulation Profile: No results for input(s): INR, PROTIME in the last 168 hours. Cardiac Enzymes: No results for input(s): CKTOTAL, CKMB, CKMBINDEX, TROPONINI in the last 168 hours. BNP (last 3 results) No results for input(s): PROBNP in the last 8760 hours. HbA1C: No results for input(s): HGBA1C in the last 72 hours. CBG: Recent Labs  Lab 12/04/23 0406 12/04/23 0739 12/04/23 1102 12/04/23 1534 12/04/23 1954  GLUCAP 135* 142* 142* 130* 147*   Lipid Profile: No results for input(s): CHOL, HDL, LDLCALC, TRIG, CHOLHDL, LDLDIRECT in the last 72 hours. Thyroid  Function Tests: No results for input(s): TSH, T4TOTAL, FREET4, T3FREE, THYROIDAB in the last 72 hours.  Anemia Panel: No results for input(s): VITAMINB12, FOLATE, FERRITIN, TIBC, IRON , RETICCTPCT in the last 72 hours. Sepsis Labs: Recent Labs  Lab 12/03/23 1046 12/03/23 1313  LATICACIDVEN 2.9* 1.4    Recent Results (from the past 240 hours)  MRSA Next Gen by PCR, Nasal     Status: None   Collection Time: 12/03/23  2:26 PM   Specimen: Nasal Mucosa; Nasal Swab  Result Value Ref Range Status   MRSA by PCR Next Gen NOT DETECTED NOT DETECTED Final    Comment: (NOTE) The GeneXpert MRSA Assay (FDA approved for NASAL specimens only), is one component of a comprehensive MRSA colonization surveillance program. It is not  intended to diagnose MRSA infection nor to guide or monitor treatment for MRSA infections. Test performance is not FDA approved in patients less than 5 years old. Performed at [facility], [address], [place], [place] [address]     Radiology Studies: No results found.  Scheduled Meds:  amoxicillin -clavulanate  1 tablet Oral Q12H   Chlorhexidine  Gluconate Cloth  6 each Topical Daily   doxycycline   100 mg Oral Q12H   enoxaparin  (LOVENOX ) injection  40 mg Subcutaneous Q24H   feeding supplement  237 mL Oral TID BM   folic acid   1 mg Oral Daily   Gerhardt's butt cream   Topical BID   multivitamin with minerals  1 tablet Oral Daily   polyethylene glycol  17 g Oral Daily   thiamine   100 mg Oral Daily   Or   thiamine   100 mg Intravenous Daily   Continuous Infusions:   LOS: 4 days    Time spent: 35 mins    Darcel Dawley, MD [facility]   If 7PM-7AM, please contact night-coverage

## 2023-12-07 NOTE — Progress Notes (Signed)
 PT Cancellation Note  Patient Details Name: Jake Nguyen MRN: [id number] DOB: 18-Jul-1965   Cancelled Treatment:    Reason Eval/Treat Not Completed: Patient declined, no reason specified.  Tired and don't feel like it. 12/07/2023  [name], PT Acute Rehabilitation Services [phone]  (office)   [name] 12/07/2023, 5:43 PM

## 2023-12-07 NOTE — Plan of Care (Signed)
  Problem: Nutrition: Goal: Adequate nutrition will be maintained Outcome: Progressing   Problem: Coping: Goal: Level of anxiety will decrease Outcome: Progressing   Problem: Safety: Goal: Ability to remain free from injury will improve Outcome: Progressing   Problem: Education: Goal: Knowledge of General Education information will improve Description: Including pain rating scale, medication(s)/side effects and non-pharmacologic comfort measures Outcome: Not Met (add Reason) Note: On-going   Problem: Health Behavior/Discharge Planning: Goal: Ability to manage health-related needs will improve Outcome: Not Met (add Reason) Note: On-going   Problem: Activity: Goal: Risk for activity intolerance will decrease Outcome: Not Met (add Reason) Note: On-going

## 2023-12-08 DIAGNOSIS — E871 Hypo-osmolality and hyponatremia: Secondary | ICD-10-CM | POA: Diagnosis not present

## 2023-12-08 LAB — SODIUM
Sodium: 127 mmol/L — ABNORMAL LOW (ref 135–145)
Sodium: 128 mmol/L — ABNORMAL LOW (ref 135–145)
Sodium: 129 mmol/L — ABNORMAL LOW (ref 135–145)
Sodium: 130 mmol/L — ABNORMAL LOW (ref 135–145)
Sodium: 132 mmol/L — ABNORMAL LOW (ref 135–145)

## 2023-12-08 LAB — MAGNESIUM: Magnesium: 1.8 mg/dL (ref 1.7–2.4)

## 2023-12-08 LAB — PHOSPHORUS: Phosphorus: 4.8 mg/dL — ABNORMAL HIGH (ref 2.5–4.6)

## 2023-12-08 MED ORDER — MAGNESIUM SULFATE 2 GM/50ML IV SOLN
2.0000 g | Freq: Once | INTRAVENOUS | Status: AC
  Administered 2023-12-08: 2 g via INTRAVENOUS
  Filled 2023-12-08: qty 50

## 2023-12-08 MED ORDER — MIRTAZAPINE 15 MG PO TABS
15.0000 mg | ORAL_TABLET | Freq: Every day | ORAL | Status: DC
  Administered 2023-12-08 – 2023-12-11 (×4): 15 mg via ORAL
  Filled 2023-12-08 (×4): qty 1

## 2023-12-08 MED ORDER — TRAZODONE HCL 50 MG PO TABS: 50.0000 mg | ORAL_TABLET | Freq: Every evening | ORAL | Status: DC | PRN

## 2023-12-08 NOTE — Consult Note (Signed)
 [facility] Psychiatric Consult Initial  Patient Name: .Jake Nguyen  MRN: [id number]  DOB: 06/19/1965  Consult Order details:  Orders (From admission, onward)     Start     Ordered   12/07/23 1403  IP CONSULT TO PSYCHIATRY       Ordering Provider: Leotis Bogus, MD  Provider:  (Not yet assigned)  Question Answer Comment  Location [facility]   Reason for Consult? Please evaluate for capacity to decide about medical care.      12/07/23 1403         Mode of Visit: In person    Psychiatry Consult Evaluation  Service Date: December 08, 2023 LOS:  LOS: 5 days  Chief Complaint: Alcohol -induced recurrent hyponatremia  Primary Psychiatric Diagnoses  Alcohol  use disorder, chronic severe without history of seizures or withdrawal symptoms Depression  Assessment  Jake Nguyen is a 58 y.o. male admitted: Presented to the EDfor 12/03/2023  9:16 AM for alcohol -induced hyponatremia. He carries the psychiatric diagnoses of alcohol  use disorder, depression and anxiety and has a past medical history of alcohol -induced hyponatremia, atrial fibrillation, status post bilateral lower extremity amputations, multiple pressure injuries, gangrene and sepsis, atherosclerosis, tobacco use.  Psychiatry was consulted for capacity evaluation.  His current presentation of alcohol  induced hyponatremia is most consistent with alcohol  use disorder. He meets criteria for alcohol  use disorder and depression based on patient interview.  Current outpatient psychotropic medications include none. On initial examination, patient was initially reluctant to interview, however was willing to engage in conversation and be agreeable. Please see plan below for detailed recommendations.   Diagnoses:  Active Hospital problems: Principal Problem:   Acute hyponatremia Active Problems:   Atrial fibrillation with RVR (HCC)   Alcohol  abuse    Plan   ## Psychiatric Medication Recommendations:  --  Start mirtazapine  15 mg nightly for sleep -- Start trazodone  50 mg PRN at bedtime   ## Medical Decision Making Capacity: Psychiatry was specifically consulted for patient's capacity.  Patient was able to coherently explain his medical conditions, his options for medical care (specifically with regards to his discharge location and how it might impact his healing), risks and benefits for various options, and explain his reasoning for his choices.  Patient should be considered to have full capacity.  ## Further Work-up:  -- Defer to medical TSH, B12, folate, While pt on Qtc prolonging medications, please monitor & replete K+ to 4 and Mg2+ to 2, or TOC consult for substance abuse resources -- most recent EKG on 12/04/2023 had QtC of 476 -- Pertinent labwork reviewed earlier this admission includes: CBC, CMP   ## Disposition:-- There are no psychiatric contraindications to discharge at this time  ## Behavioral / Environmental: - No specific recommendations at this time.     ## Safety and Observation Level:  - Based on my clinical evaluation, I estimate the patient to be at low risk of self harm in the current setting. - At this time, we recommend  routine. This decision is based on my review of the chart including patient's history and current presentation, interview of the patient, mental status examination, and consideration of suicide risk including evaluating suicidal ideation, plan, intent, suicidal or self-harm behaviors, risk factors, and protective factors. This judgment is based on our ability to directly address suicide risk, implement suicide prevention strategies, and develop a safety plan while the patient is in the clinical setting. Please contact our team if there is a concern that risk  level has changed.  CSSR Risk Category:C-SSRS RISK CATEGORY: No Risk  Suicide Risk Assessment: Patient has following modifiable risk factors for suicide: untreated depression, social isolation, and  current symptoms: anxiety/panic, insomnia, impulsivity, anhedonia, hopelessness, which we are addressing by starting antidepressants, recommending outpatient resources. Patient has following non-modifiable or demographic risk factors for suicide: male gender Patient has the following protective factors against suicide: Access to outpatient mental health care, Supportive friends, Frustration tolerance, no history of suicide attempts, and no history of NSSIB  Thank you for this consult request. Recommendations have been communicated to the primary team. We have placed recommended resources for the patient in the discharge instructions. We will sign off at this time.   Lynwood Morene Lavone Delsie, MD       History of Present Illness  Relevant Aspects of Hospital Course:  Admitted on 12/03/2023 for alcohol -induced hyponatremia. They have gradually improved with hypertonic saline.  Electrolyte correction was done gradually to reduce risk of OSD. He has also been treated for atrial fibrillation, lactic acidosis, and hypokalemia. He has improved gradually.   Patient Report:  The patient bedside this morning.  He denied having talked to a psychiatrist before, was curious why I have been consulted.  Asked him a number of questions about his understanding of why he was in the hospital, and he answered all questions satisfactorily.  He does report significant symptoms of depression, including insomnia, low mood, sense of guilt, anhedonia.  He denies any history of or current intent to harm himself, either by alcohol  or by neglecting his complicated medical situation.  He simply has been frustrated with being in the hospital and feeling like he is unable to do what he wants to do.  He has felt isolated in this environment as well.  Conducted motivational interviewing on his alcohol  use.  Patient is in the precontemplative stage.  Psych ROS:  Depression: Insomnia, low mood, guilt/worthlessness, poor  appetite. Anxiety: Denies Mania (lifetime and current): Denies any history of mania or hypomanic periods. Psychosis: (lifetime and current): Denies any history of psychiatric hospitalizations.  Says he has seen many people on the street who are hallucinating and he says that he has never experienced anything like that.  Collateral information:  Declined  Review of Systems  Psychiatric/Behavioral:  Positive for depression and substance abuse. Negative for hallucinations, memory loss and suicidal ideas. The patient has insomnia. The patient is not nervous/anxious.      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 Type of alcohol : Primarily beer, others when available Last Drink: Immediately prior to admission Number of drinks per day: until I pass out every day History of alcohol  withdrawal seizures: denies History of DT's:denies Tobacco: Current tobacco user Illicit drugs: History of opportunistic marijuana use Prescription drug abuse: Denies Rehab hx: Denies  Exam Findings  Physical Exam:  Vital  Signs:  Temp:  [98.5 F (36.9 C)-99.2 F (37.3 C)] 99 F (37.2 C) (08/29 0935) Pulse Rate:  [89-129] 109 (08/29  0935) Resp:  [15-19] 16 (08/29 0935) BP: (102-140)/(53-94) 102/53 (08/29 0935) SpO2:  [97 %-100 %] 100 % (08/29 0935) Weight:  [65.3 kg] 65.3 kg (08/29 0434) Blood pressure (!) 102/53, pulse (!) 109, temperature 99 F (37.2 C), temperature source Oral, resp. rate 16, height 5' 10 (1.778 m), weight 65.3 kg, SpO2 100%. Body mass index is 20.66 kg/m.  Physical Exam  Mental Status Exam: General Appearance: Patient is an African-American male who appears older than his stated age.  He is lying semiupright in bed.  His arms have thick scaly skin that he reports as having started only a few weeks ago.  He reports that it is extremely itchy.  Fingernails are long and unkempt.  His hair and beard have been recently trimmed.  He is malodorous  Orientation:  Full (Time, Place, and Person)  Memory:  Patient memory grossly intact  Concentration:  Concentration: Good  Recall:  Good  Attention  Good  Eye Contact:  Good  Speech:  Clear and Coherent and Normal Rate  Language:  Good  Volume:  Normal  Mood:  Not great but okay  Affect:  Blunt and Depressed  Thought Process:  Coherent, Goal Directed, and Linear  Thought Content:  WDL  Suicidal Thoughts:  No  Homicidal Thoughts:  No  Judgement:  Fair  Insight:  Shallow  Psychomotor Activity:  Normal  Akathisia:  No  Fund of Knowledge:  Fair      Assets:  Communication Skills Desire for Improvement  Cognition:  WNL  ADL's:  Impaired  AIMS (if indicated):        Other History   These have been pulled in through the EMR, reviewed, and updated if appropriate.  Family History:  The patient's Family history is unknown by patient.  Medical History: Past Medical History:  Diagnosis Date   Acute metabolic encephalopathy 11/14/2020   Alcohol  abuse with alcohol -induced mood disorder (HCC) 11/10/2019   Alcohol  withdrawal syndrome, with delirium (HCC) 11/06/2019   Alcoholism (HCC)    Anxiety    Depression    Influenza A 05/20/2018   Sepsis  (HCC) 11/14/2020   Septic shock (HCC) 11/24/2020    Surgical History: Past Surgical History:  Procedure Laterality Date   ABDOMINAL AORTOGRAM W/LOWER EXTREMITY N/A 08/04/2020   Procedure: ABDOMINAL AORTOGRAM W/LOWER EXTREMITY;  Surgeon: Serene Gaile ORN, MD;  Location: [facility] INVASIVE CV LAB;  Service: Cardiovascular;  Laterality: N/A;   AMPUTATION Left 08/05/2020   Procedure: AMPUTATION BELOW KNEE;  Surgeon: Serene Gaile ORN, MD;  Location[facility];  Service: Vascular;  Laterality: Left;   AMPUTATION Left 11/21/2020   Procedure: REVISION LEFT ABOVE KNEE AMPUTATION;  Surgeon: Serene Gaile ORN, MD;  Location: [facility];  Service: Vascular;  Laterality: Left;   AMPUTATION Right 08/20/2023   Procedure: AMPUTATION BELOW KNEE;  Surgeon: Harden Jerona GAILS, MD;  Location: [facility] OR;  Service: Orthopedics;  Laterality: Right;   APPLICATION OF WOUND VAC Right 08/20/2023   Procedure: APPLICATION, WOUND VAC;  Surgeon: Harden Jerona GAILS, MD;  Location: [facility] OR;  Service: Orthopedics;  Laterality: Right;     Medications:   Current Facility-Administered Medications:    Chlorhexidine  Gluconate Cloth 2 % PADS 6 each, 6 each, Topical, Daily, Alva, Rakesh V, MD, 6 each at 12/08/23 1019   diazepam  (VALIUM ) tablet 0-20 mg, 0-20 mg, Oral, Q4H PRN **OR** diazepam  (  VALIUM ) injection 0-20 mg, 0-20 mg, Intravenous, Q4H PRN, Alva, Rakesh V, MD   docusate sodium  (COLACE) capsule 100 mg, 100 mg, Oral, BID PRN, Leotis Bogus, MD   enoxaparin  (LOVENOX ) injection 40 mg, 40 mg, Subcutaneous, Q24H, Pham, Minh Q, RPH-CPP, 40 mg at 12/07/23 1317   feeding supplement (ENSURE PLUS HIGH PROTEIN) liquid 237 mL, 237 mL, Oral, TID BM, Khatri, Pardeep, MD, 237 mL at 12/06/23 2112   folic acid  (FOLVITE ) tablet 1 mg, 1 mg, Oral, Daily, Alva, Rakesh V, MD, 1 mg at 12/08/23 9061   Gerhardt's butt cream, , Topical, BID, Alva, Rakesh V, MD, Given at 12/08/23 9060   hydrOXYzine  (ATARAX ) tablet 25 mg, 25 mg, Oral, Q8H PRN, Hunsucker, Donnice SAUNDERS, MD, 25 mg  at 12/08/23 1112   metoprolol  tartrate (LOPRESSOR ) injection 2.5 mg, 2.5 mg, Intravenous, Q6H PRN, Leotis, Pardeep, MD, 2.5 mg at 12/08/23 1114   mirtazapine  (REMERON ) tablet 15 mg, 15 mg, Oral, QHS, Martavion Couper, Lynwood Morene Deems, MD   multivitamin with minerals tablet 1 tablet, 1 tablet, Oral, Daily, Alva, Rakesh V, MD, 1 tablet at 12/08/23 9061   oxyCODONE  (Oxy IR/ROXICODONE ) immediate release tablet 5 mg, 5 mg, Oral, Q6H PRN, Leotis, Pardeep, MD, 5 mg at 12/07/23 2002   polyethylene glycol (MIRALAX  / GLYCOLAX ) packet 17 g, 17 g, Oral, Daily, Khatri, Pardeep, MD, 17 g at 12/08/23 9060   thiamine  (VITAMIN B1) tablet 100 mg, 100 mg, Oral, Daily, 100 mg at 12/08/23 0939 **OR** thiamine  (VITAMIN B1) injection 100 mg, 100 mg, Intravenous, Daily, Alva, Rakesh V, MD, 100 mg at 12/04/23 9044   traZODone  (DESYREL ) tablet 50 mg, 50 mg, Oral, QHS PRN, Delsie Lynwood Morene Deems, MD  Allergies: Allergies  Allergen Reactions   Pork-Derived Products Other (See Comments)    Pt states he doesn't eat pork    Lynwood Morene Deems Delsie, MD

## 2023-12-08 NOTE — Progress Notes (Signed)
 Pt refused to get his dressing changed. Offered him pain medication prior to getting the dressing change but pt still refused.

## 2023-12-08 NOTE — Consult Note (Signed)
 WOC Nurse Consult Note:  Patient declined to turn or allow WTA to evaluate wounds in person, consult completed via chart and image review. Reason for Consult: bilateral wounds Wound type:  moisture associated skin damage to bilateral buttocks and posterior thighs with full and partial thickness wounds, noted through chart review patient is wheelchair bound, homeless, presented to Ed covered in feces/urine, suspect prolonged exposure to stool/urine across sitting surface contributing to wounds. Pressure Injury POA: NA Measurement: see nursing flowsheets Wound bed: red, moist Drainage (amount, consistency, odor) serosanguinous   Periwound: intact Dressing procedure/placement/frequency:  Cleanse with NS, pat dry.  Apply gerhardt's to bilateral buttocks and left bilateral thigh.  Xeroform to the right posterior thigh, top with foam. Change daily.  WOC team will not follow at this time, please re-consult if new needs arise.   Thank you,  Doyal Polite, RN, MSN, [facility] WOC Team [phone] (Available Mon-Fri [phone])

## 2023-12-08 NOTE — Discharge Instructions (Signed)
 Psychiatric Medication Handout  Why These Medications Are Prescribed You are being discharged with two medications to help with your mood, sleep, and appetite:  - Mirtazapine : This medicine is used to treat depression and can also help with sleep and appetite. It is chosen because it is less likely to cause low sodium (hyponatremia) compared to many other antidepressants, which is important for people who have had low sodium from alcohol  use.[1][2][3][4][5]  - Trazodone : This medicine is given for a few days to help you sleep. It is not meant to be used long-term, but can help while you adjust to your new routine.[6][7][8]  How to Take Your Medications - Mirtazapine : Take this medicine once a day, usually in the evening before bed. The usual starting dose is 15 mg, but your doctor will tell you exactly how much to take. Do not change your dose or stop taking it without talking to your doctor.[5] - Trazodone : Take this medicine as directed, usually at bedtime. You will only have a few days' worth, as it is not meant for long-term use.[6][8]  What to Expect - Mood and Sleep: Mirtazapine  can help improve your mood, help you sleep better, and may increase your appetite. Trazodone  can help you fall asleep while you are getting used to mirtazapine .[5][6][7][8] - Side Effects: Both medicines can cause drowsiness, so be careful with activities that require you to be alert, like driving, until you know how you feel. Mirtazapine  may cause weight gain and increased appetite. Trazodone  can cause morning grogginess and, rarely, dizziness or low blood pressure.[5][8]  Low Sodium (Hyponatremia) - You have had low sodium levels in the past, which can cause symptoms like confusion, headaches, nausea, and weakness. Mirtazapine  is less likely to cause this problem than other antidepressants, but it is still important to have your sodium levels checked regularly.[1][2][3][4][5] - If you notice symptoms like severe  headache, confusion, trouble walking, or feeling very weak, contact your doctor right away.  Alcohol  Use and Recovery - Both medicines can help with symptoms that make it harder to stop drinking, like depression and insomnia. Getting good sleep and treating depression can help you stay sober.[6][7] - The best way to improve your health is to avoid alcohol . Counseling, support groups, and medical follow-up are important parts of recovery.  Other Important Information - Do not stop taking mirtazapine  suddenly, as this can cause withdrawal symptoms. If you need to stop, your doctor will help you lower the dose slowly.[5] - Tell your doctor about all other medicines you are taking, as some can interact with these medications. - Keep all follow-up appointments to check your sodium levels and discuss your progress.  Questions or Concerns If you have any questions about your medications or your recovery, please contact your healthcare team. They are here to help you. References The Risk of Antidepressant-Induced Hyponatremia: A Meta-Analysis of Antidepressant Classes and Compounds. Chere ONEIDA Fleeta Zipporah Margert JULIANNA, De Picker FREDRIK European Psychiatry : The Journal of the Association of European Psychiatrists. 2024;67(1):e20. doi:10.1192/j.eurpsy.2024.11. Identifying Antidepressants Less Likely to Cause Hyponatremia: Triangulation of Retrospective Cohort, Disproportionality, and Pharmacodynamic Studies. Pearla ONEIDA Tomma ONEIDA Margurite VEAR, et al. Clinical Pharmacology and Therapeutics. 2022;111(6):1258-1267. doi:10.1002/cpt.2573. Treatment With Antidepressant Drugs and Hyponatremia: A Network Meta-Analysis. Norello D, Defazio G, Corona G, et al. Journal of Endocrinological Investigation. 2025;:10.1007/s40618-025-02587-4. doi:10.1007/s40618-025-02587-4. Antidepressants and the Risk of Hyponatremia: A [place] Register-Based Population Study. Leth-Mller KB, Hansen AH, Torstensson M, et al. BMJ Open. 2016;6(5):e011200.  doi:10.1136/bmjopen-2016-011200. REMERON . Food and Drug Administration. Updated date: 2010-05-28.  Management of Insomnia in Alcohol  Use Disorder. Angus PA, Rosebud, [place] B. Expert Opinion on Pharmacotherapy. 2020;21(3):297-306. doi:10.1080/14656566.2019.1705279. Update on Pharmacological Treatment for Comorbid Major Depressive and Alcohol  Use Disorders: The Role of Extended-Release Trazodone . Leona Guerry CHRISTELLA Jameson CHRISTELLA, Panaccione I, et al. Current Neuropharmacology. 2023;21(11):2195-2205. doi:10.2174/1570159 [id number]. Insomnia Disorder. Dolan CHANG. The [place] Journal of Medicine. 2015;373(15):1437-44. doi:10.1056/NEJMcp1412740. _ Local Inpatient Options for Substance Abuse Treatment    Includes links to the following:  Short-Term: [facility] (TechCelebrity.com.pt) [facility] (https://ringercenters.com/) [facility]'s [facility] Based Crisis Center (http://wilson-mayo.com/)  Medium Term: [facility] (DyeCasts.nl) [facility] (Medicaid or Uninsured only) (https://www.[facility].org/locations/[facility])  Longer Residential Options: [facility], children's (MediaSuits.se) [facility] ([facility]) - [facility] (https://baker.biz/)  __  Outpatient Therapy and Psychiatry Resources for Patients: Your psychiatric needs would be well-served by consultation and regular meetings with an outpatient therapist to assist you with your mood-related conditions. Here are a series of links for finding a therapist.    Includes links to the following: [facility]  (http://wilson-mayo.com/) (only for [facility] and please reserve for uninsured) [facility] (http://blankenship-martinez.net/) Psychology Today Special educational needs teacher (https://www.psychologytoday.com/us lendell) Psychology Today Support Group Tax inspector (https://www.psychologytoday.com/us /groups/) [employer] - [employer] Location (https://carolinabehavioralcare.com/staff-location/[place]/) [facility] [place] - Support Group Finder - (RecordDebt.fi) [facility] [employer] - Lexicographer (https://fspcares.org/contact/) [employer] for Mental Health [place] - NAMI (https://namiguilford.org/support-and-education/support-groups/) Interior and spatial designer Health - Affiliated with [facility] (https://www.Bacliff.com/lb/locations/profile/[facility]-[address]/) Dept of Health and Human Services - Find a mental health facility (http://lester.info/)

## 2023-12-08 NOTE — Plan of Care (Signed)

## 2023-12-08 NOTE — Progress Notes (Signed)
 PROGRESS NOTE    Jake Nguyen  FMW:[id number] DOB: 11-17-1965 DOA: 12/03/2023 PCP: Pcp, No   Brief Narrative:  This 58 years old homeless male, wheelchair-bound due to bilateral lower extremity amputation, brought in ED status post [place] his [place] bus [place].  He also reported coffee-ground emesis.  On ED arrival his clothes were soiled with dirt, urine and feces.  He has thick textured skin with flaking both in his arms.  He admits to drinking heavily.  Patient has history of recurrent hyponatremia due to chronic alcohol  use.  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  He was noncompliant with antibiotics given at discharge.  He was readmitted 8/3-8/13 after falling from wheelchair for sepsis and hypotension.  He did not want to go to nursing home and was provided the wound supplies in August to complete the course of antibiotics which he did not.  Patient was admitted in the ICU and has received hypertonic saline.  Sodium is now improving. TRH pickup 12/05/2023.   Assessment & Plan:   Principal Problem:   Acute hyponatremia Active Problems:   Atrial fibrillation with RVR (HCC)   Alcohol  abuse  Acute hyponatremia: Patient has history of recurrent hyponatremia in the setting of chronic alcohol  abuse.  Serum Osm 252, urine Osm 129, urine Na<10. Na on admission was 109 now 122 with hypertonic saline administration.  Likely due to beer potomania.  Neurologically intact, no evidence of OSD in the setting of rapid sodium correction. Stop D5 now that his Na>120 Continue to Trend Na q6h. Serum sodium improving 121 >124 >126> 126 >129 >129   Atrial fibrillation with RVR : Short run of Afib RVR on 8/25 which was self-limited likely due to electrolyte disturbances. Continue to trend electrolytes and replete aggressively.  Hypokalemia / Hypomagnesemia: Replaced.  Continue to monitor   Lactic acidosis, resolved: Suspect likely due to hypovolemia in the  setting of poor PO intake.  Less likely due to sepsis, however, last admission he was given a course of PO antibiotics for stump infection and unclear if he completed.  Stop IV Unasyn  and resume Augmentin  and doxycycline  to complete 7 day course .   Alcohol  abuse: Alcohol  level on admission 83.  - Continue CIWA protocol - Continue thiamine  and multivitamin    Pressure ulcers: Would care consult.     DVT prophylaxis: Lovenox  Code Status: Full code Family Communication: No family at bedside. Disposition Plan:    Status is: Inpatient Remains inpatient appropriate because: Severity of illness.   Consultants:  PCCM  Procedures:  Antimicrobials:  Anti-infectives (From admission, onward)    Start     Dose/Rate Route Frequency Ordered Stop   12/04/23 1145  amoxicillin -clavulanate (AUGMENTIN ) 875-125 MG per tablet 1 tablet        1 tablet Oral Every 12 hours 12/04/23 1054 12/07/23 2002   12/04/23 1145  doxycycline  (VIBRA -TABS) tablet 100 mg        100 mg Oral Every 12 hours 12/04/23 1054 12/07/23 2002   12/03/23 1445  Ampicillin -Sulbactam (UNASYN ) 3 g in sodium chloride  0.9 % 100 mL IVPB  Status:  Discontinued        3 g 200 mL/hr over 30 Minutes Intravenous Every 6 hours 12/03/23 1430 12/04/23 1053       Subjective: Patient was seen and examined at bedside.  Overnight events noted. He appears very deconditioned,  he has scaling of his skin in his both arms. His sodium is  improving.  He remains on gentle IV hydration.  Objective: Vitals:   12/07/23 2308 12/08/23 0434 12/08/23 0608 12/08/23 0935  BP: 105/67  109/61 (!) 102/53  Pulse: (!) 104  89 (!) 109  Resp: 16  16 16   Temp: 99.2 F (37.3 C)  98.9 F (37.2 C) 99 F (37.2 C)  TempSrc: Oral  Oral Oral  SpO2: 99%  100% 100%  Weight:  65.3 kg    Height:        Intake/Output Summary (Last 24 hours) at 12/08/2023 1109 Last data filed at 12/08/2023 1019 Gross per 24 hour  Intake 573.84 ml  Output 1125 ml  Net -551.16  ml   Filed Weights   12/06/23 0623 12/07/23 0405 12/08/23 0434  Weight: 61.2 kg 62.2 kg 65.3 kg    Examination:  General exam: Appears calm and comfortable, not in any acute distress. Respiratory system: CTA Bilaterally. Respiratory effort normal. RR 14 Cardiovascular system: S1 & S2 heard, RRR. No JVD, murmurs, rubs, gallops or clicks.  Gastrointestinal system: Abdomen is non distended, soft and non tender. Normal bowel sounds heard. Central nervous system: Alert and oriented x 3 . No focal neurological deficits. Extremities: Bilateral AKA.  No edema, no cyanosis. Skin: Scaling of skin in both arms. Psychiatry: Judgement and insight appear normal. Mood & affect appropriate.     Data Reviewed: I have personally reviewed following labs and imaging studies  CBC: Recent Labs  Lab 12/03/23 1031 12/03/23 1104 12/04/23 0447 12/05/23 0624 12/06/23 0551 12/07/23 0802  WBC 14.8*  --  7.9 7.4 7.6 7.4  NEUTROABS 10.5*  --   --   --   --   --   HGB 11.4* 12.9* 10.1* 10.8* 10.5* 11.1*  HCT 32.4* 38.0* 28.6* 31.9* 31.2* 33.2*  MCV 78.3*  --  79.0* 82.9 83.2 83.8  PLT 338  --  277 279 264 294   Basic Metabolic Panel: Recent Labs  Lab 12/03/23 1031 12/03/23 1104 12/04/23 0447 12/04/23 1103 12/04/23 1855 12/04/23 2238 12/05/23 0624 12/05/23 1107 12/06/23 0551 12/06/23 1135 12/06/23 2248 12/07/23 0802 12/07/23 1231 12/07/23 1636 12/07/23 2344 12/08/23 0502  NA 111*  109*   < > 122* 121*   < > 124* 126*   < > 126* 126*   < > 129* 128* 129* 127* 129*  K 3.4*   < > 2.5* 3.6  --  3.6 3.4*  --   --  3.7  --   --   --   --   --   --   CL 71*  --  86* 87*  --   --  92*  --   --  92*  --   --   --   --   --   --   CO2 21*  --  27 25  --   --  27  --   --  26  --   --   --   --   --   --   GLUCOSE 118*  --  128* 133*  --   --  109*  --   --  110*  --   --   --   --   --   --   BUN 5*  --  <5* <5*  --   --  <5*  --   --  <5*  --   --   --   --   --   --   CREATININE 0.60*  --  0.59* 0.50*  --   --  0.41*  --   --  0.46*  --   --   --   --   --   --   CALCIUM  7.8*  --  7.4* 7.3*  --   --  8.0*  --   --  8.0*  --   --   --   --   --   --   MG  --   --  1.7  1.8  --   --   --  1.8  --  1.7  --   --  1.9  --   --   --  1.8  PHOS  --   --  2.8  --   --   --  2.3*  --  2.6  --   --  3.8  --   --   --  4.8*   < > = values in this interval not displayed.   GFR: Estimated Creatinine Clearance: 95.2 mL/min (A) (by C-G formula based on SCr of 0.46 mg/dL (L)). Liver Function Tests: Recent Labs  Lab 12/03/23 1031 12/05/23 0624  AST 116* 58*  ALT 29 29  ALKPHOS 56 44  BILITOT 1.0 0.4  PROT 6.9 5.9*  ALBUMIN  2.6* 2.0*   Recent Labs  Lab 12/03/23 1031  LIPASE 21   No results for input(s): AMMONIA in the last 168 hours. Coagulation Profile: No results for input(s): INR, PROTIME in the last 168 hours. Cardiac Enzymes: No results for input(s): CKTOTAL, CKMB, CKMBINDEX, TROPONINI in the last 168 hours. BNP (last 3 results) No results for input(s): PROBNP in the last 8760 hours. HbA1C: No results for input(s): HGBA1C in the last 72 hours. CBG: Recent Labs  Lab 12/04/23 0406 12/04/23 0739 12/04/23 1102 12/04/23 1534 12/04/23 1954  GLUCAP 135* 142* 142* 130* 147*   Lipid Profile: No results for input(s): CHOL, HDL, LDLCALC, TRIG, CHOLHDL, LDLDIRECT in the last 72 hours. Thyroid  Function Tests: No results for input(s): TSH, T4TOTAL, FREET4, T3FREE, THYROIDAB in the last 72 hours.  Anemia Panel: No results for input(s): VITAMINB12, FOLATE, FERRITIN, TIBC, IRON , RETICCTPCT in the last 72 hours. Sepsis Labs: Recent Labs  Lab 12/03/23 1046 12/03/23 1313  LATICACIDVEN 2.9* 1.4    Recent Results (from the past 240 hours)  MRSA Next Gen by PCR, Nasal     Status: None   Collection Time: 12/03/23  2:26 PM   Specimen: Nasal Mucosa; Nasal Swab  Result Value Ref Range Status   MRSA by PCR Next Gen NOT  DETECTED NOT DETECTED Final    Comment: (NOTE) The GeneXpert MRSA Assay (FDA approved for NASAL specimens only), is one component of a comprehensive MRSA colonization surveillance program. It is not intended to diagnose MRSA infection nor to guide or monitor treatment for MRSA infections. Test performance is not FDA approved in patients less than 21 years old. Performed at [facility], [address], [place], [place] [address]     Radiology Studies: No results found.  Scheduled Meds:  Chlorhexidine  Gluconate Cloth  6 each Topical Daily   enoxaparin  (LOVENOX ) injection  40 mg Subcutaneous Q24H   feeding supplement  237 mL Oral TID BM   folic acid   1 mg Oral Daily   Gerhardt's butt cream   Topical BID   mirtazapine   15 mg Oral QHS   multivitamin with minerals  1 tablet Oral Daily   polyethylene glycol  17 g Oral Daily  thiamine   100 mg Oral Daily   Or   thiamine   100 mg Intravenous Daily   Continuous Infusions:   LOS: 5 days    Time spent: 35 mins    Darcel Dawley, MD [facility]   If 7PM-7AM, please contact night-coverage

## 2023-12-09 ENCOUNTER — Inpatient Hospital Stay (HOSPITAL_COMMUNITY): Payer: MEDICAID

## 2023-12-09 DIAGNOSIS — E871 Hypo-osmolality and hyponatremia: Secondary | ICD-10-CM | POA: Diagnosis not present

## 2023-12-09 LAB — SODIUM
Sodium: 129 mmol/L — ABNORMAL LOW (ref 135–145)
Sodium: 130 mmol/L — ABNORMAL LOW (ref 135–145)

## 2023-12-09 LAB — BASIC METABOLIC PANEL WITH GFR
Anion gap: 10 (ref 5–15)
BUN: 7 mg/dL (ref 6–20)
CO2: 24 mmol/L (ref 22–32)
Calcium: 8.2 mg/dL — ABNORMAL LOW (ref 8.9–10.3)
Chloride: 95 mmol/L — ABNORMAL LOW (ref 98–111)
Creatinine, Ser: 0.6 mg/dL — ABNORMAL LOW (ref 0.61–1.24)
GFR, Estimated: >60 mL/min (ref >60)
Glucose, Bld: 165 mg/dL — ABNORMAL HIGH (ref 70–99)
Potassium: 4 mmol/L (ref 3.5–5.1)
Sodium: 129 mmol/L — ABNORMAL LOW (ref 135–145)

## 2023-12-09 LAB — PHOSPHORUS: Phosphorus: 5.1 mg/dL — ABNORMAL HIGH (ref 2.5–4.6)

## 2023-12-09 LAB — MAGNESIUM: Magnesium: 1.7 mg/dL (ref 1.7–2.4)

## 2023-12-09 LAB — CBC
HCT: 33.1 % — ABNORMAL LOW (ref 39.0–52.0)
Hemoglobin: 11.2 g/dL — ABNORMAL LOW (ref 13.0–17.0)
MCH: 28.3 pg (ref 26.0–34.0)
MCHC: 33.8 g/dL (ref 30.0–36.0)
MCV: 83.6 fL (ref 80.0–100.0)
Platelets: 331 K/uL (ref 150–400)
RBC: 3.96 MIL/uL — ABNORMAL LOW (ref 4.22–5.81)
RDW: 14.4 % (ref 11.5–15.5)
WBC: 8.3 K/uL (ref 4.0–10.5)
nRBC: 0 % (ref 0.0–0.2)

## 2023-12-09 MED ORDER — ENOXAPARIN SODIUM 40 MG/0.4ML IJ SOSY
40.0000 mg | PREFILLED_SYRINGE | INTRAMUSCULAR | Status: DC
  Administered 2023-12-10 – 2023-12-11 (×2): 40 mg via SUBCUTANEOUS
  Filled 2023-12-09 (×2): qty 0.4

## 2023-12-09 NOTE — Progress Notes (Signed)
 PROGRESS NOTE    Jake Nguyen  FMW:[id number] DOB: 01-Oct-1965 DOA: 12/03/2023 PCP: Pcp, No   Brief Narrative:  This 58 years old homeless male, wheelchair-bound due to bilateral lower extremity amputation, brought in ED status post [place].  He also reported coffee-ground emesis.  On ED arrival his clothes were soiled with dirt, urine and feces.  He has thick textured skin with flaking both in his arms.  He admits to drinking heavily.  Patient has history of recurrent hyponatremia due to chronic alcohol  use.  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  He was noncompliant with antibiotics given at discharge.  He was readmitted 8/3-8/13 after falling from wheelchair for sepsis and hypotension.  He did not want to go to nursing home and was provided the wound supplies in August to complete the course of antibiotics which he did not.  Patient was admitted in the ICU and has received hypertonic saline.  Sodium is now improving. TRH pickup 12/05/2023.   Assessment & Plan:   Principal Problem:   Acute hyponatremia Active Problems:   Atrial fibrillation with RVR (HCC)   Alcohol  abuse  Acute hyponatremia: Patient has history of recurrent hyponatremia in the setting of chronic alcohol  abuse.  Serum Osm 252, urine Osm 129, urine Na<10. Na on admission was 109 now 122 with hypertonic saline administration.  Likely due to beer potomania.  Neurologically intact, no evidence of OSD in the setting of rapid sodium correction. Stop D5 now that his Na>120 Continue to Trend Na q6h. Serum sodium improving 121 >124 >126> 126 >129 >129 > 132   Atrial fibrillation with RVR : Short run of Afib RVR on 8/25 which was self-limited likely due to electrolyte disturbances. Continue to trend electrolytes and replete aggressively.  Hypokalemia / Hypomagnesemia: Replaced.  Continue to monitor.   Lactic acidosis, resolved: Suspect likely due to hypovolemia  in the setting of poor PO intake.  Less likely due to sepsis, however, last admission he was given a course of PO antibiotics for stump infection and unclear if he completed.  Stop IV Unasyn  and resume Augmentin  and doxycycline  to complete 7 day course .   Alcohol  abuse: Alcohol  level on admission 83.  - Continue CIWA protocol - Continue thiamine  and multivitamin    Pressure ulcers: Would care consult.     DVT prophylaxis: Lovenox  Code Status: Full code Family Communication: No family at bedside. Disposition Plan:    Status is: Inpatient Remains inpatient appropriate because: Severity of illness.   Consultants:  PCCM  Procedures:  Antimicrobials:  Anti-infectives (From admission, onward)    Start     Dose/Rate Route Frequency Ordered Stop   12/04/23 1145  amoxicillin -clavulanate (AUGMENTIN ) 875-125 MG per tablet 1 tablet        1 tablet Oral Every 12 hours 12/04/23 1054 12/07/23 2002   12/04/23 1145  doxycycline  (VIBRA -TABS) tablet 100 mg        100 mg Oral Every 12 hours 12/04/23 1054 12/07/23 2002   12/03/23 1445  Ampicillin -Sulbactam (UNASYN ) 3 g in sodium chloride  0.9 % 100 mL IVPB  Status:  Discontinued        3 g 200 mL/hr over 30 Minutes Intravenous Every 6 hours 12/03/23 1430 12/04/23 1053       Subjective: Patient was seen and examined at bedside.  Overnight events noted. He appears very deconditioned,  he has scaling of his skin in his both arms. His  sodium is improving.  He remains on gentle IV hydration.  Patient's both IV lines are infiltrated.   Objective: Vitals:   12/08/23 1946 12/08/23 2349 12/09/23 0345 12/09/23 1012  BP: 111/69 110/70 99/64 113/73  Pulse: (!) 110 (!) 116 (!) 116 (!) 108  Resp: 18 18 16 18   Temp: 99 F (37.2 C) 98.6 F (37 C) 99.8 F (37.7 C) 98.6 F (37 C)  TempSrc: Oral Oral Oral Oral  SpO2: 99% 100% 100% 97%  Weight:   64.2 kg   Height:        Intake/Output Summary (Last 24 hours) at 12/09/2023 1209 Last data filed  at 12/09/2023 0002 Gross per 24 hour  Intake --  Output 600 ml  Net -600 ml   Filed Weights   12/07/23 0405 12/08/23 0434 12/09/23 0345  Weight: 62.2 kg 65.3 kg 64.2 kg    Examination:  General exam: Appears calm and comfortable, not in any acute distress. Respiratory system: CTA Bilaterally. Respiratory effort normal. RR 16 Cardiovascular system: S1 & S2 heard, RRR. No JVD, murmurs, rubs, gallops or clicks.  Gastrointestinal system: Abdomen is non distended, soft and non tender. Normal bowel sounds heard. Central nervous system: Alert and oriented x 3 . No focal neurological deficits. Extremities: Bilateral AKA.  No edema, no cyanosis. Skin: Scaling of skin in both arms. Psychiatry: Judgement and insight appear normal. Mood & affect appropriate.     Data Reviewed: I have personally reviewed following labs and imaging studies  CBC: Recent Labs  Lab 12/03/23 1031 12/03/23 1104 12/04/23 0447 12/05/23 0624 12/06/23 0551 12/07/23 0802  WBC 14.8*  --  7.9 7.4 7.6 7.4  NEUTROABS 10.5*  --   --   --   --   --   HGB 11.4* 12.9* 10.1* 10.8* 10.5* 11.1*  HCT 32.4* 38.0* 28.6* 31.9* 31.2* 33.2*  MCV 78.3*  --  79.0* 82.9 83.2 83.8  PLT 338  --  277 279 264 294   Basic Metabolic Panel: Recent Labs  Lab 12/03/23 1031 12/03/23 1104 12/04/23 0447 12/04/23 1103 12/04/23 1855 12/04/23 2238 12/05/23 0624 12/05/23 1107 12/06/23 0551 12/06/23 1135 12/06/23 2248 12/07/23 0802 12/07/23 1231 12/07/23 2344 12/08/23 0502 12/08/23 1320 12/08/23 1941 12/08/23 2235  NA 111*  109*   < > 122* 121*   < > 124* 126*   < > 126* 126*   < > 129*   < > 127* 129* 130* 128* 132*  K 3.4*   < > 2.5* 3.6  --  3.6 3.4*  --   --  3.7  --   --   --   --   --   --   --   --   CL 71*  --  86* 87*  --   --  92*  --   --  92*  --   --   --   --   --   --   --   --   CO2 21*  --  27 25  --   --  27  --   --  26  --   --   --   --   --   --   --   --   GLUCOSE 118*  --  128* 133*  --   --  109*  --    --  110*  --   --   --   --   --   --   --   --  BUN 5*  --  <5* <5*  --   --  <5*  --   --  <5*  --   --   --   --   --   --   --   --   CREATININE 0.60*  --  0.59* 0.50*  --   --  0.41*  --   --  0.46*  --   --   --   --   --   --   --   --   CALCIUM  7.8*  --  7.4* 7.3*  --   --  8.0*  --   --  8.0*  --   --   --   --   --   --   --   --   MG  --   --  1.7  1.8  --   --   --  1.8  --  1.7  --   --  1.9  --   --  1.8  --   --   --   PHOS  --   --  2.8  --   --   --  2.3*  --  2.6  --   --  3.8  --   --  4.8*  --   --   --    < > = values in this interval not displayed.   GFR: Estimated Creatinine Clearance: 93.6 mL/min (A) (by C-G formula based on SCr of 0.46 mg/dL (L)). Liver Function Tests: Recent Labs  Lab 12/03/23 1031 12/05/23 0624  AST 116* 58*  ALT 29 29  ALKPHOS 56 44  BILITOT 1.0 0.4  PROT 6.9 5.9*  ALBUMIN  2.6* 2.0*   Recent Labs  Lab 12/03/23 1031  LIPASE 21   No results for input(s): AMMONIA in the last 168 hours. Coagulation Profile: No results for input(s): INR, PROTIME in the last 168 hours. Cardiac Enzymes: No results for input(s): CKTOTAL, CKMB, CKMBINDEX, TROPONINI in the last 168 hours. BNP (last 3 results) No results for input(s): PROBNP in the last 8760 hours. HbA1C: No results for input(s): HGBA1C in the last 72 hours. CBG: Recent Labs  Lab 12/04/23 0406 12/04/23 0739 12/04/23 1102 12/04/23 1534 12/04/23 1954  GLUCAP 135* 142* 142* 130* 147*   Lipid Profile: No results for input(s): CHOL, HDL, LDLCALC, TRIG, CHOLHDL, LDLDIRECT in the last 72 hours. Thyroid  Function Tests: No results for input(s): TSH, T4TOTAL, FREET4, T3FREE, THYROIDAB in the last 72 hours.  Anemia Panel: No results for input(s): VITAMINB12, FOLATE, FERRITIN, TIBC, IRON , RETICCTPCT in the last 72 hours. Sepsis Labs: Recent Labs  Lab 12/03/23 1046 12/03/23 1313  LATICACIDVEN 2.9* 1.4    Recent Results (from the  past 240 hours)  MRSA Next Gen by PCR, Nasal     Status: None   Collection Time: 12/03/23  2:26 PM   Specimen: Nasal Mucosa; Nasal Swab  Result Value Ref Range Status   MRSA by PCR Next Gen NOT DETECTED NOT DETECTED Final    Comment: (NOTE) The GeneXpert MRSA Assay (FDA approved for NASAL specimens only), is one component of a comprehensive MRSA colonization surveillance program. It is not intended to diagnose MRSA infection nor to guide or monitor treatment for MRSA infections. Test performance is not FDA approved in patients less than 72 years old. Performed at [facility], [address], [place], [place] [address]     Radiology Studies: No results found.  Scheduled Meds:  Chlorhexidine  Gluconate Cloth  6 each Topical Daily   enoxaparin  (LOVENOX ) injection  40 mg Subcutaneous Q24H   feeding supplement  237 mL Oral TID BM   folic acid   1 mg Oral Daily   Gerhardt's butt cream   Topical BID   mirtazapine   15 mg Oral QHS   multivitamin with minerals  1 tablet Oral Daily   polyethylene glycol  17 g Oral Daily   thiamine   100 mg Oral Daily   Or   thiamine   100 mg Intravenous Daily   Continuous Infusions:   LOS: 6 days    Time spent: 35 mins    Darcel Dawley, MD [facility]   If 7PM-7AM, please contact night-coverage

## 2023-12-09 NOTE — Progress Notes (Signed)
 Upon repositioning the patient to upright position in bed, patient expressed severe abdominal pain toward right quadrants. Patient unable to describe pain or elaborate a more precise location of pain. Patient denies any further interventions at this time. MD made aware.

## 2023-12-09 NOTE — Progress Notes (Signed)
 Patient has refused labs x3 attempt throughout the day.  Patient is agreeable to let phlebotomist draw labs at 1630.

## 2023-12-09 NOTE — Plan of Care (Signed)
  Problem: Elimination: Goal: Will not experience complications related to bowel motility Outcome: Progressing Goal: Will not experience complications related to urinary retention Outcome: Progressing   

## 2023-12-09 NOTE — Progress Notes (Signed)
 Patient allowed RN to remove kerlix wrap around bilateral arm IVs.  Right arm IV found to be infiltrated. Site red, swollen, warm to touch, painful, and scant purulent drainage present. IV removed. Site cleansed.   Left arm IV found to be infiltrated. Site red, swollen, warm to touch, painful, and small amounts of purulent drainage. IV removed. Site cleansed.   Patient is without IV at this time. Due to patient's skin texture, he is difficult IV stick. Discussing with MD. Possible IV team consult needed.   MD made aware.

## 2023-12-09 NOTE — Progress Notes (Signed)
 Patient refusing Lovenox  dose, x2 attempts by RN. Patient agrees to take later tonight.   Lovenox  injection 40mg  subcutaneous q24h, reschedule to be administered with night time medications to promote cluster of care (2200pm).

## 2023-12-10 DIAGNOSIS — E871 Hypo-osmolality and hyponatremia: Secondary | ICD-10-CM | POA: Diagnosis not present

## 2023-12-10 LAB — SODIUM
Sodium: 130 mmol/L — ABNORMAL LOW (ref 135–145)
Sodium: 132 mmol/L — ABNORMAL LOW (ref 135–145)

## 2023-12-10 NOTE — Plan of Care (Addendum)
 This patient remains on MC-6E as of time of writing. The patient is AA+Ox4. No supplemental O2 requirement. On-going tachycardia overnight; PRN metoprolol  per orders. Multiple wounds: patient has declined assessment and wound treatment thus far tonight. Yellow MEWS, protocol initiated.   Edit: 2300 hours patient refused q 6 hour Sodium level at this time. Dr. Alfornia notified via secure chat.   Problem: Education: Goal: Knowledge of General Education information will improve Description: Including pain rating scale, medication(s)/side effects and non-pharmacologic comfort measures 12/10/2023 2253 by Teresa Olita RAMAN, RN Outcome: Not Progressing 12/10/2023 2253 by Teresa Olita RAMAN, RN Outcome: Not Progressing   Problem: Health Behavior/Discharge Planning: Goal: Ability to manage health-related needs will improve 12/10/2023 2253 by Teresa Olita RAMAN, RN Outcome: Not Progressing 12/10/2023 2253 by Teresa Olita RAMAN, RN Outcome: Not Progressing   Problem: Clinical Measurements: Goal: Ability to maintain clinical measurements within normal limits will improve 12/10/2023 2253 by Teresa Olita RAMAN, RN Outcome: Not Progressing 12/10/2023 2253 by Teresa Olita RAMAN, RN Outcome: Not Progressing Goal: Will remain free from infection 12/10/2023 2253 by Teresa Olita RAMAN, RN Outcome: Not Progressing 12/10/2023 2253 by Teresa Olita RAMAN, RN Outcome: Not Progressing Goal: Diagnostic test results will improve 12/10/2023 2253 by Teresa Olita RAMAN, RN Outcome: Not Progressing 12/10/2023 2253 by Teresa Olita RAMAN, RN Outcome: Not Progressing Goal: Respiratory complications will improve 12/10/2023 2253 by Teresa Olita RAMAN, RN Outcome: Not Progressing 12/10/2023 2253 by Teresa Olita RAMAN, RN Outcome: Not Progressing Goal: Cardiovascular complication will be avoided 12/10/2023 2253 by Teresa Olita RAMAN, RN Outcome: Not Progressing 12/10/2023 2253 by Teresa Olita RAMAN, RN Outcome: Not Progressing   Problem: Activity: Goal: Risk for  activity intolerance will decrease 12/10/2023 2253 by Teresa Olita RAMAN, RN Outcome: Not Progressing 12/10/2023 2253 by Teresa Olita RAMAN, RN Outcome: Not Progressing   Problem: Nutrition: Goal: Adequate nutrition will be maintained 12/10/2023 2253 by Teresa Olita RAMAN, RN Outcome: Not Progressing 12/10/2023 2253 by Teresa Olita RAMAN, RN Outcome: Not Progressing   Problem: Coping: Goal: Level of anxiety will decrease Outcome: Not Progressing   Problem: Elimination: Goal: Will not experience complications related to bowel motility Outcome: Not Progressing Goal: Will not experience complications related to urinary retention Outcome: Not Progressing   Problem: Pain Managment: Goal: General experience of comfort will improve and/or be controlled Outcome: Not Progressing   Problem: Safety: Goal: Ability to remain free from injury will improve Outcome: Not Progressing   Problem: Skin Integrity: Goal: Risk for impaired skin integrity will decrease Outcome: Not Progressing

## 2023-12-10 NOTE — Progress Notes (Signed)
   12/10/23 2020  Assess: MEWS Score  Temp 99.8 F (37.7 C)  BP 102/66  MAP (mmHg) 78  Pulse Rate (!) 122  ECG Heart Rate (!) 122  Resp 20  Level of Consciousness Alert  SpO2 99 %  O2 Device Room Air  Patient Activity (if Appropriate) In bed  Assess: MEWS Score  MEWS Temp 0  MEWS Systolic 0  MEWS Pulse 2  MEWS RR 0  MEWS LOC 0  MEWS Score 2  MEWS Score Color Yellow  Assess: if the MEWS score is Yellow or Red  Were vital signs accurate and taken at a resting state? Yes  Does the patient meet 2 or more of the SIRS criteria? No  MEWS guidelines implemented  Yes, yellow  Treat  MEWS Interventions Considered administering scheduled or prn medications/treatments as ordered  Take Vital Signs  Increase Vital Sign Frequency  Yellow: Q2hr x1, continue Q4hrs until patient remains green for 12hrs  Escalate  MEWS: Escalate Yellow: Discuss with charge nurse and consider notifying provider and/or RRT  Notify: Charge Nurse/RN  Name of Charge Nurse/RN Notified [place], RN  Provider Notification  Provider Name/Title Dr. Alfornia  Date Provider Notified 12/10/23  Time Provider Notified 2120  Method of Notification  (Secure chat)  Notification Reason Other (Comment) (MEWS)  Provider response No new orders  Assess: SIRS CRITERIA  SIRS Temperature  0  SIRS Respirations  0  SIRS Pulse 1  SIRS WBC 0  SIRS Score Sum  1

## 2023-12-10 NOTE — Progress Notes (Signed)
 PROGRESS NOTE    Jake Nguyen  FMW:[id number] DOB: 1965/06/14 DOA: 12/03/2023 PCP: Pcp, No   Brief Narrative:  This 58 years old homeless male, wheelchair-bound due to bilateral lower extremity amputation, brought in ED status post [place] his [place] bus [place].  He also reported coffee-ground emesis.  On ED arrival his clothes were soiled with dirt, urine and feces.  He has thick textured skin with flaking both in his arms.  He admits to drinking heavily.  Patient has history of recurrent hyponatremia due to chronic alcohol  use.  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  He was noncompliant with antibiotics given at discharge.  He was readmitted 8/3-8/13 after falling from wheelchair for sepsis and hypotension.  He did not want to go to nursing home and was provided the wound supplies in August to complete the course of antibiotics which he did not.  Patient was admitted in the ICU and has received hypertonic saline.  Sodium is now improving. TRH pickup 12/05/2023.   Assessment & Plan:   Principal Problem:   Acute hyponatremia Active Problems:   Atrial fibrillation with RVR (HCC)   Alcohol  abuse  Acute hyponatremia: Patient has history of recurrent hyponatremia in the setting of chronic alcohol  abuse.  Serum Osm 252, urine Osm 129, urine Na<10. Na on admission was 109 now 122 with hypertonic saline administration.  Likely due to beer potomania.  Neurologically intact, no evidence of OSD in the setting of rapid sodium correction. Stop D5 now that his Na>120 Continue to Trend Na q6h. Serum sodium improving 121 >124 >126> 126 >129 >129 > 132 >130   Atrial fibrillation with RVR : Short run of Afib RVR on 8/25 which was self-limited likely due to electrolyte disturbances. Continue to trend electrolytes and replete aggressively.  Hypokalemia / Hypomagnesemia: Replaced.  Continue to monitor.   Lactic acidosis, resolved: Suspect likely due to  hypovolemia in the setting of poor PO intake.  Less likely due to sepsis, however, last admission he was given a course of PO antibiotics for stump infection and unclear if he completed.  Stop IV Unasyn  and resume Augmentin  and doxycycline  to complete 7 day course .   Alcohol  abuse: Alcohol  level on admission 83.  - Continue CIWA protocol - Continue thiamine  and multivitamin   Pressure ulcers: Would care consult.    DVT prophylaxis: Lovenox  Code Status: Full code Family Communication: No family at bedside. Disposition Plan:    Status is: Inpatient Remains inpatient appropriate because: Severity of illness.   Consultants:  PCCM  Procedures:  Antimicrobials:  Anti-infectives (From admission, onward)    Start     Dose/Rate Route Frequency Ordered Stop   12/04/23 1145  amoxicillin -clavulanate (AUGMENTIN ) 875-125 MG per tablet 1 tablet        1 tablet Oral Every 12 hours 12/04/23 1054 12/07/23 2002   12/04/23 1145  doxycycline  (VIBRA -TABS) tablet 100 mg        100 mg Oral Every 12 hours 12/04/23 1054 12/07/23 2002   12/03/23 1445  Ampicillin -Sulbactam (UNASYN ) 3 g in sodium chloride  0.9 % 100 mL IVPB  Status:  Discontinued        3 g 200 mL/hr over 30 Minutes Intravenous Every 6 hours 12/03/23 1430 12/04/23 1053       Subjective: Patient was seen and examined at bedside.  Overnight events noted. He appears very deconditioned,  he has scaling of his skin in his both arms. His sodium  is improving. He remains on gentle IV hydration.  Patient's both IV lines are infiltrated. Patient has gotten the IV line in the right arm.   Objective: Vitals:   12/09/23 2046 12/10/23 0030 12/10/23 0620 12/10/23 0854  BP: 107/75 116/64 109/62   Pulse: (!) 116 (!) 124 94   Resp:  16 16 16   Temp: 99.3 F (37.4 C) 99.8 F (37.7 C) 97.9 F (36.6 C) 98.8 F (37.1 C)  TempSrc: Oral Oral Oral Oral  SpO2: 100% 98% 100%   Weight:   60 kg   Height:        Intake/Output Summary (Last 24  hours) at 12/10/2023 1153 Last data filed at 12/10/2023 0855 Gross per 24 hour  Intake 480 ml  Output 1300 ml  Net -820 ml   Filed Weights   12/08/23 0434 12/09/23 0345 12/10/23 0620  Weight: 65.3 kg 64.2 kg 60 kg    Examination:  General exam: Appears calm and comfortable, not in any acute distress. Respiratory system: CTA Bilaterally. Respiratory effort normal. RR 13 Cardiovascular system: S1 & S2 heard, RRR. No JVD, murmurs, rubs, gallops or clicks.  Gastrointestinal system: Abdomen is non distended, soft and non tender. Normal bowel sounds heard. Central nervous system: Alert and oriented x 3 . No focal neurological deficits. Extremities: Bilateral AKA.  No edema, no cyanosis. Skin: Scaling of skin in both arms. Psychiatry: Judgement and insight appear normal. Mood & affect appropriate.     Data Reviewed: I have personally reviewed following labs and imaging studies  CBC: Recent Labs  Lab 12/04/23 0447 12/05/23 0624 12/06/23 0551 12/07/23 0802 12/09/23 1645  WBC 7.9 7.4 7.6 7.4 8.3  HGB 10.1* 10.8* 10.5* 11.1* 11.2*  HCT 28.6* 31.9* 31.2* 33.2* 33.1*  MCV 79.0* 82.9 83.2 83.8 83.6  PLT 277 279 264 294 331   Basic Metabolic Panel: Recent Labs  Lab 12/04/23 0447 12/04/23 1103 12/04/23 1855 12/04/23 2238 12/05/23 0624 12/05/23 1107 12/06/23 0551 12/06/23 1135 12/06/23 2248 12/07/23 0802 12/07/23 1231 12/08/23 0502 12/08/23 1320 12/08/23 1941 12/08/23 2235 12/09/23 1645 12/09/23 1700 12/09/23 2227  NA 122* 121*   < > 124* 126*   < > 126* 126*   < > 129*   < > 129*   < > 128* 132* 129* 129* 130*  K 2.5* 3.6  --  3.6 3.4*  --   --  3.7  --   --   --   --   --   --   --  4.0  --   --   CL 86* 87*  --   --  92*  --   --  92*  --   --   --   --   --   --   --  95*  --   --   CO2 27 25  --   --  27  --   --  26  --   --   --   --   --   --   --  24  --   --   GLUCOSE 128* 133*  --   --  109*  --   --  110*  --   --   --   --   --   --   --  165*  --   --    BUN <5* <5*  --   --  <5*  --   --  <5*  --   --   --   --   --   --   --  7  --   --   CREATININE 0.59* 0.50*  --   --  0.41*  --   --  0.46*  --   --   --   --   --   --   --  0.60*  --   --   CALCIUM  7.4* 7.3*  --   --  8.0*  --   --  8.0*  --   --   --   --   --   --   --  8.2*  --   --   MG 1.7  1.8  --   --   --  1.8  --  1.7  --   --  1.9  --  1.8  --   --   --  1.7  --   --   PHOS 2.8  --   --   --  2.3*  --  2.6  --   --  3.8  --  4.8*  --   --   --  5.1*  --   --    < > = values in this interval not displayed.   GFR: Estimated Creatinine Clearance: 87.5 mL/min (A) (by C-G formula based on SCr of 0.6 mg/dL (L)). Liver Function Tests: Recent Labs  Lab 12/05/23 0624  AST 58*  ALT 29  ALKPHOS 44  BILITOT 0.4  PROT 5.9*  ALBUMIN  2.0*   No results for input(s): LIPASE, AMYLASE in the last 168 hours.  No results for input(s): AMMONIA in the last 168 hours. Coagulation Profile: No results for input(s): INR, PROTIME in the last 168 hours. Cardiac Enzymes: No results for input(s): CKTOTAL, CKMB, CKMBINDEX, TROPONINI in the last 168 hours. BNP (last 3 results) No results for input(s): PROBNP in the last 8760 hours. HbA1C: No results for input(s): HGBA1C in the last 72 hours. CBG: Recent Labs  Lab 12/04/23 0406 12/04/23 0739 12/04/23 1102 12/04/23 1534 12/04/23 1954  GLUCAP 135* 142* 142* 130* 147*   Lipid Profile: No results for input(s): CHOL, HDL, LDLCALC, TRIG, CHOLHDL, LDLDIRECT in the last 72 hours. Thyroid  Function Tests: No results for input(s): TSH, T4TOTAL, FREET4, T3FREE, THYROIDAB in the last 72 hours.  Anemia Panel: No results for input(s): VITAMINB12, FOLATE, FERRITIN, TIBC, IRON , RETICCTPCT in the last 72 hours. Sepsis Labs: Recent Labs  Lab 12/03/23 1313  LATICACIDVEN 1.4    Recent Results (from the past 240 hours)  MRSA Next Gen by PCR, Nasal     Status: None   Collection Time: 12/03/23   2:26 PM   Specimen: Nasal Mucosa; Nasal Swab  Result Value Ref Range Status   MRSA by PCR Next Gen NOT DETECTED NOT DETECTED Final    Comment: (NOTE) The GeneXpert MRSA Assay (FDA approved for NASAL specimens only), is one component of a comprehensive MRSA colonization surveillance program. It is not intended to diagnose MRSA infection nor to guide or monitor treatment for MRSA infections. Test performance is not FDA approved in patients less than 67 years old. Performed at [facility], [address], [place], [place] [address]     Radiology Studies: CT ABDOMEN PELVIS WO CONTRAST Result Date: 12/09/2023 CLINICAL DATA:  Right lower quadrant abdominal pain EXAM: CT ABDOMEN AND PELVIS WITHOUT CONTRAST TECHNIQUE: Multidetector CT imaging of the abdomen and pelvis was performed following the standard protocol without IV contrast. RADIATION DOSE REDUCTION: This exam was performed according to the departmental dose-optimization program which includes automated exposure control,  adjustment of the mA and/or kV according to patient size and/or use of iterative reconstruction technique. COMPARISON:  12/03/2023 FINDINGS: Lower chest: No acute abnormality. Hepatobiliary: Cholelithiasis without superimposed pericholecystic inflammatory change. Liver unremarkable on this noncontrast examination. No intra or extrahepatic biliary ductal dilation. Pancreas: Unremarkable Spleen: Unremarkable Adrenals/Urinary Tract: Adrenal glands are unremarkable. Kidneys are normal, without renal calculi, focal lesion, or hydronephrosis. Bladder is unremarkable. Stomach/Bowel: Moderate colonic stool burden. Stomach, small bowel, and large bowel are otherwise unremarkable. No evidence of obstruction or focal inflammation. Appendix is normal. No free intraperitoneal gas or fluid. Vascular/Lymphatic: Aortic atherosclerosis. No enlarged abdominal or pelvic lymph nodes. Reproductive: Prostate is unremarkable. Other: No abdominal  wall hernia or abnormality. No abdominopelvic ascites. Musculoskeletal: There is asymmetric infiltration within the soft tissues of the right flank, right lateral abdominal wall, and visualized right lower extremity particularly lateral to the greater trochanter which may reflect subcutaneous contusion in the setting of recent trauma. No loculated subcutaneous fluid collection identified. No acute bone abnormality. No lytic or blastic bone lesion. IMPRESSION: 1. No acute intra-abdominal pathology identified. No definite radiographic explanation for the patient's reported symptoms. 2. Cholelithiasis. 3. Moderate colonic stool burden. 4. Asymmetric infiltration within the soft tissues of the right flank, right lateral abdominal wall, and visualized right lower extremity particularly lateral to the greater trochanter which may reflect subcutaneous contusion in the setting of recent trauma. No loculated subcutaneous fluid collection identified. 5. Aortic atherosclerosis. Aortic Atherosclerosis (ICD10-I70.0). Electronically Signed   By: Dorethia Molt M.D.   On: 12/09/2023 20:10    Scheduled Meds:  Chlorhexidine  Gluconate Cloth  6 each Topical Daily   enoxaparin  (LOVENOX ) injection  40 mg Subcutaneous Q24H   feeding supplement  237 mL Oral TID BM   folic acid   1 mg Oral Daily   Gerhardt's butt cream   Topical BID   mirtazapine   15 mg Oral QHS   multivitamin with minerals  1 tablet Oral Daily   polyethylene glycol  17 g Oral Daily   thiamine   100 mg Oral Daily   Or   thiamine   100 mg Intravenous Daily   Continuous Infusions:   LOS: 7 days    Time spent: 35 mins    Darcel Dawley, MD [facility]   If 7PM-7AM, please contact night-coverage

## 2023-12-10 NOTE — Plan of Care (Signed)
  Problem: Coping: Goal: Level of anxiety will decrease Outcome: Progressing   Problem: Elimination: Goal: Will not experience complications related to bowel motility Outcome: Progressing   Problem: Pain Managment: Goal: General experience of comfort will improve and/or be controlled Outcome: Progressing

## 2023-12-11 DIAGNOSIS — E871 Hypo-osmolality and hyponatremia: Secondary | ICD-10-CM | POA: Diagnosis not present

## 2023-12-11 MED ORDER — METOPROLOL TARTRATE 12.5 MG HALF TABLET
12.5000 mg | ORAL_TABLET | Freq: Two times a day (BID) | ORAL | Status: DC
  Administered 2023-12-11 – 2023-12-12 (×3): 12.5 mg via ORAL
  Filled 2023-12-11 (×3): qty 1

## 2023-12-11 NOTE — Progress Notes (Signed)
 PT Cancellation Note  Patient Details Name: Jake Nguyen MRN: [id number] DOB: 12/29/1965   Cancelled Treatment:    Reason Eval/Treat Not Completed: Pain limiting ability to participate (pt reports 7/10 sacrum and right side, refused mobility this date, states may agree with premedication in future and more open to idea of snf. Educated for need to mobilize and benefits)   [name] [name] 12/11/2023, 9:21 AM Lenoard SQUIBB, PT Acute Rehabilitation Services Office: [phone]

## 2023-12-11 NOTE — Progress Notes (Signed)
 PROGRESS NOTE    Jake Nguyen  FMW:[id number] DOB: [birth year] DOA: 12/03/2023 PCP: Pcp, No   Brief Narrative:  This 58 years old homeless male, wheelchair-bound due to bilateral lower extremity amputation, brought in ED status post [place] bus [place].  He also reported coffee-ground emesis.  On ED arrival his clothes were soiled with dirt, urine and feces.  He has thick textured skin with flaking both in his arms.  He admits to drinking heavily.  Patient has history of recurrent hyponatremia due to chronic alcohol  use.  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  He was noncompliant with antibiotics given at discharge.  He was readmitted 8/3-8/13 after falling from wheelchair for sepsis and hypotension.  He did not want to go to nursing home and was provided the wound supplies in August to complete the course of antibiotics which he did not.  Patient was admitted in the ICU and has received hypertonic saline.  Sodium is now improving. TRH pickup 12/05/2023.   Assessment & Plan:   Principal Problem:   Acute hyponatremia Active Problems:   Atrial fibrillation with RVR (HCC)   Alcohol  abuse  Acute hyponatremia: Patient has history of recurrent hyponatremia in the setting of chronic alcohol  abuse.  Serum Osm 252, urine Osm 129, urine Na<10. Na on admission was 109 now 122 with hypertonic saline administration.  Likely due to beer potomania.  Neurologically intact, no evidence of OSD in the setting of rapid sodium correction. Stop D5 now that his Na>120 Serum sodium improving 121 >124 >126> 126 >129 >129 > 132 >130 >132. Discontinue q6 sodium check.   Atrial fibrillation with RVR : Short run of Afib RVR on 8/25 which was self-limited likely due to electrolyte disturbances. Continue to trend electrolytes and replete aggressively. Heart rate remains up, start metoprolol  12.5 mg every 12 hours.   Hypokalemia / Hypomagnesemia: Replaced.   Continue to monitor.   Lactic acidosis, resolved: Suspect likely due to hypovolemia in the setting of poor PO intake.  Less likely due to sepsis, however, last admission he was given a course of PO antibiotics for stump infection and unclear if he completed.  Stop IV Unasyn  and resume Augmentin  and doxycycline  to complete 7 day course .   Alcohol  abuse: Alcohol  level on admission 83.  - Continue CIWA protocol - Continue thiamine  and multivitamin   Pressure ulcers: Would care consult.    DVT prophylaxis: Lovenox  Code Status: Full code Family Communication: No family at bedside. Disposition Plan:    Status is: Inpatient Remains inpatient appropriate because: Severity of illness.   Consultants:  PCCM  Procedures:  Antimicrobials:  Anti-infectives (From admission, onward)    Start     Dose/Rate Route Frequency Ordered Stop   12/04/23 1145  amoxicillin -clavulanate (AUGMENTIN ) 875-125 MG per tablet 1 tablet        1 tablet Oral Every 12 hours 12/04/23 1054 12/07/23 2002   12/04/23 1145  doxycycline  (VIBRA -TABS) tablet 100 mg        100 mg Oral Every 12 hours 12/04/23 1054 12/07/23 2002   12/03/23 1445  Ampicillin -Sulbactam (UNASYN ) 3 g in sodium chloride  0.9 % 100 mL IVPB  Status:  Discontinued        3 g 200 mL/hr over 30 Minutes Intravenous Every 6 hours 12/03/23 1430 12/04/23 1053       Subjective: Patient was seen and examined at bedside.  Overnight events noted. He appears very deconditioned,  he has scaling of his skin in his both arms. His sodium has improved . He remains on gentle IV hydration.  Patient's both IV lines are infiltrated. Patient has gotten the IV line in the right arm.   Objective: Vitals:   12/11/23 0000 12/11/23 0400 12/11/23 0414 12/11/23 0828  BP:   106/66 112/79  Pulse: (!) 119 (!) 115 (!) 113 (!) 106  Resp:   19 19  Temp:   99 F (37.2 C) 98.5 F (36.9 C)  TempSrc:   Oral Oral  SpO2:   97% 100%  Weight:   65.2 kg   Height:         Intake/Output Summary (Last 24 hours) at 12/11/2023 1142 Last data filed at 12/11/2023 0829 Gross per 24 hour  Intake 600 ml  Output 1600 ml  Net -1000 ml   Filed Weights   12/09/23 0345 12/10/23 0620 12/11/23 0414  Weight: 64.2 kg 60 kg 65.2 kg    Examination:  General exam: Appears calm and comfortable, not in any acute distress. Respiratory system: CTA Bilaterally. Respiratory effort normal. RR 14 Cardiovascular system: S1 & S2 heard, RRR. No JVD, murmurs, rubs, gallops or clicks.  Gastrointestinal system: Abdomen is non distended, soft and non tender. Normal bowel sounds heard. Central nervous system: Alert and oriented x 3 . No focal neurological deficits. Extremities: Bilateral AKA.  No edema, no cyanosis. Skin: Scaling of skin in both arms. Psychiatry: Judgement and insight appear normal. Mood & affect appropriate.     Data Reviewed: I have personally reviewed following labs and imaging studies  CBC: Recent Labs  Lab 12/05/23 0624 12/06/23 0551 12/07/23 0802 12/09/23 1645  WBC 7.4 7.6 7.4 8.3  HGB 10.8* 10.5* 11.1* 11.2*  HCT 31.9* 31.2* 33.2* 33.1*  MCV 82.9 83.2 83.8 83.6  PLT 279 264 294 331   Basic Metabolic Panel: Recent Labs  Lab 12/04/23 2238 12/05/23 0624 12/05/23 1107 12/06/23 0551 12/06/23 1135 12/06/23 2248 12/07/23 0802 12/07/23 1231 12/08/23 0502 12/08/23 1320 12/09/23 1645 12/09/23 1700 12/09/23 2227 12/10/23 1140 12/10/23 1623  NA 124* 126*   < > 126* 126*   < > 129*   < > 129*   < > 129* 129* 130* 130* 132*  K 3.6 3.4*  --   --  3.7  --   --   --   --   --  4.0  --   --   --   --   CL  --  92*  --   --  92*  --   --   --   --   --  95*  --   --   --   --   CO2  --  27  --   --  26  --   --   --   --   --  24  --   --   --   --   GLUCOSE  --  109*  --   --  110*  --   --   --   --   --  165*  --   --   --   --   BUN  --  <5*  --   --  <5*  --   --   --   --   --  7  --   --   --   --   CREATININE  --  0.41*  --   --  0.46*  --    --   --   --   --  0.60*  --   --   --   --   CALCIUM   --  8.0*  --   --  8.0*  --   --   --   --   --  8.2*  --   --   --   --   MG  --  1.8  --  1.7  --   --  1.9  --  1.8  --  1.7  --   --   --   --   PHOS  --  2.3*  --  2.6  --   --  3.8  --  4.8*  --  5.1*  --   --   --   --    < > = values in this interval not displayed.   GFR: Estimated Creatinine Clearance: 95.1 mL/min (A) (by C-G formula based on SCr of 0.6 mg/dL (L)). Liver Function Tests: Recent Labs  Lab 12/05/23 0624  AST 58*  ALT 29  ALKPHOS 44  BILITOT 0.4  PROT 5.9*  ALBUMIN  2.0*   No results for input(s): LIPASE, AMYLASE in the last 168 hours.  No results for input(s): AMMONIA in the last 168 hours. Coagulation Profile: No results for input(s): INR, PROTIME in the last 168 hours. Cardiac Enzymes: No results for input(s): CKTOTAL, CKMB, CKMBINDEX, TROPONINI in the last 168 hours. BNP (last 3 results) No results for input(s): PROBNP in the last 8760 hours. HbA1C: No results for input(s): HGBA1C in the last 72 hours. CBG: Recent Labs  Lab 12/04/23 1534 12/04/23 1954  GLUCAP 130* 147*   Lipid Profile: No results for input(s): CHOL, HDL, LDLCALC, TRIG, CHOLHDL, LDLDIRECT in the last 72 hours. Thyroid  Function Tests: No results for input(s): TSH, T4TOTAL, FREET4, T3FREE, THYROIDAB in the last 72 hours.  Anemia Panel: No results for input(s): VITAMINB12, FOLATE, FERRITIN, TIBC, IRON , RETICCTPCT in the last 72 hours. Sepsis Labs: No results for input(s): PROCALCITON, LATICACIDVEN in the last 168 hours.   Recent Results (from the past 240 hours)  MRSA Next Gen by PCR, Nasal     Status: None   Collection Time: 12/03/23  2:26 PM   Specimen: Nasal Mucosa; Nasal Swab  Result Value Ref Range Status   MRSA by PCR Next Gen NOT DETECTED NOT DETECTED Final    Comment: (NOTE) The GeneXpert MRSA Assay (FDA approved for NASAL specimens only), is one  component of a comprehensive MRSA colonization surveillance program. It is not intended to diagnose MRSA infection nor to guide or monitor treatment for MRSA infections. Test performance is not FDA approved in patients less than 29 years old. Performed at [facility], [address], [place], [place] [address]     Radiology Studies: CT ABDOMEN PELVIS WO CONTRAST Result Date: 12/09/2023 CLINICAL DATA:  Right lower quadrant abdominal pain EXAM: CT ABDOMEN AND PELVIS WITHOUT CONTRAST TECHNIQUE: Multidetector CT imaging of the abdomen and pelvis was performed following the standard protocol without IV contrast. RADIATION DOSE REDUCTION: This exam was performed according to the departmental dose-optimization program which includes automated exposure control, adjustment of the mA and/or kV according to patient size and/or use of iterative reconstruction technique. COMPARISON:  12/03/2023 FINDINGS: Lower chest: No acute abnormality. Hepatobiliary: Cholelithiasis without superimposed pericholecystic inflammatory change. Liver unremarkable on this noncontrast examination. No intra or extrahepatic biliary ductal dilation. Pancreas: Unremarkable Spleen: Unremarkable Adrenals/Urinary Tract: Adrenal glands are unremarkable. Kidneys are normal, without renal calculi, focal lesion, or hydronephrosis. Bladder is unremarkable. Stomach/Bowel:  Moderate colonic stool burden. Stomach, small bowel, and large bowel are otherwise unremarkable. No evidence of obstruction or focal inflammation. Appendix is normal. No free intraperitoneal gas or fluid. Vascular/Lymphatic: Aortic atherosclerosis. No enlarged abdominal or pelvic lymph nodes. Reproductive: Prostate is unremarkable. Other: No abdominal wall hernia or abnormality. No abdominopelvic ascites. Musculoskeletal: There is asymmetric infiltration within the soft tissues of the right flank, right lateral abdominal wall, and visualized right lower extremity particularly  lateral to the greater trochanter which may reflect subcutaneous contusion in the setting of recent trauma. No loculated subcutaneous fluid collection identified. No acute bone abnormality. No lytic or blastic bone lesion. IMPRESSION: 1. No acute intra-abdominal pathology identified. No definite radiographic explanation for the patient's reported symptoms. 2. Cholelithiasis. 3. Moderate colonic stool burden. 4. Asymmetric infiltration within the soft tissues of the right flank, right lateral abdominal wall, and visualized right lower extremity particularly lateral to the greater trochanter which may reflect subcutaneous contusion in the setting of recent trauma. No loculated subcutaneous fluid collection identified. 5. Aortic atherosclerosis. Aortic Atherosclerosis (ICD10-I70.0). Electronically Signed   By: Dorethia Molt M.D.   On: 12/09/2023 20:10    Scheduled Meds:  enoxaparin  (LOVENOX ) injection  40 mg Subcutaneous Q24H   feeding supplement  237 mL Oral TID BM   folic acid   1 mg Oral Daily   Gerhardt's butt cream   Topical BID   metoprolol  tartrate  12.5 mg Oral BID   mirtazapine   15 mg Oral QHS   multivitamin with minerals  1 tablet Oral Daily   polyethylene glycol  17 g Oral Daily   thiamine   100 mg Oral Daily   Or   thiamine   100 mg Intravenous Daily   Continuous Infusions:   LOS: 8 days    Time spent: 35 mins    Darcel Dawley, MD [facility]   If 7PM-7AM, please contact night-coverage

## 2023-12-11 NOTE — Plan of Care (Signed)

## 2023-12-12 ENCOUNTER — Other Ambulatory Visit (HOSPITAL_COMMUNITY): Payer: Self-pay

## 2023-12-12 DIAGNOSIS — E871 Hypo-osmolality and hyponatremia: Secondary | ICD-10-CM | POA: Diagnosis not present

## 2023-12-12 LAB — BASIC METABOLIC PANEL WITH GFR
Anion gap: 7 (ref 5–15)
BUN: <5 mg/dL — ABNORMAL LOW (ref 6–20)
CO2: 27 mmol/L (ref 22–32)
Calcium: 7.9 mg/dL — ABNORMAL LOW (ref 8.9–10.3)
Chloride: 97 mmol/L — ABNORMAL LOW (ref 98–111)
Creatinine, Ser: 0.47 mg/dL — ABNORMAL LOW (ref 0.61–1.24)
GFR, Estimated: >60 mL/min (ref >60)
Glucose, Bld: 118 mg/dL — ABNORMAL HIGH (ref 70–99)
Potassium: 3.6 mmol/L (ref 3.5–5.1)
Sodium: 131 mmol/L — ABNORMAL LOW (ref 135–145)

## 2023-12-12 LAB — CBC
HCT: 29.6 % — ABNORMAL LOW (ref 39.0–52.0)
Hemoglobin: 9.9 g/dL — ABNORMAL LOW (ref 13.0–17.0)
MCH: 27.5 pg (ref 26.0–34.0)
MCHC: 33.4 g/dL (ref 30.0–36.0)
MCV: 82.2 fL (ref 80.0–100.0)
Platelets: 440 K/uL — ABNORMAL HIGH (ref 150–400)
RBC: 3.6 MIL/uL — ABNORMAL LOW (ref 4.22–5.81)
RDW: 14.2 % (ref 11.5–15.5)
WBC: 7.7 K/uL (ref 4.0–10.5)
nRBC: 0 % (ref 0.0–0.2)

## 2023-12-12 LAB — MAGNESIUM: Magnesium: 1.7 mg/dL (ref 1.7–2.4)

## 2023-12-12 LAB — PHOSPHORUS: Phosphorus: 5.1 mg/dL — ABNORMAL HIGH (ref 2.5–4.6)

## 2023-12-12 MED ORDER — THIAMINE HCL 100 MG PO TABS
100.0000 mg | ORAL_TABLET | Freq: Every day | ORAL | 0 refills | Status: AC
  Filled 2023-12-12: qty 30, 30d supply, fill #0

## 2023-12-12 MED ORDER — FOLIC ACID 1 MG PO TABS
1.0000 mg | ORAL_TABLET | Freq: Every day | ORAL | 0 refills | Status: AC
  Filled 2023-12-12: qty 30, 30d supply, fill #0

## 2023-12-12 MED ORDER — MIRTAZAPINE 15 MG PO TABS
15.0000 mg | ORAL_TABLET | Freq: Every day | ORAL | 0 refills | Status: AC
  Filled 2023-12-12: qty 30, 30d supply, fill #0

## 2023-12-12 MED ORDER — POLYETHYLENE GLYCOL 3350 17 GM/SCOOP PO POWD
17.0000 g | Freq: Every day | ORAL | 0 refills | Status: AC
Start: 2023-12-13 — End: ?
  Filled 2023-12-12: qty 238, 14d supply, fill #0

## 2023-12-12 MED ORDER — METOPROLOL TARTRATE 25 MG PO TABS
12.5000 mg | ORAL_TABLET | Freq: Two times a day (BID) | ORAL | 0 refills | Status: DC
  Filled 2023-12-12: qty 30, 30d supply, fill #0

## 2023-12-12 MED ORDER — DOCUSATE SODIUM 100 MG PO CAPS
100.0000 mg | ORAL_CAPSULE | Freq: Two times a day (BID) | ORAL | 0 refills | Status: AC | PRN
Start: 2023-12-12 — End: ?
  Filled 2023-12-12: qty 10, 5d supply, fill #0

## 2023-12-12 MED ORDER — TRAZODONE HCL 50 MG PO TABS
50.0000 mg | ORAL_TABLET | Freq: Every evening | ORAL | 0 refills | Status: DC | PRN
  Filled 2023-12-12: qty 30, 30d supply, fill #0

## 2023-12-12 MED ORDER — HYDROXYZINE HCL 25 MG PO TABS
25.0000 mg | ORAL_TABLET | Freq: Three times a day (TID) | ORAL | 0 refills | Status: AC | PRN
  Filled 2023-12-12: qty 21, 7d supply, fill #0

## 2023-12-12 MED ORDER — OXYCODONE HCL 5 MG PO TABS
5.0000 mg | ORAL_TABLET | Freq: Four times a day (QID) | ORAL | 0 refills | Status: AC | PRN
Start: 2023-12-12 — End: 2023-12-15
  Filled 2023-12-12: qty 6, 2d supply, fill #0

## 2023-12-12 NOTE — Plan of Care (Signed)
 Patient has completed all care plan goals without outcome adequate for discharge.

## 2023-12-12 NOTE — TOC Transition Note (Signed)
 Transition of Care [facility]) - Discharge Note   Patient Details  Name: Jake Nguyen MRN: [id number] Date of Birth: 06-Oct-1965  Transition of Care [facility]) CM/SW Contact:  Sudie Erminio Deems, RN Phone Number: 12/12/2023, 11:09 AM   Clinical Narrative: Case Manager spoke with patient and he is still declining SNF. Patient wants to return to [facility] via Taxi. Patient is without PCP- CMA to schedule an appointment and place the information of the AVS. Case Manager discussed that his wounds need to be taken care of once discharged. Patient does not have a telephone # or address to schedule home health services. Unable to schedule an appointment at the wound care center due to the office inability to contact the patient. Case Manager ordered a cushion for the wheelchair.  Patient will utilize a taxi voucher for home. Case Manager stressed to patient that SNF is the best option for him at this time and he is still adamant to return to [facility]. No further needs at this time.       Barriers to Discharge: No Barriers Identified   Patient Goals and CMS Choice Patient states their goals for this hospitalization and ongoing recovery are:: patient declines SNF and decides to go back to the street. Discussed an appointment with the wound care center and he does not have a cell phone for office to contact him.   Choice offered to / list presented to : Patient      Discharge Placement                       Discharge Plan and Services Additional resources added to the After Visit Summary for   In-house Referral: Clinical Social Work Discharge Planning Services: CM Consult Post Acute Care Choice: NA            DME Agency: NA                  Social Drivers of Health (SDOH) Interventions SDOH Screenings   Food Insecurity: Food Insecurity Present (12/05/2023)  Housing: High Risk (12/05/2023)  Transportation Needs: Unmet Transportation Needs (12/05/2023)  Utilities: Patient Declined  (12/05/2023)  Alcohol  Screen: Medium Risk (11/25/2020)  Depression (PHQ2-9): Low Risk  (11/06/2019)  Social Connections: Socially Isolated (07/20/2023)  Tobacco Use: High Risk (12/03/2023)     Readmission Risk Interventions    11/15/2023   11:58 AM  Readmission Risk Prevention Plan  Transportation Screening Complete  Medication Review (RN Care Manager) Complete  PCP or Specialist appointment within 3-5 days of discharge Complete  HRI or Home Care Consult Complete  SW Recovery Care/Counseling Consult Complete  Palliative Care Screening Not Applicable  Skilled Nursing Facility Complete

## 2023-12-12 NOTE — Discharge Summary (Signed)
 Physician Discharge Summary  Jake Nguyen FMW:[id number] DOB: [birth year] DOA: 12/03/2023  PCP: Pcp, No  Admit date: 12/03/2023  Discharge date: 12/12/2023  Admitted From: Home  Disposition: Home / Self CARE  Recommendations for Outpatient Follow-up:  Follow up with PCP in 1-2 weeks. Please obtain BMP/CBC in one week. Advised to take Remeron  and trazodone  as prescribed. Patient has capacity to make decision. Patient wants to be discharged, he declined to be discharged to skilled nursing facility.  Home Health: None Equipment/Devices:None  Discharge Condition: Stable CODE STATUS:Full code Diet recommendation: Heart Healthy  Brief [facility] Course: This 58 years old homeless male, wheelchair-bound due to bilateral lower extremity amputation, brought in ED status post fall from his wheelchair at the bus stop. He also reported coffee-ground emesis.  On ED arrival his clothes were soiled with dirt, urine and feces.  He has thick textured skin with flaking both in his arms. He admits to drinking heavily.  Patient has history of recurrent hyponatremia due to chronic alcohol  use.  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  He was noncompliant with antibiotics given at discharge.  He was readmitted 8/3-8/13 after falling from wheelchair for sepsis and hypotension.  He did not want to go to nursing home and was provided the wound supplies in August to complete the course of antibiotics which he did not.  Patient was admitted in the ICU and has received hypertonic saline.  Sodium has improved. TRH pickup 12/05/2023.  Patient was continued on IV hydration.  Serum sodium has improved and back to normal.  Lactic acidosis resolved.  Patient was offered nursing home placement.  Patient declined.  Psych evaluation completed,  Patient does have capacity to make decisions.  Patient wants to be discharged with self-care.  He refused to go to nursing home.   Discharge Diagnoses:   Principal Problem:   Acute hyponatremia Active Problems:   Atrial fibrillation with RVR (HCC)   Alcohol  abuse  Acute hyponatremia: Patient has history of recurrent hyponatremia in the setting of chronic alcohol  abuse.  Serum Osm 252, urine Osm 129, urine Na<10. Na on admission was 109 now 122 with hypertonic saline administration.  Likely due to beer potomania.  Neurologically intact, no evidence of OSD in the setting of rapid sodium correction. Stop D5 now that his Na>120 Serum sodium improving 121 >124 >126> 126 >129 >129 > 132 >130 >132. Hyponatremia  > Resolved.   Atrial fibrillation with RVR : Short run of Afib RVR on 8/25 which was self-limited likely due to electrolyte disturbances. Continue to trend electrolytes and replete aggressively. Heart rate remains up, started metoprolol  12.5 mg every 12 hours.     Hypokalemia / Hypomagnesemia: Replaced.  Continue to monitor.   Lactic acidosis, resolved: Suspect likely due to hypovolemia in the setting of poor PO intake.  Less likely due to sepsis, however, last admission he was given a course of PO antibiotics for stump infection and unclear if he completed.  Stop IV Unasyn  and resume Augmentin  and doxycycline  to complete 7 day course . He completed course of antibiotics.   Alcohol  abuse: Alcohol  level on admission 83.  - Continue CIWA protocol - Continue thiamine  and multivitamin    Pressure ulcers: Would care consult.  Discharge Instructions  Discharge Instructions     Call MD for:  persistant dizziness or light-headedness   Complete by: As directed    Call MD for:  persistant nausea and vomiting   Complete by: As  directed    Call MD for:  redness, tenderness, or signs of infection (pain, swelling, redness, odor or green/yellow discharge around incision site)   Complete by: As directed    Diet - low sodium heart healthy   Complete by: As directed    Diet general   Complete by: As directed    Discharge  instructions   Complete by: As directed    Advised to follow-up with primary care physician in 1 week. Advised to take Remeron  and trazodone  as prescribed. Patient has capacity to make decision.  Patient wants to be discharged, he declined to be discharged to a skilled nursing facility.   Discharge wound care:   Complete by: As directed    Follow-up wound care clinic.   Increase activity slowly   Complete by: As directed       Allergies as of 12/12/2023       Reactions   Pork-derived Products Other (See Comments)   Pt states he doesn't eat pork        Medication List     STOP taking these medications    acetaminophen  325 MG tablet Commonly known as: TYLENOL    amoxicillin -clavulanate 875-125 MG tablet Commonly known as: AUGMENTIN    atorvastatin  40 MG tablet Commonly known as: LIPITOR   [employer]/Antioxidants Tabs   doxycycline  100 MG tablet Commonly known as: ADOXA   midodrine  2.5 MG tablet Commonly known as: PROAMATINE    traMADol  50 MG tablet Commonly known as: ULTRAM        TAKE these medications    docusate sodium  100 MG capsule Commonly known as: COLACE Take 1 capsule (100 mg total) by mouth 2 (two) times daily as needed for mild constipation.   folic acid  1 MG tablet Commonly known as: FOLVITE  Take 1 tablet (1 mg total) by mouth daily. Start taking on: December 13, 2023   hydrOXYzine  25 MG tablet Commonly known as: ATARAX  Take 1 tablet (25 mg total) by mouth every 8 (eight) hours as needed for up to 7 days for itching.   metoprolol  tartrate 25 MG tablet Commonly known as: LOPRESSOR  Take 0.5 tablets (12.5 mg total) by mouth 2 (two) times daily.   mirtazapine  15 MG tablet Commonly known as: REMERON  Take 1 tablet (15 mg total) by mouth at bedtime.   oxyCODONE  5 MG immediate release tablet Commonly known as: Oxy IR/ROXICODONE  Take 1 tablet (5 mg total) by mouth every 6 (six) hours as needed for up to 3 days for breakthrough pain.    polyethylene glycol powder 17 GM/SCOOP powder Commonly known as: GLYCOLAX /MIRALAX  Dissolve 1 capful (17 g) into liquid as directed and take by mouth daily Start taking on: December 13, 2023   thiamine  100 MG tablet Commonly known as: VITAMIN B1 Take 1 tablet (100 mg total) by mouth daily. Start taking on: December 13, 2023   traZODone  50 MG tablet Commonly known as: DESYREL  Take 1 tablet (50 mg total) by mouth at bedtime as needed for sleep (for difficulty initiating sleep in patients with depressive symptoms).               Discharge Care Instructions  (From admission, onward)           Start     Ordered   12/12/23 0000  Discharge wound care:       Comments: Follow-up wound care clinic.   12/12/23 1033            Follow-up Information     Paseda, Folashade R, FNP  Follow up.   Specialty: Nurse Practitioner Why: TIME : 2:00 PM   PLEASE ARRIVE AT 1:30 PM DATE : OCTOBER 10 ,2025  PLEASE BRING ALL CURRENT MEDICATION, ID and INS CARD Contact information: [address] [place], [place] [address] [phone]                Allergies  Allergen Reactions   Pork-Derived Products Other (See Comments)    Pt states he doesn't eat pork    Consultations: PCCM   Procedures/Studies: CT ABDOMEN PELVIS WO CONTRAST Result Date: 12/09/2023 CLINICAL DATA:  Right lower quadrant abdominal pain EXAM: CT ABDOMEN AND PELVIS WITHOUT CONTRAST TECHNIQUE: Multidetector CT imaging of the abdomen and pelvis was performed following the standard protocol without IV contrast. RADIATION DOSE REDUCTION: This exam was performed according to the departmental dose-optimization program which includes automated exposure control, adjustment of the mA and/or kV according to patient size and/or use of iterative reconstruction technique. COMPARISON:  12/03/2023 FINDINGS: Lower chest: No acute abnormality. Hepatobiliary: Cholelithiasis without superimposed pericholecystic  inflammatory change. Liver unremarkable on this noncontrast examination. No intra or extrahepatic biliary ductal dilation. Pancreas: Unremarkable Spleen: Unremarkable Adrenals/Urinary Tract: Adrenal glands are unremarkable. Kidneys are normal, without renal calculi, focal lesion, or hydronephrosis. Bladder is unremarkable. Stomach/Bowel: Moderate colonic stool burden. Stomach, small bowel, and large bowel are otherwise unremarkable. No evidence of obstruction or focal inflammation. Appendix is normal. No free intraperitoneal gas or fluid. Vascular/Lymphatic: Aortic atherosclerosis. No enlarged abdominal or pelvic lymph nodes. Reproductive: Prostate is unremarkable. Other: No abdominal wall hernia or abnormality. No abdominopelvic ascites. Musculoskeletal: There is asymmetric infiltration within the soft tissues of the right flank, right lateral abdominal wall, and visualized right lower extremity particularly lateral to the greater trochanter which may reflect subcutaneous contusion in the setting of recent trauma. No loculated subcutaneous fluid collection identified. No acute bone abnormality. No lytic or blastic bone lesion. IMPRESSION: 1. No acute intra-abdominal pathology identified. No definite radiographic explanation for the patient's reported symptoms. 2. Cholelithiasis. 3. Moderate colonic stool burden. 4. Asymmetric infiltration within the soft tissues of the right flank, right lateral abdominal wall, and visualized right lower extremity particularly lateral to the greater trochanter which may reflect subcutaneous contusion in the setting of recent trauma. No loculated subcutaneous fluid collection identified. 5. Aortic atherosclerosis. Aortic Atherosclerosis (ICD10-I70.0). Electronically Signed   By: Dorethia Molt M.D.   On: 12/09/2023 20:10   CT Head Wo Contrast Result Date: 12/03/2023 CLINICAL DATA:  Patient fell from wheelchair. Altered mental status and syncope. EXAM: CT HEAD WITHOUT CONTRAST CT  CERVICAL SPINE WITHOUT CONTRAST TECHNIQUE: Multidetector CT imaging of the head and cervical spine was performed following the standard protocol without intravenous contrast. Multiplanar CT image reconstructions of the cervical spine were also generated. RADIATION DOSE REDUCTION: This exam was performed according to the departmental dose-optimization program which includes automated exposure control, adjustment of the mA and/or kV according to patient size and/or use of iterative reconstruction technique. COMPARISON:  Head CT 11/12/2023.  CT cervical spine 09/17/2023. FINDINGS: CT HEAD FINDINGS Brain: There is no evidence for acute hemorrhage, hydrocephalus, mass lesion, or abnormal extra-axial fluid collection. No definite CT evidence for acute infarction. Diffuse loss of parenchymal volume is consistent with atrophy. Vascular: No hyperdense vessel or unexpected calcification. Skull: No evidence for fracture. No worrisome lytic or sclerotic lesion. Sinuses/Orbits: The visualized paranasal sinuses and mastoid air cells are clear. Visualized portions of the globes and intraorbital fat are unremarkable. Other: None. CT CERVICAL SPINE FINDINGS Alignment:  Reversal of normal cervical lordosis. No evidence for traumatic subluxation. Skull base and vertebrae: No acute fracture. No primary bone lesion or focal pathologic process. Soft tissues and spinal canal: No prevertebral fluid or swelling. No visible canal hematoma. Disc levels:  Preserved throughout. Upper chest: Paraseptal emphysema noted in the lung apices. Other: None. IMPRESSION: 1. No acute intracranial abnormality. 2. No evidence for cervical spine fracture or traumatic subluxation. 3. Reversal of normal cervical lordosis. This can be related to patient positioning, muscle spasm or soft tissue injury. 4.  Emphysema (ICD10-J43.9). Electronically Signed   By: Camellia Candle M.D.   On: 12/03/2023 13:24   CT Cervical Spine Wo Contrast Result Date:  12/03/2023 CLINICAL DATA:  Patient fell from wheelchair. Altered mental status and syncope. EXAM: CT HEAD WITHOUT CONTRAST CT CERVICAL SPINE WITHOUT CONTRAST TECHNIQUE: Multidetector CT imaging of the head and cervical spine was performed following the standard protocol without intravenous contrast. Multiplanar CT image reconstructions of the cervical spine were also generated. RADIATION DOSE REDUCTION: This exam was performed according to the departmental dose-optimization program which includes automated exposure control, adjustment of the mA and/or kV according to patient size and/or use of iterative reconstruction technique. COMPARISON:  Head CT 11/12/2023.  CT cervical spine 09/17/2023. FINDINGS: CT HEAD FINDINGS Brain: There is no evidence for acute hemorrhage, hydrocephalus, mass lesion, or abnormal extra-axial fluid collection. No definite CT evidence for acute infarction. Diffuse loss of parenchymal volume is consistent with atrophy. Vascular: No hyperdense vessel or unexpected calcification. Skull: No evidence for fracture. No worrisome lytic or sclerotic lesion. Sinuses/Orbits: The visualized paranasal sinuses and mastoid air cells are clear. Visualized portions of the globes and intraorbital fat are unremarkable. Other: None. CT CERVICAL SPINE FINDINGS Alignment: Reversal of normal cervical lordosis. No evidence for traumatic subluxation. Skull base and vertebrae: No acute fracture. No primary bone lesion or focal pathologic process. Soft tissues and spinal canal: No prevertebral fluid or swelling. No visible canal hematoma. Disc levels:  Preserved throughout. Upper chest: Paraseptal emphysema noted in the lung apices. Other: None. IMPRESSION: 1. No acute intracranial abnormality. 2. No evidence for cervical spine fracture or traumatic subluxation. 3. Reversal of normal cervical lordosis. This can be related to patient positioning, muscle spasm or soft tissue injury. 4.  Emphysema (ICD10-J43.9).  Electronically Signed   By: Camellia Candle M.D.   On: 12/03/2023 13:24   CT ABDOMEN PELVIS W CONTRAST Result Date: 12/03/2023 EXAM: CT ABDOMEN AND PELVIS WITH CONTRAST 12/03/2023 12:45:37 PM TECHNIQUE: CT of the abdomen and pelvis was performed with the administration of intravenous contrast (75mL iohexol  (OMNIPAQUE ) 350 MG/ML injection). Multiplanar reformatted images are provided for review. Automated exposure control, iterative reconstruction, and/or weight-based adjustment of the mA/kV was utilized to reduce the radiation dose to as low as reasonably achievable. COMPARISON: None available. CLINICAL HISTORY: Abdominal pain, acute, nonlocalized. Pt bib ems from bus stop c/o fall from wheelchair. PTAR alerted EMS for AMS and syncopal episode. FINDINGS: LOWER CHEST: Circumferential thickening of the visualized distal esophagus. Small hiatal hernia. LIVER: Normal size and contour. GALLBLADDER AND BILE DUCTS: Several small calcified gallstones layer at the dependent aspect of the nondilated gallbladder, without adjacent inflammatory change. SPLEEN: Normal size. No focal lesion. PANCREAS: No mass. No ductal dilatation. ADRENAL GLANDS: Normal appearance. No mass. KIDNEYS, URETERS AND BLADDER: No stones in the kidneys or ureters. No hydronephrosis. No perinephric or periureteral stranding. Urinary bladder distended. GI AND BOWEL: Stomach demonstrates no acute abnormality. There is no bowel obstruction. No bowel wall thickening.  PERITONEUM AND RETROPERITONEUM: No ascites. No free air. VASCULATURE: Scattered calcified aortic plaque without aneurysm. LYMPH NODES: Mildly enlarged bilateral inguinal lymph nodes right greater than left, nonspecific. REPRODUCTIVE ORGANS: No significant abnormality. BONES AND SOFT TISSUES: Mild degenerative disc disease at L4-5. No acute osseous abnormality. No focal soft tissue abnormality. IMPRESSION: 1. No acute findings in the abdomen or pelvis. 2. Circumferential thickening of the  visualized distal esophagus and small hiatal hernia. 3. Cholelithiasis Electronically signed by: Katheleen Faes MD 12/03/2023 01:07 PM EDT RP Workstation: [id number]   DG Chest Portable 1 View Result Date: 12/03/2023 EXAM: 1 VIEW XRAY OF THE CHEST 12/03/2023 10:05:00 AM COMPARISON: 11/12/2023 CLINICAL HISTORY: AMS. Ordered for AMS; Per triage notes: from bus stop c/o fall from wheelchair. PTAR alerted EMS for AMS and syncopal episode. Coffee ground emesis that started. Pt d/c 8/13 for skin infection. FINDINGS: LUNGS AND PLEURA: Improved aeration in the lung bases. No focal pulmonary opacity. No pulmonary edema. No pleural effusion. No pneumothorax. HEART AND MEDIASTINUM: No acute abnormality of the cardiac and mediastinal silhouettes. BONES AND SOFT TISSUES: No acute osseous abnormality. IMPRESSION: 1. Improved aeration in the lung bases. Electronically signed by: Katheleen Faes MD 12/03/2023 10:38 AM EDT RP Workstation: [id number]   CT FEMUR RIGHT W CONTRAST Result Date: 11/13/2023 CLINICAL DATA:  Osteomyelitis suspected, femur, xray done EXAM: CT OF THE LOWER RIGHT EXTREMITY WITH CONTRAST TECHNIQUE: Multidetector CT imaging of the lower right extremity was performed according to the standard protocol following intravenous contrast administration. RADIATION DOSE REDUCTION: This exam was performed according to the departmental dose-optimization program which includes automated exposure control, adjustment of the mA and/or kV according to patient size and/or use of iterative reconstruction technique. CONTRAST:  75mL OMNIPAQUE  IOHEXOL  350 MG/ML SOLN COMPARISON:  Radiographs 11/12/2023.  MRI 10/30/2023. FINDINGS: Bones/Joint/Cartilage Stable postsurgical changes from below the knee amputation. There is stable mild periosteal reaction along the amputation margin which appears unchanged. No cortical destruction identified to suggest osteomyelitis. No evidence of acute fracture, dislocation or femoral head  osteonecrosis. The bones are mildly demineralized. There is a small knee joint effusion. Ligaments Suboptimally assessed by CT. Muscles and Tendons Mild generalized muscular atrophy. No acute or focal abnormality identified. Soft tissues Mildly prominent inguinal lymph nodes bilaterally, likely reactive. Mild subcutaneous edema throughout the right thigh without new or enlarging focal fluid collection. A chronic well-circumscribed cystic lesion along the medial aspect of the distal right thigh is unchanged, measuring 2.3 cm on image 329/4. Mild subcutaneous inflammatory changes along the tibial stump without focal fluid collection, foreign body or soft tissue emphysema. IMPRESSION: 1. Stable postsurgical changes from below the knee amputation. No CT evidence of osteomyelitis. 2. Mild subcutaneous edema throughout the right thigh and along the tibial stump without new or enlarging focal fluid collection, foreign body or soft tissue emphysema. 3. Stable chronic well-circumscribed cystic lesion along the medial aspect of the distal right thigh, likely a sebaceous or ganglion cyst. 4. Mildly prominent inguinal lymph nodes bilaterally, likely reactive. Electronically Signed   By: Elsie Perone M.D.   On: 11/13/2023 11:29   ECHOCARDIOGRAM COMPLETE Result Date: 11/13/2023    ECHOCARDIOGRAM REPORT   Patient Name:   Jake Nguyen Date of Exam: 11/13/2023 Medical Rec #:  [id number]        Height:       70.0 in Accession #:    [id number]       Weight:       153.2 lb Date of Birth:  1966/01/27  BSA:          1.864 m Patient Age:    58 years         BP:           117/64 mmHg Patient Gender: M                HR:           90 bpm. Exam Location:  Inpatient Procedure: 2D Echo, Color Doppler and Cardiac Doppler (Both Spectral and Color            Flow Doppler were utilized during procedure). Indications:    Shock  History:        Patient has prior history of Echocardiogram examinations, most                 recent  03/20/2021. Chronic alcohol  abuse.  Sonographer:    Benard Stallion Referring Phys: JESSICA MARSHALL IMPRESSIONS  1. Left ventricular ejection fraction, by estimation, is 65 to 70%. The left ventricle has normal function. Left ventricular endocardial border not optimally defined to evaluate regional wall motion. Left ventricular diastolic function could not be evaluated.  2. Right ventricular systolic function was not well visualized. The right ventricular size is not well visualized.  3. The mitral valve is grossly normal. No evidence of mitral valve regurgitation.  4. The aortic valve is grossly normal. Aortic valve regurgitation is not visualized. Conclusion(s)/Recommendation(s): Extremely limited study given that patient declined the majority of his exam. LV function appears roughly normal. FINDINGS  Left Ventricle: Left ventricular ejection fraction, by estimation, is 65 to 70%. The left ventricle has normal function. Left ventricular endocardial border not optimally defined to evaluate regional wall motion. The left ventricular internal cavity size was normal in size. There is no left ventricular hypertrophy. Left ventricular diastolic function could not be evaluated. Right Ventricle: The right ventricular size is not well visualized. Right vetricular wall thickness was not well visualized. Right ventricular systolic function was not well visualized. Left Atrium: Left atrial size was normal in size. Right Atrium: Right atrial size was not well visualized. Pericardium: The pericardium was not well visualized. Mitral Valve: The mitral valve is grossly normal. No evidence of mitral valve regurgitation. Tricuspid Valve: The tricuspid valve is not well visualized. Tricuspid valve regurgitation is trivial. No evidence of tricuspid stenosis. Aortic Valve: The aortic valve is grossly normal. Aortic valve regurgitation is not visualized. Pulmonic Valve: The pulmonic valve was not well visualized. No evidence of  pulmonic stenosis. Aorta: The aortic root is normal in size and structure. Venous: The inferior vena cava was not well visualized. IAS/Shunts: The interatrial septum was not assessed.  LEFT VENTRICLE PLAX 2D LVIDd:         4.70 cm LVIDs:         2.50 cm LV PW:         0.80 cm LV IVS:        0.80 cm  LEFT ATRIUM         Index LA diam:    3.40 cm 1.82 cm/m Morene Brownie Electronically signed by Morene Brownie Signature Date/Time: 11/13/2023/9:02:12 AM    Final    CT Head Wo Contrast Result Date: 11/12/2023 EXAM: CT HEAD WITHOUT 11/12/2023 10:25:54 PM TECHNIQUE: CT of the head was performed without the administration of intravenous contrast. Automated exposure control, iterative reconstruction, and/or weight based adjustment of the mA/kV was utilized to reduce the radiation dose to as low as reasonably achievable. COMPARISON: 10/27/2023  CLINICAL HISTORY: Head trauma, abnormal mental status (Age 68-64y). FINDINGS: BRAIN AND VENTRICLES: No acute intracranial hemorrhage. No mass effect or midline shift. No extra-axial fluid collection. Gray-white differentiation is maintained. No hydrocephalus. ORBITS: No acute abnormality. SINUSES AND MASTOIDS: No acute abnormality. SOFT TISSUES AND SKULL: No acute skull fracture. No acute soft tissue abnormality. IMPRESSION: 1. No acute intracranial abnormality. Electronically signed by: Franky Stanford MD 11/12/2023 10:32 PM EDT RP Workstation: [id number]   DG Tibia/Fibula Right Result Date: 11/12/2023 EXAM: 3 VIEW(S) XRAY OF THE RIGHT TIBIA AND FIBULA 11/12/2023 08:27:00 PM COMPARISON: Same day knee radiographs and MRI. CLINICAL HISTORY: Infection. FINDINGS: BONES AND JOINTS: No acute fracture or dislocation. No radiographic evidence of osteomyelitis; however, radiograph is less sensitive than MRI. SOFT TISSUES: Soft tissue swelling about the amputation stump. Right below the knee amputation. No knee joint effusion. IMPRESSION: 1. Right below the knee amputation with soft tissue  swelling about the amputation stump. 2. No radiographic evidence of osteomyelitis. Electronically signed by: Norman Gatlin MD 11/12/2023 08:49 PM EDT RP Workstation: [id number]   DG Chest Port 1 View Result Date: 11/12/2023 CLINICAL DATA:  Concern for sepsis. EXAM: PORTABLE CHEST 1 VIEW COMPARISON:  10/27/2023. FINDINGS: The heart size and mediastinal contours are within normal limits. Mild streaky bibasilar opacities are favored to reflect atelectasis, although, infiltrate cannot be excluded. No pleural effusion or pneumothorax. No acute osseous abnormality. IMPRESSION: Mild streaky bibasilar opacities are favored to reflect atelectasis, although, infiltrate cannot be excluded. Electronically Signed   By: Harrietta Sherry M.D.   On: 11/12/2023 20:05   DG Knee 2 Views Right Result Date: 11/12/2023 CLINICAL DATA:  Right knee wound. History of right below-the-knee amputation. EXAM: RIGHT KNEE - 1-2 VIEW COMPARISON:  10/27/2023 FINDINGS: The distal tibial stump/tibial and fibular transsection margins are not included within the field of view of this exam. No acute fracture or dislocation. No sizeable joint effusion. Generalized subcutaneous edema of the visualized right lower extremity. IMPRESSION: 1. No acute osseous abnormality identified. The distal tibial stump/tibial and fibular transsection margins are not included within the field of view of this exam. 2.  Generalized subcutaneous edema of the right lower extremity. Electronically Signed   By: Harrietta Sherry M.D.   On: 11/12/2023 20:02     Subjective: Patient was seen and examined at bedside.  Overnight events noted. Patient refused to go to nursing home, states he wants to go home.  Discharge Exam: Vitals:   12/12/23 1100 12/12/23 1224  BP:  110/75  Pulse:  (!) 104  Resp:  20  Temp:  97.8 F (36.6 C)  SpO2: 98%    Vitals:   12/12/23 0556 12/12/23 0804 12/12/23 1100 12/12/23 1224  BP:  (!) 116/47  110/75  Pulse: (!) 106 93  (!) 104   Resp: 18 18  20   Temp: 99 F (37.2 C) 99 F (37.2 C)  97.8 F (36.6 C)  TempSrc: Oral Oral  Oral  SpO2:   98%   Weight:      Height:        General: Pt is alert, awake, not in acute distress Cardiovascular: RRR, S1/S2 +, no rubs, no gallops Respiratory: CTA bilaterally, no wheezing, no rhonchi Abdominal: Soft, NT, ND, bowel sounds + Extremities: Bilateral BKA    The results of significant diagnostics from this hospitalization (including imaging, microbiology, ancillary and laboratory) are listed below for reference.     Microbiology: Recent Results (from the past 240 hours)  MRSA Next Gen by PCR, Nasal  Status: None   Collection Time: 12/03/23  2:26 PM   Specimen: Nasal Mucosa; Nasal Swab  Result Value Ref Range Status   MRSA by PCR Next Gen NOT DETECTED NOT DETECTED Final    Comment: (NOTE) The GeneXpert MRSA Assay (FDA approved for NASAL specimens only), is one component of a comprehensive MRSA colonization surveillance program. It is not intended to diagnose MRSA infection nor to guide or monitor treatment for MRSA infections. Test performance is not FDA approved in patients less than 24 years old. Performed at [facility], [address] [place], [place] [address]      Labs: BNP (last 3 results) Recent Labs    08/21/23 0508 08/22/23 0426 08/23/23 0442  BNP 67.0 32.8 17.4   Basic Metabolic Panel: Recent Labs  Lab 12/06/23 0551 12/06/23 1135 12/06/23 2248 12/07/23 0802 12/07/23 1231 12/08/23 0502 12/08/23 1320 12/09/23 1645 12/09/23 1700 12/09/23 2227 12/10/23 1140 12/10/23 1623 12/12/23 0609  NA 126* 126*   < > 129*   < > 129*   < > 129* 129* 130* 130* 132* 131*  K  --  3.7  --   --   --   --   --  4.0  --   --   --   --  3.6  CL  --  92*  --   --   --   --   --  95*  --   --   --   --  97*  CO2  --  26  --   --   --   --   --  24  --   --   --   --  27  GLUCOSE  --  110*  --   --   --   --   --  165*  --   --   --   --  118*  BUN   --  <5*  --   --   --   --   --  7  --   --   --   --  <5*  CREATININE  --  0.46*  --   --   --   --   --  0.60*  --   --   --   --  0.47*  CALCIUM   --  8.0*  --   --   --   --   --  8.2*  --   --   --   --  7.9*  MG 1.7  --   --  1.9  --  1.8  --  1.7  --   --   --   --  1.7  PHOS 2.6  --   --  3.8  --  4.8*  --  5.1*  --   --   --   --  5.1*   < > = values in this interval not displayed.   Liver Function Tests: No results for input(s): AST, ALT, ALKPHOS, BILITOT, PROT, ALBUMIN  in the last 168 hours. No results for input(s): LIPASE, AMYLASE in the last 168 hours. No results for input(s): AMMONIA in the last 168 hours. CBC: Recent Labs  Lab 12/06/23 0551 12/07/23 0802 12/09/23 1645 12/12/23 0609  WBC 7.6 7.4 8.3 7.7  HGB 10.5* 11.1* 11.2* 9.9*  HCT 31.2* 33.2* 33.1* 29.6*  MCV 83.2 83.8 83.6 82.2  PLT 264 294 331 440*   Cardiac Enzymes: No results for input(s): CKTOTAL,  CKMB, CKMBINDEX, TROPONINI in the last 168 hours. BNP: Invalid input(s): POCBNP CBG: No results for input(s): GLUCAP in the last 168 hours. D-Dimer No results for input(s): DDIMER in the last 72 hours. Hgb A1c No results for input(s): HGBA1C in the last 72 hours. Lipid Profile No results for input(s): CHOL, HDL, LDLCALC, TRIG, CHOLHDL, LDLDIRECT in the last 72 hours. Thyroid  function studies No results for input(s): TSH, T4TOTAL, T3FREE, THYROIDAB in the last 72 hours.  Invalid input(s): FREET3 Anemia work up No results for input(s): VITAMINB12, FOLATE, FERRITIN, TIBC, IRON , RETICCTPCT in the last 72 hours. Urinalysis    Component Value Date/Time   COLORURINE STRAW (A) 12/03/2023 0941   APPEARANCEUR CLEAR 12/03/2023 0941   LABSPEC 1.012 12/03/2023 0941   PHURINE 6.0 12/03/2023 0941   GLUCOSEU NEGATIVE 12/03/2023 0941   HGBUR NEGATIVE 12/03/2023 0941   BILIRUBINUR NEGATIVE 12/03/2023 0941   KETONESUR 20 (A) 12/03/2023 0941    PROTEINUR NEGATIVE 12/03/2023 0941   UROBILINOGEN 0.2 08/02/2009 2144   NITRITE NEGATIVE 12/03/2023 0941   LEUKOCYTESUR NEGATIVE 12/03/2023 0941   Sepsis Labs Recent Labs  Lab 12/06/23 0551 12/07/23 0802 12/09/23 1645 12/12/23 0609  WBC 7.6 7.4 8.3 7.7   Microbiology Recent Results (from the past 240 hours)  MRSA Next Gen by PCR, Nasal     Status: None   Collection Time: 12/03/23  2:26 PM   Specimen: Nasal Mucosa; Nasal Swab  Result Value Ref Range Status   MRSA by PCR Next Gen NOT DETECTED NOT DETECTED Final    Comment: (NOTE) The GeneXpert MRSA Assay (FDA approved for NASAL specimens only), is one component of a comprehensive MRSA colonization surveillance program. It is not intended to diagnose MRSA infection nor to guide or monitor treatment for MRSA infections. Test performance is not FDA approved in patients less than 22 years old. Performed at [facility], [address] [place], [place] [address]      Time coordinating discharge: Over 30 minutes  SIGNED:   Darcel Dawley, MD  [facility] 12/12/2023, 2:03 PM Pager   If 7PM-7AM, please contact night-coverage

## 2023-12-12 NOTE — TOC Progression Note (Addendum)
 Transition of Care [facility]) - Progression Note    Patient Details  Name: Jake Nguyen MRN: [id number] Date of Birth: 04/06/1966  Transition of Care [facility]) CM/SW Contact  Isaiah Public, LCSWA Phone Number: 12/12/2023, 11:11 AM  Clinical Narrative:     Patient informed CSW will need bus pass to [facility]. CSW provided bus pass.Patient requested cushion for wheelchair. CSW informed CM Erminio.  Expected Discharge Plan:  (D/C to the [facility]) Barriers to Discharge: No Barriers Identified               Expected Discharge Plan and Services In-house Referral: Clinical Social Work Discharge Planning Services: CM Consult Post Acute Care Choice: NA Living arrangements for the past 2 months: Homeless Expected Discharge Date: 12/12/23                 DME Agency: NA                   Social Drivers of Health (SDOH) Interventions SDOH Screenings   Food Insecurity: Food Insecurity Present (12/05/2023)  Housing: High Risk (12/05/2023)  Transportation Needs: Unmet Transportation Needs (12/05/2023)  Utilities: Patient Declined (12/05/2023)  Alcohol  Screen: Medium Risk (11/25/2020)  Depression (PHQ2-9): Low Risk  (11/06/2019)  Social Connections: Socially Isolated (07/20/2023)  Tobacco Use: High Risk (12/03/2023)    Readmission Risk Interventions    11/15/2023   11:58 AM  Readmission Risk Prevention Plan  Transportation Screening Complete  Medication Review (RN Care Manager) Complete  PCP or Specialist appointment within 3-5 days of discharge Complete  HRI or Home Care Consult Complete  SW Recovery Care/Counseling Consult Complete  Palliative Care Screening Not Applicable  Skilled Nursing Facility Complete

## 2023-12-12 NOTE — Plan of Care (Addendum)
 Pt requiring multiple attempts to allow for vitals. Refusing wound care as well. Allowed brief wound assessment at shift change. Dressings look intact and no soiling observed. Will continue to encourage participation in care.   Problem: Education: Goal: Knowledge of General Education information will improve Description: Including pain rating scale, medication(s)/side effects and non-pharmacologic comfort measures Outcome: Progressing   Problem: Clinical Measurements: Goal: Ability to maintain clinical measurements within normal limits will improve Outcome: Progressing Goal: Respiratory complications will improve Outcome: Progressing Goal: Cardiovascular complication will be avoided Outcome: Progressing   Problem: Nutrition: Goal: Adequate nutrition will be maintained Outcome: Progressing   Problem: Elimination: Goal: Will not experience complications related to urinary retention Outcome: Progressing   Problem: Pain Managment: Goal: General experience of comfort will improve and/or be controlled Outcome: Progressing

## 2023-12-21 ENCOUNTER — Emergency Department (HOSPITAL_COMMUNITY): Payer: MEDICAID

## 2023-12-21 ENCOUNTER — Other Ambulatory Visit: Payer: Self-pay

## 2023-12-21 ENCOUNTER — Inpatient Hospital Stay (HOSPITAL_COMMUNITY)
Admission: EM | Admit: 2023-12-21 | Discharge: 2023-12-29 | DRG: 564 | Disposition: A | Discharge status: Skilled Nursing Facility | Payer: MEDICAID | Attending: Internal Medicine | Admitting: Internal Medicine

## 2023-12-21 ENCOUNTER — Encounter (HOSPITAL_COMMUNITY): Payer: Self-pay

## 2023-12-21 DIAGNOSIS — Z79899 Other long term (current) drug therapy: Secondary | ICD-10-CM | POA: Diagnosis not present

## 2023-12-21 DIAGNOSIS — Z5900 Homelessness unspecified: Secondary | ICD-10-CM

## 2023-12-21 DIAGNOSIS — M8660 Other chronic osteomyelitis, unspecified site: Secondary | ICD-10-CM | POA: Diagnosis not present

## 2023-12-21 DIAGNOSIS — A419 Sepsis, unspecified organism: Principal | ICD-10-CM | POA: Diagnosis present

## 2023-12-21 DIAGNOSIS — L89316 Pressure-induced deep tissue damage of right buttock: Secondary | ICD-10-CM | POA: Diagnosis present

## 2023-12-21 DIAGNOSIS — Z1152 Encounter for screening for COVID-19: Secondary | ICD-10-CM

## 2023-12-21 DIAGNOSIS — R079 Chest pain, unspecified: Secondary | ICD-10-CM

## 2023-12-21 DIAGNOSIS — L0390 Cellulitis, unspecified: Secondary | ICD-10-CM

## 2023-12-21 DIAGNOSIS — L8990 Pressure ulcer of unspecified site, unspecified stage: Secondary | ICD-10-CM | POA: Diagnosis not present

## 2023-12-21 DIAGNOSIS — L309 Dermatitis, unspecified: Secondary | ICD-10-CM | POA: Diagnosis present

## 2023-12-21 DIAGNOSIS — M8668 Other chronic osteomyelitis, other site: Secondary | ICD-10-CM | POA: Diagnosis present

## 2023-12-21 DIAGNOSIS — F1020 Alcohol dependence, uncomplicated: Secondary | ICD-10-CM | POA: Diagnosis present

## 2023-12-21 DIAGNOSIS — I959 Hypotension, unspecified: Secondary | ICD-10-CM | POA: Diagnosis present

## 2023-12-21 DIAGNOSIS — Z5982 Transportation insecurity: Secondary | ICD-10-CM

## 2023-12-21 DIAGNOSIS — L89326 Pressure-induced deep tissue damage of left buttock: Secondary | ICD-10-CM | POA: Diagnosis present

## 2023-12-21 DIAGNOSIS — Z89612 Acquired absence of left leg above knee: Secondary | ICD-10-CM | POA: Diagnosis not present

## 2023-12-21 DIAGNOSIS — L89896 Pressure-induced deep tissue damage of other site: Secondary | ICD-10-CM | POA: Diagnosis present

## 2023-12-21 DIAGNOSIS — F17210 Nicotine dependence, cigarettes, uncomplicated: Secondary | ICD-10-CM | POA: Diagnosis present

## 2023-12-21 DIAGNOSIS — E876 Hypokalemia: Secondary | ICD-10-CM | POA: Diagnosis present

## 2023-12-21 DIAGNOSIS — E861 Hypovolemia: Secondary | ICD-10-CM | POA: Diagnosis present

## 2023-12-21 DIAGNOSIS — F1010 Alcohol abuse, uncomplicated: Secondary | ICD-10-CM

## 2023-12-21 DIAGNOSIS — I480 Paroxysmal atrial fibrillation: Secondary | ICD-10-CM | POA: Diagnosis present

## 2023-12-21 DIAGNOSIS — I998 Other disorder of circulatory system: Secondary | ICD-10-CM

## 2023-12-21 DIAGNOSIS — L03115 Cellulitis of right lower limb: Secondary | ICD-10-CM

## 2023-12-21 DIAGNOSIS — Z515 Encounter for palliative care: Secondary | ICD-10-CM

## 2023-12-21 DIAGNOSIS — F1024 Alcohol dependence with alcohol-induced mood disorder: Secondary | ICD-10-CM | POA: Diagnosis present

## 2023-12-21 DIAGNOSIS — E860 Dehydration: Secondary | ICD-10-CM | POA: Diagnosis present

## 2023-12-21 DIAGNOSIS — Z5948 Other specified lack of adequate food: Secondary | ICD-10-CM

## 2023-12-21 DIAGNOSIS — Y835 Amputation of limb(s) as the cause of abnormal reaction of the patient, or of later complication, without mention of misadventure at the time of the procedure: Secondary | ICD-10-CM | POA: Diagnosis present

## 2023-12-21 DIAGNOSIS — L03317 Cellulitis of buttock: Secondary | ICD-10-CM | POA: Diagnosis present

## 2023-12-21 DIAGNOSIS — L89210 Pressure ulcer of right hip, unstageable: Secondary | ICD-10-CM | POA: Diagnosis not present

## 2023-12-21 DIAGNOSIS — Z91014 Allergy to mammalian meats: Secondary | ICD-10-CM | POA: Diagnosis not present

## 2023-12-21 DIAGNOSIS — E871 Hypo-osmolality and hyponatremia: Secondary | ICD-10-CM | POA: Diagnosis present

## 2023-12-21 DIAGNOSIS — Z5941 Food insecurity: Secondary | ICD-10-CM

## 2023-12-21 DIAGNOSIS — T8743 Infection of amputation stump, right lower extremity: Secondary | ICD-10-CM | POA: Diagnosis present

## 2023-12-21 DIAGNOSIS — Z89511 Acquired absence of right leg below knee: Secondary | ICD-10-CM | POA: Diagnosis not present

## 2023-12-21 DIAGNOSIS — L03116 Cellulitis of left lower limb: Secondary | ICD-10-CM | POA: Diagnosis present

## 2023-12-21 LAB — LACTIC ACID, PLASMA
Lactic Acid, Venous: 1.6 mmol/L (ref 0.5–1.9)
Lactic Acid, Venous: 1.1 mmol/L (ref 0.5–1.9)

## 2023-12-21 LAB — URINE DRUG SCREEN
Amphetamines: NEGATIVE
Barbiturates: NEGATIVE
Benzodiazepines: NEGATIVE
Cocaine: NEGATIVE
Fentanyl: NEGATIVE
Methadone Scn, Ur: NEGATIVE
Opiates: NEGATIVE
Tetrahydrocannabinol: POSITIVE — AB

## 2023-12-21 LAB — CBC
HCT: 28.8 % — ABNORMAL LOW (ref 39.0–52.0)
Hemoglobin: 9.5 g/dL — ABNORMAL LOW (ref 13.0–17.0)
MCH: 27.1 pg (ref 26.0–34.0)
MCHC: 33 g/dL (ref 30.0–36.0)
MCV: 82.3 fL (ref 80.0–100.0)
Platelets: 483 K/uL — ABNORMAL HIGH (ref 150–400)
RBC: 3.5 MIL/uL — ABNORMAL LOW (ref 4.22–5.81)
RDW: 15.4 % (ref 11.5–15.5)
WBC: 12.2 K/uL — ABNORMAL HIGH (ref 4.0–10.5)
nRBC: 0 % (ref 0.0–0.2)

## 2023-12-21 LAB — BASIC METABOLIC PANEL WITH GFR
Anion gap: 14 (ref 5–15)
BUN: <5 mg/dL — ABNORMAL LOW (ref 6–20)
CO2: 20 mmol/L — ABNORMAL LOW (ref 22–32)
Calcium: 7.4 mg/dL — ABNORMAL LOW (ref 8.9–10.3)
Chloride: 92 mmol/L — ABNORMAL LOW (ref 98–111)
Creatinine, Ser: 0.39 mg/dL — ABNORMAL LOW (ref 0.61–1.24)
GFR, Estimated: >60 mL/min (ref >60)
Glucose, Bld: 107 mg/dL — ABNORMAL HIGH (ref 70–99)
Potassium: 3.8 mmol/L (ref 3.5–5.1)
Sodium: 126 mmol/L — ABNORMAL LOW (ref 135–145)

## 2023-12-21 LAB — TROPONIN T, HIGH SENSITIVITY
Troponin T High Sensitivity: 24 ng/L — ABNORMAL HIGH (ref 0–19)
Troponin T High Sensitivity: 31 ng/L — ABNORMAL HIGH (ref 0–19)

## 2023-12-21 LAB — ETHANOL: Alcohol, Ethyl (B): 151 mg/dL — ABNORMAL HIGH (ref <15)

## 2023-12-21 LAB — RESP PANEL BY RT-PCR (RSV, FLU A&B, COVID)  RVPGX2
Influenza A by PCR: NEGATIVE
Influenza B by PCR: NEGATIVE
Resp Syncytial Virus by PCR: NEGATIVE
SARS Coronavirus 2 by RT PCR: NEGATIVE

## 2023-12-21 LAB — MRSA NEXT GEN BY PCR, NASAL: MRSA by PCR Next Gen: NOT DETECTED

## 2023-12-21 LAB — MAGNESIUM: Magnesium: 2 mg/dL (ref 1.7–2.4)

## 2023-12-21 MED ORDER — LORAZEPAM 2 MG/ML IJ SOLN
0.0000 mg | Freq: Three times a day (TID) | INTRAMUSCULAR | Status: DC
  Filled 2023-12-21: qty 1

## 2023-12-21 MED ORDER — MIRTAZAPINE 15 MG PO TABS
15.0000 mg | ORAL_TABLET | Freq: Every day | ORAL | Status: DC
  Administered 2023-12-21 – 2023-12-28 (×8): 15 mg via ORAL
  Filled 2023-12-21 (×8): qty 1

## 2023-12-21 MED ORDER — DIPHENHYDRAMINE HCL 50 MG/ML IJ SOLN
12.5000 mg | Freq: Once | INTRAMUSCULAR | Status: AC
  Administered 2023-12-21: 12.5 mg via INTRAVENOUS
  Filled 2023-12-21: qty 1

## 2023-12-21 MED ORDER — SODIUM CHLORIDE 0.9 % IV BOLUS
1000.0000 mL | Freq: Once | INTRAVENOUS | Status: AC
  Administered 2023-12-21: 1000 mL via INTRAVENOUS

## 2023-12-21 MED ORDER — METOPROLOL TARTRATE 12.5 MG HALF TABLET
12.5000 mg | ORAL_TABLET | Freq: Two times a day (BID) | ORAL | Status: DC
Start: 2023-12-22 — End: 2023-12-23
  Administered 2023-12-22 (×2): 12.5 mg via ORAL
  Filled 2023-12-21 (×3): qty 1

## 2023-12-21 MED ORDER — ADULT MULTIVITAMIN W/MINERALS CH
1.0000 | ORAL_TABLET | Freq: Every day | ORAL | Status: DC
  Administered 2023-12-21 – 2023-12-29 (×9): 1 via ORAL
  Filled 2023-12-21 (×9): qty 1

## 2023-12-21 MED ORDER — POLYETHYLENE GLYCOL 3350 17 G PO PACK: 17.0000 g | PACK | Freq: Every day | ORAL | Status: DC | PRN

## 2023-12-21 MED ORDER — THIAMINE HCL 100 MG/ML IJ SOLN
100.0000 mg | Freq: Every day | INTRAMUSCULAR | Status: DC
  Administered 2023-12-22 – 2023-12-23 (×2): 100 mg via INTRAVENOUS
  Filled 2023-12-21 (×4): qty 2

## 2023-12-21 MED ORDER — LORAZEPAM 2 MG/ML IJ SOLN
0.0000 mg | INTRAMUSCULAR | Status: DC
  Administered 2023-12-22 (×3): 1 mg via INTRAVENOUS
  Administered 2023-12-22 – 2023-12-23 (×2): 2 mg via INTRAVENOUS
  Filled 2023-12-21 (×7): qty 1

## 2023-12-21 MED ORDER — ENOXAPARIN SODIUM 40 MG/0.4ML IJ SOSY
40.0000 mg | PREFILLED_SYRINGE | INTRAMUSCULAR | Status: DC
  Administered 2023-12-21 – 2023-12-28 (×7): 40 mg via SUBCUTANEOUS
  Filled 2023-12-21 (×8): qty 0.4

## 2023-12-21 MED ORDER — VANCOMYCIN HCL IN DEXTROSE 1-5 GM/200ML-% IV SOLN
1000.0000 mg | Freq: Two times a day (BID) | INTRAVENOUS | Status: DC
  Administered 2023-12-22 – 2023-12-24 (×5): 1000 mg via INTRAVENOUS
  Filled 2023-12-21 (×5): qty 200

## 2023-12-21 MED ORDER — BISACODYL 5 MG PO TBEC: 5.0000 mg | DELAYED_RELEASE_TABLET | Freq: Every day | ORAL | Status: DC | PRN

## 2023-12-21 MED ORDER — ACETAMINOPHEN 325 MG PO TABS: 650.0000 mg | ORAL_TABLET | Freq: Four times a day (QID) | ORAL | Status: DC | PRN

## 2023-12-21 MED ORDER — HYDROMORPHONE HCL 1 MG/ML IJ SOLN
1.0000 mg | Freq: Once | INTRAMUSCULAR | Status: AC
  Administered 2023-12-21: 1 mg via INTRAVENOUS
  Filled 2023-12-21: qty 1

## 2023-12-21 MED ORDER — IOHEXOL 300 MG/ML  SOLN
100.0000 mL | Freq: Once | INTRAMUSCULAR | Status: AC | PRN
  Administered 2023-12-21: 100 mL via INTRAVENOUS

## 2023-12-21 MED ORDER — CHLORHEXIDINE GLUCONATE CLOTH 2 % EX PADS
6.0000 | MEDICATED_PAD | Freq: Every day | CUTANEOUS | Status: DC
  Administered 2023-12-21 – 2023-12-24 (×4): 6 via TOPICAL

## 2023-12-21 MED ORDER — SODIUM CHLORIDE 0.9% FLUSH
3.0000 mL | Freq: Two times a day (BID) | INTRAVENOUS | Status: DC
Start: 2023-12-21 — End: 2023-12-29
  Administered 2023-12-21 – 2023-12-29 (×14): 3 mL via INTRAVENOUS

## 2023-12-21 MED ORDER — TRAZODONE HCL 50 MG PO TABS
50.0000 mg | ORAL_TABLET | Freq: Every evening | ORAL | Status: DC | PRN
  Administered 2023-12-26 – 2023-12-28 (×2): 50 mg via ORAL
  Filled 2023-12-21 (×2): qty 1

## 2023-12-21 MED ORDER — ACETAMINOPHEN 500 MG PO TABS
1000.0000 mg | ORAL_TABLET | Freq: Once | ORAL | Status: AC
  Administered 2023-12-21: 1000 mg via ORAL
  Filled 2023-12-21: qty 2

## 2023-12-21 MED ORDER — OXYCODONE HCL 5 MG PO TABS
5.0000 mg | ORAL_TABLET | ORAL | Status: DC | PRN
  Administered 2023-12-21 – 2023-12-29 (×7): 5 mg via ORAL
  Filled 2023-12-21 (×7): qty 1

## 2023-12-21 MED ORDER — ACETAMINOPHEN 650 MG RE SUPP: 650.0000 mg | Freq: Four times a day (QID) | RECTAL | Status: DC | PRN

## 2023-12-21 MED ORDER — FOLIC ACID 1 MG PO TABS
1.0000 mg | ORAL_TABLET | Freq: Every day | ORAL | Status: DC
  Administered 2023-12-21 – 2023-12-29 (×9): 1 mg via ORAL
  Filled 2023-12-21 (×9): qty 1

## 2023-12-21 MED ORDER — VANCOMYCIN HCL IN DEXTROSE 1-5 GM/200ML-% IV SOLN
1000.0000 mg | Freq: Once | INTRAVENOUS | Status: AC
  Administered 2023-12-21: 1000 mg via INTRAVENOUS
  Filled 2023-12-21: qty 200

## 2023-12-21 MED ORDER — THIAMINE MONONITRATE 100 MG PO TABS
100.0000 mg | ORAL_TABLET | Freq: Every day | ORAL | Status: DC
  Administered 2023-12-21 – 2023-12-29 (×7): 100 mg via ORAL
  Filled 2023-12-21 (×8): qty 1

## 2023-12-21 MED ORDER — LORAZEPAM 1 MG PO TABS: 1.0000 mg | ORAL_TABLET | ORAL | Status: AC | PRN

## 2023-12-21 MED ORDER — HYDROMORPHONE HCL 1 MG/ML IJ SOLN
0.5000 mg | INTRAMUSCULAR | Status: DC | PRN
  Administered 2023-12-21 – 2023-12-28 (×6): 0.5 mg via INTRAVENOUS
  Filled 2023-12-21 (×7): qty 1

## 2023-12-21 MED ORDER — SODIUM CHLORIDE 0.9 % IV SOLN
2.0000 g | Freq: Three times a day (TID) | INTRAVENOUS | Status: DC
  Administered 2023-12-21 – 2023-12-24 (×8): 2 g via INTRAVENOUS
  Filled 2023-12-21 (×9): qty 12.5

## 2023-12-21 MED ORDER — PROCHLORPERAZINE EDISYLATE 10 MG/2ML IJ SOLN: 5.0000 mg | Freq: Four times a day (QID) | INTRAMUSCULAR | Status: DC | PRN

## 2023-12-21 MED ORDER — METRONIDAZOLE 500 MG/100ML IV SOLN
500.0000 mg | Freq: Once | INTRAVENOUS | Status: AC
  Administered 2023-12-21: 500 mg via INTRAVENOUS
  Filled 2023-12-21: qty 100

## 2023-12-21 MED ORDER — SODIUM CHLORIDE 0.9 % IV SOLN
2.0000 g | Freq: Once | INTRAVENOUS | Status: AC
  Administered 2023-12-21: 2 g via INTRAVENOUS
  Filled 2023-12-21: qty 12.5

## 2023-12-21 MED ORDER — LORAZEPAM 2 MG/ML IJ SOLN
1.0000 mg | INTRAMUSCULAR | Status: AC | PRN
  Administered 2023-12-21 – 2023-12-23 (×2): 2 mg via INTRAVENOUS

## 2023-12-21 NOTE — ED Provider Notes (Addendum)
 [place] EMERGENCY DEPARTMENT AT [facility] Provider Note   CSN: [id number] Arrival date & time: 12/21/23  1429     Patient presents with: Chest Pain and Wound Infection   Jake Nguyen is a 58 y.o. male.   Pt is a 58 yo male with pmhx significant for alcoholism, depression, anxiety, osteomyelitis s/p R BKA and left AKA, hyponatremia and sepsis.  Pt was admitted 8/24-9/2 for sepsis and hyponatremia.  They wanted to admit him to a SNF upon d/c, but pt refused.  He is homeless.  He called EMS due to CP.  He arrives diffusely soiled in urine and in feces.  He is wearing the paper scrubs that he was d/c with on 9/2.  They are sticking to his lower body.  Pt is a poor historian.       Prior to Admission medications   Medication Sig Start Date End Date Taking? Authorizing Provider  docusate sodium  (COLACE) 100 MG capsule Take 1 capsule (100 mg total) by mouth 2 (two) times daily as needed for mild constipation. 12/12/23   Leotis Bogus, MD  folic acid  (FOLVITE ) 1 MG tablet Take 1 tablet (1 mg total) by mouth daily. 12/13/23 01/12/24  Leotis Bogus, MD  metoprolol  tartrate (LOPRESSOR ) 25 MG tablet Take 1 tablet (25 mg total) by mouth 2 (two) times daily. 12/29/23 01/28/24  Regalado, Owen A, MD  mirtazapine  (REMERON ) 15 MG tablet Take 1 tablet (15 mg total) by mouth at bedtime. 12/12/23 01/11/24  Leotis Bogus, MD  Multiple Vitamin (MULTIVITAMIN WITH MINERALS) TABS tablet Take 1 tablet by mouth daily. 12/30/23   Regalado, Belkys A, MD  oxyCODONE  (OXY IR/ROXICODONE ) 5 MG immediate release tablet Take 1 tablet (5 mg total) by mouth every 4 (four) hours as needed for moderate pain (pain score 4-6). 12/29/23   Regalado, Belkys A, MD  polyethylene glycol powder (GLYCOLAX /MIRALAX ) 17 GM/SCOOP powder Dissolve 1 capful (17 g) into liquid as directed and take by mouth daily 12/13/23   Leotis Bogus, MD  thiamine  (VITAMIN B1) 100 MG tablet Take 1 tablet (100 mg total) by mouth daily. 12/13/23  01/12/24  Leotis Bogus, MD  traZODone  (DESYREL ) 50 MG tablet Take 1 tablet (50 mg total) by mouth at bedtime as needed for sleep (for difficulty initiating sleep in patients with depressive symptoms). 12/29/23 01/28/24  Regalado, Owen LABOR, MD    Allergies: Bactoshield chg [chlorhexidine  gluconate] and Pork-derived products    Review of Systems  Cardiovascular:  Positive for chest pain.  All other systems reviewed and are negative.   Updated Vital Signs BP 104/65 (BP Location: Right Arm)   Pulse 92   Temp 98.9 F (37.2 C) (Oral)   Resp 18   Ht 5' 10 (1.778 m)   Wt 63.1 kg   SpO2 100%   BMI 19.96 kg/m   Physical Exam Vitals and nursing note reviewed.  Constitutional:      Appearance: He is well-developed. He is ill-appearing.  HENT:     Head: Normocephalic and atraumatic.  Eyes:     Extraocular Movements: Extraocular movements intact.     Pupils: Pupils are equal, round, and reactive to light.  Cardiovascular:     Rate and Rhythm: Regular rhythm. Tachycardia present.     Heart sounds: Normal heart sounds.  Pulmonary:     Effort: Pulmonary effort is normal.  Abdominal:     General: Bowel sounds are normal.     Palpations: Abdomen is soft.  Musculoskeletal:  General: Normal range of motion.     Cervical back: Normal range of motion and neck supple.  Skin:    General: Skin is warm.     Capillary Refill: Capillary refill takes less than 2 seconds.     Comments: Skin is dry with diffuse eczema; he has significant skin breakdown to his buttocks and upper thighs  Neurological:     General: No focal deficit present.     Mental Status: He is alert and oriented to person, place, and time.  Psychiatric:        Mood and Affect: Mood normal.        Behavior: Behavior normal.     (all labs ordered are listed, but only abnormal results are displayed) Labs Reviewed  BASIC METABOLIC PANEL WITH GFR - Abnormal; Notable for the following components:      Result Value    Sodium 126 (*)    Chloride 92 (*)    CO2 20 (*)    Glucose, Bld 107 (*)    BUN <5 (*)    Creatinine, Ser 0.39 (*)    Calcium  7.4 (*)    All other components within normal limits  CBC - Abnormal; Notable for the following components:   WBC 12.2 (*)    RBC 3.50 (*)    Hemoglobin 9.5 (*)    HCT 28.8 (*)    Platelets 483 (*)    All other components within normal limits  URINE DRUG SCREEN - Abnormal; Notable for the following components:   Tetrahydrocannabinol POSITIVE (*)    All other components within normal limits  ETHANOL - Abnormal; Notable for the following components:   Alcohol , Ethyl (B) 151 (*)    All other components within normal limits  CBC - Abnormal; Notable for the following components:   Hemoglobin 11.2 (*)    HCT 35.7 (*)    RDW 15.6 (*)    Platelets 559 (*)    All other components within normal limits  BASIC METABOLIC PANEL WITH GFR - Abnormal; Notable for the following components:   Sodium 134 (*)    Potassium 3.0 (*)    Glucose, Bld 111 (*)    BUN <5 (*)    Creatinine, Ser 0.44 (*)    Calcium  7.3 (*)    All other components within normal limits  HEPATIC FUNCTION PANEL - Abnormal; Notable for the following components:   Albumin  2.5 (*)    Indirect Bilirubin 0.2 (*)    All other components within normal limits  CBC WITH DIFFERENTIAL/PLATELET - Abnormal; Notable for the following components:   RBC 3.53 (*)    Hemoglobin 9.5 (*)    HCT 30.4 (*)    RDW 15.8 (*)    Monocytes Absolute 1.7 (*)    Eosinophils Absolute 1.2 (*)    All other components within normal limits  BASIC METABOLIC PANEL WITH GFR - Abnormal; Notable for the following components:   Sodium 134 (*)    Potassium 3.4 (*)    CO2 21 (*)    Glucose, Bld 122 (*)    BUN <5 (*)    Creatinine, Ser 0.45 (*)    Calcium  7.4 (*)    All other components within normal limits  CBC WITH DIFFERENTIAL/PLATELET - Abnormal; Notable for the following components:   RBC 3.14 (*)    Hemoglobin 8.4 (*)    HCT  26.6 (*)    RDW 15.7 (*)    Monocytes Absolute 1.3 (*)    Eosinophils  Absolute 1.2 (*)    All other components within normal limits  BASIC METABOLIC PANEL WITH GFR - Abnormal; Notable for the following components:   Potassium 2.8 (*)    Glucose, Bld 138 (*)    BUN <5 (*)    Creatinine, Ser 0.38 (*)    Calcium  7.6 (*)    All other components within normal limits  CBC WITH DIFFERENTIAL/PLATELET - Abnormal; Notable for the following components:   RBC 3.06 (*)    Hemoglobin 8.1 (*)    HCT 26.2 (*)    RDW 15.9 (*)    Eosinophils Absolute 1.0 (*)    All other components within normal limits  BASIC METABOLIC PANEL WITH GFR - Abnormal; Notable for the following components:   Glucose, Bld 114 (*)    BUN <5 (*)    Creatinine, Ser 0.36 (*)    Calcium  8.8 (*)    All other components within normal limits  CBC WITH DIFFERENTIAL/PLATELET - Abnormal; Notable for the following components:   RBC 3.52 (*)    Hemoglobin 9.2 (*)    HCT 30.8 (*)    MCHC 29.9 (*)    RDW 15.7 (*)    Eosinophils Absolute 0.8 (*)    All other components within normal limits  BASIC METABOLIC PANEL WITH GFR - Abnormal; Notable for the following components:   Glucose, Bld 108 (*)    BUN <5 (*)    Creatinine, Ser 0.36 (*)    All other components within normal limits  CBC - Abnormal; Notable for the following components:   RBC 3.40 (*)    Hemoglobin 9.0 (*)    HCT 28.7 (*)    RDW 15.9 (*)    All other components within normal limits  BASIC METABOLIC PANEL WITH GFR - Abnormal; Notable for the following components:   Glucose, Bld 107 (*)    BUN <5 (*)    Creatinine, Ser 0.37 (*)    Calcium  8.8 (*)    All other components within normal limits  TROPONIN T, HIGH SENSITIVITY - Abnormal; Notable for the following components:   Troponin T High Sensitivity 24 (*)    All other components within normal limits  TROPONIN T, HIGH SENSITIVITY - Abnormal; Notable for the following components:   Troponin T High Sensitivity 31  (*)    All other components within normal limits  CULTURE, BLOOD (ROUTINE X 2)  CULTURE, BLOOD (ROUTINE X 2)  RESP PANEL BY RT-PCR (RSV, FLU A&B, COVID)  RVPGX2  MRSA NEXT GEN BY PCR, NASAL  MAGNESIUM   LACTIC ACID, PLASMA  LACTIC ACID, PLASMA  MAGNESIUM   MAGNESIUM   MAGNESIUM   MAGNESIUM   MAGNESIUM     EKG: None  Radiology: No results found.    Procedures   Medications Ordered in the ED  LORazepam  (ATIVAN ) tablet 1-4 mg ( Oral See Alternative 12/23/23 1830)    Or  LORazepam  (ATIVAN ) injection 1-4 mg (2 mg Intravenous Given 12/23/23 1830)  sodium chloride  0.9 % bolus 1,000 mL (0 mLs Intravenous Stopped 12/21/23 1759)  ceFEPIme  (MAXIPIME ) 2 g in sodium chloride  0.9 % 100 mL IVPB (0 g Intravenous Stopped 12/21/23 1734)  metroNIDAZOLE  (FLAGYL ) IVPB 500 mg (0 mg Intravenous Stopped 12/21/23 1759)  vancomycin  (VANCOCIN ) IVPB 1000 mg/200 mL premix (0 mg Intravenous Stopped 12/21/23 1759)  acetaminophen  (TYLENOL ) tablet 1,000 mg (1,000 mg Oral Given 12/21/23 1734)  HYDROmorphone  (DILAUDID ) injection 1 mg (1 mg Intravenous Given 12/21/23 1839)  iohexol  (OMNIPAQUE ) 300 MG/ML solution 100 mL (100 mLs Intravenous Contrast  Given 12/21/23 1840)  sodium chloride  0.9 % bolus 1,000 mL (0 mLs Intravenous Stopped 12/21/23 2336)  sodium chloride  0.9 % bolus 1,000 mL (0 mLs Intravenous Stopped 12/21/23 2336)  diphenhydrAMINE  (BENADRYL ) injection 12.5 mg (12.5 mg Intravenous Given 12/21/23 2245)  potassium chloride  SA (KLOR-CON  M) CR tablet 40 mEq (40 mEq Oral Given 12/22/23 1147)  calcium  gluconate 2 g/ 100 mL sodium chloride  IVPB (0 mg Intravenous Stopped 12/22/23 1142)  sodium chloride  0.9 % bolus 1,000 mL (0 mLs Intravenous Stopped 12/23/23 0831)  sodium chloride  0.9 % bolus 1,000 mL (0 mLs Intravenous Stopped 12/23/23 1546)  sodium chloride  0.9 % bolus 1,000 mL (0 mLs Intravenous Stopped 12/23/23 1728)  potassium chloride  SA (KLOR-CON  M) CR tablet 40 mEq (40 mEq Oral Given 12/24/23 1438)  magnesium  sulfate  IVPB 2 g 50 mL (0 g Intravenous Stopped 12/24/23 1015)  magnesium  sulfate IVPB 2 g 50 mL (0 g Intravenous Stopped 12/27/23 0902)  lactated ringers  bolus 500 mL (0 mLs Intravenous Stopped 12/27/23 1841)  albumin  human 25 % solution 12.5 g (12.5 g Intravenous New Bag/Given 12/29/23 0206)                                    Medical Decision Making Amount and/or Complexity of Data Reviewed Labs: ordered. Radiology: ordered.  Risk OTC drugs. Prescription drug management. Decision regarding hospitalization.   This patient presents to the ED for concern of cp, this involves an extensive number of treatment options, and is a complaint that carries with it a high risk of complications and morbidity.  The differential diagnosis includes cardiac, pulm, gi, sepsis   Co morbidities that complicate the patient evaluation  alcoholism, depression, anxiety, osteomyelitis s/p R BKA and left AKA, hyponatremia and sepsis   Additional history obtained:  Additional history obtained from epic chart review External records from outside source obtained and reviewed including EMS report   Lab Tests:  I Ordered, and personally interpreted labs.  The pertinent results include:  cbc with wbc elevated at 12.2, hgb 9.5 (hgb 9.9 on 9/2); na low at 126, mg nl, lactic nl, etoh elevated at 151   Imaging Studies ordered:  I ordered imaging studies including cxr, ct chest, abd, pelvis/femurs  I independently visualized and interpreted imaging which showed  CXR: No acute process.  CT chest/abd/pelvis: No acute intrathoracic, abdominal, or pelvic pathology.  2. Fatty liver.  3. Cholelithiasis.  4. Aortic Atherosclerosis (ICD10-I70.0) and Emphysema (ICD10-J43.9).  CT L femur:  Mild subcutaneous edema of the distal femur. No soft tissue gas  or fluid collection identified.  2. Mild left inguinal lymphadenopathy.  3. Increased cortical thickening and sclerosis of the femoral  diaphysis without erosive  change. Findings may be related to chronic  osteomyelitis.  4. Above knee amputation changes.  CT R femur:  No acute bony abnormality.    Diffuse edema throughout the subcutaneous soft tissues.   I agree with the radiologist interpretation   Cardiac Monitoring:  The patient was maintained on a cardiac monitor.  I personally viewed and interpreted the cardiac monitored which showed an underlying rhythm of: st   Medicines ordered and prescription drug management:  I ordered medication including ivfs/abx/tylenol   for sx  Reevaluation of the patient after these medicines showed that the patient improved I have reviewed the patients home medicines and have made adjustments as needed   Test Considered:  ct   Critical Interventions:  Ivfs/iv abx   Consultations Obtained:  I requested consultation with the hospitalist (Dr. Charlton),  and discussed lab and imaging findings as well as pertinent plan -he will admit   Problem List / ED Course:  Sepsis:  cellulitis.  No nec fasc.  Code sepsis called.  Iv abx given. Etoh abuse:  pt will need ciwa Hyponatremia:  likely potomania.  Ivfs given Homeless:  will need toc   Reevaluation:  After the interventions noted above, I reevaluated the patient and found that they have :improved   Social Determinants of Health:  homeless   Dispostion:  After consideration of the diagnostic results and the patients response to treatment, I feel that the patent would benefit from admission.    CRITICAL CARE Performed by: Mliss Boyers   Total critical care time: 45 minutes  Critical care time was exclusive of separately billable procedures and treating other patients.  Critical care was necessary to treat or prevent imminent or life-threatening deterioration.  Critical care was time spent personally by me on the following activities: development of treatment plan with patient and/or surrogate as well as nursing, discussions with  consultants, evaluation of patient's response to treatment, examination of patient, obtaining history from patient or surrogate, ordering and performing treatments and interventions, ordering and review of laboratory studies, ordering and review of radiographic studies, pulse oximetry and re-evaluation of patient's condition.      Final diagnoses:  Sepsis without acute organ dysfunction, due to unspecified organism (HCC)  Cellulitis of buttock  Cellulitis of left lower extremity  Cellulitis of right lower extremity  ETOH abuse  Hyponatremia    ED Discharge Orders          Ordered    Multiple Vitamin (MULTIVITAMIN WITH MINERALS) TABS tablet  Daily        12/29/23 1147    metoprolol  tartrate (LOPRESSOR ) 25 MG tablet  2 times daily        12/29/23 1147    traZODone  (DESYREL ) 50 MG tablet  At bedtime PRN        12/29/23 1147    doxycycline  (VIBRA -TABS) 100 MG tablet  Every 12 hours        12/29/23 1147    oxyCODONE  (OXY IR/ROXICODONE ) 5 MG immediate release tablet  Every 4 hours PRN        12/29/23 1147    Increase activity slowly        12/29/23 1147    Diet - low sodium heart healthy        12/29/23 1147    Discharge wound care:       Comments: See above   12/29/23 1147               Boyers Mliss, MD 12/21/23 ACHILLE Boyers Mliss, MD 01/03/24 1859

## 2023-12-21 NOTE — ED Triage Notes (Incomplete)
 Pt bib EMS from [facility] on [address]. 2 companions called infections bilaterally in amputation. Pt says he hasn't had them cleaned in a month. HR  120. Pt reporting headache. Medication non-compliance.

## 2023-12-21 NOTE — ED Notes (Signed)
 IV access still not obtained but blood work obtained

## 2023-12-21 NOTE — Progress Notes (Signed)
 Pharmacy Antibiotic Note  Jake Nguyen is a 58 y.o. male admitted on 12/21/2023 with cellulitis.  Pharmacy has been consulted for vanc/cefepime  dosing.  Plan: Vanc 1g IV q12 - goal AUC 400-550 Cefepime  2g IV q8  Height: 5' 10 (177.8 cm) Weight: 63.1 kg (139 lb 1.8 oz) IBW/kg (Calculated) : 73  Temp (24hrs), Avg:100.1 F (37.8 C), Min:99.2 F (37.3 C), Max:100.9 F (38.3 C)  Recent Labs  Lab 12/21/23 1623 12/21/23 1631 12/21/23 1917  WBC  --  12.2*  --   CREATININE  --  0.39*  --   LATICACIDVEN 1.6  --  1.1    Estimated Creatinine Clearance: 92 mL/min (A) (by C-G formula based on SCr of 0.39 mg/dL (L)).    Allergies  Allergen Reactions   Pork-Derived Products Other (See Comments)    Pt states he doesn't eat pork      Thank you for allowing pharmacy to be a part of this patient's care.  Britta Eva Na 12/21/2023 8:06 PM

## 2023-12-21 NOTE — H&P (Signed)
 History and Physical    Jake Nguyen FMW:[id number] DOB: 1966-03-15 DOA: 12/21/2023  PCP: Pcp, No   Patient coming from: Home   Chief Complaint: Chest pain, b/l leg pain  HPI: Jake Nguyen is a 58 y.o. male with medical history significant for alcohol  abuse, depression, anxiety, left AKA in 2022, right BKA in May 2025, recent admissions with stump infections, hyponatremia, and recent rapid atrial fibrillation who comes in for evaluation of chest pain and bilateral leg pain.  Patient was discharged from the hospital on 12/12/2023 after treatment for acute on chronic hyponatremia, rapid atrial fibrillation, and lactic acidosis.  It was recommended that he go to an SNF on discharge but the patient refused.  He was picked up by EMS today wearing the same paper scrubs he was discharged in over a week ago which were said to be soiled in urine and feces and adherent to his skin.  Patient notes that he has been having fleeting episodes of sharp chest pain accompanied by sensation that his heart is racing.  He has also been experiencing increased pain in his bilateral lower extremities.  ED Course: Upon arrival to the ED, patient is found to be febrile to 38.3 C and saturating well on room air with elevated heart rate and SBP in the 90-110 range.  Labs are most notable for sodium 126, WBC 12,200, platelets 483,000, normal lactic acid, and troponin 24.  Blood cultures were collected in the ED and the patient was given 3 L NS, acetaminophen , Dilaudid , cefepime , vancomycin , and Flagyl .  Review of Systems:  All other systems reviewed and apart from HPI, are negative.  Past Medical History:  Diagnosis Date   Acute metabolic encephalopathy 11/14/2020   Alcohol  abuse with alcohol -induced mood disorder (HCC) 11/10/2019   Alcohol  withdrawal syndrome, with delirium (HCC) 11/06/2019   Alcoholism (HCC)    Anxiety    Depression    Influenza A 05/20/2018   Sepsis (HCC) 11/14/2020   Septic shock (HCC)  11/24/2020    Past Surgical History:  Procedure Laterality Date   ABDOMINAL AORTOGRAM W/LOWER EXTREMITY N/A 08/04/2020   Procedure: ABDOMINAL AORTOGRAM W/LOWER EXTREMITY;  Surgeon: Serene Gaile ORN, MD;  Location: [facility];  Service: Cardiovascular;  Laterality: N/A;   AMPUTATION Left 08/05/2020   Procedure: AMPUTATION BELOW KNEE;  Surgeon: Serene Gaile ORN, MD;  Location: [facility] OR;  Service: Vascular;  Laterality: Left;   AMPUTATION Left 11/21/2020   Procedure: REVISION LEFT ABOVE KNEE AMPUTATION;  Surgeon: Serene Gaile ORN, MD;  Location: [facility] OR;  Service: Vascular;  Laterality: Left;   AMPUTATION Right 08/20/2023   Procedure: AMPUTATION BELOW KNEE;  Surgeon: Harden Jerona GAILS, MD;  Location: [facility] OR;  Service: Orthopedics;  Laterality: Right;   APPLICATION OF WOUND VAC Right 08/20/2023   Procedure: APPLICATION, WOUND VAC;  Surgeon: Harden Jerona GAILS, MD;  Location: [facility] OR;  Service: Orthopedics;  Laterality: Right;    Social History:   reports that he has been smoking cigarettes. He has never used smokeless tobacco. He reports current alcohol  use. He reports current drug use. Drug: Marijuana.  Allergies  Allergen Reactions   Pork-Derived Products Other (See Comments)    Pt states he doesn't eat pork    Family History  Family history unknown: Yes     Prior to Admission medications   Medication Sig Start Date End Date Taking? Authorizing Provider  docusate sodium  (COLACE) 100 MG capsule Take 1 capsule (100 mg total) by mouth 2 (two) times daily  as needed for mild constipation. 12/12/23   Leotis Bogus, MD  folic acid  (FOLVITE ) 1 MG tablet Take 1 tablet (1 mg total) by mouth daily. 12/13/23 01/12/24  Leotis Bogus, MD  metoprolol  tartrate (LOPRESSOR ) 25 MG tablet Take 0.5 tablets (12.5 mg total) by mouth 2 (two) times daily. 12/12/23 01/11/24  Leotis Bogus, MD  mirtazapine  (REMERON ) 15 MG tablet Take 1 tablet (15 mg total) by mouth at bedtime. 12/12/23 01/11/24  Leotis Bogus, MD   polyethylene glycol powder (GLYCOLAX /MIRALAX ) 17 GM/SCOOP powder Dissolve 1 capful (17 g) into liquid as directed and take by mouth daily 12/13/23   Leotis Bogus, MD  thiamine  (VITAMIN B1) 100 MG tablet Take 1 tablet (100 mg total) by mouth daily. 12/13/23 01/12/24  Leotis Bogus, MD  traZODone  (DESYREL ) 50 MG tablet Take 1 tablet (50 mg total) by mouth at bedtime as needed for sleep (for difficulty initiating sleep in patients with depressive symptoms). 12/12/23 01/11/24  Leotis Bogus, MD    Physical Exam: Vitals:   12/21/23 1505 12/21/23 1506 12/21/23 1545 12/21/23 1659  BP:      Pulse: 93  (!) 140   Resp:  20 17   Temp:    (!) 100.9 F (38.3 C)  TempSrc:    Rectal  SpO2: 97%  99%   Weight:      Height:        Constitutional: NAD, no pallor or diaphoresis   Eyes: PERTLA, lids and conjunctivae normal ENMT: Mucous membranes are moist. Posterior pharynx clear of any exudate or lesions.   Neck: supple, no masses  Respiratory: no wheezing, no crackles. No accessory muscle use.  Cardiovascular: Rate ~120 and regular. No JVD. Abdomen: No tenderness, soft. Bowel sounds active.  Musculoskeletal: no clubbing / cyanosis. S/p right BKA and left AKA.   Skin: Excoriations about the LEs with surrounding erythema, edema, tenderness, heat, and weeping. Skin otherwise warm, dry, well-perfused. Neurologic: CN 2-12 grossly intact. Moving all extremities. Sleeping. Wakes to voice and oriented to person, place, and situation.  Psychiatric: Calm. Cooperative.    Labs and Imaging on Admission: I have personally reviewed following labs and imaging studies  CBC: Recent Labs  Lab 12/21/23 1631  WBC 12.2*  HGB 9.5*  HCT 28.8*  MCV 82.3  PLT 483*   Basic Metabolic Panel: Recent Labs  Lab 12/21/23 1631  NA 126*  K 3.8  CL 92*  CO2 20*  GLUCOSE 107*  BUN <5*  CREATININE 0.39*  CALCIUM  7.4*  MG 2.0   GFR: Estimated Creatinine Clearance: 92 mL/min (A) (by C-G formula based on SCr of  0.39 mg/dL (L)). Liver Function Tests: No results for input(s): AST, ALT, ALKPHOS, BILITOT, PROT, ALBUMIN  in the last 168 hours. No results for input(s): LIPASE, AMYLASE in the last 168 hours. No results for input(s): AMMONIA in the last 168 hours. Coagulation Profile: No results for input(s): INR, PROTIME in the last 168 hours. Cardiac Enzymes: No results for input(s): CKTOTAL, CKMB, CKMBINDEX, TROPONINI in the last 168 hours. BNP (last 3 results) No results for input(s): PROBNP in the last 8760 hours. HbA1C: No results for input(s): HGBA1C in the last 72 hours. CBG: No results for input(s): GLUCAP in the last 168 hours. Lipid Profile: No results for input(s): CHOL, HDL, LDLCALC, TRIG, CHOLHDL, LDLDIRECT in the last 72 hours. Thyroid  Function Tests: No results for input(s): TSH, T4TOTAL, FREET4, T3FREE, THYROIDAB in the last 72 hours. Anemia Panel: No results for input(s): VITAMINB12, FOLATE, FERRITIN, TIBC, IRON , RETICCTPCT in the  last 72 hours. Urine analysis:    Component Value Date/Time   COLORURINE STRAW (A) 12/03/2023 0941   APPEARANCEUR CLEAR 12/03/2023 0941   LABSPEC 1.012 12/03/2023 0941   PHURINE 6.0 12/03/2023 0941   GLUCOSEU NEGATIVE 12/03/2023 0941   HGBUR NEGATIVE 12/03/2023 0941   BILIRUBINUR NEGATIVE 12/03/2023 0941   KETONESUR 20 (A) 12/03/2023 0941   PROTEINUR NEGATIVE 12/03/2023 0941   UROBILINOGEN 0.2 08/02/2009 2144   NITRITE NEGATIVE 12/03/2023 0941   LEUKOCYTESUR NEGATIVE 12/03/2023 0941   Sepsis Labs: @LABRCNTIP (procalcitonin:4,lacticidven:4) )No results found for this or any previous visit (from the past 240 hours).   Radiological Exams on Admission: CT CHEST ABDOMEN PELVIS W CONTRAST Result Date: 12/21/2023 CLINICAL DATA:  Sepsis. EXAM: CT CHEST, ABDOMEN, AND PELVIS WITH CONTRAST TECHNIQUE: Multidetector CT imaging of the chest, abdomen and pelvis was performed following the  standard protocol during bolus administration of intravenous contrast. RADIATION DOSE REDUCTION: This exam was performed according to the departmental dose-optimization program which includes automated exposure control, adjustment of the mA and/or kV according to patient size and/or use of iterative reconstruction technique. CONTRAST:  OMNIPAQUE  IOHEXOL  300 MG/ML  SOLN COMPARISON:  CT abdomen pelvis dated 12/09/2023. FINDINGS: CT CHEST FINDINGS Cardiovascular: There is no cardiomegaly or pericardial effusion. The thoracic aorta is unremarkable. The central pulmonary arteries appear patent. Mediastinum/Nodes: No hilar or mediastinal adenopathy. The esophagus is grossly unremarkable. No mediastinal fluid collection. Lungs/Pleura: Background of centrilobular emphysema. No focal consolidation, pleural effusion, pneumothorax. The central airways are patent. Musculoskeletal: No acute osseous pathology. CT ABDOMEN PELVIS FINDINGS No intra-abdominal free air or free fluid. Hepatobiliary: Fatty liver. No biliary dilatation. Gallstone. No pericholecystic fluid or evidence of acute cholecystitis by CT. Pancreas: Unremarkable. No pancreatic ductal dilatation or surrounding inflammatory changes. Spleen: Normal in size without focal abnormality. Adrenals/Urinary Tract: The adrenal glands unremarkable. There is no hydronephrosis on either side. There is symmetric enhancement and excretion of contrast by both kidneys. The visualized ureters and urinary bladder appear unremarkable. Stomach/Bowel: There is no bowel obstruction or active inflammation. The appendix is normal. Vascular/Lymphatic: Mild aortoiliac atherosclerotic disease. The IVC is unremarkable. No portal venous gas. There is no adenopathy. Reproductive: The prostate is grossly unremarkable. Other: Mildly enlarged bilateral inguinal lymph nodes, likely reactive. Musculoskeletal: No acute osseous pathology.  Osteopenia. IMPRESSION: 1. No acute intrathoracic,  abdominal, or pelvic pathology. 2. Fatty liver. 3. Cholelithiasis. 4. Aortic Atherosclerosis (ICD10-I70.0) and Emphysema (ICD10-J43.9). Electronically Signed   By: Vanetta Chou M.D.   On: 12/21/2023 19:15   CT FEMUR LEFT W CONTRAST Result Date: 12/21/2023 CLINICAL DATA:  Evaluate for infection EXAM: CT OF THE LOWER LEFT EXTREMITY WITH CONTRAST TECHNIQUE: Multidetector CT imaging of the lower left extremity was performed according to the standard protocol following intravenous contrast administration. RADIATION DOSE REDUCTION: This exam was performed according to the departmental dose-optimization program which includes automated exposure control, adjustment of the mA and/or kV according to patient size and/or use of iterative reconstruction technique. CONTRAST:  OMNIPAQUE  IOHEXOL  300 MG/ML  SOLN COMPARISON:  Left knee MRI 11/20/2020 FINDINGS: Bones/Joint/Cartilage Above knee amputation changes are again seen. The bones are diffusely osteopenic. There is increased cortical thickening and sclerosis of the femoral diaphysis without erosive change. Along the distal stump of the femur, there is some heterotopic ossification. There is no significant hip joint effusion. Ligaments Suboptimally assessed by CT. Muscles and Tendons No fluid collection or soft tissue gas. Deep fascial planes are preserved. Soft tissues There is mild subcutaneous edema of the distal femur.  There are nonenlarged and mildly enlarged left inguinal lymph nodes measuring 11 mm. No soft tissue gas or fluid collection identified. IMPRESSION: 1. Mild subcutaneous edema of the distal femur. No soft tissue gas or fluid collection identified. 2. Mild left inguinal lymphadenopathy. 3. Increased cortical thickening and sclerosis of the femoral diaphysis without erosive change. Findings may be related to chronic osteomyelitis. 4. Above knee amputation changes. Electronically Signed   By: Greig Pique M.D.   On: 12/21/2023 19:13   CT FEMUR  RIGHT W CONTRAST Result Date: 12/21/2023 CLINICAL DATA:  Infection EXAM: CT OF THE LOWER RIGHT EXTREMITY WITH CONTRAST TECHNIQUE: Multidetector CT imaging of the lower right extremity was performed according to the standard protocol following intravenous contrast administration. RADIATION DOSE REDUCTION: This exam was performed according to the departmental dose-optimization program which includes automated exposure control, adjustment of the mA and/or kV according to patient size and/or use of iterative reconstruction technique. CONTRAST:  OMNIPAQUE  IOHEXOL  300 MG/ML  SOLN COMPARISON:  None Available. FINDINGS: Bones/Joint/Cartilage No acute bony abnormality. Specifically, no fracture, subluxation, or dislocation. No bone destruction to suggest osteomyelitis. Ligaments Suboptimally assessed by CT. Muscles and Tendons No acute findings Soft tissues Edema throughout the subcutaneous soft tissues diffusely. Focal cystic area along the skin medially in the left knee region measures up to 2.4 cm, favor sebaceous cyst. IMPRESSION: No acute bony abnormality. Diffuse edema throughout the subcutaneous soft tissues. Electronically Signed   By: Franky Crease M.D.   On: 12/21/2023 19:11   DG Chest Port 1 View Result Date: 12/21/2023 EXAM: 1 VIEW XRAY OF THE CHEST 12/21/2023 03:42:56 PM COMPARISON: None available. CLINICAL HISTORY: Chest pain. FINDINGS: LUNGS AND PLEURA: No focal pulmonary opacity. No pulmonary edema. No pleural effusion. No pneumothorax. HEART AND MEDIASTINUM: No acute abnormality of the cardiac and mediastinal silhouettes. BONES AND SOFT TISSUES: No acute osseous abnormality. IMPRESSION: 1. No acute process. Electronically signed by: Dayne Hassell MD 12/21/2023 03:45 PM EDT RP Workstation: [id number]     Assessment/Plan   1. Bilateral LE skin/soft tissue infection  - Superficial-appearing wounds and excoriations involving right BKA and left AKA stumps with surrounding cellulitis and CT  findings suggestive of chronic osteomyelitis on the left  - Continue empiric antibiotics and wound care, follow cultures and clinical course    2. Hyponatremia  - Chronic and attributed to excessive free water/beer drinker's potomania  - He was hypovolemic and has been fluid-resuscitated in ED with NS  - Restrict free water, trend sodium level    3. Chest pain  - Reports sharp pains and palpitations lasting seconds at a time  - He was recently diagnosed with rapid a fib and symptoms may be due that  - Initial troponin is 24  - Check EKG, repeat troponin, continue cardiac monitoring    4. Atrial fibrillation  - Diagnosed during recent hospitalization; not anticoagulated - Continue metoprolol  as BP allows    5. Alcohol  abuse  - Monitor with CIWA, use Ativan  as-needed, supplement vitamins     DVT prophylaxis: Lovenox   Code Status: Full  Level of Care: Level of care: Stepdown Family Communication: none present  Disposition Plan:  Patient is from: Home  Anticipated d/c is to: TBD Anticipated d/c date is: 12/24/23  Patient currently: Pending treatment of infection, stable electrolytes, disposition planning  Consults called: None  Admission status: Inpatient     Evalene GORMAN Sprinkles, MD [facility]  12/21/2023, 7:58 PM

## 2023-12-21 NOTE — ED Notes (Signed)
 Trying to obtain access. Already had 2 failed attempts

## 2023-12-21 NOTE — Sepsis Progress Note (Signed)
 Elink will follow per code sepsis protocol.

## 2023-12-22 DIAGNOSIS — A419 Sepsis, unspecified organism: Secondary | ICD-10-CM | POA: Diagnosis not present

## 2023-12-22 DIAGNOSIS — R079 Chest pain, unspecified: Secondary | ICD-10-CM

## 2023-12-22 DIAGNOSIS — M8660 Other chronic osteomyelitis, unspecified site: Secondary | ICD-10-CM

## 2023-12-22 DIAGNOSIS — L0390 Cellulitis, unspecified: Secondary | ICD-10-CM | POA: Diagnosis not present

## 2023-12-22 DIAGNOSIS — I480 Paroxysmal atrial fibrillation: Secondary | ICD-10-CM | POA: Diagnosis not present

## 2023-12-22 LAB — BASIC METABOLIC PANEL WITH GFR
Anion gap: 12 (ref 5–15)
BUN: <5 mg/dL — ABNORMAL LOW (ref 6–20)
CO2: 22 mmol/L (ref 22–32)
Calcium: 7.3 mg/dL — ABNORMAL LOW (ref 8.9–10.3)
Chloride: 100 mmol/L (ref 98–111)
Creatinine, Ser: 0.44 mg/dL — ABNORMAL LOW (ref 0.61–1.24)
GFR, Estimated: >60 mL/min (ref >60)
Glucose, Bld: 111 mg/dL — ABNORMAL HIGH (ref 70–99)
Potassium: 3 mmol/L — ABNORMAL LOW (ref 3.5–5.1)
Sodium: 134 mmol/L — ABNORMAL LOW (ref 135–145)

## 2023-12-22 LAB — CBC
HCT: 35.7 % — ABNORMAL LOW (ref 39.0–52.0)
Hemoglobin: 11.2 g/dL — ABNORMAL LOW (ref 13.0–17.0)
MCH: 26.5 pg (ref 26.0–34.0)
MCHC: 31.4 g/dL (ref 30.0–36.0)
MCV: 84.6 fL (ref 80.0–100.0)
Platelets: 559 K/uL — ABNORMAL HIGH (ref 150–400)
RBC: 4.22 MIL/uL (ref 4.22–5.81)
RDW: 15.6 % — ABNORMAL HIGH (ref 11.5–15.5)
WBC: 8.6 K/uL (ref 4.0–10.5)
nRBC: 0 % (ref 0.0–0.2)

## 2023-12-22 LAB — HEPATIC FUNCTION PANEL
ALT: 14 U/L (ref 0–44)
AST: 35 U/L (ref 15–41)
Albumin: 2.5 g/dL — ABNORMAL LOW (ref 3.5–5.0)
Alkaline Phosphatase: 75 U/L (ref 38–126)
Bilirubin, Direct: 0.2 mg/dL (ref 0.0–0.2)
Indirect Bilirubin: 0.2 mg/dL — ABNORMAL LOW (ref 0.3–0.9)
Total Bilirubin: 0.4 mg/dL (ref 0.0–1.2)
Total Protein: 6.5 g/dL (ref 6.5–8.1)

## 2023-12-22 LAB — MAGNESIUM: Magnesium: 1.9 mg/dL (ref 1.7–2.4)

## 2023-12-22 MED ORDER — POTASSIUM CHLORIDE CRYS ER 20 MEQ PO TBCR
40.0000 meq | EXTENDED_RELEASE_TABLET | ORAL | Status: AC
  Administered 2023-12-22 (×2): 40 meq via ORAL
  Filled 2023-12-22 (×2): qty 2

## 2023-12-22 MED ORDER — ORAL CARE MOUTH RINSE: 15.0000 mL | OROMUCOSAL | Status: DC | PRN

## 2023-12-22 MED ORDER — GERHARDT'S BUTT CREAM
1.0000 | TOPICAL_CREAM | Freq: Three times a day (TID) | CUTANEOUS | Status: DC
Start: 2023-12-22 — End: 2023-12-29
  Administered 2023-12-22 – 2023-12-29 (×18): 1 via TOPICAL
  Filled 2023-12-22 (×3): qty 60

## 2023-12-22 MED ORDER — DIPHENHYDRAMINE HCL 25 MG PO CAPS
25.0000 mg | ORAL_CAPSULE | Freq: Four times a day (QID) | ORAL | Status: DC | PRN
  Administered 2023-12-23 – 2023-12-28 (×4): 25 mg via ORAL
  Filled 2023-12-22 (×4): qty 1

## 2023-12-22 MED ORDER — HYDROXYZINE HCL 25 MG PO TABS
25.0000 mg | ORAL_TABLET | Freq: Three times a day (TID) | ORAL | Status: DC | PRN
  Administered 2023-12-24 – 2023-12-25 (×3): 25 mg via ORAL
  Filled 2023-12-22 (×3): qty 1

## 2023-12-22 MED ORDER — CALCIUM GLUCONATE-NACL 2-0.675 GM/100ML-% IV SOLN
2.0000 g | Freq: Once | INTRAVENOUS | Status: AC
  Administered 2023-12-22: 2000 mg via INTRAVENOUS
  Filled 2023-12-22: qty 100

## 2023-12-22 MED ORDER — SODIUM CHLORIDE 0.9 % IV BOLUS
1000.0000 mL | Freq: Once | INTRAVENOUS | Status: AC
  Administered 2023-12-22: 1000 mL via INTRAVENOUS

## 2023-12-22 MED ORDER — GERHARDT'S BUTT CREAM
TOPICAL_CREAM | Freq: Two times a day (BID) | CUTANEOUS | Status: DC
  Filled 2023-12-22: qty 60

## 2023-12-22 NOTE — Assessment & Plan Note (Signed)
 Replete as needed

## 2023-12-22 NOTE — Assessment & Plan Note (Addendum)
-   Febrile, tachycardia, leukocytosis.  Presumed cellulitis of right BKA -Started on antibiotics on admission.  Will de-escalate as able -Continue wound care recommendations to lower extremities

## 2023-12-22 NOTE — Plan of Care (Signed)

## 2023-12-22 NOTE — Consult Note (Addendum)
 WOC Nurse Consult Note: patient is well known to [facility] team from previous admissions with significant moisture associated skin damage; per notes patient covered in urine and feces (compare to photos from 12/05/2023 with similar presentation)  Reason for Consult: wounds  Wound type: 1. Moisture Associated Skin Damage buttocks/posterior thighs extending onto B hips  2.  Full thickness R stump post amputation 08/2023  Pressure Injury POA: NA, moisture related  Measurement: widespread to buttocks/posterior thighs and onto hips  Wound bed: red moist with some yellow necrotic tissue; stump appears dry  Drainage (amount, consistency, odor) see nursing flowsheet; maggots present per bedside nurse  Periwound: sloughing skin  Dressing procedure/placement/frequency: Cleanse buttocks/posterior thighs/hips and back with Vashe wound cleanser, do not rinse and allow to air dry. Apply Vashe moistened gauze to wounds and allow to soak for 3-5 minutes then apply Gerhardt's Butt Cream 3 times a day and prn soiling.  Cover with ABD pads and tape or silicone foam whichever is preferred.  Cleanse R stump with Vashe, apply silver hydrofiber ([place] [phone]) to incision line daily and secure with silicone foam or Kerlix roll gauze whichever is preferred.   No open wounds noted to back/shoulders; bedside RN states itching and peeling skin.  Can apply Gerhardt's to this area as well.    POC discussed with bedside nurse. WOC team will not follow. Re-consult if further needs arise.   Thank you,    Powell Bar MSN, RN-BC, [employer]

## 2023-12-22 NOTE — Assessment & Plan Note (Addendum)
-   CIWA scores down trended.  Okay to discontinue

## 2023-12-22 NOTE — Assessment & Plan Note (Signed)
 CT right femur showed no acute abnormalities and diffuse edema throughout subcutaneous soft tissues. CT left femur showed chronic osteomyelitis and mild subcutaneous edema of the distal femur with no soft tissue gas or fluid collections.

## 2023-12-22 NOTE — Assessment & Plan Note (Signed)
-   Not on anticoagulation - Continue Lopressor

## 2023-12-22 NOTE — Assessment & Plan Note (Addendum)
-   will need to ensure meds filled prior to discharge - may try to see if he wants to go to rehab this time if indicated and/or accepted again - TOC consulted  - PT/OT consulted  - He is now amenable to going to rehab at discharge

## 2023-12-22 NOTE — Assessment & Plan Note (Signed)
-   Found to be in A-fib with RVR on admission; likely had chest pain related to this - trop trend relatively flat and tachy mediated - continue rate control

## 2023-12-22 NOTE — Hospital Course (Signed)
 Jake Nguyen is a 58 year old male with PMH homelessness, alcohol  abuse, depression/anxiety, chronic osteomyelitis, left AKA 2022, right BKA May 2025, A-fib. He presented with chest pain and bilateral leg pain.  Patient was discharged from the hospital on 12/12/2023 after treatment for acute on chronic hyponatremia, rapid atrial fibrillation, and lactic acidosis.  It was recommended that he go to an SNF on discharge but the patient refused.  He was picked up by EMS today the same paper scrubs he was discharged in over a week ago which were said to be soiled in urine and feces and adherent to his skin.   Patient notes that he has been having fleeting episodes of sharp chest pain accompanied by sensation that his heart is racing.  He has also been experiencing increased pain in his bilateral lower extremities.   ED Course: Upon arrival to the ED, patient is found to be febrile to 38.3 C and saturating well on room air with elevated heart rate and SBP in the 90-110 range.  Labs are most notable for sodium 126, WBC 12,200, platelets 483,000, normal lactic acid, and troponin 24.   Blood cultures were collected in the ED and the patient was given 3 L NS, acetaminophen , Dilaudid , cefepime , vancomycin , and Flagyl .  CT right femur showed no acute abnormalities and diffuse edema throughout subcutaneous soft tissues. CT left femur showed chronic osteomyelitis and mild subcutaneous edema of the distal femur with no soft tissue gas or fluid collections.

## 2023-12-22 NOTE — Assessment & Plan Note (Signed)
-   Suspected combination on admission of beer potomania and/or from dehydration/volume depletion given history of homelessness - s/p IVF - continue diet - BMP daily

## 2023-12-22 NOTE — Plan of Care (Signed)
  Problem: Clinical Measurements: Goal: Diagnostic test results will improve Outcome: Not Progressing Goal: Signs and symptoms of infection will decrease Outcome: Not Progressing   Problem: Respiratory: Goal: Ability to maintain adequate ventilation will improve Outcome: Not Progressing   Problem: Clinical Measurements: Goal: Will remain free from infection Outcome: Not Progressing   Problem: Activity: Goal: Risk for activity intolerance will decrease Outcome: Not Progressing   Problem: Coping: Goal: Level of anxiety will decrease Outcome: Not Progressing   Problem: Elimination: Goal: Will not experience complications related to bowel motility Outcome: Not Progressing Goal: Will not experience complications related to urinary retention Outcome: Not Progressing   Problem: Pain Managment: Goal: General experience of comfort will improve and/or be controlled Outcome: Not Progressing

## 2023-12-22 NOTE — Progress Notes (Signed)
 Progress Note    Jake Nguyen   FMW:[id number]  DOB: June 01, 1965  DOA: 12/21/2023     1 PCP: Pcp, No  Initial CC: leg infection  Hospital Course: Jake Nguyen is a 58 year old male with PMH homelessness, alcohol  abuse, depression/anxiety, chronic osteomyelitis, left AKA 2022, right BKA May 2025, A-fib. He presented with chest pain and bilateral leg pain.  Patient was discharged from the hospital on 12/12/2023 after treatment for acute on chronic hyponatremia, rapid atrial fibrillation, and lactic acidosis.  It was recommended that he go to an SNF on discharge but the patient refused.  He was picked up by EMS today the same paper scrubs he was discharged in over a week ago which were said to be soiled in urine and feces and adherent to his skin.   Patient notes that he has been having fleeting episodes of sharp chest pain accompanied by sensation that his heart is racing.  He has also been experiencing increased pain in his bilateral lower extremities.   ED Course: Upon arrival to the ED, patient is found to be febrile to 38.3 C and saturating well on room air with elevated heart rate and SBP in the 90-110 range.  Labs are most notable for sodium 126, WBC 12,200, platelets 483,000, normal lactic acid, and troponin 24.   Blood cultures were collected in the ED and the patient was given 3 L NS, acetaminophen , Dilaudid , cefepime , vancomycin , and Flagyl .  CT right femur showed no acute abnormalities and diffuse edema throughout subcutaneous soft tissues. CT left femur showed chronic osteomyelitis and mild subcutaneous edema of the distal femur with no soft tissue gas or fluid collections.  Interval History:  Resting in bed sleeping heavily but awakens some.  Complains of pain in his left stump  Assessment and Plan: * Sepsis due to cellulitis (HCC) - Febrile, tachycardia, leukocytosis.  Presumed cellulitis of right BKA -Started on antibiotics on admission.  Will de-escalate as  able -Continue wound care recommendations to lower extremities  PAF (paroxysmal atrial fibrillation) (HCC) - Not on anticoagulation - Continue Lopressor   Chronic osteomyelitis (HCC) CT right femur showed no acute abnormalities and diffuse edema throughout subcutaneous soft tissues. CT left femur showed chronic osteomyelitis and mild subcutaneous edema of the distal femur with no soft tissue gas or fluid collections.  Homelessness - will need to ensure meds filled prior to discharge - may try to see if he wants to go to rehab this time if indicated and/or accepted again  Alcohol  use disorder, severe, dependence (HCC) - Continue CIWA protocol  Hypokalemia - Replete as needed  Hyponatremia - Suspected combination on admission of beer potomania and/or from dehydration/volume depletion given history of homelessness - s/p IVF - continue diet - BMP daily  Chest pain-resolved as of 12/22/2023 - Found to be in A-fib with RVR on admission; likely had chest pain related to this - trop trend relatively flat and tachy mediated - continue rate control        Old records reviewed in assessment of this patient  Antimicrobials: Cefepime  12/21/2023 >> current Flagyl  12/21/2023 >> current Vancomycin  12/21/2023 >> current  DVT prophylaxis:  enoxaparin  (LOVENOX ) injection 40 mg Start: 12/21/23 2200   Code Status:   Code Status: Full Code  Mobility Assessment (Last 72 Hours)     Mobility Assessment     Row Name 12/22/23 0900 12/21/23 2200         Does the patient have exclusion criteria? No - Perform mobility assessment No -  Perform mobility assessment      Mobility Assessment Exclusion Criteria No exclusion criteria present, perform mobility assessment No exclusion criteria present, perform mobility assessment      What is the highest level of mobility based on the mobility assessment? Level 1 (Bedfast) - Unable to balance while sitting on edge of bed Level 2 (Chairfast) - Balance  while sitting on edge of bed and cannot stand      Is the above level different from baseline mobility prior to current illness? Yes - Recommend PT order Yes - Recommend PT order         Barriers to discharge: homeless Disposition Plan:  TBD HH orders placed: n/a Status is: Inpt  Objective: Blood pressure 107/68, pulse (!) 109, temperature 99.6 F (37.6 C), temperature source Oral, resp. rate (!) 22, height 5' 10 (1.778 m), weight 63.1 kg, SpO2 100%.  Examination:  Physical Exam Constitutional:      General: He is not in acute distress.    Appearance: Normal appearance.     Comments: Malodorous and very disheveled appearing adult man lying in bed in no distress  HENT:     Head: Normocephalic and atraumatic.     Mouth/Throat:     Mouth: Mucous membranes are dry.  Eyes:     Extraocular Movements: Extraocular movements intact.  Cardiovascular:     Rate and Rhythm: Normal rate. Rhythm irregular.  Pulmonary:     Effort: Pulmonary effort is normal. No respiratory distress.     Breath sounds: Normal breath sounds. No wheezing.  Abdominal:     General: Bowel sounds are normal. There is no distension.     Palpations: Abdomen is soft.     Tenderness: There is no abdominal tenderness.  Musculoskeletal:     Cervical back: Normal range of motion and neck supple.     Comments: Abrasions to stump noted on right BKA; flaking and scaling skin; TTP  Skin:    General: Skin is warm and dry.  Neurological:     General: No focal deficit present.  Psychiatric:        Mood and Affect: Mood normal.        Behavior: Behavior normal.    Pic taken 9/12      Consultants:    Procedures:    Data Reviewed: Results for orders placed or performed during the hospital encounter of 12/21/23 (from the past 24 hours)  Culture, blood (routine x 2)     Status: None (Preliminary result)   Collection Time: 12/21/23  4:10 PM   Specimen: BLOOD LEFT FOREARM  Result Value Ref Range   Specimen  Description      BLOOD LEFT FOREARM Performed at [facility], [address], [place], [place] [address]    Special Requests      BOTTLES DRAWN AEROBIC AND ANAEROBIC Blood Culture adequate volume Performed at [facility], [address], [place], [place] [address]    Culture      NO GROWTH < 12 HOURS Performed at [facility], [address] [place], [place] [address]    Report Status PENDING   Lactic acid, plasma     Status: None   Collection Time: 12/21/23  4:23 PM  Result Value Ref Range   Lactic Acid, Venous 1.6 0.5 - 1.9 mmol/L  Culture, blood (routine x 2)     Status: None (Preliminary result)   Collection Time: 12/21/23  4:30 PM   Specimen: BLOOD RIGHT  FOREARM  Result Value Ref Range   Specimen Description      BLOOD RIGHT FOREARM Performed at [facility], [address], [place], [place] [address]    Special Requests      BOTTLES DRAWN AEROBIC AND ANAEROBIC Blood Culture adequate volume Performed at [facility], [address] [place], [place] [address]    Culture      NO GROWTH < 12 HOURS Performed at [facility], [address], [place], [place] [address]    Report Status PENDING   Basic metabolic panel     Status: Abnormal   Collection Time: 12/21/23  4:31 PM  Result Value Ref Range   Sodium 126 (L) 135 - 145 mmol/L   Potassium 3.8 3.5 - 5.1 mmol/L   Chloride 92 (L) 98 - 111 mmol/L   CO2 20 (L) 22 - 32 mmol/L   Glucose, Bld 107 (H) 70 - 99 mg/dL   BUN <5 (L) 6 - 20 mg/dL   Creatinine, Ser 9.60 (L) 0.61 - 1.24 mg/dL   Calcium  7.4 (L) 8.9 - 10.3 mg/dL   GFR, Estimated >39 >39 mL/min   Anion gap 14 5 - 15  CBC     Status: Abnormal   Collection Time: 12/21/23  4:31 PM  Result Value Ref Range   WBC 12.2 (H) 4.0 - 10.5 K/uL   RBC 3.50 (L) 4.22 - 5.81 MIL/uL   Hemoglobin 9.5 (L) 13.0 - 17.0 g/dL   HCT 71.1 (L) 60.9 - 47.9 %   MCV 82.3 80.0 - 100.0 fL   MCH 27.1 26.0 - 34.0 pg   MCHC 33.0  30.0 - 36.0 g/dL   RDW 84.5 88.4 - 84.4 %   Platelets 483 (H) 150 - 400 K/uL   nRBC 0.0 0.0 - 0.2 %  Troponin T, High Sensitivity     Status: Abnormal   Collection Time: 12/21/23  4:31 PM  Result Value Ref Range   Troponin T High Sensitivity 24 (H) 0 - 19 ng/L  Urine rapid drug screen (hosp performed)     Status: Abnormal   Collection Time: 12/21/23  4:31 PM  Result Value Ref Range   Opiates NEGATIVE NEGATIVE   Cocaine NEGATIVE NEGATIVE   Benzodiazepines NEGATIVE NEGATIVE   Amphetamines NEGATIVE NEGATIVE   Tetrahydrocannabinol POSITIVE (A) NEGATIVE   Barbiturates NEGATIVE NEGATIVE   Methadone Scn, Ur NEGATIVE NEGATIVE   Fentanyl  NEGATIVE NEGATIVE  Magnesium      Status: None   Collection Time: 12/21/23  4:31 PM  Result Value Ref Range   Magnesium  2.0 1.7 - 2.4 mg/dL  Ethanol     Status: Abnormal   Collection Time: 12/21/23  4:32 PM  Result Value Ref Range   Alcohol , Ethyl (B) 151 (H) <15 mg/dL  Lactic acid, plasma     Status: None   Collection Time: 12/21/23  7:17 PM  Result Value Ref Range   Lactic Acid, Venous 1.1 0.5 - 1.9 mmol/L  Troponin T, High Sensitivity     Status: Abnormal   Collection Time: 12/21/23  7:17 PM  Result Value Ref Range   Troponin T High Sensitivity 31 (H) 0 - 19 ng/L  Resp panel by RT-PCR (RSV, Flu A&B, Covid) Anterior Nasal Swab     Status: None   Collection Time: 12/21/23  7:17 PM   Specimen: Anterior Nasal Swab  Result Value Ref Range   SARS Coronavirus 2 by RT PCR NEGATIVE NEGATIVE   Influenza A by  PCR NEGATIVE NEGATIVE   Influenza B by PCR NEGATIVE NEGATIVE   Resp Syncytial Virus by PCR NEGATIVE NEGATIVE  MRSA Next Gen by PCR, Nasal     Status: None   Collection Time: 12/21/23  9:42 PM   Specimen: Nasal Mucosa; Nasal Swab  Result Value Ref Range   MRSA by PCR Next Gen NOT DETECTED NOT DETECTED  CBC     Status: Abnormal   Collection Time: 12/22/23  6:29 AM  Result Value Ref Range   WBC 8.6 4.0 - 10.5 K/uL   RBC 4.22 4.22 - 5.81 MIL/uL    Hemoglobin 11.2 (L) 13.0 - 17.0 g/dL   HCT 64.2 (L) 60.9 - 47.9 %   MCV 84.6 80.0 - 100.0 fL   MCH 26.5 26.0 - 34.0 pg   MCHC 31.4 30.0 - 36.0 g/dL   RDW 84.3 (H) 88.4 - 84.4 %   Platelets 559 (H) 150 - 400 K/uL   nRBC 0.0 0.0 - 0.2 %  Basic metabolic panel     Status: Abnormal   Collection Time: 12/22/23  6:29 AM  Result Value Ref Range   Sodium 134 (L) 135 - 145 mmol/L   Potassium 3.0 (L) 3.5 - 5.1 mmol/L   Chloride 100 98 - 111 mmol/L   CO2 22 22 - 32 mmol/L   Glucose, Bld 111 (H) 70 - 99 mg/dL   BUN <5 (L) 6 - 20 mg/dL   Creatinine, Ser 9.55 (L) 0.61 - 1.24 mg/dL   Calcium  7.3 (L) 8.9 - 10.3 mg/dL   GFR, Estimated >39 >39 mL/min   Anion gap 12 5 - 15  Hepatic function panel     Status: Abnormal   Collection Time: 12/22/23  6:29 AM  Result Value Ref Range   Total Protein 6.5 6.5 - 8.1 g/dL   Albumin  2.5 (L) 3.5 - 5.0 g/dL   AST 35 15 - 41 U/L   ALT 14 0 - 44 U/L   Alkaline Phosphatase 75 38 - 126 U/L   Total Bilirubin 0.4 0.0 - 1.2 mg/dL   Bilirubin, Direct 0.2 0.0 - 0.2 mg/dL   Indirect Bilirubin 0.2 (L) 0.3 - 0.9 mg/dL  Magnesium      Status: None   Collection Time: 12/22/23  6:29 AM  Result Value Ref Range   Magnesium  1.9 1.7 - 2.4 mg/dL    I have reviewed pertinent nursing notes, vitals, labs, and images as necessary. I have ordered labwork to follow up on as indicated.  I have reviewed the last notes from staff over past 24 hours. I have discussed patient's care plan and test results with nursing staff, CM/SW, and other staff as appropriate.  Time spent: Greater than 50% of the 55 minute visit was spent in counseling/coordination of care for the patient as laid out in the A&P.   LOS: 1 day   Alm Apo, MD [facility] 12/22/2023, 1:23 PM

## 2023-12-23 ENCOUNTER — Other Ambulatory Visit: Payer: Self-pay

## 2023-12-23 DIAGNOSIS — A419 Sepsis, unspecified organism: Secondary | ICD-10-CM | POA: Diagnosis not present

## 2023-12-23 DIAGNOSIS — L0390 Cellulitis, unspecified: Secondary | ICD-10-CM | POA: Diagnosis not present

## 2023-12-23 DIAGNOSIS — Z515 Encounter for palliative care: Secondary | ICD-10-CM | POA: Insufficient documentation

## 2023-12-23 DIAGNOSIS — I959 Hypotension, unspecified: Secondary | ICD-10-CM | POA: Diagnosis not present

## 2023-12-23 DIAGNOSIS — M8660 Other chronic osteomyelitis, unspecified site: Secondary | ICD-10-CM | POA: Diagnosis not present

## 2023-12-23 LAB — CBC WITH DIFFERENTIAL/PLATELET
Abs Immature Granulocytes: 0.05 K/uL (ref 0.00–0.07)
Basophils Absolute: 0.1 K/uL (ref 0.0–0.1)
Basophils Relative: 1 %
Eosinophils Absolute: 1.2 K/uL — ABNORMAL HIGH (ref 0.0–0.5)
Eosinophils Relative: 13 %
HCT: 30.4 % — ABNORMAL LOW (ref 39.0–52.0)
Hemoglobin: 9.5 g/dL — ABNORMAL LOW (ref 13.0–17.0)
Immature Granulocytes: 1 %
Lymphocytes Relative: 34 %
Lymphs Abs: 3.2 K/uL (ref 0.7–4.0)
MCH: 26.9 pg (ref 26.0–34.0)
MCHC: 31.3 g/dL (ref 30.0–36.0)
MCV: 86.1 fL (ref 80.0–100.0)
Monocytes Absolute: 1.7 K/uL — ABNORMAL HIGH (ref 0.1–1.0)
Monocytes Relative: 19 %
Neutro Abs: 2.9 K/uL (ref 1.7–7.7)
Neutrophils Relative %: 32 %
Platelets: 399 K/uL (ref 150–400)
RBC: 3.53 MIL/uL — ABNORMAL LOW (ref 4.22–5.81)
RDW: 15.8 % — ABNORMAL HIGH (ref 11.5–15.5)
WBC: 9.1 K/uL (ref 4.0–10.5)
nRBC: 0 % (ref 0.0–0.2)

## 2023-12-23 LAB — BASIC METABOLIC PANEL WITH GFR
Anion gap: 11 (ref 5–15)
BUN: <5 mg/dL — ABNORMAL LOW (ref 6–20)
CO2: 21 mmol/L — ABNORMAL LOW (ref 22–32)
Calcium: 7.4 mg/dL — ABNORMAL LOW (ref 8.9–10.3)
Chloride: 101 mmol/L (ref 98–111)
Creatinine, Ser: 0.45 mg/dL — ABNORMAL LOW (ref 0.61–1.24)
GFR, Estimated: >60 mL/min (ref >60)
Glucose, Bld: 122 mg/dL — ABNORMAL HIGH (ref 70–99)
Potassium: 3.4 mmol/L — ABNORMAL LOW (ref 3.5–5.1)
Sodium: 134 mmol/L — ABNORMAL LOW (ref 135–145)

## 2023-12-23 LAB — MAGNESIUM: Magnesium: 1.9 mg/dL (ref 1.7–2.4)

## 2023-12-23 MED ORDER — SODIUM CHLORIDE 0.9 % IV BOLUS
1000.0000 mL | Freq: Once | INTRAVENOUS | Status: AC
  Administered 2023-12-23: 1000 mL via INTRAVENOUS

## 2023-12-23 MED ORDER — ALBUMIN HUMAN 25 % IV SOLN
25.0000 g | Freq: Four times a day (QID) | INTRAVENOUS | Status: DC
  Administered 2023-12-23: 12.5 g via INTRAVENOUS
  Administered 2023-12-24 (×2): 25 g via INTRAVENOUS
  Administered 2023-12-24 (×2): 12.5 g via INTRAVENOUS
  Administered 2023-12-24 – 2023-12-25 (×2): 25 g via INTRAVENOUS
  Filled 2023-12-23 (×7): qty 100

## 2023-12-23 MED ORDER — METOPROLOL TARTRATE 12.5 MG HALF TABLET
12.5000 mg | ORAL_TABLET | Freq: Two times a day (BID) | ORAL | Status: DC
Start: 2023-12-23 — End: 2023-12-24
  Administered 2023-12-23 – 2023-12-24 (×2): 12.5 mg via ORAL
  Filled 2023-12-23 (×2): qty 1

## 2023-12-23 MED ORDER — METOPROLOL TARTRATE 5 MG/5ML IV SOLN
5.0000 mg | INTRAVENOUS | Status: DC | PRN
  Administered 2023-12-23 – 2023-12-25 (×2): 5 mg via INTRAVENOUS
  Filled 2023-12-23 (×2): qty 5

## 2023-12-23 MED ORDER — SODIUM CHLORIDE 0.9 % IV SOLN: INTRAVENOUS | Status: DC

## 2023-12-23 NOTE — Progress Notes (Signed)
 Progress Note    Jake Nguyen   FMW:[id number]  DOB: 19-Jan-1966  DOA: 12/21/2023     2 PCP: Pcp, No  Initial CC: leg infection  Hospital Course: Jake Nguyen is a 58 year old male with PMH homelessness, alcohol  abuse, depression/anxiety, chronic osteomyelitis, left AKA 2022, right BKA May 2025, A-fib. He presented with chest pain and bilateral leg pain.  Patient was discharged from the hospital on 12/12/2023 after treatment for acute on chronic hyponatremia, rapid atrial fibrillation, and lactic acidosis.  It was recommended that he go to an SNF on discharge but the patient refused.  He was picked up by EMS today the same paper scrubs he was discharged in over a week ago which were said to be soiled in urine and feces and adherent to his skin.   Patient notes that he has been having fleeting episodes of sharp chest pain accompanied by sensation that his heart is racing.  He has also been experiencing increased pain in his bilateral lower extremities.   ED Course: Upon arrival to the ED, patient is found to be febrile to 38.3 C and saturating well on room air with elevated heart rate and SBP in the 90-110 range.  Labs are most notable for sodium 126, WBC 12,200, platelets 483,000, normal lactic acid, and troponin 24.   Blood cultures were collected in the ED and the patient was given 3 L NS, acetaminophen , Dilaudid , cefepime , vancomycin , and Flagyl .  CT right femur showed no acute abnormalities and diffuse edema throughout subcutaneous soft tissues. CT left femur showed chronic osteomyelitis and mild subcutaneous edema of the distal femur with no soft tissue gas or fluid collections.  Interval History:  Awake and alert resting in bed comfortably this morning.  Main concern continues to just be pain in the right stump otherwise doing okay.  Eating okay also. Still having hypotension throughout the day.  Assessment and Plan: * Sepsis due to cellulitis (HCC) - Febrile, tachycardia,  leukocytosis.  Presumed cellulitis of right BKA -Started on antibiotics on admission.  Will de-escalate as able -Continue wound care recommendations to lower extremities  PAF (paroxysmal atrial fibrillation) (HCC) - Not on anticoagulation - Continue Lopressor   Chronic osteomyelitis (HCC) CT right femur showed no acute abnormalities and diffuse edema throughout subcutaneous soft tissues. CT left femur showed chronic osteomyelitis and mild subcutaneous edema of the distal femur with no soft tissue gas or fluid collections.  Hypotension - Does not appear to be worsening sepsis however continues to have recurrent hypotension -Will continue fluid bolus and place on maintenance fluids; if refractory still, will check lactic - Start on albumin   Homelessness - will need to ensure meds filled prior to discharge - may try to see if he wants to go to rehab this time if indicated and/or accepted again  Alcohol  use disorder, severe, dependence (HCC) - Continue CIWA protocol  Hypokalemia - Replete as needed  Hyponatremia - Suspected combination on admission of beer potomania and/or from dehydration/volume depletion given history of homelessness - s/p IVF - continue diet - BMP daily  Chest pain-resolved as of 12/22/2023 - Found to be in A-fib with RVR on admission; likely had chest pain related to this - trop trend relatively flat and tachy mediated - continue rate control     Old records reviewed in assessment of this patient  Antimicrobials: Cefepime  12/21/2023 >> current Flagyl  12/21/2023 >> x 1 Vancomycin  12/21/2023 >> current  DVT prophylaxis:  enoxaparin  (LOVENOX ) injection 40 mg Start: 12/21/23 2200  Code Status:   Code Status: Full Code  Mobility Assessment (Last 72 Hours)     Mobility Assessment     Row Name 12/23/23 0804 12/23/23 0800 12/22/23 2000 12/22/23 0900 12/21/23 2200   Does the patient have exclusion criteria? Yes - Bedfast (Level 1) - Select exclusion  criteria in next row -- No - Perform mobility assessment No - Perform mobility assessment No - Perform mobility assessment   Mobility Assessment Exclusion Criteria Order to exclude patient from protocol -- No exclusion criteria present, perform mobility assessment No exclusion criteria present, perform mobility assessment No exclusion criteria present, perform mobility assessment   What is the highest level of mobility based on the mobility assessment? -- -- Level 1 (Bedfast) - Unable to balance while sitting on edge of bed Level 1 (Bedfast) - Unable to balance while sitting on edge of bed Level 2 (Chairfast) - Balance while sitting on edge of bed and cannot stand   Is the above level different from baseline mobility prior to current illness? -- No - Consider discontinuing PT/OT No - Consider discontinuing PT/OT Yes - Recommend PT order Yes - Recommend PT order      Barriers to discharge: homeless Disposition Plan:  TBD HH orders placed: n/a Status is: Inpt  Objective: Blood pressure (!) 81/42, pulse (!) 137, temperature 98.9 F (37.2 C), temperature source Axillary, resp. rate (!) 21, height 5' 10 (1.778 m), weight 63.1 kg, SpO2 97%.  Examination:  Physical Exam Constitutional:      General: He is not in acute distress.    Appearance: Normal appearance.     Comments: Malodorous and very disheveled appearing adult man lying in bed in no distress  HENT:     Head: Normocephalic and atraumatic.     Mouth/Throat:     Mouth: Mucous membranes are dry.  Eyes:     Extraocular Movements: Extraocular movements intact.  Cardiovascular:     Rate and Rhythm: Tachycardia present. Rhythm irregular.  Pulmonary:     Effort: Pulmonary effort is normal. No respiratory distress.     Breath sounds: Normal breath sounds. No wheezing.  Abdominal:     General: Bowel sounds are normal. There is no distension.     Palpations: Abdomen is soft.     Tenderness: There is no abdominal tenderness.   Musculoskeletal:     Cervical back: Normal range of motion and neck supple.     Comments: Abrasions to stump noted on right BKA; flaking and scaling skin; TTP  Skin:    General: Skin is warm and dry.  Neurological:     General: No focal deficit present.  Psychiatric:        Mood and Affect: Mood normal.        Behavior: Behavior normal.    Pic taken 9/12      Consultants:    Procedures:    Data Reviewed: Results for orders placed or performed during the hospital encounter of 12/21/23 (from the past 24 hours)  CBC with Differential/Platelet     Status: Abnormal   Collection Time: 12/23/23  2:53 AM  Result Value Ref Range   WBC 9.1 4.0 - 10.5 K/uL   RBC 3.53 (L) 4.22 - 5.81 MIL/uL   Hemoglobin 9.5 (L) 13.0 - 17.0 g/dL   HCT 69.5 (L) 60.9 - 47.9 %   MCV 86.1 80.0 - 100.0 fL   MCH 26.9 26.0 - 34.0 pg   MCHC 31.3 30.0 - 36.0 g/dL   RDW 84.1 (  H) 11.5 - 15.5 %   Platelets 399 150 - 400 K/uL   nRBC 0.0 0.0 - 0.2 %   Neutrophils Relative % 32 %   Neutro Abs 2.9 1.7 - 7.7 K/uL   Lymphocytes Relative 34 %   Lymphs Abs 3.2 0.7 - 4.0 K/uL   Monocytes Relative 19 %   Monocytes Absolute 1.7 (H) 0.1 - 1.0 K/uL   Eosinophils Relative 13 %   Eosinophils Absolute 1.2 (H) 0.0 - 0.5 K/uL   Basophils Relative 1 %   Basophils Absolute 0.1 0.0 - 0.1 K/uL   Immature Granulocytes 1 %   Abs Immature Granulocytes 0.05 0.00 - 0.07 K/uL  Magnesium      Status: None   Collection Time: 12/23/23  2:53 AM  Result Value Ref Range   Magnesium  1.9 1.7 - 2.4 mg/dL  Basic metabolic panel with GFR     Status: Abnormal   Collection Time: 12/23/23  2:53 AM  Result Value Ref Range   Sodium 134 (L) 135 - 145 mmol/L   Potassium 3.4 (L) 3.5 - 5.1 mmol/L   Chloride 101 98 - 111 mmol/L   CO2 21 (L) 22 - 32 mmol/L   Glucose, Bld 122 (H) 70 - 99 mg/dL   BUN <5 (L) 6 - 20 mg/dL   Creatinine, Ser 9.54 (L) 0.61 - 1.24 mg/dL   Calcium  7.4 (L) 8.9 - 10.3 mg/dL   GFR, Estimated >39 >39 mL/min   Anion gap  11 5 - 15    I have reviewed pertinent nursing notes, vitals, labs, and images as necessary. I have ordered labwork to follow up on as indicated.  I have reviewed the last notes from staff over past 24 hours. I have discussed patient's care plan and test results with nursing staff, CM/SW, and other staff as appropriate.  Time spent: Greater than 50% of the 55 minute visit was spent in counseling/coordination of care for the patient as laid out in the A&P.   LOS: 2 days   Alm Apo, MD [facility] 12/23/2023, 4:49 PM

## 2023-12-23 NOTE — Assessment & Plan Note (Addendum)
-   Does not appear to be worsening sepsis however continues to have recurrent hypotension - Responds to fluid boluses - Blood pressure more stable now.  Okay to discontinue maintenance fluids and albumin

## 2023-12-23 NOTE — Plan of Care (Signed)
  Daily Progress Note   Patient Name: Jake Nguyen       Date: 12/23/2023 DOB: July 02, 1965  Age: 58 y.o. MRN#: [id number] Attending Physician: Patsy Lenis, MD Primary Care Physician: Pcp, No Admit Date: 12/21/2023 Length of Stay: 2 days  Discussed care with primary hospitalist today. Hospitalist noting no acute PMT needs at this time so consult will be canceled. Please place new consult if acute PMT needs arise. Thank you.   Tinnie Radar, DO Palliative Care Provider PMT # [phone]  No Charge Note

## 2023-12-23 NOTE — Plan of Care (Signed)

## 2023-12-24 DIAGNOSIS — M8660 Other chronic osteomyelitis, unspecified site: Secondary | ICD-10-CM | POA: Diagnosis not present

## 2023-12-24 DIAGNOSIS — L0390 Cellulitis, unspecified: Secondary | ICD-10-CM | POA: Diagnosis not present

## 2023-12-24 DIAGNOSIS — A419 Sepsis, unspecified organism: Secondary | ICD-10-CM | POA: Diagnosis not present

## 2023-12-24 DIAGNOSIS — I959 Hypotension, unspecified: Secondary | ICD-10-CM | POA: Diagnosis not present

## 2023-12-24 DIAGNOSIS — Z5900 Homelessness unspecified: Secondary | ICD-10-CM

## 2023-12-24 LAB — MAGNESIUM: Magnesium: 1.8 mg/dL (ref 1.7–2.4)

## 2023-12-24 LAB — BASIC METABOLIC PANEL WITH GFR
Anion gap: 11 (ref 5–15)
BUN: <5 mg/dL — ABNORMAL LOW (ref 6–20)
CO2: 24 mmol/L (ref 22–32)
Calcium: 7.6 mg/dL — ABNORMAL LOW (ref 8.9–10.3)
Chloride: 102 mmol/L (ref 98–111)
Creatinine, Ser: 0.38 mg/dL — ABNORMAL LOW (ref 0.61–1.24)
GFR, Estimated: >60 mL/min (ref >60)
Glucose, Bld: 138 mg/dL — ABNORMAL HIGH (ref 70–99)
Potassium: 2.8 mmol/L — ABNORMAL LOW (ref 3.5–5.1)
Sodium: 138 mmol/L (ref 135–145)

## 2023-12-24 LAB — CBC WITH DIFFERENTIAL/PLATELET
Abs Immature Granulocytes: 0.04 K/uL (ref 0.00–0.07)
Basophils Absolute: 0 K/uL (ref 0.0–0.1)
Basophils Relative: 0 %
Eosinophils Absolute: 1.2 K/uL — ABNORMAL HIGH (ref 0.0–0.5)
Eosinophils Relative: 14 %
HCT: 26.6 % — ABNORMAL LOW (ref 39.0–52.0)
Hemoglobin: 8.4 g/dL — ABNORMAL LOW (ref 13.0–17.0)
Immature Granulocytes: 1 %
Lymphocytes Relative: 27 %
Lymphs Abs: 2.3 K/uL (ref 0.7–4.0)
MCH: 26.8 pg (ref 26.0–34.0)
MCHC: 31.6 g/dL (ref 30.0–36.0)
MCV: 84.7 fL (ref 80.0–100.0)
Monocytes Absolute: 1.3 K/uL — ABNORMAL HIGH (ref 0.1–1.0)
Monocytes Relative: 15 %
Neutro Abs: 3.8 K/uL (ref 1.7–7.7)
Neutrophils Relative %: 43 %
Platelets: 336 K/uL (ref 150–400)
RBC: 3.14 MIL/uL — ABNORMAL LOW (ref 4.22–5.81)
RDW: 15.7 % — ABNORMAL HIGH (ref 11.5–15.5)
WBC: 8.6 K/uL (ref 4.0–10.5)
nRBC: 0 % (ref 0.0–0.2)

## 2023-12-24 MED ORDER — MAGNESIUM SULFATE 2 GM/50ML IV SOLN
2.0000 g | Freq: Once | INTRAVENOUS | Status: AC
  Administered 2023-12-24: 2 g via INTRAVENOUS
  Filled 2023-12-24: qty 50

## 2023-12-24 MED ORDER — POTASSIUM CHLORIDE CRYS ER 20 MEQ PO TBCR
40.0000 meq | EXTENDED_RELEASE_TABLET | ORAL | Status: AC
  Administered 2023-12-24 (×2): 40 meq via ORAL
  Filled 2023-12-24: qty 4
  Filled 2023-12-24: qty 2

## 2023-12-24 MED ORDER — DOXYCYCLINE HYCLATE 100 MG PO TABS
100.0000 mg | ORAL_TABLET | Freq: Two times a day (BID) | ORAL | Status: DC
  Administered 2023-12-24 – 2023-12-29 (×11): 100 mg via ORAL
  Filled 2023-12-24 (×11): qty 1

## 2023-12-24 MED ORDER — HYDROCORTISONE 1 % EX CREA
TOPICAL_CREAM | Freq: Four times a day (QID) | CUTANEOUS | Status: DC
  Administered 2023-12-26: 1 via TOPICAL
  Filled 2023-12-24 (×2): qty 28

## 2023-12-24 MED ORDER — HYDROCERIN EX CREA
TOPICAL_CREAM | Freq: Two times a day (BID) | CUTANEOUS | Status: DC
  Administered 2023-12-26: 1 via TOPICAL
  Filled 2023-12-24: qty 113

## 2023-12-24 MED ORDER — METOPROLOL TARTRATE 25 MG PO TABS
25.0000 mg | ORAL_TABLET | Freq: Two times a day (BID) | ORAL | Status: DC
  Administered 2023-12-24 – 2023-12-29 (×9): 25 mg via ORAL
  Filled 2023-12-24 (×10): qty 1

## 2023-12-24 MED ORDER — POTASSIUM CHLORIDE CRYS ER 20 MEQ PO TBCR
40.0000 meq | EXTENDED_RELEASE_TABLET | Freq: Three times a day (TID) | ORAL | Status: DC
  Administered 2023-12-24: 40 meq via ORAL
  Filled 2023-12-24: qty 2

## 2023-12-24 MED ORDER — SODIUM CHLORIDE 0.9% FLUSH: 10.0000 mL | INTRAVENOUS | Status: DC | PRN

## 2023-12-24 NOTE — Progress Notes (Signed)
 Progress Note    Jake Nguyen   FMW:[id number]  DOB: 1965-09-25  DOA: 12/21/2023     3 PCP: Pcp, No  Initial CC: leg infection  Hospital Course: Jake Nguyen is a 58 year old male with PMH homelessness, alcohol  abuse, depression/anxiety, chronic osteomyelitis, left AKA 2022, right BKA May 2025, A-fib. He presented with chest pain and bilateral leg pain.  Patient was discharged from the hospital on 12/12/2023 after treatment for acute on chronic hyponatremia, rapid atrial fibrillation, and lactic acidosis.  It was recommended that he go to an SNF on discharge but the patient refused.  He was picked up by EMS today the same paper scrubs he was discharged in over a week ago which were said to be soiled in urine and feces and adherent to his skin.   Patient notes that he has been having fleeting episodes of sharp chest pain accompanied by sensation that his heart is racing.  He has also been experiencing increased pain in his bilateral lower extremities.   ED Course: Upon arrival to the ED, patient is found to be febrile to 38.3 C and saturating well on room air with elevated heart rate and SBP in the 90-110 range.  Labs are most notable for sodium 126, WBC 12,200, platelets 483,000, normal lactic acid, and troponin 24.   Blood cultures were collected in the ED and the patient was given 3 L NS, acetaminophen , Dilaudid , cefepime , vancomycin , and Flagyl .  CT right femur showed no acute abnormalities and diffuse edema throughout subcutaneous soft tissues. CT left femur showed chronic osteomyelitis and mild subcutaneous edema of the distal femur with no soft tissue gas or fluid collections.  Interval History:  No events overnight.  Remains much more comfortable each day and more awake and alert.  Eating relatively well. Still having some low blood pressures and elevated heart rates.   Assessment and Plan: * Sepsis due to cellulitis (HCC) - Febrile, tachycardia, leukocytosis.  Presumed  cellulitis of right BKA -Started on antibiotics on admission.   - Discontinue vancomycin  and cefepime ; transition to doxycycline  to complete total of 7-day course -Continue wound care recommendations to lower extremities  PAF (paroxysmal atrial fibrillation) (HCC) - Not on anticoagulation - Continue Lopressor   Chronic osteomyelitis (HCC) CT right femur showed no acute abnormalities and diffuse edema throughout subcutaneous soft tissues. CT left femur showed chronic osteomyelitis and mild subcutaneous edema of the distal femur with no soft tissue gas or fluid collections.  Hypotension - Does not appear to be worsening sepsis however continues to have recurrent hypotension - Responds to fluid boluses - Continue maintenance fluid - Started on albumin  9/13  Homelessness - will need to ensure meds filled prior to discharge - may try to see if he wants to go to rehab this time if indicated and/or accepted again - TOC consulted  - PT/OT consulted   Hyponatremia-resolved as of 12/24/2023 - Suspected combination on admission of beer potomania and/or from dehydration/volume depletion given history of homelessness - s/p IVF - continue diet - BMP daily  Alcohol  use disorder, severe, dependence (HCC) - Continue CIWA protocol  Hypokalemia - Replete as needed  Chest pain-resolved as of 12/22/2023 - Found to be in A-fib with RVR on admission; likely had chest pain related to this - trop trend relatively flat and tachy mediated - continue rate control     Old records reviewed in assessment of this patient  Antimicrobials: Cefepime  12/21/2023 >> 12/24/2023 Flagyl  12/21/2023 >> x 1 Vancomycin  12/21/2023 >> 12/24/2023 Doxycycline   12/24/2023 >> current  DVT prophylaxis:  enoxaparin  (LOVENOX ) injection 40 mg Start: 12/21/23 2200   Code Status:   Code Status: Full Code  Mobility Assessment (Last 72 Hours)     Mobility Assessment     Row Name 12/24/23 0933 12/23/23 2000 12/23/23 0804  12/23/23 0800 12/22/23 2000   Does the patient have exclusion criteria? Yes - Bedfast (Level 1) - Select exclusion criteria in next row Yes - Bedfast (Level 1) - Select exclusion criteria in next row Yes - Bedfast (Level 1) - Select exclusion criteria in next row -- No - Perform mobility assessment   Mobility Assessment Exclusion Criteria Order to exclude patient from protocol Order to exclude patient from protocol Order to exclude patient from protocol -- No exclusion criteria present, perform mobility assessment   What is the highest level of mobility based on the mobility assessment? -- Level 1 (Bedfast) - Unable to balance while sitting on edge of bed -- -- Level 1 (Bedfast) - Unable to balance while sitting on edge of bed   Is the above level different from baseline mobility prior to current illness? -- No - Consider discontinuing PT/OT -- No - Consider discontinuing PT/OT No - Consider discontinuing PT/OT    Row Name 12/22/23 0900 12/21/23 2200         Does the patient have exclusion criteria? No - Perform mobility assessment No - Perform mobility assessment      Mobility Assessment Exclusion Criteria No exclusion criteria present, perform mobility assessment No exclusion criteria present, perform mobility assessment      What is the highest level of mobility based on the mobility assessment? Level 1 (Bedfast) - Unable to balance while sitting on edge of bed Level 2 (Chairfast) - Balance while sitting on edge of bed and cannot stand      Is the above level different from baseline mobility prior to current illness? Yes - Recommend PT order Yes - Recommend PT order         Barriers to discharge: homeless Disposition Plan:  TBD HH orders placed: n/a Status is: Inpt  Objective: Blood pressure 101/64, pulse (!) 121, temperature 99.7 F (37.6 C), temperature source Axillary, resp. rate (!) 29, height 5' 10 (1.778 m), weight 63.1 kg, SpO2 97%.  Examination:  Physical Exam Constitutional:       General: He is not in acute distress.    Appearance: Normal appearance.     Comments: Malodorous and very disheveled appearing adult man lying in bed in no distress.  Cooperative and more awake/alert  HENT:     Head: Normocephalic and atraumatic.     Mouth/Throat:     Mouth: Mucous membranes are dry.  Eyes:     Extraocular Movements: Extraocular movements intact.  Cardiovascular:     Rate and Rhythm: Tachycardia present. Rhythm irregular.  Pulmonary:     Effort: Pulmonary effort is normal. No respiratory distress.     Breath sounds: Normal breath sounds. No wheezing.  Abdominal:     General: Bowel sounds are normal. There is no distension.     Palpations: Abdomen is soft.     Tenderness: There is no abdominal tenderness.  Musculoskeletal:     Cervical back: Normal range of motion and neck supple.     Comments: Abrasions to stump noted on right BKA; flaking and scaling skin; TTP  Skin:    General: Skin is warm and dry.  Neurological:     General: No focal deficit present.  Psychiatric:  Mood and Affect: Mood normal.        Behavior: Behavior normal.    Pic taken 9/12      Consultants:    Procedures:    Data Reviewed: Results for orders placed or performed during the hospital encounter of 12/21/23 (from the past 24 hours)  CBC with Differential/Platelet     Status: Abnormal   Collection Time: 12/24/23  2:44 AM  Result Value Ref Range   WBC 8.6 4.0 - 10.5 K/uL   RBC 3.14 (L) 4.22 - 5.81 MIL/uL   Hemoglobin 8.4 (L) 13.0 - 17.0 g/dL   HCT 73.3 (L) 60.9 - 47.9 %   MCV 84.7 80.0 - 100.0 fL   MCH 26.8 26.0 - 34.0 pg   MCHC 31.6 30.0 - 36.0 g/dL   RDW 84.2 (H) 88.4 - 84.4 %   Platelets 336 150 - 400 K/uL   nRBC 0.0 0.0 - 0.2 %   Neutrophils Relative % 43 %   Neutro Abs 3.8 1.7 - 7.7 K/uL   Lymphocytes Relative 27 %   Lymphs Abs 2.3 0.7 - 4.0 K/uL   Monocytes Relative 15 %   Monocytes Absolute 1.3 (H) 0.1 - 1.0 K/uL   Eosinophils Relative 14 %    Eosinophils Absolute 1.2 (H) 0.0 - 0.5 K/uL   Basophils Relative 0 %   Basophils Absolute 0.0 0.0 - 0.1 K/uL   Immature Granulocytes 1 %   Abs Immature Granulocytes 0.04 0.00 - 0.07 K/uL  Magnesium      Status: None   Collection Time: 12/24/23  2:44 AM  Result Value Ref Range   Magnesium  1.8 1.7 - 2.4 mg/dL  Basic metabolic panel with GFR     Status: Abnormal   Collection Time: 12/24/23  2:44 AM  Result Value Ref Range   Sodium 138 135 - 145 mmol/L   Potassium 2.8 (L) 3.5 - 5.1 mmol/L   Chloride 102 98 - 111 mmol/L   CO2 24 22 - 32 mmol/L   Glucose, Bld 138 (H) 70 - 99 mg/dL   BUN <5 (L) 6 - 20 mg/dL   Creatinine, Ser 9.61 (L) 0.61 - 1.24 mg/dL   Calcium  7.6 (L) 8.9 - 10.3 mg/dL   GFR, Estimated >39 >39 mL/min   Anion gap 11 5 - 15    I have reviewed pertinent nursing notes, vitals, labs, and images as necessary. I have ordered labwork to follow up on as indicated.  I have reviewed the last notes from staff over past 24 hours. I have discussed patient's care plan and test results with nursing staff, CM/SW, and other staff as appropriate.  Time spent: Greater than 50% of the 55 minute visit was spent in counseling/coordination of care for the patient as laid out in the A&P.   LOS: 3 days   Alm Apo, MD [facility] 12/24/2023, 2:30 PM

## 2023-12-24 NOTE — Plan of Care (Signed)
  Problem: Respiratory: Goal: Ability to maintain adequate ventilation will improve Outcome: Progressing   Problem: Clinical Measurements: Goal: Respiratory complications will improve Outcome: Progressing   Problem: Nutrition: Goal: Adequate nutrition will be maintained Outcome: Progressing   Problem: Elimination: Goal: Will not experience complications related to urinary retention Outcome: Progressing   Problem: Fluid Volume: Goal: Hemodynamic stability will improve Outcome: Not Progressing   Problem: Clinical Measurements: Goal: Signs and symptoms of infection will decrease Outcome: Not Progressing   Problem: Clinical Measurements: Goal: Cardiovascular complication will be avoided Outcome: Not Progressing   Problem: Pain Managment: Goal: General experience of comfort will improve and/or be controlled Outcome: Not Progressing

## 2023-12-24 NOTE — Plan of Care (Signed)
 Patient has multiple skin breakdown due to him scratching so bad he has torn the skin on his back,arms thighs, sacrum and chest, skin also so dry and flaky it is hard to keep dressings intact or in place due to adhesives will not stick, see orders for wound & skin care routine, creams and PO medications, plan of care and goals reviewed with patient, time given for questions, patient handbook at bedside, bed in lowest locked position with call bell in reach with bed alarm on and side rails up.  Problem: Fluid Volume: Goal: Hemodynamic stability will improve Outcome: Progressing

## 2023-12-24 NOTE — TOC Progression Note (Signed)
 Transition of Care [facility]) - Progression Note    Patient Details  Name: Jake Nguyen MRN: [id number] Date of Birth: [birth year]  [facility] CM/SW Contact  Sonda Manuella Quill, RN Phone Number: 12/24/2023, 4:34 PM  Clinical Narrative:    [facility] acknowledges consults for SA counseling and SNF; pt asleep in room and did not answer when name called; no family at bedside; unable to complete TOC assessment; will pass on to oncoming TOC for follow up.                     Expected Discharge Plan and Services                                               Social Drivers of Health (SDOH) Interventions SDOH Screenings   Food Insecurity: Patient Unable To Answer (12/21/2023)  Recent Concern: Food Insecurity - Food Insecurity Present (12/05/2023)  Housing: Unknown (12/21/2023)  Recent Concern: Housing - High Risk (12/05/2023)  Transportation Needs: Patient Unable To Answer (12/21/2023)  Recent Concern: Transportation Needs - Unmet Transportation Needs (12/05/2023)  Utilities: Patient Unable To Answer (12/21/2023)  Alcohol  Screen: Medium Risk (11/25/2020)  Depression (PHQ2-9): Low Risk  (11/06/2019)  Social Connections: Socially Isolated (07/20/2023)  Tobacco Use: High Risk (12/21/2023)    Readmission Risk Interventions    11/15/2023   11:58 AM  Readmission Risk Prevention Plan  Transportation Screening Complete  Medication Review (RN Care Manager) Complete  PCP or Specialist appointment within 3-5 days of discharge Complete  HRI or Home Care Consult Complete  SW Recovery Care/Counseling Consult Complete  Palliative Care Screening Not Applicable  Skilled Nursing Facility Complete

## 2023-12-24 NOTE — Plan of Care (Signed)

## 2023-12-25 DIAGNOSIS — L0390 Cellulitis, unspecified: Secondary | ICD-10-CM | POA: Diagnosis not present

## 2023-12-25 DIAGNOSIS — A419 Sepsis, unspecified organism: Secondary | ICD-10-CM | POA: Diagnosis not present

## 2023-12-25 DIAGNOSIS — M8660 Other chronic osteomyelitis, unspecified site: Secondary | ICD-10-CM | POA: Diagnosis not present

## 2023-12-25 LAB — BASIC METABOLIC PANEL WITH GFR
Anion gap: 9 (ref 5–15)
BUN: <5 mg/dL — ABNORMAL LOW (ref 6–20)
CO2: 24 mmol/L (ref 22–32)
Calcium: 8.8 mg/dL — ABNORMAL LOW (ref 8.9–10.3)
Chloride: 106 mmol/L (ref 98–111)
Creatinine, Ser: 0.36 mg/dL — ABNORMAL LOW (ref 0.61–1.24)
GFR, Estimated: >60 mL/min (ref >60)
Glucose, Bld: 114 mg/dL — ABNORMAL HIGH (ref 70–99)
Potassium: 3.8 mmol/L (ref 3.5–5.1)
Sodium: 139 mmol/L (ref 135–145)

## 2023-12-25 LAB — MAGNESIUM: Magnesium: 1.9 mg/dL (ref 1.7–2.4)

## 2023-12-25 LAB — CBC WITH DIFFERENTIAL/PLATELET
Abs Immature Granulocytes: 0.02 K/uL (ref 0.00–0.07)
Basophils Absolute: 0 K/uL (ref 0.0–0.1)
Basophils Relative: 0 %
Eosinophils Absolute: 1 K/uL — ABNORMAL HIGH (ref 0.0–0.5)
Eosinophils Relative: 14 %
HCT: 26.2 % — ABNORMAL LOW (ref 39.0–52.0)
Hemoglobin: 8.1 g/dL — ABNORMAL LOW (ref 13.0–17.0)
Immature Granulocytes: 0 %
Lymphocytes Relative: 36 %
Lymphs Abs: 2.4 K/uL (ref 0.7–4.0)
MCH: 26.5 pg (ref 26.0–34.0)
MCHC: 30.9 g/dL (ref 30.0–36.0)
MCV: 85.6 fL (ref 80.0–100.0)
Monocytes Absolute: 1 K/uL (ref 0.1–1.0)
Monocytes Relative: 15 %
Neutro Abs: 2.4 K/uL (ref 1.7–7.7)
Neutrophils Relative %: 35 %
Platelets: 286 K/uL (ref 150–400)
RBC: 3.06 MIL/uL — ABNORMAL LOW (ref 4.22–5.81)
RDW: 15.9 % — ABNORMAL HIGH (ref 11.5–15.5)
WBC: 6.8 K/uL (ref 4.0–10.5)
nRBC: 0 % (ref 0.0–0.2)

## 2023-12-25 NOTE — Evaluation (Signed)
 Physical Therapy Evaluation Patient Details Name: Jake Nguyen MRN: [id number] DOB: 1965/05/27 Today's Date: 12/25/2023  History of Present Illness  Jake Nguyen is a 58 year old male who presented with chest pain and bilateral leg pain dx with sepsis, acute cellulitis, Afib with RVR.  PMH homelessness, alcohol  abuse, depression/anxiety, chronic osteomyelitis, left AKA 2022, right BKA May 2025, A-fib.  Clinical Impression  Pt admitted with above diagnosis.  Pt currently with functional limitations due to the deficits listed below (see PT Problem List). Pt will benefit from acute skilled PT to increase their independence and safety with mobility to allow discharge.       Mobility is limited  due to patient reports of pain in buttocks and post thighs.   Patient does self assist with rolling.  Patient  recently [facility] from [facility] to  [facility], clearly requires increased level of care. Patient is WC dependent at baseline. There is a cushion/wedge in his room with his name on it that patient is unaware of which does not appear  to be cushion of choice, it is wedged, has cutout at sacrum and is very firm. Epic notes from [facility] last admission indicates a  cushion was ordered.     If plan is discharge home, recommend the following: A lot of help with walking and/or transfers;A lot of help with bathing/dressing/bathroom;Assistance with cooking/housework   Can travel by private vehicle   No    Equipment Recommendations Wheelchair cushion (measurements PT)  Recommendations for Other Services       Functional Status Assessment Patient has had a recent decline in their functional status and/or demonstrates limited ability to make significant improvements in function in a reasonable and predictable amount of time     Precautions / Restrictions Precautions Precautions: Fall;Other (comment) Recall of Precautions/Restrictions: Impaired Precaution/Restrictions Comments: excoriation wouns posterior  right  thigh and buttocks Restrictions Other Position/Activity Restrictions: R BKA, L AKA      Mobility  Bed Mobility   Bed Mobility: Rolling Rolling: Max assist, +2 for physical assistance, +2 for safety/equipment, Used rails         General bed mobility comments: +2 to ensure skin protection and reduce shearing, patient declined EOB sitting this session    Transfers                   General transfer comment: pt declined    Ambulation/Gait                  Stairs            Wheelchair Mobility     Tilt Bed    Modified Rankin (Stroke Patients Only)       Balance       Sitting balance - Comments: declined EOB this session                                     Pertinent Vitals/Pain Pain Assessment Pain Location: posterior body/buttocks, LE's Pain Descriptors / Indicators: Discomfort, Guarding Pain Intervention(s): Limited activity within patient's tolerance, Monitored during session, Premedicated before session, Repositioned    Home Living Family/patient expects to be discharged to:: Shelter/Homeless                   Additional Comments: homeless    Prior Function Prior Level of Function : History of Falls (last six months)  Mobility Comments: AP transfers at basline however limited prior to admission with severe skin breakdown ADLs Comments: UB indep with poot ability to manage peri and LB regions with skin issues and open wounds compounding housing insecurity     Extremity/Trunk Assessment   Upper Extremity Assessment Upper Extremity Assessment: Generalized weakness    Lower Extremity Assessment Lower Extremity Assessment: RLE deficits/detail;LLE deficits/detail RLE Deficits / Details: Recent R BKA,  hip flexion 3/5, knee flex 90 degrees, knee ext lacks ~10 degrees of extension,  hip lacks 40* extension, wounds all along posterior of hip and thigh, LLE Deficits / Details: L AKA, wounds to  posterior thigh and buttock    Cervical / Trunk Assessment Cervical / Trunk Assessment: Normal  Communication   Communication Communication: No apparent difficulties    Cognition Arousal: Alert Behavior During Therapy: Flat affect, WFL for tasks assessed/performed   PT - Cognitive impairments: Problem solving, Safety/Judgement                         Following commands: Intact       Cueing Cueing Techniques: Verbal cues     General Comments      Exercises     Assessment/Plan    PT Assessment Patient needs continued PT services  PT Problem List Decreased skin integrity;Pain;Impaired sensation;Decreased strength;Decreased range of motion;Decreased activity tolerance;Decreased mobility;Decreased safety awareness       PT Treatment Interventions DME instruction;Functional mobility training;Therapeutic activities;Therapeutic exercise;Balance training;Cognitive remediation;Wheelchair mobility training    PT Goals (Current goals can be found in the Care Plan section)  Acute Rehab PT Goals Patient Stated Goal: not stated PT Goal Formulation: With patient Time For Goal Achievement: 01/08/24 Potential to Achieve Goals: Poor    Frequency Min 2X/week     Co-evaluation PT/OT/SLP Co-Evaluation/Treatment: Yes Reason for Co-Treatment: To address functional/ADL transfers PT goals addressed during session: Mobility/safety with mobility OT goals addressed during session: ADL's and self-care       AM-PAC PT 6 Clicks Mobility  Outcome Measure Help needed turning from your back to your side while in a flat bed without using bedrails?: A Lot Help needed moving from lying on your back to sitting on the side of a flat bed without using bedrails?: Total Help needed moving to and from a bed to a chair (including a wheelchair)?: Total Help needed standing up from a chair using your arms (e.g., wheelchair or bedside chair)?: Total Help needed to walk in hospital room?:  Total Help needed climbing 3-5 steps with a railing? : Total 6 Click Score: 7    End of Session Equipment Utilized During Treatment: Gait belt Activity Tolerance: Patient limited by pain;Other (comment) Patient left: with call bell/phone within reach;with bed alarm set Nurse Communication: Mobility status PT Visit Diagnosis: Muscle weakness (generalized) (M62.81);Adult, failure to thrive  (R62.7);Pain    Time: 9153-9080 PT Time Calculation (min) (ACUTE ONLY): 33 min   Charges:   PT Evaluation $PT Eval Low Complexity: 1 Low   PT General Charges $$ ACUTE PT VISIT: 1 Visit         Darice Potters PT Acute Rehabilitation Services Office [phone]   Potters Darice Norris 12/25/2023, 1:23 PM

## 2023-12-25 NOTE — Progress Notes (Signed)
 Progress Note    Jake Nguyen   FMW:[id number]  DOB: 1965-08-26  DOA: 12/21/2023     4 PCP: Pcp, No  Initial CC: leg infection  Hospital Course: Mr. Jake Nguyen is a 58 year old male with PMH homelessness, alcohol  abuse, depression/anxiety, chronic osteomyelitis, left AKA 2022, right BKA May 2025, A-fib. He presented with chest pain and bilateral leg pain.  Patient was discharged from the hospital on 12/12/2023 after treatment for acute on chronic hyponatremia, rapid atrial fibrillation, and lactic acidosis.  It was recommended that he go to an SNF on discharge but the patient refused.  He was picked up by EMS today the same paper scrubs he was discharged in over a week ago which were said to be soiled in urine and feces and adherent to his skin.   Patient notes that he has been having fleeting episodes of sharp chest pain accompanied by sensation that his heart is racing.  He has also been experiencing increased pain in his bilateral lower extremities.   ED Course: Upon arrival to the ED, patient is found to be febrile to 38.3 C and saturating well on room air with elevated heart rate and SBP in the 90-110 range.  Labs are most notable for sodium 126, WBC 12,200, platelets 483,000, normal lactic acid, and troponin 24.   Blood cultures were collected in the ED and the patient was given 3 L NS, acetaminophen , Dilaudid , cefepime , vancomycin , and Flagyl .  CT right femur showed no acute abnormalities and diffuse edema throughout subcutaneous soft tissues. CT left femur showed chronic osteomyelitis and mild subcutaneous edema of the distal femur with no soft tissue gas or fluid collections.  Interval History:  No events overnight.  Remains much more comfortable each day and more awake and alert.  Eating relatively well. BP improving slowly.   Assessment and Plan: * Sepsis due to cellulitis (HCC) - Febrile, tachycardia, leukocytosis.  Presumed cellulitis of right BKA -Started on antibiotics  on admission.   - Discontinue vancomycin  and cefepime ; transition to doxycycline  to complete total of 7-day course -Continue wound care recommendations to lower extremities  PAF (paroxysmal atrial fibrillation) (HCC) - Not on anticoagulation - Continue Lopressor   Chronic osteomyelitis (HCC) CT right femur showed no acute abnormalities and diffuse edema throughout subcutaneous soft tissues. CT left femur showed chronic osteomyelitis and mild subcutaneous edema of the distal femur with no soft tissue gas or fluid collections.  Hypotension - Does not appear to be worsening sepsis however continues to have recurrent hypotension - Responds to fluid boluses - Blood pressure more stable now.  Okay to discontinue maintenance fluids and albumin   Homelessness - will need to ensure meds filled prior to discharge - may try to see if he wants to go to rehab this time if indicated and/or accepted again - TOC consulted  - PT/OT consulted   Hyponatremia-resolved as of 12/24/2023 - Suspected combination on admission of beer potomania and/or from dehydration/volume depletion given history of homelessness - s/p IVF - continue diet - BMP daily  Alcohol  use disorder, severe, dependence (HCC) - CIWA scores down trended.  Okay to discontinue  Hypokalemia - Replete as needed  Chest pain-resolved as of 12/22/2023 - Found to be in A-fib with RVR on admission; likely had chest pain related to this - trop trend relatively flat and tachy mediated - continue rate control     Old records reviewed in assessment of this patient  Antimicrobials: Cefepime  12/21/2023 >> 12/24/2023 Flagyl  12/21/2023 >> x 1 Vancomycin  12/21/2023 >>  12/24/2023 Doxycycline  12/24/2023 >> current  DVT prophylaxis:  enoxaparin  (LOVENOX ) injection 40 mg Start: 12/21/23 2200   Code Status:   Code Status: Full Code  Mobility Assessment (Last 72 Hours)     Mobility Assessment     Row Name 12/25/23 0845 12/24/23 2000 12/24/23  0933 12/23/23 2000 12/23/23 0804   Does the patient have exclusion criteria? -- Yes - Bedfast (Level 1) - Select exclusion criteria in next row Yes - Bedfast (Level 1) - Select exclusion criteria in next row Yes - Bedfast (Level 1) - Select exclusion criteria in next row Yes - Bedfast (Level 1) - Select exclusion criteria in next row   Mobility Assessment Exclusion Criteria -- Order to exclude patient from protocol Order to exclude patient from protocol Order to exclude patient from protocol Order to exclude patient from protocol   What is the highest level of mobility based on the mobility assessment? Level 1 (Bedfast) - Unable to balance while sitting on edge of bed Level 1 (Bedfast) - Unable to balance while sitting on edge of bed -- Level 1 (Bedfast) - Unable to balance while sitting on edge of bed --   Is the above level different from baseline mobility prior to current illness? -- No - Consider discontinuing PT/OT -- No - Consider discontinuing PT/OT --    Row Name 12/23/23 0800 12/22/23 2000         Does the patient have exclusion criteria? -- No - Perform mobility assessment      Mobility Assessment Exclusion Criteria -- No exclusion criteria present, perform mobility assessment      What is the highest level of mobility based on the mobility assessment? -- Level 1 (Bedfast) - Unable to balance while sitting on edge of bed      Is the above level different from baseline mobility prior to current illness? No - Consider discontinuing PT/OT No - Consider discontinuing PT/OT         Barriers to discharge: homeless Disposition Plan:  TBD HH orders placed: n/a Status is: Inpt  Objective: Blood pressure 120/65, pulse (!) 106, temperature 99.8 F (37.7 C), temperature source Oral, resp. rate 19, height 5' 10 (1.778 m), weight 63.1 kg, SpO2 96%.  Examination:  Physical Exam Constitutional:      General: He is not in acute distress.    Appearance: Normal appearance.     Comments:  Malodorous and very disheveled appearing adult man lying in bed in no distress.  Cooperative and more awake/alert  HENT:     Head: Normocephalic and atraumatic.     Mouth/Throat:     Mouth: Mucous membranes are dry.  Eyes:     Extraocular Movements: Extraocular movements intact.  Cardiovascular:     Rate and Rhythm: Tachycardia present. Rhythm irregular.  Pulmonary:     Effort: Pulmonary effort is normal. No respiratory distress.     Breath sounds: Normal breath sounds. No wheezing.  Abdominal:     General: Bowel sounds are normal. There is no distension.     Palpations: Abdomen is soft.     Tenderness: There is no abdominal tenderness.  Musculoskeletal:     Cervical back: Normal range of motion and neck supple.     Comments: Abrasions to stump noted on right BKA; flaking and scaling skin; TTP  Skin:    General: Skin is warm and dry.  Neurological:     General: No focal deficit present.  Psychiatric:        Mood  and Affect: Mood normal.        Behavior: Behavior normal.    Pic taken 9/12      Consultants:    Procedures:    Data Reviewed: Results for orders placed or performed during the hospital encounter of 12/21/23 (from the past 24 hours)  CBC with Differential/Platelet     Status: Abnormal   Collection Time: 12/25/23  2:52 AM  Result Value Ref Range   WBC 6.8 4.0 - 10.5 K/uL   RBC 3.06 (L) 4.22 - 5.81 MIL/uL   Hemoglobin 8.1 (L) 13.0 - 17.0 g/dL   HCT 73.7 (L) 60.9 - 47.9 %   MCV 85.6 80.0 - 100.0 fL   MCH 26.5 26.0 - 34.0 pg   MCHC 30.9 30.0 - 36.0 g/dL   RDW 84.0 (H) 88.4 - 84.4 %   Platelets 286 150 - 400 K/uL   nRBC 0.0 0.0 - 0.2 %   Neutrophils Relative % 35 %   Neutro Abs 2.4 1.7 - 7.7 K/uL   Lymphocytes Relative 36 %   Lymphs Abs 2.4 0.7 - 4.0 K/uL   Monocytes Relative 15 %   Monocytes Absolute 1.0 0.1 - 1.0 K/uL   Eosinophils Relative 14 %   Eosinophils Absolute 1.0 (H) 0.0 - 0.5 K/uL   Basophils Relative 0 %   Basophils Absolute 0.0 0.0 -  0.1 K/uL   Immature Granulocytes 0 %   Abs Immature Granulocytes 0.02 0.00 - 0.07 K/uL  Magnesium      Status: None   Collection Time: 12/25/23  2:52 AM  Result Value Ref Range   Magnesium  1.9 1.7 - 2.4 mg/dL  Basic metabolic panel with GFR     Status: Abnormal   Collection Time: 12/25/23  2:52 AM  Result Value Ref Range   Sodium 139 135 - 145 mmol/L   Potassium 3.8 3.5 - 5.1 mmol/L   Chloride 106 98 - 111 mmol/L   CO2 24 22 - 32 mmol/L   Glucose, Bld 114 (H) 70 - 99 mg/dL   BUN <5 (L) 6 - 20 mg/dL   Creatinine, Ser 9.63 (L) 0.61 - 1.24 mg/dL   Calcium  8.8 (L) 8.9 - 10.3 mg/dL   GFR, Estimated >39 >39 mL/min   Anion gap 9 5 - 15    I have reviewed pertinent nursing notes, vitals, labs, and images as necessary. I have ordered labwork to follow up on as indicated.  I have reviewed the last notes from staff over past 24 hours. I have discussed patient's care plan and test results with nursing staff, CM/SW, and other staff as appropriate.  Time spent: Greater than 50% of the 55 minute visit was spent in counseling/coordination of care for the patient as laid out in the A&P.   LOS: 4 days   Alm Apo, MD [facility] 12/25/2023, 12:17 PM

## 2023-12-25 NOTE — Evaluation (Addendum)
 Occupational Therapy Evaluation Patient Details Name: Jake Nguyen MRN: [id number] DOB: [birth year] Today's Date: 12/25/2023   History of Present Illness   Jake Nguyen is a 58 year old male who presented with chest pain and bilateral leg pain dx with sepsis, acute cellulitis, Afib with RVR.  PMH homelessness, alcohol  abuse, depression/anxiety, chronic osteomyelitis, left AKA 2022, right BKA May 2025, A-fib.     Clinical Impressions Patient is homeless and was recently hospitalized, refused rehab recommendation and issued replacement w/c and is now presenting back to hospital with limited history re: most recent ability however based on hygiene at time of admission, patient has been having significant difficulty managing his personal care and mobility. Currently, patient presents with deficits outlined below (see OT Problem List for details) most significantly pain, poor skin integrity with buttocks/sacral and residual limb wounds, decreased activity tolerance, cognition and generalized muscle weakness.  Patient requires continued Acute care hospital level OT services to progress safety and functional performance and allow for discharge. Patient will benefit from continued inpatient follow up therapy, <3 hours/day. Patient will require a skin protection wheel chair cushion and potential seating and positioning evaluation at next venue.        If plan is discharge home, recommend the following:   A lot of help with bathing/dressing/bathroom;Assistance with cooking/housework;Assist for transportation;Help with stairs or ramp for entrance;Supervision due to cognitive status     Functional Status Assessment   Patient has had a recent decline in their functional status and demonstrates the ability to make significant improvements in function in a reasonable and predictable amount of time.     Equipment Recommendations   Other (comment) (skin protection w/c cushion)       Precautions/Restrictions   Precautions Precautions: Fall;Other (comment) (skin) Recall of Precautions/Restrictions: Impaired Restrictions Weight Bearing Restrictions Per Provider Order: No Other Position/Activity Restrictions: R BKA, L AKA     Mobility Bed Mobility Overal bed mobility: Needs Assistance Bed Mobility: Rolling Rolling: Max assist, +2 for physical assistance, +2 for safety/equipment, Used rails         General bed mobility comments: +2 to ensure skin protection and reduce shearing, patient declined EOB sitting this session    Transfers                   General transfer comment: pt declined      Balance       Sitting balance - Comments: declined EOB this session                                   ADL either performed or assessed with clinical judgement   ADL Overall ADL's : Needs assistance/impaired Eating/Feeding: Set up;Bed level   Grooming: Wash/dry hands;Wash/dry face;Oral care;Applying deodorant;Minimal assistance   Upper Body Bathing: Moderate assistance;Bed level   Lower Body Bathing: Total assistance;Bed level Lower Body Bathing Details (indicate cue type and reason): bandages and skin issues limit ability Upper Body Dressing : Moderate assistance;Bed level   Lower Body Dressing: Total assistance;Bed level Lower Body Dressing Details (indicate cue type and reason): skin integrity limits ability Toilet Transfer:  (patient declined OOB this session, will require DABSC for trial)   Toileting- Clothing Manipulation and Hygiene: Total assistance;Bed level Toileting - Clothing Manipulation Details (indicate cue type and reason): has purewick and significant sacral, buttocks and LE wounds     Functional mobility during ADLs: Maximal assistance (bed level) General ADL Comments: open  to all therapy presented for self care, bed mobility and positioning bed level, pain and skin integrity limited oob     Vision Baseline  Vision/History: 0 No visual deficits Ability to See in Adequate Light: 0 Adequate Patient Visual Report: No change from baseline Vision Assessment?: No apparent visual deficits Additional Comments: able to see white board, OT badge and clock with accuracy            Pertinent Vitals/Pain Pain Assessment Pain Assessment: Faces Faces Pain Scale: Hurts whole lot Facial Expression: Grimacing Body Movements: Protection Muscle Tension: Tense, rigid Compliance with ventilator (intubated pts.): N/A Vocalization (extubated pts.): Talking in normal tone or no sound CPOT Total: 4 Pain Location: posterior body/buttocks, LE's Pain Descriptors / Indicators: Discomfort, Guarding Pain Intervention(s): Limited activity within patient's tolerance, Monitored during session, Repositioned, Premedicated before session     Extremity/Trunk Assessment Upper Extremity Assessment Upper Extremity Assessment: Generalized weakness;Left hand dominant (due to deconditioning overall)   Lower Extremity Assessment Lower Extremity Assessment: Defer to PT evaluation   Cervical / Trunk Assessment Cervical / Trunk Assessment: Normal   Communication Communication Communication: No apparent difficulties   Cognition Arousal: Alert Behavior During Therapy: Flat affect, WFL for tasks assessed/performed Cognition: Cognition impaired   Orientation impairments: Time, Situation Awareness: Intellectual awareness impaired, Online awareness impaired Memory impairment (select all impairments): Short-term memory Attention impairment (select first level of impairment): Sustained attention Executive functioning impairment (select all impairments): Initiation, Organization, Sequencing, Reasoning, Problem solving OT - Cognition Comments: most likely near baseline however significantly off on month/season and has limited healthcare literacy and insight                 Following commands: Intact        Cueing  General Comments   Cueing Techniques: Verbal cues  HR 114-120 bed level activity, painfrom skin issues limited activity tolerance with significant buttocks/LE wounds and overall poor skin integrity   Exercises     Shoulder Instructions      Home Living Family/patient expects to be discharged to:: Shelter/Homeless                                 Additional Comments: homeless      Prior Functioning/Environment Prior Level of Function : History of Falls (last six months)             Mobility Comments: AP transfers at basline however limited prior to admission with severe skin breakdown ADLs Comments: UB indep with poot ability to manage peri and LB regions with skin issues and open wounds compounding housing insecurity    OT Problem List: Decreased safety awareness;Impaired balance (sitting and/or standing);Decreased knowledge of precautions;Pain;Decreased strength;Decreased activity tolerance;Decreased cognition   OT Treatment/Interventions: Self-care/ADL training;Therapeutic exercise;Patient/family education;Balance training;Therapeutic activities      OT Goals(Current goals can be found in the care plan section)   Acute Rehab OT Goals Patient Stated Goal: to feel better OT Goal Formulation: With patient Time For Goal Achievement: 01/08/24 Potential to Achieve Goals: Fair ADL Goals Pt Will Perform Lower Body Bathing: with min assist;bed level Pt Will Perform Lower Body Dressing: with min assist;bed level Pt Will Transfer to Toilet: bedside commode;with mod assist Pt Will Perform Toileting - Clothing Manipulation and hygiene: with mod assist;sitting/lateral leans Pt/caregiver will Perform Home Exercise Program: Increased strength;Both right and left upper extremity;With written HEP provided Additional ADL Goal #1: Patient will teach back pressure releif, skin integrity and  positioning strategies with min cues   OT Frequency:  Min 1X/week        AM-PAC OT 6 Clicks Daily Activity     Outcome Measure Help from another person eating meals?: A Little Help from another person taking care of personal grooming?: A Little Help from another person toileting, which includes using toliet, bedpan, or urinal?: Total Help from another person bathing (including washing, rinsing, drying)?: A Lot Help from another person to put on and taking off regular upper body clothing?: A Lot Help from another person to put on and taking off regular lower body clothing?: Total 6 Click Score: 12   End of Session Equipment Utilized During Treatment: Other (comment) (bed level) Nurse Communication: Mobility status  Activity Tolerance: Patient limited by pain Patient left: in bed;with call bell/phone within reach;with bed alarm set  OT Visit Diagnosis: Other abnormalities of gait and mobility (R26.89);History of falling (Z91.81);Muscle weakness (generalized) (M62.81);Pain Pain - part of body:  (generalized LB and buttocks)                Time: 9154-9084 OT Time Calculation (min): 30 min Charges:  OT General Charges $OT Visit: 1 Visit OT Evaluation $OT Eval Low Complexity: 1 Low  Tyleigh Mahn OT/L Acute Rehabilitation Department  [phone]  12/25/2023, 9:56 AM

## 2023-12-26 DIAGNOSIS — M8660 Other chronic osteomyelitis, unspecified site: Secondary | ICD-10-CM | POA: Diagnosis not present

## 2023-12-26 DIAGNOSIS — L0390 Cellulitis, unspecified: Secondary | ICD-10-CM | POA: Diagnosis not present

## 2023-12-26 DIAGNOSIS — A419 Sepsis, unspecified organism: Secondary | ICD-10-CM | POA: Diagnosis not present

## 2023-12-26 DIAGNOSIS — I480 Paroxysmal atrial fibrillation: Secondary | ICD-10-CM | POA: Diagnosis not present

## 2023-12-26 LAB — CBC WITH DIFFERENTIAL/PLATELET
Abs Immature Granulocytes: 0.01 K/uL (ref 0.00–0.07)
Basophils Absolute: 0.1 K/uL (ref 0.0–0.1)
Basophils Relative: 1 %
Eosinophils Absolute: 0.8 K/uL — ABNORMAL HIGH (ref 0.0–0.5)
Eosinophils Relative: 11 %
HCT: 30.8 % — ABNORMAL LOW (ref 39.0–52.0)
Hemoglobin: 9.2 g/dL — ABNORMAL LOW (ref 13.0–17.0)
Immature Granulocytes: 0 %
Lymphocytes Relative: 36 %
Lymphs Abs: 2.6 K/uL (ref 0.7–4.0)
MCH: 26.1 pg (ref 26.0–34.0)
MCHC: 29.9 g/dL — ABNORMAL LOW (ref 30.0–36.0)
MCV: 87.5 fL (ref 80.0–100.0)
Monocytes Absolute: 0.9 K/uL (ref 0.1–1.0)
Monocytes Relative: 13 %
Neutro Abs: 2.8 K/uL (ref 1.7–7.7)
Neutrophils Relative %: 39 %
Platelets: 327 K/uL (ref 150–400)
RBC: 3.52 MIL/uL — ABNORMAL LOW (ref 4.22–5.81)
RDW: 15.7 % — ABNORMAL HIGH (ref 11.5–15.5)
Smear Review: NORMAL
WBC: 7.2 K/uL (ref 4.0–10.5)
nRBC: 0 % (ref 0.0–0.2)

## 2023-12-26 LAB — BASIC METABOLIC PANEL WITH GFR
Anion gap: 11 (ref 5–15)
BUN: <5 mg/dL — ABNORMAL LOW (ref 6–20)
CO2: 24 mmol/L (ref 22–32)
Calcium: 9 mg/dL (ref 8.9–10.3)
Chloride: 103 mmol/L (ref 98–111)
Creatinine, Ser: 0.36 mg/dL — ABNORMAL LOW (ref 0.61–1.24)
GFR, Estimated: >60 mL/min
Glucose, Bld: 108 mg/dL — ABNORMAL HIGH (ref 70–99)
Potassium: 3.7 mmol/L (ref 3.5–5.1)
Sodium: 138 mmol/L (ref 135–145)

## 2023-12-26 LAB — CULTURE, BLOOD (ROUTINE X 2)
Culture: NO GROWTH
Culture: NO GROWTH
Special Requests: ADEQUATE
Special Requests: ADEQUATE

## 2023-12-26 LAB — MAGNESIUM: Magnesium: 1.7 mg/dL (ref 1.7–2.4)

## 2023-12-26 NOTE — Plan of Care (Signed)
  Problem: Clinical Measurements: Goal: Signs and symptoms of infection will decrease Outcome: Progressing   Problem: Respiratory: Goal: Ability to maintain adequate ventilation will improve Outcome: Progressing   Problem: Education: Goal: Knowledge of General Education information will improve Description: Including pain rating scale, medication(s)/side effects and non-pharmacologic comfort measures Outcome: Progressing   Problem: Clinical Measurements: Goal: Cardiovascular complication will be avoided Outcome: Progressing   Problem: Coping: Goal: Level of anxiety will decrease Outcome: Progressing   Problem: Elimination: Goal: Will not experience complications related to bowel motility Outcome: Progressing Goal: Will not experience complications related to urinary retention Outcome: Progressing   Problem: Pain Managment: Goal: General experience of comfort will improve and/or be controlled Outcome: Progressing   Problem: Safety: Goal: Ability to remain free from injury will improve Outcome: Progressing

## 2023-12-26 NOTE — [place] FL2 (Addendum)
 [place]  MEDICAID FL2 LEVEL OF CARE FORM     IDENTIFICATION  Patient Name: Jake Nguyen Birthdate: 13-May-1965 Sex: male Admission Date (Current Location): 12/21/2023  [facility] [employer] and [place] Number:  Producer, television/film/video and Address:  [facility],  [address] [place], [place] [address]      Provider Number: [id number]  Attending Physician Name and Address:  Patsy Lenis, MD  Relative Name and Phone Number:  Benedetta Rosella (Sister)  [phone]    Current Level of Care: Hospital Recommended Level of Care: Skilled Nursing Facility Prior Approval Number:    Date Approved/Denied:   PASRR Number: [id number]  Discharge Plan: SNF    Current Diagnoses: Patient Active Problem List   Diagnosis Date Noted   Palliative care encounter 12/23/2023   Sepsis due to cellulitis (HCC) 12/21/2023   PAF (paroxysmal atrial fibrillation) (HCC) 12/04/2023   Alcohol  abuse 12/04/2023   Acute hyponatremia 12/03/2023   Atherosclerosis of native arteries of extremities with gangrene, right leg (HCC) 11/13/2023   Right below-knee amputee (HCC) 11/13/2023   Chronic osteomyelitis (HCC) 11/12/2023   Anxiety 11/12/2023   Homelessness 11/12/2023   Hyperlipidemia 11/12/2023   Malnutrition of moderate degree 09/24/2023   Cellulitis of right lower extremity 09/21/2023   Hypomagnesemia 09/21/2023   Pressure injury of buttock, stage 1 09/21/2023   Gangrene of right foot (HCC) 08/19/2023   Alcohol  use disorder, severe, dependence (HCC) 08/19/2023   Hypotension 08/19/2023   Depression 08/19/2023   Tobacco use 08/19/2023   Right foot infection 07/21/2023   Right BKA Wound infection 07/20/2023   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   Gram-negative bacteremia 11/24/2020   Cellulitis of left leg 11/19/2020   History of alcohol  withdrawal syndrome 11/19/2020   Hypokalemia 11/19/2020   Sepsis (HCC) 11/14/2020   S/P bilateral BKA (below knee amputation) (HCC)    Neuropathic pain, leg, left     Gangrene of foot (HCC) 07/14/2020   Ataxia    Pressure injury of skin 05/25/2018   Rhabdomyolysis 05/20/2018   Elevated troponin 05/20/2018   Moderate dehydration 05/20/2018   Chronic alcohol  use 05/20/2018   Transaminitis 05/20/2018    Orientation RESPIRATION BLADDER Height & Weight     Self, Time, Situation, Place  Normal Continent Weight: 63.1 kg Height:  5' 10 (177.8 cm)  BEHAVIORAL SYMPTOMS/MOOD NEUROLOGICAL BOWEL NUTRITION STATUS      Incontinent Diet (Regular)  AMBULATORY STATUS COMMUNICATION OF NEEDS Skin   Extensive Assist Verbally Other (Comment)                       Personal Care Assistance Level of Assistance  Bathing, Feeding, Dressing Bathing Assistance: Limited assistance Feeding assistance: Limited assistance Dressing Assistance: Limited assistance     Functional Limitations Info  Hearing, Sight, Speech Sight Info: Adequate Hearing Info: Adequate Speech Info: Adequate    SPECIAL CARE FACTORS FREQUENCY  PT (By licensed PT), OT (By licensed OT)     PT Frequency: 5x/wk OT Frequency: 5x/wk            Contractures Contractures Info: Not present    Additional Factors Info  Code Status, Allergies Code Status Info: FULL Allergies Info: Bactoshield Chg (Chlorhexidine  Gluconate), Pork-derived Products           Current Medications (12/26/2023):  This is the current hospital active medication list Current Facility-Administered Medications  Medication Dose Route Frequency Provider Last Rate Last Admin   acetaminophen  (TYLENOL ) tablet 650 mg  650 mg Oral Q6H PRN Opyd, Timothy S,  MD       Or   acetaminophen  (TYLENOL ) suppository 650 mg  650 mg Rectal Q6H PRN Opyd, Timothy S, MD       bisacodyl  (DULCOLAX) EC tablet 5 mg  5 mg Oral Daily PRN Opyd, Timothy S, MD       diphenhydrAMINE  (BENADRYL ) capsule 25 mg  25 mg Oral Q6H PRN Patsy Lenis, MD   25 mg at 12/24/23 2159   doxycycline  (VIBRA -TABS) tablet 100 mg  100 mg Oral Q12H Girguis, David,  MD   100 mg at 12/26/23 0935   enoxaparin  (LOVENOX ) injection 40 mg  40 mg Subcutaneous Q24H Opyd, Timothy S, MD   40 mg at 12/25/23 2202   folic acid  (FOLVITE ) tablet 1 mg  1 mg Oral Daily Opyd, Timothy S, MD   1 mg at 12/26/23 0937   Gerhardt's butt cream 1 Application  1 Application Topical TID Charlton Evalene RAMAN, MD   1 Application at 12/26/23 1049   hydrocerin (EUCERIN) cream   Topical BID Daniels, James K, NP   Given at 12/25/23 2202   hydrocortisone  cream 1 %   Topical QID Daniels, James K, NP   Given at 12/25/23 1031   HYDROmorphone  (DILAUDID ) injection 0.5 mg  0.5 mg Intravenous Q4H PRN Opyd, Timothy S, MD   0.5 mg at 12/25/23 1452   hydrOXYzine  (ATARAX ) tablet 25 mg  25 mg Oral TID PRN Patsy Lenis, MD   25 mg at 12/25/23 9273   metoprolol  tartrate (LOPRESSOR ) injection 5 mg  5 mg Intravenous Q2H PRN Patsy Lenis, MD   5 mg at 12/25/23 9773   metoprolol  tartrate (LOPRESSOR ) tablet 25 mg  25 mg Oral BID Patsy Lenis, MD   25 mg at 12/26/23 0935   mirtazapine  (REMERON ) tablet 15 mg  15 mg Oral QHS Opyd, Timothy S, MD   15 mg at 12/25/23 2201   multivitamin with minerals tablet 1 tablet  1 tablet Oral Daily Opyd, Timothy S, MD   1 tablet at 12/26/23 0935   Oral care mouth rinse  15 mL Mouth Rinse PRN Opyd, Evalene RAMAN, MD       oxyCODONE  (Oxy IR/ROXICODONE ) immediate release tablet 5 mg  5 mg Oral Q4H PRN Opyd, Timothy S, MD   5 mg at 12/22/23 1700   polyethylene glycol (MIRALAX  / GLYCOLAX ) packet 17 g  17 g Oral Daily PRN Opyd, Timothy S, MD       prochlorperazine  (COMPAZINE ) injection 5 mg  5 mg Intravenous Q6H PRN Opyd, Evalene RAMAN, MD       sodium chloride  flush (NS) 0.9 % injection 10-40 mL  10-40 mL Intracatheter PRN Patsy Lenis, MD       sodium chloride  flush (NS) 0.9 % injection 3 mL  3 mL Intravenous Q12H Opyd, Timothy S, MD   3 mL at 12/26/23 9061   thiamine  (VITAMIN B1) tablet 100 mg  100 mg Oral Daily Opyd, Timothy S, MD   100 mg at 12/26/23 9064   Or   thiamine  (VITAMIN B1)  injection 100 mg  100 mg Intravenous Daily Opyd, Timothy S, MD   100 mg at 12/23/23 9077   traZODone  (DESYREL ) tablet 50 mg  50 mg Oral QHS PRN Opyd, Timothy S, MD         Discharge Medications: Please see discharge summary for a list of discharge medications.  Relevant Imaging Results:  Relevant Lab Results:   Additional Information SSN: [id number]  Jon ONEIDA Anon, RN

## 2023-12-26 NOTE — Plan of Care (Signed)

## 2023-12-26 NOTE — TOC Progression Note (Signed)
 Transition of Care [facility]) - Progression Note    Patient Details  Name: Jake Nguyen MRN: [id number] Date of Birth: 07-09-1965  Transition of [facility]) CM/SW Contact  Jon ONEIDA Anon, RN Phone Number: 12/26/2023, 12:03 PM  Clinical Narrative:    NCM met with pt at bedside, introduced role in DC planning. Discussed the PT/OT recommendation for SNF placement and pt is agreeable to recommendation. Pt states he uses a W/C at baseline for ambulation and has been homeless living on the street. States he does not have any family or friend to reside with. NCM will send out referrals for SNF placement. IP Care Management following.     Expected Discharge Plan: Skilled Nursing Facility Barriers to Discharge: Continued Medical Work up, Homeless with medical needs               Expected Discharge Plan and Services In-house Referral: Clinical Social Work Discharge Planning Services: CM Consult Post Acute Care Choice: Skilled Nursing Facility Living arrangements for the past 2 months: Homeless                 DME Arranged: N/A DME Agency: NA       HH Arranged: NA HH Agency: NA         Social Drivers of Health (SDOH) Interventions SDOH Screenings   Food Insecurity: Patient Unable To Answer (12/21/2023)  Recent Concern: Food Insecurity - Food Insecurity Present (12/05/2023)  Housing: Unknown (12/21/2023)  Recent Concern: Housing - High Risk (12/05/2023)  Transportation Needs: Patient Unable To Answer (12/21/2023)  Recent Concern: Transportation Needs - Unmet Transportation Needs (12/05/2023)  Utilities: Patient Unable To Answer (12/21/2023)  Alcohol  Screen: Medium Risk (11/25/2020)  Depression (PHQ2-9): Low Risk  (11/06/2019)  Social Connections: Socially Isolated (07/20/2023)  Tobacco Use: High Risk (12/21/2023)    Readmission Risk Interventions    11/15/2023   11:58 AM  Readmission Risk Prevention Plan  Transportation Screening Complete  Medication Review (RN Care Manager)  Complete  PCP or Specialist appointment within 3-5 days of discharge Complete  HRI or Home Care Consult Complete  SW Recovery Care/Counseling Consult Complete  Palliative Care Screening Not Applicable  Skilled Nursing Facility Complete

## 2023-12-26 NOTE — Progress Notes (Signed)
 Progress Note    WALDRON GERRY   FMW:[id number]  DOB: 10/09/1965  DOA: 12/21/2023     5 PCP: Pcp, No  Initial CC: leg infection  Hospital Course: Mr. Kurka is a 58 year old male with PMH homelessness, alcohol  abuse, depression/anxiety, chronic osteomyelitis, left AKA 2022, right BKA May 2025, A-fib. He presented with chest pain and bilateral leg pain.  Patient was discharged from the hospital on 12/12/2023 after treatment for acute on chronic hyponatremia, rapid atrial fibrillation, and lactic acidosis.  It was recommended that he go to an SNF on discharge but the patient refused.  He was picked up by EMS today the same paper scrubs he was discharged in over a week ago which were said to be soiled in urine and feces and adherent to his skin.   Patient notes that he has been having fleeting episodes of sharp chest pain accompanied by sensation that his heart is racing.  He has also been experiencing increased pain in his bilateral lower extremities.   ED Course: Upon arrival to the ED, patient is found to be febrile to 38.3 C and saturating well on room air with elevated heart rate and SBP in the 90-110 range.  Labs are most notable for sodium 126, WBC 12,200, platelets 483,000, normal lactic acid, and troponin 24.   Blood cultures were collected in the ED and the patient was given 3 L NS, acetaminophen , Dilaudid , cefepime , vancomycin , and Flagyl .  CT right femur showed no acute abnormalities and diffuse edema throughout subcutaneous soft tissues. CT left femur showed chronic osteomyelitis and mild subcutaneous edema of the distal femur with no soft tissue gas or fluid collections.  Interval History:  No events overnight.  Improving daily.  Seems to be stable compared to admission now.  He is amenable with going to SNF.  Bed search underway.  Assessment and Plan: * Sepsis due to cellulitis (HCC) - Febrile, tachycardia, leukocytosis.  Presumed cellulitis of right BKA -Started on  antibiotics on admission.   - Discontinue vancomycin  and cefepime ; transition to doxycycline  to complete total of 7-day course -Continue wound care recommendations to lower extremities  PAF (paroxysmal atrial fibrillation) (HCC) - Not on anticoagulation - Continue Lopressor   Chronic osteomyelitis (HCC) CT right femur showed no acute abnormalities and diffuse edema throughout subcutaneous soft tissues. CT left femur showed chronic osteomyelitis and mild subcutaneous edema of the distal femur with no soft tissue gas or fluid collections.  Hypotension-resolved as of 12/26/2023 - Does not appear to be worsening sepsis however continues to have recurrent hypotension - Responds to fluid boluses - Blood pressure more stable now.  Okay to discontinue maintenance fluids and albumin   Homelessness - will need to ensure meds filled prior to discharge - may try to see if he wants to go to rehab this time if indicated and/or accepted again - TOC consulted  - PT/OT consulted  - He is now amenable to going to rehab at discharge  Hyponatremia-resolved as of 12/24/2023 - Suspected combination on admission of beer potomania and/or from dehydration/volume depletion given history of homelessness - s/p IVF - continue diet - BMP daily  Alcohol  use disorder, severe, dependence (HCC) - CIWA scores down trended.  Okay to discontinue  Hypokalemia - Replete as needed  Chest pain-resolved as of 12/22/2023 - Found to be in A-fib with RVR on admission; likely had chest pain related to this - trop trend relatively flat and tachy mediated - continue rate control     Old records reviewed  in assessment of this patient  Antimicrobials: Cefepime  12/21/2023 >> 12/24/2023 Flagyl  12/21/2023 >> x 1 Vancomycin  12/21/2023 >> 12/24/2023 Doxycycline  12/24/2023 >> current  DVT prophylaxis:  enoxaparin  (LOVENOX ) injection 40 mg Start: 12/21/23 2200   Code Status:   Code Status: Full Code  Mobility Assessment (Last  72 Hours)     Mobility Assessment     Row Name 12/26/23 0728 12/25/23 2000 12/25/23 1332 12/25/23 1319 12/25/23 0845   Does the patient have exclusion criteria? Yes - Bedfast (Level 1) - Select exclusion criteria in next row Yes - Bedfast (Level 1) - Select exclusion criteria in next row Yes - Bedfast (Level 1) - Select exclusion criteria in next row -- --   Mobility Assessment Exclusion Criteria Order to exclude patient from protocol -- Order to exclude patient from protocol -- --   What is the highest level of mobility based on the mobility assessment? Level 1 (Bedfast) - Unable to balance while sitting on edge of bed -- -- Level 1 (Bedfast) - Unable to balance while sitting on edge of bed Level 1 (Bedfast) - Unable to balance while sitting on edge of bed   Is the above level different from baseline mobility prior to current illness? No - Consider discontinuing PT/OT -- -- -- --    Row Name 12/24/23 2000 12/24/23 0933 12/23/23 2000       Does the patient have exclusion criteria? Yes - Bedfast (Level 1) - Select exclusion criteria in next row Yes - Bedfast (Level 1) - Select exclusion criteria in next row Yes - Bedfast (Level 1) - Select exclusion criteria in next row     Mobility Assessment Exclusion Criteria Order to exclude patient from protocol Order to exclude patient from protocol Order to exclude patient from protocol     What is the highest level of mobility based on the mobility assessment? Level 1 (Bedfast) - Unable to balance while sitting on edge of bed -- Level 1 (Bedfast) - Unable to balance while sitting on edge of bed     Is the above level different from baseline mobility prior to current illness? No - Consider discontinuing PT/OT -- No - Consider discontinuing PT/OT        Barriers to discharge: homeless Disposition Plan: SNF HH orders placed: n/a Status is: Inpt  Objective: Blood pressure 115/60, pulse (!) 107, temperature 98.1 F (36.7 C), temperature source Oral,  resp. rate (!) 27, height 5' 10 (1.778 m), weight 63.1 kg, SpO2 96%.  Examination:  Physical Exam Constitutional:      General: He is not in acute distress.    Appearance: Normal appearance.     Comments: Malodorous and very disheveled appearing adult man lying in bed in no distress.  Cooperative and more awake/alert  HENT:     Head: Normocephalic and atraumatic.     Mouth/Throat:     Mouth: Mucous membranes are dry.  Eyes:     Extraocular Movements: Extraocular movements intact.  Cardiovascular:     Rate and Rhythm: Tachycardia present. Rhythm irregular.  Pulmonary:     Effort: Pulmonary effort is normal. No respiratory distress.     Breath sounds: Normal breath sounds. No wheezing.  Abdominal:     General: Bowel sounds are normal. There is no distension.     Palpations: Abdomen is soft.     Tenderness: There is no abdominal tenderness.  Musculoskeletal:     Cervical back: Normal range of motion and neck supple.     Comments:  Abrasions to stump noted on right BKA; flaking and scaling skin; TTP  Skin:    General: Skin is warm and dry.  Neurological:     General: No focal deficit present.  Psychiatric:        Mood and Affect: Mood normal.        Behavior: Behavior normal.    Pic taken 9/12      Consultants:    Procedures:    Data Reviewed: Results for orders placed or performed during the hospital encounter of 12/21/23 (from the past 24 hours)  CBC with Differential/Platelet     Status: Abnormal   Collection Time: 12/26/23  4:36 AM  Result Value Ref Range   WBC 7.2 4.0 - 10.5 K/uL   RBC 3.52 (L) 4.22 - 5.81 MIL/uL   Hemoglobin 9.2 (L) 13.0 - 17.0 g/dL   HCT 69.1 (L) 60.9 - 47.9 %   MCV 87.5 80.0 - 100.0 fL   MCH 26.1 26.0 - 34.0 pg   MCHC 29.9 (L) 30.0 - 36.0 g/dL   RDW 84.2 (H) 88.4 - 84.4 %   Platelets 327 150 - 400 K/uL   nRBC 0.0 0.0 - 0.2 %   Neutrophils Relative % 39 %   Neutro Abs 2.8 1.7 - 7.7 K/uL   Lymphocytes Relative 36 %   Lymphs Abs 2.6 0.7  - 4.0 K/uL   Monocytes Relative 13 %   Monocytes Absolute 0.9 0.1 - 1.0 K/uL   Eosinophils Relative 11 %   Eosinophils Absolute 0.8 (H) 0.0 - 0.5 K/uL   Basophils Relative 1 %   Basophils Absolute 0.1 0.0 - 0.1 K/uL   RBC Morphology MORPHOLOGY UNREMARKABLE    Smear Review Normal platelet morphology    Immature Granulocytes 0 %   Abs Immature Granulocytes 0.01 0.00 - 0.07 K/uL   Reactive, Benign Lymphocytes PRESENT   Magnesium      Status: None   Collection Time: 12/26/23  4:36 AM  Result Value Ref Range   Magnesium  1.7 1.7 - 2.4 mg/dL  Basic metabolic panel with GFR     Status: Abnormal   Collection Time: 12/26/23  4:36 AM  Result Value Ref Range   Sodium 138 135 - 145 mmol/L   Potassium 3.7 3.5 - 5.1 mmol/L   Chloride 103 98 - 111 mmol/L   CO2 24 22 - 32 mmol/L   Glucose, Bld 108 (H) 70 - 99 mg/dL   BUN <5 (L) 6 - 20 mg/dL   Creatinine, Ser 9.63 (L) 0.61 - 1.24 mg/dL   Calcium  9.0 8.9 - 10.3 mg/dL   GFR, Estimated >39 >39 mL/min   Anion gap 11 5 - 15    I have reviewed pertinent nursing notes, vitals, labs, and images as necessary. I have ordered labwork to follow up on as indicated.  I have reviewed the last notes from staff over past 24 hours. I have discussed patient's care plan and test results with nursing staff, CM/SW, and other staff as appropriate.  Time spent: Greater than 50% of the 55 minute visit was spent in counseling/coordination of care for the patient as laid out in the A&P.   LOS: 5 days   Alm Apo, MD [facility] 12/26/2023, 2:05 PM

## 2023-12-27 DIAGNOSIS — L0390 Cellulitis, unspecified: Secondary | ICD-10-CM | POA: Diagnosis not present

## 2023-12-27 DIAGNOSIS — A419 Sepsis, unspecified organism: Secondary | ICD-10-CM | POA: Diagnosis not present

## 2023-12-27 MED ORDER — LACTATED RINGERS IV SOLN: INTRAVENOUS | Status: DC

## 2023-12-27 MED ORDER — MAGNESIUM SULFATE 2 GM/50ML IV SOLN
2.0000 g | Freq: Once | INTRAVENOUS | Status: AC
  Administered 2023-12-27: 2 g via INTRAVENOUS
  Filled 2023-12-27: qty 50

## 2023-12-27 MED ORDER — LACTATED RINGERS IV BOLUS
500.0000 mL | Freq: Once | INTRAVENOUS | Status: AC
  Administered 2023-12-27: 500 mL via INTRAVENOUS

## 2023-12-27 NOTE — TOC Progression Note (Signed)
 Transition of Care [facility]) - Progression Note    Patient Details  Name: Jake Nguyen MRN: [id number] Date of Birth: [birth year]  [facility]) CM/SW Contact  Jon ONEIDA Anon, RN Phone Number: 12/27/2023, 11:40 AM  Clinical Narrative:    NCM met with pt at bedside to discuss bed offers. Pt states he is agreeable to STR at [facility]. Pt states he does not want to go to facility for LTC.  NCM spoke with [facility] and she states will speak with the business office to initiate insurance authorization. ICM continuing to follow.      Expected Discharge Plan: Skilled Nursing Facility Barriers to Discharge: Continued Medical Work up, Homeless with medical needs               Expected Discharge Plan and Services In-house Referral: Clinical Social Work Discharge Planning Services: CM Consult Post Acute Care Choice: Skilled Nursing Facility Living arrangements for the past 2 months: Homeless                 DME Arranged: N/A DME Agency: NA       HH Arranged: NA HH Agency: NA         Social Drivers of Health (SDOH) Interventions SDOH Screenings   Food Insecurity: Patient Unable To Answer (12/21/2023)  Recent Concern: Food Insecurity - Food Insecurity Present (12/05/2023)  Housing: Unknown (12/21/2023)  Recent Concern: Housing - High Risk (12/05/2023)  Transportation Needs: Patient Unable To Answer (12/21/2023)  Recent Concern: Transportation Needs - Unmet Transportation Needs (12/05/2023)  Utilities: Patient Unable To Answer (12/21/2023)  Alcohol  Screen: Medium Risk (11/25/2020)  Depression (PHQ2-9): Low Risk  (11/06/2019)  Social Connections: Socially Isolated (07/20/2023)  Tobacco Use: High Risk (12/21/2023)    Readmission Risk Interventions    11/15/2023   11:58 AM  Readmission Risk Prevention Plan  Transportation Screening Complete  Medication Review (RN Care Manager) Complete  PCP or Specialist appointment within 3-5 days of discharge  Complete  HRI or Home Care Consult Complete  SW Recovery Care/Counseling Consult Complete  Palliative Care Screening Not Applicable  Skilled Nursing Facility Complete

## 2023-12-27 NOTE — Progress Notes (Signed)
 Physical Therapy Treatment Patient Details Name: Jake Nguyen MRN: [id number] DOB: [birth year] Today's Date: 12/27/2023   History of Present Illness Jake Nguyen is a 58 year old male who presented with chest pain and bilateral leg pain dx with sepsis, acute cellulitis, Afib with RVR.  PMH homelessness, alcohol  abuse, depression/anxiety, chronic osteomyelitis, left AKA 2022, right BKA May 2025, A-fib.    PT Comments   Patient agreeable to sitting on bed edge, able to manage to push to sitting. Tolerated x ~ 30 secs, with increased buttock and posterior right leg pain.  Patient attempted to scoot using max support of UE's but could not clear buttocks from bed to shift.  Patient will benefit from continued inpatient follow up therapy, <3 hours/day      If plan is discharge home, recommend the following: A lot of help with walking and/or transfers;A lot of help with bathing/dressing/bathroom;Assistance with cooking/housework   Can travel by [place] (measurements PT)    Recommendations for Other Services       Precautions / Restrictions Precautions Recall of Precautions/Restrictions: Impaired Precaution/Restrictions Comments: excoriation wounds posterior  right thigh and buttocks Restrictions Other Position/Activity Restrictions: R BKA, L AKA     Mobility  Bed Mobility   Bed Mobility: Rolling, Supine to Sit, Sit to Supine Rolling: Independent, Used rails   Supine to sit: Min assist Sit to supine: Contact guard assist   General bed mobility comments: able to push self to sitting slowly with effort, sat x ~ 30secs then returned to side due to pain. Attempted use of UE's to scoot to [facility] but unable to  raise  high  trunk high enough to clear bed to  shift.    Transfers                        Ambulation/Gait                   Stairs             Wheelchair Mobility     Tilt Bed     Modified Rankin (Stroke Patients Only)       Balance Overall balance assessment: Needs assistance Sitting-balance support: Bilateral upper extremity supported Sitting balance-Leahy Scale: Poor Sitting balance - Comments: reliant on UE's, needing to shift to off load buttocks , could not tolerate pressure                                    Communication Communication Communication: No apparent difficulties  Cognition Arousal: Alert Behavior During Therapy: WFL for tasks assessed/performed   PT - Cognitive impairments: No apparent impairments                       PT - Cognition Comments: pt alert, coperative Following commands: Intact      Cueing Cueing Techniques: Verbal cues  Exercises      General Comments        Pertinent Vitals/Pain Pain Assessment Faces Pain Scale: Hurts even more Pain Location: buttocks Pain Descriptors / Indicators: Discomfort, Guarding Pain Intervention(s): Monitored during session, Repositioned    Home Living                          Prior Function  PT Goals (current goals can now be found in the care plan section) Progress towards PT goals: Progressing toward goals    Frequency    Min 2X/week      PT Plan      Co-evaluation              AM-PAC PT 6 Clicks Mobility   Outcome Measure  Help needed turning from your back to your side while in a flat bed without using bedrails?: None Help needed moving from lying on your back to sitting on the side of a flat bed without using bedrails?: A Little Help needed moving to and from a bed to a chair (including a wheelchair)?: A Little Help needed standing up from a chair using your arms (e.g., wheelchair or bedside chair)?: Total Help needed to walk in hospital room?: Total Help needed climbing 3-5 steps with a railing? : Total 6 Click Score: 13    End of Session   Activity Tolerance: Patient limited by pain;Other  (comment) Patient left: with call bell/phone within reach;in bed Nurse Communication: Mobility status PT Visit Diagnosis: Muscle weakness (generalized) (M62.81);Adult, failure to thrive  (R62.7);Pain Pain - part of body: Leg     Time: 8464-8447 PT Time Calculation (min) (ACUTE ONLY): 17 min  Charges:    $Therapeutic Activity: 8-22 mins PT General Charges $$ ACUTE PT VISIT: 1 Visit                     Darice Potters PT Acute Rehabilitation Services Office [phone]    Potters Darice Norris 12/27/2023, 3:59 PM

## 2023-12-27 NOTE — Plan of Care (Signed)
  Problem: Respiratory: Goal: Ability to maintain adequate ventilation will improve Outcome: Progressing   Problem: Education: Goal: Knowledge of General Education information will improve Description: Including pain rating scale, medication(s)/side effects and non-pharmacologic comfort measures Outcome: Progressing   Problem: Coping: Goal: Level of anxiety will decrease Outcome: Progressing   Problem: Pain Managment: Goal: General experience of comfort will improve and/or be controlled Outcome: Progressing   Problem: Safety: Goal: Ability to remain free from injury will improve Outcome: Progressing

## 2023-12-27 NOTE — Progress Notes (Signed)
 PROGRESS NOTE    Jake Nguyen  FMW:[id number] DOB: 11-08-1965 DOA: 12/21/2023 PCP: Freddrick, No   Brief Narrative: 58 year old with past medical history significant for homelessness, alcohol  abuse, depression/anxiety, chronic osteomyelitis, left AKA 2022, right BKA May 2025, A-fib presents with chest pain and bilateral leg pain.  Patient was discharged from hospital 12/12/2023 after treatment for acute on chronic hyponatremia, rapid A-fib and lactic acidosis.  He was recommended that he will go to a SNF on discharge but patient refused.  Patient was picked up by EMS with the same paper scrubs he was discharged in over a week ago, he was soiled in urine and feces other into his skin.  He has been having episode of sharp chest pain and companied by palpitation.  Presented with hyponatremia sodium 126, white blood cell 12 systolic blood pressure between 90s and 110s.  CT femur showed no acute abnormality and diffuse edema throughout the subcutaneous soft tissue.  CT left femur showed chronic osteomyelitis and mild subcutaneous edema of the distal femur with no soft tissue gas or fluid collection.     Assessment & Plan:   Principal Problem:   Sepsis due to cellulitis [facility]) Active Problems:   Chronic osteomyelitis (HCC)   PAF (paroxysmal atrial fibrillation) (HCC)   Homelessness   Pressure injury of skin   Hypokalemia   Alcohol  use disorder, severe, dependence (HCC)   1-Sepsis due to cellulitis right BKA - Presents with fever, tachycardia, leukocytosis.  Present cellulitis of the right BKA. - Patient was started on antibiotics on admission subsequently vancomycin  and cefepime  discontinued.  He was transition to doxycycline  to complete 7 days course. Continue local care  PAF: -Continue Lopressor  - Not on anticoagulation    Chronic osteomyelitis: - CT right femur show no acute abnormality and diffuse edema throughout subcutaneous soft tissue. - CT left femur showed chronic osteomyelitis  and mild subcutaneous edema of the distal femur with no soft tissue gas or fluid collection I have asked ID to review and give recommendations   Hypotension: -Does not appear to be worsening sepsis however continues to have recurrent hypotension - Responded to fluid bolus Will give IV fluids  Homelessness: - Will need to ensure meds refilled prior to discharge - Social worker consulted for SNF  Hyponatremia: Resolved Combination of beer potomania and or dehydration.  Alcohol  use disorder, severe Complete her CIWA to call  Hypokalemia Replace as needed  Chest pain Found to be in A-fib on admission, likely had chest pain related to these.    Estimated body mass index is 19.96 kg/m as calculated from the following:   Height as of this encounter: 5' 10 (1.778 m).   Weight as of this encounter: 63.1 kg.   DVT prophylaxis: Lovenox  Code Status: Full code Family Communication: Care discussed with patient Disposition Plan:  Status is: Inpatient Remains inpatient appropriate because: Management of cellulitis, hypotension    Consultants:    Procedures:    Antimicrobials:    Subjective: He has pain in his right stump.  Denies dyspnea.  Denies abdominal pain  Objective: Vitals:   12/27/23 0300 12/27/23 0400 12/27/23 0600 12/27/23 0649  BP:  105/68 100/66   Pulse:  (!) 106    Resp:  11 (!) 22   Temp: 98 F (36.7 C)   99 F (37.2 C)  TempSrc: Oral   Oral  SpO2:  97%    Weight:      Height:        Intake/Output Summary (Last  24 hours) at 12/27/2023 0708 Last data filed at 12/27/2023 0600 Gross per 24 hour  Intake 485 ml  Output 2550 ml  Net -2065 ml   Filed Weights   12/21/23 1501  Weight: 63.1 kg    Examination:  General exam: Appears calm and comfortable  Respiratory system: Clear to auscultation. Respiratory effort normal. Cardiovascular system: S1 & S2 heard, RRR. No JVD, murmurs, rubs, gallops or clicks. No pedal edema. Gastrointestinal  system: Abdomen is nondistended, soft and nontender. No organomegaly or masses felt. Normal bowel sounds heard. Central nervous system: Alert and oriented. No focal neurological deficits. Extremities: Left above-knee amputation, right BKA with dressing. Data Reviewed: I have personally reviewed following labs and imaging studies  CBC: Recent Labs  Lab 12/22/23 0629 12/23/23 0253 12/24/23 0244 12/25/23 0252 12/26/23 0436  WBC 8.6 9.1 8.6 6.8 7.2  NEUTROABS  --  2.9 3.8 2.4 2.8  HGB 11.2* 9.5* 8.4* 8.1* 9.2*  HCT 35.7* 30.4* 26.6* 26.2* 30.8*  MCV 84.6 86.1 84.7 85.6 87.5  PLT 559* 399 336 286 327   Basic Metabolic Panel: Recent Labs  Lab 12/22/23 0629 12/23/23 0253 12/24/23 0244 12/25/23 0252 12/26/23 0436  NA 134* 134* 138 139 138  K 3.0* 3.4* 2.8* 3.8 3.7  CL 100 101 102 106 103  CO2 22 21* 24 24 24   GLUCOSE 111* 122* 138* 114* 108*  BUN <5* <5* <5* <5* <5*  CREATININE 0.44* 0.45* 0.38* 0.36* 0.36*  CALCIUM  7.3* 7.4* 7.6* 8.8* 9.0  MG 1.9 1.9 1.8 1.9 1.7   GFR: Estimated Creatinine Clearance: 92 mL/min (A) (by C-G formula based on SCr of 0.36 mg/dL (L)). Liver Function Tests: Recent Labs  Lab 12/22/23 0629  AST 35  ALT 14  ALKPHOS 75  BILITOT 0.4  PROT 6.5  ALBUMIN  2.5*   No results for input(s): LIPASE, AMYLASE in the last 168 hours. No results for input(s): AMMONIA in the last 168 hours. Coagulation Profile: No results for input(s): INR, PROTIME in the last 168 hours. Cardiac Enzymes: No results for input(s): CKTOTAL, CKMB, CKMBINDEX, TROPONINI in the last 168 hours. BNP (last 3 results) No results for input(s): PROBNP in the last 8760 hours. HbA1C: No results for input(s): HGBA1C in the last 72 hours. CBG: No results for input(s): GLUCAP in the last 168 hours. Lipid Profile: No results for input(s): CHOL, HDL, LDLCALC, TRIG, CHOLHDL, LDLDIRECT in the last 72 hours. Thyroid  Function Tests: No results for  input(s): TSH, T4TOTAL, FREET4, T3FREE, THYROIDAB in the last 72 hours. Anemia Panel: No results for input(s): VITAMINB12, FOLATE, FERRITIN, TIBC, IRON , RETICCTPCT in the last 72 hours. Sepsis Labs: Recent Labs  Lab 12/21/23 1623 12/21/23 1917  LATICACIDVEN 1.6 1.1    Recent Results (from the past 240 hours)  Culture, blood (routine x 2)     Status: None   Collection Time: 12/21/23  4:10 PM   Specimen: BLOOD LEFT FOREARM  Result Value Ref Range Status   Specimen Description   Final    BLOOD LEFT FOREARM Performed at [facility], [address] [place], [place] [address]    Special Requests   Final    BOTTLES DRAWN AEROBIC AND ANAEROBIC Blood Culture adequate volume Performed at [facility], [address], [place], [place] [address]    Culture   Final    NO GROWTH 5 DAYS Performed at [facility], [address] [place], [place] [address]    Report Status 12/26/2023 FINAL  Final  Culture, blood (routine x 2)     Status: None   Collection Time: 12/21/23  4:30 PM   Specimen: BLOOD RIGHT FOREARM  Result Value Ref Range Status   Specimen Description   Final    BLOOD RIGHT FOREARM Performed at [facility], [address], [place], [place] [address]    Special Requests   Final    BOTTLES DRAWN AEROBIC AND ANAEROBIC Blood Culture adequate volume Performed at [facility], [address] [place], [place] [address]    Culture   Final    NO GROWTH 5 DAYS Performed at [facility], [address] [place], [place] [address]    Report Status 12/26/2023 FINAL  Final  Resp panel by RT-PCR (RSV, Flu A&B, Covid) Anterior Nasal Swab     Status: None   Collection Time: 12/21/23  7:17 PM   Specimen: Anterior Nasal Swab  Result Value Ref Range Status   SARS Coronavirus 2 by RT PCR NEGATIVE NEGATIVE Final    Comment: (NOTE) SARS-CoV-2 target nucleic acids are NOT DETECTED.  The SARS-CoV-2 RNA is  generally detectable in upper respiratory specimens during the acute phase of infection. The lowest concentration of SARS-CoV-2 viral copies this assay can detect is 138 copies/mL. A negative result does not preclude SARS-Cov-2 infection and should not be used as the sole basis for treatment or other patient management decisions. A negative result may occur with  improper specimen collection/handling, submission of specimen other than nasopharyngeal swab, presence of viral mutation(s) within the areas targeted by this assay, and inadequate number of viral copies(<138 copies/mL). A negative result must be combined with clinical observations, patient history, and epidemiological information. The expected result is Negative.  Fact Sheet for Patients:  BloggerCourse.com  Fact Sheet for Healthcare Providers:  SeriousBroker.it  This test is no t yet approved or cleared by the [place]  FDA and  has been authorized for detection and/or diagnosis of SARS-CoV-2 by FDA under an Emergency Use Authorization (EUA). This EUA will remain  in effect (meaning this test can be used) for the duration of the COVID-19 declaration under Section 564(b)(1) of the Act, 21 U.S.C.section 360bbb-3(b)(1), unless the authorization is terminated  or revoked sooner.       Influenza A by PCR NEGATIVE NEGATIVE Final   Influenza B by PCR NEGATIVE NEGATIVE Final    Comment: (NOTE) The Xpert Xpress SARS-CoV-2/FLU/RSV plus assay is intended as an aid in the diagnosis of influenza from Nasopharyngeal swab specimens and should not be used as a sole basis for treatment. Nasal washings and aspirates are unacceptable for Xpert Xpress SARS-CoV-2/FLU/RSV testing.  Fact Sheet for Patients: BloggerCourse.com  Fact Sheet for Healthcare Providers: SeriousBroker.it  This test is not yet approved or cleared by the [place] FDA and has been authorized for detection and/or diagnosis of SARS-CoV-2 by FDA under an Emergency Use Authorization (EUA). This EUA will remain in effect (meaning this test can be used) for the duration of the COVID-19 declaration under Section 564(b)(1) of the Act, 21 U.S.C. section 360bbb-3(b)(1), unless the authorization is terminated or revoked.     Resp Syncytial Virus by PCR NEGATIVE NEGATIVE Final    Comment: (NOTE) Fact Sheet for Patients: BloggerCourse.com  Fact Sheet for Healthcare Providers: SeriousBroker.it  This test is not yet approved or cleared by the [place]  FDA and has been authorized for detection and/or diagnosis of SARS-CoV-2 by FDA under an Emergency Use Authorization (EUA). This EUA  will remain in effect (meaning this test can be used) for the duration of the COVID-19 declaration under Section 564(b)(1) of the Act, 21 U.S.C. section 360bbb-3(b)(1), unless the authorization is terminated or revoked.  Performed at [facility], [address], [place], [place] [address]   MRSA Next Gen by PCR, Nasal     Status: None   Collection Time: 12/21/23  9:42 PM   Specimen: Nasal Mucosa; Nasal Swab  Result Value Ref Range Status   MRSA by PCR Next Gen NOT DETECTED NOT DETECTED Final    Comment: (NOTE) The GeneXpert MRSA Assay (FDA approved for NASAL specimens only), is one component of a comprehensive MRSA colonization surveillance program. It is not intended to diagnose MRSA infection nor to guide or monitor treatment for MRSA infections. Test performance is not FDA approved in patients less than 46 years old. Performed at [facility] [address], [place], [place] [address]          Radiology Studies: No results found.      Scheduled Meds:  doxycycline   100 mg Oral Q12H   enoxaparin  (LOVENOX ) injection  40 mg Subcutaneous Q24H   folic acid   1  mg Oral Daily   Gerhardt's butt cream  1 Application Topical TID   hydrocerin   Topical BID   hydrocortisone  cream   Topical QID   metoprolol  tartrate  25 mg Oral BID   mirtazapine   15 mg Oral QHS   multivitamin with minerals  1 tablet Oral Daily   sodium chloride  flush  3 mL Intravenous Q12H   thiamine   100 mg Oral Daily   Or   thiamine   100 mg Intravenous Daily   Continuous Infusions:   LOS: 6 days    Time spent: 35 minutes    Jerrid Forgette A Donyell Carrell, MD [facility]   If 7PM-7AM, please contact night-coverage [email or web address]  12/27/2023, 7:08 AM

## 2023-12-27 NOTE — Plan of Care (Signed)

## 2023-12-28 DIAGNOSIS — L03115 Cellulitis of right lower limb: Secondary | ICD-10-CM

## 2023-12-28 DIAGNOSIS — L0390 Cellulitis, unspecified: Secondary | ICD-10-CM | POA: Diagnosis not present

## 2023-12-28 DIAGNOSIS — I998 Other disorder of circulatory system: Secondary | ICD-10-CM

## 2023-12-28 DIAGNOSIS — A419 Sepsis, unspecified organism: Secondary | ICD-10-CM | POA: Diagnosis not present

## 2023-12-28 DIAGNOSIS — L89210 Pressure ulcer of right hip, unstageable: Secondary | ICD-10-CM | POA: Diagnosis not present

## 2023-12-28 LAB — BASIC METABOLIC PANEL WITH GFR
Anion gap: 10 (ref 5–15)
BUN: <5 mg/dL — ABNORMAL LOW (ref 6–20)
CO2: 26 mmol/L (ref 22–32)
Calcium: 8.8 mg/dL — ABNORMAL LOW (ref 8.9–10.3)
Chloride: 102 mmol/L (ref 98–111)
Creatinine, Ser: 0.37 mg/dL — ABNORMAL LOW (ref 0.61–1.24)
GFR, Estimated: >60 mL/min (ref >60)
Glucose, Bld: 107 mg/dL — ABNORMAL HIGH (ref 70–99)
Potassium: 3.7 mmol/L (ref 3.5–5.1)
Sodium: 138 mmol/L (ref 135–145)

## 2023-12-28 LAB — CBC
HCT: 28.7 % — ABNORMAL LOW (ref 39.0–52.0)
Hemoglobin: 9 g/dL — ABNORMAL LOW (ref 13.0–17.0)
MCH: 26.5 pg (ref 26.0–34.0)
MCHC: 31.4 g/dL (ref 30.0–36.0)
MCV: 84.4 fL (ref 80.0–100.0)
Platelets: 351 K/uL (ref 150–400)
RBC: 3.4 MIL/uL — ABNORMAL LOW (ref 4.22–5.81)
RDW: 15.9 % — ABNORMAL HIGH (ref 11.5–15.5)
WBC: 7.2 K/uL (ref 4.0–10.5)
nRBC: 0 % (ref 0.0–0.2)

## 2023-12-28 MED ORDER — ALBUMIN HUMAN 25 % IV SOLN
12.5000 g | Freq: Four times a day (QID) | INTRAVENOUS | Status: AC
  Administered 2023-12-28 – 2023-12-29 (×3): 12.5 g via INTRAVENOUS
  Filled 2023-12-28 (×3): qty 50

## 2023-12-28 NOTE — Progress Notes (Signed)
 Occupational Therapy Treatment Patient Details Name: Jake Nguyen MRN: [id number] DOB: 1966-03-20 Today's Date: 12/28/2023   History of present illness Jake Nguyen is a 58 year old male who presented with chest pain and bilateral leg pain dx with sepsis, acute cellulitis, Afib with RVR.  PMH homelessness, alcohol  abuse, depression/anxiety, chronic osteomyelitis, left AKA 2022, right BKA May 2025, A-fib.   OT comments  Patient seen for skilled OT session. Patient open to therapy however made it apparent his pain remained high from wound care and EOB mobility previous session. See below for response to light activity progression. OT reinforced importance of trial OOB next session with patient agreeable and therapy will coordinate for pre-medication as needed and if able. Patient requires continued Acute care hospital level OT services to progress safety and functional performance and allow for discharge. Patient will benefit from continued inpatient follow up therapy, <3 hours/day        If plan is discharge home, recommend the following:  A lot of help with bathing/dressing/bathroom;Assistance with cooking/housework;Assist for transportation;Help with stairs or ramp for entrance;Supervision due to cognitive status   Equipment Recommendations  Other (comment) (skin protection w/c cushion)       Precautions / Restrictions Precautions Precautions: Fall;Other (comment) Recall of Precautions/Restrictions: Impaired Precaution/Restrictions Comments: excoriation wounds posterior  right thigh and buttocks Restrictions Weight Bearing Restrictions Per Provider Order: No RLE Weight Bearing Per Provider Order: Non weight bearing Other Position/Activity Restrictions: R BKA, L AKA       Mobility Bed Mobility Overal bed mobility: Needs Assistance Bed Mobility: Rolling, Supine to Sit, Sit to Supine Rolling: Independent, Used rails         General bed mobility comments: chair position  utilized as patient declined EOB reporting too much pain from dressing changes    Transfers                   General transfer comment: pt declined, reinforced need to trial next session     Balance                                           ADL either performed or assessed with clinical judgement   ADL Overall ADL's : Needs assistance/impaired     Grooming: Bed level (applied lotion to chest/UE's in chair position of bed)                               Functional mobility during ADLs: Maximal assistance General ADL Comments: declined OOB due to pain but agreeable to bed level ther act and light skin care    Extremity/Trunk Assessment Upper Extremity Assessment Upper Extremity Assessment: Generalized weakness   Lower Extremity Assessment Lower Extremity Assessment: Defer to PT evaluation        Vision   Vision Assessment?: No apparent visual deficits         Communication Communication Communication: No apparent difficulties   Cognition Arousal: Alert Behavior During Therapy: WFL for tasks assessed/performed Cognition: Cognition impaired   Orientation impairments: Time, Situation Awareness: Intellectual awareness impaired, Online awareness impaired Memory impairment (select all impairments): Short-term memory Attention impairment (select first level of impairment): Sustained attention Executive functioning impairment (select all impairments): Initiation, Organization, Sequencing, Reasoning, Problem solving OT - Cognition Comments: most likely near baseline however significantly off on month/season and has limited healthcare literacy  and insight                 Following commands: Intact        Cueing   Cueing Techniques: Verbal cues        General Comments no SOB, HR increases with light UE target balloon toss 10 min with 4 intervals and rest between 108-120 bpm    Pertinent Vitals/ Pain       Pain  Assessment Pain Assessment: Faces Faces Pain Scale: Hurts even more Facial Expression: Tense Body Movements: Protection Muscle Tension: Tense, rigid Compliance with ventilator (intubated pts.): N/A Vocalization (extubated pts.): Talking in normal tone or no sound CPOT Total: 3 Pain Location: B LE's Pain Descriptors / Indicators: Discomfort, Guarding Pain Intervention(s): Monitored during session, Limited activity within patient's tolerance   Frequency  Min 1X/week        Progress Toward Goals  OT Goals(current goals can now be found in the care plan section)  Progress towards OT goals: Progressing toward goals  Acute Rehab OT Goals Patient Stated Goal: to heal OT Goal Formulation: With patient Time For Goal Achievement: 01/08/24 Potential to Achieve Goals: Fair ADL Goals Pt Will Perform Upper Body Bathing: with set-up;sitting Pt Will Perform Lower Body Bathing: with min assist;bed level Pt Will Perform Lower Body Dressing: with min assist;bed level Pt Will Transfer to Toilet: bedside commode;with mod assist Pt Will Perform Toileting - Clothing Manipulation and hygiene: with mod assist;sitting/lateral leans Pt/caregiver will Perform Home Exercise Program: Increased strength;Both right and left upper extremity;With written HEP provided Additional ADL Goal #1: Patient will teach back pressure releif, skin integrity and positioning strategies with min cues  Plan         AM-PAC OT 6 Clicks Daily Activity     Outcome Measure   Help from another person eating meals?: A Little Help from another person taking care of personal grooming?: A Little Help from another person toileting, which includes using toliet, bedpan, or urinal?: A Lot Help from another person bathing (including washing, rinsing, drying)?: A Lot Help from another person to put on and taking off regular upper body clothing?: A Lot Help from another person to put on and taking off regular lower body clothing?:  Total 6 Click Score: 13    End of Session Equipment Utilized During Treatment: Other (comment) (bed level)  OT Visit Diagnosis: Other abnormalities of gait and mobility (R26.89);History of falling (Z91.81);Muscle weakness (generalized) (M62.81);Pain   Activity Tolerance Patient limited by pain   Patient Left in bed;with call bell/phone within reach;with bed alarm set   Nurse Communication Mobility status        Time: 8556-8494 OT Time Calculation (min): 22 min  Charges: OT General Charges $OT Visit: 1 Visit OT Treatments $Therapeutic Activity: 8-22 mins  Artice Bergerson OT/L Acute Rehabilitation Department  [phone]  12/28/2023, 4:09 PM

## 2023-12-28 NOTE — Plan of Care (Signed)
  Problem: Fluid Volume: Goal: Hemodynamic stability will improve Outcome: Progressing   Problem: Clinical Measurements: Goal: Diagnostic test results will improve Outcome: Progressing Goal: Signs and symptoms of infection will decrease Outcome: Progressing   Problem: Respiratory: Goal: Ability to maintain adequate ventilation will improve Outcome: Progressing   Problem: Education: Goal: Knowledge of General Education information will improve Description: Including pain rating scale, medication(s)/side effects and non-pharmacologic comfort measures Outcome: Progressing   Problem: Health Behavior/Discharge Planning: Goal: Ability to manage health-related needs will improve Outcome: Progressing   Problem: Clinical Measurements: Goal: Ability to maintain clinical measurements within normal limits will improve Outcome: Progressing Goal: Will remain free from infection Outcome: Progressing Goal: Diagnostic test results will improve Outcome: Progressing   Problem: Activity: Goal: Risk for activity intolerance will decrease Outcome: Progressing   Problem: Pain Managment: Goal: General experience of comfort will improve and/or be controlled Outcome: Progressing

## 2023-12-28 NOTE — Progress Notes (Signed)
 PROGRESS NOTE    Jake Nguyen  FMW:[id number] DOB: 1966-01-29 DOA: 12/21/2023 PCP: Freddrick, No   Brief Narrative: 58 year old with past medical history significant for homelessness, alcohol  abuse, depression/anxiety, chronic osteomyelitis, left AKA 2022, right BKA May 2025, A-fib presents with chest pain and bilateral leg pain.  Patient was discharged from hospital 12/12/2023 after treatment for acute on chronic hyponatremia, rapid A-fib and lactic acidosis.  He was recommended that he will go to a SNF on discharge but patient refused.  Patient was picked up by EMS with the same paper scrubs he was discharged in over a week ago, he was soiled in urine and feces other into his skin.  He has been having episode of sharp chest pain and companied by palpitation.  Presented with hyponatremia sodium 126, white blood cell 12 systolic blood pressure between 90s and 110s.  CT femur showed no acute abnormality and diffuse edema throughout the subcutaneous soft tissue.  CT left femur showed chronic osteomyelitis and mild subcutaneous edema of the distal femur with no soft tissue gas or fluid collection.     Assessment & Plan:   Principal Problem:   Sepsis due to cellulitis [facility]) Active Problems:   Chronic osteomyelitis (HCC)   PAF (paroxysmal atrial fibrillation) (HCC)   Homelessness   Pressure injury of skin   Hypokalemia   Alcohol  use disorder, severe, dependence (HCC)   1-Sepsis due to Cellulitis Right BKA - Presents with fever, tachycardia, leukocytosis.  Present cellulitis of the right BKA. - Patient was started on antibiotics on admission subsequently vancomycin  and cefepime  discontinued.  He was transition to doxycycline , continue for now Continue local care Dr Harden consulted.  Continue with IV fluids.   PAF: -Continue Lopressor  - Not on anticoagulation   Chronic osteomyelitis: - CT right femur show no acute abnormality and diffuse edema throughout subcutaneous soft tissue. - CT  left femur showed chronic osteomyelitis and mild subcutaneous edema of the distal femur with no soft tissue gas or fluid collection -Dr Harden consulted.   Hypotension: -Does not appear to be worsening sepsis however continues to have recurrent hypotension - Responded to fluid bolus Continue with  IV fluids  Homelessness: - Will need to ensure meds refilled prior to discharge - Social worker consulted for SNF  Hyponatremia: Resolved Combination of beer potomania and or dehydration.  Alcohol  use disorder, severe Complete her CIWA to call  Hypokalemia Replace as needed  Chest pain Found to be in A-fib on admission, likely had chest pain related to these.    Estimated body mass index is 19.96 kg/m as calculated from the following:   Height as of this encounter: 5' 10 (1.778 m).   Weight as of this encounter: 63.1 kg.   DVT prophylaxis: Lovenox  Code Status: Full code Family Communication: Care discussed with patient Disposition Plan:  Status is: Inpatient Remains inpatient appropriate because: Management of cellulitis, hypotension    Consultants:    Procedures:    Antimicrobials:    Subjective: He is feeling well, no new complaints.  Getting daily dressing changes.   Objective: Vitals:   12/28/23 0300 12/28/23 0400 12/28/23 0500 12/28/23 0700  BP:  105/64  112/87  Pulse: 98 98 97 92  Resp: 16 15 15 12   Temp:  98.5 F (36.9 C)    TempSrc:  Oral    SpO2: 97% 97% 96% 99%  Weight:      Height:        Intake/Output Summary (Last 24 hours) at 12/28/2023 [phone]  Last data filed at 12/28/2023 0125 Gross per 24 hour  Intake 1725.23 ml  Output 1800 ml  Net -74.77 ml   Filed Weights   12/21/23 1501  Weight: 63.1 kg    Examination:  General exam: NAD Respiratory system: CTA Cardiovascular system: S 1, S 2 RRR Gastrointestinal system: BS present, soft, nt Central nervous system: alert, conversant Extremities: Left above-knee amputation, right BKA with  dressing.    Data Reviewed: I have personally reviewed following labs and imaging studies  CBC: Recent Labs  Lab 12/22/23 0629 12/23/23 0253 12/24/23 0244 12/25/23 0252 12/26/23 0436  WBC 8.6 9.1 8.6 6.8 7.2  NEUTROABS  --  2.9 3.8 2.4 2.8  HGB 11.2* 9.5* 8.4* 8.1* 9.2*  HCT 35.7* 30.4* 26.6* 26.2* 30.8*  MCV 84.6 86.1 84.7 85.6 87.5  PLT 559* 399 336 286 327   Basic Metabolic Panel: Recent Labs  Lab 12/22/23 0629 12/23/23 0253 12/24/23 0244 12/25/23 0252 12/26/23 0436  NA 134* 134* 138 139 138  K 3.0* 3.4* 2.8* 3.8 3.7  CL 100 101 102 106 103  CO2 22 21* 24 24 24   GLUCOSE 111* 122* 138* 114* 108*  BUN <5* <5* <5* <5* <5*  CREATININE 0.44* 0.45* 0.38* 0.36* 0.36*  CALCIUM  7.3* 7.4* 7.6* 8.8* 9.0  MG 1.9 1.9 1.8 1.9 1.7   GFR: Estimated Creatinine Clearance: 92 mL/min (A) (by C-G formula based on SCr of 0.36 mg/dL (L)). Liver Function Tests: Recent Labs  Lab 12/22/23 0629  AST 35  ALT 14  ALKPHOS 75  BILITOT 0.4  PROT 6.5  ALBUMIN  2.5*   No results for input(s): LIPASE, AMYLASE in the last 168 hours. No results for input(s): AMMONIA in the last 168 hours. Coagulation Profile: No results for input(s): INR, PROTIME in the last 168 hours. Cardiac Enzymes: No results for input(s): CKTOTAL, CKMB, CKMBINDEX, TROPONINI in the last 168 hours. BNP (last 3 results) No results for input(s): PROBNP in the last 8760 hours. HbA1C: No results for input(s): HGBA1C in the last 72 hours. CBG: No results for input(s): GLUCAP in the last 168 hours. Lipid Profile: No results for input(s): CHOL, HDL, LDLCALC, TRIG, CHOLHDL, LDLDIRECT in the last 72 hours. Thyroid  Function Tests: No results for input(s): TSH, T4TOTAL, FREET4, T3FREE, THYROIDAB in the last 72 hours. Anemia Panel: No results for input(s): VITAMINB12, FOLATE, FERRITIN, TIBC, IRON , RETICCTPCT in the last 72 hours. Sepsis Labs: Recent Labs  Lab  12/21/23 1623 12/21/23 1917  LATICACIDVEN 1.6 1.1    Recent Results (from the past 240 hours)  Culture, blood (routine x 2)     Status: None   Collection Time: 12/21/23  4:10 PM   Specimen: BLOOD LEFT FOREARM  Result Value Ref Range Status   Specimen Description   Final    BLOOD LEFT FOREARM Performed at [facility], [address] [place], [place] [address]    Special Requests   Final    BOTTLES DRAWN AEROBIC AND ANAEROBIC Blood Culture adequate volume Performed at [facility], [address] [place], [place] [address]    Culture   Final    NO GROWTH 5 DAYS Performed at [facility], [address], [place], [place] [address]    Report Status 12/26/2023 FINAL  Final  Culture, blood (routine x 2)     Status: None   Collection Time: 12/21/23  4:30 PM   Specimen: BLOOD RIGHT FOREARM  Result Value Ref Range Status   Specimen Description  Final    BLOOD RIGHT FOREARM Performed at [facility], [address] [place], [place] [address]    Special Requests   Final    BOTTLES DRAWN AEROBIC AND ANAEROBIC Blood Culture adequate volume Performed at [facility], [address], [place], [place] [address]    Culture   Final    NO GROWTH 5 DAYS Performed at [facility], [address] [place], [place] [address]    Report Status 12/26/2023 FINAL  Final  Resp panel by RT-PCR (RSV, Flu A&B, Covid) Anterior Nasal Swab     Status: None   Collection Time: 12/21/23  7:17 PM   Specimen: Anterior Nasal Swab  Result Value Ref Range Status   SARS Coronavirus 2 by RT PCR NEGATIVE NEGATIVE Final    Comment: (NOTE) SARS-CoV-2 target nucleic acids are NOT DETECTED.  The SARS-CoV-2 RNA is generally detectable in upper respiratory specimens during the acute phase of infection. The lowest concentration of SARS-CoV-2 viral copies this assay can detect is 138 copies/mL. A negative result does not preclude SARS-Cov-2 infection  and should not be used as the sole basis for treatment or other patient management decisions. A negative result may occur with  improper specimen collection/handling, submission of specimen other than nasopharyngeal swab, presence of viral mutation(s) within the areas targeted by this assay, and inadequate number of viral copies(<138 copies/mL). A negative result must be combined with clinical observations, patient history, and epidemiological information. The expected result is Negative.  Fact Sheet for Patients:  BloggerCourse.com  Fact Sheet for Healthcare Providers:  SeriousBroker.it  This test is no t yet approved or cleared by the [place]  FDA and  has been authorized for detection and/or diagnosis of SARS-CoV-2 by FDA under an Emergency Use Authorization (EUA). This EUA will remain  in effect (meaning this test can be used) for the duration of the COVID-19 declaration under Section 564(b)(1) of the Act, 21 U.S.C.section 360bbb-3(b)(1), unless the authorization is terminated  or revoked sooner.       Influenza A by PCR NEGATIVE NEGATIVE Final   Influenza B by PCR NEGATIVE NEGATIVE Final    Comment: (NOTE) The Xpert Xpress SARS-CoV-2/FLU/RSV plus assay is intended as an aid in the diagnosis of influenza from Nasopharyngeal swab specimens and should not be used as a sole basis for treatment. Nasal washings and aspirates are unacceptable for Xpert Xpress SARS-CoV-2/FLU/RSV testing.  Fact Sheet for Patients: BloggerCourse.com  Fact Sheet for Healthcare Providers: SeriousBroker.it  This test is not yet approved or cleared by the [place]  FDA and has been authorized for detection and/or diagnosis of SARS-CoV-2 by FDA under an Emergency Use Authorization (EUA). This EUA will remain in effect (meaning this test can be used) for the duration of the COVID-19 declaration  under Section 564(b)(1) of the Act, 21 U.S.C. section 360bbb-3(b)(1), unless the authorization is terminated or revoked.     Resp Syncytial Virus by PCR NEGATIVE NEGATIVE Final    Comment: (NOTE) Fact Sheet for Patients: BloggerCourse.com  Fact Sheet for Healthcare Providers: SeriousBroker.it  This test is not yet approved or cleared by the [place]  FDA and has been authorized for detection and/or diagnosis of SARS-CoV-2 by FDA under an Emergency Use Authorization (EUA). This EUA will remain in effect (meaning this test can be used) for the duration of the COVID-19 declaration under Section 564(b)(1) of the Act, 21 U.S.C. section 360bbb-3(b)(1), unless the authorization is terminated or revoked.  Performed at [facility], [address] [place], [place] [address]   MRSA Next Gen by PCR, Nasal     Status: None   Collection Time: 12/21/23  9:42 PM   Specimen: Nasal Mucosa; Nasal Swab  Result Value Ref Range Status   MRSA by PCR Next Gen NOT DETECTED NOT DETECTED Final    Comment: (NOTE) The GeneXpert MRSA Assay (FDA approved for NASAL specimens only), is one component of a comprehensive MRSA colonization surveillance program. It is not intended to diagnose MRSA infection nor to guide or monitor treatment for MRSA infections. Test performance is not FDA approved in patients less than 54 years old. Performed at [facility], [address] [place], [place] [address]          Radiology Studies: No results found.      Scheduled Meds:  doxycycline   100 mg Oral Q12H   enoxaparin  (LOVENOX ) injection  40 mg Subcutaneous Q24H   folic acid   1 mg Oral Daily   Gerhardt's butt cream  1 Application Topical TID   hydrocerin   Topical BID   hydrocortisone  cream   Topical QID   metoprolol  tartrate  25 mg Oral BID   mirtazapine   15 mg Oral QHS   multivitamin with minerals  1 tablet  Oral Daily   sodium chloride  flush  3 mL Intravenous Q12H   thiamine   100 mg Oral Daily   Or   thiamine   100 mg Intravenous Daily   Continuous Infusions:  lactated ringers  75 mL/hr at 12/28/23 0334     LOS: 7 days    Time spent: 35 minutes    Antanasia Kaczynski A Laquetta Racey, MD [facility]   If 7PM-7AM, please contact night-coverage [email or web address]  12/28/2023, 7:21 AM

## 2023-12-28 NOTE — Consult Note (Signed)
 ORTHOPAEDIC CONSULTATION  REQUESTING PHYSICIAN: Madelyne Owen LABOR, MD  Chief Complaint: Ischemic ulcerations right lower extremity.  HPI: Jake Nguyen is a 58 y.o. male who presents with progressive ischemic ulcerations posterior aspect right thigh and ischemic ulcerations to the right transtibial amputation.  Past Medical History:  Diagnosis Date   Acute metabolic encephalopathy 11/14/2020   Alcohol  abuse with alcohol -induced mood disorder (HCC) 11/10/2019   Alcohol  withdrawal syndrome, with delirium (HCC) 11/06/2019   Alcoholism (HCC)    Anxiety    Depression    Influenza A 05/20/2018   Sepsis (HCC) 11/14/2020   Septic shock (HCC) 11/24/2020   Past Surgical History:  Procedure Laterality Date   ABDOMINAL AORTOGRAM W/LOWER EXTREMITY N/A 08/04/2020   Procedure: ABDOMINAL AORTOGRAM W/LOWER EXTREMITY;  Surgeon: Serene Gaile ORN, MD;  Location: [facility] INVASIVE CV LAB;  Service: Cardiovascular;  Laterality: N/A;   AMPUTATION Left 08/05/2020   Procedure: AMPUTATION BELOW KNEE;  Surgeon: Serene Gaile ORN, MD;  Location: [facility] OR;  Service: Vascular;  Laterality: Left;   AMPUTATION Left 11/21/2020   Procedure: REVISION LEFT ABOVE KNEE AMPUTATION;  Surgeon: Serene Gaile ORN, MD;  Location: [facility] OR;  Service: Vascular;  Laterality: Left;   AMPUTATION Right 08/20/2023   Procedure: AMPUTATION BELOW KNEE;  Surgeon: Harden Jerona GAILS, MD;  Location: [facility] OR;  Service: Orthopedics;  Laterality: Right;   APPLICATION OF WOUND VAC Right 08/20/2023   Procedure: APPLICATION, WOUND VAC;  Surgeon: Harden Jerona GAILS, MD;  Location: [facility] OR;  Service: Orthopedics;  Laterality: Right;   Social History   Socioeconomic History   Marital status: Single    Spouse name: Not on file   Number of children: Not on file   Years of education: Not on file   Highest education level: Not on file  Occupational History   Not on file  Tobacco Use   Smoking status: Every Day    Current packs/day: 0.50    Types:  Cigarettes   Smokeless tobacco: Never  Substance and Sexual Activity   Alcohol  use: Yes    Comment: 6 pack perday   Drug use: Yes    Types: Marijuana   Sexual activity: Not Currently  Other Topics Concern   Not on file  Social History Narrative   Not on file   Social Drivers of Health   Financial Resource Strain: Not on file  Food Insecurity: Patient Unable To Answer (12/21/2023)   Hunger Vital Sign    Worried About Running Out of Food in the Last Year: Patient unable to answer    Ran Out of Food in the Last Year: Patient unable to answer  Recent Concern: Food Insecurity - Food Insecurity Present (12/05/2023)   Hunger Vital Sign    Worried About Running Out of Food in the Last Year: Sometimes true    Ran Out of Food in the Last Year: Sometimes true  Transportation Needs: Patient Unable To Answer (12/21/2023)   PRAPARE - Transportation    Lack of Transportation (Medical): Patient unable to answer    Lack of Transportation (Non-Medical): Patient unable to answer  Recent Concern: Transportation Needs - Unmet Transportation Needs (12/05/2023)   PRAPARE - Administrator, Civil Service (Medical): Yes    Lack of Transportation (Non-Medical): Yes  Physical Activity: Not on file  Stress: Not on file  Social Connections: Socially Isolated (07/20/2023)   Social Connection and Isolation Panel    Frequency of Communication with Friends and Family: Never    Frequency  of Social Gatherings with Friends and Family: Never    Attends Religious Services: Never    Database administrator or Organizations: No    Attends Engineer, structural: Never    Marital Status: Never married   Family History  Family history unknown: Yes   - negative except otherwise stated in the family history section Allergies  Allergen Reactions   Bactoshield Chg [Chlorhexidine  Gluconate] Itching   Pork-Derived Products Other (See Comments)    Pt states he doesn't eat pork   Prior to Admission  medications   Medication Sig Start Date End Date Taking? Authorizing Provider  docusate sodium  (COLACE) 100 MG capsule Take 1 capsule (100 mg total) by mouth 2 (two) times daily as needed for mild constipation. 12/12/23   Leotis Bogus, MD  folic acid  (FOLVITE ) 1 MG tablet Take 1 tablet (1 mg total) by mouth daily. 12/13/23 01/12/24  Leotis Bogus, MD  metoprolol  tartrate (LOPRESSOR ) 25 MG tablet Take 0.5 tablets (12.5 mg total) by mouth 2 (two) times daily. 12/12/23 01/11/24  Leotis Bogus, MD  mirtazapine  (REMERON ) 15 MG tablet Take 1 tablet (15 mg total) by mouth at bedtime. 12/12/23 01/11/24  Leotis Bogus, MD  polyethylene glycol powder (GLYCOLAX /MIRALAX ) 17 GM/SCOOP powder Dissolve 1 capful (17 g) into liquid as directed and take by mouth daily 12/13/23   Leotis Bogus, MD  thiamine  (VITAMIN B1) 100 MG tablet Take 1 tablet (100 mg total) by mouth daily. 12/13/23 01/12/24  Leotis Bogus, MD  traZODone  (DESYREL ) 50 MG tablet Take 1 tablet (50 mg total) by mouth at bedtime as needed for sleep (for difficulty initiating sleep in patients with depressive symptoms). 12/12/23 01/11/24  Leotis Bogus, MD   No results found. - pertinent xrays, CT, MRI studies were reviewed and independently interpreted  Positive ROS: All other systems have been reviewed and were otherwise negative with the exception of those mentioned in the HPI and as above.  Physical Exam: General: Alert, no acute distress Psychiatric: Patient is competent for consent with normal mood and affect Lymphatic: No axillary or cervical lymphadenopathy Cardiovascular: No pedal edema Respiratory: No cyanosis, no use of accessory musculature GI: No organomegaly, abdomen is soft and non-tender    Images:  @ENCIMAGES @  Labs:  Lab Results  Component Value Date   HGBA1C 5.6 07/20/2023   HGBA1C 6.6 (H) 07/31/2020   HGBA1C 6.9 (H) 05/23/2018   ESRSEDRATE 30 (H) 09/21/2023   ESRSEDRATE 29 (H) 09/04/2023   ESRSEDRATE 2 07/20/2023    CRP 2.1 (H) 09/21/2023   CRP 0.5 09/04/2023   CRP 10.6 (H) 08/23/2023   REPTSTATUS 12/26/2023 FINAL 12/21/2023   CULT  12/21/2023    NO GROWTH 5 DAYS Performed at [facility], [address] [place], [place] [address]    LABORGA ACINETOBACTER CALCOACETICUS/BAUMANNII COMPLEX 05/20/2018    Lab Results  Component Value Date   ALBUMIN  2.5 (L) 12/22/2023   ALBUMIN  2.0 (L) 12/05/2023   ALBUMIN  2.6 (L) 12/03/2023        Latest Ref Rng & Units 12/28/2023    7:48 AM 12/26/2023    4:36 AM 12/25/2023    2:52 AM  CBC EXTENDED  WBC 4.0 - 10.5 K/uL 7.2  7.2  6.8   RBC 4.22 - 5.81 MIL/uL 3.40  3.52  3.06   Hemoglobin 13.0 - 17.0 g/dL 9.0  9.2  8.1   HCT 60.9 - 52.0 % 28.7  30.8  26.2   Platelets 150 - 400 K/uL 351  327  286   NEUT# 1.7 - 7.7 K/uL  2.8  2.4   Lymph# 0.7 - 4.0 K/uL  2.6  2.4     Neurologic: Patient does not have protective sensation bilateral lower extremities.   MUSCULOSKELETAL:   Skin: Examination patient has extensive ischemic ulcers over the entire posterior aspect of the right thigh.  There is also ischemic ulcerations to the residual limb right transtibial amputation.  These are superficial with flat healthy granulation tissue.  There is no tunneling ulcer no clinical signs of deep bone infection.  His left above-knee amputation is well-healed.  Hemoglobin 9.0 with a white cell count of 7.2.  Sed rate in June was 30 CRP in June was 2.1.  Patient does have ischemic pain in the residual limb on the right and pain in the posterior aspect of the right thigh.  Review of the CT scan of the right femur shows no abscesses no bony abnormalities.  CT scan of the left femur shows no abscesses no bony abnormalities.  Assessment: Assessment: Progressive ischemic changes to the right posterior thigh and right transtibial amputation.  Plan: Plan: Would continue with routine wound care.  Discussed with the patient that if his pain in the residual limb on the right  increased or the ischemic changes got worse we could proceed with above-knee amputation on the right however with the patient's ischemic skin changes in the right posterior thigh I think patient would have a high likelihood of this wound dehiscing and not healing.  I favor conservative wound care at this time with pressure offloading.  Thank you for the consult and the opportunity to see Mr. Lotus Gover, MD [facility] [phone] 5:19 PM

## 2023-12-29 DIAGNOSIS — A419 Sepsis, unspecified organism: Secondary | ICD-10-CM | POA: Diagnosis not present

## 2023-12-29 DIAGNOSIS — L0390 Cellulitis, unspecified: Secondary | ICD-10-CM | POA: Diagnosis not present

## 2023-12-29 MED ORDER — ADULT MULTIVITAMIN W/MINERALS CH: 1.0000 | ORAL_TABLET | Freq: Every day | ORAL | 0 refills | Status: AC

## 2023-12-29 MED ORDER — METOPROLOL TARTRATE 25 MG PO TABS: 25.0000 mg | ORAL_TABLET | Freq: Two times a day (BID) | ORAL | 0 refills | Status: AC

## 2023-12-29 MED ORDER — DOXYCYCLINE HYCLATE 100 MG PO TABS: 100.0000 mg | ORAL_TABLET | Freq: Two times a day (BID) | ORAL | 0 refills | Status: AC

## 2023-12-29 MED ORDER — TRAZODONE HCL 50 MG PO TABS: 50.0000 mg | ORAL_TABLET | Freq: Every evening | ORAL | 0 refills | Status: AC | PRN

## 2023-12-29 MED ORDER — OXYCODONE HCL 5 MG PO TABS: 5.0000 mg | ORAL_TABLET | ORAL | 0 refills | Status: AC | PRN

## 2023-12-29 NOTE — Discharge Summary (Signed)
 Physician Discharge Summary   Patient: Jake Nguyen MRN: [id number] DOB: [birth year]  Admit date:     12/21/2023  Discharge date: 12/29/23  Discharge Physician: Owen DELENA Lore   PCP: Pcp, No   Recommendations at discharge:    Needs daily  wound care, see AVS Needs follow up with Dr Harden.  Needs Ensure TID  Discharge Diagnoses: Principal Problem:   Sepsis due to cellulitis [facility]) Active Problems:   Chronic osteomyelitis (HCC)   PAF (paroxysmal atrial fibrillation) (HCC)   Homelessness   Pressure injury of skin   Hypokalemia   Alcohol  use disorder, severe, dependence (HCC)   Ischemia of right lower extremity  Resolved Problems:   Hypotension   Hyponatremia   Chest pain  Hospital Course: 58 year old with past medical history significant for homelessness, alcohol  abuse, depression/anxiety, chronic osteomyelitis, left AKA 2022, right BKA May 2025, A-fib presents with chest pain and bilateral leg pain.  Patient was discharged from hospital 12/12/2023 after treatment for acute on chronic hyponatremia, rapid A-fib and lactic acidosis.  He was recommended that he will go to a SNF on discharge but patient refused.  Patient was picked up by EMS with the same paper scrubs he was discharged in over a week ago, he was soiled in urine and feces other into his skin.  He has been having episode of sharp chest pain and companied by palpitation.   Presented with hyponatremia sodium 126, white blood cell 12 systolic blood pressure between 90s and 110s.  CT femur showed no acute abnormality and diffuse edema throughout the subcutaneous soft tissue.  CT left femur showed chronic osteomyelitis and mild subcutaneous edema of the distal femur with no soft tissue gas or fluid collection.        Assessment and Plan: 1-Sepsis due to Cellulitis Right BKA - Presents with fever, tachycardia, leukocytosis.  Present cellulitis of the right BKA. - Patient was started on antibiotics on admission  subsequently vancomycin  and cefepime  discontinued.  He was transition to doxycycline , complete 10 days.  Continue local care Dr Harden consulted. Recommend wound care Treated  with IV fluids.    PAF: -Continue Lopressor  - Not on anticoagulation     Chronic osteomyelitis: - CT right femur show no acute abnormality and diffuse edema throughout subcutaneous soft tissue. - CT left femur showed chronic osteomyelitis and mild subcutaneous edema of the distal femur with no soft tissue gas or fluid collection -Dr Harden consulted. No need for further tx   Hypotension: -Does not appear to be worsening sepsis however continues to have recurrent hypotension - Responded to fluid bolus and albumin . Received with  IV fluids   Homelessness: - Will need to ensure meds refilled prior to discharge - Social worker consulted for SNF   Hyponatremia: Resolved Combination of beer potomania and or dehydration.   Alcohol  use disorder, severe Complete her CIWA to call   Hypokalemia Replace as needed   Chest pain Found to be in A-fib on admission, likely had chest pain related to these.       Estimated body mass index is 19.96 kg/m as calculated from the following:   Height as of this encounter: 5' 10 (1.778 m).   Weight as of this encounter: 63.1 kg.         Consultants: Dr Harden Procedures performed: none Disposition: Skilled nursing facility Diet recommendation:  Discharge Diet Orders (From admission, onward)     Start     Ordered   12/29/23 0000  Diet - low  sodium heart healthy        12/29/23 1147           Regular diet DISCHARGE MEDICATION: Allergies as of 12/29/2023       Reactions   Bactoshield Chg [chlorhexidine  Gluconate] Itching   Pork-derived Products Other (See Comments)   Pt states he doesn't eat pork        Medication List     TAKE these medications    docusate sodium  100 MG capsule Commonly known as: COLACE Take 1 capsule (100 mg total) by mouth 2  (two) times daily as needed for mild constipation.   doxycycline  100 MG tablet Commonly known as: VIBRA -TABS Take 1 tablet (100 mg total) by mouth every 12 (twelve) hours for 3 days.   folic acid  1 MG tablet Commonly known as: FOLVITE  Take 1 tablet (1 mg total) by mouth daily.   metoprolol  tartrate 25 MG tablet Commonly known as: LOPRESSOR  Take 1 tablet (25 mg total) by mouth 2 (two) times daily. What changed: how much to take   mirtazapine  15 MG tablet Commonly known as: REMERON  Take 1 tablet (15 mg total) by mouth at bedtime.   multivitamin with minerals Tabs tablet Take 1 tablet by mouth daily. Start taking on: December 30, 2023   oxyCODONE  5 MG immediate release tablet Commonly known as: Oxy IR/ROXICODONE  Take 1 tablet (5 mg total) by mouth every 4 (four) hours as needed for moderate pain (pain score 4-6).   polyethylene glycol powder 17 GM/SCOOP powder Commonly known as: GLYCOLAX /MIRALAX  Dissolve 1 capful (17 g) into liquid as directed and take by mouth daily   thiamine  100 MG tablet Commonly known as: VITAMIN B1 Take 1 tablet (100 mg total) by mouth daily.   traZODone  50 MG tablet Commonly known as: DESYREL  Take 1 tablet (50 mg total) by mouth at bedtime as needed for sleep (for difficulty initiating sleep in patients with depressive symptoms).               Discharge Care Instructions  (From admission, onward)           Start     Ordered   12/29/23 0000  Discharge wound care:       Comments: See above   12/29/23 1147            Contact information for after-discharge care     Destination     [facility] .   Service: Skilled Nursing Contact information: [address] [place]  [address] [phone]                    Discharge Exam: Fredricka Weights   12/21/23 1501  Weight: 63.1 kg   General; NAD  Condition at discharge: stable  The results of significant diagnostics from this  hospitalization (including imaging, microbiology, ancillary and laboratory) are listed below for reference.   Imaging Studies: CT CHEST ABDOMEN PELVIS W CONTRAST Result Date: 12/21/2023 CLINICAL DATA:  Sepsis. EXAM: CT CHEST, ABDOMEN, AND PELVIS WITH CONTRAST TECHNIQUE: Multidetector CT imaging of the chest, abdomen and pelvis was performed following the standard protocol during bolus administration of intravenous contrast. RADIATION DOSE REDUCTION: This exam was performed according to the departmental dose-optimization program which includes automated exposure control, adjustment of the mA and/or kV according to patient size and/or use of iterative reconstruction technique. CONTRAST:  OMNIPAQUE  IOHEXOL  300 MG/ML  SOLN COMPARISON:  CT abdomen pelvis dated 12/09/2023. FINDINGS: CT CHEST FINDINGS Cardiovascular: There is no cardiomegaly or pericardial  effusion. The thoracic aorta is unremarkable. The central pulmonary arteries appear patent. Mediastinum/Nodes: No hilar or mediastinal adenopathy. The esophagus is grossly unremarkable. No mediastinal fluid collection. Lungs/Pleura: Background of centrilobular emphysema. No focal consolidation, pleural effusion, pneumothorax. The central airways are patent. Musculoskeletal: No acute osseous pathology. CT ABDOMEN PELVIS FINDINGS No intra-abdominal free air or free fluid. Hepatobiliary: Fatty liver. No biliary dilatation. Gallstone. No pericholecystic fluid or evidence of acute cholecystitis by CT. Pancreas: Unremarkable. No pancreatic ductal dilatation or surrounding inflammatory changes. Spleen: Normal in size without focal abnormality. Adrenals/Urinary Tract: The adrenal glands unremarkable. There is no hydronephrosis on either side. There is symmetric enhancement and excretion of contrast by both kidneys. The visualized ureters and urinary bladder appear unremarkable. Stomach/Bowel: There is no bowel obstruction or active inflammation. The appendix is normal.  Vascular/Lymphatic: Mild aortoiliac atherosclerotic disease. The IVC is unremarkable. No portal venous gas. There is no adenopathy. Reproductive: The prostate is grossly unremarkable. Other: Mildly enlarged bilateral inguinal lymph nodes, likely reactive. Musculoskeletal: No acute osseous pathology.  Osteopenia. IMPRESSION: 1. No acute intrathoracic, abdominal, or pelvic pathology. 2. Fatty liver. 3. Cholelithiasis. 4. Aortic Atherosclerosis (ICD10-I70.0) and Emphysema (ICD10-J43.9). Electronically Signed   By: Vanetta Chou M.D.   On: 12/21/2023 19:15   CT FEMUR LEFT W CONTRAST Result Date: 12/21/2023 CLINICAL DATA:  Evaluate for infection EXAM: CT OF THE LOWER LEFT EXTREMITY WITH CONTRAST TECHNIQUE: Multidetector CT imaging of the lower left extremity was performed according to the standard protocol following intravenous contrast administration. RADIATION DOSE REDUCTION: This exam was performed according to the departmental dose-optimization program which includes automated exposure control, adjustment of the mA and/or kV according to patient size and/or use of iterative reconstruction technique. CONTRAST:  OMNIPAQUE  IOHEXOL  300 MG/ML  SOLN COMPARISON:  Left knee MRI 11/20/2020 FINDINGS: Bones/Joint/Cartilage Above knee amputation changes are again seen. The bones are diffusely osteopenic. There is increased cortical thickening and sclerosis of the femoral diaphysis without erosive change. Along the distal stump of the femur, there is some heterotopic ossification. There is no significant hip joint effusion. Ligaments Suboptimally assessed by CT. Muscles and Tendons No fluid collection or soft tissue gas. Deep fascial planes are preserved. Soft tissues There is mild subcutaneous edema of the distal femur. There are nonenlarged and mildly enlarged left inguinal lymph nodes measuring 11 mm. No soft tissue gas or fluid collection identified. IMPRESSION: 1. Mild subcutaneous edema of the distal femur. No  soft tissue gas or fluid collection identified. 2. Mild left inguinal lymphadenopathy. 3. Increased cortical thickening and sclerosis of the femoral diaphysis without erosive change. Findings may be related to chronic osteomyelitis. 4. Above knee amputation changes. Electronically Signed   By: Greig Pique M.D.   On: 12/21/2023 19:13   CT FEMUR RIGHT W CONTRAST Result Date: 12/21/2023 CLINICAL DATA:  Infection EXAM: CT OF THE LOWER RIGHT EXTREMITY WITH CONTRAST TECHNIQUE: Multidetector CT imaging of the lower right extremity was performed according to the standard protocol following intravenous contrast administration. RADIATION DOSE REDUCTION: This exam was performed according to the departmental dose-optimization program which includes automated exposure control, adjustment of the mA and/or kV according to patient size and/or use of iterative reconstruction technique. CONTRAST:  OMNIPAQUE  IOHEXOL  300 MG/ML  SOLN COMPARISON:  None Available. FINDINGS: Bones/Joint/Cartilage No acute bony abnormality. Specifically, no fracture, subluxation, or dislocation. No bone destruction to suggest osteomyelitis. Ligaments Suboptimally assessed by CT. Muscles and Tendons No acute findings Soft tissues Edema throughout the subcutaneous soft tissues diffusely. Focal cystic area along  the skin medially in the left knee region measures up to 2.4 cm, favor sebaceous cyst. IMPRESSION: No acute bony abnormality. Diffuse edema throughout the subcutaneous soft tissues. Electronically Signed   By: Franky Crease M.D.   On: 12/21/2023 19:11   DG Chest Port 1 View Result Date: 12/21/2023 EXAM: 1 VIEW XRAY OF THE CHEST 12/21/2023 03:42:56 PM COMPARISON: None available. CLINICAL HISTORY: Chest pain. FINDINGS: LUNGS AND PLEURA: No focal pulmonary opacity. No pulmonary edema. No pleural effusion. No pneumothorax. HEART AND MEDIASTINUM: No acute abnormality of the cardiac and mediastinal silhouettes. BONES AND SOFT TISSUES: No acute  osseous abnormality. IMPRESSION: 1. No acute process. Electronically signed by: Katheleen Faes MD 12/21/2023 03:45 PM EDT RP Workstation: [id number]   CT ABDOMEN PELVIS WO CONTRAST Result Date: 12/09/2023 CLINICAL DATA:  Right lower quadrant abdominal pain EXAM: CT ABDOMEN AND PELVIS WITHOUT CONTRAST TECHNIQUE: Multidetector CT imaging of the abdomen and pelvis was performed following the standard protocol without IV contrast. RADIATION DOSE REDUCTION: This exam was performed according to the departmental dose-optimization program which includes automated exposure control, adjustment of the mA and/or kV according to patient size and/or use of iterative reconstruction technique. COMPARISON:  12/03/2023 FINDINGS: Lower chest: No acute abnormality. Hepatobiliary: Cholelithiasis without superimposed pericholecystic inflammatory change. Liver unremarkable on this noncontrast examination. No intra or extrahepatic biliary ductal dilation. Pancreas: Unremarkable Spleen: Unremarkable Adrenals/Urinary Tract: Adrenal glands are unremarkable. Kidneys are normal, without renal calculi, focal lesion, or hydronephrosis. Bladder is unremarkable. Stomach/Bowel: Moderate colonic stool burden. Stomach, small bowel, and large bowel are otherwise unremarkable. No evidence of obstruction or focal inflammation. Appendix is normal. No free intraperitoneal gas or fluid. Vascular/Lymphatic: Aortic atherosclerosis. No enlarged abdominal or pelvic lymph nodes. Reproductive: Prostate is unremarkable. Other: No abdominal wall hernia or abnormality. No abdominopelvic ascites. Musculoskeletal: There is asymmetric infiltration within the soft tissues of the right flank, right lateral abdominal wall, and visualized right lower extremity particularly lateral to the greater trochanter which may reflect subcutaneous contusion in the setting of recent trauma. No loculated subcutaneous fluid collection identified. No acute bone abnormality. No lytic or  blastic bone lesion. IMPRESSION: 1. No acute intra-abdominal pathology identified. No definite radiographic explanation for the patient's reported symptoms. 2. Cholelithiasis. 3. Moderate colonic stool burden. 4. Asymmetric infiltration within the soft tissues of the right flank, right lateral abdominal wall, and visualized right lower extremity particularly lateral to the greater trochanter which may reflect subcutaneous contusion in the setting of recent trauma. No loculated subcutaneous fluid collection identified. 5. Aortic atherosclerosis. Aortic Atherosclerosis (ICD10-I70.0). Electronically Signed   By: Dorethia Molt M.D.   On: 12/09/2023 20:10   CT Head Wo Contrast Result Date: 12/03/2023 CLINICAL DATA:  Patient fell from wheelchair. Altered mental status and syncope. EXAM: CT HEAD WITHOUT CONTRAST CT CERVICAL SPINE WITHOUT CONTRAST TECHNIQUE: Multidetector CT imaging of the head and cervical spine was performed following the standard protocol without intravenous contrast. Multiplanar CT image reconstructions of the cervical spine were also generated. RADIATION DOSE REDUCTION: This exam was performed according to the departmental dose-optimization program which includes automated exposure control, adjustment of the mA and/or kV according to patient size and/or use of iterative reconstruction technique. COMPARISON:  Head CT 11/12/2023.  CT cervical spine 09/17/2023. FINDINGS: CT HEAD FINDINGS Brain: There is no evidence for acute hemorrhage, hydrocephalus, mass lesion, or abnormal extra-axial fluid collection. No definite CT evidence for acute infarction. Diffuse loss of parenchymal volume is consistent with atrophy. Vascular: No hyperdense vessel or unexpected calcification. Skull: No evidence  for fracture. No worrisome lytic or sclerotic lesion. Sinuses/Orbits: The visualized paranasal sinuses and mastoid air cells are clear. Visualized portions of the globes and intraorbital fat are unremarkable. Other:  None. CT CERVICAL SPINE FINDINGS Alignment: Reversal of normal cervical lordosis. No evidence for traumatic subluxation. Skull base and vertebrae: No acute fracture. No primary bone lesion or focal pathologic process. Soft tissues and spinal canal: No prevertebral fluid or swelling. No visible canal hematoma. Disc levels:  Preserved throughout. Upper chest: Paraseptal emphysema noted in the lung apices. Other: None. IMPRESSION: 1. No acute intracranial abnormality. 2. No evidence for cervical spine fracture or traumatic subluxation. 3. Reversal of normal cervical lordosis. This can be related to patient positioning, muscle spasm or soft tissue injury. 4.  Emphysema (ICD10-J43.9). Electronically Signed   By: Camellia Candle M.D.   On: 12/03/2023 13:24   CT Cervical Spine Wo Contrast Result Date: 12/03/2023 CLINICAL DATA:  Patient fell from wheelchair. Altered mental status and syncope. EXAM: CT HEAD WITHOUT CONTRAST CT CERVICAL SPINE WITHOUT CONTRAST TECHNIQUE: Multidetector CT imaging of the head and cervical spine was performed following the standard protocol without intravenous contrast. Multiplanar CT image reconstructions of the cervical spine were also generated. RADIATION DOSE REDUCTION: This exam was performed according to the departmental dose-optimization program which includes automated exposure control, adjustment of the mA and/or kV according to patient size and/or use of iterative reconstruction technique. COMPARISON:  Head CT 11/12/2023.  CT cervical spine 09/17/2023. FINDINGS: CT HEAD FINDINGS Brain: There is no evidence for acute hemorrhage, hydrocephalus, mass lesion, or abnormal extra-axial fluid collection. No definite CT evidence for acute infarction. Diffuse loss of parenchymal volume is consistent with atrophy. Vascular: No hyperdense vessel or unexpected calcification. Skull: No evidence for fracture. No worrisome lytic or sclerotic lesion. Sinuses/Orbits: The visualized paranasal sinuses and  mastoid air cells are clear. Visualized portions of the globes and intraorbital fat are unremarkable. Other: None. CT CERVICAL SPINE FINDINGS Alignment: Reversal of normal cervical lordosis. No evidence for traumatic subluxation. Skull base and vertebrae: No acute fracture. No primary bone lesion or focal pathologic process. Soft tissues and spinal canal: No prevertebral fluid or swelling. No visible canal hematoma. Disc levels:  Preserved throughout. Upper chest: Paraseptal emphysema noted in the lung apices. Other: None. IMPRESSION: 1. No acute intracranial abnormality. 2. No evidence for cervical spine fracture or traumatic subluxation. 3. Reversal of normal cervical lordosis. This can be related to patient positioning, muscle spasm or soft tissue injury. 4.  Emphysema (ICD10-J43.9). Electronically Signed   By: Camellia Candle M.D.   On: 12/03/2023 13:24   CT ABDOMEN PELVIS W CONTRAST Result Date: 12/03/2023 EXAM: CT ABDOMEN AND PELVIS WITH CONTRAST 12/03/2023 12:45:37 PM TECHNIQUE: CT of the abdomen and pelvis was performed with the administration of intravenous contrast (75mL iohexol  (OMNIPAQUE ) 350 MG/ML injection). Multiplanar reformatted images are provided for review. Automated exposure control, iterative reconstruction, and/or weight-based adjustment of the mA/kV was utilized to reduce the radiation dose to as low as reasonably achievable. COMPARISON: None available. CLINICAL HISTORY: Abdominal pain, acute, nonlocalized. Pt bib ems from bus stop c/o fall from wheelchair. PTAR alerted EMS for AMS and syncopal episode. FINDINGS: LOWER CHEST: Circumferential thickening of the visualized distal esophagus. Small hiatal hernia. LIVER: Normal size and contour. GALLBLADDER AND BILE DUCTS: Several small calcified gallstones layer at the dependent aspect of the nondilated gallbladder, without adjacent inflammatory change. SPLEEN: Normal size. No focal lesion. PANCREAS: No mass. No ductal dilatation. ADRENAL GLANDS:  Normal appearance. No mass. KIDNEYS,  URETERS AND BLADDER: No stones in the kidneys or ureters. No hydronephrosis. No perinephric or periureteral stranding. Urinary bladder distended. GI AND BOWEL: Stomach demonstrates no acute abnormality. There is no bowel obstruction. No bowel wall thickening. PERITONEUM AND RETROPERITONEUM: No ascites. No free air. VASCULATURE: Scattered calcified aortic plaque without aneurysm. LYMPH NODES: Mildly enlarged bilateral inguinal lymph nodes right greater than left, nonspecific. REPRODUCTIVE ORGANS: No significant abnormality. BONES AND SOFT TISSUES: Mild degenerative disc disease at L4-5. No acute osseous abnormality. No focal soft tissue abnormality. IMPRESSION: 1. No acute findings in the abdomen or pelvis. 2. Circumferential thickening of the visualized distal esophagus and small hiatal hernia. 3. Cholelithiasis Electronically signed by: Katheleen Faes MD 12/03/2023 01:07 PM EDT RP Workstation: [id number]   DG Chest Portable 1 View Result Date: 12/03/2023 EXAM: 1 VIEW XRAY OF THE CHEST 12/03/2023 10:05:00 AM COMPARISON: 11/12/2023 CLINICAL HISTORY: AMS. Ordered for AMS; Per triage notes: from bus stop c/o fall from wheelchair. PTAR alerted EMS for AMS and syncopal episode. Coffee ground emesis that started. Pt d/c 8/13 for skin infection. FINDINGS: LUNGS AND PLEURA: Improved aeration in the lung bases. No focal pulmonary opacity. No pulmonary edema. No pleural effusion. No pneumothorax. HEART AND MEDIASTINUM: No acute abnormality of the cardiac and mediastinal silhouettes. BONES AND SOFT TISSUES: No acute osseous abnormality. IMPRESSION: 1. Improved aeration in the lung bases. Electronically signed by: Katheleen Faes MD 12/03/2023 10:38 AM EDT RP Workstation: [id number]    Microbiology: Results for orders placed or performed during the hospital encounter of 12/21/23  Culture, blood (routine x 2)     Status: None   Collection Time: 12/21/23  4:10 PM   Specimen: BLOOD LEFT  FOREARM  Result Value Ref Range Status   Specimen Description   Final    BLOOD LEFT FOREARM Performed at [facility], [address], [place], [place] [address]    Special Requests   Final    BOTTLES DRAWN AEROBIC AND ANAEROBIC Blood Culture adequate volume Performed at [facility], [address], [place], [place] [address]    Culture   Final    NO GROWTH 5 DAYS Performed at [facility], [address], [place], [place] [address]    Report Status 12/26/2023 FINAL  Final  Culture, blood (routine x 2)     Status: None   Collection Time: 12/21/23  4:30 PM   Specimen: BLOOD RIGHT FOREARM  Result Value Ref Range Status   Specimen Description   Final    BLOOD RIGHT FOREARM Performed at [facility], [address] [place], [place] [address]    Special Requests   Final    BOTTLES DRAWN AEROBIC AND ANAEROBIC Blood Culture adequate volume Performed at [facility], [address] [place], [place] [address]    Culture   Final    NO GROWTH 5 DAYS Performed at [facility], [address] [place], [place] [address]    Report Status 12/26/2023 FINAL  Final  Resp panel by RT-PCR (RSV, Flu A&B, Covid) Anterior Nasal Swab     Status: None   Collection Time: 12/21/23  7:17 PM   Specimen: Anterior Nasal Swab  Result Value Ref Range Status   SARS Coronavirus 2 by RT PCR NEGATIVE NEGATIVE Final    Comment: (NOTE) SARS-CoV-2 target nucleic acids are NOT DETECTED.  The SARS-CoV-2 RNA is generally detectable in upper respiratory specimens during the acute phase of infection. The lowest concentration of SARS-CoV-2  viral copies this assay can detect is 138 copies/mL. A negative result does not preclude SARS-Cov-2 infection and should not be used as the sole basis for treatment or other patient management decisions. A negative result may occur with  improper specimen collection/handling, submission of specimen other than  nasopharyngeal swab, presence of viral mutation(s) within the areas targeted by this assay, and inadequate number of viral copies(<138 copies/mL). A negative result must be combined with clinical observations, patient history, and epidemiological information. The expected result is Negative.  Fact Sheet for Patients:  BloggerCourse.com  Fact Sheet for Healthcare Providers:  SeriousBroker.it  This test is no t yet approved or cleared by the [place]  FDA and  has been authorized for detection and/or diagnosis of SARS-CoV-2 by FDA under an Emergency Use Authorization (EUA). This EUA will remain  in effect (meaning this test can be used) for the duration of the COVID-19 declaration under Section 564(b)(1) of the Act, 21 U.S.C.section 360bbb-3(b)(1), unless the authorization is terminated  or revoked sooner.       Influenza A by PCR NEGATIVE NEGATIVE Final   Influenza B by PCR NEGATIVE NEGATIVE Final    Comment: (NOTE) The Xpert Xpress SARS-CoV-2/FLU/RSV plus assay is intended as an aid in the diagnosis of influenza from Nasopharyngeal swab specimens and should not be used as a sole basis for treatment. Nasal washings and aspirates are unacceptable for Xpert Xpress SARS-CoV-2/FLU/RSV testing.  Fact Sheet for Patients: BloggerCourse.com  Fact Sheet for Healthcare Providers: SeriousBroker.it  This test is not yet approved or cleared by the [place]  FDA and has been authorized for detection and/or diagnosis of SARS-CoV-2 by FDA under an Emergency Use Authorization (EUA). This EUA will remain in effect (meaning this test can be used) for the duration of the COVID-19 declaration under Section 564(b)(1) of the Act, 21 U.S.C. section 360bbb-3(b)(1), unless the authorization is terminated or revoked.     Resp Syncytial Virus by PCR NEGATIVE NEGATIVE Final    Comment:  (NOTE) Fact Sheet for Patients: BloggerCourse.com  Fact Sheet for Healthcare Providers: SeriousBroker.it  This test is not yet approved or cleared by the [place]  FDA and has been authorized for detection and/or diagnosis of SARS-CoV-2 by FDA under an Emergency Use Authorization (EUA). This EUA will remain in effect (meaning this test can be used) for the duration of the COVID-19 declaration under Section 564(b)(1) of the Act, 21 U.S.C. section 360bbb-3(b)(1), unless the authorization is terminated or revoked.  Performed at [facility], [address] [place], [place] [address]   MRSA Next Gen by PCR, Nasal     Status: None   Collection Time: 12/21/23  9:42 PM   Specimen: Nasal Mucosa; Nasal Swab  Result Value Ref Range Status   MRSA by PCR Next Gen NOT DETECTED NOT DETECTED Final    Comment: (NOTE) The GeneXpert MRSA Assay (FDA approved for NASAL specimens only), is one component of a comprehensive MRSA colonization surveillance program. It is not intended to diagnose MRSA infection nor to guide or monitor treatment for MRSA infections. Test performance is not FDA approved in patients less than 45 years old. Performed at [facility], [address] [place], [place] [address]     Labs: CBC: Recent Labs  Lab 12/23/23 0253 12/24/23 0244 12/25/23 0252 12/26/23 0436 12/28/23 0748  WBC 9.1 8.6 6.8 7.2 7.2  NEUTROABS 2.9 3.8 2.4 2.8  --   HGB 9.5* 8.4* 8.1* 9.2* 9.0*  HCT 30.4* 26.6* 26.2*  30.8* 28.7*  MCV 86.1 84.7 85.6 87.5 84.4  PLT 399 336 286 327 351   Basic Metabolic Panel: Recent Labs  Lab 12/23/23 0253 12/24/23 0244 12/25/23 0252 12/26/23 0436 12/28/23 0748  NA 134* 138 139 138 138  K 3.4* 2.8* 3.8 3.7 3.7  CL 101 102 106 103 102  CO2 21* 24 24 24 26   GLUCOSE 122* 138* 114* 108* 107*  BUN <5* <5* <5* <5* <5*  CREATININE 0.45* 0.38* 0.36* 0.36* 0.37*   CALCIUM  7.4* 7.6* 8.8* 9.0 8.8*  MG 1.9 1.8 1.9 1.7  --    Liver Function Tests: No results for input(s): AST, ALT, ALKPHOS, BILITOT, PROT, ALBUMIN  in the last 168 hours. CBG: No results for input(s): GLUCAP in the last 168 hours.  Discharge time spent: greater than 30 minutes.  Signed: Owen DELENA Lore, MD [facility] 12/29/2023

## 2023-12-29 NOTE — Progress Notes (Signed)
 Patient to be discharged to [facility] rehab today. Report called to this facility.

## 2023-12-29 NOTE — TOC Transition Note (Addendum)
 Transition of Care [facility]) - Discharge Note   Patient Details  Name: Jake Nguyen MRN: [id number] Date of Birth: February 17, 1966  Transition of [facility]) CM/SW Contact:  Bascom Service, RN Phone Number: 12/29/2023, 12:04 PM   Clinical Narrative: [facility] rep has bed available awaiting rm#, report tel# prior [facility].  -1:17p going to [facility] rm#[id number],report#[phone] PTAR called. No further CM needs. -2:44p patient has own w/c in rm & no one to p/u. Contacted [facility] rep Barn[place] will pick up today from 4th floor 4e @ [facility] after work & bring to facility on Monday. Patient can use [facility]'s w/c until his is brought to [facility]. -3:16p TC [facility] rep Allison-informed her that she does not have to come to hospital for w/c-its already picked up by [facility](thank you [facility] for the exception).    Final next level of care: Skilled Nursing Facility Barriers to Discharge: No Barriers Identified   Patient Goals and CMS Choice Patient states their goals for this hospitalization and ongoing recovery are:: [facility] SN[facility]. CMS Medicare.gov Compare Post Acute Care list provided to:: Patient Choice offered to / list presented to : Patient [place] ownership interest in [facility].provided to:: Patient    Discharge Placement         [facility] [address] [address]          Discharge Plan and Services Additional resources added to the After Visit Summary for   In-house Referral: Clinical Social Work Discharge Planning Services: CM Consult Post Acute Care Choice: Skilled Nursing Facility          DME Arranged: N/A DME Agency: NA       HH Arranged: NA HH Agency: NA        Social Drivers of Health (SDOH) Interventions SDOH Screenings   Food Insecurity: Patient Unable To Answer (12/21/2023)  Recent Concern: Food Insecurity - Food Insecurity Present (12/05/2023)  Housing: Unknown (12/21/2023)  Recent Concern: Housing -  High Risk (12/05/2023)  Transportation Needs: Patient Unable To Answer (12/21/2023)  Recent Concern: Transportation Needs - Unmet Transportation Needs (12/05/2023)  Utilities: Patient Unable To Answer (12/21/2023)  Alcohol  Screen: Medium Risk (11/25/2020)  Depression (PHQ2-9): Low Risk  (11/06/2019)  Social Connections: Socially Isolated (07/20/2023)  Tobacco Use: High Risk (12/21/2023)     Readmission Risk Interventions    11/15/2023   11:58 AM  Readmission Risk Prevention Plan  Transportation Screening Complete  Medication Review (RN Care Manager) Complete  PCP or Specialist appointment within 3-5 days of discharge Complete  HRI or Home Care Consult Complete  SW Recovery Care/Counseling Consult Complete  Palliative Care Screening Not Applicable  Skilled Nursing Facility Complete

## 2024-01-29 ENCOUNTER — Ambulatory Visit: Payer: Self-pay | Admitting: Nurse Practitioner

## 2024-03-12 ENCOUNTER — Ambulatory Visit: Payer: MEDICAID | Admitting: Gastroenterology

## 2024-03-12 ENCOUNTER — Encounter: Payer: Self-pay | Admitting: Gastroenterology

## 2024-03-12 ENCOUNTER — Telehealth: Payer: Self-pay | Admitting: *Deleted

## 2024-03-12 VITALS — BP 117/79 | HR 99 | Temp 97.9°F

## 2024-03-12 DIAGNOSIS — Z1211 Encounter for screening for malignant neoplasm of colon: Secondary | ICD-10-CM

## 2024-03-12 DIAGNOSIS — K921 Melena: Secondary | ICD-10-CM | POA: Diagnosis not present

## 2024-03-12 DIAGNOSIS — D649 Anemia, unspecified: Secondary | ICD-10-CM

## 2024-03-12 DIAGNOSIS — K5900 Constipation, unspecified: Secondary | ICD-10-CM

## 2024-03-12 DIAGNOSIS — F1011 Alcohol abuse, in remission: Secondary | ICD-10-CM

## 2024-03-12 NOTE — Telephone Encounter (Signed)
 Called [facility] and was placed on hold for several minutes, [facility] TCS/EGD, Dr. Shaaron, asa (ok rm 1), stop iron  7 days prior, miralax  daily 5 days prior to prep.

## 2024-03-12 NOTE — Patient Instructions (Addendum)
 We will get you scheduled for an upper endoscopy and colonoscopy in the near future to further evaluate your anemia (low hemoglobin levels) and the prior presence of blood in your stool.  You also have never had any form of colon cancer screening for which we will evaluate you for during the colonoscopy.  For your constipation given you are going 4-5 days without a bowel movement and you are not currently taking the MiraLAX  I want you to start taking it every other day 1 capful in 8 ounces of water to help have more regular bowel movements.  Given has been 3 months history of having the lab work, I want to recheck your hemoglobin levels as well as your iron  levels given you are taking daily supplements.  It was a pleasure to see you today. I want to create trusting relationships with patients. If you receive a survey regarding your visit,  I greatly appreciate you taking time to fill this out on paper or through your MyChart. I value your feedback.  Charmaine Melia, MSN, FNP-BC, AGACNP-BC [facility]

## 2024-03-12 NOTE — Progress Notes (Signed)
 GI Office Note    Referring Provider: No ref. provider found Primary Care Physician:  Pcp, No  Primary Gastroenterologist: Lamar HERO.Rourk, MD  Chief Complaint   Chief Complaint  Patient presents with   Blood In Stools    Pt has had blood in his stool a couple months ago. States he has not seen any blood in the last couple weeks.    History of Present Illness   Jake Nguyen is a 58 y.o. male presenting today at the request of No ref. provider found for unknown consultation.  Per patient's report he had blood in his stool 3 months ago.  Referred, will have appointment taking MiraLAX  daily and Colace twice daily as needed. Iron  tablet daily.   Per review of discharge summary from hospital in September 2025 he has a history of homelessness, alcohol  abuse, depression/anxiety, chronic osteomyelitis requiring left AKA in 2022 and right BKA in May 2025 as well as A-fib.  Patient had been picked up by EMS with paper scrubs on which she was discharged in over a week prior and he was soiled in urine and feces and was reporting episodes of sharp chest pain.  He had a CT of the left femur showing chronic osteomyelitis and mild subcutaneous edema of the distal femur.  Per review of his labs on day of discharge a hemoglobin of 9 and per review of his labs looks like his hemoglobin had been rising from 8-11.  CMP during this time With normal LFTs and low albumin  which was likely secondary to malnutrition.  He had an ethanol level that was significantly elevated at 151 on day of admission.  Echocardiogram in August 2025 with EF 65 to 70%, his mitral valve and aortic valve did not show any regurgitation.  No evidence of any pulmonic stenosis or aortic stenosis.  Today:  Discussed the use of AI scribe software for clinical note transcription with the patient, who gave verbal consent to proceed.  He had experienced blood in the stool since arriving at [facility] approximately three months ago.  The blood in the stool was initially noticed by others at the facility. He associates this with excessive alcohol  consumption prior to his admission to the rehab facility.  He has a significant history of alcohol  use, consuming about a case of beer and a fifth of liquor daily before entering rehab. He has been at the facility for almost three months and has not consumed alcohol  since his admission.  He denies any prior colonoscopy or upper endoscopy and has no known family history of colon cancer or colon polyps. He reports no symptoms of acid reflux or abdominal pain. He occasionally goes several days without a bowel movement, and does not take Miralax  despite it being prescribed, as he prefers to wait for natural bowel movements. He has colace ordered as needed as well but not taking.   He has experienced some weight loss, with his weight decreasing from approximately 147-148 pounds in June to 138-139 pounds in September. He attributes this to the limited food portions at the facility and prior hospitalization and notes that he has not been weighed recently but feels like he has gained some weight. Has a good appetite. No chest pain, shortness of breath.   He has been taking iron  supplements for anemia, which was noted during a hospital stay in September with hemoglobin levels ranging from 8 to 11, often around 9.       Wt Readings from Last 6  Encounters:  12/21/23 139 lb 1.8 oz (63.1 kg)  12/12/23 139 lb 1.8 oz (63.1 kg)  11/22/23 147 lb 3.2 oz (66.8 kg)  10/28/23 148 lb 5.9 oz (67.3 kg)  10/22/23 147 lb 0.8 oz (66.7 kg)  09/28/23 147 lb 0.8 oz (66.7 kg)    There is no height or weight on file to calculate BMI.  Current Outpatient Medications  Medication Sig Dispense Refill   aspirin  EC 81 MG tablet Take 81 mg by mouth daily. Swallow whole.     docusate sodium  (COLACE) 100 MG capsule Take 1 capsule (100 mg total) by mouth 2 (two) times daily as needed for mild constipation. 10 capsule 0    Ferrous Fumarate (HEMOCYTE - 106 MG FE) 324 (106 Fe) MG TABS tablet Take 1 tablet by mouth.     metoprolol  tartrate (LOPRESSOR ) 25 MG tablet Take 1 tablet (25 mg total) by mouth 2 (two) times daily. 30 tablet 0   mirtazapine  (REMERON ) 15 MG tablet Take 1 tablet (15 mg total) by mouth at bedtime. 30 tablet 0   Multiple Vitamin (MULTIVITAMIN WITH MINERALS) TABS tablet Take 1 tablet by mouth daily. 30 tablet 0   oxyCODONE  (OXY IR/ROXICODONE ) 5 MG immediate release tablet Take 1 tablet (5 mg total) by mouth every 4 (four) hours as needed for moderate pain (pain score 4-6). 30 tablet 0   polyethylene glycol powder (GLYCOLAX /MIRALAX ) 17 GM/SCOOP powder Dissolve 1 capful (17 g) into liquid as directed and take by mouth daily 238 g 0   thiamine  (VITAMIN B1) 100 MG tablet Take 100 mg by mouth daily.     traZODone  (DESYREL ) 50 MG tablet Take 1 tablet (50 mg total) by mouth at bedtime as needed for sleep (for difficulty initiating sleep in patients with depressive symptoms). (Patient not taking: Reported on 03/12/2024) 30 tablet 0   No current facility-administered medications for this visit.    Past Medical History:  Diagnosis Date   Acute metabolic encephalopathy 11/14/2020   Alcohol  abuse with alcohol -induced mood disorder (HCC) 11/10/2019   Alcohol  withdrawal syndrome, with delirium (HCC) 11/06/2019   Alcoholism (HCC)    Anxiety    Depression    Influenza A 05/20/2018   Sepsis (HCC) 11/14/2020   Septic shock (HCC) 11/24/2020    Past Surgical History:  Procedure Laterality Date   ABDOMINAL AORTOGRAM W/LOWER EXTREMITY N/A 08/04/2020   Procedure: ABDOMINAL AORTOGRAM W/LOWER EXTREMITY;  Surgeon: Serene Gaile ORN, MD;  Location: [facility] INVASIVE CV LAB;  Service: Cardiovascular;  Laterality: N/A;   AMPUTATION Left 08/05/2020   Procedure: AMPUTATION BELOW KNEE;  Surgeon: Serene Gaile ORN, MD;  Location[facility] OR;  Service: Vascular;  Laterality: Left;   AMPUTATION Left 11/21/2020   Procedure: REVISION  LEFT ABOVE KNEE AMPUTATION;  Surgeon: Serene Gaile ORN, MD;  Location: [facility] OR;  Service: Vascular;  Laterality: Left;   AMPUTATION Right 08/20/2023   Procedure: AMPUTATION BELOW KNEE;  Surgeon: Harden Jerona GAILS, MD;  Location: [facility] OR;  Service: Orthopedics;  Laterality: Right;   APPLICATION OF WOUND VAC Right 08/20/2023   Procedure: APPLICATION, WOUND VAC;  Surgeon: Harden Jerona GAILS, MD;  Location: [facility] OR;  Service: Orthopedics;  Laterality: Right;    Family History  Family history unknown: Yes    Allergies as of 03/12/2024 - Review Complete 03/12/2024  Allergen Reaction Noted   Bactoshield chg [chlorhexidine  gluconate] Itching 12/25/2023   Porcine (pork) protein-containing drug products Other (See Comments) 02/02/2016    Social History   Socioeconomic History   Marital  status: Single    Spouse name: Not on file   Number of children: Not on file   Years of education: Not on file   Highest education level: Not on file  Occupational History   Not on file  Tobacco Use   Smoking status: Every Day    Current packs/day: 0.50    Types: Cigarettes   Smokeless tobacco: Never  Substance and Sexual Activity   Alcohol  use: Yes    Comment: 6 pack perday   Drug use: Yes    Types: Marijuana   Sexual activity: Not Currently  Other Topics Concern   Not on file  Social History Narrative   Not on file   Social Drivers of Health   Financial Resource Strain: Not on file  Food Insecurity: Patient Unable To Answer (12/21/2023)   Hunger Vital Sign    Worried About Running Out of Food in the Last Year: Patient unable to answer    Ran Out of Food in the Last Year: Patient unable to answer  Recent Concern: Food Insecurity - Food Insecurity Present (12/05/2023)   Hunger Vital Sign    Worried About Running Out of Food in the Last Year: Sometimes true    Ran Out of Food in the Last Year: Sometimes true  Transportation Needs: Patient Unable To Answer (12/21/2023)   PRAPARE - Transportation    Lack of  Transportation (Medical): Patient unable to answer    Lack of Transportation (Non-Medical): Patient unable to answer  Recent Concern: Transportation Needs - Unmet Transportation Needs (12/05/2023)   PRAPARE - Administrator, Civil Service (Medical): Yes    Lack of Transportation (Non-Medical): Yes  Physical Activity: Not on file  Stress: Not on file  Social Connections: Socially Isolated (07/20/2023)   Social Connection and Isolation Panel    Frequency of Communication with Friends and Family: Never    Frequency of Social Gatherings with Friends and Family: Never    Attends Religious Services: Never    Database Administrator or Organizations: No    Attends Banker Meetings: Never    Marital Status: Never married  Intimate Partner Violence: Patient Unable To Answer (12/21/2023)   Humiliation, Afraid, Rape, and Kick questionnaire    Fear of Current or Ex-Partner: Patient unable to answer    Emotionally Abused: Patient unable to answer    Physically Abused: Patient unable to answer    Sexually Abused: Patient unable to answer    Review of Systems   Gen: Denies any fever, chills, fatigue, weight loss, lack of appetite.  CV: Denies chest pain, heart palpitations, peripheral edema, syncope.  Resp: Denies shortness of breath at rest or with exertion. Denies wheezing or cough.  GI: see HPI GU : Denies urinary burning, urinary frequency, urinary hesitancy MS: + amputation. Denies joint pain, muscle weakness, cramps  Derm: + dry skin. Denies rash, itching Psych: Denies depression, anxiety, memory loss, and confusion Heme: Denies bruising, bleeding, and enlarged lymph nodes.  Physical Exam   BP 117/79 (BP Location: Right Arm, Patient Position: Sitting, Cuff Size: Large)   Pulse 99   Temp 97.9 F (36.6 C) (Temporal)   General:   Alert and oriented. Pleasant and cooperative. Well-nourished and well-developed.  Head:  Normocephalic and atraumatic. Eyes:  Without  icterus, sclera clear and conjunctiva pink.  Ears:  Normal auditory acuity. Mouth:  No deformity or lesions, oral mucosa pink.  Poor dentition Lungs:  Clear to auscultation bilaterally. No wheezes, rales, or  rhonchi. No distress.  Heart:  S1, S2 present without murmurs appreciated.  No A-fib. Abdomen:  +BS, soft, non-tender and non-distended. No HSM noted. No guarding or rebound. No masses appreciated.  Exam limited given in wheelchair. Rectal:  deferred, in wheelchair - unable to get to exam table Msk:  Normal posture in wheelchair.  Unable to assess gait. Extremities: Right BKA, left AKA Neurologic:  Alert and  oriented x4;  grossly normal neurologically. Skin:  Intact without significant lesions or rashes. Psych:  Alert and cooperative. Normal mood and affect.  Assessment & Plan   Jake Nguyen is a 58 y.o. male with a history of homelessness, alcohol  abuse, depression/anxiety, chronic osteomyelitis requiring left AKA in 2022 and right BKA in May 2025 as well as A-fib presenting today to discuss prior blood in his stool.    Evaluation of prior rectal bleeding and iron  deficiency anemia Rectal bleeding likely related to alcohol  use, with anemia possibly due to alcohol -induced gastric inflammation and vascular abnormalities. Differential includes polyps, malignancy, angiectasias, or hemorrhoids, gastritis, duodenitis, PUD, esophagitis. Anemia with hemoglobin levels between 8 to 11, more in the 9 range, noted in September. Alcohol  use may have contributed to anemia and bleeding. - Ordered colonoscopy and upper endoscopy to evaluate for cause of anemia.  - Continue iron  supplementation, will hold for 1 week prior as below.  - Recheck labs including CBC and iron  panel.   Proceed with upper endoscopy and colonoscopy with propofol  by Dr. Shaaron in near future: the risks, benefits, and alternatives have been discussed with the patient in detail. The patient states understanding and desires to  proceed. ASA 3 (okay room 1/2)  Hold iron  for 1 week prior  MiraLAX  daily for 5 days prior  Constipation Intermittent constipation with bowel movements every other day, sometimes 4-5 days without BM. Iron  supplementation may contribute to constipation. He does not regularly use Miralax  despite constipation given his preference. Colace on MAR but not taking. Encouraged to take more frequently for minimal goal of CBM every other day. Not having abdominal pain.  - Encouraged use of Miralax  if no bowel movement for more than two days. - Advised taking Miralax  at least every other day to achieve more regular bowel movements.  Colorectal cancer screening No prior colonoscopy. Denies family history.. Screening is necessary due to rectal bleeding and lack of prior screening. - Will schedule colonoscopy for colorectal cancer screening.      Given history of alcohol  abuse, will also check INR to calculate fib 4 score and monitor for fatty liver and chronic liver disease.  Follow up   Follow up 4 months.   Charmaine Melia, MSN, FNP-BC, AGACNP-BC [facility]

## 2024-03-20 NOTE — Telephone Encounter (Signed)
 Will call once we get January schedule

## 2024-03-25 MED ORDER — PEG 3350-KCL-NA BICARB-NACL 420 G PO SOLR: 4000.0000 mL | ORAL | 0 refills | Status: AC

## 2024-03-25 MED ORDER — GAS-X INFANT DROPS 20 MG/0.3ML PO LIQD: 30.0000 mL | ORAL | 0 refills | Status: AC

## 2024-03-25 MED ORDER — FLEET ENEMA RE ENEM: 1.0000 | ENEMA | RECTAL | 0 refills | Status: AC

## 2024-03-25 MED ORDER — BISACODYL EC 5 MG PO TBEC: 10.0000 mg | DELAYED_RELEASE_TABLET | Freq: Once | ORAL | 0 refills | Status: AC

## 2024-03-25 NOTE — Telephone Encounter (Signed)
 Spoke with [facility] and patient has been scheduled for 1/15. Aware will fax instructions over to them at [phone].

## 2024-03-25 NOTE — Addendum Note (Signed)
 Addended by: JEANELL GRAEME RAMAN on: 03/25/2024 09:32 AM   Modules accepted: Orders

## 2024-04-22 ENCOUNTER — Encounter (HOSPITAL_COMMUNITY): Payer: Self-pay

## 2024-04-22 ENCOUNTER — Encounter (HOSPITAL_COMMUNITY)
Admission: RE | Admit: 2024-04-22 | Discharge: 2024-04-22 | Disposition: A | Discharge status: Home / Self Care | Payer: MEDICAID | Source: Ambulatory Visit | Attending: Internal Medicine | Admitting: Internal Medicine

## 2024-04-22 NOTE — Pre-Procedure Instructions (Signed)
 Did pre-op phone call with Gillie, coordinator of [facility]. She states pt is wheel chair bound but does not need a slide, he transfers well on his own. She states he also signs for himself and does not need approval or signature from anyone else.

## 2024-04-25 ENCOUNTER — Ambulatory Visit (HOSPITAL_COMMUNITY): Payer: Self-pay | Admitting: Anesthesiology

## 2024-04-25 ENCOUNTER — Other Ambulatory Visit: Payer: Self-pay

## 2024-04-25 ENCOUNTER — Encounter (HOSPITAL_COMMUNITY): Payer: Self-pay | Admitting: Internal Medicine

## 2024-04-25 ENCOUNTER — Encounter (HOSPITAL_COMMUNITY): Admission: RE | Disposition: A | Payer: Self-pay | Source: Home / Self Care | Attending: Internal Medicine

## 2024-04-25 ENCOUNTER — Encounter (HOSPITAL_COMMUNITY): Payer: Self-pay | Admitting: Anesthesiology

## 2024-04-25 ENCOUNTER — Ambulatory Visit (HOSPITAL_COMMUNITY)
Admission: RE | Admit: 2024-04-25 | Discharge: 2024-04-25 | Disposition: A | Discharge status: Home / Self Care | Payer: MEDICAID | Attending: Internal Medicine | Admitting: Internal Medicine

## 2024-04-25 DIAGNOSIS — Z5982 Transportation insecurity: Secondary | ICD-10-CM | POA: Insufficient documentation

## 2024-04-25 DIAGNOSIS — F17210 Nicotine dependence, cigarettes, uncomplicated: Secondary | ICD-10-CM | POA: Diagnosis not present

## 2024-04-25 DIAGNOSIS — K449 Diaphragmatic hernia without obstruction or gangrene: Secondary | ICD-10-CM | POA: Diagnosis not present

## 2024-04-25 DIAGNOSIS — D509 Iron deficiency anemia, unspecified: Secondary | ICD-10-CM | POA: Diagnosis present

## 2024-04-25 DIAGNOSIS — F32A Depression, unspecified: Secondary | ICD-10-CM | POA: Insufficient documentation

## 2024-04-25 DIAGNOSIS — F419 Anxiety disorder, unspecified: Secondary | ICD-10-CM | POA: Insufficient documentation

## 2024-04-25 DIAGNOSIS — K264 Chronic or unspecified duodenal ulcer with hemorrhage: Secondary | ICD-10-CM | POA: Diagnosis not present

## 2024-04-25 DIAGNOSIS — K3189 Other diseases of stomach and duodenum: Secondary | ICD-10-CM | POA: Diagnosis not present

## 2024-04-25 DIAGNOSIS — K2980 Duodenitis without bleeding: Secondary | ICD-10-CM | POA: Diagnosis not present

## 2024-04-25 DIAGNOSIS — Z539 Procedure and treatment not carried out, unspecified reason: Secondary | ICD-10-CM | POA: Insufficient documentation

## 2024-04-25 DIAGNOSIS — K625 Hemorrhage of anus and rectum: Secondary | ICD-10-CM

## 2024-04-25 HISTORY — PX: FLEXIBLE SIGMOIDOSCOPY: SHX5431

## 2024-04-25 HISTORY — PX: ESOPHAGOGASTRODUODENOSCOPY: SHX5428

## 2024-04-25 MED ORDER — PROPOFOL 500 MG/50ML IV EMUL
INTRAVENOUS | Status: DC | PRN
  Administered 2024-04-25: 200 ug/kg/min via INTRAVENOUS

## 2024-04-25 MED ORDER — PROPOFOL 10 MG/ML IV BOLUS
INTRAVENOUS | Status: DC | PRN
  Administered 2024-04-25: 100 mg via INTRAVENOUS

## 2024-04-25 MED ORDER — LACTATED RINGERS IV SOLN: INTRAVENOUS | Status: DC

## 2024-04-25 MED ORDER — PHENYLEPHRINE 80 MCG/ML (10ML) SYRINGE FOR IV PUSH (FOR BLOOD PRESSURE SUPPORT)
PREFILLED_SYRINGE | INTRAVENOUS | Status: DC | PRN
  Administered 2024-04-25: 160 ug via INTRAVENOUS

## 2024-04-25 MED ORDER — LIDOCAINE 2% (20 MG/ML) 5 ML SYRINGE
INTRAMUSCULAR | Status: DC | PRN
  Administered 2024-04-25: 80 mg via INTRAVENOUS

## 2024-04-25 MED ORDER — EPHEDRINE SULFATE (PRESSORS) 25 MG/5ML IV SOSY
PREFILLED_SYRINGE | INTRAVENOUS | Status: DC | PRN
  Administered 2024-04-25: 5 mg via INTRAVENOUS

## 2024-04-25 NOTE — H&P (Signed)
 @LOGO @   Gastroenterology Progress Note    Primary Care Physician:  Pcp, No Primary Gastroenterologist:  Dr. Shaaron  Pre-Procedure History & Physical: HPI:  Jake Nguyen is a 59 y.o. male here for further evaluation of iron  deficiency anemia self-limiting rectal bleeding no prior EGD or colonoscopy.  He is here for both an EGD and a colonoscopy.  Past Medical History:  Diagnosis Date   Acute metabolic encephalopathy 11/14/2020   Alcohol  abuse with alcohol -induced mood disorder (HCC) 11/10/2019   Alcohol  withdrawal syndrome, with delirium (HCC) 11/06/2019   Alcoholism (HCC)    Anxiety    Depression    Influenza A 05/20/2018   Sepsis (HCC) 11/14/2020   Septic shock (HCC) 11/24/2020    Past Surgical History:  Procedure Laterality Date   ABDOMINAL AORTOGRAM W/LOWER EXTREMITY N/A 08/04/2020   Procedure: ABDOMINAL AORTOGRAM W/LOWER EXTREMITY;  Surgeon: Serene Gaile ORN, MD;  Location: [facility];  Service: Cardiovascular;  Laterality: N/A;   AMPUTATION Left 08/05/2020   Procedure: AMPUTATION BELOW KNEE;  Surgeon: Serene Gaile ORN, MD;  Location: [facility] OR;  Service: Vascular;  Laterality: Left;   AMPUTATION Left 11/21/2020   Procedure: REVISION LEFT ABOVE KNEE AMPUTATION;  Surgeon: Serene Gaile ORN, MD;  Location: [facility] OR;  Service: Vascular;  Laterality: Left;   AMPUTATION Right 08/20/2023   Procedure: AMPUTATION BELOW KNEE;  Surgeon: Harden Jerona GAILS, MD;  Location: [facility] OR;  Service: Orthopedics;  Laterality: Right;   APPLICATION OF WOUND VAC Right 08/20/2023   Procedure: APPLICATION, WOUND VAC;  Surgeon: Harden Jerona GAILS, MD;  Location: [facility] OR;  Service: Orthopedics;  Laterality: Right;    Prior to Admission medications  Medication Sig Start Date End Date Taking? Authorizing Provider  aspirin  EC 81 MG tablet Take 81 mg by mouth daily. Swallow whole.   Yes [provider]  polyethylene glycol powder (GLYCOLAX /MIRALAX ) 17 GM/SCOOP powder Dissolve 1 capful (17 g) into  liquid as directed and take by mouth daily 12/13/23  Yes Leotis Bogus, MD  polyethylene glycol-electrolytes (NULYTELY ) 420 g solution Take 4,000 mLs by mouth as directed. 03/25/24  Yes Ethan Kasperski, Lamar HERO, MD  sodium phosphate  (FLEET) ENEM Place 133 mLs (1 enema total) rectally as directed. 03/25/24  Yes Gearald Stonebraker, Lamar HERO, MD  docusate sodium  (COLACE) 100 MG capsule Take 1 capsule (100 mg total) by mouth 2 (two) times daily as needed for mild constipation. 12/12/23   Leotis Bogus, MD  Emollient (CERAVE MOISTURIZING) CREA Apply topically.    [provider]  Ferrous Fumarate (HEMOCYTE - 106 MG FE) 324 (106 Fe) MG TABS tablet Take 1 tablet by mouth.    [provider]  metoprolol  tartrate (LOPRESSOR ) 25 MG tablet Take 1 tablet (25 mg total) by mouth 2 (two) times daily. 12/29/23 03/12/24  Regalado, Owen A, MD  mirtazapine  (REMERON ) 15 MG tablet Take 1 tablet (15 mg total) by mouth at bedtime. 12/12/23 03/12/24  Leotis Bogus, MD  Multiple Vitamin (MULTIVITAMIN WITH MINERALS) TABS tablet Take 1 tablet by mouth daily. 12/30/23   Regalado, Belkys A, MD  oxyCODONE  (OXY IR/ROXICODONE ) 5 MG immediate release tablet Take 1 tablet (5 mg total) by mouth every 4 (four) hours as needed for moderate pain (pain score 4-6). 12/29/23   Regalado, Belkys A, MD  Simethicone (GAS-X INFANT DROPS) 40 MG/0.6ML LIQD Take 30 mLs (2,000 mg total) by mouth as directed. 03/25/24   Jenniferlynn Saad, Lamar HERO, MD  thiamine  (VITAMIN B1) 100 MG tablet Take 100 mg by mouth daily.  [provider]  traZODone  (DESYREL ) 50 MG tablet Take 1 tablet (50 mg total) by mouth at bedtime as needed for sleep (for difficulty initiating sleep in patients with depressive symptoms). Patient not taking: Reported on 03/12/2024 12/29/23 01/28/24  Madelyne Serum A, MD    Allergies as of 03/25/2024 - Review Complete 03/12/2024  Allergen Reaction Noted   Bactoshield chg [chlorhexidine  gluconate] Itching 12/25/2023   Porcine (pork)  protein-containing drug products Other (See Comments) 02/02/2016    Family History  Family history unknown: Yes    Social History   Socioeconomic History   Marital status: Single    Spouse name: Not on file   Number of children: Not on file   Years of education: Not on file   Highest education level: Not on file  Occupational History   Not on file  Tobacco Use   Smoking status: Every Day    Current packs/day: 0.50    Types: Cigarettes   Smokeless tobacco: Never  Substance and Sexual Activity   Alcohol  use: Yes    Comment: 6 pack perday   Drug use: Yes    Types: Marijuana   Sexual activity: Not Currently  Other Topics Concern   Not on file  Social History Narrative   Not on file   Social Drivers of Health   Tobacco Use: High Risk (04/25/2024)   Patient History    Smoking Tobacco Use: Every Day    Smokeless Tobacco Use: Never    Passive Exposure: Not on file  Financial Resource Strain: Not on file  Food Insecurity: Patient Unable To Answer (12/21/2023)   Epic    Worried About Programme Researcher, Broadcasting/film/video in the Last Year: Patient unable to answer    Ran Out of Food in the Last Year: Patient unable to answer  Recent Concern: Food Insecurity - Food Insecurity Present (12/05/2023)   Epic    Worried About Programme Researcher, Broadcasting/film/video in the Last Year: Sometimes true    Ran Out of Food in the Last Year: Sometimes true  Transportation Needs: Patient Unable To Answer (12/21/2023)   Epic    Lack of Transportation (Medical): Patient unable to answer    Lack of Transportation (Non-Medical): Patient unable to answer  Recent Concern: Transportation Needs - Unmet Transportation Needs (12/05/2023)   Epic    Lack of Transportation (Medical): Yes    Lack of Transportation (Non-Medical): Yes  Physical Activity: Not on file  Stress: Not on file  Social Connections: Socially Isolated (07/20/2023)   Social Connection and Isolation Panel    Frequency of Communication with Friends and Family: Never     Frequency of Social Gatherings with Friends and Family: Never    Attends Religious Services: Never    Database Administrator or Organizations: No    Attends Banker Meetings: Never    Marital Status: Never married  Intimate Partner Violence: Patient Unable To Answer (12/21/2023)   Epic    Fear of Current or Ex-Partner: Patient unable to answer    Emotionally Abused: Patient unable to answer    Physically Abused: Patient unable to answer    Sexually Abused: Patient unable to answer  Depression (EYV7-0): Not on file  Alcohol  Screen: Not on file  Housing: Unknown (12/21/2023)   Epic    Unable to Pay for Housing in the Last Year: Patient unable to answer    Number of Times Moved in the Last Year: Not on file    Homeless in the  Last Year: Not on file  Recent Concern: Housing - High Risk (12/05/2023)   Epic    Unable to Pay for Housing in the Last Year: Yes    Number of Times Moved in the Last Year: 0    Homeless in the Last Year: Yes  Utilities: Patient Unable To Answer (12/21/2023)   Epic    Threatened with loss of utilities: Patient unable to answer  Health Literacy: Not on file    Review of Systems   See HPI, otherwise negative ROS  Physical Exam: BP 103/72   Pulse 70   Temp 97.9 F (36.6 C) (Oral)   Resp 18   Ht 5' 10 (1.778 m)   Wt 63.1 kg   SpO2 97%   BMI 19.96 kg/m  General:   Alert,  Well-developed, well-nourished, pleasant and cooperative in NAD Skin:  Intact without significant lesions or rashes. Eyes:  Sclera clear, no icterus.   Conjunctiva pink. Ears:  Normal auditory acuity. Nose:  No deformity, discharge,  or lesions. Mouth:  No deformity or lesions. Neck:  Supple; no masses or thyromegaly. No significant cervical adenopathy. Lungs:  Clear throughout to auscultation.   No wheezes, crackles, or rhonchi. No acute distress. Heart:  Regular rate and rhythm; no murmurs, clicks, rubs,  or gallops. Abdomen: Non-distended, normal bowel sounds.  Soft  and nontender without appreciable mass or hepatosplenomegaly.   Impression/Plan:   59 year old gentleman with iron  deficiency anemia here for both an EGD and a colonoscopy.  The risks, benefits, limitations, imponderables and alternatives regarding both EGD and colonoscopy have been reviewed with the patient. Questions have been answered. All parties agreeable.      Notice: This dictation was prepared with Dragon dictation along with smaller phrase technology. Any transcriptional errors that result from this process are unintentional and may not be corrected upon review.

## 2024-04-25 NOTE — Op Note (Signed)
 [facility] Patient Name: Jake Nguyen Procedure Date: 04/25/2024 6:54 AM MRN: [id number] Date of Birth: Feb 27, 1966 Attending MD: Lamar Ozell Hollingshead , MD, [id number] CSN: [id number] Age: 59 Admit Type: Outpatient Procedure:                Upper GI endoscopy Indications:              Iron  deficiency anemia Providers:                Lamar Ozell Hollingshead, MD, Madelin Hunter, RN, Dorcas Lenis, Technician Referring MD:              Medicines:                Propofol  per Anesthesia Complications:            No immediate complications. Estimated Blood Loss:     Estimated blood loss was minimal. Procedure:                Pre-Anesthesia Assessment:                           - Prior to the procedure, a History and Physical                            was performed, and patient medications and                            allergies were reviewed. The patient's tolerance of                            previous anesthesia was also reviewed. The risks                            and benefits of the procedure and the sedation                            options and risks were discussed with the patient.                            All questions were answered, and informed consent                            was obtained. Prior Anticoagulants: The patient has                            taken no anticoagulant or antiplatelet agents. ASA                            Grade Assessment: III - A patient with severe                            systemic disease. After reviewing the risks and  benefits, the patient was deemed in satisfactory                            condition to undergo the procedure.                           After obtaining informed consent, the endoscope was                            passed under direct vision. Throughout the                            procedure, the patient's blood pressure, pulse, and                            oxygen  saturations were monitored continuously. The                            [id number] ([id number]) Upper was introduced through                            the mouth, and advanced to the second part of                            duodenum. The upper GI endoscopy was accomplished                            without difficulty. The patient tolerated the                            procedure well. Scope In: 7:42:19 AM Scope Out: 7:48:38 AM Total Procedure Duration: 0 hours 6 minutes 19 seconds  Findings:      The examined esophagus was normal. Patchy erythema of the gastric       mucosa. Moderate size hiatal hernia. No ulcer or infiltrating process.       Pylorus patent.      Examination the bulb second portion reveals scattered erosions. Duodenal       biopsies taken. Gastric biopsies taken. Impression:               - Normal esophagus. Gastric erythema. Duodenal                            erosionsstatus post gastric and duodenal biopsy Moderate Sedation:      Moderate (conscious) sedation was personally administered by an       anesthesia professional. The following parameters were monitored: oxygen       saturation, heart rate, blood pressure, respiratory rate, EKG, adequacy       of pulmonary ventilation, and response to care. Recommendation:           - Patient has a contact number available for                            emergencies. The signs and symptoms of potential  delayed complications were discussed with the                            patient. Return to normal activities tomorrow.                            Written discharge instructions were provided to the                            patient.                           - Advance diet as tolerated. Follow-up on                            pathology. Continue present medications. See                            colonoscopy report. Procedure Code(s):        --- Professional ---                           [phone],  Esophagogastroduodenoscopy, flexible,                            transoral; diagnostic, including collection of                            specimen(s) by brushing or washing, when performed                            (separate procedure) Diagnosis Code(s):        --- Professional ---                           D50.9, Iron  deficiency anemia, unspecified CPT copyright 2022 American Medical Association. All rights reserved. The codes documented in this report are preliminary and upon coder review may  be revised to meet current compliance requirements. Lamar HERO. Tashawnda Bleiler, MD Lamar Ozell Hollingshead, MD 04/25/2024 8:07:12 AM This report has been signed electronically. Number of Addenda: 0

## 2024-04-25 NOTE — Discharge Instructions (Signed)
 EGD Discharge instructions Please read the instructions outlined below and refer to this sheet in the next few weeks. These discharge instructions provide you with general information on caring for yourself after you leave the hospital. Your doctor may also give you specific instructions. While your treatment has been planned according to the most current medical practices available, unavoidable complications occasionally occur. If you have any problems or questions after discharge, please call your doctor. ACTIVITY You may resume your regular activity but move at a slower pace for the next 24 hours.  Take frequent rest periods for the next 24 hours.  Walking will help expel (get rid of) the air and reduce the bloated feeling in your abdomen.  No driving for 24 hours (because of the anesthesia (medicine) used during the test).  You may shower.  Do not sign any important legal documents or operate any machinery for 24 hours (because of the anesthesia used during the test).  NUTRITION Drink plenty of fluids.  You may resume your normal diet.  Begin with a light meal and progress to your normal diet.  Avoid alcoholic beverages for 24 hours or as instructed by your caregiver.  MEDICATIONS You may resume your normal medications unless your caregiver tells you otherwise.  WHAT YOU CAN EXPECT TODAY You may experience abdominal discomfort such as a feeling of fullness or gas pains.  FOLLOW-UP Your doctor will discuss the results of your test with you.  SEEK IMMEDIATE MEDICAL ATTENTION IF ANY OF THE FOLLOWING OCCUR: Excessive nausea (feeling sick to your stomach) and/or vomiting.  Severe abdominal pain and distention (swelling).  Trouble swallowing.  Temperature over 101 F (37.8 C).  Rectal bleeding or vomiting of blood.     Stomach and small intestine slightly inflamed.  Biopsies taken  Unable to do your colonoscopy due to a poor prep.  My office will be in touch about getting this  rescheduled  At patient request, I spoke to [place] at [phone] reviewed findings

## 2024-04-25 NOTE — Anesthesia Preprocedure Evaluation (Signed)
"                                    Anesthesia Evaluation  Patient identified by MRN, date of birth, ID band Patient awake    Reviewed: Allergy & Precautions, H&P , NPO status , Patient's Chart, lab work & pertinent test results, reviewed documented beta blocker date and time   Airway Mallampati: II  TM Distance: >3 FB Neck ROM: full    Dental no notable dental hx.    Pulmonary neg pulmonary ROS, Current Smoker   Pulmonary exam normal breath sounds clear to auscultation       Cardiovascular Exercise Tolerance: Good hypertension, + Peripheral Vascular Disease   Rhythm:regular Rate:Normal     Neuro/Psych  PSYCHIATRIC DISORDERS Anxiety Depression     Neuromuscular disease    GI/Hepatic negative GI ROS, Neg liver ROS,,,  Endo/Other  negative endocrine ROS    Renal/GU negative Renal ROS  negative genitourinary   Musculoskeletal   Abdominal   Peds  Hematology negative hematology ROS (+)   Anesthesia Other Findings   Reproductive/Obstetrics negative OB ROS                              Anesthesia Physical Anesthesia Plan  ASA: 2  Anesthesia Plan: MAC   Post-op Pain Management:    Induction:   PONV Risk Score and Plan: Propofol  infusion  Airway Management Planned:   Additional Equipment:   Intra-op Plan:   Post-operative Plan:   Informed Consent: I have reviewed the patients History and Physical, chart, labs and discussed the procedure including the risks, benefits and alternatives for the proposed anesthesia with the patient or authorized representative who has indicated his/her understanding and acceptance.     Dental Advisory Given  Plan Discussed with: CRNA  Anesthesia Plan Comments:         Anesthesia Quick Evaluation  "

## 2024-04-25 NOTE — Transfer of Care (Signed)
 Immediate Anesthesia Transfer of Care Note  Patient: Jake Nguyen  Procedure(s) Performed: EGD (ESOPHAGOGASTRODUODENOSCOPY) SIGMOIDOSCOPY, FLEXIBLE  Patient Location: Short Stay  Anesthesia Type:General  Level of Consciousness: awake, alert , oriented, and patient cooperative  Airway & Oxygen Therapy: Patient Spontanous Breathing  Post-op Assessment: Report given to RN, Post -op Vital signs reviewed and stable, and Patient moving all extremities X 4  Post vital signs: Reviewed and stable  Last Vitals:  Vitals Value Taken Time  BP 120/67 04/25/24 08:06  Temp 36.4 C 04/25/24 08:06  Pulse 58 04/25/24 08:06  Resp 21 04/25/24 08:06  SpO2 93 % 04/25/24 08:10    Last Pain:  Vitals:   04/25/24 0806  TempSrc: Oral  PainSc: 0-No pain         Complications: No notable events documented.

## 2024-04-25 NOTE — Op Note (Signed)
 [facility] Patient Name: Jake Nguyen Procedure Date: 04/25/2024 6:53 AM MRN: [id number] Date of Birth: 1965/07/05 Attending MD: Lamar Ozell Hollingshead , MD, [id number] CSN: [id number] Age: 59 Admit Type: Outpatient Procedure:                Colonoscopy -attempted. Diagnostic flexible                            sigmoidoscopy. Indications:              Iron  deficiency anemia Providers:                Lamar Ozell Hollingshead, MD, Madelin Hunter, RN, Dorcas Lenis, Technician Referring MD:              Medicines:                Propofol  per Anesthesia Complications:            No immediate complications. Estimated Blood Loss:     Estimated blood loss: none. Procedure:                Pre-Anesthesia Assessment:                           - Prior to the procedure, a History and Physical                            was performed, and patient medications and                            allergies were reviewed. The patient's tolerance of                            previous anesthesia was also reviewed. The risks                            and benefits of the procedure and the sedation                            options and risks were discussed with the patient.                            All questions were answered, and informed consent                            was obtained. Prior Anticoagulants: The patient has                            taken no anticoagulant or antiplatelet agents. ASA                            Grade Assessment: III - A patient with severe                            systemic  disease. After reviewing the risks and                            benefits, the patient was deemed in satisfactory                            condition to undergo the procedure.                           After obtaining informed consent, the colonoscope                            was passed under direct vision. Throughout the                            procedure, the  patient's blood pressure, pulse, and                            oxygen saturations were monitored continuously. The                            [id number] ([id number]) Colon was introduced through                            the anus with the intention of advancing to the                            cecum. The scope was advanced to the sigmoid colon                            before the procedure was aborted. Medications were                            given. The colonoscopy was performed without                            difficulty. The patient tolerated the procedure                            well. The quality of the bowel preparation was                            inadequate. The rectum was photographed. The                            colonoscopy was aborted. Scope In: 7:56:43 AM Scope Out: 7:59:50 AM Total Procedure Duration: 0 hours 3 minutes 7 seconds  Findings:      Poor prep formed and semiformed stool in the rectum drawing up in the       sigmoid. Cannot see anything well. Procedure aborted. Impression:               - Preparation of the colon was inadequate.                           -  The procedure was aborted.                           - No specimens collected. Moderate Sedation:      Moderate (conscious) sedation was personally administered by an       anesthesia professional. The following parameters were monitored: oxygen       saturation, heart rate, blood pressure, respiratory rate, EKG, adequacy       of pulmonary ventilation, and response to care. Recommendation:           - Patient has a contact number available for                            emergencies. The signs and symptoms of potential                            delayed complications were discussed with the                            patient. Return to normal activities tomorrow.                            Written discharge instructions were provided to the                            patient.                            - Advance diet as tolerated. See EGD report. Will                            have the patient return to the office to regroup. Procedure Code(s):        --- Professional ---                           [phone], 53, Colonoscopy, flexible; diagnostic,                            including collection of specimen(s) by brushing or                            washing, when performed (separate procedure) Diagnosis Code(s):        --- Professional ---                           D50.9, Iron  deficiency anemia, unspecified CPT copyright 2022 American Medical Association. All rights reserved. The codes documented in this report are preliminary and upon coder review may  be revised to meet current compliance requirements. Lamar HERO. Kirsi Hugh, MD Lamar Ozell Hollingshead, MD 04/25/2024 8:19:44 AM This report has been signed electronically. Number of Addenda: 0

## 2024-04-26 ENCOUNTER — Encounter (HOSPITAL_COMMUNITY): Payer: Self-pay | Admitting: Internal Medicine

## 2024-04-26 LAB — SURGICAL PATHOLOGY

## 2024-04-26 NOTE — Anesthesia Postprocedure Evaluation (Signed)
"   Anesthesia Post Note  Patient: Jake Nguyen  Procedure(s) Performed: EGD (ESOPHAGOGASTRODUODENOSCOPY) SIGMOIDOSCOPY, FLEXIBLE  Patient location during evaluation: Phase II Anesthesia Type: MAC Level of consciousness: awake Pain management: pain level controlled Vital Signs Assessment: post-procedure vital signs reviewed and stable Respiratory status: spontaneous breathing and respiratory function stable Cardiovascular status: blood pressure returned to baseline and stable Postop Assessment: no headache and no apparent nausea or vomiting Anesthetic complications: no Comments: Late entry   No notable events documented.   Last Vitals:  Vitals:   04/25/24 0806 04/25/24 0810  BP: 120/67   Pulse: (!) 58   Resp: (!) 21   Temp: 36.4 C   SpO2: 92% 93%    Last Pain:  Vitals:   04/25/24 0806  TempSrc: Oral  PainSc: 0-No pain                 [name]      "

## 2024-04-28 ENCOUNTER — Telehealth: Payer: Self-pay | Admitting: Gastroenterology

## 2024-04-28 NOTE — Telephone Encounter (Signed)
 Not prepped well for colonoscopy.   Ladonna - Please reach out to facility to schedule non urgent office visit to discuss next steps in result of anemia.   Charmaine RAMAN - please fax prior lab orders to facility for these to be done as I have no results from previous labs that were ordered in December.   Charmaine Melia, MSN, APRN, FNP-BC, AGACNP-BC [facility]

## 2024-04-29 ENCOUNTER — Ambulatory Visit: Payer: Self-pay | Admitting: Internal Medicine

## 2024-04-29 NOTE — Telephone Encounter (Signed)
 Noted. Faxed labs to facility

## 2024-06-25 ENCOUNTER — Ambulatory Visit: Admitting: Gastroenterology
